# Patient Record
Sex: Female | Born: 1965
Health system: Southern US, Community
[De-identification: ages and names within clinical notes are randomized; demographics above are authoritative.]

## PROBLEM LIST (undated history)

## (undated) DIAGNOSIS — I10 Essential (primary) hypertension: Secondary | ICD-10-CM

## (undated) DIAGNOSIS — D649 Anemia, unspecified: Secondary | ICD-10-CM

## (undated) DIAGNOSIS — F319 Bipolar disorder, unspecified: Secondary | ICD-10-CM

## (undated) DIAGNOSIS — N186 End stage renal disease: Secondary | ICD-10-CM

## (undated) DIAGNOSIS — R269 Unspecified abnormalities of gait and mobility: Secondary | ICD-10-CM

## (undated) DIAGNOSIS — K219 Gastro-esophageal reflux disease without esophagitis: Secondary | ICD-10-CM

## (undated) DIAGNOSIS — F329 Major depressive disorder, single episode, unspecified: Secondary | ICD-10-CM

## (undated) DIAGNOSIS — E039 Hypothyroidism, unspecified: Secondary | ICD-10-CM

## (undated) DIAGNOSIS — F32A Depression, unspecified: Secondary | ICD-10-CM

## (undated) DIAGNOSIS — F209 Schizophrenia, unspecified: Secondary | ICD-10-CM

## (undated) DIAGNOSIS — E785 Hyperlipidemia, unspecified: Secondary | ICD-10-CM

## (undated) DIAGNOSIS — Z992 Dependence on renal dialysis: Secondary | ICD-10-CM

## (undated) DIAGNOSIS — R569 Unspecified convulsions: Secondary | ICD-10-CM

## (undated) DIAGNOSIS — N289 Disorder of kidney and ureter, unspecified: Secondary | ICD-10-CM

## (undated) DIAGNOSIS — F71 Moderate intellectual disabilities: Secondary | ICD-10-CM

## (undated) HISTORY — PX: BREAST SURGERY: SHX581

## (undated) HISTORY — PX: BREAST REDUCTION SURGERY: SHX8

## (undated) HISTORY — PX: FOOT SURGERY: SHX648

## (undated) HISTORY — DX: Gastro-esophageal reflux disease without esophagitis: K21.9

## (undated) HISTORY — DX: Unspecified abnormalities of gait and mobility: R26.9

## (undated) HISTORY — DX: End stage renal disease: N18.6

## (undated) HISTORY — PX: REDUCTION MAMMAPLASTY: SUR839

---

## 1998-02-23 ENCOUNTER — Ambulatory Visit: Admission: RE | Admit: 1998-02-23 | Discharge: 1998-02-23 | Payer: Self-pay | Admitting: Internal Medicine

## 1998-11-02 ENCOUNTER — Ambulatory Visit (HOSPITAL_COMMUNITY): Admission: RE | Admit: 1998-11-02 | Discharge: 1998-11-02 | Payer: Self-pay | Admitting: Obstetrics

## 2001-07-15 ENCOUNTER — Other Ambulatory Visit: Admission: RE | Admit: 2001-07-15 | Discharge: 2001-07-15 | Payer: Self-pay | Admitting: *Deleted

## 2001-07-16 ENCOUNTER — Ambulatory Visit (HOSPITAL_COMMUNITY): Admission: RE | Admit: 2001-07-16 | Discharge: 2001-07-16 | Payer: Self-pay | Admitting: Obstetrics and Gynecology

## 2002-04-19 ENCOUNTER — Emergency Department (HOSPITAL_COMMUNITY): Admission: EM | Admit: 2002-04-19 | Discharge: 2002-04-19 | Payer: Self-pay | Admitting: Emergency Medicine

## 2004-07-07 ENCOUNTER — Encounter: Admission: RE | Admit: 2004-07-07 | Discharge: 2004-07-07 | Payer: Self-pay | Admitting: Family Medicine

## 2005-07-31 ENCOUNTER — Encounter: Admission: RE | Admit: 2005-07-31 | Discharge: 2005-07-31 | Payer: Self-pay | Admitting: Family Medicine

## 2005-08-09 ENCOUNTER — Encounter: Admission: RE | Admit: 2005-08-09 | Discharge: 2005-08-09 | Payer: Self-pay | Admitting: Family Medicine

## 2006-02-21 ENCOUNTER — Encounter: Payer: Self-pay | Admitting: Obstetrics

## 2006-08-14 ENCOUNTER — Encounter: Admission: RE | Admit: 2006-08-14 | Discharge: 2006-08-14 | Payer: Self-pay | Admitting: Family Medicine

## 2006-10-30 ENCOUNTER — Ambulatory Visit: Payer: Self-pay | Admitting: Internal Medicine

## 2006-12-27 ENCOUNTER — Ambulatory Visit: Payer: Self-pay | Admitting: Internal Medicine

## 2007-01-29 ENCOUNTER — Ambulatory Visit: Payer: Self-pay | Admitting: Internal Medicine

## 2007-01-31 ENCOUNTER — Ambulatory Visit: Payer: Self-pay | Admitting: Internal Medicine

## 2007-02-19 ENCOUNTER — Ambulatory Visit: Payer: Self-pay | Admitting: Internal Medicine

## 2007-03-11 ENCOUNTER — Ambulatory Visit (HOSPITAL_COMMUNITY): Admission: RE | Admit: 2007-03-11 | Discharge: 2007-03-11 | Payer: Self-pay | Admitting: Internal Medicine

## 2007-03-20 ENCOUNTER — Ambulatory Visit: Payer: Self-pay | Admitting: Internal Medicine

## 2007-06-05 ENCOUNTER — Ambulatory Visit: Payer: Self-pay | Admitting: Obstetrics & Gynecology

## 2007-06-11 ENCOUNTER — Ambulatory Visit: Payer: Self-pay | Admitting: Internal Medicine

## 2007-06-11 DIAGNOSIS — N189 Chronic kidney disease, unspecified: Secondary | ICD-10-CM

## 2007-06-11 DIAGNOSIS — F79 Unspecified intellectual disabilities: Secondary | ICD-10-CM

## 2007-06-11 DIAGNOSIS — F259 Schizoaffective disorder, unspecified: Secondary | ICD-10-CM | POA: Insufficient documentation

## 2007-06-11 DIAGNOSIS — E785 Hyperlipidemia, unspecified: Secondary | ICD-10-CM

## 2007-06-11 DIAGNOSIS — Z9189 Other specified personal risk factors, not elsewhere classified: Secondary | ICD-10-CM | POA: Insufficient documentation

## 2007-06-11 DIAGNOSIS — F319 Bipolar disorder, unspecified: Secondary | ICD-10-CM | POA: Insufficient documentation

## 2007-06-11 DIAGNOSIS — E669 Obesity, unspecified: Secondary | ICD-10-CM

## 2007-06-11 DIAGNOSIS — L708 Other acne: Secondary | ICD-10-CM | POA: Insufficient documentation

## 2007-06-11 DIAGNOSIS — N3941 Urge incontinence: Secondary | ICD-10-CM

## 2007-07-02 ENCOUNTER — Encounter: Payer: Self-pay | Admitting: Obstetrics & Gynecology

## 2007-07-02 ENCOUNTER — Encounter (INDEPENDENT_AMBULATORY_CARE_PROVIDER_SITE_OTHER): Payer: Self-pay | Admitting: *Deleted

## 2007-07-02 ENCOUNTER — Ambulatory Visit (HOSPITAL_COMMUNITY): Admission: RE | Admit: 2007-07-02 | Discharge: 2007-07-02 | Payer: Self-pay | Admitting: Obstetrics & Gynecology

## 2007-07-02 ENCOUNTER — Ambulatory Visit: Payer: Self-pay | Admitting: *Deleted

## 2007-07-02 ENCOUNTER — Ambulatory Visit: Payer: Self-pay | Admitting: Obstetrics & Gynecology

## 2007-07-08 ENCOUNTER — Encounter (INDEPENDENT_AMBULATORY_CARE_PROVIDER_SITE_OTHER): Payer: Self-pay | Admitting: Internal Medicine

## 2007-07-09 ENCOUNTER — Encounter (INDEPENDENT_AMBULATORY_CARE_PROVIDER_SITE_OTHER): Payer: Self-pay | Admitting: Internal Medicine

## 2007-07-10 ENCOUNTER — Encounter (INDEPENDENT_AMBULATORY_CARE_PROVIDER_SITE_OTHER): Payer: Self-pay | Admitting: *Deleted

## 2007-07-10 ENCOUNTER — Encounter (INDEPENDENT_AMBULATORY_CARE_PROVIDER_SITE_OTHER): Payer: Self-pay | Admitting: Internal Medicine

## 2007-07-11 ENCOUNTER — Telehealth (INDEPENDENT_AMBULATORY_CARE_PROVIDER_SITE_OTHER): Payer: Self-pay | Admitting: Internal Medicine

## 2007-08-06 ENCOUNTER — Encounter (INDEPENDENT_AMBULATORY_CARE_PROVIDER_SITE_OTHER): Payer: Self-pay | Admitting: Internal Medicine

## 2007-08-12 ENCOUNTER — Encounter (INDEPENDENT_AMBULATORY_CARE_PROVIDER_SITE_OTHER): Payer: Self-pay | Admitting: Internal Medicine

## 2007-08-19 ENCOUNTER — Encounter: Admission: RE | Admit: 2007-08-19 | Discharge: 2007-08-19 | Payer: Self-pay | Admitting: Internal Medicine

## 2007-08-29 ENCOUNTER — Telehealth (INDEPENDENT_AMBULATORY_CARE_PROVIDER_SITE_OTHER): Payer: Self-pay | Admitting: Internal Medicine

## 2007-09-03 ENCOUNTER — Ambulatory Visit: Payer: Self-pay | Admitting: Internal Medicine

## 2007-09-03 DIAGNOSIS — R569 Unspecified convulsions: Secondary | ICD-10-CM | POA: Insufficient documentation

## 2007-09-08 ENCOUNTER — Encounter (INDEPENDENT_AMBULATORY_CARE_PROVIDER_SITE_OTHER): Payer: Self-pay | Admitting: Internal Medicine

## 2007-09-09 ENCOUNTER — Encounter (INDEPENDENT_AMBULATORY_CARE_PROVIDER_SITE_OTHER): Payer: Self-pay | Admitting: Internal Medicine

## 2007-10-24 ENCOUNTER — Encounter (INDEPENDENT_AMBULATORY_CARE_PROVIDER_SITE_OTHER): Payer: Self-pay | Admitting: Internal Medicine

## 2007-11-04 ENCOUNTER — Telehealth (INDEPENDENT_AMBULATORY_CARE_PROVIDER_SITE_OTHER): Payer: Self-pay | Admitting: Internal Medicine

## 2007-12-02 ENCOUNTER — Encounter (INDEPENDENT_AMBULATORY_CARE_PROVIDER_SITE_OTHER): Payer: Self-pay | Admitting: Internal Medicine

## 2007-12-03 ENCOUNTER — Ambulatory Visit: Payer: Self-pay | Admitting: Internal Medicine

## 2007-12-03 DIAGNOSIS — D509 Iron deficiency anemia, unspecified: Secondary | ICD-10-CM

## 2007-12-09 LAB — CONVERTED CEMR LAB
Basophils Relative: 0 % (ref 0–1)
Eosinophils Absolute: 0 10*3/uL (ref 0.0–0.7)
Eosinophils Relative: 1 % (ref 0–5)
Hemoglobin: 11.8 g/dL — ABNORMAL LOW (ref 12.0–15.0)
MCHC: 30.6 g/dL (ref 30.0–36.0)
Neutro Abs: 3.7 10*3/uL (ref 1.7–7.7)

## 2007-12-25 ENCOUNTER — Telehealth (INDEPENDENT_AMBULATORY_CARE_PROVIDER_SITE_OTHER): Payer: Self-pay | Admitting: *Deleted

## 2007-12-31 ENCOUNTER — Encounter (INDEPENDENT_AMBULATORY_CARE_PROVIDER_SITE_OTHER): Payer: Self-pay | Admitting: Internal Medicine

## 2008-01-10 ENCOUNTER — Encounter (INDEPENDENT_AMBULATORY_CARE_PROVIDER_SITE_OTHER): Payer: Self-pay | Admitting: Internal Medicine

## 2008-01-17 ENCOUNTER — Ambulatory Visit: Payer: Self-pay | Admitting: Internal Medicine

## 2008-01-20 ENCOUNTER — Ambulatory Visit: Payer: Self-pay | Admitting: Internal Medicine

## 2008-02-06 ENCOUNTER — Encounter (INDEPENDENT_AMBULATORY_CARE_PROVIDER_SITE_OTHER): Payer: Self-pay | Admitting: Internal Medicine

## 2008-03-02 ENCOUNTER — Encounter (INDEPENDENT_AMBULATORY_CARE_PROVIDER_SITE_OTHER): Payer: Self-pay | Admitting: Internal Medicine

## 2008-04-02 ENCOUNTER — Encounter (INDEPENDENT_AMBULATORY_CARE_PROVIDER_SITE_OTHER): Payer: Self-pay | Admitting: Internal Medicine

## 2008-04-16 ENCOUNTER — Ambulatory Visit: Payer: Self-pay | Admitting: Internal Medicine

## 2008-04-16 LAB — CONVERTED CEMR LAB
Alkaline Phosphatase: 62 units/L (ref 39–117)
Basophils Absolute: 0 10*3/uL (ref 0.0–0.1)
Basophils Relative: 0 % (ref 0–1)
CO2: 23 meq/L (ref 19–32)
Calcium: 9.7 mg/dL (ref 8.4–10.5)
Chloride: 105 meq/L (ref 96–112)
Cholesterol: 159 mg/dL (ref 0–200)
Eosinophils Absolute: 0.1 10*3/uL (ref 0.0–0.7)
Eosinophils Relative: 1 % (ref 0–5)
HCT: 36.9 % (ref 36.0–46.0)
HDL: 36 mg/dL — ABNORMAL LOW (ref >39)
Hemoglobin: 11.7 g/dL — ABNORMAL LOW (ref 12.0–15.0)
Lymphs Abs: 2.6 10*3/uL (ref 0.7–4.0)
Monocytes Absolute: 0.4 10*3/uL (ref 0.1–1.0)
Neutro Abs: 3.5 10*3/uL (ref 1.7–7.7)
Platelets: 286 10*3/uL (ref 150–400)
Potassium: 4 meq/L (ref 3.5–5.3)
RBC: 3.91 M/uL (ref 3.87–5.11)
Total Bilirubin: 0.3 mg/dL (ref 0.3–1.2)
Total CHOL/HDL Ratio: 4.4
Triglycerides: 118 mg/dL (ref <150)
Valproic Acid Lvl: 51.5 ug/mL (ref 50.0–100.0)

## 2008-04-30 ENCOUNTER — Encounter (INDEPENDENT_AMBULATORY_CARE_PROVIDER_SITE_OTHER): Payer: Self-pay | Admitting: Internal Medicine

## 2008-05-07 ENCOUNTER — Encounter (INDEPENDENT_AMBULATORY_CARE_PROVIDER_SITE_OTHER): Payer: Self-pay | Admitting: Internal Medicine

## 2008-06-01 ENCOUNTER — Encounter (INDEPENDENT_AMBULATORY_CARE_PROVIDER_SITE_OTHER): Payer: Self-pay | Admitting: Internal Medicine

## 2008-07-07 ENCOUNTER — Encounter (INDEPENDENT_AMBULATORY_CARE_PROVIDER_SITE_OTHER): Payer: Self-pay | Admitting: Internal Medicine

## 2008-07-09 ENCOUNTER — Emergency Department (HOSPITAL_COMMUNITY): Admission: EM | Admit: 2008-07-09 | Discharge: 2008-07-09 | Payer: Self-pay | Admitting: Family Medicine

## 2008-08-19 ENCOUNTER — Encounter: Admission: RE | Admit: 2008-08-19 | Discharge: 2008-08-19 | Payer: Self-pay | Admitting: Internal Medicine

## 2008-09-07 ENCOUNTER — Encounter (INDEPENDENT_AMBULATORY_CARE_PROVIDER_SITE_OTHER): Payer: Self-pay | Admitting: Internal Medicine

## 2008-09-09 ENCOUNTER — Encounter (INDEPENDENT_AMBULATORY_CARE_PROVIDER_SITE_OTHER): Payer: Self-pay | Admitting: Internal Medicine

## 2008-10-07 ENCOUNTER — Encounter (INDEPENDENT_AMBULATORY_CARE_PROVIDER_SITE_OTHER): Payer: Self-pay | Admitting: Internal Medicine

## 2008-10-22 ENCOUNTER — Encounter (INDEPENDENT_AMBULATORY_CARE_PROVIDER_SITE_OTHER): Payer: Self-pay | Admitting: Internal Medicine

## 2008-10-27 ENCOUNTER — Telehealth (INDEPENDENT_AMBULATORY_CARE_PROVIDER_SITE_OTHER): Payer: Self-pay | Admitting: Internal Medicine

## 2008-11-04 ENCOUNTER — Telehealth (INDEPENDENT_AMBULATORY_CARE_PROVIDER_SITE_OTHER): Payer: Self-pay | Admitting: Internal Medicine

## 2008-11-09 ENCOUNTER — Encounter (INDEPENDENT_AMBULATORY_CARE_PROVIDER_SITE_OTHER): Payer: Self-pay | Admitting: Internal Medicine

## 2008-11-22 ENCOUNTER — Encounter (INDEPENDENT_AMBULATORY_CARE_PROVIDER_SITE_OTHER): Payer: Self-pay | Admitting: Internal Medicine

## 2008-12-09 ENCOUNTER — Encounter (INDEPENDENT_AMBULATORY_CARE_PROVIDER_SITE_OTHER): Payer: Self-pay | Admitting: Internal Medicine

## 2008-12-14 ENCOUNTER — Encounter (INDEPENDENT_AMBULATORY_CARE_PROVIDER_SITE_OTHER): Payer: Self-pay | Admitting: Internal Medicine

## 2009-01-04 ENCOUNTER — Encounter (INDEPENDENT_AMBULATORY_CARE_PROVIDER_SITE_OTHER): Payer: Self-pay | Admitting: Internal Medicine

## 2009-01-11 ENCOUNTER — Encounter (INDEPENDENT_AMBULATORY_CARE_PROVIDER_SITE_OTHER): Payer: Self-pay | Admitting: Internal Medicine

## 2009-01-21 ENCOUNTER — Ambulatory Visit: Payer: Self-pay | Admitting: Internal Medicine

## 2009-01-21 LAB — CONVERTED CEMR LAB
Bilirubin Urine: NEGATIVE
Blood in Urine, dipstick: NEGATIVE
pH: 6

## 2009-01-25 ENCOUNTER — Encounter (INDEPENDENT_AMBULATORY_CARE_PROVIDER_SITE_OTHER): Payer: Self-pay | Admitting: Internal Medicine

## 2009-02-03 ENCOUNTER — Telehealth (INDEPENDENT_AMBULATORY_CARE_PROVIDER_SITE_OTHER): Payer: Self-pay | Admitting: Internal Medicine

## 2009-02-05 ENCOUNTER — Ambulatory Visit: Payer: Self-pay | Admitting: Internal Medicine

## 2009-02-05 LAB — CONVERTED CEMR LAB
ALT: 9 units/L (ref 0–35)
Albumin: 3.8 g/dL (ref 3.5–5.2)
BUN: 26 mg/dL — ABNORMAL HIGH (ref 6–23)
Basophils Relative: 1 % (ref 0–1)
CO2: 23 meq/L (ref 19–32)
Cholesterol: 123 mg/dL (ref 0–200)
Creatinine, Ser: 1.86 mg/dL — ABNORMAL HIGH (ref 0.40–1.20)
HCT: 36.8 % (ref 36.0–46.0)
LDL Cholesterol: 70 mg/dL (ref 0–99)
Lymphocytes Relative: 36 % (ref 12–46)
Lymphs Abs: 2 10*3/uL (ref 0.7–4.0)
Monocytes Absolute: 0.5 10*3/uL (ref 0.1–1.0)
Monocytes Relative: 8 % (ref 3–12)
Neutro Abs: 3.1 10*3/uL (ref 1.7–7.7)
Neutrophils Relative %: 55 % (ref 43–77)
RDW: 13.7 % (ref 11.5–15.5)
Total Bilirubin: 0.3 mg/dL (ref 0.3–1.2)
Triglycerides: 71 mg/dL (ref <150)

## 2009-02-08 ENCOUNTER — Ambulatory Visit: Payer: Self-pay | Admitting: Internal Medicine

## 2009-02-14 ENCOUNTER — Encounter (INDEPENDENT_AMBULATORY_CARE_PROVIDER_SITE_OTHER): Payer: Self-pay | Admitting: Internal Medicine

## 2009-03-23 ENCOUNTER — Telehealth (INDEPENDENT_AMBULATORY_CARE_PROVIDER_SITE_OTHER): Payer: Self-pay | Admitting: Internal Medicine

## 2009-03-25 ENCOUNTER — Encounter (INDEPENDENT_AMBULATORY_CARE_PROVIDER_SITE_OTHER): Payer: Self-pay | Admitting: Internal Medicine

## 2009-03-29 ENCOUNTER — Encounter (INDEPENDENT_AMBULATORY_CARE_PROVIDER_SITE_OTHER): Payer: Self-pay | Admitting: Internal Medicine

## 2009-04-02 ENCOUNTER — Telehealth (INDEPENDENT_AMBULATORY_CARE_PROVIDER_SITE_OTHER): Payer: Self-pay | Admitting: Internal Medicine

## 2009-04-16 ENCOUNTER — Ambulatory Visit: Payer: Self-pay | Admitting: Internal Medicine

## 2009-04-16 ENCOUNTER — Telehealth (INDEPENDENT_AMBULATORY_CARE_PROVIDER_SITE_OTHER): Payer: Self-pay | Admitting: Internal Medicine

## 2009-04-16 DIAGNOSIS — R631 Polydipsia: Secondary | ICD-10-CM

## 2009-04-16 DIAGNOSIS — R350 Frequency of micturition: Secondary | ICD-10-CM

## 2009-04-16 DIAGNOSIS — N898 Other specified noninflammatory disorders of vagina: Secondary | ICD-10-CM | POA: Insufficient documentation

## 2009-04-16 DIAGNOSIS — B353 Tinea pedis: Secondary | ICD-10-CM | POA: Insufficient documentation

## 2009-04-16 DIAGNOSIS — G4700 Insomnia, unspecified: Secondary | ICD-10-CM

## 2009-04-16 LAB — CONVERTED CEMR LAB
BUN: 23 mg/dL (ref 6–23)
Bilirubin Urine: NEGATIVE
Chlamydia, Swab/Urine, PCR: NEGATIVE
Chloride: 103 meq/L (ref 96–112)
Glucose, Bld: 79 mg/dL (ref 70–99)
Ketones, urine, test strip: NEGATIVE
Nitrite: NEGATIVE
Potassium: 4.1 meq/L (ref 3.5–5.3)
Specific Gravity, Urine: <1.005
Urobilinogen, UA: 0.2
WBC Urine, dipstick: NEGATIVE
pH: 5.5

## 2009-04-22 ENCOUNTER — Encounter (INDEPENDENT_AMBULATORY_CARE_PROVIDER_SITE_OTHER): Payer: Self-pay | Admitting: Internal Medicine

## 2009-06-17 ENCOUNTER — Ambulatory Visit: Payer: Self-pay | Admitting: Internal Medicine

## 2009-06-17 DIAGNOSIS — R635 Abnormal weight gain: Secondary | ICD-10-CM | POA: Insufficient documentation

## 2009-06-17 DIAGNOSIS — IMO0002 Reserved for concepts with insufficient information to code with codable children: Secondary | ICD-10-CM

## 2009-08-02 ENCOUNTER — Telehealth (INDEPENDENT_AMBULATORY_CARE_PROVIDER_SITE_OTHER): Payer: Self-pay | Admitting: Internal Medicine

## 2009-08-09 ENCOUNTER — Telehealth (INDEPENDENT_AMBULATORY_CARE_PROVIDER_SITE_OTHER): Payer: Self-pay | Admitting: Internal Medicine

## 2009-08-20 ENCOUNTER — Ambulatory Visit (HOSPITAL_COMMUNITY): Admission: RE | Admit: 2009-08-20 | Discharge: 2009-08-20 | Payer: Self-pay | Admitting: Internal Medicine

## 2009-09-08 ENCOUNTER — Ambulatory Visit: Payer: Self-pay | Admitting: Internal Medicine

## 2009-10-25 ENCOUNTER — Encounter: Admission: RE | Admit: 2009-10-25 | Discharge: 2010-01-23 | Payer: Self-pay | Admitting: Internal Medicine

## 2009-10-25 ENCOUNTER — Encounter (INDEPENDENT_AMBULATORY_CARE_PROVIDER_SITE_OTHER): Payer: Self-pay | Admitting: Internal Medicine

## 2009-11-30 ENCOUNTER — Ambulatory Visit: Payer: Self-pay | Admitting: Internal Medicine

## 2010-02-20 DIAGNOSIS — S93409A Sprain of unspecified ligament of unspecified ankle, initial encounter: Secondary | ICD-10-CM | POA: Insufficient documentation

## 2010-02-21 ENCOUNTER — Ambulatory Visit: Payer: Self-pay | Admitting: Internal Medicine

## 2010-03-07 ENCOUNTER — Encounter: Admission: RE | Admit: 2010-03-07 | Discharge: 2010-03-07 | Payer: Self-pay | Admitting: Podiatry

## 2010-03-16 ENCOUNTER — Telehealth (INDEPENDENT_AMBULATORY_CARE_PROVIDER_SITE_OTHER): Payer: Self-pay | Admitting: Nurse Practitioner

## 2010-04-12 ENCOUNTER — Ambulatory Visit: Payer: Self-pay | Admitting: Internal Medicine

## 2010-04-12 LAB — CONVERTED CEMR LAB
Nitrite: NEGATIVE
Urobilinogen, UA: 0.2

## 2010-04-15 ENCOUNTER — Encounter (INDEPENDENT_AMBULATORY_CARE_PROVIDER_SITE_OTHER): Payer: Self-pay | Admitting: Internal Medicine

## 2010-04-20 LAB — CONVERTED CEMR LAB
Albumin: 3.8 g/dL (ref 3.5–5.2)
Basophils Absolute: 0 10*3/uL (ref 0.0–0.1)
Basophils Relative: 0 % (ref 0–1)
CO2: 23 meq/L (ref 19–32)
Calcium: 9.8 mg/dL (ref 8.4–10.5)
Chlamydia, Swab/Urine, PCR: NEGATIVE
Chloride: 103 meq/L (ref 96–112)
Cholesterol: 203 mg/dL — ABNORMAL HIGH (ref 0–200)
GC Probe Amp, Urine: NEGATIVE
HCT: 35.2 % — ABNORMAL LOW (ref 36.0–46.0)
HDL: 36 mg/dL — ABNORMAL LOW (ref >39)
LDL Cholesterol: 124 mg/dL — ABNORMAL HIGH (ref 0–99)
Lymphocytes Relative: 39 % (ref 12–46)
Lymphs Abs: 1.9 10*3/uL (ref 0.7–4.0)
Monocytes Absolute: 0.4 10*3/uL (ref 0.1–1.0)
Monocytes Relative: 7 % (ref 3–12)
Platelets: 333 10*3/uL (ref 150–400)
Potassium: 3.8 meq/L (ref 3.5–5.3)
RBC: 3.57 M/uL — ABNORMAL LOW (ref 3.87–5.11)
RDW: 14.6 % (ref 11.5–15.5)
Sodium: 140 meq/L (ref 135–145)
Total Bilirubin: 0.3 mg/dL (ref 0.3–1.2)
Total CHOL/HDL Ratio: 5.6
Triglycerides: 215 mg/dL — ABNORMAL HIGH (ref <150)
VLDL: 43 mg/dL — ABNORMAL HIGH (ref 0–40)

## 2010-04-21 LAB — CONVERTED CEMR LAB
Iron: 96 ug/dL (ref 42–145)
Saturation Ratios: 38 % (ref 20–55)
Vitamin B-12: 398 pg/mL (ref 211–911)

## 2010-05-16 ENCOUNTER — Ambulatory Visit: Payer: Self-pay | Admitting: Internal Medicine

## 2010-05-17 ENCOUNTER — Encounter (INDEPENDENT_AMBULATORY_CARE_PROVIDER_SITE_OTHER): Payer: Self-pay | Admitting: Internal Medicine

## 2010-05-21 ENCOUNTER — Telehealth (INDEPENDENT_AMBULATORY_CARE_PROVIDER_SITE_OTHER): Payer: Self-pay | Admitting: Internal Medicine

## 2010-05-21 ENCOUNTER — Encounter (INDEPENDENT_AMBULATORY_CARE_PROVIDER_SITE_OTHER): Payer: Self-pay | Admitting: Internal Medicine

## 2010-05-21 LAB — CONVERTED CEMR LAB
BUN: 32 mg/dL — ABNORMAL HIGH (ref 6–23)
CO2: 22 meq/L (ref 19–32)
Chloride: 103 meq/L (ref 96–112)
Creatinine 24 HR UR: 1138 mg/24hr (ref 700–1800)
Creatinine Clearance: 34 mL/min — ABNORMAL LOW (ref 75–115)
Creatinine, Ser: 2.3 mg/dL — ABNORMAL HIGH (ref 0.40–1.20)
Creatinine, Urine: 35.6 mg/dL
Potassium: 3.9 meq/L (ref 3.5–5.3)

## 2010-05-25 ENCOUNTER — Encounter (INDEPENDENT_AMBULATORY_CARE_PROVIDER_SITE_OTHER): Payer: Self-pay | Admitting: Internal Medicine

## 2010-05-31 ENCOUNTER — Ambulatory Visit: Payer: Self-pay | Admitting: Internal Medicine

## 2010-06-01 ENCOUNTER — Ambulatory Visit (HOSPITAL_COMMUNITY): Admission: RE | Admit: 2010-06-01 | Discharge: 2010-06-01 | Payer: Self-pay | Admitting: Internal Medicine

## 2010-06-09 LAB — CONVERTED CEMR LAB: IgG (Immunoglobin G), Serum: 2290 mg/dL — ABNORMAL HIGH (ref 694–1618)

## 2010-07-13 ENCOUNTER — Encounter (INDEPENDENT_AMBULATORY_CARE_PROVIDER_SITE_OTHER): Payer: Self-pay | Admitting: Internal Medicine

## 2010-07-14 ENCOUNTER — Ambulatory Visit: Payer: Self-pay | Admitting: Internal Medicine

## 2010-08-02 ENCOUNTER — Telehealth (INDEPENDENT_AMBULATORY_CARE_PROVIDER_SITE_OTHER): Payer: Self-pay | Admitting: Internal Medicine

## 2010-08-09 ENCOUNTER — Ambulatory Visit: Payer: Self-pay | Admitting: Internal Medicine

## 2010-08-22 ENCOUNTER — Ambulatory Visit (HOSPITAL_COMMUNITY): Admission: RE | Admit: 2010-08-22 | Discharge: 2010-08-22 | Payer: Self-pay | Admitting: Internal Medicine

## 2010-08-29 LAB — CONVERTED CEMR LAB
ALT: <8 units/L (ref 0–35)
BUN: 33 mg/dL — ABNORMAL HIGH (ref 6–23)
CO2: 23 meq/L (ref 19–32)
Chloride: 105 meq/L (ref 96–112)
Cholesterol: 214 mg/dL — ABNORMAL HIGH (ref 0–200)
Creatinine, Ser: 2.39 mg/dL — ABNORMAL HIGH (ref 0.40–1.20)
Eosinophils Absolute: 0.1 10*3/uL (ref 0.0–0.7)
Eosinophils Relative: 2 % (ref 0–5)
Glucose, Bld: 86 mg/dL (ref 70–99)
HCT: 35 % — ABNORMAL LOW (ref 36.0–46.0)
Indirect Bilirubin: 0.1 mg/dL (ref 0.0–0.9)
Lymphocytes Relative: 39 % (ref 12–46)
MCHC: 32 g/dL (ref 30.0–36.0)
Monocytes Relative: 7 % (ref 3–12)
Neutro Abs: 3.3 10*3/uL (ref 1.7–7.7)
Neutrophils Relative %: 52 % (ref 43–77)
Platelets: 309 10*3/uL (ref 150–400)
Potassium: 4.3 meq/L (ref 3.5–5.3)
RBC: 3.7 M/uL — ABNORMAL LOW (ref 3.87–5.11)
RDW: 13.5 % (ref 11.5–15.5)
Total CHOL/HDL Ratio: 5.6
Triglycerides: 123 mg/dL (ref <150)
Valproic Acid Lvl: 49.3 ug/mL — ABNORMAL LOW (ref 50.0–100.0)
WBC: 6.3 10*3/uL (ref 4.0–10.5)

## 2010-09-12 ENCOUNTER — Telehealth (INDEPENDENT_AMBULATORY_CARE_PROVIDER_SITE_OTHER): Payer: Self-pay | Admitting: Internal Medicine

## 2010-10-04 ENCOUNTER — Ambulatory Visit: Payer: Self-pay | Admitting: Internal Medicine

## 2010-10-04 DIAGNOSIS — J209 Acute bronchitis, unspecified: Secondary | ICD-10-CM | POA: Insufficient documentation

## 2010-10-11 ENCOUNTER — Ambulatory Visit: Payer: Self-pay | Admitting: Internal Medicine

## 2010-10-11 ENCOUNTER — Encounter (INDEPENDENT_AMBULATORY_CARE_PROVIDER_SITE_OTHER): Payer: Self-pay | Admitting: Internal Medicine

## 2010-10-11 DIAGNOSIS — I10 Essential (primary) hypertension: Secondary | ICD-10-CM

## 2010-10-11 DIAGNOSIS — M79609 Pain in unspecified limb: Secondary | ICD-10-CM | POA: Insufficient documentation

## 2010-10-11 LAB — CONVERTED CEMR LAB
Calcium: 9.8 mg/dL (ref 8.4–10.5)
Chloride: 102 meq/L (ref 96–112)
Glucose, Bld: 83 mg/dL (ref 70–99)
Sodium: 140 meq/L (ref 135–145)
Uric Acid, Serum: 13.6 mg/dL — ABNORMAL HIGH (ref 2.4–7.0)

## 2010-10-12 ENCOUNTER — Encounter (INDEPENDENT_AMBULATORY_CARE_PROVIDER_SITE_OTHER): Payer: Self-pay | Admitting: Nurse Practitioner

## 2010-10-12 ENCOUNTER — Telehealth (INDEPENDENT_AMBULATORY_CARE_PROVIDER_SITE_OTHER): Payer: Self-pay | Admitting: Internal Medicine

## 2010-10-26 ENCOUNTER — Telehealth (INDEPENDENT_AMBULATORY_CARE_PROVIDER_SITE_OTHER): Payer: Self-pay | Admitting: Internal Medicine

## 2010-11-04 ENCOUNTER — Ambulatory Visit
Admission: RE | Admit: 2010-11-04 | Discharge: 2010-11-04 | Payer: Self-pay | Source: Home / Self Care | Attending: Internal Medicine | Admitting: Internal Medicine

## 2010-11-12 ENCOUNTER — Encounter: Payer: Self-pay | Admitting: Family Medicine

## 2010-11-22 NOTE — Letter (Signed)
Summary: MED/SOLUTIONS//APPROVED  MED/SOLUTIONS//APPROVED   Imported By: Roland Earl 06/08/2010 09:35:06  _____________________________________________________________________  External Attachment:    Type:   Image     Comment:   External Document

## 2010-11-22 NOTE — Progress Notes (Signed)
Summary: Nephrology Referral   Phone Note Outgoing Call   Summary of Call: Jane [name]:  Nephrology referral--order and letter written--labs to be done need to be faxed ultimately as well.  I have flagged labs and Ultrasound in paper chart to fax along as well.  Please also fax the copy of her flowsheet I placed with paper chart on your desk. Initial call taken by: Mack Hook MD,  May 21, 2010 1:29 PM  Follow-up for Phone Call        I send the Referral today [facility] with all your information .Waiting for an appt  And also pt have an appt for her Ultrasound 06-01-10 @ 1:30pm  Follow-up by: Maren Reamer,  May 25, 2010 12:03 PM

## 2010-11-22 NOTE — Assessment & Plan Note (Signed)
Summary: CPP////KT   Vital Signs:  Patient profile:   45 year old female Weight:      220 pounds BMI:     37.90 Temp:     97.5 degrees F oral Pulse rate:   87 / minute Pulse rhythm:   regular Resp:     22 per minute BP sitting:   133 / 88  (left arm) Cuff size:   large  Vitals Entered By: Shellia Carwin CMA (April 12, 2010 9:06 AM) CC: CPP Is Patient Diabetic? No  Does patient need assistance? Ambulation Normal   CC:  CPP.  History of Present Illness: 45 yo female here for CPP.  Concerns:  none.  Sister accompanies and states they have cut her juice intake back and she has lost weight.    Current Medications (verified): 1)  Depakote Er 500 Mg  Tb24 (Divalproex Sodium) .... Take 2 Tablets At Bed Time 2)  Risperdal 3 Mg  Tabs (Risperidone) .... Take 1 Tablet By Mouth Two Times A Day 3)  Cleocin-T 1 % Gel (Clindamycin Phosphate) .... Apply To Face Twice Daily 4)  Systane 0.4-0.3 %  Soln (Polyethyl Glycol-Propyl Glycol) .Marland Kitchen.. 1 Drop Each Eye Every 3 Hours As Needed 5)  Ferrous Sulfate 325 (65 Fe) Mg  Tabs (Ferrous Sulfate) .... Take 1 Tablet By Mouth Two Times Daily With 8oz of Orange Juice 6)  Chlorhexidine Gluconate 0.12 %  Soln (Chlorhexidine Gluconate) .Marland Kitchen.. 1 Tsp Swish and Spit Three Times A Day 7)  Depo-Provera 150 Mg/ml Im Susp (Medroxyprogesterone Acetate) .... Im Every 3 Months At Office 8)  Fluticasone Propionate 50 Mcg/act Susp (Fluticasone Propionate) .... 2 Sprays Each Nostril Daily 9)  Gemfibrozil 600 Mg Tabs (Gemfibrozil) .Marland Kitchen.. 1 Tab By Mouth Two Times A Day 10)  Lamisil At 1 % Crea (Terbinafine Hcl) .... Apply Two Times A Day To Feet For 14 Days or Until Feet Healed. 11)  Vesicare 5 Mg Tabs (Solifenacin Succinate) .Marland Kitchen.. 1 Tab By Mouth Daily  Allergies (verified): No Known Drug Allergies  Past History:  Past Surgical History: Reviewed history from 01/21/2009 and no changes required. 1.  Reduction Mammoplasty.  Family History: Mother, died 43:  died of breast  cancer Father, 80:  Healthy Brother, died in 24s:  possibly from unknown type cancer. Sister, 68:  Healthy Brother, 70:  healthy No children.  Social History: Lived at [facility] group home since 1990. Moved in with sister and her family end of April 2010. Currently lives with sister and her husband and their 2 children--early 37s and one of sister's grandchildren Sister runs a daycare out of her home--7 children. Deborah's [employer]. Pt participates somewhat in daycare. Never Smoked Alcohol use-no Drug use-no  Review of Systems General:  Energy is okay. Eyes:  Does not read much.  States she sees fine.  . ENT:  Denies decreased hearing. CV:  Denies chest pain or discomfort and palpitations. Resp:  Denies shortness of breath. GI:  Denies abdominal pain, bloody stools, constipation, dark tarry stools, and diarrhea. GU:  Denies discharge, dysuria, and urinary frequency; No periods--on Depo provera. MS:  Denies joint pain, joint redness, and joint swelling; Recent right sprained ankle Last week had a swollen left arm upon awakening--was swollen for 2 days--did not recall any injury.. Derm:  Denies lesion(s) and rash. Neuro:  Denies numbness, tingling, and weakness. Psych:  Denies anxiety and depression.  Physical Exam  General:  Overweight female, NAD.   Head:  Normocephalic and atraumatic without obvious abnormalities.  No apparent alopecia or balding. Eyes:  No corneal or conjunctival inflammation noted. EOMI. Perrla. Funduscopic exam benign, without hemorrhages, exudates or papilledema. Vision grossly normal. Ears:  External ear exam shows no significant lesions or deformities.  Otoscopic examination reveals clear canals, tympanic membranes are intact bilaterally without bulging, retraction, inflammation or discharge. Hearing is grossly normal bilaterally. Nose:  External nasal examination shows no deformity or inflammation. Nasal mucosa are pink and moist without lesions  or exudates. Mouth:  Oral mucosa and oropharynx without lesions or exudates.  Teeth in good repair. Neck:  No deformities, masses, or tenderness noted. Breasts:  No mass, nodules, thickening, tenderness, bulging, retraction, inflamation, nipple discharge or skin changes noted.  Surgical scars from reduction mammoplasty Lungs:  Normal respiratory effort, chest expands symmetrically. Lungs are clear to auscultation, no crackles or wheezes. Heart:  Normal rate and regular rhythm. S1 and S2 normal without gallop, murmur, click, rub or other extra sounds. Abdomen:  Bowel sounds positive,abdomen soft and non-tender without masses, organomegaly or hernias noted. Rectal:  Refused by patient Genitalia:  Refused by patient Msk:  No deformity or scoliosis noted of thoracic or lumbar spine.   Pulses:  R and L carotid,radial,femoral,dorsalis pedis and posterior tibial pulses are full and equal bilaterally Extremities:  No clubbing, cyanosis, edema, or deformity noted with normal full range of motion of all joints.   Bit of residual swelling about right ankle.  Nontender Neurologic:  No cranial nerve deficits noted. Station and gait are normal. Plantar reflexes are down-going bilaterally. DTRs are symmetrical throughout. Sensory, motor and coordinative functions appear intact. Skin:  Intact without suspicious lesions or rashes Cervical Nodes:  No lymphadenopathy noted Axillary Nodes:  No palpable lymphadenopathy Inguinal Nodes:  No significant adenopathy Psych:  Cognition and judgment appear intact. Alert and cooperative with normal attention span and concentration. No apparent delusions, illusions, hallucinations   Impression & Recommendations:  Problem # 1:  ROUTINE GYNECOLOGICAL EXAMINATION (ICD-V72.31)  Mammogram due in October. Pt. adamantly refused Pap, pelvic and rectal  Orders: T-Chlamydia  Probe, urine [id number]) T-GC Probe, urine [id number]) T-Syphilis Test (RPR) [phone]) T-HIV  Antibody  (Reflex) [id number])  Problem # 2:  CONTRACEPTIVE MANAGEMENT (ICD-V25.09) continue with Depo provera  Problem # 3:  Preventive Health Care (ICD-V70.0) Hemoccult cards x3 to return in 2 weeks Encouraged yearly flu vaccine Td up to date INcrease dairy intake and exercise  Complete Medication List: 1)  Depakote Er 500 Mg Tb24 (Divalproex sodium) .... 3 tabs by mouth at [facility]--[facility]. 2)  Risperdal 3 Mg Tabs (Risperidone) .Marland Kitchen.. 1 tab by mouth at bedtime--[facility]  [place]. 3)  Cleocin-t 1 % Gel (Clindamycin phosphate) .... Apply to face twice daily 4)  Systane 0.4-0.3 % Soln (Polyethyl glycol-propyl glycol) .Marland Kitchen.. 1 drop each eye every 3 hours as needed 5)  Ferrous Sulfate 325 (65 Fe) Mg Tabs (Ferrous sulfate) .... Take 1 tablet by mouth two times daily with 8oz of orange juice 6)  Chlorhexidine Gluconate 0.12 % Soln (Chlorhexidine gluconate) .Marland Kitchen.. 1 tsp swish and spit three times a day 7)  Depo-provera 150 Mg/ml Im Susp (Medroxyprogesterone acetate) .... Im every 3 months at office 8)  Fluticasone Propionate 50 Mcg/act Susp (Fluticasone propionate) .... 2 sprays each nostril daily 9)  Gemfibrozil 600 Mg Tabs (Gemfibrozil) .Marland Kitchen.. 1 tab by mouth two times a day 10)  Vesicare 5 Mg Tabs (Solifenacin succinate) .Marland Kitchen.. 1 tab by mouth daily 11)  Depo-provera 150 Mg/ml Susp (Medroxyprogesterone acetate) .Marland KitchenMarland KitchenMarland Kitchen 150 mg im once  every 3 months  Other Orders: UA Dipstick w/o Micro (manual) [id number]) T-Comprehensive Metabolic Panel ([id number]) T-CBC w/Diff [id number]) T-Lipid Profile [id number]) T-Valproic Acid (Depakene) [id number])  Patient Instructions: 1)  Follow up with Dr. Amil Amen in 6 months --general follow up  Preventive Care Screening  Prior Values:    PPD:  negative (01/20/2008)    Pap Smear:  Normal. (07/09/2007)    Mammogram:  ASSESSMENT: Negative - BI-RADS 1^MM DIGITAL SCREENING (08/20/2009)    Hemoccult:  Pt. refuses to cooperate  (09/07/2007)    Last Tetanus Booster:  Historical (10/30/2006)    Last Flu Shot:  given (08/23/2008)     Guaiac Cards:  never--pt. always flushed stool before could test. Colonoscopy:  never Osteoprevention:  1 cup of milk or yogurt, cheese daily.  Little in way of physical activity.  Sister trying to get her to walk. Mammogram:  Due in October 2011. SBE:  Pt. states she does perform.  Prescriptions: DEPO-PROVERA 150 MG/ML IM SUSP (MEDROXYPROGESTERONE ACETATE) Im every 3 months at office  #1 x 3   Entered and Authorized by:   Mack Hook MD   Signed by:   Mack Hook MD on 04/12/2010   Method used:   Electronically to        [facility]. # [id number]* (retail)       [address]       [place], [place]  [address]       Ph: [phone] or [phone]       Fax: [phone]   RxID:   [id number] VESICARE 5 MG TABS (SOLIFENACIN SUCCINATE) 1 tab by mouth daily  #30 x 11   Entered and Authorized by:   Mack Hook MD   Signed by:   Mack Hook MD on 04/12/2010   Method used:   Electronically to        [facility]. # [id number]* (retail)       [place]       [place], [place]  [address]       Ph: [phone] or [phone]       Fax: [phone]   RxID:   [id number] GEMFIBROZIL 600 MG TABS (GEMFIBROZIL) 1 tab by mouth two times a day  #60 Tablet x 11   Entered and Authorized by:   Mack Hook MD   Signed by:   Mack Hook MD on 04/12/2010   Method used:   Electronically to        [facility]. # [id number]* (retail)       [place]       [place], [place]  [address]       Ph: [phone] or [phone]       Fax: [phone]   RxID:   [id number] FLUTICASONE PROPIONATE 50 MCG/ACT SUSP (FLUTICASONE PROPIONATE) 2 sprays each nostril daily  #1 x 11   Entered and Authorized by:   Mack Hook MD   Signed by:   Mack Hook MD on  04/12/2010   Method used:   Electronically to        [facility]. # [id number]* (retail)       [facility]       [place], [place]  [address]       Ph: [phone] or [phone]  Fax: [phone]   [id number] CHLORHEXIDINE GLUCONATE 0.12 %  SOLN (CHLORHEXIDINE GLUCONATE) 1 tsp swish and spit three times a day  #1 month x 11   Entered and Authorized by:   Mack Hook MD   Signed by:   Mack Hook MD on 04/12/2010   Method used:   Electronically to        [facility]. # [id number]* (retail)       [facility] [place]       [place], [place]  [address]       Ph: [phone] or [phone]       Fax: [phone]   RxID:   [id number] FERROUS SULFATE 325 (65 FE) MG  TABS (FERROUS SULFATE) take 1 tablet by mouth two times daily with 8oz of orange juice  #60 x 6   Entered and Authorized by:   Mack Hook MD   Signed by:   Mack Hook MD on 04/12/2010   Method used:   Electronically to        [facility]. # [id number]* (retail)       [facility] [place]       [place], [place]  [address]       Ph: [phone] or [phone]       Fax: [phone]   RxID:   [id number] SYSTANE 0.4-0.3 %  SOLN (POLYETHYL GLYCOL-PROPYL GLYCOL) 1 drop each eye every 3 hours as needed  #1 month x 11   Entered and Authorized by:   Mack Hook MD   Signed by:   Mack Hook MD on 04/12/2010   Method used:   Electronically to        [facility]. # [id number]* (retail)       [facility]       [place], [place]  [address]       Ph: [phone] or [phone]       Fax: [phone]   RxID:   [id number] CLEOCIN-T 1 % GEL (CLINDAMYCIN PHOSPHATE) apply to face twice daily  #1 month x 11   Entered and Authorized by:   Mack Hook MD   Signed by:   Mack Hook MD on 04/12/2010   Method used:   Electronically to        [facility]. # [id number]* (retail)       [facility]       [place], [place]  [address]       Ph: [phone] or [phone]       Fax: [phone]   RxID:   [id number]   Laboratory Results   Urine Tests    Routine Urinalysis   Glucose: negative   (Normal Range: Negative) Bilirubin: negative   (Normal Range: Negative) Ketone: negative   (Normal Range: Negative) Spec. Gravity: <1.005   (Normal Range: 1.003-1.035) Blood: trace-intact   (Normal Range: Negative) pH: 6.0   (Normal Range: 5.0-8.0) Protein: 100   (Normal Range: Negative) Urobilinogen: 0.2   (Normal Range: 0-1) Nitrite: negative   (Normal Range: Negative) Leukocyte Esterace: trace   (Normal Range: Negative)       Appended Document: CPP////KT  Laboratory Results  Date/Time Received: May 17, 2010 11:56 AM   Stool - Occult Blood Hemmoccult #1: negative Date: 05/16/2010 Hemoccult #2:  negative Date: 05/16/2010 Hemoccult #3: negative Date: 05/16/2010

## 2010-11-22 NOTE — Assessment & Plan Note (Signed)
Summary: right leg problem//gk   Vital Signs:  Patient profile:   45 year old female Height:      64 inches Weight:      216 pounds BMI:     37.21 Temp:     97.0 degrees F oral Pulse rate:   88 / minute Pulse rhythm:   regular Resp:     18 per minute BP sitting:   130 / 86  (left arm) Cuff size:   large  Vitals Entered By: [place] Shannon (July 14, 2010 8:56 AM) CC: pt is okay...Marland Kitchen pt is here for a little check up.....Marland Kitchen pt says there is nothing wrong with her leg so they cancel appt Is Patient Diabetic? No Pain Assessment Patient in pain? no       Does patient need assistance? Functional Status Self care Ambulation Normal   CC:  pt is okay...Marland Kitchen pt is here for a little check up.....Marland Kitchen pt says there is nothing wrong with her leg so they cancel appt.  Allergies: No Known Drug Allergies   Complete Medication List: 1)  Depakote Er 500 Mg Tb24 (Divalproex sodium) .... 3 tabs by mouth at bed[facility]--[facility]. 2)  Risperdal 3 Mg Tabs (Risperidone) .Marland Kitchen.. 1 tab by mouth at bedtime--[facility]  [place]. 3)  Cleocin-t 1 % Gel (Clindamycin phosphate) .... Apply to face twice daily 4)  Systane 0.4-0.3 % Soln (Polyethyl glycol-propyl glycol) .Marland Kitchen.. 1 drop each eye every 3 hours as needed 5)  Ferrous Sulfate 325 (65 Fe) Mg Tabs (Ferrous sulfate) .... Take 1 tablet by mouth two times daily with 8oz of orange juice 6)  Chlorhexidine Gluconate 0.12 % Soln (Chlorhexidine gluconate) .Marland Kitchen.. 1 tsp swish and spit three times a day 7)  Depo-provera 150 Mg/ml Im Susp (Medroxyprogesterone acetate) .... Im every 3 months at office 8)  Fluticasone Propionate 50 Mcg/act Susp (Fluticasone propionate) .... 2 sprays each nostril daily 9)  Gemfibrozil 600 Mg Tabs (Gemfibrozil) .Marland Kitchen.. 1 tab by mouth two times a day 10)  Vesicare 5 Mg Tabs (Solifenacin succinate) .Marland Kitchen.. 1 tab by mouth daily 11)  Depo-provera 150 Mg/ml Susp (Medroxyprogesterone acetate) .Marland KitchenMarland KitchenMarland Kitchen 150 mg im once every 3  months

## 2010-11-22 NOTE — Progress Notes (Signed)
Summary: Needs labs  Phone Note Outgoing Call   Summary of Call: [facility] last lipid panel done 01/2009 - gemfibrozil refilled for 1 month but pt has not had lipids checked in over 1 year she needs to make a fasting lab appt Initial call taken by: Aurora Mask FNP,  Mar 16, 2010 12:21 PM  Follow-up for Phone Call        Left message on answering machine to return call. Sherian Maroon RN  Mar 17, 2010 11:54 AM

## 2010-11-22 NOTE — Progress Notes (Signed)
Summary: HAS  A COUGH  Phone Note Other Incoming   [facility]: Jane [name] PT. MS BROC[facility] SISTER CALLED TO SAYS THAT Jane [name] HAS HAD A COUGH FOR A WEEK AND  SHE HAS TRIED OVER THE COUNTER MEDS AND NOTHING HAS WORKED. SHE USES [facility] ON [address]. Initial call taken by: Roberto Scales,  September 12, 2010 10:22 AM  Follow-up for Phone Call        Coughing for 2 wks. dry cough, worse @ night. Otherwise feels fine. Has tried Robitussin with no relief. Advised to drink plenty of water, use her fluticisone daily as prescribed. May try OTC allergy medication. Shower directing warm water over sinus areas. Elevate HOB. Call if develops fever, coughing up or nasal D/C greenish yellow or feels worse. Follow-up by: Bridgett Larsson RN,  September 12, 2010 5:37 PM

## 2010-11-22 NOTE — Progress Notes (Signed)
Summary: Rx order   Phone Note Call from Patient Call back at [id number]   Summary of Call: Pt has a  mamogram appointment at [facility] hospital her app is 9:00 am on october 31. please fax the order Initial call taken by: Trula Ore,  August 02, 2010 2:23 PM  Follow-up for Phone Call        order faxed Follow-up by: Rhunette Croft,  August 03, 2010 12:11 PM

## 2010-11-22 NOTE — Letter (Signed)
Summary: *Referral Letter  [facility]  [address] [place],  [place] [address]   Phone: [phone]  Fax: [phone]    05/21/2010  Thank you in advance for agreeing to see my patient:  Jane [name] [address] [place], [place]  [address]  Phone: [phone]  Reason for Referral: Worsening in last year of chronic renal insufficiency.  Elevated creatinine dates back to at least 2005, with Creatinine ranging from 1.7/1.9 range since.  Jane [name]  was living in a group home at the time and eventually established at [facility] beginning of 2008, where her elevated creatinine was noted.  Renal ultrasound in 2008 did not show obstruction, but consistent with intrinsic renal disease.  Unable to get 24 hour urine collection while living in group home and creatinine remained fairly stable.  At recent physical, creatinine noted to jump from one year ago to 2.5.  Now living with sister and seems to be thriving there.  Was able to collect urine for 24 hour studies with enclosed results--CreaCl in 34 range with some mild proteinuria.  UAs have been normal in past, though trace blood with last UA at physical.  ANA, sed rate, serum immunoelectrophoresis and repeat renal and collecting system ultrasound ordered and pending.  Has mild chronic anemia as well.  Have asked sister to allow pt. to drink water more(history of polydipsia) and avoid NSAIDS--no history of her receiving the latter.  Procedures Requested: Evaluation and recommendation for follow up  Current Medical Problems: 1)  ENCOUNTER FOR LONG-TERM USE OF OTHER MEDICATIONS (ICD-V58.69) 2)  ANKLE SPRAIN, RIGHT (ICD-845.00) 3)  ROUTINE GYNECOLOGICAL EXAMINATION (ICD-V72.31) 4)  CONTRACEPTIVE MANAGEMENT (ICD-V25.09) 5)  WEIGHT GAIN (ICD-783.1) 6)  AGITATION (ICD-307.9) 7)  POLYDIPSIA (ICD-783.5) 8)  TINEA PEDIS (ICD-110.4) 9)  DERMATOPHYTOSIS OF FOOT (ICD-110.4) 10)  INSOMNIA (ICD-780.52) 11)  FREQUENCY,  URINARY (ICD-788.41) 12)  VAGINAL DISCHARGE (ICD-623.5) 13)  DECREASED HEARING, LEFT (ICD-389.9) 14)  HEALTH MAINTENANCE EXAM (ICD-V70.0) 15)  RIGHT EYELID LESION (ICD-238.2) 16)  SCREENING EXAMINATION FOR PULMONARY TUBERCULOSIS (ICD-V74.1) 17)  ANEMIA, IRON DEFICIENCY (ICD-280.9) 18)  SEIZURE DISORDER (ICD-780.39) 19)  REDUCTION MAMMOPLASTY, HX OF (ICD-V15.9) 20)  OBESITY NOS (ICD-278.00) 21)  ACNE NEC (ICD-706.1) 22)  DYSLIPIDEMIA (ICD-272.4) 23)  URINARY INCONTINENCE, URGE (ICD-788.31) 24)  RENAL INSUFFICIENCY, CHRONIC (ICD-585.9) 25)  DISORDER, BIPOLAR NOS (ICD-296.80) 26)  DISORDER, SCHIZOAFFECTIVE, UNSPC (ICD-295.70) 27)  RETARDATION, MENTAL NOS (ICD-319)   Current Medications: 1)  DEPAKOTE ER 500 MG  TB24 (DIVALPROEX SODIUM) 3 tabs by mouth at bedtime--[facility] 2)  RISPERDAL 3 MG  TABS (RISPERIDONE) 1 tab by mouth at bedtime--[facility] [place]. 3)  CLEOCIN-T 1 % GEL (CLINDAMYCIN PHOSPHATE) apply to face twice daily 4)  SYSTANE 0.4-0.3 %  SOLN (POLYETHYL GLYCOL-PROPYL GLYCOL) 1 drop each eye every 3 hours as needed 5)  FERROUS SULFATE 325 (65 FE) MG  TABS (FERROUS SULFATE) take 1 tablet by mouth two times daily with 8oz of orange juice 6)  CHLORHEXIDINE GLUCONATE 0.12 %  SOLN (CHLORHEXIDINE GLUCONATE) 1 tsp swish and spit three times a day 7)  DEPO-PROVERA 150 MG/ML IM SUSP (MEDROXYPROGESTERONE ACETATE) Im every 3 months at office 8)  FLUTICASONE PROPIONATE 50 MCG/ACT SUSP (FLUTICASONE PROPIONATE) 2 sprays each nostril daily 9)  GEMFIBROZIL 600 MG TABS (GEMFIBROZIL) 1 tab by mouth two times a day 10)  VESICARE 5 MG TABS (SOLIFENACIN SUCCINATE) 1 tab by mouth daily 11)  DEPO-PROVERA 150 MG/ML SUSP (MEDROXYPROGESTERONE ACETATE) 150 mg IM once every 3 months  Past Medical History: 1)  RIGHT EYELID LESION (ICD-238.2) 2)  SCREENING EXAMINATION FOR PULMONARY TUBERCULOSIS (ICD-V74.1) 3)  ANEMIA, IRON DEFICIENCY (ICD-280.9) 4)  SEIZURE  DISORDER (ICD-780.39) 5)  REDUCTION MAMMOPLASTY, HX OF (ICD-V15.9) 6)  OBESITY NOS (ICD-278.00) 7)  ACNE NEC (ICD-706.1) 8)  DYSLIPIDEMIA (ICD-272.4) 9)  URINARY INCONTINENCE, URGE (ICD-788.31) 10)  RENAL INSUFFICIENCY, CHRONIC (ICD-585.9) 11)  DISORDER, BIPOLAR NOS (ICD-296.80) 12)  DISORDER, SCHIZOAFFECTIVE, UNSPC (ICD-295.70) 13)  RETARDATION, MENTAL NOS (ICD-319)   Prior History of Blood Transfusions:   Pertinent Labs:    Thank you again for agreeing to see our patient; please contact us if you have any further questions or need additional information.  Sincerely,  Mack Hook MD

## 2010-11-22 NOTE — Letter (Signed)
Summary: NUTRITION & DIABETES  NUTRITION & DIABETES   Imported By: Roland Earl 12/10/2009 12:09:16  _____________________________________________________________________  External Attachment:    Type:   Image     Comment:   External Document

## 2010-11-24 NOTE — Assessment & Plan Note (Signed)
Summary: 6 MO F/U AND CONTINUED COUGH///RJE   Vital Signs:  Patient profile:   45 year old female Menstrual status:  Depo Weight:      220.13 pounds Temp:     96.6 degrees F oral Pulse rate:   102 / minute BP sitting:   144 / 90  (left arm) Cuff size:   regular  Vitals Entered By: Aquilla Solian CMA (October 04, 2010 3:23 PM) CC: 6 month f/u from 04/12/10 for a general f/u. Concerned about a cough x2 month. Runny nose and some congestion associated w/cough.  Is Patient Diabetic? No Pain Assessment Patient in pain? no       Does patient need assistance? Ambulation Normal     Menstrual Status Depo Last PAP Result Normal.   CC:  6 month f/u from 04/12/10 for a general f/u. Concerned about a cough x2 month. Runny nose and some congestion associated w/cough. .  History of Present Illness: 1.  Cough for 2 months:  Sister states sounded like something draining down her throat.  Sounds strangled at night.  No fever.  Cough is nonproductive.  Thick  white mucouse from nose for past 2 weeks.  Throat does hurt.  Ears do not hurt.  Has complained of head hurting and sounds like may have some sinus congestion.  Has been sneezing.    Current Medications (verified): 1)  Depakote Er 500 Mg  Tb24 (Divalproex Sodium) .... 3 Tabs By Mouth At [employer]. 2)  Risperdal 3 Mg  Tabs (Risperidone) .Marland Kitchen.. 1 Tab By Mouth At [facility]. 3)  Cleocin-T 1 % Gel (Clindamycin Phosphate) .... Apply To Face Twice Daily 4)  Systane 0.4-0.3 %  Soln (Polyethyl Glycol-Propyl Glycol) .Marland Kitchen.. 1 Drop Each Eye Every 3 Hours As Needed 5)  Ferrous Sulfate 325 (65 Fe) Mg  Tabs (Ferrous Sulfate) .... Take 1 Tablet By Mouth Two Times Daily With 8oz of Orange Juice 6)  Chlorhexidine Gluconate 0.12 %  Soln (Chlorhexidine Gluconate) .Marland Kitchen.. 1 Tsp Swish and Spit Three Times A Day 7)  Depo-Provera 150 Mg/ml Im Susp (Medroxyprogesterone Acetate) .... Im Every 3 Months At Office 8)   Fluticasone Propionate 50 Mcg/act Susp (Fluticasone Propionate) .... 2 Sprays Each Nostril Daily 9)  Gemfibrozil 600 Mg Tabs (Gemfibrozil) .Marland Kitchen.. 1 Tab By Mouth Two Times A Day 10)  Vesicare 5 Mg Tabs (Solifenacin Succinate) .Marland Kitchen.. 1 Tab By Mouth Daily 11)  Depo-Provera 150 Mg/ml Susp (Medroxyprogesterone Acetate) .Marland KitchenMarland KitchenMarland Kitchen 150 Mg Im Once Every 3 Months  Allergies (verified): No Known Drug Allergies  Physical Exam  General:  NAD Eyes:  No corneal or conjunctival inflammation noted. EOMI. Perrla. Funduscopic exam benign, without hemorrhages, exudates or papilledema. Vision grossly normal. Ears:  External ear exam shows no significant lesions or deformities.  Otoscopic examination reveals clear canals, tympanic membranes are intact bilaterally without bulging, retraction, inflammation or discharge. Hearing is grossly normal bilaterally. Nose:  Clear dishcarge with swollen turbinates Mouth:  pharynx pink and moist.   Neck:  No deformities, masses, or tenderness noted. Lungs:  Scattered mild wheezes.  No crackles Abdomen:  soft and non-tender.     Impression & Recommendations:  Problem # 1:  BRONCHITIS, ACUTE WITH MILD BRONCHOSPASM (ICD-466.0) Push fluids Her updated medication list for this problem includes:    Azithromycin 250 Mg Tabs (Azithromycin) .Marland Kitchen... 2 tabs by mouth today, then 1 tab daily for 4 more days    Proventil Hfa 108 (90 Base) Mcg/act Aers (Albuterol sulfate) .Marland KitchenMarland KitchenMarland KitchenMarland Kitchen 2  puffs every 4 hours as needed for wheeze  Complete Medication List: 1)  Depakote Er 500 Mg Tb24 (Divalproex sodium) .... 3 tabs by mouth at bedtime--[facility]--[facility] carter. 2)  Risperdal 3 Mg Tabs (Risperidone) .Marland Kitchen.. 1 tab by mouth at bedtime--[facility]  [place]. 3)  Cleocin-t 1 % Gel (Clindamycin phosphate) .... Apply to face twice daily 4)  Systane 0.4-0.3 % Soln (Polyethyl glycol-propyl glycol) .Marland Kitchen.. 1 drop each eye every 3 hours as needed 5)  Ferrous Sulfate 325 (65 Fe) Mg Tabs (Ferrous  sulfate) .... Take 1 tablet by mouth two times daily with 8oz of orange juice 6)  Chlorhexidine Gluconate 0.12 % Soln (Chlorhexidine gluconate) .Marland Kitchen.. 1 tsp swish and spit three times a day 7)  Depo-provera 150 Mg/ml Im Susp (Medroxyprogesterone acetate) .... Im every 3 months at office 8)  Fluticasone Propionate 50 Mcg/act Susp (Fluticasone propionate) .... 2 sprays each nostril daily 9)  Gemfibrozil 600 Mg Tabs (Gemfibrozil) .Marland Kitchen.. 1 tab by mouth two times a day 10)  Vesicare 5 Mg Tabs (Solifenacin succinate) .Marland Kitchen.. 1 tab by mouth daily 11)  Depo-provera 150 Mg/ml Susp (Medroxyprogesterone acetate) .Marland KitchenMarland KitchenMarland Kitchen 150 mg im once every 3 months 12)  Azithromycin 250 Mg Tabs (Azithromycin) .... 2 tabs by mouth today, then 1 tab daily for 4 more days 13)  Cetirizine Hcl 10 Mg Tabs (Cetirizine hcl) .Marland Kitchen.. 1 tab by mouth daily for allergies 14)  Proventil Hfa 108 (90 Base) Mcg/act Aers (Albuterol sulfate) .... 2 puffs every 4 hours as needed for wheeze  Other Orders: Flu Vaccine 54yrs + [id number]) Admin 1st Vaccine [id number])  Patient Instructions: 1)  Keep appt. next week with Dr. Amil Amen Prescriptions: PROVENTIL HFA 108 (90 BASE) MCG/ACT AERS (ALBUTEROL SULFATE) 2 puffs every 4 hours as needed for wheeze  #1 x 0   Entered and Authorized by:   Mack Hook MD   Signed by:   Mack Hook MD on 10/04/2010   Method used:   Electronically to        [facility]. # [id number]* (retail)       [address]       [place], [place]  [address]       Ph: [phone] or [phone]       Fax: [phone]   RxID:   [id number] CETIRIZINE HCL 10 MG TABS (CETIRIZINE HCL) 1 tab by mouth daily for allergies  #30 x 6   Entered and Authorized by:   Mack Hook MD   Signed by:   Mack Hook MD on 10/04/2010   Method used:   Electronically to        [facility]. # [id number]* (retail)       [address]       [place], [place]  [address]       Ph:  [phone] or [phone]       Fax: [phone]   RxID:   [id number] AZITHROMYCIN 250 MG TABS (AZITHROMYCIN) 2 tabs by mouth today, then 1 tab daily for 4 more days  #6 x 0   Entered and Authorized by:   Mack Hook MD   Signed by:   Mack Hook MD on 10/04/2010   Method used:   Electronically to        [facility]. # [id number]* (retail)       [address]  [place], [place]  [address]       Ph: [phone] or [phone]       Fax: [phone]   RxID:   [id number]    Orders Added: 1)  Flu Vaccine 44yrs + [id number] 2)  Admin 1st Vaccine [id number] 3)  Est. Patient Level III [id number]   Immunizations Administered:  Influenza Vaccine # 1:    Vaccine Type: Fluvax 3+    Site: right deltoid    Mfr: GlaxoSmithKline    Dose: 0.5 ml    Route: IM    Given by: Aquilla Solian CMA    Exp. Date: 04/22/2011    Lot #: [id number]    VIS given: 05/17/10 version given October 04, 2010.  Flu Vaccine Consent Questions:    Do you have a history of severe allergic reactions to this vaccine? no    Any prior history of allergic reactions to egg and/or gelatin? no    Do you have a sensitivity to the preservative Thimersol? no    Do you have a past history of Guillan-Barre Syndrome? no    Do you currently have an acute febrile illness? no    Have you ever had a severe reaction to latex? no    Vaccine information given and explained to patient? yes    Are you currently pregnant? no   Immunizations Administered:  Influenza Vaccine # 1:    Vaccine Type: Fluvax 3+    Site: right deltoid    Mfr: GlaxoSmithKline    Dose: 0.5 ml    Route: IM    Given by: Aquilla Solian CMA    Exp. Date: 04/22/2011    Lot #: [id number]    VIS given: 05/17/10 version given October 04, 2010.

## 2010-11-24 NOTE — Progress Notes (Signed)
Summary: Change of clindamycin topical  Phone Note Outgoing Call   Summary of Call: Per pharmacy, pt. is refusing clindamycin gel -- wants solution.  Do you want to change Rx back? Initial call taken by: Sherian Maroon RN,  October 26, 2010 5:23 PM  Follow-up for Phone Call        Changed Notify pt's caregivers Follow-up by: Mack Hook MD,  October 27, 2010 9:47 AM  Additional Follow-up for Phone Call Additional follow up Details #1::        Sister notified.  Additional Follow-up by: Bridgett Larsson RN,  October 27, 2010 3:25 PM    New/Updated Medications: CLEOCIN-T 1 % SOLN (CLINDAMYCIN PHOSPHATE) apply to face two times a day Prescriptions: CLEOCIN-T 1 % SOLN (CLINDAMYCIN PHOSPHATE) apply to face two times a day  #1 month x 11   Entered and Authorized by:   Mack Hook MD   Signed by:   Mack Hook MD on 10/27/2010   Method used:   Electronically to        [facility]. # [id number]* (retail)       [address]       [place], [place]  [address]       Ph: [phone] or [phone]       Fax: [phone]   RxID:   [id number]

## 2010-11-24 NOTE — Assessment & Plan Note (Signed)
Summary: 6 month fu////kt   Vital Signs:  Patient profile:   45 year old female Menstrual status:  Depo  Weight:      218.56 pounds BMI:     37.65 Temp:     96.6 degrees F oral Pulse rate:   96 / minute Pulse rhythm:   regular Resp:     18 per minute BP sitting:   148 / 108  (left arm) Cuff size:   regular  Vitals Entered By: Aquilla Solian CMA (October 11, 2010 8:38 AM) CC: f/u from 10/04/10. Cough has not gone away.      Menstrual Status Depo  Last PAP Result Normal.   CC:  f/u from 10/04/10. Cough has not gone away. .  History of Present Illness: 1.  Acute bronchitis with bronchospasm:  Doing better, but sister states still coughing.  Mucous production better.  Using rescue inhaler only two times a day --not using when having coughing fits.  No fever.  Eating and drinking okay.  Still winded when walking up stairs.  2.  Sore right foot.  Started this morning.  No history of injury that sister is aware of.  No new shoes recently.  Sister states the soreness flairs in same area every couple of months.  Generally lasts for weeks.  Can be so painful that she is unwilling to get out of bed.  Father with gout.   3.  Elevated BP:  noted last visit and today.  No otc cold remedies.      Current Medications (verified): 1)  Depakote Er 500 Mg  Tb24 (Divalproex Sodium) .... 3 Tabs By Mouth At [employer]. 2)  Risperdal 3 Mg  Tabs (Risperidone) .Marland Kitchen.. 1 Tab By Mouth At [facility]. 3)  Cleocin-T 1 % Gel (Clindamycin Phosphate) .... Apply To Face Twice Daily 4)  Systane 0.4-0.3 %  Soln (Polyethyl Glycol-Propyl Glycol) .Marland Kitchen.. 1 Drop Each Eye Every 3 Hours As Needed 5)  Ferrous Sulfate 325 (65 Fe) Mg  Tabs (Ferrous Sulfate) .... Take 1 Tablet By Mouth Two Times Daily With 8oz of Orange Juice 6)  Chlorhexidine Gluconate 0.12 %  Soln (Chlorhexidine Gluconate) .Marland Kitchen.. 1 Tsp Swish and Spit Three Times A Day 7)  Depo-Provera 150 Mg/ml Im Susp  (Medroxyprogesterone Acetate) .... Im Every 3 Months At Office 8)  Fluticasone Propionate 50 Mcg/act Susp (Fluticasone Propionate) .... 2 Sprays Each Nostril Daily 9)  Gemfibrozil 600 Mg Tabs (Gemfibrozil) .Marland Kitchen.. 1 Tab By Mouth Two Times A Day 10)  Vesicare 5 Mg Tabs (Solifenacin Succinate) .Marland Kitchen.. 1 Tab By Mouth Daily 11)  Depo-Provera 150 Mg/ml Susp (Medroxyprogesterone Acetate) .Marland KitchenMarland KitchenMarland Kitchen 150 Mg Im Once Every 3 Months 12)  Azithromycin 250 Mg Tabs (Azithromycin) .... 2 Tabs By Mouth Today, Then 1 Tab Daily For 4 More Days 13)  Cetirizine Hcl 10 Mg Tabs (Cetirizine Hcl) .Marland Kitchen.. 1 Tab By Mouth Daily For Allergies 14)  Proventil Hfa 108 (90 Base) Mcg/act Aers (Albuterol Sulfate) .... 2 Puffs Every 4 Hours As Needed For Wheeze  Allergies (verified): No Known Drug Allergies  Physical Exam  General:  overweight, NAD Ears:  External ear exam shows no significant lesions or deformities.  Otoscopic examination reveals clear canals, tympanic membranes are intact bilaterally without bulging, retraction, inflammation or discharge. Hearing is grossly normal bilaterally. Nose:  Clear discharge with pale, boggy turbinates.   Mouth:  pharynx pink and moist.   Neck:  No deformities, masses, or tenderness noted. Lungs:  Normal respiratory effort,  chest expands symmetrically. Lungs are clear to auscultation, no crackles or wheezes. Heart:  Normal rate and regular rhythm. S1 and S2 normal without gallop, murmur, click, rub or other extra sounds. Extremities:  Right foot with possibly some mild swelling above medial arch.  Tender to touch in the 3 cm area.  Really just over soft tissue.  Pt. unwilling to move foot/ankle, but bears weight without obvious difficulty.   Impression & Recommendations:  Problem # 1:  BRONCHITIS, ACUTE WITH MILD BRONCHOSPASM (ICD-466.0) Much improved To use inhaler for coughing fits and  continue allergy meds. Her updated medication list for this problem includes:    Azithromycin 250 Mg Tabs  (Azithromycin) .Marland Kitchen... 2 tabs by mouth today, then 1 tab daily for 4 more days    Proventil Hfa 108 (90 Base) Mcg/act Aers (Albuterol sulfate) .Marland Kitchen... 2 puffs every 4 hours as needed for wheeze  Problem # 2:  HYPERTENSION (ICD-401.9) Start Metoprolol Discussed need to protect kidneys from repeat injury Her updated medication list for this problem includes:    Metoprolol Succinate 25 Mg Xr24h-tab (Metoprolol succinate) .Marland Kitchen... 1 tab by mouth daily  Problem # 3:  FOOT PAIN, RIGHT (ICD-729.5) ?possible early gout Orders: T-Uric Acid (Blood) [id number])  Problem # 4:  RENAL INSUFFICIENCY, CHRONIC (ICD-585.9) As in hypertension. Orders: T-Basic Metabolic Panel (99991111)  Complete Medication List: 1)  Depakote Er 500 Mg Tb24 (Divalproex sodium) .... 3 tabs by mouth at bedtime--[facility] car[facility]. 2)  Risperdal 3 Mg Tabs (Risperidone) .Marland Kitchen.. 1 tab by mouth at bedtime--[facility]  [place]. 3)  Cleocin-t 1 % Gel (Clindamycin phosphate) .... Apply to face twice daily 4)  Systane 0.4-0.3 % Soln (Polyethyl glycol-propyl glycol) .Marland Kitchen.. 1 drop each eye every 3 hours as needed 5)  Ferrous Sulfate 325 (65 Fe) Mg Tabs (Ferrous sulfate) .... Take 1 tablet by mouth two times daily with 8oz of orange juice 6)  Chlorhexidine Gluconate 0.12 % Soln (Chlorhexidine gluconate) .Marland Kitchen.. 1 tsp swish and spit three times a day 7)  Depo-provera 150 Mg/ml Im Susp (Medroxyprogesterone acetate) .... Im every 3 months at office 8)  Fluticasone Propionate 50 Mcg/act Susp (Fluticasone propionate) .... 2 sprays each nostril daily 9)  Gemfibrozil 600 Mg Tabs (Gemfibrozil) .Marland Kitchen.. 1 tab by mouth two times a day 10)  Vesicare 5 Mg Tabs (Solifenacin succinate) .Marland Kitchen.. 1 tab by mouth daily 11)  Depo-provera 150 Mg/ml Susp (Medroxyprogesterone acetate) .Marland KitchenMarland KitchenMarland Kitchen 150 mg im once every 3 months 12)  Azithromycin 250 Mg Tabs (Azithromycin) .... 2 tabs by mouth today, then 1 tab daily for 4 more days 13)  Cetirizine Hcl 10 Mg  Tabs (Cetirizine hcl) .Marland Kitchen.. 1 tab by mouth daily for allergies 14)  Proventil Hfa 108 (90 Base) Mcg/act Aers (Albuterol sulfate) .... 2 puffs every 4 hours as needed for wheeze 15)  Metoprolol Succinate 25 Mg Xr24h-tab (Metoprolol succinate) .Marland Kitchen.. 1 tab by mouth daily  Patient Instructions: 1)  Keep appt. Jan 9 for bp check 2)  Use inhaler if coughing a lot. 3)  Push water. 4)  Tylenol 1000 mg two times a day for foot pain until get labs back Prescriptions: METOPROLOL SUCCINATE 25 MG XR24H-TAB (METOPROLOL SUCCINATE) 1 tab by mouth daily  #30 x 11   Entered and Authorized by:   Mack Hook MD   Signed by:   Mack Hook MD on 10/11/2010   Method used:   Electronically to        [facility]. # [id number]* (retail)  [address]       [place], [place]  [address]       Ph: [phone] or [phone]       Fax: [phone]   RxID:   [id number]    Orders Added: 1)  T-Uric Acid (Blood) [[id number]] 2)  T-Basic Metabolic Panel 0000000 3)  Est. Patient Level III [id number]

## 2010-11-24 NOTE — Progress Notes (Signed)
Summary: New Rx for Colcrys and PA approval  Phone Note Call from Patient   Summary of Call: [facility] Jenel Lucks uric acid level is 13.6 the test was repeated and verfied Initial call taken by: [place] Shannon,  October 12, 2010 9:47 AM  Follow-up for Phone Call        Please call pt's sister--make sure she is drinking 6 glasses of water daily--may be a bit dry. Also, it does appear she may have gout.   Will call in colcrys for this. She may take it every 2 hours as long as having pain --if starts with abdominal discomfort or diarrhea, needs to stop giving every 2 hours and just go to two times a day dosing. Will see how she's doing when comes in for bp follow up in January Theresa--can you call for preauthorization. She is unable to take NSAIDS secondary to chronic renal failure and currently having a flair so cannot give Allopurinol.  Follow-up by: Mack Hook MD,  October 12, 2010 11:33 AM  Additional Follow-up for Phone Call Additional follow up Details #1::        Spoke with pt.'s sister and advised of pt.'s need to hydrate more and new Rx with instructions for use.  Will call for PA approval.  Sherian Maroon RN  October 12, 2010 12:13 PM  Spoke with Medicaid -- states that the pharmacy should have no trouble running this through -- that it comes up with no PA needed.  Sherian Maroon RN  October 12, 2010 1:00 PM  Thanks  Mack Hook MD  October 12, 2010 2:42 PM      New/Updated Medications: COLCRYS 0.6 MG TABS (COLCHICINE) 1 tab by mouth every 2 hours as long as with foot pain for first day, then 1 tab by mouth two times a day Prescriptions: PROVENTIL HFA 108 (90 BASE) MCG/ACT AERS (ALBUTEROL SULFATE) 2 puffs every 4 hours as needed for wheeze  #1 x 0   Entered and Authorized by:   Mack Hook MD   Signed by:   Mack Hook MD on 10/12/2010   Method used:   Electronically to        [facility]. # [id number]* (retail)       [address]       [place], [place]  [address]       Ph: [phone] or [phone]       Fax: [phone]   RxID:   [id number] COLCRYS 0.6 MG TABS (COLCHICINE) 1 tab by mouth every 2 hours as long as with foot pain for first day, then 1 tab by mouth two times a day  #60 x 1   Entered and Authorized by:   Mack Hook MD   Signed by:   Mack Hook MD on 10/12/2010   Method used:   Electronically to        [facility]. # [id number]* (retail)       [address]       [place], [place]  [address]       Ph: [phone] or [phone]       Fax: [phone]   RxID:   [id number]

## 2010-11-24 NOTE — Miscellaneous (Signed)
Summary: Med change  Clinical Lists Changes Note from pharmacy - unappropriate dose  -please refer to PI -max 0.6mg  two times a day for flare 0.3mg  if severe renal impairment Medications: Changed medication from COLCRYS 0.6 MG TABS (COLCHICINE) 1 tab by mouth every 2 hours as long as with foot pain for first day, then 1 tab by mouth two times a day to COLCRYS 0.6 MG TABS (COLCHICINE) One- half tablet by mouth two times a day for foot pain - Signed Rx of COLCRYS 0.6 MG TABS (COLCHICINE) One- half tablet by mouth two times a day for foot pain;  #30 x 0;  Signed;  Entered by: Aurora Mask FNP;  Authorized by: Aurora Mask FNP;  Method used: Electronically to [facility]. # [id number]*, [address] [place], [place], [place]  [address], Ph: [phone] or [phone], Fax: [phone]    Prescriptions: COLCRYS 0.6 MG TABS (COLCHICINE) One- half tablet by mouth two times a day for foot pain  #30 x 0   Entered and Authorized by:   Aurora Mask FNP   Signed by:   Aurora Mask FNP on 10/12/2010   Method used:   Electronically to        [facility]. # [id number]* (retail)       [address]       [place], [place]  [address]       Ph: [phone] or [phone]       Fax: [phone]   RxID:   [id number]

## 2011-01-03 NOTE — Letter (Signed)
Summary: [facility]  [facility]   Imported By: Roland Earl 12/26/2010 15:36:44  _____________________________________________________________________  External Attachment:    Type:   Image     Comment:   External Document

## 2011-03-07 NOTE — Op Note (Signed)
Jane [name], Jane [name] NO.:  [id number]   MEDICAL RECORD NO.:  [id number]          PATIENT TYPE:  AMB   LOCATION:                                FACILITY:  [facility]   PHYSICIAN:  Guss Bunde, M.D. DATE OF BIRTH:  1966-02-26   DATE OF PROCEDURE:  07/02/2007  DATE OF DISCHARGE:                               OPERATIVE REPORT   PREOPERATIVE DIAGNOSIS:  Mentally retarded female unable to tolerate a  pelvic exam or Pap smear.   POSTOPERATIVE DIAGNOSIS:  Mentally retarded female unable to tolerate a  pelvic exam or Pap smear.   PROCEDURE:  Pelvic exam with Pap smear under anesthesia.   SURGEON:  Dr. Silas Sacramento.   ASSISTANT:  Dr. Brooke Pace.   ANESTHESIA:  General.   SPECIMEN:  Pap smear with HPV typing.  Specimens sent to cytopathology.   ESTIMATED BLOOD LOSS:  Minimal.   COMPLICATIONS:  None.   PROCEDURE:  The patient was taken to the operating room where she was  placed under general anesthesia.  She was then placed in the dorsal  lithotomy position.  Her external genitalia appeared normal.  The metal  speculum was inserted into the vaginal vault.  Vaginal mucosa appears  normal.  Cervix appears normal.  Pap smear obtained with spatula and  brush and placed in specimen vial.  The metal speculum was removed.  A  bimanual exam was done with the uterus and the adnexa unable to fully  appreciate secondary to the patient's body habitus. The specimen will be  sent to cytopathology.  The patient was taken to the recovery room in  stable condition     ______________________________  Audry Pili, MD      Guss Bunde, M.D.  Electronically Signed    LNJ/MEDQ  D:  07/02/2007  T:  07/03/2007  Job:  [id number]

## 2011-03-07 NOTE — Group Therapy Note (Signed)
Jane [name], BARRIONUEVO NO.:  [id number]   MEDICAL RECORD NO.:  [id number]          PATIENT TYPE:  WOC   LOCATION:  [facility]                   FACILITY:  [facility]   PHYSICIAN:  Silas Sacramento, MD      DATE OF BIRTH:  [birth year]   DATE OF SERVICE:  06/05/2007                                  CLINIC NOTE   The patient is a 45 year old nulliparous patient who lives in a group  home who is here for a Pap smear.  The patient to their knowledge has  never been sexually active and the patient also denies sexual activity;  however, due to the mental retardation I do not know if she has ever  been sexually active.  She used to get her Pap smears done in a family  practice care center up in [place], [place], however he is out of  business.  The patient is very anxious today and will not tolerate any  exam at all.  She is also complaining that her care workers do complain  of an odor but the patient denies this.  The only part of her body I got  to look at carefully was her inner thighs and it looks like there is  evidence of yeast and we will treat her presumptively for yeast.  We  will also schedule a Pap smear and pelvic under anesthesia, hopefully  MAC.   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   MEDICATIONS:  Nasacort, Depo-Provera, Risperdal, Milk of Magnesia,  chlorhexidine, Depakote, Detrol, ferrous sulfate, and gemfibrozil.   PHYSICAL EXAMINATION:  Yeast on the labia and inner thighs.  No other  part of the pelvic could be done.  Abdomen is obese, nontender.  BREAST EXAM:  Evidence of reduction.  No masses.  Nontender.  No  lymphadenopathy.  The patient also needs a mammogram.   We will do Pap smear with __________  typing.  If these both are  negative she can go to 2-3 year screening.           ______________________________  Silas Sacramento, MD     KL/MEDQ  D:  06/05/2007  T:  06/06/2007  Job:  [id number]

## 2011-07-24 ENCOUNTER — Other Ambulatory Visit (HOSPITAL_COMMUNITY): Payer: Self-pay | Admitting: Family Medicine

## 2011-07-24 DIAGNOSIS — Z1231 Encounter for screening mammogram for malignant neoplasm of breast: Secondary | ICD-10-CM

## 2011-07-24 LAB — URINE CULTURE

## 2011-07-24 LAB — POCT URINALYSIS DIP (DEVICE)
Ketones, ur: NEGATIVE
Operator id: 239701
Protein, ur: NEGATIVE

## 2011-08-04 LAB — CBC
HCT: 33.3 — ABNORMAL LOW
Hemoglobin: 11.7 — ABNORMAL LOW
MCHC: 35.1
RDW: 13.5

## 2011-08-24 ENCOUNTER — Ambulatory Visit (HOSPITAL_COMMUNITY)
Admission: RE | Admit: 2011-08-24 | Discharge: 2011-08-24 | Disposition: A | Discharge status: Home / Self Care | Payer: Medicaid Other | Source: Ambulatory Visit | Attending: Family Medicine | Admitting: Family Medicine

## 2011-08-24 DIAGNOSIS — Z1231 Encounter for screening mammogram for malignant neoplasm of breast: Secondary | ICD-10-CM | POA: Insufficient documentation

## 2012-07-14 ENCOUNTER — Emergency Department (HOSPITAL_COMMUNITY): Payer: Medicaid Other

## 2012-07-14 ENCOUNTER — Encounter (HOSPITAL_COMMUNITY): Payer: Self-pay | Admitting: *Deleted

## 2012-07-14 ENCOUNTER — Inpatient Hospital Stay (HOSPITAL_COMMUNITY)
Admission: EM | Admit: 2012-07-14 | Discharge: 2012-07-17 | DRG: 442 | Disposition: A | Discharge status: Home / Self Care | Payer: Medicaid Other | Attending: Family Medicine | Admitting: Family Medicine

## 2012-07-14 DIAGNOSIS — IMO0002 Reserved for concepts with insufficient information to code with codable children: Secondary | ICD-10-CM

## 2012-07-14 DIAGNOSIS — G9341 Metabolic encephalopathy: Secondary | ICD-10-CM

## 2012-07-14 DIAGNOSIS — N898 Other specified noninflammatory disorders of vagina: Secondary | ICD-10-CM

## 2012-07-14 DIAGNOSIS — N179 Acute kidney failure, unspecified: Secondary | ICD-10-CM

## 2012-07-14 DIAGNOSIS — R569 Unspecified convulsions: Secondary | ICD-10-CM

## 2012-07-14 DIAGNOSIS — I129 Hypertensive chronic kidney disease with stage 1 through stage 4 chronic kidney disease, or unspecified chronic kidney disease: Secondary | ICD-10-CM | POA: Diagnosis present

## 2012-07-14 DIAGNOSIS — E669 Obesity, unspecified: Secondary | ICD-10-CM

## 2012-07-14 DIAGNOSIS — G4733 Obstructive sleep apnea (adult) (pediatric): Secondary | ICD-10-CM | POA: Diagnosis present

## 2012-07-14 DIAGNOSIS — R350 Frequency of micturition: Secondary | ICD-10-CM

## 2012-07-14 DIAGNOSIS — G4700 Insomnia, unspecified: Secondary | ICD-10-CM

## 2012-07-14 DIAGNOSIS — S93409A Sprain of unspecified ligament of unspecified ankle, initial encounter: Secondary | ICD-10-CM

## 2012-07-14 DIAGNOSIS — F319 Bipolar disorder, unspecified: Secondary | ICD-10-CM | POA: Diagnosis present

## 2012-07-14 DIAGNOSIS — F79 Unspecified intellectual disabilities: Secondary | ICD-10-CM | POA: Diagnosis present

## 2012-07-14 DIAGNOSIS — D509 Iron deficiency anemia, unspecified: Secondary | ICD-10-CM

## 2012-07-14 DIAGNOSIS — N3941 Urge incontinence: Secondary | ICD-10-CM

## 2012-07-14 DIAGNOSIS — Z9189 Other specified personal risk factors, not elsewhere classified: Secondary | ICD-10-CM

## 2012-07-14 DIAGNOSIS — K7290 Hepatic failure, unspecified without coma: Principal | ICD-10-CM | POA: Diagnosis present

## 2012-07-14 DIAGNOSIS — I10 Essential (primary) hypertension: Secondary | ICD-10-CM

## 2012-07-14 DIAGNOSIS — R739 Hyperglycemia, unspecified: Secondary | ICD-10-CM

## 2012-07-14 DIAGNOSIS — M79609 Pain in unspecified limb: Secondary | ICD-10-CM

## 2012-07-14 DIAGNOSIS — N189 Chronic kidney disease, unspecified: Secondary | ICD-10-CM | POA: Diagnosis present

## 2012-07-14 DIAGNOSIS — T4275XA Adverse effect of unspecified antiepileptic and sedative-hypnotic drugs, initial encounter: Secondary | ICD-10-CM | POA: Diagnosis present

## 2012-07-14 DIAGNOSIS — F259 Schizoaffective disorder, unspecified: Secondary | ICD-10-CM | POA: Diagnosis present

## 2012-07-14 DIAGNOSIS — Z79899 Other long term (current) drug therapy: Secondary | ICD-10-CM

## 2012-07-14 DIAGNOSIS — E785 Hyperlipidemia, unspecified: Secondary | ICD-10-CM

## 2012-07-14 DIAGNOSIS — J209 Acute bronchitis, unspecified: Secondary | ICD-10-CM

## 2012-07-14 DIAGNOSIS — D649 Anemia, unspecified: Secondary | ICD-10-CM

## 2012-07-14 DIAGNOSIS — N184 Chronic kidney disease, stage 4 (severe): Secondary | ICD-10-CM | POA: Diagnosis present

## 2012-07-14 DIAGNOSIS — R4182 Altered mental status, unspecified: Secondary | ICD-10-CM | POA: Diagnosis present

## 2012-07-14 DIAGNOSIS — E213 Hyperparathyroidism, unspecified: Secondary | ICD-10-CM | POA: Diagnosis present

## 2012-07-14 DIAGNOSIS — B353 Tinea pedis: Secondary | ICD-10-CM

## 2012-07-14 DIAGNOSIS — R635 Abnormal weight gain: Secondary | ICD-10-CM

## 2012-07-14 DIAGNOSIS — L708 Other acne: Secondary | ICD-10-CM

## 2012-07-14 DIAGNOSIS — R631 Polydipsia: Secondary | ICD-10-CM

## 2012-07-14 DIAGNOSIS — F71 Moderate intellectual disabilities: Secondary | ICD-10-CM | POA: Diagnosis present

## 2012-07-14 DIAGNOSIS — E860 Dehydration: Secondary | ICD-10-CM

## 2012-07-14 DIAGNOSIS — Z23 Encounter for immunization: Secondary | ICD-10-CM

## 2012-07-14 DIAGNOSIS — K7682 Hepatic encephalopathy: Principal | ICD-10-CM | POA: Diagnosis present

## 2012-07-14 HISTORY — DX: Hyperlipidemia, unspecified: E78.5

## 2012-07-14 HISTORY — DX: Anemia, unspecified: D64.9

## 2012-07-14 HISTORY — DX: Disorder of kidney and ureter, unspecified: N28.9

## 2012-07-14 HISTORY — DX: Essential (primary) hypertension: I10

## 2012-07-14 HISTORY — DX: Moderate intellectual disabilities: F71

## 2012-07-14 LAB — CBC WITH DIFFERENTIAL/PLATELET
Eosinophils Relative: 1 % (ref 0–5)
HCT: 30 % — ABNORMAL LOW (ref 36.0–46.0)
Hemoglobin: 10.1 g/dL — ABNORMAL LOW (ref 12.0–15.0)
Lymphocytes Relative: 13 % (ref 12–46)
Lymphs Abs: 1.2 10*3/uL (ref 0.7–4.0)
MCV: 99.7 fL (ref 78.0–100.0)
Monocytes Absolute: 1.2 10*3/uL — ABNORMAL HIGH (ref 0.1–1.0)
RBC: 3.01 MIL/uL — ABNORMAL LOW (ref 3.87–5.11)
WBC: 9.2 10*3/uL (ref 4.0–10.5)

## 2012-07-14 LAB — AMMONIA: Ammonia: 148 umol/L — ABNORMAL HIGH (ref 11–60)

## 2012-07-14 LAB — GLUCOSE, CAPILLARY

## 2012-07-14 LAB — COMPREHENSIVE METABOLIC PANEL
Albumin: 2.8 g/dL — ABNORMAL LOW (ref 3.5–5.2)
BUN: 83 mg/dL — ABNORMAL HIGH (ref 6–23)
CO2: 18 mEq/L — ABNORMAL LOW (ref 19–32)
Calcium: 10.2 mg/dL (ref 8.4–10.5)
Creatinine, Ser: 6.54 mg/dL — ABNORMAL HIGH (ref 0.50–1.10)
GFR calc non Af Amer: 7 mL/min — ABNORMAL LOW (ref >90)
Glucose, Bld: 124 mg/dL — ABNORMAL HIGH (ref 70–99)
Potassium: 4.4 mEq/L (ref 3.5–5.1)
Total Protein: 7.3 g/dL (ref 6.0–8.3)

## 2012-07-14 LAB — URINALYSIS, ROUTINE W REFLEX MICROSCOPIC
Bilirubin Urine: NEGATIVE
Glucose, UA: NEGATIVE mg/dL
Ketones, ur: NEGATIVE mg/dL
Specific Gravity, Urine: 1.01 (ref 1.005–1.030)
pH: 6 (ref 5.0–8.0)

## 2012-07-14 LAB — RAPID URINE DRUG SCREEN, HOSP PERFORMED
Opiates: NOT DETECTED
Tetrahydrocannabinol: NOT DETECTED

## 2012-07-14 LAB — VALPROIC ACID LEVEL: Valproic Acid Lvl: 57.8 ug/mL (ref 50.0–100.0)

## 2012-07-14 LAB — URINE MICROSCOPIC-ADD ON

## 2012-07-14 LAB — PROTIME-INR: INR: 1.21 (ref 0.00–1.49)

## 2012-07-14 MED ORDER — SODIUM CHLORIDE 0.9 % IV SOLN
INTRAVENOUS | Status: DC
  Administered 2012-07-17: via INTRAVENOUS

## 2012-07-14 MED ORDER — LACTULOSE 10 GM/15ML PO SOLN
30.0000 g | Freq: Once | ORAL | Status: AC
  Administered 2012-07-14: 30 g via ORAL
  Filled 2012-07-14: qty 45

## 2012-07-14 MED ORDER — VITAMIN D3 50 MCG (2000 UT) PO CAPS: 2000.0000 [IU] | ORAL_CAPSULE | Freq: Every day | ORAL | Status: DC

## 2012-07-14 MED ORDER — FERROUS SULFATE 325 (65 FE) MG PO TBEC: 325.0000 mg | DELAYED_RELEASE_TABLET | Freq: Every day | ORAL | Status: DC

## 2012-07-14 MED ORDER — GEMFIBROZIL 600 MG PO TABS
600.0000 mg | ORAL_TABLET | Freq: Two times a day (BID) | ORAL | Status: DC
  Filled 2012-07-14 (×3): qty 1

## 2012-07-14 MED ORDER — LORAZEPAM 2 MG/ML IJ SOLN
2.0000 mg | Freq: Once | INTRAMUSCULAR | Status: DC
  Filled 2012-07-14: qty 1

## 2012-07-14 MED ORDER — DARIFENACIN HYDROBROMIDE ER 7.5 MG PO TB24
7.5000 mg | ORAL_TABLET | Freq: Every day | ORAL | Status: DC
  Filled 2012-07-14: qty 1

## 2012-07-14 MED ORDER — DIVALPROEX SODIUM ER 500 MG PO TB24
1500.0000 mg | ORAL_TABLET | Freq: Every day | ORAL | Status: DC
  Administered 2012-07-14: 1500 mg via ORAL
  Filled 2012-07-14: qty 3

## 2012-07-14 MED ORDER — METOPROLOL SUCCINATE ER 100 MG PO TB24
100.0000 mg | ORAL_TABLET | Freq: Every day | ORAL | Status: DC
  Administered 2012-07-15 – 2012-07-17 (×3): 100 mg via ORAL
  Filled 2012-07-14 (×3): qty 1

## 2012-07-14 MED ORDER — LORATADINE 10 MG PO TABS
10.0000 mg | ORAL_TABLET | Freq: Every day | ORAL | Status: DC
  Administered 2012-07-15 – 2012-07-17 (×3): 10 mg via ORAL
  Filled 2012-07-14 (×3): qty 1

## 2012-07-14 MED ORDER — RISPERIDONE 3 MG PO TABS
3.0000 mg | ORAL_TABLET | Freq: Every day | ORAL | Status: DC
  Administered 2012-07-14: 3 mg via ORAL
  Filled 2012-07-14 (×2): qty 1

## 2012-07-14 MED ORDER — FERROUS SULFATE 325 (65 FE) MG PO TABS
325.0000 mg | ORAL_TABLET | Freq: Every day | ORAL | Status: DC
  Filled 2012-07-14 (×2): qty 1

## 2012-07-14 MED ORDER — SODIUM CHLORIDE 0.9 % IV BOLUS (SEPSIS)
500.0000 mL | Freq: Once | INTRAVENOUS | Status: AC
  Administered 2012-07-14: 500 mL via INTRAVENOUS

## 2012-07-14 MED ORDER — VITAMIN D3 25 MCG (1000 UNIT) PO TABS
2000.0000 [IU] | ORAL_TABLET | Freq: Every day | ORAL | Status: DC
  Filled 2012-07-14: qty 2

## 2012-07-14 MED ORDER — ACETAMINOPHEN 650 MG RE SUPP: 650.0000 mg | Freq: Four times a day (QID) | RECTAL | Status: DC | PRN

## 2012-07-14 MED ORDER — ACETAMINOPHEN 325 MG PO TABS: 650.0000 mg | ORAL_TABLET | Freq: Four times a day (QID) | ORAL | Status: DC | PRN

## 2012-07-14 MED ORDER — LEVOTHYROXINE SODIUM 50 MCG PO TABS
50.0000 ug | ORAL_TABLET | Freq: Every day | ORAL | Status: DC
  Administered 2012-07-15 – 2012-07-17 (×3): 50 ug via ORAL
  Filled 2012-07-14 (×4): qty 1

## 2012-07-14 NOTE — ED Notes (Signed)
Mother also reports the patient slept all day yesterday and again today
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
## 2012-07-14 NOTE — ED Notes (Signed)
Patient friend reports patient was walking up the steps,  She became light headed and nearly fell. Patient also reported to be shaky.  Patient has had noted slower gait,  She has slurred speech per mother today.  Last seen normal at bedtime 2300 last night.  Patient had noted emesis beside her bed last night per the mother.  Patient denies any pain.  Mother states she is just not acting like herself and she is not responding per her normal

## 2012-07-14 NOTE — H&P (Signed)
[facility] Admission History and Physical Service Pager: [phone]  Patient name: Jane [name] Medical record number: [id number] Date of birth: 1966/10/01 Age: 46 y.o. Gender: female  Primary Care Provider: Mack Hook, MD  Chief Complaint: slurred speech, sleepier than usual  Assessment and Plan: Jane [name] is a 46 y.o. year old female with chronic renal insufficiency presenting with altered mental status for 2 days. As of 2011 her baseline creatinine was 3 or less.  Today her creatinine was 6.5. It's unclear as to wether this is an acute change or a gradual decline over a year or more. Her decreased renal function can account partially for her hyperammonemia which is the most likely cause for her altered mental status, however her renal function alone does not seem to explain the extent of her hyperammonemia. Other DDx for hyperammonemia includes chronic hepatitis, infection, and ingestion.  1. Altered Mental Status/Hepatic encephalopathy: Symptoms for 1 day, ammonia 148, albumin 2.8.  1. Utox negative, serum salicylates and tylenol negative 2. UA cloudy with Mod leuk esterase 3. Check PT/INR, Hep C Ab, Heb B, HIV 4. Lactulose once, recheck ammonia in the AM 5. Obtain liver US in AM to eval for liver disease (CT with contrast might be better study but unable to do this due to renal function)  2. Renal insufficiency: Acute vs. Chronic- previously 2.76 in Dec 2011, 6.54 today.  1. Gentle hydration: NS at 50 ml/hr, renal US in AM.  Pt is making urine so am somewhat doubtful of a post-renal cuase 2. Renal was consulted by phone and will see in the AM.  Agree with their initial assessment that she has no indications for emergent dialysis 3. Patient seen by Dr. Lorrene Reid as an outpatient.  Would appreciate additional info on lab trends over past 2 years  3. Schizoaffective disorder: Unclear as to its contribution to altered mental status 1. Hold  risperdal 2. Monitor   4. Seizure disorder: No recent seizure activity 1. Depakote level therapeutic, will hold Depakote for now to see if it is playing into her AMS 2. Monitor  5. FEN/GI: Renal diet 6. Prophylaxis: SCDs 7. Disposition: Home pending improvement in mental status 8. Code Status: Full for now. Family unsure and will discuss   History of Present Illness: Jane [name] is a 46 y.o. year old female presenting with slurred speech and increased sleepiness. Her sister and brother in law give the history that that last night she had an episode of feeling faint while walking up some stairs. Also state that she has been walking much slower than usual for the last day. She normally walks, toilets, dresses, and eats on her own. They say that for the last 2 days she has been sleepier than usual and have also noted decreased PO intake and note 1 episode of vomiting last night. They note that last week she started colchicine for a gout flare but stopped taking it about three days ago, otherwise no new medications. Normally her speech is easily understood by everyone. They say that her renal function has been steadily declining for a while and hope not to have to go into dialysis. The deny cough, fever, chills, nausea, diarrhea, decreased urine, and complaints of pain.   Patient Active Problem List  Diagnosis  . Dermatophytosis of foot  . DYSLIPIDEMIA  . OBESITY NOS  . ANEMIA, IRON DEFICIENCY  . DISORDER, SCHIZOAFFECTIVE, UNSPC  . DISORDER, BIPOLAR NOS  . AGITATION  . RETARDATION, MENTAL NOS  .  RENAL INSUFFICIENCY, CHRONIC  . VAGINAL DISCHARGE  . ACNE NEC  . SEIZURE DISORDER  . INSOMNIA  . WEIGHT GAIN  . POLYDIPSIA  . URINARY INCONTINENCE, URGE  . FREQUENCY, URINARY  . ANKLE SPRAIN, RIGHT  . REDUCTION MAMMOPLASTY, HX OF  . HYPERTENSION  . BRONCHITIS, ACUTE WITH MILD BRONCHOSPASM  . FOOT PAIN, RIGHT   Past Medical History: Past Medical History  Diagnosis Date  . Renal  disease   . Moderately mentally retarded   . Hypertension   . Anemia   . Hyperlipemia    Past Surgical History: Past Surgical History  Procedure Date  . Breast reduction surgery   . Foot surgery    Social History: History  Substance Use Topics  . Smoking status: Never Smoker   . Smokeless tobacco: Not on file  . Alcohol Use: No   For any additional social history documentation, please refer to relevant sections of EMR.  Family History: No family history on file. Allergies: No Known Allergies No current facility-administered medications on file prior to encounter.   Current Outpatient Prescriptions on File Prior to Encounter  Medication Sig Dispense Refill  . cetirizine (ZYRTEC) 10 MG tablet Take 10 mg by mouth daily.      . colchicine 0.6 MG tablet Take 0.3 mg by mouth 2 (two) times daily as needed. For foot pain      . divalproex (DEPAKOTE ER) 500 MG 24 hr tablet Take 1,500 mg by mouth at bedtime.      . ferrous sulfate 325 (65 FE) MG EC tablet Take 325 mg by mouth daily with breakfast.      . gemfibrozil (LOPID) 600 MG tablet Take 600 mg by mouth 2 (two) times daily before a meal.      . levothyroxine (SYNTHROID, LEVOTHROID) 50 MCG tablet Take 50 mcg by mouth daily.      . metoprolol succinate (TOPROL-XL) 100 MG 24 hr tablet Take 100 mg by mouth daily. Take with or immediately following a meal.      . risperiDONE (RISPERDAL) 3 MG tablet Take 3 mg by mouth at bedtime.      . solifenacin (VESICARE) 5 MG tablet Take 10 mg by mouth daily.       Review Of Systems: Per HPI.  ROS was performed with family as patient is altered and unable to answer questions.  Physical Exam: BP 148/70  Pulse 79  Temp 98.5 F (36.9 C) (Oral)  Resp 16  SpO2 99% Exam: Gen: NAD, arousible but sleepy HEENT: NCAT oral mucosa moist and pink, no adenopathy CV: RRR, no murmur Resp: mild wheezes throughout, good air movement, non-labored, loud mouth breathing/snoring Abd: SNTND, BS present, no  guarding  Ext: 2+ pitting edema on BL LE Neuro: Speech slurred and words not distinguishable, very sleepy, withdrawn to noxious stimuli and does so in purposeful manner.  GCS 10   Labs and Imaging: CBC BMET   Lab 07/14/12 1540  WBC 9.2  HGB 10.1*  HCT 30.0*  PLT 238    Lab 07/14/12 1540  NA 137  K 4.4  CL 97  CO2 18*  BUN 83*  CREATININE 6.54*  GLUCOSE 124*  CALCIUM 10.2    Valproic acid level Q000111Q Salicylate 123456 Acetamenophen <15.0 Utox negative  UA cloudy, moderate leukocyte esterase  DG Chest 07/14/2012 IMPRESSION: Very low lung volumes with crowding of markings and/or pulmonary vascular congestion. However, no overt pulmonary edema or focal pneumonia identified.  CT Head 07/14/2012 IMPRESSION: Normal noncontrast CT  appearance of the brain.   Kenn File, MD 07/14/2012, 8:33 PM I have seen and evaluated the patient and agree with the above.  I have edited the above and it fully represents my exam and plan. [place] 07/14/2012,10:47 PM    FMTS Attending Admit Note Patient seen and examined, discussed with resident team and agree with assess/plan with following additions.  Briefly, a 46 yr old F who is brought to ED by family c/o generalized weakness, slurred speech and altered mental status over time period of days.  At time of my exam, patient is alone in room on floor.  She is arousable, able to answer "doctor" when I ask her where she is.  Denies pain.  Falls asleep snoring.  Workup reveals elevated serum Cr 6.54, ammonia 148 without elevated transaminases or markedly elevated INR and no known liver disease in record.  She takes valproic acid but has a low-therapeutic level of valproic acid today.  Her chem panel does not show hyperkalemia, but does have a CO2=18 and an anion gap of 22. Negative salicylate level and tox screen.   Assess/plan: Patient with altered mental status that is likely related to hyperammonemia, in absence of evidence of liver  disease by transaminases or INR.  She does have a low albumin on chem panel today.   1. Agree with treatment with lactulose to bring down ammonia level.   2. Workup for liver disease, including hepatic US to look for possible cirrhosis.  Expand search for viral hepatitis, and HIV.    Acute-on-chronic renal failure: May be contributing to her altered mental status. If evidence of liver disease per workup above, question whether hepatorenal.  1. Renal ultrasound  2. Would hold valproic acid for now in setting of her renal failure.  If shows signs of respiratory distress or vital sign instability, would have low threshold for transferring to higher level of care (SDU or ICU).  Dalbert Mayotte, MD

## 2012-07-14 NOTE — ED Provider Notes (Signed)
History     CSN: [id number]  Arrival date & time 07/14/12  64   First MD Initiated Contact with Patient 07/14/12 1707      Chief Complaint  Patient presents with  . Weakness  . Aphasia  . Altered Mental Status    (Consider location/radiation/quality/duration/timing/severity/associated sxs/prior treatment) Patient is a 46 y.o. female presenting with weakness and altered mental status. The history is provided by the patient and a parent. The history is limited by the condition of the patient.  Weakness The primary symptoms include altered mental status.  Additional symptoms include weakness.  Altered Mental Status   46 year old, female, with a history of mental retardation, renal insufficiency, schizoaffective disorder, and hypertension, was brought to the emergency department for evaluation of weakness, and altered mental status.  Her mother says that her symptoms have been progressive for the past one to 2 days.  She has not had vomiting, diarrhea, fever, or cough.  She has not changed any medications recently.  Level V caveat applies for altered mental status.  Past Medical History  Diagnosis Date  . Renal disease   . Moderately mentally retarded   . Hypertension   . Anemia   . Hyperlipemia     Past Surgical History  Procedure Date  . Breast reduction surgery   . Foot surgery     No family history on file.  History  Substance Use Topics  . Smoking status: Never Smoker   . Smokeless tobacco: Not on file  . Alcohol Use: No    OB History    Grav Para Term Preterm Abortions TAB SAB Ect Mult Living                  Review of Systems  Unable to perform ROS: Mental status change  Neurological: Positive for weakness.  Psychiatric/Behavioral: Positive for altered mental status.    Allergies  Review of patient's allergies indicates no known allergies.  Home Medications   Current Outpatient Rx  Name Route Sig Dispense Refill  . CETIRIZINE HCL 10 MG PO TABS  Oral Take 10 mg by mouth daily.    Marland Kitchen VITAMIN D3 2000 UNITS PO CAPS Oral Take 2,000 Units by mouth daily.    . COLCHICINE 0.6 MG PO TABS Oral Take 0.3 mg by mouth 2 (two) times daily as needed. For foot pain    . DIVALPROEX SODIUM ER 500 MG PO TB24 Oral Take 1,500 mg by mouth at bedtime.    Marland Kitchen FERROUS SULFATE 325 (65 FE) MG PO TBEC Oral Take 325 mg by mouth daily with breakfast.    . GEMFIBROZIL 600 MG PO TABS Oral Take 600 mg by mouth 2 (two) times daily before a meal.    . LEVOTHYROXINE SODIUM 50 MCG PO TABS Oral Take 50 mcg by mouth daily.    Marland Kitchen METOPROLOL SUCCINATE ER 100 MG PO TB24 Oral Take 100 mg by mouth daily. Take with or immediately following a meal.    . RISPERIDONE 3 MG PO TABS Oral Take 3 mg by mouth at bedtime.    . SOLIFENACIN SUCCINATE 5 MG PO TABS Oral Take 10 mg by mouth daily.      BP 123/74  Pulse 79  Temp 98.3 F (36.8 C) (Oral)  Resp 20  SpO2 99%  Physical Exam  Nursing note and vitals reviewed. Constitutional: She appears well-developed and well-nourished.       Alert, speaks with slurred speech but almost unintelligible Looks around  Sonorous respirations.  HENT:  Head: Normocephalic and atraumatic.  Eyes: Conjunctivae normal and EOM are normal.  Neck: Normal range of motion. Neck supple.  Cardiovascular: Normal rate, regular rhythm and intact distal pulses.   No murmur heard. Pulmonary/Chest: Effort normal and breath sounds normal. No respiratory distress.  Abdominal: Soft. Bowel sounds are normal. She exhibits no distension. There is no tenderness. There is no rebound and no guarding.  Musculoskeletal: She exhibits no edema and no tenderness.  Neurological: She is alert. No cranial nerve deficit.       Pt sat up and asked to go to bathroom.   When I tried to evaluate her plantar reflexes, she withdraws both legs  When RN staff tried to cath pt, she fights and closes her legs tightly so they were unable to cath her. Therefore, I believe her strength is 5/5.   Skin: Skin is warm and dry.  Psychiatric:       Decreased alertness and abnl speech according to mother    ED Course  Procedures (including critical care time)ams from  Unknown etio.  Moves all extremities though and is alert and does speak.  Therefore, doubt stroke.  I suspect metabolic etio or infx src.  Will check labs and cxr.  Labs Reviewed  COMPREHENSIVE METABOLIC PANEL - Abnormal; Notable for the following:    CO2 18 (*)     Glucose, Bld 124 (*)     BUN 83 (*)     Creatinine, Ser 6.54 (*)     Albumin 2.8 (*)     GFR calc non Af Amer 7 (*)     GFR calc Af Amer 8 (*)     All other components within normal limits  CBC WITH DIFFERENTIAL - Abnormal; Notable for the following:    RBC 3.01 (*)     Hemoglobin 10.1 (*)     HCT 30.0 (*)     Monocytes Relative 13 (*)     Monocytes Absolute 1.2 (*)     All other components within normal limits  GLUCOSE, CAPILLARY - Abnormal; Notable for the following:    Glucose-Capillary 120 (*)     All other components within normal limits  AMMONIA - Abnormal; Notable for the following:    Ammonia 148 (*)     All other components within normal limits  SALICYLATE LEVEL - Abnormal; Notable for the following:    Salicylate Lvl 123456 (*)     All other components within normal limits  ACETAMINOPHEN LEVEL  VALPROIC ACID LEVEL  URINALYSIS, ROUTINE W REFLEX MICROSCOPIC  URINE RAPID DRUG SCREEN (HOSP PERFORMED)  T4, FREE  T3, FREE   Ct Head Wo Contrast  07/14/2012  *RADIOLOGY REPORT*  Clinical Data: 46 year old female with increased weakness.  CT HEAD WITHOUT CONTRAST  Technique:  Contiguous axial images were obtained from the base of the skull through the vertex without contrast.  Comparison: None.  Findings: Visualized paranasal sinuses and mastoids are clear. Visualized orbit soft tissues are within normal limits.  Visualized scalp soft tissues are within normal limits.  No acute osseous abnormality identified.  Normal cerebral volume.  No  ventriculomegaly. No midline shift, mass effect, or evidence of mass lesion.  No acute intracranial hemorrhage identified.  No evidence of cortically based acute infarction identified.  No suspicious intracranial vascular hyperdensity. Gray-white matter differentiation is within normal limits throughout the brain.  IMPRESSION: Normal noncontrast CT appearance of the brain.   Original Report Authenticated By: Randall An, M.D.    Dg  Chest Port 1 View  07/14/2012  *RADIOLOGY REPORT*  Clinical Data: 46 year old female shortness of breath pneumonia.  PORTABLE CHEST - 1 VIEW  Comparison: None.  Findings: Semi upright AP portable view 1820 hours.  Low lung volumes with crowding of markings.  Allowing for this,  Cardiac size and mediastinal contours are within normal limits.  Visualized tracheal air column is within normal limits.  No definite effusion. No pneumothorax.  No definite pulmonary edema or consolidation.  IMPRESSION: Very low lung volumes with crowding of markings and/or pulmonary vascular congestion.  However, no overt pulmonary edema or focal pneumonia identified.   Original Report Authenticated By: Randall An, M.D.      No diagnosis found.  7:39 PM Spoke with FP resident.  They will come eval and admit pt  MDM  Hepatic encephalopathy        Barbara Cower, MD 07/14/12 1945

## 2012-07-14 NOTE — H&P (Signed)
FMTS Attending Admit Note Patient seen and examined by me, please see my note appended to resident note for more details. Dalbert Mayotte, MD

## 2012-07-15 ENCOUNTER — Observation Stay (HOSPITAL_COMMUNITY): Payer: Medicaid Other

## 2012-07-15 ENCOUNTER — Encounter (HOSPITAL_COMMUNITY): Payer: Self-pay | Admitting: *Deleted

## 2012-07-15 DIAGNOSIS — R4182 Altered mental status, unspecified: Secondary | ICD-10-CM | POA: Diagnosis present

## 2012-07-15 DIAGNOSIS — K7290 Hepatic failure, unspecified without coma: Principal | ICD-10-CM | POA: Diagnosis present

## 2012-07-15 LAB — CBC
Platelets: 225 10*3/uL (ref 150–400)
RBC: 2.87 MIL/uL — ABNORMAL LOW (ref 3.87–5.11)
RDW: 13.7 % (ref 11.5–15.5)
WBC: 9.2 10*3/uL (ref 4.0–10.5)

## 2012-07-15 LAB — BASIC METABOLIC PANEL
CO2: 19 mEq/L (ref 19–32)
Calcium: 9.9 mg/dL (ref 8.4–10.5)
Chloride: 102 mEq/L (ref 96–112)
Creatinine, Ser: 6.23 mg/dL — ABNORMAL HIGH (ref 0.50–1.10)
GFR calc Af Amer: 9 mL/min — ABNORMAL LOW (ref >90)
Sodium: 140 mEq/L (ref 135–145)

## 2012-07-15 LAB — HEPATITIS C ANTIBODY (REFLEX): HCV Ab: NEGATIVE

## 2012-07-15 LAB — URINE CULTURE: Colony Count: NO GROWTH

## 2012-07-15 LAB — HIV ANTIBODY (ROUTINE TESTING W REFLEX): HIV: NONREACTIVE

## 2012-07-15 LAB — AMMONIA: Ammonia: 54 umol/L (ref 11–60)

## 2012-07-15 LAB — HEPATITIS B SURFACE ANTIBODY,QUALITATIVE: Hep B S Ab: NEGATIVE

## 2012-07-15 LAB — T4, FREE: Free T4: 0.5 ng/dL — ABNORMAL LOW (ref 0.80–1.80)

## 2012-07-15 LAB — T3, FREE: T3, Free: 1 pg/mL — ABNORMAL LOW (ref 2.3–4.2)

## 2012-07-15 MED ORDER — INFLUENZA VIRUS VACC SPLIT PF IM SUSP
0.5000 mL | INTRAMUSCULAR | Status: AC
  Administered 2012-07-16: 0.5 mL via INTRAMUSCULAR
  Filled 2012-07-15: qty 0.5

## 2012-07-15 MED ORDER — PNEUMOCOCCAL VAC POLYVALENT 25 MCG/0.5ML IJ INJ
0.5000 mL | INJECTION | INTRAMUSCULAR | Status: AC
  Administered 2012-07-16: 0.5 mL via INTRAMUSCULAR
  Filled 2012-07-15: qty 0.5

## 2012-07-15 NOTE — Progress Notes (Signed)
Family Medicine Teaching Service Daily Progress Note Service Page: [phone]  Patient Assessment: 46 y/o female with AMS, renal insufficiency, and hyperammonemia.   Subjective:  No acute events overnight. Sister feels that she is speaking more clearly and more alert this AM although not at baseline. Slept well, good appetite this AM.   Objective: Temp:  [97.2 F (36.2 C)-98.5 F (36.9 C)] 97.6 F (36.4 C) (09/23 0600) Pulse Rate:  [79-95] 94  (09/23 0600) Resp:  [16-22] 20  (09/23 0600) BP: (122-148)/(70-113) 137/88 mmHg (09/23 0600) SpO2:  [98 %-100 %] 100 % (09/23 0600) Weight:  [217 lb 2.5 oz (98.5 kg)-218 lb 4.1 oz (99 kg)] 217 lb 2.5 oz (98.5 kg) (09/23 0600) Exam: Gen: NAD, sitting up in bed eating HEENT: NCAT oral mucosa moist and pink, no adenopathy  CV: RRR, no murmur  Resp: mild wheezes throughout, good air movement, non-labored, loud mouth breathing/snoring  Abd: SNTND, BS present, no guarding  Ext: 2+ pitting edema on BL LE, No pain in digits of BL feet,  Neuro: Improved MS this Am  Speech understandable about 50% of the time  Alert  Moving all four limbs spontaneously, walking well  Withdraws to babinski- uanble to assess, no asterixis    I have reviewed the patient's medications, labs, imaging, and diagnostic testing.  Notable results are summarized below.  CBC BMET   Lab 07/15/12 0601 07/14/12 1540  WBC 9.2 9.2  HGB 9.7* 10.1*  HCT 28.9* 30.0*  PLT 225 238    Lab 07/15/12 0601 07/14/12 1540  NA 140 137  K 4.0 4.4  CL 102 97  CO2 19 18*  BUN 82* 83*  CREATININE 6.23* 6.54*  GLUCOSE 78 124*  CALCIUM 9.9 10.2     Imaging/Diagnostic Tests: Valproic acid level Q000111Q  Salicylate 123456  Acetamenophen <15.0   Utox negative   UA cloudy, moderate leukocyte esterase  Ammonia 54  DG Chest 07/14/2012  IMPRESSION: Very low lung volumes with crowding of markings and/or pulmonary vascular congestion. However, no overt pulmonary edema or focal pneumonia  identified.  CT Head 07/14/2012  IMPRESSION: Normal noncontrast CT appearance of the brain.     Plan: Jane [name] is a 46 y.o. year old female with chronic renal insufficiency presenting with altered mental status for 2 days. She has hyperammoniemia that explains her AMS and decreased renal function that partially explains her hyperammonemia. Other DDx for hyperammonemia includes chronic hepatitis, infection, and ingestion.  1. Altered Mental Status/Hepatic encephalopathy: Symptoms for 1 day, ammonia 148, albumin 2.8.  1. Utox negative, serum salicylates and tylenol negative 2. UA cloudy with Mod leuk esterase, but no recent complaints of dysuria 3. PT/INR WNL,  4. Hep C Ab, Heb B, HIV- all pending 5. Lactulose 30 g last night, ammonia improved to 54 6. US Abdomen pending, Renal US pending  2. Renal insufficiency: Acute vs. Chronic- previously 2.76 in Dec 2011, 6.54 today.  1. Gentle hydration: NS at 50 ml/hr, renal US in AM.  2. Renal consulted last night by phone, appreciate their reccomendations.  3. See Dr. Lorrene Reid OP, will plan to contact and acquire lab value over the last 2 years.   3. Schizoaffective disorder: Unclear as to its contribution to altered mental status  1. Hold risperdal 2. Monitor   4. Seizure disorder: No recent seizure activity  1. Depakote level therapeutic, will hold Depakote for now to see if it is playing into her AMS 2. Possibly start keppra later this afternoon, vs restarting  depakote pending liver studie/  3. Monitor  5. FEN/GI: Renal diet 6. Prophylaxis: SCDs 7. Disposition: Home pending improvement in mental status 8. Code Status: Full for now. Family unsure and will discuss   Kenn File, MD 07/15/2012, 6:59 AM

## 2012-07-15 NOTE — Care Management Note (Signed)
    Page 1 of 1   07/18/2012     10:56:03 AM   CARE MANAGEMENT NOTE 07/18/2012  Patient:  Jane [name], Jane [name]   Account Number:  [id number]  Date Initiated:  07/15/2012  Documentation initiated by:  Lars Pinks  Subjective/Objective Assessment:   PT WAS ADMITTED WITH LIGHT HEADEDNESS AND SLOWER AMBULATION     Action/Plan:   PROGRESSION OF CARE AND DISCHARGE PLANNING   Anticipated DC Date:  07/17/2012   Anticipated DC Plan:  [place]  CM consult      Choice offered to / List presented to:             Status of service:  Completed, signed off Medicare Important Message given?   (If response is "NO", the following Medicare IM given date fields will be blank) Date Medicare IM given:   Date Additional Medicare IM given:    Discharge Disposition:  HOME/SELF CARE  Per UR Regulation:  Reviewed for med. necessity/level of care/duration of stay  If discussed at [place] of Stay Meetings, dates discussed:    Comments:  07/15/12 Lars Pinks, RN, BSN 1533 PT WAS ADMITTED FOR LIGHT HEADEDNESS, PTA PT WAS AT [facility].  WILL F/U ON DC NEEDS AND RECOMMENDATIONS.

## 2012-07-15 NOTE — Progress Notes (Signed)
I have seen and examined this patient. I have discussed with Dr Wendi Snipes.  I agree with their findings and plans as documented in their progress note.  Acute Issues 1. Lethargy - Possible causal contributions from Valproate-related hepatic encephalopathy, free-Valproate toxicity in setting of Renal failure, Acute renal failure, anticholinergic or antidopaminergic medications (solifenacin and risperidone).  2. Acute Renal Failure in CKD - Urine culture negative - No known NSAIDS/ACEI/ARB/Contrast exposure -  Medicorenal disease by [place].  No evidence of obstuction on [place] - Possible Volume depletion/Pre-renal from poor Per oral intake recently.  Keep hepatorenal syndrome in mind if patient's GFR does not return to baseline with fluid resuscitation.  - Plan: F/U Renal service consultation recommendations, gentle IVF hydration, stop Valproic acid, assess Seizure history and need for AED, consider trial of carnitine therapy. Agree with lactulose to two soft stools a day. F/U viral hepatitis serologies.    3. Sleep apnea, obstructive - Patient's CPAP inoperative for several years per pt's sister.. - Restart home CPAP at 10 cm H2O pressure - Consult Care Manager to assist with getting new CPAP at home for patient.

## 2012-07-15 NOTE — Consult Note (Signed)
[facility] CONSULT NOTE    Date: 07/15/2012                  Patient Name:  Jane [name]  MRN: [id number]  DOB: 1966-10-09  Age / Sex: 46 y.o., female         PCP: Jane Hook, MD                 Service Requesting Consult: Family Medicine Teaching Service                 Reason for Consult: Acute on Chronic Renal Injury            History of Present Illness: Patient is a 46 y.o. female with CKD stage IV, mental retardation, schizoaffective disorder, hypothyroidism, and gout who was admitted to [facility] on 07/14/2012 for evaluation of altered mental status. She was found to be encephalopathic with an ammonia level of  148, which was treated with lactulose. Additionally, she was found to an acute on chronic kidney injury evidence by a creatinine of 6.5, BUN of 83, and GFR of 9. The patient's baseline creatinine is 3.61 according to her records from [place] where she is a patient of Dr. Lorrene [name]. The patient cannot provide any history based on her baseline cognitive level, but her sister is her guardian and full time care giver. Her sister notes that the patient has not had any recent changes in her medication, except she took colchicine three times a day for a week for a gout flare. This was stopped last week. Otherwise, the patient's sister was not giving her any NSAIDs and also denies any recent weight gain or leg swelling of the patient. Her sister also states that [name] has a high level of PO fluid intake during the days and makes urine without problems.   Medications: Outpatient medications: Prescriptions prior to admission  Medication Sig Dispense Refill  . cetirizine (ZYRTEC) 10 MG tablet Take 10 mg by mouth daily.      . Cholecalciferol (VITAMIN D3) 2000 UNITS capsule Take 2,000 Units by mouth daily.      . colchicine 0.6 MG tablet Take 0.3 mg by mouth 2 (two) times daily as needed. For foot pain      . divalproex (DEPAKOTE ER) 500 MG  24 hr tablet Take 1,500 mg by mouth at bedtime.      . ferrous sulfate 325 (65 FE) MG EC tablet Take 325 mg by mouth daily with breakfast.      . gemfibrozil (LOPID) 600 MG tablet Take 600 mg by mouth 2 (two) times daily before a meal.      . levothyroxine (SYNTHROID, LEVOTHROID) 50 MCG tablet Take 50 mcg by mouth daily.      . metoprolol succinate (TOPROL-XL) 100 MG 24 hr tablet Take 100 mg by mouth daily. Take with or immediately following a meal.      . risperiDONE (RISPERDAL) 3 MG tablet Take 3 mg by mouth at bedtime.      . solifenacin (VESICARE) 5 MG tablet Take 10 mg by mouth daily.        Current medications: Current Facility-Administered Medications  Medication Dose Route Frequency Provider Last Rate Last Dose  . 0.9 %  sodium chloride infusion   Intravenous Continuous Timmothy Euler, MD      . influenza  inactive virus vaccine (FLUZONE/FLUARIX) injection 0.5 mL  0.5 mL Intramuscular Tomorrow-1000 Willeen Niece, MD      .  lactulose (CHRONULAC) 10 GM/15ML solution 30 g  30 g Oral Once Barbara Cower, MD   30 g at 07/14/12 2056  . levothyroxine (SYNTHROID, LEVOTHROID) tablet 50 mcg  50 mcg Oral QAC breakfast Timmothy Euler, MD      . loratadine (CLARITIN) tablet 10 mg  10 mg Oral Daily Timmothy Euler, MD      . LORazepam (ATIVAN) injection 2 mg  2 mg Intravenous Once Barbara Cower, MD      . metoprolol succinate (TOPROL-XL) 24 hr tablet 100 mg  100 mg Oral Daily Timmothy Euler, MD      . pneumococcal 23 valent vaccine (PNU-IMMUNE) injection 0.5 mL  0.5 mL Intramuscular Tomorrow-1000 Willeen Niece, MD      . sodium chloride 0.9 % bolus 500 mL  500 mL Intravenous Once Barbara Cower, MD   500 mL at 07/14/12 1911  . DISCONTD: acetaminophen (TYLENOL) suppository 650 mg  650 mg Rectal Q6H PRN Timmothy Euler, MD      . DISCONTD: acetaminophen (TYLENOL) tablet 650 mg  650 mg Oral Q6H PRN Timmothy Euler, MD      . DISCONTD: cholecalciferol (VITAMIN D) tablet 2,000  Units  2,000 Units Oral Daily Willeen Niece, MD      . DISCONTD: darifenacin (ENABLEX) 24 hr tablet 7.5 mg  7.5 mg Oral Daily Timmothy Euler, MD      . DISCONTD: divalproex (DEPAKOTE ER) 24 hr tablet 1,500 mg  1,500 mg Oral QHS Timmothy Euler, MD   1,500 mg at 07/14/12 2208  . DISCONTD: ferrous sulfate EC tablet 325 mg  325 mg Oral Q breakfast Timmothy Euler, MD      . DISCONTD: ferrous sulfate tablet 325 mg  325 mg Oral Q breakfast Willeen Niece, MD      . DISCONTD: gemfibrozil (LOPID) tablet 600 mg  600 mg Oral BID AC Timmothy Euler, MD      . DISCONTD: risperiDONE (RISPERDAL) tablet 3 mg  3 mg Oral QHS Timmothy Euler, MD   3 mg at 07/14/12 2208  . DISCONTD: Vitamin D3 2,000 Units  2,000 Units Oral Daily Timmothy Euler, MD          Allergies: No Known Allergies    Past Medical History: Past Medical History  Diagnosis Date  . Renal disease   . Moderately mentally retarded   . Hypertension   . Anemia   . Hyperlipemia      Past Surgical History: Past Surgical History  Procedure Date  . Breast reduction surgery   . Foot surgery      Family History: No family history on file.   Social History: History   Social History  . Marital Status: Single    Spouse Name: N/A    Number of Children: N/A  . Years of Education: N/A   Occupational History  . Not on file.   Social History Main Topics  . Smoking status: Never Smoker   . Smokeless tobacco: Not on file  . Alcohol Use: No  . Drug Use: No  . Sexually Active:    Other Topics Concern  . Not on file   Social History Narrative  . No narrative on file     Review of Systems: As per HPI  Vital Signs: Blood pressure 137/88, pulse 94, temperature 97.6 F (36.4 C), temperature source Oral, resp. rate 20, height 5\' 4"  (1.626 m), weight 217 lb 2.5 oz (98.5 kg), SpO2  100.00%.  Weight trends: Filed Weights   07/14/12 2200 07/15/12 0111 07/15/12 0600  Weight: 218 lb 4.1 oz (99 kg) 218 lb 4.1 oz (99  kg) 217 lb 2.5 oz (98.5 kg)    Physical Exam: General: Vital signs reviewed and noted. Well-developed, well-nourished, not interactive during the exam, flat affect   Head: Normocephalic, atraumatic., moon facies   Eyes: PERRL, EOMI, mild exophthlamos, No signs of anemia or jaundince.  Nose: Mucous membranes moist, not inflammed, nonerythematous.  Throat: Oropharynx nonerythematous, no exudate appreciated.   Neck: No deformities, masses, or tenderness noted.Supple, No carotid Bruits, no JVD.  Lungs:  Normal respiratory effort. Very noisy upper airway breathing at baseline. Clear to auscultation BL without crackles or wheezes.  Heart: RRR. S1 and S2 normal without gallop, murmur, or rubs.  Abdomen:  BS normoactive. Soft, Nondistended, non-tender.  No masses or organomegaly.  Extremities: No pretibial edema.  Neurologic: Alert, but difficult to assess orientation; not compliant with neuro exam   Skin: No visible rashes, scars.    Lab results: Basic Metabolic Panel:  Lab [id number] 0601 07/14/12 1540  NA 140 137  K 4.0 4.4  CL 102 97  CO2 19 18*  GLUCOSE 78 124*  BUN 82* 83*  CREATININE 6.23* 6.54*  CALCIUM 9.9 10.2  MG -- --  PHOS -- --    Liver Function Tests:  Lab 07/14/12 1540  AST 11  ALT <5  ALKPHOS 69  BILITOT 0.4  PROT 7.3  ALBUMIN 2.8*   No results found for this basename: LIPASE:3,AMYLASE:3 in the last 168 hours  Lab 07/15/12 0822 07/14/12 1737  AMMONIA 54 148*    CBC:  Lab 07/15/12 0601 07/14/12 1540  WBC 9.2 9.2  NEUTROABS -- 6.8  HGB 9.7* 10.1*  HCT 28.9* 30.0*  MCV 100.7* 99.7  PLT 225 238    Cardiac Enzymes: No results found for this basename: CKTOTAL:5,CKMB:5,CKMBINDEX:5,TROPONINI:5 in the last 168 hours  BNP: No components found with this basename: POCBNP:3  CBG:  Lab 07/14/12 1543  GLUCAP 120*    Microbiology:   Coagulation Studies:  Basename 07/14/12 2133  LABPROT 15.1  INR 1.21    Urinalysis:  Basename 07/14/12 2005    COLORURINE YELLOW  LABSPEC 1.010  PHURINE 6.0  GLUCOSEU NEGATIVE  HGBUR NEGATIVE  BILIRUBINUR NEGATIVE  KETONESUR NEGATIVE  PROTEINUR NEGATIVE  UROBILINOGEN 1.0  NITRITE NEGATIVE  LEUKOCYTESUR MODERATE*      Imaging: Ct Head Wo Contrast  07/14/2012  *RADIOLOGY REPORT*  Clinical Data: 46 year old female with increased weakness.  CT HEAD WITHOUT CONTRAST  Technique:  Contiguous axial images were obtained from the base of the skull through the vertex without contrast.  Comparison: None.  Findings: Visualized paranasal sinuses and mastoids are clear. Visualized orbit soft tissues are within normal limits.  Visualized scalp soft tissues are within normal limits.  No acute osseous abnormality identified.  Normal cerebral volume.  No ventriculomegaly. No midline shift, mass effect, or evidence of mass lesion.  No acute intracranial hemorrhage identified.  No evidence of cortically based acute infarction identified.  No suspicious intracranial vascular hyperdensity. Gray-white matter differentiation is within normal limits throughout the brain.  IMPRESSION: Normal noncontrast CT appearance of the brain.   Original Report Authenticated By: Randall An, M.D.    US Abdomen Complete  07/15/2012  *RADIOLOGY REPORT*  Clinical Data:  Elevated ammonia, evaluate for liver disease.  COMPLETE ABDOMINAL ULTRASOUND  Comparison:  06/01/2010 renal ultrasound  Findings:  Gallbladder:  Gallstones.  No gallbladder  wall thickening or pericholecystic fluid.  Negative sonographic Murphy's sign.  Common bile duct:  Measures 7 mm proximally, which is mildly prominent for age.  This is nonspecific.  The distal duct is not visualized adequately due to artifact/limited acoustic window.  Liver:  Heterogeneous/increased echogenicity.  No focal abnormality identified.  IVC:  Appears normal.  Pancreas:  No focal abnormality seen.  Spleen:  Measures 6.3 cm oblique.  No focal abnormality.  Right Kidney:  Diffusely increased in  echogenicity.  1.3 cm interpolar/lower pole hypoechoic/nearly anechoic lesion with increased through transmission and no appreciable complexity otherwise, favored to reflect a mildly complex (hemorrhagic/proteinaceous) cyst. No hydronephrosis.  Measures 11.3 cm in maximal diameter.  Left Kidney:  Density.  No hydronephrosis or focal abnormality. Measures 10.3 cm in maximal diameter.  Abdominal aorta:  No aneurysm identified. The distal aorta is obscured by bowel gas artifact.  IMPRESSION: Cholelithiasis without sonographic evidence for cholecystitis.  Mild CBD prominence proximally at 7 mm.  The distal duct is obscured.  Correlate with LFTs and ERCP or MRCP if clinically concerned for an obstructing biliary process.  Heterogeneous liver echogenicity can be seen in the setting of steatosis or hepatitis.  No focal abnormality identified.  No overt cirrhotic change.  Increased renal parenchymal echogenicity bilaterally suggest underlying medical renal disease.  No hydronephrosis.  1.3 cm hypoechoic/nearly anechoic lesion within the right kidney is favored to be a mildly complex cyst.   Original Report Authenticated By: Suanne Marker, M.D.    Dg Chest Port 1 View  07/14/2012  *RADIOLOGY REPORT*  Clinical Data: 46 year old female shortness of breath pneumonia.  PORTABLE CHEST - 1 VIEW  Comparison: None.  Findings: Semi upright AP portable view 1820 hours.  Low lung volumes with crowding of markings.  Allowing for this,  Cardiac size and mediastinal contours are within normal limits.  Visualized tracheal air column is within normal limits.  No definite effusion. No pneumothorax.  No definite pulmonary edema or consolidation.  IMPRESSION: Very low lung volumes with crowding of markings and/or pulmonary vascular congestion.  However, no overt pulmonary edema or focal pneumonia identified.   Original Report Authenticated By: Randall An, M.D.       Assessment & Plan: Pt is a 46 y.o. yo female with CKD stage  IV, mental retardation, schizoaffective disorder, hypothyroidism, and gout who presented with encephalopathy and acute kidney injury.   Encephalopathy: This is being attributed to hyperammonemia and treated with lactulose by the primary team. It could also be secondary to antipsychotics or pain meds prescribed by her PCP Doristine Section Bonsu.   Acute Kidney Injury: Has CKD stage IV with baseline Cr. 3.6, BUN 40 from May 2012. Upon presentation her GFR was < 10, Creat 6.5 and BUN 84. She is also showing signs of uremia. The origin of the injury is possibly secondary to colchicine use. She does seem volume depleted, so it's less likely pre-renal, and the ultrasound has ruled out obstruction. This will hopefully be a reversible injury, as the patient is not a candidate for dialysis secondary to her baseline cognitive functioning. This has been discussed at previous outpatient visits with the patient's sister and father.  - Hold colchicine and avoid diuretics  - Agree with gentle hydration per primary team  - strict I/O, but no Foley right now so as not to agitate the patient - agree with renal diet - daily labs, given CKD, we will check PTH and magnesium  Anemia : Baseline Hgb 11.8., currently 9.7.  This is likely secondary to CKD.  - consider addition of aranesp - consider checking Fe panel  Bone Health: Calcium 9.9. Phos not measured - Hold vit D  - check renal panel in the AM  UTI: Moderate leukocytes on UA. Presence of infection could cause or exacerbate AKI.  - follow up urine culture  DVT PPX: SCD's per primary team  We appreciate this interesting consultation.   Jane Chancy Maricela Bo, MD  I have seen and examined this patient and agree with the plan of care seen and eval.  Renal function stable.  Less meds allowing ^ MS.  GFR change not large and may not recover. .  Jane [name] 07/16/2012, 10:34 AM

## 2012-07-16 LAB — MAGNESIUM: Magnesium: 2.8 mg/dL — ABNORMAL HIGH (ref 1.5–2.5)

## 2012-07-16 LAB — BASIC METABOLIC PANEL
BUN: 82 mg/dL — ABNORMAL HIGH (ref 6–23)
Calcium: 10.4 mg/dL (ref 8.4–10.5)
GFR calc Af Amer: 9 mL/min — ABNORMAL LOW (ref >90)
GFR calc non Af Amer: 7 mL/min — ABNORMAL LOW (ref >90)
Potassium: 4 mEq/L (ref 3.5–5.1)

## 2012-07-16 LAB — CBC
HCT: 30.4 % — ABNORMAL LOW (ref 36.0–46.0)
MCHC: 32.9 g/dL (ref 30.0–36.0)
RDW: 13.8 % (ref 11.5–15.5)

## 2012-07-16 LAB — RETICULOCYTES
RBC.: 3.27 MIL/uL — ABNORMAL LOW (ref 3.87–5.11)
Retic Count, Absolute: 49.1 10*3/uL (ref 19.0–186.0)
Retic Ct Pct: 1.5 % (ref 0.4–3.1)

## 2012-07-16 LAB — PARATHYROID HORMONE, INTACT (NO CA): PTH: 200.8 pg/mL — ABNORMAL HIGH (ref 14.0–72.0)

## 2012-07-16 MED ORDER — RISPERIDONE 3 MG PO TABS
3.0000 mg | ORAL_TABLET | Freq: Every day | ORAL | Status: DC
  Administered 2012-07-16: 3 mg via ORAL
  Filled 2012-07-16 (×2): qty 1

## 2012-07-16 NOTE — Progress Notes (Signed)
[facility] Progress Note    Subjective:   Patient tired, Her sister is presents and believes that Jane [name] is feeling better. She awoke several times at night to urinate.    Objective:   BP 132/68  Pulse 92  Temp 97.8 F (36.6 C) (Oral)  Resp 18  Ht 5\' 4"  (1.626 m)  Wt 214 lb 8.1 oz (97.3 kg)  BMI 36.82 kg/m2  SpO2 99%  Intake/Output Summary (Last 24 hours) at 07/16/12 1103 Last data filed at 07/16/12 1046  Gross per 24 hour  Intake    640 ml  Output      0 ml  Net    640 ml   Weight change: -3 lb 12 oz (-1.7 kg)  Physical Exam: General: VSS. Well-developed, well-nourished, not interactive during the exam, flat affect  Head: Normocephalic, atraumatic., moon facies  Eyes: PERRL, EOMI, mild exophthlamos, No signs of anemia or jaundince.  Mouth: Oropharynx nonerythematous, no exudate appreciated.  Neck: No deformities, masses, or tenderness noted.Supple, No carotid Bruits, no JVD.  Lungs: Normal respiratory effort. Very noisy upper airway breathing at baseline. Clear to auscultation BL without crackles or wheezes.  Heart: RRR. S1 and S2 normal without gallop, murmur, or rubs.  Abdomen: BS normoactive. Soft, Nondistended, non-tender. No masses or organomegaly.  Extremities: No pretibial edema.  Neurologic: Alert, but difficult to assess orientation; not compliant with neuro exam  Skin: No visible rashes, scars.    Imaging: Ct Head Wo Contrast  07/14/2012  *RADIOLOGY REPORT*  Clinical Data: 46 year old female with increased weakness.  CT HEAD WITHOUT CONTRAST  Technique:  Contiguous axial images were obtained from the base of the skull through the vertex without contrast.  Comparison: None.  Findings: Visualized paranasal sinuses and mastoids are clear. Visualized orbit soft tissues are within normal limits.  Visualized scalp soft tissues are within normal limits.  No acute osseous abnormality identified.  Normal cerebral volume.  No ventriculomegaly. No  midline shift, mass effect, or evidence of mass lesion.  No acute intracranial hemorrhage identified.  No evidence of cortically based acute infarction identified.  No suspicious intracranial vascular hyperdensity. Gray-white matter differentiation is within normal limits throughout the brain.  IMPRESSION: Normal noncontrast CT appearance of the brain.   Original Report Authenticated By: Randall An, M.D.    US Abdomen Complete  07/15/2012  *RADIOLOGY REPORT*  Clinical Data:  Elevated ammonia, evaluate for liver disease.  COMPLETE ABDOMINAL ULTRASOUND  Comparison:  06/01/2010 renal ultrasound  Findings:  Gallbladder:  Gallstones.  No gallbladder wall thickening or pericholecystic fluid.  Negative sonographic Murphy's sign.  Common bile duct:  Measures 7 mm proximally, which is mildly prominent for age.  This is nonspecific.  The distal duct is not visualized adequately due to artifact/limited acoustic window.  Liver:  Heterogeneous/increased echogenicity.  No focal abnormality identified.  IVC:  Appears normal.  Pancreas:  No focal abnormality seen.  Spleen:  Measures 6.3 cm oblique.  No focal abnormality.  Right Kidney:  Diffusely increased in echogenicity.  1.3 cm interpolar/lower pole hypoechoic/nearly anechoic lesion with increased through transmission and no appreciable complexity otherwise, favored to reflect a mildly complex (hemorrhagic/proteinaceous) cyst. No hydronephrosis.  Measures 11.3 cm in maximal diameter.  Left Kidney:  Density.  No hydronephrosis or focal abnormality. Measures 10.3 cm in maximal diameter.  Abdominal aorta:  No aneurysm identified. The distal aorta is obscured by bowel gas artifact.  IMPRESSION: Cholelithiasis without sonographic evidence for cholecystitis.  Mild CBD prominence proximally at 7  mm.  The distal duct is obscured.  Correlate with LFTs and ERCP or MRCP if clinically concerned for an obstructing biliary process.  Heterogeneous liver echogenicity can be seen in the  setting of steatosis or hepatitis.  No focal abnormality identified.  No overt cirrhotic change.  Increased renal parenchymal echogenicity bilaterally suggest underlying medical renal disease.  No hydronephrosis.  1.3 cm hypoechoic/nearly anechoic lesion within the right kidney is favored to be a mildly complex cyst.   Original Report Authenticated By: Suanne Marker, M.D.    Dg Chest Port 1 View  07/14/2012  *RADIOLOGY REPORT*  Clinical Data: 46 year old female shortness of breath pneumonia.  PORTABLE CHEST - 1 VIEW  Comparison: None.  Findings: Semi upright AP portable view 1820 hours.  Low lung volumes with crowding of markings.  Allowing for this,  Cardiac size and mediastinal contours are within normal limits.  Visualized tracheal air column is within normal limits.  No definite effusion. No pneumothorax.  No definite pulmonary edema or consolidation.  IMPRESSION: Very low lung volumes with crowding of markings and/or pulmonary vascular congestion.  However, no overt pulmonary edema or focal pneumonia identified.   Original Report Authenticated By: Randall An, M.D.     Labs: BMET  Lab 07/16/12 0548 07/15/12 0601 07/14/12 1540  NA 143 140 137  K 4.0 4.0 4.4  CL 104 102 97  CO2 19 19 18*  GLUCOSE 87 78 124*  BUN 82* 82* 83*  CREATININE 6.20* 6.23* 6.54*  ALB -- -- --  CALCIUM 10.4 9.9 10.2  PHOS -- -- --   CBC  Lab 07/16/12 0548 07/15/12 0601 07/14/12 1540  WBC 7.4 9.2 9.2  NEUTROABS -- -- 6.8  HGB 10.0* 9.7* 10.1*  HCT 30.4* 28.9* 30.0*  MCV 99.7 100.7* 99.7  PLT 251 225 238    Medications:      . influenza  inactive virus vaccine  0.5 mL Intramuscular Tomorrow-1000  . levothyroxine  50 mcg Oral QAC breakfast  . loratadine  10 mg Oral Daily  . LORazepam  2 mg Intravenous Once  . metoprolol succinate  100 mg Oral Daily  . pneumococcal 23 valent vaccine  0.5 mL Intramuscular Tomorrow-1000     Assessment/ Plan:   Pt is a 46 y.o. yo female with CKD stage IV,  mental retardation, schizoaffective disorder, hypothyroidism, and gout who presented with encephalopathy and acute kidney injury.   Encephalopathy: This is being attributed to hyperammonemia and treated with lactulose by the primary team. It could also be secondary to antipsychotics or pain meds prescribed by her PCP Jane [name].   Acute Kidney Injury: Has CKD stage IV with baseline Cr. 3.6, BUN 40 from May 2012. Upon presentation her GFR was < 10, Creat 6.5 and BUN 84. She is also showing signs of uremia. The origin of the injury is possibly secondary to colchicine use. She does seem volume depleted, so it's less likely pre-renal, and the ultrasound has ruled out obstruction. This will hopefully be a reversible injury, as the patient is not a candidate for dialysis secondary to her baseline cognitive functioning. This has been discussed at previous outpatient visits with the patient's sister and father.   - Creatinine still high and BUN high as well - No changes today as this may be patient's new baseline, currently she has no electrolyte disturbances or evidence of overload - Hold colchicine and avoid diuretics  - Agree with gentle hydration per primary team  - strict I/O, but  no Foley right now so as not to agitate the patient  - agree with renal diet  - daily labs, given CKD, we will check PTH and magnesium   Anemia : Baseline Hgb 11.8., currently 9.7. This is likely secondary to CKD.  - consider addition of aranesp  - consider checking Fe panel   Bone Health: Calcium 9.9. Phos not measured  - Hold vit D  - check renal panel in the AM   DVT PPX: SCD's per primary team   We appreciate this interesting consultation.   Marena Chancy Maricela Bo, MD    07/16/2012, 11:03 AM   I have seen and examined this patient and agree with the plan of care seen and eval.  Not sure renal function will recover as may be part of expected progression.  Needs to discuss EOL issues.  MS better with less  meds. .  Aitana Burry L 07/16/2012, 12:23 PM

## 2012-07-16 NOTE — Progress Notes (Signed)
RT consulted with patient and family to put on CPAP. Patient is very incompliant with the machine and screams when you put it on her. There is history of altered mental state, OSA.  Her Sats remain above 96-97% while sleeping. Patient wore for thirty minutes last night and refused the last two time RT has tried to put it on. RT will talk with nurse and consult with supervisor. RT will continue to monitor

## 2012-07-16 NOTE — Progress Notes (Signed)
Family Medicine Teaching Service Daily Progress Note Service Page: [phone]  Patient Assessment: 46 y/o female with AMS, renal insufficiency, and hyperammonemia.   Subjective:  No acute events overnight. Sister feels that she is speaking more clearly and more alert this AM and nearly at her baseline. She states she is sure that her sister does not have a seizure disorder. No behavior changes, still walking a little slower than usual.   Objective: Temp:  [97.8 F (36.6 C)-98.5 F (36.9 C)] 97.8 F (36.6 C) (09/24 0600) Pulse Rate:  [79-93] 92  (09/24 0600) Resp:  [16-18] 18  (09/24 0600) BP: (122-142)/(68-89) 132/68 mmHg (09/24 0600) SpO2:  [98 %-99 %] 99 % (09/24 0600) FiO2 (%):  [21 %] 21 % (09/23 2227) Weight:  [214 lb 8.1 oz (97.3 kg)] 214 lb 8.1 oz (97.3 kg) (09/24 0600) Exam: Gen: NAD, lying in bed awake, faalling asleep quickly and easily, arouses with some encouragment HEENT: NCAT oral mucosa moist and pink, no adenopathy  CV: RRR, no murmur, 2+ DP and radial pulses Resp: CTAB, good air movement, non-labored, loud mouth breathing/snoring  Abd: SNTND, BS present, no guarding  Ext: 2+ pitting edema on BL LE, No pain in digits of BL feet,  Neuro: Speech understandable about 50% of the time  Alert when awake.   Moving all four limbs spontaneously     I have reviewed the patient's medications, labs, imaging, and diagnostic testing.  Notable results are summarized below.  CBC BMET   Lab 07/16/12 0548 07/15/12 0601 07/14/12 1540  WBC 7.4 9.2 9.2  HGB 10.0* 9.7* 10.1*  HCT 30.4* 28.9* 30.0*  PLT 251 225 238    Lab 07/15/12 0601 07/14/12 1540  NA 140 137  K 4.0 4.4  CL 102 97  CO2 19 18*  BUN 82* 83*  CREATININE 6.23* 6.54*  GLUCOSE 78 124*  CALCIUM 9.9 10.2     Imaging/Diagnostic Tests: Valproic acid level Q000111Q  Salicylate 123456  Acetamenophen <15.0   Utox negative   UA cloudy, moderate leukocyte esterase  Ammonia 54  DG Chest 07/14/2012  IMPRESSION: Very  low lung volumes with crowding of markings and/or pulmonary vascular congestion. However, no overt pulmonary edema or focal pneumonia identified.  CT Head 07/14/2012  IMPRESSION: Normal noncontrast CT appearance of the brain.  US abdomen complete 07/15/2012 IMPRESSION: Cholelithiasis without sonographic evidence for cholecystitis.  Mild CBD prominence proximally at 7 mm. The distal duct is obscured. Correlate with LFTs and ERCP or MRCP if clinically concerned for an obstructing biliary process.  Heterogeneous liver echogenicity can be seen in the setting of steatosis or hepatitis. No focal abnormality identified. No overt cirrhotic change.  Increased renal parenchymal echogenicity bilaterally suggest underlying medical renal disease. No hydronephrosis.  1.3 cm hypoechoic/nearly anechoic lesion within the right kidney is favored to be a mildly complex cyst.   Plan: Jane [name] is a 46 y.o. year old female with chronic renal insufficiency presenting with altered mental status for 2 days. She has hyperammoniemia that explains her AMS and decreased renal function that partially explains her hyperammonemia. Other DDx for hyperammonemia includes chronic hepatitis, infection, and ingestion.  1. Altered Mental Status/Hepatic encephalopathy: after work up detailed below its likely that depakote is the cause of her current hyperammonemia leading to the AMS. Will continue to try and find out what type of seizures so we can adequately prophylax them.  1. Utox negative, serum salicylates and tylenol negative 2. ammonia 148 improved to 50s  after one  dose of lactuulose , albumin 2.8.  3. UA cloudy with Mod leuk esterase, culture negative 4. PT/INR WNL,  5. Hep C Ab, Heb B, HIV- all negative 6. Lactulose 30 g last night, ammonia improved to 54 7. US Abdomen/ renal US no acute findings.  8. Consider neuro consult  2. Renal insufficiency: Acute vs. Chronic- baseline Cre is 3.6 from may 2012, 6.5  on admission and 6.20 today.  1. Renal on board- appreciate their reccomendations.  2. Contiinue Gentle hydration: NS at 50 ml/hr per their reccs, caboid NSADs, colchocone, and diuretics 3. Sees Dr. Lorrene Reid OP, creatinines for the last year being faxed  3. Schizoaffective disorder: Unclear as to its contribution to altered mental status  1. Hold risperdal 2. Monitor   4. Seizure disorder: No recent seizure activity, family unaware of disoreder and depakote started prior to 2009.   1. Depakote level therapeutic, will hold Depakote as its most likely cause of hyperammonemia 2. Would like more info on her disorder as its unknown to her family and cant find any notes with description, will continue looking for them- records requested from [facility].  3. Monitor, plan ativan 2-4 mg for seizures lasting longer than 5 min  5. FEN/GI: Renal diet 6. Prophylaxis: SCDs 7. Disposition: Home pending improvement in mental status 8. Code Status: Full for now. Family unsure and will discuss   Kenn File, MD 07/16/2012, 7:23 AM

## 2012-07-16 NOTE — Progress Notes (Signed)
Utilization review complete 

## 2012-07-16 NOTE — Progress Notes (Signed)
I have seen and examined this patient. I have discussed with Dr Wendi Snipes.  I agree with their findings and plans as documented in their progress note. Pt's lethargy improving with stopping Valproate therapy. Will continue off Valproate therapy.  It is not clear that patient has a know seizure disorder.  Await Renal service final recommendations.

## 2012-07-17 LAB — BASIC METABOLIC PANEL
Chloride: 104 mEq/L (ref 96–112)
GFR calc Af Amer: 10 mL/min — ABNORMAL LOW (ref >90)
Potassium: 3.5 mEq/L (ref 3.5–5.1)
Sodium: 140 mEq/L (ref 135–145)

## 2012-07-17 LAB — CBC
Platelets: 277 10*3/uL (ref 150–400)
RDW: 13.7 % (ref 11.5–15.5)
WBC: 6.5 10*3/uL (ref 4.0–10.5)

## 2012-07-17 LAB — IRON AND TIBC: Iron: 96 ug/dL (ref 42–135)

## 2012-07-17 LAB — FERRITIN: Ferritin: 800 ng/mL — ABNORMAL HIGH (ref 10–291)

## 2012-07-17 MED ORDER — RISPERIDONE 1 MG PO TABS
1.0000 mg | ORAL_TABLET | Freq: Two times a day (BID) | ORAL | Status: DC | PRN
  Filled 2012-07-17: qty 1

## 2012-07-17 MED ORDER — RISPERIDONE 1 MG PO TABS
1.0000 mg | ORAL_TABLET | Freq: Every day | ORAL | Status: DC
  Filled 2012-07-17: qty 1

## 2012-07-17 MED ORDER — RISPERIDONE 1 MG PO TABS: 1.0000 mg | ORAL_TABLET | Freq: Every day | ORAL | Status: DC

## 2012-07-17 MED ORDER — CALCITRIOL 0.25 MCG PO CAPS
0.2500 ug | ORAL_CAPSULE | Freq: Every day | ORAL | Status: DC
  Administered 2012-07-17: 0.25 ug via ORAL
  Filled 2012-07-17: qty 1

## 2012-07-17 MED ORDER — RISPERIDONE 1 MG PO TABS: 1.0000 mg | ORAL_TABLET | Freq: Two times a day (BID) | ORAL | Status: DC | PRN

## 2012-07-17 MED ORDER — CALCITRIOL 0.25 MCG PO CAPS: 0.2500 ug | ORAL_CAPSULE | Freq: Every day | ORAL | Status: DC

## 2012-07-17 MED ORDER — FLUCONAZOLE 150 MG PO TABS
150.0000 mg | ORAL_TABLET | Freq: Once | ORAL | Status: AC
  Administered 2012-07-17: 150 mg via ORAL
  Filled 2012-07-17 (×2): qty 1

## 2012-07-17 NOTE — Progress Notes (Signed)
Family Medicine Teaching Service Daily Progress Note Service Page: [phone]  Patient Assessment: 46 y/o female with AMS, renal insufficiency, and hyperammonemia.   Subjective:  No acute events overnight. Sister feels she is speaking more clearly, walking better, and back to baseline. No new complaints.  Objective: Temp:  [97.8 F (36.6 C)-98.5 F (36.9 C)] 97.8 F (36.6 C) (09/25 0600) Pulse Rate:  [72-81] 80  (09/25 0600) Resp:  [18] 18  (09/25 0600) BP: (123-134)/(62-102) 134/62 mmHg (09/25 0600) SpO2:  [95 %-97 %] 96 % (09/25 0600) Weight:  [213 lb 10 oz (96.9 kg)] 213 lb 10 oz (96.9 kg) (09/25 0600) Exam: Gen: NAD, sleeping and reactive to examination of her feet.  HEENT: NCAT  CV: RRR, no murmur, 2+ DP and radial pulses Resp: CTAB, good air movement, non-labored, loud mouth breathing/snoring  Abd: BS present Ext: difficulty examining feet this AM per her withdrawal to touching them.  Neuro: Asleep, moves in response to noxious stimuli ie her foot exam.     I have reviewed the patient's medications, labs, imaging, and diagnostic testing.  Notable results are summarized below.  CBC BMET   Lab 07/17/12 0535 07/16/12 0548 07/15/12 0601  WBC 6.5 7.4 9.2  HGB 10.2* 10.0* 9.7*  HCT 30.8* 30.4* 28.9*  PLT 277 251 225    Lab 07/17/12 0535 07/16/12 0548 07/15/12 0601  NA 140 143 140  K 3.5 4.0 4.0  CL 104 104 102  CO2 19 19 19   BUN 74* 82* 82*  CREATININE 5.49* 6.20* 6.23*  GLUCOSE 120* 87 78  CALCIUM 10.2 10.4 9.9     Imaging/Diagnostic Tests: Valproic acid level Q000111Q  Salicylate 123456  Acetamenophen <15.0   Utox negative   UA cloudy, moderate leukocyte esterase  Ammonia 54  DG Chest 07/14/2012  IMPRESSION: Very low lung volumes with crowding of markings and/or pulmonary vascular congestion. However, no overt pulmonary edema or focal pneumonia identified.  CT Head 07/14/2012  IMPRESSION: Normal noncontrast CT appearance of the brain.  US abdomen complete  07/15/2012 IMPRESSION: Cholelithiasis without sonographic evidence for cholecystitis.  Mild CBD prominence proximally at 7 mm. The distal duct is obscured. Correlate with LFTs and ERCP or MRCP if clinically concerned for an obstructing biliary process.  Heterogeneous liver echogenicity can be seen in the setting of steatosis or hepatitis. No focal abnormality identified. No overt cirrhotic change.  Increased renal parenchymal echogenicity bilaterally suggest underlying medical renal disease. No hydronephrosis.  1.3 cm hypoechoic/nearly anechoic lesion within the right kidney is favored to be a mildly complex cyst.   Plan: Jane [name] is a 46 y.o. year old female with chronic renal insufficiency presenting with altered mental status for 2 days. She has hyperammoniemia that explains her AMS and decreased renal function that partially explains her hyperammonemia. Other DDx for hyperammonemia includes chronic hepatitis, infection, and ingestion.  1. Altered Mental Status/Hepatic encephalopathy: after work up detailed below its likely that depakote is the cause of her current hyperammonemia leading to the AMS. Will continue to try and find out what type of seizures so we can adequately prophylax them.  1. Utox negative, serum salicylates and tylenol negative 2. ammonia 148 improved to 50s  after one dose of lactuulose , albumin 2.8.  3. UA cloudy with Mod leuk esterase, culture negative 4. PT/INR WNL,  5. Hep C Ab, Heb B, HIV- all negative 6. Lactulose 30 g last night, ammonia improved to 54 7. US Abdomen/ renal US no acute findings.   2.  Renal insufficiency: Acute vs. Chronic- baseline Cre is 3.6 from may 2012, 6.5 on admission and 5.49 today.  1. Renal on board - appreciate their reccomendations.  2. Contiinue Gentle hydration: NS at 50 ml/hr per their reccs, avoid NSAIDs, colchicine, and diuretics 3. Sees Dr. Lorrene Reid OP, creatinines baseline 3.6 as above, will call for follow up  OP 4. Plan DC today if renal ok with it.   3. Schizoaffective disorder: Unclear as to its contribution to altered mental status  1. Started risperdal back 1 PO qhs and 1 PO BID PRN 2. Monitor   4. Seizure disorder: No recent seizure activity, family unaware of disorder and depakote started prior to 2009.   1. Depakote level therapeutic, will hold Depakote as its most likely cause of hyperammonemia 2. Per records from [facility] her psychiatrist prescribes her depakote so We will believe it's used for mood stabilization 3. Monitor, plan ativan 2-4 mg for seizures lasting longer than 5 min 4. Discussed possibility of seizure with her sister, red flags and plan to go to ED if they occur.   5. FEN/GI: Renal diet 6. Prophylaxis: SCDs 7. Disposition: Home pending improvement in mental status 8. Code Status: Full for now. Family unsure and will discuss   Kenn File, MD 07/17/2012, 8:53 AM

## 2012-07-17 NOTE — Discharge Summary (Signed)
[facility] Discharge Summary  Patient name: Jane [name] Medical record number: [id number] Date of birth: [birth year] Age: 46 y.o. Gender: female Date of Admission: 07/14/2012  Date of Discharge: 07/17/2012 Admitting Physician: Willeen Niece, MD  Primary Care Provider: Mack Hook, MD  Indication for Hospitalization: Altered mental status, hepatic encephalopathy, Acute kidney injury on Chronic kidney disease Discharge Diagnoses:  1. Valproate-related Hepatic encephalopathy 2. Acute on chronic kidney disease 3. Schizoaffective disorder  Consultations: Nephrology  Significant Labs and Imaging:  Valproic acid level Q000111Q  Salicylate 123456  Acetamenophen <15.0  Utox negative  UA cloudy, moderate leukocyte esterase  Ammonia 148, 54   CMP on admission:   07/14/2012 15:40  Sodium 137  Potassium 4.4  Chloride 97  CO2 18 (L)  BUN 83 (H)  Creatinine 6.54 (H)  Calcium 10.2  GFR calc non Af Amer 7 (L)  GFR calc Af Amer 8 (L)  Glucose 124 (H)  Alkaline Phosphatase 69  Albumin 2.8 (L)  AST 11  ALT <5  Total Protein 7.3  Total Bilirubin 0.4    DG Chest 07/14/2012  IMPRESSION: Very low lung volumes with crowding of markings and/or pulmonary vascular congestion. However, no overt pulmonary edema or focal pneumonia identified.  CT Head 07/14/2012  IMPRESSION: Normal noncontrast CT appearance of the brain.  US abdomen complete 07/15/2012  IMPRESSION: Cholelithiasis without sonographic evidence for cholecystitis.  Mild CBD prominence proximally at 7 mm. The distal duct is obscured. Correlate with LFTs and ERCP or MRCP if clinically concerned for an obstructing biliary process.  Heterogeneous liver echogenicity can be seen in the setting of steatosis or hepatitis. No focal abnormality identified. No overt cirrhotic change.  Increased renal parenchymal echogenicity bilaterally suggest underlying medical renal disease. No  hydronephrosis.  1.3 cm hypoechoic/nearly anechoic lesion within the right kidney is favored to be a mildly complex cyst.   Procedures: None  Brief Hospital Course:  Jane [name] is a 46 y.o. year old female with chronic renal insufficiency that presented to our ED with altered mental status for 2 days. She was found to have hyperammoniemia that explained her AMS and decreased renal function that partially explained her hyperammonemia. Other DDx for hyperammonemia includes chronic hepatitis, infection, and ingestion, and chronic valproic acid use.   1. Altered mental status: In the ED she was evaluated with a  Number of lab tests and imaging tests to understand the etiology of her mental status change. Ingestion was considered and ruled out with a negative urine tox screen, negative salicylate, and acetomenophen levels, and a valproic acid level was found to be in the low-therapeutic range. A CT head and chest xray showed no acute processes. A CMP was obtained largely normal values except for her renal function and albumin. Her ammonia level was found to be 148 which helped explain her altered state.   We looked at her liver with an [place] that showed no acute processes as detailed above. Also Her PT/INR were WNL . After our work up we felt that the valproic acid she was taking was most likely contributing, at least partially, to her hyperammonemia. Marland Kitchen She was treated with 30 g of lactulose and her ammonia level improved to 54 the following morning. Additionally we held most of her meds in the acute setting.  Her mental status improved with ammonia and remianed at baseline throughout the visit.   2. Renal insufficiency: Her baseline creatinine is approx 3.5 in recent months, it was 6.23 on admission.  She was felt to be slightly dry and gently hydrated with 50 ml/hr of IV NS. Nphrology was consulted who suggested to avoid colchicine, NSAIDs, and diuretics.  By admission her creatinine improved very  slightly to 5.49 and arrangements were made for her to followup with her nephrologist outpatient.   3. Schizoaffective disorder: her risperdal and depakote were held throughout admission without event. The day before dsicharge her risperdal was added back at 1 mg qnightly and 1 mg BID PRN for mood.   4. Seizure disorder: It was mentioned in her records that she had a seizure disorder. The family was unaware of this and description of seizure activity could not be found, although it was sought from many different sources. We thought it possible that the depakote could either be used for this or mood stabilization so she was examined off of it  For 2 days for seizure activity. She did not have any seizure activity during admission and the family was advised to seek help from EMS or the ED if she had any.   Discharge Medications:    Medication List     As of 07/17/2012 12:35 PM    STOP taking these medications         colchicine 0.6 MG tablet      divalproex 500 MG 24 hr tablet   Commonly known as: DEPAKOTE ER      TAKE these medications         calcitRIOL 0.25 MCG capsule   Commonly known as: ROCALTROL   Take 1 capsule (0.25 mcg total) by mouth daily.      cetirizine 10 MG tablet   Commonly known as: ZYRTEC   Take 10 mg by mouth daily.      ferrous sulfate 325 (65 FE) MG EC tablet   Take 325 mg by mouth daily with breakfast.      gemfibrozil 600 MG tablet   Commonly known as: LOPID   Take 600 mg by mouth 2 (two) times daily before a meal.      levothyroxine 50 MCG tablet   Commonly known as: SYNTHROID, LEVOTHROID   Take 50 mcg by mouth daily.      metoprolol succinate 100 MG 24 hr tablet   Commonly known as: TOPROL-XL   Take 100 mg by mouth daily. Take with or immediately following a meal.      risperiDONE 1 MG tablet   Commonly known as: RISPERDAL   Take 1 tablet (1 mg total) by mouth at bedtime.      risperiDONE 1 MG tablet   Commonly known as: RISPERDAL   Take 1  tablet (1 mg total) by mouth 2 (two) times daily as needed (agitation).      solifenacin 5 MG tablet   Commonly known as: VESICARE   Take 10 mg by mouth daily.      Vitamin D3 2000 UNITS capsule   Take 2,000 Units by mouth daily.       Issues for Follow Up: Mood seeing as how we decreased her risperdal and discontinued depakote. Consider re-checking an ammonia level to see if it is still WNL. Consider checking kidney function to see if it is returning to baseline.   Outstanding Results: None  Discharge Instructions: Please refer to Patient Instructions section of EMR for full details.  Patient was counseled important signs and symptoms that should prompt return to medical care, changes in medications, dietary instructions, activity restrictions, and follow up appointments.  Significant instructions noted  below:      Follow-up Information    Follow up with GOLDSBOROUGH,KELLIE A, MD. (The scheduled will call you to make appt. )    Contact information:   [address] [place] [address] [phone]       Follow up with [name],GEORGE, MD. On 07/23/2012. (scheduled at 1130)    Contact information:   [address], [id number] [place] [address] [phone]          Discharge Condition: Margret Chance, MD 07/17/2012, 12:35 PM  I discussed Jane [name]'s case with Dr Wendi Snipes.  I agree with his findings and plans as documented above.

## 2012-07-17 NOTE — Progress Notes (Signed)
I discussed with  Dr Bradshaw.  I agree with their plans documented in their progress note for today.  

## 2012-07-17 NOTE — Progress Notes (Signed)
[facility] Progress Note    Subjective:   Patient stable overnight   Objective:   BP 134/62  Pulse 80  Temp 97.8 F (36.6 C) (Oral)  Resp 18  Ht 5\' 4"  (1.626 m)  Wt 213 lb 10 oz (96.9 kg)  BMI 36.67 kg/m2  SpO2 96%  Intake/Output Summary (Last 24 hours) at 07/17/12 1115 Last data filed at 07/17/12 0819  Gross per 24 hour  Intake    970 ml  Output   1500 ml  Net   -530 ml   Weight change: -14.1 oz (-0.4 kg)  Physical Exam: General: VSS. Well-developed, well-nourished, not interactive during the exam, flat affect  Head: Normocephalic, atraumatic., moon facies  Eyes: PERRL, EOMI, mild exophthlamos, No signs of anemia or jaundince.  Mouth: Oropharynx nonerythematous, no exudate appreciated.  Neck: No deformities, masses, or tenderness noted.Supple, No carotid Bruits, no JVD.  Lungs: Normal respiratory effort. Very noisy upper airway breathing at baseline. Clear to auscultation BL without crackles or wheezes.  Heart: RRR. S1 and S2 normal without gallop, murmur, or rubs.  Abdomen: BS normoactive. Soft, Nondistended, non-tender. No masses or organomegaly.  Extremities: No pretibial edema.  Neurologic: Alert, but difficult to assess orientation; not compliant with neuro exam  Skin: No visible rashes, scars.    Imaging: No results found.  Labs: BMET  Lab 07/17/12 0535 07/16/12 0548 07/15/12 0601 07/14/12 1540  NA 140 143 140 137  K 3.5 4.0 4.0 4.4  CL 104 104 102 97  CO2 19 19 19  18*  GLUCOSE 120* 87 78 124*  BUN 74* 82* 82* 83*  CREATININE 5.49* 6.20* 6.23* 6.54*  ALB -- -- -- --  CALCIUM 10.2 10.4 9.9 10.2  PHOS -- -- -- --   CBC  Lab 07/17/12 0535 07/16/12 0548 07/15/12 0601 07/14/12 1540  WBC 6.5 7.4 9.2 9.2  NEUTROABS -- -- -- 6.8  HGB 10.2* 10.0* 9.7* 10.1*  HCT 30.8* 30.4* 28.9* 30.0*  MCV 100.7* 99.7 100.7* 99.7  PLT 277 251 225 238    Medications:       . influenza  inactive virus vaccine  0.5 mL Intramuscular Tomorrow-1000    . levothyroxine  50 mcg Oral QAC breakfast  . loratadine  10 mg Oral Daily  . LORazepam  2 mg Intravenous Once  . metoprolol succinate  100 mg Oral Daily  . pneumococcal 23 valent vaccine  0.5 mL Intramuscular Tomorrow-1000  . risperiDONE  1 mg Oral QHS  . DISCONTD: risperiDONE  3 mg Oral QHS     Assessment/ Plan:   Pt is a 46 y.o. yo female with CKD stage IV, mental retardation, schizoaffective disorder, hypothyroidism, and gout who presented with encephalopathy and acute kidney injury.   Encephalopathy: 2/2 hyperammonemia, resolved   Acute Kidney Injury: Has CKD stage IV with baseline Cr. 3.6, BUN 40 from May 2012. Upon presentation her GFR was < 10, Creat 6.5 and BUN 84. She is also showing signs of uremia. The origin of the injury is possibly secondary to colchicine use. She does seem volume depleted, so it's less likely pre-renal, and the ultrasound has ruled out obstruction. This will hopefully be a reversible injury, as the patient is not a candidate for dialysis secondary to her baseline cognitive functioning. This has been discussed at previous outpatient visits with the patient's sister and father.   - GFR essentially the same with mild improvement in Creat - d/c IVF as little contribution at this point - Hold colchicine  and avoid diuretics, continue renal diet  - No active management from renal perspective that needs hospitalization, appropriate for outpatient f/u when stable from primary team standpoint  Anemia : Baseline Hgb 11.8., currently 9.7. This is likely secondary to CKD. Fe 89, normal Fe sat so no supplementation needed.   Bone Health: Calcium 9.9. PTH 200 - start calcitriol 0.25 mcg daily for secondary hyper PTH, cont as outpatient   We appreciate this interesting consultation.    Marena Chancy Maricela Bo, MD 07/17/2012, 11:15 AM   I have seen and examined this patient and agree with the plan of care  Seen and eval.  Little to offer at this time, Renal function  close to baseline.  Will S/O at this time .  Shahidah Nesbitt L 07/17/2012, 11:46 AM

## 2012-07-19 ENCOUNTER — Other Ambulatory Visit (HOSPITAL_COMMUNITY): Payer: Self-pay | Admitting: Internal Medicine

## 2012-07-19 DIAGNOSIS — Z1231 Encounter for screening mammogram for malignant neoplasm of breast: Secondary | ICD-10-CM

## 2012-08-27 ENCOUNTER — Ambulatory Visit (HOSPITAL_COMMUNITY): Payer: Medicaid Other | Attending: Internal Medicine

## 2012-10-08 ENCOUNTER — Ambulatory Visit (HOSPITAL_COMMUNITY)
Admission: RE | Admit: 2012-10-08 | Discharge: 2012-10-08 | Disposition: A | Discharge status: Home / Self Care | Payer: Medicaid Other | Source: Ambulatory Visit | Attending: Internal Medicine | Admitting: Internal Medicine

## 2012-10-08 DIAGNOSIS — Z1231 Encounter for screening mammogram for malignant neoplasm of breast: Secondary | ICD-10-CM | POA: Insufficient documentation

## 2013-09-08 ENCOUNTER — Other Ambulatory Visit (HOSPITAL_COMMUNITY): Payer: Self-pay | Admitting: Internal Medicine

## 2013-09-08 DIAGNOSIS — Z1231 Encounter for screening mammogram for malignant neoplasm of breast: Secondary | ICD-10-CM

## 2013-10-09 ENCOUNTER — Other Ambulatory Visit (HOSPITAL_COMMUNITY): Payer: Self-pay | Admitting: Internal Medicine

## 2013-10-09 ENCOUNTER — Ambulatory Visit (HOSPITAL_COMMUNITY)
Admission: RE | Admit: 2013-10-09 | Discharge: 2013-10-09 | Disposition: A | Discharge status: Home / Self Care | Payer: Medicaid Other | Source: Ambulatory Visit | Attending: Internal Medicine | Admitting: Internal Medicine

## 2013-10-09 DIAGNOSIS — Z1231 Encounter for screening mammogram for malignant neoplasm of breast: Secondary | ICD-10-CM | POA: Insufficient documentation

## 2013-11-20 ENCOUNTER — Encounter: Payer: Self-pay | Admitting: Obstetrics & Gynecology

## 2013-11-20 ENCOUNTER — Ambulatory Visit (INDEPENDENT_AMBULATORY_CARE_PROVIDER_SITE_OTHER): Payer: Medicaid Other | Admitting: Obstetrics & Gynecology

## 2013-11-20 VITALS — BP 141/92 | HR 74 | Temp 96.5°F | Ht 63.0 in | Wt 169.0 lb

## 2013-11-20 DIAGNOSIS — Z01419 Encounter for gynecological examination (general) (routine) without abnormal findings: Secondary | ICD-10-CM

## 2013-11-20 DIAGNOSIS — Z0000 Encounter for general adult medical examination without abnormal findings: Secondary | ICD-10-CM

## 2013-11-20 DIAGNOSIS — F411 Generalized anxiety disorder: Secondary | ICD-10-CM

## 2013-11-20 DIAGNOSIS — F419 Anxiety disorder, unspecified: Secondary | ICD-10-CM

## 2013-11-20 MED ORDER — DIAZEPAM 10 MG PO TABS
10.0000 mg | ORAL_TABLET | Freq: Four times a day (QID) | ORAL | Status: DC | PRN
Start: 2013-11-20 — End: 2013-11-27

## 2013-11-20 MED ORDER — DIAZEPAM 10 MG PO TABS: 10.0000 mg | ORAL_TABLET | Freq: Four times a day (QID) | ORAL | Status: DC | PRN

## 2013-11-20 NOTE — Progress Notes (Signed)
Patient ID: Jane [name], female   DOB: [birth year], 48 y.o.   MRN: [id number] Subjective:     Jane [name] is a 48 y.o. female here for a routine exam.  Current complaints: Pt has renal disease and her physician requested a PAP in an attempt to prepare the pt for a possible renal transplant.  Pt is mentally disabled and her sister is her historian.  The sister reports that it has been greater than 10years since the pts last PAP and she has not tolerated ofc exams in the past.  She takes Valium for dental procedures   Gynecologic History No LMP recorded. Patient has had an injection. Contraception: abstinence Last Pap: >10years prev. Results were: normal Last mammogram: 09/2013 . Results were: normal  Obstetric History OB History  Gravida Para Term Preterm AB SAB TAB Ectopic Multiple Living  0 0 0 0 0 0 0 0 0 0        Past Medical History  Diagnosis Date  . Renal disease   . Moderately mentally retarded   . Hypertension   . Anemia   . Hyperlipemia    Past Surgical History  Procedure Laterality Date  . Breast reduction surgery    . Foot surgery     Current Outpatient Prescriptions on File Prior to Visit  Medication Sig Dispense Refill  . ferrous sulfate 325 (65 FE) MG EC tablet Take 325 mg by mouth daily with breakfast.      . gemfibrozil (LOPID) 600 MG tablet Take 600 mg by mouth 2 (two) times daily before a meal.      . levothyroxine (SYNTHROID, LEVOTHROID) 50 MCG tablet Take 50 mcg by mouth daily.      . risperiDONE (RISPERDAL) 1 MG tablet Take 1 tablet (1 mg total) by mouth 2 (two) times daily as needed (agitation).  60 tablet  0  . solifenacin (VESICARE) 5 MG tablet Take 10 mg by mouth daily.       No current facility-administered medications on file prior to visit.  No Known Allergies      The following portions of the patient's history were reviewed and updated as appropriate: allergies, current medications, past family history, past medical history, past  social history, past surgical history and problem list.  Review of Systems Pertinent items are noted in HPI.    Objective:    BP 141/92  Pulse 74  Temp(Src) 96.5 F (35.8 C) (Oral)  Ht 5\' 3"  (1.6 m)  Wt 169 lb (76.658 kg)  BMI 29.94 kg/m2  General Appearance:    Alert, cooperative, no distress, appears stated age                 Pt did not tolerate GYN exam at all!!  Will attempt repeat after Valium                                                Assessment:    Healthy female exam.  Unable to complete exam    Plan:  F/u in 4 days Valium 10mg  po x 1 8 hours prior to procedure/PAP   Follow up in: 1 year.

## 2013-11-20 NOTE — Patient Instructions (Signed)

## 2013-11-24 ENCOUNTER — Ambulatory Visit (INDEPENDENT_AMBULATORY_CARE_PROVIDER_SITE_OTHER): Payer: Medicaid Other | Admitting: Obstetrics & Gynecology

## 2013-11-24 VITALS — BP 122/87 | HR 83 | Temp 96.7°F | Ht 64.0 in | Wt 172.2 lb

## 2013-11-24 DIAGNOSIS — Z01419 Encounter for gynecological examination (general) (routine) without abnormal findings: Secondary | ICD-10-CM

## 2013-11-24 NOTE — Patient Instructions (Signed)
Return to clinic for any scheduled appointments or for any gynecologic concerns as needed.   

## 2013-11-24 NOTE — Progress Notes (Signed)
   CLINIC ENCOUNTER NOTE  History:  48 y.o. mentally disabled [id number] here today for pelvic exam and pap smear, unable to tolerate pelvic exam during last visit with Dr. Ihor Dow on 11/20/13. She was given a prescription of Valium to take prior to this exam.    The following portions of the patient's history were reviewed and updated as appropriate: allergies, current medications, past family history, past medical history, past social history, past surgical history and problem list.  Review of Systems:  Pertinent items are noted in HPI.  Objective:  Physical Exam BP 122/87  Pulse 83  Temp(Src) 96.7 F (35.9 C) (Oral)  Ht 5\' 4"  (1.626 m)  Wt 172 lb 3.2 oz (78.109 kg)  BMI 29.54 kg/m2 Gen: Anxious Pelvic: Patient very anxious and agitated.  Allowed for external exam, normal appearing external genitalia noted with very narrow introitus.  Small Pederson (4 cm x 0.7 cm) introduced into vagina; normal appearing vaginal mucosa seen.  Tried to advance towards cervix, patient suddenly screamed, got up, extruded the speculum, jumped off examining table and started to get dressed.  Assessment & Plan:  Failed attempt of pelvic exam Patient needs pelvic and pap done under anesthesia, order sent to surgical scheduler Sister will be called with date and time Dr. Ihor Dow informed Routine preventative health maintenance measures emphasized.   Jane Schneiders, MD, [place] Attending [place], [place]

## 2013-11-27 ENCOUNTER — Encounter (HOSPITAL_COMMUNITY): Payer: Self-pay

## 2013-12-03 ENCOUNTER — Encounter (HOSPITAL_COMMUNITY): Payer: Self-pay

## 2013-12-03 NOTE — Patient Instructions (Addendum)
   Your procedure is scheduled on:  Monday, Feb 16  Enter through the Main Entrance of [facility] at:  9 AM Pick up the phone at the desk and dial [phone] and inform us of your arrival.  Please call this number if you have any problems the morning of surgery: [phone]  Remember: Do not eat food or drink after midnight: Sunday Take these medicines the morning of surgery with a SIP OF WATER:  Blood Pressure medication  Do not wear jewelry, make-up, or FINGER nail polish No metal in your hair or on your body. Do not wear lotions, powders, perfumes.  You may wear deodorant. .  Do not bring valuables to the hospital. Contacts, dentures or bridgework may not be worn into surgery.  Patients discharged on the day of surgery will not be allowed to drive home.  Home with Sister Hilda Blades cell [phone].

## 2013-12-04 ENCOUNTER — Encounter (HOSPITAL_COMMUNITY)
Admission: RE | Admit: 2013-12-04 | Discharge: 2013-12-04 | Disposition: A | Discharge status: Home / Self Care | Payer: Medicaid Other | Source: Ambulatory Visit | Attending: Obstetrics & Gynecology | Admitting: Obstetrics & Gynecology

## 2013-12-04 ENCOUNTER — Encounter (HOSPITAL_COMMUNITY): Payer: Self-pay

## 2013-12-04 DIAGNOSIS — Z01812 Encounter for preprocedural laboratory examination: Secondary | ICD-10-CM | POA: Insufficient documentation

## 2013-12-04 HISTORY — DX: Hypothyroidism, unspecified: E03.9

## 2013-12-04 HISTORY — DX: Bipolar disorder, unspecified: F31.9

## 2013-12-04 HISTORY — DX: Depression, unspecified: F32.A

## 2013-12-04 HISTORY — DX: Unspecified convulsions: R56.9

## 2013-12-04 HISTORY — DX: Major depressive disorder, single episode, unspecified: F32.9

## 2013-12-04 LAB — BASIC METABOLIC PANEL
BUN: 61 mg/dL — AB (ref 6–23)
CALCIUM: 10.7 mg/dL — AB (ref 8.4–10.5)
CHLORIDE: 99 meq/L (ref 96–112)
CO2: 23 mEq/L (ref 19–32)
CREATININE: 3.97 mg/dL — AB (ref 0.50–1.10)
GFR calc non Af Amer: 13 mL/min — ABNORMAL LOW (ref >90)
GFR, EST AFRICAN AMERICAN: 15 mL/min — AB (ref >90)
Glucose, Bld: 92 mg/dL (ref 70–99)
Potassium: 3.8 mEq/L (ref 3.7–5.3)
Sodium: 139 mEq/L (ref 137–147)

## 2013-12-04 LAB — CBC
HEMATOCRIT: 31.2 % — AB (ref 36.0–46.0)
Hemoglobin: 10.3 g/dL — ABNORMAL LOW (ref 12.0–15.0)
MCH: 30.3 pg (ref 26.0–34.0)
MCHC: 33 g/dL (ref 30.0–36.0)
MCV: 91.8 fL (ref 78.0–100.0)
PLATELETS: 405 10*3/uL — AB (ref 150–400)
RBC: 3.4 MIL/uL — ABNORMAL LOW (ref 3.87–5.11)
RDW: 13.7 % (ref 11.5–15.5)
WBC: 6.3 10*3/uL (ref 4.0–10.5)

## 2013-12-04 NOTE — Pre-Procedure Instructions (Signed)
Patient has mental retardation and her legal guardian is her father.  He is not present during today's PAT visit.  Her sister Hilda Blades is here with her.   Patient lives with her sister Hilda Blades and is her main care support person.  Reinforced what Admitting - Pam has told patient and her sister Hilda Blades that their father would have to sign consent form prior to Monday, 12/08/13 9 AM in order for patient to have this procedure.  Hilda Blades stated that their father would be her on day of surgery to sign paperwork/consent form.

## 2013-12-08 ENCOUNTER — Encounter (HOSPITAL_COMMUNITY): Payer: Self-pay | Admitting: Anesthesiology

## 2013-12-08 ENCOUNTER — Ambulatory Visit (HOSPITAL_COMMUNITY): Payer: Medicaid Other | Admitting: Anesthesiology

## 2013-12-08 ENCOUNTER — Encounter (HOSPITAL_COMMUNITY): Payer: Medicaid Other | Admitting: Anesthesiology

## 2013-12-08 ENCOUNTER — Ambulatory Visit (HOSPITAL_COMMUNITY)
Admission: RE | Admit: 2013-12-08 | Discharge: 2013-12-08 | Disposition: A | Discharge status: Home / Self Care | Payer: Medicaid Other | Source: Ambulatory Visit | Attending: Obstetrics & Gynecology | Admitting: Obstetrics & Gynecology

## 2013-12-08 ENCOUNTER — Encounter (HOSPITAL_COMMUNITY)
Admission: RE | Disposition: A | Discharge status: Home / Self Care | Payer: Self-pay | Source: Ambulatory Visit | Attending: Obstetrics & Gynecology

## 2013-12-08 DIAGNOSIS — I10 Essential (primary) hypertension: Secondary | ICD-10-CM | POA: Insufficient documentation

## 2013-12-08 DIAGNOSIS — N289 Disorder of kidney and ureter, unspecified: Secondary | ICD-10-CM | POA: Insufficient documentation

## 2013-12-08 DIAGNOSIS — F71 Moderate intellectual disabilities: Secondary | ICD-10-CM | POA: Insufficient documentation

## 2013-12-08 DIAGNOSIS — D649 Anemia, unspecified: Secondary | ICD-10-CM | POA: Insufficient documentation

## 2013-12-08 DIAGNOSIS — Z124 Encounter for screening for malignant neoplasm of cervix: Secondary | ICD-10-CM

## 2013-12-08 DIAGNOSIS — E785 Hyperlipidemia, unspecified: Secondary | ICD-10-CM | POA: Insufficient documentation

## 2013-12-08 DIAGNOSIS — F319 Bipolar disorder, unspecified: Secondary | ICD-10-CM | POA: Insufficient documentation

## 2013-12-08 DIAGNOSIS — Z01419 Encounter for gynecological examination (general) (routine) without abnormal findings: Secondary | ICD-10-CM

## 2013-12-08 DIAGNOSIS — E039 Hypothyroidism, unspecified: Secondary | ICD-10-CM | POA: Insufficient documentation

## 2013-12-08 DIAGNOSIS — F068 Other specified mental disorders due to known physiological condition: Secondary | ICD-10-CM

## 2013-12-08 DIAGNOSIS — M109 Gout, unspecified: Secondary | ICD-10-CM | POA: Insufficient documentation

## 2013-12-08 HISTORY — PX: EXAMINATION UNDER ANESTHESIA: SHX1540

## 2013-12-08 SURGERY — EXAM UNDER ANESTHESIA
Anesthesia: Monitor Anesthesia Care | Site: Vagina

## 2013-12-08 MED ORDER — PROPOFOL INFUSION 10 MG/ML OPTIME
INTRAVENOUS | Status: DC | PRN
  Administered 2013-12-08: 100 ug/kg/min via INTRAVENOUS

## 2013-12-08 MED ORDER — DEXAMETHASONE SODIUM PHOSPHATE 10 MG/ML IJ SOLN
INTRAMUSCULAR | Status: AC
  Filled 2013-12-08: qty 1

## 2013-12-08 MED ORDER — PROPOFOL 10 MG/ML IV BOLUS
INTRAVENOUS | Status: DC | PRN
  Administered 2013-12-08: 50 mg via INTRAVENOUS
  Administered 2013-12-08: 20 mg via INTRAVENOUS

## 2013-12-08 MED ORDER — FENTANYL CITRATE 0.05 MG/ML IJ SOLN
INTRAMUSCULAR | Status: AC
  Filled 2013-12-08: qty 2

## 2013-12-08 MED ORDER — PROPOFOL 10 MG/ML IV EMUL
INTRAVENOUS | Status: AC
Start: 2013-12-08 — End: 2013-12-08
  Filled 2013-12-08: qty 20

## 2013-12-08 MED ORDER — MIDAZOLAM HCL 2 MG/2ML IJ SOLN
INTRAMUSCULAR | Status: AC
  Filled 2013-12-08: qty 2

## 2013-12-08 MED ORDER — LACTATED RINGERS IV SOLN
INTRAVENOUS | Status: DC
  Administered 2013-12-08: 10:00:00 via INTRAVENOUS

## 2013-12-08 MED ORDER — LACTATED RINGERS IV SOLN: INTRAVENOUS | Status: DC

## 2013-12-08 MED ORDER — LIDOCAINE HCL (CARDIAC) 20 MG/ML IV SOLN
INTRAVENOUS | Status: AC
  Filled 2013-12-08: qty 5

## 2013-12-08 MED ORDER — FENTANYL CITRATE 0.05 MG/ML IJ SOLN: 25.0000 ug | INTRAMUSCULAR | Status: DC | PRN

## 2013-12-08 MED ORDER — ONDANSETRON HCL 4 MG/2ML IJ SOLN
INTRAMUSCULAR | Status: AC
  Filled 2013-12-08: qty 2

## 2013-12-08 MED ORDER — PROPOFOL 10 MG/ML IV EMUL
INTRAVENOUS | Status: AC
  Filled 2013-12-08: qty 40

## 2013-12-08 MED ORDER — PROPOFOL 10 MG/ML IV EMUL
INTRAVENOUS | Status: AC
  Filled 2013-12-08: qty 20

## 2013-12-08 SURGICAL SUPPLY — 12 items
CLOTH BEACON ORANGE TIMEOUT ST (SAFETY) ×2 IMPLANT
GAUZE SPONGE 4X4 16PLY XRAY LF (GAUZE/BANDAGES/DRESSINGS) ×2 IMPLANT
GLOVE BIO SURGEON STRL SZ7 (GLOVE) ×2 IMPLANT
GLOVE BIOGEL PI IND STRL 6.5 (GLOVE) ×1 IMPLANT
GLOVE BIOGEL PI IND STRL 7.0 (GLOVE) ×2 IMPLANT
GLOVE BIOGEL PI INDICATOR 6.5 (GLOVE) ×1
GLOVE BIOGEL PI INDICATOR 7.0 (GLOVE) ×2
GLOVE ECLIPSE 6.0 STRL STRAW (GLOVE) ×2 IMPLANT
GLOVE SURG SS PI 7.0 STRL IVOR (GLOVE) ×2 IMPLANT
GOWN STRL REUS W/ TWL XL LVL3 (GOWN DISPOSABLE) ×1 IMPLANT
GOWN STRL REUS W/TWL LRG LVL3 (GOWN DISPOSABLE) ×2 IMPLANT
GOWN STRL REUS W/TWL XL LVL3 (GOWN DISPOSABLE) ×1

## 2013-12-08 NOTE — OR Nursing (Signed)
Jane [name] pt's caregiver verified OR consent for procedure.

## 2013-12-08 NOTE — Discharge Instructions (Signed)

## 2013-12-08 NOTE — Op Note (Addendum)
12/08/2013  11:35 AM  PATIENT:  Jane [name]  48 y.o. female  PRE-OPERATIVE DIAGNOSIS:  Exam under anesthesia  POST-OPERATIVE DIAGNOSIS:  Exam Under anesthesia PROCEDURE:  Procedure(s) with comments: EXAM UNDER ANESTHESIA (N/A) - Pelvic exam and pap smear, unable to tolerate during last office visit  SURGEON:  Surgeon(s) and Role:    * Lavonia Drafts, MD - Primary  ANESTHESIA:   IV sedation  EBL:  Total I/O In: 500 [I.V.:500] Out: -   BLOOD ADMINISTERED:none  DRAINS: none   LOCAL MEDICATIONS USED:  NONE  SPECIMEN:  Source of Specimen:  endocervix  DISPOSITION OF SPECIMEN:  PATHOLOGY  COUNTS:  YES  TOURNIQUET:  * No tourniquets in log *  DICTATION: .Note written in EPIC  PLAN OF CARE: Discharge to home after PACU  PATIENT DISPOSITION:  PACU - hemodynamically stable.   Delay start of Pharmacological VTE agent (>24hrs) due to surgical blood loss or risk of bleeding: not applicable  Pt taken to exam room where general sedation was performed.    Breast: scars from prev reduction mammoplasty was noted; no masses noted GU: EGBUS: no lesions Vagina: no blood in vault Cervix: no lesion; no mucopurulent d/c Uterus: small, mobile Adnexa: no masses  Pt awakened and taken to recovery in stable condition.  There were no immediate complications.  Pt to be discharged to home  Supreme L. Harraway-Smith, M.D., Cherlynn June

## 2013-12-08 NOTE — Anesthesia Postprocedure Evaluation (Signed)
  Anesthesia Post-op Note  Patient: Jane [name]  Procedure(s) Performed: Procedure(s) with comments: EXAM UNDER ANESTHESIA (N/A) - Pelvic exam and pap smear, unable to tolerate during last office visit  Patient Location: PACU  Anesthesia Type:MAC  Level of Consciousness: awake and alert   Airway and Oxygen Therapy: Patient Spontanous Breathing  Post-op Pain: none  Post-op Assessment: Post-op Vital signs reviewed, Patient's Cardiovascular Status Stable, Respiratory Function Stable, Patent Airway, No signs of Nausea or vomiting and Pain level controlled  Post-op Vital Signs: Reviewed and stable  Complications: No apparent anesthesia complications

## 2013-12-08 NOTE — H&P (Signed)
  Patient ID: Jane [name], female DOB: 1966-04-26, 48 y.o. MRN: [id number]  Subjective:   Jane [name] is a 48 y.o. female here for a routine exam. She was unable to tolerate an exam awake after 2 attempts in the office. 0ne attempt with an anxiolytic. Current complaints: Pt has renal disease and Jane physician requested a PAP in an attempt to prepare the pt for a possible renal transplant. Pt is mentally disabled and Jane [name] is Jane historian. The [name] reports that it has been greater than 10years since the pts last PAP and she has not tolerated ofc exams in the past. She takes Valium for dental procedures  Gynecologic History  No LMP recorded. Patient has had an injection.  Contraception: abstinence  Last Pap: >10years prev. Results were: normal  Last mammogram: 09/2013 . Results were: normal  Obstetric History  OB History   Gravida  Para  Term  Preterm  AB  SAB  TAB  Ectopic  Multiple  Living   0  0  0  0  0  0  0  0  0  0        Past Medical History   Diagnosis  Date   .  Renal disease    .  Moderately mentally retarded    .  Hypertension    .  Anemia    .  Hyperlipemia     Past Surgical History   Procedure  Laterality  Date   .  Breast reduction surgery     .  Foot surgery      Current Outpatient Prescriptions on File Prior to Visit   Medication  Sig  Dispense  Refill   .  ferrous sulfate 325 (65 FE) MG EC tablet  Take 325 mg by mouth daily with breakfast.     .  gemfibrozil (LOPID) 600 MG tablet  Take 600 mg by mouth 2 (two) times daily before a meal.     .  levothyroxine (SYNTHROID, LEVOTHROID) 50 MCG tablet  Take 50 mcg by mouth daily.     .  risperiDONE (RISPERDAL) 1 MG tablet  Take 1 tablet (1 mg total) by mouth 2 (two) times daily as needed (agitation).  60 tablet  0   .  solifenacin (VESICARE) 5 MG tablet  Take 10 mg by mouth daily.      No current facility-administered medications on file prior to visit.   No Known Allergies  The following portions of  the patient's history were reviewed and updated as appropriate: allergies, current medications, past family history, past medical history, past social history, past surgical history and problem list.  Review of Systems  Pertinent items are noted in HPI.  Objective:   BP 132/71  Pulse 81  Temp(Src) 97.9 F (36.6 C) (Oral)  Resp 18  SpO2 100% Exam to be completed under anesthesia Assessment:   Healthy female exam.  Unable to complete exam awake.  For EUA Plan:   F/u PAP

## 2013-12-08 NOTE — Brief Op Note (Addendum)
12/08/2013  11:35 AM  PATIENT:  Jane [name]  48 y.o. female  PRE-OPERATIVE DIAGNOSIS:  Exam under anesthesia  POST-OPERATIVE DIAGNOSIS:  Exam under anesthesia  PROCEDURE:  Procedure(s) with comments: EXAM UNDER ANESTHESIA (N/A) - Pelvic exam and pap smear, unable to tolerate during last office visit  SURGEON:  Surgeon(s) and Role:    * Lavonia Drafts, MD - Primary  ANESTHESIA:   IV sedation  EBL:  Total I/O In: 500 [I.V.:500] Out: -   BLOOD ADMINISTERED:none  DRAINS: none   LOCAL MEDICATIONS USED:  NONE  SPECIMEN:  Source of Specimen:  endocervix  DISPOSITION OF SPECIMEN:  PATHOLOGY  COUNTS:  YES  TOURNIQUET:  * No tourniquets in log *  DICTATION: .Note written in EPIC  PLAN OF CARE: Discharge to home after PACU  PATIENT DISPOSITION:  PACU - hemodynamically stable.   Delay start of Pharmacological VTE agent (>24hrs) due to surgical blood loss or risk of bleeding: not applicable

## 2013-12-08 NOTE — OR Nursing (Signed)
Jane [name] pt's caregiver was present at bedside for entire pre-op assessment.

## 2013-12-08 NOTE — Transfer of Care (Signed)
Immediate Anesthesia Transfer of Care Note  Patient: Jane [name]  Procedure(s) Performed: Procedure(s) with comments: EXAM UNDER ANESTHESIA (N/A) - Pelvic exam and pap smear, unable to tolerate during last office visit  Patient Location: PACU  Anesthesia Type:MAC  Level of Consciousness: awake, alert  and oriented  Airway & Oxygen Therapy: Patient Spontanous Breathing  Post-op Assessment: Report given to PACU RN and Post -op Vital signs reviewed and stable  Post vital signs: Reviewed and stable  Complications: No apparent anesthesia complications

## 2013-12-08 NOTE — Anesthesia Preprocedure Evaluation (Addendum)
Anesthesia Evaluation  Patient identified by MRN, date of birth, ID band Patient awake    Reviewed: Allergy & Precautions, H&P , NPO status , Patient's Chart, lab work & pertinent test results  Airway Mallampati: IV TM Distance: >3 FB Neck ROM: Full    Dental no notable dental hx. (+) Teeth Intact   Pulmonary neg pulmonary ROS,  breath sounds clear to auscultation  Pulmonary exam normal       Cardiovascular hypertension, negative cardio ROS  Rhythm:Regular Rate:Normal     Neuro/Psych Seizures -, Well Controlled,  PSYCHIATRIC DISORDERS Depression Bipolar Disorder Mental Retardationnegative neurological ROS     GI/Hepatic negative GI ROS, Neg liver ROS,   Endo/Other  Hypothyroidism Hyperlipidemia Gout  Renal/GU Renal InsufficiencyRenal disease  negative genitourinary   Musculoskeletal negative musculoskeletal ROS (+)   Abdominal   Peds  Hematology  (+) anemia ,   Anesthesia Other Findings   Reproductive/Obstetrics negative OB ROS                          Anesthesia Physical Anesthesia Plan  ASA: III  Anesthesia Plan: MAC   Post-op Pain Management:    Induction: Intravenous  Airway Management Planned: Simple Face Mask and Natural Airway  Additional Equipment:   Intra-op Plan:   Post-operative Plan:   Informed Consent: I have reviewed the patients History and Physical, chart, labs and discussed the procedure including the risks, benefits and alternatives for the proposed anesthesia with the patient or authorized representative who has indicated his/her understanding and acceptance.     Plan Discussed with: CRNA, Anesthesiologist and Surgeon  Anesthesia Plan Comments: (Will use propofol for sedation. Discussed with Dr. Ihor Dow, procedure will take approximately 5 mins.)       Anesthesia Quick Evaluation

## 2013-12-09 ENCOUNTER — Encounter (HOSPITAL_COMMUNITY): Payer: Self-pay | Admitting: Obstetrics & Gynecology

## 2013-12-24 ENCOUNTER — Encounter: Payer: Medicaid Other | Admitting: Obstetrics & Gynecology

## 2014-01-02 ENCOUNTER — Telehealth: Payer: Self-pay | Admitting: *Deleted

## 2014-01-02 NOTE — Telephone Encounter (Signed)
Call received from pt's sister Benjaman Lobe) requesting results of Pap from last month.  Call returned and Hilda Blades was informed that the Pap was negative. Anique will need her next appt in Feb 2016. She asked if the results could be sent to the [facility].  I stated that a release of information can be completed at the [facility] of the hospital. Hilda Blades voiced understanding.

## 2014-05-26 ENCOUNTER — Encounter: Payer: Self-pay | Admitting: Internal Medicine

## 2014-09-09 ENCOUNTER — Other Ambulatory Visit (HOSPITAL_COMMUNITY): Payer: Self-pay | Admitting: Internal Medicine

## 2014-09-09 DIAGNOSIS — Z1231 Encounter for screening mammogram for malignant neoplasm of breast: Secondary | ICD-10-CM

## 2014-10-13 ENCOUNTER — Ambulatory Visit (HOSPITAL_COMMUNITY): Payer: Medicaid Other

## 2014-10-20 ENCOUNTER — Ambulatory Visit (HOSPITAL_COMMUNITY)
Admission: RE | Admit: 2014-10-20 | Discharge: 2014-10-20 | Disposition: A | Discharge status: Home / Self Care | Payer: Medicaid Other | Source: Ambulatory Visit | Attending: Internal Medicine | Admitting: Internal Medicine

## 2014-10-20 DIAGNOSIS — Z1231 Encounter for screening mammogram for malignant neoplasm of breast: Secondary | ICD-10-CM | POA: Diagnosis not present

## 2014-12-22 DIAGNOSIS — N2581 Secondary hyperparathyroidism of renal origin: Secondary | ICD-10-CM | POA: Diagnosis not present

## 2014-12-22 DIAGNOSIS — D631 Anemia in chronic kidney disease: Secondary | ICD-10-CM | POA: Diagnosis not present

## 2014-12-22 DIAGNOSIS — N186 End stage renal disease: Secondary | ICD-10-CM | POA: Diagnosis not present

## 2014-12-23 DIAGNOSIS — D631 Anemia in chronic kidney disease: Secondary | ICD-10-CM | POA: Diagnosis not present

## 2014-12-23 DIAGNOSIS — N2581 Secondary hyperparathyroidism of renal origin: Secondary | ICD-10-CM | POA: Diagnosis not present

## 2014-12-23 DIAGNOSIS — N186 End stage renal disease: Secondary | ICD-10-CM | POA: Diagnosis not present

## 2014-12-24 DIAGNOSIS — D631 Anemia in chronic kidney disease: Secondary | ICD-10-CM | POA: Diagnosis not present

## 2014-12-24 DIAGNOSIS — N2581 Secondary hyperparathyroidism of renal origin: Secondary | ICD-10-CM | POA: Diagnosis not present

## 2014-12-24 DIAGNOSIS — N186 End stage renal disease: Secondary | ICD-10-CM | POA: Diagnosis not present

## 2014-12-25 DIAGNOSIS — D631 Anemia in chronic kidney disease: Secondary | ICD-10-CM | POA: Diagnosis not present

## 2014-12-25 DIAGNOSIS — N2581 Secondary hyperparathyroidism of renal origin: Secondary | ICD-10-CM | POA: Diagnosis not present

## 2014-12-25 DIAGNOSIS — N186 End stage renal disease: Secondary | ICD-10-CM | POA: Diagnosis not present

## 2014-12-26 DIAGNOSIS — N186 End stage renal disease: Secondary | ICD-10-CM | POA: Diagnosis not present

## 2014-12-26 DIAGNOSIS — D631 Anemia in chronic kidney disease: Secondary | ICD-10-CM | POA: Diagnosis not present

## 2014-12-26 DIAGNOSIS — N2581 Secondary hyperparathyroidism of renal origin: Secondary | ICD-10-CM | POA: Diagnosis not present

## 2014-12-27 DIAGNOSIS — N2581 Secondary hyperparathyroidism of renal origin: Secondary | ICD-10-CM | POA: Diagnosis not present

## 2014-12-27 DIAGNOSIS — N186 End stage renal disease: Secondary | ICD-10-CM | POA: Diagnosis not present

## 2014-12-27 DIAGNOSIS — D631 Anemia in chronic kidney disease: Secondary | ICD-10-CM | POA: Diagnosis not present

## 2014-12-28 DIAGNOSIS — N186 End stage renal disease: Secondary | ICD-10-CM | POA: Diagnosis not present

## 2014-12-28 DIAGNOSIS — D631 Anemia in chronic kidney disease: Secondary | ICD-10-CM | POA: Diagnosis not present

## 2014-12-28 DIAGNOSIS — N2581 Secondary hyperparathyroidism of renal origin: Secondary | ICD-10-CM | POA: Diagnosis not present

## 2014-12-29 DIAGNOSIS — N186 End stage renal disease: Secondary | ICD-10-CM | POA: Diagnosis not present

## 2014-12-29 DIAGNOSIS — D631 Anemia in chronic kidney disease: Secondary | ICD-10-CM | POA: Diagnosis not present

## 2014-12-29 DIAGNOSIS — N2581 Secondary hyperparathyroidism of renal origin: Secondary | ICD-10-CM | POA: Diagnosis not present

## 2014-12-30 DIAGNOSIS — N186 End stage renal disease: Secondary | ICD-10-CM | POA: Diagnosis not present

## 2014-12-30 DIAGNOSIS — D631 Anemia in chronic kidney disease: Secondary | ICD-10-CM | POA: Diagnosis not present

## 2014-12-30 DIAGNOSIS — N2581 Secondary hyperparathyroidism of renal origin: Secondary | ICD-10-CM | POA: Diagnosis not present

## 2014-12-30 DIAGNOSIS — Z309 Encounter for contraceptive management, unspecified: Secondary | ICD-10-CM | POA: Diagnosis not present

## 2014-12-31 DIAGNOSIS — N2581 Secondary hyperparathyroidism of renal origin: Secondary | ICD-10-CM | POA: Diagnosis not present

## 2014-12-31 DIAGNOSIS — N186 End stage renal disease: Secondary | ICD-10-CM | POA: Diagnosis not present

## 2014-12-31 DIAGNOSIS — D631 Anemia in chronic kidney disease: Secondary | ICD-10-CM | POA: Diagnosis not present

## 2015-01-01 DIAGNOSIS — N2581 Secondary hyperparathyroidism of renal origin: Secondary | ICD-10-CM | POA: Diagnosis not present

## 2015-01-01 DIAGNOSIS — D631 Anemia in chronic kidney disease: Secondary | ICD-10-CM | POA: Diagnosis not present

## 2015-01-01 DIAGNOSIS — N186 End stage renal disease: Secondary | ICD-10-CM | POA: Diagnosis not present

## 2015-01-02 DIAGNOSIS — N2581 Secondary hyperparathyroidism of renal origin: Secondary | ICD-10-CM | POA: Diagnosis not present

## 2015-01-02 DIAGNOSIS — N186 End stage renal disease: Secondary | ICD-10-CM | POA: Diagnosis not present

## 2015-01-02 DIAGNOSIS — D631 Anemia in chronic kidney disease: Secondary | ICD-10-CM | POA: Diagnosis not present

## 2015-01-03 DIAGNOSIS — N2581 Secondary hyperparathyroidism of renal origin: Secondary | ICD-10-CM | POA: Diagnosis not present

## 2015-01-03 DIAGNOSIS — N186 End stage renal disease: Secondary | ICD-10-CM | POA: Diagnosis not present

## 2015-01-03 DIAGNOSIS — D631 Anemia in chronic kidney disease: Secondary | ICD-10-CM | POA: Diagnosis not present

## 2015-01-04 DIAGNOSIS — N2581 Secondary hyperparathyroidism of renal origin: Secondary | ICD-10-CM | POA: Diagnosis not present

## 2015-01-04 DIAGNOSIS — D631 Anemia in chronic kidney disease: Secondary | ICD-10-CM | POA: Diagnosis not present

## 2015-01-04 DIAGNOSIS — N186 End stage renal disease: Secondary | ICD-10-CM | POA: Diagnosis not present

## 2015-01-05 ENCOUNTER — Ambulatory Visit: Payer: Medicaid Other | Admitting: Rehabilitative and Restorative Service Providers"

## 2015-01-05 DIAGNOSIS — N2581 Secondary hyperparathyroidism of renal origin: Secondary | ICD-10-CM | POA: Diagnosis not present

## 2015-01-05 DIAGNOSIS — D631 Anemia in chronic kidney disease: Secondary | ICD-10-CM | POA: Diagnosis not present

## 2015-01-05 DIAGNOSIS — N186 End stage renal disease: Secondary | ICD-10-CM | POA: Diagnosis not present

## 2015-01-06 DIAGNOSIS — N186 End stage renal disease: Secondary | ICD-10-CM | POA: Diagnosis not present

## 2015-01-06 DIAGNOSIS — N2581 Secondary hyperparathyroidism of renal origin: Secondary | ICD-10-CM | POA: Diagnosis not present

## 2015-01-06 DIAGNOSIS — D631 Anemia in chronic kidney disease: Secondary | ICD-10-CM | POA: Diagnosis not present

## 2015-01-07 DIAGNOSIS — N186 End stage renal disease: Secondary | ICD-10-CM | POA: Diagnosis not present

## 2015-01-07 DIAGNOSIS — N2581 Secondary hyperparathyroidism of renal origin: Secondary | ICD-10-CM | POA: Diagnosis not present

## 2015-01-07 DIAGNOSIS — D631 Anemia in chronic kidney disease: Secondary | ICD-10-CM | POA: Diagnosis not present

## 2015-01-08 ENCOUNTER — Ambulatory Visit: Payer: Medicaid Other | Attending: Internal Medicine

## 2015-01-08 DIAGNOSIS — F259 Schizoaffective disorder, unspecified: Secondary | ICD-10-CM | POA: Insufficient documentation

## 2015-01-08 DIAGNOSIS — R269 Unspecified abnormalities of gait and mobility: Secondary | ICD-10-CM | POA: Diagnosis present

## 2015-01-08 DIAGNOSIS — N2581 Secondary hyperparathyroidism of renal origin: Secondary | ICD-10-CM | POA: Diagnosis not present

## 2015-01-08 DIAGNOSIS — F79 Unspecified intellectual disabilities: Secondary | ICD-10-CM | POA: Insufficient documentation

## 2015-01-08 DIAGNOSIS — R569 Unspecified convulsions: Secondary | ICD-10-CM | POA: Insufficient documentation

## 2015-01-08 DIAGNOSIS — N186 End stage renal disease: Secondary | ICD-10-CM | POA: Diagnosis not present

## 2015-01-08 DIAGNOSIS — D631 Anemia in chronic kidney disease: Secondary | ICD-10-CM | POA: Diagnosis not present

## 2015-01-08 NOTE — Therapy (Signed)
[facility] [address] [place], [place], [address] Phone: [phone]   Fax:  [phone]  Physical Therapy Evaluation  Patient Details  Name: Jane [name] MRN: [id number] Date of Birth: [birth year] Referring Provider:  [name], MD  Encounter Date: 01/08/2015      PT End of Session - 01/08/15 1442    Visit Number 1   Number of Visits 1   Date for PT Re-Evaluation 01/08/15   Authorization Type Medicaid   PT Start Time 0848   PT Stop Time 0919   PT Time Calculation (min) 31 min   Activity Tolerance Patient tolerated treatment well   Behavior During Therapy Restless      Past Medical History  Diagnosis Date  . Renal disease   . Hypertension   . Anemia   . Hyperlipemia   . Moderately mentally retarded   . Bipolar disorder   . Depression   . Hypothyroidism   . Seizures     No recent seizures - no meds    Past Surgical History  Procedure Laterality Date  . Breast reduction surgery    . Foot surgery      right  . Examination under anesthesia N/A 12/08/2013    Procedure: EXAM UNDER ANESTHESIA;  Surgeon: Lavonia Drafts, MD;  Location: [facility];  Service: Gynecology;  Laterality: N/A;  Pelvic exam and pap smear, unable to tolerate during last office visit    There were no vitals filed for this visit.  Visit Diagnosis:  Abnormality of gait - Plan: PT plan of care cert/re-cert      Subjective Assessment - 01/08/15 0928    Symptoms Pt and pt's sister presenting to PT for eval only, in order to obtain letter of medical necessity for handicap accessible shower and elevated toilet with grab bars.   Pertinent History Mental retardation, schizoaffective disorder, seizures.   Patient Stated Goals To obtain handicap accessible shower and elevated toilet with grab bars.   Currently in Pain? No/denies      Letter of Medical Necessity  Patient Name: Jane [name] Diagnosis: Mental  retardation and seizures Referring Physician: Benito [name] Case Manager/Care Coordinator: Yvonne Kendall; [phone] Date of Birth: 16-Jul-1966 Onset: 1977 Date of Evaluation: 01/08/15  To Whom It May Concern: Jane [name] is a 49 y/o female with mental retardation, schizoaffective disorder, and history of seizures.  Patient also on dialysis for chronic renal insufficiency; and patient has abdominal port. Patient is able to ambulate independently but sister reports she occasionally experiences loss of balance (LOB). Patient lives in [place] home with her sister, Jane [name]. Patient's sister, Jane [name], is present during session and provided history due to patient's cognitive impairments. Jane [name] denied that patient has fallen in the last six months but that patient has limited her activity due to impaired balance. Jane [name] reported that patient requires assistance during bathing and occasionally during toilet transfers in order to maintain balance.  The following revisions are needed with justifications as indicated: Handicap accessible shower: Patient's sister reports patient is able to stand statically for 5-10 minutes without LOB, but is unable perform dynamic standing balance activities without assist in order to maintain balance. Patient requires assistance when standing in the shower to perform hygiene.  Patient currently has a tub shower, which she requires assistance to get into the tub without falling. The tub does not have grab bars, therefore, patient requires hand held assist to maintain standing balance while performing bathing hygiene. Elevated toilet with grab bars: Currently  toilet seat is low, and patient experience LOB when performing stand to sit due to poor eccentric control.  Patient also has difficulty performing sit to stand to due decrease muscle strength, patient requires assistance occasionally to perform sit<>stand transfers due to weakness and impaired balance. Patient  would benefit from an elevated toilet with grab bars installed next to toilet in order to assist patient with sit<>stand transfer on/off toilet without assist, in order to improve patient safety during transfers.   **As I am unable to visit or observe patient in her home environment, all information contained in this letter is based on family members' explanation of home setting.   Please contact me at [phone] with any questions and/or concerns you may have regarding this information.  Thank you for your assistance with this matter.  Sincerely,   Geoffry Paradise, PT, DPT                            PT Short Term Goals - 01/08/15 1444    PT SHORT TERM GOAL #1   Title None-eval only for letter of medical necessity.                  Plan - 01/08/15 1442    Clinical Impression Statement Pt is a 49y/o female presenting to OPPT neuro for a Letter of Medical Necessity to receive a handicap accessible shower, eleveated toilet and grab bars by toilet. Pt's sister, Jane [name] provided history due to pt's cognitive impairments. Please see Letter of Medical Necessity for detail.   PT Frequency One time visit   Consulted and Agree with Plan of Care Patient;Family member/caregiver   Family Member Consulted pt's sister: Jane [name]         Problem List Patient Active Problem List   Diagnosis Date Noted  . Altered mental status 07/15/2012  . Hepatic encephalopathy 07/15/2012  . HYPERTENSION 10/11/2010  . FOOT PAIN, RIGHT 10/11/2010  . BRONCHITIS, ACUTE WITH MILD BRONCHOSPASM 10/04/2010  . ANKLE SPRAIN, RIGHT 02/20/2010  . AGITATION 06/17/2009  . WEIGHT GAIN 06/17/2009  . Dermatophytosis of foot 04/16/2009  . VAGINAL DISCHARGE 04/16/2009  . INSOMNIA 04/16/2009  . POLYDIPSIA 04/16/2009  . FREQUENCY, URINARY 04/16/2009  . ANEMIA, IRON DEFICIENCY 12/03/2007  . SEIZURE DISORDER 09/03/2007  . DYSLIPIDEMIA 06/11/2007  . OBESITY NOS 06/11/2007  . DISORDER,  SCHIZOAFFECTIVE, UNSPC 06/11/2007  . DISORDER, BIPOLAR NOS 06/11/2007  . RETARDATION, MENTAL NOS 06/11/2007  . RENAL INSUFFICIENCY, CHRONIC 06/11/2007  . ACNE NEC 06/11/2007  . URINARY INCONTINENCE, URGE 06/11/2007  . REDUCTION MAMMOPLASTY, HX OF 06/11/2007    Joaovictor Krone L 01/08/2015, 2:45 PM  [facility] [address] [place], [place], [address] Phone: [phone]   Fax:  [phone]    Geoffry Paradise, PT,DPT 01/08/2015 2:45 PM Phone: [phone] Fax: [phone]

## 2015-01-09 DIAGNOSIS — N2581 Secondary hyperparathyroidism of renal origin: Secondary | ICD-10-CM | POA: Diagnosis not present

## 2015-01-09 DIAGNOSIS — N186 End stage renal disease: Secondary | ICD-10-CM | POA: Diagnosis not present

## 2015-01-09 DIAGNOSIS — D631 Anemia in chronic kidney disease: Secondary | ICD-10-CM | POA: Diagnosis not present

## 2015-01-10 DIAGNOSIS — N186 End stage renal disease: Secondary | ICD-10-CM | POA: Diagnosis not present

## 2015-01-10 DIAGNOSIS — N2581 Secondary hyperparathyroidism of renal origin: Secondary | ICD-10-CM | POA: Diagnosis not present

## 2015-01-10 DIAGNOSIS — D631 Anemia in chronic kidney disease: Secondary | ICD-10-CM | POA: Diagnosis not present

## 2015-01-11 DIAGNOSIS — D631 Anemia in chronic kidney disease: Secondary | ICD-10-CM | POA: Diagnosis not present

## 2015-01-11 DIAGNOSIS — N2581 Secondary hyperparathyroidism of renal origin: Secondary | ICD-10-CM | POA: Diagnosis not present

## 2015-01-11 DIAGNOSIS — N186 End stage renal disease: Secondary | ICD-10-CM | POA: Diagnosis not present

## 2015-01-12 DIAGNOSIS — N2581 Secondary hyperparathyroidism of renal origin: Secondary | ICD-10-CM | POA: Diagnosis not present

## 2015-01-12 DIAGNOSIS — D631 Anemia in chronic kidney disease: Secondary | ICD-10-CM | POA: Diagnosis not present

## 2015-01-12 DIAGNOSIS — N186 End stage renal disease: Secondary | ICD-10-CM | POA: Diagnosis not present

## 2015-01-13 DIAGNOSIS — N186 End stage renal disease: Secondary | ICD-10-CM | POA: Diagnosis not present

## 2015-01-13 DIAGNOSIS — N2581 Secondary hyperparathyroidism of renal origin: Secondary | ICD-10-CM | POA: Diagnosis not present

## 2015-01-13 DIAGNOSIS — D631 Anemia in chronic kidney disease: Secondary | ICD-10-CM | POA: Diagnosis not present

## 2015-01-14 DIAGNOSIS — D631 Anemia in chronic kidney disease: Secondary | ICD-10-CM | POA: Diagnosis not present

## 2015-01-14 DIAGNOSIS — N2581 Secondary hyperparathyroidism of renal origin: Secondary | ICD-10-CM | POA: Diagnosis not present

## 2015-01-14 DIAGNOSIS — N186 End stage renal disease: Secondary | ICD-10-CM | POA: Diagnosis not present

## 2015-01-15 DIAGNOSIS — N2581 Secondary hyperparathyroidism of renal origin: Secondary | ICD-10-CM | POA: Diagnosis not present

## 2015-01-15 DIAGNOSIS — N186 End stage renal disease: Secondary | ICD-10-CM | POA: Diagnosis not present

## 2015-01-15 DIAGNOSIS — D631 Anemia in chronic kidney disease: Secondary | ICD-10-CM | POA: Diagnosis not present

## 2015-01-16 DIAGNOSIS — D631 Anemia in chronic kidney disease: Secondary | ICD-10-CM | POA: Diagnosis not present

## 2015-01-16 DIAGNOSIS — N2581 Secondary hyperparathyroidism of renal origin: Secondary | ICD-10-CM | POA: Diagnosis not present

## 2015-01-16 DIAGNOSIS — N186 End stage renal disease: Secondary | ICD-10-CM | POA: Diagnosis not present

## 2015-01-17 DIAGNOSIS — N186 End stage renal disease: Secondary | ICD-10-CM | POA: Diagnosis not present

## 2015-01-17 DIAGNOSIS — D631 Anemia in chronic kidney disease: Secondary | ICD-10-CM | POA: Diagnosis not present

## 2015-01-17 DIAGNOSIS — N2581 Secondary hyperparathyroidism of renal origin: Secondary | ICD-10-CM | POA: Diagnosis not present

## 2015-01-18 DIAGNOSIS — N186 End stage renal disease: Secondary | ICD-10-CM | POA: Diagnosis not present

## 2015-01-18 DIAGNOSIS — D631 Anemia in chronic kidney disease: Secondary | ICD-10-CM | POA: Diagnosis not present

## 2015-01-18 DIAGNOSIS — N2581 Secondary hyperparathyroidism of renal origin: Secondary | ICD-10-CM | POA: Diagnosis not present

## 2015-01-19 DIAGNOSIS — D631 Anemia in chronic kidney disease: Secondary | ICD-10-CM | POA: Diagnosis not present

## 2015-01-19 DIAGNOSIS — N186 End stage renal disease: Secondary | ICD-10-CM | POA: Diagnosis not present

## 2015-01-19 DIAGNOSIS — N2581 Secondary hyperparathyroidism of renal origin: Secondary | ICD-10-CM | POA: Diagnosis not present

## 2015-01-20 DIAGNOSIS — N2581 Secondary hyperparathyroidism of renal origin: Secondary | ICD-10-CM | POA: Diagnosis not present

## 2015-01-20 DIAGNOSIS — D631 Anemia in chronic kidney disease: Secondary | ICD-10-CM | POA: Diagnosis not present

## 2015-01-20 DIAGNOSIS — N186 End stage renal disease: Secondary | ICD-10-CM | POA: Diagnosis not present

## 2015-01-21 DIAGNOSIS — N2581 Secondary hyperparathyroidism of renal origin: Secondary | ICD-10-CM | POA: Diagnosis not present

## 2015-01-21 DIAGNOSIS — N186 End stage renal disease: Secondary | ICD-10-CM | POA: Diagnosis not present

## 2015-01-21 DIAGNOSIS — Z992 Dependence on renal dialysis: Secondary | ICD-10-CM | POA: Diagnosis not present

## 2015-01-21 DIAGNOSIS — D631 Anemia in chronic kidney disease: Secondary | ICD-10-CM | POA: Diagnosis not present

## 2015-01-22 DIAGNOSIS — N186 End stage renal disease: Secondary | ICD-10-CM | POA: Diagnosis not present

## 2015-01-22 DIAGNOSIS — D631 Anemia in chronic kidney disease: Secondary | ICD-10-CM | POA: Diagnosis not present

## 2015-01-22 DIAGNOSIS — N2581 Secondary hyperparathyroidism of renal origin: Secondary | ICD-10-CM | POA: Diagnosis not present

## 2015-01-23 DIAGNOSIS — N2581 Secondary hyperparathyroidism of renal origin: Secondary | ICD-10-CM | POA: Diagnosis not present

## 2015-01-23 DIAGNOSIS — N186 End stage renal disease: Secondary | ICD-10-CM | POA: Diagnosis not present

## 2015-01-23 DIAGNOSIS — D631 Anemia in chronic kidney disease: Secondary | ICD-10-CM | POA: Diagnosis not present

## 2015-01-24 DIAGNOSIS — D631 Anemia in chronic kidney disease: Secondary | ICD-10-CM | POA: Diagnosis not present

## 2015-01-24 DIAGNOSIS — N186 End stage renal disease: Secondary | ICD-10-CM | POA: Diagnosis not present

## 2015-01-24 DIAGNOSIS — N2581 Secondary hyperparathyroidism of renal origin: Secondary | ICD-10-CM | POA: Diagnosis not present

## 2015-01-25 DIAGNOSIS — N2581 Secondary hyperparathyroidism of renal origin: Secondary | ICD-10-CM | POA: Diagnosis not present

## 2015-01-25 DIAGNOSIS — N186 End stage renal disease: Secondary | ICD-10-CM | POA: Diagnosis not present

## 2015-01-25 DIAGNOSIS — D631 Anemia in chronic kidney disease: Secondary | ICD-10-CM | POA: Diagnosis not present

## 2015-01-26 DIAGNOSIS — N186 End stage renal disease: Secondary | ICD-10-CM | POA: Diagnosis not present

## 2015-01-26 DIAGNOSIS — N2581 Secondary hyperparathyroidism of renal origin: Secondary | ICD-10-CM | POA: Diagnosis not present

## 2015-01-26 DIAGNOSIS — D631 Anemia in chronic kidney disease: Secondary | ICD-10-CM | POA: Diagnosis not present

## 2015-01-27 DIAGNOSIS — D631 Anemia in chronic kidney disease: Secondary | ICD-10-CM | POA: Diagnosis not present

## 2015-01-27 DIAGNOSIS — N2581 Secondary hyperparathyroidism of renal origin: Secondary | ICD-10-CM | POA: Diagnosis not present

## 2015-01-27 DIAGNOSIS — N186 End stage renal disease: Secondary | ICD-10-CM | POA: Diagnosis not present

## 2015-01-28 DIAGNOSIS — N2581 Secondary hyperparathyroidism of renal origin: Secondary | ICD-10-CM | POA: Diagnosis not present

## 2015-01-28 DIAGNOSIS — D631 Anemia in chronic kidney disease: Secondary | ICD-10-CM | POA: Diagnosis not present

## 2015-01-28 DIAGNOSIS — N186 End stage renal disease: Secondary | ICD-10-CM | POA: Diagnosis not present

## 2015-01-29 DIAGNOSIS — N2581 Secondary hyperparathyroidism of renal origin: Secondary | ICD-10-CM | POA: Diagnosis not present

## 2015-01-29 DIAGNOSIS — D631 Anemia in chronic kidney disease: Secondary | ICD-10-CM | POA: Diagnosis not present

## 2015-01-29 DIAGNOSIS — N186 End stage renal disease: Secondary | ICD-10-CM | POA: Diagnosis not present

## 2015-01-30 DIAGNOSIS — N2581 Secondary hyperparathyroidism of renal origin: Secondary | ICD-10-CM | POA: Diagnosis not present

## 2015-01-30 DIAGNOSIS — N186 End stage renal disease: Secondary | ICD-10-CM | POA: Diagnosis not present

## 2015-01-30 DIAGNOSIS — D631 Anemia in chronic kidney disease: Secondary | ICD-10-CM | POA: Diagnosis not present

## 2015-01-31 DIAGNOSIS — N186 End stage renal disease: Secondary | ICD-10-CM | POA: Diagnosis not present

## 2015-01-31 DIAGNOSIS — D631 Anemia in chronic kidney disease: Secondary | ICD-10-CM | POA: Diagnosis not present

## 2015-01-31 DIAGNOSIS — N2581 Secondary hyperparathyroidism of renal origin: Secondary | ICD-10-CM | POA: Diagnosis not present

## 2015-02-01 DIAGNOSIS — D631 Anemia in chronic kidney disease: Secondary | ICD-10-CM | POA: Diagnosis not present

## 2015-02-01 DIAGNOSIS — N2581 Secondary hyperparathyroidism of renal origin: Secondary | ICD-10-CM | POA: Diagnosis not present

## 2015-02-01 DIAGNOSIS — N186 End stage renal disease: Secondary | ICD-10-CM | POA: Diagnosis not present

## 2015-02-02 DIAGNOSIS — N186 End stage renal disease: Secondary | ICD-10-CM | POA: Diagnosis not present

## 2015-02-02 DIAGNOSIS — N2581 Secondary hyperparathyroidism of renal origin: Secondary | ICD-10-CM | POA: Diagnosis not present

## 2015-02-02 DIAGNOSIS — D631 Anemia in chronic kidney disease: Secondary | ICD-10-CM | POA: Diagnosis not present

## 2015-02-03 DIAGNOSIS — N2581 Secondary hyperparathyroidism of renal origin: Secondary | ICD-10-CM | POA: Diagnosis not present

## 2015-02-03 DIAGNOSIS — N186 End stage renal disease: Secondary | ICD-10-CM | POA: Diagnosis not present

## 2015-02-03 DIAGNOSIS — D631 Anemia in chronic kidney disease: Secondary | ICD-10-CM | POA: Diagnosis not present

## 2015-02-04 DIAGNOSIS — N186 End stage renal disease: Secondary | ICD-10-CM | POA: Diagnosis not present

## 2015-02-04 DIAGNOSIS — D631 Anemia in chronic kidney disease: Secondary | ICD-10-CM | POA: Diagnosis not present

## 2015-02-04 DIAGNOSIS — N2581 Secondary hyperparathyroidism of renal origin: Secondary | ICD-10-CM | POA: Diagnosis not present

## 2015-02-05 DIAGNOSIS — N186 End stage renal disease: Secondary | ICD-10-CM | POA: Diagnosis not present

## 2015-02-05 DIAGNOSIS — D631 Anemia in chronic kidney disease: Secondary | ICD-10-CM | POA: Diagnosis not present

## 2015-02-05 DIAGNOSIS — N2581 Secondary hyperparathyroidism of renal origin: Secondary | ICD-10-CM | POA: Diagnosis not present

## 2015-02-06 DIAGNOSIS — N186 End stage renal disease: Secondary | ICD-10-CM | POA: Diagnosis not present

## 2015-02-06 DIAGNOSIS — D631 Anemia in chronic kidney disease: Secondary | ICD-10-CM | POA: Diagnosis not present

## 2015-02-06 DIAGNOSIS — N2581 Secondary hyperparathyroidism of renal origin: Secondary | ICD-10-CM | POA: Diagnosis not present

## 2015-02-07 DIAGNOSIS — D631 Anemia in chronic kidney disease: Secondary | ICD-10-CM | POA: Diagnosis not present

## 2015-02-07 DIAGNOSIS — N2581 Secondary hyperparathyroidism of renal origin: Secondary | ICD-10-CM | POA: Diagnosis not present

## 2015-02-07 DIAGNOSIS — N186 End stage renal disease: Secondary | ICD-10-CM | POA: Diagnosis not present

## 2015-02-08 DIAGNOSIS — I10 Essential (primary) hypertension: Secondary | ICD-10-CM | POA: Diagnosis not present

## 2015-02-08 DIAGNOSIS — Z0000 Encounter for general adult medical examination without abnormal findings: Secondary | ICD-10-CM | POA: Diagnosis not present

## 2015-02-08 DIAGNOSIS — N2581 Secondary hyperparathyroidism of renal origin: Secondary | ICD-10-CM | POA: Diagnosis not present

## 2015-02-08 DIAGNOSIS — N186 End stage renal disease: Secondary | ICD-10-CM | POA: Diagnosis not present

## 2015-02-08 DIAGNOSIS — B373 Candidiasis of vulva and vagina: Secondary | ICD-10-CM | POA: Diagnosis not present

## 2015-02-08 DIAGNOSIS — I119 Hypertensive heart disease without heart failure: Secondary | ICD-10-CM | POA: Diagnosis not present

## 2015-02-08 DIAGNOSIS — N3281 Overactive bladder: Secondary | ICD-10-CM | POA: Diagnosis not present

## 2015-02-08 DIAGNOSIS — E785 Hyperlipidemia, unspecified: Secondary | ICD-10-CM | POA: Diagnosis not present

## 2015-02-08 DIAGNOSIS — F319 Bipolar disorder, unspecified: Secondary | ICD-10-CM | POA: Diagnosis not present

## 2015-02-08 DIAGNOSIS — N390 Urinary tract infection, site not specified: Secondary | ICD-10-CM | POA: Diagnosis not present

## 2015-02-08 DIAGNOSIS — D631 Anemia in chronic kidney disease: Secondary | ICD-10-CM | POA: Diagnosis not present

## 2015-02-09 DIAGNOSIS — N186 End stage renal disease: Secondary | ICD-10-CM | POA: Diagnosis not present

## 2015-02-09 DIAGNOSIS — N2581 Secondary hyperparathyroidism of renal origin: Secondary | ICD-10-CM | POA: Diagnosis not present

## 2015-02-09 DIAGNOSIS — D631 Anemia in chronic kidney disease: Secondary | ICD-10-CM | POA: Diagnosis not present

## 2015-02-10 DIAGNOSIS — N186 End stage renal disease: Secondary | ICD-10-CM | POA: Diagnosis not present

## 2015-02-10 DIAGNOSIS — N2581 Secondary hyperparathyroidism of renal origin: Secondary | ICD-10-CM | POA: Diagnosis not present

## 2015-02-10 DIAGNOSIS — D631 Anemia in chronic kidney disease: Secondary | ICD-10-CM | POA: Diagnosis not present

## 2015-02-11 DIAGNOSIS — N186 End stage renal disease: Secondary | ICD-10-CM | POA: Diagnosis not present

## 2015-02-11 DIAGNOSIS — N2581 Secondary hyperparathyroidism of renal origin: Secondary | ICD-10-CM | POA: Diagnosis not present

## 2015-02-11 DIAGNOSIS — D631 Anemia in chronic kidney disease: Secondary | ICD-10-CM | POA: Diagnosis not present

## 2015-02-12 DIAGNOSIS — D631 Anemia in chronic kidney disease: Secondary | ICD-10-CM | POA: Diagnosis not present

## 2015-02-12 DIAGNOSIS — N2581 Secondary hyperparathyroidism of renal origin: Secondary | ICD-10-CM | POA: Diagnosis not present

## 2015-02-12 DIAGNOSIS — N186 End stage renal disease: Secondary | ICD-10-CM | POA: Diagnosis not present

## 2015-02-13 DIAGNOSIS — D631 Anemia in chronic kidney disease: Secondary | ICD-10-CM | POA: Diagnosis not present

## 2015-02-13 DIAGNOSIS — N2581 Secondary hyperparathyroidism of renal origin: Secondary | ICD-10-CM | POA: Diagnosis not present

## 2015-02-13 DIAGNOSIS — N186 End stage renal disease: Secondary | ICD-10-CM | POA: Diagnosis not present

## 2015-02-14 DIAGNOSIS — N186 End stage renal disease: Secondary | ICD-10-CM | POA: Diagnosis not present

## 2015-02-14 DIAGNOSIS — D631 Anemia in chronic kidney disease: Secondary | ICD-10-CM | POA: Diagnosis not present

## 2015-02-14 DIAGNOSIS — N2581 Secondary hyperparathyroidism of renal origin: Secondary | ICD-10-CM | POA: Diagnosis not present

## 2015-02-15 DIAGNOSIS — N186 End stage renal disease: Secondary | ICD-10-CM | POA: Diagnosis not present

## 2015-02-15 DIAGNOSIS — N2581 Secondary hyperparathyroidism of renal origin: Secondary | ICD-10-CM | POA: Diagnosis not present

## 2015-02-15 DIAGNOSIS — D631 Anemia in chronic kidney disease: Secondary | ICD-10-CM | POA: Diagnosis not present

## 2015-02-16 DIAGNOSIS — N2581 Secondary hyperparathyroidism of renal origin: Secondary | ICD-10-CM | POA: Diagnosis not present

## 2015-02-16 DIAGNOSIS — D631 Anemia in chronic kidney disease: Secondary | ICD-10-CM | POA: Diagnosis not present

## 2015-02-16 DIAGNOSIS — N186 End stage renal disease: Secondary | ICD-10-CM | POA: Diagnosis not present

## 2015-02-17 DIAGNOSIS — N2581 Secondary hyperparathyroidism of renal origin: Secondary | ICD-10-CM | POA: Diagnosis not present

## 2015-02-17 DIAGNOSIS — N186 End stage renal disease: Secondary | ICD-10-CM | POA: Diagnosis not present

## 2015-02-17 DIAGNOSIS — D631 Anemia in chronic kidney disease: Secondary | ICD-10-CM | POA: Diagnosis not present

## 2015-02-18 DIAGNOSIS — N186 End stage renal disease: Secondary | ICD-10-CM | POA: Diagnosis not present

## 2015-02-18 DIAGNOSIS — D631 Anemia in chronic kidney disease: Secondary | ICD-10-CM | POA: Diagnosis not present

## 2015-02-18 DIAGNOSIS — N2581 Secondary hyperparathyroidism of renal origin: Secondary | ICD-10-CM | POA: Diagnosis not present

## 2015-02-19 DIAGNOSIS — N186 End stage renal disease: Secondary | ICD-10-CM | POA: Diagnosis not present

## 2015-02-19 DIAGNOSIS — N2581 Secondary hyperparathyroidism of renal origin: Secondary | ICD-10-CM | POA: Diagnosis not present

## 2015-02-19 DIAGNOSIS — D631 Anemia in chronic kidney disease: Secondary | ICD-10-CM | POA: Diagnosis not present

## 2015-02-20 DIAGNOSIS — N186 End stage renal disease: Secondary | ICD-10-CM | POA: Diagnosis not present

## 2015-02-20 DIAGNOSIS — Z992 Dependence on renal dialysis: Secondary | ICD-10-CM | POA: Diagnosis not present

## 2015-02-20 DIAGNOSIS — D631 Anemia in chronic kidney disease: Secondary | ICD-10-CM | POA: Diagnosis not present

## 2015-02-20 DIAGNOSIS — N2581 Secondary hyperparathyroidism of renal origin: Secondary | ICD-10-CM | POA: Diagnosis not present

## 2015-02-21 DIAGNOSIS — D631 Anemia in chronic kidney disease: Secondary | ICD-10-CM | POA: Diagnosis not present

## 2015-02-21 DIAGNOSIS — N2581 Secondary hyperparathyroidism of renal origin: Secondary | ICD-10-CM | POA: Diagnosis not present

## 2015-02-21 DIAGNOSIS — N186 End stage renal disease: Secondary | ICD-10-CM | POA: Diagnosis not present

## 2015-02-22 DIAGNOSIS — N2581 Secondary hyperparathyroidism of renal origin: Secondary | ICD-10-CM | POA: Diagnosis not present

## 2015-02-22 DIAGNOSIS — N186 End stage renal disease: Secondary | ICD-10-CM | POA: Diagnosis not present

## 2015-02-22 DIAGNOSIS — D631 Anemia in chronic kidney disease: Secondary | ICD-10-CM | POA: Diagnosis not present

## 2015-02-23 DIAGNOSIS — N186 End stage renal disease: Secondary | ICD-10-CM | POA: Diagnosis not present

## 2015-02-23 DIAGNOSIS — N2581 Secondary hyperparathyroidism of renal origin: Secondary | ICD-10-CM | POA: Diagnosis not present

## 2015-02-23 DIAGNOSIS — D631 Anemia in chronic kidney disease: Secondary | ICD-10-CM | POA: Diagnosis not present

## 2015-02-24 DIAGNOSIS — N186 End stage renal disease: Secondary | ICD-10-CM | POA: Diagnosis not present

## 2015-02-24 DIAGNOSIS — N2581 Secondary hyperparathyroidism of renal origin: Secondary | ICD-10-CM | POA: Diagnosis not present

## 2015-02-24 DIAGNOSIS — D631 Anemia in chronic kidney disease: Secondary | ICD-10-CM | POA: Diagnosis not present

## 2015-02-25 DIAGNOSIS — N186 End stage renal disease: Secondary | ICD-10-CM | POA: Diagnosis not present

## 2015-02-25 DIAGNOSIS — D631 Anemia in chronic kidney disease: Secondary | ICD-10-CM | POA: Diagnosis not present

## 2015-02-25 DIAGNOSIS — N2581 Secondary hyperparathyroidism of renal origin: Secondary | ICD-10-CM | POA: Diagnosis not present

## 2015-02-26 DIAGNOSIS — N186 End stage renal disease: Secondary | ICD-10-CM | POA: Diagnosis not present

## 2015-02-26 DIAGNOSIS — D631 Anemia in chronic kidney disease: Secondary | ICD-10-CM | POA: Diagnosis not present

## 2015-02-26 DIAGNOSIS — N2581 Secondary hyperparathyroidism of renal origin: Secondary | ICD-10-CM | POA: Diagnosis not present

## 2015-02-27 DIAGNOSIS — D631 Anemia in chronic kidney disease: Secondary | ICD-10-CM | POA: Diagnosis not present

## 2015-02-27 DIAGNOSIS — N2581 Secondary hyperparathyroidism of renal origin: Secondary | ICD-10-CM | POA: Diagnosis not present

## 2015-02-27 DIAGNOSIS — N186 End stage renal disease: Secondary | ICD-10-CM | POA: Diagnosis not present

## 2015-02-28 DIAGNOSIS — N186 End stage renal disease: Secondary | ICD-10-CM | POA: Diagnosis not present

## 2015-02-28 DIAGNOSIS — N2581 Secondary hyperparathyroidism of renal origin: Secondary | ICD-10-CM | POA: Diagnosis not present

## 2015-02-28 DIAGNOSIS — D631 Anemia in chronic kidney disease: Secondary | ICD-10-CM | POA: Diagnosis not present

## 2015-03-01 DIAGNOSIS — D179 Benign lipomatous neoplasm, unspecified: Secondary | ICD-10-CM | POA: Diagnosis not present

## 2015-03-01 DIAGNOSIS — E039 Hypothyroidism, unspecified: Secondary | ICD-10-CM | POA: Diagnosis not present

## 2015-03-01 DIAGNOSIS — E785 Hyperlipidemia, unspecified: Secondary | ICD-10-CM | POA: Diagnosis not present

## 2015-03-01 DIAGNOSIS — I10 Essential (primary) hypertension: Secondary | ICD-10-CM | POA: Diagnosis not present

## 2015-03-01 DIAGNOSIS — N3281 Overactive bladder: Secondary | ICD-10-CM | POA: Diagnosis not present

## 2015-03-01 DIAGNOSIS — I119 Hypertensive heart disease without heart failure: Secondary | ICD-10-CM | POA: Diagnosis not present

## 2015-03-01 DIAGNOSIS — D631 Anemia in chronic kidney disease: Secondary | ICD-10-CM | POA: Diagnosis not present

## 2015-03-01 DIAGNOSIS — N2581 Secondary hyperparathyroidism of renal origin: Secondary | ICD-10-CM | POA: Diagnosis not present

## 2015-03-01 DIAGNOSIS — F319 Bipolar disorder, unspecified: Secondary | ICD-10-CM | POA: Diagnosis not present

## 2015-03-01 DIAGNOSIS — N186 End stage renal disease: Secondary | ICD-10-CM | POA: Diagnosis not present

## 2015-03-02 DIAGNOSIS — D631 Anemia in chronic kidney disease: Secondary | ICD-10-CM | POA: Diagnosis not present

## 2015-03-02 DIAGNOSIS — N2581 Secondary hyperparathyroidism of renal origin: Secondary | ICD-10-CM | POA: Diagnosis not present

## 2015-03-02 DIAGNOSIS — N186 End stage renal disease: Secondary | ICD-10-CM | POA: Diagnosis not present

## 2015-03-03 DIAGNOSIS — N2581 Secondary hyperparathyroidism of renal origin: Secondary | ICD-10-CM | POA: Diagnosis not present

## 2015-03-03 DIAGNOSIS — D631 Anemia in chronic kidney disease: Secondary | ICD-10-CM | POA: Diagnosis not present

## 2015-03-03 DIAGNOSIS — N186 End stage renal disease: Secondary | ICD-10-CM | POA: Diagnosis not present

## 2015-03-04 DIAGNOSIS — N186 End stage renal disease: Secondary | ICD-10-CM | POA: Diagnosis not present

## 2015-03-04 DIAGNOSIS — D631 Anemia in chronic kidney disease: Secondary | ICD-10-CM | POA: Diagnosis not present

## 2015-03-04 DIAGNOSIS — N2581 Secondary hyperparathyroidism of renal origin: Secondary | ICD-10-CM | POA: Diagnosis not present

## 2015-03-05 DIAGNOSIS — N186 End stage renal disease: Secondary | ICD-10-CM | POA: Diagnosis not present

## 2015-03-05 DIAGNOSIS — D631 Anemia in chronic kidney disease: Secondary | ICD-10-CM | POA: Diagnosis not present

## 2015-03-05 DIAGNOSIS — N2581 Secondary hyperparathyroidism of renal origin: Secondary | ICD-10-CM | POA: Diagnosis not present

## 2015-03-06 DIAGNOSIS — N2581 Secondary hyperparathyroidism of renal origin: Secondary | ICD-10-CM | POA: Diagnosis not present

## 2015-03-06 DIAGNOSIS — N186 End stage renal disease: Secondary | ICD-10-CM | POA: Diagnosis not present

## 2015-03-06 DIAGNOSIS — D631 Anemia in chronic kidney disease: Secondary | ICD-10-CM | POA: Diagnosis not present

## 2015-03-07 DIAGNOSIS — D631 Anemia in chronic kidney disease: Secondary | ICD-10-CM | POA: Diagnosis not present

## 2015-03-07 DIAGNOSIS — N186 End stage renal disease: Secondary | ICD-10-CM | POA: Diagnosis not present

## 2015-03-07 DIAGNOSIS — N2581 Secondary hyperparathyroidism of renal origin: Secondary | ICD-10-CM | POA: Diagnosis not present

## 2015-03-08 DIAGNOSIS — D631 Anemia in chronic kidney disease: Secondary | ICD-10-CM | POA: Diagnosis not present

## 2015-03-08 DIAGNOSIS — N186 End stage renal disease: Secondary | ICD-10-CM | POA: Diagnosis not present

## 2015-03-08 DIAGNOSIS — N2581 Secondary hyperparathyroidism of renal origin: Secondary | ICD-10-CM | POA: Diagnosis not present

## 2015-03-09 DIAGNOSIS — N186 End stage renal disease: Secondary | ICD-10-CM | POA: Diagnosis not present

## 2015-03-09 DIAGNOSIS — D631 Anemia in chronic kidney disease: Secondary | ICD-10-CM | POA: Diagnosis not present

## 2015-03-09 DIAGNOSIS — N2581 Secondary hyperparathyroidism of renal origin: Secondary | ICD-10-CM | POA: Diagnosis not present

## 2015-03-10 DIAGNOSIS — D631 Anemia in chronic kidney disease: Secondary | ICD-10-CM | POA: Diagnosis not present

## 2015-03-10 DIAGNOSIS — N186 End stage renal disease: Secondary | ICD-10-CM | POA: Diagnosis not present

## 2015-03-10 DIAGNOSIS — N2581 Secondary hyperparathyroidism of renal origin: Secondary | ICD-10-CM | POA: Diagnosis not present

## 2015-03-11 DIAGNOSIS — D631 Anemia in chronic kidney disease: Secondary | ICD-10-CM | POA: Diagnosis not present

## 2015-03-11 DIAGNOSIS — N186 End stage renal disease: Secondary | ICD-10-CM | POA: Diagnosis not present

## 2015-03-11 DIAGNOSIS — N2581 Secondary hyperparathyroidism of renal origin: Secondary | ICD-10-CM | POA: Diagnosis not present

## 2015-03-12 DIAGNOSIS — N2581 Secondary hyperparathyroidism of renal origin: Secondary | ICD-10-CM | POA: Diagnosis not present

## 2015-03-12 DIAGNOSIS — N186 End stage renal disease: Secondary | ICD-10-CM | POA: Diagnosis not present

## 2015-03-12 DIAGNOSIS — D631 Anemia in chronic kidney disease: Secondary | ICD-10-CM | POA: Diagnosis not present

## 2015-03-13 DIAGNOSIS — N2581 Secondary hyperparathyroidism of renal origin: Secondary | ICD-10-CM | POA: Diagnosis not present

## 2015-03-13 DIAGNOSIS — N186 End stage renal disease: Secondary | ICD-10-CM | POA: Diagnosis not present

## 2015-03-13 DIAGNOSIS — D631 Anemia in chronic kidney disease: Secondary | ICD-10-CM | POA: Diagnosis not present

## 2015-03-14 DIAGNOSIS — N186 End stage renal disease: Secondary | ICD-10-CM | POA: Diagnosis not present

## 2015-03-14 DIAGNOSIS — N2581 Secondary hyperparathyroidism of renal origin: Secondary | ICD-10-CM | POA: Diagnosis not present

## 2015-03-14 DIAGNOSIS — D631 Anemia in chronic kidney disease: Secondary | ICD-10-CM | POA: Diagnosis not present

## 2015-03-15 DIAGNOSIS — N2581 Secondary hyperparathyroidism of renal origin: Secondary | ICD-10-CM | POA: Diagnosis not present

## 2015-03-15 DIAGNOSIS — D631 Anemia in chronic kidney disease: Secondary | ICD-10-CM | POA: Diagnosis not present

## 2015-03-15 DIAGNOSIS — N186 End stage renal disease: Secondary | ICD-10-CM | POA: Diagnosis not present

## 2015-03-16 DIAGNOSIS — D631 Anemia in chronic kidney disease: Secondary | ICD-10-CM | POA: Diagnosis not present

## 2015-03-16 DIAGNOSIS — N186 End stage renal disease: Secondary | ICD-10-CM | POA: Diagnosis not present

## 2015-03-16 DIAGNOSIS — N2581 Secondary hyperparathyroidism of renal origin: Secondary | ICD-10-CM | POA: Diagnosis not present

## 2015-03-17 DIAGNOSIS — D631 Anemia in chronic kidney disease: Secondary | ICD-10-CM | POA: Diagnosis not present

## 2015-03-17 DIAGNOSIS — N2581 Secondary hyperparathyroidism of renal origin: Secondary | ICD-10-CM | POA: Diagnosis not present

## 2015-03-17 DIAGNOSIS — N186 End stage renal disease: Secondary | ICD-10-CM | POA: Diagnosis not present

## 2015-03-18 DIAGNOSIS — N186 End stage renal disease: Secondary | ICD-10-CM | POA: Diagnosis not present

## 2015-03-18 DIAGNOSIS — D631 Anemia in chronic kidney disease: Secondary | ICD-10-CM | POA: Diagnosis not present

## 2015-03-18 DIAGNOSIS — N2581 Secondary hyperparathyroidism of renal origin: Secondary | ICD-10-CM | POA: Diagnosis not present

## 2015-03-19 DIAGNOSIS — N186 End stage renal disease: Secondary | ICD-10-CM | POA: Diagnosis not present

## 2015-03-19 DIAGNOSIS — N2581 Secondary hyperparathyroidism of renal origin: Secondary | ICD-10-CM | POA: Diagnosis not present

## 2015-03-19 DIAGNOSIS — D631 Anemia in chronic kidney disease: Secondary | ICD-10-CM | POA: Diagnosis not present

## 2015-03-20 DIAGNOSIS — N186 End stage renal disease: Secondary | ICD-10-CM | POA: Diagnosis not present

## 2015-03-20 DIAGNOSIS — D631 Anemia in chronic kidney disease: Secondary | ICD-10-CM | POA: Diagnosis not present

## 2015-03-20 DIAGNOSIS — N2581 Secondary hyperparathyroidism of renal origin: Secondary | ICD-10-CM | POA: Diagnosis not present

## 2015-03-21 DIAGNOSIS — N2581 Secondary hyperparathyroidism of renal origin: Secondary | ICD-10-CM | POA: Diagnosis not present

## 2015-03-21 DIAGNOSIS — D631 Anemia in chronic kidney disease: Secondary | ICD-10-CM | POA: Diagnosis not present

## 2015-03-21 DIAGNOSIS — N186 End stage renal disease: Secondary | ICD-10-CM | POA: Diagnosis not present

## 2015-03-22 DIAGNOSIS — N186 End stage renal disease: Secondary | ICD-10-CM | POA: Diagnosis not present

## 2015-03-22 DIAGNOSIS — N2581 Secondary hyperparathyroidism of renal origin: Secondary | ICD-10-CM | POA: Diagnosis not present

## 2015-03-22 DIAGNOSIS — D631 Anemia in chronic kidney disease: Secondary | ICD-10-CM | POA: Diagnosis not present

## 2015-03-23 DIAGNOSIS — D631 Anemia in chronic kidney disease: Secondary | ICD-10-CM | POA: Diagnosis not present

## 2015-03-23 DIAGNOSIS — N186 End stage renal disease: Secondary | ICD-10-CM | POA: Diagnosis not present

## 2015-03-23 DIAGNOSIS — N2581 Secondary hyperparathyroidism of renal origin: Secondary | ICD-10-CM | POA: Diagnosis not present

## 2015-03-23 DIAGNOSIS — Z992 Dependence on renal dialysis: Secondary | ICD-10-CM | POA: Diagnosis not present

## 2015-03-24 DIAGNOSIS — D509 Iron deficiency anemia, unspecified: Secondary | ICD-10-CM | POA: Diagnosis not present

## 2015-03-24 DIAGNOSIS — D631 Anemia in chronic kidney disease: Secondary | ICD-10-CM | POA: Diagnosis not present

## 2015-03-24 DIAGNOSIS — Z23 Encounter for immunization: Secondary | ICD-10-CM | POA: Diagnosis not present

## 2015-03-24 DIAGNOSIS — N2581 Secondary hyperparathyroidism of renal origin: Secondary | ICD-10-CM | POA: Diagnosis not present

## 2015-03-24 DIAGNOSIS — N186 End stage renal disease: Secondary | ICD-10-CM | POA: Diagnosis not present

## 2015-03-25 DIAGNOSIS — D509 Iron deficiency anemia, unspecified: Secondary | ICD-10-CM | POA: Diagnosis not present

## 2015-03-25 DIAGNOSIS — N2581 Secondary hyperparathyroidism of renal origin: Secondary | ICD-10-CM | POA: Diagnosis not present

## 2015-03-25 DIAGNOSIS — N186 End stage renal disease: Secondary | ICD-10-CM | POA: Diagnosis not present

## 2015-03-25 DIAGNOSIS — D631 Anemia in chronic kidney disease: Secondary | ICD-10-CM | POA: Diagnosis not present

## 2015-03-25 DIAGNOSIS — Z23 Encounter for immunization: Secondary | ICD-10-CM | POA: Diagnosis not present

## 2015-03-26 DIAGNOSIS — N186 End stage renal disease: Secondary | ICD-10-CM | POA: Diagnosis not present

## 2015-03-26 DIAGNOSIS — Z23 Encounter for immunization: Secondary | ICD-10-CM | POA: Diagnosis not present

## 2015-03-26 DIAGNOSIS — D631 Anemia in chronic kidney disease: Secondary | ICD-10-CM | POA: Diagnosis not present

## 2015-03-26 DIAGNOSIS — N2581 Secondary hyperparathyroidism of renal origin: Secondary | ICD-10-CM | POA: Diagnosis not present

## 2015-03-26 DIAGNOSIS — D509 Iron deficiency anemia, unspecified: Secondary | ICD-10-CM | POA: Diagnosis not present

## 2015-03-27 DIAGNOSIS — D631 Anemia in chronic kidney disease: Secondary | ICD-10-CM | POA: Diagnosis not present

## 2015-03-27 DIAGNOSIS — N186 End stage renal disease: Secondary | ICD-10-CM | POA: Diagnosis not present

## 2015-03-27 DIAGNOSIS — D509 Iron deficiency anemia, unspecified: Secondary | ICD-10-CM | POA: Diagnosis not present

## 2015-03-27 DIAGNOSIS — N2581 Secondary hyperparathyroidism of renal origin: Secondary | ICD-10-CM | POA: Diagnosis not present

## 2015-03-27 DIAGNOSIS — Z23 Encounter for immunization: Secondary | ICD-10-CM | POA: Diagnosis not present

## 2015-03-28 DIAGNOSIS — N2581 Secondary hyperparathyroidism of renal origin: Secondary | ICD-10-CM | POA: Diagnosis not present

## 2015-03-28 DIAGNOSIS — N186 End stage renal disease: Secondary | ICD-10-CM | POA: Diagnosis not present

## 2015-03-28 DIAGNOSIS — D509 Iron deficiency anemia, unspecified: Secondary | ICD-10-CM | POA: Diagnosis not present

## 2015-03-28 DIAGNOSIS — Z23 Encounter for immunization: Secondary | ICD-10-CM | POA: Diagnosis not present

## 2015-03-28 DIAGNOSIS — D631 Anemia in chronic kidney disease: Secondary | ICD-10-CM | POA: Diagnosis not present

## 2015-03-29 ENCOUNTER — Ambulatory Visit: Payer: Self-pay | Admitting: Surgery

## 2015-03-29 DIAGNOSIS — N2581 Secondary hyperparathyroidism of renal origin: Secondary | ICD-10-CM | POA: Diagnosis not present

## 2015-03-29 DIAGNOSIS — D631 Anemia in chronic kidney disease: Secondary | ICD-10-CM | POA: Diagnosis not present

## 2015-03-29 DIAGNOSIS — D171 Benign lipomatous neoplasm of skin and subcutaneous tissue of trunk: Secondary | ICD-10-CM | POA: Diagnosis not present

## 2015-03-29 DIAGNOSIS — N186 End stage renal disease: Secondary | ICD-10-CM | POA: Diagnosis not present

## 2015-03-29 DIAGNOSIS — D509 Iron deficiency anemia, unspecified: Secondary | ICD-10-CM | POA: Diagnosis not present

## 2015-03-29 DIAGNOSIS — Z23 Encounter for immunization: Secondary | ICD-10-CM | POA: Diagnosis not present

## 2015-03-29 NOTE — H&P (Signed)
Jane [name] 03/29/2015 10:08 AM Location: [facility] Patient #: [id number] DOB: 04-07-1966 Single / Language: Jane [name] / Race: Black or African American Female History of Present Illness Marcello Moores A. Kesley Gaffey MD; 03/29/2015 10:44 AM) Patient words: lipoma on upper back    pT HAS A LARGE LIPOMA UPPER RIGHT BACK AND IS SENT TODAY AT THE REQUEST OF DR Emilee Hero BONSU. IT IS GETTING LARGER. NO DRAINAGE OR PAIN. NO CHANGE IN COLOR. IT HAS BEEN PRESENT FOR MANY MONTHS BUT PATIENT NOT THE BEST HISTORIAN.  The patient is a 49 year old female   Other Problems Elbert Ewings, CMA; 03/29/2015 10:08 AM) Back Pain Chronic Renal Failure Syndrome High blood pressure Sleep Apnea Thyroid Disease  Past Surgical History Elbert Ewings, CMA; 03/29/2015 10:08 AM) Foot Surgery Bilateral. Mammoplasty; Reduction Bilateral.  Diagnostic Studies History Elbert Ewings, CMA; 03/29/2015 10:08 AM) Colonoscopy never Mammogram within last year Pap Smear 1-5 years ago  Allergies Elbert Ewings, CMA; 03/29/2015 10:09 AM) No Known Drug Allergies 03/29/2015  Medication History Elbert Ewings, CMA; 03/29/2015 10:12 AM) Levothyroxine Sodium (50MCG Tablet, Oral) Active. RisperiDONE (3MG  Tablet, Oral) Active. Sensipar (30MG  Tablet, Oral) Active. Allopurinol (300MG  Tablet, Oral) Active. Ferrous Fumarate (325 (106 Fe)MG Tablet, Oral) Active. Gemfibrozil (600MG  Tablet, Oral) Active. VESIcare (5MG  Tablet, Oral) Active. MedroxyPROGESTERone Acetate (150MG /ML Suspension, Intramuscular) Active. Medications Reconciled  Pregnancy / Birth History Elbert Ewings, CMA; 03/29/2015 10:08 AM) Age at menarche 62 years. Contraceptive History Depo-provera. Gravida 0 Irregular periods Para 0     Review of Systems Elbert Ewings CMA; 03/29/2015 10:08 AM) General Not Present- Appetite Loss, Chills, Fatigue, Fever, Night Sweats, Weight Gain and Weight Loss. Skin Not Present- Change in Wart/Mole, Dryness, Hives,  Jaundice, New Lesions, Non-Healing Wounds, Rash and Ulcer. HEENT Present- Seasonal Allergies. Not Present- Earache, Hearing Loss, Hoarseness, Nose Bleed, Oral Ulcers, Ringing in the Ears, Sinus Pain, Sore Throat, Visual Disturbances, Wears glasses/contact lenses and Yellow Eyes. Respiratory Not Present- Bloody sputum, Chronic Cough, Difficulty Breathing, Snoring and Wheezing. Breast Not Present- Breast Mass, Breast Pain, Nipple Discharge and Skin Changes. Cardiovascular Not Present- Chest Pain, Difficulty Breathing Lying Down, Leg Cramps, Palpitations, Rapid Heart Rate, Shortness of Breath and Swelling of Extremities. Gastrointestinal Not Present- Abdominal Pain, Bloating, Bloody Stool, Change in Bowel Habits, Chronic diarrhea, Constipation, Difficulty Swallowing, Excessive gas, Gets full quickly at meals, Hemorrhoids, Indigestion, Nausea, Rectal Pain and Vomiting. Female Genitourinary Not Present- Frequency, Nocturia, Painful Urination, Pelvic Pain and Urgency. Musculoskeletal Present- Back Pain. Not Present- Joint Pain, Joint Stiffness, Muscle Pain, Muscle Weakness and Swelling of Extremities. Neurological Not Present- Decreased Memory, Fainting, Headaches, Numbness, Seizures, Tingling, Tremor, Trouble walking and Weakness. Psychiatric Present- Bipolar. Not Present- Anxiety, Change in Sleep Pattern, Depression, Fearful and Frequent crying. Endocrine Not Present- Cold Intolerance, Excessive Hunger, Hair Changes, Heat Intolerance, Hot flashes and New Diabetes. Hematology Not Present- Easy Bruising, Excessive bleeding, Gland problems, HIV and Persistent Infections.  Vitals Elbert Ewings CMA; 03/29/2015 10:12 AM) 03/29/2015 10:12 AM Weight: 161 lb Height: 65in Body Surface Area: 1.83 m Body Mass Index: 26.79 kg/m Temp.: 97.42F(Oral)  Pulse: 89 (Regular)  Resp.: 17 (Unlabored)  BP: 128/70 (Sitting, Left Arm, Standard)     Physical Exam (Galit Urich A. Kolette Vey MD; 03/29/2015 10:47  AM)  General Mental Status-Alert. General Appearance-Consistent with stated age. Hydration-Well hydrated. Voice-Normal.  Integumentary Note: 8 CM X 6 CM MOBILE FATTY MASS NOT FIXED TO UNDERLYING STRUCTURES.    Head and Neck Head-normocephalic, atraumatic with no lesions or palpable masses. Trachea-midline. Thyroid Gland Characteristics - normal size and  consistency.  Chest and Lung Exam Chest and lung exam reveals -quiet, even and easy respiratory effort with no use of accessory muscles and on auscultation, normal breath sounds, no adventitious sounds and normal vocal resonance. Inspection Chest Wall - Normal. Back - normal.  Cardiovascular Cardiovascular examination reveals -normal heart sounds, regular rate and rhythm with no murmurs and normal pedal pulses bilaterally.  Neurologic Neurologic evaluation reveals -alert and oriented x 3 with no impairment of recent or remote memory. Mental Status-Normal.  Musculoskeletal Normal Exam - Left-Upper Extremity Strength Normal and Lower Extremity Strength Normal. Normal Exam - Right-Upper Extremity Strength Normal and Lower Extremity Strength Normal.    Assessment & Plan (Ezariah Nace A. Faolan Springfield MD; 03/29/2015 10:42 AM)  LIPOMA OF BACK (214.8  D17.1) Impression: 8 CM X 6 CM UPPER RIGHT BACK PT DESIRES EXCISION RISK BENEFITS DISCUSSED WITH PATIENT AND FAMILY RISK OF BLEEDING INFECTION RECURRENCE NERVE OR BLODD VESSEL INJURY WOUND COMPLICATIONS SHE AGREES TO PROCEED  Current Plans Pt Education - Benign Tumors: discussed with patient and provided information. Pt Education - [facility] Wound Care Instructions ([place]): discussed with patient and provided informa

## 2015-03-30 DIAGNOSIS — Z23 Encounter for immunization: Secondary | ICD-10-CM | POA: Diagnosis not present

## 2015-03-30 DIAGNOSIS — D509 Iron deficiency anemia, unspecified: Secondary | ICD-10-CM | POA: Diagnosis not present

## 2015-03-30 DIAGNOSIS — D631 Anemia in chronic kidney disease: Secondary | ICD-10-CM | POA: Diagnosis not present

## 2015-03-30 DIAGNOSIS — N2581 Secondary hyperparathyroidism of renal origin: Secondary | ICD-10-CM | POA: Diagnosis not present

## 2015-03-30 DIAGNOSIS — N186 End stage renal disease: Secondary | ICD-10-CM | POA: Diagnosis not present

## 2015-03-31 DIAGNOSIS — Z23 Encounter for immunization: Secondary | ICD-10-CM | POA: Diagnosis not present

## 2015-03-31 DIAGNOSIS — N186 End stage renal disease: Secondary | ICD-10-CM | POA: Diagnosis not present

## 2015-03-31 DIAGNOSIS — D509 Iron deficiency anemia, unspecified: Secondary | ICD-10-CM | POA: Diagnosis not present

## 2015-03-31 DIAGNOSIS — D631 Anemia in chronic kidney disease: Secondary | ICD-10-CM | POA: Diagnosis not present

## 2015-03-31 DIAGNOSIS — N2581 Secondary hyperparathyroidism of renal origin: Secondary | ICD-10-CM | POA: Diagnosis not present

## 2015-04-01 DIAGNOSIS — N2581 Secondary hyperparathyroidism of renal origin: Secondary | ICD-10-CM | POA: Diagnosis not present

## 2015-04-01 DIAGNOSIS — D631 Anemia in chronic kidney disease: Secondary | ICD-10-CM | POA: Diagnosis not present

## 2015-04-01 DIAGNOSIS — Z23 Encounter for immunization: Secondary | ICD-10-CM | POA: Diagnosis not present

## 2015-04-01 DIAGNOSIS — N186 End stage renal disease: Secondary | ICD-10-CM | POA: Diagnosis not present

## 2015-04-01 DIAGNOSIS — D509 Iron deficiency anemia, unspecified: Secondary | ICD-10-CM | POA: Diagnosis not present

## 2015-04-02 DIAGNOSIS — N2581 Secondary hyperparathyroidism of renal origin: Secondary | ICD-10-CM | POA: Diagnosis not present

## 2015-04-02 DIAGNOSIS — Z23 Encounter for immunization: Secondary | ICD-10-CM | POA: Diagnosis not present

## 2015-04-02 DIAGNOSIS — D509 Iron deficiency anemia, unspecified: Secondary | ICD-10-CM | POA: Diagnosis not present

## 2015-04-02 DIAGNOSIS — D631 Anemia in chronic kidney disease: Secondary | ICD-10-CM | POA: Diagnosis not present

## 2015-04-02 DIAGNOSIS — N186 End stage renal disease: Secondary | ICD-10-CM | POA: Diagnosis not present

## 2015-04-03 DIAGNOSIS — N2581 Secondary hyperparathyroidism of renal origin: Secondary | ICD-10-CM | POA: Diagnosis not present

## 2015-04-03 DIAGNOSIS — D509 Iron deficiency anemia, unspecified: Secondary | ICD-10-CM | POA: Diagnosis not present

## 2015-04-03 DIAGNOSIS — D631 Anemia in chronic kidney disease: Secondary | ICD-10-CM | POA: Diagnosis not present

## 2015-04-03 DIAGNOSIS — Z23 Encounter for immunization: Secondary | ICD-10-CM | POA: Diagnosis not present

## 2015-04-03 DIAGNOSIS — N186 End stage renal disease: Secondary | ICD-10-CM | POA: Diagnosis not present

## 2015-04-04 DIAGNOSIS — D509 Iron deficiency anemia, unspecified: Secondary | ICD-10-CM | POA: Diagnosis not present

## 2015-04-04 DIAGNOSIS — Z23 Encounter for immunization: Secondary | ICD-10-CM | POA: Diagnosis not present

## 2015-04-04 DIAGNOSIS — D631 Anemia in chronic kidney disease: Secondary | ICD-10-CM | POA: Diagnosis not present

## 2015-04-04 DIAGNOSIS — N186 End stage renal disease: Secondary | ICD-10-CM | POA: Diagnosis not present

## 2015-04-04 DIAGNOSIS — N2581 Secondary hyperparathyroidism of renal origin: Secondary | ICD-10-CM | POA: Diagnosis not present

## 2015-04-05 DIAGNOSIS — M109 Gout, unspecified: Secondary | ICD-10-CM | POA: Diagnosis not present

## 2015-04-05 DIAGNOSIS — Z23 Encounter for immunization: Secondary | ICD-10-CM | POA: Diagnosis not present

## 2015-04-05 DIAGNOSIS — D631 Anemia in chronic kidney disease: Secondary | ICD-10-CM | POA: Diagnosis not present

## 2015-04-05 DIAGNOSIS — E039 Hypothyroidism, unspecified: Secondary | ICD-10-CM | POA: Diagnosis not present

## 2015-04-05 DIAGNOSIS — D509 Iron deficiency anemia, unspecified: Secondary | ICD-10-CM | POA: Diagnosis not present

## 2015-04-05 DIAGNOSIS — I119 Hypertensive heart disease without heart failure: Secondary | ICD-10-CM | POA: Diagnosis not present

## 2015-04-05 DIAGNOSIS — E785 Hyperlipidemia, unspecified: Secondary | ICD-10-CM | POA: Diagnosis not present

## 2015-04-05 DIAGNOSIS — D539 Nutritional anemia, unspecified: Secondary | ICD-10-CM | POA: Diagnosis not present

## 2015-04-05 DIAGNOSIS — N3281 Overactive bladder: Secondary | ICD-10-CM | POA: Diagnosis not present

## 2015-04-05 DIAGNOSIS — R319 Hematuria, unspecified: Secondary | ICD-10-CM | POA: Diagnosis not present

## 2015-04-05 DIAGNOSIS — N2581 Secondary hyperparathyroidism of renal origin: Secondary | ICD-10-CM | POA: Diagnosis not present

## 2015-04-05 DIAGNOSIS — N186 End stage renal disease: Secondary | ICD-10-CM | POA: Diagnosis not present

## 2015-04-05 DIAGNOSIS — Z309 Encounter for contraceptive management, unspecified: Secondary | ICD-10-CM | POA: Diagnosis not present

## 2015-04-05 DIAGNOSIS — I10 Essential (primary) hypertension: Secondary | ICD-10-CM | POA: Diagnosis not present

## 2015-04-05 DIAGNOSIS — F319 Bipolar disorder, unspecified: Secondary | ICD-10-CM | POA: Diagnosis not present

## 2015-04-06 DIAGNOSIS — N186 End stage renal disease: Secondary | ICD-10-CM | POA: Diagnosis not present

## 2015-04-06 DIAGNOSIS — Z23 Encounter for immunization: Secondary | ICD-10-CM | POA: Diagnosis not present

## 2015-04-06 DIAGNOSIS — D509 Iron deficiency anemia, unspecified: Secondary | ICD-10-CM | POA: Diagnosis not present

## 2015-04-06 DIAGNOSIS — N2581 Secondary hyperparathyroidism of renal origin: Secondary | ICD-10-CM | POA: Diagnosis not present

## 2015-04-06 DIAGNOSIS — D631 Anemia in chronic kidney disease: Secondary | ICD-10-CM | POA: Diagnosis not present

## 2015-04-07 DIAGNOSIS — D509 Iron deficiency anemia, unspecified: Secondary | ICD-10-CM | POA: Diagnosis not present

## 2015-04-07 DIAGNOSIS — N186 End stage renal disease: Secondary | ICD-10-CM | POA: Diagnosis not present

## 2015-04-07 DIAGNOSIS — Z23 Encounter for immunization: Secondary | ICD-10-CM | POA: Diagnosis not present

## 2015-04-07 DIAGNOSIS — D631 Anemia in chronic kidney disease: Secondary | ICD-10-CM | POA: Diagnosis not present

## 2015-04-07 DIAGNOSIS — N2581 Secondary hyperparathyroidism of renal origin: Secondary | ICD-10-CM | POA: Diagnosis not present

## 2015-04-08 DIAGNOSIS — D509 Iron deficiency anemia, unspecified: Secondary | ICD-10-CM | POA: Diagnosis not present

## 2015-04-08 DIAGNOSIS — N2581 Secondary hyperparathyroidism of renal origin: Secondary | ICD-10-CM | POA: Diagnosis not present

## 2015-04-08 DIAGNOSIS — Z23 Encounter for immunization: Secondary | ICD-10-CM | POA: Diagnosis not present

## 2015-04-08 DIAGNOSIS — D631 Anemia in chronic kidney disease: Secondary | ICD-10-CM | POA: Diagnosis not present

## 2015-04-08 DIAGNOSIS — N186 End stage renal disease: Secondary | ICD-10-CM | POA: Diagnosis not present

## 2015-04-09 DIAGNOSIS — Z23 Encounter for immunization: Secondary | ICD-10-CM | POA: Diagnosis not present

## 2015-04-09 DIAGNOSIS — N2581 Secondary hyperparathyroidism of renal origin: Secondary | ICD-10-CM | POA: Diagnosis not present

## 2015-04-09 DIAGNOSIS — D631 Anemia in chronic kidney disease: Secondary | ICD-10-CM | POA: Diagnosis not present

## 2015-04-09 DIAGNOSIS — N186 End stage renal disease: Secondary | ICD-10-CM | POA: Diagnosis not present

## 2015-04-09 DIAGNOSIS — D509 Iron deficiency anemia, unspecified: Secondary | ICD-10-CM | POA: Diagnosis not present

## 2015-04-10 DIAGNOSIS — D509 Iron deficiency anemia, unspecified: Secondary | ICD-10-CM | POA: Diagnosis not present

## 2015-04-10 DIAGNOSIS — N186 End stage renal disease: Secondary | ICD-10-CM | POA: Diagnosis not present

## 2015-04-10 DIAGNOSIS — N2581 Secondary hyperparathyroidism of renal origin: Secondary | ICD-10-CM | POA: Diagnosis not present

## 2015-04-10 DIAGNOSIS — D631 Anemia in chronic kidney disease: Secondary | ICD-10-CM | POA: Diagnosis not present

## 2015-04-10 DIAGNOSIS — Z23 Encounter for immunization: Secondary | ICD-10-CM | POA: Diagnosis not present

## 2015-04-11 DIAGNOSIS — Z23 Encounter for immunization: Secondary | ICD-10-CM | POA: Diagnosis not present

## 2015-04-11 DIAGNOSIS — D631 Anemia in chronic kidney disease: Secondary | ICD-10-CM | POA: Diagnosis not present

## 2015-04-11 DIAGNOSIS — D509 Iron deficiency anemia, unspecified: Secondary | ICD-10-CM | POA: Diagnosis not present

## 2015-04-11 DIAGNOSIS — N186 End stage renal disease: Secondary | ICD-10-CM | POA: Diagnosis not present

## 2015-04-11 DIAGNOSIS — N2581 Secondary hyperparathyroidism of renal origin: Secondary | ICD-10-CM | POA: Diagnosis not present

## 2015-04-12 DIAGNOSIS — D509 Iron deficiency anemia, unspecified: Secondary | ICD-10-CM | POA: Diagnosis not present

## 2015-04-12 DIAGNOSIS — Z23 Encounter for immunization: Secondary | ICD-10-CM | POA: Diagnosis not present

## 2015-04-12 DIAGNOSIS — D631 Anemia in chronic kidney disease: Secondary | ICD-10-CM | POA: Diagnosis not present

## 2015-04-12 DIAGNOSIS — N2581 Secondary hyperparathyroidism of renal origin: Secondary | ICD-10-CM | POA: Diagnosis not present

## 2015-04-12 DIAGNOSIS — N186 End stage renal disease: Secondary | ICD-10-CM | POA: Diagnosis not present

## 2015-04-13 DIAGNOSIS — N2581 Secondary hyperparathyroidism of renal origin: Secondary | ICD-10-CM | POA: Diagnosis not present

## 2015-04-13 DIAGNOSIS — N186 End stage renal disease: Secondary | ICD-10-CM | POA: Diagnosis not present

## 2015-04-13 DIAGNOSIS — Z23 Encounter for immunization: Secondary | ICD-10-CM | POA: Diagnosis not present

## 2015-04-13 DIAGNOSIS — D509 Iron deficiency anemia, unspecified: Secondary | ICD-10-CM | POA: Diagnosis not present

## 2015-04-13 DIAGNOSIS — D631 Anemia in chronic kidney disease: Secondary | ICD-10-CM | POA: Diagnosis not present

## 2015-04-14 DIAGNOSIS — Z23 Encounter for immunization: Secondary | ICD-10-CM | POA: Diagnosis not present

## 2015-04-14 DIAGNOSIS — N186 End stage renal disease: Secondary | ICD-10-CM | POA: Diagnosis not present

## 2015-04-14 DIAGNOSIS — D509 Iron deficiency anemia, unspecified: Secondary | ICD-10-CM | POA: Diagnosis not present

## 2015-04-14 DIAGNOSIS — D631 Anemia in chronic kidney disease: Secondary | ICD-10-CM | POA: Diagnosis not present

## 2015-04-14 DIAGNOSIS — N2581 Secondary hyperparathyroidism of renal origin: Secondary | ICD-10-CM | POA: Diagnosis not present

## 2015-04-15 DIAGNOSIS — Z23 Encounter for immunization: Secondary | ICD-10-CM | POA: Diagnosis not present

## 2015-04-15 DIAGNOSIS — N2581 Secondary hyperparathyroidism of renal origin: Secondary | ICD-10-CM | POA: Diagnosis not present

## 2015-04-15 DIAGNOSIS — D631 Anemia in chronic kidney disease: Secondary | ICD-10-CM | POA: Diagnosis not present

## 2015-04-15 DIAGNOSIS — N186 End stage renal disease: Secondary | ICD-10-CM | POA: Diagnosis not present

## 2015-04-15 DIAGNOSIS — D509 Iron deficiency anemia, unspecified: Secondary | ICD-10-CM | POA: Diagnosis not present

## 2015-04-16 DIAGNOSIS — Z23 Encounter for immunization: Secondary | ICD-10-CM | POA: Diagnosis not present

## 2015-04-16 DIAGNOSIS — D631 Anemia in chronic kidney disease: Secondary | ICD-10-CM | POA: Diagnosis not present

## 2015-04-16 DIAGNOSIS — D509 Iron deficiency anemia, unspecified: Secondary | ICD-10-CM | POA: Diagnosis not present

## 2015-04-16 DIAGNOSIS — N186 End stage renal disease: Secondary | ICD-10-CM | POA: Diagnosis not present

## 2015-04-16 DIAGNOSIS — N2581 Secondary hyperparathyroidism of renal origin: Secondary | ICD-10-CM | POA: Diagnosis not present

## 2015-04-17 DIAGNOSIS — D631 Anemia in chronic kidney disease: Secondary | ICD-10-CM | POA: Diagnosis not present

## 2015-04-17 DIAGNOSIS — D509 Iron deficiency anemia, unspecified: Secondary | ICD-10-CM | POA: Diagnosis not present

## 2015-04-17 DIAGNOSIS — Z23 Encounter for immunization: Secondary | ICD-10-CM | POA: Diagnosis not present

## 2015-04-17 DIAGNOSIS — N2581 Secondary hyperparathyroidism of renal origin: Secondary | ICD-10-CM | POA: Diagnosis not present

## 2015-04-17 DIAGNOSIS — N186 End stage renal disease: Secondary | ICD-10-CM | POA: Diagnosis not present

## 2015-04-18 DIAGNOSIS — N2581 Secondary hyperparathyroidism of renal origin: Secondary | ICD-10-CM | POA: Diagnosis not present

## 2015-04-18 DIAGNOSIS — Z23 Encounter for immunization: Secondary | ICD-10-CM | POA: Diagnosis not present

## 2015-04-18 DIAGNOSIS — D631 Anemia in chronic kidney disease: Secondary | ICD-10-CM | POA: Diagnosis not present

## 2015-04-18 DIAGNOSIS — D509 Iron deficiency anemia, unspecified: Secondary | ICD-10-CM | POA: Diagnosis not present

## 2015-04-18 DIAGNOSIS — N186 End stage renal disease: Secondary | ICD-10-CM | POA: Diagnosis not present

## 2015-04-19 DIAGNOSIS — N186 End stage renal disease: Secondary | ICD-10-CM | POA: Diagnosis not present

## 2015-04-19 DIAGNOSIS — Z23 Encounter for immunization: Secondary | ICD-10-CM | POA: Diagnosis not present

## 2015-04-19 DIAGNOSIS — N2581 Secondary hyperparathyroidism of renal origin: Secondary | ICD-10-CM | POA: Diagnosis not present

## 2015-04-19 DIAGNOSIS — D631 Anemia in chronic kidney disease: Secondary | ICD-10-CM | POA: Diagnosis not present

## 2015-04-19 DIAGNOSIS — D509 Iron deficiency anemia, unspecified: Secondary | ICD-10-CM | POA: Diagnosis not present

## 2015-04-20 DIAGNOSIS — N2581 Secondary hyperparathyroidism of renal origin: Secondary | ICD-10-CM | POA: Diagnosis not present

## 2015-04-20 DIAGNOSIS — D631 Anemia in chronic kidney disease: Secondary | ICD-10-CM | POA: Diagnosis not present

## 2015-04-20 DIAGNOSIS — N186 End stage renal disease: Secondary | ICD-10-CM | POA: Diagnosis not present

## 2015-04-20 DIAGNOSIS — D509 Iron deficiency anemia, unspecified: Secondary | ICD-10-CM | POA: Diagnosis not present

## 2015-04-20 DIAGNOSIS — Z23 Encounter for immunization: Secondary | ICD-10-CM | POA: Diagnosis not present

## 2015-04-21 DIAGNOSIS — D631 Anemia in chronic kidney disease: Secondary | ICD-10-CM | POA: Diagnosis not present

## 2015-04-21 DIAGNOSIS — D509 Iron deficiency anemia, unspecified: Secondary | ICD-10-CM | POA: Diagnosis not present

## 2015-04-21 DIAGNOSIS — Z23 Encounter for immunization: Secondary | ICD-10-CM | POA: Diagnosis not present

## 2015-04-21 DIAGNOSIS — N186 End stage renal disease: Secondary | ICD-10-CM | POA: Diagnosis not present

## 2015-04-21 DIAGNOSIS — N2581 Secondary hyperparathyroidism of renal origin: Secondary | ICD-10-CM | POA: Diagnosis not present

## 2015-04-22 DIAGNOSIS — D631 Anemia in chronic kidney disease: Secondary | ICD-10-CM | POA: Diagnosis not present

## 2015-04-22 DIAGNOSIS — N2581 Secondary hyperparathyroidism of renal origin: Secondary | ICD-10-CM | POA: Diagnosis not present

## 2015-04-22 DIAGNOSIS — Z992 Dependence on renal dialysis: Secondary | ICD-10-CM | POA: Diagnosis not present

## 2015-04-22 DIAGNOSIS — D509 Iron deficiency anemia, unspecified: Secondary | ICD-10-CM | POA: Diagnosis not present

## 2015-04-22 DIAGNOSIS — Z23 Encounter for immunization: Secondary | ICD-10-CM | POA: Diagnosis not present

## 2015-04-22 DIAGNOSIS — N186 End stage renal disease: Secondary | ICD-10-CM | POA: Diagnosis not present

## 2015-04-23 DIAGNOSIS — Z23 Encounter for immunization: Secondary | ICD-10-CM | POA: Diagnosis not present

## 2015-04-23 DIAGNOSIS — N186 End stage renal disease: Secondary | ICD-10-CM | POA: Diagnosis not present

## 2015-04-23 DIAGNOSIS — D631 Anemia in chronic kidney disease: Secondary | ICD-10-CM | POA: Diagnosis not present

## 2015-04-23 DIAGNOSIS — N2581 Secondary hyperparathyroidism of renal origin: Secondary | ICD-10-CM | POA: Diagnosis not present

## 2015-04-24 DIAGNOSIS — Z23 Encounter for immunization: Secondary | ICD-10-CM | POA: Diagnosis not present

## 2015-04-24 DIAGNOSIS — N186 End stage renal disease: Secondary | ICD-10-CM | POA: Diagnosis not present

## 2015-04-24 DIAGNOSIS — N2581 Secondary hyperparathyroidism of renal origin: Secondary | ICD-10-CM | POA: Diagnosis not present

## 2015-04-24 DIAGNOSIS — D631 Anemia in chronic kidney disease: Secondary | ICD-10-CM | POA: Diagnosis not present

## 2015-04-25 DIAGNOSIS — N186 End stage renal disease: Secondary | ICD-10-CM | POA: Diagnosis not present

## 2015-04-25 DIAGNOSIS — D631 Anemia in chronic kidney disease: Secondary | ICD-10-CM | POA: Diagnosis not present

## 2015-04-25 DIAGNOSIS — N2581 Secondary hyperparathyroidism of renal origin: Secondary | ICD-10-CM | POA: Diagnosis not present

## 2015-04-25 DIAGNOSIS — Z23 Encounter for immunization: Secondary | ICD-10-CM | POA: Diagnosis not present

## 2015-04-26 DIAGNOSIS — Z23 Encounter for immunization: Secondary | ICD-10-CM | POA: Diagnosis not present

## 2015-04-26 DIAGNOSIS — N2581 Secondary hyperparathyroidism of renal origin: Secondary | ICD-10-CM | POA: Diagnosis not present

## 2015-04-26 DIAGNOSIS — N186 End stage renal disease: Secondary | ICD-10-CM | POA: Diagnosis not present

## 2015-04-26 DIAGNOSIS — D631 Anemia in chronic kidney disease: Secondary | ICD-10-CM | POA: Diagnosis not present

## 2015-04-27 DIAGNOSIS — N2581 Secondary hyperparathyroidism of renal origin: Secondary | ICD-10-CM | POA: Diagnosis not present

## 2015-04-27 DIAGNOSIS — N186 End stage renal disease: Secondary | ICD-10-CM | POA: Diagnosis not present

## 2015-04-27 DIAGNOSIS — Z23 Encounter for immunization: Secondary | ICD-10-CM | POA: Diagnosis not present

## 2015-04-27 DIAGNOSIS — D631 Anemia in chronic kidney disease: Secondary | ICD-10-CM | POA: Diagnosis not present

## 2015-04-28 DIAGNOSIS — N2581 Secondary hyperparathyroidism of renal origin: Secondary | ICD-10-CM | POA: Diagnosis not present

## 2015-04-28 DIAGNOSIS — D631 Anemia in chronic kidney disease: Secondary | ICD-10-CM | POA: Diagnosis not present

## 2015-04-28 DIAGNOSIS — Z23 Encounter for immunization: Secondary | ICD-10-CM | POA: Diagnosis not present

## 2015-04-28 DIAGNOSIS — N186 End stage renal disease: Secondary | ICD-10-CM | POA: Diagnosis not present

## 2015-04-29 DIAGNOSIS — D631 Anemia in chronic kidney disease: Secondary | ICD-10-CM | POA: Diagnosis not present

## 2015-04-29 DIAGNOSIS — N2581 Secondary hyperparathyroidism of renal origin: Secondary | ICD-10-CM | POA: Diagnosis not present

## 2015-04-29 DIAGNOSIS — Z23 Encounter for immunization: Secondary | ICD-10-CM | POA: Diagnosis not present

## 2015-04-29 DIAGNOSIS — N186 End stage renal disease: Secondary | ICD-10-CM | POA: Diagnosis not present

## 2015-04-30 DIAGNOSIS — D631 Anemia in chronic kidney disease: Secondary | ICD-10-CM | POA: Diagnosis not present

## 2015-04-30 DIAGNOSIS — N2581 Secondary hyperparathyroidism of renal origin: Secondary | ICD-10-CM | POA: Diagnosis not present

## 2015-04-30 DIAGNOSIS — Z23 Encounter for immunization: Secondary | ICD-10-CM | POA: Diagnosis not present

## 2015-04-30 DIAGNOSIS — N186 End stage renal disease: Secondary | ICD-10-CM | POA: Diagnosis not present

## 2015-05-01 DIAGNOSIS — Z23 Encounter for immunization: Secondary | ICD-10-CM | POA: Diagnosis not present

## 2015-05-01 DIAGNOSIS — N186 End stage renal disease: Secondary | ICD-10-CM | POA: Diagnosis not present

## 2015-05-01 DIAGNOSIS — D631 Anemia in chronic kidney disease: Secondary | ICD-10-CM | POA: Diagnosis not present

## 2015-05-01 DIAGNOSIS — N2581 Secondary hyperparathyroidism of renal origin: Secondary | ICD-10-CM | POA: Diagnosis not present

## 2015-05-02 DIAGNOSIS — N186 End stage renal disease: Secondary | ICD-10-CM | POA: Diagnosis not present

## 2015-05-02 DIAGNOSIS — N2581 Secondary hyperparathyroidism of renal origin: Secondary | ICD-10-CM | POA: Diagnosis not present

## 2015-05-02 DIAGNOSIS — Z23 Encounter for immunization: Secondary | ICD-10-CM | POA: Diagnosis not present

## 2015-05-02 DIAGNOSIS — D631 Anemia in chronic kidney disease: Secondary | ICD-10-CM | POA: Diagnosis not present

## 2015-05-03 DIAGNOSIS — N186 End stage renal disease: Secondary | ICD-10-CM | POA: Diagnosis not present

## 2015-05-03 DIAGNOSIS — Z23 Encounter for immunization: Secondary | ICD-10-CM | POA: Diagnosis not present

## 2015-05-03 DIAGNOSIS — N2581 Secondary hyperparathyroidism of renal origin: Secondary | ICD-10-CM | POA: Diagnosis not present

## 2015-05-03 DIAGNOSIS — D631 Anemia in chronic kidney disease: Secondary | ICD-10-CM | POA: Diagnosis not present

## 2015-05-04 DIAGNOSIS — N2581 Secondary hyperparathyroidism of renal origin: Secondary | ICD-10-CM | POA: Diagnosis not present

## 2015-05-04 DIAGNOSIS — D631 Anemia in chronic kidney disease: Secondary | ICD-10-CM | POA: Diagnosis not present

## 2015-05-04 DIAGNOSIS — N186 End stage renal disease: Secondary | ICD-10-CM | POA: Diagnosis not present

## 2015-05-04 DIAGNOSIS — Z23 Encounter for immunization: Secondary | ICD-10-CM | POA: Diagnosis not present

## 2015-05-05 DIAGNOSIS — N186 End stage renal disease: Secondary | ICD-10-CM | POA: Diagnosis not present

## 2015-05-05 DIAGNOSIS — N2581 Secondary hyperparathyroidism of renal origin: Secondary | ICD-10-CM | POA: Diagnosis not present

## 2015-05-05 DIAGNOSIS — Z23 Encounter for immunization: Secondary | ICD-10-CM | POA: Diagnosis not present

## 2015-05-05 DIAGNOSIS — D631 Anemia in chronic kidney disease: Secondary | ICD-10-CM | POA: Diagnosis not present

## 2015-05-06 DIAGNOSIS — N186 End stage renal disease: Secondary | ICD-10-CM | POA: Diagnosis not present

## 2015-05-06 DIAGNOSIS — N2581 Secondary hyperparathyroidism of renal origin: Secondary | ICD-10-CM | POA: Diagnosis not present

## 2015-05-06 DIAGNOSIS — D631 Anemia in chronic kidney disease: Secondary | ICD-10-CM | POA: Diagnosis not present

## 2015-05-06 DIAGNOSIS — Z23 Encounter for immunization: Secondary | ICD-10-CM | POA: Diagnosis not present

## 2015-05-07 ENCOUNTER — Encounter (HOSPITAL_BASED_OUTPATIENT_CLINIC_OR_DEPARTMENT_OTHER): Payer: Self-pay | Admitting: *Deleted

## 2015-05-07 DIAGNOSIS — D631 Anemia in chronic kidney disease: Secondary | ICD-10-CM | POA: Diagnosis not present

## 2015-05-07 DIAGNOSIS — N186 End stage renal disease: Secondary | ICD-10-CM | POA: Diagnosis not present

## 2015-05-07 DIAGNOSIS — Z23 Encounter for immunization: Secondary | ICD-10-CM | POA: Diagnosis not present

## 2015-05-07 DIAGNOSIS — N2581 Secondary hyperparathyroidism of renal origin: Secondary | ICD-10-CM | POA: Diagnosis not present

## 2015-05-08 DIAGNOSIS — N2581 Secondary hyperparathyroidism of renal origin: Secondary | ICD-10-CM | POA: Diagnosis not present

## 2015-05-08 DIAGNOSIS — Z23 Encounter for immunization: Secondary | ICD-10-CM | POA: Diagnosis not present

## 2015-05-08 DIAGNOSIS — N186 End stage renal disease: Secondary | ICD-10-CM | POA: Diagnosis not present

## 2015-05-08 DIAGNOSIS — D631 Anemia in chronic kidney disease: Secondary | ICD-10-CM | POA: Diagnosis not present

## 2015-05-09 DIAGNOSIS — N2581 Secondary hyperparathyroidism of renal origin: Secondary | ICD-10-CM | POA: Diagnosis not present

## 2015-05-09 DIAGNOSIS — Z23 Encounter for immunization: Secondary | ICD-10-CM | POA: Diagnosis not present

## 2015-05-09 DIAGNOSIS — D631 Anemia in chronic kidney disease: Secondary | ICD-10-CM | POA: Diagnosis not present

## 2015-05-09 DIAGNOSIS — N186 End stage renal disease: Secondary | ICD-10-CM | POA: Diagnosis not present

## 2015-05-10 ENCOUNTER — Encounter (HOSPITAL_BASED_OUTPATIENT_CLINIC_OR_DEPARTMENT_OTHER): Payer: Self-pay | Admitting: *Deleted

## 2015-05-10 DIAGNOSIS — Z23 Encounter for immunization: Secondary | ICD-10-CM | POA: Diagnosis not present

## 2015-05-10 DIAGNOSIS — N2581 Secondary hyperparathyroidism of renal origin: Secondary | ICD-10-CM | POA: Diagnosis not present

## 2015-05-10 DIAGNOSIS — N186 End stage renal disease: Secondary | ICD-10-CM | POA: Diagnosis not present

## 2015-05-10 DIAGNOSIS — D631 Anemia in chronic kidney disease: Secondary | ICD-10-CM | POA: Diagnosis not present

## 2015-05-10 NOTE — Progress Notes (Signed)
Chart reviewed with Dr Orene Desanctis. Patient is mentally disabled but sister states she is cooperative with medical procedures and recieves home peritoneal dialysis for ESRD. She will come in for labs and EKG and be further assessed at that time.

## 2015-05-11 ENCOUNTER — Encounter (HOSPITAL_BASED_OUTPATIENT_CLINIC_OR_DEPARTMENT_OTHER): Admission: RE | Admit: 2015-05-11 | Payer: Medicare Other | Source: Ambulatory Visit

## 2015-05-11 ENCOUNTER — Encounter (HOSPITAL_BASED_OUTPATIENT_CLINIC_OR_DEPARTMENT_OTHER)
Admission: RE | Admit: 2015-05-11 | Discharge: 2015-05-11 | Disposition: A | Discharge status: Home / Self Care | Payer: Medicare Other | Source: Ambulatory Visit | Attending: Surgery | Admitting: Surgery

## 2015-05-11 DIAGNOSIS — D631 Anemia in chronic kidney disease: Secondary | ICD-10-CM | POA: Diagnosis not present

## 2015-05-11 DIAGNOSIS — N186 End stage renal disease: Secondary | ICD-10-CM | POA: Diagnosis not present

## 2015-05-11 DIAGNOSIS — I10 Essential (primary) hypertension: Secondary | ICD-10-CM | POA: Insufficient documentation

## 2015-05-11 DIAGNOSIS — E780 Pure hypercholesterolemia: Secondary | ICD-10-CM | POA: Insufficient documentation

## 2015-05-11 DIAGNOSIS — Z23 Encounter for immunization: Secondary | ICD-10-CM | POA: Diagnosis not present

## 2015-05-11 DIAGNOSIS — Z01810 Encounter for preprocedural cardiovascular examination: Secondary | ICD-10-CM | POA: Insufficient documentation

## 2015-05-11 DIAGNOSIS — Z01812 Encounter for preprocedural laboratory examination: Secondary | ICD-10-CM | POA: Diagnosis not present

## 2015-05-11 DIAGNOSIS — N2581 Secondary hyperparathyroidism of renal origin: Secondary | ICD-10-CM | POA: Diagnosis not present

## 2015-05-11 LAB — COMPREHENSIVE METABOLIC PANEL
ALK PHOS: 146 U/L — AB (ref 38–126)
ALT: 11 U/L — ABNORMAL LOW (ref 14–54)
ANION GAP: 10 (ref 5–15)
AST: 15 U/L (ref 15–41)
Albumin: 3.1 g/dL — ABNORMAL LOW (ref 3.5–5.0)
BUN: 38 mg/dL — ABNORMAL HIGH (ref 6–20)
CHLORIDE: 97 mmol/L — AB (ref 101–111)
CO2: 28 mmol/L (ref 22–32)
Calcium: 8.4 mg/dL — ABNORMAL LOW (ref 8.9–10.3)
Creatinine, Ser: 8.48 mg/dL — ABNORMAL HIGH (ref 0.44–1.00)
GFR calc Af Amer: 6 mL/min — ABNORMAL LOW (ref >60)
GFR, EST NON AFRICAN AMERICAN: 5 mL/min — AB (ref >60)
Glucose, Bld: 91 mg/dL (ref 65–99)
POTASSIUM: 4 mmol/L (ref 3.5–5.1)
SODIUM: 135 mmol/L (ref 135–145)
Total Bilirubin: 0.6 mg/dL (ref 0.3–1.2)
Total Protein: 6.6 g/dL (ref 6.5–8.1)

## 2015-05-11 NOTE — Progress Notes (Signed)
Lab results called to Dr Brantley Stage, states he will send patient back to see renal doctor before doing surgery. He will cancel surgery for now.

## 2015-05-12 ENCOUNTER — Ambulatory Visit (HOSPITAL_BASED_OUTPATIENT_CLINIC_OR_DEPARTMENT_OTHER): Admission: RE | Admit: 2015-05-12 | Payer: Medicare Other | Source: Ambulatory Visit | Admitting: Surgery

## 2015-05-12 DIAGNOSIS — D631 Anemia in chronic kidney disease: Secondary | ICD-10-CM | POA: Diagnosis not present

## 2015-05-12 DIAGNOSIS — N186 End stage renal disease: Secondary | ICD-10-CM | POA: Diagnosis not present

## 2015-05-12 DIAGNOSIS — N2581 Secondary hyperparathyroidism of renal origin: Secondary | ICD-10-CM | POA: Diagnosis not present

## 2015-05-12 DIAGNOSIS — Z23 Encounter for immunization: Secondary | ICD-10-CM | POA: Diagnosis not present

## 2015-05-12 HISTORY — DX: End stage renal disease: N18.6

## 2015-05-12 HISTORY — DX: End stage renal disease: Z99.2

## 2015-05-12 HISTORY — DX: Schizophrenia, unspecified: F20.9

## 2015-05-12 SURGERY — EXCISION LIPOMA
Anesthesia: General

## 2015-05-13 DIAGNOSIS — Z23 Encounter for immunization: Secondary | ICD-10-CM | POA: Diagnosis not present

## 2015-05-13 DIAGNOSIS — D631 Anemia in chronic kidney disease: Secondary | ICD-10-CM | POA: Diagnosis not present

## 2015-05-13 DIAGNOSIS — N186 End stage renal disease: Secondary | ICD-10-CM | POA: Diagnosis not present

## 2015-05-13 DIAGNOSIS — N2581 Secondary hyperparathyroidism of renal origin: Secondary | ICD-10-CM | POA: Diagnosis not present

## 2015-05-14 DIAGNOSIS — Z23 Encounter for immunization: Secondary | ICD-10-CM | POA: Diagnosis not present

## 2015-05-14 DIAGNOSIS — N186 End stage renal disease: Secondary | ICD-10-CM | POA: Diagnosis not present

## 2015-05-14 DIAGNOSIS — N2581 Secondary hyperparathyroidism of renal origin: Secondary | ICD-10-CM | POA: Diagnosis not present

## 2015-05-14 DIAGNOSIS — D631 Anemia in chronic kidney disease: Secondary | ICD-10-CM | POA: Diagnosis not present

## 2015-05-15 DIAGNOSIS — Z23 Encounter for immunization: Secondary | ICD-10-CM | POA: Diagnosis not present

## 2015-05-15 DIAGNOSIS — N2581 Secondary hyperparathyroidism of renal origin: Secondary | ICD-10-CM | POA: Diagnosis not present

## 2015-05-15 DIAGNOSIS — N186 End stage renal disease: Secondary | ICD-10-CM | POA: Diagnosis not present

## 2015-05-15 DIAGNOSIS — D631 Anemia in chronic kidney disease: Secondary | ICD-10-CM | POA: Diagnosis not present

## 2015-05-16 DIAGNOSIS — Z23 Encounter for immunization: Secondary | ICD-10-CM | POA: Diagnosis not present

## 2015-05-16 DIAGNOSIS — D631 Anemia in chronic kidney disease: Secondary | ICD-10-CM | POA: Diagnosis not present

## 2015-05-16 DIAGNOSIS — N2581 Secondary hyperparathyroidism of renal origin: Secondary | ICD-10-CM | POA: Diagnosis not present

## 2015-05-16 DIAGNOSIS — N186 End stage renal disease: Secondary | ICD-10-CM | POA: Diagnosis not present

## 2015-05-17 DIAGNOSIS — N2581 Secondary hyperparathyroidism of renal origin: Secondary | ICD-10-CM | POA: Diagnosis not present

## 2015-05-17 DIAGNOSIS — Z23 Encounter for immunization: Secondary | ICD-10-CM | POA: Diagnosis not present

## 2015-05-17 DIAGNOSIS — D631 Anemia in chronic kidney disease: Secondary | ICD-10-CM | POA: Diagnosis not present

## 2015-05-17 DIAGNOSIS — N186 End stage renal disease: Secondary | ICD-10-CM | POA: Diagnosis not present

## 2015-05-18 DIAGNOSIS — N186 End stage renal disease: Secondary | ICD-10-CM | POA: Diagnosis not present

## 2015-05-18 DIAGNOSIS — Z23 Encounter for immunization: Secondary | ICD-10-CM | POA: Diagnosis not present

## 2015-05-18 DIAGNOSIS — N2581 Secondary hyperparathyroidism of renal origin: Secondary | ICD-10-CM | POA: Diagnosis not present

## 2015-05-18 DIAGNOSIS — D631 Anemia in chronic kidney disease: Secondary | ICD-10-CM | POA: Diagnosis not present

## 2015-05-19 DIAGNOSIS — D631 Anemia in chronic kidney disease: Secondary | ICD-10-CM | POA: Diagnosis not present

## 2015-05-19 DIAGNOSIS — N2581 Secondary hyperparathyroidism of renal origin: Secondary | ICD-10-CM | POA: Diagnosis not present

## 2015-05-19 DIAGNOSIS — N186 End stage renal disease: Secondary | ICD-10-CM | POA: Diagnosis not present

## 2015-05-19 DIAGNOSIS — Z23 Encounter for immunization: Secondary | ICD-10-CM | POA: Diagnosis not present

## 2015-05-20 DIAGNOSIS — N186 End stage renal disease: Secondary | ICD-10-CM | POA: Diagnosis not present

## 2015-05-20 DIAGNOSIS — Z23 Encounter for immunization: Secondary | ICD-10-CM | POA: Diagnosis not present

## 2015-05-20 DIAGNOSIS — N2581 Secondary hyperparathyroidism of renal origin: Secondary | ICD-10-CM | POA: Diagnosis not present

## 2015-05-20 DIAGNOSIS — D631 Anemia in chronic kidney disease: Secondary | ICD-10-CM | POA: Diagnosis not present

## 2015-05-21 DIAGNOSIS — N186 End stage renal disease: Secondary | ICD-10-CM | POA: Diagnosis not present

## 2015-05-21 DIAGNOSIS — D631 Anemia in chronic kidney disease: Secondary | ICD-10-CM | POA: Diagnosis not present

## 2015-05-21 DIAGNOSIS — N2581 Secondary hyperparathyroidism of renal origin: Secondary | ICD-10-CM | POA: Diagnosis not present

## 2015-05-21 DIAGNOSIS — Z23 Encounter for immunization: Secondary | ICD-10-CM | POA: Diagnosis not present

## 2015-05-22 DIAGNOSIS — D631 Anemia in chronic kidney disease: Secondary | ICD-10-CM | POA: Diagnosis not present

## 2015-05-22 DIAGNOSIS — Z23 Encounter for immunization: Secondary | ICD-10-CM | POA: Diagnosis not present

## 2015-05-22 DIAGNOSIS — N186 End stage renal disease: Secondary | ICD-10-CM | POA: Diagnosis not present

## 2015-05-22 DIAGNOSIS — N2581 Secondary hyperparathyroidism of renal origin: Secondary | ICD-10-CM | POA: Diagnosis not present

## 2015-05-23 DIAGNOSIS — N2581 Secondary hyperparathyroidism of renal origin: Secondary | ICD-10-CM | POA: Diagnosis not present

## 2015-05-23 DIAGNOSIS — N186 End stage renal disease: Secondary | ICD-10-CM | POA: Diagnosis not present

## 2015-05-23 DIAGNOSIS — Z992 Dependence on renal dialysis: Secondary | ICD-10-CM | POA: Diagnosis not present

## 2015-05-23 DIAGNOSIS — Z23 Encounter for immunization: Secondary | ICD-10-CM | POA: Diagnosis not present

## 2015-05-23 DIAGNOSIS — D631 Anemia in chronic kidney disease: Secondary | ICD-10-CM | POA: Diagnosis not present

## 2015-05-24 DIAGNOSIS — N186 End stage renal disease: Secondary | ICD-10-CM | POA: Diagnosis not present

## 2015-05-24 DIAGNOSIS — N2581 Secondary hyperparathyroidism of renal origin: Secondary | ICD-10-CM | POA: Diagnosis not present

## 2015-05-24 DIAGNOSIS — D631 Anemia in chronic kidney disease: Secondary | ICD-10-CM | POA: Diagnosis not present

## 2015-05-25 DIAGNOSIS — N186 End stage renal disease: Secondary | ICD-10-CM | POA: Diagnosis not present

## 2015-05-25 DIAGNOSIS — D631 Anemia in chronic kidney disease: Secondary | ICD-10-CM | POA: Diagnosis not present

## 2015-05-25 DIAGNOSIS — N2581 Secondary hyperparathyroidism of renal origin: Secondary | ICD-10-CM | POA: Diagnosis not present

## 2015-05-26 DIAGNOSIS — D631 Anemia in chronic kidney disease: Secondary | ICD-10-CM | POA: Diagnosis not present

## 2015-05-26 DIAGNOSIS — N2581 Secondary hyperparathyroidism of renal origin: Secondary | ICD-10-CM | POA: Diagnosis not present

## 2015-05-26 DIAGNOSIS — N186 End stage renal disease: Secondary | ICD-10-CM | POA: Diagnosis not present

## 2015-05-27 DIAGNOSIS — D631 Anemia in chronic kidney disease: Secondary | ICD-10-CM | POA: Diagnosis not present

## 2015-05-27 DIAGNOSIS — N2581 Secondary hyperparathyroidism of renal origin: Secondary | ICD-10-CM | POA: Diagnosis not present

## 2015-05-27 DIAGNOSIS — N186 End stage renal disease: Secondary | ICD-10-CM | POA: Diagnosis not present

## 2015-05-28 DIAGNOSIS — N2581 Secondary hyperparathyroidism of renal origin: Secondary | ICD-10-CM | POA: Diagnosis not present

## 2015-05-28 DIAGNOSIS — D631 Anemia in chronic kidney disease: Secondary | ICD-10-CM | POA: Diagnosis not present

## 2015-05-28 DIAGNOSIS — N186 End stage renal disease: Secondary | ICD-10-CM | POA: Diagnosis not present

## 2015-05-29 DIAGNOSIS — N2581 Secondary hyperparathyroidism of renal origin: Secondary | ICD-10-CM | POA: Diagnosis not present

## 2015-05-29 DIAGNOSIS — N186 End stage renal disease: Secondary | ICD-10-CM | POA: Diagnosis not present

## 2015-05-29 DIAGNOSIS — D631 Anemia in chronic kidney disease: Secondary | ICD-10-CM | POA: Diagnosis not present

## 2015-05-30 DIAGNOSIS — N186 End stage renal disease: Secondary | ICD-10-CM | POA: Diagnosis not present

## 2015-05-30 DIAGNOSIS — D631 Anemia in chronic kidney disease: Secondary | ICD-10-CM | POA: Diagnosis not present

## 2015-05-30 DIAGNOSIS — N2581 Secondary hyperparathyroidism of renal origin: Secondary | ICD-10-CM | POA: Diagnosis not present

## 2015-05-31 DIAGNOSIS — N2581 Secondary hyperparathyroidism of renal origin: Secondary | ICD-10-CM | POA: Diagnosis not present

## 2015-05-31 DIAGNOSIS — N186 End stage renal disease: Secondary | ICD-10-CM | POA: Diagnosis not present

## 2015-05-31 DIAGNOSIS — D631 Anemia in chronic kidney disease: Secondary | ICD-10-CM | POA: Diagnosis not present

## 2015-06-01 DIAGNOSIS — D631 Anemia in chronic kidney disease: Secondary | ICD-10-CM | POA: Diagnosis not present

## 2015-06-01 DIAGNOSIS — N2581 Secondary hyperparathyroidism of renal origin: Secondary | ICD-10-CM | POA: Diagnosis not present

## 2015-06-01 DIAGNOSIS — N186 End stage renal disease: Secondary | ICD-10-CM | POA: Diagnosis not present

## 2015-06-02 DIAGNOSIS — N186 End stage renal disease: Secondary | ICD-10-CM | POA: Diagnosis not present

## 2015-06-02 DIAGNOSIS — D631 Anemia in chronic kidney disease: Secondary | ICD-10-CM | POA: Diagnosis not present

## 2015-06-02 DIAGNOSIS — N2581 Secondary hyperparathyroidism of renal origin: Secondary | ICD-10-CM | POA: Diagnosis not present

## 2015-06-03 DIAGNOSIS — N2581 Secondary hyperparathyroidism of renal origin: Secondary | ICD-10-CM | POA: Diagnosis not present

## 2015-06-03 DIAGNOSIS — D631 Anemia in chronic kidney disease: Secondary | ICD-10-CM | POA: Diagnosis not present

## 2015-06-03 DIAGNOSIS — N186 End stage renal disease: Secondary | ICD-10-CM | POA: Diagnosis not present

## 2015-06-04 DIAGNOSIS — N2581 Secondary hyperparathyroidism of renal origin: Secondary | ICD-10-CM | POA: Diagnosis not present

## 2015-06-04 DIAGNOSIS — D631 Anemia in chronic kidney disease: Secondary | ICD-10-CM | POA: Diagnosis not present

## 2015-06-04 DIAGNOSIS — N186 End stage renal disease: Secondary | ICD-10-CM | POA: Diagnosis not present

## 2015-06-05 DIAGNOSIS — N2581 Secondary hyperparathyroidism of renal origin: Secondary | ICD-10-CM | POA: Diagnosis not present

## 2015-06-05 DIAGNOSIS — N186 End stage renal disease: Secondary | ICD-10-CM | POA: Diagnosis not present

## 2015-06-05 DIAGNOSIS — D631 Anemia in chronic kidney disease: Secondary | ICD-10-CM | POA: Diagnosis not present

## 2015-06-06 DIAGNOSIS — D631 Anemia in chronic kidney disease: Secondary | ICD-10-CM | POA: Diagnosis not present

## 2015-06-06 DIAGNOSIS — N2581 Secondary hyperparathyroidism of renal origin: Secondary | ICD-10-CM | POA: Diagnosis not present

## 2015-06-06 DIAGNOSIS — N186 End stage renal disease: Secondary | ICD-10-CM | POA: Diagnosis not present

## 2015-06-07 DIAGNOSIS — N2581 Secondary hyperparathyroidism of renal origin: Secondary | ICD-10-CM | POA: Diagnosis not present

## 2015-06-07 DIAGNOSIS — D631 Anemia in chronic kidney disease: Secondary | ICD-10-CM | POA: Diagnosis not present

## 2015-06-07 DIAGNOSIS — N186 End stage renal disease: Secondary | ICD-10-CM | POA: Diagnosis not present

## 2015-06-08 DIAGNOSIS — D631 Anemia in chronic kidney disease: Secondary | ICD-10-CM | POA: Diagnosis not present

## 2015-06-08 DIAGNOSIS — N186 End stage renal disease: Secondary | ICD-10-CM | POA: Diagnosis not present

## 2015-06-08 DIAGNOSIS — N2581 Secondary hyperparathyroidism of renal origin: Secondary | ICD-10-CM | POA: Diagnosis not present

## 2015-06-09 DIAGNOSIS — N2581 Secondary hyperparathyroidism of renal origin: Secondary | ICD-10-CM | POA: Diagnosis not present

## 2015-06-09 DIAGNOSIS — D631 Anemia in chronic kidney disease: Secondary | ICD-10-CM | POA: Diagnosis not present

## 2015-06-09 DIAGNOSIS — N186 End stage renal disease: Secondary | ICD-10-CM | POA: Diagnosis not present

## 2015-06-10 DIAGNOSIS — F2081 Schizophreniform disorder: Secondary | ICD-10-CM | POA: Diagnosis not present

## 2015-06-10 DIAGNOSIS — N2581 Secondary hyperparathyroidism of renal origin: Secondary | ICD-10-CM | POA: Diagnosis not present

## 2015-06-10 DIAGNOSIS — D631 Anemia in chronic kidney disease: Secondary | ICD-10-CM | POA: Diagnosis not present

## 2015-06-10 DIAGNOSIS — N186 End stage renal disease: Secondary | ICD-10-CM | POA: Diagnosis not present

## 2015-06-11 DIAGNOSIS — N186 End stage renal disease: Secondary | ICD-10-CM | POA: Diagnosis not present

## 2015-06-11 DIAGNOSIS — D631 Anemia in chronic kidney disease: Secondary | ICD-10-CM | POA: Diagnosis not present

## 2015-06-11 DIAGNOSIS — N2581 Secondary hyperparathyroidism of renal origin: Secondary | ICD-10-CM | POA: Diagnosis not present

## 2015-06-12 DIAGNOSIS — D631 Anemia in chronic kidney disease: Secondary | ICD-10-CM | POA: Diagnosis not present

## 2015-06-12 DIAGNOSIS — N186 End stage renal disease: Secondary | ICD-10-CM | POA: Diagnosis not present

## 2015-06-12 DIAGNOSIS — N2581 Secondary hyperparathyroidism of renal origin: Secondary | ICD-10-CM | POA: Diagnosis not present

## 2015-06-13 DIAGNOSIS — N186 End stage renal disease: Secondary | ICD-10-CM | POA: Diagnosis not present

## 2015-06-13 DIAGNOSIS — D631 Anemia in chronic kidney disease: Secondary | ICD-10-CM | POA: Diagnosis not present

## 2015-06-13 DIAGNOSIS — N2581 Secondary hyperparathyroidism of renal origin: Secondary | ICD-10-CM | POA: Diagnosis not present

## 2015-06-14 DIAGNOSIS — N2581 Secondary hyperparathyroidism of renal origin: Secondary | ICD-10-CM | POA: Diagnosis not present

## 2015-06-14 DIAGNOSIS — N186 End stage renal disease: Secondary | ICD-10-CM | POA: Diagnosis not present

## 2015-06-14 DIAGNOSIS — D631 Anemia in chronic kidney disease: Secondary | ICD-10-CM | POA: Diagnosis not present

## 2015-06-15 DIAGNOSIS — N186 End stage renal disease: Secondary | ICD-10-CM | POA: Diagnosis not present

## 2015-06-15 DIAGNOSIS — D631 Anemia in chronic kidney disease: Secondary | ICD-10-CM | POA: Diagnosis not present

## 2015-06-15 DIAGNOSIS — N2581 Secondary hyperparathyroidism of renal origin: Secondary | ICD-10-CM | POA: Diagnosis not present

## 2015-06-16 DIAGNOSIS — D631 Anemia in chronic kidney disease: Secondary | ICD-10-CM | POA: Diagnosis not present

## 2015-06-16 DIAGNOSIS — N186 End stage renal disease: Secondary | ICD-10-CM | POA: Diagnosis not present

## 2015-06-16 DIAGNOSIS — N2581 Secondary hyperparathyroidism of renal origin: Secondary | ICD-10-CM | POA: Diagnosis not present

## 2015-06-17 DIAGNOSIS — D631 Anemia in chronic kidney disease: Secondary | ICD-10-CM | POA: Diagnosis not present

## 2015-06-17 DIAGNOSIS — N2581 Secondary hyperparathyroidism of renal origin: Secondary | ICD-10-CM | POA: Diagnosis not present

## 2015-06-17 DIAGNOSIS — N186 End stage renal disease: Secondary | ICD-10-CM | POA: Diagnosis not present

## 2015-06-18 DIAGNOSIS — N186 End stage renal disease: Secondary | ICD-10-CM | POA: Diagnosis not present

## 2015-06-18 DIAGNOSIS — D631 Anemia in chronic kidney disease: Secondary | ICD-10-CM | POA: Diagnosis not present

## 2015-06-18 DIAGNOSIS — N2581 Secondary hyperparathyroidism of renal origin: Secondary | ICD-10-CM | POA: Diagnosis not present

## 2015-06-19 DIAGNOSIS — N2581 Secondary hyperparathyroidism of renal origin: Secondary | ICD-10-CM | POA: Diagnosis not present

## 2015-06-19 DIAGNOSIS — D631 Anemia in chronic kidney disease: Secondary | ICD-10-CM | POA: Diagnosis not present

## 2015-06-19 DIAGNOSIS — N186 End stage renal disease: Secondary | ICD-10-CM | POA: Diagnosis not present

## 2015-06-20 DIAGNOSIS — N186 End stage renal disease: Secondary | ICD-10-CM | POA: Diagnosis not present

## 2015-06-20 DIAGNOSIS — D631 Anemia in chronic kidney disease: Secondary | ICD-10-CM | POA: Diagnosis not present

## 2015-06-20 DIAGNOSIS — N2581 Secondary hyperparathyroidism of renal origin: Secondary | ICD-10-CM | POA: Diagnosis not present

## 2015-06-21 DIAGNOSIS — D631 Anemia in chronic kidney disease: Secondary | ICD-10-CM | POA: Diagnosis not present

## 2015-06-21 DIAGNOSIS — N2581 Secondary hyperparathyroidism of renal origin: Secondary | ICD-10-CM | POA: Diagnosis not present

## 2015-06-21 DIAGNOSIS — N186 End stage renal disease: Secondary | ICD-10-CM | POA: Diagnosis not present

## 2015-06-22 DIAGNOSIS — D631 Anemia in chronic kidney disease: Secondary | ICD-10-CM | POA: Diagnosis not present

## 2015-06-22 DIAGNOSIS — N186 End stage renal disease: Secondary | ICD-10-CM | POA: Diagnosis not present

## 2015-06-22 DIAGNOSIS — N2581 Secondary hyperparathyroidism of renal origin: Secondary | ICD-10-CM | POA: Diagnosis not present

## 2015-06-23 DIAGNOSIS — N2581 Secondary hyperparathyroidism of renal origin: Secondary | ICD-10-CM | POA: Diagnosis not present

## 2015-06-23 DIAGNOSIS — D631 Anemia in chronic kidney disease: Secondary | ICD-10-CM | POA: Diagnosis not present

## 2015-06-23 DIAGNOSIS — N186 End stage renal disease: Secondary | ICD-10-CM | POA: Diagnosis not present

## 2015-06-23 DIAGNOSIS — Z992 Dependence on renal dialysis: Secondary | ICD-10-CM | POA: Diagnosis not present

## 2015-06-24 DIAGNOSIS — D631 Anemia in chronic kidney disease: Secondary | ICD-10-CM | POA: Diagnosis not present

## 2015-06-24 DIAGNOSIS — N186 End stage renal disease: Secondary | ICD-10-CM | POA: Diagnosis not present

## 2015-06-24 DIAGNOSIS — N2581 Secondary hyperparathyroidism of renal origin: Secondary | ICD-10-CM | POA: Diagnosis not present

## 2015-07-06 DIAGNOSIS — E785 Hyperlipidemia, unspecified: Secondary | ICD-10-CM | POA: Diagnosis not present

## 2015-07-06 DIAGNOSIS — F319 Bipolar disorder, unspecified: Secondary | ICD-10-CM | POA: Diagnosis not present

## 2015-07-06 DIAGNOSIS — N186 End stage renal disease: Secondary | ICD-10-CM | POA: Diagnosis not present

## 2015-07-06 DIAGNOSIS — I10 Essential (primary) hypertension: Secondary | ICD-10-CM | POA: Diagnosis not present

## 2015-07-06 DIAGNOSIS — M109 Gout, unspecified: Secondary | ICD-10-CM | POA: Diagnosis not present

## 2015-07-06 DIAGNOSIS — E039 Hypothyroidism, unspecified: Secondary | ICD-10-CM | POA: Diagnosis not present

## 2015-07-06 DIAGNOSIS — N3281 Overactive bladder: Secondary | ICD-10-CM | POA: Diagnosis not present

## 2015-07-06 DIAGNOSIS — I119 Hypertensive heart disease without heart failure: Secondary | ICD-10-CM | POA: Diagnosis not present

## 2015-07-21 DIAGNOSIS — R300 Dysuria: Secondary | ICD-10-CM | POA: Diagnosis not present

## 2015-07-24 DIAGNOSIS — N186 End stage renal disease: Secondary | ICD-10-CM | POA: Diagnosis not present

## 2015-07-24 DIAGNOSIS — D631 Anemia in chronic kidney disease: Secondary | ICD-10-CM | POA: Diagnosis not present

## 2015-07-24 DIAGNOSIS — N2581 Secondary hyperparathyroidism of renal origin: Secondary | ICD-10-CM | POA: Diagnosis not present

## 2015-07-25 DIAGNOSIS — N186 End stage renal disease: Secondary | ICD-10-CM | POA: Diagnosis not present

## 2015-07-25 DIAGNOSIS — N2581 Secondary hyperparathyroidism of renal origin: Secondary | ICD-10-CM | POA: Diagnosis not present

## 2015-07-25 DIAGNOSIS — D631 Anemia in chronic kidney disease: Secondary | ICD-10-CM | POA: Diagnosis not present

## 2015-07-26 DIAGNOSIS — N186 End stage renal disease: Secondary | ICD-10-CM | POA: Diagnosis not present

## 2015-07-26 DIAGNOSIS — N2581 Secondary hyperparathyroidism of renal origin: Secondary | ICD-10-CM | POA: Diagnosis not present

## 2015-07-26 DIAGNOSIS — D631 Anemia in chronic kidney disease: Secondary | ICD-10-CM | POA: Diagnosis not present

## 2015-07-27 DIAGNOSIS — N186 End stage renal disease: Secondary | ICD-10-CM | POA: Diagnosis not present

## 2015-07-27 DIAGNOSIS — N2581 Secondary hyperparathyroidism of renal origin: Secondary | ICD-10-CM | POA: Diagnosis not present

## 2015-07-27 DIAGNOSIS — D631 Anemia in chronic kidney disease: Secondary | ICD-10-CM | POA: Diagnosis not present

## 2015-07-28 DIAGNOSIS — D631 Anemia in chronic kidney disease: Secondary | ICD-10-CM | POA: Diagnosis not present

## 2015-07-28 DIAGNOSIS — N2581 Secondary hyperparathyroidism of renal origin: Secondary | ICD-10-CM | POA: Diagnosis not present

## 2015-07-28 DIAGNOSIS — N186 End stage renal disease: Secondary | ICD-10-CM | POA: Diagnosis not present

## 2015-07-29 DIAGNOSIS — D631 Anemia in chronic kidney disease: Secondary | ICD-10-CM | POA: Diagnosis not present

## 2015-07-29 DIAGNOSIS — N2581 Secondary hyperparathyroidism of renal origin: Secondary | ICD-10-CM | POA: Diagnosis not present

## 2015-07-29 DIAGNOSIS — N186 End stage renal disease: Secondary | ICD-10-CM | POA: Diagnosis not present

## 2015-07-30 DIAGNOSIS — N2581 Secondary hyperparathyroidism of renal origin: Secondary | ICD-10-CM | POA: Diagnosis not present

## 2015-07-30 DIAGNOSIS — N186 End stage renal disease: Secondary | ICD-10-CM | POA: Diagnosis not present

## 2015-07-30 DIAGNOSIS — D631 Anemia in chronic kidney disease: Secondary | ICD-10-CM | POA: Diagnosis not present

## 2015-07-31 DIAGNOSIS — D631 Anemia in chronic kidney disease: Secondary | ICD-10-CM | POA: Diagnosis not present

## 2015-07-31 DIAGNOSIS — N186 End stage renal disease: Secondary | ICD-10-CM | POA: Diagnosis not present

## 2015-07-31 DIAGNOSIS — N2581 Secondary hyperparathyroidism of renal origin: Secondary | ICD-10-CM | POA: Diagnosis not present

## 2015-08-01 DIAGNOSIS — D631 Anemia in chronic kidney disease: Secondary | ICD-10-CM | POA: Diagnosis not present

## 2015-08-01 DIAGNOSIS — N186 End stage renal disease: Secondary | ICD-10-CM | POA: Diagnosis not present

## 2015-08-01 DIAGNOSIS — N2581 Secondary hyperparathyroidism of renal origin: Secondary | ICD-10-CM | POA: Diagnosis not present

## 2015-08-02 DIAGNOSIS — N186 End stage renal disease: Secondary | ICD-10-CM | POA: Diagnosis not present

## 2015-08-02 DIAGNOSIS — N2581 Secondary hyperparathyroidism of renal origin: Secondary | ICD-10-CM | POA: Diagnosis not present

## 2015-08-02 DIAGNOSIS — D631 Anemia in chronic kidney disease: Secondary | ICD-10-CM | POA: Diagnosis not present

## 2015-08-03 DIAGNOSIS — D631 Anemia in chronic kidney disease: Secondary | ICD-10-CM | POA: Diagnosis not present

## 2015-08-03 DIAGNOSIS — N186 End stage renal disease: Secondary | ICD-10-CM | POA: Diagnosis not present

## 2015-08-03 DIAGNOSIS — N2581 Secondary hyperparathyroidism of renal origin: Secondary | ICD-10-CM | POA: Diagnosis not present

## 2015-08-04 DIAGNOSIS — N186 End stage renal disease: Secondary | ICD-10-CM | POA: Diagnosis not present

## 2015-08-04 DIAGNOSIS — N2581 Secondary hyperparathyroidism of renal origin: Secondary | ICD-10-CM | POA: Diagnosis not present

## 2015-08-04 DIAGNOSIS — D631 Anemia in chronic kidney disease: Secondary | ICD-10-CM | POA: Diagnosis not present

## 2015-08-05 DIAGNOSIS — N186 End stage renal disease: Secondary | ICD-10-CM | POA: Diagnosis not present

## 2015-08-05 DIAGNOSIS — N2581 Secondary hyperparathyroidism of renal origin: Secondary | ICD-10-CM | POA: Diagnosis not present

## 2015-08-05 DIAGNOSIS — D631 Anemia in chronic kidney disease: Secondary | ICD-10-CM | POA: Diagnosis not present

## 2015-08-06 DIAGNOSIS — N2581 Secondary hyperparathyroidism of renal origin: Secondary | ICD-10-CM | POA: Diagnosis not present

## 2015-08-06 DIAGNOSIS — D631 Anemia in chronic kidney disease: Secondary | ICD-10-CM | POA: Diagnosis not present

## 2015-08-06 DIAGNOSIS — N186 End stage renal disease: Secondary | ICD-10-CM | POA: Diagnosis not present

## 2015-08-07 DIAGNOSIS — N2581 Secondary hyperparathyroidism of renal origin: Secondary | ICD-10-CM | POA: Diagnosis not present

## 2015-08-07 DIAGNOSIS — N186 End stage renal disease: Secondary | ICD-10-CM | POA: Diagnosis not present

## 2015-08-07 DIAGNOSIS — D631 Anemia in chronic kidney disease: Secondary | ICD-10-CM | POA: Diagnosis not present

## 2015-08-08 DIAGNOSIS — N186 End stage renal disease: Secondary | ICD-10-CM | POA: Diagnosis not present

## 2015-08-08 DIAGNOSIS — N2581 Secondary hyperparathyroidism of renal origin: Secondary | ICD-10-CM | POA: Diagnosis not present

## 2015-08-08 DIAGNOSIS — D631 Anemia in chronic kidney disease: Secondary | ICD-10-CM | POA: Diagnosis not present

## 2015-08-09 DIAGNOSIS — D631 Anemia in chronic kidney disease: Secondary | ICD-10-CM | POA: Diagnosis not present

## 2015-08-09 DIAGNOSIS — N2581 Secondary hyperparathyroidism of renal origin: Secondary | ICD-10-CM | POA: Diagnosis not present

## 2015-08-09 DIAGNOSIS — N186 End stage renal disease: Secondary | ICD-10-CM | POA: Diagnosis not present

## 2015-08-10 DIAGNOSIS — N2581 Secondary hyperparathyroidism of renal origin: Secondary | ICD-10-CM | POA: Diagnosis not present

## 2015-08-10 DIAGNOSIS — D631 Anemia in chronic kidney disease: Secondary | ICD-10-CM | POA: Diagnosis not present

## 2015-08-10 DIAGNOSIS — N186 End stage renal disease: Secondary | ICD-10-CM | POA: Diagnosis not present

## 2015-08-11 DIAGNOSIS — N2581 Secondary hyperparathyroidism of renal origin: Secondary | ICD-10-CM | POA: Diagnosis not present

## 2015-08-11 DIAGNOSIS — N186 End stage renal disease: Secondary | ICD-10-CM | POA: Diagnosis not present

## 2015-08-11 DIAGNOSIS — D631 Anemia in chronic kidney disease: Secondary | ICD-10-CM | POA: Diagnosis not present

## 2015-08-12 DIAGNOSIS — D631 Anemia in chronic kidney disease: Secondary | ICD-10-CM | POA: Diagnosis not present

## 2015-08-12 DIAGNOSIS — N2581 Secondary hyperparathyroidism of renal origin: Secondary | ICD-10-CM | POA: Diagnosis not present

## 2015-08-12 DIAGNOSIS — N186 End stage renal disease: Secondary | ICD-10-CM | POA: Diagnosis not present

## 2015-08-13 DIAGNOSIS — N186 End stage renal disease: Secondary | ICD-10-CM | POA: Diagnosis not present

## 2015-08-13 DIAGNOSIS — D631 Anemia in chronic kidney disease: Secondary | ICD-10-CM | POA: Diagnosis not present

## 2015-08-13 DIAGNOSIS — N2581 Secondary hyperparathyroidism of renal origin: Secondary | ICD-10-CM | POA: Diagnosis not present

## 2015-08-14 DIAGNOSIS — N2581 Secondary hyperparathyroidism of renal origin: Secondary | ICD-10-CM | POA: Diagnosis not present

## 2015-08-14 DIAGNOSIS — N186 End stage renal disease: Secondary | ICD-10-CM | POA: Diagnosis not present

## 2015-08-14 DIAGNOSIS — D631 Anemia in chronic kidney disease: Secondary | ICD-10-CM | POA: Diagnosis not present

## 2015-08-15 DIAGNOSIS — N2581 Secondary hyperparathyroidism of renal origin: Secondary | ICD-10-CM | POA: Diagnosis not present

## 2015-08-15 DIAGNOSIS — N186 End stage renal disease: Secondary | ICD-10-CM | POA: Diagnosis not present

## 2015-08-15 DIAGNOSIS — D631 Anemia in chronic kidney disease: Secondary | ICD-10-CM | POA: Diagnosis not present

## 2015-08-16 DIAGNOSIS — D631 Anemia in chronic kidney disease: Secondary | ICD-10-CM | POA: Diagnosis not present

## 2015-08-16 DIAGNOSIS — N2581 Secondary hyperparathyroidism of renal origin: Secondary | ICD-10-CM | POA: Diagnosis not present

## 2015-08-16 DIAGNOSIS — N186 End stage renal disease: Secondary | ICD-10-CM | POA: Diagnosis not present

## 2015-08-17 DIAGNOSIS — N186 End stage renal disease: Secondary | ICD-10-CM | POA: Diagnosis not present

## 2015-08-17 DIAGNOSIS — D631 Anemia in chronic kidney disease: Secondary | ICD-10-CM | POA: Diagnosis not present

## 2015-08-17 DIAGNOSIS — N2581 Secondary hyperparathyroidism of renal origin: Secondary | ICD-10-CM | POA: Diagnosis not present

## 2015-08-18 DIAGNOSIS — D631 Anemia in chronic kidney disease: Secondary | ICD-10-CM | POA: Diagnosis not present

## 2015-08-18 DIAGNOSIS — N2581 Secondary hyperparathyroidism of renal origin: Secondary | ICD-10-CM | POA: Diagnosis not present

## 2015-08-18 DIAGNOSIS — N186 End stage renal disease: Secondary | ICD-10-CM | POA: Diagnosis not present

## 2015-08-19 DIAGNOSIS — N2581 Secondary hyperparathyroidism of renal origin: Secondary | ICD-10-CM | POA: Diagnosis not present

## 2015-08-19 DIAGNOSIS — D631 Anemia in chronic kidney disease: Secondary | ICD-10-CM | POA: Diagnosis not present

## 2015-08-19 DIAGNOSIS — N186 End stage renal disease: Secondary | ICD-10-CM | POA: Diagnosis not present

## 2015-08-20 DIAGNOSIS — N186 End stage renal disease: Secondary | ICD-10-CM | POA: Diagnosis not present

## 2015-08-20 DIAGNOSIS — D631 Anemia in chronic kidney disease: Secondary | ICD-10-CM | POA: Diagnosis not present

## 2015-08-20 DIAGNOSIS — N2581 Secondary hyperparathyroidism of renal origin: Secondary | ICD-10-CM | POA: Diagnosis not present

## 2015-08-21 DIAGNOSIS — D631 Anemia in chronic kidney disease: Secondary | ICD-10-CM | POA: Diagnosis not present

## 2015-08-21 DIAGNOSIS — N2581 Secondary hyperparathyroidism of renal origin: Secondary | ICD-10-CM | POA: Diagnosis not present

## 2015-08-21 DIAGNOSIS — N186 End stage renal disease: Secondary | ICD-10-CM | POA: Diagnosis not present

## 2015-08-22 DIAGNOSIS — D631 Anemia in chronic kidney disease: Secondary | ICD-10-CM | POA: Diagnosis not present

## 2015-08-22 DIAGNOSIS — N2581 Secondary hyperparathyroidism of renal origin: Secondary | ICD-10-CM | POA: Diagnosis not present

## 2015-08-22 DIAGNOSIS — N186 End stage renal disease: Secondary | ICD-10-CM | POA: Diagnosis not present

## 2015-08-23 DIAGNOSIS — Z992 Dependence on renal dialysis: Secondary | ICD-10-CM | POA: Diagnosis not present

## 2015-08-23 DIAGNOSIS — N186 End stage renal disease: Secondary | ICD-10-CM | POA: Diagnosis not present

## 2015-08-23 DIAGNOSIS — N2581 Secondary hyperparathyroidism of renal origin: Secondary | ICD-10-CM | POA: Diagnosis not present

## 2015-08-23 DIAGNOSIS — D631 Anemia in chronic kidney disease: Secondary | ICD-10-CM | POA: Diagnosis not present

## 2015-08-24 DIAGNOSIS — N186 End stage renal disease: Secondary | ICD-10-CM | POA: Diagnosis not present

## 2015-08-24 DIAGNOSIS — N2581 Secondary hyperparathyroidism of renal origin: Secondary | ICD-10-CM | POA: Diagnosis not present

## 2015-08-24 DIAGNOSIS — D631 Anemia in chronic kidney disease: Secondary | ICD-10-CM | POA: Diagnosis not present

## 2015-08-30 DIAGNOSIS — M109 Gout, unspecified: Secondary | ICD-10-CM | POA: Diagnosis not present

## 2015-09-14 ENCOUNTER — Emergency Department (HOSPITAL_COMMUNITY): Payer: Medicare Other

## 2015-09-14 ENCOUNTER — Encounter (HOSPITAL_COMMUNITY): Payer: Self-pay | Admitting: Emergency Medicine

## 2015-09-14 ENCOUNTER — Inpatient Hospital Stay (HOSPITAL_COMMUNITY)
Admission: EM | Admit: 2015-09-14 | Discharge: 2015-09-17 | DRG: 100 | Disposition: A | Discharge status: Home / Self Care | Payer: Medicare Other | Attending: Internal Medicine | Admitting: Internal Medicine

## 2015-09-14 DIAGNOSIS — N186 End stage renal disease: Secondary | ICD-10-CM | POA: Diagnosis not present

## 2015-09-14 DIAGNOSIS — Z23 Encounter for immunization: Secondary | ICD-10-CM | POA: Diagnosis not present

## 2015-09-14 DIAGNOSIS — M109 Gout, unspecified: Secondary | ICD-10-CM | POA: Diagnosis present

## 2015-09-14 DIAGNOSIS — Z9114 Patient's other noncompliance with medication regimen: Secondary | ICD-10-CM | POA: Diagnosis not present

## 2015-09-14 DIAGNOSIS — R4182 Altered mental status, unspecified: Secondary | ICD-10-CM | POA: Diagnosis not present

## 2015-09-14 DIAGNOSIS — E039 Hypothyroidism, unspecified: Secondary | ICD-10-CM | POA: Diagnosis present

## 2015-09-14 DIAGNOSIS — R300 Dysuria: Secondary | ICD-10-CM | POA: Diagnosis not present

## 2015-09-14 DIAGNOSIS — F259 Schizoaffective disorder, unspecified: Secondary | ICD-10-CM | POA: Diagnosis present

## 2015-09-14 DIAGNOSIS — I120 Hypertensive chronic kidney disease with stage 5 chronic kidney disease or end stage renal disease: Secondary | ICD-10-CM | POA: Diagnosis present

## 2015-09-14 DIAGNOSIS — D631 Anemia in chronic kidney disease: Secondary | ICD-10-CM | POA: Diagnosis present

## 2015-09-14 DIAGNOSIS — G40901 Epilepsy, unspecified, not intractable, with status epilepticus: Secondary | ICD-10-CM | POA: Diagnosis present

## 2015-09-14 DIAGNOSIS — Z992 Dependence on renal dialysis: Secondary | ICD-10-CM | POA: Diagnosis not present

## 2015-09-14 DIAGNOSIS — R4781 Slurred speech: Secondary | ICD-10-CM | POA: Diagnosis not present

## 2015-09-14 DIAGNOSIS — N2581 Secondary hyperparathyroidism of renal origin: Secondary | ICD-10-CM | POA: Diagnosis not present

## 2015-09-14 DIAGNOSIS — R05 Cough: Secondary | ICD-10-CM | POA: Diagnosis not present

## 2015-09-14 DIAGNOSIS — F79 Unspecified intellectual disabilities: Secondary | ICD-10-CM | POA: Diagnosis present

## 2015-09-14 DIAGNOSIS — E785 Hyperlipidemia, unspecified: Secondary | ICD-10-CM | POA: Diagnosis present

## 2015-09-14 DIAGNOSIS — F329 Major depressive disorder, single episode, unspecified: Secondary | ICD-10-CM | POA: Diagnosis present

## 2015-09-14 DIAGNOSIS — R569 Unspecified convulsions: Secondary | ICD-10-CM | POA: Diagnosis not present

## 2015-09-14 DIAGNOSIS — G40909 Epilepsy, unspecified, not intractable, without status epilepticus: Secondary | ICD-10-CM | POA: Diagnosis not present

## 2015-09-14 DIAGNOSIS — R112 Nausea with vomiting, unspecified: Secondary | ICD-10-CM | POA: Diagnosis not present

## 2015-09-14 DIAGNOSIS — G809 Cerebral palsy, unspecified: Secondary | ICD-10-CM | POA: Diagnosis not present

## 2015-09-14 LAB — URINALYSIS, ROUTINE W REFLEX MICROSCOPIC
BILIRUBIN URINE: NEGATIVE
Glucose, UA: NEGATIVE mg/dL
Ketones, ur: NEGATIVE mg/dL
Leukocytes, UA: NEGATIVE
Nitrite: NEGATIVE
Protein, ur: 30 mg/dL — AB
SPECIFIC GRAVITY, URINE: 1.01 (ref 1.005–1.030)
pH: 7.5 (ref 5.0–8.0)

## 2015-09-14 LAB — I-STAT CHEM 8, ED
BUN: 48 mg/dL — ABNORMAL HIGH (ref 6–20)
Calcium, Ion: 1.07 mmol/L — ABNORMAL LOW (ref 1.12–1.23)
Chloride: 93 mmol/L — ABNORMAL LOW (ref 101–111)
Creatinine, Ser: 9.3 mg/dL — ABNORMAL HIGH (ref 0.44–1.00)
GLUCOSE: 119 mg/dL — AB (ref 65–99)
HCT: 36 % (ref 36.0–46.0)
HEMOGLOBIN: 12.2 g/dL (ref 12.0–15.0)
POTASSIUM: 3.1 mmol/L — AB (ref 3.5–5.1)
Sodium: 135 mmol/L (ref 135–145)
TCO2: 23 mmol/L (ref 0–100)

## 2015-09-14 LAB — PROTIME-INR
INR: 1.22 (ref 0.00–1.49)
Prothrombin Time: 15.6 seconds — ABNORMAL HIGH (ref 11.6–15.2)

## 2015-09-14 LAB — URINE MICROSCOPIC-ADD ON

## 2015-09-14 LAB — COMPREHENSIVE METABOLIC PANEL
ALT: 9 U/L — AB (ref 14–54)
AST: 24 U/L (ref 15–41)
Albumin: 3.6 g/dL (ref 3.5–5.0)
Alkaline Phosphatase: 172 U/L — ABNORMAL HIGH (ref 38–126)
Anion gap: 25 — ABNORMAL HIGH (ref 5–15)
BUN: 54 mg/dL — ABNORMAL HIGH (ref 6–20)
CHLORIDE: 91 mmol/L — AB (ref 101–111)
CO2: 22 mmol/L (ref 22–32)
CREATININE: 10.08 mg/dL — AB (ref 0.44–1.00)
Calcium: 9.7 mg/dL (ref 8.9–10.3)
GFR, EST AFRICAN AMERICAN: 5 mL/min — AB (ref >60)
GFR, EST NON AFRICAN AMERICAN: 4 mL/min — AB (ref >60)
Glucose, Bld: 123 mg/dL — ABNORMAL HIGH (ref 65–99)
Potassium: 3.2 mmol/L — ABNORMAL LOW (ref 3.5–5.1)
Sodium: 138 mmol/L (ref 135–145)
TOTAL PROTEIN: 7.3 g/dL (ref 6.5–8.1)
Total Bilirubin: 0.4 mg/dL (ref 0.3–1.2)

## 2015-09-14 LAB — DIFFERENTIAL
Basophils Absolute: 0 10*3/uL (ref 0.0–0.1)
Basophils Relative: 0 %
EOS ABS: 0 10*3/uL (ref 0.0–0.7)
Eosinophils Relative: 0 %
Lymphocytes Relative: 17 %
Lymphs Abs: 2.1 10*3/uL (ref 0.7–4.0)
MONO ABS: 0.6 10*3/uL (ref 0.1–1.0)
MONOS PCT: 5 %
NEUTROS ABS: 9.6 10*3/uL — AB (ref 1.7–7.7)
Neutrophils Relative %: 78 %

## 2015-09-14 LAB — CBC
HEMATOCRIT: 32.7 % — AB (ref 36.0–46.0)
Hemoglobin: 10.4 g/dL — ABNORMAL LOW (ref 12.0–15.0)
MCH: 31.1 pg (ref 26.0–34.0)
MCHC: 31.8 g/dL (ref 30.0–36.0)
MCV: 97.9 fL (ref 78.0–100.0)
Platelets: 421 10*3/uL — ABNORMAL HIGH (ref 150–400)
RBC: 3.34 MIL/uL — ABNORMAL LOW (ref 3.87–5.11)
RDW: 15 % (ref 11.5–15.5)
WBC: 12.4 10*3/uL — ABNORMAL HIGH (ref 4.0–10.5)

## 2015-09-14 LAB — CBG MONITORING, ED: Glucose-Capillary: 118 mg/dL — ABNORMAL HIGH (ref 65–99)

## 2015-09-14 LAB — TSH: TSH: 1.138 u[IU]/mL (ref 0.350–4.500)

## 2015-09-14 LAB — I-STAT TROPONIN, ED: TROPONIN I, POC: 0.01 ng/mL (ref 0.00–0.08)

## 2015-09-14 LAB — AMMONIA: Ammonia: 65 umol/L — ABNORMAL HIGH (ref 9–35)

## 2015-09-14 LAB — APTT: APTT: 27 s (ref 24–37)

## 2015-09-14 MED ORDER — LACOSAMIDE 50 MG PO TABS
50.0000 mg | ORAL_TABLET | Freq: Two times a day (BID) | ORAL | Status: DC
Start: 2015-09-15 — End: 2015-09-17
  Administered 2015-09-15 – 2015-09-17 (×5): 50 mg via ORAL
  Filled 2015-09-14 (×5): qty 1

## 2015-09-14 MED ORDER — LEVOTHYROXINE SODIUM 100 MCG IV SOLR
25.0000 ug | Freq: Every day | INTRAVENOUS | Status: DC
  Administered 2015-09-14: 25 ug via INTRAVENOUS
  Filled 2015-09-14 (×2): qty 5

## 2015-09-14 MED ORDER — HALOPERIDOL LACTATE 5 MG/ML IJ SOLN: 2.0000 mg | Freq: Four times a day (QID) | INTRAMUSCULAR | Status: DC | PRN

## 2015-09-14 MED ORDER — SODIUM CHLORIDE 0.9 % IV SOLN
500.0000 mg | Freq: Two times a day (BID) | INTRAVENOUS | Status: DC
  Administered 2015-09-15: 500 mg via INTRAVENOUS
  Filled 2015-09-14 (×3): qty 5

## 2015-09-14 MED ORDER — SODIUM CHLORIDE 0.9 % IV SOLN
100.0000 mg | Freq: Once | INTRAVENOUS | Status: AC
  Administered 2015-09-14: 100 mg via INTRAVENOUS
  Filled 2015-09-14: qty 10

## 2015-09-14 MED ORDER — LORAZEPAM 2 MG/ML IJ SOLN
1.0000 mg | Freq: Once | INTRAMUSCULAR | Status: AC
  Administered 2015-09-15: 1 mg via INTRAVENOUS
  Filled 2015-09-14: qty 1

## 2015-09-14 MED ORDER — ONDANSETRON HCL 4 MG/2ML IJ SOLN
4.0000 mg | Freq: Once | INTRAMUSCULAR | Status: AC
  Administered 2015-09-14: 4 mg via INTRAVENOUS
  Filled 2015-09-14: qty 2

## 2015-09-14 MED ORDER — LACOSAMIDE 50 MG PO TABS: 100.0000 mg | ORAL_TABLET | Freq: Two times a day (BID) | ORAL | Status: DC

## 2015-09-14 MED ORDER — LORAZEPAM 2 MG/ML IJ SOLN: 2.0000 mg | INTRAMUSCULAR | Status: DC | PRN

## 2015-09-14 MED ORDER — HEPARIN SODIUM (PORCINE) 5000 UNIT/ML IJ SOLN
5000.0000 [IU] | Freq: Three times a day (TID) | INTRAMUSCULAR | Status: DC
  Administered 2015-09-14 – 2015-09-17 (×6): 5000 [IU] via SUBCUTANEOUS
  Filled 2015-09-14 (×3): qty 1

## 2015-09-14 MED ORDER — LORAZEPAM 2 MG/ML IJ SOLN
1.0000 mg | Freq: Once | INTRAMUSCULAR | Status: AC
  Administered 2015-09-14: 1 mg via INTRAVENOUS
  Filled 2015-09-14: qty 1

## 2015-09-14 NOTE — ED Notes (Signed)
RN attempted report to Lou Miner MD notified of patients placement- states he does not want pt to go to renal floor but instead a neuro floor; patient placement notified

## 2015-09-14 NOTE — ED Notes (Signed)
Called [facility]M for report; informed that neither admitting RN nor charge RN were available for report.

## 2015-09-14 NOTE — ED Provider Notes (Signed)
CSN: [id number]     Arrival date & time 09/14/15  1346 History   First MD Initiated Contact with Patient 09/14/15 1502     Chief Complaint  Patient presents with  . Altered Mental Status  . Seizures     (Consider location/radiation/quality/duration/timing/severity/associated sxs/prior Treatment) HPI  Patient is a 49 year old female with past medical history significant for ESRD on peritoneal dialysis, hypothyroidism, schizophrenia, HTN, who presents to the emergency department with altered mental status and seizures. Patient is accompanied by her brother-in-law who gives the history. States she has had 2 days of "not quite acting herself", states she will stare off into space and not follow commands. Acting more tired and lethargic than baseline. At 1:30 PM witnessed seizure like activity, reports head deviation to the right with left arm and leg with rhythmic shaking. Lasted approximately 2-3 minutes. Did not fully return to baseline. When EMS arrived, another witnessed seizure-like activity similar to the first. Did not receive any medications. No postictal period. No tongue biting, urinary incontinence, bowel incontinence. Reports a 2 day history of vomiting with nausea. Denies fevers, chills, headache, change in vision, chest pain, shortness of breath, abdominal pain. States she has complained of dysuria over the past couple of days, was given over-the-counter UTI medications but no antibiotics.  Denies recent falls or head trauma. Missed one round of peritoneal dialysis, does dialysis 3 times a day.   Past Medical History  Diagnosis Date  . Renal disease   . Hypertension   . Anemia   . Hyperlipemia   . Moderately mentally retarded   . Bipolar disorder ([place])   . Depression   . Hypothyroidism   . Seizures ([place])     No recent seizures - no meds  . Schizophrenia ([place])   . ESRD on peritoneal dialysis [facility])    Past Surgical History  Procedure Laterality Date  . Breast reduction  surgery    . Foot surgery      right  . Examination under anesthesia N/A 12/08/2013    Procedure: EXAM UNDER ANESTHESIA;  Surgeon: Lavonia Drafts, MD;  Location: [facility];  Service: Gynecology;  Laterality: N/A;  Pelvic exam and pap smear, unable to tolerate during last office visit  . Breast surgery      breast reduction   History reviewed. No pertinent family history. Social History  Substance Use Topics  . Smoking status: Never Smoker   . Smokeless tobacco: Never Used  . Alcohol Use: No   OB History    Gravida Para Term Preterm AB TAB SAB Ectopic Multiple Living   0 0 0 0 0 0 0 0 0 0      Review of Systems  Unable to perform ROS: Mental status change  Constitutional: Negative for fever.  Respiratory: Positive for cough. Negative for chest tightness and shortness of breath.   Gastrointestinal: Positive for nausea and vomiting. Negative for abdominal pain.  Genitourinary: Positive for dysuria. Negative for hematuria and flank pain.  Skin: Negative for wound.  Neurological: Positive for seizures. Negative for facial asymmetry and weakness.  Psychiatric/Behavioral: Positive for confusion and decreased concentration. Negative for hallucinations.      Allergies  Review of patient's allergies indicates no known allergies.  Home Medications   Prior to Admission medications   Medication Sig Start Date End Date Taking? Authorizing Provider  allopurinol (ZYLOPRIM) 300 MG tablet Take 300 mg by mouth daily.   Yes Historical Provider, MD  cinacalcet (SENSIPAR) 30 MG tablet Take 30 mg by mouth  daily.   Yes Historical Provider, MD  cyanocobalamin 500 MCG tablet Take 500 mcg by mouth daily.   Yes Historical Provider, MD  levothyroxine (SYNTHROID, LEVOTHROID) 50 MCG tablet Take 50 mcg by mouth daily.   Yes Historical Provider, MD  potassium chloride (KLOR-CON) 20 MEQ packet Take 20 mEq by mouth daily.    Yes Historical Provider, MD  risperiDONE (RISPERDAL) 0.5 MG tablet Take 0.5 mg  by mouth 2 (two) times daily as needed. As needed for agitation   Yes Historical Provider, MD  risperiDONE (RISPERDAL) 3 MG tablet Take 3 mg by mouth at bedtime.   Yes Historical Provider, MD  sevelamer carbonate (RENVELA) 800 MG tablet Take 800 mg by mouth 3 (three) times daily with meals.   Yes Historical Provider, MD  vitamin B-12 (CYANOCOBALAMIN) 500 MCG tablet Take 500 mcg by mouth daily.   Yes Historical Provider, MD  ferrous sulfate 325 (65 FE) MG EC tablet Take 325 mg by mouth 2 (two) times daily.     Historical Provider, MD  medroxyPROGESTERone (DEPO-PROVERA) 150 MG/ML injection Inject 150 mg into the muscle every 3 (three) months.     Historical Provider, MD   BP 110/66 mmHg  Pulse 89  Temp(Src) 97.8 F (36.6 C) (Oral)  Resp 16  SpO2 93%  LMP  Physical Exam  Constitutional: She appears well-developed and well-nourished. No distress.  HENT:  Head: Atraumatic.  Right Ear: External ear normal.  Left Ear: External ear normal.  Mouth/Throat: Oropharynx is clear and moist.  Eyes: Conjunctivae and EOM are normal. Pupils are equal, round, and reactive to light.  Neck: Normal range of motion. Neck supple. No JVD present. No tracheal deviation present.  Cardiovascular: Normal rate, regular rhythm, normal heart sounds and intact distal pulses.   Pulmonary/Chest: Effort normal and breath sounds normal. No respiratory distress. She has no wheezes.  Abdominal: Soft. Bowel sounds are normal. She exhibits no distension. There is no tenderness. There is no rebound and no guarding.  Musculoskeletal: Normal range of motion.  Neurological: She is alert. She has normal strength and normal reflexes. She is disoriented. No cranial nerve deficit or sensory deficit. She displays no Babinski's sign on the right side. She displays no Babinski's sign on the left side.  Alert and responds to some questions appropriately. Not oriented to place or time. Intermittently follows commands. Difficult to understand  her speech (family states this is normal for her occasionally), moving all extremities equally. Normal tone. No dysmetria.  Skin: Skin is warm. No pallor.  Psychiatric: She has a normal mood and affect.    ED Course  Procedures (including critical care time) Labs Review Labs Reviewed  PROTIME-INR - Abnormal; Notable for the following:    Prothrombin Time 15.6 (*)    All other components within normal limits  CBC - Abnormal; Notable for the following:    WBC 12.4 (*)    RBC 3.34 (*)    Hemoglobin 10.4 (*)    HCT 32.7 (*)    Platelets 421 (*)    All other components within normal limits  DIFFERENTIAL - Abnormal; Notable for the following:    Neutro Abs 9.6 (*)    All other components within normal limits  COMPREHENSIVE METABOLIC PANEL - Abnormal; Notable for the following:    Potassium 3.2 (*)    Chloride 91 (*)    Glucose, Bld 123 (*)    BUN 54 (*)    Creatinine, Ser 10.08 (*)    ALT 9 (*)  Alkaline Phosphatase 172 (*)    GFR calc non Af Amer 4 (*)    GFR calc Af Amer 5 (*)    Anion gap 25 (*)    All other components within normal limits  AMMONIA - Abnormal; Notable for the following:    Ammonia 65 (*)    All other components within normal limits  URINALYSIS, ROUTINE W REFLEX MICROSCOPIC (NOT AT [facility]) - Abnormal; Notable for the following:    Hgb urine dipstick TRACE (*)    Protein, ur 30 (*)    All other components within normal limits  URINE MICROSCOPIC-ADD ON - Abnormal; Notable for the following:    Squamous Epithelial / LPF 0-5 (*)    Bacteria, UA FEW (*)    All other components within normal limits  I-STAT CHEM 8, ED - Abnormal; Notable for the following:    Potassium 3.1 (*)    Chloride 93 (*)    BUN 48 (*)    Creatinine, Ser 9.30 (*)    Glucose, Bld 119 (*)    Calcium, Ion 1.07 (*)    All other components within normal limits  CBG MONITORING, ED - Abnormal; Notable for the following:    Glucose-Capillary 118 (*)    All other components within normal  limits  APTT  TSH  HEMOGLOBIN A1C  LIPID PANEL  I-STAT TROPOININ, ED    Imaging Review Ct Head Wo Contrast  09/14/2015  CLINICAL DATA:  Slurred speech EXAM: CT HEAD WITHOUT CONTRAST TECHNIQUE: Contiguous axial images were obtained from the base of the skull through the vertex without intravenous contrast. COMPARISON:  July 14, 2012 FINDINGS: The ventricles are normal in size and configuration. There is no intracranial mass hemorrhage, extra-axial fluid collection, or midline shift. No focal gray-white compartment lesions are identified. No acute infarct evident. The bony calvarium appears intact. The mastoid air cells are clear. There is leftward deviation of the nasal septum. IMPRESSION: No intracranial mass or hemorrhage. Gray-white compartments appear normal. No acute infarct evident. Electronically Signed   By: Lowella Grip III M.D.   On: 09/14/2015 16:01   Dg Chest Portable 1 View  09/14/2015  CLINICAL DATA:  Patient altered mental status.  Seizure. EXAM: PORTABLE CHEST 1 VIEW COMPARISON:  Chest radiograph 07/14/2012. FINDINGS: Stable cardiac and mediastinal contours. No consolidative pulmonary opacities. No pleural effusion or pneumothorax. IMPRESSION: No acute cardiopulmonary process. Electronically Signed   By: Lovey Newcomer M.D.   On: 09/14/2015 18:08   I have personally reviewed and evaluated these images and lab results as part of my medical decision-making.   EKG Interpretation None      MDM   Final diagnoses:  Seizure [facility])   Patient is a 49 year old female with a past medical history significant for CKD (peritoneal dialysis), schizophrenia, HTN, history of seizure disorder (not currently on antiepileptic medication), who presents to the emergency department for altered mental status and seizures. 2 witnessed seizures prior to arrival, one seizure witnessed by EMS, reported grand mal. Brother-in-law reports partial seizure. On arrival, not actively seizing, not back  to baseline. Afebrile, hemodynamically stable. AAOx1 (person), responds to some questions appropriately. Exam as above, notable for lungs CTAP, RRR, benign abdominal exam, no obvious neuro deficits. Glucose 120  Remote history of seizure disorder, not currently on any antiepileptic medication, last seizure in her teens according to family. Last hospitalized in 2013 for hepatic encephalopathy, valproic acid discontinued at that time (mood stabilizer).   Differential diagnosis includes, seizure disorder (valproic acid for  mood stabilizer, d/c 2 years ago) possible decreased threshold d/t infectious process, hepatic encephalopathy, metabolic encephalopathy, intracranial abnormality (hemorrhage, infarction, malignancy).   WBC 12.4. No significant electrolyte abnormalities. UA showed no signs of infection. CXR showed no signs of pneumonia. CT head showed no acute findings. Ammonia 65. TSH 1.13.   Given patient's remote history of seizure disorder, neurology was consult stated. Saw the patient in the emergency department, recommended admission with EEG and MRI in the morning.  Patient admitted to medicine. Stable for the floor. Transferred to floor in stable condition.      Nathaniel Man, MD 09/15/15 [id number]  Leonard Schwartz, MD 09/15/15 [phone]

## 2015-09-14 NOTE — ED Notes (Signed)
While in the room with patient, pt had witnessed 1 minute grand mal seizure. MD Audie Pinto notified as well as Resident Mumma -ordered to give 1 mg ativan injection. Pt post ictal at this time.

## 2015-09-14 NOTE — ED Notes (Signed)
Checked patient cbg it was 118 notified RN of blood sugar 

## 2015-09-14 NOTE — H&P (Addendum)
[facility]                                                                                    Patient Demographics  Jane [name], is a 49 y.o. female  CSN: [id number]  MRN: [id number]  DOB - 06/23/1966  Admit Date - 09/14/2015  Outpatient Primary MD for the patient is [name],GEORGE, MD   With History of -  Past Medical History  Diagnosis Date  . Renal disease   . Hypertension   . Anemia   . Hyperlipemia   . Moderately mentally retarded   . Bipolar disorder ([place])   . Depression   . Hypothyroidism   . Seizures ([place])     No recent seizures - no meds  . Schizophrenia ([place])   . ESRD on peritoneal dialysis [facility])       Past Surgical History  Procedure Laterality Date  . Breast reduction surgery    . Foot surgery      right  . Examination under anesthesia N/A 12/08/2013    Procedure: EXAM UNDER ANESTHESIA;  Surgeon: Lavonia Drafts, MD;  Location: [facility];  Service: Gynecology;  Laterality: N/A;  Pelvic exam and pap smear, unable to tolerate during last office visit  . Breast surgery      breast reduction    in for   Chief Complaint  Patient presents with  . Altered Mental Status  . Seizures     HPI  Jane [name]  is a 49 y.o. female, with past medical history significant for end-stage renal disease on PD, cerebral palsy, schizophrenia and hypothyroidism who was brought by EMS because of witnessed seizure-like activity 2 at home. Patient lives with her family who reported to the emergency room physician that she had a history of seizures and since a teenager and was on Depakote which was DC'd due to to liver problems. Patient has been seizure free for the last few years until this happened. When EMS arrived home the patient had another seizure with no incontinence or tongue biting but there was left-sided rhythmic movements. According to the ER note the patient denied any fever chills headache or chest pains, but when I arrived the  patient was basically unresponsive staring onto the ceiling and I could not elicit any response. The family was not in the room.    Review of Systems    Unable to obtain  Social History Social History  Substance Use Topics  . Smoking status: Never Smoker   . Smokeless tobacco: Never Used  . Alcohol Use: No     Family History Unable to obtain  Prior to Admission medications   Medication Sig Start Date End Date Taking? Authorizing Provider  allopurinol (ZYLOPRIM) 300 MG tablet Take 300 mg by mouth daily.   Yes Historical Provider, MD  cinacalcet (SENSIPAR) 30 MG tablet Take 30 mg by mouth daily.   Yes Historical Provider, MD  cyanocobalamin 500 MCG tablet Take 500 mcg by mouth daily.   Yes Historical Provider, MD  levothyroxine (SYNTHROID, LEVOTHROID) 50 MCG tablet Take 50 mcg by mouth daily.   Yes Historical Provider, MD  potassium chloride (KLOR-CON) 20 MEQ packet Take  20 mEq by mouth daily.    Yes Historical Provider, MD  risperiDONE (RISPERDAL) 0.5 MG tablet Take 0.5 mg by mouth 2 (two) times daily as needed. As needed for agitation   Yes Historical Provider, MD  risperiDONE (RISPERDAL) 3 MG tablet Take 3 mg by mouth at bedtime.   Yes Historical Provider, MD  sevelamer carbonate (RENVELA) 800 MG tablet Take 800 mg by mouth 3 (three) times daily with meals.   Yes Historical Provider, MD  vitamin B-12 (CYANOCOBALAMIN) 500 MCG tablet Take 500 mcg by mouth daily.   Yes Historical Provider, MD  ferrous sulfate 325 (65 FE) MG EC tablet Take 325 mg by mouth 2 (two) times daily.     Historical Provider, MD  medroxyPROGESTERone (DEPO-PROVERA) 150 MG/ML injection Inject 150 mg into the muscle every 3 (three) months.     Historical Provider, MD    No Known Allergies  Physical Exam  Vitals  Blood pressure 144/94, pulse 103, temperature 98.2 F (36.8 C), temperature source Oral, resp. rate 24, SpO2 97 %.   1. General well-nourished, female   2. Obtunded, staring at the  ceiling.  3. Unresponsive, except to painful stimuli.  4. Exophthalmia, Conjunctivae clear, PERRLA. Moist Oral Mucosa , no facial deviation  5.  No JVD, No cervical lymphadenopathy appriciated, No Carotid Bruits.  6. Symmetrical Chest wall movement, poor inspiratory efforts.  7. RRR, No Gallops, Rubs or Murmurs, No Parasternal Heave.  8. Positive Bowel Sounds, Abdomen Soft, Non tender, No organomegaly appriciated,No rebound -guarding or rigidity.  9.  No Cyanosis, Normal Skin Turgor, No Skin Rash or Bruise.  10. Good muscle tone,  joints appear normal , no effusions, Normal ROM.    Data Review  CBC  Recent Labs Lab 09/14/15 1539 09/14/15 1546  WBC 12.4*  --   HGB 10.4* 12.2  HCT 32.7* 36.0  PLT 421*  --   MCV 97.9  --   MCH 31.1  --   MCHC 31.8  --   RDW 15.0  --   LYMPHSABS 2.1  --   MONOABS 0.6  --   EOSABS 0.0  --   BASOSABS 0.0  --    ------------------------------------------------------------------------------------------------------------------  Chemistries   Recent Labs Lab 09/14/15 1539 09/14/15 1546  NA 138 135  K 3.2* 3.1*  CL 91* 93*  CO2 22  --   GLUCOSE 123* 119*  BUN 54* 48*  CREATININE 10.08* 9.30*  CALCIUM 9.7  --   AST 24  --   ALT 9*  --   ALKPHOS 172*  --   BILITOT 0.4  --    ------------------------------------------------------------------------------------------------------------------ CrCl cannot be calculated (Unknown ideal weight.). ------------------------------------------------------------------------------------------------------------------  Recent Labs  09/14/15 1553  TSH 1.138     Coagulation profile  Recent Labs Lab 09/14/15 1539  INR 1.22   ------------------------------------------------------------------------------------------------------------------- No results for input(s): DDIMER in the last 72  hours. -------------------------------------------------------------------------------------------------------------------  Cardiac Enzymes No results for input(s): CKMB, TROPONINI, MYOGLOBIN in the last 168 hours.  Invalid input(s): CK ------------------------------------------------------------------------------------------------------------------ Invalid input(s): POCBNP   ---------------------------------------------------------------------------------------------------------------  Urinalysis    Component Value Date/Time   COLORURINE YELLOW 09/14/2015 1714   APPEARANCEUR CLEAR 09/14/2015 1714   LABSPEC 1.010 09/14/2015 1714   PHURINE 7.5 09/14/2015 1714   GLUCOSEU NEGATIVE 09/14/2015 1714   HGBUR TRACE* 09/14/2015 1714   HGBUR trace-intact 04/12/2010 0904   BILIRUBINUR NEGATIVE 09/14/2015 [place] 09/14/2015 1714   PROTEINUR 30* 09/14/2015 1714   UROBILINOGEN 1.0 07/14/2012 2005   NITRITE  NEGATIVE 09/14/2015 1714   LEUKOCYTESUR NEGATIVE 09/14/2015 1714    ----------------------------------------------------------------------------------------------------------------   Imaging results:   Ct Head Wo Contrast  09/14/2015  CLINICAL DATA:  Slurred speech EXAM: CT HEAD WITHOUT CONTRAST TECHNIQUE: Contiguous axial images were obtained from the base of the skull through the vertex without intravenous contrast. COMPARISON:  July 14, 2012 FINDINGS: The ventricles are normal in size and configuration. There is no intracranial mass hemorrhage, extra-axial fluid collection, or midline shift. No focal gray-white compartment lesions are identified. No acute infarct evident. The bony calvarium appears intact. The mastoid air cells are clear. There is leftward deviation of the nasal septum. IMPRESSION: No intracranial mass or hemorrhage. Gray-white compartments appear normal. No acute infarct evident. Electronically Signed   By: Lowella Grip III M.D.   On:  09/14/2015 16:01   Dg Chest Portable 1 View  09/14/2015  CLINICAL DATA:  Patient altered mental status.  Seizure. EXAM: PORTABLE CHEST 1 VIEW COMPARISON:  Chest radiograph 07/14/2012. FINDINGS: Stable cardiac and mediastinal contours. No consolidative pulmonary opacities. No pleural effusion or pneumothorax. IMPRESSION: No acute cardiopulmonary process. Electronically Signed   By: Lovey Newcomer M.D.   On: 09/14/2015 18:08      Assessment & Plan  1. Seizure activity , with ?status epilepticus , patient has history of seizures as teenager. She has been seizure-free since her teens     IV Keppra     Neurology called     EEG     MRI of the brain     Neurochecks  2. End-stage renal disease on peritoneal dialysis     Consult nephrology  3. History of cerebral palsy, schizophrenia and bipolar disorder     Holding all by mouth medications now     IM Haldol when necessary    DVT Prophylaxis Heparin  AM Labs Ordered, also please review Full Orders  Code Status full  Disposition Plan: Unknown  Time spent in minutes : 42 minutes  Condition critical   Addendum : Patient seen later at around 7:10 PM , she was more alert and awake, still confused . Not sure if patient is having subclinical seizures. Reviewed neurology note later on . Nephrology will follow in a.m.   @SIGNATURE @

## 2015-09-14 NOTE — ED Notes (Signed)
Family member at bedside at this time - per family member - EMS was called today with concern of a possible stroke. Pt is normally able to be understood without difficulty and speaks in full, complete sentences. Pt speech is slurred at current time, very difficult to understand. LSW yesterday.

## 2015-09-14 NOTE — Consult Note (Addendum)
Neurology Consultation Reason for Consult: Seizures Referring Physician: Hijazi, A  CC: Seizures  History is obtained from: Patient  HPI: Jane [name] is a 49 y.o. female presents with 3 seizures today. Per her sister, she noticed her trying to the right looking to the right and having twitching activity. She then subsequently had 2 additional seizures, with the "worst being here in the emergency room."  She didn't have prolonged return to baseline, though now she seems to be improving.  She has a history of seizure per chart, but has not had one in quite some time. 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   She has a significantly higher creatinine than her last and has an elevated BUN as well.   Family also notes that she has been having nausea and vomiting, suspected gastroenteritis or the past 2 days.  ROS: Unable to obtain due to altered mental status.   Past Medical History  Diagnosis Date  . Renal disease   . Hypertension   . Anemia   . Hyperlipemia   . Moderately mentally retarded   . Bipolar disorder ([place])   . Depression   . Hypothyroidism   . Seizures ([place])     No recent seizures - no meds  . Schizophrenia ([place])   . ESRD on peritoneal dialysis [facility])      History reviewed. No pertinent family history.   Social History:  reports that she has never smoked. She has never used smokeless tobacco. She reports that she does not drink alcohol or use illicit drugs.   Exam: Current vital signs: BP 144/94 mmHg  Pulse 103  Temp(Src) 98.2 F (36.8 C) (Oral)  Resp 24  SpO2 97%  LMP  Vital signs in last 24 hours: Temp:  [98.2 F (36.8 C)] 98.2 F (36.8 C) (11/22 1410) Pulse Rate:  [87-103] 103 (11/22 1830) Resp:  [16-24] 24 (11/22 1830) BP: (119-144)/(66-94) 144/94 mmHg (11/22 1830) SpO2:  [91 %-100 %] 97 % (11/22 1830)   Physical Exam   Constitutional: Appears well-developed and well-nourished.  Psych: Affect appropriate to situation Eyes: No scleral injection HENT: No OP obstrucion Head: Normocephalic.  Cardiovascular: Normal rate and regular rhythm.  Respiratory: Effort normal and breath sounds normal to anterior ascultation GI: Soft.  No distension. There is no tenderness.  Skin: WDI  Neuro: Mental Status: Patient is awake, alert, able to tell me her name. She is able to comply with commands, but does not agree to perform all activities I asked of her. She has moderate mental retardation. She repeatedly asks her father for "chips and salsa" No signs of aphasia or neglect Cranial Nerves: II: Blinks to threat bilaterally Pupils are equal, round, and reactive to light.   III,IV, VI: EOMI without ptosis or diploplia.  V: Facial sensation is symmetric to temperature VII: Facial movement is symmetric.  VIII: hearing is intact to voice X: Uvula elevates symmetrically XI: Shoulder shrug is symmetric. XII: tongue is midline without atrophy or fasciculations.  Motor: She does not comply with formal strength testing, but appears to move all extremities equally Sensory: She endorses sensation bilaterally Deep Tendon Reflexes: 2+ and symmetric in the biceps and patellae.  Cerebellar: Patient does not comply with testing   I have reviewed labs in epic and the results pertinent to this consultation are: Creatinine 10 BUN 54  I have reviewed the images obtained: CT head-  no acute findings  Impression: 49 year old female with a remote history of seizures who presents with new partial seizures. Given how remote her seizure history is, further workup with MRI/EEG may be reasonable, but I suspect that she has an underlying seizure predisposition and her seizure threshold was lowered due to gastroenteritis. Given that she was on depakote for many years for mood, this could explain why her seizure history is so remote.    Recommendations: 1) MRI brain if patient can cooperate. , though if unable to obtain due to patient cooperation would not progress to concious sedation/general anesthesia.  2) EEG 3) lacosamide 50mg  BID x 1 week, then 100mg  BID.    Roland Rack, MD [facility] [phone]  If 7pm- 7am, please page neurology on call as listed in [place].

## 2015-09-14 NOTE — ED Notes (Addendum)
Pt to ER via [facility] from home where she lives with family - pt is a dialysis patient who requires dialysis 3 times a day (peritoneal dialysis). Last dialysis was today. Pt family called out today with complaint that pt had abnormal speech. Also reported that patient had not been speaking for 2 days. Pt has hx of schizophrenia. On EMS arrival, fire reported that pt had a witnessed seizure that lasted 2-3 minutes. On arrival to ER pt able to follow commands and speech is slurred. Not sure what patient speech is at baseline. EMS reports family to be poor historians but they did say she had never had a seizure. CBG 121. Pt is alert to self.

## 2015-09-14 NOTE — ED Notes (Signed)
Attempted to call report to [phone]; unsuccessful.

## 2015-09-15 ENCOUNTER — Inpatient Hospital Stay (HOSPITAL_COMMUNITY)
Admit: 2015-09-15 | Discharge: 2015-09-15 | Disposition: A | Discharge status: Home / Self Care | Payer: Medicare Other | Attending: Internal Medicine | Admitting: Internal Medicine

## 2015-09-15 ENCOUNTER — Inpatient Hospital Stay (HOSPITAL_COMMUNITY): Payer: Medicare Other

## 2015-09-15 DIAGNOSIS — Z992 Dependence on renal dialysis: Secondary | ICD-10-CM | POA: Insufficient documentation

## 2015-09-15 DIAGNOSIS — R569 Unspecified convulsions: Secondary | ICD-10-CM | POA: Insufficient documentation

## 2015-09-15 DIAGNOSIS — N186 End stage renal disease: Secondary | ICD-10-CM | POA: Insufficient documentation

## 2015-09-15 LAB — LIPID PANEL
Cholesterol: 155 mg/dL (ref 0–200)
HDL: 33 mg/dL — ABNORMAL LOW (ref >40)
LDL Cholesterol: 102 mg/dL — ABNORMAL HIGH (ref 0–99)
Total CHOL/HDL Ratio: 4.7 RATIO
Triglycerides: 98 mg/dL (ref <150)
VLDL: 20 mg/dL (ref 0–40)

## 2015-09-15 MED ORDER — ALLOPURINOL 300 MG PO TABS
300.0000 mg | ORAL_TABLET | Freq: Every day | ORAL | Status: DC
  Administered 2015-09-15 – 2015-09-17 (×3): 300 mg via ORAL
  Filled 2015-09-15 (×2): qty 1
  Filled 2015-09-15: qty 3

## 2015-09-15 MED ORDER — POTASSIUM CHLORIDE ER 10 MEQ PO TBCR
20.0000 meq | EXTENDED_RELEASE_TABLET | Freq: Every day | ORAL | Status: DC
  Administered 2015-09-15 – 2015-09-17 (×3): 20 meq via ORAL
  Filled 2015-09-15 (×4): qty 2

## 2015-09-15 MED ORDER — RISPERIDONE 3 MG PO TABS
3.0000 mg | ORAL_TABLET | Freq: Every day | ORAL | Status: DC
  Administered 2015-09-15 – 2015-09-16 (×2): 3 mg via ORAL
  Filled 2015-09-15 (×4): qty 1

## 2015-09-15 MED ORDER — CINACALCET HCL 30 MG PO TABS
30.0000 mg | ORAL_TABLET | Freq: Every day | ORAL | Status: DC
  Administered 2015-09-15 – 2015-09-17 (×3): 30 mg via ORAL
  Filled 2015-09-15 (×4): qty 1

## 2015-09-15 MED ORDER — LEVETIRACETAM 500 MG PO TABS
500.0000 mg | ORAL_TABLET | Freq: Two times a day (BID) | ORAL | Status: DC
  Administered 2015-09-15 – 2015-09-17 (×5): 500 mg via ORAL
  Filled 2015-09-15 (×5): qty 1

## 2015-09-15 MED ORDER — SEVELAMER CARBONATE 800 MG PO TABS
800.0000 mg | ORAL_TABLET | Freq: Three times a day (TID) | ORAL | Status: DC
  Administered 2015-09-15 – 2015-09-16 (×4): 800 mg via ORAL
  Filled 2015-09-15 (×5): qty 1

## 2015-09-15 MED ORDER — LEVOTHYROXINE SODIUM 50 MCG PO TABS
50.0000 ug | ORAL_TABLET | Freq: Every day | ORAL | Status: DC
  Administered 2015-09-15 – 2015-09-17 (×3): 50 ug via ORAL
  Filled 2015-09-15 (×3): qty 1

## 2015-09-15 MED ORDER — RISPERIDONE 0.5 MG PO TABS
0.5000 mg | ORAL_TABLET | Freq: Two times a day (BID) | ORAL | Status: DC | PRN
  Administered 2015-09-17: 0.5 mg via ORAL
  Filled 2015-09-15 (×3): qty 1

## 2015-09-15 MED ORDER — FERROUS SULFATE 325 (65 FE) MG PO TABS
325.0000 mg | ORAL_TABLET | Freq: Two times a day (BID) | ORAL | Status: DC
  Administered 2015-09-15 – 2015-09-17 (×4): 325 mg via ORAL
  Filled 2015-09-15 (×5): qty 1

## 2015-09-15 MED ORDER — HEPARIN 1000 UNIT/ML FOR PERITONEAL DIALYSIS
1500.0000 [IU] | INTRAMUSCULAR | Status: DC | PRN
  Filled 2015-09-15: qty 1.5

## 2015-09-15 MED ORDER — ZOLPIDEM TARTRATE 5 MG PO TABS
5.0000 mg | ORAL_TABLET | Freq: Once | ORAL | Status: AC
  Administered 2015-09-15: 5 mg via ORAL
  Filled 2015-09-15: qty 1

## 2015-09-15 MED ORDER — RENA-VITE PO TABS
1.0000 | ORAL_TABLET | Freq: Every day | ORAL | Status: DC
  Administered 2015-09-15 – 2015-09-16 (×2): 1 via ORAL
  Filled 2015-09-15 (×2): qty 1

## 2015-09-15 MED ORDER — MEDROXYPROGESTERONE ACETATE 150 MG/ML IM SUSP: 150.0000 mg | INTRAMUSCULAR | Status: DC

## 2015-09-15 MED ORDER — DELFLEX-LC/1.5% DEXTROSE 346 MOSM/L IP SOLN
Freq: Three times a day (TID) | INTRAPERITONEAL | Status: DC
  Administered 2015-09-15 – 2015-09-16 (×4): via INTRAPERITONEAL

## 2015-09-15 NOTE — Progress Notes (Signed)
[facility] PROGRESS NOTE    Progress Note   SHERI KEPLAR [id number] DOB: 10-23-1966 DOA: 09/14/2015 PCP: [name], MD   Brief Narrative:   Jane [name] is an 49 y.o. female with past medical history of end-stage renal disease on peritoneal dialysis, cerebral palsy, schizophrenia and hypothyroidism is brought into the ED is unwitnessed seizure at home. As per family members which were in the room they relate that she was on Depakote which was DC'd due to deliver problems.  Assessment/Plan:   Active Problems:   Seizure ([place])  Start on IV Keppra neurology was consulted, CT of the head was done that showed no acute intracranial abnormalities, MRI of the brain done 09/15/2015 shows no acute infarcts, EEG was done which is pending at this time. She has been off her Depakote since 2013 due to her kidney failure. On admission she was loaded up with Keppra she hasn't had any seizures since. Allow diet,  change her Keppra to oral and cont vimpat.  End-stage renal disease on peritoneal dialysis: Renal was consulted, she dialyzes at [facility]. Resume her home medications.  History of cerebral palsy/schizophrenia and bipolar disorder: Resume all her home medications.    DVT Prophylaxis - Lovenox ordered.  Family Communication: sister Disposition Plan: Home in am Code Status:     Code Status Orders        Start     Ordered   09/14/15 2105  Full code   Continuous     09/14/15 2104        IV Access:    Peripheral IV   Procedures and diagnostic studies:   Ct Head Wo Contrast  09/14/2015  CLINICAL DATA:  Slurred speech EXAM: CT HEAD WITHOUT CONTRAST TECHNIQUE: Contiguous axial images were obtained from the base of the skull through the vertex without intravenous contrast. COMPARISON:  July 14, 2012 FINDINGS: The ventricles are normal in size and configuration. There is no intracranial mass hemorrhage, extra-axial fluid  collection, or midline shift. No focal gray-white compartment lesions are identified. No acute infarct evident. The bony calvarium appears intact. The mastoid air cells are clear. There is leftward deviation of the nasal septum. IMPRESSION: No intracranial mass or hemorrhage. Gray-white compartments appear normal. No acute infarct evident. Electronically Signed   By: Lowella Grip III M.D.   On: 09/14/2015 16:01   [name]  09/15/2015  CLINICAL DATA:  Slurred speech and seizures. EXAM: MRI HEAD WITHOUT CONTRAST TECHNIQUE: Multiplanar, multiecho pulse sequences of the brain and surrounding structures were obtained without intravenous contrast. COMPARISON:  None. FINDINGS: Best obtainable exam is partial due to patient crawling out of scanner. Calvarium and upper cervical spine: Hypo intense marrow, likely from patient's end-stage renal disease status. No focal marrow lesion is seen. Orbits: Negative. Sinuses and Mastoids: Symmetric prominence of nasopharyngeal soft tissues without mastoid effusion or skullbase erosive change. Clear paranasal sinuses. Brain: No acute abnormality such as acute infarct, hemorrhage, hydrocephalus, or mass lesion. No evidence of large vessel occlusion. Mild generalized cerebral volume loss. Mild FLAIR hyperintensity in the cerebral white matter, likely early small vessel ischemia given risk factors. IMPRESSION: Incomplete study but diagnostic in excluding infarct. No focal cortical finding to explain seizure. Electronically Signed   By: Monte Fantasia M.D.   On: 09/15/2015 07:54   Dg Chest Portable 1 View  09/14/2015  CLINICAL DATA:  Patient altered mental status.  Seizure. EXAM: PORTABLE CHEST 1 VIEW COMPARISON:  Chest radiograph 07/14/2012. FINDINGS: Stable cardiac and  mediastinal contours. No consolidative pulmonary opacities. No pleural effusion or pneumothorax. IMPRESSION: No acute cardiopulmonary process. Electronically Signed   By: Lovey Newcomer M.D.   On:  09/14/2015 18:08     Medical Consultants:    None.  Anti-Infectives:   Anti-infectives    None      Subjective:    [place] she she is hungry she has no new complaints.  Objective:    Filed Vitals:   09/15/15 0300 09/15/15 0459 09/15/15 0644 09/15/15 1004  BP:  107/64 107/64 109/80  Pulse:  96 101 93  Temp: 98 F (36.7 C) 98.1 F (36.7 C) 98.2 F (36.8 C) 98.9 F (37.2 C)  TempSrc:  Oral Oral Oral  Resp:  18 20 20   Height:      Weight:      SpO2:  95% 96% 98%    Intake/Output Summary (Last 24 hours) at 09/15/15 1008 Last data filed at 09/14/15 1714  Gross per 24 hour  Intake      0 ml  Output    400 ml  Net   -400 ml   Filed Weights   09/15/15 0200  Weight: 69.8 kg (153 lb 14.1 oz)    Exam: Gen:  NAD Cardiovascular:  RRR, No M/R/G Chest and lungs:   CTAB Abdomen:  Abdomen soft, NT/ND, + BS Extremities:  No C/E/C  Data Reviewed:    Labs: Basic Metabolic Panel:  Recent Labs Lab 09/14/15 1539 09/14/15 1546  NA 138 135  K 3.2* 3.1*  CL 91* 93*  CO2 22  --   GLUCOSE 123* 119*  BUN 54* 48*  CREATININE 10.08* 9.30*  CALCIUM 9.7  --    GFR Estimated Creatinine Clearance: 6.9 mL/min (by C-G formula based on Cr of 9.3). Liver Function Tests:  Recent Labs Lab 09/14/15 1539  AST 24  ALT 9*  ALKPHOS 172*  BILITOT 0.4  PROT 7.3  ALBUMIN 3.6   No results for input(s): LIPASE, AMYLASE in the last 168 hours.  Recent Labs Lab 09/14/15 1539  AMMONIA 65*   Coagulation profile  Recent Labs Lab 09/14/15 1539  INR 1.22    CBC:  Recent Labs Lab 09/14/15 1539 09/14/15 1546  WBC 12.4*  --   NEUTROABS 9.6*  --   HGB 10.4* 12.2  HCT 32.7* 36.0  MCV 97.9  --   PLT 421*  --    Cardiac Enzymes: No results for input(s): CKTOTAL, CKMB, CKMBINDEX, TROPONINI in the last 168 hours. BNP (last 3 results) No results for input(s): PROBNP in the last 8760 hours. CBG:  Recent Labs Lab 09/14/15 1537  GLUCAP 118*    D-Dimer: No results for input(s): DDIMER in the last 72 hours. Hgb A1c: No results for input(s): HGBA1C in the last 72 hours. Lipid Profile:  Recent Labs  09/15/15 0520  CHOL 155  HDL 33*  LDLCALC 102*  TRIG 98  CHOLHDL 4.7   Thyroid function studies:  Recent Labs  09/14/15 1553  TSH 1.138   Anemia work up: No results for input(s): VITAMINB12, FOLATE, FERRITIN, TIBC, IRON, RETICCTPCT in the last 72 hours. Sepsis Labs:  Recent Labs Lab 09/14/15 1539  WBC 12.4*   Microbiology No results found for this or any previous visit (from the past 240 hour(s)).   Medications:   . heparin  5,000 Units Subcutaneous 3 times per day  . [START ON 09/22/2015] lacosamide  100 mg Oral BID  . lacosamide  50 mg Oral BID  .  levETIRAcetam  500 mg Intravenous Q12H  . levothyroxine  25 mcg Intravenous Daily   Continuous Infusions:   Time spent: 25 min   LOS: 1 day   Charlynne Cousins  [facility] Pager [phone]  *Please refer to [email or web address], password [email or web address] to get updated schedule on who will round on this patient, as hospitalists switch teams weekly. If 7PM-7AM, please contact night-coverage at [email or web address], password [email or web address] for any overnight needs.  09/15/2015, 10:08 AM

## 2015-09-15 NOTE — Progress Notes (Signed)
Routine EEG completed, results pending. 

## 2015-09-15 NOTE — Care Management Note (Signed)
Case Management Note  Patient Details  Name: Jane [name] MRN: [id number] Date of Birth: May 30, 1966  Subjective/Objective:    Patient admitted with seizures. Patient has not had a seizure in several years and has not been on antiepileptic meds. Patient is from home with family and is ESRD on peritoneal dialysis. Patient also with diagnosis of MR/ Schizophrenia.                Action/Plan: Awaiting further testing. CM will continue to follow for discharge needs.   Expected Discharge Date:                  Expected Discharge Plan:     In-House Referral:     Discharge planning Services     Post Acute Care Choice:    Choice offered to:     DME Arranged:    DME Agency:     HH Arranged:    HH Agency:     Status of Service:  In process, will continue to follow  Medicare Important Message Given:    Date Medicare IM Given:    Medicare IM give by:    Date Additional Medicare IM Given:    Additional Medicare Important Message give by:     If discussed at [place] of Stay Meetings, dates discussed:    Additional Comments:  Pollie Friar, RN 09/15/2015, 11:07 AM

## 2015-09-15 NOTE — Progress Notes (Signed)
Subjective: patient currently awake and very hungry asking for food. Sister next to her states she is at baseline.   Objective: Current vital signs: BP 107/64 mmHg  Pulse 101  Temp(Src) 98.2 F (36.8 C) (Oral)  Resp 20  Ht 5\' 3"  (1.6 m)  Wt 69.8 kg (153 lb 14.1 oz)  BMI 27.27 kg/m2  SpO2 96%  LMP  Vital signs in last 24 hours: Temp:  [97.7 F (36.5 C)-98.2 F (36.8 C)] 98.2 F (36.8 C) (11/23 0644) Pulse Rate:  [84-103] 101 (11/23 0644) Resp:  [14-24] 20 (11/23 0644) BP: (93-144)/(54-94) 107/64 mmHg (11/23 0644) SpO2:  [91 %-100 %] 96 % (11/23 0644) Weight:  [69.8 kg (153 lb 14.1 oz)] 69.8 kg (153 lb 14.1 oz) (11/23 0200)  Intake/Output from previous day: 11/22 0701 - 11/23 0700 In: -  Out: 400 [Urine:400] Intake/Output this shift:   Nutritional status: Diet NPO time specified  Neurologic Exam: General: NAD Mental Status: Alert, oriented to hospital and sister.   Speech fluent without evidence of aphasia.  Able to follow simple commands without difficulty --moderate MR at baseline.  Cranial Nerves: II: Visual fields grossly normal, pupils equal, round, reactive to light and accommodation III,IV, VI: ptosis not present, extra-ocular motions intact bilaterally V,VII: smile symmetric, facial light touch sensation normal bilaterally VIII: hearing normal bilaterally IX,X: uvula rises symmetrically XI: bilateral shoulder shrug XII: midline tongue extension without atrophy or fasciculations  Motor: Moving all extremities antigravity.  Sensory: Pinprick and light touch intact throughout, bilaterally Deep Tendon Reflexes:  Right: Upper Extremity   Left: Upper extremity   biceps (C-5 to C-6) 2/4   biceps (C-5 to C-6) 2/4 tricep (C7) 2/4    triceps (C7) 2/4 Brachioradialis (C6) 2/4  Brachioradialis (C6) 2/4  Lower Extremity Lower Extremity  quadriceps (L-2 to L-4) 2/4   quadriceps (L-2 to L-4) 2/4   Plantars: Right: downgoing   Left: downgoing    Lab  Results: Basic Metabolic Panel:  Recent Labs Lab 09/14/15 1539 09/14/15 1546  NA 138 135  K 3.2* 3.1*  CL 91* 93*  CO2 22  --   GLUCOSE 123* 119*  BUN 54* 48*  CREATININE 10.08* 9.30*  CALCIUM 9.7  --     Liver Function Tests:  Recent Labs Lab 09/14/15 1539  AST 24  ALT 9*  ALKPHOS 172*  BILITOT 0.4  PROT 7.3  ALBUMIN 3.6   No results for input(s): LIPASE, AMYLASE in the last 168 hours.  Recent Labs Lab 09/14/15 1539  AMMONIA 65*    CBC:  Recent Labs Lab 09/14/15 1539 09/14/15 1546  WBC 12.4*  --   NEUTROABS 9.6*  --   HGB 10.4* 12.2  HCT 32.7* 36.0  MCV 97.9  --   PLT 421*  --     Cardiac Enzymes: No results for input(s): CKTOTAL, CKMB, CKMBINDEX, TROPONINI in the last 168 hours.  Lipid Panel:  Recent Labs Lab 09/15/15 0520  CHOL 155  TRIG 98  HDL 33*  CHOLHDL 4.7  VLDL 20  LDLCALC 102*    CBG:  Recent Labs Lab 09/14/15 1537  GLUCAP 118*    Microbiology: Results for orders placed or performed during the hospital encounter of 07/14/12  Urine culture     Status: None   Collection Time: 07/14/12  8:05 PM  Result Value Ref Range Status   Specimen Description URINE, CATHETERIZED  Final   Special Requests ADDED [id number] 07/15/12  Final   Culture  Setup Time 07/14/2012 02:27  Final   Colony Count NO GROWTH  Final   Culture NO GROWTH  Final   Report Status 07/15/2012 FINAL  Final    Coagulation Studies:  Recent Labs  09/14/15 1539  LABPROT 15.6*  INR 1.22    Imaging: Ct Head Wo Contrast  09/14/2015  CLINICAL DATA:  Slurred speech EXAM: CT HEAD WITHOUT CONTRAST TECHNIQUE: Contiguous axial images were obtained from the base of the skull through the vertex without intravenous contrast. COMPARISON:  July 14, 2012 FINDINGS: The ventricles are normal in size and configuration. There is no intracranial mass hemorrhage, extra-axial fluid collection, or midline shift. No focal gray-white compartment lesions are identified. No acute  infarct evident. The bony calvarium appears intact. The mastoid air cells are clear. There is leftward deviation of the nasal septum. IMPRESSION: No intracranial mass or hemorrhage. Gray-white compartments appear normal. No acute infarct evident. Electronically Signed   By: Lowella Grip III M.D.   On: 09/14/2015 16:01   [name]  09/15/2015  CLINICAL DATA:  Slurred speech and seizures. EXAM: MRI HEAD WITHOUT CONTRAST TECHNIQUE: Multiplanar, multiecho pulse sequences of the brain and surrounding structures were obtained without intravenous contrast. COMPARISON:  None. FINDINGS: Best obtainable exam is partial due to patient crawling out of scanner. Calvarium and upper cervical spine: Hypo intense marrow, likely from patient's end-stage renal disease status. No focal marrow lesion is seen. Orbits: Negative. Sinuses and Mastoids: Symmetric prominence of nasopharyngeal soft tissues without mastoid effusion or skullbase erosive change. Clear paranasal sinuses. Brain: No acute abnormality such as acute infarct, hemorrhage, hydrocephalus, or mass lesion. No evidence of large vessel occlusion. Mild generalized cerebral volume loss. Mild FLAIR hyperintensity in the cerebral white matter, likely early small vessel ischemia given risk factors. IMPRESSION: Incomplete study but diagnostic in excluding infarct. No focal cortical finding to explain seizure. Electronically Signed   By: Monte Fantasia M.D.   On: 09/15/2015 07:54   Dg Chest Portable 1 View  09/14/2015  CLINICAL DATA:  Patient altered mental status.  Seizure. EXAM: PORTABLE CHEST 1 VIEW COMPARISON:  Chest radiograph 07/14/2012. FINDINGS: Stable cardiac and mediastinal contours. No consolidative pulmonary opacities. No pleural effusion or pneumothorax. IMPRESSION: No acute cardiopulmonary process. Electronically Signed   By: Lovey Newcomer M.D.   On: 09/14/2015 18:08    Medications:  Scheduled: . heparin  5,000 Units Subcutaneous 3 times per  day  . [START ON 09/22/2015] lacosamide  100 mg Oral BID  . lacosamide  50 mg Oral BID  . levETIRAcetam  500 mg Intravenous Q12H  . levothyroxine  25 mcg Intravenous Daily    Assessment/Plan: 49 year old female with a remote history of seizures who presents with new partial seizures. No further seizures over night after Vimpat started. MRI shows no acute stroke. EEG obtained but not read.   Recommend: 1) Continue Vimpat 50 mg BID for one week and then 100 mg BID 2) Follow up out patient neurology   Etta Quill PA-C [facility] [phone]   I personally participated in this patient's evaluation and management, including formulating above clinical impression and management recommendations.  Rush Farmer M.D. [facility] [phone]   09/15/2015, 9:59 AM

## 2015-09-15 NOTE — Consult Note (Signed)
Reason for Consult:ESRD, PD Referring Physician: Dr. Maurene Capes Jane [name] is an 49 y.o. female.  HPI: 49 yr female with hx shcizo, cerebral palsy, MR, bipolar, sz disorder, HTN, gout, and ESRD on PD through the [facility].  Admitted last pm with repeated sz.  Apparently off meds for 2 yr.  She cannot give hx, and only brother in law is present.  Called and got recent PD hx from [place] [facility] in [place]. Review of systems not obtained due to patient factors.  Dialyzes at home on. 3 ex/d 1550 ccPrimary Nephrologist Py[place]le. EDW unknown.   Past Medical History  Diagnosis Date  . Renal disease   . Hypertension   . Anemia   . Hyperlipemia   . Moderately mentally retarded   . Bipolar disorder ([place])   . Depression   . Hypothyroidism   . Seizures ([place])     No recent seizures - no meds  . Schizophrenia ([place])   . ESRD on peritoneal dialysis [facility])     Past Surgical History  Procedure Laterality Date  . Breast reduction surgery    . Foot surgery      right  . Examination under anesthesia N/A 12/08/2013    Procedure: EXAM UNDER ANESTHESIA;  Surgeon: Lavonia Drafts, MD;  Location: [facility];  Service: Gynecology;  Laterality: N/A;  Pelvic exam and pap smear, unable to tolerate during last office visit  . Breast surgery      breast reduction    History reviewed. No pertinent family history.  Social History:  reports that she has never smoked. She has never used smokeless tobacco. She reports that she does not drink alcohol or use illicit drugs.  Allergies: No Known Allergies  Medications:  I have reviewed the patient's current medications. Prior to Admission:  Prescriptions prior to admission  Medication Sig Dispense Refill Last Dose  . allopurinol (ZYLOPRIM) 300 MG tablet Take 300 mg by mouth daily.   09/13/2015 at Unknown time  . cinacalcet (SENSIPAR) 30 MG tablet Take 30 mg by mouth daily.   09/13/2015 at Unknown time  . cyanocobalamin 500 MCG tablet Take 500 mcg by mouth  daily.   09/13/2015 at Unknown time  . levothyroxine (SYNTHROID, LEVOTHROID) 50 MCG tablet Take 50 mcg by mouth daily.   09/13/2015 at Unknown time  . potassium chloride (KLOR-CON) 20 MEQ packet Take 20 mEq by mouth daily.    09/13/2015 at Unknown time  . risperiDONE (RISPERDAL) 0.5 MG tablet Take 0.5 mg by mouth 2 (two) times daily as needed. As needed for agitation   09/13/2015 at Unknown time  . risperiDONE (RISPERDAL) 3 MG tablet Take 3 mg by mouth at bedtime.   09/13/2015 at Unknown time  . sevelamer carbonate (RENVELA) 800 MG tablet Take 800 mg by mouth 3 (three) times daily with meals.   09/13/2015 at Unknown time  . vitamin B-12 (CYANOCOBALAMIN) 500 MCG tablet Take 500 mcg by mouth daily.   09/13/2015 at Unknown time  . ferrous sulfate 325 (65 FE) MG EC tablet Take 325 mg by mouth 2 (two) times daily.    Not Taking  . medroxyPROGESTERone (DEPO-PROVERA) 150 MG/ML injection Inject 150 mg into the muscle every 3 (three) months.    unknown at unknown    , Aranesp 137mg SQ qowk.   Results for orders placed or performed during the hospital encounter of 09/14/15 (from the past 48 hour(s))  CBG monitoring, ED     Status: Abnormal   Collection Time: 09/14/15  3:37 PM  Result Value Ref Range   Glucose-Capillary 118 (H) 65 - 99 mg/dL  Protime-INR     Status: Abnormal   Collection Time: 09/14/15  3:39 PM  Result Value Ref Range   Prothrombin Time 15.6 (H) 11.6 - 15.2 seconds   INR 1.22 0.00 - 1.49  CBC     Status: Abnormal   Collection Time: 09/14/15  3:39 PM  Result Value Ref Range   WBC 12.4 (H) 4.0 - 10.5 K/uL   RBC 3.34 (L) 3.87 - 5.11 MIL/uL   Hemoglobin 10.4 (L) 12.0 - 15.0 g/dL   HCT 32.7 (L) 36.0 - 46.0 %   MCV 97.9 78.0 - 100.0 fL   MCH 31.1 26.0 - 34.0 pg   MCHC 31.8 30.0 - 36.0 g/dL   RDW 15.0 11.5 - 15.5 %   Platelets 421 (H) 150 - 400 K/uL  Differential     Status: Abnormal   Collection Time: 09/14/15  3:39 PM  Result Value Ref Range   Neutrophils Relative % 78 %    Neutro Abs 9.6 (H) 1.7 - 7.7 K/uL   Lymphocytes Relative 17 %   Lymphs Abs 2.1 0.7 - 4.0 K/uL   Monocytes Relative 5 %   Monocytes Absolute 0.6 0.1 - 1.0 K/uL   Eosinophils Relative 0 %   Eosinophils Absolute 0.0 0.0 - 0.7 K/uL   Basophils Relative 0 %   Basophils Absolute 0.0 0.0 - 0.1 K/uL  Comprehensive metabolic panel     Status: Abnormal   Collection Time: 09/14/15  3:39 PM  Result Value Ref Range   Sodium 138 135 - 145 mmol/L   Potassium 3.2 (L) 3.5 - 5.1 mmol/L   Chloride 91 (L) 101 - 111 mmol/L   CO2 22 22 - 32 mmol/L   Glucose, Bld 123 (H) 65 - 99 mg/dL   BUN 54 (H) 6 - 20 mg/dL   Creatinine, Ser 10.08 (H) 0.44 - 1.00 mg/dL   Calcium 9.7 8.9 - 10.3 mg/dL   Total Protein 7.3 6.5 - 8.1 g/dL   Albumin 3.6 3.5 - 5.0 g/dL   AST 24 15 - 41 U/L   ALT 9 (L) 14 - 54 U/L   Alkaline Phosphatase 172 (H) 38 - 126 U/L   Total Bilirubin 0.4 0.3 - 1.2 mg/dL   GFR calc non Af Amer 4 (L) >60 mL/min   GFR calc Af Amer 5 (L) >60 mL/min    Comment: (NOTE) The eGFR has been calculated using the CKD EPI equation. This calculation has not been validated in all clinical situations. eGFR's persistently <60 mL/min signify possible Chronic Kidney Disease.    Anion gap 25 (H) 5 - 15  APTT     Status: None   Collection Time: 09/14/15  3:39 PM  Result Value Ref Range   aPTT 27 24 - 37 seconds  Ammonia     Status: Abnormal   Collection Time: 09/14/15  3:39 PM  Result Value Ref Range   Ammonia 65 (H) 9 - 35 umol/L  I-stat troponin, ED (not at [facility], [facility])     Status: None   Collection Time: 09/14/15  3:44 PM  Result Value Ref Range   Troponin i, poc 0.01 0.00 - 0.08 ng/mL   Comment 3            Comment: Due to the release kinetics of cTnI, a negative result within the first hours of the onset of symptoms does not rule out myocardial infarction  with certainty. If myocardial infarction is still suspected, repeat the test at appropriate intervals.   I-Stat Chem 8, ED  (not at [facility], [facility])      Status: Abnormal   Collection Time: 09/14/15  3:46 PM  Result Value Ref Range   Sodium 135 135 - 145 mmol/L   Potassium 3.1 (L) 3.5 - 5.1 mmol/L   Chloride 93 (L) 101 - 111 mmol/L   BUN 48 (H) 6 - 20 mg/dL   Creatinine, Ser 9.30 (H) 0.44 - 1.00 mg/dL   Glucose, Bld 119 (H) 65 - 99 mg/dL   Calcium, Ion 1.07 (L) 1.12 - 1.23 mmol/L   TCO2 23 0 - 100 mmol/L   Hemoglobin 12.2 12.0 - 15.0 g/dL   HCT 36.0 36.0 - 46.0 %  TSH     Status: None   Collection Time: 09/14/15  3:53 PM  Result Value Ref Range   TSH 1.138 0.350 - 4.500 uIU/mL  Urinalysis, Routine w reflex microscopic (not at [facility])     Status: Abnormal   Collection Time: 09/14/15  5:14 PM  Result Value Ref Range   Color, Urine YELLOW YELLOW   APPearance CLEAR CLEAR   Specific Gravity, Urine 1.010 1.005 - 1.030   pH 7.5 5.0 - 8.0   Glucose, UA NEGATIVE NEGATIVE mg/dL   Hgb urine dipstick TRACE (A) NEGATIVE   Bilirubin Urine NEGATIVE NEGATIVE   Ketones, ur NEGATIVE NEGATIVE mg/dL   Protein, ur 30 (A) NEGATIVE mg/dL   Nitrite NEGATIVE NEGATIVE   Leukocytes, UA NEGATIVE NEGATIVE  Urine microscopic-add on     Status: Abnormal   Collection Time: 09/14/15  5:14 PM  Result Value Ref Range   Squamous Epithelial / LPF 0-5 (A) NONE SEEN    Comment: Please note change in reference range.   WBC, UA 0-5 0 - 5 WBC/hpf    Comment: Please note change in reference range.   RBC / HPF 0-5 0 - 5 RBC/hpf    Comment: Please note change in reference range.   Bacteria, UA FEW (A) NONE SEEN    Comment: Please note change in reference range.  Lipid panel     Status: Abnormal   Collection Time: 09/15/15  5:20 AM  Result Value Ref Range   Cholesterol 155 0 - 200 mg/dL   Triglycerides 98 <150 mg/dL   HDL 33 (L) >40 mg/dL   Total CHOL/HDL Ratio 4.7 RATIO   VLDL 20 0 - 40 mg/dL   LDL Cholesterol 102 (H) 0 - 99 mg/dL    Comment:        Total Cholesterol/HDL:CHD Risk Coronary Heart Disease Risk Table                     Men   Women  1/2 Average  Risk   3.4   3.3  Average Risk       5.0   4.4  2 X Average Risk   9.6   7.1  3 X Average Risk  23.4   11.0        Use the calculated Patient Ratio above and the CHD Risk Table to determine the patient's CHD Risk.        ATP III CLASSIFICATION (LDL):  <100     mg/dL   Optimal  100-129  mg/dL   Near or Above                    Optimal  130-159  mg/dL  Borderline  160-189  mg/dL   High  >190     mg/dL   Very High     Ct Head Wo Contrast  09/14/2015  CLINICAL DATA:  Slurred speech EXAM: CT HEAD WITHOUT CONTRAST TECHNIQUE: Contiguous axial images were obtained from the base of the skull through the vertex without intravenous contrast. COMPARISON:  July 14, 2012 FINDINGS: The ventricles are normal in size and configuration. There is no intracranial mass hemorrhage, extra-axial fluid collection, or midline shift. No focal gray-white compartment lesions are identified. No acute infarct evident. The bony calvarium appears intact. The mastoid air cells are clear. There is leftward deviation of the nasal septum. IMPRESSION: No intracranial mass or hemorrhage. Gray-white compartments appear normal. No acute infarct evident. Electronically Signed   By: Lowella Grip III M.D.   On: 09/14/2015 16:01   [name]  09/15/2015  CLINICAL DATA:  Slurred speech and seizures. EXAM: MRI HEAD WITHOUT CONTRAST TECHNIQUE: Multiplanar, multiecho pulse sequences of the brain and surrounding structures were obtained without intravenous contrast. COMPARISON:  None. FINDINGS: Best obtainable exam is partial due to patient crawling out of scanner. Calvarium and upper cervical spine: Hypo intense marrow, likely from patient's end-stage renal disease status. No focal marrow lesion is seen. Orbits: Negative. Sinuses and Mastoids: Symmetric prominence of nasopharyngeal soft tissues without mastoid effusion or skullbase erosive change. Clear paranasal sinuses. Brain: No acute abnormality such as acute  infarct, hemorrhage, hydrocephalus, or mass lesion. No evidence of large vessel occlusion. Mild generalized cerebral volume loss. Mild FLAIR hyperintensity in the cerebral white matter, likely early small vessel ischemia given risk factors. IMPRESSION: Incomplete study but diagnostic in excluding infarct. No focal cortical finding to explain seizure. Electronically Signed   By: Monte Fantasia M.D.   On: 09/15/2015 07:54   Dg Chest Portable 1 View  09/14/2015  CLINICAL DATA:  Patient altered mental status.  Seizure. EXAM: PORTABLE CHEST 1 VIEW COMPARISON:  Chest radiograph 07/14/2012. FINDINGS: Stable cardiac and mediastinal contours. No consolidative pulmonary opacities. No pleural effusion or pneumothorax. IMPRESSION: No acute cardiopulmonary process. Electronically Signed   By: Lovey Newcomer M.D.   On: 09/14/2015 18:08    ROS Blood pressure 109/80, pulse 93, temperature 98.9 F (37.2 C), temperature source Oral, resp. rate 20, height _0  (1.6 m), weight 69.8 kg (153 lb 14.1 oz), SpO2 98 %. Physical Exam Physical Examination: General appearance - sleeping wakes up but will not interact Mental status - as above Eyes - pupils equal and reactive, extraocular eye movements intact Mouth - mucous membranes moist, pharynx normal without lesions Neck - adenopathy noted PCL Lymphatics - posterior cervical nodes Heart - S1 and S2 normal, systolic murmur ER8/4 at apex Abdomen - soft, pos bs, PD cath L mid abdm Extremities - no edema, drawnup will not cooperate  Assessment/Plan: 1 Sz disorder per Neuro 2 ESRD: will use regimen as obtained from HT at [facility], vol ok. Chronic low K 3 Hypertension: not an issue 4. Anemia of ESRD:  Hold off tx 5. Metabolic Bone Disease:  Cont sensipar 6 Cp 7 Schizo/bipolar P PD, meds,follow  Wrenly Lauritsen L 09/15/2015, 1:14 PM

## 2015-09-15 NOTE — Progress Notes (Signed)
Patient's sister requests to perform continuous ambulatory peritoneal dialysis (CAPD) to patient while she's in the hospital. Dr. Augustin Coupe notified,okay for family member to perform CAPD with staff presence.Valaria Good, Wonda Cheng, RN

## 2015-09-15 NOTE — Procedures (Signed)
ELECTROENCEPHALOGRAM REPORT  Date of Study: 09/15/2015  Patient's Name: Jane [name] MRN: [id number] Date of Birth: 04-Sep-1966  Referring Provider: Dr. Merton Border  Clinical History: This is a 49 year old woman with a remote history of seizures who presents with new partial seizures.   Medications: lacosamide (VIMPAT) tablet 100 mg levETIRAcetam (KEPPRA) 500 mg in sodium chloride 0.9 % 100 mL IVPB haloperidol lactate (HALDOL) injection 2 mg levothyroxine (SYNTHROID, LEVOTHROID) injection 25 mcg  Technical Summary: A multichannel digital EEG recording measured by the international 10-20 system with electrodes applied with paste and impedances below 5000 ohms performed in our laboratory with EKG monitoring in an awake and asleep patient.  Hyperventilation and photic stimulation were not performed.  The digital EEG was referentially recorded, reformatted, and digitally filtered in a variety of bipolar and referential montages for optimal display.    Description: The patient is awake and asleep during the recording.  During maximal wakefulness, there is a symmetric, medium voltage 8-9 Hz posterior dominant rhythm that attenuates with eye opening.  There is frequent focal 4-5 Hz theta and 2-3 Hz delta slowing over the left temporal region. During drowsiness and sleep, there is an increase in theta and delta slowing of the background, more pronounced over the left temporal region.  Vertex waves and symmetric sleep spindles were seen.  Hyperventilation and photic stimulation were not performed.  There were no epileptiform discharges or electrographic seizures seen.    EKG lead was unremarkable.  Impression: This awake and asleep EEG is abnormal due to focal slowing over the left temporal region.  Clinical Correlation of the above findings indicates focal cerebral dysfunction over the left temporal region suggestive of underlying structural or physiologic abnormality. The absence of  epileptiform discharges does not exclude a clinical diagnosis of epilepsy. Clinical correlation is advised.   Ellouise Newer, M.D.

## 2015-09-16 LAB — COMPREHENSIVE METABOLIC PANEL
ALK PHOS: 152 U/L — AB (ref 38–126)
ALT: 9 U/L — AB (ref 14–54)
AST: 14 U/L — AB (ref 15–41)
Albumin: 2.8 g/dL — ABNORMAL LOW (ref 3.5–5.0)
Anion gap: 13 (ref 5–15)
BILIRUBIN TOTAL: 0.4 mg/dL (ref 0.3–1.2)
BUN: 58 mg/dL — AB (ref 6–20)
CO2: 30 mmol/L (ref 22–32)
CREATININE: 10.05 mg/dL — AB (ref 0.44–1.00)
Calcium: 8.2 mg/dL — ABNORMAL LOW (ref 8.9–10.3)
Chloride: 89 mmol/L — ABNORMAL LOW (ref 101–111)
GFR calc Af Amer: 5 mL/min — ABNORMAL LOW (ref >60)
GFR, EST NON AFRICAN AMERICAN: 4 mL/min — AB (ref >60)
Glucose, Bld: 110 mg/dL — ABNORMAL HIGH (ref 65–99)
Potassium: 3.6 mmol/L (ref 3.5–5.1)
Sodium: 132 mmol/L — ABNORMAL LOW (ref 135–145)
TOTAL PROTEIN: 6.1 g/dL — AB (ref 6.5–8.1)

## 2015-09-16 LAB — CBC
HEMATOCRIT: 27.6 % — AB (ref 36.0–46.0)
HEMOGLOBIN: 9.2 g/dL — AB (ref 12.0–15.0)
MCH: 31.8 pg (ref 26.0–34.0)
MCHC: 33.3 g/dL (ref 30.0–36.0)
MCV: 95.5 fL (ref 78.0–100.0)
Platelets: 339 10*3/uL (ref 150–400)
RBC: 2.89 MIL/uL — AB (ref 3.87–5.11)
RDW: 14.7 % (ref 11.5–15.5)
WBC: 7.9 10*3/uL (ref 4.0–10.5)

## 2015-09-16 LAB — HEMOGLOBIN A1C
HEMOGLOBIN A1C: 5.2 % (ref 4.8–5.6)
MEAN PLASMA GLUCOSE: 103 mg/dL

## 2015-09-16 LAB — GLUCOSE, CAPILLARY: GLUCOSE-CAPILLARY: 100 mg/dL — AB (ref 65–99)

## 2015-09-16 LAB — PHOSPHORUS: PHOSPHORUS: 4.2 mg/dL (ref 2.5–4.6)

## 2015-09-16 MED ORDER — ALPRAZOLAM 0.25 MG PO TABS
0.2500 mg | ORAL_TABLET | Freq: Once | ORAL | Status: AC
  Administered 2015-09-16: 0.25 mg via ORAL
  Filled 2015-09-16: qty 1

## 2015-09-16 MED ORDER — INFLUENZA VAC SPLIT QUAD 0.5 ML IM SUSY
0.5000 mL | PREFILLED_SYRINGE | INTRAMUSCULAR | Status: AC
  Administered 2015-09-17: 0.5 mL via INTRAMUSCULAR

## 2015-09-16 NOTE — Progress Notes (Signed)
Patient has been restless and was pulling at telemetry and PD catheter.  Has had no IV access since previous shift.  Family member in room does not want any form of restraint on patient.  When RN attempted to place soft mittens, family refused despite being educated on reasoning behind this action.  PD catheter secured to abdomen.  Notified MD on-call of situation.  Will continue to monitor.

## 2015-09-16 NOTE — Progress Notes (Signed)
[facility] PROGRESS NOTE    Progress Note   Jane [name] [id number] DOB: 09-17-1966 DOA: 09/14/2015 PCP: [name], MD   Brief Narrative:   Jane [name] is an 49 y.o. female with past medical history of end-stage renal disease on peritoneal dialysis, cerebral palsy, schizophrenia and hypothyroidism is brought into the ED is unwitnessed seizure at home. As per family members which were in the room they relate that she was on Depakote which was DC'd due to deliver problems.  Assessment/Plan:   Active Problems:   Seizure ([place])   End-stage renal disease on peritoneal dialysis [facility]) Neurology consulted, on keppra an vimpat. EEG results pending.  End-stage renal disease on peritoneal dialysis: Renal was consulted, cont PD per renal. Resume her home medications.  History of cerebral palsy/schizophrenia and bipolar disorder: Resume all her home medications.    DVT Prophylaxis - Lovenox ordered.  Family Communication: sister Disposition Plan: Home in am Code Status:     Code Status Orders        Start     Ordered   09/14/15 2105  Full code   Continuous     09/14/15 2104        IV Access:    Peripheral IV   Procedures and diagnostic studies:   Ct Head Wo Contrast  09/14/2015  CLINICAL DATA:  Slurred speech EXAM: CT HEAD WITHOUT CONTRAST TECHNIQUE: Contiguous axial images were obtained from the base of the skull through the vertex without intravenous contrast. COMPARISON:  July 14, 2012 FINDINGS: The ventricles are normal in size and configuration. There is no intracranial mass hemorrhage, extra-axial fluid collection, or midline shift. No focal gray-white compartment lesions are identified. No acute infarct evident. The bony calvarium appears intact. The mastoid air cells are clear. There is leftward deviation of the nasal septum. IMPRESSION: No intracranial mass or hemorrhage. Gray-white compartments appear normal. No acute infarct  evident. Electronically Signed   By: Lowella Grip III M.D.   On: 09/14/2015 16:01   [name]  09/15/2015  CLINICAL DATA:  Slurred speech and seizures. EXAM: MRI HEAD WITHOUT CONTRAST TECHNIQUE: Multiplanar, multiecho pulse sequences of the brain and surrounding structures were obtained without intravenous contrast. COMPARISON:  None. FINDINGS: Best obtainable exam is partial due to patient crawling out of scanner. Calvarium and upper cervical spine: Hypo intense marrow, likely from patient's end-stage renal disease status. No focal marrow lesion is seen. Orbits: Negative. Sinuses and Mastoids: Symmetric prominence of nasopharyngeal soft tissues without mastoid effusion or skullbase erosive change. Clear paranasal sinuses. Brain: No acute abnormality such as acute infarct, hemorrhage, hydrocephalus, or mass lesion. No evidence of large vessel occlusion. Mild generalized cerebral volume loss. Mild FLAIR hyperintensity in the cerebral white matter, likely early small vessel ischemia given risk factors. IMPRESSION: Incomplete study but diagnostic in excluding infarct. No focal cortical finding to explain seizure. Electronically Signed   By: Monte Fantasia M.D.   On: 09/15/2015 07:54   Dg Chest Portable 1 View  09/14/2015  CLINICAL DATA:  Patient altered mental status.  Seizure. EXAM: PORTABLE CHEST 1 VIEW COMPARISON:  Chest radiograph 07/14/2012. FINDINGS: Stable cardiac and mediastinal contours. No consolidative pulmonary opacities. No pleural effusion or pneumothorax. IMPRESSION: No acute cardiopulmonary process. Electronically Signed   By: Lovey Newcomer M.D.   On: 09/14/2015 18:08     Medical Consultants:    None.  Anti-Infectives:   Anti-infectives    None      Subjective:    Jane [name]  has no new complaints.  Objective:    Filed Vitals:   09/15/15 1740 09/15/15 2044 09/16/15 0408 09/16/15 0900  BP: 127/80 95/61 102/58 109/74  Pulse: 85 108 95 101  Temp:  97.6 F (36.4 C) 98.8 F (37.1 C) 97.8 F (36.6 C) 98.6 F (37 C)  TempSrc: Oral Oral Oral Oral  Resp: 18 17 18 18   Height:      Weight:  69.7 kg (153 lb 10.6 oz)    SpO2:  97% 96% 97%    Intake/Output Summary (Last 24 hours) at 09/16/15 1322 Last data filed at 09/16/15 0900  Gross per 24 hour  Intake    840 ml  Output   2900 ml  Net  -2060 ml   Filed Weights   09/15/15 0200 09/15/15 2044  Weight: 69.8 kg (153 lb 14.1 oz) 69.7 kg (153 lb 10.6 oz)    Exam: Gen:  NAD Cardiovascular:  RRR, No M/R/G Chest and lungs:   CTAB Abdomen:  Abdomen soft, NT/ND, + BS Extremities:  No C/E/C  Data Reviewed:    Labs: Basic Metabolic Panel:  Recent Labs Lab 09/14/15 1539 09/14/15 1546 09/16/15 0441  NA 138 135 132*  K 3.2* 3.1* 3.6  CL 91* 93* 89*  CO2 22  --  30  GLUCOSE 123* 119* 110*  BUN 54* 48* 58*  CREATININE 10.08* 9.30* 10.05*  CALCIUM 9.7  --  8.2*  PHOS  --   --  4.2   GFR Estimated Creatinine Clearance: 6.4 mL/min (by C-G formula based on Cr of 10.05). Liver Function Tests:  Recent Labs Lab 09/14/15 1539 09/16/15 0441  AST 24 14*  ALT 9* 9*  ALKPHOS 172* 152*  BILITOT 0.4 0.4  PROT 7.3 6.1*  ALBUMIN 3.6 2.8*   No results for input(s): LIPASE, AMYLASE in the last 168 hours.  Recent Labs Lab 09/14/15 1539  AMMONIA 65*   Coagulation profile  Recent Labs Lab 09/14/15 1539  INR 1.22    CBC:  Recent Labs Lab 09/14/15 1539 09/14/15 1546 09/16/15 0441  WBC 12.4*  --  7.9  NEUTROABS 9.6*  --   --   HGB 10.4* 12.2 9.2*  HCT 32.7* 36.0 27.6*  MCV 97.9  --  95.5  PLT 421*  --  339   Cardiac Enzymes: No results for input(s): CKTOTAL, CKMB, CKMBINDEX, TROPONINI in the last 168 hours. BNP (last 3 results) No results for input(s): PROBNP in the last 8760 hours. CBG:  Recent Labs Lab 09/14/15 1537 09/16/15 0820  GLUCAP 118* 100*   D-Dimer: No results for input(s): DDIMER in the last 72 hours. Hgb A1c:  Recent Labs   09/15/15 0520  HGBA1C 5.2   Lipid Profile:  Recent Labs  09/15/15 0520  CHOL 155  HDL 33*  LDLCALC 102*  TRIG 98  CHOLHDL 4.7   Thyroid function studies:  Recent Labs  09/14/15 1553  TSH 1.138   Anemia work up: No results for input(s): VITAMINB12, FOLATE, FERRITIN, TIBC, IRON, RETICCTPCT in the last 72 hours. Sepsis Labs:  Recent Labs Lab 09/14/15 1539 09/16/15 0441  WBC 12.4* 7.9   Microbiology No results found for this or any previous visit (from the past 240 hour(s)).   Medications:   . allopurinol  300 mg Oral Daily  . cinacalcet  30 mg Oral Q breakfast  . dialysis solution 1.5% low-MG/low-CA   Intraperitoneal TID  . ferrous sulfate  325 mg Oral BID WC  . heparin  5,000 Units Subcutaneous  3 times per day  . [START ON 09/17/2015] Influenza vac split quadrivalent PF  0.5 mL Intramuscular Tomorrow-1000  . [START ON 09/22/2015] lacosamide  100 mg Oral BID  . lacosamide  50 mg Oral BID  . levETIRAcetam  500 mg Oral BID  . levothyroxine  50 mcg Oral QAC breakfast  . multivitamin  1 tablet Oral QHS  . potassium chloride  20 mEq Oral Daily  . risperiDONE  3 mg Oral QHS  . sevelamer carbonate  800 mg Oral TID WC   Continuous Infusions:   Time spent: 15 min   LOS: 2 days   Charlynne Cousins  [facility] Pager [phone]  *Please refer to [email or web address], password [email or web address] to get updated schedule on who will round on this patient, as hospitalists switch teams weekly. If 7PM-7AM, please contact night-coverage at [email or web address], password [email or web address] for any overnight needs.  09/16/2015, 1:22 PM

## 2015-09-16 NOTE — Progress Notes (Signed)
Subjective: Interval History: has no complaint  .  Objective: Vital signs in last 24 hours: Temp:  [97.6 F (36.4 C)-99.1 F (37.3 C)] 98.6 F (37 C) (11/24 0900) Pulse Rate:  [85-108] 101 (11/24 0900) Resp:  [17-18] 18 (11/24 0900) BP: (95-127)/(58-80) 109/74 mmHg (11/24 0900) SpO2:  [96 %-99 %] 97 % (11/24 0900) Weight:  [69.7 kg (153 lb 10.6 oz)] 69.7 kg (153 lb 10.6 oz) (11/23 2044) Weight change: -0.1 kg (-3.5 oz)  Intake/Output from previous day: 11/23 0701 - 11/24 0700 In: 840 [P.O.:840] Out: 2900  Intake/Output this shift:    General appearance: awake, not coop, slurred speech,  Resp: diminished breath sounds bilaterally Cardio: S1, S2 normal and systolic murmur: holosystolic 2/6, blowing at apex GI: pos bs, pos FW, soft, PD cath Extremities: extremities normal, atraumatic, no cyanosis or edema  Lab Results:  Recent Labs  09/14/15 1539 09/14/15 1546 09/16/15 0441  WBC 12.4*  --  7.9  HGB 10.4* 12.2 9.2*  HCT 32.7* 36.0 27.6*  PLT 421*  --  339   BMET:  Recent Labs  09/14/15 1539 09/14/15 1546 09/16/15 0441  NA 138 135 132*  K 3.2* 3.1* 3.6  CL 91* 93* 89*  CO2 22  --  30  GLUCOSE 123* 119* 110*  BUN 54* 48* 58*  CREATININE 10.08* 9.30* 10.05*  CALCIUM 9.7  --  8.2*   No results for input(s): PTH in the last 72 hours. Iron Studies: No results for input(s): IRON, TIBC, TRANSFERRIN, FERRITIN in the last 72 hours.  Studies/Results: Ct Head Wo Contrast  09/14/2015  CLINICAL DATA:  Slurred speech EXAM: CT HEAD WITHOUT CONTRAST TECHNIQUE: Contiguous axial images were obtained from the base of the skull through the vertex without intravenous contrast. COMPARISON:  July 14, 2012 FINDINGS: The ventricles are normal in size and configuration. There is no intracranial mass hemorrhage, extra-axial fluid collection, or midline shift. No focal gray-white compartment lesions are identified. No acute infarct evident. The bony calvarium appears intact. The  mastoid air cells are clear. There is leftward deviation of the nasal septum. IMPRESSION: No intracranial mass or hemorrhage. Gray-white compartments appear normal. No acute infarct evident. Electronically Signed   By: Lowella Grip III M.D.   On: 09/14/2015 16:01   [name]  09/15/2015  CLINICAL DATA:  Slurred speech and seizures. EXAM: MRI HEAD WITHOUT CONTRAST TECHNIQUE: Multiplanar, multiecho pulse sequences of the brain and surrounding structures were obtained without intravenous contrast. COMPARISON:  None. FINDINGS: Best obtainable exam is partial due to patient crawling out of scanner. Calvarium and upper cervical spine: Hypo intense marrow, likely from patient's end-stage renal disease status. No focal marrow lesion is seen. Orbits: Negative. Sinuses and Mastoids: Symmetric prominence of nasopharyngeal soft tissues without mastoid effusion or skullbase erosive change. Clear paranasal sinuses. Brain: No acute abnormality such as acute infarct, hemorrhage, hydrocephalus, or mass lesion. No evidence of large vessel occlusion. Mild generalized cerebral volume loss. Mild FLAIR hyperintensity in the cerebral white matter, likely early small vessel ischemia given risk factors. IMPRESSION: Incomplete study but diagnostic in excluding infarct. No focal cortical finding to explain seizure. Electronically Signed   By: Monte Fantasia M.D.   On: 09/15/2015 07:54   Dg Chest Portable 1 View  09/14/2015  CLINICAL DATA:  Patient altered mental status.  Seizure. EXAM: PORTABLE CHEST 1 VIEW COMPARISON:  Chest radiograph 07/14/2012. FINDINGS: Stable cardiac and mediastinal contours. No consolidative pulmonary opacities. No pleural effusion or pneumothorax. IMPRESSION: No acute cardiopulmonary  process. Electronically Signed   By: Lovey Newcomer M.D.   On: 09/14/2015 18:08    I have reviewed the patient's current medications.  Assessment/Plan: 1 ESRD PD going well , fluid clear, ES ok.  Vol ok. 2  Anemia more than usual follow 3 CP 4 MR 5 Schizo affective 6 HPTH 7 Sz per Neuro P PD 1.5, follow Hb    LOS: 2 days   Quaran Kedzierski L 09/16/2015,11:45 AM

## 2015-09-17 DIAGNOSIS — G40901 Epilepsy, unspecified, not intractable, with status epilepticus: Secondary | ICD-10-CM | POA: Diagnosis not present

## 2015-09-17 MED ORDER — LEVOFLOXACIN 500 MG PO TABS: 500.0000 mg | ORAL_TABLET | ORAL | Status: DC

## 2015-09-17 MED ORDER — LEVETIRACETAM 500 MG PO TABS: 500.0000 mg | ORAL_TABLET | Freq: Two times a day (BID) | ORAL | Status: AC

## 2015-09-17 MED ORDER — LACOSAMIDE 50 MG PO TABS: 50.0000 mg | ORAL_TABLET | Freq: Two times a day (BID) | ORAL | Status: DC

## 2015-09-17 MED ORDER — LACOSAMIDE 100 MG PO TABS: ORAL_TABLET | ORAL | Status: DC

## 2015-09-17 MED ORDER — DIVALPROEX SODIUM 500 MG PO DR TAB: 500.0000 mg | DELAYED_RELEASE_TABLET | Freq: Two times a day (BID) | ORAL | Status: DC

## 2015-09-17 MED ORDER — LEVOFLOXACIN 500 MG PO TABS
500.0000 mg | ORAL_TABLET | ORAL | Status: DC
  Administered 2015-09-17: 500 mg via ORAL
  Filled 2015-09-17: qty 1

## 2015-09-17 NOTE — Progress Notes (Signed)
Patient Discharge: Disposition:  Patient discharged to home with sister.  Education: Reviewed prescriptions, medications, and discharge instructions with the sister, understood and acknowledged. IV: N/A Telemetry: Discontinued before discharge, CCMD notified. Transportation: Patient transported in w/c out of the unit with family and staff member. Belongings: Patient took all her belongings with her.

## 2015-09-17 NOTE — Progress Notes (Signed)
Prescription Vimpat changed to Depakote, family notified.

## 2015-09-17 NOTE — Discharge Summary (Addendum)
Physician Discharge Summary  Jane [name] [id number] DOB: [birth year] DOA: 09/14/2015  PCP: [name], MD  Admit date: 09/14/2015 Discharge date: 09/17/2015  Time spent:35 minutes  Recommendations for Outpatient Follow-up:  1. Follow-up with neurology in 4 weeks.    Discharge Diagnoses:  Active Problems:   Seizure ([place])   End-stage renal disease on peritoneal dialysis [facility])   Discharge Condition: stable  Diet recommendation: heart healthy  Filed Weights   09/15/15 0200 09/15/15 2044 09/16/15 2029  Weight: 69.8 kg (153 lb 14.1 oz) 69.7 kg (153 lb 10.6 oz) 71 kg (156 lb 8.4 oz)    History of present illness:  49 year old with past medical history of end-stage renal disease on peritoneal dialysis cerebral palsy that comes in for 2 episodes of witnessed seizures that started at home on the day of admission.  Hospital Course:  Seizure disorder: Likely due to noncompliance with her medication. She was restarted on Vimpat and Keppra had no further seizure episodes in-house. Neurology works consulted and agreed with treatment. She could not afford the vimpat so it was change to depakote 500 mg BID.  End-stage renal disease on peritoneal dialysis: Continue dialysis per renal.  History of cerebral palsy/schizophrenia and bipolar disorder: Resume all medication  Procedures:  MRI brain  Consultations:  neurology  Discharge Exam: Filed Vitals:   09/17/15 0631 09/17/15 0839  BP: 108/74 113/74  Pulse: 93 115  Temp: 98.6 F (37 C) 98.4 F (36.9 C)  Resp: 16 16    General: A&O x3 Cardiovascular: RRR Respiratory: good air movement CTA B/L  Discharge Instructions   Discharge Instructions    Diet - low sodium heart healthy    Complete by:  As directed      Increase activity slowly    Complete by:  As directed           Discharge Medication List as of 09/17/2015  1:06 PM    START taking these medications   Details  levETIRAcetam (KEPPRA) 500  MG tablet Take 1 tablet (500 mg total) by mouth 2 (two) times daily., Starting 09/17/2015, Until Discontinued, Print    levofloxacin (LEVAQUIN) 500 MG tablet Take 1 tablet (500 mg total) by mouth every other day., Starting 09/17/2015, Until Discontinued, Print    !! lacosamide (VIMPAT) 50 MG TABS tablet Take 1 tablet (50 mg total) by mouth 2 (two) times daily., Starting 09/17/2015, Until Discontinued, Print    !! lacosamide 100 MG TABS Take 1 tab twice a day, Print     !! - Potential duplicate medications found. Please discuss with provider.    CONTINUE these medications which have NOT CHANGED   Details  allopurinol (ZYLOPRIM) 300 MG tablet Take 300 mg by mouth daily., Until Discontinued, Historical Med    cinacalcet (SENSIPAR) 30 MG tablet Take 30 mg by mouth daily., Until Discontinued, Historical Med    !! cyanocobalamin 500 MCG tablet Take 500 mcg by mouth daily., Until Discontinued, Historical Med    potassium chloride (KLOR-CON) 20 MEQ packet Take 20 mEq by mouth daily. , Until Discontinued, Historical Med    !! risperiDONE (RISPERDAL) 0.5 MG tablet Take 0.5 mg by mouth 2 (two) times daily as needed. As needed for agitation, Until Discontinued, Historical Med    !! risperiDONE (RISPERDAL) 3 MG tablet Take 3 mg by mouth at bedtime., Until Discontinued, Historical Med    sevelamer carbonate (RENVELA) 800 MG tablet Take 800 mg by mouth 3 (three) times daily with meals., Until Discontinued, Historical Med    !!  vitamin B-12 (CYANOCOBALAMIN) 500 MCG tablet Take 500 mcg by mouth daily., Until Discontinued, Historical Med    ferrous sulfate 325 (65 FE) MG EC tablet Take 325 mg by mouth 2 (two) times daily. , Until Discontinued, Historical Med    medroxyPROGESTERone (DEPO-PROVERA) 150 MG/ML injection Inject 150 mg into the muscle every 3 (three) months. , Until Discontinued, Historical Med     !! - Potential duplicate medications found. Please discuss with provider.    STOP taking  these medications     levothyroxine (SYNTHROID, LEVOTHROID) 50 MCG tablet        No Known Allergies    The results of significant diagnostics from this hospitalization (including imaging, microbiology, ancillary and laboratory) are listed below for reference.    Significant Diagnostic Studies: Ct Head Wo Contrast  09/14/2015  CLINICAL DATA:  Slurred speech EXAM: CT HEAD WITHOUT CONTRAST TECHNIQUE: Contiguous axial images were obtained from the base of the skull through the vertex without intravenous contrast. COMPARISON:  July 14, 2012 FINDINGS: The ventricles are normal in size and configuration. There is no intracranial mass hemorrhage, extra-axial fluid collection, or midline shift. No focal gray-white compartment lesions are identified. No acute infarct evident. The bony calvarium appears intact. The mastoid air cells are clear. There is leftward deviation of the nasal septum. IMPRESSION: No intracranial mass or hemorrhage. Gray-white compartments appear normal. No acute infarct evident. Electronically Signed   By: Lowella Grip III M.D.   On: 09/14/2015 16:01   [name]  09/15/2015  CLINICAL DATA:  Slurred speech and seizures. EXAM: MRI HEAD WITHOUT CONTRAST TECHNIQUE: Multiplanar, multiecho pulse sequences of the brain and surrounding structures were obtained without intravenous contrast. COMPARISON:  None. FINDINGS: Best obtainable exam is partial due to patient crawling out of scanner. Calvarium and upper cervical spine: Hypo intense marrow, likely from patient's end-stage renal disease status. No focal marrow lesion is seen. Orbits: Negative. Sinuses and Mastoids: Symmetric prominence of nasopharyngeal soft tissues without mastoid effusion or skullbase erosive change. Clear paranasal sinuses. Brain: No acute abnormality such as acute infarct, hemorrhage, hydrocephalus, or mass lesion. No evidence of large vessel occlusion. Mild generalized cerebral volume loss. Mild  FLAIR hyperintensity in the cerebral white matter, likely early small vessel ischemia given risk factors. IMPRESSION: Incomplete study but diagnostic in excluding infarct. No focal cortical finding to explain seizure. Electronically Signed   By: Monte Fantasia M.D.   On: 09/15/2015 07:54   Dg Chest Portable 1 View  09/14/2015  CLINICAL DATA:  Patient altered mental status.  Seizure. EXAM: PORTABLE CHEST 1 VIEW COMPARISON:  Chest radiograph 07/14/2012. FINDINGS: Stable cardiac and mediastinal contours. No consolidative pulmonary opacities. No pleural effusion or pneumothorax. IMPRESSION: No acute cardiopulmonary process. Electronically Signed   By: Lovey Newcomer M.D.   On: 09/14/2015 18:08    Microbiology: No results found for this or any previous visit (from the past 240 hour(s)).   Labs: Basic Metabolic Panel:  Recent Labs Lab 09/14/15 1539 09/14/15 1546 09/16/15 0441  NA 138 135 132*  K 3.2* 3.1* 3.6  CL 91* 93* 89*  CO2 22  --  30  GLUCOSE 123* 119* 110*  BUN 54* 48* 58*  CREATININE 10.08* 9.30* 10.05*  CALCIUM 9.7  --  8.2*  PHOS  --   --  4.2   Liver Function Tests:  Recent Labs Lab 09/14/15 1539 09/16/15 0441  AST 24 14*  ALT 9* 9*  ALKPHOS 172* 152*  BILITOT 0.4 0.4  PROT  7.3 6.1*  ALBUMIN 3.6 2.8*   No results for input(s): LIPASE, AMYLASE in the last 168 hours.  Recent Labs Lab 09/14/15 1539  AMMONIA 65*   CBC:  Recent Labs Lab 09/14/15 1539 09/14/15 1546 09/16/15 0441  WBC 12.4*  --  7.9  NEUTROABS 9.6*  --   --   HGB 10.4* 12.2 9.2*  HCT 32.7* 36.0 27.6*  MCV 97.9  --  95.5  PLT 421*  --  339   Cardiac Enzymes: No results for input(s): CKTOTAL, CKMB, CKMBINDEX, TROPONINI in the last 168 hours. BNP: BNP (last 3 results) No results for input(s): BNP in the last 8760 hours.  ProBNP (last 3 results) No results for input(s): PROBNP in the last 8760 hours.  CBG:  Recent Labs Lab 09/14/15 1537 09/16/15 0820  GLUCAP 118* 100*        Signed:  Charlynne Cousins  [facility] 09/17/2015, 4:09 PM

## 2015-09-17 NOTE — Plan of Care (Signed)
Problem: Safety: Goal: Verbalization of understanding the information provided will improve Outcome: Adequate for Discharge Mentally retarded

## 2015-09-17 NOTE — Progress Notes (Signed)
Subjective: Interval History: crying , prob getting fluid overnight?? Why. Only 910 out this am.  Objective: Vital signs in last 24 hours: Temp:  [97.7 F (36.5 C)-98.7 F (37.1 C)] 98.4 F (36.9 C) (11/25 0839) Pulse Rate:  [93-115] 115 (11/25 0839) Resp:  [16-18] 16 (11/25 0839) BP: (108-123)/(62-74) 113/74 mmHg (11/25 0839) SpO2:  [94 %-98 %] 94 % (11/25 0839) Weight:  [71 kg (156 lb 8.4 oz)] 71 kg (156 lb 8.4 oz) (11/24 2029) Weight change: 1.3 kg (2 lb 13.9 oz)  Intake/Output from previous day: 11/24 0701 - 11/25 0700 In: 720 [P.O.:720] Out: 525 [Urine:525] Intake/Output this shift: Total I/O In: 120 [P.O.:120] Out: -   General appearance: uncooperative and crying emotional Resp: clear to auscultation bilaterally Cardio: S1, S2 normal and systolic murmur: holosystolic 2/6, blowing at apex GI: pos bs, liver down 4 cm, PD cath L mid abdm Extremities: extremities normal, atraumatic, no cyanosis or edema  Lab Results:  Recent Labs  09/14/15 1539 09/14/15 1546 09/16/15 0441  WBC 12.4*  --  7.9  HGB 10.4* 12.2 9.2*  HCT 32.7* 36.0 27.6*  PLT 421*  --  339   BMET:  Recent Labs  09/14/15 1539 09/14/15 1546 09/16/15 0441  NA 138 135 132*  K 3.2* 3.1* 3.6  CL 91* 93* 89*  CO2 22  --  30  GLUCOSE 123* 119* 110*  BUN 54* 48* 58*  CREATININE 10.08* 9.30* 10.05*  CALCIUM 9.7  --  8.2*   No results for input(s): PTH in the last 72 hours. Iron Studies: No results for input(s): IRON, TIBC, TRANSFERRIN, FERRITIN in the last 72 hours.  Studies/Results: No results found.  I have reviewed the patient's current medications.  Assessment/Plan: 1 ESRD absorbed fluid with long dwell. Resume q 8 h, vol ok. bp ok, mechanics ok 2 Anemia 3 HPth 4 Schizoaffective disorder 5 MR 6 CP P tid exchanges, 1/5%    LOS: 3 days   Jalicia Roszak L 09/17/2015,11:57 AM

## 2015-09-17 NOTE — Progress Notes (Signed)
Patient's family member called regarding the medication "Vimpat" as it should be approved by the insurance and pharmacy was unable to refill it.  Called the inpatient pharmacy to see if anything can do, pharmacist said that she will try to page the doctor and call me back.

## 2015-09-17 NOTE — Progress Notes (Addendum)
Unit currently out of stock of 1.5% low Mg, low Ca fluid for peritoneal dialysis.  Materials management called and stated that hospital has run out of 1.5% fluid.  Dr. Joelyn Oms notified of situation.  Verbal orders received to hold this morning's exchange (0200) until needed PD fluid becomes available later in the morning. Will notify patient and patient's family member.

## 2015-09-22 DIAGNOSIS — N186 End stage renal disease: Secondary | ICD-10-CM | POA: Diagnosis not present

## 2015-09-22 DIAGNOSIS — Z992 Dependence on renal dialysis: Secondary | ICD-10-CM | POA: Diagnosis not present

## 2015-09-23 DIAGNOSIS — N186 End stage renal disease: Secondary | ICD-10-CM | POA: Diagnosis not present

## 2015-09-23 DIAGNOSIS — D631 Anemia in chronic kidney disease: Secondary | ICD-10-CM | POA: Diagnosis not present

## 2015-09-23 DIAGNOSIS — N2581 Secondary hyperparathyroidism of renal origin: Secondary | ICD-10-CM | POA: Diagnosis not present

## 2015-09-24 DIAGNOSIS — N186 End stage renal disease: Secondary | ICD-10-CM | POA: Diagnosis not present

## 2015-09-24 DIAGNOSIS — D631 Anemia in chronic kidney disease: Secondary | ICD-10-CM | POA: Diagnosis not present

## 2015-09-24 DIAGNOSIS — N2581 Secondary hyperparathyroidism of renal origin: Secondary | ICD-10-CM | POA: Diagnosis not present

## 2015-09-25 DIAGNOSIS — D631 Anemia in chronic kidney disease: Secondary | ICD-10-CM | POA: Diagnosis not present

## 2015-09-25 DIAGNOSIS — N186 End stage renal disease: Secondary | ICD-10-CM | POA: Diagnosis not present

## 2015-09-25 DIAGNOSIS — N2581 Secondary hyperparathyroidism of renal origin: Secondary | ICD-10-CM | POA: Diagnosis not present

## 2015-09-26 DIAGNOSIS — N2581 Secondary hyperparathyroidism of renal origin: Secondary | ICD-10-CM | POA: Diagnosis not present

## 2015-09-26 DIAGNOSIS — D631 Anemia in chronic kidney disease: Secondary | ICD-10-CM | POA: Diagnosis not present

## 2015-09-26 DIAGNOSIS — N186 End stage renal disease: Secondary | ICD-10-CM | POA: Diagnosis not present

## 2015-09-27 DIAGNOSIS — D631 Anemia in chronic kidney disease: Secondary | ICD-10-CM | POA: Diagnosis not present

## 2015-09-27 DIAGNOSIS — N2581 Secondary hyperparathyroidism of renal origin: Secondary | ICD-10-CM | POA: Diagnosis not present

## 2015-09-27 DIAGNOSIS — N186 End stage renal disease: Secondary | ICD-10-CM | POA: Diagnosis not present

## 2015-09-28 DIAGNOSIS — N2581 Secondary hyperparathyroidism of renal origin: Secondary | ICD-10-CM | POA: Diagnosis not present

## 2015-09-28 DIAGNOSIS — D631 Anemia in chronic kidney disease: Secondary | ICD-10-CM | POA: Diagnosis not present

## 2015-09-28 DIAGNOSIS — N186 End stage renal disease: Secondary | ICD-10-CM | POA: Diagnosis not present

## 2015-09-29 DIAGNOSIS — N186 End stage renal disease: Secondary | ICD-10-CM | POA: Diagnosis not present

## 2015-09-29 DIAGNOSIS — D631 Anemia in chronic kidney disease: Secondary | ICD-10-CM | POA: Diagnosis not present

## 2015-09-29 DIAGNOSIS — N2581 Secondary hyperparathyroidism of renal origin: Secondary | ICD-10-CM | POA: Diagnosis not present

## 2015-09-30 DIAGNOSIS — N186 End stage renal disease: Secondary | ICD-10-CM | POA: Diagnosis not present

## 2015-09-30 DIAGNOSIS — N2581 Secondary hyperparathyroidism of renal origin: Secondary | ICD-10-CM | POA: Diagnosis not present

## 2015-09-30 DIAGNOSIS — D631 Anemia in chronic kidney disease: Secondary | ICD-10-CM | POA: Diagnosis not present

## 2015-10-01 DIAGNOSIS — N2581 Secondary hyperparathyroidism of renal origin: Secondary | ICD-10-CM | POA: Diagnosis not present

## 2015-10-01 DIAGNOSIS — D631 Anemia in chronic kidney disease: Secondary | ICD-10-CM | POA: Diagnosis not present

## 2015-10-01 DIAGNOSIS — N186 End stage renal disease: Secondary | ICD-10-CM | POA: Diagnosis not present

## 2015-10-02 DIAGNOSIS — N186 End stage renal disease: Secondary | ICD-10-CM | POA: Diagnosis not present

## 2015-10-02 DIAGNOSIS — N2581 Secondary hyperparathyroidism of renal origin: Secondary | ICD-10-CM | POA: Diagnosis not present

## 2015-10-02 DIAGNOSIS — D631 Anemia in chronic kidney disease: Secondary | ICD-10-CM | POA: Diagnosis not present

## 2015-10-03 DIAGNOSIS — D631 Anemia in chronic kidney disease: Secondary | ICD-10-CM | POA: Diagnosis not present

## 2015-10-03 DIAGNOSIS — N186 End stage renal disease: Secondary | ICD-10-CM | POA: Diagnosis not present

## 2015-10-03 DIAGNOSIS — N2581 Secondary hyperparathyroidism of renal origin: Secondary | ICD-10-CM | POA: Diagnosis not present

## 2015-10-04 DIAGNOSIS — N2581 Secondary hyperparathyroidism of renal origin: Secondary | ICD-10-CM | POA: Diagnosis not present

## 2015-10-04 DIAGNOSIS — D631 Anemia in chronic kidney disease: Secondary | ICD-10-CM | POA: Diagnosis not present

## 2015-10-04 DIAGNOSIS — Z01818 Encounter for other preprocedural examination: Secondary | ICD-10-CM | POA: Diagnosis not present

## 2015-10-04 DIAGNOSIS — N186 End stage renal disease: Secondary | ICD-10-CM | POA: Diagnosis not present

## 2015-10-04 DIAGNOSIS — R000 Tachycardia, unspecified: Secondary | ICD-10-CM | POA: Diagnosis not present

## 2015-10-04 DIAGNOSIS — R9431 Abnormal electrocardiogram [ECG] [EKG]: Secondary | ICD-10-CM | POA: Diagnosis not present

## 2015-10-05 DIAGNOSIS — D631 Anemia in chronic kidney disease: Secondary | ICD-10-CM | POA: Diagnosis not present

## 2015-10-05 DIAGNOSIS — N186 End stage renal disease: Secondary | ICD-10-CM | POA: Diagnosis not present

## 2015-10-05 DIAGNOSIS — I10 Essential (primary) hypertension: Secondary | ICD-10-CM | POA: Diagnosis not present

## 2015-10-05 DIAGNOSIS — E785 Hyperlipidemia, unspecified: Secondary | ICD-10-CM | POA: Diagnosis not present

## 2015-10-05 DIAGNOSIS — R569 Unspecified convulsions: Secondary | ICD-10-CM | POA: Diagnosis not present

## 2015-10-05 DIAGNOSIS — I119 Hypertensive heart disease without heart failure: Secondary | ICD-10-CM | POA: Diagnosis not present

## 2015-10-05 DIAGNOSIS — N2581 Secondary hyperparathyroidism of renal origin: Secondary | ICD-10-CM | POA: Diagnosis not present

## 2015-10-05 DIAGNOSIS — N3281 Overactive bladder: Secondary | ICD-10-CM | POA: Diagnosis not present

## 2015-10-05 DIAGNOSIS — M109 Gout, unspecified: Secondary | ICD-10-CM | POA: Diagnosis not present

## 2015-10-05 DIAGNOSIS — E039 Hypothyroidism, unspecified: Secondary | ICD-10-CM | POA: Diagnosis not present

## 2015-10-06 DIAGNOSIS — D631 Anemia in chronic kidney disease: Secondary | ICD-10-CM | POA: Diagnosis not present

## 2015-10-06 DIAGNOSIS — N2581 Secondary hyperparathyroidism of renal origin: Secondary | ICD-10-CM | POA: Diagnosis not present

## 2015-10-06 DIAGNOSIS — N186 End stage renal disease: Secondary | ICD-10-CM | POA: Diagnosis not present

## 2015-10-07 DIAGNOSIS — D631 Anemia in chronic kidney disease: Secondary | ICD-10-CM | POA: Diagnosis not present

## 2015-10-07 DIAGNOSIS — N186 End stage renal disease: Secondary | ICD-10-CM | POA: Diagnosis not present

## 2015-10-07 DIAGNOSIS — N2581 Secondary hyperparathyroidism of renal origin: Secondary | ICD-10-CM | POA: Diagnosis not present

## 2015-10-07 DIAGNOSIS — F2081 Schizophreniform disorder: Secondary | ICD-10-CM | POA: Diagnosis not present

## 2015-10-08 DIAGNOSIS — N2581 Secondary hyperparathyroidism of renal origin: Secondary | ICD-10-CM | POA: Diagnosis not present

## 2015-10-08 DIAGNOSIS — D631 Anemia in chronic kidney disease: Secondary | ICD-10-CM | POA: Diagnosis not present

## 2015-10-08 DIAGNOSIS — N186 End stage renal disease: Secondary | ICD-10-CM | POA: Diagnosis not present

## 2015-10-09 DIAGNOSIS — N186 End stage renal disease: Secondary | ICD-10-CM | POA: Diagnosis not present

## 2015-10-09 DIAGNOSIS — N2581 Secondary hyperparathyroidism of renal origin: Secondary | ICD-10-CM | POA: Diagnosis not present

## 2015-10-09 DIAGNOSIS — D631 Anemia in chronic kidney disease: Secondary | ICD-10-CM | POA: Diagnosis not present

## 2015-10-10 DIAGNOSIS — N186 End stage renal disease: Secondary | ICD-10-CM | POA: Diagnosis not present

## 2015-10-10 DIAGNOSIS — D631 Anemia in chronic kidney disease: Secondary | ICD-10-CM | POA: Diagnosis not present

## 2015-10-10 DIAGNOSIS — N2581 Secondary hyperparathyroidism of renal origin: Secondary | ICD-10-CM | POA: Diagnosis not present

## 2015-10-11 DIAGNOSIS — N186 End stage renal disease: Secondary | ICD-10-CM | POA: Diagnosis not present

## 2015-10-11 DIAGNOSIS — N2581 Secondary hyperparathyroidism of renal origin: Secondary | ICD-10-CM | POA: Diagnosis not present

## 2015-10-11 DIAGNOSIS — D631 Anemia in chronic kidney disease: Secondary | ICD-10-CM | POA: Diagnosis not present

## 2015-10-12 DIAGNOSIS — D631 Anemia in chronic kidney disease: Secondary | ICD-10-CM | POA: Diagnosis not present

## 2015-10-12 DIAGNOSIS — N186 End stage renal disease: Secondary | ICD-10-CM | POA: Diagnosis not present

## 2015-10-12 DIAGNOSIS — N2581 Secondary hyperparathyroidism of renal origin: Secondary | ICD-10-CM | POA: Diagnosis not present

## 2015-10-13 DIAGNOSIS — D631 Anemia in chronic kidney disease: Secondary | ICD-10-CM | POA: Diagnosis not present

## 2015-10-13 DIAGNOSIS — N2581 Secondary hyperparathyroidism of renal origin: Secondary | ICD-10-CM | POA: Diagnosis not present

## 2015-10-13 DIAGNOSIS — N186 End stage renal disease: Secondary | ICD-10-CM | POA: Diagnosis not present

## 2015-10-14 ENCOUNTER — Other Ambulatory Visit: Payer: Self-pay

## 2015-10-14 DIAGNOSIS — D631 Anemia in chronic kidney disease: Secondary | ICD-10-CM | POA: Diagnosis not present

## 2015-10-14 DIAGNOSIS — Z1231 Encounter for screening mammogram for malignant neoplasm of breast: Secondary | ICD-10-CM

## 2015-10-14 DIAGNOSIS — N2581 Secondary hyperparathyroidism of renal origin: Secondary | ICD-10-CM | POA: Diagnosis not present

## 2015-10-14 DIAGNOSIS — N186 End stage renal disease: Secondary | ICD-10-CM | POA: Diagnosis not present

## 2015-10-15 DIAGNOSIS — N186 End stage renal disease: Secondary | ICD-10-CM | POA: Diagnosis not present

## 2015-10-15 DIAGNOSIS — D631 Anemia in chronic kidney disease: Secondary | ICD-10-CM | POA: Diagnosis not present

## 2015-10-15 DIAGNOSIS — N2581 Secondary hyperparathyroidism of renal origin: Secondary | ICD-10-CM | POA: Diagnosis not present

## 2015-10-16 DIAGNOSIS — N186 End stage renal disease: Secondary | ICD-10-CM | POA: Diagnosis not present

## 2015-10-16 DIAGNOSIS — D631 Anemia in chronic kidney disease: Secondary | ICD-10-CM | POA: Diagnosis not present

## 2015-10-16 DIAGNOSIS — N2581 Secondary hyperparathyroidism of renal origin: Secondary | ICD-10-CM | POA: Diagnosis not present

## 2015-10-17 DIAGNOSIS — N186 End stage renal disease: Secondary | ICD-10-CM | POA: Diagnosis not present

## 2015-10-17 DIAGNOSIS — N2581 Secondary hyperparathyroidism of renal origin: Secondary | ICD-10-CM | POA: Diagnosis not present

## 2015-10-17 DIAGNOSIS — D631 Anemia in chronic kidney disease: Secondary | ICD-10-CM | POA: Diagnosis not present

## 2015-10-18 DIAGNOSIS — D631 Anemia in chronic kidney disease: Secondary | ICD-10-CM | POA: Diagnosis not present

## 2015-10-18 DIAGNOSIS — N186 End stage renal disease: Secondary | ICD-10-CM | POA: Diagnosis not present

## 2015-10-18 DIAGNOSIS — N2581 Secondary hyperparathyroidism of renal origin: Secondary | ICD-10-CM | POA: Diagnosis not present

## 2015-10-19 DIAGNOSIS — N186 End stage renal disease: Secondary | ICD-10-CM | POA: Diagnosis not present

## 2015-10-19 DIAGNOSIS — N2581 Secondary hyperparathyroidism of renal origin: Secondary | ICD-10-CM | POA: Diagnosis not present

## 2015-10-19 DIAGNOSIS — D631 Anemia in chronic kidney disease: Secondary | ICD-10-CM | POA: Diagnosis not present

## 2015-10-20 DIAGNOSIS — D631 Anemia in chronic kidney disease: Secondary | ICD-10-CM | POA: Diagnosis not present

## 2015-10-20 DIAGNOSIS — N2581 Secondary hyperparathyroidism of renal origin: Secondary | ICD-10-CM | POA: Diagnosis not present

## 2015-10-20 DIAGNOSIS — N186 End stage renal disease: Secondary | ICD-10-CM | POA: Diagnosis not present

## 2015-10-21 DIAGNOSIS — N186 End stage renal disease: Secondary | ICD-10-CM | POA: Diagnosis not present

## 2015-10-21 DIAGNOSIS — D631 Anemia in chronic kidney disease: Secondary | ICD-10-CM | POA: Diagnosis not present

## 2015-10-21 DIAGNOSIS — N2581 Secondary hyperparathyroidism of renal origin: Secondary | ICD-10-CM | POA: Diagnosis not present

## 2015-10-22 DIAGNOSIS — D631 Anemia in chronic kidney disease: Secondary | ICD-10-CM | POA: Diagnosis not present

## 2015-10-22 DIAGNOSIS — N186 End stage renal disease: Secondary | ICD-10-CM | POA: Diagnosis not present

## 2015-10-22 DIAGNOSIS — N2581 Secondary hyperparathyroidism of renal origin: Secondary | ICD-10-CM | POA: Diagnosis not present

## 2015-10-23 DIAGNOSIS — N2581 Secondary hyperparathyroidism of renal origin: Secondary | ICD-10-CM | POA: Diagnosis not present

## 2015-10-23 DIAGNOSIS — N186 End stage renal disease: Secondary | ICD-10-CM | POA: Diagnosis not present

## 2015-10-23 DIAGNOSIS — D631 Anemia in chronic kidney disease: Secondary | ICD-10-CM | POA: Diagnosis not present

## 2015-10-23 DIAGNOSIS — Z992 Dependence on renal dialysis: Secondary | ICD-10-CM | POA: Diagnosis not present

## 2015-10-24 DIAGNOSIS — N186 End stage renal disease: Secondary | ICD-10-CM | POA: Diagnosis not present

## 2015-10-24 DIAGNOSIS — N2581 Secondary hyperparathyroidism of renal origin: Secondary | ICD-10-CM | POA: Diagnosis not present

## 2015-10-24 DIAGNOSIS — D631 Anemia in chronic kidney disease: Secondary | ICD-10-CM | POA: Diagnosis not present

## 2015-10-25 DIAGNOSIS — N2581 Secondary hyperparathyroidism of renal origin: Secondary | ICD-10-CM | POA: Diagnosis not present

## 2015-10-25 DIAGNOSIS — N186 End stage renal disease: Secondary | ICD-10-CM | POA: Diagnosis not present

## 2015-10-25 DIAGNOSIS — D631 Anemia in chronic kidney disease: Secondary | ICD-10-CM | POA: Diagnosis not present

## 2015-10-26 DIAGNOSIS — N2581 Secondary hyperparathyroidism of renal origin: Secondary | ICD-10-CM | POA: Diagnosis not present

## 2015-10-26 DIAGNOSIS — N186 End stage renal disease: Secondary | ICD-10-CM | POA: Diagnosis not present

## 2015-10-26 DIAGNOSIS — D631 Anemia in chronic kidney disease: Secondary | ICD-10-CM | POA: Diagnosis not present

## 2015-10-27 DIAGNOSIS — N186 End stage renal disease: Secondary | ICD-10-CM | POA: Diagnosis not present

## 2015-10-27 DIAGNOSIS — N2581 Secondary hyperparathyroidism of renal origin: Secondary | ICD-10-CM | POA: Diagnosis not present

## 2015-10-27 DIAGNOSIS — D631 Anemia in chronic kidney disease: Secondary | ICD-10-CM | POA: Diagnosis not present

## 2015-10-28 DIAGNOSIS — D631 Anemia in chronic kidney disease: Secondary | ICD-10-CM | POA: Diagnosis not present

## 2015-10-28 DIAGNOSIS — N2581 Secondary hyperparathyroidism of renal origin: Secondary | ICD-10-CM | POA: Diagnosis not present

## 2015-10-28 DIAGNOSIS — N186 End stage renal disease: Secondary | ICD-10-CM | POA: Diagnosis not present

## 2015-10-29 DIAGNOSIS — D631 Anemia in chronic kidney disease: Secondary | ICD-10-CM | POA: Diagnosis not present

## 2015-10-29 DIAGNOSIS — N2581 Secondary hyperparathyroidism of renal origin: Secondary | ICD-10-CM | POA: Diagnosis not present

## 2015-10-29 DIAGNOSIS — N186 End stage renal disease: Secondary | ICD-10-CM | POA: Diagnosis not present

## 2015-10-30 DIAGNOSIS — N186 End stage renal disease: Secondary | ICD-10-CM | POA: Diagnosis not present

## 2015-10-30 DIAGNOSIS — D631 Anemia in chronic kidney disease: Secondary | ICD-10-CM | POA: Diagnosis not present

## 2015-10-30 DIAGNOSIS — N2581 Secondary hyperparathyroidism of renal origin: Secondary | ICD-10-CM | POA: Diagnosis not present

## 2015-10-31 DIAGNOSIS — N186 End stage renal disease: Secondary | ICD-10-CM | POA: Diagnosis not present

## 2015-10-31 DIAGNOSIS — N2581 Secondary hyperparathyroidism of renal origin: Secondary | ICD-10-CM | POA: Diagnosis not present

## 2015-10-31 DIAGNOSIS — D631 Anemia in chronic kidney disease: Secondary | ICD-10-CM | POA: Diagnosis not present

## 2015-11-01 DIAGNOSIS — D631 Anemia in chronic kidney disease: Secondary | ICD-10-CM | POA: Diagnosis not present

## 2015-11-01 DIAGNOSIS — N186 End stage renal disease: Secondary | ICD-10-CM | POA: Diagnosis not present

## 2015-11-01 DIAGNOSIS — N2581 Secondary hyperparathyroidism of renal origin: Secondary | ICD-10-CM | POA: Diagnosis not present

## 2015-11-02 DIAGNOSIS — N186 End stage renal disease: Secondary | ICD-10-CM | POA: Diagnosis not present

## 2015-11-02 DIAGNOSIS — N2581 Secondary hyperparathyroidism of renal origin: Secondary | ICD-10-CM | POA: Diagnosis not present

## 2015-11-02 DIAGNOSIS — D631 Anemia in chronic kidney disease: Secondary | ICD-10-CM | POA: Diagnosis not present

## 2015-11-03 DIAGNOSIS — D631 Anemia in chronic kidney disease: Secondary | ICD-10-CM | POA: Diagnosis not present

## 2015-11-03 DIAGNOSIS — N2581 Secondary hyperparathyroidism of renal origin: Secondary | ICD-10-CM | POA: Diagnosis not present

## 2015-11-03 DIAGNOSIS — N186 End stage renal disease: Secondary | ICD-10-CM | POA: Diagnosis not present

## 2015-11-04 DIAGNOSIS — N186 End stage renal disease: Secondary | ICD-10-CM | POA: Diagnosis not present

## 2015-11-04 DIAGNOSIS — D631 Anemia in chronic kidney disease: Secondary | ICD-10-CM | POA: Diagnosis not present

## 2015-11-04 DIAGNOSIS — N2581 Secondary hyperparathyroidism of renal origin: Secondary | ICD-10-CM | POA: Diagnosis not present

## 2015-11-05 DIAGNOSIS — D631 Anemia in chronic kidney disease: Secondary | ICD-10-CM | POA: Diagnosis not present

## 2015-11-05 DIAGNOSIS — N186 End stage renal disease: Secondary | ICD-10-CM | POA: Diagnosis not present

## 2015-11-05 DIAGNOSIS — N2581 Secondary hyperparathyroidism of renal origin: Secondary | ICD-10-CM | POA: Diagnosis not present

## 2015-11-06 DIAGNOSIS — D631 Anemia in chronic kidney disease: Secondary | ICD-10-CM | POA: Diagnosis not present

## 2015-11-06 DIAGNOSIS — N186 End stage renal disease: Secondary | ICD-10-CM | POA: Diagnosis not present

## 2015-11-06 DIAGNOSIS — N2581 Secondary hyperparathyroidism of renal origin: Secondary | ICD-10-CM | POA: Diagnosis not present

## 2015-11-07 DIAGNOSIS — N2581 Secondary hyperparathyroidism of renal origin: Secondary | ICD-10-CM | POA: Diagnosis not present

## 2015-11-07 DIAGNOSIS — N186 End stage renal disease: Secondary | ICD-10-CM | POA: Diagnosis not present

## 2015-11-07 DIAGNOSIS — D631 Anemia in chronic kidney disease: Secondary | ICD-10-CM | POA: Diagnosis not present

## 2015-11-08 DIAGNOSIS — D631 Anemia in chronic kidney disease: Secondary | ICD-10-CM | POA: Diagnosis not present

## 2015-11-08 DIAGNOSIS — N186 End stage renal disease: Secondary | ICD-10-CM | POA: Diagnosis not present

## 2015-11-08 DIAGNOSIS — N2581 Secondary hyperparathyroidism of renal origin: Secondary | ICD-10-CM | POA: Diagnosis not present

## 2015-11-09 DIAGNOSIS — D631 Anemia in chronic kidney disease: Secondary | ICD-10-CM | POA: Diagnosis not present

## 2015-11-09 DIAGNOSIS — N2581 Secondary hyperparathyroidism of renal origin: Secondary | ICD-10-CM | POA: Diagnosis not present

## 2015-11-09 DIAGNOSIS — N186 End stage renal disease: Secondary | ICD-10-CM | POA: Diagnosis not present

## 2015-11-10 DIAGNOSIS — D631 Anemia in chronic kidney disease: Secondary | ICD-10-CM | POA: Diagnosis not present

## 2015-11-10 DIAGNOSIS — N2581 Secondary hyperparathyroidism of renal origin: Secondary | ICD-10-CM | POA: Diagnosis not present

## 2015-11-10 DIAGNOSIS — N186 End stage renal disease: Secondary | ICD-10-CM | POA: Diagnosis not present

## 2015-11-11 DIAGNOSIS — N186 End stage renal disease: Secondary | ICD-10-CM | POA: Diagnosis not present

## 2015-11-11 DIAGNOSIS — N2581 Secondary hyperparathyroidism of renal origin: Secondary | ICD-10-CM | POA: Diagnosis not present

## 2015-11-11 DIAGNOSIS — D631 Anemia in chronic kidney disease: Secondary | ICD-10-CM | POA: Diagnosis not present

## 2015-11-12 DIAGNOSIS — N186 End stage renal disease: Secondary | ICD-10-CM | POA: Diagnosis not present

## 2015-11-12 DIAGNOSIS — D631 Anemia in chronic kidney disease: Secondary | ICD-10-CM | POA: Diagnosis not present

## 2015-11-12 DIAGNOSIS — N2581 Secondary hyperparathyroidism of renal origin: Secondary | ICD-10-CM | POA: Diagnosis not present

## 2015-11-13 DIAGNOSIS — N2581 Secondary hyperparathyroidism of renal origin: Secondary | ICD-10-CM | POA: Diagnosis not present

## 2015-11-13 DIAGNOSIS — D631 Anemia in chronic kidney disease: Secondary | ICD-10-CM | POA: Diagnosis not present

## 2015-11-13 DIAGNOSIS — N186 End stage renal disease: Secondary | ICD-10-CM | POA: Diagnosis not present

## 2015-11-14 DIAGNOSIS — N2581 Secondary hyperparathyroidism of renal origin: Secondary | ICD-10-CM | POA: Diagnosis not present

## 2015-11-14 DIAGNOSIS — N186 End stage renal disease: Secondary | ICD-10-CM | POA: Diagnosis not present

## 2015-11-14 DIAGNOSIS — D631 Anemia in chronic kidney disease: Secondary | ICD-10-CM | POA: Diagnosis not present

## 2015-11-15 DIAGNOSIS — D631 Anemia in chronic kidney disease: Secondary | ICD-10-CM | POA: Diagnosis not present

## 2015-11-15 DIAGNOSIS — N186 End stage renal disease: Secondary | ICD-10-CM | POA: Diagnosis not present

## 2015-11-15 DIAGNOSIS — N2581 Secondary hyperparathyroidism of renal origin: Secondary | ICD-10-CM | POA: Diagnosis not present

## 2015-11-16 DIAGNOSIS — N2581 Secondary hyperparathyroidism of renal origin: Secondary | ICD-10-CM | POA: Diagnosis not present

## 2015-11-16 DIAGNOSIS — D631 Anemia in chronic kidney disease: Secondary | ICD-10-CM | POA: Diagnosis not present

## 2015-11-16 DIAGNOSIS — N186 End stage renal disease: Secondary | ICD-10-CM | POA: Diagnosis not present

## 2015-11-17 DIAGNOSIS — N186 End stage renal disease: Secondary | ICD-10-CM | POA: Diagnosis not present

## 2015-11-17 DIAGNOSIS — D631 Anemia in chronic kidney disease: Secondary | ICD-10-CM | POA: Diagnosis not present

## 2015-11-17 DIAGNOSIS — N2581 Secondary hyperparathyroidism of renal origin: Secondary | ICD-10-CM | POA: Diagnosis not present

## 2015-11-18 DIAGNOSIS — N2581 Secondary hyperparathyroidism of renal origin: Secondary | ICD-10-CM | POA: Diagnosis not present

## 2015-11-18 DIAGNOSIS — D631 Anemia in chronic kidney disease: Secondary | ICD-10-CM | POA: Diagnosis not present

## 2015-11-18 DIAGNOSIS — N186 End stage renal disease: Secondary | ICD-10-CM | POA: Diagnosis not present

## 2015-11-19 DIAGNOSIS — N2581 Secondary hyperparathyroidism of renal origin: Secondary | ICD-10-CM | POA: Diagnosis not present

## 2015-11-19 DIAGNOSIS — N186 End stage renal disease: Secondary | ICD-10-CM | POA: Diagnosis not present

## 2015-11-19 DIAGNOSIS — D631 Anemia in chronic kidney disease: Secondary | ICD-10-CM | POA: Diagnosis not present

## 2015-11-20 DIAGNOSIS — D631 Anemia in chronic kidney disease: Secondary | ICD-10-CM | POA: Diagnosis not present

## 2015-11-20 DIAGNOSIS — N2581 Secondary hyperparathyroidism of renal origin: Secondary | ICD-10-CM | POA: Diagnosis not present

## 2015-11-20 DIAGNOSIS — N186 End stage renal disease: Secondary | ICD-10-CM | POA: Diagnosis not present

## 2015-11-21 DIAGNOSIS — N2581 Secondary hyperparathyroidism of renal origin: Secondary | ICD-10-CM | POA: Diagnosis not present

## 2015-11-21 DIAGNOSIS — D631 Anemia in chronic kidney disease: Secondary | ICD-10-CM | POA: Diagnosis not present

## 2015-11-21 DIAGNOSIS — N186 End stage renal disease: Secondary | ICD-10-CM | POA: Diagnosis not present

## 2015-11-22 ENCOUNTER — Ambulatory Visit
Admission: RE | Admit: 2015-11-22 | Discharge: 2015-11-22 | Disposition: A | Discharge status: Home / Self Care | Payer: Medicare Other | Source: Ambulatory Visit

## 2015-11-22 DIAGNOSIS — N186 End stage renal disease: Secondary | ICD-10-CM | POA: Diagnosis not present

## 2015-11-22 DIAGNOSIS — Z1231 Encounter for screening mammogram for malignant neoplasm of breast: Secondary | ICD-10-CM

## 2015-11-22 DIAGNOSIS — N2581 Secondary hyperparathyroidism of renal origin: Secondary | ICD-10-CM | POA: Diagnosis not present

## 2015-11-22 DIAGNOSIS — D631 Anemia in chronic kidney disease: Secondary | ICD-10-CM | POA: Diagnosis not present

## 2015-11-23 DIAGNOSIS — N2581 Secondary hyperparathyroidism of renal origin: Secondary | ICD-10-CM | POA: Diagnosis not present

## 2015-11-23 DIAGNOSIS — N186 End stage renal disease: Secondary | ICD-10-CM | POA: Diagnosis not present

## 2015-11-23 DIAGNOSIS — Z992 Dependence on renal dialysis: Secondary | ICD-10-CM | POA: Diagnosis not present

## 2015-11-23 DIAGNOSIS — D631 Anemia in chronic kidney disease: Secondary | ICD-10-CM | POA: Diagnosis not present

## 2015-11-24 DIAGNOSIS — N2581 Secondary hyperparathyroidism of renal origin: Secondary | ICD-10-CM | POA: Diagnosis not present

## 2015-11-24 DIAGNOSIS — D631 Anemia in chronic kidney disease: Secondary | ICD-10-CM | POA: Diagnosis not present

## 2015-11-24 DIAGNOSIS — N186 End stage renal disease: Secondary | ICD-10-CM | POA: Diagnosis not present

## 2015-12-14 DIAGNOSIS — Z1159 Encounter for screening for other viral diseases: Secondary | ICD-10-CM | POA: Diagnosis not present

## 2015-12-21 DIAGNOSIS — N186 End stage renal disease: Secondary | ICD-10-CM | POA: Diagnosis not present

## 2015-12-21 DIAGNOSIS — Z992 Dependence on renal dialysis: Secondary | ICD-10-CM | POA: Diagnosis not present

## 2015-12-22 DIAGNOSIS — N186 End stage renal disease: Secondary | ICD-10-CM | POA: Diagnosis not present

## 2015-12-22 DIAGNOSIS — N2581 Secondary hyperparathyroidism of renal origin: Secondary | ICD-10-CM | POA: Diagnosis not present

## 2015-12-22 DIAGNOSIS — D631 Anemia in chronic kidney disease: Secondary | ICD-10-CM | POA: Diagnosis not present

## 2015-12-28 DIAGNOSIS — F2081 Schizophreniform disorder: Secondary | ICD-10-CM | POA: Diagnosis not present

## 2016-01-03 DIAGNOSIS — N186 End stage renal disease: Secondary | ICD-10-CM | POA: Diagnosis not present

## 2016-01-03 DIAGNOSIS — I10 Essential (primary) hypertension: Secondary | ICD-10-CM | POA: Diagnosis not present

## 2016-01-03 DIAGNOSIS — E039 Hypothyroidism, unspecified: Secondary | ICD-10-CM | POA: Diagnosis not present

## 2016-01-03 DIAGNOSIS — I119 Hypertensive heart disease without heart failure: Secondary | ICD-10-CM | POA: Diagnosis not present

## 2016-01-03 DIAGNOSIS — R569 Unspecified convulsions: Secondary | ICD-10-CM | POA: Diagnosis not present

## 2016-01-03 DIAGNOSIS — N3281 Overactive bladder: Secondary | ICD-10-CM | POA: Diagnosis not present

## 2016-01-03 DIAGNOSIS — M109 Gout, unspecified: Secondary | ICD-10-CM | POA: Diagnosis not present

## 2016-01-03 DIAGNOSIS — E785 Hyperlipidemia, unspecified: Secondary | ICD-10-CM | POA: Diagnosis not present

## 2016-01-21 DIAGNOSIS — N186 End stage renal disease: Secondary | ICD-10-CM | POA: Diagnosis not present

## 2016-01-21 DIAGNOSIS — Z992 Dependence on renal dialysis: Secondary | ICD-10-CM | POA: Diagnosis not present

## 2016-01-22 DIAGNOSIS — D631 Anemia in chronic kidney disease: Secondary | ICD-10-CM | POA: Diagnosis not present

## 2016-01-22 DIAGNOSIS — N186 End stage renal disease: Secondary | ICD-10-CM | POA: Diagnosis not present

## 2016-01-22 DIAGNOSIS — N2581 Secondary hyperparathyroidism of renal origin: Secondary | ICD-10-CM | POA: Diagnosis not present

## 2016-01-23 DIAGNOSIS — D631 Anemia in chronic kidney disease: Secondary | ICD-10-CM | POA: Diagnosis not present

## 2016-01-23 DIAGNOSIS — N186 End stage renal disease: Secondary | ICD-10-CM | POA: Diagnosis not present

## 2016-01-23 DIAGNOSIS — N2581 Secondary hyperparathyroidism of renal origin: Secondary | ICD-10-CM | POA: Diagnosis not present

## 2016-01-24 DIAGNOSIS — N186 End stage renal disease: Secondary | ICD-10-CM | POA: Diagnosis not present

## 2016-01-24 DIAGNOSIS — D631 Anemia in chronic kidney disease: Secondary | ICD-10-CM | POA: Diagnosis not present

## 2016-01-24 DIAGNOSIS — N2581 Secondary hyperparathyroidism of renal origin: Secondary | ICD-10-CM | POA: Diagnosis not present

## 2016-01-25 DIAGNOSIS — N186 End stage renal disease: Secondary | ICD-10-CM | POA: Diagnosis not present

## 2016-01-25 DIAGNOSIS — D631 Anemia in chronic kidney disease: Secondary | ICD-10-CM | POA: Diagnosis not present

## 2016-01-25 DIAGNOSIS — N2581 Secondary hyperparathyroidism of renal origin: Secondary | ICD-10-CM | POA: Diagnosis not present

## 2016-01-26 DIAGNOSIS — D631 Anemia in chronic kidney disease: Secondary | ICD-10-CM | POA: Diagnosis not present

## 2016-01-26 DIAGNOSIS — N186 End stage renal disease: Secondary | ICD-10-CM | POA: Diagnosis not present

## 2016-01-26 DIAGNOSIS — N2581 Secondary hyperparathyroidism of renal origin: Secondary | ICD-10-CM | POA: Diagnosis not present

## 2016-01-27 DIAGNOSIS — N186 End stage renal disease: Secondary | ICD-10-CM | POA: Diagnosis not present

## 2016-01-27 DIAGNOSIS — D631 Anemia in chronic kidney disease: Secondary | ICD-10-CM | POA: Diagnosis not present

## 2016-01-27 DIAGNOSIS — N2581 Secondary hyperparathyroidism of renal origin: Secondary | ICD-10-CM | POA: Diagnosis not present

## 2016-01-28 DIAGNOSIS — N2581 Secondary hyperparathyroidism of renal origin: Secondary | ICD-10-CM | POA: Diagnosis not present

## 2016-01-28 DIAGNOSIS — N186 End stage renal disease: Secondary | ICD-10-CM | POA: Diagnosis not present

## 2016-01-28 DIAGNOSIS — D631 Anemia in chronic kidney disease: Secondary | ICD-10-CM | POA: Diagnosis not present

## 2016-01-29 DIAGNOSIS — N186 End stage renal disease: Secondary | ICD-10-CM | POA: Diagnosis not present

## 2016-01-29 DIAGNOSIS — D631 Anemia in chronic kidney disease: Secondary | ICD-10-CM | POA: Diagnosis not present

## 2016-01-29 DIAGNOSIS — N2581 Secondary hyperparathyroidism of renal origin: Secondary | ICD-10-CM | POA: Diagnosis not present

## 2016-01-30 DIAGNOSIS — N2581 Secondary hyperparathyroidism of renal origin: Secondary | ICD-10-CM | POA: Diagnosis not present

## 2016-01-30 DIAGNOSIS — D631 Anemia in chronic kidney disease: Secondary | ICD-10-CM | POA: Diagnosis not present

## 2016-01-30 DIAGNOSIS — N186 End stage renal disease: Secondary | ICD-10-CM | POA: Diagnosis not present

## 2016-01-31 DIAGNOSIS — D631 Anemia in chronic kidney disease: Secondary | ICD-10-CM | POA: Diagnosis not present

## 2016-01-31 DIAGNOSIS — N2581 Secondary hyperparathyroidism of renal origin: Secondary | ICD-10-CM | POA: Diagnosis not present

## 2016-01-31 DIAGNOSIS — N186 End stage renal disease: Secondary | ICD-10-CM | POA: Diagnosis not present

## 2016-02-01 DIAGNOSIS — D631 Anemia in chronic kidney disease: Secondary | ICD-10-CM | POA: Diagnosis not present

## 2016-02-01 DIAGNOSIS — N2581 Secondary hyperparathyroidism of renal origin: Secondary | ICD-10-CM | POA: Diagnosis not present

## 2016-02-01 DIAGNOSIS — N186 End stage renal disease: Secondary | ICD-10-CM | POA: Diagnosis not present

## 2016-02-02 DIAGNOSIS — D631 Anemia in chronic kidney disease: Secondary | ICD-10-CM | POA: Diagnosis not present

## 2016-02-02 DIAGNOSIS — N2581 Secondary hyperparathyroidism of renal origin: Secondary | ICD-10-CM | POA: Diagnosis not present

## 2016-02-02 DIAGNOSIS — N186 End stage renal disease: Secondary | ICD-10-CM | POA: Diagnosis not present

## 2016-02-03 DIAGNOSIS — N2581 Secondary hyperparathyroidism of renal origin: Secondary | ICD-10-CM | POA: Diagnosis not present

## 2016-02-03 DIAGNOSIS — D631 Anemia in chronic kidney disease: Secondary | ICD-10-CM | POA: Diagnosis not present

## 2016-02-03 DIAGNOSIS — N186 End stage renal disease: Secondary | ICD-10-CM | POA: Diagnosis not present

## 2016-02-04 DIAGNOSIS — N186 End stage renal disease: Secondary | ICD-10-CM | POA: Diagnosis not present

## 2016-02-04 DIAGNOSIS — N2581 Secondary hyperparathyroidism of renal origin: Secondary | ICD-10-CM | POA: Diagnosis not present

## 2016-02-04 DIAGNOSIS — D631 Anemia in chronic kidney disease: Secondary | ICD-10-CM | POA: Diagnosis not present

## 2016-02-05 DIAGNOSIS — N186 End stage renal disease: Secondary | ICD-10-CM | POA: Diagnosis not present

## 2016-02-05 DIAGNOSIS — N2581 Secondary hyperparathyroidism of renal origin: Secondary | ICD-10-CM | POA: Diagnosis not present

## 2016-02-05 DIAGNOSIS — D631 Anemia in chronic kidney disease: Secondary | ICD-10-CM | POA: Diagnosis not present

## 2016-02-06 DIAGNOSIS — N186 End stage renal disease: Secondary | ICD-10-CM | POA: Diagnosis not present

## 2016-02-06 DIAGNOSIS — N2581 Secondary hyperparathyroidism of renal origin: Secondary | ICD-10-CM | POA: Diagnosis not present

## 2016-02-06 DIAGNOSIS — D631 Anemia in chronic kidney disease: Secondary | ICD-10-CM | POA: Diagnosis not present

## 2016-02-07 DIAGNOSIS — N186 End stage renal disease: Secondary | ICD-10-CM | POA: Diagnosis not present

## 2016-02-07 DIAGNOSIS — N2581 Secondary hyperparathyroidism of renal origin: Secondary | ICD-10-CM | POA: Diagnosis not present

## 2016-02-07 DIAGNOSIS — D631 Anemia in chronic kidney disease: Secondary | ICD-10-CM | POA: Diagnosis not present

## 2016-02-08 DIAGNOSIS — N2581 Secondary hyperparathyroidism of renal origin: Secondary | ICD-10-CM | POA: Diagnosis not present

## 2016-02-08 DIAGNOSIS — N186 End stage renal disease: Secondary | ICD-10-CM | POA: Diagnosis not present

## 2016-02-08 DIAGNOSIS — D631 Anemia in chronic kidney disease: Secondary | ICD-10-CM | POA: Diagnosis not present

## 2016-02-09 DIAGNOSIS — N186 End stage renal disease: Secondary | ICD-10-CM | POA: Diagnosis not present

## 2016-02-09 DIAGNOSIS — N2581 Secondary hyperparathyroidism of renal origin: Secondary | ICD-10-CM | POA: Diagnosis not present

## 2016-02-09 DIAGNOSIS — D631 Anemia in chronic kidney disease: Secondary | ICD-10-CM | POA: Diagnosis not present

## 2016-02-10 DIAGNOSIS — N186 End stage renal disease: Secondary | ICD-10-CM | POA: Diagnosis not present

## 2016-02-10 DIAGNOSIS — D631 Anemia in chronic kidney disease: Secondary | ICD-10-CM | POA: Diagnosis not present

## 2016-02-10 DIAGNOSIS — N2581 Secondary hyperparathyroidism of renal origin: Secondary | ICD-10-CM | POA: Diagnosis not present

## 2016-02-11 DIAGNOSIS — N2581 Secondary hyperparathyroidism of renal origin: Secondary | ICD-10-CM | POA: Diagnosis not present

## 2016-02-11 DIAGNOSIS — N186 End stage renal disease: Secondary | ICD-10-CM | POA: Diagnosis not present

## 2016-02-11 DIAGNOSIS — D631 Anemia in chronic kidney disease: Secondary | ICD-10-CM | POA: Diagnosis not present

## 2016-02-12 DIAGNOSIS — N2581 Secondary hyperparathyroidism of renal origin: Secondary | ICD-10-CM | POA: Diagnosis not present

## 2016-02-12 DIAGNOSIS — D631 Anemia in chronic kidney disease: Secondary | ICD-10-CM | POA: Diagnosis not present

## 2016-02-12 DIAGNOSIS — N186 End stage renal disease: Secondary | ICD-10-CM | POA: Diagnosis not present

## 2016-02-13 DIAGNOSIS — N2581 Secondary hyperparathyroidism of renal origin: Secondary | ICD-10-CM | POA: Diagnosis not present

## 2016-02-13 DIAGNOSIS — D631 Anemia in chronic kidney disease: Secondary | ICD-10-CM | POA: Diagnosis not present

## 2016-02-13 DIAGNOSIS — N186 End stage renal disease: Secondary | ICD-10-CM | POA: Diagnosis not present

## 2016-02-14 DIAGNOSIS — D631 Anemia in chronic kidney disease: Secondary | ICD-10-CM | POA: Diagnosis not present

## 2016-02-14 DIAGNOSIS — N186 End stage renal disease: Secondary | ICD-10-CM | POA: Diagnosis not present

## 2016-02-14 DIAGNOSIS — N2581 Secondary hyperparathyroidism of renal origin: Secondary | ICD-10-CM | POA: Diagnosis not present

## 2016-02-15 DIAGNOSIS — N186 End stage renal disease: Secondary | ICD-10-CM | POA: Diagnosis not present

## 2016-02-15 DIAGNOSIS — N2581 Secondary hyperparathyroidism of renal origin: Secondary | ICD-10-CM | POA: Diagnosis not present

## 2016-02-15 DIAGNOSIS — D631 Anemia in chronic kidney disease: Secondary | ICD-10-CM | POA: Diagnosis not present

## 2016-02-16 DIAGNOSIS — N2581 Secondary hyperparathyroidism of renal origin: Secondary | ICD-10-CM | POA: Diagnosis not present

## 2016-02-16 DIAGNOSIS — D631 Anemia in chronic kidney disease: Secondary | ICD-10-CM | POA: Diagnosis not present

## 2016-02-16 DIAGNOSIS — N186 End stage renal disease: Secondary | ICD-10-CM | POA: Diagnosis not present

## 2016-02-17 DIAGNOSIS — D631 Anemia in chronic kidney disease: Secondary | ICD-10-CM | POA: Diagnosis not present

## 2016-02-17 DIAGNOSIS — N2581 Secondary hyperparathyroidism of renal origin: Secondary | ICD-10-CM | POA: Diagnosis not present

## 2016-02-17 DIAGNOSIS — N186 End stage renal disease: Secondary | ICD-10-CM | POA: Diagnosis not present

## 2016-02-18 DIAGNOSIS — N2581 Secondary hyperparathyroidism of renal origin: Secondary | ICD-10-CM | POA: Diagnosis not present

## 2016-02-18 DIAGNOSIS — N186 End stage renal disease: Secondary | ICD-10-CM | POA: Diagnosis not present

## 2016-02-18 DIAGNOSIS — D631 Anemia in chronic kidney disease: Secondary | ICD-10-CM | POA: Diagnosis not present

## 2016-02-19 DIAGNOSIS — N2581 Secondary hyperparathyroidism of renal origin: Secondary | ICD-10-CM | POA: Diagnosis not present

## 2016-02-19 DIAGNOSIS — N186 End stage renal disease: Secondary | ICD-10-CM | POA: Diagnosis not present

## 2016-02-19 DIAGNOSIS — D631 Anemia in chronic kidney disease: Secondary | ICD-10-CM | POA: Diagnosis not present

## 2016-02-20 DIAGNOSIS — N2581 Secondary hyperparathyroidism of renal origin: Secondary | ICD-10-CM | POA: Diagnosis not present

## 2016-02-20 DIAGNOSIS — Z992 Dependence on renal dialysis: Secondary | ICD-10-CM | POA: Diagnosis not present

## 2016-02-20 DIAGNOSIS — N186 End stage renal disease: Secondary | ICD-10-CM | POA: Diagnosis not present

## 2016-02-20 DIAGNOSIS — D631 Anemia in chronic kidney disease: Secondary | ICD-10-CM | POA: Diagnosis not present

## 2016-02-21 DIAGNOSIS — D631 Anemia in chronic kidney disease: Secondary | ICD-10-CM | POA: Diagnosis not present

## 2016-02-21 DIAGNOSIS — N2581 Secondary hyperparathyroidism of renal origin: Secondary | ICD-10-CM | POA: Diagnosis not present

## 2016-02-21 DIAGNOSIS — N186 End stage renal disease: Secondary | ICD-10-CM | POA: Diagnosis not present

## 2016-03-22 DIAGNOSIS — N186 End stage renal disease: Secondary | ICD-10-CM | POA: Diagnosis not present

## 2016-03-22 DIAGNOSIS — Z992 Dependence on renal dialysis: Secondary | ICD-10-CM | POA: Diagnosis not present

## 2016-03-23 DIAGNOSIS — D631 Anemia in chronic kidney disease: Secondary | ICD-10-CM | POA: Diagnosis not present

## 2016-03-23 DIAGNOSIS — N2581 Secondary hyperparathyroidism of renal origin: Secondary | ICD-10-CM | POA: Diagnosis not present

## 2016-03-23 DIAGNOSIS — N186 End stage renal disease: Secondary | ICD-10-CM | POA: Diagnosis not present

## 2016-04-04 DIAGNOSIS — E039 Hypothyroidism, unspecified: Secondary | ICD-10-CM | POA: Diagnosis not present

## 2016-04-04 DIAGNOSIS — I119 Hypertensive heart disease without heart failure: Secondary | ICD-10-CM | POA: Diagnosis not present

## 2016-04-04 DIAGNOSIS — R569 Unspecified convulsions: Secondary | ICD-10-CM | POA: Diagnosis not present

## 2016-04-04 DIAGNOSIS — N3281 Overactive bladder: Secondary | ICD-10-CM | POA: Diagnosis not present

## 2016-04-04 DIAGNOSIS — M109 Gout, unspecified: Secondary | ICD-10-CM | POA: Diagnosis not present

## 2016-04-04 DIAGNOSIS — E785 Hyperlipidemia, unspecified: Secondary | ICD-10-CM | POA: Diagnosis not present

## 2016-04-04 DIAGNOSIS — I10 Essential (primary) hypertension: Secondary | ICD-10-CM | POA: Diagnosis not present

## 2016-04-04 DIAGNOSIS — N186 End stage renal disease: Secondary | ICD-10-CM | POA: Diagnosis not present

## 2016-04-10 DIAGNOSIS — H00015 Hordeolum externum left lower eyelid: Secondary | ICD-10-CM | POA: Diagnosis not present

## 2016-04-10 DIAGNOSIS — R569 Unspecified convulsions: Secondary | ICD-10-CM | POA: Diagnosis not present

## 2016-04-10 DIAGNOSIS — E039 Hypothyroidism, unspecified: Secondary | ICD-10-CM | POA: Diagnosis not present

## 2016-04-10 DIAGNOSIS — N390 Urinary tract infection, site not specified: Secondary | ICD-10-CM | POA: Diagnosis not present

## 2016-04-10 DIAGNOSIS — I10 Essential (primary) hypertension: Secondary | ICD-10-CM | POA: Diagnosis not present

## 2016-04-10 DIAGNOSIS — E785 Hyperlipidemia, unspecified: Secondary | ICD-10-CM | POA: Diagnosis not present

## 2016-04-10 DIAGNOSIS — R319 Hematuria, unspecified: Secondary | ICD-10-CM | POA: Diagnosis not present

## 2016-04-10 DIAGNOSIS — N186 End stage renal disease: Secondary | ICD-10-CM | POA: Diagnosis not present

## 2016-04-21 ENCOUNTER — Emergency Department (HOSPITAL_COMMUNITY): Payer: Medicare Other

## 2016-04-21 ENCOUNTER — Observation Stay (HOSPITAL_COMMUNITY)
Admission: EM | Admit: 2016-04-21 | Discharge: 2016-04-22 | Disposition: A | Discharge status: Home / Self Care | Payer: Medicare Other | Attending: Internal Medicine | Admitting: Internal Medicine

## 2016-04-21 ENCOUNTER — Encounter (HOSPITAL_COMMUNITY): Payer: Self-pay | Admitting: Emergency Medicine

## 2016-04-21 DIAGNOSIS — F259 Schizoaffective disorder, unspecified: Secondary | ICD-10-CM | POA: Diagnosis not present

## 2016-04-21 DIAGNOSIS — N186 End stage renal disease: Secondary | ICD-10-CM | POA: Diagnosis not present

## 2016-04-21 DIAGNOSIS — D649 Anemia, unspecified: Secondary | ICD-10-CM | POA: Diagnosis not present

## 2016-04-21 DIAGNOSIS — E785 Hyperlipidemia, unspecified: Secondary | ICD-10-CM | POA: Insufficient documentation

## 2016-04-21 DIAGNOSIS — E039 Hypothyroidism, unspecified: Secondary | ICD-10-CM | POA: Insufficient documentation

## 2016-04-21 DIAGNOSIS — R569 Unspecified convulsions: Secondary | ICD-10-CM

## 2016-04-21 DIAGNOSIS — G40409 Other generalized epilepsy and epileptic syndromes, not intractable, without status epilepticus: Secondary | ICD-10-CM | POA: Diagnosis not present

## 2016-04-21 DIAGNOSIS — R4182 Altered mental status, unspecified: Secondary | ICD-10-CM

## 2016-04-21 DIAGNOSIS — Z992 Dependence on renal dialysis: Secondary | ICD-10-CM | POA: Diagnosis not present

## 2016-04-21 DIAGNOSIS — F319 Bipolar disorder, unspecified: Secondary | ICD-10-CM | POA: Diagnosis not present

## 2016-04-21 DIAGNOSIS — I120 Hypertensive chronic kidney disease with stage 5 chronic kidney disease or end stage renal disease: Secondary | ICD-10-CM | POA: Diagnosis not present

## 2016-04-21 DIAGNOSIS — G40909 Epilepsy, unspecified, not intractable, without status epilepticus: Secondary | ICD-10-CM | POA: Diagnosis present

## 2016-04-21 DIAGNOSIS — G9340 Encephalopathy, unspecified: Secondary | ICD-10-CM | POA: Diagnosis present

## 2016-04-21 DIAGNOSIS — I10 Essential (primary) hypertension: Secondary | ICD-10-CM | POA: Diagnosis present

## 2016-04-21 LAB — URINALYSIS, ROUTINE W REFLEX MICROSCOPIC
Bilirubin Urine: NEGATIVE
GLUCOSE, UA: NEGATIVE mg/dL
Hgb urine dipstick: NEGATIVE
KETONES UR: NEGATIVE mg/dL
LEUKOCYTES UA: NEGATIVE
NITRITE: NEGATIVE
PH: 7 (ref 5.0–8.0)
Protein, ur: 100 mg/dL — AB
SPECIFIC GRAVITY, URINE: 1.012 (ref 1.005–1.030)

## 2016-04-21 LAB — COMPREHENSIVE METABOLIC PANEL
ALBUMIN: 3.4 g/dL — AB (ref 3.5–5.0)
ALK PHOS: 132 U/L — AB (ref 38–126)
ALT: 8 U/L — ABNORMAL LOW (ref 14–54)
ANION GAP: 19 — AB (ref 5–15)
AST: 14 U/L — ABNORMAL LOW (ref 15–41)
BUN: 85 mg/dL — ABNORMAL HIGH (ref 6–20)
CHLORIDE: 84 mmol/L — AB (ref 101–111)
CO2: 27 mmol/L (ref 22–32)
Calcium: 10.2 mg/dL (ref 8.9–10.3)
Creatinine, Ser: 11.26 mg/dL — ABNORMAL HIGH (ref 0.44–1.00)
GFR calc non Af Amer: 3 mL/min — ABNORMAL LOW (ref >60)
GFR, EST AFRICAN AMERICAN: 4 mL/min — AB (ref >60)
GLUCOSE: 104 mg/dL — AB (ref 65–99)
POTASSIUM: 4.2 mmol/L (ref 3.5–5.1)
SODIUM: 130 mmol/L — AB (ref 135–145)
Total Bilirubin: 0.5 mg/dL (ref 0.3–1.2)
Total Protein: 7.7 g/dL (ref 6.5–8.1)

## 2016-04-21 LAB — CBC WITH DIFFERENTIAL/PLATELET
BASOS PCT: 0 %
Basophils Absolute: 0 10*3/uL (ref 0.0–0.1)
EOS ABS: 0 10*3/uL (ref 0.0–0.7)
EOS PCT: 0 %
HCT: 35.1 % — ABNORMAL LOW (ref 36.0–46.0)
HEMOGLOBIN: 11.4 g/dL — AB (ref 12.0–15.0)
LYMPHS ABS: 2.1 10*3/uL (ref 0.7–4.0)
Lymphocytes Relative: 20 %
MCH: 31.7 pg (ref 26.0–34.0)
MCHC: 32.5 g/dL (ref 30.0–36.0)
MCV: 97.5 fL (ref 78.0–100.0)
MONOS PCT: 6 %
Monocytes Absolute: 0.7 10*3/uL (ref 0.1–1.0)
NEUTROS PCT: 74 %
Neutro Abs: 7.7 10*3/uL (ref 1.7–7.7)
PLATELETS: 375 10*3/uL (ref 150–400)
RBC: 3.6 MIL/uL — ABNORMAL LOW (ref 3.87–5.11)
RDW: 15.6 % — AB (ref 11.5–15.5)
WBC: 10.6 10*3/uL — ABNORMAL HIGH (ref 4.0–10.5)

## 2016-04-21 LAB — VALPROIC ACID LEVEL: Valproic Acid Lvl: 54 ug/mL (ref 50.0–100.0)

## 2016-04-21 LAB — LACTIC ACID, PLASMA: LACTIC ACID, VENOUS: 2.3 mmol/L — AB (ref 0.5–1.9)

## 2016-04-21 LAB — URINE MICROSCOPIC-ADD ON

## 2016-04-21 LAB — AMMONIA: Ammonia: 42 umol/L — ABNORMAL HIGH (ref 9–35)

## 2016-04-21 MED ORDER — SODIUM CHLORIDE 0.9 % IV BOLUS (SEPSIS)
500.0000 mL | Freq: Once | INTRAVENOUS | Status: AC
  Administered 2016-04-21: 500 mL via INTRAVENOUS

## 2016-04-21 MED ORDER — SODIUM CHLORIDE 0.9 % IV BOLUS (SEPSIS)
30.0000 mL/kg | Freq: Once | INTRAVENOUS | Status: AC
  Administered 2016-04-21: 2130 mL via INTRAVENOUS

## 2016-04-21 NOTE — ED Notes (Signed)
Phlebotomy at bedside at this time.

## 2016-04-21 NOTE — ED Provider Notes (Signed)
CSN: [id number]     Arrival date & time 04/21/16  2009 History   First MD Initiated Contact with Patient 04/21/16 2014     Chief Complaint  Patient presents with  . Seizures     (Consider location/radiation/quality/duration/timing/severity/associated sxs/prior Treatment) HPI Comments: Patient with a history of seizures, schizophrenia, HLD, HTN, ESRD on peritoneal dialysis, lives at home with sister who provides her care, with complaint of seizures x 2 tonight. This first seizure occurred around 5:00 pm and was described as grand mal. She returned to her baseline between seizures and the second one occurred at 7:00 pm, described as staring and "lip twitching". Per family the patient has not been as active or verbal today. Normally she is ambulatory, conversive, active. No fever. The patient has not complained of any pain. There has been no congestion, cough, vomiting or diarrhea. Her sister reports she was treated for a UTI this week. She took the abx but states there were only a very limited number of treatment days. Her last seizure was November of 2016 and she is normally well controlled on Depakote and Keppra. Her sister provides her medications to her and reports she has been compliant. No recent changes to medications or dosing.    Patient is a 50 y.o. female presenting with seizures.  Seizures   Past Medical History  Diagnosis Date  . Renal disease   . Hypertension   . Anemia   . Hyperlipemia   . Moderately mentally retarded   . Bipolar disorder ([place])   . Depression   . Hypothyroidism   . Seizures ([place])     No recent seizures - no meds  . Schizophrenia ([place])   . ESRD on peritoneal dialysis [facility])    Past Surgical History  Procedure Laterality Date  . Breast reduction surgery    . Foot surgery      right  . Examination under anesthesia N/A 12/08/2013    Procedure: EXAM UNDER ANESTHESIA;  Surgeon: Lavonia Drafts, MD;  Location: [facility];  Service: Gynecology;   Laterality: N/A;  Pelvic exam and pap smear, unable to tolerate during last office visit  . Breast surgery      breast reduction   No family history on file. Social History  Substance Use Topics  . Smoking status: Never Smoker   . Smokeless tobacco: Never Used  . Alcohol Use: No   OB History    Gravida Para Term Preterm AB TAB SAB Ectopic Multiple Living   0 0 0 0 0 0 0 0 0 0      Review of Systems  Unable to perform ROS: Mental status change      Allergies  Review of patient's allergies indicates no known allergies.  Home Medications   Prior to Admission medications   Medication Sig Start Date End Date Taking? Authorizing Provider  allopurinol (ZYLOPRIM) 300 MG tablet Take 300 mg by mouth daily.    Historical Provider, MD  cinacalcet (SENSIPAR) 30 MG tablet Take 30 mg by mouth daily.    Historical Provider, MD  cyanocobalamin 500 MCG tablet Take 500 mcg by mouth daily.    Historical Provider, MD  divalproex (DEPAKOTE) 500 MG DR tablet Take 1 tablet (500 mg total) by mouth 2 (two) times daily. 09/17/15   Charlynne Cousins, MD  ferrous sulfate 325 (65 FE) MG EC tablet Take 325 mg by mouth 2 (two) times daily.     Historical Provider, MD  levETIRAcetam (KEPPRA) 500 MG tablet Take 1 tablet (  500 mg total) by mouth 2 (two) times daily. 09/17/15   Charlynne Cousins, MD  levofloxacin (LEVAQUIN) 500 MG tablet Take 1 tablet (500 mg total) by mouth every other day. 09/17/15   Charlynne Cousins, MD  medroxyPROGESTERone (DEPO-PROVERA) 150 MG/ML injection Inject 150 mg into the muscle every 3 (three) months.     Historical Provider, MD  potassium chloride (KLOR-CON) 20 MEQ packet Take 20 mEq by mouth daily.     Historical Provider, MD  risperiDONE (RISPERDAL) 0.5 MG tablet Take 0.5 mg by mouth 2 (two) times daily as needed. As needed for agitation    Historical Provider, MD  risperiDONE (RISPERDAL) 3 MG tablet Take 3 mg by mouth at bedtime.    Historical Provider, MD  sevelamer  carbonate (RENVELA) 800 MG tablet Take 800 mg by mouth 3 (three) times daily with meals.    Historical Provider, MD  vitamin B-12 (CYANOCOBALAMIN) 500 MCG tablet Take 500 mcg by mouth daily.    Historical Provider, MD   There were no vitals taken for this visit. Physical Exam  Constitutional: She appears well-developed and well-nourished. No distress.  Awake, tracking movement.  HENT:  Head: Atraumatic.  Eyes: Conjunctivae are normal. No scleral icterus.  Neck: Normal range of motion.  Cardiovascular: Tachycardia present.   No murmur heard. Pulmonary/Chest: Effort normal. She has no wheezes. She has no rales.  Abdominal: Soft. She exhibits no mass. There is no tenderness.  Musculoskeletal: She exhibits no edema.  Neurological: She is alert.  The patient does not follow command. She tracks movement with eyes but does not track to command. She attempts to verbalize "hello" but physical attempt poor. Reflexes are equal. She is drooling. No visualized facial asymmetry.   Skin: Skin is warm and dry.    ED Course  Procedures (including critical care time) Labs Review Labs Reviewed - No data to display Results for orders placed or performed during the hospital encounter of 04/21/16  CBC with Differential  Result Value Ref Range   WBC 10.6 (H) 4.0 - 10.5 K/uL   RBC 3.60 (L) 3.87 - 5.11 MIL/uL   Hemoglobin 11.4 (L) 12.0 - 15.0 g/dL   HCT 35.1 (L) 36.0 - 46.0 %   MCV 97.5 78.0 - 100.0 fL   MCH 31.7 26.0 - 34.0 pg   MCHC 32.5 30.0 - 36.0 g/dL   RDW 15.6 (H) 11.5 - 15.5 %   Platelets 375 150 - 400 K/uL   Neutrophils Relative % 74 %   Neutro Abs 7.7 1.7 - 7.7 K/uL   Lymphocytes Relative 20 %   Lymphs Abs 2.1 0.7 - 4.0 K/uL   Monocytes Relative 6 %   Monocytes Absolute 0.7 0.1 - 1.0 K/uL   Eosinophils Relative 0 %   Eosinophils Absolute 0.0 0.0 - 0.7 K/uL   Basophils Relative 0 %   Basophils Absolute 0.0 0.0 - 0.1 K/uL  Comprehensive metabolic panel  Result Value Ref Range   Sodium  130 (L) 135 - 145 mmol/L   Potassium 4.2 3.5 - 5.1 mmol/L   Chloride 84 (L) 101 - 111 mmol/L   CO2 27 22 - 32 mmol/L   Glucose, Bld 104 (H) 65 - 99 mg/dL   BUN 85 (H) 6 - 20 mg/dL   Creatinine, Ser 11.26 (H) 0.44 - 1.00 mg/dL   Calcium 10.2 8.9 - 10.3 mg/dL   Total Protein 7.7 6.5 - 8.1 g/dL   Albumin 3.4 (L) 3.5 - 5.0 g/dL   AST  14 (L) 15 - 41 U/L   ALT 8 (L) 14 - 54 U/L   Alkaline Phosphatase 132 (H) 38 - 126 U/L   Total Bilirubin 0.5 0.3 - 1.2 mg/dL   GFR calc non Af Amer 3 (L) >60 mL/min   GFR calc Af Amer 4 (L) >60 mL/min   Anion gap 19 (H) 5 - 15  Urinalysis, Routine w reflex microscopic  Result Value Ref Range   Color, Urine YELLOW YELLOW   APPearance CLEAR CLEAR   Specific Gravity, Urine 1.012 1.005 - 1.030   pH 7.0 5.0 - 8.0   Glucose, UA NEGATIVE NEGATIVE mg/dL   Hgb urine dipstick NEGATIVE NEGATIVE   Bilirubin Urine NEGATIVE NEGATIVE   Ketones, ur NEGATIVE NEGATIVE mg/dL   Protein, ur 100 (A) NEGATIVE mg/dL   Nitrite NEGATIVE NEGATIVE   Leukocytes, UA NEGATIVE NEGATIVE  Valproic acid level  Result Value Ref Range   Valproic Acid Lvl 54 50.0 - 100.0 ug/mL  Lactic acid, plasma  Result Value Ref Range   Lactic Acid, Venous 2.3 (HH) 0.5 - 1.9 mmol/L  Ammonia  Result Value Ref Range   Ammonia 42 (H) 9 - 35 umol/L  Urine microscopic-add on  Result Value Ref Range   Squamous Epithelial / LPF 0-5 (A) NONE SEEN   WBC, UA 0-5 0 - 5 WBC/hpf   RBC / HPF 0-5 0 - 5 RBC/hpf   Bacteria, UA RARE (A) NONE SEEN   Dg Chest 1 View  04/21/2016  CLINICAL DATA:  Altered mental status EXAM: CHEST 1 VIEW COMPARISON:  09/14/2015 FINDINGS: Hypoventilation and decreased lung volume. Mild bibasilar atelectasis. Negative for heart failure. Negative for pneumonia or effusion IMPRESSION: Hypoventilation with mild bibasilar atelectasis. Electronically Signed   By: Franchot Gallo M.D.   On: 04/21/2016 22:53   Ct Head Wo Contrast  04/21/2016  CLINICAL DATA:  50 year old female with altered  mental status EXAM: CT HEAD WITHOUT CONTRAST TECHNIQUE: Contiguous axial images were obtained from the base of the skull through the vertex without intravenous contrast. COMPARISON:  Head CT dated 09/14/2015 and MRI dated 09/15/2015 FINDINGS: The ventricles and the sulci are appropriate in size for the patient's age. There is no intracranial hemorrhage. No midline shift or mass effect identified. The gray-white matter differentiation is preserved. The visualized paranasal sinuses and mastoid air cells are well aerated. The calvarium is intact. IMPRESSION: No acute intracranial pathology. Electronically Signed   By: Anner Crete M.D.   On: 04/21/2016 23:43    Imaging Review No results found. I have personally reviewed and evaluated these images and lab results as part of my medical decision-making.   EKG Interpretation None      MDM   Final diagnoses:  None    1. Seizure activity 2. Seizure disorder  The patient's chart is reviewed. She had an admission last year - November 2016 - for seizures with evidence of a gastroenteritis just prior to seizure activity, thought contributory. She had a UTI this week which may also contributing to current condition.   UA, labs pending. IVF's started. Will monitor over time for improvement in mental alertness.   10:00 - no change in mentation  12:00 - CT head and CXR negative for acute finding. She is minimally more responsive but far from baseline mentation. Plan to admit for further management.   Discussed with Dr. Hal Hope who accepts for admission. Discussed the patient with Dr. Nicole Kindred (neurology) who will provide a neurologic consult.  Charlann Lange, PA-C 04/22/16  [id number]  Julianne Rice, MD 04/24/16 [phone]

## 2016-04-21 NOTE — ED Notes (Signed)
Patient arrived to ED via [facility] from home. Lives with sister and brother-in-law. Patient has hx of seizures, MR, Renal failure, Bipolar, Schizophrenia. Patient receives home dialysis 3 times daily. 3rd session usually at 2200. Patient has been nonverbal with EMS. Staring. Family reports seizure at 1700 lasting 2 minute, and seizure at 1900 lasting 1 minute. No neuro deficits noted. Patient was able to ambulate with assistance to ambulance. 20 gauge in L hand. VSS. BP 126/90, Pulse 106, 98% on room air. CBG 06.

## 2016-04-22 ENCOUNTER — Encounter (HOSPITAL_COMMUNITY): Payer: Self-pay | Admitting: Internal Medicine

## 2016-04-22 DIAGNOSIS — G40909 Epilepsy, unspecified, not intractable, without status epilepticus: Secondary | ICD-10-CM | POA: Diagnosis not present

## 2016-04-22 DIAGNOSIS — N186 End stage renal disease: Secondary | ICD-10-CM | POA: Diagnosis not present

## 2016-04-22 DIAGNOSIS — G9340 Encephalopathy, unspecified: Secondary | ICD-10-CM | POA: Diagnosis not present

## 2016-04-22 DIAGNOSIS — R569 Unspecified convulsions: Secondary | ICD-10-CM

## 2016-04-22 DIAGNOSIS — G40409 Other generalized epilepsy and epileptic syndromes, not intractable, without status epilepticus: Secondary | ICD-10-CM | POA: Diagnosis not present

## 2016-04-22 DIAGNOSIS — D631 Anemia in chronic kidney disease: Secondary | ICD-10-CM | POA: Diagnosis not present

## 2016-04-22 DIAGNOSIS — R4182 Altered mental status, unspecified: Secondary | ICD-10-CM | POA: Diagnosis not present

## 2016-04-22 DIAGNOSIS — N2581 Secondary hyperparathyroidism of renal origin: Secondary | ICD-10-CM | POA: Diagnosis not present

## 2016-04-22 LAB — LACTIC ACID, PLASMA: Lactic Acid, Venous: 1.7 mmol/L (ref 0.5–1.9)

## 2016-04-22 LAB — MRSA PCR SCREENING: MRSA BY PCR: NEGATIVE

## 2016-04-22 MED ORDER — HYDRALAZINE HCL 20 MG/ML IJ SOLN: 5.0000 mg | INTRAMUSCULAR | Status: DC | PRN

## 2016-04-22 MED ORDER — FERROUS SULFATE 325 (65 FE) MG PO TABS
325.0000 mg | ORAL_TABLET | Freq: Two times a day (BID) | ORAL | Status: DC
  Administered 2016-04-22: 325 mg via ORAL
  Filled 2016-04-22: qty 1

## 2016-04-22 MED ORDER — SEVELAMER CARBONATE 800 MG PO TABS
800.0000 mg | ORAL_TABLET | Freq: Three times a day (TID) | ORAL | Status: DC
  Administered 2016-04-22 (×2): 800 mg via ORAL
  Filled 2016-04-22 (×2): qty 1

## 2016-04-22 MED ORDER — ACETAMINOPHEN 325 MG PO TABS: 650.0000 mg | ORAL_TABLET | Freq: Four times a day (QID) | ORAL | Status: DC | PRN

## 2016-04-22 MED ORDER — ONDANSETRON HCL 4 MG PO TABS: 4.0000 mg | ORAL_TABLET | Freq: Four times a day (QID) | ORAL | Status: DC | PRN

## 2016-04-22 MED ORDER — POLYETHYLENE GLYCOL 3350 17 G PO PACK
17.0000 g | PACK | Freq: Every day | ORAL | Status: DC
  Administered 2016-04-22: 17 g via ORAL
  Filled 2016-04-22: qty 1

## 2016-04-22 MED ORDER — ALLOPURINOL 300 MG PO TABS
300.0000 mg | ORAL_TABLET | Freq: Every day | ORAL | Status: DC
  Administered 2016-04-22: 300 mg via ORAL
  Filled 2016-04-22: qty 1

## 2016-04-22 MED ORDER — ACETAMINOPHEN 650 MG RE SUPP: 650.0000 mg | Freq: Four times a day (QID) | RECTAL | Status: DC | PRN

## 2016-04-22 MED ORDER — VITAMIN B-12 500 MCG PO TABS: 500.0000 ug | ORAL_TABLET | Freq: Every day | ORAL | Status: DC

## 2016-04-22 MED ORDER — DIVALPROEX SODIUM 500 MG PO DR TAB
750.0000 mg | DELAYED_RELEASE_TABLET | Freq: Two times a day (BID) | ORAL | Status: DC
  Filled 2016-04-22: qty 1

## 2016-04-22 MED ORDER — ONDANSETRON HCL 4 MG/2ML IJ SOLN: 4.0000 mg | Freq: Four times a day (QID) | INTRAMUSCULAR | Status: DC | PRN

## 2016-04-22 MED ORDER — SODIUM CHLORIDE 0.9% FLUSH
3.0000 mL | Freq: Two times a day (BID) | INTRAVENOUS | Status: DC
  Administered 2016-04-22: 3 mL via INTRAVENOUS

## 2016-04-22 MED ORDER — BISACODYL 10 MG RE SUPP
10.0000 mg | Freq: Once | RECTAL | Status: AC
  Administered 2016-04-22: 10 mg via RECTAL
  Filled 2016-04-22: qty 1

## 2016-04-22 MED ORDER — CINACALCET HCL 30 MG PO TABS
30.0000 mg | ORAL_TABLET | Freq: Three times a day (TID) | ORAL | Status: DC
  Administered 2016-04-22 (×2): 30 mg via ORAL
  Filled 2016-04-22 (×2): qty 1

## 2016-04-22 MED ORDER — RISPERIDONE 0.5 MG PO TABS: 3.0000 mg | ORAL_TABLET | Freq: Every day | ORAL | Status: DC

## 2016-04-22 MED ORDER — VALPROATE SODIUM 500 MG/5ML IV SOLN
750.0000 mg | Freq: Two times a day (BID) | INTRAVENOUS | Status: DC
  Administered 2016-04-22: 750 mg via INTRAVENOUS
  Filled 2016-04-22: qty 7.5

## 2016-04-22 MED ORDER — SODIUM CHLORIDE 0.9 % IV SOLN
INTRAVENOUS | Status: AC
  Administered 2016-04-22: 02:00:00 via INTRAVENOUS

## 2016-04-22 MED ORDER — DIVALPROEX SODIUM 250 MG PO DR TAB: 750.0000 mg | DELAYED_RELEASE_TABLET | Freq: Two times a day (BID) | ORAL | Status: DC

## 2016-04-22 MED ORDER — LEVETIRACETAM 500 MG PO TABS
500.0000 mg | ORAL_TABLET | Freq: Two times a day (BID) | ORAL | Status: DC
  Administered 2016-04-22: 500 mg via ORAL
  Filled 2016-04-22: qty 1

## 2016-04-22 MED ORDER — SODIUM CHLORIDE 0.9 % IV SOLN
500.0000 mg | Freq: Two times a day (BID) | INTRAVENOUS | Status: DC
  Administered 2016-04-22: 500 mg via INTRAVENOUS
  Filled 2016-04-22: qty 5

## 2016-04-22 MED ORDER — RISPERIDONE 0.5 MG PO TABS: 0.5000 mg | ORAL_TABLET | Freq: Two times a day (BID) | ORAL | Status: DC | PRN

## 2016-04-22 MED ORDER — HEPARIN SODIUM (PORCINE) 5000 UNIT/ML IJ SOLN
5000.0000 [IU] | Freq: Three times a day (TID) | INTRAMUSCULAR | Status: DC
  Administered 2016-04-22: 5000 [IU] via SUBCUTANEOUS
  Filled 2016-04-22: qty 1

## 2016-04-22 MED ORDER — POTASSIUM CHLORIDE 20 MEQ PO PACK
20.0000 meq | PACK | Freq: Every day | ORAL | Status: DC
  Administered 2016-04-22: 20 meq via ORAL
  Filled 2016-04-22: qty 1

## 2016-04-22 MED ORDER — VITAMIN B-12 1000 MCG PO TABS
500.0000 ug | ORAL_TABLET | Freq: Every day | ORAL | Status: DC
  Administered 2016-04-22: 500 ug via ORAL
  Filled 2016-04-22: qty 1

## 2016-04-22 NOTE — Consult Note (Signed)
Admission H&P    Chief Complaint: Recurrent seizures.  HPI: Jane [name] is an 50 y.o. female with a history of hypertension, hyperlipidemia, bipolar disorder, mild mental retardation, hypothyroidism and end-stage renal disease on dialysis, brought to the emergency room following a witnessed seizure activity. Patient had a generalized seizure as well as a subsequent partial seizure, both witnessed by her sister who is also her caretaker. Her last seizure was in November 2016. She's currently taking Depakote 500 mg twice a day and Keppra 500 mg twice a day. Depakote level was 54. Keppra level is pending. Patient was noted to be confused and somewhat agitated in the emergency room, thought to be at least in part postictal. CT scan of her head showed no acute intracranial abnormality. Patient was afebrile. Laboratory studies were unremarkable except for mild hyponatremia media (130).  Past Medical History  Diagnosis Date  . Renal disease   . Hypertension   . Anemia   . Hyperlipemia   . Moderately mentally retarded   . Bipolar disorder ([place])   . Depression   . Hypothyroidism   . Seizures ([place])     No recent seizures - no meds  . Schizophrenia ([place])   . ESRD on peritoneal dialysis [facility])     Past Surgical History  Procedure Laterality Date  . Breast reduction surgery    . Foot surgery      right  . Examination under anesthesia N/A 12/08/2013    Procedure: EXAM UNDER ANESTHESIA;  Surgeon: Lavonia Drafts, MD;  Location: [facility];  Service: Gynecology;  Laterality: N/A;  Pelvic exam and pap smear, unable to tolerate during last office visit  . Breast surgery      breast reduction    No family history on file. Social History:  reports that she has never smoked. She has never used smokeless tobacco. She reports that she does not drink alcohol or use illicit drugs.  Allergies: No Known Allergies  Medications Prior to Admission  Medication Sig Dispense Refill  . allopurinol  (ZYLOPRIM) 300 MG tablet Take 300 mg by mouth daily. Reported on 04/21/2016    . cinacalcet (SENSIPAR) 30 MG tablet Take 30 mg by mouth 3 (three) times daily.     . cyanocobalamin 500 MCG tablet Take 500 mcg by mouth daily.    . divalproex (DEPAKOTE) 500 MG DR tablet Take 1 tablet (500 mg total) by mouth 2 (two) times daily. 60 tablet 0  . ferrous sulfate 325 (65 FE) MG EC tablet Take 325 mg by mouth 2 (two) times daily.     Marland Kitchen levETIRAcetam (KEPPRA) 500 MG tablet Take 1 tablet (500 mg total) by mouth 2 (two) times daily. 60 tablet 3  . levofloxacin (LEVAQUIN) 500 MG tablet Take 1 tablet (500 mg total) by mouth every other day. (Patient not taking: Reported on 04/21/2016) 5 tablet 0  . medroxyPROGESTERone (DEPO-PROVERA) 150 MG/ML injection Inject 150 mg into the muscle every 3 (three) months.     . potassium chloride (KLOR-CON) 20 MEQ packet Take 20 mEq by mouth daily.     . risperiDONE (RISPERDAL) 0.5 MG tablet Take 0.5 mg by mouth 2 (two) times daily as needed. As needed for agitation    . risperiDONE (RISPERDAL) 3 MG tablet Take 3 mg by mouth at bedtime.    . sevelamer carbonate (RENVELA) 800 MG tablet Take 800 mg by mouth 3 (three) times daily with meals.    . vitamin B-12 (CYANOCOBALAMIN) 500 MCG tablet Take 500 mcg  by mouth daily.      ROS: History obtained from sibling  General ROS: negative for - chills, fatigue, fever, night sweats, weight gain or weight loss Psychological ROS: negative for - behavioral disorder, hallucinations, memory difficulties, mood swings or suicidal ideation Ophthalmic ROS: negative for - blurry vision, double vision, eye pain or loss of vision ENT ROS: negative for - epistaxis, nasal discharge, oral lesions, sore throat, tinnitus or vertigo Allergy and Immunology ROS: negative for - hives or itchy/watery eyes Hematological and Lymphatic ROS: negative for - bleeding problems, bruising or swollen lymph nodes Endocrine ROS: negative for - galactorrhea, hair pattern  changes, polydipsia/polyuria or temperature intolerance Respiratory ROS: negative for - cough, hemoptysis, shortness of breath or wheezing Cardiovascular ROS: negative for - chest pain, dyspnea on exertion, edema or irregular heartbeat Gastrointestinal ROS: negative for - abdominal pain, diarrhea, hematemesis, nausea/vomiting or stool incontinence Genito-Urinary ROS: Treated for UTI earlier in the week Musculoskeletal ROS: negative for - joint swelling or muscular weakness Neurological ROS: as noted in HPI Dermatological ROS: negative for rash and skin lesion changes  Physical Examination: Blood pressure 161/95, pulse 94, temperature 97.8 F (36.6 C), temperature source Oral, resp. rate 25, height 5' 4" (1.626 m), weight 76.8 kg (169 lb 5 oz), SpO2 91 %.  HEENT-  Normocephalic, no lesions, without obvious abnormality.  Normal external eye and conjunctiva.  Normal TM's bilaterally.  Normal auditory canals and external ears. Normal external nose, mucus membranes and septum.  Normal pharynx. Neck supple with no masses, nodes, nodules or enlargement. Cardiovascular - regular rate and rhythm, S1, S2 normal, no murmur, click, rub or gallop Lungs - chest clear, no wheezing, rales, normal symmetric air entry Abdomen - soft, non-tender; bowel sounds normal; no masses,  no organomegaly Extremities - no joint deformities, effusion, or inflammation  Neurologic Examination: Mental Status: Alert, disoriented to place and time, no acute distress.  Speech moderately slurred without evidence of aphasia. Able to follow commands without difficulty. Cranial Nerves: II-Visual fields were normal. III/IV/VI-Pupils were equal and reacted normally to light. Extraocular movements were full and conjugate.    V/VII-no facial numbness and no facial weakness. VIII-normal. X-moderate dysarthria. XI: trapezius strength/neck flexion strength normal bilaterally XII-midline tongue extension with normal strength. Motor:  No drift of any extremities; normal muscle tone Sensory: Normal throughout. Deep Tendon Reflexes: 1+ and symmetric. Plantars: Flexor bilaterally  Results for orders placed or performed during the hospital encounter of 04/21/16 (from the past 48 hour(s))  Urinalysis, Routine w reflex microscopic     Status: Abnormal   Collection Time: 04/21/16  9:42 PM  Result Value Ref Range   Color, Urine YELLOW YELLOW   APPearance CLEAR CLEAR   Specific Gravity, Urine 1.012 1.005 - 1.030   pH 7.0 5.0 - 8.0   Glucose, UA NEGATIVE NEGATIVE mg/dL   Hgb urine dipstick NEGATIVE NEGATIVE   Bilirubin Urine NEGATIVE NEGATIVE   Ketones, ur NEGATIVE NEGATIVE mg/dL   Protein, ur 100 (A) NEGATIVE mg/dL   Nitrite NEGATIVE NEGATIVE   Leukocytes, UA NEGATIVE NEGATIVE  Urine microscopic-add on     Status: Abnormal   Collection Time: 04/21/16  9:42 PM  Result Value Ref Range   Squamous Epithelial / LPF 0-5 (A) NONE SEEN   WBC, UA 0-5 0 - 5 WBC/hpf   RBC / HPF 0-5 0 - 5 RBC/hpf   Bacteria, UA RARE (A) NONE SEEN  CBC with Differential     Status: Abnormal   Collection Time: 04/21/16  9:47  PM  Result Value Ref Range   WBC 10.6 (H) 4.0 - 10.5 K/uL   RBC 3.60 (L) 3.87 - 5.11 MIL/uL   Hemoglobin 11.4 (L) 12.0 - 15.0 g/dL   HCT 35.1 (L) 36.0 - 46.0 %   MCV 97.5 78.0 - 100.0 fL   MCH 31.7 26.0 - 34.0 pg   MCHC 32.5 30.0 - 36.0 g/dL   RDW 15.6 (H) 11.5 - 15.5 %   Platelets 375 150 - 400 K/uL   Neutrophils Relative % 74 %   Neutro Abs 7.7 1.7 - 7.7 K/uL   Lymphocytes Relative 20 %   Lymphs Abs 2.1 0.7 - 4.0 K/uL   Monocytes Relative 6 %   Monocytes Absolute 0.7 0.1 - 1.0 K/uL   Eosinophils Relative 0 %   Eosinophils Absolute 0.0 0.0 - 0.7 K/uL   Basophils Relative 0 %   Basophils Absolute 0.0 0.0 - 0.1 K/uL  Comprehensive metabolic panel     Status: Abnormal   Collection Time: 04/21/16  9:47 PM  Result Value Ref Range   Sodium 130 (L) 135 - 145 mmol/L   Potassium 4.2 3.5 - 5.1 mmol/L   Chloride 84 (L)  101 - 111 mmol/L   CO2 27 22 - 32 mmol/L   Glucose, Bld 104 (H) 65 - 99 mg/dL   BUN 85 (H) 6 - 20 mg/dL   Creatinine, Ser 11.26 (H) 0.44 - 1.00 mg/dL   Calcium 10.2 8.9 - 10.3 mg/dL   Total Protein 7.7 6.5 - 8.1 g/dL   Albumin 3.4 (L) 3.5 - 5.0 g/dL   AST 14 (L) 15 - 41 U/L   ALT 8 (L) 14 - 54 U/L   Alkaline Phosphatase 132 (H) 38 - 126 U/L   Total Bilirubin 0.5 0.3 - 1.2 mg/dL   GFR calc non Af Amer 3 (L) >60 mL/min   GFR calc Af Amer 4 (L) >60 mL/min    Comment: (NOTE) The eGFR has been calculated using the CKD EPI equation. This calculation has not been validated in all clinical situations. eGFR's persistently <60 mL/min signify possible Chronic Kidney Disease.    Anion gap 19 (H) 5 - 15  Valproic acid level     Status: None   Collection Time: 04/21/16  9:47 PM  Result Value Ref Range   Valproic Acid Lvl 54 50.0 - 100.0 ug/mL  Lactic acid, plasma     Status: Abnormal   Collection Time: 04/21/16  9:49 PM  Result Value Ref Range   Lactic Acid, Venous 2.3 (HH) 0.5 - 1.9 mmol/L    Comment: CRITICAL RESULT CALLED TO, READ BACK BY AND VERIFIED WITH: MUNETT,K RN [id number] 6.30.17 [id number],G   Ammonia     Status: Abnormal   Collection Time: 04/21/16  9:50 PM  Result Value Ref Range   Ammonia 42 (H) 9 - 35 umol/L  Lactic acid, plasma     Status: None   Collection Time: 04/22/16 12:05 AM  Result Value Ref Range   Lactic Acid, Venous 1.7 0.5 - 1.9 mmol/L   Dg Chest 1 View  04/21/2016  CLINICAL DATA:  Altered mental status EXAM: CHEST 1 VIEW COMPARISON:  09/14/2015 FINDINGS: Hypoventilation and decreased lung volume. Mild bibasilar atelectasis. Negative for heart failure. Negative for pneumonia or effusion IMPRESSION: Hypoventilation with mild bibasilar atelectasis. Electronically Signed   By: Franchot Gallo M.D.   On: 04/21/2016 22:53   Ct Head Wo Contrast  04/21/2016  CLINICAL DATA:  50 year old female with altered mental  status EXAM: CT HEAD WITHOUT CONTRAST TECHNIQUE: Contiguous  axial images were obtained from the base of the skull through the vertex without intravenous contrast. COMPARISON:  Head CT dated 09/14/2015 and MRI dated 09/15/2015 FINDINGS: The ventricles and the sulci are appropriate in size for the patient's age. There is no intracranial hemorrhage. No midline shift or mass effect identified. The gray-white matter differentiation is preserved. The visualized paranasal sinuses and mastoid air cells are well aerated. The calvarium is intact. IMPRESSION: No acute intracranial pathology. Electronically Signed   By: Anner Crete M.D.   On: 04/21/2016 23:43    Assessment/Plan 50 year old lady with multiple medical problems including seizure disorder, presenting with recurrent seizure activity with generalized seizure activity as well as a partial seizure. Altered mental status is likely secondary to multiple factors, including postictal state, metabolic abnormalities and mild mental retardation.  Recommendations: 1. Continue Keppra 500 mg twice a day 2. Increase Depakote to 750 mg twice a day 3. No further neurodiagnostic studies are indicated at this point as patient's mental status is improving, and an patient has no new neurologic deficit.  We will continue to follow this patient with you.  C.R. Nicole Kindred, MD [facility] [phone]  04/22/2016, 2:52 AM

## 2016-04-22 NOTE — Discharge Summary (Signed)
Physician Discharge Summary  Jane [name] [id number] DOB: [birth year] DOA: 04/21/2016  PCP: [name], MD  Admit date: 04/21/2016 Discharge date: 04/22/2016  Admitted From: home Disposition:  home  Recommendations for Outpatient Follow-up:  1. Follow up with PCP in 1-2 weeks 2. Follow up with Neurology in 1 week 3. Depakote increased to 750 mg BID per neurology   Home Health: no Equipment/Devices: no  Discharge Condition: stable CODE STATUS: Full Diet recommendation: renal  Brief/Interim Summary/HPI: 50 y.o. female with with known history of seizure on Depakote and Keppra, ESRD on peritoneal dialysis who was recently placed on antibiotics for UTI after patient had abdominal discomfort and nausea vomiting last week which has subsided and resolved was brought to the ER the patient had 2 episodes of seizures. As per patient's sister last evening around 4 PM patient had first episode and 2 hours later patient had another episode. First episode was most likely generalized lasted for less than 30 seconds and second episode was mostly twitching of her lower jaw. In the ER patient looks confused encephalopathic and does not follow commands. Depakote levels are therapeutic. CT head is unremarkable. On-call neurologist has been consulted and patient has been admitted since patient is still postictal.   Discharge Diagnoses:  Principal Problem:   Seizure disorder [facility]) Active Problems:   Schizoaffective disorder ([place])   Essential hypertension   Seizure ([place])   End-stage renal disease on peritoneal dialysis ([place])   Acute encephalopathy  Recurrent seizures with known history of seizures - neurology was consulted and have evaluated patient, admitted for observation to the hospital as she was still post ictal. Neurologist has recommended to increase Depakote dose from 500 mg twice a day to 750 twice a day. Patient returned to baseline per family members and asking to go home,  tolerating po medications and regular diet. I discussed with Dr. Nicole Kindred from neurology, and since patient is back to baseline she is clear for discharge Hypertension - as per patient's sister patient has not been taking antihypertensives recently because patient's blood pressure improved after dialysis. ESRD on peritoneal dialysis - resume outpatient protocol Chronic anemia probably from ESRD. Continue iron and B12 supplements. Schizoaffective disorder - continue present medications. On Risperdal.  Discharge Instructions    Medication List    STOP taking these medications        levofloxacin 500 MG tablet  Commonly known as:  LEVAQUIN      TAKE these medications        allopurinol 300 MG tablet  Commonly known as:  ZYLOPRIM  Take 300 mg by mouth daily. Reported on 04/21/2016     cinacalcet 30 MG tablet  Commonly known as:  SENSIPAR  Take 30 mg by mouth 3 (three) times daily.     divalproex 250 MG DR tablet  Commonly known as:  DEPAKOTE  Take 3 tablets (750 mg total) by mouth every 12 (twelve) hours.     ferrous sulfate 325 (65 FE) MG EC tablet  Take 325 mg by mouth 2 (two) times daily.     levETIRAcetam 500 MG tablet  Commonly known as:  KEPPRA  Take 1 tablet (500 mg total) by mouth 2 (two) times daily.     medroxyPROGESTERone 150 MG/ML injection  Commonly known as:  DEPO-PROVERA  Inject 150 mg into the muscle every 3 (three) months.     potassium chloride 20 MEQ packet  Commonly known as:  KLOR-CON  Take 20 mEq by mouth daily.  risperiDONE 3 MG tablet  Commonly known as:  RISPERDAL  Take 3 mg by mouth at bedtime.     RISPERDAL 0.5 MG tablet  Generic drug:  risperiDONE  Take 0.5 mg by mouth 2 (two) times daily as needed. As needed for agitation     sevelamer carbonate 800 MG tablet  Commonly known as:  RENVELA  Take 800 mg by mouth 3 (three) times daily with meals.     cyanocobalamin 500 MCG tablet  Take 500 mcg by mouth daily.     vitamin B-12 500 MCG  tablet  Commonly known as:  CYANOCOBALAMIN  Take 500 mcg by mouth daily.           Follow-up Information    Follow up with [name],GEORGE, MD. Schedule an appointment as soon as possible for a visit in 2 weeks.   Specialty:  Internal Medicine   Contact information:   [id number] [place] [address] [phone]      No Known Allergies  Consultations:    Procedures/Studies: Dg Chest 1 View  04/21/2016  CLINICAL DATA:  Altered mental status EXAM: CHEST 1 VIEW COMPARISON:  09/14/2015 FINDINGS: Hypoventilation and decreased lung volume. Mild bibasilar atelectasis. Negative for heart failure. Negative for pneumonia or effusion IMPRESSION: Hypoventilation with mild bibasilar atelectasis. Electronically Signed   By: Franchot Gallo M.D.   On: 04/21/2016 22:53   Ct Head Wo Contrast  04/21/2016  CLINICAL DATA:  50 year old female with altered mental status EXAM: CT HEAD WITHOUT CONTRAST TECHNIQUE: Contiguous axial images were obtained from the base of the skull through the vertex without intravenous contrast. COMPARISON:  Head CT dated 09/14/2015 and MRI dated 09/15/2015 FINDINGS: The ventricles and the sulci are appropriate in size for the patient's age. There is no intracranial hemorrhage. No midline shift or mass effect identified. The gray-white matter differentiation is preserved. The visualized paranasal sinuses and mastoid air cells are well aerated. The calvarium is intact. IMPRESSION: No acute intracranial pathology. Electronically Signed   By: Anner Crete M.D.   On: 04/21/2016 23:43      Subjective:   Discharge Exam: Filed Vitals:   04/22/16 0751 04/22/16 1105  BP: 129/65 114/76  Pulse: 88 100  Temp: 97.8 F (36.6 C) 97.6 F (36.4 C)  Resp: 20 14   Filed Vitals:   04/22/16 0140 04/22/16 0318 04/22/16 0751 04/22/16 1105  BP: 161/95 141/77 129/65 114/76  Pulse: 94 88 88 100  Temp: 97.8 F (36.6 C) 97.4 F (36.3 C) 97.8 F (36.6 C) 97.6 F  (36.4 C)  TempSrc: Oral Oral Oral Oral  Resp: 25 19 20 14   Height: 5\' 4"  (1.626 m)     Weight: 76.8 kg (169 lb 5 oz)     SpO2: 91% 95% 95% 94%    General: Pt is alert, awake, not in acute distress Cardiovascular: RRR, S1/S2 +, no rubs, no gallops Respiratory: CTA bilaterally, no wheezing, no rhonchi    The results of significant diagnostics from this hospitalization (including imaging, microbiology, ancillary and laboratory) are listed below for reference.     Microbiology: Recent Results (from the past 240 hour(s))  MRSA PCR Screening     Status: None   Collection Time: 04/22/16  2:28 AM  Result Value Ref Range Status   MRSA by PCR NEGATIVE NEGATIVE Final    Comment:        The GeneXpert MRSA Assay (FDA approved for NASAL specimens only), is one component of a comprehensive MRSA  colonization surveillance program. It is not intended to diagnose MRSA infection nor to guide or monitor treatment for MRSA infections.      Labs: BNP (last 3 results) No results for input(s): BNP in the last 8760 hours. Basic Metabolic Panel:  Recent Labs Lab 04/21/16 2147  NA 130*  K 4.2  CL 84*  CO2 27  GLUCOSE 104*  BUN 85*  CREATININE 11.26*  CALCIUM 10.2   Liver Function Tests:  Recent Labs Lab 04/21/16 2147  AST 14*  ALT 8*  ALKPHOS 132*  BILITOT 0.5  PROT 7.7  ALBUMIN 3.4*   No results for input(s): LIPASE, AMYLASE in the last 168 hours.  Recent Labs Lab 04/21/16 2150  AMMONIA 42*   CBC:  Recent Labs Lab 04/21/16 2147  WBC 10.6*  NEUTROABS 7.7  HGB 11.4*  HCT 35.1*  MCV 97.5  PLT 375   Cardiac Enzymes: No results for input(s): CKTOTAL, CKMB, CKMBINDEX, TROPONINI in the last 168 hours. BNP: Invalid input(s): POCBNP CBG: No results for input(s): GLUCAP in the last 168 hours. D-Dimer No results for input(s): DDIMER in the last 72 hours. Hgb A1c No results for input(s): HGBA1C in the last 72 hours. Lipid Profile No results for input(s): CHOL,  HDL, LDLCALC, TRIG, CHOLHDL, LDLDIRECT in the last 72 hours. Thyroid function studies No results for input(s): TSH, T4TOTAL, T3FREE, THYROIDAB in the last 72 hours.  Invalid input(s): FREET3 Anemia work up No results for input(s): VITAMINB12, FOLATE, FERRITIN, TIBC, IRON, RETICCTPCT in the last 72 hours. Urinalysis    Component Value Date/Time   COLORURINE YELLOW 04/21/2016 2142   APPEARANCEUR CLEAR 04/21/2016 2142   LABSPEC 1.012 04/21/2016 2142   PHURINE 7.0 04/21/2016 2142   GLUCOSEU NEGATIVE 04/21/2016 2142   HGBUR NEGATIVE 04/21/2016 2142   HGBUR trace-intact 04/12/2010 0904   BILIRUBINUR NEGATIVE 04/21/2016 2142   [place] NEGATIVE 04/21/2016 2142   PROTEINUR 100* 04/21/2016 2142   UROBILINOGEN 1.0 07/14/2012 2005   NITRITE NEGATIVE 04/21/2016 2142   LEUKOCYTESUR NEGATIVE 04/21/2016 2142   Sepsis Labs Invalid input(s): PROCALCITONIN,  WBC,  LACTICIDVEN Microbiology Recent Results (from the past 240 hour(s))  MRSA PCR Screening     Status: None   Collection Time: 04/22/16  2:28 AM  Result Value Ref Range Status   MRSA by PCR NEGATIVE NEGATIVE Final    Comment:        The GeneXpert MRSA Assay (FDA approved for NASAL specimens only), is one component of a comprehensive MRSA colonization surveillance program. It is not intended to diagnose MRSA infection nor to guide or monitor treatment for MRSA infections.      Time coordinating discharge: Over 30 minutes  SIGNED:  Marzetta Board, MD  [facility] 04/22/2016, 2:39 PM Pager [phone]  If 7PM-7AM, please contact night-coverage [email or web address] Password [email or web address]

## 2016-04-22 NOTE — Progress Notes (Signed)
Patient seen and examined this morning, just admitted by Dr. Raliegh Ip overnight with 2 episodes of seizures, seen by neurology. On my evaluation sister is at bedside and reports that patient is at baseline and asking about home discharge. Will allow po medications, diet and re-evaluate in pm. Discussed with Dr. Nicole Kindred from neurology, if she remains stable and is at baseline OK with discharge. Discussed with Dr. Jonnie Finner from nephrology regarding PD status, appreciate input  Costin M. Cruzita Lederer, MD [facility] [phone]

## 2016-04-22 NOTE — Progress Notes (Addendum)
Patient arrived to 3S03 via stretcher.  Transferred to bed with assist of nursing staff.  ELink and CCMD notified of patient's arrival.  Patient oriented to surroundings and call bell placed within reach, however, there is no evidence of understanding from teaching.  Patient's words are incoherent. No belongings are present with the patient.  Her sister is in the waiting room.  Patient appears calm and relaxed. Will continue to monitor patient.

## 2016-04-22 NOTE — H&P (Signed)
History and Physical    Jane [name] [id number] DOB: Apr 21, 1966 DOA: 04/21/2016  PCP: [name], MD  Patient coming from: Home.  History obtained from patient's sister. Patient is encephalopathic.  Chief Complaint: Seizure.  HPI: Jane [name] is a 50 y.o. female with with known history of seizure on Depakote and Keppra, ESRD on peritoneal dialysis who was recently placed on antibiotics for UTI after patient had abdominal discomfort and nausea vomiting last week which has subsided and resolved was brought to the ER the patient had 2 episodes of seizures. As per patient's sister last evening around 4 PM patient had first episode and 2 hours later patient had another episode. First episode was most likely generalized lasted for less than 30 seconds and second episode was mostly twitching of her lower jaw. In the ER patient looks confused encephalopathic and does not follow commands. Depakote levels are therapeutic. CT head is unremarkable. On-call neurologist has been consulted and patient has been admitted since patient is still postictal.   ED Course: CT head was unremarkable.  Review of Systems: As per HPI, rest all negative.   Past Medical History  Diagnosis Date  . Renal disease   . Hypertension   . Anemia   . Hyperlipemia   . Moderately mentally retarded   . Bipolar disorder ([place])   . Depression   . Hypothyroidism   . Seizures ([place])     No recent seizures - no meds  . Schizophrenia ([place])   . ESRD on peritoneal dialysis [facility])     Past Surgical History  Procedure Laterality Date  . Breast reduction surgery    . Foot surgery      right  . Examination under anesthesia N/A 12/08/2013    Procedure: EXAM UNDER ANESTHESIA;  Surgeon: Lavonia Drafts, MD;  Location: [facility];  Service: Gynecology;  Laterality: N/A;  Pelvic exam and pap smear, unable to tolerate during last office visit  . Breast surgery      breast reduction     reports that she has  never smoked. She has never used smokeless tobacco. She reports that she does not drink alcohol or use illicit drugs.  No Known Allergies  Family History  Problem Relation Age of Onset  . Hypertension Other     Prior to Admission medications   Medication Sig Start Date End Date Taking? Authorizing Provider  allopurinol (ZYLOPRIM) 300 MG tablet Take 300 mg by mouth daily. Reported on 04/21/2016    Historical Provider, MD  cinacalcet (SENSIPAR) 30 MG tablet Take 30 mg by mouth 3 (three) times daily.    Yes Historical Provider, MD  cyanocobalamin 500 MCG tablet Take 500 mcg by mouth daily.   Yes Historical Provider, MD  divalproex (DEPAKOTE) 500 MG DR tablet Take 1 tablet (500 mg total) by mouth 2 (two) times daily. 09/17/15  Yes Charlynne Cousins, MD  ferrous sulfate 325 (65 FE) MG EC tablet Take 325 mg by mouth 2 (two) times daily.    Yes Historical Provider, MD  levETIRAcetam (KEPPRA) 500 MG tablet Take 1 tablet (500 mg total) by mouth 2 (two) times daily. 09/17/15  Yes Charlynne Cousins, MD  levofloxacin (LEVAQUIN) 500 MG tablet Take 1 tablet (500 mg total) by mouth every other day. Patient not taking: Reported on 04/21/2016 09/17/15   Charlynne Cousins, MD  medroxyPROGESTERone (DEPO-PROVERA) 150 MG/ML injection Inject 150 mg into the muscle every 3 (three) months.    Yes Historical Provider, MD  potassium  chloride (KLOR-CON) 20 MEQ packet Take 20 mEq by mouth daily.    Yes Historical Provider, MD  risperiDONE (RISPERDAL) 0.5 MG tablet Take 0.5 mg by mouth 2 (two) times daily as needed. As needed for agitation   Yes Historical Provider, MD  risperiDONE (RISPERDAL) 3 MG tablet Take 3 mg by mouth at bedtime.   Yes Historical Provider, MD  sevelamer carbonate (RENVELA) 800 MG tablet Take 800 mg by mouth 3 (three) times daily with meals.   Yes Historical Provider, MD  vitamin B-12 (CYANOCOBALAMIN) 500 MCG tablet Take 500 mcg by mouth daily.   Yes Historical Provider, MD    Physical  Exam: Filed Vitals:   04/22/16 0045 04/22/16 0115 04/22/16 0140 04/22/16 0318  BP: 139/83 135/82 161/95 141/77  Pulse: 93  94 88  Temp:   97.8 F (36.6 C) 97.4 F (36.3 C)  TempSrc:   Oral Oral  Resp: 25 25 25 19   Height:   5\' 4"  (1.626 m)   Weight:   169 lb 5 oz (76.8 kg)   SpO2: 94%  91% 95%      Constitutional: Not in distress. Filed Vitals:   04/22/16 0045 04/22/16 0115 04/22/16 0140 04/22/16 0318  BP: 139/83 135/82 161/95 141/77  Pulse: 93  94 88  Temp:   97.8 F (36.6 C) 97.4 F (36.3 C)  TempSrc:   Oral Oral  Resp: 25 25 25 19   Height:   5\' 4"  (1.626 m)   Weight:   169 lb 5 oz (76.8 kg)   SpO2: 94%  91% 95%   Eyes: [place] positive. ENMT: No discharge from the ears eyes nose and mouth. Neck: No neck rigidity. No mass felt. No JVD appreciated. Respiratory: No rhonchi or crepitations. Cardiovascular: S1 and S2 heard. Abdomen: Peritoneal dialysis catheter seen no surrounding inflammation seen. Soft nontender. Musculoskeletal: No edema. Skin: No obvious rash. Neurologic: Patient is encephalopathic and does not answer questions. Psychiatric: Patient is confused.   Labs on Admission: I have personally reviewed following labs and imaging studies  CBC:  Recent Labs Lab 04/21/16 2147  WBC 10.6*  NEUTROABS 7.7  HGB 11.4*  HCT 35.1*  MCV 97.5  PLT 123456   Basic Metabolic Panel:  Recent Labs Lab 04/21/16 2147  NA 130*  K 4.2  CL 84*  CO2 27  GLUCOSE 104*  BUN 85*  CREATININE 11.26*  CALCIUM 10.2   GFR: Estimated Creatinine Clearance: 6.1 mL/min (by C-G formula based on Cr of 11.26). Liver Function Tests:  Recent Labs Lab 04/21/16 2147  AST 14*  ALT 8*  ALKPHOS 132*  BILITOT 0.5  PROT 7.7  ALBUMIN 3.4*   No results for input(s): LIPASE, AMYLASE in the last 168 hours.  Recent Labs Lab 04/21/16 2150  AMMONIA 42*   Coagulation Profile: No results for input(s): INR, PROTIME in the last 168 hours. Cardiac Enzymes: No results for  input(s): CKTOTAL, CKMB, CKMBINDEX, TROPONINI in the last 168 hours. BNP (last 3 results) No results for input(s): PROBNP in the last 8760 hours. HbA1C: No results for input(s): HGBA1C in the last 72 hours. CBG: No results for input(s): GLUCAP in the last 168 hours. Lipid Profile: No results for input(s): CHOL, HDL, LDLCALC, TRIG, CHOLHDL, LDLDIRECT in the last 72 hours. Thyroid Function Tests: No results for input(s): TSH, T4TOTAL, FREET4, T3FREE, THYROIDAB in the last 72 hours. Anemia Panel: No results for input(s): VITAMINB12, FOLATE, FERRITIN, TIBC, IRON, RETICCTPCT in the last 72 hours. Urine analysis:    Component  Value Date/Time   COLORURINE YELLOW 04/21/2016 2142   APPEARANCEUR CLEAR 04/21/2016 2142   LABSPEC 1.012 04/21/2016 2142   PHURINE 7.0 04/21/2016 2142   GLUCOSEU NEGATIVE 04/21/2016 2142   HGBUR NEGATIVE 04/21/2016 2142   HGBUR trace-intact 04/12/2010 0904   BILIRUBINUR NEGATIVE 04/21/2016 2142   [place] NEGATIVE 04/21/2016 2142   PROTEINUR 100* 04/21/2016 2142   UROBILINOGEN 1.0 07/14/2012 2005   NITRITE NEGATIVE 04/21/2016 2142   LEUKOCYTESUR NEGATIVE 04/21/2016 2142   Sepsis Labs: @LABRCNTIP (procalcitonin:4,lacticidven:4) )No results found for this or any previous visit (from the past 240 hour(s)).   Radiological Exams on Admission: Dg Chest 1 View  04/21/2016  CLINICAL DATA:  Altered mental status EXAM: CHEST 1 VIEW COMPARISON:  09/14/2015 FINDINGS: Hypoventilation and decreased lung volume. Mild bibasilar atelectasis. Negative for heart failure. Negative for pneumonia or effusion IMPRESSION: Hypoventilation with mild bibasilar atelectasis. Electronically Signed   By: Franchot Gallo M.D.   On: 04/21/2016 22:53   Ct Head Wo Contrast  04/21/2016  CLINICAL DATA:  50 year old female with altered mental status EXAM: CT HEAD WITHOUT CONTRAST TECHNIQUE: Contiguous axial images were obtained from the base of the skull through the vertex without intravenous  contrast. COMPARISON:  Head CT dated 09/14/2015 and MRI dated 09/15/2015 FINDINGS: The ventricles and the sulci are appropriate in size for the patient's age. There is no intracranial hemorrhage. No midline shift or mass effect identified. The gray-white matter differentiation is preserved. The visualized paranasal sinuses and mastoid air cells are well aerated. The calvarium is intact. IMPRESSION: No acute intracranial pathology. Electronically Signed   By: Anner Crete M.D.   On: 04/21/2016 23:43    Assessment/Plan Principal Problem:   Seizure disorder [facility]) Active Problems:   Schizoaffective disorder ([facility])   Essential hypertension   Seizure ([place])   End-stage renal disease on peritoneal dialysis ([place])   Acute encephalopathy    1. Recurrent seizures with known history of seizures - appreciate neurology consult. Neurologist has recommended to increase Depakote dose from 500 mg twice a day to 750 twice a day. I'll place patient on IV dosing until patient can take reliably orally of both Keppra and Depakote. 2. Hypertension - as per patient's sister patient has not been taking antihypertensives recently because patient's blood pressure improved after dialysis. For now patient's blood pressure is elevated for which I have placed patient on when necessary IV hydralazine. 3. ESRD on peritoneal dialysis - please notify nephrology in a.m. Follow metabolic panel. 4. Chronic anemia probably from ESRD. Continue iron and B12 supplements. 5. Schizoaffective disorder - continue present medications. On Risperdal.   DVT prophylaxis: Heparin. Code Status: Full code.  Family Communication: Patient's sister.  Disposition Plan: Home.  Consults called: Neurology.  Admission status: Observation. Step down.    Rise Patience MD [facility] Pager [phone].  If 7PM-7AM, please contact night-coverage [email or web address] Password [email or web address]  04/22/2016, 3:53 AM

## 2016-04-22 NOTE — Discharge Instructions (Signed)
Follow with Jane [name],GEORGE, MD in 5-7 days  Please get a complete blood count and chemistry panel checked by your Primary MD at your next visit, and again as instructed by your Primary MD. Please get your medications reviewed and adjusted by your Primary MD.  Please request your Primary MD to go over all Hospital Tests and Procedure/Radiological results at the follow up, please get all Hospital records sent to your Prim MD by signing hospital release before you go home.  If you had Pneumonia of Lung problems at the Hospital: Please get a 2 view Chest X ray done in 6-8 weeks after hospital discharge or sooner if instructed by your Primary MD.  If you have Congestive Heart Failure: Please call your Cardiologist or Primary MD anytime you have any of the following symptoms:  1) 3 pound weight gain in 24 hours or 5 pounds in 1 week  2) shortness of breath, with or without a dry hacking cough  3) swelling in the hands, feet or stomach  4) if you have to sleep on extra pillows at night in order to breathe  Follow cardiac low salt diet and 1.5 lit/day fluid restriction.  If you have diabetes Accuchecks 4 times/day, Once in AM empty stomach and then before each meal. Log in all results and show them to your primary doctor at your next visit. If any glucose reading is under 80 or above 300 call your primary MD immediately.  If you have Seizure/Convulsions/Epilepsy: Please do not drive, operate heavy machinery, participate in activities at heights or participate in high speed sports until you have seen by Primary MD or a Neurologist and advised to do so again.  If you had Gastrointestinal Bleeding: Please ask your Primary MD to check a complete blood count within one week of discharge or at your next visit. Your endoscopic/colonoscopic biopsies that are pending at the time of discharge, will also need to followed by your Primary MD.  Get Medicines reviewed and adjusted. Please take all your  medications with you for your next visit with your Primary MD  Please request your Primary MD to go over all hospital tests and procedure/radiological results at the follow up, please ask your Primary MD to get all Hospital records sent to his/her office.  If you experience worsening of your admission symptoms, develop shortness of breath, life threatening emergency, suicidal or homicidal thoughts you must seek medical attention immediately by calling 911 or calling your MD immediately  if symptoms less severe.  You must read complete instructions/literature along with all the possible adverse reactions/side effects for all the Medicines you take and that have been prescribed to you. Take any new Medicines after you have completely understood and accpet all the possible adverse reactions/side effects.   Do not drive or operate heavy machinery when taking Pain medications.   Do not take more than prescribed Pain, Sleep and Anxiety Medications  Special Instructions: If you have smoked or chewed Tobacco  in the last 2 yrs please stop smoking, stop any regular Alcohol  and or any Recreational drug use.  Wear Seat belts while driving.  Please note You were cared for by a hospitalist during your hospital stay. If you have any questions about your discharge medications or the care you received while you were in the hospital after you are discharged, you can call the unit and asked to speak with the hospitalist on call if the hospitalist that took care of you is not available. Once you  are discharged, your primary care physician will handle any further medical issues. Please note that NO REFILLS for any discharge medications will be authorized once you are discharged, as it is imperative that you return to your primary care physician (or establish a relationship with a primary care physician if you do not have one) for your aftercare needs so that they can reassess your need for medications and monitor your  lab values.  You can reach the hospitalist office at phone [phone] or fax [phone]   If you do not have a primary care physician, you can call [phone] for a physician referral.  Activity: As tolerated with Full fall precautions use walker/cane & assistance as needed  Diet: renal  Disposition Home   Epilepsy People with epilepsy have times when they shake and jerk uncontrollably (seizures). This happens when there is a sudden change in brain function. Epilepsy may have many possible causes. Anything that disturbs the normal pattern of brain cell activity can lead to seizures. HOME CARE   Follow your doctor's instructions about driving and safety during normal activities.  Get enough sleep.  Only take medicine as told by your doctor.  Avoid things that you know can cause you to have seizures (triggers).  Write down when your seizures happen and what you remember about each seizure. Write down anything you think may have caused the seizure to happen.  Tell the people you live and work with that you have seizures. Make sure they know how to help you. They should:  Cushion your head and body.  Turn you on your side.  Not restrain you.  Not place anything inside your mouth.  Call for local emergency medical help if there is any question about what has happened.  Keep all follow-up visits with your doctor. This is very important. GET HELP IF:  You get an infection or start to feel sick. You may have more seizures when you are sick.  You are having seizures more often.  Your seizure pattern is changing. GET HELP RIGHT AWAY IF:   A seizure does not stop after a few seconds or minutes.  A seizure causes you to have trouble breathing.  A seizure gives you a very bad headache.  A seizure makes you unable to speak or use a part of your body.   This information is not intended to replace advice given to you by your health care provider. Make sure you discuss any  questions you have with your health care provider.   Document Released: 08/06/2009 Document Revised: 07/30/2013 Document Reviewed: 05/21/2013 Elsevier Interactive Patient Education [employer].

## 2016-04-22 NOTE — Progress Notes (Signed)
Discharge instructions discussed and explained to patient and family. Prescription and follow up visit given to family. Pt going home with family via w/c with belongings.

## 2016-04-23 DIAGNOSIS — N186 End stage renal disease: Secondary | ICD-10-CM | POA: Diagnosis not present

## 2016-04-23 DIAGNOSIS — D631 Anemia in chronic kidney disease: Secondary | ICD-10-CM | POA: Diagnosis not present

## 2016-04-23 DIAGNOSIS — N2581 Secondary hyperparathyroidism of renal origin: Secondary | ICD-10-CM | POA: Diagnosis not present

## 2016-04-23 LAB — URINE CULTURE: Culture: <10000 — AB

## 2016-04-24 DIAGNOSIS — N186 End stage renal disease: Secondary | ICD-10-CM | POA: Diagnosis not present

## 2016-04-24 DIAGNOSIS — N2581 Secondary hyperparathyroidism of renal origin: Secondary | ICD-10-CM | POA: Diagnosis not present

## 2016-04-24 DIAGNOSIS — D631 Anemia in chronic kidney disease: Secondary | ICD-10-CM | POA: Diagnosis not present

## 2016-04-25 DIAGNOSIS — N186 End stage renal disease: Secondary | ICD-10-CM | POA: Diagnosis not present

## 2016-04-25 DIAGNOSIS — N2581 Secondary hyperparathyroidism of renal origin: Secondary | ICD-10-CM | POA: Diagnosis not present

## 2016-04-25 DIAGNOSIS — D631 Anemia in chronic kidney disease: Secondary | ICD-10-CM | POA: Diagnosis not present

## 2016-04-26 DIAGNOSIS — N2581 Secondary hyperparathyroidism of renal origin: Secondary | ICD-10-CM | POA: Diagnosis not present

## 2016-04-26 DIAGNOSIS — N186 End stage renal disease: Secondary | ICD-10-CM | POA: Diagnosis not present

## 2016-04-26 DIAGNOSIS — D631 Anemia in chronic kidney disease: Secondary | ICD-10-CM | POA: Diagnosis not present

## 2016-04-26 LAB — CULTURE, BLOOD (ROUTINE X 2)
Culture: NO GROWTH
Culture: NO GROWTH

## 2016-04-27 DIAGNOSIS — N186 End stage renal disease: Secondary | ICD-10-CM | POA: Diagnosis not present

## 2016-04-27 DIAGNOSIS — N2581 Secondary hyperparathyroidism of renal origin: Secondary | ICD-10-CM | POA: Diagnosis not present

## 2016-04-27 DIAGNOSIS — D631 Anemia in chronic kidney disease: Secondary | ICD-10-CM | POA: Diagnosis not present

## 2016-04-28 DIAGNOSIS — N2581 Secondary hyperparathyroidism of renal origin: Secondary | ICD-10-CM | POA: Diagnosis not present

## 2016-04-28 DIAGNOSIS — D631 Anemia in chronic kidney disease: Secondary | ICD-10-CM | POA: Diagnosis not present

## 2016-04-28 DIAGNOSIS — N186 End stage renal disease: Secondary | ICD-10-CM | POA: Diagnosis not present

## 2016-04-29 DIAGNOSIS — D631 Anemia in chronic kidney disease: Secondary | ICD-10-CM | POA: Diagnosis not present

## 2016-04-29 DIAGNOSIS — N2581 Secondary hyperparathyroidism of renal origin: Secondary | ICD-10-CM | POA: Diagnosis not present

## 2016-04-29 DIAGNOSIS — N186 End stage renal disease: Secondary | ICD-10-CM | POA: Diagnosis not present

## 2016-04-30 DIAGNOSIS — N186 End stage renal disease: Secondary | ICD-10-CM | POA: Diagnosis not present

## 2016-04-30 DIAGNOSIS — D631 Anemia in chronic kidney disease: Secondary | ICD-10-CM | POA: Diagnosis not present

## 2016-04-30 DIAGNOSIS — N2581 Secondary hyperparathyroidism of renal origin: Secondary | ICD-10-CM | POA: Diagnosis not present

## 2016-05-01 DIAGNOSIS — N186 End stage renal disease: Secondary | ICD-10-CM | POA: Diagnosis not present

## 2016-05-01 DIAGNOSIS — N2581 Secondary hyperparathyroidism of renal origin: Secondary | ICD-10-CM | POA: Diagnosis not present

## 2016-05-01 DIAGNOSIS — D631 Anemia in chronic kidney disease: Secondary | ICD-10-CM | POA: Diagnosis not present

## 2016-05-02 DIAGNOSIS — N186 End stage renal disease: Secondary | ICD-10-CM | POA: Diagnosis not present

## 2016-05-02 DIAGNOSIS — D631 Anemia in chronic kidney disease: Secondary | ICD-10-CM | POA: Diagnosis not present

## 2016-05-02 DIAGNOSIS — N2581 Secondary hyperparathyroidism of renal origin: Secondary | ICD-10-CM | POA: Diagnosis not present

## 2016-05-03 DIAGNOSIS — N186 End stage renal disease: Secondary | ICD-10-CM | POA: Diagnosis not present

## 2016-05-03 DIAGNOSIS — N2581 Secondary hyperparathyroidism of renal origin: Secondary | ICD-10-CM | POA: Diagnosis not present

## 2016-05-03 DIAGNOSIS — D631 Anemia in chronic kidney disease: Secondary | ICD-10-CM | POA: Diagnosis not present

## 2016-05-04 DIAGNOSIS — D631 Anemia in chronic kidney disease: Secondary | ICD-10-CM | POA: Diagnosis not present

## 2016-05-04 DIAGNOSIS — N186 End stage renal disease: Secondary | ICD-10-CM | POA: Diagnosis not present

## 2016-05-04 DIAGNOSIS — N2581 Secondary hyperparathyroidism of renal origin: Secondary | ICD-10-CM | POA: Diagnosis not present

## 2016-05-05 DIAGNOSIS — Z79899 Other long term (current) drug therapy: Secondary | ICD-10-CM | POA: Diagnosis not present

## 2016-05-05 DIAGNOSIS — N186 End stage renal disease: Secondary | ICD-10-CM | POA: Diagnosis not present

## 2016-05-05 DIAGNOSIS — D631 Anemia in chronic kidney disease: Secondary | ICD-10-CM | POA: Diagnosis not present

## 2016-05-05 DIAGNOSIS — N2581 Secondary hyperparathyroidism of renal origin: Secondary | ICD-10-CM | POA: Diagnosis not present

## 2016-05-06 DIAGNOSIS — N186 End stage renal disease: Secondary | ICD-10-CM | POA: Diagnosis not present

## 2016-05-06 DIAGNOSIS — N2581 Secondary hyperparathyroidism of renal origin: Secondary | ICD-10-CM | POA: Diagnosis not present

## 2016-05-06 DIAGNOSIS — D631 Anemia in chronic kidney disease: Secondary | ICD-10-CM | POA: Diagnosis not present

## 2016-05-07 DIAGNOSIS — D631 Anemia in chronic kidney disease: Secondary | ICD-10-CM | POA: Diagnosis not present

## 2016-05-07 DIAGNOSIS — N2581 Secondary hyperparathyroidism of renal origin: Secondary | ICD-10-CM | POA: Diagnosis not present

## 2016-05-07 DIAGNOSIS — N186 End stage renal disease: Secondary | ICD-10-CM | POA: Diagnosis not present

## 2016-05-08 DIAGNOSIS — D631 Anemia in chronic kidney disease: Secondary | ICD-10-CM | POA: Diagnosis not present

## 2016-05-08 DIAGNOSIS — N2581 Secondary hyperparathyroidism of renal origin: Secondary | ICD-10-CM | POA: Diagnosis not present

## 2016-05-08 DIAGNOSIS — N186 End stage renal disease: Secondary | ICD-10-CM | POA: Diagnosis not present

## 2016-05-09 DIAGNOSIS — N186 End stage renal disease: Secondary | ICD-10-CM | POA: Diagnosis not present

## 2016-05-09 DIAGNOSIS — N2581 Secondary hyperparathyroidism of renal origin: Secondary | ICD-10-CM | POA: Diagnosis not present

## 2016-05-09 DIAGNOSIS — D631 Anemia in chronic kidney disease: Secondary | ICD-10-CM | POA: Diagnosis not present

## 2016-05-10 DIAGNOSIS — D631 Anemia in chronic kidney disease: Secondary | ICD-10-CM | POA: Diagnosis not present

## 2016-05-10 DIAGNOSIS — N186 End stage renal disease: Secondary | ICD-10-CM | POA: Diagnosis not present

## 2016-05-10 DIAGNOSIS — N2581 Secondary hyperparathyroidism of renal origin: Secondary | ICD-10-CM | POA: Diagnosis not present

## 2016-05-11 DIAGNOSIS — N186 End stage renal disease: Secondary | ICD-10-CM | POA: Diagnosis not present

## 2016-05-11 DIAGNOSIS — N2581 Secondary hyperparathyroidism of renal origin: Secondary | ICD-10-CM | POA: Diagnosis not present

## 2016-05-11 DIAGNOSIS — D631 Anemia in chronic kidney disease: Secondary | ICD-10-CM | POA: Diagnosis not present

## 2016-05-12 DIAGNOSIS — N2581 Secondary hyperparathyroidism of renal origin: Secondary | ICD-10-CM | POA: Diagnosis not present

## 2016-05-12 DIAGNOSIS — D631 Anemia in chronic kidney disease: Secondary | ICD-10-CM | POA: Diagnosis not present

## 2016-05-12 DIAGNOSIS — N186 End stage renal disease: Secondary | ICD-10-CM | POA: Diagnosis not present

## 2016-05-13 DIAGNOSIS — D631 Anemia in chronic kidney disease: Secondary | ICD-10-CM | POA: Diagnosis not present

## 2016-05-13 DIAGNOSIS — N2581 Secondary hyperparathyroidism of renal origin: Secondary | ICD-10-CM | POA: Diagnosis not present

## 2016-05-13 DIAGNOSIS — N186 End stage renal disease: Secondary | ICD-10-CM | POA: Diagnosis not present

## 2016-05-14 DIAGNOSIS — N2581 Secondary hyperparathyroidism of renal origin: Secondary | ICD-10-CM | POA: Diagnosis not present

## 2016-05-14 DIAGNOSIS — D631 Anemia in chronic kidney disease: Secondary | ICD-10-CM | POA: Diagnosis not present

## 2016-05-14 DIAGNOSIS — N186 End stage renal disease: Secondary | ICD-10-CM | POA: Diagnosis not present

## 2016-05-15 DIAGNOSIS — N2581 Secondary hyperparathyroidism of renal origin: Secondary | ICD-10-CM | POA: Diagnosis not present

## 2016-05-15 DIAGNOSIS — D631 Anemia in chronic kidney disease: Secondary | ICD-10-CM | POA: Diagnosis not present

## 2016-05-15 DIAGNOSIS — N186 End stage renal disease: Secondary | ICD-10-CM | POA: Diagnosis not present

## 2016-05-16 DIAGNOSIS — N2581 Secondary hyperparathyroidism of renal origin: Secondary | ICD-10-CM | POA: Diagnosis not present

## 2016-05-16 DIAGNOSIS — D631 Anemia in chronic kidney disease: Secondary | ICD-10-CM | POA: Diagnosis not present

## 2016-05-16 DIAGNOSIS — N186 End stage renal disease: Secondary | ICD-10-CM | POA: Diagnosis not present

## 2016-05-17 DIAGNOSIS — D631 Anemia in chronic kidney disease: Secondary | ICD-10-CM | POA: Diagnosis not present

## 2016-05-17 DIAGNOSIS — N186 End stage renal disease: Secondary | ICD-10-CM | POA: Diagnosis not present

## 2016-05-17 DIAGNOSIS — N2581 Secondary hyperparathyroidism of renal origin: Secondary | ICD-10-CM | POA: Diagnosis not present

## 2016-05-18 DIAGNOSIS — N2581 Secondary hyperparathyroidism of renal origin: Secondary | ICD-10-CM | POA: Diagnosis not present

## 2016-05-18 DIAGNOSIS — D631 Anemia in chronic kidney disease: Secondary | ICD-10-CM | POA: Diagnosis not present

## 2016-05-18 DIAGNOSIS — N186 End stage renal disease: Secondary | ICD-10-CM | POA: Diagnosis not present

## 2016-05-19 DIAGNOSIS — N186 End stage renal disease: Secondary | ICD-10-CM | POA: Diagnosis not present

## 2016-05-19 DIAGNOSIS — N2581 Secondary hyperparathyroidism of renal origin: Secondary | ICD-10-CM | POA: Diagnosis not present

## 2016-05-19 DIAGNOSIS — D631 Anemia in chronic kidney disease: Secondary | ICD-10-CM | POA: Diagnosis not present

## 2016-05-20 DIAGNOSIS — N2581 Secondary hyperparathyroidism of renal origin: Secondary | ICD-10-CM | POA: Diagnosis not present

## 2016-05-20 DIAGNOSIS — N186 End stage renal disease: Secondary | ICD-10-CM | POA: Diagnosis not present

## 2016-05-20 DIAGNOSIS — D631 Anemia in chronic kidney disease: Secondary | ICD-10-CM | POA: Diagnosis not present

## 2016-05-21 DIAGNOSIS — D631 Anemia in chronic kidney disease: Secondary | ICD-10-CM | POA: Diagnosis not present

## 2016-05-21 DIAGNOSIS — N2581 Secondary hyperparathyroidism of renal origin: Secondary | ICD-10-CM | POA: Diagnosis not present

## 2016-05-21 DIAGNOSIS — N186 End stage renal disease: Secondary | ICD-10-CM | POA: Diagnosis not present

## 2016-05-22 DIAGNOSIS — N2581 Secondary hyperparathyroidism of renal origin: Secondary | ICD-10-CM | POA: Diagnosis not present

## 2016-05-22 DIAGNOSIS — Z992 Dependence on renal dialysis: Secondary | ICD-10-CM | POA: Diagnosis not present

## 2016-05-22 DIAGNOSIS — D631 Anemia in chronic kidney disease: Secondary | ICD-10-CM | POA: Diagnosis not present

## 2016-05-22 DIAGNOSIS — N186 End stage renal disease: Secondary | ICD-10-CM | POA: Diagnosis not present

## 2016-05-23 DIAGNOSIS — N2581 Secondary hyperparathyroidism of renal origin: Secondary | ICD-10-CM | POA: Diagnosis not present

## 2016-05-23 DIAGNOSIS — D631 Anemia in chronic kidney disease: Secondary | ICD-10-CM | POA: Diagnosis not present

## 2016-05-23 DIAGNOSIS — N186 End stage renal disease: Secondary | ICD-10-CM | POA: Diagnosis not present

## 2016-05-25 DIAGNOSIS — E0590 Thyrotoxicosis, unspecified without thyrotoxic crisis or storm: Secondary | ICD-10-CM | POA: Diagnosis not present

## 2016-06-20 ENCOUNTER — Emergency Department (HOSPITAL_COMMUNITY)
Admission: EM | Admit: 2016-06-20 | Discharge: 2016-06-21 | Disposition: A | Discharge status: Home / Self Care | Payer: Medicare Other | Attending: Emergency Medicine | Admitting: Emergency Medicine

## 2016-06-20 ENCOUNTER — Encounter (HOSPITAL_COMMUNITY): Payer: Self-pay

## 2016-06-20 DIAGNOSIS — I951 Orthostatic hypotension: Secondary | ICD-10-CM | POA: Diagnosis not present

## 2016-06-20 DIAGNOSIS — Z992 Dependence on renal dialysis: Secondary | ICD-10-CM | POA: Diagnosis not present

## 2016-06-20 DIAGNOSIS — K529 Noninfective gastroenteritis and colitis, unspecified: Secondary | ICD-10-CM | POA: Insufficient documentation

## 2016-06-20 DIAGNOSIS — E039 Hypothyroidism, unspecified: Secondary | ICD-10-CM | POA: Insufficient documentation

## 2016-06-20 DIAGNOSIS — N186 End stage renal disease: Secondary | ICD-10-CM | POA: Diagnosis not present

## 2016-06-20 DIAGNOSIS — I120 Hypertensive chronic kidney disease with stage 5 chronic kidney disease or end stage renal disease: Secondary | ICD-10-CM | POA: Insufficient documentation

## 2016-06-20 DIAGNOSIS — R1084 Generalized abdominal pain: Secondary | ICD-10-CM

## 2016-06-20 DIAGNOSIS — Z79899 Other long term (current) drug therapy: Secondary | ICD-10-CM | POA: Diagnosis not present

## 2016-06-20 DIAGNOSIS — E785 Hyperlipidemia, unspecified: Secondary | ICD-10-CM | POA: Diagnosis not present

## 2016-06-20 DIAGNOSIS — I119 Hypertensive heart disease without heart failure: Secondary | ICD-10-CM | POA: Diagnosis not present

## 2016-06-20 DIAGNOSIS — I10 Essential (primary) hypertension: Secondary | ICD-10-CM | POA: Diagnosis not present

## 2016-06-20 DIAGNOSIS — R569 Unspecified convulsions: Secondary | ICD-10-CM | POA: Diagnosis not present

## 2016-06-20 DIAGNOSIS — R1013 Epigastric pain: Secondary | ICD-10-CM | POA: Diagnosis present

## 2016-06-20 DIAGNOSIS — E860 Dehydration: Secondary | ICD-10-CM | POA: Diagnosis not present

## 2016-06-20 LAB — COMPREHENSIVE METABOLIC PANEL
ALT: 14 U/L (ref 14–54)
AST: 20 U/L (ref 15–41)
Albumin: 3.2 g/dL — ABNORMAL LOW (ref 3.5–5.0)
Alkaline Phosphatase: 108 U/L (ref 38–126)
Anion gap: 15 (ref 5–15)
BUN: 66 mg/dL — ABNORMAL HIGH (ref 6–20)
CO2: 27 mmol/L (ref 22–32)
Calcium: 8.9 mg/dL (ref 8.9–10.3)
Chloride: 91 mmol/L — ABNORMAL LOW (ref 101–111)
Creatinine, Ser: 11.99 mg/dL — ABNORMAL HIGH (ref 0.44–1.00)
GFR calc Af Amer: 4 mL/min — ABNORMAL LOW (ref >60)
GFR calc non Af Amer: 3 mL/min — ABNORMAL LOW (ref >60)
Glucose, Bld: 98 mg/dL (ref 65–99)
Potassium: 5.5 mmol/L — ABNORMAL HIGH (ref 3.5–5.1)
Sodium: 133 mmol/L — ABNORMAL LOW (ref 135–145)
Total Bilirubin: 0.6 mg/dL (ref 0.3–1.2)
Total Protein: 7.4 g/dL (ref 6.5–8.1)

## 2016-06-20 LAB — I-STAT CG4 LACTIC ACID, ED
LACTIC ACID, VENOUS: 2.6 mmol/L — AB (ref 0.5–1.9)
Lactic Acid, Venous: 1.66 mmol/L (ref 0.5–1.9)
Lactic Acid, Venous: 1.65 mmol/L (ref 0.5–1.9)

## 2016-06-20 LAB — URINALYSIS, ROUTINE W REFLEX MICROSCOPIC
Bilirubin Urine: NEGATIVE
Glucose, UA: NEGATIVE mg/dL
Hgb urine dipstick: NEGATIVE
Ketones, ur: NEGATIVE mg/dL
Leukocytes, UA: NEGATIVE
Nitrite: NEGATIVE
Protein, ur: NEGATIVE mg/dL
Specific Gravity, Urine: 1.011 (ref 1.005–1.030)
pH: 8 (ref 5.0–8.0)

## 2016-06-20 LAB — CBC
HCT: 33.6 % — ABNORMAL LOW (ref 36.0–46.0)
Hemoglobin: 10.7 g/dL — ABNORMAL LOW (ref 12.0–15.0)
MCH: 33.1 pg (ref 26.0–34.0)
MCHC: 31.8 g/dL (ref 30.0–36.0)
MCV: 104 fL — ABNORMAL HIGH (ref 78.0–100.0)
Platelets: 420 10*3/uL — ABNORMAL HIGH (ref 150–400)
RBC: 3.23 MIL/uL — ABNORMAL LOW (ref 3.87–5.11)
RDW: 14.7 % (ref 11.5–15.5)
WBC: 8.8 10*3/uL (ref 4.0–10.5)

## 2016-06-20 LAB — LIPASE, BLOOD: Lipase: 24 U/L (ref 11–51)

## 2016-06-20 MED ORDER — SODIUM CHLORIDE 0.9 % IV BOLUS (SEPSIS)
1000.0000 mL | Freq: Once | INTRAVENOUS | Status: AC
  Administered 2016-06-20: 1000 mL via INTRAVENOUS

## 2016-06-20 MED ORDER — SODIUM CHLORIDE 0.9 % IV BOLUS (SEPSIS): 500.0000 mL | Freq: Once | INTRAVENOUS | Status: DC

## 2016-06-20 MED ORDER — ONDANSETRON HCL 4 MG/2ML IJ SOLN
4.0000 mg | Freq: Once | INTRAMUSCULAR | Status: AC
  Administered 2016-06-20: 4 mg via INTRAVENOUS
  Filled 2016-06-20: qty 2

## 2016-06-20 MED ORDER — MORPHINE SULFATE (PF) 4 MG/ML IV SOLN
4.0000 mg | Freq: Once | INTRAVENOUS | Status: AC
  Administered 2016-06-20: 4 mg via INTRAVENOUS
  Filled 2016-06-20: qty 1

## 2016-06-20 NOTE — ED Provider Notes (Signed)
[facility] DEPT Provider Note   CSN: [id number] Arrival date & time: 06/20/16  1334     History   Chief Complaint Chief Complaint  Patient presents with  . Abdominal Pain  . Diarrhea    HPI Jane [name] is a 50 y.o. female.  HPI  Patient is a 50 year old female with history of end-stage renal disease on peritoneal dialysis, seizure disorder, mental retardation, bipolar and schizophrenia, hypertension, hyperlipidemia, patient is accompanied by a family member, she was sent to the ER by her primary care doctor for evaluation of low blood pressure secondary to nausea, vomiting and diarrhea for the past 3 days. She complains of abdominal pain, points to her central abdomen and epigastric area, has had intermittent diarrhea for 3 days.  No melena or hematochezia.  She had one episode of vomiting in the ER, emesis non-bloody, non-bilious.  Her sister helps take care of her and reports multiple near syncopal episodes with falling at home over the past 2-3 days because of low blood pressure.  She has not injured herself, her sister denies head injury, denies LOC.  No fever, sweats, chills.  Se has not complained of CP, SOB, peripheral edema.  She does PD at home 3x a day.  BP's were soft in the PCP's office, Dr. Vista Lawman, so she was sent to the ER for evaluation.  No sick contacts.   Past Medical History:  Diagnosis Date  . Anemia   . Bipolar disorder ([place])   . Depression   . ESRD on peritoneal dialysis ([place])   . Hyperlipemia   . Hypertension   . Hypothyroidism   . Moderately mentally retarded   . Renal disease   . Schizophrenia ([place])   . Seizures ([place])    No recent seizures - no meds    Patient Active Problem List   Diagnosis Date Noted  . Seizure disorder ([place]) 04/22/2016  . Acute encephalopathy 04/22/2016  . End-stage renal disease on peritoneal dialysis ([place])   . Partial seizure ([place])   . Seizure ([place]) 09/14/2015  . Altered mental status 07/15/2012  . Hepatic  encephalopathy ([place]) 07/15/2012  . Essential hypertension 10/11/2010  . FOOT PAIN, RIGHT 10/11/2010  . BRONCHITIS, ACUTE WITH MILD BRONCHOSPASM 10/04/2010  . ANKLE SPRAIN, RIGHT 02/20/2010  . AGITATION 06/17/2009  . WEIGHT GAIN 06/17/2009  . Dermatophytosis of foot 04/16/2009  . VAGINAL DISCHARGE 04/16/2009  . INSOMNIA 04/16/2009  . POLYDIPSIA 04/16/2009  . FREQUENCY, URINARY 04/16/2009  . ANEMIA, IRON DEFICIENCY 12/03/2007  . SEIZURE DISORDER 09/03/2007  . DYSLIPIDEMIA 06/11/2007  . OBESITY NOS 06/11/2007  . Schizoaffective disorder ([place]) 06/11/2007  . DISORDER, BIPOLAR NOS 06/11/2007  . RETARDATION, MENTAL NOS 06/11/2007  . RENAL INSUFFICIENCY, CHRONIC 06/11/2007  . ACNE NEC 06/11/2007  . URINARY INCONTINENCE, URGE 06/11/2007  . REDUCTION MAMMOPLASTY, HX OF 06/11/2007    Past Surgical History:  Procedure Laterality Date  . BREAST REDUCTION SURGERY    . BREAST SURGERY     breast reduction  . EXAMINATION UNDER ANESTHESIA N/A 12/08/2013   Procedure: EXAM UNDER ANESTHESIA;  Surgeon: Lavonia Drafts, MD;  Location: [facility];  Service: Gynecology;  Laterality: N/A;  Pelvic exam and pap smear, unable to tolerate during last office visit  . FOOT SURGERY     right    OB History    Gravida Para Term Preterm AB Living   0 0 0 0 0 0   SAB TAB Ectopic Multiple Live Births   0 0 0 0  Home Medications    Prior to Admission medications   Medication Sig Start Date End Date Taking? Authorizing Provider  allopurinol (ZYLOPRIM) 300 MG tablet Take 300 mg by mouth daily. Reported on 04/21/2016   Yes Historical Provider, MD  cinacalcet (SENSIPAR) 30 MG tablet Take 30 mg by mouth 3 (three) times daily.    Yes Historical Provider, MD  cyanocobalamin 500 MCG tablet Take 500 mcg by mouth daily.   Yes Historical Provider, MD  divalproex (DEPAKOTE) 250 MG DR tablet Take 3 tablets (750 mg total) by mouth every 12 (twelve) hours. 04/22/16  Yes Costin Karlyne Greenspan, MD  ferrous sulfate 325  (65 FE) MG EC tablet Take 325 mg by mouth 2 (two) times daily.    Yes Historical Provider, MD  levETIRAcetam (KEPPRA) 500 MG tablet Take 1 tablet (500 mg total) by mouth 2 (two) times daily. 09/17/15  Yes Charlynne Cousins, MD  medroxyPROGESTERone (DEPO-PROVERA) 150 MG/ML injection Inject 150 mg into the muscle every 3 (three) months.    Yes Historical Provider, MD  potassium chloride (KLOR-CON) 20 MEQ packet Take 20 mEq by mouth daily.    Yes Historical Provider, MD  risperiDONE (RISPERDAL) 0.5 MG tablet Take 0.5 mg by mouth 2 (two) times daily as needed. As needed for agitation   Yes Historical Provider, MD  risperiDONE (RISPERDAL) 3 MG tablet Take 3 mg by mouth at bedtime.   Yes Historical Provider, MD  sevelamer carbonate (RENVELA) 800 MG tablet Take 800 mg by mouth 3 (three) times daily with meals.   Yes Historical Provider, MD  vitamin B-12 (CYANOCOBALAMIN) 500 MCG tablet Take 500 mcg by mouth daily.   Yes Historical Provider, MD  dicyclomine (BENTYL) 20 MG tablet Take 1 tablet (20 mg total) by mouth 2 (two) times daily. 06/21/16   Delsa Grana, PA-C  ondansetron (ZOFRAN ODT) 4 MG disintegrating tablet Take 1 tablet (4 mg total) by mouth every 8 (eight) hours as needed for nausea or vomiting. 06/21/16   Delsa Grana, PA-C    Family History Family History  Problem Relation Age of Onset  . Hypertension Other     Social History Social History  Substance Use Topics  . Smoking status: Never Smoker  . Smokeless tobacco: Never Used  . Alcohol use No     Allergies   Review of patient's allergies indicates no known allergies.   Review of Systems Review of Systems  All other systems reviewed and are negative.    Physical Exam Updated Vital Signs BP 118/74 (BP Location: Right Arm)   Pulse 101   Temp 97.9 F (36.6 C)   Resp 22   SpO2 98%   Physical Exam  Constitutional: She appears well-developed and well-nourished. No distress.  Chronically ill-appearing female, appears older  than stated age, nontoxic in appearance  HENT:  Head: Normocephalic and atraumatic.  Right Ear: External ear normal.  Left Ear: External ear normal.  Nose: Nose normal.  Mouth/Throat: No oropharyngeal exudate.  Mucous membranes dry  Eyes: Conjunctivae and EOM are normal. Pupils are equal, round, and reactive to light. Right eye exhibits no discharge. Left eye exhibits no discharge. No scleral icterus.  Neck: Normal range of motion. Neck supple. No JVD present.  Cardiovascular: Normal rate, regular rhythm, normal heart sounds and intact distal pulses.  Exam reveals no gallop and no friction rub.   No murmur heard. Pulmonary/Chest: Effort normal and breath sounds normal. No respiratory distress. She has no wheezes. She has no rales. She exhibits no  tenderness.  Abdominal: Soft. Bowel sounds are normal. She exhibits no distension and no mass. There is tenderness. There is no rebound and no guarding.  Generalized tenderness to palpation without guarding, no rebound tenderness, no CVA tenderness  Musculoskeletal: Normal range of motion. She exhibits no deformity.  Lymphadenopathy:    She has no cervical adenopathy.  Neurological: She is alert. She displays no tremor. She exhibits normal muscle tone. Coordination normal.  Skin: Skin is warm and dry. No rash noted. She is not diaphoretic. No erythema. No pallor.  Psychiatric: She has a normal mood and affect. Judgment normal. Her speech is delayed. She is slowed. She exhibits abnormal recent memory and abnormal remote memory.  Nursing note and vitals reviewed.    ED Treatments / Results  Labs (all labs ordered are listed, but only abnormal results are displayed) Labs Reviewed  COMPREHENSIVE METABOLIC PANEL - Abnormal; Notable for the following:       Result Value   Sodium 133 (*)    Potassium 5.5 (*)    Chloride 91 (*)    BUN 66 (*)    Creatinine, Ser 11.99 (*)    Albumin 3.2 (*)    GFR calc non Af Amer 3 (*)    GFR calc Af Amer 4 (*)     All other components within normal limits  CBC - Abnormal; Notable for the following:    RBC 3.23 (*)    Hemoglobin 10.7 (*)    HCT 33.6 (*)    MCV 104.0 (*)    Platelets 420 (*)    All other components within normal limits  URINALYSIS, ROUTINE W REFLEX MICROSCOPIC (NOT AT [facility]) - Abnormal; Notable for the following:    APPearance CLOUDY (*)    All other components within normal limits  I-STAT CG4 LACTIC ACID, ED - Abnormal; Notable for the following:    Lactic Acid, Venous 2.60 (*)    All other components within normal limits  LIPASE, BLOOD  I-STAT CG4 LACTIC ACID, ED  I-STAT CG4 LACTIC ACID, ED    EKG  EKG Interpretation  Date/Time:  Tuesday June 20 2016 19:26:15 EDT Ventricular Rate:  101 PR Interval:    QRS Duration: 85 QT Interval:  365 QTC Calculation: 474 R Axis:   22 Text Interpretation:  Sinus tachycardia Low voltage, extremity leads Confirmed by Wilson Singer  MD, STEPHEN ([id number]) on 06/20/2016 8:51:36 PM Also confirmed by Wilson Singer  MD, STEPHEN ([phone]), editor [place], Leda Gauze [phone])  on 06/21/2016 8:17:42 AM       Radiology No results found.  Procedures Procedures (including critical care time)  Medications Ordered in ED Medications  sodium chloride 0.9 % bolus 1,000 mL (0 mLs Intravenous Stopped 06/20/16 2321)  ondansetron (ZOFRAN) injection 4 mg (4 mg Intravenous Given 06/20/16 1921)  morphine 4 MG/ML injection 4 mg (4 mg Intravenous Given 06/20/16 1921)  sodium chloride 0.9 % bolus 500 mL (0 mLs Intravenous Stopped 06/21/16 0130)  dicyclomine (BENTYL) capsule 10 mg (10 mg Oral Given 06/21/16 0055)  ondansetron (ZOFRAN-ODT) disintegrating tablet 4 mg (4 mg Oral Given 06/21/16 0055)     Initial Impression / Assessment and Plan / ED Course  I have reviewed the triage vital signs and the nursing notes.  Pertinent labs & imaging results that were available during my care of the patient were reviewed by me and considered in my medical decision making (see chart for  details).  Clinical Course  50 year old female end-stage renal disease on peritoneal dialysis,  brought to the ER at the direction of PCP for low blood pressure secondary to vomiting and diarrhea for the past 3-4 days.  Patient with history of mental retardation, history is somewhat limited, and most of provided by pt's sister and caregiver.  The pt points to her central abdomen when asked where she feels pain. She has had nausea vomiting and diarrhea over the past 3 days, one episode of emesis in the ER, nonbloody, nonbilious.  She is reportedly fallen at home and felt lightheaded. The patient states that she did fall and bump her head, the family member the bedside who lives with her stated that she did not fall and bump her head.  No reported fevers, chills, sweats.  On exam patient is afebrile, chronically ill appearing, but does not appear acutely ill or toxic, mildly tachycardic with normal blood pressure.  She is orthostatic.  Abdomen is soft and pt states its tender, but does not have any involuntary guarding or rebound tenderness.  Doubt surgical abdomen.  Given appearance of pt and abdominal exam, also doubt SBP.  Blood work is reasuring, no white count. Pt was able to tolerate PO's, able to stand and ambulate w/o presyncopal sx.  Feel she is safe to discharge home with close PCP follow.  Suspect pt has gastroenteritis, likely viral.  She was given fluids and does not appear overloaded, no SOB, no CP.  She will go home and dialyze which will address electrolyte abnormalities.  Pt discharged home with sister, who is comfortable with plan, given zofran and bentyl.  Final Clinical Impressions(s) / ED Diagnoses   Final diagnoses:  Gastroenteritis  Generalized abdominal pain  Orthostatic hypotension    New Prescriptions Discharge Medication List as of 06/21/2016  1:08 AM    START taking these medications   Details  dicyclomine (BENTYL) 20 MG tablet Take 1 tablet (20 mg total) by mouth 2 (two)  times daily., Starting Wed 06/21/2016, Print    ondansetron (ZOFRAN ODT) 4 MG disintegrating tablet Take 1 tablet (4 mg total) by mouth every 8 (eight) hours as needed for nausea or vomiting., Starting Wed 06/21/2016, Print         Delsa Grana, PA-C 06/22/16 1317    Virgel Manifold, MD 07/01/16 [phone]

## 2016-06-20 NOTE — ED Notes (Signed)
Attempted to obtain urine specimen; Pt unable to provide one at this time 

## 2016-06-21 DIAGNOSIS — K529 Noninfective gastroenteritis and colitis, unspecified: Secondary | ICD-10-CM | POA: Diagnosis not present

## 2016-06-21 MED ORDER — DICYCLOMINE HCL 10 MG PO CAPS
10.0000 mg | ORAL_CAPSULE | Freq: Once | ORAL | Status: AC
  Administered 2016-06-21: 10 mg via ORAL
  Filled 2016-06-21: qty 1

## 2016-06-21 MED ORDER — ONDANSETRON 4 MG PO TBDP
4.0000 mg | ORAL_TABLET | Freq: Once | ORAL | Status: AC
  Administered 2016-06-21: 4 mg via ORAL
  Filled 2016-06-21: qty 1

## 2016-06-21 MED ORDER — DICYCLOMINE HCL 20 MG PO TABS: 20.0000 mg | ORAL_TABLET | Freq: Two times a day (BID) | ORAL | 0 refills | Status: DC

## 2016-06-21 MED ORDER — SODIUM CHLORIDE 0.9 % IV BOLUS (SEPSIS)
500.0000 mL | Freq: Once | INTRAVENOUS | Status: AC
  Administered 2016-06-21: 500 mL via INTRAVENOUS

## 2016-06-21 MED ORDER — ONDANSETRON 4 MG PO TBDP: 4.0000 mg | ORAL_TABLET | Freq: Three times a day (TID) | ORAL | 0 refills | Status: DC | PRN

## 2016-06-21 NOTE — ED Notes (Signed)
Pt and sister provided with d/c instructions at this time. Pt's sister verbalizes understanding of d/c instructions as well as follow up procedure.  Pt's sister provided with RX for bentyl and zofran.  Pt's sister verbalizes understanding of RX directions. Pt in no apparent distress at this time. Pt assisted out of the ED via [place] at this time.

## 2016-06-21 NOTE — ED Notes (Signed)
Pt provided with apple juice and a bagged lunch at this time.  Will reassess pt's tolerance of PO intake.

## 2016-06-21 NOTE — ED Notes (Signed)
Pt provided with 4 oz of apple juice at this time.

## 2016-06-21 NOTE — ED Notes (Signed)
Pt and sister report that pt ate all of the bagged lunch and drank all of the juice without vomiting. Pt's sister states that pt appeared to "want" to vomit but did not.

## 2016-06-22 DIAGNOSIS — N186 End stage renal disease: Secondary | ICD-10-CM | POA: Diagnosis not present

## 2016-06-22 DIAGNOSIS — Z992 Dependence on renal dialysis: Secondary | ICD-10-CM | POA: Diagnosis not present

## 2016-06-23 DIAGNOSIS — N2581 Secondary hyperparathyroidism of renal origin: Secondary | ICD-10-CM | POA: Diagnosis not present

## 2016-06-23 DIAGNOSIS — D631 Anemia in chronic kidney disease: Secondary | ICD-10-CM | POA: Diagnosis not present

## 2016-06-23 DIAGNOSIS — N186 End stage renal disease: Secondary | ICD-10-CM | POA: Diagnosis not present

## 2016-06-24 DIAGNOSIS — N2581 Secondary hyperparathyroidism of renal origin: Secondary | ICD-10-CM | POA: Diagnosis not present

## 2016-06-24 DIAGNOSIS — N186 End stage renal disease: Secondary | ICD-10-CM | POA: Diagnosis not present

## 2016-06-24 DIAGNOSIS — D631 Anemia in chronic kidney disease: Secondary | ICD-10-CM | POA: Diagnosis not present

## 2016-06-25 DIAGNOSIS — N2581 Secondary hyperparathyroidism of renal origin: Secondary | ICD-10-CM | POA: Diagnosis not present

## 2016-06-25 DIAGNOSIS — N186 End stage renal disease: Secondary | ICD-10-CM | POA: Diagnosis not present

## 2016-06-25 DIAGNOSIS — D631 Anemia in chronic kidney disease: Secondary | ICD-10-CM | POA: Diagnosis not present

## 2016-06-26 DIAGNOSIS — D631 Anemia in chronic kidney disease: Secondary | ICD-10-CM | POA: Diagnosis not present

## 2016-06-26 DIAGNOSIS — N186 End stage renal disease: Secondary | ICD-10-CM | POA: Diagnosis not present

## 2016-06-26 DIAGNOSIS — N2581 Secondary hyperparathyroidism of renal origin: Secondary | ICD-10-CM | POA: Diagnosis not present

## 2016-06-27 DIAGNOSIS — N2581 Secondary hyperparathyroidism of renal origin: Secondary | ICD-10-CM | POA: Diagnosis not present

## 2016-06-27 DIAGNOSIS — N186 End stage renal disease: Secondary | ICD-10-CM | POA: Diagnosis not present

## 2016-06-27 DIAGNOSIS — D631 Anemia in chronic kidney disease: Secondary | ICD-10-CM | POA: Diagnosis not present

## 2016-06-28 DIAGNOSIS — D631 Anemia in chronic kidney disease: Secondary | ICD-10-CM | POA: Diagnosis not present

## 2016-06-28 DIAGNOSIS — N186 End stage renal disease: Secondary | ICD-10-CM | POA: Diagnosis not present

## 2016-06-28 DIAGNOSIS — N2581 Secondary hyperparathyroidism of renal origin: Secondary | ICD-10-CM | POA: Diagnosis not present

## 2016-06-29 DIAGNOSIS — D631 Anemia in chronic kidney disease: Secondary | ICD-10-CM | POA: Diagnosis not present

## 2016-06-29 DIAGNOSIS — N186 End stage renal disease: Secondary | ICD-10-CM | POA: Diagnosis not present

## 2016-06-29 DIAGNOSIS — N2581 Secondary hyperparathyroidism of renal origin: Secondary | ICD-10-CM | POA: Diagnosis not present

## 2016-06-30 DIAGNOSIS — N186 End stage renal disease: Secondary | ICD-10-CM | POA: Diagnosis not present

## 2016-06-30 DIAGNOSIS — N2581 Secondary hyperparathyroidism of renal origin: Secondary | ICD-10-CM | POA: Diagnosis not present

## 2016-06-30 DIAGNOSIS — D631 Anemia in chronic kidney disease: Secondary | ICD-10-CM | POA: Diagnosis not present

## 2016-07-01 DIAGNOSIS — N2581 Secondary hyperparathyroidism of renal origin: Secondary | ICD-10-CM | POA: Diagnosis not present

## 2016-07-01 DIAGNOSIS — N186 End stage renal disease: Secondary | ICD-10-CM | POA: Diagnosis not present

## 2016-07-01 DIAGNOSIS — D631 Anemia in chronic kidney disease: Secondary | ICD-10-CM | POA: Diagnosis not present

## 2016-07-02 DIAGNOSIS — N186 End stage renal disease: Secondary | ICD-10-CM | POA: Diagnosis not present

## 2016-07-02 DIAGNOSIS — N2581 Secondary hyperparathyroidism of renal origin: Secondary | ICD-10-CM | POA: Diagnosis not present

## 2016-07-02 DIAGNOSIS — D631 Anemia in chronic kidney disease: Secondary | ICD-10-CM | POA: Diagnosis not present

## 2016-07-03 DIAGNOSIS — N2581 Secondary hyperparathyroidism of renal origin: Secondary | ICD-10-CM | POA: Diagnosis not present

## 2016-07-03 DIAGNOSIS — N186 End stage renal disease: Secondary | ICD-10-CM | POA: Diagnosis not present

## 2016-07-03 DIAGNOSIS — D631 Anemia in chronic kidney disease: Secondary | ICD-10-CM | POA: Diagnosis not present

## 2016-07-04 DIAGNOSIS — N186 End stage renal disease: Secondary | ICD-10-CM | POA: Diagnosis not present

## 2016-07-04 DIAGNOSIS — D631 Anemia in chronic kidney disease: Secondary | ICD-10-CM | POA: Diagnosis not present

## 2016-07-04 DIAGNOSIS — N2581 Secondary hyperparathyroidism of renal origin: Secondary | ICD-10-CM | POA: Diagnosis not present

## 2016-07-05 DIAGNOSIS — N186 End stage renal disease: Secondary | ICD-10-CM | POA: Diagnosis not present

## 2016-07-05 DIAGNOSIS — N2581 Secondary hyperparathyroidism of renal origin: Secondary | ICD-10-CM | POA: Diagnosis not present

## 2016-07-05 DIAGNOSIS — E785 Hyperlipidemia, unspecified: Secondary | ICD-10-CM | POA: Diagnosis not present

## 2016-07-05 DIAGNOSIS — I119 Hypertensive heart disease without heart failure: Secondary | ICD-10-CM | POA: Diagnosis not present

## 2016-07-05 DIAGNOSIS — D631 Anemia in chronic kidney disease: Secondary | ICD-10-CM | POA: Diagnosis not present

## 2016-07-05 DIAGNOSIS — E039 Hypothyroidism, unspecified: Secondary | ICD-10-CM | POA: Diagnosis not present

## 2016-07-05 DIAGNOSIS — D539 Nutritional anemia, unspecified: Secondary | ICD-10-CM | POA: Diagnosis not present

## 2016-07-05 DIAGNOSIS — R569 Unspecified convulsions: Secondary | ICD-10-CM | POA: Diagnosis not present

## 2016-07-05 DIAGNOSIS — I10 Essential (primary) hypertension: Secondary | ICD-10-CM | POA: Diagnosis not present

## 2016-07-06 DIAGNOSIS — N2581 Secondary hyperparathyroidism of renal origin: Secondary | ICD-10-CM | POA: Diagnosis not present

## 2016-07-06 DIAGNOSIS — D631 Anemia in chronic kidney disease: Secondary | ICD-10-CM | POA: Diagnosis not present

## 2016-07-06 DIAGNOSIS — N186 End stage renal disease: Secondary | ICD-10-CM | POA: Diagnosis not present

## 2016-07-07 DIAGNOSIS — N186 End stage renal disease: Secondary | ICD-10-CM | POA: Diagnosis not present

## 2016-07-07 DIAGNOSIS — D631 Anemia in chronic kidney disease: Secondary | ICD-10-CM | POA: Diagnosis not present

## 2016-07-07 DIAGNOSIS — N2581 Secondary hyperparathyroidism of renal origin: Secondary | ICD-10-CM | POA: Diagnosis not present

## 2016-07-08 DIAGNOSIS — N2581 Secondary hyperparathyroidism of renal origin: Secondary | ICD-10-CM | POA: Diagnosis not present

## 2016-07-08 DIAGNOSIS — D631 Anemia in chronic kidney disease: Secondary | ICD-10-CM | POA: Diagnosis not present

## 2016-07-08 DIAGNOSIS — N186 End stage renal disease: Secondary | ICD-10-CM | POA: Diagnosis not present

## 2016-07-09 DIAGNOSIS — D631 Anemia in chronic kidney disease: Secondary | ICD-10-CM | POA: Diagnosis not present

## 2016-07-09 DIAGNOSIS — N186 End stage renal disease: Secondary | ICD-10-CM | POA: Diagnosis not present

## 2016-07-09 DIAGNOSIS — N2581 Secondary hyperparathyroidism of renal origin: Secondary | ICD-10-CM | POA: Diagnosis not present

## 2016-07-10 DIAGNOSIS — N186 End stage renal disease: Secondary | ICD-10-CM | POA: Diagnosis not present

## 2016-07-10 DIAGNOSIS — D631 Anemia in chronic kidney disease: Secondary | ICD-10-CM | POA: Diagnosis not present

## 2016-07-10 DIAGNOSIS — N2581 Secondary hyperparathyroidism of renal origin: Secondary | ICD-10-CM | POA: Diagnosis not present

## 2016-07-11 DIAGNOSIS — D631 Anemia in chronic kidney disease: Secondary | ICD-10-CM | POA: Diagnosis not present

## 2016-07-11 DIAGNOSIS — N2581 Secondary hyperparathyroidism of renal origin: Secondary | ICD-10-CM | POA: Diagnosis not present

## 2016-07-11 DIAGNOSIS — N186 End stage renal disease: Secondary | ICD-10-CM | POA: Diagnosis not present

## 2016-07-12 DIAGNOSIS — D631 Anemia in chronic kidney disease: Secondary | ICD-10-CM | POA: Diagnosis not present

## 2016-07-12 DIAGNOSIS — N186 End stage renal disease: Secondary | ICD-10-CM | POA: Diagnosis not present

## 2016-07-12 DIAGNOSIS — N2581 Secondary hyperparathyroidism of renal origin: Secondary | ICD-10-CM | POA: Diagnosis not present

## 2016-07-13 DIAGNOSIS — N2581 Secondary hyperparathyroidism of renal origin: Secondary | ICD-10-CM | POA: Diagnosis not present

## 2016-07-13 DIAGNOSIS — D631 Anemia in chronic kidney disease: Secondary | ICD-10-CM | POA: Diagnosis not present

## 2016-07-13 DIAGNOSIS — N186 End stage renal disease: Secondary | ICD-10-CM | POA: Diagnosis not present

## 2016-07-14 DIAGNOSIS — N2581 Secondary hyperparathyroidism of renal origin: Secondary | ICD-10-CM | POA: Diagnosis not present

## 2016-07-14 DIAGNOSIS — N186 End stage renal disease: Secondary | ICD-10-CM | POA: Diagnosis not present

## 2016-07-14 DIAGNOSIS — D631 Anemia in chronic kidney disease: Secondary | ICD-10-CM | POA: Diagnosis not present

## 2016-07-15 DIAGNOSIS — N186 End stage renal disease: Secondary | ICD-10-CM | POA: Diagnosis not present

## 2016-07-15 DIAGNOSIS — D631 Anemia in chronic kidney disease: Secondary | ICD-10-CM | POA: Diagnosis not present

## 2016-07-15 DIAGNOSIS — N2581 Secondary hyperparathyroidism of renal origin: Secondary | ICD-10-CM | POA: Diagnosis not present

## 2016-07-16 DIAGNOSIS — D631 Anemia in chronic kidney disease: Secondary | ICD-10-CM | POA: Diagnosis not present

## 2016-07-16 DIAGNOSIS — N2581 Secondary hyperparathyroidism of renal origin: Secondary | ICD-10-CM | POA: Diagnosis not present

## 2016-07-16 DIAGNOSIS — N186 End stage renal disease: Secondary | ICD-10-CM | POA: Diagnosis not present

## 2016-07-17 DIAGNOSIS — D631 Anemia in chronic kidney disease: Secondary | ICD-10-CM | POA: Diagnosis not present

## 2016-07-17 DIAGNOSIS — N2581 Secondary hyperparathyroidism of renal origin: Secondary | ICD-10-CM | POA: Diagnosis not present

## 2016-07-17 DIAGNOSIS — N186 End stage renal disease: Secondary | ICD-10-CM | POA: Diagnosis not present

## 2016-07-18 DIAGNOSIS — N186 End stage renal disease: Secondary | ICD-10-CM | POA: Diagnosis not present

## 2016-07-18 DIAGNOSIS — D631 Anemia in chronic kidney disease: Secondary | ICD-10-CM | POA: Diagnosis not present

## 2016-07-18 DIAGNOSIS — N2581 Secondary hyperparathyroidism of renal origin: Secondary | ICD-10-CM | POA: Diagnosis not present

## 2016-07-19 DIAGNOSIS — D631 Anemia in chronic kidney disease: Secondary | ICD-10-CM | POA: Diagnosis not present

## 2016-07-19 DIAGNOSIS — N2581 Secondary hyperparathyroidism of renal origin: Secondary | ICD-10-CM | POA: Diagnosis not present

## 2016-07-19 DIAGNOSIS — N186 End stage renal disease: Secondary | ICD-10-CM | POA: Diagnosis not present

## 2016-07-20 DIAGNOSIS — N186 End stage renal disease: Secondary | ICD-10-CM | POA: Diagnosis not present

## 2016-07-20 DIAGNOSIS — N2581 Secondary hyperparathyroidism of renal origin: Secondary | ICD-10-CM | POA: Diagnosis not present

## 2016-07-20 DIAGNOSIS — D631 Anemia in chronic kidney disease: Secondary | ICD-10-CM | POA: Diagnosis not present

## 2016-07-21 DIAGNOSIS — D631 Anemia in chronic kidney disease: Secondary | ICD-10-CM | POA: Diagnosis not present

## 2016-07-21 DIAGNOSIS — N186 End stage renal disease: Secondary | ICD-10-CM | POA: Diagnosis not present

## 2016-07-21 DIAGNOSIS — N2581 Secondary hyperparathyroidism of renal origin: Secondary | ICD-10-CM | POA: Diagnosis not present

## 2016-07-22 DIAGNOSIS — Z992 Dependence on renal dialysis: Secondary | ICD-10-CM | POA: Diagnosis not present

## 2016-07-22 DIAGNOSIS — N186 End stage renal disease: Secondary | ICD-10-CM | POA: Diagnosis not present

## 2016-07-22 DIAGNOSIS — D631 Anemia in chronic kidney disease: Secondary | ICD-10-CM | POA: Diagnosis not present

## 2016-07-22 DIAGNOSIS — N2581 Secondary hyperparathyroidism of renal origin: Secondary | ICD-10-CM | POA: Diagnosis not present

## 2016-07-23 DIAGNOSIS — Z23 Encounter for immunization: Secondary | ICD-10-CM | POA: Diagnosis not present

## 2016-07-23 DIAGNOSIS — N186 End stage renal disease: Secondary | ICD-10-CM | POA: Diagnosis not present

## 2016-07-23 DIAGNOSIS — N2581 Secondary hyperparathyroidism of renal origin: Secondary | ICD-10-CM | POA: Diagnosis not present

## 2016-07-23 DIAGNOSIS — D631 Anemia in chronic kidney disease: Secondary | ICD-10-CM | POA: Diagnosis not present

## 2016-07-24 DIAGNOSIS — D631 Anemia in chronic kidney disease: Secondary | ICD-10-CM | POA: Diagnosis not present

## 2016-07-24 DIAGNOSIS — N2581 Secondary hyperparathyroidism of renal origin: Secondary | ICD-10-CM | POA: Diagnosis not present

## 2016-07-24 DIAGNOSIS — Z23 Encounter for immunization: Secondary | ICD-10-CM | POA: Diagnosis not present

## 2016-07-24 DIAGNOSIS — N186 End stage renal disease: Secondary | ICD-10-CM | POA: Diagnosis not present

## 2016-07-25 DIAGNOSIS — N186 End stage renal disease: Secondary | ICD-10-CM | POA: Diagnosis not present

## 2016-07-25 DIAGNOSIS — D631 Anemia in chronic kidney disease: Secondary | ICD-10-CM | POA: Diagnosis not present

## 2016-07-25 DIAGNOSIS — N2581 Secondary hyperparathyroidism of renal origin: Secondary | ICD-10-CM | POA: Diagnosis not present

## 2016-07-25 DIAGNOSIS — Z23 Encounter for immunization: Secondary | ICD-10-CM | POA: Diagnosis not present

## 2016-07-26 DIAGNOSIS — Z23 Encounter for immunization: Secondary | ICD-10-CM | POA: Diagnosis not present

## 2016-07-26 DIAGNOSIS — D631 Anemia in chronic kidney disease: Secondary | ICD-10-CM | POA: Diagnosis not present

## 2016-07-26 DIAGNOSIS — N186 End stage renal disease: Secondary | ICD-10-CM | POA: Diagnosis not present

## 2016-07-26 DIAGNOSIS — N2581 Secondary hyperparathyroidism of renal origin: Secondary | ICD-10-CM | POA: Diagnosis not present

## 2016-07-27 DIAGNOSIS — N186 End stage renal disease: Secondary | ICD-10-CM | POA: Diagnosis not present

## 2016-07-27 DIAGNOSIS — N2581 Secondary hyperparathyroidism of renal origin: Secondary | ICD-10-CM | POA: Diagnosis not present

## 2016-07-27 DIAGNOSIS — Z23 Encounter for immunization: Secondary | ICD-10-CM | POA: Diagnosis not present

## 2016-07-27 DIAGNOSIS — D631 Anemia in chronic kidney disease: Secondary | ICD-10-CM | POA: Diagnosis not present

## 2016-07-28 DIAGNOSIS — N186 End stage renal disease: Secondary | ICD-10-CM | POA: Diagnosis not present

## 2016-07-28 DIAGNOSIS — N2581 Secondary hyperparathyroidism of renal origin: Secondary | ICD-10-CM | POA: Diagnosis not present

## 2016-07-28 DIAGNOSIS — Z23 Encounter for immunization: Secondary | ICD-10-CM | POA: Diagnosis not present

## 2016-07-28 DIAGNOSIS — D631 Anemia in chronic kidney disease: Secondary | ICD-10-CM | POA: Diagnosis not present

## 2016-07-29 DIAGNOSIS — N186 End stage renal disease: Secondary | ICD-10-CM | POA: Diagnosis not present

## 2016-07-29 DIAGNOSIS — D631 Anemia in chronic kidney disease: Secondary | ICD-10-CM | POA: Diagnosis not present

## 2016-07-29 DIAGNOSIS — N2581 Secondary hyperparathyroidism of renal origin: Secondary | ICD-10-CM | POA: Diagnosis not present

## 2016-07-29 DIAGNOSIS — Z23 Encounter for immunization: Secondary | ICD-10-CM | POA: Diagnosis not present

## 2016-07-30 DIAGNOSIS — N2581 Secondary hyperparathyroidism of renal origin: Secondary | ICD-10-CM | POA: Diagnosis not present

## 2016-07-30 DIAGNOSIS — N186 End stage renal disease: Secondary | ICD-10-CM | POA: Diagnosis not present

## 2016-07-30 DIAGNOSIS — Z23 Encounter for immunization: Secondary | ICD-10-CM | POA: Diagnosis not present

## 2016-07-30 DIAGNOSIS — D631 Anemia in chronic kidney disease: Secondary | ICD-10-CM | POA: Diagnosis not present

## 2016-07-31 DIAGNOSIS — N186 End stage renal disease: Secondary | ICD-10-CM | POA: Diagnosis not present

## 2016-07-31 DIAGNOSIS — Z23 Encounter for immunization: Secondary | ICD-10-CM | POA: Diagnosis not present

## 2016-07-31 DIAGNOSIS — N2581 Secondary hyperparathyroidism of renal origin: Secondary | ICD-10-CM | POA: Diagnosis not present

## 2016-07-31 DIAGNOSIS — D631 Anemia in chronic kidney disease: Secondary | ICD-10-CM | POA: Diagnosis not present

## 2016-08-01 DIAGNOSIS — N186 End stage renal disease: Secondary | ICD-10-CM | POA: Diagnosis not present

## 2016-08-01 DIAGNOSIS — Z23 Encounter for immunization: Secondary | ICD-10-CM | POA: Diagnosis not present

## 2016-08-01 DIAGNOSIS — D631 Anemia in chronic kidney disease: Secondary | ICD-10-CM | POA: Diagnosis not present

## 2016-08-01 DIAGNOSIS — N2581 Secondary hyperparathyroidism of renal origin: Secondary | ICD-10-CM | POA: Diagnosis not present

## 2016-08-02 DIAGNOSIS — Z23 Encounter for immunization: Secondary | ICD-10-CM | POA: Diagnosis not present

## 2016-08-02 DIAGNOSIS — D631 Anemia in chronic kidney disease: Secondary | ICD-10-CM | POA: Diagnosis not present

## 2016-08-02 DIAGNOSIS — N2581 Secondary hyperparathyroidism of renal origin: Secondary | ICD-10-CM | POA: Diagnosis not present

## 2016-08-02 DIAGNOSIS — N186 End stage renal disease: Secondary | ICD-10-CM | POA: Diagnosis not present

## 2016-08-03 DIAGNOSIS — D631 Anemia in chronic kidney disease: Secondary | ICD-10-CM | POA: Diagnosis not present

## 2016-08-03 DIAGNOSIS — N2581 Secondary hyperparathyroidism of renal origin: Secondary | ICD-10-CM | POA: Diagnosis not present

## 2016-08-03 DIAGNOSIS — N186 End stage renal disease: Secondary | ICD-10-CM | POA: Diagnosis not present

## 2016-08-03 DIAGNOSIS — Z23 Encounter for immunization: Secondary | ICD-10-CM | POA: Diagnosis not present

## 2016-08-04 DIAGNOSIS — N2581 Secondary hyperparathyroidism of renal origin: Secondary | ICD-10-CM | POA: Diagnosis not present

## 2016-08-04 DIAGNOSIS — D631 Anemia in chronic kidney disease: Secondary | ICD-10-CM | POA: Diagnosis not present

## 2016-08-04 DIAGNOSIS — N186 End stage renal disease: Secondary | ICD-10-CM | POA: Diagnosis not present

## 2016-08-04 DIAGNOSIS — Z23 Encounter for immunization: Secondary | ICD-10-CM | POA: Diagnosis not present

## 2016-08-05 DIAGNOSIS — D631 Anemia in chronic kidney disease: Secondary | ICD-10-CM | POA: Diagnosis not present

## 2016-08-05 DIAGNOSIS — N2581 Secondary hyperparathyroidism of renal origin: Secondary | ICD-10-CM | POA: Diagnosis not present

## 2016-08-05 DIAGNOSIS — N186 End stage renal disease: Secondary | ICD-10-CM | POA: Diagnosis not present

## 2016-08-05 DIAGNOSIS — Z23 Encounter for immunization: Secondary | ICD-10-CM | POA: Diagnosis not present

## 2016-08-06 DIAGNOSIS — D631 Anemia in chronic kidney disease: Secondary | ICD-10-CM | POA: Diagnosis not present

## 2016-08-06 DIAGNOSIS — N186 End stage renal disease: Secondary | ICD-10-CM | POA: Diagnosis not present

## 2016-08-06 DIAGNOSIS — Z23 Encounter for immunization: Secondary | ICD-10-CM | POA: Diagnosis not present

## 2016-08-06 DIAGNOSIS — N2581 Secondary hyperparathyroidism of renal origin: Secondary | ICD-10-CM | POA: Diagnosis not present

## 2016-08-07 DIAGNOSIS — N186 End stage renal disease: Secondary | ICD-10-CM | POA: Diagnosis not present

## 2016-08-07 DIAGNOSIS — D631 Anemia in chronic kidney disease: Secondary | ICD-10-CM | POA: Diagnosis not present

## 2016-08-07 DIAGNOSIS — N2581 Secondary hyperparathyroidism of renal origin: Secondary | ICD-10-CM | POA: Diagnosis not present

## 2016-08-07 DIAGNOSIS — Z23 Encounter for immunization: Secondary | ICD-10-CM | POA: Diagnosis not present

## 2016-08-08 DIAGNOSIS — D631 Anemia in chronic kidney disease: Secondary | ICD-10-CM | POA: Diagnosis not present

## 2016-08-08 DIAGNOSIS — Z23 Encounter for immunization: Secondary | ICD-10-CM | POA: Diagnosis not present

## 2016-08-08 DIAGNOSIS — N186 End stage renal disease: Secondary | ICD-10-CM | POA: Diagnosis not present

## 2016-08-08 DIAGNOSIS — N2581 Secondary hyperparathyroidism of renal origin: Secondary | ICD-10-CM | POA: Diagnosis not present

## 2016-08-09 DIAGNOSIS — Z23 Encounter for immunization: Secondary | ICD-10-CM | POA: Diagnosis not present

## 2016-08-09 DIAGNOSIS — N2581 Secondary hyperparathyroidism of renal origin: Secondary | ICD-10-CM | POA: Diagnosis not present

## 2016-08-09 DIAGNOSIS — D631 Anemia in chronic kidney disease: Secondary | ICD-10-CM | POA: Diagnosis not present

## 2016-08-09 DIAGNOSIS — N186 End stage renal disease: Secondary | ICD-10-CM | POA: Diagnosis not present

## 2016-08-10 DIAGNOSIS — D631 Anemia in chronic kidney disease: Secondary | ICD-10-CM | POA: Diagnosis not present

## 2016-08-10 DIAGNOSIS — Z23 Encounter for immunization: Secondary | ICD-10-CM | POA: Diagnosis not present

## 2016-08-10 DIAGNOSIS — N186 End stage renal disease: Secondary | ICD-10-CM | POA: Diagnosis not present

## 2016-08-10 DIAGNOSIS — N2581 Secondary hyperparathyroidism of renal origin: Secondary | ICD-10-CM | POA: Diagnosis not present

## 2016-08-11 DIAGNOSIS — D631 Anemia in chronic kidney disease: Secondary | ICD-10-CM | POA: Diagnosis not present

## 2016-08-11 DIAGNOSIS — Z23 Encounter for immunization: Secondary | ICD-10-CM | POA: Diagnosis not present

## 2016-08-11 DIAGNOSIS — N2581 Secondary hyperparathyroidism of renal origin: Secondary | ICD-10-CM | POA: Diagnosis not present

## 2016-08-11 DIAGNOSIS — N186 End stage renal disease: Secondary | ICD-10-CM | POA: Diagnosis not present

## 2016-08-12 DIAGNOSIS — D631 Anemia in chronic kidney disease: Secondary | ICD-10-CM | POA: Diagnosis not present

## 2016-08-12 DIAGNOSIS — N186 End stage renal disease: Secondary | ICD-10-CM | POA: Diagnosis not present

## 2016-08-12 DIAGNOSIS — N2581 Secondary hyperparathyroidism of renal origin: Secondary | ICD-10-CM | POA: Diagnosis not present

## 2016-08-12 DIAGNOSIS — Z23 Encounter for immunization: Secondary | ICD-10-CM | POA: Diagnosis not present

## 2016-08-13 DIAGNOSIS — D631 Anemia in chronic kidney disease: Secondary | ICD-10-CM | POA: Diagnosis not present

## 2016-08-13 DIAGNOSIS — N2581 Secondary hyperparathyroidism of renal origin: Secondary | ICD-10-CM | POA: Diagnosis not present

## 2016-08-13 DIAGNOSIS — N186 End stage renal disease: Secondary | ICD-10-CM | POA: Diagnosis not present

## 2016-08-13 DIAGNOSIS — Z23 Encounter for immunization: Secondary | ICD-10-CM | POA: Diagnosis not present

## 2016-08-14 DIAGNOSIS — D631 Anemia in chronic kidney disease: Secondary | ICD-10-CM | POA: Diagnosis not present

## 2016-08-14 DIAGNOSIS — N186 End stage renal disease: Secondary | ICD-10-CM | POA: Diagnosis not present

## 2016-08-14 DIAGNOSIS — N2581 Secondary hyperparathyroidism of renal origin: Secondary | ICD-10-CM | POA: Diagnosis not present

## 2016-08-14 DIAGNOSIS — Z23 Encounter for immunization: Secondary | ICD-10-CM | POA: Diagnosis not present

## 2016-08-15 DIAGNOSIS — D631 Anemia in chronic kidney disease: Secondary | ICD-10-CM | POA: Diagnosis not present

## 2016-08-15 DIAGNOSIS — N2581 Secondary hyperparathyroidism of renal origin: Secondary | ICD-10-CM | POA: Diagnosis not present

## 2016-08-15 DIAGNOSIS — N186 End stage renal disease: Secondary | ICD-10-CM | POA: Diagnosis not present

## 2016-08-15 DIAGNOSIS — Z23 Encounter for immunization: Secondary | ICD-10-CM | POA: Diagnosis not present

## 2016-08-16 DIAGNOSIS — N186 End stage renal disease: Secondary | ICD-10-CM | POA: Diagnosis not present

## 2016-08-16 DIAGNOSIS — D631 Anemia in chronic kidney disease: Secondary | ICD-10-CM | POA: Diagnosis not present

## 2016-08-16 DIAGNOSIS — Z23 Encounter for immunization: Secondary | ICD-10-CM | POA: Diagnosis not present

## 2016-08-16 DIAGNOSIS — N2581 Secondary hyperparathyroidism of renal origin: Secondary | ICD-10-CM | POA: Diagnosis not present

## 2016-08-17 DIAGNOSIS — N186 End stage renal disease: Secondary | ICD-10-CM | POA: Diagnosis not present

## 2016-08-17 DIAGNOSIS — N2581 Secondary hyperparathyroidism of renal origin: Secondary | ICD-10-CM | POA: Diagnosis not present

## 2016-08-17 DIAGNOSIS — Z23 Encounter for immunization: Secondary | ICD-10-CM | POA: Diagnosis not present

## 2016-08-17 DIAGNOSIS — D631 Anemia in chronic kidney disease: Secondary | ICD-10-CM | POA: Diagnosis not present

## 2016-08-18 DIAGNOSIS — N186 End stage renal disease: Secondary | ICD-10-CM | POA: Diagnosis not present

## 2016-08-18 DIAGNOSIS — E785 Hyperlipidemia, unspecified: Secondary | ICD-10-CM | POA: Diagnosis not present

## 2016-08-18 DIAGNOSIS — N2581 Secondary hyperparathyroidism of renal origin: Secondary | ICD-10-CM | POA: Diagnosis not present

## 2016-08-18 DIAGNOSIS — E039 Hypothyroidism, unspecified: Secondary | ICD-10-CM | POA: Diagnosis not present

## 2016-08-18 DIAGNOSIS — I10 Essential (primary) hypertension: Secondary | ICD-10-CM | POA: Diagnosis not present

## 2016-08-18 DIAGNOSIS — I119 Hypertensive heart disease without heart failure: Secondary | ICD-10-CM | POA: Diagnosis not present

## 2016-08-18 DIAGNOSIS — D539 Nutritional anemia, unspecified: Secondary | ICD-10-CM | POA: Diagnosis not present

## 2016-08-18 DIAGNOSIS — Z23 Encounter for immunization: Secondary | ICD-10-CM | POA: Diagnosis not present

## 2016-08-18 DIAGNOSIS — D631 Anemia in chronic kidney disease: Secondary | ICD-10-CM | POA: Diagnosis not present

## 2016-08-18 DIAGNOSIS — R569 Unspecified convulsions: Secondary | ICD-10-CM | POA: Diagnosis not present

## 2016-08-19 DIAGNOSIS — D631 Anemia in chronic kidney disease: Secondary | ICD-10-CM | POA: Diagnosis not present

## 2016-08-19 DIAGNOSIS — N186 End stage renal disease: Secondary | ICD-10-CM | POA: Diagnosis not present

## 2016-08-19 DIAGNOSIS — N2581 Secondary hyperparathyroidism of renal origin: Secondary | ICD-10-CM | POA: Diagnosis not present

## 2016-08-19 DIAGNOSIS — Z23 Encounter for immunization: Secondary | ICD-10-CM | POA: Diagnosis not present

## 2016-08-20 DIAGNOSIS — N186 End stage renal disease: Secondary | ICD-10-CM | POA: Diagnosis not present

## 2016-08-20 DIAGNOSIS — Z23 Encounter for immunization: Secondary | ICD-10-CM | POA: Diagnosis not present

## 2016-08-20 DIAGNOSIS — N2581 Secondary hyperparathyroidism of renal origin: Secondary | ICD-10-CM | POA: Diagnosis not present

## 2016-08-20 DIAGNOSIS — D631 Anemia in chronic kidney disease: Secondary | ICD-10-CM | POA: Diagnosis not present

## 2016-08-21 DIAGNOSIS — N2581 Secondary hyperparathyroidism of renal origin: Secondary | ICD-10-CM | POA: Diagnosis not present

## 2016-08-21 DIAGNOSIS — Z23 Encounter for immunization: Secondary | ICD-10-CM | POA: Diagnosis not present

## 2016-08-21 DIAGNOSIS — D631 Anemia in chronic kidney disease: Secondary | ICD-10-CM | POA: Diagnosis not present

## 2016-08-21 DIAGNOSIS — N186 End stage renal disease: Secondary | ICD-10-CM | POA: Diagnosis not present

## 2016-08-22 DIAGNOSIS — Z992 Dependence on renal dialysis: Secondary | ICD-10-CM | POA: Diagnosis not present

## 2016-08-22 DIAGNOSIS — Z23 Encounter for immunization: Secondary | ICD-10-CM | POA: Diagnosis not present

## 2016-08-22 DIAGNOSIS — N2581 Secondary hyperparathyroidism of renal origin: Secondary | ICD-10-CM | POA: Diagnosis not present

## 2016-08-22 DIAGNOSIS — N186 End stage renal disease: Secondary | ICD-10-CM | POA: Diagnosis not present

## 2016-08-22 DIAGNOSIS — D631 Anemia in chronic kidney disease: Secondary | ICD-10-CM | POA: Diagnosis not present

## 2016-08-23 DIAGNOSIS — D631 Anemia in chronic kidney disease: Secondary | ICD-10-CM | POA: Diagnosis not present

## 2016-08-23 DIAGNOSIS — N2581 Secondary hyperparathyroidism of renal origin: Secondary | ICD-10-CM | POA: Diagnosis not present

## 2016-08-23 DIAGNOSIS — N186 End stage renal disease: Secondary | ICD-10-CM | POA: Diagnosis not present

## 2016-08-24 DIAGNOSIS — E538 Deficiency of other specified B group vitamins: Secondary | ICD-10-CM | POA: Diagnosis not present

## 2016-09-21 DIAGNOSIS — Z992 Dependence on renal dialysis: Secondary | ICD-10-CM | POA: Diagnosis not present

## 2016-09-21 DIAGNOSIS — N186 End stage renal disease: Secondary | ICD-10-CM | POA: Diagnosis not present

## 2016-09-22 DIAGNOSIS — D631 Anemia in chronic kidney disease: Secondary | ICD-10-CM | POA: Diagnosis not present

## 2016-09-22 DIAGNOSIS — N186 End stage renal disease: Secondary | ICD-10-CM | POA: Diagnosis not present

## 2016-09-22 DIAGNOSIS — N2581 Secondary hyperparathyroidism of renal origin: Secondary | ICD-10-CM | POA: Diagnosis not present

## 2016-09-23 DIAGNOSIS — N186 End stage renal disease: Secondary | ICD-10-CM | POA: Diagnosis not present

## 2016-09-23 DIAGNOSIS — N2581 Secondary hyperparathyroidism of renal origin: Secondary | ICD-10-CM | POA: Diagnosis not present

## 2016-09-23 DIAGNOSIS — D631 Anemia in chronic kidney disease: Secondary | ICD-10-CM | POA: Diagnosis not present

## 2016-09-24 DIAGNOSIS — N2581 Secondary hyperparathyroidism of renal origin: Secondary | ICD-10-CM | POA: Diagnosis not present

## 2016-09-24 DIAGNOSIS — N186 End stage renal disease: Secondary | ICD-10-CM | POA: Diagnosis not present

## 2016-09-24 DIAGNOSIS — D631 Anemia in chronic kidney disease: Secondary | ICD-10-CM | POA: Diagnosis not present

## 2016-09-25 DIAGNOSIS — N186 End stage renal disease: Secondary | ICD-10-CM | POA: Diagnosis not present

## 2016-09-25 DIAGNOSIS — D631 Anemia in chronic kidney disease: Secondary | ICD-10-CM | POA: Diagnosis not present

## 2016-09-25 DIAGNOSIS — N2581 Secondary hyperparathyroidism of renal origin: Secondary | ICD-10-CM | POA: Diagnosis not present

## 2016-09-26 DIAGNOSIS — N2581 Secondary hyperparathyroidism of renal origin: Secondary | ICD-10-CM | POA: Diagnosis not present

## 2016-09-26 DIAGNOSIS — N186 End stage renal disease: Secondary | ICD-10-CM | POA: Diagnosis not present

## 2016-09-26 DIAGNOSIS — D631 Anemia in chronic kidney disease: Secondary | ICD-10-CM | POA: Diagnosis not present

## 2016-09-27 DIAGNOSIS — N186 End stage renal disease: Secondary | ICD-10-CM | POA: Diagnosis not present

## 2016-09-27 DIAGNOSIS — D631 Anemia in chronic kidney disease: Secondary | ICD-10-CM | POA: Diagnosis not present

## 2016-09-27 DIAGNOSIS — N2581 Secondary hyperparathyroidism of renal origin: Secondary | ICD-10-CM | POA: Diagnosis not present

## 2016-09-28 DIAGNOSIS — D631 Anemia in chronic kidney disease: Secondary | ICD-10-CM | POA: Diagnosis not present

## 2016-09-28 DIAGNOSIS — N2581 Secondary hyperparathyroidism of renal origin: Secondary | ICD-10-CM | POA: Diagnosis not present

## 2016-09-28 DIAGNOSIS — N186 End stage renal disease: Secondary | ICD-10-CM | POA: Diagnosis not present

## 2016-09-29 DIAGNOSIS — N2581 Secondary hyperparathyroidism of renal origin: Secondary | ICD-10-CM | POA: Diagnosis not present

## 2016-09-29 DIAGNOSIS — D631 Anemia in chronic kidney disease: Secondary | ICD-10-CM | POA: Diagnosis not present

## 2016-09-29 DIAGNOSIS — N186 End stage renal disease: Secondary | ICD-10-CM | POA: Diagnosis not present

## 2016-09-30 DIAGNOSIS — N2581 Secondary hyperparathyroidism of renal origin: Secondary | ICD-10-CM | POA: Diagnosis not present

## 2016-09-30 DIAGNOSIS — N186 End stage renal disease: Secondary | ICD-10-CM | POA: Diagnosis not present

## 2016-09-30 DIAGNOSIS — D631 Anemia in chronic kidney disease: Secondary | ICD-10-CM | POA: Diagnosis not present

## 2016-10-01 DIAGNOSIS — D631 Anemia in chronic kidney disease: Secondary | ICD-10-CM | POA: Diagnosis not present

## 2016-10-01 DIAGNOSIS — N186 End stage renal disease: Secondary | ICD-10-CM | POA: Diagnosis not present

## 2016-10-01 DIAGNOSIS — N2581 Secondary hyperparathyroidism of renal origin: Secondary | ICD-10-CM | POA: Diagnosis not present

## 2016-10-02 DIAGNOSIS — N2581 Secondary hyperparathyroidism of renal origin: Secondary | ICD-10-CM | POA: Diagnosis not present

## 2016-10-02 DIAGNOSIS — D631 Anemia in chronic kidney disease: Secondary | ICD-10-CM | POA: Diagnosis not present

## 2016-10-02 DIAGNOSIS — N186 End stage renal disease: Secondary | ICD-10-CM | POA: Diagnosis not present

## 2016-10-03 DIAGNOSIS — D631 Anemia in chronic kidney disease: Secondary | ICD-10-CM | POA: Diagnosis not present

## 2016-10-03 DIAGNOSIS — N2581 Secondary hyperparathyroidism of renal origin: Secondary | ICD-10-CM | POA: Diagnosis not present

## 2016-10-03 DIAGNOSIS — N186 End stage renal disease: Secondary | ICD-10-CM | POA: Diagnosis not present

## 2016-10-04 DIAGNOSIS — N2581 Secondary hyperparathyroidism of renal origin: Secondary | ICD-10-CM | POA: Diagnosis not present

## 2016-10-04 DIAGNOSIS — N186 End stage renal disease: Secondary | ICD-10-CM | POA: Diagnosis not present

## 2016-10-04 DIAGNOSIS — D631 Anemia in chronic kidney disease: Secondary | ICD-10-CM | POA: Diagnosis not present

## 2016-10-05 DIAGNOSIS — N2581 Secondary hyperparathyroidism of renal origin: Secondary | ICD-10-CM | POA: Diagnosis not present

## 2016-10-05 DIAGNOSIS — D631 Anemia in chronic kidney disease: Secondary | ICD-10-CM | POA: Diagnosis not present

## 2016-10-05 DIAGNOSIS — N186 End stage renal disease: Secondary | ICD-10-CM | POA: Diagnosis not present

## 2016-10-06 DIAGNOSIS — D631 Anemia in chronic kidney disease: Secondary | ICD-10-CM | POA: Diagnosis not present

## 2016-10-06 DIAGNOSIS — N186 End stage renal disease: Secondary | ICD-10-CM | POA: Diagnosis not present

## 2016-10-06 DIAGNOSIS — N2581 Secondary hyperparathyroidism of renal origin: Secondary | ICD-10-CM | POA: Diagnosis not present

## 2016-10-07 DIAGNOSIS — N186 End stage renal disease: Secondary | ICD-10-CM | POA: Diagnosis not present

## 2016-10-07 DIAGNOSIS — N2581 Secondary hyperparathyroidism of renal origin: Secondary | ICD-10-CM | POA: Diagnosis not present

## 2016-10-07 DIAGNOSIS — D631 Anemia in chronic kidney disease: Secondary | ICD-10-CM | POA: Diagnosis not present

## 2016-10-08 DIAGNOSIS — D631 Anemia in chronic kidney disease: Secondary | ICD-10-CM | POA: Diagnosis not present

## 2016-10-08 DIAGNOSIS — N186 End stage renal disease: Secondary | ICD-10-CM | POA: Diagnosis not present

## 2016-10-08 DIAGNOSIS — N2581 Secondary hyperparathyroidism of renal origin: Secondary | ICD-10-CM | POA: Diagnosis not present

## 2016-10-09 DIAGNOSIS — D631 Anemia in chronic kidney disease: Secondary | ICD-10-CM | POA: Diagnosis not present

## 2016-10-09 DIAGNOSIS — N186 End stage renal disease: Secondary | ICD-10-CM | POA: Diagnosis not present

## 2016-10-09 DIAGNOSIS — N2581 Secondary hyperparathyroidism of renal origin: Secondary | ICD-10-CM | POA: Diagnosis not present

## 2016-10-09 DIAGNOSIS — F29 Unspecified psychosis not due to a substance or known physiological condition: Secondary | ICD-10-CM | POA: Diagnosis not present

## 2016-10-10 DIAGNOSIS — N186 End stage renal disease: Secondary | ICD-10-CM | POA: Diagnosis not present

## 2016-10-10 DIAGNOSIS — R569 Unspecified convulsions: Secondary | ICD-10-CM | POA: Diagnosis not present

## 2016-10-10 DIAGNOSIS — N2581 Secondary hyperparathyroidism of renal origin: Secondary | ICD-10-CM | POA: Diagnosis not present

## 2016-10-10 DIAGNOSIS — D631 Anemia in chronic kidney disease: Secondary | ICD-10-CM | POA: Diagnosis not present

## 2016-10-11 DIAGNOSIS — N2581 Secondary hyperparathyroidism of renal origin: Secondary | ICD-10-CM | POA: Diagnosis not present

## 2016-10-11 DIAGNOSIS — D631 Anemia in chronic kidney disease: Secondary | ICD-10-CM | POA: Diagnosis not present

## 2016-10-11 DIAGNOSIS — N186 End stage renal disease: Secondary | ICD-10-CM | POA: Diagnosis not present

## 2016-10-12 DIAGNOSIS — N186 End stage renal disease: Secondary | ICD-10-CM | POA: Diagnosis not present

## 2016-10-12 DIAGNOSIS — N2581 Secondary hyperparathyroidism of renal origin: Secondary | ICD-10-CM | POA: Diagnosis not present

## 2016-10-12 DIAGNOSIS — D631 Anemia in chronic kidney disease: Secondary | ICD-10-CM | POA: Diagnosis not present

## 2016-10-13 DIAGNOSIS — D631 Anemia in chronic kidney disease: Secondary | ICD-10-CM | POA: Diagnosis not present

## 2016-10-13 DIAGNOSIS — N2581 Secondary hyperparathyroidism of renal origin: Secondary | ICD-10-CM | POA: Diagnosis not present

## 2016-10-13 DIAGNOSIS — N186 End stage renal disease: Secondary | ICD-10-CM | POA: Diagnosis not present

## 2016-10-14 DIAGNOSIS — D631 Anemia in chronic kidney disease: Secondary | ICD-10-CM | POA: Diagnosis not present

## 2016-10-14 DIAGNOSIS — N2581 Secondary hyperparathyroidism of renal origin: Secondary | ICD-10-CM | POA: Diagnosis not present

## 2016-10-14 DIAGNOSIS — N186 End stage renal disease: Secondary | ICD-10-CM | POA: Diagnosis not present

## 2016-10-15 DIAGNOSIS — N2581 Secondary hyperparathyroidism of renal origin: Secondary | ICD-10-CM | POA: Diagnosis not present

## 2016-10-15 DIAGNOSIS — N186 End stage renal disease: Secondary | ICD-10-CM | POA: Diagnosis not present

## 2016-10-15 DIAGNOSIS — D631 Anemia in chronic kidney disease: Secondary | ICD-10-CM | POA: Diagnosis not present

## 2016-10-16 DIAGNOSIS — N186 End stage renal disease: Secondary | ICD-10-CM | POA: Diagnosis not present

## 2016-10-16 DIAGNOSIS — D631 Anemia in chronic kidney disease: Secondary | ICD-10-CM | POA: Diagnosis not present

## 2016-10-16 DIAGNOSIS — N2581 Secondary hyperparathyroidism of renal origin: Secondary | ICD-10-CM | POA: Diagnosis not present

## 2016-10-17 DIAGNOSIS — D631 Anemia in chronic kidney disease: Secondary | ICD-10-CM | POA: Diagnosis not present

## 2016-10-17 DIAGNOSIS — N186 End stage renal disease: Secondary | ICD-10-CM | POA: Diagnosis not present

## 2016-10-17 DIAGNOSIS — N2581 Secondary hyperparathyroidism of renal origin: Secondary | ICD-10-CM | POA: Diagnosis not present

## 2016-10-18 DIAGNOSIS — N2581 Secondary hyperparathyroidism of renal origin: Secondary | ICD-10-CM | POA: Diagnosis not present

## 2016-10-18 DIAGNOSIS — N186 End stage renal disease: Secondary | ICD-10-CM | POA: Diagnosis not present

## 2016-10-18 DIAGNOSIS — D631 Anemia in chronic kidney disease: Secondary | ICD-10-CM | POA: Diagnosis not present

## 2016-10-19 ENCOUNTER — Other Ambulatory Visit: Payer: Self-pay | Admitting: Internal Medicine

## 2016-10-19 DIAGNOSIS — N2581 Secondary hyperparathyroidism of renal origin: Secondary | ICD-10-CM | POA: Diagnosis not present

## 2016-10-19 DIAGNOSIS — D631 Anemia in chronic kidney disease: Secondary | ICD-10-CM | POA: Diagnosis not present

## 2016-10-19 DIAGNOSIS — N186 End stage renal disease: Secondary | ICD-10-CM | POA: Diagnosis not present

## 2016-10-19 DIAGNOSIS — Z1231 Encounter for screening mammogram for malignant neoplasm of breast: Secondary | ICD-10-CM

## 2016-10-19 DIAGNOSIS — F29 Unspecified psychosis not due to a substance or known physiological condition: Secondary | ICD-10-CM | POA: Diagnosis not present

## 2016-10-20 DIAGNOSIS — D631 Anemia in chronic kidney disease: Secondary | ICD-10-CM | POA: Diagnosis not present

## 2016-10-20 DIAGNOSIS — N186 End stage renal disease: Secondary | ICD-10-CM | POA: Diagnosis not present

## 2016-10-20 DIAGNOSIS — N2581 Secondary hyperparathyroidism of renal origin: Secondary | ICD-10-CM | POA: Diagnosis not present

## 2016-10-21 DIAGNOSIS — N2581 Secondary hyperparathyroidism of renal origin: Secondary | ICD-10-CM | POA: Diagnosis not present

## 2016-10-21 DIAGNOSIS — N186 End stage renal disease: Secondary | ICD-10-CM | POA: Diagnosis not present

## 2016-10-21 DIAGNOSIS — D631 Anemia in chronic kidney disease: Secondary | ICD-10-CM | POA: Diagnosis not present

## 2016-10-22 DIAGNOSIS — N186 End stage renal disease: Secondary | ICD-10-CM | POA: Diagnosis not present

## 2016-10-22 DIAGNOSIS — N2581 Secondary hyperparathyroidism of renal origin: Secondary | ICD-10-CM | POA: Diagnosis not present

## 2016-10-22 DIAGNOSIS — Z992 Dependence on renal dialysis: Secondary | ICD-10-CM | POA: Diagnosis not present

## 2016-10-22 DIAGNOSIS — D631 Anemia in chronic kidney disease: Secondary | ICD-10-CM | POA: Diagnosis not present

## 2016-10-23 DIAGNOSIS — N186 End stage renal disease: Secondary | ICD-10-CM | POA: Diagnosis not present

## 2016-10-23 DIAGNOSIS — D631 Anemia in chronic kidney disease: Secondary | ICD-10-CM | POA: Diagnosis not present

## 2016-10-23 DIAGNOSIS — N2581 Secondary hyperparathyroidism of renal origin: Secondary | ICD-10-CM | POA: Diagnosis not present

## 2016-10-24 DIAGNOSIS — N186 End stage renal disease: Secondary | ICD-10-CM | POA: Diagnosis not present

## 2016-10-24 DIAGNOSIS — N2581 Secondary hyperparathyroidism of renal origin: Secondary | ICD-10-CM | POA: Diagnosis not present

## 2016-10-24 DIAGNOSIS — D631 Anemia in chronic kidney disease: Secondary | ICD-10-CM | POA: Diagnosis not present

## 2016-10-25 DIAGNOSIS — D631 Anemia in chronic kidney disease: Secondary | ICD-10-CM | POA: Diagnosis not present

## 2016-10-25 DIAGNOSIS — N2581 Secondary hyperparathyroidism of renal origin: Secondary | ICD-10-CM | POA: Diagnosis not present

## 2016-10-25 DIAGNOSIS — N186 End stage renal disease: Secondary | ICD-10-CM | POA: Diagnosis not present

## 2016-10-26 DIAGNOSIS — N186 End stage renal disease: Secondary | ICD-10-CM | POA: Diagnosis not present

## 2016-10-26 DIAGNOSIS — D631 Anemia in chronic kidney disease: Secondary | ICD-10-CM | POA: Diagnosis not present

## 2016-10-26 DIAGNOSIS — N2581 Secondary hyperparathyroidism of renal origin: Secondary | ICD-10-CM | POA: Diagnosis not present

## 2016-10-27 DIAGNOSIS — N2581 Secondary hyperparathyroidism of renal origin: Secondary | ICD-10-CM | POA: Diagnosis not present

## 2016-10-27 DIAGNOSIS — N186 End stage renal disease: Secondary | ICD-10-CM | POA: Diagnosis not present

## 2016-10-27 DIAGNOSIS — D631 Anemia in chronic kidney disease: Secondary | ICD-10-CM | POA: Diagnosis not present

## 2016-10-28 DIAGNOSIS — N2581 Secondary hyperparathyroidism of renal origin: Secondary | ICD-10-CM | POA: Diagnosis not present

## 2016-10-28 DIAGNOSIS — D631 Anemia in chronic kidney disease: Secondary | ICD-10-CM | POA: Diagnosis not present

## 2016-10-28 DIAGNOSIS — N186 End stage renal disease: Secondary | ICD-10-CM | POA: Diagnosis not present

## 2016-10-29 DIAGNOSIS — N2581 Secondary hyperparathyroidism of renal origin: Secondary | ICD-10-CM | POA: Diagnosis not present

## 2016-10-29 DIAGNOSIS — D631 Anemia in chronic kidney disease: Secondary | ICD-10-CM | POA: Diagnosis not present

## 2016-10-29 DIAGNOSIS — N186 End stage renal disease: Secondary | ICD-10-CM | POA: Diagnosis not present

## 2016-10-30 DIAGNOSIS — N186 End stage renal disease: Secondary | ICD-10-CM | POA: Diagnosis not present

## 2016-10-30 DIAGNOSIS — N2581 Secondary hyperparathyroidism of renal origin: Secondary | ICD-10-CM | POA: Diagnosis not present

## 2016-10-30 DIAGNOSIS — D631 Anemia in chronic kidney disease: Secondary | ICD-10-CM | POA: Diagnosis not present

## 2016-10-31 DIAGNOSIS — N186 End stage renal disease: Secondary | ICD-10-CM | POA: Diagnosis not present

## 2016-10-31 DIAGNOSIS — D631 Anemia in chronic kidney disease: Secondary | ICD-10-CM | POA: Diagnosis not present

## 2016-10-31 DIAGNOSIS — N2581 Secondary hyperparathyroidism of renal origin: Secondary | ICD-10-CM | POA: Diagnosis not present

## 2016-11-01 DIAGNOSIS — N186 End stage renal disease: Secondary | ICD-10-CM | POA: Diagnosis not present

## 2016-11-01 DIAGNOSIS — N2581 Secondary hyperparathyroidism of renal origin: Secondary | ICD-10-CM | POA: Diagnosis not present

## 2016-11-01 DIAGNOSIS — D631 Anemia in chronic kidney disease: Secondary | ICD-10-CM | POA: Diagnosis not present

## 2016-11-02 DIAGNOSIS — N186 End stage renal disease: Secondary | ICD-10-CM | POA: Diagnosis not present

## 2016-11-02 DIAGNOSIS — D631 Anemia in chronic kidney disease: Secondary | ICD-10-CM | POA: Diagnosis not present

## 2016-11-02 DIAGNOSIS — N2581 Secondary hyperparathyroidism of renal origin: Secondary | ICD-10-CM | POA: Diagnosis not present

## 2016-11-03 DIAGNOSIS — N186 End stage renal disease: Secondary | ICD-10-CM | POA: Diagnosis not present

## 2016-11-03 DIAGNOSIS — N2581 Secondary hyperparathyroidism of renal origin: Secondary | ICD-10-CM | POA: Diagnosis not present

## 2016-11-03 DIAGNOSIS — D631 Anemia in chronic kidney disease: Secondary | ICD-10-CM | POA: Diagnosis not present

## 2016-11-04 DIAGNOSIS — N186 End stage renal disease: Secondary | ICD-10-CM | POA: Diagnosis not present

## 2016-11-04 DIAGNOSIS — N2581 Secondary hyperparathyroidism of renal origin: Secondary | ICD-10-CM | POA: Diagnosis not present

## 2016-11-04 DIAGNOSIS — D631 Anemia in chronic kidney disease: Secondary | ICD-10-CM | POA: Diagnosis not present

## 2016-11-05 DIAGNOSIS — D631 Anemia in chronic kidney disease: Secondary | ICD-10-CM | POA: Diagnosis not present

## 2016-11-05 DIAGNOSIS — N2581 Secondary hyperparathyroidism of renal origin: Secondary | ICD-10-CM | POA: Diagnosis not present

## 2016-11-05 DIAGNOSIS — N186 End stage renal disease: Secondary | ICD-10-CM | POA: Diagnosis not present

## 2016-11-06 DIAGNOSIS — D539 Nutritional anemia, unspecified: Secondary | ICD-10-CM | POA: Diagnosis not present

## 2016-11-06 DIAGNOSIS — Z0000 Encounter for general adult medical examination without abnormal findings: Secondary | ICD-10-CM | POA: Diagnosis not present

## 2016-11-06 DIAGNOSIS — I119 Hypertensive heart disease without heart failure: Secondary | ICD-10-CM | POA: Diagnosis not present

## 2016-11-06 DIAGNOSIS — N2581 Secondary hyperparathyroidism of renal origin: Secondary | ICD-10-CM | POA: Diagnosis not present

## 2016-11-06 DIAGNOSIS — E039 Hypothyroidism, unspecified: Secondary | ICD-10-CM | POA: Diagnosis not present

## 2016-11-06 DIAGNOSIS — I10 Essential (primary) hypertension: Secondary | ICD-10-CM | POA: Diagnosis not present

## 2016-11-06 DIAGNOSIS — D631 Anemia in chronic kidney disease: Secondary | ICD-10-CM | POA: Diagnosis not present

## 2016-11-06 DIAGNOSIS — E785 Hyperlipidemia, unspecified: Secondary | ICD-10-CM | POA: Diagnosis not present

## 2016-11-06 DIAGNOSIS — N186 End stage renal disease: Secondary | ICD-10-CM | POA: Diagnosis not present

## 2016-11-06 DIAGNOSIS — R569 Unspecified convulsions: Secondary | ICD-10-CM | POA: Diagnosis not present

## 2016-11-06 DIAGNOSIS — Z30019 Encounter for initial prescription of contraceptives, unspecified: Secondary | ICD-10-CM | POA: Diagnosis not present

## 2016-11-07 DIAGNOSIS — D631 Anemia in chronic kidney disease: Secondary | ICD-10-CM | POA: Diagnosis not present

## 2016-11-07 DIAGNOSIS — N186 End stage renal disease: Secondary | ICD-10-CM | POA: Diagnosis not present

## 2016-11-07 DIAGNOSIS — N2581 Secondary hyperparathyroidism of renal origin: Secondary | ICD-10-CM | POA: Diagnosis not present

## 2016-11-08 DIAGNOSIS — D631 Anemia in chronic kidney disease: Secondary | ICD-10-CM | POA: Diagnosis not present

## 2016-11-08 DIAGNOSIS — N186 End stage renal disease: Secondary | ICD-10-CM | POA: Diagnosis not present

## 2016-11-08 DIAGNOSIS — N2581 Secondary hyperparathyroidism of renal origin: Secondary | ICD-10-CM | POA: Diagnosis not present

## 2016-11-09 DIAGNOSIS — D631 Anemia in chronic kidney disease: Secondary | ICD-10-CM | POA: Diagnosis not present

## 2016-11-09 DIAGNOSIS — N2581 Secondary hyperparathyroidism of renal origin: Secondary | ICD-10-CM | POA: Diagnosis not present

## 2016-11-09 DIAGNOSIS — N186 End stage renal disease: Secondary | ICD-10-CM | POA: Diagnosis not present

## 2016-11-10 DIAGNOSIS — N2581 Secondary hyperparathyroidism of renal origin: Secondary | ICD-10-CM | POA: Diagnosis not present

## 2016-11-10 DIAGNOSIS — N186 End stage renal disease: Secondary | ICD-10-CM | POA: Diagnosis not present

## 2016-11-10 DIAGNOSIS — D631 Anemia in chronic kidney disease: Secondary | ICD-10-CM | POA: Diagnosis not present

## 2016-11-11 DIAGNOSIS — N2581 Secondary hyperparathyroidism of renal origin: Secondary | ICD-10-CM | POA: Diagnosis not present

## 2016-11-11 DIAGNOSIS — D631 Anemia in chronic kidney disease: Secondary | ICD-10-CM | POA: Diagnosis not present

## 2016-11-11 DIAGNOSIS — N186 End stage renal disease: Secondary | ICD-10-CM | POA: Diagnosis not present

## 2016-11-12 DIAGNOSIS — N2581 Secondary hyperparathyroidism of renal origin: Secondary | ICD-10-CM | POA: Diagnosis not present

## 2016-11-12 DIAGNOSIS — N186 End stage renal disease: Secondary | ICD-10-CM | POA: Diagnosis not present

## 2016-11-12 DIAGNOSIS — D631 Anemia in chronic kidney disease: Secondary | ICD-10-CM | POA: Diagnosis not present

## 2016-11-13 DIAGNOSIS — N2581 Secondary hyperparathyroidism of renal origin: Secondary | ICD-10-CM | POA: Diagnosis not present

## 2016-11-13 DIAGNOSIS — D631 Anemia in chronic kidney disease: Secondary | ICD-10-CM | POA: Diagnosis not present

## 2016-11-13 DIAGNOSIS — N186 End stage renal disease: Secondary | ICD-10-CM | POA: Diagnosis not present

## 2016-11-14 DIAGNOSIS — D631 Anemia in chronic kidney disease: Secondary | ICD-10-CM | POA: Diagnosis not present

## 2016-11-14 DIAGNOSIS — N2581 Secondary hyperparathyroidism of renal origin: Secondary | ICD-10-CM | POA: Diagnosis not present

## 2016-11-14 DIAGNOSIS — N186 End stage renal disease: Secondary | ICD-10-CM | POA: Diagnosis not present

## 2016-11-15 DIAGNOSIS — N2581 Secondary hyperparathyroidism of renal origin: Secondary | ICD-10-CM | POA: Diagnosis not present

## 2016-11-15 DIAGNOSIS — N186 End stage renal disease: Secondary | ICD-10-CM | POA: Diagnosis not present

## 2016-11-15 DIAGNOSIS — D631 Anemia in chronic kidney disease: Secondary | ICD-10-CM | POA: Diagnosis not present

## 2016-11-16 DIAGNOSIS — N2581 Secondary hyperparathyroidism of renal origin: Secondary | ICD-10-CM | POA: Diagnosis not present

## 2016-11-16 DIAGNOSIS — D631 Anemia in chronic kidney disease: Secondary | ICD-10-CM | POA: Diagnosis not present

## 2016-11-16 DIAGNOSIS — N186 End stage renal disease: Secondary | ICD-10-CM | POA: Diagnosis not present

## 2016-11-17 DIAGNOSIS — N186 End stage renal disease: Secondary | ICD-10-CM | POA: Diagnosis not present

## 2016-11-17 DIAGNOSIS — D631 Anemia in chronic kidney disease: Secondary | ICD-10-CM | POA: Diagnosis not present

## 2016-11-17 DIAGNOSIS — N2581 Secondary hyperparathyroidism of renal origin: Secondary | ICD-10-CM | POA: Diagnosis not present

## 2016-11-18 DIAGNOSIS — N186 End stage renal disease: Secondary | ICD-10-CM | POA: Diagnosis not present

## 2016-11-18 DIAGNOSIS — N2581 Secondary hyperparathyroidism of renal origin: Secondary | ICD-10-CM | POA: Diagnosis not present

## 2016-11-18 DIAGNOSIS — D631 Anemia in chronic kidney disease: Secondary | ICD-10-CM | POA: Diagnosis not present

## 2016-11-19 DIAGNOSIS — N2581 Secondary hyperparathyroidism of renal origin: Secondary | ICD-10-CM | POA: Diagnosis not present

## 2016-11-19 DIAGNOSIS — D631 Anemia in chronic kidney disease: Secondary | ICD-10-CM | POA: Diagnosis not present

## 2016-11-19 DIAGNOSIS — N186 End stage renal disease: Secondary | ICD-10-CM | POA: Diagnosis not present

## 2016-11-20 DIAGNOSIS — N186 End stage renal disease: Secondary | ICD-10-CM | POA: Diagnosis not present

## 2016-11-20 DIAGNOSIS — N2581 Secondary hyperparathyroidism of renal origin: Secondary | ICD-10-CM | POA: Diagnosis not present

## 2016-11-20 DIAGNOSIS — D631 Anemia in chronic kidney disease: Secondary | ICD-10-CM | POA: Diagnosis not present

## 2016-11-21 DIAGNOSIS — N2581 Secondary hyperparathyroidism of renal origin: Secondary | ICD-10-CM | POA: Diagnosis not present

## 2016-11-21 DIAGNOSIS — D631 Anemia in chronic kidney disease: Secondary | ICD-10-CM | POA: Diagnosis not present

## 2016-11-21 DIAGNOSIS — N186 End stage renal disease: Secondary | ICD-10-CM | POA: Diagnosis not present

## 2016-11-22 DIAGNOSIS — N2581 Secondary hyperparathyroidism of renal origin: Secondary | ICD-10-CM | POA: Diagnosis not present

## 2016-11-22 DIAGNOSIS — N186 End stage renal disease: Secondary | ICD-10-CM | POA: Diagnosis not present

## 2016-11-22 DIAGNOSIS — Z992 Dependence on renal dialysis: Secondary | ICD-10-CM | POA: Diagnosis not present

## 2016-11-22 DIAGNOSIS — D631 Anemia in chronic kidney disease: Secondary | ICD-10-CM | POA: Diagnosis not present

## 2016-11-23 DIAGNOSIS — M109 Gout, unspecified: Secondary | ICD-10-CM | POA: Diagnosis not present

## 2016-11-23 DIAGNOSIS — N2581 Secondary hyperparathyroidism of renal origin: Secondary | ICD-10-CM | POA: Diagnosis not present

## 2016-11-23 DIAGNOSIS — N186 End stage renal disease: Secondary | ICD-10-CM | POA: Diagnosis not present

## 2016-11-23 DIAGNOSIS — R569 Unspecified convulsions: Secondary | ICD-10-CM | POA: Diagnosis not present

## 2016-11-23 DIAGNOSIS — D631 Anemia in chronic kidney disease: Secondary | ICD-10-CM | POA: Diagnosis not present

## 2016-11-24 DIAGNOSIS — N186 End stage renal disease: Secondary | ICD-10-CM | POA: Diagnosis not present

## 2016-11-24 DIAGNOSIS — N2581 Secondary hyperparathyroidism of renal origin: Secondary | ICD-10-CM | POA: Diagnosis not present

## 2016-11-24 DIAGNOSIS — D631 Anemia in chronic kidney disease: Secondary | ICD-10-CM | POA: Diagnosis not present

## 2016-11-25 DIAGNOSIS — N2581 Secondary hyperparathyroidism of renal origin: Secondary | ICD-10-CM | POA: Diagnosis not present

## 2016-11-25 DIAGNOSIS — D631 Anemia in chronic kidney disease: Secondary | ICD-10-CM | POA: Diagnosis not present

## 2016-11-25 DIAGNOSIS — N186 End stage renal disease: Secondary | ICD-10-CM | POA: Diagnosis not present

## 2016-11-26 DIAGNOSIS — N2581 Secondary hyperparathyroidism of renal origin: Secondary | ICD-10-CM | POA: Diagnosis not present

## 2016-11-26 DIAGNOSIS — D631 Anemia in chronic kidney disease: Secondary | ICD-10-CM | POA: Diagnosis not present

## 2016-11-26 DIAGNOSIS — N186 End stage renal disease: Secondary | ICD-10-CM | POA: Diagnosis not present

## 2016-11-27 ENCOUNTER — Ambulatory Visit
Admission: RE | Admit: 2016-11-27 | Discharge: 2016-11-27 | Disposition: A | Discharge status: Home / Self Care | Payer: Medicare Other | Source: Ambulatory Visit | Attending: Internal Medicine | Admitting: Internal Medicine

## 2016-11-27 DIAGNOSIS — Z1231 Encounter for screening mammogram for malignant neoplasm of breast: Secondary | ICD-10-CM | POA: Diagnosis not present

## 2016-11-27 DIAGNOSIS — N2581 Secondary hyperparathyroidism of renal origin: Secondary | ICD-10-CM | POA: Diagnosis not present

## 2016-11-27 DIAGNOSIS — D631 Anemia in chronic kidney disease: Secondary | ICD-10-CM | POA: Diagnosis not present

## 2016-11-27 DIAGNOSIS — N186 End stage renal disease: Secondary | ICD-10-CM | POA: Diagnosis not present

## 2016-11-28 DIAGNOSIS — N2581 Secondary hyperparathyroidism of renal origin: Secondary | ICD-10-CM | POA: Diagnosis not present

## 2016-11-28 DIAGNOSIS — D631 Anemia in chronic kidney disease: Secondary | ICD-10-CM | POA: Diagnosis not present

## 2016-11-28 DIAGNOSIS — N186 End stage renal disease: Secondary | ICD-10-CM | POA: Diagnosis not present

## 2016-11-29 DIAGNOSIS — N186 End stage renal disease: Secondary | ICD-10-CM | POA: Diagnosis not present

## 2016-11-29 DIAGNOSIS — D631 Anemia in chronic kidney disease: Secondary | ICD-10-CM | POA: Diagnosis not present

## 2016-11-29 DIAGNOSIS — N2581 Secondary hyperparathyroidism of renal origin: Secondary | ICD-10-CM | POA: Diagnosis not present

## 2016-11-30 DIAGNOSIS — D631 Anemia in chronic kidney disease: Secondary | ICD-10-CM | POA: Diagnosis not present

## 2016-11-30 DIAGNOSIS — N186 End stage renal disease: Secondary | ICD-10-CM | POA: Diagnosis not present

## 2016-11-30 DIAGNOSIS — N2581 Secondary hyperparathyroidism of renal origin: Secondary | ICD-10-CM | POA: Diagnosis not present

## 2016-12-01 DIAGNOSIS — N2581 Secondary hyperparathyroidism of renal origin: Secondary | ICD-10-CM | POA: Diagnosis not present

## 2016-12-01 DIAGNOSIS — N186 End stage renal disease: Secondary | ICD-10-CM | POA: Diagnosis not present

## 2016-12-01 DIAGNOSIS — D631 Anemia in chronic kidney disease: Secondary | ICD-10-CM | POA: Diagnosis not present

## 2016-12-02 DIAGNOSIS — N2581 Secondary hyperparathyroidism of renal origin: Secondary | ICD-10-CM | POA: Diagnosis not present

## 2016-12-02 DIAGNOSIS — N186 End stage renal disease: Secondary | ICD-10-CM | POA: Diagnosis not present

## 2016-12-02 DIAGNOSIS — D631 Anemia in chronic kidney disease: Secondary | ICD-10-CM | POA: Diagnosis not present

## 2016-12-03 DIAGNOSIS — D631 Anemia in chronic kidney disease: Secondary | ICD-10-CM | POA: Diagnosis not present

## 2016-12-03 DIAGNOSIS — N2581 Secondary hyperparathyroidism of renal origin: Secondary | ICD-10-CM | POA: Diagnosis not present

## 2016-12-03 DIAGNOSIS — N186 End stage renal disease: Secondary | ICD-10-CM | POA: Diagnosis not present

## 2016-12-04 DIAGNOSIS — D631 Anemia in chronic kidney disease: Secondary | ICD-10-CM | POA: Diagnosis not present

## 2016-12-04 DIAGNOSIS — N186 End stage renal disease: Secondary | ICD-10-CM | POA: Diagnosis not present

## 2016-12-04 DIAGNOSIS — N2581 Secondary hyperparathyroidism of renal origin: Secondary | ICD-10-CM | POA: Diagnosis not present

## 2016-12-05 DIAGNOSIS — N186 End stage renal disease: Secondary | ICD-10-CM | POA: Diagnosis not present

## 2016-12-05 DIAGNOSIS — N2581 Secondary hyperparathyroidism of renal origin: Secondary | ICD-10-CM | POA: Diagnosis not present

## 2016-12-05 DIAGNOSIS — D631 Anemia in chronic kidney disease: Secondary | ICD-10-CM | POA: Diagnosis not present

## 2016-12-06 DIAGNOSIS — N2581 Secondary hyperparathyroidism of renal origin: Secondary | ICD-10-CM | POA: Diagnosis not present

## 2016-12-06 DIAGNOSIS — D631 Anemia in chronic kidney disease: Secondary | ICD-10-CM | POA: Diagnosis not present

## 2016-12-06 DIAGNOSIS — N186 End stage renal disease: Secondary | ICD-10-CM | POA: Diagnosis not present

## 2016-12-07 DIAGNOSIS — N186 End stage renal disease: Secondary | ICD-10-CM | POA: Diagnosis not present

## 2016-12-07 DIAGNOSIS — D631 Anemia in chronic kidney disease: Secondary | ICD-10-CM | POA: Diagnosis not present

## 2016-12-07 DIAGNOSIS — N2581 Secondary hyperparathyroidism of renal origin: Secondary | ICD-10-CM | POA: Diagnosis not present

## 2016-12-08 DIAGNOSIS — N2581 Secondary hyperparathyroidism of renal origin: Secondary | ICD-10-CM | POA: Diagnosis not present

## 2016-12-08 DIAGNOSIS — D631 Anemia in chronic kidney disease: Secondary | ICD-10-CM | POA: Diagnosis not present

## 2016-12-08 DIAGNOSIS — N186 End stage renal disease: Secondary | ICD-10-CM | POA: Diagnosis not present

## 2016-12-09 DIAGNOSIS — D631 Anemia in chronic kidney disease: Secondary | ICD-10-CM | POA: Diagnosis not present

## 2016-12-09 DIAGNOSIS — N2581 Secondary hyperparathyroidism of renal origin: Secondary | ICD-10-CM | POA: Diagnosis not present

## 2016-12-09 DIAGNOSIS — N186 End stage renal disease: Secondary | ICD-10-CM | POA: Diagnosis not present

## 2016-12-10 DIAGNOSIS — N186 End stage renal disease: Secondary | ICD-10-CM | POA: Diagnosis not present

## 2016-12-10 DIAGNOSIS — N2581 Secondary hyperparathyroidism of renal origin: Secondary | ICD-10-CM | POA: Diagnosis not present

## 2016-12-10 DIAGNOSIS — D631 Anemia in chronic kidney disease: Secondary | ICD-10-CM | POA: Diagnosis not present

## 2016-12-11 DIAGNOSIS — D631 Anemia in chronic kidney disease: Secondary | ICD-10-CM | POA: Diagnosis not present

## 2016-12-11 DIAGNOSIS — N2581 Secondary hyperparathyroidism of renal origin: Secondary | ICD-10-CM | POA: Diagnosis not present

## 2016-12-11 DIAGNOSIS — N186 End stage renal disease: Secondary | ICD-10-CM | POA: Diagnosis not present

## 2016-12-12 DIAGNOSIS — N186 End stage renal disease: Secondary | ICD-10-CM | POA: Diagnosis not present

## 2016-12-12 DIAGNOSIS — N2581 Secondary hyperparathyroidism of renal origin: Secondary | ICD-10-CM | POA: Diagnosis not present

## 2016-12-12 DIAGNOSIS — D631 Anemia in chronic kidney disease: Secondary | ICD-10-CM | POA: Diagnosis not present

## 2016-12-13 DIAGNOSIS — D631 Anemia in chronic kidney disease: Secondary | ICD-10-CM | POA: Diagnosis not present

## 2016-12-13 DIAGNOSIS — N186 End stage renal disease: Secondary | ICD-10-CM | POA: Diagnosis not present

## 2016-12-13 DIAGNOSIS — F29 Unspecified psychosis not due to a substance or known physiological condition: Secondary | ICD-10-CM | POA: Diagnosis not present

## 2016-12-13 DIAGNOSIS — N2581 Secondary hyperparathyroidism of renal origin: Secondary | ICD-10-CM | POA: Diagnosis not present

## 2016-12-14 DIAGNOSIS — D631 Anemia in chronic kidney disease: Secondary | ICD-10-CM | POA: Diagnosis not present

## 2016-12-14 DIAGNOSIS — N186 End stage renal disease: Secondary | ICD-10-CM | POA: Diagnosis not present

## 2016-12-14 DIAGNOSIS — N2581 Secondary hyperparathyroidism of renal origin: Secondary | ICD-10-CM | POA: Diagnosis not present

## 2016-12-15 DIAGNOSIS — N2581 Secondary hyperparathyroidism of renal origin: Secondary | ICD-10-CM | POA: Diagnosis not present

## 2016-12-15 DIAGNOSIS — N186 End stage renal disease: Secondary | ICD-10-CM | POA: Diagnosis not present

## 2016-12-15 DIAGNOSIS — D631 Anemia in chronic kidney disease: Secondary | ICD-10-CM | POA: Diagnosis not present

## 2016-12-16 DIAGNOSIS — N186 End stage renal disease: Secondary | ICD-10-CM | POA: Diagnosis not present

## 2016-12-16 DIAGNOSIS — D631 Anemia in chronic kidney disease: Secondary | ICD-10-CM | POA: Diagnosis not present

## 2016-12-16 DIAGNOSIS — N2581 Secondary hyperparathyroidism of renal origin: Secondary | ICD-10-CM | POA: Diagnosis not present

## 2016-12-17 DIAGNOSIS — D631 Anemia in chronic kidney disease: Secondary | ICD-10-CM | POA: Diagnosis not present

## 2016-12-17 DIAGNOSIS — N2581 Secondary hyperparathyroidism of renal origin: Secondary | ICD-10-CM | POA: Diagnosis not present

## 2016-12-17 DIAGNOSIS — N186 End stage renal disease: Secondary | ICD-10-CM | POA: Diagnosis not present

## 2016-12-18 DIAGNOSIS — N186 End stage renal disease: Secondary | ICD-10-CM | POA: Diagnosis not present

## 2016-12-18 DIAGNOSIS — N2581 Secondary hyperparathyroidism of renal origin: Secondary | ICD-10-CM | POA: Diagnosis not present

## 2016-12-18 DIAGNOSIS — D631 Anemia in chronic kidney disease: Secondary | ICD-10-CM | POA: Diagnosis not present

## 2016-12-19 DIAGNOSIS — N2581 Secondary hyperparathyroidism of renal origin: Secondary | ICD-10-CM | POA: Diagnosis not present

## 2016-12-19 DIAGNOSIS — N186 End stage renal disease: Secondary | ICD-10-CM | POA: Diagnosis not present

## 2016-12-19 DIAGNOSIS — D631 Anemia in chronic kidney disease: Secondary | ICD-10-CM | POA: Diagnosis not present

## 2016-12-20 DIAGNOSIS — N186 End stage renal disease: Secondary | ICD-10-CM | POA: Diagnosis not present

## 2016-12-20 DIAGNOSIS — D631 Anemia in chronic kidney disease: Secondary | ICD-10-CM | POA: Diagnosis not present

## 2016-12-20 DIAGNOSIS — Z992 Dependence on renal dialysis: Secondary | ICD-10-CM | POA: Diagnosis not present

## 2016-12-20 DIAGNOSIS — N2581 Secondary hyperparathyroidism of renal origin: Secondary | ICD-10-CM | POA: Diagnosis not present

## 2016-12-21 DIAGNOSIS — D631 Anemia in chronic kidney disease: Secondary | ICD-10-CM | POA: Diagnosis not present

## 2016-12-21 DIAGNOSIS — N2581 Secondary hyperparathyroidism of renal origin: Secondary | ICD-10-CM | POA: Diagnosis not present

## 2016-12-21 DIAGNOSIS — N186 End stage renal disease: Secondary | ICD-10-CM | POA: Diagnosis not present

## 2016-12-21 DIAGNOSIS — E213 Hyperparathyroidism, unspecified: Secondary | ICD-10-CM | POA: Diagnosis not present

## 2016-12-22 DIAGNOSIS — E213 Hyperparathyroidism, unspecified: Secondary | ICD-10-CM | POA: Diagnosis not present

## 2016-12-22 DIAGNOSIS — N2581 Secondary hyperparathyroidism of renal origin: Secondary | ICD-10-CM | POA: Diagnosis not present

## 2016-12-22 DIAGNOSIS — N186 End stage renal disease: Secondary | ICD-10-CM | POA: Diagnosis not present

## 2016-12-22 DIAGNOSIS — D631 Anemia in chronic kidney disease: Secondary | ICD-10-CM | POA: Diagnosis not present

## 2016-12-23 DIAGNOSIS — N2581 Secondary hyperparathyroidism of renal origin: Secondary | ICD-10-CM | POA: Diagnosis not present

## 2016-12-23 DIAGNOSIS — E213 Hyperparathyroidism, unspecified: Secondary | ICD-10-CM | POA: Diagnosis not present

## 2016-12-23 DIAGNOSIS — D631 Anemia in chronic kidney disease: Secondary | ICD-10-CM | POA: Diagnosis not present

## 2016-12-23 DIAGNOSIS — N186 End stage renal disease: Secondary | ICD-10-CM | POA: Diagnosis not present

## 2016-12-24 DIAGNOSIS — D631 Anemia in chronic kidney disease: Secondary | ICD-10-CM | POA: Diagnosis not present

## 2016-12-24 DIAGNOSIS — N186 End stage renal disease: Secondary | ICD-10-CM | POA: Diagnosis not present

## 2016-12-24 DIAGNOSIS — N2581 Secondary hyperparathyroidism of renal origin: Secondary | ICD-10-CM | POA: Diagnosis not present

## 2016-12-24 DIAGNOSIS — E213 Hyperparathyroidism, unspecified: Secondary | ICD-10-CM | POA: Diagnosis not present

## 2016-12-25 DIAGNOSIS — N2581 Secondary hyperparathyroidism of renal origin: Secondary | ICD-10-CM | POA: Diagnosis not present

## 2016-12-25 DIAGNOSIS — D631 Anemia in chronic kidney disease: Secondary | ICD-10-CM | POA: Diagnosis not present

## 2016-12-25 DIAGNOSIS — E213 Hyperparathyroidism, unspecified: Secondary | ICD-10-CM | POA: Diagnosis not present

## 2016-12-25 DIAGNOSIS — N186 End stage renal disease: Secondary | ICD-10-CM | POA: Diagnosis not present

## 2016-12-26 DIAGNOSIS — N186 End stage renal disease: Secondary | ICD-10-CM | POA: Diagnosis not present

## 2016-12-26 DIAGNOSIS — N2581 Secondary hyperparathyroidism of renal origin: Secondary | ICD-10-CM | POA: Diagnosis not present

## 2016-12-26 DIAGNOSIS — E213 Hyperparathyroidism, unspecified: Secondary | ICD-10-CM | POA: Diagnosis not present

## 2016-12-26 DIAGNOSIS — D631 Anemia in chronic kidney disease: Secondary | ICD-10-CM | POA: Diagnosis not present

## 2016-12-27 DIAGNOSIS — N186 End stage renal disease: Secondary | ICD-10-CM | POA: Diagnosis not present

## 2016-12-27 DIAGNOSIS — N2581 Secondary hyperparathyroidism of renal origin: Secondary | ICD-10-CM | POA: Diagnosis not present

## 2016-12-27 DIAGNOSIS — D631 Anemia in chronic kidney disease: Secondary | ICD-10-CM | POA: Diagnosis not present

## 2016-12-27 DIAGNOSIS — E213 Hyperparathyroidism, unspecified: Secondary | ICD-10-CM | POA: Diagnosis not present

## 2016-12-28 DIAGNOSIS — E213 Hyperparathyroidism, unspecified: Secondary | ICD-10-CM | POA: Diagnosis not present

## 2016-12-28 DIAGNOSIS — N2581 Secondary hyperparathyroidism of renal origin: Secondary | ICD-10-CM | POA: Diagnosis not present

## 2016-12-28 DIAGNOSIS — N186 End stage renal disease: Secondary | ICD-10-CM | POA: Diagnosis not present

## 2016-12-28 DIAGNOSIS — D631 Anemia in chronic kidney disease: Secondary | ICD-10-CM | POA: Diagnosis not present

## 2016-12-29 DIAGNOSIS — N186 End stage renal disease: Secondary | ICD-10-CM | POA: Diagnosis not present

## 2016-12-29 DIAGNOSIS — E213 Hyperparathyroidism, unspecified: Secondary | ICD-10-CM | POA: Diagnosis not present

## 2016-12-29 DIAGNOSIS — D631 Anemia in chronic kidney disease: Secondary | ICD-10-CM | POA: Diagnosis not present

## 2016-12-29 DIAGNOSIS — N2581 Secondary hyperparathyroidism of renal origin: Secondary | ICD-10-CM | POA: Diagnosis not present

## 2016-12-30 DIAGNOSIS — N2581 Secondary hyperparathyroidism of renal origin: Secondary | ICD-10-CM | POA: Diagnosis not present

## 2016-12-30 DIAGNOSIS — D631 Anemia in chronic kidney disease: Secondary | ICD-10-CM | POA: Diagnosis not present

## 2016-12-30 DIAGNOSIS — E213 Hyperparathyroidism, unspecified: Secondary | ICD-10-CM | POA: Diagnosis not present

## 2016-12-30 DIAGNOSIS — N186 End stage renal disease: Secondary | ICD-10-CM | POA: Diagnosis not present

## 2016-12-31 DIAGNOSIS — E213 Hyperparathyroidism, unspecified: Secondary | ICD-10-CM | POA: Diagnosis not present

## 2016-12-31 DIAGNOSIS — D631 Anemia in chronic kidney disease: Secondary | ICD-10-CM | POA: Diagnosis not present

## 2016-12-31 DIAGNOSIS — N186 End stage renal disease: Secondary | ICD-10-CM | POA: Diagnosis not present

## 2016-12-31 DIAGNOSIS — N2581 Secondary hyperparathyroidism of renal origin: Secondary | ICD-10-CM | POA: Diagnosis not present

## 2017-01-01 DIAGNOSIS — D631 Anemia in chronic kidney disease: Secondary | ICD-10-CM | POA: Diagnosis not present

## 2017-01-01 DIAGNOSIS — N186 End stage renal disease: Secondary | ICD-10-CM | POA: Diagnosis not present

## 2017-01-01 DIAGNOSIS — N2581 Secondary hyperparathyroidism of renal origin: Secondary | ICD-10-CM | POA: Diagnosis not present

## 2017-01-01 DIAGNOSIS — E213 Hyperparathyroidism, unspecified: Secondary | ICD-10-CM | POA: Diagnosis not present

## 2017-01-02 DIAGNOSIS — E213 Hyperparathyroidism, unspecified: Secondary | ICD-10-CM | POA: Diagnosis not present

## 2017-01-02 DIAGNOSIS — N2581 Secondary hyperparathyroidism of renal origin: Secondary | ICD-10-CM | POA: Diagnosis not present

## 2017-01-02 DIAGNOSIS — D631 Anemia in chronic kidney disease: Secondary | ICD-10-CM | POA: Diagnosis not present

## 2017-01-02 DIAGNOSIS — N186 End stage renal disease: Secondary | ICD-10-CM | POA: Diagnosis not present

## 2017-01-03 DIAGNOSIS — N186 End stage renal disease: Secondary | ICD-10-CM | POA: Diagnosis not present

## 2017-01-03 DIAGNOSIS — E213 Hyperparathyroidism, unspecified: Secondary | ICD-10-CM | POA: Diagnosis not present

## 2017-01-03 DIAGNOSIS — D631 Anemia in chronic kidney disease: Secondary | ICD-10-CM | POA: Diagnosis not present

## 2017-01-03 DIAGNOSIS — N2581 Secondary hyperparathyroidism of renal origin: Secondary | ICD-10-CM | POA: Diagnosis not present

## 2017-01-04 DIAGNOSIS — N2581 Secondary hyperparathyroidism of renal origin: Secondary | ICD-10-CM | POA: Diagnosis not present

## 2017-01-04 DIAGNOSIS — N186 End stage renal disease: Secondary | ICD-10-CM | POA: Diagnosis not present

## 2017-01-04 DIAGNOSIS — D631 Anemia in chronic kidney disease: Secondary | ICD-10-CM | POA: Diagnosis not present

## 2017-01-04 DIAGNOSIS — E213 Hyperparathyroidism, unspecified: Secondary | ICD-10-CM | POA: Diagnosis not present

## 2017-01-05 DIAGNOSIS — N2581 Secondary hyperparathyroidism of renal origin: Secondary | ICD-10-CM | POA: Diagnosis not present

## 2017-01-05 DIAGNOSIS — E213 Hyperparathyroidism, unspecified: Secondary | ICD-10-CM | POA: Diagnosis not present

## 2017-01-05 DIAGNOSIS — D631 Anemia in chronic kidney disease: Secondary | ICD-10-CM | POA: Diagnosis not present

## 2017-01-05 DIAGNOSIS — N186 End stage renal disease: Secondary | ICD-10-CM | POA: Diagnosis not present

## 2017-01-06 DIAGNOSIS — D631 Anemia in chronic kidney disease: Secondary | ICD-10-CM | POA: Diagnosis not present

## 2017-01-06 DIAGNOSIS — N186 End stage renal disease: Secondary | ICD-10-CM | POA: Diagnosis not present

## 2017-01-06 DIAGNOSIS — N2581 Secondary hyperparathyroidism of renal origin: Secondary | ICD-10-CM | POA: Diagnosis not present

## 2017-01-06 DIAGNOSIS — E213 Hyperparathyroidism, unspecified: Secondary | ICD-10-CM | POA: Diagnosis not present

## 2017-01-07 DIAGNOSIS — N2581 Secondary hyperparathyroidism of renal origin: Secondary | ICD-10-CM | POA: Diagnosis not present

## 2017-01-07 DIAGNOSIS — D631 Anemia in chronic kidney disease: Secondary | ICD-10-CM | POA: Diagnosis not present

## 2017-01-07 DIAGNOSIS — N186 End stage renal disease: Secondary | ICD-10-CM | POA: Diagnosis not present

## 2017-01-07 DIAGNOSIS — E213 Hyperparathyroidism, unspecified: Secondary | ICD-10-CM | POA: Diagnosis not present

## 2017-01-08 DIAGNOSIS — N2581 Secondary hyperparathyroidism of renal origin: Secondary | ICD-10-CM | POA: Diagnosis not present

## 2017-01-08 DIAGNOSIS — N186 End stage renal disease: Secondary | ICD-10-CM | POA: Diagnosis not present

## 2017-01-08 DIAGNOSIS — D631 Anemia in chronic kidney disease: Secondary | ICD-10-CM | POA: Diagnosis not present

## 2017-01-08 DIAGNOSIS — E213 Hyperparathyroidism, unspecified: Secondary | ICD-10-CM | POA: Diagnosis not present

## 2017-01-09 DIAGNOSIS — N2581 Secondary hyperparathyroidism of renal origin: Secondary | ICD-10-CM | POA: Diagnosis not present

## 2017-01-09 DIAGNOSIS — E213 Hyperparathyroidism, unspecified: Secondary | ICD-10-CM | POA: Diagnosis not present

## 2017-01-09 DIAGNOSIS — N186 End stage renal disease: Secondary | ICD-10-CM | POA: Diagnosis not present

## 2017-01-09 DIAGNOSIS — D631 Anemia in chronic kidney disease: Secondary | ICD-10-CM | POA: Diagnosis not present

## 2017-01-10 DIAGNOSIS — E213 Hyperparathyroidism, unspecified: Secondary | ICD-10-CM | POA: Diagnosis not present

## 2017-01-10 DIAGNOSIS — N2581 Secondary hyperparathyroidism of renal origin: Secondary | ICD-10-CM | POA: Diagnosis not present

## 2017-01-10 DIAGNOSIS — N186 End stage renal disease: Secondary | ICD-10-CM | POA: Diagnosis not present

## 2017-01-10 DIAGNOSIS — D631 Anemia in chronic kidney disease: Secondary | ICD-10-CM | POA: Diagnosis not present

## 2017-01-11 DIAGNOSIS — N2581 Secondary hyperparathyroidism of renal origin: Secondary | ICD-10-CM | POA: Diagnosis not present

## 2017-01-11 DIAGNOSIS — E213 Hyperparathyroidism, unspecified: Secondary | ICD-10-CM | POA: Diagnosis not present

## 2017-01-11 DIAGNOSIS — D631 Anemia in chronic kidney disease: Secondary | ICD-10-CM | POA: Diagnosis not present

## 2017-01-11 DIAGNOSIS — N186 End stage renal disease: Secondary | ICD-10-CM | POA: Diagnosis not present

## 2017-01-12 DIAGNOSIS — N186 End stage renal disease: Secondary | ICD-10-CM | POA: Diagnosis not present

## 2017-01-12 DIAGNOSIS — E213 Hyperparathyroidism, unspecified: Secondary | ICD-10-CM | POA: Diagnosis not present

## 2017-01-12 DIAGNOSIS — F29 Unspecified psychosis not due to a substance or known physiological condition: Secondary | ICD-10-CM | POA: Diagnosis not present

## 2017-01-12 DIAGNOSIS — D631 Anemia in chronic kidney disease: Secondary | ICD-10-CM | POA: Diagnosis not present

## 2017-01-12 DIAGNOSIS — N2581 Secondary hyperparathyroidism of renal origin: Secondary | ICD-10-CM | POA: Diagnosis not present

## 2017-01-13 DIAGNOSIS — E213 Hyperparathyroidism, unspecified: Secondary | ICD-10-CM | POA: Diagnosis not present

## 2017-01-13 DIAGNOSIS — N2581 Secondary hyperparathyroidism of renal origin: Secondary | ICD-10-CM | POA: Diagnosis not present

## 2017-01-13 DIAGNOSIS — D631 Anemia in chronic kidney disease: Secondary | ICD-10-CM | POA: Diagnosis not present

## 2017-01-13 DIAGNOSIS — N186 End stage renal disease: Secondary | ICD-10-CM | POA: Diagnosis not present

## 2017-01-14 DIAGNOSIS — N2581 Secondary hyperparathyroidism of renal origin: Secondary | ICD-10-CM | POA: Diagnosis not present

## 2017-01-14 DIAGNOSIS — N186 End stage renal disease: Secondary | ICD-10-CM | POA: Diagnosis not present

## 2017-01-14 DIAGNOSIS — D631 Anemia in chronic kidney disease: Secondary | ICD-10-CM | POA: Diagnosis not present

## 2017-01-14 DIAGNOSIS — E213 Hyperparathyroidism, unspecified: Secondary | ICD-10-CM | POA: Diagnosis not present

## 2017-01-15 DIAGNOSIS — N186 End stage renal disease: Secondary | ICD-10-CM | POA: Diagnosis not present

## 2017-01-15 DIAGNOSIS — N2581 Secondary hyperparathyroidism of renal origin: Secondary | ICD-10-CM | POA: Diagnosis not present

## 2017-01-15 DIAGNOSIS — D631 Anemia in chronic kidney disease: Secondary | ICD-10-CM | POA: Diagnosis not present

## 2017-01-15 DIAGNOSIS — E213 Hyperparathyroidism, unspecified: Secondary | ICD-10-CM | POA: Diagnosis not present

## 2017-01-16 DIAGNOSIS — N2581 Secondary hyperparathyroidism of renal origin: Secondary | ICD-10-CM | POA: Diagnosis not present

## 2017-01-16 DIAGNOSIS — N186 End stage renal disease: Secondary | ICD-10-CM | POA: Diagnosis not present

## 2017-01-16 DIAGNOSIS — D631 Anemia in chronic kidney disease: Secondary | ICD-10-CM | POA: Diagnosis not present

## 2017-01-16 DIAGNOSIS — E213 Hyperparathyroidism, unspecified: Secondary | ICD-10-CM | POA: Diagnosis not present

## 2017-01-17 DIAGNOSIS — N2581 Secondary hyperparathyroidism of renal origin: Secondary | ICD-10-CM | POA: Diagnosis not present

## 2017-01-17 DIAGNOSIS — N186 End stage renal disease: Secondary | ICD-10-CM | POA: Diagnosis not present

## 2017-01-17 DIAGNOSIS — D631 Anemia in chronic kidney disease: Secondary | ICD-10-CM | POA: Diagnosis not present

## 2017-01-17 DIAGNOSIS — E213 Hyperparathyroidism, unspecified: Secondary | ICD-10-CM | POA: Diagnosis not present

## 2017-01-18 DIAGNOSIS — N186 End stage renal disease: Secondary | ICD-10-CM | POA: Diagnosis not present

## 2017-01-18 DIAGNOSIS — E213 Hyperparathyroidism, unspecified: Secondary | ICD-10-CM | POA: Diagnosis not present

## 2017-01-18 DIAGNOSIS — D631 Anemia in chronic kidney disease: Secondary | ICD-10-CM | POA: Diagnosis not present

## 2017-01-18 DIAGNOSIS — N2581 Secondary hyperparathyroidism of renal origin: Secondary | ICD-10-CM | POA: Diagnosis not present

## 2017-01-19 DIAGNOSIS — D631 Anemia in chronic kidney disease: Secondary | ICD-10-CM | POA: Diagnosis not present

## 2017-01-19 DIAGNOSIS — N186 End stage renal disease: Secondary | ICD-10-CM | POA: Diagnosis not present

## 2017-01-19 DIAGNOSIS — N2581 Secondary hyperparathyroidism of renal origin: Secondary | ICD-10-CM | POA: Diagnosis not present

## 2017-01-19 DIAGNOSIS — E213 Hyperparathyroidism, unspecified: Secondary | ICD-10-CM | POA: Diagnosis not present

## 2017-01-20 DIAGNOSIS — D631 Anemia in chronic kidney disease: Secondary | ICD-10-CM | POA: Diagnosis not present

## 2017-01-20 DIAGNOSIS — Z992 Dependence on renal dialysis: Secondary | ICD-10-CM | POA: Diagnosis not present

## 2017-01-20 DIAGNOSIS — E213 Hyperparathyroidism, unspecified: Secondary | ICD-10-CM | POA: Diagnosis not present

## 2017-01-20 DIAGNOSIS — N2581 Secondary hyperparathyroidism of renal origin: Secondary | ICD-10-CM | POA: Diagnosis not present

## 2017-01-20 DIAGNOSIS — N186 End stage renal disease: Secondary | ICD-10-CM | POA: Diagnosis not present

## 2017-01-21 DIAGNOSIS — N2581 Secondary hyperparathyroidism of renal origin: Secondary | ICD-10-CM | POA: Diagnosis not present

## 2017-01-21 DIAGNOSIS — D631 Anemia in chronic kidney disease: Secondary | ICD-10-CM | POA: Diagnosis not present

## 2017-01-21 DIAGNOSIS — N186 End stage renal disease: Secondary | ICD-10-CM | POA: Diagnosis not present

## 2017-01-22 DIAGNOSIS — N2581 Secondary hyperparathyroidism of renal origin: Secondary | ICD-10-CM | POA: Diagnosis not present

## 2017-01-22 DIAGNOSIS — N186 End stage renal disease: Secondary | ICD-10-CM | POA: Diagnosis not present

## 2017-01-22 DIAGNOSIS — D631 Anemia in chronic kidney disease: Secondary | ICD-10-CM | POA: Diagnosis not present

## 2017-01-23 DIAGNOSIS — Z114 Encounter for screening for human immunodeficiency virus [HIV]: Secondary | ICD-10-CM | POA: Diagnosis not present

## 2017-01-23 DIAGNOSIS — N2581 Secondary hyperparathyroidism of renal origin: Secondary | ICD-10-CM | POA: Diagnosis not present

## 2017-01-23 DIAGNOSIS — N186 End stage renal disease: Secondary | ICD-10-CM | POA: Diagnosis not present

## 2017-01-23 DIAGNOSIS — D631 Anemia in chronic kidney disease: Secondary | ICD-10-CM | POA: Diagnosis not present

## 2017-01-23 DIAGNOSIS — Z1159 Encounter for screening for other viral diseases: Secondary | ICD-10-CM | POA: Diagnosis not present

## 2017-01-24 DIAGNOSIS — D631 Anemia in chronic kidney disease: Secondary | ICD-10-CM | POA: Diagnosis not present

## 2017-01-24 DIAGNOSIS — N186 End stage renal disease: Secondary | ICD-10-CM | POA: Diagnosis not present

## 2017-01-24 DIAGNOSIS — N2581 Secondary hyperparathyroidism of renal origin: Secondary | ICD-10-CM | POA: Diagnosis not present

## 2017-01-25 DIAGNOSIS — N186 End stage renal disease: Secondary | ICD-10-CM | POA: Diagnosis not present

## 2017-01-25 DIAGNOSIS — D631 Anemia in chronic kidney disease: Secondary | ICD-10-CM | POA: Diagnosis not present

## 2017-01-25 DIAGNOSIS — N2581 Secondary hyperparathyroidism of renal origin: Secondary | ICD-10-CM | POA: Diagnosis not present

## 2017-01-26 DIAGNOSIS — N186 End stage renal disease: Secondary | ICD-10-CM | POA: Diagnosis not present

## 2017-01-26 DIAGNOSIS — D631 Anemia in chronic kidney disease: Secondary | ICD-10-CM | POA: Diagnosis not present

## 2017-01-26 DIAGNOSIS — N2581 Secondary hyperparathyroidism of renal origin: Secondary | ICD-10-CM | POA: Diagnosis not present

## 2017-01-27 DIAGNOSIS — D631 Anemia in chronic kidney disease: Secondary | ICD-10-CM | POA: Diagnosis not present

## 2017-01-27 DIAGNOSIS — N186 End stage renal disease: Secondary | ICD-10-CM | POA: Diagnosis not present

## 2017-01-27 DIAGNOSIS — N2581 Secondary hyperparathyroidism of renal origin: Secondary | ICD-10-CM | POA: Diagnosis not present

## 2017-01-28 DIAGNOSIS — N2581 Secondary hyperparathyroidism of renal origin: Secondary | ICD-10-CM | POA: Diagnosis not present

## 2017-01-28 DIAGNOSIS — D631 Anemia in chronic kidney disease: Secondary | ICD-10-CM | POA: Diagnosis not present

## 2017-01-28 DIAGNOSIS — N186 End stage renal disease: Secondary | ICD-10-CM | POA: Diagnosis not present

## 2017-01-29 DIAGNOSIS — N186 End stage renal disease: Secondary | ICD-10-CM | POA: Diagnosis not present

## 2017-01-29 DIAGNOSIS — N2581 Secondary hyperparathyroidism of renal origin: Secondary | ICD-10-CM | POA: Diagnosis not present

## 2017-01-29 DIAGNOSIS — D631 Anemia in chronic kidney disease: Secondary | ICD-10-CM | POA: Diagnosis not present

## 2017-01-30 DIAGNOSIS — N186 End stage renal disease: Secondary | ICD-10-CM | POA: Diagnosis not present

## 2017-01-30 DIAGNOSIS — D631 Anemia in chronic kidney disease: Secondary | ICD-10-CM | POA: Diagnosis not present

## 2017-01-30 DIAGNOSIS — N2581 Secondary hyperparathyroidism of renal origin: Secondary | ICD-10-CM | POA: Diagnosis not present

## 2017-01-31 DIAGNOSIS — N2581 Secondary hyperparathyroidism of renal origin: Secondary | ICD-10-CM | POA: Diagnosis not present

## 2017-01-31 DIAGNOSIS — D631 Anemia in chronic kidney disease: Secondary | ICD-10-CM | POA: Diagnosis not present

## 2017-01-31 DIAGNOSIS — N186 End stage renal disease: Secondary | ICD-10-CM | POA: Diagnosis not present

## 2017-02-01 DIAGNOSIS — N2581 Secondary hyperparathyroidism of renal origin: Secondary | ICD-10-CM | POA: Diagnosis not present

## 2017-02-01 DIAGNOSIS — D631 Anemia in chronic kidney disease: Secondary | ICD-10-CM | POA: Diagnosis not present

## 2017-02-01 DIAGNOSIS — N186 End stage renal disease: Secondary | ICD-10-CM | POA: Diagnosis not present

## 2017-02-02 DIAGNOSIS — N186 End stage renal disease: Secondary | ICD-10-CM | POA: Diagnosis not present

## 2017-02-02 DIAGNOSIS — I10 Essential (primary) hypertension: Secondary | ICD-10-CM | POA: Diagnosis not present

## 2017-02-02 DIAGNOSIS — N2581 Secondary hyperparathyroidism of renal origin: Secondary | ICD-10-CM | POA: Diagnosis not present

## 2017-02-02 DIAGNOSIS — D539 Nutritional anemia, unspecified: Secondary | ICD-10-CM | POA: Diagnosis not present

## 2017-02-02 DIAGNOSIS — E785 Hyperlipidemia, unspecified: Secondary | ICD-10-CM | POA: Diagnosis not present

## 2017-02-02 DIAGNOSIS — I119 Hypertensive heart disease without heart failure: Secondary | ICD-10-CM | POA: Diagnosis not present

## 2017-02-02 DIAGNOSIS — E039 Hypothyroidism, unspecified: Secondary | ICD-10-CM | POA: Diagnosis not present

## 2017-02-02 DIAGNOSIS — D631 Anemia in chronic kidney disease: Secondary | ICD-10-CM | POA: Diagnosis not present

## 2017-02-02 DIAGNOSIS — Z30019 Encounter for initial prescription of contraceptives, unspecified: Secondary | ICD-10-CM | POA: Diagnosis not present

## 2017-02-02 DIAGNOSIS — R569 Unspecified convulsions: Secondary | ICD-10-CM | POA: Diagnosis not present

## 2017-02-03 DIAGNOSIS — N2581 Secondary hyperparathyroidism of renal origin: Secondary | ICD-10-CM | POA: Diagnosis not present

## 2017-02-03 DIAGNOSIS — N186 End stage renal disease: Secondary | ICD-10-CM | POA: Diagnosis not present

## 2017-02-03 DIAGNOSIS — D631 Anemia in chronic kidney disease: Secondary | ICD-10-CM | POA: Diagnosis not present

## 2017-02-04 DIAGNOSIS — N2581 Secondary hyperparathyroidism of renal origin: Secondary | ICD-10-CM | POA: Diagnosis not present

## 2017-02-04 DIAGNOSIS — N186 End stage renal disease: Secondary | ICD-10-CM | POA: Diagnosis not present

## 2017-02-04 DIAGNOSIS — D631 Anemia in chronic kidney disease: Secondary | ICD-10-CM | POA: Diagnosis not present

## 2017-02-05 DIAGNOSIS — D631 Anemia in chronic kidney disease: Secondary | ICD-10-CM | POA: Diagnosis not present

## 2017-02-05 DIAGNOSIS — N186 End stage renal disease: Secondary | ICD-10-CM | POA: Diagnosis not present

## 2017-02-05 DIAGNOSIS — N2581 Secondary hyperparathyroidism of renal origin: Secondary | ICD-10-CM | POA: Diagnosis not present

## 2017-02-06 DIAGNOSIS — N2581 Secondary hyperparathyroidism of renal origin: Secondary | ICD-10-CM | POA: Diagnosis not present

## 2017-02-06 DIAGNOSIS — D631 Anemia in chronic kidney disease: Secondary | ICD-10-CM | POA: Diagnosis not present

## 2017-02-06 DIAGNOSIS — N186 End stage renal disease: Secondary | ICD-10-CM | POA: Diagnosis not present

## 2017-02-07 DIAGNOSIS — D631 Anemia in chronic kidney disease: Secondary | ICD-10-CM | POA: Diagnosis not present

## 2017-02-07 DIAGNOSIS — N2581 Secondary hyperparathyroidism of renal origin: Secondary | ICD-10-CM | POA: Diagnosis not present

## 2017-02-07 DIAGNOSIS — N186 End stage renal disease: Secondary | ICD-10-CM | POA: Diagnosis not present

## 2017-02-08 DIAGNOSIS — D631 Anemia in chronic kidney disease: Secondary | ICD-10-CM | POA: Diagnosis not present

## 2017-02-08 DIAGNOSIS — N186 End stage renal disease: Secondary | ICD-10-CM | POA: Diagnosis not present

## 2017-02-08 DIAGNOSIS — N2581 Secondary hyperparathyroidism of renal origin: Secondary | ICD-10-CM | POA: Diagnosis not present

## 2017-02-09 DIAGNOSIS — F29 Unspecified psychosis not due to a substance or known physiological condition: Secondary | ICD-10-CM | POA: Diagnosis not present

## 2017-02-09 DIAGNOSIS — Z79899 Other long term (current) drug therapy: Secondary | ICD-10-CM | POA: Diagnosis not present

## 2017-02-09 DIAGNOSIS — D631 Anemia in chronic kidney disease: Secondary | ICD-10-CM | POA: Diagnosis not present

## 2017-02-09 DIAGNOSIS — N2581 Secondary hyperparathyroidism of renal origin: Secondary | ICD-10-CM | POA: Diagnosis not present

## 2017-02-09 DIAGNOSIS — N186 End stage renal disease: Secondary | ICD-10-CM | POA: Diagnosis not present

## 2017-02-10 DIAGNOSIS — D631 Anemia in chronic kidney disease: Secondary | ICD-10-CM | POA: Diagnosis not present

## 2017-02-10 DIAGNOSIS — N2581 Secondary hyperparathyroidism of renal origin: Secondary | ICD-10-CM | POA: Diagnosis not present

## 2017-02-10 DIAGNOSIS — N186 End stage renal disease: Secondary | ICD-10-CM | POA: Diagnosis not present

## 2017-02-11 DIAGNOSIS — D631 Anemia in chronic kidney disease: Secondary | ICD-10-CM | POA: Diagnosis not present

## 2017-02-11 DIAGNOSIS — N2581 Secondary hyperparathyroidism of renal origin: Secondary | ICD-10-CM | POA: Diagnosis not present

## 2017-02-11 DIAGNOSIS — N186 End stage renal disease: Secondary | ICD-10-CM | POA: Diagnosis not present

## 2017-02-12 DIAGNOSIS — N186 End stage renal disease: Secondary | ICD-10-CM | POA: Diagnosis not present

## 2017-02-12 DIAGNOSIS — N2581 Secondary hyperparathyroidism of renal origin: Secondary | ICD-10-CM | POA: Diagnosis not present

## 2017-02-12 DIAGNOSIS — D631 Anemia in chronic kidney disease: Secondary | ICD-10-CM | POA: Diagnosis not present

## 2017-02-13 DIAGNOSIS — N2581 Secondary hyperparathyroidism of renal origin: Secondary | ICD-10-CM | POA: Diagnosis not present

## 2017-02-13 DIAGNOSIS — D631 Anemia in chronic kidney disease: Secondary | ICD-10-CM | POA: Diagnosis not present

## 2017-02-13 DIAGNOSIS — N186 End stage renal disease: Secondary | ICD-10-CM | POA: Diagnosis not present

## 2017-02-14 DIAGNOSIS — D631 Anemia in chronic kidney disease: Secondary | ICD-10-CM | POA: Diagnosis not present

## 2017-02-14 DIAGNOSIS — N186 End stage renal disease: Secondary | ICD-10-CM | POA: Diagnosis not present

## 2017-02-14 DIAGNOSIS — N2581 Secondary hyperparathyroidism of renal origin: Secondary | ICD-10-CM | POA: Diagnosis not present

## 2017-02-15 DIAGNOSIS — N186 End stage renal disease: Secondary | ICD-10-CM | POA: Diagnosis not present

## 2017-02-15 DIAGNOSIS — D631 Anemia in chronic kidney disease: Secondary | ICD-10-CM | POA: Diagnosis not present

## 2017-02-15 DIAGNOSIS — N2581 Secondary hyperparathyroidism of renal origin: Secondary | ICD-10-CM | POA: Diagnosis not present

## 2017-02-16 DIAGNOSIS — D631 Anemia in chronic kidney disease: Secondary | ICD-10-CM | POA: Diagnosis not present

## 2017-02-16 DIAGNOSIS — N186 End stage renal disease: Secondary | ICD-10-CM | POA: Diagnosis not present

## 2017-02-16 DIAGNOSIS — N2581 Secondary hyperparathyroidism of renal origin: Secondary | ICD-10-CM | POA: Diagnosis not present

## 2017-02-17 DIAGNOSIS — N2581 Secondary hyperparathyroidism of renal origin: Secondary | ICD-10-CM | POA: Diagnosis not present

## 2017-02-17 DIAGNOSIS — D631 Anemia in chronic kidney disease: Secondary | ICD-10-CM | POA: Diagnosis not present

## 2017-02-17 DIAGNOSIS — N186 End stage renal disease: Secondary | ICD-10-CM | POA: Diagnosis not present

## 2017-02-18 DIAGNOSIS — D631 Anemia in chronic kidney disease: Secondary | ICD-10-CM | POA: Diagnosis not present

## 2017-02-18 DIAGNOSIS — N2581 Secondary hyperparathyroidism of renal origin: Secondary | ICD-10-CM | POA: Diagnosis not present

## 2017-02-18 DIAGNOSIS — N186 End stage renal disease: Secondary | ICD-10-CM | POA: Diagnosis not present

## 2017-02-19 DIAGNOSIS — N2581 Secondary hyperparathyroidism of renal origin: Secondary | ICD-10-CM | POA: Diagnosis not present

## 2017-02-19 DIAGNOSIS — Z992 Dependence on renal dialysis: Secondary | ICD-10-CM | POA: Diagnosis not present

## 2017-02-19 DIAGNOSIS — D631 Anemia in chronic kidney disease: Secondary | ICD-10-CM | POA: Diagnosis not present

## 2017-02-19 DIAGNOSIS — N186 End stage renal disease: Secondary | ICD-10-CM | POA: Diagnosis not present

## 2017-02-20 DIAGNOSIS — N186 End stage renal disease: Secondary | ICD-10-CM | POA: Diagnosis not present

## 2017-02-20 DIAGNOSIS — D631 Anemia in chronic kidney disease: Secondary | ICD-10-CM | POA: Diagnosis not present

## 2017-02-20 DIAGNOSIS — N2581 Secondary hyperparathyroidism of renal origin: Secondary | ICD-10-CM | POA: Diagnosis not present

## 2017-02-21 DIAGNOSIS — N186 End stage renal disease: Secondary | ICD-10-CM | POA: Diagnosis not present

## 2017-02-21 DIAGNOSIS — N2581 Secondary hyperparathyroidism of renal origin: Secondary | ICD-10-CM | POA: Diagnosis not present

## 2017-02-21 DIAGNOSIS — D631 Anemia in chronic kidney disease: Secondary | ICD-10-CM | POA: Diagnosis not present

## 2017-02-22 DIAGNOSIS — N2581 Secondary hyperparathyroidism of renal origin: Secondary | ICD-10-CM | POA: Diagnosis not present

## 2017-02-22 DIAGNOSIS — D631 Anemia in chronic kidney disease: Secondary | ICD-10-CM | POA: Diagnosis not present

## 2017-02-22 DIAGNOSIS — N186 End stage renal disease: Secondary | ICD-10-CM | POA: Diagnosis not present

## 2017-02-23 DIAGNOSIS — D631 Anemia in chronic kidney disease: Secondary | ICD-10-CM | POA: Diagnosis not present

## 2017-02-23 DIAGNOSIS — N2581 Secondary hyperparathyroidism of renal origin: Secondary | ICD-10-CM | POA: Diagnosis not present

## 2017-02-23 DIAGNOSIS — N186 End stage renal disease: Secondary | ICD-10-CM | POA: Diagnosis not present

## 2017-02-24 DIAGNOSIS — N186 End stage renal disease: Secondary | ICD-10-CM | POA: Diagnosis not present

## 2017-02-24 DIAGNOSIS — D631 Anemia in chronic kidney disease: Secondary | ICD-10-CM | POA: Diagnosis not present

## 2017-02-24 DIAGNOSIS — N2581 Secondary hyperparathyroidism of renal origin: Secondary | ICD-10-CM | POA: Diagnosis not present

## 2017-02-25 DIAGNOSIS — D631 Anemia in chronic kidney disease: Secondary | ICD-10-CM | POA: Diagnosis not present

## 2017-02-25 DIAGNOSIS — N186 End stage renal disease: Secondary | ICD-10-CM | POA: Diagnosis not present

## 2017-02-25 DIAGNOSIS — N2581 Secondary hyperparathyroidism of renal origin: Secondary | ICD-10-CM | POA: Diagnosis not present

## 2017-02-26 DIAGNOSIS — D631 Anemia in chronic kidney disease: Secondary | ICD-10-CM | POA: Diagnosis not present

## 2017-02-26 DIAGNOSIS — N2581 Secondary hyperparathyroidism of renal origin: Secondary | ICD-10-CM | POA: Diagnosis not present

## 2017-02-26 DIAGNOSIS — N186 End stage renal disease: Secondary | ICD-10-CM | POA: Diagnosis not present

## 2017-02-27 DIAGNOSIS — N2581 Secondary hyperparathyroidism of renal origin: Secondary | ICD-10-CM | POA: Diagnosis not present

## 2017-02-27 DIAGNOSIS — D631 Anemia in chronic kidney disease: Secondary | ICD-10-CM | POA: Diagnosis not present

## 2017-02-27 DIAGNOSIS — N186 End stage renal disease: Secondary | ICD-10-CM | POA: Diagnosis not present

## 2017-02-28 DIAGNOSIS — N2581 Secondary hyperparathyroidism of renal origin: Secondary | ICD-10-CM | POA: Diagnosis not present

## 2017-02-28 DIAGNOSIS — N186 End stage renal disease: Secondary | ICD-10-CM | POA: Diagnosis not present

## 2017-02-28 DIAGNOSIS — D631 Anemia in chronic kidney disease: Secondary | ICD-10-CM | POA: Diagnosis not present

## 2017-03-01 DIAGNOSIS — D631 Anemia in chronic kidney disease: Secondary | ICD-10-CM | POA: Diagnosis not present

## 2017-03-01 DIAGNOSIS — N2581 Secondary hyperparathyroidism of renal origin: Secondary | ICD-10-CM | POA: Diagnosis not present

## 2017-03-01 DIAGNOSIS — N186 End stage renal disease: Secondary | ICD-10-CM | POA: Diagnosis not present

## 2017-03-02 DIAGNOSIS — N2581 Secondary hyperparathyroidism of renal origin: Secondary | ICD-10-CM | POA: Diagnosis not present

## 2017-03-02 DIAGNOSIS — N186 End stage renal disease: Secondary | ICD-10-CM | POA: Diagnosis not present

## 2017-03-02 DIAGNOSIS — D631 Anemia in chronic kidney disease: Secondary | ICD-10-CM | POA: Diagnosis not present

## 2017-03-03 DIAGNOSIS — N186 End stage renal disease: Secondary | ICD-10-CM | POA: Diagnosis not present

## 2017-03-03 DIAGNOSIS — N2581 Secondary hyperparathyroidism of renal origin: Secondary | ICD-10-CM | POA: Diagnosis not present

## 2017-03-03 DIAGNOSIS — D631 Anemia in chronic kidney disease: Secondary | ICD-10-CM | POA: Diagnosis not present

## 2017-03-04 DIAGNOSIS — N186 End stage renal disease: Secondary | ICD-10-CM | POA: Diagnosis not present

## 2017-03-04 DIAGNOSIS — N2581 Secondary hyperparathyroidism of renal origin: Secondary | ICD-10-CM | POA: Diagnosis not present

## 2017-03-04 DIAGNOSIS — D631 Anemia in chronic kidney disease: Secondary | ICD-10-CM | POA: Diagnosis not present

## 2017-03-05 DIAGNOSIS — N186 End stage renal disease: Secondary | ICD-10-CM | POA: Diagnosis not present

## 2017-03-05 DIAGNOSIS — D631 Anemia in chronic kidney disease: Secondary | ICD-10-CM | POA: Diagnosis not present

## 2017-03-05 DIAGNOSIS — N2581 Secondary hyperparathyroidism of renal origin: Secondary | ICD-10-CM | POA: Diagnosis not present

## 2017-03-06 DIAGNOSIS — N186 End stage renal disease: Secondary | ICD-10-CM | POA: Diagnosis not present

## 2017-03-06 DIAGNOSIS — D631 Anemia in chronic kidney disease: Secondary | ICD-10-CM | POA: Diagnosis not present

## 2017-03-06 DIAGNOSIS — N2581 Secondary hyperparathyroidism of renal origin: Secondary | ICD-10-CM | POA: Diagnosis not present

## 2017-03-07 DIAGNOSIS — N2581 Secondary hyperparathyroidism of renal origin: Secondary | ICD-10-CM | POA: Diagnosis not present

## 2017-03-07 DIAGNOSIS — D631 Anemia in chronic kidney disease: Secondary | ICD-10-CM | POA: Diagnosis not present

## 2017-03-07 DIAGNOSIS — N186 End stage renal disease: Secondary | ICD-10-CM | POA: Diagnosis not present

## 2017-03-08 DIAGNOSIS — D631 Anemia in chronic kidney disease: Secondary | ICD-10-CM | POA: Diagnosis not present

## 2017-03-08 DIAGNOSIS — N2581 Secondary hyperparathyroidism of renal origin: Secondary | ICD-10-CM | POA: Diagnosis not present

## 2017-03-08 DIAGNOSIS — N186 End stage renal disease: Secondary | ICD-10-CM | POA: Diagnosis not present

## 2017-03-08 DIAGNOSIS — F29 Unspecified psychosis not due to a substance or known physiological condition: Secondary | ICD-10-CM | POA: Diagnosis not present

## 2017-03-09 DIAGNOSIS — N186 End stage renal disease: Secondary | ICD-10-CM | POA: Diagnosis not present

## 2017-03-09 DIAGNOSIS — N2581 Secondary hyperparathyroidism of renal origin: Secondary | ICD-10-CM | POA: Diagnosis not present

## 2017-03-09 DIAGNOSIS — D631 Anemia in chronic kidney disease: Secondary | ICD-10-CM | POA: Diagnosis not present

## 2017-03-10 DIAGNOSIS — N2581 Secondary hyperparathyroidism of renal origin: Secondary | ICD-10-CM | POA: Diagnosis not present

## 2017-03-10 DIAGNOSIS — D631 Anemia in chronic kidney disease: Secondary | ICD-10-CM | POA: Diagnosis not present

## 2017-03-10 DIAGNOSIS — N186 End stage renal disease: Secondary | ICD-10-CM | POA: Diagnosis not present

## 2017-03-11 DIAGNOSIS — N2581 Secondary hyperparathyroidism of renal origin: Secondary | ICD-10-CM | POA: Diagnosis not present

## 2017-03-11 DIAGNOSIS — N186 End stage renal disease: Secondary | ICD-10-CM | POA: Diagnosis not present

## 2017-03-11 DIAGNOSIS — D631 Anemia in chronic kidney disease: Secondary | ICD-10-CM | POA: Diagnosis not present

## 2017-03-12 DIAGNOSIS — D631 Anemia in chronic kidney disease: Secondary | ICD-10-CM | POA: Diagnosis not present

## 2017-03-12 DIAGNOSIS — N186 End stage renal disease: Secondary | ICD-10-CM | POA: Diagnosis not present

## 2017-03-12 DIAGNOSIS — N2581 Secondary hyperparathyroidism of renal origin: Secondary | ICD-10-CM | POA: Diagnosis not present

## 2017-03-13 DIAGNOSIS — F79 Unspecified intellectual disabilities: Secondary | ICD-10-CM | POA: Diagnosis not present

## 2017-03-13 DIAGNOSIS — R6250 Unspecified lack of expected normal physiological development in childhood: Secondary | ICD-10-CM | POA: Diagnosis not present

## 2017-03-13 DIAGNOSIS — E039 Hypothyroidism, unspecified: Secondary | ICD-10-CM | POA: Diagnosis not present

## 2017-03-13 DIAGNOSIS — G40909 Epilepsy, unspecified, not intractable, without status epilepticus: Secondary | ICD-10-CM | POA: Diagnosis not present

## 2017-03-13 DIAGNOSIS — Z992 Dependence on renal dialysis: Secondary | ICD-10-CM | POA: Diagnosis not present

## 2017-03-13 DIAGNOSIS — D631 Anemia in chronic kidney disease: Secondary | ICD-10-CM | POA: Diagnosis not present

## 2017-03-13 DIAGNOSIS — I120 Hypertensive chronic kidney disease with stage 5 chronic kidney disease or end stage renal disease: Secondary | ICD-10-CM | POA: Diagnosis not present

## 2017-03-13 DIAGNOSIS — N2889 Other specified disorders of kidney and ureter: Secondary | ICD-10-CM | POA: Diagnosis not present

## 2017-03-13 DIAGNOSIS — G4733 Obstructive sleep apnea (adult) (pediatric): Secondary | ICD-10-CM | POA: Diagnosis not present

## 2017-03-13 DIAGNOSIS — E782 Mixed hyperlipidemia: Secondary | ICD-10-CM | POA: Diagnosis not present

## 2017-03-13 DIAGNOSIS — E790 Hyperuricemia without signs of inflammatory arthritis and tophaceous disease: Secondary | ICD-10-CM | POA: Diagnosis not present

## 2017-03-13 DIAGNOSIS — N2581 Secondary hyperparathyroidism of renal origin: Secondary | ICD-10-CM | POA: Diagnosis not present

## 2017-03-13 DIAGNOSIS — Z01818 Encounter for other preprocedural examination: Secondary | ICD-10-CM | POA: Diagnosis not present

## 2017-03-13 DIAGNOSIS — N186 End stage renal disease: Secondary | ICD-10-CM | POA: Diagnosis not present

## 2017-03-14 DIAGNOSIS — N2581 Secondary hyperparathyroidism of renal origin: Secondary | ICD-10-CM | POA: Diagnosis not present

## 2017-03-14 DIAGNOSIS — D631 Anemia in chronic kidney disease: Secondary | ICD-10-CM | POA: Diagnosis not present

## 2017-03-14 DIAGNOSIS — N186 End stage renal disease: Secondary | ICD-10-CM | POA: Diagnosis not present

## 2017-03-15 DIAGNOSIS — D631 Anemia in chronic kidney disease: Secondary | ICD-10-CM | POA: Diagnosis not present

## 2017-03-15 DIAGNOSIS — N2581 Secondary hyperparathyroidism of renal origin: Secondary | ICD-10-CM | POA: Diagnosis not present

## 2017-03-15 DIAGNOSIS — N186 End stage renal disease: Secondary | ICD-10-CM | POA: Diagnosis not present

## 2017-03-16 DIAGNOSIS — D631 Anemia in chronic kidney disease: Secondary | ICD-10-CM | POA: Diagnosis not present

## 2017-03-16 DIAGNOSIS — N2581 Secondary hyperparathyroidism of renal origin: Secondary | ICD-10-CM | POA: Diagnosis not present

## 2017-03-16 DIAGNOSIS — N186 End stage renal disease: Secondary | ICD-10-CM | POA: Diagnosis not present

## 2017-03-17 DIAGNOSIS — D631 Anemia in chronic kidney disease: Secondary | ICD-10-CM | POA: Diagnosis not present

## 2017-03-17 DIAGNOSIS — N186 End stage renal disease: Secondary | ICD-10-CM | POA: Diagnosis not present

## 2017-03-17 DIAGNOSIS — N2581 Secondary hyperparathyroidism of renal origin: Secondary | ICD-10-CM | POA: Diagnosis not present

## 2017-03-18 DIAGNOSIS — N186 End stage renal disease: Secondary | ICD-10-CM | POA: Diagnosis not present

## 2017-03-18 DIAGNOSIS — N2581 Secondary hyperparathyroidism of renal origin: Secondary | ICD-10-CM | POA: Diagnosis not present

## 2017-03-18 DIAGNOSIS — D631 Anemia in chronic kidney disease: Secondary | ICD-10-CM | POA: Diagnosis not present

## 2017-03-19 DIAGNOSIS — D631 Anemia in chronic kidney disease: Secondary | ICD-10-CM | POA: Diagnosis not present

## 2017-03-19 DIAGNOSIS — N186 End stage renal disease: Secondary | ICD-10-CM | POA: Diagnosis not present

## 2017-03-19 DIAGNOSIS — N2581 Secondary hyperparathyroidism of renal origin: Secondary | ICD-10-CM | POA: Diagnosis not present

## 2017-03-20 DIAGNOSIS — N2581 Secondary hyperparathyroidism of renal origin: Secondary | ICD-10-CM | POA: Diagnosis not present

## 2017-03-20 DIAGNOSIS — N186 End stage renal disease: Secondary | ICD-10-CM | POA: Diagnosis not present

## 2017-03-20 DIAGNOSIS — D631 Anemia in chronic kidney disease: Secondary | ICD-10-CM | POA: Diagnosis not present

## 2017-03-21 DIAGNOSIS — N186 End stage renal disease: Secondary | ICD-10-CM | POA: Diagnosis not present

## 2017-03-21 DIAGNOSIS — N2581 Secondary hyperparathyroidism of renal origin: Secondary | ICD-10-CM | POA: Diagnosis not present

## 2017-03-21 DIAGNOSIS — D631 Anemia in chronic kidney disease: Secondary | ICD-10-CM | POA: Diagnosis not present

## 2017-03-22 DIAGNOSIS — N2581 Secondary hyperparathyroidism of renal origin: Secondary | ICD-10-CM | POA: Diagnosis not present

## 2017-03-22 DIAGNOSIS — D631 Anemia in chronic kidney disease: Secondary | ICD-10-CM | POA: Diagnosis not present

## 2017-03-22 DIAGNOSIS — N186 End stage renal disease: Secondary | ICD-10-CM | POA: Diagnosis not present

## 2017-03-22 DIAGNOSIS — Z992 Dependence on renal dialysis: Secondary | ICD-10-CM | POA: Diagnosis not present

## 2017-03-23 DIAGNOSIS — N2581 Secondary hyperparathyroidism of renal origin: Secondary | ICD-10-CM | POA: Diagnosis not present

## 2017-03-23 DIAGNOSIS — D631 Anemia in chronic kidney disease: Secondary | ICD-10-CM | POA: Diagnosis not present

## 2017-03-23 DIAGNOSIS — N186 End stage renal disease: Secondary | ICD-10-CM | POA: Diagnosis not present

## 2017-03-24 DIAGNOSIS — N186 End stage renal disease: Secondary | ICD-10-CM | POA: Diagnosis not present

## 2017-03-24 DIAGNOSIS — N2581 Secondary hyperparathyroidism of renal origin: Secondary | ICD-10-CM | POA: Diagnosis not present

## 2017-03-24 DIAGNOSIS — D631 Anemia in chronic kidney disease: Secondary | ICD-10-CM | POA: Diagnosis not present

## 2017-03-25 DIAGNOSIS — N2581 Secondary hyperparathyroidism of renal origin: Secondary | ICD-10-CM | POA: Diagnosis not present

## 2017-03-25 DIAGNOSIS — D631 Anemia in chronic kidney disease: Secondary | ICD-10-CM | POA: Diagnosis not present

## 2017-03-25 DIAGNOSIS — N186 End stage renal disease: Secondary | ICD-10-CM | POA: Diagnosis not present

## 2017-03-26 DIAGNOSIS — D631 Anemia in chronic kidney disease: Secondary | ICD-10-CM | POA: Diagnosis not present

## 2017-03-26 DIAGNOSIS — N2581 Secondary hyperparathyroidism of renal origin: Secondary | ICD-10-CM | POA: Diagnosis not present

## 2017-03-26 DIAGNOSIS — Z4932 Encounter for adequacy testing for peritoneal dialysis: Secondary | ICD-10-CM | POA: Diagnosis not present

## 2017-03-26 DIAGNOSIS — N186 End stage renal disease: Secondary | ICD-10-CM | POA: Diagnosis not present

## 2017-03-27 DIAGNOSIS — D631 Anemia in chronic kidney disease: Secondary | ICD-10-CM | POA: Diagnosis not present

## 2017-03-27 DIAGNOSIS — N186 End stage renal disease: Secondary | ICD-10-CM | POA: Diagnosis not present

## 2017-03-27 DIAGNOSIS — N2581 Secondary hyperparathyroidism of renal origin: Secondary | ICD-10-CM | POA: Diagnosis not present

## 2017-03-28 DIAGNOSIS — D631 Anemia in chronic kidney disease: Secondary | ICD-10-CM | POA: Diagnosis not present

## 2017-03-28 DIAGNOSIS — N2581 Secondary hyperparathyroidism of renal origin: Secondary | ICD-10-CM | POA: Diagnosis not present

## 2017-03-28 DIAGNOSIS — Z4932 Encounter for adequacy testing for peritoneal dialysis: Secondary | ICD-10-CM | POA: Diagnosis not present

## 2017-03-28 DIAGNOSIS — N186 End stage renal disease: Secondary | ICD-10-CM | POA: Diagnosis not present

## 2017-03-29 DIAGNOSIS — N2581 Secondary hyperparathyroidism of renal origin: Secondary | ICD-10-CM | POA: Diagnosis not present

## 2017-03-29 DIAGNOSIS — N186 End stage renal disease: Secondary | ICD-10-CM | POA: Diagnosis not present

## 2017-03-29 DIAGNOSIS — D631 Anemia in chronic kidney disease: Secondary | ICD-10-CM | POA: Diagnosis not present

## 2017-03-30 DIAGNOSIS — N2581 Secondary hyperparathyroidism of renal origin: Secondary | ICD-10-CM | POA: Diagnosis not present

## 2017-03-30 DIAGNOSIS — N186 End stage renal disease: Secondary | ICD-10-CM | POA: Diagnosis not present

## 2017-03-30 DIAGNOSIS — D631 Anemia in chronic kidney disease: Secondary | ICD-10-CM | POA: Diagnosis not present

## 2017-03-31 DIAGNOSIS — N2581 Secondary hyperparathyroidism of renal origin: Secondary | ICD-10-CM | POA: Diagnosis not present

## 2017-03-31 DIAGNOSIS — D631 Anemia in chronic kidney disease: Secondary | ICD-10-CM | POA: Diagnosis not present

## 2017-03-31 DIAGNOSIS — N186 End stage renal disease: Secondary | ICD-10-CM | POA: Diagnosis not present

## 2017-04-01 DIAGNOSIS — N2581 Secondary hyperparathyroidism of renal origin: Secondary | ICD-10-CM | POA: Diagnosis not present

## 2017-04-01 DIAGNOSIS — D631 Anemia in chronic kidney disease: Secondary | ICD-10-CM | POA: Diagnosis not present

## 2017-04-01 DIAGNOSIS — N186 End stage renal disease: Secondary | ICD-10-CM | POA: Diagnosis not present

## 2017-04-02 DIAGNOSIS — N186 End stage renal disease: Secondary | ICD-10-CM | POA: Diagnosis not present

## 2017-04-02 DIAGNOSIS — N2581 Secondary hyperparathyroidism of renal origin: Secondary | ICD-10-CM | POA: Diagnosis not present

## 2017-04-02 DIAGNOSIS — D631 Anemia in chronic kidney disease: Secondary | ICD-10-CM | POA: Diagnosis not present

## 2017-04-03 DIAGNOSIS — N186 End stage renal disease: Secondary | ICD-10-CM | POA: Diagnosis not present

## 2017-04-03 DIAGNOSIS — D631 Anemia in chronic kidney disease: Secondary | ICD-10-CM | POA: Diagnosis not present

## 2017-04-03 DIAGNOSIS — N2581 Secondary hyperparathyroidism of renal origin: Secondary | ICD-10-CM | POA: Diagnosis not present

## 2017-04-04 DIAGNOSIS — N186 End stage renal disease: Secondary | ICD-10-CM | POA: Diagnosis not present

## 2017-04-04 DIAGNOSIS — N2581 Secondary hyperparathyroidism of renal origin: Secondary | ICD-10-CM | POA: Diagnosis not present

## 2017-04-04 DIAGNOSIS — D631 Anemia in chronic kidney disease: Secondary | ICD-10-CM | POA: Diagnosis not present

## 2017-04-05 DIAGNOSIS — N2581 Secondary hyperparathyroidism of renal origin: Secondary | ICD-10-CM | POA: Diagnosis not present

## 2017-04-05 DIAGNOSIS — N186 End stage renal disease: Secondary | ICD-10-CM | POA: Diagnosis not present

## 2017-04-05 DIAGNOSIS — D631 Anemia in chronic kidney disease: Secondary | ICD-10-CM | POA: Diagnosis not present

## 2017-04-06 DIAGNOSIS — N2581 Secondary hyperparathyroidism of renal origin: Secondary | ICD-10-CM | POA: Diagnosis not present

## 2017-04-06 DIAGNOSIS — D631 Anemia in chronic kidney disease: Secondary | ICD-10-CM | POA: Diagnosis not present

## 2017-04-06 DIAGNOSIS — N186 End stage renal disease: Secondary | ICD-10-CM | POA: Diagnosis not present

## 2017-04-07 DIAGNOSIS — N2581 Secondary hyperparathyroidism of renal origin: Secondary | ICD-10-CM | POA: Diagnosis not present

## 2017-04-07 DIAGNOSIS — N186 End stage renal disease: Secondary | ICD-10-CM | POA: Diagnosis not present

## 2017-04-07 DIAGNOSIS — D631 Anemia in chronic kidney disease: Secondary | ICD-10-CM | POA: Diagnosis not present

## 2017-04-08 DIAGNOSIS — N2581 Secondary hyperparathyroidism of renal origin: Secondary | ICD-10-CM | POA: Diagnosis not present

## 2017-04-08 DIAGNOSIS — D631 Anemia in chronic kidney disease: Secondary | ICD-10-CM | POA: Diagnosis not present

## 2017-04-08 DIAGNOSIS — N186 End stage renal disease: Secondary | ICD-10-CM | POA: Diagnosis not present

## 2017-04-09 DIAGNOSIS — N186 End stage renal disease: Secondary | ICD-10-CM | POA: Diagnosis not present

## 2017-04-09 DIAGNOSIS — D631 Anemia in chronic kidney disease: Secondary | ICD-10-CM | POA: Diagnosis not present

## 2017-04-09 DIAGNOSIS — N2581 Secondary hyperparathyroidism of renal origin: Secondary | ICD-10-CM | POA: Diagnosis not present

## 2017-04-10 DIAGNOSIS — N2581 Secondary hyperparathyroidism of renal origin: Secondary | ICD-10-CM | POA: Diagnosis not present

## 2017-04-10 DIAGNOSIS — D631 Anemia in chronic kidney disease: Secondary | ICD-10-CM | POA: Diagnosis not present

## 2017-04-10 DIAGNOSIS — F29 Unspecified psychosis not due to a substance or known physiological condition: Secondary | ICD-10-CM | POA: Diagnosis not present

## 2017-04-10 DIAGNOSIS — N186 End stage renal disease: Secondary | ICD-10-CM | POA: Diagnosis not present

## 2017-04-11 DIAGNOSIS — N186 End stage renal disease: Secondary | ICD-10-CM | POA: Diagnosis not present

## 2017-04-11 DIAGNOSIS — N2581 Secondary hyperparathyroidism of renal origin: Secondary | ICD-10-CM | POA: Diagnosis not present

## 2017-04-11 DIAGNOSIS — D631 Anemia in chronic kidney disease: Secondary | ICD-10-CM | POA: Diagnosis not present

## 2017-04-12 DIAGNOSIS — D631 Anemia in chronic kidney disease: Secondary | ICD-10-CM | POA: Diagnosis not present

## 2017-04-12 DIAGNOSIS — N186 End stage renal disease: Secondary | ICD-10-CM | POA: Diagnosis not present

## 2017-04-12 DIAGNOSIS — N2581 Secondary hyperparathyroidism of renal origin: Secondary | ICD-10-CM | POA: Diagnosis not present

## 2017-04-13 DIAGNOSIS — D631 Anemia in chronic kidney disease: Secondary | ICD-10-CM | POA: Diagnosis not present

## 2017-04-13 DIAGNOSIS — N186 End stage renal disease: Secondary | ICD-10-CM | POA: Diagnosis not present

## 2017-04-13 DIAGNOSIS — N2581 Secondary hyperparathyroidism of renal origin: Secondary | ICD-10-CM | POA: Diagnosis not present

## 2017-04-14 DIAGNOSIS — D631 Anemia in chronic kidney disease: Secondary | ICD-10-CM | POA: Diagnosis not present

## 2017-04-14 DIAGNOSIS — N186 End stage renal disease: Secondary | ICD-10-CM | POA: Diagnosis not present

## 2017-04-14 DIAGNOSIS — N2581 Secondary hyperparathyroidism of renal origin: Secondary | ICD-10-CM | POA: Diagnosis not present

## 2017-04-15 DIAGNOSIS — N2581 Secondary hyperparathyroidism of renal origin: Secondary | ICD-10-CM | POA: Diagnosis not present

## 2017-04-15 DIAGNOSIS — D631 Anemia in chronic kidney disease: Secondary | ICD-10-CM | POA: Diagnosis not present

## 2017-04-15 DIAGNOSIS — N186 End stage renal disease: Secondary | ICD-10-CM | POA: Diagnosis not present

## 2017-04-16 DIAGNOSIS — N186 End stage renal disease: Secondary | ICD-10-CM | POA: Diagnosis not present

## 2017-04-16 DIAGNOSIS — D631 Anemia in chronic kidney disease: Secondary | ICD-10-CM | POA: Diagnosis not present

## 2017-04-16 DIAGNOSIS — N2581 Secondary hyperparathyroidism of renal origin: Secondary | ICD-10-CM | POA: Diagnosis not present

## 2017-04-17 DIAGNOSIS — N2581 Secondary hyperparathyroidism of renal origin: Secondary | ICD-10-CM | POA: Diagnosis not present

## 2017-04-17 DIAGNOSIS — N186 End stage renal disease: Secondary | ICD-10-CM | POA: Diagnosis not present

## 2017-04-17 DIAGNOSIS — D631 Anemia in chronic kidney disease: Secondary | ICD-10-CM | POA: Diagnosis not present

## 2017-04-18 DIAGNOSIS — D631 Anemia in chronic kidney disease: Secondary | ICD-10-CM | POA: Diagnosis not present

## 2017-04-18 DIAGNOSIS — L600 Ingrowing nail: Secondary | ICD-10-CM | POA: Diagnosis not present

## 2017-04-18 DIAGNOSIS — N2581 Secondary hyperparathyroidism of renal origin: Secondary | ICD-10-CM | POA: Diagnosis not present

## 2017-04-18 DIAGNOSIS — N186 End stage renal disease: Secondary | ICD-10-CM | POA: Diagnosis not present

## 2017-04-18 DIAGNOSIS — M7989 Other specified soft tissue disorders: Secondary | ICD-10-CM | POA: Diagnosis not present

## 2017-04-19 DIAGNOSIS — N2581 Secondary hyperparathyroidism of renal origin: Secondary | ICD-10-CM | POA: Diagnosis not present

## 2017-04-19 DIAGNOSIS — D631 Anemia in chronic kidney disease: Secondary | ICD-10-CM | POA: Diagnosis not present

## 2017-04-19 DIAGNOSIS — N186 End stage renal disease: Secondary | ICD-10-CM | POA: Diagnosis not present

## 2017-04-20 DIAGNOSIS — D631 Anemia in chronic kidney disease: Secondary | ICD-10-CM | POA: Diagnosis not present

## 2017-04-20 DIAGNOSIS — N186 End stage renal disease: Secondary | ICD-10-CM | POA: Diagnosis not present

## 2017-04-20 DIAGNOSIS — N2581 Secondary hyperparathyroidism of renal origin: Secondary | ICD-10-CM | POA: Diagnosis not present

## 2017-04-21 DIAGNOSIS — Z992 Dependence on renal dialysis: Secondary | ICD-10-CM | POA: Diagnosis not present

## 2017-04-21 DIAGNOSIS — N2581 Secondary hyperparathyroidism of renal origin: Secondary | ICD-10-CM | POA: Diagnosis not present

## 2017-04-21 DIAGNOSIS — D631 Anemia in chronic kidney disease: Secondary | ICD-10-CM | POA: Diagnosis not present

## 2017-04-21 DIAGNOSIS — N186 End stage renal disease: Secondary | ICD-10-CM | POA: Diagnosis not present

## 2017-04-22 DIAGNOSIS — N2581 Secondary hyperparathyroidism of renal origin: Secondary | ICD-10-CM | POA: Diagnosis not present

## 2017-04-22 DIAGNOSIS — N186 End stage renal disease: Secondary | ICD-10-CM | POA: Diagnosis not present

## 2017-04-22 DIAGNOSIS — D631 Anemia in chronic kidney disease: Secondary | ICD-10-CM | POA: Diagnosis not present

## 2017-04-23 DIAGNOSIS — N2581 Secondary hyperparathyroidism of renal origin: Secondary | ICD-10-CM | POA: Diagnosis not present

## 2017-04-23 DIAGNOSIS — N186 End stage renal disease: Secondary | ICD-10-CM | POA: Diagnosis not present

## 2017-04-23 DIAGNOSIS — D631 Anemia in chronic kidney disease: Secondary | ICD-10-CM | POA: Diagnosis not present

## 2017-04-24 DIAGNOSIS — D631 Anemia in chronic kidney disease: Secondary | ICD-10-CM | POA: Diagnosis not present

## 2017-04-24 DIAGNOSIS — T85691A Other mechanical complication of intraperitoneal dialysis catheter, initial encounter: Secondary | ICD-10-CM | POA: Diagnosis not present

## 2017-04-24 DIAGNOSIS — Z992 Dependence on renal dialysis: Secondary | ICD-10-CM | POA: Diagnosis not present

## 2017-04-24 DIAGNOSIS — N186 End stage renal disease: Secondary | ICD-10-CM | POA: Diagnosis not present

## 2017-04-24 DIAGNOSIS — N2581 Secondary hyperparathyroidism of renal origin: Secondary | ICD-10-CM | POA: Diagnosis not present

## 2017-04-24 DIAGNOSIS — T85611A Breakdown (mechanical) of intraperitoneal dialysis catheter, initial encounter: Secondary | ICD-10-CM | POA: Diagnosis not present

## 2017-04-25 DIAGNOSIS — N2581 Secondary hyperparathyroidism of renal origin: Secondary | ICD-10-CM | POA: Diagnosis not present

## 2017-04-25 DIAGNOSIS — N186 End stage renal disease: Secondary | ICD-10-CM | POA: Diagnosis not present

## 2017-04-25 DIAGNOSIS — D631 Anemia in chronic kidney disease: Secondary | ICD-10-CM | POA: Diagnosis not present

## 2017-04-26 DIAGNOSIS — N186 End stage renal disease: Secondary | ICD-10-CM | POA: Diagnosis not present

## 2017-04-26 DIAGNOSIS — D631 Anemia in chronic kidney disease: Secondary | ICD-10-CM | POA: Diagnosis not present

## 2017-04-26 DIAGNOSIS — N2581 Secondary hyperparathyroidism of renal origin: Secondary | ICD-10-CM | POA: Diagnosis not present

## 2017-04-27 DIAGNOSIS — N186 End stage renal disease: Secondary | ICD-10-CM | POA: Diagnosis not present

## 2017-04-27 DIAGNOSIS — N2581 Secondary hyperparathyroidism of renal origin: Secondary | ICD-10-CM | POA: Diagnosis not present

## 2017-04-27 DIAGNOSIS — D631 Anemia in chronic kidney disease: Secondary | ICD-10-CM | POA: Diagnosis not present

## 2017-04-28 DIAGNOSIS — N2581 Secondary hyperparathyroidism of renal origin: Secondary | ICD-10-CM | POA: Diagnosis not present

## 2017-04-28 DIAGNOSIS — N186 End stage renal disease: Secondary | ICD-10-CM | POA: Diagnosis not present

## 2017-04-28 DIAGNOSIS — D631 Anemia in chronic kidney disease: Secondary | ICD-10-CM | POA: Diagnosis not present

## 2017-04-29 DIAGNOSIS — N186 End stage renal disease: Secondary | ICD-10-CM | POA: Diagnosis not present

## 2017-04-29 DIAGNOSIS — D631 Anemia in chronic kidney disease: Secondary | ICD-10-CM | POA: Diagnosis not present

## 2017-04-29 DIAGNOSIS — N2581 Secondary hyperparathyroidism of renal origin: Secondary | ICD-10-CM | POA: Diagnosis not present

## 2017-04-30 DIAGNOSIS — D631 Anemia in chronic kidney disease: Secondary | ICD-10-CM | POA: Diagnosis not present

## 2017-04-30 DIAGNOSIS — N2581 Secondary hyperparathyroidism of renal origin: Secondary | ICD-10-CM | POA: Diagnosis not present

## 2017-04-30 DIAGNOSIS — N186 End stage renal disease: Secondary | ICD-10-CM | POA: Diagnosis not present

## 2017-05-01 DIAGNOSIS — N2581 Secondary hyperparathyroidism of renal origin: Secondary | ICD-10-CM | POA: Diagnosis not present

## 2017-05-01 DIAGNOSIS — D631 Anemia in chronic kidney disease: Secondary | ICD-10-CM | POA: Diagnosis not present

## 2017-05-01 DIAGNOSIS — N186 End stage renal disease: Secondary | ICD-10-CM | POA: Diagnosis not present

## 2017-05-02 DIAGNOSIS — N186 End stage renal disease: Secondary | ICD-10-CM | POA: Diagnosis not present

## 2017-05-02 DIAGNOSIS — N2581 Secondary hyperparathyroidism of renal origin: Secondary | ICD-10-CM | POA: Diagnosis not present

## 2017-05-02 DIAGNOSIS — D631 Anemia in chronic kidney disease: Secondary | ICD-10-CM | POA: Diagnosis not present

## 2017-05-03 DIAGNOSIS — N186 End stage renal disease: Secondary | ICD-10-CM | POA: Diagnosis not present

## 2017-05-03 DIAGNOSIS — D631 Anemia in chronic kidney disease: Secondary | ICD-10-CM | POA: Diagnosis not present

## 2017-05-03 DIAGNOSIS — N2581 Secondary hyperparathyroidism of renal origin: Secondary | ICD-10-CM | POA: Diagnosis not present

## 2017-05-04 DIAGNOSIS — E039 Hypothyroidism, unspecified: Secondary | ICD-10-CM | POA: Diagnosis not present

## 2017-05-04 DIAGNOSIS — I119 Hypertensive heart disease without heart failure: Secondary | ICD-10-CM | POA: Diagnosis not present

## 2017-05-04 DIAGNOSIS — R569 Unspecified convulsions: Secondary | ICD-10-CM | POA: Diagnosis not present

## 2017-05-04 DIAGNOSIS — Z113 Encounter for screening for infections with a predominantly sexual mode of transmission: Secondary | ICD-10-CM | POA: Diagnosis not present

## 2017-05-04 DIAGNOSIS — N186 End stage renal disease: Secondary | ICD-10-CM | POA: Diagnosis not present

## 2017-05-04 DIAGNOSIS — E785 Hyperlipidemia, unspecified: Secondary | ICD-10-CM | POA: Diagnosis not present

## 2017-05-04 DIAGNOSIS — Z30011 Encounter for initial prescription of contraceptive pills: Secondary | ICD-10-CM | POA: Diagnosis not present

## 2017-05-04 DIAGNOSIS — D539 Nutritional anemia, unspecified: Secondary | ICD-10-CM | POA: Diagnosis not present

## 2017-05-04 DIAGNOSIS — D631 Anemia in chronic kidney disease: Secondary | ICD-10-CM | POA: Diagnosis not present

## 2017-05-04 DIAGNOSIS — Z30019 Encounter for initial prescription of contraceptives, unspecified: Secondary | ICD-10-CM | POA: Diagnosis not present

## 2017-05-04 DIAGNOSIS — N2581 Secondary hyperparathyroidism of renal origin: Secondary | ICD-10-CM | POA: Diagnosis not present

## 2017-05-04 DIAGNOSIS — I10 Essential (primary) hypertension: Secondary | ICD-10-CM | POA: Diagnosis not present

## 2017-05-05 DIAGNOSIS — D631 Anemia in chronic kidney disease: Secondary | ICD-10-CM | POA: Diagnosis not present

## 2017-05-05 DIAGNOSIS — N2581 Secondary hyperparathyroidism of renal origin: Secondary | ICD-10-CM | POA: Diagnosis not present

## 2017-05-05 DIAGNOSIS — N186 End stage renal disease: Secondary | ICD-10-CM | POA: Diagnosis not present

## 2017-05-06 DIAGNOSIS — N186 End stage renal disease: Secondary | ICD-10-CM | POA: Diagnosis not present

## 2017-05-06 DIAGNOSIS — N2581 Secondary hyperparathyroidism of renal origin: Secondary | ICD-10-CM | POA: Diagnosis not present

## 2017-05-06 DIAGNOSIS — D631 Anemia in chronic kidney disease: Secondary | ICD-10-CM | POA: Diagnosis not present

## 2017-05-07 DIAGNOSIS — N186 End stage renal disease: Secondary | ICD-10-CM | POA: Diagnosis not present

## 2017-05-07 DIAGNOSIS — D631 Anemia in chronic kidney disease: Secondary | ICD-10-CM | POA: Diagnosis not present

## 2017-05-07 DIAGNOSIS — N2581 Secondary hyperparathyroidism of renal origin: Secondary | ICD-10-CM | POA: Diagnosis not present

## 2017-05-08 DIAGNOSIS — D631 Anemia in chronic kidney disease: Secondary | ICD-10-CM | POA: Diagnosis not present

## 2017-05-08 DIAGNOSIS — N2581 Secondary hyperparathyroidism of renal origin: Secondary | ICD-10-CM | POA: Diagnosis not present

## 2017-05-08 DIAGNOSIS — N186 End stage renal disease: Secondary | ICD-10-CM | POA: Diagnosis not present

## 2017-05-09 DIAGNOSIS — B351 Tinea unguium: Secondary | ICD-10-CM | POA: Diagnosis not present

## 2017-05-09 DIAGNOSIS — M79609 Pain in unspecified limb: Secondary | ICD-10-CM | POA: Diagnosis not present

## 2017-05-09 DIAGNOSIS — D631 Anemia in chronic kidney disease: Secondary | ICD-10-CM | POA: Diagnosis not present

## 2017-05-09 DIAGNOSIS — N186 End stage renal disease: Secondary | ICD-10-CM | POA: Diagnosis not present

## 2017-05-09 DIAGNOSIS — N2581 Secondary hyperparathyroidism of renal origin: Secondary | ICD-10-CM | POA: Diagnosis not present

## 2017-05-10 DIAGNOSIS — D631 Anemia in chronic kidney disease: Secondary | ICD-10-CM | POA: Diagnosis not present

## 2017-05-10 DIAGNOSIS — N2581 Secondary hyperparathyroidism of renal origin: Secondary | ICD-10-CM | POA: Diagnosis not present

## 2017-05-10 DIAGNOSIS — N186 End stage renal disease: Secondary | ICD-10-CM | POA: Diagnosis not present

## 2017-05-11 DIAGNOSIS — D631 Anemia in chronic kidney disease: Secondary | ICD-10-CM | POA: Diagnosis not present

## 2017-05-11 DIAGNOSIS — N186 End stage renal disease: Secondary | ICD-10-CM | POA: Diagnosis not present

## 2017-05-11 DIAGNOSIS — N2581 Secondary hyperparathyroidism of renal origin: Secondary | ICD-10-CM | POA: Diagnosis not present

## 2017-05-12 DIAGNOSIS — N186 End stage renal disease: Secondary | ICD-10-CM | POA: Diagnosis not present

## 2017-05-12 DIAGNOSIS — D631 Anemia in chronic kidney disease: Secondary | ICD-10-CM | POA: Diagnosis not present

## 2017-05-12 DIAGNOSIS — N2581 Secondary hyperparathyroidism of renal origin: Secondary | ICD-10-CM | POA: Diagnosis not present

## 2017-05-13 DIAGNOSIS — N2581 Secondary hyperparathyroidism of renal origin: Secondary | ICD-10-CM | POA: Diagnosis not present

## 2017-05-13 DIAGNOSIS — D631 Anemia in chronic kidney disease: Secondary | ICD-10-CM | POA: Diagnosis not present

## 2017-05-13 DIAGNOSIS — N186 End stage renal disease: Secondary | ICD-10-CM | POA: Diagnosis not present

## 2017-05-14 DIAGNOSIS — D631 Anemia in chronic kidney disease: Secondary | ICD-10-CM | POA: Diagnosis not present

## 2017-05-14 DIAGNOSIS — N186 End stage renal disease: Secondary | ICD-10-CM | POA: Diagnosis not present

## 2017-05-14 DIAGNOSIS — N2581 Secondary hyperparathyroidism of renal origin: Secondary | ICD-10-CM | POA: Diagnosis not present

## 2017-05-15 DIAGNOSIS — N2581 Secondary hyperparathyroidism of renal origin: Secondary | ICD-10-CM | POA: Diagnosis not present

## 2017-05-15 DIAGNOSIS — N186 End stage renal disease: Secondary | ICD-10-CM | POA: Diagnosis not present

## 2017-05-15 DIAGNOSIS — D631 Anemia in chronic kidney disease: Secondary | ICD-10-CM | POA: Diagnosis not present

## 2017-05-16 DIAGNOSIS — N2581 Secondary hyperparathyroidism of renal origin: Secondary | ICD-10-CM | POA: Diagnosis not present

## 2017-05-16 DIAGNOSIS — N186 End stage renal disease: Secondary | ICD-10-CM | POA: Diagnosis not present

## 2017-05-16 DIAGNOSIS — D631 Anemia in chronic kidney disease: Secondary | ICD-10-CM | POA: Diagnosis not present

## 2017-05-17 DIAGNOSIS — N2581 Secondary hyperparathyroidism of renal origin: Secondary | ICD-10-CM | POA: Diagnosis not present

## 2017-05-17 DIAGNOSIS — N186 End stage renal disease: Secondary | ICD-10-CM | POA: Diagnosis not present

## 2017-05-17 DIAGNOSIS — D631 Anemia in chronic kidney disease: Secondary | ICD-10-CM | POA: Diagnosis not present

## 2017-05-18 DIAGNOSIS — N186 End stage renal disease: Secondary | ICD-10-CM | POA: Diagnosis not present

## 2017-05-18 DIAGNOSIS — N2581 Secondary hyperparathyroidism of renal origin: Secondary | ICD-10-CM | POA: Diagnosis not present

## 2017-05-18 DIAGNOSIS — D631 Anemia in chronic kidney disease: Secondary | ICD-10-CM | POA: Diagnosis not present

## 2017-05-19 DIAGNOSIS — D631 Anemia in chronic kidney disease: Secondary | ICD-10-CM | POA: Diagnosis not present

## 2017-05-19 DIAGNOSIS — N186 End stage renal disease: Secondary | ICD-10-CM | POA: Diagnosis not present

## 2017-05-19 DIAGNOSIS — N2581 Secondary hyperparathyroidism of renal origin: Secondary | ICD-10-CM | POA: Diagnosis not present

## 2017-05-20 DIAGNOSIS — N186 End stage renal disease: Secondary | ICD-10-CM | POA: Diagnosis not present

## 2017-05-20 DIAGNOSIS — N2581 Secondary hyperparathyroidism of renal origin: Secondary | ICD-10-CM | POA: Diagnosis not present

## 2017-05-20 DIAGNOSIS — D631 Anemia in chronic kidney disease: Secondary | ICD-10-CM | POA: Diagnosis not present

## 2017-05-21 DIAGNOSIS — D631 Anemia in chronic kidney disease: Secondary | ICD-10-CM | POA: Diagnosis not present

## 2017-05-21 DIAGNOSIS — N186 End stage renal disease: Secondary | ICD-10-CM | POA: Diagnosis not present

## 2017-05-21 DIAGNOSIS — N2581 Secondary hyperparathyroidism of renal origin: Secondary | ICD-10-CM | POA: Diagnosis not present

## 2017-05-22 DIAGNOSIS — Z992 Dependence on renal dialysis: Secondary | ICD-10-CM | POA: Diagnosis not present

## 2017-05-22 DIAGNOSIS — D631 Anemia in chronic kidney disease: Secondary | ICD-10-CM | POA: Diagnosis not present

## 2017-05-22 DIAGNOSIS — N186 End stage renal disease: Secondary | ICD-10-CM | POA: Diagnosis not present

## 2017-05-22 DIAGNOSIS — N2581 Secondary hyperparathyroidism of renal origin: Secondary | ICD-10-CM | POA: Diagnosis not present

## 2017-05-23 DIAGNOSIS — N186 End stage renal disease: Secondary | ICD-10-CM | POA: Diagnosis not present

## 2017-05-23 DIAGNOSIS — N2581 Secondary hyperparathyroidism of renal origin: Secondary | ICD-10-CM | POA: Diagnosis not present

## 2017-05-23 DIAGNOSIS — D631 Anemia in chronic kidney disease: Secondary | ICD-10-CM | POA: Diagnosis not present

## 2017-05-24 DIAGNOSIS — N186 End stage renal disease: Secondary | ICD-10-CM | POA: Diagnosis not present

## 2017-05-24 DIAGNOSIS — N2581 Secondary hyperparathyroidism of renal origin: Secondary | ICD-10-CM | POA: Diagnosis not present

## 2017-05-24 DIAGNOSIS — D631 Anemia in chronic kidney disease: Secondary | ICD-10-CM | POA: Diagnosis not present

## 2017-05-25 DIAGNOSIS — D631 Anemia in chronic kidney disease: Secondary | ICD-10-CM | POA: Diagnosis not present

## 2017-05-25 DIAGNOSIS — N186 End stage renal disease: Secondary | ICD-10-CM | POA: Diagnosis not present

## 2017-05-25 DIAGNOSIS — N2581 Secondary hyperparathyroidism of renal origin: Secondary | ICD-10-CM | POA: Diagnosis not present

## 2017-05-26 DIAGNOSIS — D631 Anemia in chronic kidney disease: Secondary | ICD-10-CM | POA: Diagnosis not present

## 2017-05-26 DIAGNOSIS — N2581 Secondary hyperparathyroidism of renal origin: Secondary | ICD-10-CM | POA: Diagnosis not present

## 2017-05-26 DIAGNOSIS — N186 End stage renal disease: Secondary | ICD-10-CM | POA: Diagnosis not present

## 2017-05-27 DIAGNOSIS — D631 Anemia in chronic kidney disease: Secondary | ICD-10-CM | POA: Diagnosis not present

## 2017-05-27 DIAGNOSIS — N186 End stage renal disease: Secondary | ICD-10-CM | POA: Diagnosis not present

## 2017-05-27 DIAGNOSIS — N2581 Secondary hyperparathyroidism of renal origin: Secondary | ICD-10-CM | POA: Diagnosis not present

## 2017-05-28 DIAGNOSIS — D631 Anemia in chronic kidney disease: Secondary | ICD-10-CM | POA: Diagnosis not present

## 2017-05-28 DIAGNOSIS — N2581 Secondary hyperparathyroidism of renal origin: Secondary | ICD-10-CM | POA: Diagnosis not present

## 2017-05-28 DIAGNOSIS — N186 End stage renal disease: Secondary | ICD-10-CM | POA: Diagnosis not present

## 2017-05-29 DIAGNOSIS — D631 Anemia in chronic kidney disease: Secondary | ICD-10-CM | POA: Diagnosis not present

## 2017-05-29 DIAGNOSIS — N2581 Secondary hyperparathyroidism of renal origin: Secondary | ICD-10-CM | POA: Diagnosis not present

## 2017-05-29 DIAGNOSIS — N186 End stage renal disease: Secondary | ICD-10-CM | POA: Diagnosis not present

## 2017-05-30 DIAGNOSIS — N186 End stage renal disease: Secondary | ICD-10-CM | POA: Diagnosis not present

## 2017-05-30 DIAGNOSIS — N2581 Secondary hyperparathyroidism of renal origin: Secondary | ICD-10-CM | POA: Diagnosis not present

## 2017-05-30 DIAGNOSIS — D631 Anemia in chronic kidney disease: Secondary | ICD-10-CM | POA: Diagnosis not present

## 2017-05-31 DIAGNOSIS — N2581 Secondary hyperparathyroidism of renal origin: Secondary | ICD-10-CM | POA: Diagnosis not present

## 2017-05-31 DIAGNOSIS — N186 End stage renal disease: Secondary | ICD-10-CM | POA: Diagnosis not present

## 2017-05-31 DIAGNOSIS — D631 Anemia in chronic kidney disease: Secondary | ICD-10-CM | POA: Diagnosis not present

## 2017-06-01 DIAGNOSIS — D631 Anemia in chronic kidney disease: Secondary | ICD-10-CM | POA: Diagnosis not present

## 2017-06-01 DIAGNOSIS — N186 End stage renal disease: Secondary | ICD-10-CM | POA: Diagnosis not present

## 2017-06-01 DIAGNOSIS — N2581 Secondary hyperparathyroidism of renal origin: Secondary | ICD-10-CM | POA: Diagnosis not present

## 2017-06-02 DIAGNOSIS — D631 Anemia in chronic kidney disease: Secondary | ICD-10-CM | POA: Diagnosis not present

## 2017-06-02 DIAGNOSIS — N186 End stage renal disease: Secondary | ICD-10-CM | POA: Diagnosis not present

## 2017-06-02 DIAGNOSIS — N2581 Secondary hyperparathyroidism of renal origin: Secondary | ICD-10-CM | POA: Diagnosis not present

## 2017-06-03 DIAGNOSIS — N186 End stage renal disease: Secondary | ICD-10-CM | POA: Diagnosis not present

## 2017-06-03 DIAGNOSIS — D631 Anemia in chronic kidney disease: Secondary | ICD-10-CM | POA: Diagnosis not present

## 2017-06-03 DIAGNOSIS — N2581 Secondary hyperparathyroidism of renal origin: Secondary | ICD-10-CM | POA: Diagnosis not present

## 2017-06-04 DIAGNOSIS — N186 End stage renal disease: Secondary | ICD-10-CM | POA: Diagnosis not present

## 2017-06-04 DIAGNOSIS — N2581 Secondary hyperparathyroidism of renal origin: Secondary | ICD-10-CM | POA: Diagnosis not present

## 2017-06-04 DIAGNOSIS — D631 Anemia in chronic kidney disease: Secondary | ICD-10-CM | POA: Diagnosis not present

## 2017-06-05 DIAGNOSIS — D631 Anemia in chronic kidney disease: Secondary | ICD-10-CM | POA: Diagnosis not present

## 2017-06-05 DIAGNOSIS — N2581 Secondary hyperparathyroidism of renal origin: Secondary | ICD-10-CM | POA: Diagnosis not present

## 2017-06-05 DIAGNOSIS — N186 End stage renal disease: Secondary | ICD-10-CM | POA: Diagnosis not present

## 2017-06-06 DIAGNOSIS — N186 End stage renal disease: Secondary | ICD-10-CM | POA: Diagnosis not present

## 2017-06-06 DIAGNOSIS — N2581 Secondary hyperparathyroidism of renal origin: Secondary | ICD-10-CM | POA: Diagnosis not present

## 2017-06-06 DIAGNOSIS — D631 Anemia in chronic kidney disease: Secondary | ICD-10-CM | POA: Diagnosis not present

## 2017-06-06 DIAGNOSIS — F29 Unspecified psychosis not due to a substance or known physiological condition: Secondary | ICD-10-CM | POA: Diagnosis not present

## 2017-06-07 DIAGNOSIS — D631 Anemia in chronic kidney disease: Secondary | ICD-10-CM | POA: Diagnosis not present

## 2017-06-07 DIAGNOSIS — N2581 Secondary hyperparathyroidism of renal origin: Secondary | ICD-10-CM | POA: Diagnosis not present

## 2017-06-07 DIAGNOSIS — N186 End stage renal disease: Secondary | ICD-10-CM | POA: Diagnosis not present

## 2017-06-08 DIAGNOSIS — N2581 Secondary hyperparathyroidism of renal origin: Secondary | ICD-10-CM | POA: Diagnosis not present

## 2017-06-08 DIAGNOSIS — N186 End stage renal disease: Secondary | ICD-10-CM | POA: Diagnosis not present

## 2017-06-08 DIAGNOSIS — D631 Anemia in chronic kidney disease: Secondary | ICD-10-CM | POA: Diagnosis not present

## 2017-06-09 DIAGNOSIS — N186 End stage renal disease: Secondary | ICD-10-CM | POA: Diagnosis not present

## 2017-06-09 DIAGNOSIS — N2581 Secondary hyperparathyroidism of renal origin: Secondary | ICD-10-CM | POA: Diagnosis not present

## 2017-06-09 DIAGNOSIS — D631 Anemia in chronic kidney disease: Secondary | ICD-10-CM | POA: Diagnosis not present

## 2017-06-10 DIAGNOSIS — D631 Anemia in chronic kidney disease: Secondary | ICD-10-CM | POA: Diagnosis not present

## 2017-06-10 DIAGNOSIS — N186 End stage renal disease: Secondary | ICD-10-CM | POA: Diagnosis not present

## 2017-06-10 DIAGNOSIS — N2581 Secondary hyperparathyroidism of renal origin: Secondary | ICD-10-CM | POA: Diagnosis not present

## 2017-06-11 DIAGNOSIS — D631 Anemia in chronic kidney disease: Secondary | ICD-10-CM | POA: Diagnosis not present

## 2017-06-11 DIAGNOSIS — N186 End stage renal disease: Secondary | ICD-10-CM | POA: Diagnosis not present

## 2017-06-11 DIAGNOSIS — N2581 Secondary hyperparathyroidism of renal origin: Secondary | ICD-10-CM | POA: Diagnosis not present

## 2017-06-12 DIAGNOSIS — N186 End stage renal disease: Secondary | ICD-10-CM | POA: Diagnosis not present

## 2017-06-12 DIAGNOSIS — N2581 Secondary hyperparathyroidism of renal origin: Secondary | ICD-10-CM | POA: Diagnosis not present

## 2017-06-12 DIAGNOSIS — D631 Anemia in chronic kidney disease: Secondary | ICD-10-CM | POA: Diagnosis not present

## 2017-06-13 DIAGNOSIS — N2581 Secondary hyperparathyroidism of renal origin: Secondary | ICD-10-CM | POA: Diagnosis not present

## 2017-06-13 DIAGNOSIS — N186 End stage renal disease: Secondary | ICD-10-CM | POA: Diagnosis not present

## 2017-06-13 DIAGNOSIS — D631 Anemia in chronic kidney disease: Secondary | ICD-10-CM | POA: Diagnosis not present

## 2017-06-14 DIAGNOSIS — D631 Anemia in chronic kidney disease: Secondary | ICD-10-CM | POA: Diagnosis not present

## 2017-06-14 DIAGNOSIS — N186 End stage renal disease: Secondary | ICD-10-CM | POA: Diagnosis not present

## 2017-06-14 DIAGNOSIS — N2581 Secondary hyperparathyroidism of renal origin: Secondary | ICD-10-CM | POA: Diagnosis not present

## 2017-06-15 DIAGNOSIS — D631 Anemia in chronic kidney disease: Secondary | ICD-10-CM | POA: Diagnosis not present

## 2017-06-15 DIAGNOSIS — N2581 Secondary hyperparathyroidism of renal origin: Secondary | ICD-10-CM | POA: Diagnosis not present

## 2017-06-15 DIAGNOSIS — N186 End stage renal disease: Secondary | ICD-10-CM | POA: Diagnosis not present

## 2017-06-16 DIAGNOSIS — D631 Anemia in chronic kidney disease: Secondary | ICD-10-CM | POA: Diagnosis not present

## 2017-06-16 DIAGNOSIS — N186 End stage renal disease: Secondary | ICD-10-CM | POA: Diagnosis not present

## 2017-06-16 DIAGNOSIS — N2581 Secondary hyperparathyroidism of renal origin: Secondary | ICD-10-CM | POA: Diagnosis not present

## 2017-06-17 DIAGNOSIS — N186 End stage renal disease: Secondary | ICD-10-CM | POA: Diagnosis not present

## 2017-06-17 DIAGNOSIS — N2581 Secondary hyperparathyroidism of renal origin: Secondary | ICD-10-CM | POA: Diagnosis not present

## 2017-06-17 DIAGNOSIS — D631 Anemia in chronic kidney disease: Secondary | ICD-10-CM | POA: Diagnosis not present

## 2017-06-18 DIAGNOSIS — N186 End stage renal disease: Secondary | ICD-10-CM | POA: Diagnosis not present

## 2017-06-18 DIAGNOSIS — D631 Anemia in chronic kidney disease: Secondary | ICD-10-CM | POA: Diagnosis not present

## 2017-06-18 DIAGNOSIS — N2581 Secondary hyperparathyroidism of renal origin: Secondary | ICD-10-CM | POA: Diagnosis not present

## 2017-06-19 DIAGNOSIS — N2581 Secondary hyperparathyroidism of renal origin: Secondary | ICD-10-CM | POA: Diagnosis not present

## 2017-06-19 DIAGNOSIS — N186 End stage renal disease: Secondary | ICD-10-CM | POA: Diagnosis not present

## 2017-06-19 DIAGNOSIS — D631 Anemia in chronic kidney disease: Secondary | ICD-10-CM | POA: Diagnosis not present

## 2017-06-20 DIAGNOSIS — D631 Anemia in chronic kidney disease: Secondary | ICD-10-CM | POA: Diagnosis not present

## 2017-06-20 DIAGNOSIS — N186 End stage renal disease: Secondary | ICD-10-CM | POA: Diagnosis not present

## 2017-06-20 DIAGNOSIS — N2581 Secondary hyperparathyroidism of renal origin: Secondary | ICD-10-CM | POA: Diagnosis not present

## 2017-06-21 DIAGNOSIS — D631 Anemia in chronic kidney disease: Secondary | ICD-10-CM | POA: Diagnosis not present

## 2017-06-21 DIAGNOSIS — N2581 Secondary hyperparathyroidism of renal origin: Secondary | ICD-10-CM | POA: Diagnosis not present

## 2017-06-21 DIAGNOSIS — N186 End stage renal disease: Secondary | ICD-10-CM | POA: Diagnosis not present

## 2017-06-22 DIAGNOSIS — N186 End stage renal disease: Secondary | ICD-10-CM | POA: Diagnosis not present

## 2017-06-22 DIAGNOSIS — D631 Anemia in chronic kidney disease: Secondary | ICD-10-CM | POA: Diagnosis not present

## 2017-06-22 DIAGNOSIS — N2581 Secondary hyperparathyroidism of renal origin: Secondary | ICD-10-CM | POA: Diagnosis not present

## 2017-06-22 DIAGNOSIS — Z992 Dependence on renal dialysis: Secondary | ICD-10-CM | POA: Diagnosis not present

## 2017-06-23 DIAGNOSIS — Z23 Encounter for immunization: Secondary | ICD-10-CM | POA: Diagnosis not present

## 2017-06-23 DIAGNOSIS — N186 End stage renal disease: Secondary | ICD-10-CM | POA: Diagnosis not present

## 2017-06-23 DIAGNOSIS — D631 Anemia in chronic kidney disease: Secondary | ICD-10-CM | POA: Diagnosis not present

## 2017-06-23 DIAGNOSIS — N2581 Secondary hyperparathyroidism of renal origin: Secondary | ICD-10-CM | POA: Diagnosis not present

## 2017-06-24 DIAGNOSIS — N186 End stage renal disease: Secondary | ICD-10-CM | POA: Diagnosis not present

## 2017-06-24 DIAGNOSIS — D631 Anemia in chronic kidney disease: Secondary | ICD-10-CM | POA: Diagnosis not present

## 2017-06-24 DIAGNOSIS — Z23 Encounter for immunization: Secondary | ICD-10-CM | POA: Diagnosis not present

## 2017-06-24 DIAGNOSIS — N2581 Secondary hyperparathyroidism of renal origin: Secondary | ICD-10-CM | POA: Diagnosis not present

## 2017-06-25 DIAGNOSIS — Z23 Encounter for immunization: Secondary | ICD-10-CM | POA: Diagnosis not present

## 2017-06-25 DIAGNOSIS — N186 End stage renal disease: Secondary | ICD-10-CM | POA: Diagnosis not present

## 2017-06-25 DIAGNOSIS — D631 Anemia in chronic kidney disease: Secondary | ICD-10-CM | POA: Diagnosis not present

## 2017-06-25 DIAGNOSIS — N2581 Secondary hyperparathyroidism of renal origin: Secondary | ICD-10-CM | POA: Diagnosis not present

## 2017-06-26 DIAGNOSIS — Z4932 Encounter for adequacy testing for peritoneal dialysis: Secondary | ICD-10-CM | POA: Diagnosis not present

## 2017-06-26 DIAGNOSIS — N186 End stage renal disease: Secondary | ICD-10-CM | POA: Diagnosis not present

## 2017-06-26 DIAGNOSIS — N2581 Secondary hyperparathyroidism of renal origin: Secondary | ICD-10-CM | POA: Diagnosis not present

## 2017-06-26 DIAGNOSIS — D631 Anemia in chronic kidney disease: Secondary | ICD-10-CM | POA: Diagnosis not present

## 2017-06-26 DIAGNOSIS — Z23 Encounter for immunization: Secondary | ICD-10-CM | POA: Diagnosis not present

## 2017-06-27 DIAGNOSIS — N2581 Secondary hyperparathyroidism of renal origin: Secondary | ICD-10-CM | POA: Diagnosis not present

## 2017-06-27 DIAGNOSIS — Z23 Encounter for immunization: Secondary | ICD-10-CM | POA: Diagnosis not present

## 2017-06-27 DIAGNOSIS — D631 Anemia in chronic kidney disease: Secondary | ICD-10-CM | POA: Diagnosis not present

## 2017-06-27 DIAGNOSIS — N186 End stage renal disease: Secondary | ICD-10-CM | POA: Diagnosis not present

## 2017-06-28 DIAGNOSIS — N2581 Secondary hyperparathyroidism of renal origin: Secondary | ICD-10-CM | POA: Diagnosis not present

## 2017-06-28 DIAGNOSIS — D631 Anemia in chronic kidney disease: Secondary | ICD-10-CM | POA: Diagnosis not present

## 2017-06-28 DIAGNOSIS — Z23 Encounter for immunization: Secondary | ICD-10-CM | POA: Diagnosis not present

## 2017-06-28 DIAGNOSIS — N186 End stage renal disease: Secondary | ICD-10-CM | POA: Diagnosis not present

## 2017-06-29 DIAGNOSIS — D631 Anemia in chronic kidney disease: Secondary | ICD-10-CM | POA: Diagnosis not present

## 2017-06-29 DIAGNOSIS — N186 End stage renal disease: Secondary | ICD-10-CM | POA: Diagnosis not present

## 2017-06-29 DIAGNOSIS — N2581 Secondary hyperparathyroidism of renal origin: Secondary | ICD-10-CM | POA: Diagnosis not present

## 2017-06-29 DIAGNOSIS — Z23 Encounter for immunization: Secondary | ICD-10-CM | POA: Diagnosis not present

## 2017-06-30 DIAGNOSIS — Z23 Encounter for immunization: Secondary | ICD-10-CM | POA: Diagnosis not present

## 2017-06-30 DIAGNOSIS — D631 Anemia in chronic kidney disease: Secondary | ICD-10-CM | POA: Diagnosis not present

## 2017-06-30 DIAGNOSIS — N2581 Secondary hyperparathyroidism of renal origin: Secondary | ICD-10-CM | POA: Diagnosis not present

## 2017-06-30 DIAGNOSIS — N186 End stage renal disease: Secondary | ICD-10-CM | POA: Diagnosis not present

## 2017-07-01 DIAGNOSIS — Z23 Encounter for immunization: Secondary | ICD-10-CM | POA: Diagnosis not present

## 2017-07-01 DIAGNOSIS — N186 End stage renal disease: Secondary | ICD-10-CM | POA: Diagnosis not present

## 2017-07-01 DIAGNOSIS — N2581 Secondary hyperparathyroidism of renal origin: Secondary | ICD-10-CM | POA: Diagnosis not present

## 2017-07-01 DIAGNOSIS — D631 Anemia in chronic kidney disease: Secondary | ICD-10-CM | POA: Diagnosis not present

## 2017-07-02 DIAGNOSIS — N186 End stage renal disease: Secondary | ICD-10-CM | POA: Diagnosis not present

## 2017-07-02 DIAGNOSIS — N2581 Secondary hyperparathyroidism of renal origin: Secondary | ICD-10-CM | POA: Diagnosis not present

## 2017-07-02 DIAGNOSIS — D631 Anemia in chronic kidney disease: Secondary | ICD-10-CM | POA: Diagnosis not present

## 2017-07-02 DIAGNOSIS — Z23 Encounter for immunization: Secondary | ICD-10-CM | POA: Diagnosis not present

## 2017-07-03 DIAGNOSIS — N2581 Secondary hyperparathyroidism of renal origin: Secondary | ICD-10-CM | POA: Diagnosis not present

## 2017-07-03 DIAGNOSIS — N186 End stage renal disease: Secondary | ICD-10-CM | POA: Diagnosis not present

## 2017-07-03 DIAGNOSIS — Z23 Encounter for immunization: Secondary | ICD-10-CM | POA: Diagnosis not present

## 2017-07-03 DIAGNOSIS — D631 Anemia in chronic kidney disease: Secondary | ICD-10-CM | POA: Diagnosis not present

## 2017-07-04 DIAGNOSIS — Z23 Encounter for immunization: Secondary | ICD-10-CM | POA: Diagnosis not present

## 2017-07-04 DIAGNOSIS — N2581 Secondary hyperparathyroidism of renal origin: Secondary | ICD-10-CM | POA: Diagnosis not present

## 2017-07-04 DIAGNOSIS — N186 End stage renal disease: Secondary | ICD-10-CM | POA: Diagnosis not present

## 2017-07-04 DIAGNOSIS — D631 Anemia in chronic kidney disease: Secondary | ICD-10-CM | POA: Diagnosis not present

## 2017-07-05 DIAGNOSIS — D631 Anemia in chronic kidney disease: Secondary | ICD-10-CM | POA: Diagnosis not present

## 2017-07-05 DIAGNOSIS — Z23 Encounter for immunization: Secondary | ICD-10-CM | POA: Diagnosis not present

## 2017-07-05 DIAGNOSIS — N186 End stage renal disease: Secondary | ICD-10-CM | POA: Diagnosis not present

## 2017-07-05 DIAGNOSIS — N2581 Secondary hyperparathyroidism of renal origin: Secondary | ICD-10-CM | POA: Diagnosis not present

## 2017-07-06 DIAGNOSIS — Z23 Encounter for immunization: Secondary | ICD-10-CM | POA: Diagnosis not present

## 2017-07-06 DIAGNOSIS — N186 End stage renal disease: Secondary | ICD-10-CM | POA: Diagnosis not present

## 2017-07-06 DIAGNOSIS — N2581 Secondary hyperparathyroidism of renal origin: Secondary | ICD-10-CM | POA: Diagnosis not present

## 2017-07-06 DIAGNOSIS — D631 Anemia in chronic kidney disease: Secondary | ICD-10-CM | POA: Diagnosis not present

## 2017-07-07 DIAGNOSIS — N186 End stage renal disease: Secondary | ICD-10-CM | POA: Diagnosis not present

## 2017-07-07 DIAGNOSIS — Z23 Encounter for immunization: Secondary | ICD-10-CM | POA: Diagnosis not present

## 2017-07-07 DIAGNOSIS — N2581 Secondary hyperparathyroidism of renal origin: Secondary | ICD-10-CM | POA: Diagnosis not present

## 2017-07-07 DIAGNOSIS — D631 Anemia in chronic kidney disease: Secondary | ICD-10-CM | POA: Diagnosis not present

## 2017-07-08 DIAGNOSIS — N186 End stage renal disease: Secondary | ICD-10-CM | POA: Diagnosis not present

## 2017-07-08 DIAGNOSIS — D631 Anemia in chronic kidney disease: Secondary | ICD-10-CM | POA: Diagnosis not present

## 2017-07-08 DIAGNOSIS — Z23 Encounter for immunization: Secondary | ICD-10-CM | POA: Diagnosis not present

## 2017-07-08 DIAGNOSIS — N2581 Secondary hyperparathyroidism of renal origin: Secondary | ICD-10-CM | POA: Diagnosis not present

## 2017-07-09 DIAGNOSIS — N2581 Secondary hyperparathyroidism of renal origin: Secondary | ICD-10-CM | POA: Diagnosis not present

## 2017-07-09 DIAGNOSIS — N186 End stage renal disease: Secondary | ICD-10-CM | POA: Diagnosis not present

## 2017-07-09 DIAGNOSIS — D631 Anemia in chronic kidney disease: Secondary | ICD-10-CM | POA: Diagnosis not present

## 2017-07-09 DIAGNOSIS — Z23 Encounter for immunization: Secondary | ICD-10-CM | POA: Diagnosis not present

## 2017-07-10 DIAGNOSIS — Z23 Encounter for immunization: Secondary | ICD-10-CM | POA: Diagnosis not present

## 2017-07-10 DIAGNOSIS — N186 End stage renal disease: Secondary | ICD-10-CM | POA: Diagnosis not present

## 2017-07-10 DIAGNOSIS — D631 Anemia in chronic kidney disease: Secondary | ICD-10-CM | POA: Diagnosis not present

## 2017-07-10 DIAGNOSIS — R569 Unspecified convulsions: Secondary | ICD-10-CM | POA: Diagnosis not present

## 2017-07-10 DIAGNOSIS — N2581 Secondary hyperparathyroidism of renal origin: Secondary | ICD-10-CM | POA: Diagnosis not present

## 2017-07-11 DIAGNOSIS — N2581 Secondary hyperparathyroidism of renal origin: Secondary | ICD-10-CM | POA: Diagnosis not present

## 2017-07-11 DIAGNOSIS — D631 Anemia in chronic kidney disease: Secondary | ICD-10-CM | POA: Diagnosis not present

## 2017-07-11 DIAGNOSIS — Z23 Encounter for immunization: Secondary | ICD-10-CM | POA: Diagnosis not present

## 2017-07-11 DIAGNOSIS — N186 End stage renal disease: Secondary | ICD-10-CM | POA: Diagnosis not present

## 2017-07-12 ENCOUNTER — Encounter (HOSPITAL_COMMUNITY): Payer: Self-pay | Admitting: Emergency Medicine

## 2017-07-12 ENCOUNTER — Emergency Department (HOSPITAL_COMMUNITY): Payer: Medicare Other

## 2017-07-12 ENCOUNTER — Emergency Department (HOSPITAL_COMMUNITY)
Admission: EM | Admit: 2017-07-12 | Discharge: 2017-07-12 | Disposition: A | Discharge status: Home / Self Care | Payer: Medicare Other | Attending: Emergency Medicine | Admitting: Emergency Medicine

## 2017-07-12 DIAGNOSIS — F209 Schizophrenia, unspecified: Secondary | ICD-10-CM | POA: Diagnosis not present

## 2017-07-12 DIAGNOSIS — E039 Hypothyroidism, unspecified: Secondary | ICD-10-CM | POA: Diagnosis not present

## 2017-07-12 DIAGNOSIS — F71 Moderate intellectual disabilities: Secondary | ICD-10-CM | POA: Insufficient documentation

## 2017-07-12 DIAGNOSIS — I951 Orthostatic hypotension: Secondary | ICD-10-CM | POA: Insufficient documentation

## 2017-07-12 DIAGNOSIS — S0990XA Unspecified injury of head, initial encounter: Secondary | ICD-10-CM | POA: Insufficient documentation

## 2017-07-12 DIAGNOSIS — D631 Anemia in chronic kidney disease: Secondary | ICD-10-CM | POA: Diagnosis not present

## 2017-07-12 DIAGNOSIS — E785 Hyperlipidemia, unspecified: Secondary | ICD-10-CM | POA: Insufficient documentation

## 2017-07-12 DIAGNOSIS — W19XXXA Unspecified fall, initial encounter: Secondary | ICD-10-CM

## 2017-07-12 DIAGNOSIS — Y939 Activity, unspecified: Secondary | ICD-10-CM | POA: Diagnosis not present

## 2017-07-12 DIAGNOSIS — Y929 Unspecified place or not applicable: Secondary | ICD-10-CM | POA: Insufficient documentation

## 2017-07-12 DIAGNOSIS — D649 Anemia, unspecified: Secondary | ICD-10-CM | POA: Diagnosis not present

## 2017-07-12 DIAGNOSIS — W0110XA Fall on same level from slipping, tripping and stumbling with subsequent striking against unspecified object, initial encounter: Secondary | ICD-10-CM | POA: Diagnosis not present

## 2017-07-12 DIAGNOSIS — N2581 Secondary hyperparathyroidism of renal origin: Secondary | ICD-10-CM | POA: Diagnosis not present

## 2017-07-12 DIAGNOSIS — Z79899 Other long term (current) drug therapy: Secondary | ICD-10-CM | POA: Insufficient documentation

## 2017-07-12 DIAGNOSIS — I120 Hypertensive chronic kidney disease with stage 5 chronic kidney disease or end stage renal disease: Secondary | ICD-10-CM | POA: Diagnosis not present

## 2017-07-12 DIAGNOSIS — Z23 Encounter for immunization: Secondary | ICD-10-CM | POA: Diagnosis not present

## 2017-07-12 DIAGNOSIS — N186 End stage renal disease: Secondary | ICD-10-CM | POA: Insufficient documentation

## 2017-07-12 DIAGNOSIS — Y998 Other external cause status: Secondary | ICD-10-CM | POA: Insufficient documentation

## 2017-07-12 LAB — CBC
HCT: 24.8 % — ABNORMAL LOW (ref 36.0–46.0)
Hemoglobin: 8.1 g/dL — ABNORMAL LOW (ref 12.0–15.0)
MCH: 33.5 pg (ref 26.0–34.0)
MCHC: 32.7 g/dL (ref 30.0–36.0)
MCV: 102.5 fL — AB (ref 78.0–100.0)
PLATELETS: 316 10*3/uL (ref 150–400)
RBC: 2.42 MIL/uL — ABNORMAL LOW (ref 3.87–5.11)
RDW: 14.8 % (ref 11.5–15.5)
WBC: 7.8 10*3/uL (ref 4.0–10.5)

## 2017-07-12 LAB — BASIC METABOLIC PANEL
Anion gap: 15 (ref 5–15)
BUN: 37 mg/dL — AB (ref 6–20)
CHLORIDE: 97 mmol/L — AB (ref 101–111)
CO2: 24 mmol/L (ref 22–32)
Calcium: 9.9 mg/dL (ref 8.9–10.3)
Creatinine, Ser: 11.29 mg/dL — ABNORMAL HIGH (ref 0.44–1.00)
GFR calc Af Amer: 4 mL/min — ABNORMAL LOW (ref >60)
GFR calc non Af Amer: 3 mL/min — ABNORMAL LOW (ref >60)
Glucose, Bld: 112 mg/dL — ABNORMAL HIGH (ref 65–99)
Potassium: 4.4 mmol/L (ref 3.5–5.1)
SODIUM: 136 mmol/L (ref 135–145)

## 2017-07-12 LAB — POC OCCULT BLOOD, ED: Fecal Occult Bld: NEGATIVE

## 2017-07-12 LAB — CBG MONITORING, ED: Glucose-Capillary: 112 mg/dL — ABNORMAL HIGH (ref 65–99)

## 2017-07-12 MED ORDER — SODIUM CHLORIDE 0.9 % IV BOLUS (SEPSIS)
1000.0000 mL | Freq: Once | INTRAVENOUS | Status: AC
  Administered 2017-07-12: 1000 mL via INTRAVENOUS

## 2017-07-12 NOTE — ED Triage Notes (Signed)
Per family pt fell backwards down 4-5 steps. Pt has hx of orthostatic hypotension. Denies LOC, denies thinners. Pt has hematoma with bleeding to back of head. Pt is oriented at baseline.   Pt BP soft in triage.

## 2017-07-12 NOTE — ED Notes (Signed)
Pt maintains neurological baseline. No neurological deficits new or changed at this time.

## 2017-07-12 NOTE — Discharge Instructions (Signed)
You need to follow-up with your nephrologist and have your hemoglobin rechecked. Return to the emergency department for any new or worsening symptoms.  Please ensure that ensure that you are drinking enough water.

## 2017-07-12 NOTE — ED Provider Notes (Signed)
[facility] DEPT Provider Note   CSN: [id number] Arrival date & time: 07/12/17  0003     History   Chief Complaint Chief Complaint  Patient presents with  . Fall  . Head Injury    HPI Jane [name] is a 51 y.o. female.  Patient with history of ESRD on peritoneal dialysis, mental retardation, schizophrenia, hypertension, hyperlipidemia, and bipolar presents to the emergency department with chief complaint of fall. She states that she became lightheaded and fell backwards striking her head earlier this evening.  She is accompanied by caregivers.  She denies any chest pain or shortness breath. She denies any nausea, or vomiting. She denies any new or worsening abdominal pain. She denies any other associated symptoms.   The history is provided by the patient. No language interpreter was used.    Past Medical History:  Diagnosis Date  . Anemia   . Bipolar disorder ([place])   . Depression   . ESRD on peritoneal dialysis ([place])   . Hyperlipemia   . Hypertension   . Hypothyroidism   . Moderately mentally retarded   . Renal disease   . Schizophrenia ([place])   . Seizures ([place])    No recent seizures - no meds    Patient Active Problem List   Diagnosis Date Noted  . Seizure disorder ([place]) 04/22/2016  . Acute encephalopathy 04/22/2016  . End-stage renal disease on peritoneal dialysis ([place])   . Partial seizure ([place])   . Seizure ([place]) 09/14/2015  . Altered mental status 07/15/2012  . Hepatic encephalopathy ([place]) 07/15/2012  . Essential hypertension 10/11/2010  . FOOT PAIN, RIGHT 10/11/2010  . BRONCHITIS, ACUTE WITH MILD BRONCHOSPASM 10/04/2010  . ANKLE SPRAIN, RIGHT 02/20/2010  . AGITATION 06/17/2009  . WEIGHT GAIN 06/17/2009  . Dermatophytosis of foot 04/16/2009  . VAGINAL DISCHARGE 04/16/2009  . INSOMNIA 04/16/2009  . POLYDIPSIA 04/16/2009  . FREQUENCY, URINARY 04/16/2009  . ANEMIA, IRON DEFICIENCY 12/03/2007  . SEIZURE DISORDER 09/03/2007  . DYSLIPIDEMIA 06/11/2007   . OBESITY NOS 06/11/2007  . Schizoaffective disorder ([place]) 06/11/2007  . DISORDER, BIPOLAR NOS 06/11/2007  . RETARDATION, MENTAL NOS 06/11/2007  . RENAL INSUFFICIENCY, CHRONIC 06/11/2007  . ACNE NEC 06/11/2007  . URINARY INCONTINENCE, URGE 06/11/2007  . REDUCTION MAMMOPLASTY, HX OF 06/11/2007    Past Surgical History:  Procedure Laterality Date  . BREAST REDUCTION SURGERY    . BREAST SURGERY     breast reduction  . EXAMINATION UNDER ANESTHESIA N/A 12/08/2013   Procedure: EXAM UNDER ANESTHESIA;  Surgeon: Lavonia Drafts, MD;  Location: [facility];  Service: Gynecology;  Laterality: N/A;  Pelvic exam and pap smear, unable to tolerate during last office visit  . FOOT SURGERY     right    OB History    Gravida Para Term Preterm AB Living   0 0 0 0 0 0   SAB TAB Ectopic Multiple Live Births   0 0 0 0         Home Medications    Prior to Admission medications   Medication Sig Start Date End Date Taking? Authorizing Provider  B Complex-C-Folic Acid (DIALYVITE 852) 0.8 MG TABS Take 1 tablet by mouth daily.   Yes [provider]  benztropine (COGENTIN) 1 MG tablet Take 1 mg by mouth at bedtime.   Yes [provider]  calcitRIOL (ROCALTROL) 0.25 MCG capsule Take 0.25 mcg by mouth daily.   Yes [provider]  cinacalcet (SENSIPAR) 30 MG tablet Take 30-60 mg by mouth See admin  instructions. Take 1 tablet every morning and take 2 tablets every evening   Yes [provider]  divalproex (DEPAKOTE) 500 MG DR tablet Take 500 mg by mouth 2 (two) times daily.   Yes [provider]  iron polysaccharides (NIFEREX) 150 MG capsule Take 150 mg by mouth every other day.   Yes [provider]  levETIRAcetam (KEPPRA) 500 MG tablet Take 1 tablet (500 mg total) by mouth 2 (two) times daily. 09/17/15  Yes Charlynne Cousins, MD  medroxyPROGESTERone (DEPO-PROVERA) 150 MG/ML injection Inject 150 mg into the muscle every 3 (three) months.    Yes  [provider]  potassium chloride SA (K-DUR,KLOR-CON) 20 MEQ tablet Take 20 mEq by mouth daily.   Yes [provider]  risperiDONE (RISPERDAL) 2 MG tablet Take 2 mg by mouth at bedtime.   Yes [provider]  sevelamer carbonate (RENVELA) 800 MG tablet Take 800 mg by mouth 3 (three) times daily with meals.   Yes [provider]  vitamin B-12 (CYANOCOBALAMIN) 500 MCG tablet Take 500 mcg by mouth daily.   Yes [provider]  dicyclomine (BENTYL) 20 MG tablet Take 1 tablet (20 mg total) by mouth 2 (two) times daily. Patient not taking: Reported on 07/12/2017 06/21/16   Delsa Grana, PA-C  divalproex (DEPAKOTE) 250 MG DR tablet Take 3 tablets (750 mg total) by mouth every 12 (twelve) hours. Patient not taking: Reported on 07/12/2017 04/22/16   Caren Griffins, MD  ondansetron (ZOFRAN ODT) 4 MG disintegrating tablet Take 1 tablet (4 mg total) by mouth every 8 (eight) hours as needed for nausea or vomiting. Patient not taking: Reported on 07/12/2017 06/21/16   Delsa Grana, PA-C    Family History Family History  Problem Relation Age of Onset  . Hypertension Other     Social History Social History  Substance Use Topics  . Smoking status: Never Smoker  . Smokeless tobacco: Never Used  . Alcohol use No     Allergies   Patient has no known allergies.   Review of Systems Review of Systems  All other systems reviewed and are negative.    Physical Exam Updated Vital Signs BP (!) 121/94   Pulse 95   Temp 98.4 F (36.9 C) (Oral)   Resp (!) 21   Ht 5\' 3"  (1.6 m)   Wt 72.6 kg (160 lb)   SpO2 100%   BMI 28.34 kg/m   Physical Exam  Constitutional: She appears well-developed and well-nourished.  HENT:  Head: Normocephalic and atraumatic.  Minor scalp injury, no laceration requiring repair No battle sign, no raccoon's eyes  Eyes: Pupils are equal, round, and reactive to light. Conjunctivae and EOM are normal.  Neck: Normal range of  motion. Neck supple.  Cardiovascular: Normal rate and regular rhythm.  Exam reveals no gallop and no friction rub.   No murmur heard. Pulmonary/Chest: Effort normal and breath sounds normal. No respiratory distress. She has no wheezes. She has no rales. She exhibits no tenderness.  Abdominal: Soft. Bowel sounds are normal. She exhibits no distension and no mass. There is no tenderness. There is no rebound and no guarding.  No focal abdominal tenderness, no RLQ tenderness or pain at McBurney's point, no RUQ tenderness or Murphy's sign, no left-sided abdominal tenderness, no fluid wave, or signs of peritonitis   Musculoskeletal: Normal range of motion. She exhibits no edema or tenderness.  Neurological: She is alert.  Baseline mental status per family members  Skin: Skin is  warm and dry.  Psychiatric: She has a normal mood and affect.  Nursing note and vitals reviewed.    ED Treatments / Results  Labs (all labs ordered are listed, but only abnormal results are displayed) Labs Reviewed  BASIC METABOLIC PANEL - Abnormal; Notable for the following:       Result Value   Chloride 97 (*)    Glucose, Bld 112 (*)    BUN 37 (*)    Creatinine, Ser 11.29 (*)    GFR calc non Af Amer 3 (*)    GFR calc Af Amer 4 (*)    All other components within normal limits  CBC - Abnormal; Notable for the following:    RBC 2.42 (*)    Hemoglobin 8.1 (*)    HCT 24.8 (*)    MCV 102.5 (*)    All other components within normal limits  CBG MONITORING, ED - Abnormal; Notable for the following:    Glucose-Capillary 112 (*)    All other components within normal limits  POC OCCULT BLOOD, ED    EKG  EKG Interpretation  Date/Time:  Thursday July 12 2017 00:26:54 EDT Ventricular Rate:  105 PR Interval:  128 QRS Duration: 78 QT Interval:  302 QTC Calculation: 399 R Axis:   -15 Text Interpretation:  Sinus tachycardia Cannot rule out Anterior infarct , age undetermined Abnormal ECG No significant change  since last tracing Confirmed by Pryor Curia [id number]) on 07/12/2017 2:36:04 AM       Radiology Ct Head Wo Contrast  Result Date: 07/12/2017 CLINICAL DATA:  Patient fell backwards down 5 steps. Head trauma. Hematoma with bleeding to the back of the head. History of ortho static hypotension, hypertension, and seizures. EXAM: CT HEAD WITHOUT CONTRAST TECHNIQUE: Contiguous axial images were obtained from the base of the skull through the vertex without intravenous contrast. COMPARISON:  None. FINDINGS: Brain: No evidence of acute infarction, hemorrhage, hydrocephalus, extra-axial collection or mass lesion/mass effect. Vascular: No hyperdense vessel or unexpected calcification. Skull: The calvarium appears intact. Sinuses/Orbits: Paranasal sinuses and mastoid air cells are clear. Other: None. IMPRESSION: No acute intracranial abnormalities. Electronically Signed   By: Lucienne Capers M.D.   On: 07/12/2017 01:22    Procedures Procedures (including critical care time)  Medications Ordered in ED Medications  sodium chloride 0.9 % bolus 1,000 mL (0 mLs Intravenous Stopped 07/12/17 0609)     Initial Impression / Assessment and Plan / ED Course  I have reviewed the triage vital signs and the nursing notes.  Pertinent labs & imaging results that were available during my care of the patient were reviewed by me and considered in my medical decision making (see chart for details).     Patient with fall and head injury. CT scan is negative. She has some minor scalp injury, but nothing severe enough to require closure with staples or suture. No active bleeding. Patient is at baseline mental status. The patient's family member states that she has not been drinking enough water. Her blood pressure was initially in the upper 60A systolic on arrival. She was given a liter of fluid, and this resolved. She is not orthostatic anymore. Her hemoglobin is 8.1 which is a little lower than normal for her, but she  denies any bleeding. Rectal exam is negative for blood, Hemoccult negative.patient has no focal abdominal tenderness.  Patient seen by and discussed with Dr. Christy Gentles, who agrees with plan for discharge to home.  Patient will follow-up with her nephrologist and have  her hemoglobin rechecked to ensure that it is not down trending.  Final Clinical Impressions(s) / ED Diagnoses   Final diagnoses:  Injury of head, initial encounter  Fall, initial encounter  Orthostatic hypotension  Anemia, unspecified type    New Prescriptions New Prescriptions   No medications on file     Montine Circle, Hershal Coria 07/12/17 [id number]    Ripley Fraise, MD 07/15/17 [phone]

## 2017-07-13 DIAGNOSIS — D631 Anemia in chronic kidney disease: Secondary | ICD-10-CM | POA: Diagnosis not present

## 2017-07-13 DIAGNOSIS — I10 Essential (primary) hypertension: Secondary | ICD-10-CM | POA: Diagnosis not present

## 2017-07-13 DIAGNOSIS — E785 Hyperlipidemia, unspecified: Secondary | ICD-10-CM | POA: Diagnosis not present

## 2017-07-13 DIAGNOSIS — N186 End stage renal disease: Secondary | ICD-10-CM | POA: Diagnosis not present

## 2017-07-13 DIAGNOSIS — R062 Wheezing: Secondary | ICD-10-CM | POA: Diagnosis not present

## 2017-07-13 DIAGNOSIS — I119 Hypertensive heart disease without heart failure: Secondary | ICD-10-CM | POA: Diagnosis not present

## 2017-07-13 DIAGNOSIS — D539 Nutritional anemia, unspecified: Secondary | ICD-10-CM | POA: Diagnosis not present

## 2017-07-13 DIAGNOSIS — Z9189 Other specified personal risk factors, not elsewhere classified: Secondary | ICD-10-CM | POA: Diagnosis not present

## 2017-07-13 DIAGNOSIS — Z23 Encounter for immunization: Secondary | ICD-10-CM | POA: Diagnosis not present

## 2017-07-13 DIAGNOSIS — Z30019 Encounter for initial prescription of contraceptives, unspecified: Secondary | ICD-10-CM | POA: Diagnosis not present

## 2017-07-13 DIAGNOSIS — N2581 Secondary hyperparathyroidism of renal origin: Secondary | ICD-10-CM | POA: Diagnosis not present

## 2017-07-13 DIAGNOSIS — R569 Unspecified convulsions: Secondary | ICD-10-CM | POA: Diagnosis not present

## 2017-07-13 DIAGNOSIS — E039 Hypothyroidism, unspecified: Secondary | ICD-10-CM | POA: Diagnosis not present

## 2017-07-14 DIAGNOSIS — Z23 Encounter for immunization: Secondary | ICD-10-CM | POA: Diagnosis not present

## 2017-07-14 DIAGNOSIS — N2581 Secondary hyperparathyroidism of renal origin: Secondary | ICD-10-CM | POA: Diagnosis not present

## 2017-07-14 DIAGNOSIS — N186 End stage renal disease: Secondary | ICD-10-CM | POA: Diagnosis not present

## 2017-07-14 DIAGNOSIS — D631 Anemia in chronic kidney disease: Secondary | ICD-10-CM | POA: Diagnosis not present

## 2017-07-15 DIAGNOSIS — N186 End stage renal disease: Secondary | ICD-10-CM | POA: Diagnosis not present

## 2017-07-15 DIAGNOSIS — N2581 Secondary hyperparathyroidism of renal origin: Secondary | ICD-10-CM | POA: Diagnosis not present

## 2017-07-15 DIAGNOSIS — D631 Anemia in chronic kidney disease: Secondary | ICD-10-CM | POA: Diagnosis not present

## 2017-07-15 DIAGNOSIS — Z23 Encounter for immunization: Secondary | ICD-10-CM | POA: Diagnosis not present

## 2017-07-16 DIAGNOSIS — D631 Anemia in chronic kidney disease: Secondary | ICD-10-CM | POA: Diagnosis not present

## 2017-07-16 DIAGNOSIS — N2581 Secondary hyperparathyroidism of renal origin: Secondary | ICD-10-CM | POA: Diagnosis not present

## 2017-07-16 DIAGNOSIS — N186 End stage renal disease: Secondary | ICD-10-CM | POA: Diagnosis not present

## 2017-07-16 DIAGNOSIS — Z23 Encounter for immunization: Secondary | ICD-10-CM | POA: Diagnosis not present

## 2017-07-17 DIAGNOSIS — N2581 Secondary hyperparathyroidism of renal origin: Secondary | ICD-10-CM | POA: Diagnosis not present

## 2017-07-17 DIAGNOSIS — N186 End stage renal disease: Secondary | ICD-10-CM | POA: Diagnosis not present

## 2017-07-17 DIAGNOSIS — D631 Anemia in chronic kidney disease: Secondary | ICD-10-CM | POA: Diagnosis not present

## 2017-07-17 DIAGNOSIS — Z23 Encounter for immunization: Secondary | ICD-10-CM | POA: Diagnosis not present

## 2017-07-18 DIAGNOSIS — N186 End stage renal disease: Secondary | ICD-10-CM | POA: Diagnosis not present

## 2017-07-18 DIAGNOSIS — Z23 Encounter for immunization: Secondary | ICD-10-CM | POA: Diagnosis not present

## 2017-07-18 DIAGNOSIS — N2581 Secondary hyperparathyroidism of renal origin: Secondary | ICD-10-CM | POA: Diagnosis not present

## 2017-07-18 DIAGNOSIS — D631 Anemia in chronic kidney disease: Secondary | ICD-10-CM | POA: Diagnosis not present

## 2017-07-19 DIAGNOSIS — D631 Anemia in chronic kidney disease: Secondary | ICD-10-CM | POA: Diagnosis not present

## 2017-07-19 DIAGNOSIS — N2581 Secondary hyperparathyroidism of renal origin: Secondary | ICD-10-CM | POA: Diagnosis not present

## 2017-07-19 DIAGNOSIS — Z23 Encounter for immunization: Secondary | ICD-10-CM | POA: Diagnosis not present

## 2017-07-19 DIAGNOSIS — N186 End stage renal disease: Secondary | ICD-10-CM | POA: Diagnosis not present

## 2017-07-20 DIAGNOSIS — D631 Anemia in chronic kidney disease: Secondary | ICD-10-CM | POA: Diagnosis not present

## 2017-07-20 DIAGNOSIS — Z23 Encounter for immunization: Secondary | ICD-10-CM | POA: Diagnosis not present

## 2017-07-20 DIAGNOSIS — E785 Hyperlipidemia, unspecified: Secondary | ICD-10-CM | POA: Diagnosis not present

## 2017-07-20 DIAGNOSIS — I953 Hypotension of hemodialysis: Secondary | ICD-10-CM | POA: Diagnosis not present

## 2017-07-20 DIAGNOSIS — E039 Hypothyroidism, unspecified: Secondary | ICD-10-CM | POA: Diagnosis not present

## 2017-07-20 DIAGNOSIS — R569 Unspecified convulsions: Secondary | ICD-10-CM | POA: Diagnosis not present

## 2017-07-20 DIAGNOSIS — D539 Nutritional anemia, unspecified: Secondary | ICD-10-CM | POA: Diagnosis not present

## 2017-07-20 DIAGNOSIS — Z9189 Other specified personal risk factors, not elsewhere classified: Secondary | ICD-10-CM | POA: Diagnosis not present

## 2017-07-20 DIAGNOSIS — N186 End stage renal disease: Secondary | ICD-10-CM | POA: Diagnosis not present

## 2017-07-20 DIAGNOSIS — N2581 Secondary hyperparathyroidism of renal origin: Secondary | ICD-10-CM | POA: Diagnosis not present

## 2017-07-21 DIAGNOSIS — D631 Anemia in chronic kidney disease: Secondary | ICD-10-CM | POA: Diagnosis not present

## 2017-07-21 DIAGNOSIS — Z23 Encounter for immunization: Secondary | ICD-10-CM | POA: Diagnosis not present

## 2017-07-21 DIAGNOSIS — N186 End stage renal disease: Secondary | ICD-10-CM | POA: Diagnosis not present

## 2017-07-21 DIAGNOSIS — N2581 Secondary hyperparathyroidism of renal origin: Secondary | ICD-10-CM | POA: Diagnosis not present

## 2017-07-22 DIAGNOSIS — D631 Anemia in chronic kidney disease: Secondary | ICD-10-CM | POA: Diagnosis not present

## 2017-07-22 DIAGNOSIS — N2581 Secondary hyperparathyroidism of renal origin: Secondary | ICD-10-CM | POA: Diagnosis not present

## 2017-07-22 DIAGNOSIS — Z992 Dependence on renal dialysis: Secondary | ICD-10-CM | POA: Diagnosis not present

## 2017-07-22 DIAGNOSIS — N186 End stage renal disease: Secondary | ICD-10-CM | POA: Diagnosis not present

## 2017-07-22 DIAGNOSIS — Z23 Encounter for immunization: Secondary | ICD-10-CM | POA: Diagnosis not present

## 2017-07-23 DIAGNOSIS — N2581 Secondary hyperparathyroidism of renal origin: Secondary | ICD-10-CM | POA: Diagnosis not present

## 2017-07-23 DIAGNOSIS — N186 End stage renal disease: Secondary | ICD-10-CM | POA: Diagnosis not present

## 2017-07-23 DIAGNOSIS — Z23 Encounter for immunization: Secondary | ICD-10-CM | POA: Diagnosis not present

## 2017-07-23 DIAGNOSIS — D631 Anemia in chronic kidney disease: Secondary | ICD-10-CM | POA: Diagnosis not present

## 2017-07-24 DIAGNOSIS — D631 Anemia in chronic kidney disease: Secondary | ICD-10-CM | POA: Diagnosis not present

## 2017-07-24 DIAGNOSIS — Z23 Encounter for immunization: Secondary | ICD-10-CM | POA: Diagnosis not present

## 2017-07-24 DIAGNOSIS — N186 End stage renal disease: Secondary | ICD-10-CM | POA: Diagnosis not present

## 2017-07-24 DIAGNOSIS — N2581 Secondary hyperparathyroidism of renal origin: Secondary | ICD-10-CM | POA: Diagnosis not present

## 2017-07-25 ENCOUNTER — Encounter: Payer: Self-pay | Admitting: Pulmonary Disease

## 2017-07-25 ENCOUNTER — Ambulatory Visit (INDEPENDENT_AMBULATORY_CARE_PROVIDER_SITE_OTHER): Payer: Medicare Other | Admitting: Pulmonary Disease

## 2017-07-25 DIAGNOSIS — N2581 Secondary hyperparathyroidism of renal origin: Secondary | ICD-10-CM | POA: Diagnosis not present

## 2017-07-25 DIAGNOSIS — G4733 Obstructive sleep apnea (adult) (pediatric): Secondary | ICD-10-CM | POA: Insufficient documentation

## 2017-07-25 DIAGNOSIS — Z23 Encounter for immunization: Secondary | ICD-10-CM | POA: Diagnosis not present

## 2017-07-25 DIAGNOSIS — N186 End stage renal disease: Secondary | ICD-10-CM | POA: Diagnosis not present

## 2017-07-25 DIAGNOSIS — D631 Anemia in chronic kidney disease: Secondary | ICD-10-CM | POA: Diagnosis not present

## 2017-07-25 NOTE — Assessment & Plan Note (Signed)
Schedule home sleep study. Based on this we will get you started on a CPAP machine with a full face mask. She will also need a CPAP titration study given medications that she is on to make sure that she does not have central apneas.  Weight loss encouraged, compliance with goal of at least 4-6 hrs every night is the expectation. Advised against medications with sedative side effects Cautioned against driving when sleepy - understanding that sleepiness will vary on a day to day basis

## 2017-07-25 NOTE — Patient Instructions (Signed)
Schedule home sleep study. Based on this we will get you started on a CPAP machine

## 2017-07-25 NOTE — Progress Notes (Signed)
Subjective:    Patient ID: Jane [name], female    DOB: 1966/03/13, 51 y.o.   MRN: [id number]  HPI  51 year old woman with end-stage renal disease presents for evaluation of sleep-disordered breathing. She has what seems to be schizophrenia since childhood and has been mentally disabled, used to live in a group home but now lives with her sister for the last 7 years. She is accompanied by her brother-in-law Jane [name] today who takes care of her MD visits. She undergoes peritoneal dialysis about 4 times daily including one exchange at night. She underwent in [facility] in 5/99 when her weight was 2:30 pounds. This showed RDI of 62 /hour, she was started on CPAP with full face mask with good improvement in her daytime somnolence and fatigue.  However since she moved in with her sister 7 years ago her machine was broken and she has not been using this at all. Caregiver reports increased sleepiness. Epworth sleepiness score is 14. He also reports increased shortness of breath and she intermittently uses albuterol MDI with some benefit however he feels that this is more related to her being sedentary most of the time.  Bedtime is between 9 and 10 PM, sleep latency is about 30 minutes, she sleeps on her side with 2 pillows, reports one nocturnal awakening and is out of bed at 7 AM feeling tired with dryness of mouth and occasional headache.  She has lost significant weight since starting dialysis down to her current weight of 170 pounds- about 60 pound weight loss since her original study. Medications reviewed and this includes anticonvulsant such as Keppra and valproate and risperidone She undergoes treatment at [facility]  Chest x-ray 03/2016 was reviewed which shows bibasal atelectasis    Past Medical History:  Diagnosis Date  . Anemia   . Bipolar disorder ([place])   . Depression   . ESRD on peritoneal dialysis ([place])   . Hyperlipemia   . Hypertension   . Hypothyroidism   . Moderately  mentally retarded   . Renal disease   . Schizophrenia ([place])   . Seizures ([place])    No recent seizures - no meds     Past Surgical History:  Procedure Laterality Date  . BREAST REDUCTION SURGERY    . BREAST SURGERY     breast reduction  . EXAMINATION UNDER ANESTHESIA N/A 12/08/2013   Procedure: EXAM UNDER ANESTHESIA;  Surgeon: Lavonia Drafts, MD;  Location: [facility];  Service: Gynecology;  Laterality: N/A;  Pelvic exam and pap smear, unable to tolerate during last office visit  . FOOT SURGERY     right    No Known Allergies  Social History   Social History  . Marital status: Single    Spouse name: N/A  . Number of children: N/A  . Years of education: N/A   Occupational History  . Not on file.   Social History Main Topics  . Smoking status: Never Smoker  . Smokeless tobacco: Never Used  . Alcohol use No  . Drug use: No  . Sexual activity: No   Other Topics Concern  . Not on file   Social History Narrative  . No narrative on file      Family History  Problem Relation Age of Onset  . Hypertension Other      Review of Systems   Positive for increased somnolence, mild snoring  Constitutional: negative for anorexia, fevers and sweats  Eyes: negative for irritation, redness and visual disturbance  Ears, nose,  mouth, throat, and face: negative for earaches, epistaxis, nasal congestion and sore throat  Respiratory: negative for cough, , sputum and wheezing  Cardiovascular: negative for chest pain,  lower extremity edema, orthopnea, palpitations and syncope  Gastrointestinal: negative for abdominal pain, constipation, diarrhea, melena, nausea and vomiting  Genitourinary:negative for dysuria, frequency and hematuria  Hematologic/lymphatic: negative for bleeding, easy bruising and lymphadenopathy  Musculoskeletal:negative for arthralgias, muscle weakness and stiff joints  Neurological: negative for coordination problems, gait problems, headaches and weakness    Endocrine: negative for diabetic symptoms including polydipsia, polyuria and weight loss     Objective:   Physical Exam  Gen. Pleasant, well-nourished, in no distress, flat affect ENT - nasal congestion, no post nasal drip Neck: No JVD, no thyromegaly, no carotid bruits Lungs: no use of accessory muscles, no dullness to percussion, clear without rales or rhonchi  Cardiovascular: Rhythm regular, heart sounds  normal, no murmurs or gallops, no peripheral edema Abdomen: soft and non-tender, no hepatosplenomegaly, BS normal. Musculoskeletal: No deformities, no cyanosis or clubbing, halting gait Neuro:  alert, non focal ,no cog wheel rigidity       Assessment & Plan:

## 2017-07-26 DIAGNOSIS — Z23 Encounter for immunization: Secondary | ICD-10-CM | POA: Diagnosis not present

## 2017-07-26 DIAGNOSIS — N186 End stage renal disease: Secondary | ICD-10-CM | POA: Diagnosis not present

## 2017-07-26 DIAGNOSIS — N2581 Secondary hyperparathyroidism of renal origin: Secondary | ICD-10-CM | POA: Diagnosis not present

## 2017-07-26 DIAGNOSIS — D631 Anemia in chronic kidney disease: Secondary | ICD-10-CM | POA: Diagnosis not present

## 2017-07-27 DIAGNOSIS — Z23 Encounter for immunization: Secondary | ICD-10-CM | POA: Diagnosis not present

## 2017-07-27 DIAGNOSIS — D631 Anemia in chronic kidney disease: Secondary | ICD-10-CM | POA: Diagnosis not present

## 2017-07-27 DIAGNOSIS — N2581 Secondary hyperparathyroidism of renal origin: Secondary | ICD-10-CM | POA: Diagnosis not present

## 2017-07-27 DIAGNOSIS — N186 End stage renal disease: Secondary | ICD-10-CM | POA: Diagnosis not present

## 2017-07-28 DIAGNOSIS — N2581 Secondary hyperparathyroidism of renal origin: Secondary | ICD-10-CM | POA: Diagnosis not present

## 2017-07-28 DIAGNOSIS — Z23 Encounter for immunization: Secondary | ICD-10-CM | POA: Diagnosis not present

## 2017-07-28 DIAGNOSIS — N186 End stage renal disease: Secondary | ICD-10-CM | POA: Diagnosis not present

## 2017-07-28 DIAGNOSIS — D631 Anemia in chronic kidney disease: Secondary | ICD-10-CM | POA: Diagnosis not present

## 2017-07-29 DIAGNOSIS — N2581 Secondary hyperparathyroidism of renal origin: Secondary | ICD-10-CM | POA: Diagnosis not present

## 2017-07-29 DIAGNOSIS — N186 End stage renal disease: Secondary | ICD-10-CM | POA: Diagnosis not present

## 2017-07-29 DIAGNOSIS — D631 Anemia in chronic kidney disease: Secondary | ICD-10-CM | POA: Diagnosis not present

## 2017-07-29 DIAGNOSIS — Z23 Encounter for immunization: Secondary | ICD-10-CM | POA: Diagnosis not present

## 2017-07-30 DIAGNOSIS — D631 Anemia in chronic kidney disease: Secondary | ICD-10-CM | POA: Diagnosis not present

## 2017-07-30 DIAGNOSIS — N2581 Secondary hyperparathyroidism of renal origin: Secondary | ICD-10-CM | POA: Diagnosis not present

## 2017-07-30 DIAGNOSIS — Z23 Encounter for immunization: Secondary | ICD-10-CM | POA: Diagnosis not present

## 2017-07-30 DIAGNOSIS — N186 End stage renal disease: Secondary | ICD-10-CM | POA: Diagnosis not present

## 2017-07-31 DIAGNOSIS — Z23 Encounter for immunization: Secondary | ICD-10-CM | POA: Diagnosis not present

## 2017-07-31 DIAGNOSIS — N2581 Secondary hyperparathyroidism of renal origin: Secondary | ICD-10-CM | POA: Diagnosis not present

## 2017-07-31 DIAGNOSIS — D631 Anemia in chronic kidney disease: Secondary | ICD-10-CM | POA: Diagnosis not present

## 2017-07-31 DIAGNOSIS — N186 End stage renal disease: Secondary | ICD-10-CM | POA: Diagnosis not present

## 2017-08-01 DIAGNOSIS — N186 End stage renal disease: Secondary | ICD-10-CM | POA: Diagnosis not present

## 2017-08-01 DIAGNOSIS — N2581 Secondary hyperparathyroidism of renal origin: Secondary | ICD-10-CM | POA: Diagnosis not present

## 2017-08-01 DIAGNOSIS — Z23 Encounter for immunization: Secondary | ICD-10-CM | POA: Diagnosis not present

## 2017-08-01 DIAGNOSIS — D631 Anemia in chronic kidney disease: Secondary | ICD-10-CM | POA: Diagnosis not present

## 2017-08-02 DIAGNOSIS — Z23 Encounter for immunization: Secondary | ICD-10-CM | POA: Diagnosis not present

## 2017-08-02 DIAGNOSIS — N2581 Secondary hyperparathyroidism of renal origin: Secondary | ICD-10-CM | POA: Diagnosis not present

## 2017-08-02 DIAGNOSIS — N186 End stage renal disease: Secondary | ICD-10-CM | POA: Diagnosis not present

## 2017-08-02 DIAGNOSIS — D631 Anemia in chronic kidney disease: Secondary | ICD-10-CM | POA: Diagnosis not present

## 2017-08-03 DIAGNOSIS — N2581 Secondary hyperparathyroidism of renal origin: Secondary | ICD-10-CM | POA: Diagnosis not present

## 2017-08-03 DIAGNOSIS — Z23 Encounter for immunization: Secondary | ICD-10-CM | POA: Diagnosis not present

## 2017-08-03 DIAGNOSIS — D631 Anemia in chronic kidney disease: Secondary | ICD-10-CM | POA: Diagnosis not present

## 2017-08-03 DIAGNOSIS — N186 End stage renal disease: Secondary | ICD-10-CM | POA: Diagnosis not present

## 2017-08-04 DIAGNOSIS — Z23 Encounter for immunization: Secondary | ICD-10-CM | POA: Diagnosis not present

## 2017-08-04 DIAGNOSIS — N2581 Secondary hyperparathyroidism of renal origin: Secondary | ICD-10-CM | POA: Diagnosis not present

## 2017-08-04 DIAGNOSIS — N186 End stage renal disease: Secondary | ICD-10-CM | POA: Diagnosis not present

## 2017-08-04 DIAGNOSIS — D631 Anemia in chronic kidney disease: Secondary | ICD-10-CM | POA: Diagnosis not present

## 2017-08-05 DIAGNOSIS — Z23 Encounter for immunization: Secondary | ICD-10-CM | POA: Diagnosis not present

## 2017-08-05 DIAGNOSIS — N2581 Secondary hyperparathyroidism of renal origin: Secondary | ICD-10-CM | POA: Diagnosis not present

## 2017-08-05 DIAGNOSIS — D631 Anemia in chronic kidney disease: Secondary | ICD-10-CM | POA: Diagnosis not present

## 2017-08-05 DIAGNOSIS — N186 End stage renal disease: Secondary | ICD-10-CM | POA: Diagnosis not present

## 2017-08-06 DIAGNOSIS — N186 End stage renal disease: Secondary | ICD-10-CM | POA: Diagnosis not present

## 2017-08-06 DIAGNOSIS — Z23 Encounter for immunization: Secondary | ICD-10-CM | POA: Diagnosis not present

## 2017-08-06 DIAGNOSIS — D631 Anemia in chronic kidney disease: Secondary | ICD-10-CM | POA: Diagnosis not present

## 2017-08-06 DIAGNOSIS — N2581 Secondary hyperparathyroidism of renal origin: Secondary | ICD-10-CM | POA: Diagnosis not present

## 2017-08-07 DIAGNOSIS — R6250 Unspecified lack of expected normal physiological development in childhood: Secondary | ICD-10-CM | POA: Diagnosis not present

## 2017-08-07 DIAGNOSIS — N186 End stage renal disease: Secondary | ICD-10-CM | POA: Diagnosis not present

## 2017-08-07 DIAGNOSIS — N2581 Secondary hyperparathyroidism of renal origin: Secondary | ICD-10-CM | POA: Diagnosis not present

## 2017-08-07 DIAGNOSIS — E782 Mixed hyperlipidemia: Secondary | ICD-10-CM | POA: Diagnosis not present

## 2017-08-07 DIAGNOSIS — Z992 Dependence on renal dialysis: Secondary | ICD-10-CM | POA: Diagnosis not present

## 2017-08-07 DIAGNOSIS — Z23 Encounter for immunization: Secondary | ICD-10-CM | POA: Diagnosis not present

## 2017-08-07 DIAGNOSIS — I120 Hypertensive chronic kidney disease with stage 5 chronic kidney disease or end stage renal disease: Secondary | ICD-10-CM | POA: Diagnosis not present

## 2017-08-07 DIAGNOSIS — E039 Hypothyroidism, unspecified: Secondary | ICD-10-CM | POA: Diagnosis not present

## 2017-08-07 DIAGNOSIS — G40909 Epilepsy, unspecified, not intractable, without status epilepticus: Secondary | ICD-10-CM | POA: Diagnosis not present

## 2017-08-07 DIAGNOSIS — D631 Anemia in chronic kidney disease: Secondary | ICD-10-CM | POA: Diagnosis not present

## 2017-08-07 DIAGNOSIS — G4733 Obstructive sleep apnea (adult) (pediatric): Secondary | ICD-10-CM | POA: Diagnosis not present

## 2017-08-07 DIAGNOSIS — F79 Unspecified intellectual disabilities: Secondary | ICD-10-CM | POA: Diagnosis not present

## 2017-08-07 DIAGNOSIS — E790 Hyperuricemia without signs of inflammatory arthritis and tophaceous disease: Secondary | ICD-10-CM | POA: Diagnosis not present

## 2017-08-08 DIAGNOSIS — Z23 Encounter for immunization: Secondary | ICD-10-CM | POA: Diagnosis not present

## 2017-08-08 DIAGNOSIS — N2581 Secondary hyperparathyroidism of renal origin: Secondary | ICD-10-CM | POA: Diagnosis not present

## 2017-08-08 DIAGNOSIS — N186 End stage renal disease: Secondary | ICD-10-CM | POA: Diagnosis not present

## 2017-08-08 DIAGNOSIS — D631 Anemia in chronic kidney disease: Secondary | ICD-10-CM | POA: Diagnosis not present

## 2017-08-09 DIAGNOSIS — Z23 Encounter for immunization: Secondary | ICD-10-CM | POA: Diagnosis not present

## 2017-08-09 DIAGNOSIS — N186 End stage renal disease: Secondary | ICD-10-CM | POA: Diagnosis not present

## 2017-08-09 DIAGNOSIS — D631 Anemia in chronic kidney disease: Secondary | ICD-10-CM | POA: Diagnosis not present

## 2017-08-09 DIAGNOSIS — N2581 Secondary hyperparathyroidism of renal origin: Secondary | ICD-10-CM | POA: Diagnosis not present

## 2017-08-10 DIAGNOSIS — Z23 Encounter for immunization: Secondary | ICD-10-CM | POA: Diagnosis not present

## 2017-08-10 DIAGNOSIS — N186 End stage renal disease: Secondary | ICD-10-CM | POA: Diagnosis not present

## 2017-08-10 DIAGNOSIS — D631 Anemia in chronic kidney disease: Secondary | ICD-10-CM | POA: Diagnosis not present

## 2017-08-10 DIAGNOSIS — N2581 Secondary hyperparathyroidism of renal origin: Secondary | ICD-10-CM | POA: Diagnosis not present

## 2017-08-11 DIAGNOSIS — D631 Anemia in chronic kidney disease: Secondary | ICD-10-CM | POA: Diagnosis not present

## 2017-08-11 DIAGNOSIS — N2581 Secondary hyperparathyroidism of renal origin: Secondary | ICD-10-CM | POA: Diagnosis not present

## 2017-08-11 DIAGNOSIS — Z23 Encounter for immunization: Secondary | ICD-10-CM | POA: Diagnosis not present

## 2017-08-11 DIAGNOSIS — N186 End stage renal disease: Secondary | ICD-10-CM | POA: Diagnosis not present

## 2017-08-12 DIAGNOSIS — D631 Anemia in chronic kidney disease: Secondary | ICD-10-CM | POA: Diagnosis not present

## 2017-08-12 DIAGNOSIS — N2581 Secondary hyperparathyroidism of renal origin: Secondary | ICD-10-CM | POA: Diagnosis not present

## 2017-08-12 DIAGNOSIS — N186 End stage renal disease: Secondary | ICD-10-CM | POA: Diagnosis not present

## 2017-08-12 DIAGNOSIS — Z23 Encounter for immunization: Secondary | ICD-10-CM | POA: Diagnosis not present

## 2017-08-13 DIAGNOSIS — N186 End stage renal disease: Secondary | ICD-10-CM | POA: Diagnosis not present

## 2017-08-13 DIAGNOSIS — N2581 Secondary hyperparathyroidism of renal origin: Secondary | ICD-10-CM | POA: Diagnosis not present

## 2017-08-13 DIAGNOSIS — D631 Anemia in chronic kidney disease: Secondary | ICD-10-CM | POA: Diagnosis not present

## 2017-08-13 DIAGNOSIS — Z23 Encounter for immunization: Secondary | ICD-10-CM | POA: Diagnosis not present

## 2017-08-14 DIAGNOSIS — N2581 Secondary hyperparathyroidism of renal origin: Secondary | ICD-10-CM | POA: Diagnosis not present

## 2017-08-14 DIAGNOSIS — D631 Anemia in chronic kidney disease: Secondary | ICD-10-CM | POA: Diagnosis not present

## 2017-08-14 DIAGNOSIS — Z23 Encounter for immunization: Secondary | ICD-10-CM | POA: Diagnosis not present

## 2017-08-14 DIAGNOSIS — N186 End stage renal disease: Secondary | ICD-10-CM | POA: Diagnosis not present

## 2017-08-15 DIAGNOSIS — Z23 Encounter for immunization: Secondary | ICD-10-CM | POA: Diagnosis not present

## 2017-08-15 DIAGNOSIS — D631 Anemia in chronic kidney disease: Secondary | ICD-10-CM | POA: Diagnosis not present

## 2017-08-15 DIAGNOSIS — N2581 Secondary hyperparathyroidism of renal origin: Secondary | ICD-10-CM | POA: Diagnosis not present

## 2017-08-15 DIAGNOSIS — N186 End stage renal disease: Secondary | ICD-10-CM | POA: Diagnosis not present

## 2017-08-16 DIAGNOSIS — D631 Anemia in chronic kidney disease: Secondary | ICD-10-CM | POA: Diagnosis not present

## 2017-08-16 DIAGNOSIS — N186 End stage renal disease: Secondary | ICD-10-CM | POA: Diagnosis not present

## 2017-08-16 DIAGNOSIS — N2581 Secondary hyperparathyroidism of renal origin: Secondary | ICD-10-CM | POA: Diagnosis not present

## 2017-08-16 DIAGNOSIS — Z23 Encounter for immunization: Secondary | ICD-10-CM | POA: Diagnosis not present

## 2017-08-17 DIAGNOSIS — D631 Anemia in chronic kidney disease: Secondary | ICD-10-CM | POA: Diagnosis not present

## 2017-08-17 DIAGNOSIS — N2581 Secondary hyperparathyroidism of renal origin: Secondary | ICD-10-CM | POA: Diagnosis not present

## 2017-08-17 DIAGNOSIS — Z23 Encounter for immunization: Secondary | ICD-10-CM | POA: Diagnosis not present

## 2017-08-17 DIAGNOSIS — N186 End stage renal disease: Secondary | ICD-10-CM | POA: Diagnosis not present

## 2017-08-18 DIAGNOSIS — N186 End stage renal disease: Secondary | ICD-10-CM | POA: Diagnosis not present

## 2017-08-18 DIAGNOSIS — D631 Anemia in chronic kidney disease: Secondary | ICD-10-CM | POA: Diagnosis not present

## 2017-08-18 DIAGNOSIS — Z23 Encounter for immunization: Secondary | ICD-10-CM | POA: Diagnosis not present

## 2017-08-18 DIAGNOSIS — N2581 Secondary hyperparathyroidism of renal origin: Secondary | ICD-10-CM | POA: Diagnosis not present

## 2017-08-19 DIAGNOSIS — D631 Anemia in chronic kidney disease: Secondary | ICD-10-CM | POA: Diagnosis not present

## 2017-08-19 DIAGNOSIS — N186 End stage renal disease: Secondary | ICD-10-CM | POA: Diagnosis not present

## 2017-08-19 DIAGNOSIS — Z23 Encounter for immunization: Secondary | ICD-10-CM | POA: Diagnosis not present

## 2017-08-19 DIAGNOSIS — N2581 Secondary hyperparathyroidism of renal origin: Secondary | ICD-10-CM | POA: Diagnosis not present

## 2017-08-20 DIAGNOSIS — D631 Anemia in chronic kidney disease: Secondary | ICD-10-CM | POA: Diagnosis not present

## 2017-08-20 DIAGNOSIS — Z23 Encounter for immunization: Secondary | ICD-10-CM | POA: Diagnosis not present

## 2017-08-20 DIAGNOSIS — N186 End stage renal disease: Secondary | ICD-10-CM | POA: Diagnosis not present

## 2017-08-20 DIAGNOSIS — N2581 Secondary hyperparathyroidism of renal origin: Secondary | ICD-10-CM | POA: Diagnosis not present

## 2017-08-21 DIAGNOSIS — D631 Anemia in chronic kidney disease: Secondary | ICD-10-CM | POA: Diagnosis not present

## 2017-08-21 DIAGNOSIS — Z23 Encounter for immunization: Secondary | ICD-10-CM | POA: Diagnosis not present

## 2017-08-21 DIAGNOSIS — N186 End stage renal disease: Secondary | ICD-10-CM | POA: Diagnosis not present

## 2017-08-21 DIAGNOSIS — N2581 Secondary hyperparathyroidism of renal origin: Secondary | ICD-10-CM | POA: Diagnosis not present

## 2017-08-22 DIAGNOSIS — Z23 Encounter for immunization: Secondary | ICD-10-CM | POA: Diagnosis not present

## 2017-08-22 DIAGNOSIS — D631 Anemia in chronic kidney disease: Secondary | ICD-10-CM | POA: Diagnosis not present

## 2017-08-22 DIAGNOSIS — Z992 Dependence on renal dialysis: Secondary | ICD-10-CM | POA: Diagnosis not present

## 2017-08-22 DIAGNOSIS — N2581 Secondary hyperparathyroidism of renal origin: Secondary | ICD-10-CM | POA: Diagnosis not present

## 2017-08-22 DIAGNOSIS — N186 End stage renal disease: Secondary | ICD-10-CM | POA: Diagnosis not present

## 2017-08-23 DIAGNOSIS — N2581 Secondary hyperparathyroidism of renal origin: Secondary | ICD-10-CM | POA: Diagnosis not present

## 2017-08-23 DIAGNOSIS — K439 Ventral hernia without obstruction or gangrene: Secondary | ICD-10-CM | POA: Diagnosis not present

## 2017-08-23 DIAGNOSIS — N186 End stage renal disease: Secondary | ICD-10-CM | POA: Diagnosis not present

## 2017-08-23 DIAGNOSIS — D631 Anemia in chronic kidney disease: Secondary | ICD-10-CM | POA: Diagnosis not present

## 2017-08-24 DIAGNOSIS — N186 End stage renal disease: Secondary | ICD-10-CM | POA: Diagnosis not present

## 2017-08-24 DIAGNOSIS — D631 Anemia in chronic kidney disease: Secondary | ICD-10-CM | POA: Diagnosis not present

## 2017-08-24 DIAGNOSIS — N2581 Secondary hyperparathyroidism of renal origin: Secondary | ICD-10-CM | POA: Diagnosis not present

## 2017-08-25 DIAGNOSIS — N186 End stage renal disease: Secondary | ICD-10-CM | POA: Diagnosis not present

## 2017-08-25 DIAGNOSIS — N2581 Secondary hyperparathyroidism of renal origin: Secondary | ICD-10-CM | POA: Diagnosis not present

## 2017-08-25 DIAGNOSIS — D631 Anemia in chronic kidney disease: Secondary | ICD-10-CM | POA: Diagnosis not present

## 2017-08-26 DIAGNOSIS — N2581 Secondary hyperparathyroidism of renal origin: Secondary | ICD-10-CM | POA: Diagnosis not present

## 2017-08-26 DIAGNOSIS — N186 End stage renal disease: Secondary | ICD-10-CM | POA: Diagnosis not present

## 2017-08-26 DIAGNOSIS — D631 Anemia in chronic kidney disease: Secondary | ICD-10-CM | POA: Diagnosis not present

## 2017-08-27 DIAGNOSIS — N2581 Secondary hyperparathyroidism of renal origin: Secondary | ICD-10-CM | POA: Diagnosis not present

## 2017-08-27 DIAGNOSIS — N186 End stage renal disease: Secondary | ICD-10-CM | POA: Diagnosis not present

## 2017-08-27 DIAGNOSIS — D631 Anemia in chronic kidney disease: Secondary | ICD-10-CM | POA: Diagnosis not present

## 2017-08-28 DIAGNOSIS — N2581 Secondary hyperparathyroidism of renal origin: Secondary | ICD-10-CM | POA: Diagnosis not present

## 2017-08-28 DIAGNOSIS — D631 Anemia in chronic kidney disease: Secondary | ICD-10-CM | POA: Diagnosis not present

## 2017-08-28 DIAGNOSIS — N186 End stage renal disease: Secondary | ICD-10-CM | POA: Diagnosis not present

## 2017-08-29 DIAGNOSIS — N186 End stage renal disease: Secondary | ICD-10-CM | POA: Diagnosis not present

## 2017-08-29 DIAGNOSIS — N2581 Secondary hyperparathyroidism of renal origin: Secondary | ICD-10-CM | POA: Diagnosis not present

## 2017-08-29 DIAGNOSIS — D631 Anemia in chronic kidney disease: Secondary | ICD-10-CM | POA: Diagnosis not present

## 2017-08-30 DIAGNOSIS — Z1211 Encounter for screening for malignant neoplasm of colon: Secondary | ICD-10-CM | POA: Diagnosis not present

## 2017-08-30 DIAGNOSIS — G40909 Epilepsy, unspecified, not intractable, without status epilepticus: Secondary | ICD-10-CM | POA: Diagnosis not present

## 2017-08-30 DIAGNOSIS — D124 Benign neoplasm of descending colon: Secondary | ICD-10-CM | POA: Diagnosis not present

## 2017-08-30 DIAGNOSIS — N186 End stage renal disease: Secondary | ICD-10-CM | POA: Diagnosis not present

## 2017-08-30 DIAGNOSIS — I120 Hypertensive chronic kidney disease with stage 5 chronic kidney disease or end stage renal disease: Secondary | ICD-10-CM | POA: Diagnosis not present

## 2017-08-30 DIAGNOSIS — F79 Unspecified intellectual disabilities: Secondary | ICD-10-CM | POA: Diagnosis not present

## 2017-08-30 DIAGNOSIS — D126 Benign neoplasm of colon, unspecified: Secondary | ICD-10-CM | POA: Diagnosis not present

## 2017-08-30 DIAGNOSIS — G4733 Obstructive sleep apnea (adult) (pediatric): Secondary | ICD-10-CM | POA: Diagnosis not present

## 2017-08-30 DIAGNOSIS — N2581 Secondary hyperparathyroidism of renal origin: Secondary | ICD-10-CM | POA: Diagnosis not present

## 2017-08-30 DIAGNOSIS — D631 Anemia in chronic kidney disease: Secondary | ICD-10-CM | POA: Diagnosis not present

## 2017-08-30 DIAGNOSIS — F419 Anxiety disorder, unspecified: Secondary | ICD-10-CM | POA: Diagnosis not present

## 2017-08-30 DIAGNOSIS — Z79899 Other long term (current) drug therapy: Secondary | ICD-10-CM | POA: Diagnosis not present

## 2017-08-30 DIAGNOSIS — E785 Hyperlipidemia, unspecified: Secondary | ICD-10-CM | POA: Diagnosis not present

## 2017-08-30 DIAGNOSIS — E039 Hypothyroidism, unspecified: Secondary | ICD-10-CM | POA: Diagnosis not present

## 2017-08-31 DIAGNOSIS — N186 End stage renal disease: Secondary | ICD-10-CM | POA: Diagnosis not present

## 2017-08-31 DIAGNOSIS — N2581 Secondary hyperparathyroidism of renal origin: Secondary | ICD-10-CM | POA: Diagnosis not present

## 2017-08-31 DIAGNOSIS — D631 Anemia in chronic kidney disease: Secondary | ICD-10-CM | POA: Diagnosis not present

## 2017-09-01 DIAGNOSIS — D631 Anemia in chronic kidney disease: Secondary | ICD-10-CM | POA: Diagnosis not present

## 2017-09-01 DIAGNOSIS — N2581 Secondary hyperparathyroidism of renal origin: Secondary | ICD-10-CM | POA: Diagnosis not present

## 2017-09-01 DIAGNOSIS — N186 End stage renal disease: Secondary | ICD-10-CM | POA: Diagnosis not present

## 2017-09-02 DIAGNOSIS — N186 End stage renal disease: Secondary | ICD-10-CM | POA: Diagnosis not present

## 2017-09-02 DIAGNOSIS — N2581 Secondary hyperparathyroidism of renal origin: Secondary | ICD-10-CM | POA: Diagnosis not present

## 2017-09-02 DIAGNOSIS — D631 Anemia in chronic kidney disease: Secondary | ICD-10-CM | POA: Diagnosis not present

## 2017-09-03 DIAGNOSIS — D631 Anemia in chronic kidney disease: Secondary | ICD-10-CM | POA: Diagnosis not present

## 2017-09-03 DIAGNOSIS — N2581 Secondary hyperparathyroidism of renal origin: Secondary | ICD-10-CM | POA: Diagnosis not present

## 2017-09-03 DIAGNOSIS — N186 End stage renal disease: Secondary | ICD-10-CM | POA: Diagnosis not present

## 2017-09-04 DIAGNOSIS — D631 Anemia in chronic kidney disease: Secondary | ICD-10-CM | POA: Diagnosis not present

## 2017-09-04 DIAGNOSIS — N186 End stage renal disease: Secondary | ICD-10-CM | POA: Diagnosis not present

## 2017-09-04 DIAGNOSIS — N2581 Secondary hyperparathyroidism of renal origin: Secondary | ICD-10-CM | POA: Diagnosis not present

## 2017-09-05 DIAGNOSIS — G4733 Obstructive sleep apnea (adult) (pediatric): Secondary | ICD-10-CM | POA: Diagnosis not present

## 2017-09-05 DIAGNOSIS — N2581 Secondary hyperparathyroidism of renal origin: Secondary | ICD-10-CM | POA: Diagnosis not present

## 2017-09-05 DIAGNOSIS — N186 End stage renal disease: Secondary | ICD-10-CM | POA: Diagnosis not present

## 2017-09-05 DIAGNOSIS — D631 Anemia in chronic kidney disease: Secondary | ICD-10-CM | POA: Diagnosis not present

## 2017-09-06 DIAGNOSIS — N2581 Secondary hyperparathyroidism of renal origin: Secondary | ICD-10-CM | POA: Diagnosis not present

## 2017-09-06 DIAGNOSIS — N186 End stage renal disease: Secondary | ICD-10-CM | POA: Diagnosis not present

## 2017-09-06 DIAGNOSIS — D631 Anemia in chronic kidney disease: Secondary | ICD-10-CM | POA: Diagnosis not present

## 2017-09-06 DIAGNOSIS — G4733 Obstructive sleep apnea (adult) (pediatric): Secondary | ICD-10-CM | POA: Diagnosis not present

## 2017-09-07 ENCOUNTER — Telehealth: Payer: Self-pay | Admitting: Pulmonary Disease

## 2017-09-07 DIAGNOSIS — D631 Anemia in chronic kidney disease: Secondary | ICD-10-CM | POA: Diagnosis not present

## 2017-09-07 DIAGNOSIS — N186 End stage renal disease: Secondary | ICD-10-CM | POA: Diagnosis not present

## 2017-09-07 DIAGNOSIS — N2581 Secondary hyperparathyroidism of renal origin: Secondary | ICD-10-CM | POA: Diagnosis not present

## 2017-09-07 NOTE — Telephone Encounter (Signed)
Per RA, HST showed moderate OSA with 20 events per hour. Recommends a CPAP titration study as the next step.

## 2017-09-08 DIAGNOSIS — N186 End stage renal disease: Secondary | ICD-10-CM | POA: Diagnosis not present

## 2017-09-08 DIAGNOSIS — D631 Anemia in chronic kidney disease: Secondary | ICD-10-CM | POA: Diagnosis not present

## 2017-09-08 DIAGNOSIS — N2581 Secondary hyperparathyroidism of renal origin: Secondary | ICD-10-CM | POA: Diagnosis not present

## 2017-09-09 DIAGNOSIS — N2581 Secondary hyperparathyroidism of renal origin: Secondary | ICD-10-CM | POA: Diagnosis not present

## 2017-09-09 DIAGNOSIS — D631 Anemia in chronic kidney disease: Secondary | ICD-10-CM | POA: Diagnosis not present

## 2017-09-09 DIAGNOSIS — N186 End stage renal disease: Secondary | ICD-10-CM | POA: Diagnosis not present

## 2017-09-10 DIAGNOSIS — N186 End stage renal disease: Secondary | ICD-10-CM | POA: Diagnosis not present

## 2017-09-10 DIAGNOSIS — N2581 Secondary hyperparathyroidism of renal origin: Secondary | ICD-10-CM | POA: Diagnosis not present

## 2017-09-10 DIAGNOSIS — D631 Anemia in chronic kidney disease: Secondary | ICD-10-CM | POA: Diagnosis not present

## 2017-09-11 ENCOUNTER — Other Ambulatory Visit: Payer: Self-pay | Admitting: *Deleted

## 2017-09-11 DIAGNOSIS — G4733 Obstructive sleep apnea (adult) (pediatric): Secondary | ICD-10-CM

## 2017-09-11 DIAGNOSIS — D631 Anemia in chronic kidney disease: Secondary | ICD-10-CM | POA: Diagnosis not present

## 2017-09-11 DIAGNOSIS — N2581 Secondary hyperparathyroidism of renal origin: Secondary | ICD-10-CM | POA: Diagnosis not present

## 2017-09-11 DIAGNOSIS — N186 End stage renal disease: Secondary | ICD-10-CM | POA: Diagnosis not present

## 2017-09-12 DIAGNOSIS — N2581 Secondary hyperparathyroidism of renal origin: Secondary | ICD-10-CM | POA: Diagnosis not present

## 2017-09-12 DIAGNOSIS — N186 End stage renal disease: Secondary | ICD-10-CM | POA: Diagnosis not present

## 2017-09-12 DIAGNOSIS — D631 Anemia in chronic kidney disease: Secondary | ICD-10-CM | POA: Diagnosis not present

## 2017-09-13 DIAGNOSIS — N186 End stage renal disease: Secondary | ICD-10-CM | POA: Diagnosis not present

## 2017-09-13 DIAGNOSIS — N2581 Secondary hyperparathyroidism of renal origin: Secondary | ICD-10-CM | POA: Diagnosis not present

## 2017-09-13 DIAGNOSIS — D631 Anemia in chronic kidney disease: Secondary | ICD-10-CM | POA: Diagnosis not present

## 2017-09-14 DIAGNOSIS — N186 End stage renal disease: Secondary | ICD-10-CM | POA: Diagnosis not present

## 2017-09-14 DIAGNOSIS — N2581 Secondary hyperparathyroidism of renal origin: Secondary | ICD-10-CM | POA: Diagnosis not present

## 2017-09-14 DIAGNOSIS — D631 Anemia in chronic kidney disease: Secondary | ICD-10-CM | POA: Diagnosis not present

## 2017-09-15 DIAGNOSIS — N2581 Secondary hyperparathyroidism of renal origin: Secondary | ICD-10-CM | POA: Diagnosis not present

## 2017-09-15 DIAGNOSIS — N186 End stage renal disease: Secondary | ICD-10-CM | POA: Diagnosis not present

## 2017-09-15 DIAGNOSIS — D631 Anemia in chronic kidney disease: Secondary | ICD-10-CM | POA: Diagnosis not present

## 2017-09-16 DIAGNOSIS — D631 Anemia in chronic kidney disease: Secondary | ICD-10-CM | POA: Diagnosis not present

## 2017-09-16 DIAGNOSIS — N186 End stage renal disease: Secondary | ICD-10-CM | POA: Diagnosis not present

## 2017-09-16 DIAGNOSIS — N2581 Secondary hyperparathyroidism of renal origin: Secondary | ICD-10-CM | POA: Diagnosis not present

## 2017-09-17 DIAGNOSIS — N2581 Secondary hyperparathyroidism of renal origin: Secondary | ICD-10-CM | POA: Diagnosis not present

## 2017-09-17 DIAGNOSIS — D631 Anemia in chronic kidney disease: Secondary | ICD-10-CM | POA: Diagnosis not present

## 2017-09-17 DIAGNOSIS — N186 End stage renal disease: Secondary | ICD-10-CM | POA: Diagnosis not present

## 2017-09-18 DIAGNOSIS — N186 End stage renal disease: Secondary | ICD-10-CM | POA: Diagnosis not present

## 2017-09-18 DIAGNOSIS — D631 Anemia in chronic kidney disease: Secondary | ICD-10-CM | POA: Diagnosis not present

## 2017-09-18 DIAGNOSIS — N2581 Secondary hyperparathyroidism of renal origin: Secondary | ICD-10-CM | POA: Diagnosis not present

## 2017-09-19 DIAGNOSIS — N2581 Secondary hyperparathyroidism of renal origin: Secondary | ICD-10-CM | POA: Diagnosis not present

## 2017-09-19 DIAGNOSIS — D631 Anemia in chronic kidney disease: Secondary | ICD-10-CM | POA: Diagnosis not present

## 2017-09-19 DIAGNOSIS — N186 End stage renal disease: Secondary | ICD-10-CM | POA: Diagnosis not present

## 2017-09-20 DIAGNOSIS — N186 End stage renal disease: Secondary | ICD-10-CM | POA: Diagnosis not present

## 2017-09-20 DIAGNOSIS — D631 Anemia in chronic kidney disease: Secondary | ICD-10-CM | POA: Diagnosis not present

## 2017-09-20 DIAGNOSIS — N2581 Secondary hyperparathyroidism of renal origin: Secondary | ICD-10-CM | POA: Diagnosis not present

## 2017-09-21 DIAGNOSIS — N186 End stage renal disease: Secondary | ICD-10-CM | POA: Diagnosis not present

## 2017-09-21 DIAGNOSIS — N2581 Secondary hyperparathyroidism of renal origin: Secondary | ICD-10-CM | POA: Diagnosis not present

## 2017-09-21 DIAGNOSIS — D631 Anemia in chronic kidney disease: Secondary | ICD-10-CM | POA: Diagnosis not present

## 2017-09-21 DIAGNOSIS — Z992 Dependence on renal dialysis: Secondary | ICD-10-CM | POA: Diagnosis not present

## 2017-09-22 DIAGNOSIS — D631 Anemia in chronic kidney disease: Secondary | ICD-10-CM | POA: Diagnosis not present

## 2017-09-22 DIAGNOSIS — N2581 Secondary hyperparathyroidism of renal origin: Secondary | ICD-10-CM | POA: Diagnosis not present

## 2017-09-22 DIAGNOSIS — N186 End stage renal disease: Secondary | ICD-10-CM | POA: Diagnosis not present

## 2017-09-23 DIAGNOSIS — D631 Anemia in chronic kidney disease: Secondary | ICD-10-CM | POA: Diagnosis not present

## 2017-09-23 DIAGNOSIS — N2581 Secondary hyperparathyroidism of renal origin: Secondary | ICD-10-CM | POA: Diagnosis not present

## 2017-09-23 DIAGNOSIS — N186 End stage renal disease: Secondary | ICD-10-CM | POA: Diagnosis not present

## 2017-09-24 DIAGNOSIS — D631 Anemia in chronic kidney disease: Secondary | ICD-10-CM | POA: Diagnosis not present

## 2017-09-24 DIAGNOSIS — N2581 Secondary hyperparathyroidism of renal origin: Secondary | ICD-10-CM | POA: Diagnosis not present

## 2017-09-24 DIAGNOSIS — N186 End stage renal disease: Secondary | ICD-10-CM | POA: Diagnosis not present

## 2017-09-25 DIAGNOSIS — D631 Anemia in chronic kidney disease: Secondary | ICD-10-CM | POA: Diagnosis not present

## 2017-09-25 DIAGNOSIS — N186 End stage renal disease: Secondary | ICD-10-CM | POA: Diagnosis not present

## 2017-09-25 DIAGNOSIS — N2581 Secondary hyperparathyroidism of renal origin: Secondary | ICD-10-CM | POA: Diagnosis not present

## 2017-09-26 DIAGNOSIS — D631 Anemia in chronic kidney disease: Secondary | ICD-10-CM | POA: Diagnosis not present

## 2017-09-26 DIAGNOSIS — N2581 Secondary hyperparathyroidism of renal origin: Secondary | ICD-10-CM | POA: Diagnosis not present

## 2017-09-26 DIAGNOSIS — N186 End stage renal disease: Secondary | ICD-10-CM | POA: Diagnosis not present

## 2017-09-27 DIAGNOSIS — N186 End stage renal disease: Secondary | ICD-10-CM | POA: Diagnosis not present

## 2017-09-27 DIAGNOSIS — D631 Anemia in chronic kidney disease: Secondary | ICD-10-CM | POA: Diagnosis not present

## 2017-09-27 DIAGNOSIS — N2581 Secondary hyperparathyroidism of renal origin: Secondary | ICD-10-CM | POA: Diagnosis not present

## 2017-09-28 DIAGNOSIS — N2581 Secondary hyperparathyroidism of renal origin: Secondary | ICD-10-CM | POA: Diagnosis not present

## 2017-09-28 DIAGNOSIS — N186 End stage renal disease: Secondary | ICD-10-CM | POA: Diagnosis not present

## 2017-09-28 DIAGNOSIS — Z4932 Encounter for adequacy testing for peritoneal dialysis: Secondary | ICD-10-CM | POA: Diagnosis not present

## 2017-09-28 DIAGNOSIS — D631 Anemia in chronic kidney disease: Secondary | ICD-10-CM | POA: Diagnosis not present

## 2017-09-29 DIAGNOSIS — N186 End stage renal disease: Secondary | ICD-10-CM | POA: Diagnosis not present

## 2017-09-29 DIAGNOSIS — N2581 Secondary hyperparathyroidism of renal origin: Secondary | ICD-10-CM | POA: Diagnosis not present

## 2017-09-29 DIAGNOSIS — D631 Anemia in chronic kidney disease: Secondary | ICD-10-CM | POA: Diagnosis not present

## 2017-09-30 DIAGNOSIS — N2581 Secondary hyperparathyroidism of renal origin: Secondary | ICD-10-CM | POA: Diagnosis not present

## 2017-09-30 DIAGNOSIS — N186 End stage renal disease: Secondary | ICD-10-CM | POA: Diagnosis not present

## 2017-09-30 DIAGNOSIS — D631 Anemia in chronic kidney disease: Secondary | ICD-10-CM | POA: Diagnosis not present

## 2017-10-01 DIAGNOSIS — N186 End stage renal disease: Secondary | ICD-10-CM | POA: Diagnosis not present

## 2017-10-01 DIAGNOSIS — N2581 Secondary hyperparathyroidism of renal origin: Secondary | ICD-10-CM | POA: Diagnosis not present

## 2017-10-01 DIAGNOSIS — D631 Anemia in chronic kidney disease: Secondary | ICD-10-CM | POA: Diagnosis not present

## 2017-10-02 DIAGNOSIS — N2581 Secondary hyperparathyroidism of renal origin: Secondary | ICD-10-CM | POA: Diagnosis not present

## 2017-10-02 DIAGNOSIS — D631 Anemia in chronic kidney disease: Secondary | ICD-10-CM | POA: Diagnosis not present

## 2017-10-02 DIAGNOSIS — N186 End stage renal disease: Secondary | ICD-10-CM | POA: Diagnosis not present

## 2017-10-03 DIAGNOSIS — N2581 Secondary hyperparathyroidism of renal origin: Secondary | ICD-10-CM | POA: Diagnosis not present

## 2017-10-03 DIAGNOSIS — N186 End stage renal disease: Secondary | ICD-10-CM | POA: Diagnosis not present

## 2017-10-03 DIAGNOSIS — D631 Anemia in chronic kidney disease: Secondary | ICD-10-CM | POA: Diagnosis not present

## 2017-10-04 DIAGNOSIS — D631 Anemia in chronic kidney disease: Secondary | ICD-10-CM | POA: Diagnosis not present

## 2017-10-04 DIAGNOSIS — N186 End stage renal disease: Secondary | ICD-10-CM | POA: Diagnosis not present

## 2017-10-04 DIAGNOSIS — N2581 Secondary hyperparathyroidism of renal origin: Secondary | ICD-10-CM | POA: Diagnosis not present

## 2017-10-05 DIAGNOSIS — N186 End stage renal disease: Secondary | ICD-10-CM | POA: Diagnosis not present

## 2017-10-05 DIAGNOSIS — D631 Anemia in chronic kidney disease: Secondary | ICD-10-CM | POA: Diagnosis not present

## 2017-10-05 DIAGNOSIS — N2581 Secondary hyperparathyroidism of renal origin: Secondary | ICD-10-CM | POA: Diagnosis not present

## 2017-10-06 DIAGNOSIS — N186 End stage renal disease: Secondary | ICD-10-CM | POA: Diagnosis not present

## 2017-10-06 DIAGNOSIS — D631 Anemia in chronic kidney disease: Secondary | ICD-10-CM | POA: Diagnosis not present

## 2017-10-06 DIAGNOSIS — N2581 Secondary hyperparathyroidism of renal origin: Secondary | ICD-10-CM | POA: Diagnosis not present

## 2017-10-07 ENCOUNTER — Encounter (HOSPITAL_COMMUNITY): Payer: Self-pay | Admitting: *Deleted

## 2017-10-07 ENCOUNTER — Emergency Department (HOSPITAL_COMMUNITY): Payer: Medicare Other

## 2017-10-07 ENCOUNTER — Inpatient Hospital Stay (HOSPITAL_COMMUNITY)
Admission: EM | Admit: 2017-10-07 | Discharge: 2017-10-09 | DRG: 100 | Disposition: A | Discharge status: Home / Self Care | Payer: Medicare Other | Attending: Internal Medicine | Admitting: Internal Medicine

## 2017-10-07 DIAGNOSIS — F319 Bipolar disorder, unspecified: Secondary | ICD-10-CM | POA: Diagnosis present

## 2017-10-07 DIAGNOSIS — G40909 Epilepsy, unspecified, not intractable, without status epilepticus: Secondary | ICD-10-CM | POA: Diagnosis not present

## 2017-10-07 DIAGNOSIS — R197 Diarrhea, unspecified: Secondary | ICD-10-CM | POA: Diagnosis not present

## 2017-10-07 DIAGNOSIS — E039 Hypothyroidism, unspecified: Secondary | ICD-10-CM | POA: Diagnosis not present

## 2017-10-07 DIAGNOSIS — R471 Dysarthria and anarthria: Secondary | ICD-10-CM

## 2017-10-07 DIAGNOSIS — Z8249 Family history of ischemic heart disease and other diseases of the circulatory system: Secondary | ICD-10-CM

## 2017-10-07 DIAGNOSIS — N186 End stage renal disease: Secondary | ICD-10-CM

## 2017-10-07 DIAGNOSIS — D509 Iron deficiency anemia, unspecified: Secondary | ICD-10-CM | POA: Diagnosis present

## 2017-10-07 DIAGNOSIS — N2581 Secondary hyperparathyroidism of renal origin: Secondary | ICD-10-CM | POA: Diagnosis present

## 2017-10-07 DIAGNOSIS — F79 Unspecified intellectual disabilities: Secondary | ICD-10-CM

## 2017-10-07 DIAGNOSIS — R112 Nausea with vomiting, unspecified: Secondary | ICD-10-CM

## 2017-10-07 DIAGNOSIS — R4781 Slurred speech: Secondary | ICD-10-CM | POA: Diagnosis present

## 2017-10-07 DIAGNOSIS — I120 Hypertensive chronic kidney disease with stage 5 chronic kidney disease or end stage renal disease: Secondary | ICD-10-CM | POA: Diagnosis not present

## 2017-10-07 DIAGNOSIS — R4182 Altered mental status, unspecified: Secondary | ICD-10-CM | POA: Diagnosis not present

## 2017-10-07 DIAGNOSIS — R4701 Aphasia: Secondary | ICD-10-CM | POA: Diagnosis not present

## 2017-10-07 DIAGNOSIS — Z992 Dependence on renal dialysis: Secondary | ICD-10-CM

## 2017-10-07 DIAGNOSIS — D631 Anemia in chronic kidney disease: Secondary | ICD-10-CM | POA: Diagnosis not present

## 2017-10-07 DIAGNOSIS — R569 Unspecified convulsions: Secondary | ICD-10-CM

## 2017-10-07 DIAGNOSIS — K8020 Calculus of gallbladder without cholecystitis without obstruction: Secondary | ICD-10-CM | POA: Diagnosis not present

## 2017-10-07 DIAGNOSIS — E785 Hyperlipidemia, unspecified: Secondary | ICD-10-CM | POA: Diagnosis present

## 2017-10-07 DIAGNOSIS — R1084 Generalized abdominal pain: Secondary | ICD-10-CM | POA: Diagnosis not present

## 2017-10-07 DIAGNOSIS — F259 Schizoaffective disorder, unspecified: Secondary | ICD-10-CM | POA: Diagnosis present

## 2017-10-07 DIAGNOSIS — G92 Toxic encephalopathy: Secondary | ICD-10-CM | POA: Diagnosis not present

## 2017-10-07 DIAGNOSIS — E8889 Other specified metabolic disorders: Secondary | ICD-10-CM | POA: Diagnosis present

## 2017-10-07 DIAGNOSIS — F209 Schizophrenia, unspecified: Secondary | ICD-10-CM | POA: Diagnosis present

## 2017-10-07 LAB — HCG, QUANTITATIVE, PREGNANCY: hCG, Beta Chain, Quant, S: 8 m[IU]/mL — ABNORMAL HIGH (ref <5)

## 2017-10-07 LAB — COMPREHENSIVE METABOLIC PANEL
ALK PHOS: 110 U/L (ref 38–126)
ALT: 12 U/L — AB (ref 14–54)
ANION GAP: 14 (ref 5–15)
AST: 19 U/L (ref 15–41)
Albumin: 3 g/dL — ABNORMAL LOW (ref 3.5–5.0)
BUN: 29 mg/dL — ABNORMAL HIGH (ref 6–20)
CALCIUM: 9.4 mg/dL (ref 8.9–10.3)
CHLORIDE: 93 mmol/L — AB (ref 101–111)
CO2: 26 mmol/L (ref 22–32)
CREATININE: 11.54 mg/dL — AB (ref 0.44–1.00)
GFR, EST AFRICAN AMERICAN: 4 mL/min — AB (ref >60)
GFR, EST NON AFRICAN AMERICAN: 3 mL/min — AB (ref >60)
Glucose, Bld: 112 mg/dL — ABNORMAL HIGH (ref 65–99)
Potassium: 3.8 mmol/L (ref 3.5–5.1)
SODIUM: 133 mmol/L — AB (ref 135–145)
Total Bilirubin: 0.5 mg/dL (ref 0.3–1.2)
Total Protein: 7.2 g/dL (ref 6.5–8.1)

## 2017-10-07 LAB — CBC
HEMATOCRIT: 28.4 % — AB (ref 36.0–46.0)
Hemoglobin: 8.9 g/dL — ABNORMAL LOW (ref 12.0–15.0)
MCH: 32.8 pg (ref 26.0–34.0)
MCHC: 31.3 g/dL (ref 30.0–36.0)
MCV: 104.8 fL — AB (ref 78.0–100.0)
PLATELETS: 303 10*3/uL (ref 150–400)
RBC: 2.71 MIL/uL — ABNORMAL LOW (ref 3.87–5.11)
RDW: 16.1 % — AB (ref 11.5–15.5)
WBC: 7.4 10*3/uL (ref 4.0–10.5)

## 2017-10-07 LAB — I-STAT CHEM 8, ED
BUN: 26 mg/dL — ABNORMAL HIGH (ref 6–20)
CHLORIDE: 92 mmol/L — AB (ref 101–111)
Calcium, Ion: 1.19 mmol/L (ref 1.15–1.40)
Creatinine, Ser: 10.9 mg/dL — ABNORMAL HIGH (ref 0.44–1.00)
GLUCOSE: 113 mg/dL — AB (ref 65–99)
HCT: 29 % — ABNORMAL LOW (ref 36.0–46.0)
Hemoglobin: 9.9 g/dL — ABNORMAL LOW (ref 12.0–15.0)
Potassium: 3.9 mmol/L (ref 3.5–5.1)
SODIUM: 136 mmol/L (ref 135–145)
TCO2: 31 mmol/L (ref 22–32)

## 2017-10-07 LAB — I-STAT BETA HCG BLOOD, ED (MC, WL, AP ONLY): HCG, QUANTITATIVE: 6.4 m[IU]/mL — AB (ref <5)

## 2017-10-07 LAB — DIFFERENTIAL
BASOS PCT: 1 %
Basophils Absolute: 0 10*3/uL (ref 0.0–0.1)
EOS PCT: 1 %
Eosinophils Absolute: 0.1 10*3/uL (ref 0.0–0.7)
Lymphocytes Relative: 27 %
Lymphs Abs: 2 10*3/uL (ref 0.7–4.0)
MONO ABS: 0.4 10*3/uL (ref 0.1–1.0)
MONOS PCT: 5 %
NEUTROS ABS: 4.9 10*3/uL (ref 1.7–7.7)
Neutrophils Relative %: 66 %

## 2017-10-07 LAB — VALPROIC ACID LEVEL: VALPROIC ACID LVL: 59 ug/mL (ref 50.0–100.0)

## 2017-10-07 LAB — PROTIME-INR
INR: 1.1
PROTHROMBIN TIME: 14.2 s (ref 11.4–15.2)

## 2017-10-07 LAB — I-STAT TROPONIN, ED: Troponin i, poc: 0.01 ng/mL (ref 0.00–0.08)

## 2017-10-07 LAB — APTT: aPTT: 36 seconds (ref 24–36)

## 2017-10-07 LAB — CBG MONITORING, ED: GLUCOSE-CAPILLARY: 100 mg/dL — AB (ref 65–99)

## 2017-10-07 MED ORDER — BENZTROPINE MESYLATE 0.5 MG PO TABS
1.0000 mg | ORAL_TABLET | Freq: Every day | ORAL | Status: DC
  Administered 2017-10-08 (×2): 1 mg via ORAL
  Filled 2017-10-07 (×2): qty 2

## 2017-10-07 MED ORDER — RISPERIDONE 2 MG PO TABS
2.0000 mg | ORAL_TABLET | Freq: Every day | ORAL | Status: DC
  Administered 2017-10-07 – 2017-10-08 (×2): 2 mg via ORAL
  Filled 2017-10-07 (×2): qty 1

## 2017-10-07 MED ORDER — ACETAMINOPHEN 325 MG PO TABS: 650.0000 mg | ORAL_TABLET | ORAL | Status: DC | PRN

## 2017-10-07 MED ORDER — LEVETIRACETAM 500 MG PO TABS
500.0000 mg | ORAL_TABLET | Freq: Two times a day (BID) | ORAL | Status: DC
  Administered 2017-10-07 – 2017-10-09 (×4): 500 mg via ORAL
  Filled 2017-10-07 (×4): qty 1

## 2017-10-07 MED ORDER — RENA-VITE PO TABS
1.0000 | ORAL_TABLET | Freq: Every day | ORAL | Status: DC
  Administered 2017-10-08 – 2017-10-09 (×2): 1 via ORAL
  Filled 2017-10-07 (×2): qty 1

## 2017-10-07 MED ORDER — HEPARIN SODIUM (PORCINE) 5000 UNIT/ML IJ SOLN
5000.0000 [IU] | Freq: Three times a day (TID) | INTRAMUSCULAR | Status: DC
  Administered 2017-10-08 – 2017-10-09 (×4): 5000 [IU] via SUBCUTANEOUS
  Filled 2017-10-07 (×4): qty 1

## 2017-10-07 MED ORDER — POTASSIUM CHLORIDE CRYS ER 20 MEQ PO TBCR
20.0000 meq | EXTENDED_RELEASE_TABLET | Freq: Every day | ORAL | Status: DC
  Administered 2017-10-08 – 2017-10-09 (×2): 20 meq via ORAL
  Filled 2017-10-07 (×2): qty 1

## 2017-10-07 MED ORDER — LEVOTHYROXINE SODIUM 50 MCG PO TABS
50.0000 ug | ORAL_TABLET | Freq: Every day | ORAL | Status: DC
  Administered 2017-10-08 – 2017-10-09 (×2): 50 ug via ORAL
  Filled 2017-10-07 (×2): qty 1

## 2017-10-07 MED ORDER — ACETAMINOPHEN 160 MG/5ML PO SOLN: 650.0000 mg | ORAL | Status: DC | PRN

## 2017-10-07 MED ORDER — SEVELAMER CARBONATE 800 MG PO TABS
800.0000 mg | ORAL_TABLET | Freq: Three times a day (TID) | ORAL | Status: DC
  Administered 2017-10-08 – 2017-10-09 (×5): 800 mg via ORAL
  Filled 2017-10-07 (×5): qty 1

## 2017-10-07 MED ORDER — CINACALCET HCL 30 MG PO TABS: 30.0000 mg | ORAL_TABLET | ORAL | Status: DC

## 2017-10-07 MED ORDER — ACETAMINOPHEN 650 MG RE SUPP: 650.0000 mg | RECTAL | Status: DC | PRN

## 2017-10-07 MED ORDER — SODIUM CHLORIDE 0.9 % IV SOLN
1250.0000 mg | Freq: Once | INTRAVENOUS | Status: AC
  Administered 2017-10-07: 1250 mg via INTRAVENOUS
  Filled 2017-10-07: qty 25

## 2017-10-07 MED ORDER — SEVELAMER CARBONATE 800 MG PO TABS: 800.0000 mg | ORAL_TABLET | ORAL | Status: DC | PRN

## 2017-10-07 MED ORDER — CYANOCOBALAMIN 500 MCG PO TABS
500.0000 ug | ORAL_TABLET | Freq: Every day | ORAL | Status: DC
  Administered 2017-10-08 – 2017-10-09 (×2): 500 ug via ORAL
  Filled 2017-10-07 (×2): qty 1

## 2017-10-07 MED ORDER — CINACALCET HCL 30 MG PO TABS
30.0000 mg | ORAL_TABLET | Freq: Every day | ORAL | Status: DC
  Administered 2017-10-09: 30 mg via ORAL
  Filled 2017-10-07 (×3): qty 1

## 2017-10-07 MED ORDER — DIVALPROEX SODIUM 500 MG PO DR TAB
500.0000 mg | DELAYED_RELEASE_TABLET | Freq: Two times a day (BID) | ORAL | Status: DC
  Administered 2017-10-07 – 2017-10-09 (×4): 500 mg via ORAL
  Filled 2017-10-07 (×4): qty 1

## 2017-10-07 MED ORDER — MEDROXYPROGESTERONE ACETATE 150 MG/ML IM SUSP: 150.0000 mg | INTRAMUSCULAR | Status: DC

## 2017-10-07 MED ORDER — ALBUTEROL SULFATE (2.5 MG/3ML) 0.083% IN NEBU: 3.0000 mL | INHALATION_SOLUTION | Freq: Four times a day (QID) | RESPIRATORY_TRACT | Status: DC | PRN

## 2017-10-07 MED ORDER — POLYSACCHARIDE IRON COMPLEX 150 MG PO CAPS
150.0000 mg | ORAL_CAPSULE | ORAL | Status: DC
  Administered 2017-10-08: 150 mg via ORAL
  Filled 2017-10-07: qty 1

## 2017-10-07 MED ORDER — CINACALCET HCL 30 MG PO TABS
60.0000 mg | ORAL_TABLET | Freq: Every day | ORAL | Status: DC
  Administered 2017-10-08: 60 mg via ORAL
  Filled 2017-10-07 (×2): qty 2

## 2017-10-07 NOTE — Consult Note (Addendum)
Neurology Consultation  Reason for Consult: speech probelm Referring Physician: Dr Regenia Skeeter  CC: garbled speech  History is obtained from: family at bedside  HPI: Jane [name] is a 51 y.o. female with past medical history of hypertension, schizophrenia, hyperlipidemia, bipolar disorder, mental retardation, hypothyroidism, end-stage renal disease on peritoneal dialysis brought to the emergency room after she woke up this morning with the family described as slurred speech.  According to them, she usually is able to carry out normal conversations but when she woke up this morning, her speech sounded slurred.  She also seem to be having difficulty finding the right words. She has not had similar symptoms in the past.  Her last seizure was over a year ago.  She is compliant with her medications-Depakote 500 twice daily and Keppra 500 twice daily. That said, she has been complaining of abdominal pain and has been having intermittent episodes of vomiting over the past few days. No respiratory illness.  No chest pain shortness of breath reported. Denies any preceding fevers or chills.  LKW: 2100 on 10/06/17 tpa given?: no, OSW Premorbid modified Rankin scale (mRS): 2  ROS: A 14 point ROS was performed and obtained from sister at bedside and is negative except as noted in the HPI.  Abdominal pain + Nausea+ Vomiting+  Past Medical History:  Diagnosis Date  . Anemia   . Bipolar disorder ([place])   . Depression   . ESRD on peritoneal dialysis ([place])   . Hyperlipemia   . Hypertension   . Hypothyroidism   . Moderately mentally retarded   . Renal disease   . Schizophrenia ([place])   . Seizures ([place])    No recent seizures - no meds    Family History  Problem Relation Age of Onset  . Hypertension Other     Social History:   reports that  has never smoked. she has never used smokeless tobacco. She reports that she does not drink alcohol or use drugs.  Medications No current  facility-administered medications for this encounter.   Current Outpatient Medications:  .  B Complex-C-Folic Acid (DIALYVITE 226) 0.8 MG TABS, Take 1 tablet by mouth daily., Disp: , Rfl:  .  benztropine (COGENTIN) 1 MG tablet, Take 1 mg by mouth at bedtime., Disp: , Rfl:  .  calcitRIOL (ROCALTROL) 0.25 MCG capsule, Take 0.25 mcg by mouth daily., Disp: , Rfl:  .  cinacalcet (SENSIPAR) 30 MG tablet, Take 30-60 mg by mouth See admin instructions. Take 1 tablet every morning and take 2 tablets every evening, Disp: , Rfl:  .  dicyclomine (BENTYL) 20 MG tablet, Take 1 tablet (20 mg total) by mouth 2 (two) times daily., Disp: 20 tablet, Rfl: 0 .  divalproex (DEPAKOTE) 250 MG DR tablet, Take 3 tablets (750 mg total) by mouth every 12 (twelve) hours., Disp: 180 tablet, Rfl: 1 .  divalproex (DEPAKOTE) 500 MG DR tablet, Take 500 mg by mouth 2 (two) times daily., Disp: , Rfl:  .  iron polysaccharides (NIFEREX) 150 MG capsule, Take 150 mg by mouth every other day., Disp: , Rfl:  .  levETIRAcetam (KEPPRA) 500 MG tablet, Take 1 tablet (500 mg total) by mouth 2 (two) times daily., Disp: 60 tablet, Rfl: 3 .  medroxyPROGESTERone (DEPO-PROVERA) 150 MG/ML injection, Inject 150 mg into the muscle every 3 (three) months. , Disp: , Rfl:  .  ondansetron (ZOFRAN ODT) 4 MG disintegrating tablet, Take 1 tablet (4 mg total) by mouth every 8 (eight) hours as needed for  nausea or vomiting., Disp: 20 tablet, Rfl: 0 .  potassium chloride SA (K-DUR,KLOR-CON) 20 MEQ tablet, Take 20 mEq by mouth daily., Disp: , Rfl:  .  risperiDONE (RISPERDAL) 2 MG tablet, Take 2 mg by mouth at bedtime., Disp: , Rfl:  .  sevelamer carbonate (RENVELA) 800 MG tablet, Take 800 mg by mouth 3 (three) times daily with meals., Disp: , Rfl:  .  vitamin B-12 (CYANOCOBALAMIN) 500 MCG tablet, Take 500 mcg by mouth daily., Disp: , Rfl:   Exam: Current vital signs: BP 119/79   Pulse 97   Temp 97.7 F (36.5 C) (Oral)   Resp 16   SpO2 94%  Vital signs in  last 24 hours: Temp:  [97.7 F (36.5 C)] 97.7 F (36.5 C) (12/16 1809) Pulse Rate:  [97-104] 97 (12/16 1830) Resp:  [16] 16 (12/16 1830) BP: (113-119)/(77-79) 119/79 (12/16 1830) SpO2:  [94 %-97 %] 94 % (12/16 1830)  GENERAL: Awake, alert in NAD HEENT: - Normocephalic and atraumatic, dry mm, no LN++, no Thyromegally LUNGS - Clear to auscultation bilaterally with no wheezes CV - S1S2 RRR, no m/r/g, equal pulses bilaterally. ABDOMEN - Soft, nontender, nondistended with normoactive BS Ext: warm, well perfused, intact peripheral pulses, no edema  NEURO:  Mental Status: Awake, alert, oriented to self, place (doctor's office-could not name the hospital), but not time.  Poor attention concentration. Language: speech is dysarthric.  Able to name simple objects such as pen but unable to name flashlight-said light bulb.  Also had perseveration when other objects were showed and kept repeating pen. Cranial Nerves: PERRL EOMI, visual fields full, no facial asymmetry,facial sensation intact, hearing intact, tongue/uvula/soft palate midline, normalsternocleidomastoid and trapezius muscle strength. No evidence of tongue atrophy or fibrillations Motor: 5/5 both upper extremities with normal tone normal range of motion Tone: is normal and bulk is normal Sensation- Intact to light touch bilaterally Coordination: FTN intact bilaterally Gait- deferred  NIHSS 1a Level of Conscious.: 0 1b LOC Questions: 1 1c LOC Commands: 0 2 Best Gaze: 0 3 Visual: 0 4 Facial Palsy: 0 5a Motor Arm - left: 0 5b Motor Arm - Right: 0 6a Motor Leg - Left: 0 6b Motor Leg - Right: 0 7 Limb Ataxia: 0 8 Sensory: 0 9 Best Language: 1 10 Dysarthria: 1 11 Extinct. and Inatten.: 0 TOTAL: 2  Labs I have reviewed labs in epic and the results pertinent to this consultation are:  CBC    Component Value Date/Time   WBC 7.4 10/07/2017 1815   RBC 2.71 (L) 10/07/2017 1815   HGB 9.9 (L) 10/07/2017 1829   HCT 29.0 (L)  10/07/2017 1829   PLT 303 10/07/2017 1815   MCV 104.8 (H) 10/07/2017 1815   MCH 32.8 10/07/2017 1815   MCHC 31.3 10/07/2017 1815   RDW 16.1 (H) 10/07/2017 1815   LYMPHSABS 2.0 10/07/2017 1815   MONOABS 0.4 10/07/2017 1815   EOSABS 0.1 10/07/2017 1815   BASOSABS 0.0 10/07/2017 1815    CMP     Component Value Date/Time   NA 136 10/07/2017 1829   K 3.9 10/07/2017 1829   CL 92 (L) 10/07/2017 1829   CO2 26 10/07/2017 1815   GLUCOSE 113 (H) 10/07/2017 1829   BUN 26 (H) 10/07/2017 1829   CREATININE 10.90 (H) 10/07/2017 1829   CALCIUM 9.4 10/07/2017 1815   PROT 7.2 10/07/2017 1815   ALBUMIN 3.0 (L) 10/07/2017 1815   AST 19 10/07/2017 1815   ALT 12 (L) 10/07/2017 1815   ALKPHOS  110 10/07/2017 1815   BILITOT 0.5 10/07/2017 1815   GFRNONAA 3 (L) 10/07/2017 1815   GFRAA 4 (L) 10/07/2017 1815    Imaging I have reviewed the images obtained: CT-scan of the brain -no acute changes  Assessment:  51 year old woman with a past medical history of hypertension, schizophrenia, bipolar disorder, hyperlipidemia, mental retardation, hypothyroidism and end-stage renal disease on PD, brought into the emergency room after she woke up this morning with speech problems. She is definitely dysarthric and has some aphasia as well. Review of her old EEGs reveal a left cerebral focus of seizures. There was no witnessed seizure prior to this presentation but she has been vomiting and throwing up her medications per family. Noncontrast CT of the head is not suggestive of any evolving stroke.  Although this does not rule out a stroke, given her history of seizures and recent GI sickness and inability to keep her medications down, I would suspect breakthrough seizures in the setting of subtherapeutic antiepileptics as the first differential.  Toxic metabolic encephalopathy from her deranged renal function and the presence of mental retardation as well as underlying schizophrenia/bipolar disorder could also be  contributing to her current presentation Stroke should be in the differential as well.  Impression: Evaluate for breakthrough seizure Evaluate for toxic metabolic encephalopathy Evaluate for stroke/TIA Evaluate for underlying infection- GI or urinary tract  Recommendations: Admit to the hospitalist Continue home doses of Keppra 500 twice daily and Depakote 500 twice daily.  Can use IV if she fails swallow evaluation. I recommend one-time dose of fosphenytoin, for the potential underlying seizure. She has been complaining of abdominal pain in the ER is obtaining CT scan of the abdomen.  Follow-up on CT scan and management per primary. I would recommend obtaining a urinalysis and a urinary tox screen as well. Check Depakote level Routine EEG in the morning MRI brain when able Seizure precautions Frequent neuro checks EEG in AM  -- Amie Portland, MD [facility] [phone] If 7pm to 7am, please call on call as listed on [facility]ION.

## 2017-10-07 NOTE — ED Notes (Signed)
Patient denies pain and is resting comfortably.  

## 2017-10-07 NOTE — ED Notes (Signed)
Pt calmed down at this time. Sister states that she does this sometimes.

## 2017-10-07 NOTE — ED Notes (Signed)
HD RN notified of need for peritoneal fluid sample, wait will be at least 2 hours before he can collect sample. MD aware sample will be collected when pt is on inpatient floor.  AC awaare

## 2017-10-07 NOTE — ED Notes (Addendum)
Delay in lab draw pt crying at this time and moving around in bed.

## 2017-10-07 NOTE — ED Provider Notes (Signed)
[facility] EMERGENCY DEPARTMENT Provider Note   CSN: [id number] Arrival date & time: 10/07/17  1757     History   Chief Complaint Chief Complaint  Patient presents with  . Altered Mental Status  . Aphasia    HPI Jane [name] is a 51 y.o. female.  HPI 51 year old African-American female past medical history significant for anemia, depression, bipolar disorder, ESRD on peritoneal dialysis, hypertension, hyperlipidemia, mental retardation, schizophrenia, seizures presents to the emergency department today with sister at bedside with complaints of difficulty speaking.  Per the sister patient was fine when she went to bed last night.  Woke up at approximately 8 AM this morning with some slurred speech.  The sister specifically states that patient is stuttering with her words and having difficulties finding her words.  She does have some slurred speech at baseline on prior history.  Patient does have history of seizure and currently takes Keppra and Dilantin.  States that she is taking these correctly.  Sister states that patient is usually able to communicate normally.  The speech difficulty is new.  Denies any associated facial droop, difficulties ambulate in, weakness.  Patient is able to walk at baseline and communicate at baseline normally.  Patient also reports 2 days of nausea and vomiting with associated diarrhea.  Patient does currently receive hemodialysis through her peritoneal port daily.  Reports abdominal pain today that is generalized.  Sister at bedside states that patient does complain of a chronic abdominal pain at times.  However the nausea, vomiting, diarrhea is new.  Has not taken any for the symptoms.  Nothing makes better or worse.  Denies any drug use.  Denies alcohol use.  Denies any other recent illnesses.  Pt denies any fever, chill, ha, vision changes, lightheadedness, dizziness, congestion, neck pain, cp, sob, cough, urinary symptoms, melena,  hematochezia, lower extremity paresthesias.  Past Medical History:  Diagnosis Date  . Anemia   . Bipolar disorder ([place])   . Depression   . ESRD on peritoneal dialysis ([place])   . Hyperlipemia   . Hypertension   . Hypothyroidism   . Moderately mentally retarded   . Renal disease   . Schizophrenia ([place])   . Seizures ([place])    No recent seizures - no meds    Patient Active Problem List   Diagnosis Date Noted  . OSA (obstructive sleep apnea) 07/25/2017  . Seizure disorder ([place]) 04/22/2016  . End-stage renal disease on peritoneal dialysis ([place])   . Partial seizure ([place])   . Seizure ([place]) 09/14/2015  . Essential hypertension 10/11/2010  . FOOT PAIN, RIGHT 10/11/2010  . BRONCHITIS, ACUTE WITH MILD BRONCHOSPASM 10/04/2010  . ANKLE SPRAIN, RIGHT 02/20/2010  . WEIGHT GAIN 06/17/2009  . Dermatophytosis of foot 04/16/2009  . VAGINAL DISCHARGE 04/16/2009  . INSOMNIA 04/16/2009  . POLYDIPSIA 04/16/2009  . FREQUENCY, URINARY 04/16/2009  . ANEMIA, IRON DEFICIENCY 12/03/2007  . SEIZURE DISORDER 09/03/2007  . DYSLIPIDEMIA 06/11/2007  . OBESITY NOS 06/11/2007  . Schizoaffective disorder ([place]) 06/11/2007  . DISORDER, BIPOLAR NOS 06/11/2007  . RETARDATION, MENTAL NOS 06/11/2007  . RENAL INSUFFICIENCY, CHRONIC 06/11/2007  . ACNE NEC 06/11/2007  . URINARY INCONTINENCE, URGE 06/11/2007  . REDUCTION MAMMOPLASTY, HX OF 06/11/2007    Past Surgical History:  Procedure Laterality Date  . BREAST REDUCTION SURGERY    . BREAST SURGERY     breast reduction  . EXAMINATION UNDER ANESTHESIA N/A 12/08/2013   Procedure: EXAM UNDER ANESTHESIA;  Surgeon: Lavonia Drafts, MD;  Location:  [facility];  Service: Gynecology;  Laterality: N/A;  Pelvic exam and pap smear, unable to tolerate during last office visit  . FOOT SURGERY     right    OB History    Gravida Para Term Preterm AB Living   0 0 0 0 0 0   SAB TAB Ectopic Multiple Live Births   0 0 0 0         Home Medications    Prior to  Admission medications   Medication Sig Start Date End Date Taking? Authorizing Provider  B Complex-C-Folic Acid (DIALYVITE 761) 0.8 MG TABS Take 1 tablet by mouth daily.    [provider]  benztropine (COGENTIN) 1 MG tablet Take 1 mg by mouth at bedtime.    [provider]  calcitRIOL (ROCALTROL) 0.25 MCG capsule Take 0.25 mcg by mouth daily.    [provider]  cinacalcet (SENSIPAR) 30 MG tablet Take 30-60 mg by mouth See admin instructions. Take 1 tablet every morning and take 2 tablets every evening    [provider]  dicyclomine (BENTYL) 20 MG tablet Take 1 tablet (20 mg total) by mouth 2 (two) times daily. 06/21/16   Delsa Grana, PA-C  divalproex (DEPAKOTE) 250 MG DR tablet Take 3 tablets (750 mg total) by mouth every 12 (twelve) hours. 04/22/16   Caren Griffins, MD  divalproex (DEPAKOTE) 500 MG DR tablet Take 500 mg by mouth 2 (two) times daily.    [provider]  iron polysaccharides (NIFEREX) 150 MG capsule Take 150 mg by mouth every other day.    [provider]  levETIRAcetam (KEPPRA) 500 MG tablet Take 1 tablet (500 mg total) by mouth 2 (two) times daily. 09/17/15   Charlynne Cousins, MD  medroxyPROGESTERone (DEPO-PROVERA) 150 MG/ML injection Inject 150 mg into the muscle every 3 (three) months.     [provider]  ondansetron (ZOFRAN ODT) 4 MG disintegrating tablet Take 1 tablet (4 mg total) by mouth every 8 (eight) hours as needed for nausea or vomiting. 06/21/16   Delsa Grana, PA-C  potassium chloride SA (K-DUR,KLOR-CON) 20 MEQ tablet Take 20 mEq by mouth daily.    [provider]  risperiDONE (RISPERDAL) 2 MG tablet Take 2 mg by mouth at bedtime.    [provider]  sevelamer carbonate (RENVELA) 800 MG tablet Take 800 mg by mouth 3 (three) times daily with meals.    [provider]  vitamin B-12 (CYANOCOBALAMIN) 500 MCG tablet Take 500 mcg by mouth daily.    [provider]     Family History Family History  Problem Relation Age of Onset  . Hypertension Other     Social History Social History   Tobacco Use  . Smoking status: Never Smoker  . Smokeless tobacco: Never Used  Substance Use Topics  . Alcohol use: No  . Drug use: No     Allergies   Patient has no known allergies.   Review of Systems Review of Systems  Constitutional: Negative for chills and fever.  HENT: Negative for congestion.   Eyes: Negative for visual disturbance.  Respiratory: Negative for cough and shortness of breath.   Cardiovascular: Negative for chest pain.  Gastrointestinal: Positive for abdominal pain, diarrhea, nausea and vomiting. Negative for abdominal distention, blood in stool and constipation.  Genitourinary: Negative for dysuria, flank pain, frequency, hematuria, urgency, vaginal bleeding and vaginal discharge.  Musculoskeletal: Negative for arthralgias and myalgias.  Skin: Negative for rash.  Neurological: Positive for  speech difficulty. Negative for dizziness, syncope, facial asymmetry, weakness, light-headedness, numbness and headaches.  Psychiatric/Behavioral: Negative for sleep disturbance. The patient is not nervous/anxious.      Physical Exam Updated Vital Signs BP 115/85   Pulse 84   Temp 97.7 F (36.5 C) (Oral)   Resp 17   SpO2 100%   Physical Exam  Constitutional: She appears well-developed and well-nourished.  Non-toxic appearance. No distress.  HENT:  Head: Normocephalic and atraumatic.  Nose: Nose normal.  Mouth/Throat: Oropharynx is clear and moist.  Eyes: Conjunctivae are normal. Pupils are equal, round, and reactive to light. Right eye exhibits no discharge. Left eye exhibits no discharge.  Neck: Normal range of motion. Neck supple.  Cardiovascular: Normal rate, regular rhythm, normal heart sounds and intact distal pulses. Exam reveals no gallop and no friction rub.  No murmur heard. Pulmonary/Chest: Effort normal and breath sounds  normal. No stridor. No respiratory distress. She has no wheezes. She has no rales. She exhibits no tenderness.  Abdominal: Soft. Bowel sounds are normal. She exhibits no distension. There is no tenderness. There is no rigidity, no rebound, no guarding, no CVA tenderness, no tenderness at McBurney's point and negative Murphy's sign.  Musculoskeletal: Normal range of motion. She exhibits no tenderness.  Lymphadenopathy:    She has no cervical adenopathy.  Neurological: She is alert.  Patient oriented to person and place but not time which is baseline per family.  Patient does have some slurred speech with difficulties with word finding.  However answers questions appropriately.  Cranial nerve II-VII grossly intact.  The patient does have some horizontal nystagmus with EOMs. Sensation intact. Strength 5/5 in all extremities. Reflexes 2+ and symmetric at biceps, triceps, knees, and ankles. Rapid alternating movement and fine finger movements intact.  Romberg and gait not assessed due to safety.   Skin: Skin is warm and dry. Capillary refill takes less than 2 seconds.  Psychiatric: Her behavior is normal. Judgment and thought content normal.  Nursing note and vitals reviewed.    ED Treatments / Results  Labs (all labs ordered are listed, but only abnormal results are displayed) Labs Reviewed  CBC - Abnormal; Notable for the following components:      Result Value   RBC 2.71 (*)    Hemoglobin 8.9 (*)    HCT 28.4 (*)    MCV 104.8 (*)    RDW 16.1 (*)    All other components within normal limits  COMPREHENSIVE METABOLIC PANEL - Abnormal; Notable for the following components:   Sodium 133 (*)    Chloride 93 (*)    Glucose, Bld 112 (*)    BUN 29 (*)    Creatinine, Ser 11.54 (*)    Albumin 3.0 (*)    ALT 12 (*)    GFR calc non Af Amer 3 (*)    GFR calc Af Amer 4 (*)    All other components within normal limits  HCG, QUANTITATIVE, PREGNANCY - Abnormal; Notable for the following components:    hCG, Beta Chain, Quant, S 8 (*)    All other components within normal limits  I-STAT CHEM 8, ED - Abnormal; Notable for the following components:   Chloride 92 (*)    BUN 26 (*)    Creatinine, Ser 10.90 (*)    Glucose, Bld 113 (*)    Hemoglobin 9.9 (*)    HCT 29.0 (*)    All other components within normal limits  I-STAT BETA HCG BLOOD, ED (MC, WL, AP  ONLY) - Abnormal; Notable for the following components:   I-stat hCG, quantitative 6.4 (*)    All other components within normal limits  PROTIME-INR  APTT  DIFFERENTIAL  VALPROIC ACID LEVEL  I-STAT TROPONIN, ED  CBG MONITORING, ED    EKG  EKG Interpretation  Date/Time:  Sunday October 07 2017 18:04:21 EST Ventricular Rate:  108 PR Interval:  140 QRS Duration: 82 QT Interval:  364 QTC Calculation: 487 R Axis:   -2 Text Interpretation:  Sinus tachycardia Cannot rule out Anterior infarct , age undetermined Abnormal ECG no significant change since Sept 2018 Confirmed by Sherwood Gambler [phone]) on 10/07/2017 6:13:24 PM       Radiology Ct Abdomen Pelvis Wo Contrast  Result Date: 10/07/2017 CLINICAL DATA:  Nausea and vomiting x2 days with diarrhea. Dialysis patient. EXAM: CT ABDOMEN AND PELVIS WITHOUT CONTRAST TECHNIQUE: Multidetector CT imaging of the abdomen and pelvis was performed following the standard protocol without IV contrast. COMPARISON:  07/15/2012 abdomen ultrasound. FINDINGS: Lower chest: Top-normal size heart minimal coronary arteriosclerosis. No pericardial effusion. Respiratory motion limits assessment of the lung bases. No pneumonic consolidation, effusion or pneumothorax. Hepatobiliary: Uncomplicated cholelithiasis with 11 mm gallstone near the neck of the gallbladder by without gallbladder wall thickening or distention. The unenhanced liver is unremarkable. Pancreas: Unremarkable. No pancreatic ductal dilatation or surrounding inflammatory changes. Spleen: Normal in size without focal abnormality. Adrenals/Urinary  Tract: Normal bilateral adrenal glands. No nephrolithiasis. Hypodense lesions of both kidneys statistically consistent with cysts are noted. There are slightly exophytic hyper and isodense lesions in the upper and lower pole of the right kidney, nonspecific but may represent proteinaceous or hemorrhagic complex cysts, the upper pole lesion measuring 8 mm and the interpolar lesion measuring approximately 13 mm. A third exophytic isodense lesion in the lower pole the right kidney measuring 10 mm is also noted. Nonemergent dedicated renal CT or MRI without and with contrast for better characterization is recommended and to exclude a solid abnormality. 5 mm lower pole renal calculus with a few punctate interpolar cortical calcifications within the right kidney are identified. No hydroureteronephrosis. The urinary bladder is nondistended which may account for the mild circumferential mural thickening. Stomach/Bowel: Stomach is within normal limits. Appendix appears normal. No evidence of bowel wall thickening, distention, or inflammatory changes. Vascular/Lymphatic: Mild aortoiliac and branch vessel atherosclerosis. No lymphadenopathy. Reproductive: Uterus and bilateral adnexa are unremarkable.2 Other: Peritoneal dialysate fluid in the abdomen and pelvis with coiling of a peritoneal dialysis catheter seen in the right hemipelvis. Musculoskeletal: Osseous sclerotic appearance of the bones consistent with renal osteodystrophy. Degenerative disc disease with discogenic endplate change at W2-X9. IMPRESSION: 1. Uncomplicated cholelithiasis. 2. Renal lesions as above some are simple in appearance and others isodense to hyperdense relative to adjacent renal cortex. These may represent complex cysts possibly proteinaceous or hemorrhagic there should be further correlated with nonemergent CT or MRI, the largest is 13 mm off the interpolar aspect of the right kidney. 3. Nonobstructing bilateral renal calculi. The largest is 5 mm  in the lower pole the left kidney. 4. Moderate volume of free dialysate fluid within the abdomen and pelvis with right lower quadrant/ right hemi pelvic peritoneal dialysis catheter in place. 5. Renal osteodystrophy with degenerative disc disease L5-S1. 6. Cardiomegaly with minimal coronary arteriosclerosis. Electronically Signed   By: Ashley Royalty M.D.   On: 10/07/2017 20:53   Ct Head Wo Contrast  Result Date: 10/07/2017 CLINICAL DATA:  Slurred speech EXAM: CT HEAD WITHOUT CONTRAST TECHNIQUE: Contiguous axial images were  obtained from the base of the skull through the vertex without intravenous contrast. COMPARISON:  July 12, 2017 FINDINGS: Brain: There is mild diffuse atrophy. There is no intracranial mass, hemorrhage, extra-axial fluid collection, or midline shift. There is slight small vessel disease in the centra semiovale bilaterally. Elsewhere gray-white compartments appear normal. No evident acute infarct. Vascular: No hyperdense vessel. There are foci of atherosclerotic calcification in each carotid siphon, more on the left than on the right. Skull: The bony calvarium appears intact. Sinuses/Orbits: There is mild mucosal thickening in the medial right maxillary antrum. There is other visualized paranasal sinuses are clear. There is leftward deviation of the anterior nasal septum. Orbits appear symmetric bilaterally. Other: Mastoid air cells are clear. There is debris in each external auditory canal. IMPRESSION: Mild diffuse atrophy with mild periventricular small vessel disease. No evident acute infarct. No mass or hemorrhage. There are foci of arterial vascular calcification. There is slight right maxillary sinus disease. There is a deviated nasal septum. There is probable cerumen in each external auditory canal. Electronically Signed   By: Lowella Grip III M.D.   On: 10/07/2017 20:30    Procedures Procedures (including critical care time)  Medications Ordered in ED Medications    fosPHENYtoin (CEREBYX) 1,250 mg PE in sodium chloride 0.9 % 50 mL IVPB (not administered)     Initial Impression / Assessment and Plan / ED Course  I have reviewed the triage vital signs and the nursing notes.  Pertinent labs & imaging results that were available during my care of the patient were reviewed by me and considered in my medical decision making (see chart for details).     Patient presents to the emergency department today with family at bedside for evaluation of slurred speech.  Family notes that the patient's speech at baseline is normal.  Patient of note also complains of some generalized abdominal pain with nausea, vomiting, diarrhea.  Patient is an end-stage renal patient that is currently receiving peritoneal dialysis.  Patient has a history of seizures and is currently taking Keppra and Dilantin.  On exam patient is overall well-appearing and nontoxic.  Vital signs are reassuring.  Patient is afebrile, no tachycardia, no hypotension noted.  She has no focal abdominal tenderness to palpation.  No signs of peritonitis.  Lungs clear to auscultation bilaterally.  Neuro exam reveals some slurred speech with difficulty with word finding.  She also has some horizontal nystagmus with EOMs.  Patient has no focal weakness bilaterally.  Follows commands appropriately.  Is oriented to person and place which is baseline for patient.  Stroke workup was initiated.  Along with labs and abdominal CT scan.  Patient has no leukocytosis.  Hemoglobin appears at baseline.  Patient's creatinine is 11 which is baseline for patient.  Potassium is normal.  Liver enzymes are normal.  Troponin is negative.  EKG shows sinus tachycardia without any ischemic changes.  CT scan shows no acute findings.  Does note renal cyst that needs to be followed up in outpatient setting not emergently.  Patient has no infectious signs on CT scan.  CT of head returned that shows no acute abnormalities.  She was discussed  with neurology Dr. Rory Percy.  He ordered fosphenytoin load.  Recommend that she continue her Keppra but did not recommend a Keppra load.  Concern for either CVA versus breakthrough seizure given infectious symptoms.  Has recommended MRI of brain and EEG.  Depending on result of MRI of brain may need to do stat EEG  this evening.  She has no signs of peritonitis.  Repeat abdominal exam is benign.  She denies any pain at this time.  Speech remains slurred.  Will consult hospital medicine for admission.  Spoke with Dr. Alcario Drought with hospital medicine.  He has requested that I order fluid cultures of the peritoneal dialysis fluid.  Have consulted the dialysis nurse who will draw the fluid off on the floor.  Cell counts and cultures were ordered.  Patient remains hemodynamically stable at this time.    Patient discussed with my attending who is agreed with the above plan.    Final Clinical Impressions(s) / ED Diagnoses   Final diagnoses:  Dysarthria  Generalized abdominal pain  Nausea vomiting and diarrhea    ED Discharge Orders    None       Aaron Edelman 10/07/17 2150    Sherwood Gambler, MD 10/08/17 [phone]

## 2017-10-07 NOTE — ED Notes (Addendum)
Pt yelling in room, thrashing around. Sister states she is just overwhelmed at the moment. VSS. IV wrapped to ensure not to lose it.

## 2017-10-07 NOTE — ED Triage Notes (Addendum)
Per family, pt was at fine when she went to bed last night. Woke up this am with slurred speech. Grips seem equal at triage and is answering questions appropriately, following commands, speech is slurred.  Had n/v two days ago and diarrhea today. Pt reports severe abd pain today.

## 2017-10-07 NOTE — ED Notes (Signed)
Add on label sent to main lab for hcgqn blood test.

## 2017-10-07 NOTE — ED Notes (Signed)
Patient transported to CT 

## 2017-10-07 NOTE — H&P (Signed)
History and Physical    Pharrah MARIEM SKOLNICK RCV:[id number] DOB: [birth year] DOA: 10/07/2017  PCP: [name], MD  Patient coming from: Home  I have personally briefly reviewed patient's old medical records in [place]  Chief Complaint: Slurred speech  HPI: Jane [name] is a 51 y.o. female with medical history significant of hypertension, schizophrenia, hyperlipidemia, bipolar disorder, mental retardation, hypothyroidism, end-stage renal disease on peritoneal dialysis brought to the emergency room after she woke up this morning with the family described as slurred speech.  According to them, she usually is able to carry out normal conversations but when she woke up this morning, her speech sounded slurred.  She also seem to be having difficulty finding the right words. She has not had similar symptoms in the past.  Her last seizure was over a year ago.  She is compliant with her medications-Depakote 500 twice daily and Keppra 500 twice daily.  Also complaints of abd pain, intermittent episodes of vomiting over past few days.  Apparently she intermittently has abd pain from time to time.  No fevers no chills.  PD fluid is clear.   ED Course: No SIRS.  CT head neg.  Neurology suspects underlying breakthrough seizure as cause.  Got 1x dose of fosphenytoin.   Review of Systems: As per HPI otherwise 10 point review of systems negative.   Past Medical History:  Diagnosis Date  . Anemia   . Bipolar disorder ([place])   . Depression   . ESRD on peritoneal dialysis ([place])   . Hyperlipemia   . Hypertension   . Hypothyroidism   . Moderately mentally retarded   . Renal disease   . Schizophrenia ([place])   . Seizures ([place])    No recent seizures - no meds    Past Surgical History:  Procedure Laterality Date  . BREAST REDUCTION SURGERY    . BREAST SURGERY     breast reduction  . EXAMINATION UNDER ANESTHESIA N/A 12/08/2013   Procedure: EXAM UNDER ANESTHESIA;  Surgeon: Lavonia Drafts, MD;  Location: [facility];  Service: Gynecology;  Laterality: N/A;  Pelvic exam and pap smear, unable to tolerate during last office visit  . FOOT SURGERY     right     reports that  has never smoked. she has never used smokeless tobacco. She reports that she does not drink alcohol or use drugs.  No Known Allergies  Family History  Problem Relation Age of Onset  . Hypertension Other      Prior to Admission medications   Medication Sig Start Date End Date Taking? Authorizing Provider  B Complex-C-Folic Acid (DIALYVITE 102) 0.8 MG TABS Take 1 tablet by mouth daily.    [provider]  benztropine (COGENTIN) 1 MG tablet Take 1 mg by mouth at bedtime.    [provider]  calcitRIOL (ROCALTROL) 0.25 MCG capsule Take 0.25 mcg by mouth daily.    [provider]  cinacalcet (SENSIPAR) 30 MG tablet Take 30-60 mg by mouth See admin instructions. Take 1 tablet every morning and take 2 tablets every evening    [provider]  dicyclomine (BENTYL) 20 MG tablet Take 1 tablet (20 mg total) by mouth 2 (two) times daily. 06/21/16   Delsa Grana, PA-C  divalproex (DEPAKOTE) 250 MG DR tablet Take 3 tablets (750 mg total) by mouth every 12 (twelve) hours. 04/22/16   Caren Griffins, MD  divalproex (DEPAKOTE) 500 MG DR tablet Take 500 mg by mouth 2 (two) times  daily.    [provider]  iron polysaccharides (NIFEREX) 150 MG capsule Take 150 mg by mouth every other day.    [provider]  levETIRAcetam (KEPPRA) 500 MG tablet Take 1 tablet (500 mg total) by mouth 2 (two) times daily. 09/17/15   Charlynne Cousins, MD  medroxyPROGESTERone (DEPO-PROVERA) 150 MG/ML injection Inject 150 mg into the muscle every 3 (three) months.     [provider]  ondansetron (ZOFRAN ODT) 4 MG disintegrating tablet Take 1 tablet (4 mg total) by mouth every 8 (eight) hours as needed for nausea or vomiting. 06/21/16   Delsa Grana, PA-C  potassium chloride  SA (K-DUR,KLOR-CON) 20 MEQ tablet Take 20 mEq by mouth daily.    [provider]  risperiDONE (RISPERDAL) 2 MG tablet Take 2 mg by mouth at bedtime.    [provider]  sevelamer carbonate (RENVELA) 800 MG tablet Take 800 mg by mouth 3 (three) times daily with meals.    [provider]  vitamin B-12 (CYANOCOBALAMIN) 500 MCG tablet Take 500 mcg by mouth daily.    [provider]    Physical Exam: Vitals:   10/07/17 2100 10/07/17 2115 10/07/17 2130 10/07/17 2145  BP:      Pulse: 91 89 96 (!) 102  Resp: (!) 21 16 20  (!) 29  Temp:      TempSrc:      SpO2: 94% 100% 100% 99%    Constitutional: NAD, calm, comfortable Eyes: PERRL, lids and conjunctivae normal ENMT: Mucous membranes are moist. Posterior pharynx clear of any exudate or lesions.Normal dentition.  Neck: normal, supple, no masses, no thyromegaly Respiratory: clear to auscultation bilaterally, no wheezing, no crackles. Normal respiratory effort. No accessory muscle use.  Cardiovascular: Regular rate and rhythm, no murmurs / rubs / gallops. No extremity edema. 2+ pedal pulses. No carotid bruits.  Abdomen: no tenderness, no masses palpated. No hepatosplenomegaly. Bowel sounds positive.  Musculoskeletal: no clubbing / cyanosis. No joint deformity upper and lower extremities. Good ROM, no contractures. Normal muscle tone.  Skin: no rashes, lesions, ulcers. No induration Neurologic: Oriented to self and place, not time.  Speech is dysarthric.  5/5 BUE motor strength, sensation intact. Psychiatric: Normal judgment and insight. Alert and oriented x 3. Normal mood.    Labs on Admission: I have personally reviewed following labs and imaging studies  CBC: Recent Labs  Lab 10/07/17 1815 10/07/17 1829  WBC 7.4  --   NEUTROABS 4.9  --   HGB 8.9* 9.9*  HCT 28.4* 29.0*  MCV 104.8*  --   PLT 303  --    Basic Metabolic Panel: Recent Labs  Lab 10/07/17 1815 10/07/17 1829  NA 133* 136  K 3.8 3.9    CL 93* 92*  CO2 26  --   GLUCOSE 112* 113*  BUN 29* 26*  CREATININE 11.54* 10.90*  CALCIUM 9.4  --    GFR: CrCl cannot be calculated (Unknown ideal weight.). Liver Function Tests: Recent Labs  Lab 10/07/17 1815  AST 19  ALT 12*  ALKPHOS 110  BILITOT 0.5  PROT 7.2  ALBUMIN 3.0*   No results for input(s): LIPASE, AMYLASE in the last 168 hours. No results for input(s): AMMONIA in the last 168 hours. Coagulation Profile: Recent Labs  Lab 10/07/17 1815  INR 1.10   Cardiac Enzymes: No results for input(s): CKTOTAL, CKMB, CKMBINDEX, TROPONINI in the last 168 hours. BNP (last 3 results) No results for input(s): PROBNP in the last 8760 hours. HbA1C:  No results for input(s): HGBA1C in the last 72 hours. CBG: Recent Labs  Lab 10/07/17 2130  GLUCAP 100*   Lipid Profile: No results for input(s): CHOL, HDL, LDLCALC, TRIG, CHOLHDL, LDLDIRECT in the last 72 hours. Thyroid Function Tests: No results for input(s): TSH, T4TOTAL, FREET4, T3FREE, THYROIDAB in the last 72 hours. Anemia Panel: No results for input(s): VITAMINB12, FOLATE, FERRITIN, TIBC, IRON, RETICCTPCT in the last 72 hours. Urine analysis:    Component Value Date/Time   COLORURINE YELLOW 06/20/2016 2109   APPEARANCEUR CLOUDY (A) 06/20/2016 2109   LABSPEC 1.011 06/20/2016 2109   PHURINE 8.0 06/20/2016 2109   GLUCOSEU NEGATIVE 06/20/2016 2109   HGBUR NEGATIVE 06/20/2016 2109   HGBUR trace-intact 04/12/2010 0904   BILIRUBINUR NEGATIVE 06/20/2016 2109   KETONESUR NEGATIVE 06/20/2016 2109   PROTEINUR NEGATIVE 06/20/2016 2109   UROBILINOGEN 1.0 07/14/2012 2005   NITRITE NEGATIVE 06/20/2016 2109   LEUKOCYTESUR NEGATIVE 06/20/2016 2109    Radiological Exams on Admission: Ct Abdomen Pelvis Wo Contrast  Result Date: 10/07/2017 CLINICAL DATA:  Nausea and vomiting x2 days with diarrhea. Dialysis patient. EXAM: CT ABDOMEN AND PELVIS WITHOUT CONTRAST TECHNIQUE: Multidetector CT imaging of the abdomen and pelvis was  performed following the standard protocol without IV contrast. COMPARISON:  07/15/2012 abdomen ultrasound. FINDINGS: Lower chest: Top-normal size heart minimal coronary arteriosclerosis. No pericardial effusion. Respiratory motion limits assessment of the lung bases. No pneumonic consolidation, effusion or pneumothorax. Hepatobiliary: Uncomplicated cholelithiasis with 11 mm gallstone near the neck of the gallbladder by without gallbladder wall thickening or distention. The unenhanced liver is unremarkable. Pancreas: Unremarkable. No pancreatic ductal dilatation or surrounding inflammatory changes. Spleen: Normal in size without focal abnormality. Adrenals/Urinary Tract: Normal bilateral adrenal glands. No nephrolithiasis. Hypodense lesions of both kidneys statistically consistent with cysts are noted. There are slightly exophytic hyper and isodense lesions in the upper and lower pole of the right kidney, nonspecific but may represent proteinaceous or hemorrhagic complex cysts, the upper pole lesion measuring 8 mm and the interpolar lesion measuring approximately 13 mm. A third exophytic isodense lesion in the lower pole the right kidney measuring 10 mm is also noted. Nonemergent dedicated renal CT or MRI without and with contrast for better characterization is recommended and to exclude a solid abnormality. 5 mm lower pole renal calculus with a few punctate interpolar cortical calcifications within the right kidney are identified. No hydroureteronephrosis. The urinary bladder is nondistended which may account for the mild circumferential mural thickening. Stomach/Bowel: Stomach is within normal limits. Appendix appears normal. No evidence of bowel wall thickening, distention, or inflammatory changes. Vascular/Lymphatic: Mild aortoiliac and branch vessel atherosclerosis. No lymphadenopathy. Reproductive: Uterus and bilateral adnexa are unremarkable.2 Other: Peritoneal dialysate fluid in the abdomen and pelvis with  coiling of a peritoneal dialysis catheter seen in the right hemipelvis. Musculoskeletal: Osseous sclerotic appearance of the bones consistent with renal osteodystrophy. Degenerative disc disease with discogenic endplate change at V6-H6. IMPRESSION: 1. Uncomplicated cholelithiasis. 2. Renal lesions as above some are simple in appearance and others isodense to hyperdense relative to adjacent renal cortex. These may represent complex cysts possibly proteinaceous or hemorrhagic there should be further correlated with nonemergent CT or MRI, the largest is 13 mm off the interpolar aspect of the right kidney. 3. Nonobstructing bilateral renal calculi. The largest is 5 mm in the lower pole the left kidney. 4. Moderate volume of free dialysate fluid within the abdomen and pelvis with right lower quadrant/ right hemi pelvic peritoneal dialysis catheter in place. 5. Renal osteodystrophy with  degenerative disc disease L5-S1. 6. Cardiomegaly with minimal coronary arteriosclerosis. Electronically Signed   By: Ashley Royalty M.D.   On: 10/07/2017 20:53   Ct Head Wo Contrast  Result Date: 10/07/2017 CLINICAL DATA:  Slurred speech EXAM: CT HEAD WITHOUT CONTRAST TECHNIQUE: Contiguous axial images were obtained from the base of the skull through the vertex without intravenous contrast. COMPARISON:  July 12, 2017 FINDINGS: Brain: There is mild diffuse atrophy. There is no intracranial mass, hemorrhage, extra-axial fluid collection, or midline shift. There is slight small vessel disease in the centra semiovale bilaterally. Elsewhere gray-white compartments appear normal. No evident acute infarct. Vascular: No hyperdense vessel. There are foci of atherosclerotic calcification in each carotid siphon, more on the left than on the right. Skull: The bony calvarium appears intact. Sinuses/Orbits: There is mild mucosal thickening in the medial right maxillary antrum. There is other visualized paranasal sinuses are clear. There is  leftward deviation of the anterior nasal septum. Orbits appear symmetric bilaterally. Other: Mastoid air cells are clear. There is debris in each external auditory canal. IMPRESSION: Mild diffuse atrophy with mild periventricular small vessel disease. No evident acute infarct. No mass or hemorrhage. There are foci of arterial vascular calcification. There is slight right maxillary sinus disease. There is a deviated nasal septum. There is probable cerumen in each external auditory canal. Electronically Signed   By: Lowella Grip III M.D.   On: 10/07/2017 20:30    EKG: Independently reviewed.  Assessment/Plan Principal Problem:   Slurred speech Active Problems:   Schizoaffective disorder (HCC)   RETARDATION, MENTAL NOS   End-stage renal disease on peritoneal dialysis (HCC)   Seizure disorder ([place])    1. Slurred speech - 1. Neurology suspects underlying breakthrough seizure 1. DDx includes stroke, TME, underlying infection 2. MRI brain 3. EEG in AM 4. UA and UDS 5. Will have dialysis draw PD fluid and send to lab for cell count etc to r/o peritonitis.  Not empirically treating this at the moment though since patient has 0 SIRS at the moment and ABD pain complaints do seem to be somewhat chronic intermittently. 2. Seizure disorder - 1. Continue Keppra 500 bid and Depakote 500 BID 2. Depakote Level 3. Single dose of fosphenytoin 4. Seizure precautions 3. ESRD on PD - 1. Left message with nephrology for PD while in hospital 4. Schizoaffective disorder - 1. Continue home meds when med rec completed  DVT prophylaxis: Heparin [place] Code Status: Full Family Communication: Family at bedside Disposition Plan: Home after admit Consults called: Neuro, left message with nephrology Admission status: Place in obs   Valree Feild, [place] [facility] Pager [phone]  If 7AM-7PM, please contact day team taking care of patient [email or web address] Password [email or web address]  10/07/2017, 9:58 PM

## 2017-10-07 NOTE — ED Notes (Signed)
Return from CT

## 2017-10-08 ENCOUNTER — Other Ambulatory Visit: Payer: Self-pay

## 2017-10-08 ENCOUNTER — Ambulatory Visit (HOSPITAL_COMMUNITY): Payer: Medicare Other

## 2017-10-08 ENCOUNTER — Observation Stay (HOSPITAL_COMMUNITY): Payer: Medicare Other

## 2017-10-08 DIAGNOSIS — Z992 Dependence on renal dialysis: Secondary | ICD-10-CM

## 2017-10-08 DIAGNOSIS — R197 Diarrhea, unspecified: Secondary | ICD-10-CM

## 2017-10-08 DIAGNOSIS — R1084 Generalized abdominal pain: Secondary | ICD-10-CM

## 2017-10-08 DIAGNOSIS — N186 End stage renal disease: Secondary | ICD-10-CM | POA: Diagnosis not present

## 2017-10-08 DIAGNOSIS — I120 Hypertensive chronic kidney disease with stage 5 chronic kidney disease or end stage renal disease: Secondary | ICD-10-CM | POA: Diagnosis present

## 2017-10-08 DIAGNOSIS — F209 Schizophrenia, unspecified: Secondary | ICD-10-CM | POA: Diagnosis present

## 2017-10-08 DIAGNOSIS — E785 Hyperlipidemia, unspecified: Secondary | ICD-10-CM | POA: Diagnosis present

## 2017-10-08 DIAGNOSIS — E039 Hypothyroidism, unspecified: Secondary | ICD-10-CM | POA: Diagnosis present

## 2017-10-08 DIAGNOSIS — R4701 Aphasia: Secondary | ICD-10-CM | POA: Diagnosis present

## 2017-10-08 DIAGNOSIS — F319 Bipolar disorder, unspecified: Secondary | ICD-10-CM | POA: Diagnosis present

## 2017-10-08 DIAGNOSIS — Z8659 Personal history of other mental and behavioral disorders: Secondary | ICD-10-CM | POA: Diagnosis not present

## 2017-10-08 DIAGNOSIS — Z8249 Family history of ischemic heart disease and other diseases of the circulatory system: Secondary | ICD-10-CM | POA: Diagnosis not present

## 2017-10-08 DIAGNOSIS — R112 Nausea with vomiting, unspecified: Secondary | ICD-10-CM | POA: Diagnosis not present

## 2017-10-08 DIAGNOSIS — F259 Schizoaffective disorder, unspecified: Secondary | ICD-10-CM | POA: Diagnosis not present

## 2017-10-08 DIAGNOSIS — R4781 Slurred speech: Secondary | ICD-10-CM | POA: Diagnosis not present

## 2017-10-08 DIAGNOSIS — G40909 Epilepsy, unspecified, not intractable, without status epilepticus: Secondary | ICD-10-CM | POA: Diagnosis not present

## 2017-10-08 DIAGNOSIS — F79 Unspecified intellectual disabilities: Secondary | ICD-10-CM | POA: Diagnosis not present

## 2017-10-08 DIAGNOSIS — D509 Iron deficiency anemia, unspecified: Secondary | ICD-10-CM | POA: Diagnosis present

## 2017-10-08 DIAGNOSIS — D631 Anemia in chronic kidney disease: Secondary | ICD-10-CM | POA: Diagnosis not present

## 2017-10-08 DIAGNOSIS — R4182 Altered mental status, unspecified: Secondary | ICD-10-CM | POA: Diagnosis not present

## 2017-10-08 DIAGNOSIS — N2581 Secondary hyperparathyroidism of renal origin: Secondary | ICD-10-CM | POA: Diagnosis present

## 2017-10-08 DIAGNOSIS — E8889 Other specified metabolic disorders: Secondary | ICD-10-CM | POA: Diagnosis present

## 2017-10-08 LAB — LIPID PANEL
Cholesterol: 149 mg/dL (ref 0–200)
HDL: 36 mg/dL — ABNORMAL LOW
LDL Cholesterol: 73 mg/dL (ref 0–99)
Total CHOL/HDL Ratio: 4.1 ratio
Triglycerides: 201 mg/dL — ABNORMAL HIGH
VLDL: 40 mg/dL (ref 0–40)

## 2017-10-08 LAB — URINALYSIS, ROUTINE W REFLEX MICROSCOPIC
BILIRUBIN URINE: NEGATIVE
GLUCOSE, UA: NEGATIVE mg/dL
HGB URINE DIPSTICK: NEGATIVE
KETONES UR: NEGATIVE mg/dL
NITRITE: NEGATIVE
PROTEIN: NEGATIVE mg/dL
Specific Gravity, Urine: 1.005 (ref 1.005–1.030)
pH: 8 (ref 5.0–8.0)

## 2017-10-08 LAB — GRAM STAIN

## 2017-10-08 LAB — VALPROIC ACID LEVEL: VALPROIC ACID LVL: 44 ug/mL — AB (ref 50.0–100.0)

## 2017-10-08 LAB — HIV ANTIBODY (ROUTINE TESTING W REFLEX): HIV Screen 4th Generation wRfx: NONREACTIVE

## 2017-10-08 MED ORDER — LORAZEPAM 2 MG/ML IJ SOLN
0.5000 mg | Freq: Once | INTRAMUSCULAR | Status: AC
  Administered 2017-10-08: 0.5 mg via INTRAVENOUS
  Filled 2017-10-08: qty 1

## 2017-10-08 MED ORDER — DARBEPOETIN ALFA 40 MCG/0.4ML IJ SOSY
40.0000 ug | PREFILLED_SYRINGE | Freq: Once | INTRAMUSCULAR | Status: DC
  Filled 2017-10-08: qty 0.4

## 2017-10-08 MED ORDER — DARBEPOETIN ALFA 40 MCG/0.4ML IJ SOSY
40.0000 ug | PREFILLED_SYRINGE | INTRAMUSCULAR | Status: AC
  Administered 2017-10-08: 40 ug via SUBCUTANEOUS
  Filled 2017-10-08: qty 0.4

## 2017-10-08 MED ORDER — DELFLEX-LC/1.5% DEXTROSE 344 MOSM/L IP SOLN
Freq: Four times a day (QID) | INTRAPERITONEAL | Status: DC
  Administered 2017-10-09: 3000 mL via INTRAPERITONEAL

## 2017-10-08 MED ORDER — HEPARIN 1000 UNIT/ML FOR PERITONEAL DIALYSIS: 500.0000 [IU] | INTRAMUSCULAR | Status: DC | PRN

## 2017-10-08 MED ORDER — HEPARIN 1000 UNIT/ML FOR PERITONEAL DIALYSIS
INTRAPERITONEAL | Status: DC | PRN
  Filled 2017-10-08: qty 3000

## 2017-10-08 MED ORDER — SODIUM CHLORIDE 0.9 % IV BOLUS (SEPSIS)
250.0000 mL | Freq: Once | INTRAVENOUS | Status: AC
  Administered 2017-10-08: 250 mL via INTRAVENOUS

## 2017-10-08 NOTE — Plan of Care (Signed)
  Progressing Education: Knowledge of General Education information will improve 10/08/2017 1019 - Progressing by Harlin Heys, RN Health Behavior/Discharge Planning: Ability to manage health-related needs will improve 10/08/2017 1019 - Progressing by Harlin Heys, RN Clinical Measurements: Ability to maintain clinical measurements within normal limits will improve 10/08/2017 1019 - Progressing by Harlin Heys, RN Will remain free from infection 10/08/2017 1019 - Progressing by Harlin Heys, RN Diagnostic test results will improve 10/08/2017 1019 - Progressing by Harlin Heys, RN Respiratory complications will improve 10/08/2017 1019 - Progressing by Harlin Heys, RN Cardiovascular complication will be avoided 10/08/2017 1019 - Progressing by Harlin Heys, RN Activity: Risk for activity intolerance will decrease 10/08/2017 1019 - Progressing by Harlin Heys, RN Nutrition: Adequate nutrition will be maintained 10/08/2017 1019 - Progressing by Harlin Heys, RN Coping: Level of anxiety will decrease 10/08/2017 1019 - Progressing by Harlin Heys, RN Elimination: Will not experience complications related to bowel motility 10/08/2017 1019 - Progressing by Harlin Heys, RN Will not experience complications related to urinary retention 10/08/2017 1019 - Progressing by Harlin Heys, RN Pain Managment: General experience of comfort will improve 10/08/2017 1019 - Progressing by Harlin Heys, RN Safety: Ability to remain free from injury will improve 10/08/2017 1019 - Progressing by Harlin Heys, RN Skin Integrity: Risk for impaired skin integrity will decrease 10/08/2017 1019 - Progressing by Harlin Heys, RN Education: Knowledge of disease or condition will improve 10/08/2017 1019 - Progressing by Harlin Heys, RN Knowledge of secondary prevention will improve 10/08/2017 1019 - Progressing by Harlin Heys, RN Knowledge of patient  specific risk factors addressed and post discharge goals established will improve 10/08/2017 1019 - Progressing by Harlin Heys, RN Coping: Will verbalize positive feelings about self 10/08/2017 1019 - Progressing by Harlin Heys, RN Will identify appropriate support needs 10/08/2017 1019 - Progressing by Harlin Heys, [place] Behavior/Discharge Planning: Ability to manage health-related needs will improve 10/08/2017 1019 - Progressing by Harlin Heys, RN Self-Care: Ability to participate in self-care as condition permits will improve 10/08/2017 1019 - Progressing by Harlin Heys, RN Ability to communicate needs accurately will improve 10/08/2017 1019 - Progressing by Harlin Heys, RN Nutrition: Risk of aspiration will decrease 10/08/2017 1019 - Progressing by Harlin Heys, RN Ischemic Stroke/TIA Tissue Perfusion: Complications of ischemic stroke/TIA will be minimized 10/08/2017 1019 - Progressing by Harlin Heys, RN Education: Expressions of having a comfortable level of knowledge regarding the disease process will increase 10/08/2017 1019 - Progressing by Harlin Heys, RN Coping: Ability to adjust to condition or change in health will improve 10/08/2017 1019 - Progressing by Harlin Heys, RN Ability to identify appropriate support needs will improve 10/08/2017 1019 - Progressing by Harlin Heys, [place] Behavior/Discharge Planning: Compliance with prescribed medication regimen will improve 10/08/2017 1019 - Progressing by Harlin Heys, RN Medication: Risk for medication side effects will decrease 10/08/2017 1019 - Progressing by Harlin Heys, RN Clinical Measurements: Complications related to the disease process, condition or treatment will be avoided or minimized 10/08/2017 1019 - Progressing by Harlin Heys, RN Diagnostic test results will improve 10/08/2017 1019 - Progressing by Harlin Heys, RN Safety: Verbalization of  understanding the information provided will improve 10/08/2017 1019 - Progressing by Harlin Heys, RN Self-Concept: Level of anxiety will decrease 10/08/2017 1019 - Progressing by Harlin Heys, RN Ability to verbalize feelings about condition will improve 10/08/2017 1019 - Progressing by Harlin Heys, RN

## 2017-10-08 NOTE — Evaluation (Signed)
Speech Language Pathology Evaluation Patient Details Name: Jane [name] MRN: [id number] DOB: [birth year] Today's Date: 10/08/2017 Time: 7510-2585 SLP Time Calculation (min) (ACUTE ONLY): 9 min  Problem List:  Patient Active Problem List   Diagnosis Date Noted  . Slurred speech 10/07/2017  . OSA (obstructive sleep apnea) 07/25/2017  . Seizure disorder ([place]) 04/22/2016  . End-stage renal disease on peritoneal dialysis ([place])   . Partial seizure ([place])   . Seizure ([place]) 09/14/2015  . Essential hypertension 10/11/2010  . FOOT PAIN, RIGHT 10/11/2010  . BRONCHITIS, ACUTE WITH MILD BRONCHOSPASM 10/04/2010  . ANKLE SPRAIN, RIGHT 02/20/2010  . WEIGHT GAIN 06/17/2009  . Dermatophytosis of foot 04/16/2009  . VAGINAL DISCHARGE 04/16/2009  . INSOMNIA 04/16/2009  . POLYDIPSIA 04/16/2009  . FREQUENCY, URINARY 04/16/2009  . ANEMIA, IRON DEFICIENCY 12/03/2007  . DYSLIPIDEMIA 06/11/2007  . OBESITY NOS 06/11/2007  . Schizoaffective disorder ([place]) 06/11/2007  . DISORDER, BIPOLAR NOS 06/11/2007  . RETARDATION, MENTAL NOS 06/11/2007  . RENAL INSUFFICIENCY, CHRONIC 06/11/2007  . ACNE NEC 06/11/2007  . URINARY INCONTINENCE, URGE 06/11/2007  . REDUCTION MAMMOPLASTY, HX OF 06/11/2007   Past Medical History:  Past Medical History:  Diagnosis Date  . Anemia   . Bipolar disorder ([place])   . Depression   . ESRD on peritoneal dialysis ([place])   . Hyperlipemia   . Hypertension   . Hypothyroidism   . Moderately mentally retarded   . Renal disease   . Schizophrenia ([place])   . Seizures ([place])    No recent seizures - no meds   Past Surgical History:  Past Surgical History:  Procedure Laterality Date  . BREAST REDUCTION SURGERY    . BREAST SURGERY     breast reduction  . EXAMINATION UNDER ANESTHESIA N/A 12/08/2013   Procedure: EXAM UNDER ANESTHESIA;  Surgeon: Lavonia Drafts, MD;  Location: [facility];  Service: Gynecology;  Laterality: N/A;  Pelvic exam and pap smear, unable to tolerate during  last office visit  . FOOT SURGERY     right   HPI:  51 y.o.femalewith medical history significant ofhypertension, schizophrenia, hyperlipidemia, bipolar disorder, mental retardation, hypothyroidism, end-stage renal disease on peritoneal dialysis brought to the emergency room after she woke up this morning with the family described as slurred speech. CT showing Mild diffuse atrophy with mild periventricular small vessel disease. No evident acute infarct. No mass or hemorrhage. MRI no acute process.   Assessment / Plan / Recommendation Clinical Impression  Pt's sister stated, during an earlier attempt, that pt has not been speaking this much this admission. SLP returned, sister reported that she has begun talking this afternoon. Assessment limited as HD RN working with pt. At baseline pt is not oriented to date. She was oriented to self and place. Decreased attention to speaker. Sister feels that pt is returning to her baseline. No further ST needed. Educated sister re: outpatient ST if needed once home.         SLP Assessment  SLP Recommendation/Assessment: Patient does not need any further Speech Lanaguage Pathology Services    Follow Up Recommendations  None    Frequency and Duration           SLP Evaluation Cognition  Overall Cognitive Status: History of cognitive impairments - at baseline Arousal/Alertness: Lethargic Orientation Level: Oriented to person(not oriented to time baseline) Attention: Sustained Sustained Attention: Impaired Sustained Attention Impairment: Verbal basic Memory: (not formally assessed) Awareness: Impaired Awareness Impairment: Intellectual impairment;Emergent impairment;Anticipatory impairment Problem Solving: Impaired Problem Solving Impairment: Verbal basic Safety/Judgment:  Impaired       Comprehension  Auditory Comprehension Overall Auditory Comprehension: Appears within functional limits for tasks assessed Visual  Recognition/Discrimination Discrimination: Not tested Reading Comprehension Reading Status: Not tested    Expression Expression Primary Mode of Expression: Verbal Verbal Expression Overall Verbal Expression: Appears within functional limits for tasks assessed Initiation: No impairment Level of Generative/Spontaneous Verbalization: Phrase Repetition: (NT) Naming: Not tested Pragmatics: Impairment Impairments: Eye contact Interfering Components: Attention Written Expression Dominant Hand: Right Written Expression: Not tested   Oral / Motor  Oral Motor/Sensory Function Overall Oral Motor/Sensory Function: Within functional limits Motor Speech Overall Motor Speech: Appears within functional limits for tasks assessed Respiration: Within functional limits Phonation: Normal Resonance: Within functional limits Articulation: Within functional limitis Intelligibility: Intelligible Motor Planning: Witnin functional limits   GO          Functional Assessment Tool Used: skilled clinical judgement         Houston Siren 10/08/2017, 3:35 PM  Orbie Pyo Artasia Thang M.Ed [employer] [phone]

## 2017-10-08 NOTE — Progress Notes (Signed)
Unable to do EEG at this time due to pt undergoing sterile dialysis procedure. Will try again as schedule permits.

## 2017-10-08 NOTE — Evaluation (Signed)
Occupational Therapy Evaluation Patient Details Name: Jane [name] MRN: [id number] DOB: 08/29/1966 Today's Date: 10/08/2017    History of Present Illness 51 y.o.femalewith medical history significant ofhypertension, schizophrenia, hyperlipidemia, bipolar disorder, mental retardation, hypothyroidism, end-stage renal disease on peritoneal dialysis brought to the emergency room after she woke up this morning with the family described as slurred speech. CT showing Mild diffuse atrophy with mild periventricular small vessel disease. No evident acute infarct. No mass or hemorrhage. Awaiting MRI.   Clinical Impression   PTA, pt was living with her sister and was independent with her BADLs. Pt going to day program on week days. Pt performing ADLs at [facility] level for safety. Pt would benefit from further acute OT to facilitate safe dc and address safe functional transfer. Recommend dc home once medically stable per physician with 24 hour supervision.    Follow Up Recommendations  No OT follow up;Supervision/Assistance - 24 hour    Equipment Recommendations  3 in 1 bedside commode    Recommendations for Other Services PT consult     Precautions / Restrictions Precautions Precautions: Fall Precaution Comments: Intellectual disability Restrictions Weight Bearing Restrictions: No      Mobility Bed Mobility Overal bed mobility: Needs Assistance Bed Mobility: Supine to Sit;Sit to Supine     Supine to sit: Supervision Sit to supine: Supervision   General bed mobility comments: Pt performing bed mobility with supervision for safety.   Transfers Overall transfer level: Needs assistance   Transfers: Sit to/from Stand Sit to Stand: Min guard         General transfer comment: Min Guard for safety    Balance Overall balance assessment: Needs assistance Sitting-balance support: No upper extremity supported;Feet supported Sitting balance-Leahy Scale: Fair     Standing  balance support: No upper extremity supported;During functional activity Standing balance-Leahy Scale: Fair                             ADL either performed or assessed with clinical judgement   ADL Overall ADL's : Needs assistance/impaired Eating/Feeding: Set up;Sitting;Supervision/ safety   Grooming: Min guard;Oral care;Wash/dry face;Standing Grooming Details (indicate cue type and reason): Min Guard for safety while performing grooming at sink. Pt requiring increased time.  Upper Body Bathing: Cueing for sequencing;Min guard;Standing   Lower Body Bathing: Min guard;Sit to/from stand   Upper Body Dressing : Min guard;Standing;Cueing for sequencing Upper Body Dressing Details (indicate cue type and reason): Pt doffing while standing with Min cues to untie gown.  Lower Body Dressing: Min guard;Sit to/from stand Lower Body Dressing Details (indicate cue type and reason): Able to pull up socks while in bed               General ADL Comments: Pt performing ADLs with Min Guard A for safety in standing. Pt presenting near baseline funciton with slight decrease in balance.      Vision         Perception     Praxis      Pertinent Vitals/Pain Pain Assessment: Faces Faces Pain Scale: No hurt Pain Intervention(s): Monitored during session     Hand Dominance Right   Extremity/Trunk Assessment Upper Extremity Assessment Upper Extremity Assessment: Overall WFL for tasks assessed   Lower Extremity Assessment Lower Extremity Assessment: Defer to PT evaluation   Cervical / Trunk Assessment Cervical / Trunk Assessment: Normal   Communication Communication Communication: Expressive difficulties;Other (comment)(Intellectual disability)   Cognition Arousal/Alertness: Awake/alert Behavior  During Therapy: Flat affect Overall Cognitive Status: History of cognitive impairments - at baseline                                 General Comments: Pt with  history of  intellectual disability, bipolar, and schezophrenia. Pt following simple commands and benefits from increased time and simple cues. Upon initiating mobility in hall way, pt became upset and started to cry. Sister confirms this is baseline behavior when pt does not want to perform a certain activity.    General Comments  Sister present throughout session    Exercises     Shoulder Instructions      Home Living Family/patient expects to be discharged to:: Private residence Living Arrangements: Parent;Other relatives(Sister) Available Help at Discharge: Family;Available 24 hours/day Type of Home: House Home Access: Stairs to enter [employer] of Steps: 4 Entrance Stairs-Rails: Right;Left Home Layout: Two level;Able to live on main level with bedroom/bathroom Alternate Level Stairs-Number of Steps: 16   Bathroom Shower/Tub: Tub/shower unit   Bathroom Toilet: Standard(Low)     Home Equipment: None          Prior Functioning/Environment Level of Independence: Independent        Comments: Independent with BADLs. Sister does IADLs including cooking and driving. Goes to a day program Monday-Friday. Rides [employer] bus to day program        OT Problem List: Decreased activity tolerance;Impaired balance (sitting and/or standing);Decreased knowledge of use of DME or AE;Decreased safety awareness      OT Treatment/Interventions: Self-care/ADL training;Therapeutic exercise;Manual therapy;DME and/or AE instruction;Therapeutic activities;Patient/family education    OT Goals(Current goals can be found in the care plan section) Acute Rehab OT Goals Patient Stated Goal: Unstated OT Goal Formulation: With patient/family Time For Goal Achievement: 10/22/17 Potential to Achieve Goals: Good ADL Goals Pt Will Perform Grooming: with modified independence;standing Pt Will Transfer to Toilet: with modified independence;ambulating;bedside commode Pt Will Perform Tub/Shower  Transfer: Tub transfer;ambulating;with modified independence  OT Frequency: Min 2X/week   Barriers to D/C:            Co-evaluation              AM-PAC PT "6 Clicks" Daily Activity     Outcome Measure Help from another person eating meals?: None Help from another person taking care of personal grooming?: A Little Help from another person toileting, which includes using toliet, bedpan, or urinal?: A Little Help from another person bathing (including washing, rinsing, drying)?: A Little Help from another person to put on and taking off regular upper body clothing?: None Help from another person to put on and taking off regular lower body clothing?: A Little 6 Click Score: 20   End of Session Equipment Utilized During Treatment: Gait belt Nurse Communication: Mobility status  Activity Tolerance: Patient tolerated treatment well Patient left: in bed;with call bell/phone within reach;with bed alarm set;with family/visitor present  OT Visit Diagnosis: Unsteadiness on feet (R26.81);Other abnormalities of gait and mobility (R26.89);Muscle weakness (generalized) (M62.81);Other symptoms and signs involving cognitive function                Time: 1610-9604 OT Time Calculation (min): 13 min Charges:  OT General Charges $OT Visit: 1 Visit OT Evaluation $OT Eval Moderate Complexity: 1 Mod G-Codes: OT G-codes **NOT FOR INPATIENT CLASS** Functional Assessment Tool Used: Clinical judgement Functional Limitation: Self care Self Care Current Status (V4098): At least 20 percent but  less than 40 percent impaired, limited or restricted Self Care Goal Status (C1638): At least 1 percent but less than 20 percent impaired, limited or restricted   [place], OTR/L Acute Rehab Pager: [phone] Office: [place] 10/08/2017, 9:23 AM

## 2017-10-08 NOTE — Consult Note (Addendum)
[facility] Renal Consultation Note    Indication for Consultation:  Management of ESRD/hemodialysis; anemia, hypertension/volume and secondary hyperparathyroidism PCP: [name], MD   HPI: Jane [name] is a 51 y.o. female with ESRD on CAPD with HTN, schizophrenia, seizure disorder, bipolar disorder on peritoneal dialysis - followed in [place].  She resides with her sister who does her PD for her. She woke up yesterday am per family with slurred speech and problems with word findings. She also complained of abdominal pain with intermittent episodes of vomiting over the past few days.  Her sister reports that she does have episodes of abdominal pain from time to time but no history of perotinitis.  She has no fever, chills and PD fluid is reportedly clear. She usually goes to adult day care during the week.  Work up in the ED showed unremarkable chemistries and CBC given disease process, Trop 0.01  Abdominal CT showed variable multiple cysts, noobstructing renal calculi and uncomplicated cholelithiasis. Head CT showed no acute findings. Neuro was consulted. She is admitted to OBS status.  Past Medical History:  Diagnosis Date  . Anemia   . Bipolar disorder ([place])   . Depression   . ESRD on peritoneal dialysis ([place])   . Hyperlipemia   . Hypertension   . Hypothyroidism   . Moderately mentally retarded   . Renal disease   . Schizophrenia ([place])   . Seizures ([place])    No recent seizures - no meds   Past Surgical History:  Procedure Laterality Date  . BREAST REDUCTION SURGERY    . BREAST SURGERY     breast reduction  . EXAMINATION UNDER ANESTHESIA N/A 12/08/2013   Procedure: EXAM UNDER ANESTHESIA;  Surgeon: Lavonia Drafts, MD;  Location: [facility];  Service: Gynecology;  Laterality: N/A;  Pelvic exam and pap smear, unable to tolerate during last office visit  . FOOT SURGERY     right   Family History  Problem Relation Age of Onset  . Hypertension  Other    Social History:  reports that  has never smoked. she has never used smokeless tobacco. She reports that she does not drink alcohol or use drugs. No Known Allergies Prior to Admission medications   Medication Sig Start Date End Date Taking? Authorizing Provider  albuterol (PROVENTIL HFA;VENTOLIN HFA) 108 (90 Base) MCG/ACT inhaler Inhale 2 puffs into the lungs every 6 (six) hours as needed for wheezing or shortness of breath.   Yes [provider]  B Complex-C-Folic Acid (DIALYVITE 841) 0.8 MG TABS Take 0.8 mg by mouth daily.    Yes [provider]  benztropine (COGENTIN) 1 MG tablet Take 1 mg by mouth at bedtime.   Yes [provider]  cinacalcet (SENSIPAR) 30 MG tablet Take 30-60 mg by mouth See admin instructions. Take 1 tablet (30 mg) by mouth with breakfast and 2 tablets (60 mg) with supper   Yes [provider]  divalproex (DEPAKOTE) 500 MG DR tablet Take 500 mg by mouth 2 (two) times daily.   Yes [provider]  iron polysaccharides (NIFEREX) 150 MG capsule Take 150 mg by mouth every other day.   Yes [provider]  levETIRAcetam (KEPPRA) 500 MG tablet Take 1 tablet (500 mg total) by mouth 2 (two) times daily. 09/17/15  Yes Charlynne Cousins, MD  levothyroxine (SYNTHROID, LEVOTHROID) 50 MCG tablet Take 50 mcg by mouth daily with breakfast. 10/05/17  Yes [provider]  medroxyPROGESTERone (DEPO-PROVERA) 150 MG/ML injection Inject 150  mg into the muscle every 3 (three) months.    Yes [provider]  potassium chloride SA (K-DUR,KLOR-CON) 20 MEQ tablet Take 20 mEq by mouth daily with breakfast.    Yes [provider]  risperiDONE (RISPERDAL) 2 MG tablet Take 2 mg by mouth at bedtime.   Yes [provider]  sevelamer carbonate (RENVELA) 800 MG tablet Take 800 mg by mouth See admin instructions. Take 1 tablet (800 mg) by mouth with meals and snacks   Yes [provider]  vitamin B-12  (CYANOCOBALAMIN) 500 MCG tablet Take 500 mcg by mouth daily.   Yes [provider]   Current Facility-Administered Medications  Medication Dose Route Frequency Provider Last Rate Last Dose  . acetaminophen (TYLENOL) tablet 650 mg  650 mg Oral Q4H PRN Etta Quill, DO       Or  . acetaminophen (TYLENOL) solution 650 mg  650 mg Per Tube Q4H PRN Etta Quill, DO       Or  . acetaminophen (TYLENOL) suppository 650 mg  650 mg Rectal Q4H PRN Etta Quill, DO      . albuterol (PROVENTIL) (2.5 MG/3ML) 0.083% nebulizer solution 3 mL  3 mL Inhalation Q6H PRN Etta Quill, DO      . benztropine (COGENTIN) tablet 1 mg  1 mg Oral QHS Jennette Kettle M, DO   1 mg at 10/08/17 0126  . cinacalcet (SENSIPAR) tablet 30 mg  30 mg Oral Q breakfast Jennette Kettle M, DO       And  . cinacalcet (SENSIPAR) tablet 60 mg  60 mg Oral Q supper Jennette Kettle M, DO      . cyanocobalamin tablet 500 mcg  500 mcg Oral Daily Jennette Kettle M, DO   500 mcg at 10/08/17 9622  . dialysis solution 1.5% low-MG/low-CA dianeal solution   Intraperitoneal Q6H Alric Seton, PA-C      . divalproex (DEPAKOTE) DR tablet 500 mg  500 mg Oral Q12H Jennette Kettle M, DO   500 mg at 10/08/17 2979  . heparin 1,500 Units in dialysis solution 1.5% low-MG/low-CA 3,000 mL dialysis solution   Peritoneal Dialysis PRN Alric Seton, PA-C      . heparin injection 5,000 Units  5,000 Units Subcutaneous Q8H Etta Quill, DO   5,000 Units at 10/08/17 0645  . iron polysaccharides (NIFEREX) capsule 150 mg  150 mg Oral Liz Beach M, DO   150 mg at 10/08/17 0858  . levETIRAcetam (KEPPRA) tablet 500 mg  500 mg Oral BID Jennette Kettle M, DO   500 mg at 10/08/17 8921  . levothyroxine (SYNTHROID, LEVOTHROID) tablet 50 mcg  50 mcg Oral QAC breakfast Etta Quill, DO   50 mcg at 10/08/17 0750  . multivitamin (RENA-VIT) tablet 1 tablet  1 tablet Oral Daily Etta Quill, DO   1 tablet at 10/08/17 [phone]  . potassium  chloride SA (K-DUR,KLOR-CON) CR tablet 20 mEq  20 mEq Oral Q breakfast Etta Quill, DO   20 mEq at 10/08/17 0859  . risperiDONE (RISPERDAL) tablet 2 mg  2 mg Oral QHS Jennette Kettle M, DO   2 mg at 10/07/17 2346  . sevelamer carbonate (RENVELA) tablet 800 mg  800 mg Oral TID WC Jennette Kettle M, DO   800 mg at 10/08/17 0858  . sevelamer carbonate (RENVELA) tablet 800 mg  800 mg Oral PRN Etta Quill, DO       Labs: Basic Metabolic Panel: Recent Labs  Lab 10/07/17 1815 10/07/17 1829  NA 133* 136  K 3.8 3.9  CL 93* 92*  CO2 26  --   GLUCOSE 112* 113*  BUN 29* 26*  CREATININE 11.54* 10.90*  CALCIUM 9.4  --    Liver Function Tests: Recent Labs  Lab 10/07/17 1815  AST 19  ALT 12*  ALKPHOS 110  BILITOT 0.5  PROT 7.2  ALBUMIN 3.0*   No results for input(s): LIPASE, AMYLASE in the last 168 hours. No results for input(s): AMMONIA in the last 168 hours. CBC: Recent Labs  Lab 10/07/17 1815 10/07/17 1829  WBC 7.4  --   NEUTROABS 4.9  --   HGB 8.9* 9.9*  HCT 28.4* 29.0*  MCV 104.8*  --   PLT 303  --    CBG: Recent Labs  Lab 10/07/17 2130  GLUCAP 100*   Studies/Results: Ct Abdomen Pelvis Wo Contrast  Result Date: 10/07/2017 CLINICAL DATA:  Nausea and vomiting x2 days with diarrhea. Dialysis patient. EXAM: CT ABDOMEN AND PELVIS WITHOUT CONTRAST TECHNIQUE: Multidetector CT imaging of the abdomen and pelvis was performed following the standard protocol without IV contrast. COMPARISON:  07/15/2012 abdomen ultrasound. FINDINGS: Lower chest: Top-normal size heart minimal coronary arteriosclerosis. No pericardial effusion. Respiratory motion limits assessment of the lung bases. No pneumonic consolidation, effusion or pneumothorax. Hepatobiliary: Uncomplicated cholelithiasis with 11 mm gallstone near the neck of the gallbladder by without gallbladder wall thickening or distention. The unenhanced liver is unremarkable. Pancreas: Unremarkable. No pancreatic ductal dilatation  or surrounding inflammatory changes. Spleen: Normal in size without focal abnormality. Adrenals/Urinary Tract: Normal bilateral adrenal glands. No nephrolithiasis. Hypodense lesions of both kidneys statistically consistent with cysts are noted. There are slightly exophytic hyper and isodense lesions in the upper and lower pole of the right kidney, nonspecific but may represent proteinaceous or hemorrhagic complex cysts, the upper pole lesion measuring 8 mm and the interpolar lesion measuring approximately 13 mm. A third exophytic isodense lesion in the lower pole the right kidney measuring 10 mm is also noted. Nonemergent dedicated renal CT or MRI without and with contrast for better characterization is recommended and to exclude a solid abnormality. 5 mm lower pole renal calculus with a few punctate interpolar cortical calcifications within the right kidney are identified. No hydroureteronephrosis. The urinary bladder is nondistended which may account for the mild circumferential mural thickening. Stomach/Bowel: Stomach is within normal limits. Appendix appears normal. No evidence of bowel wall thickening, distention, or inflammatory changes. Vascular/Lymphatic: Mild aortoiliac and branch vessel atherosclerosis. No lymphadenopathy. Reproductive: Uterus and bilateral adnexa are unremarkable.2 Other: Peritoneal dialysate fluid in the abdomen and pelvis with coiling of a peritoneal dialysis catheter seen in the right hemipelvis. Musculoskeletal: Osseous sclerotic appearance of the bones consistent with renal osteodystrophy. Degenerative disc disease with discogenic endplate change at L4-T6. IMPRESSION: 1. Uncomplicated cholelithiasis. 2. Renal lesions as above some are simple in appearance and others isodense to hyperdense relative to adjacent renal cortex. These may represent complex cysts possibly proteinaceous or hemorrhagic there should be further correlated with nonemergent CT or MRI, the largest is 13 mm off the  interpolar aspect of the right kidney. 3. Nonobstructing bilateral renal calculi. The largest is 5 mm in the lower pole the left kidney. 4. Moderate volume of free dialysate fluid within the abdomen and pelvis with right lower quadrant/ right hemi pelvic peritoneal dialysis catheter in place. 5. Renal osteodystrophy with degenerative disc disease L5-S1. 6. Cardiomegaly with minimal coronary arteriosclerosis. Electronically Signed   By: Ashley Royalty  M.D.   On: 10/07/2017 20:53   Ct Head Wo Contrast  Result Date: 10/07/2017 CLINICAL DATA:  Slurred speech EXAM: CT HEAD WITHOUT CONTRAST TECHNIQUE: Contiguous axial images were obtained from the base of the skull through the vertex without intravenous contrast. COMPARISON:  July 12, 2017 FINDINGS: Brain: There is mild diffuse atrophy. There is no intracranial mass, hemorrhage, extra-axial fluid collection, or midline shift. There is slight small vessel disease in the centra semiovale bilaterally. Elsewhere gray-white compartments appear normal. No evident acute infarct. Vascular: No hyperdense vessel. There are foci of atherosclerotic calcification in each carotid siphon, more on the left than on the right. Skull: The bony calvarium appears intact. Sinuses/Orbits: There is mild mucosal thickening in the medial right maxillary antrum. There is other visualized paranasal sinuses are clear. There is leftward deviation of the anterior nasal septum. Orbits appear symmetric bilaterally. Other: Mastoid air cells are clear. There is debris in each external auditory canal. IMPRESSION: Mild diffuse atrophy with mild periventricular small vessel disease. No evident acute infarct. No mass or hemorrhage. There are foci of arterial vascular calcification. There is slight right maxillary sinus disease. There is a deviated nasal septum. There is probable cerumen in each external auditory canal. Electronically Signed   By: Lowella Grip III M.D.   On: 10/07/2017 20:30     ROS: As per HPI otherwise negative.  Physical Exam: Vitals:   10/08/17 0443 10/08/17 0510 10/08/17 0614 10/08/17 0640  BP:  (!) 87/53 90/64 102/78  Pulse:  87 87   Resp:  18 18   Temp: 99.4 F (37.4 C) 99.2 F (37.3 C) 98.9 F (37.2 C)   TempSrc: Oral Axillary Axillary   SpO2:  94% 98%   Weight:      Height:         General: AAF working with PT somewhat agitated and wanting to go back to bed (sister says she is that way when she doesn't want to do something Head: NCAT sclera not icteric MMM Neck: Supple.  Lungs:  Grossly clear Breathing is unlabored. Heart: RRR Abdomen: soft NT + BS Lower extremities: no sig edema Neuro: Moves all extremities spontaneously. Dialysis Access: PD cath  Dialysis Orders:  CAPD 4 2 L exchanges per day - all 1.5%  Assessment/Plan: 1. Slurred speech -work up per primary 2. Seizure disorder - continue per meds 3. ESRD -  CAPD 2 L - 4 exchanges per day - continue home orders K 3.8 BUn 29 Cr 11; repeat labs in am- try to keep routine PD to minimize upset in routine as much as possible. 4. Hypertension/volume  - BP low continue all 1.5% PD 5. Anemia  - hgb 8.9 - on niferex - needs ESA- will give low dose Aranesp 40 - outpatient regimen unknown. 6. Metabolic bone disease -  Ca 9.4 - on sensipar/renvela 7. Nutrition - alb 3 - no need to restrict K - use heart healthy diet -on 20 KCL per day - 8. Schizoaffective disorder - home meds  9. Abdominal pain - r/o perontinitis - lesions in kidney need f/u at some point - nonemergent.  Myriam Jacobson, PA-C [place] [phone] 10/08/2017, 10:47 AM   I have personally seen and examined this patient and agree with the assessment/plan as outlined above by [facility] PA. Patient admitted foe evaluation of slurred speech and some reported abdominal pain with negative work-up so far. Plan to continue CAPD per her usual out-patient schedule and sent PD fluid for cell  count to r/o  peritonitis.  Trevonte Ashkar K.,MD 10/08/2017 1:31 PM

## 2017-10-08 NOTE — Evaluation (Signed)
Physical Therapy Evaluation Patient Details Name: Jane [name] MRN: [id number] DOB: [birth year] Today's Date: 10/08/2017   History of Present Illness  51 y.o.femalewith medical history significant ofhypertension, schizophrenia, hyperlipidemia, bipolar disorder, mental retardation, hypothyroidism, end-stage renal disease on peritoneal dialysis brought to the emergency room after she woke up this morning with the family described as slurred speech. CT showing Mild diffuse atrophy with mild periventricular small vessel disease. No evident acute infarct. No mass or hemorrhage. Awaiting MRI.  Clinical Impression  Pt functioning near baseline from mobility standpoint. Pt agitated and required max encouragement from sister to participate in therapy however was unable to have pt ambulate in hallway due to becoming agitated and crying. Pt's sister reports "she acts out when she don't get her own way" and chose to let her go back to bed this date. Pt did get up and work with OT at sink with min guard assist. Acute PT to monitor pt while in hospital but do not anticipate PT needs upon d/c.    Follow Up Recommendations No PT follow up;Supervision/Assistance - 24 hour    Equipment Recommendations  None recommended by PT    Recommendations for Other Services       Precautions / Restrictions Precautions Precautions: Fall Precaution Comments: Intellectual disability Restrictions Weight Bearing Restrictions: No      Mobility  Bed Mobility Overal bed mobility: Needs Assistance Bed Mobility: Supine to Sit;Sit to Supine     Supine to sit: Supervision Sit to supine: Supervision   General bed mobility comments: pt requiring max encouragement to participate but able to complete without physical assist  Transfers Overall transfer level: Needs assistance Equipment used: None Transfers: Sit to/from Stand Sit to Stand: Min guard         General transfer comment: min guard for safety due  to mild impulsivity  Ambulation/Gait Ambulation/Gait assistance: Min guard Ambulation Distance (Feet): 5 Feet(x2 (to/from sink)) Assistive device: None Gait Pattern/deviations: WFL(Within Functional Limits) Gait velocity: mildly decreased   General Gait Details: pt adamantly refused to amb in hallway stating "i want to go back to bed" crying and screaming. pt's sister states "she'll show out if she's not getting her way" Pt's sister opted to allow her to go back to bed this date  Stairs            Wheelchair Mobility    Modified Rankin (Stroke Patients Only) Modified Rankin (Stroke Patients Only) Pre-Morbid Rankin Score: Slight disability Modified Rankin: Slight disability     Balance Overall balance assessment: Needs assistance Sitting-balance support: No upper extremity supported;Feet supported Sitting balance-Leahy Scale: Fair     Standing balance support: No upper extremity supported;During functional activity Standing balance-Leahy Scale: Fair Standing balance comment: pt able to stand at sink and perform ADLs with OT without difficulty                             Pertinent Vitals/Pain Pain Assessment: Faces Faces Pain Scale: No hurt Pain Intervention(s): Monitored during session    Home Living Family/patient expects to be discharged to:: Private residence Living Arrangements: Parent;Other relatives(Sister) Available Help at Discharge: Family;Available 24 hours/day Type of Home: House Home Access: Stairs to enter Entrance Stairs-Rails: Psychiatric nurse of Steps: 4 Home Layout: Two level;Able to live on main level with bedroom/bathroom Home Equipment: None      Prior Function Level of Independence: Independent         Comments: Independent with BADLs.  Sister does IADLs including cooking and driving. Goes to a day program Monday-Friday. Rides [employer] bus to day program     Hand Dominance   Dominant Hand: Right     Extremity/Trunk Assessment   Upper Extremity Assessment Upper Extremity Assessment: Defer to OT evaluation    Lower Extremity Assessment Lower Extremity Assessment: Overall WFL for tasks assessed    Cervical / Trunk Assessment Cervical / Trunk Assessment: Normal  Communication   Communication: Expressive difficulties;Other (comment)(Intellectual disability)  Cognition Arousal/Alertness: Awake/alert Behavior During Therapy: Flat affect Overall Cognitive Status: History of cognitive impairments - at baseline                                 General Comments: Pt with history of  intellectual disability, bipolar, and schezophrenia. Pt following simple commands and benefits from increased time and simple cues. Upon initiating mobility in hall way, pt became upset and started to cry. Sister confirms this is baseline behavior when pt does not want to perform a certain activity.       General Comments General comments (skin integrity, edema, etc.): Sister present throughout session    Exercises     Assessment/Plan    PT Assessment Patient needs continued PT services  PT Problem List Decreased activity tolerance;Decreased balance;Decreased mobility;Decreased safety awareness;Decreased cognition;Decreased knowledge of use of DME       PT Treatment Interventions DME instruction;Gait training;Stair training;Functional mobility training;Therapeutic activities;Therapeutic exercise;Balance training    PT Goals (Current goals can be found in the Care Plan section)  Acute Rehab PT Goals Patient Stated Goal: Unstated PT Goal Formulation: With patient/family Time For Goal Achievement: 10/22/17 Potential to Achieve Goals: Good Additional Goals Additional Goal #1: Pt to score >19 on DGI to indicate minimal falls risk.    Frequency Min 2X/week   Barriers to discharge        Co-evaluation PT/OT/SLP Co-Evaluation/Treatment: Yes Reason for Co-Treatment: Necessary to address  cognition/behavior during functional activity PT goals addressed during session: Mobility/safety with mobility         AM-PAC PT "6 Clicks" Daily Activity  Outcome Measure Difficulty turning over in bed (including adjusting bedclothes, sheets and blankets)?: None Difficulty moving from lying on back to sitting on the side of the bed? : None Difficulty sitting down on and standing up from a chair with arms (e.g., wheelchair, bedside commode, etc,.)?: A Little Help needed moving to and from a bed to chair (including a wheelchair)?: A Little Help needed walking in hospital room?: A Little Help needed climbing 3-5 steps with a railing? : A Little 6 Click Score: 20    End of Session Equipment Utilized During Treatment: Gait belt Activity Tolerance: Patient tolerated treatment well;Treatment limited secondary to agitation Patient left: in bed;with call bell/phone within reach;with bed alarm set;with family/visitor present Nurse Communication: Mobility status PT Visit Diagnosis: Unsteadiness on feet (R26.81)    Time: 0109-3235 PT Time Calculation (min) (ACUTE ONLY): 17 min   Charges:   PT Evaluation $PT Eval Moderate Complexity: 1 Mod     PT G Codes:   PT G-Codes **NOT FOR INPATIENT CLASS** Functional Assessment Tool Used: Clinical judgement Functional Limitation: Mobility: Walking and moving around Mobility: Walking and Moving Around Current Status (T7322): At least 1 percent but less than 20 percent impaired, limited or restricted Mobility: Walking and Moving Around Goal Status [phone]): At least 1 percent but less than 20 percent impaired, limited or restricted  Kittie Plater, PT, DPT Pager #: [phone] Office #: [phone]   Berline Lopes 10/08/2017, 9:59 AM

## 2017-10-08 NOTE — Progress Notes (Signed)
PROGRESS NOTE    Jane [name]  ZYS:[id number] DOB: May 27, 1966 DOA: 10/07/2017 PCP: [name], MD   Outpatient Specialists:    Brief Narrative:  Jane [name] is a 51 y.o. female with medical history significant of hypertension, schizophrenia, hyperlipidemia, bipolar disorder, mental retardation, hypothyroidism, end-stage renal disease on peritoneal dialysis brought to the emergency room after she woke up this morning with the family described as slurred speech. According to them, she usually is able to carry out normal conversations but when she woke up this morning, her speech sounded slurred. She also seem to be having difficulty finding the right words. She has not had similar symptoms in the past. Her last seizure was over a year ago. She is compliant with her medications-Depakote 500 twice daily and Keppra 500 twice daily.      Assessment & Plan:   Principal Problem:   Slurred speech Active Problems:   Schizoaffective disorder (HCC)   RETARDATION, MENTAL NOS   End-stage renal disease on peritoneal dialysis (HCC)   Seizure disorder (HCC)   Slurred speech - -suspect underlying breakthrough seizure - MRI brain: negative for CVA - EEG in AM - need PD fluid and send to lab for cell count etc to r/o peritonitis.  Not empirically treating this at the moment though since patient has 0 SIRS at the moment and ABD pain complaints do seem to be somewhat chronic intermittently-- per sister--- never had before   Seizure disorder - Continue Keppra 500 bid and Depakote 500 BID Depakote Level Single dose of fosphenytoin Seizure precautions  ESRD on PD - -appreciate Dr. Serita Grit consult  Schizoaffective disorder - Continue home meds      DVT prophylaxis:  SQ Heparin  Code Status: Full Code   Family Communication: Sister at bedside  Disposition Plan:     Consultants:   Renal  nephro   Subjective: Not speaking Sister is care giver and  reports slurred speech  Objective: Vitals:   10/08/17 0510 10/08/17 0614 10/08/17 0640 10/08/17 1335  BP: (!) 87/53 90/64 102/78 110/64  Pulse: 87 87  97  Resp: 18 18  18   Temp: 99.2 F (37.3 C) 98.9 F (37.2 C)  98.7 F (37.1 C)  TempSrc: Axillary Axillary  Oral  SpO2: 94% 98%  97%  Weight:    77 kg (169 lb 12.1 oz)  Height:        Intake/Output Summary (Last 24 hours) at 10/08/2017 1643 Last data filed at 10/08/2017 0840 Gross per 24 hour  Intake 290 ml  Output -  Net 290 ml   Filed Weights   10/07/17 2239 10/08/17 1335  Weight: 79.8 kg (176 lb) 77 kg (169 lb 12.1 oz)    Examination:  General exam: resting in bed, NAD Respiratory system: Clear to auscultation. Respiratory effort normal. Cardiovascular system: S1 & S2 heard, RRR. No JVD, murmurs, rubs, gallops or clicks. No pedal edema. Gastrointestinal system: Abdomen is nondistended, soft and nontender. No organomegaly or masses felt. Normal bowel sounds heard. Central nervous system: resting, uncooperative Extremities: unable to asses     Data Reviewed: I have personally reviewed following labs and imaging studies  CBC: Recent Labs  Lab 10/07/17 1815 10/07/17 1829  WBC 7.4  --   NEUTROABS 4.9  --   HGB 8.9* 9.9*  HCT 28.4* 29.0*  MCV 104.8*  --   PLT 303  --    Basic Metabolic Panel: Recent Labs  Lab 10/07/17 1815 10/07/17 1829  NA 133* 136  K 3.8 3.9  CL 93* 92*  CO2 26  --   GLUCOSE 112* 113*  BUN 29* 26*  CREATININE 11.54* 10.90*  CALCIUM 9.4  --    GFR: Estimated Creatinine Clearance: 6.3 mL/min (A) (by C-G formula based on SCr of 10.9 mg/dL (H)). Liver Function Tests: Recent Labs  Lab 10/07/17 1815  AST 19  ALT 12*  ALKPHOS 110  BILITOT 0.5  PROT 7.2  ALBUMIN 3.0*   No results for input(s): LIPASE, AMYLASE in the last 168 hours. No results for input(s): AMMONIA in the last 168 hours. Coagulation Profile: Recent Labs  Lab 10/07/17 1815  INR 1.10   Cardiac Enzymes: No  results for input(s): CKTOTAL, CKMB, CKMBINDEX, TROPONINI in the last 168 hours. BNP (last 3 results) No results for input(s): PROBNP in the last 8760 hours. HbA1C: No results for input(s): HGBA1C in the last 72 hours. CBG: Recent Labs  Lab 10/07/17 2130  GLUCAP 100*   Lipid Profile: Recent Labs    10/08/17 0029  CHOL 149  HDL 36*  LDLCALC 73  TRIG 201*  CHOLHDL 4.1   Thyroid Function Tests: No results for input(s): TSH, T4TOTAL, FREET4, T3FREE, THYROIDAB in the last 72 hours. Anemia Panel: No results for input(s): VITAMINB12, FOLATE, FERRITIN, TIBC, IRON, RETICCTPCT in the last 72 hours. Urine analysis:    Component Value Date/Time   COLORURINE YELLOW 10/07/2017 2158   APPEARANCEUR HAZY (A) 10/07/2017 2158   LABSPEC 1.005 10/07/2017 2158   PHURINE 8.0 10/07/2017 2158   GLUCOSEU NEGATIVE 10/07/2017 2158   HGBUR NEGATIVE 10/07/2017 2158   HGBUR trace-intact 04/12/2010 0904   BILIRUBINUR NEGATIVE 10/07/2017 2158   KETONESUR NEGATIVE 10/07/2017 2158   PROTEINUR NEGATIVE 10/07/2017 2158   UROBILINOGEN 1.0 07/14/2012 2005   NITRITE NEGATIVE 10/07/2017 2158   LEUKOCYTESUR TRACE (A) 10/07/2017 2158     )No results found for this or any previous visit (from the past 240 hour(s)).    Anti-infectives (From admission, onward)   None       Radiology Studies: Ct Abdomen Pelvis Wo Contrast  Result Date: 10/07/2017 CLINICAL DATA:  Nausea and vomiting x2 days with diarrhea. Dialysis patient. EXAM: CT ABDOMEN AND PELVIS WITHOUT CONTRAST TECHNIQUE: Multidetector CT imaging of the abdomen and pelvis was performed following the standard protocol without IV contrast. COMPARISON:  07/15/2012 abdomen ultrasound. FINDINGS: Lower chest: Top-normal size heart minimal coronary arteriosclerosis. No pericardial effusion. Respiratory motion limits assessment of the lung bases. No pneumonic consolidation, effusion or pneumothorax. Hepatobiliary: Uncomplicated cholelithiasis with 11 mm  gallstone near the neck of the gallbladder by without gallbladder wall thickening or distention. The unenhanced liver is unremarkable. Pancreas: Unremarkable. No pancreatic ductal dilatation or surrounding inflammatory changes. Spleen: Normal in size without focal abnormality. Adrenals/Urinary Tract: Normal bilateral adrenal glands. No nephrolithiasis. Hypodense lesions of both kidneys statistically consistent with cysts are noted. There are slightly exophytic hyper and isodense lesions in the upper and lower pole of the right kidney, nonspecific but may represent proteinaceous or hemorrhagic complex cysts, the upper pole lesion measuring 8 mm and the interpolar lesion measuring approximately 13 mm. A third exophytic isodense lesion in the lower pole the right kidney measuring 10 mm is also noted. Nonemergent dedicated renal CT or MRI without and with contrast for better characterization is recommended and to exclude a solid abnormality. 5 mm lower pole renal calculus with a few punctate interpolar cortical calcifications within the right kidney are identified. No hydroureteronephrosis. The urinary bladder is nondistended which may account for  the mild circumferential mural thickening. Stomach/Bowel: Stomach is within normal limits. Appendix appears normal. No evidence of bowel wall thickening, distention, or inflammatory changes. Vascular/Lymphatic: Mild aortoiliac and branch vessel atherosclerosis. No lymphadenopathy. Reproductive: Uterus and bilateral adnexa are unremarkable.2 Other: Peritoneal dialysate fluid in the abdomen and pelvis with coiling of a peritoneal dialysis catheter seen in the right hemipelvis. Musculoskeletal: Osseous sclerotic appearance of the bones consistent with renal osteodystrophy. Degenerative disc disease with discogenic endplate change at Y5-K3. IMPRESSION: 1. Uncomplicated cholelithiasis. 2. Renal lesions as above some are simple in appearance and others isodense to hyperdense  relative to adjacent renal cortex. These may represent complex cysts possibly proteinaceous or hemorrhagic there should be further correlated with nonemergent CT or MRI, the largest is 13 mm off the interpolar aspect of the right kidney. 3. Nonobstructing bilateral renal calculi. The largest is 5 mm in the lower pole the left kidney. 4. Moderate volume of free dialysate fluid within the abdomen and pelvis with right lower quadrant/ right hemi pelvic peritoneal dialysis catheter in place. 5. Renal osteodystrophy with degenerative disc disease L5-S1. 6. Cardiomegaly with minimal coronary arteriosclerosis. Electronically Signed   By: Ashley Royalty M.D.   On: 10/07/2017 20:53   Ct Head Wo Contrast  Result Date: 10/07/2017 CLINICAL DATA:  Slurred speech EXAM: CT HEAD WITHOUT CONTRAST TECHNIQUE: Contiguous axial images were obtained from the base of the skull through the vertex without intravenous contrast. COMPARISON:  July 12, 2017 FINDINGS: Brain: There is mild diffuse atrophy. There is no intracranial mass, hemorrhage, extra-axial fluid collection, or midline shift. There is slight small vessel disease in the centra semiovale bilaterally. Elsewhere gray-white compartments appear normal. No evident acute infarct. Vascular: No hyperdense vessel. There are foci of atherosclerotic calcification in each carotid siphon, more on the left than on the right. Skull: The bony calvarium appears intact. Sinuses/Orbits: There is mild mucosal thickening in the medial right maxillary antrum. There is other visualized paranasal sinuses are clear. There is leftward deviation of the anterior nasal septum. Orbits appear symmetric bilaterally. Other: Mastoid air cells are clear. There is debris in each external auditory canal. IMPRESSION: Mild diffuse atrophy with mild periventricular small vessel disease. No evident acute infarct. No mass or hemorrhage. There are foci of arterial vascular calcification. There is slight right  maxillary sinus disease. There is a deviated nasal septum. There is probable cerumen in each external auditory canal. Electronically Signed   By: Lowella Grip III M.D.   On: 10/07/2017 20:30   [name]  Result Date: 10/08/2017 CLINICAL DATA:  51 y.o. female with medical history significant of hypertension, schizophrenia, hyperlipidemia, bipolar disorder, mental retardation, hypothyroidism, end-stage renal disease on peritoneal dialysis brought to the emergency room after she woke up this morning with the family described as slurred speech. EXAM: MRI HEAD WITHOUT CONTRAST TECHNIQUE: Multiplanar, multiecho pulse sequences of the brain and surrounding structures were obtained without intravenous contrast. COMPARISON:  CT head 10/07/2017. FINDINGS: The patient was unable to remain motionless for the exam. Small or subtle lesions could be overlooked. In addition, some series could not be completed. Brain: No evidence for acute infarction, hemorrhage, mass lesion, hydrocephalus, or extra-axial fluid. Advanced atrophy. Minor white matter disease. Vascular: Normal flow voids. Skull and upper cervical spine: Normal marrow signal. Expanded empty sella bold Sinuses/Orbits: Negative. Other: None. IMPRESSION: Atrophy. Minor white matter disease. No acute intracranial findings. No cause is seen for the reported symptoms. Electronically Signed   By: Roderic Ovens.D.  On: 10/08/2017 11:23        Scheduled Meds: . benztropine  1 mg Oral QHS  . cinacalcet  30 mg Oral Q breakfast   And  . cinacalcet  60 mg Oral Q supper  . cyanocobalamin  500 mcg Oral Daily  . divalproex  500 mg Oral Q12H  . heparin  5,000 Units Subcutaneous Q8H  . iron polysaccharides  150 mg Oral QODAY  . levETIRAcetam  500 mg Oral BID  . levothyroxine  50 mcg Oral QAC breakfast  . multivitamin  1 tablet Oral Daily  . potassium chloride SA  20 mEq Oral Q breakfast  . risperiDONE  2 mg Oral QHS  . sevelamer carbonate  800  mg Oral TID WC   Continuous Infusions: . dialysis solution 1.5% low-MG/low-CA       LOS: 0 days    Time spent: 35 min    Geradine Girt, DO [facility] Pager [phone]  If 7PM-7AM, please contact night-coverage [email or web address] Password [email or web address] 10/08/2017, 4:43 PM

## 2017-10-09 ENCOUNTER — Inpatient Hospital Stay (HOSPITAL_COMMUNITY): Payer: Medicare Other

## 2017-10-09 DIAGNOSIS — F79 Unspecified intellectual disabilities: Secondary | ICD-10-CM

## 2017-10-09 DIAGNOSIS — D631 Anemia in chronic kidney disease: Secondary | ICD-10-CM | POA: Diagnosis not present

## 2017-10-09 DIAGNOSIS — F259 Schizoaffective disorder, unspecified: Secondary | ICD-10-CM

## 2017-10-09 DIAGNOSIS — G40909 Epilepsy, unspecified, not intractable, without status epilepticus: Principal | ICD-10-CM

## 2017-10-09 DIAGNOSIS — N186 End stage renal disease: Secondary | ICD-10-CM | POA: Diagnosis not present

## 2017-10-09 DIAGNOSIS — N2581 Secondary hyperparathyroidism of renal origin: Secondary | ICD-10-CM | POA: Diagnosis not present

## 2017-10-09 LAB — RENAL FUNCTION PANEL
Albumin: 2.3 g/dL — ABNORMAL LOW (ref 3.5–5.0)
Anion gap: 13 (ref 5–15)
BUN: 35 mg/dL — ABNORMAL HIGH (ref 6–20)
CO2: 23 mmol/L (ref 22–32)
Calcium: 8.6 mg/dL — ABNORMAL LOW (ref 8.9–10.3)
Chloride: 95 mmol/L — ABNORMAL LOW (ref 101–111)
Creatinine, Ser: 12.67 mg/dL — ABNORMAL HIGH (ref 0.44–1.00)
GFR calc Af Amer: 3 mL/min — ABNORMAL LOW (ref >60)
GFR calc non Af Amer: 3 mL/min — ABNORMAL LOW (ref >60)
Glucose, Bld: 70 mg/dL (ref 65–99)
Phosphorus: 5.4 mg/dL — ABNORMAL HIGH (ref 2.5–4.6)
Potassium: 4.9 mmol/L (ref 3.5–5.1)
Sodium: 131 mmol/L — ABNORMAL LOW (ref 135–145)

## 2017-10-09 LAB — CBC
HCT: 23.3 % — ABNORMAL LOW (ref 36.0–46.0)
Hemoglobin: 7.6 g/dL — ABNORMAL LOW (ref 12.0–15.0)
MCH: 33 pg (ref 26.0–34.0)
MCHC: 32.6 g/dL (ref 30.0–36.0)
MCV: 101.3 fL — ABNORMAL HIGH (ref 78.0–100.0)
Platelets: 252 10*3/uL (ref 150–400)
RBC: 2.3 MIL/uL — ABNORMAL LOW (ref 3.87–5.11)
RDW: 15.4 % (ref 11.5–15.5)
WBC: 6 10*3/uL (ref 4.0–10.5)

## 2017-10-09 LAB — HEMOGLOBIN A1C
HEMOGLOBIN A1C: 4.7 % — AB (ref 4.8–5.6)
Mean Plasma Glucose: 88 mg/dL

## 2017-10-09 NOTE — Discharge Summary (Signed)
Physician Discharge Summary  Jane [name] ZOX:[id number] DOB: 01/20/1966 DOA: 10/07/2017  PCP: Jane Mccreedy, MD  Admit date: 10/07/2017 Discharge date: 10/09/2017   Recommendations for Outpatient Follow-Up:   Continue PD outpatient neuro follow up  Discharge Diagnosis:   Principal Problem:   Slurred speech Active Problems:   Schizoaffective disorder ([place])   RETARDATION, MENTAL NOS   End-stage renal disease on peritoneal dialysis [facility])   Seizure disorder [facility])   Discharge disposition:  Home.  Discharge Condition: Improved.  Diet recommendation: renal diet  Wound care: None.   History of Present Illness:   Jane [name] is a 51 y.o. female with medical history significant of hypertension, schizophrenia, hyperlipidemia, bipolar disorder, mental retardation, hypothyroidism, end-stage renal disease on peritoneal dialysis brought to the emergency room after she woke up this morning with the family described as slurred speech. According to them, she usually is able to carry out normal conversations but when she woke up this morning, her speech sounded slurred. She also seem to be having difficulty finding the right words. She has not had similar symptoms in the past. Her last seizure was over a year ago. She is compliant with her medications-Depakote 500 twice daily and Keppra 500 twice daily.  Also complaints of abd pain, intermittent episodes of vomiting over past few days.  Apparently she intermittently has abd pain from time to time.  No fevers no chills.  PD fluid is clear.     Hospital Course by Problem:   Slurred speech - -suspect underlying breakthrough seizure - MRI brain: negative for CVA - EEG without seizures  Seizure disorder - Continue Keppra 500 bid and Depakote 500 BID Single dose of fosphenytoin Seizure precautions Outpatient follow up  ESRD on PD - -appreciate Dr. Serita Grit consult  Schizoaffective disorder - Continue  home meds      Medical Consultants:    Neuro  renal   Discharge Exam:   Vitals:   10/09/17 0630 10/09/17 0936  BP: 109/69 109/67  Pulse: 98 93  Resp: 18 19  Temp: 97.6 F (36.4 C) 98.6 F (37 C)  SpO2: 98% 94%   Vitals:   10/09/17 0100 10/09/17 0430 10/09/17 0630 10/09/17 0936  BP: 111/78 (!) 113/58 109/69 109/67  Pulse: 95 86 98 93  Resp: 18 20 18 19   Temp: 98.6 F (37 C) 97.8 F (36.6 C) 97.6 F (36.4 C) 98.6 F (37 C)  TempSrc: Oral Axillary Oral Oral  SpO2: 98% 98% 98% 94%  Weight: 76.9 kg (169 lb 8.5 oz)  75.5 kg (166 lb 7.2 oz)   Height:        Gen:  NAD- per sister at bedside, patient is back to baseline   The results of significant diagnostics from this hospitalization (including imaging, microbiology, ancillary and laboratory) are listed below for reference.     Procedures and Diagnostic Studies:   Ct Abdomen Pelvis Wo Contrast  Result Date: 10/07/2017 CLINICAL DATA:  Nausea and vomiting x2 days with diarrhea. Dialysis patient. EXAM: CT ABDOMEN AND PELVIS WITHOUT CONTRAST TECHNIQUE: Multidetector CT imaging of the abdomen and pelvis was performed following the standard protocol without IV contrast. COMPARISON:  07/15/2012 abdomen ultrasound. FINDINGS: Lower chest: Top-normal size heart minimal coronary arteriosclerosis. No pericardial effusion. Respiratory motion limits assessment of the lung bases. No pneumonic consolidation, effusion or pneumothorax. Hepatobiliary: Uncomplicated cholelithiasis with 11 mm gallstone near the neck of the gallbladder by without gallbladder wall thickening or distention. The unenhanced liver is unremarkable. Pancreas: Unremarkable. No  pancreatic ductal dilatation or surrounding inflammatory changes. Spleen: Normal in size without focal abnormality. Adrenals/Urinary Tract: Normal bilateral adrenal glands. No nephrolithiasis. Hypodense lesions of both kidneys statistically consistent with cysts are noted. There are slightly  exophytic hyper and isodense lesions in the upper and lower pole of the right kidney, nonspecific but may represent proteinaceous or hemorrhagic complex cysts, the upper pole lesion measuring 8 mm and the interpolar lesion measuring approximately 13 mm. A third exophytic isodense lesion in the lower pole the right kidney measuring 10 mm is also noted. Nonemergent dedicated renal CT or MRI without and with contrast for better characterization is recommended and to exclude a solid abnormality. 5 mm lower pole renal calculus with a few punctate interpolar cortical calcifications within the right kidney are identified. No hydroureteronephrosis. The urinary bladder is nondistended which may account for the mild circumferential mural thickening. Stomach/Bowel: Stomach is within normal limits. Appendix appears normal. No evidence of bowel wall thickening, distention, or inflammatory changes. Vascular/Lymphatic: Mild aortoiliac and branch vessel atherosclerosis. No lymphadenopathy. Reproductive: Uterus and bilateral adnexa are unremarkable.2 Other: Peritoneal dialysate fluid in the abdomen and pelvis with coiling of a peritoneal dialysis catheter seen in the right hemipelvis. Musculoskeletal: Osseous sclerotic appearance of the bones consistent with renal osteodystrophy. Degenerative disc disease with discogenic endplate change at Z6-X0. IMPRESSION: 1. Uncomplicated cholelithiasis. 2. Renal lesions as above some are simple in appearance and others isodense to hyperdense relative to adjacent renal cortex. These may represent complex cysts possibly proteinaceous or hemorrhagic there should be further correlated with nonemergent CT or MRI, the largest is 13 mm off the interpolar aspect of the right kidney. 3. Nonobstructing bilateral renal calculi. The largest is 5 mm in the lower pole the left kidney. 4. Moderate volume of free dialysate fluid within the abdomen and pelvis with right lower quadrant/ right hemi pelvic  peritoneal dialysis catheter in place. 5. Renal osteodystrophy with degenerative disc disease L5-S1. 6. Cardiomegaly with minimal coronary arteriosclerosis. Electronically Signed   By: Ashley Royalty M.D.   On: 10/07/2017 20:53   Ct Head Wo Contrast  Result Date: 10/07/2017 CLINICAL DATA:  Slurred speech EXAM: CT HEAD WITHOUT CONTRAST TECHNIQUE: Contiguous axial images were obtained from the base of the skull through the vertex without intravenous contrast. COMPARISON:  July 12, 2017 FINDINGS: Brain: There is mild diffuse atrophy. There is no intracranial mass, hemorrhage, extra-axial fluid collection, or midline shift. There is slight small vessel disease in the centra semiovale bilaterally. Elsewhere gray-white compartments appear normal. No evident acute infarct. Vascular: No hyperdense vessel. There are foci of atherosclerotic calcification in each carotid siphon, more on the left than on the right. Skull: The bony calvarium appears intact. Sinuses/Orbits: There is mild mucosal thickening in the medial right maxillary antrum. There is other visualized paranasal sinuses are clear. There is leftward deviation of the anterior nasal septum. Orbits appear symmetric bilaterally. Other: Mastoid air cells are clear. There is debris in each external auditory canal. IMPRESSION: Mild diffuse atrophy with mild periventricular small vessel disease. No evident acute infarct. No mass or hemorrhage. There are foci of arterial vascular calcification. There is slight right maxillary sinus disease. There is a deviated nasal septum. There is probable cerumen in each external auditory canal. Electronically Signed   By: Lowella Grip III M.D.   On: 10/07/2017 20:30   [name]  Result Date: 10/08/2017 CLINICAL DATA:  51 y.o. female with medical history significant of hypertension, schizophrenia, hyperlipidemia, bipolar disorder, mental retardation, hypothyroidism,  end-stage renal disease on peritoneal  dialysis brought to the emergency room after she woke up this morning with the family described as slurred speech. EXAM: MRI HEAD WITHOUT CONTRAST TECHNIQUE: Multiplanar, multiecho pulse sequences of the brain and surrounding structures were obtained without intravenous contrast. COMPARISON:  CT head 10/07/2017. FINDINGS: The patient was unable to remain motionless for the exam. Small or subtle lesions could be overlooked. In addition, some series could not be completed. Brain: No evidence for acute infarction, hemorrhage, mass lesion, hydrocephalus, or extra-axial fluid. Advanced atrophy. Minor white matter disease. Vascular: Normal flow voids. Skull and upper cervical spine: Normal marrow signal. Expanded empty sella bold Sinuses/Orbits: Negative. Other: None. IMPRESSION: Atrophy. Minor white matter disease. No acute intracranial findings. No cause is seen for the reported symptoms. Electronically Signed   By: Staci Righter M.D.   On: 10/08/2017 11:23     Labs:   Basic Metabolic Panel: Recent Labs  Lab 10/07/17 1815 10/07/17 1829 10/09/17 0621  NA 133* 136 131*  K 3.8 3.9 4.9  CL 93* 92* 95*  CO2 26  --  23  GLUCOSE 112* 113* 70  BUN 29* 26* 35*  CREATININE 11.54* 10.90* 12.67*  CALCIUM 9.4  --  8.6*  PHOS  --   --  5.4*   GFR Estimated Creatinine Clearance: 5.4 mL/min (A) (by C-G formula based on SCr of 12.67 mg/dL (H)). Liver Function Tests: Recent Labs  Lab 10/07/17 1815 10/09/17 0621  AST 19  --   ALT 12*  --   ALKPHOS 110  --   BILITOT 0.5  --   PROT 7.2  --   ALBUMIN 3.0* 2.3*   No results for input(s): LIPASE, AMYLASE in the last 168 hours. No results for input(s): AMMONIA in the last 168 hours. Coagulation profile Recent Labs  Lab 10/07/17 1815  INR 1.10    CBC: Recent Labs  Lab 10/07/17 1815 10/07/17 1829 10/09/17 0621  WBC 7.4  --  6.0  NEUTROABS 4.9  --   --   HGB 8.9* 9.9* 7.6*  HCT 28.4* 29.0* 23.3*  MCV 104.8*  --  101.3*  PLT 303  --  252    Cardiac Enzymes: No results for input(s): CKTOTAL, CKMB, CKMBINDEX, TROPONINI in the last 168 hours. BNP: Invalid input(s): POCBNP CBG: Recent Labs  Lab 10/07/17 2130  GLUCAP 100*   D-Dimer No results for input(s): DDIMER in the last 72 hours. Hgb A1c Recent Labs    10/08/17 0029  HGBA1C 4.7*   Lipid Profile Recent Labs    10/08/17 0029  CHOL 149  HDL 36*  LDLCALC 73  TRIG 201*  CHOLHDL 4.1   Thyroid function studies No results for input(s): TSH, T4TOTAL, T3FREE, THYROIDAB in the last 72 hours.  Invalid input(s): FREET3 Anemia work up No results for input(s): VITAMINB12, FOLATE, FERRITIN, TIBC, IRON, RETICCTPCT in the last 72 hours. Microbiology Recent Results (from the past 240 hour(s))  Gram stain     Status: None   Collection Time: 10/08/17  4:00 PM  Result Value Ref Range Status   Specimen Description PERITONEAL DIALYSATE  Final   Special Requests BOTTLES DRAWN AEROBIC AND ANAEROBIC  Final   Gram Stain   Final    CYTOSPIN SMEAR WBC PRESENT, PREDOMINANTLY MONONUCLEAR NO ORGANISMS SEEN    Report Status 10/08/2017 FINAL  Final     Discharge Instructions:   Discharge Instructions    Discharge instructions   Complete by:  As directed    Renal  diet Continue PD   Increase activity slowly   Complete by:  As directed      Allergies as of 10/09/2017   No Known Allergies     Medication List    TAKE these medications   albuterol 108 (90 Base) MCG/ACT inhaler Commonly known as:  PROVENTIL HFA;VENTOLIN HFA Inhale 2 puffs into the lungs every 6 (six) hours as needed for wheezing or shortness of breath.   benztropine 1 MG tablet Commonly known as:  COGENTIN Take 1 mg by mouth at bedtime.   cinacalcet 30 MG tablet Commonly known as:  SENSIPAR Take 30-60 mg by mouth See admin instructions. Take 1 tablet (30 mg) by mouth with breakfast and 2 tablets (60 mg) with supper   DIALYVITE 800 0.8 MG Tabs Take 0.8 mg by mouth daily.   divalproex 500 MG  DR tablet Commonly known as:  DEPAKOTE Take 500 mg by mouth 2 (two) times daily.   iron polysaccharides 150 MG capsule Commonly known as:  NIFEREX Take 150 mg by mouth every other day.   levETIRAcetam 500 MG tablet Commonly known as:  KEPPRA Take 1 tablet (500 mg total) by mouth 2 (two) times daily.   levothyroxine 50 MCG tablet Commonly known as:  SYNTHROID, LEVOTHROID Take 50 mcg by mouth daily with breakfast.   medroxyPROGESTERone 150 MG/ML injection Commonly known as:  DEPO-PROVERA Inject 150 mg into the muscle every 3 (three) months.   potassium chloride SA 20 MEQ tablet Commonly known as:  K-DUR,KLOR-CON Take 20 mEq by mouth daily with breakfast.   risperiDONE 2 MG tablet Commonly known as:  RISPERDAL Take 2 mg by mouth at bedtime.   sevelamer carbonate 800 MG tablet Commonly known as:  RENVELA Take 800 mg by mouth See admin instructions. Take 1 tablet (800 mg) by mouth with meals and snacks   vitamin B-12 500 MCG tablet Commonly known as:  CYANOCOBALAMIN Take 500 mcg by mouth daily.      Follow-up Information    [name], [name], MD Follow up in 1 week(s).   Specialty:  Internal Medicine Contact information: [address] [place] [address] [phone]        neurology 2-3 weeks Follow up.            Time coordinating discharge: 35 min  Signed:  Geradine Girt   [facility] 10/09/2017, 1:31 PM

## 2017-10-09 NOTE — Care Management Note (Signed)
Case Management Note  Patient Details  Name: Jane [name] MRN: [id number] Date of Birth: 1966-05-24  Subjective/Objective:     Pt in with slurred speech. She is from home with her family.                Action/Plan: No f/u per PT/OT. Sister at bedside and to provide transportation home.  Pt has PCP and insurance. No further needs per CM.  Expected Discharge Date:  10/09/17               Expected Discharge Plan:  Home/Self Care  In-House Referral:     Discharge planning Services     Post Acute Care Choice:    Choice offered to:     DME Arranged:    DME Agency:     HH Arranged:    HH Agency:     Status of Service:  Completed, signed off  If discussed at [employer] of Stay Meetings, dates discussed:    Additional Comments:  Pollie Friar, RN 10/09/2017, 2:41 PM

## 2017-10-09 NOTE — Progress Notes (Signed)
EEG completed; results pending.    

## 2017-10-09 NOTE — Progress Notes (Signed)
Subjective: Currently resting in bed and expresses that she would like to be left alone as she is tired.  After some encouragement patient follows commands. Patient is not showing any seizure-like activity at this time.  Awaiting final EEG results.  Exam: Vitals:   10/09/17 0630 10/09/17 0936  BP: 109/69 109/67  Pulse: 98 93  Resp: 18 19  Temp: 97.6 F (36.4 C) 98.6 F (37 C)  SpO2: 98% 94%   HEENT-  Normocephalic, no lesions, without obvious abnormality.  Normal external eye and conjunctiva.  Normal TM's bilaterally.  Normal auditory canals and external ears. Normal external nose, mucus membranes and septum.  Normal pharynx. Cardiovascular- S1, S2 normal, pulses palpable throughout   Lungs- chest clear, no wheezing, rales, normal symmetric air entry Abdomen- normal findings: bowel sounds normal Extremities- no edema  Neuro:  CN: Pupils are equal and round. They are symmetrically reactive from 3-->2 mm. EOMI without nystagmus. Facial sensation is intact to light touch. Face is symmetric at rest with normal strength and mobility. Hearing is intact to conversation. Palate elevates symmetrically and uvula is midline. Voice is normal in tone, pitch and quality. Bilateral SCM and trapezii are 5/5. Tongue is midline with normal bulk and mobility.  Motor: Normal bulk, tone, and strength. 5/5 throughout. No drift.  Sensation: Intact to light touch.  DTRs: 2+, symmetric  Toes downgoing bilaterally. No pathologic reflexes.  Coordination: Finger-to-nose and heel-to-shin are without dysmetria   Medications:  Scheduled: . benztropine  1 mg Oral QHS  . cinacalcet  30 mg Oral Q breakfast   And  . cinacalcet  60 mg Oral Q supper  . cyanocobalamin  500 mcg Oral Daily  . divalproex  500 mg Oral Q12H  . heparin  5,000 Units Subcutaneous Q8H  . iron polysaccharides  150 mg Oral QODAY  . levETIRAcetam  500 mg Oral BID  . levothyroxine  50 mcg Oral QAC breakfast  . multivitamin  1 tablet Oral Daily   . potassium chloride SA  20 mEq Oral Q breakfast  . risperiDONE  2 mg Oral QHS  . sevelamer carbonate  800 mg Oral TID WC    Pertinent Labs/Diagnostics: EEG results pending  Ct Abdomen Pelvis Wo Contrast  Result Date: 10/07/2017 CLINICAL DATA:  Nausea and vomiting x2 days with diarrhea. Dialysis patient. EXAM: CT ABDOMEN AND PELVIS WITHOUT CONTRAST TECHNIQUE: Multidetector CT imaging of the abdomen and pelvis was performed following the standard protocol without IV contrast. COMPARISON:  07/15/2012 abdomen ultrasound. FINDINGS: Lower chest: Top-normal size heart minimal coronary arteriosclerosis. No pericardial effusion. Respiratory motion limits assessment of the lung bases. No pneumonic consolidation, effusion or pneumothorax. Hepatobiliary: Uncomplicated cholelithiasis with 11 mm gallstone near the neck of the gallbladder by without gallbladder wall thickening or distention. The unenhanced liver is unremarkable. Pancreas: Unremarkable. No pancreatic ductal dilatation or surrounding inflammatory changes. Spleen: Normal in size without focal abnormality. Adrenals/Urinary Tract: Normal bilateral adrenal glands. No nephrolithiasis. Hypodense lesions of both kidneys statistically consistent with cysts are noted. There are slightly exophytic hyper and isodense lesions in the upper and lower pole of the right kidney, nonspecific but may represent proteinaceous or hemorrhagic complex cysts, the upper pole lesion measuring 8 mm and the interpolar lesion measuring approximately 13 mm. A third exophytic isodense lesion in the lower pole the right kidney measuring 10 mm is also noted. Nonemergent dedicated renal CT or MRI without and with contrast for better characterization is recommended and to exclude a solid abnormality. 5 mm lower pole  renal calculus with a few punctate interpolar cortical calcifications within the right kidney are identified. No hydroureteronephrosis. The urinary bladder is nondistended  which may account for the mild circumferential mural thickening. Stomach/Bowel: Stomach is within normal limits. Appendix appears normal. No evidence of bowel wall thickening, distention, or inflammatory changes. Vascular/Lymphatic: Mild aortoiliac and branch vessel atherosclerosis. No lymphadenopathy. Reproductive: Uterus and bilateral adnexa are unremarkable.2 Other: Peritoneal dialysate fluid in the abdomen and pelvis with coiling of a peritoneal dialysis catheter seen in the right hemipelvis. Musculoskeletal: Osseous sclerotic appearance of the bones consistent with renal osteodystrophy. Degenerative disc disease with discogenic endplate change at E0-P2. IMPRESSION: 1. Uncomplicated cholelithiasis. 2. Renal lesions as above some are simple in appearance and others isodense to hyperdense relative to adjacent renal cortex. These may represent complex cysts possibly proteinaceous or hemorrhagic there should be further correlated with nonemergent CT or MRI, the largest is 13 mm off the interpolar aspect of the right kidney. 3. Nonobstructing bilateral renal calculi. The largest is 5 mm in the lower pole the left kidney. 4. Moderate volume of free dialysate fluid within the abdomen and pelvis with right lower quadrant/ right hemi pelvic peritoneal dialysis catheter in place. 5. Renal osteodystrophy with degenerative disc disease L5-S1. 6. Cardiomegaly with minimal coronary arteriosclerosis. Electronically Signed   By: Ashley Royalty M.D.   On: 10/07/2017 20:53   Ct Head Wo Contrast  Result Date: 10/07/2017 CLINICAL DATA:  Slurred speech EXAM: CT HEAD WITHOUT CONTRAST TECHNIQUE: Contiguous axial images were obtained from the base of the skull through the vertex without intravenous contrast. COMPARISON:  July 12, 2017 FINDINGS: Brain: There is mild diffuse atrophy. There is no intracranial mass, hemorrhage, extra-axial fluid collection, or midline shift. There is slight small vessel disease in the centra  semiovale bilaterally. Elsewhere gray-white compartments appear normal. No evident acute infarct. Vascular: No hyperdense vessel. There are foci of atherosclerotic calcification in each carotid siphon, more on the left than on the right. Skull: The bony calvarium appears intact. Sinuses/Orbits: There is mild mucosal thickening in the medial right maxillary antrum. There is other visualized paranasal sinuses are clear. There is leftward deviation of the anterior nasal septum. Orbits appear symmetric bilaterally. Other: Mastoid air cells are clear. There is debris in each external auditory canal. IMPRESSION: Mild diffuse atrophy with mild periventricular small vessel disease. No evident acute infarct. No mass or hemorrhage. There are foci of arterial vascular calcification. There is slight right maxillary sinus disease. There is a deviated nasal septum. There is probable cerumen in each external auditory canal. Electronically Signed   By: Lowella Grip III M.D.   On: 10/07/2017 20:30   [name]  Result Date: 10/08/2017 CLINICAL DATA:  51 y.o. female with medical history significant of hypertension, schizophrenia, hyperlipidemia, bipolar disorder, mental retardation, hypothyroidism, end-stage renal disease on peritoneal dialysis brought to the emergency room after she woke up this morning with the family described as slurred speech. EXAM: MRI HEAD WITHOUT CONTRAST TECHNIQUE: Multiplanar, multiecho pulse sequences of the brain and surrounding structures were obtained without intravenous contrast. COMPARISON:  CT head 10/07/2017. FINDINGS: The patient was unable to remain motionless for the exam. Small or subtle lesions could be overlooked. In addition, some series could not be completed. Brain: No evidence for acute infarction, hemorrhage, mass lesion, hydrocephalus, or extra-axial fluid. Advanced atrophy. Minor white matter disease. Vascular: Normal flow voids. Skull and upper cervical spine:  Normal marrow signal. Expanded empty sella bold Sinuses/Orbits: Negative. Other: None. IMPRESSION: Atrophy.  Minor white matter disease. No acute intracranial findings. No cause is seen for the reported symptoms. Electronically Signed   By: Staci Righter M.D.   On: 10/08/2017 11:23    Etta Quill PA-C [facility] [phone]  Impression:  Evaluation for breakthrough seizure  Evaluation for toxic metabolic encephalopathy  Recommendations: 1) EEG final reading is pending.  If EEG shows no abnormalities no further workup neurological standpoint 2) Continue home dose of Keppra 500 mg twice a day and Depakote 500 mg twice daily 3) Will need to follow-up as outpatient with her primary neurologist within 2-3 weeks 4) Per [facility] statutes, patients with seizures are not allowed to drive until  they have been seizure-free for six months. Use caution when using heavy equipment or power tools. Avoid working on ladders or at heights. Take showers instead of baths. Ensure the water temperature is not too high on the home water heater. Do not go swimming alone. When caring for infants or small children, sit down when holding, feeding, or changing them to minimize risk of injury to the child in the event you have a seizure.   Discussed with Dr. Lucianne Lei and she is aware of plan per neurology.  Electronically signed: Dr. Kerney Elbe 10/09/2017, 10:27 AM

## 2017-10-09 NOTE — Procedures (Signed)
HPI:  51 y/o with MS change and hx of seizure  TECHNICAL SUMMARY:  A multichannel referential and bipolar montage EEG using the standard international 10-20 system was performed on the patient described as awake and drowsy.  The dominant background activity consists of 8.5 hertz activity seen most prominantly over the posterior head region.  The backgound activity is reactive to eye opening and closing procedures.  Low voltage fast (beta) activity is distributed symmetrically and maximally over the anterior head regions.  ACTIVATION:  Stepwise photic stimulation at 4-20 flashes per second was performed and did not elicit any abnormal waveforms.  Hyperventilation was performed.  EPILEPTIFORM ACTIVITY:  There were no spikes, sharp waves or paroxysmal activity.  SLEEP: Brief physiologic drowsiness is noted, but no stage II sleep.   IMPRESSION:  This is a normal EEG for the patients stated age.  There were no focal, hemispheric or lateralizing features.  No epileptiform activity was recorded.  A normal EEG does not exclude the diagnosis of a seizure disorder and if seizure remains high on the list of differential diagnosis, an ambulatory EEG may be of value.  Clinical correlation is required.

## 2017-10-09 NOTE — Progress Notes (Signed)
Patient ID: Jane [name], female   DOB: 1966-08-15, 51 y.o.   MRN: [id number]  [facility] Progress Note   Assessment/ Plan:   1. Slurred speech: based on the workup so far, suspected to remain from breakthrough seizure with atypical presentation. Continue management of her underlying seizure disorder with ongoing antiepileptic medications. 2. ESRD: continue peritoneal dialysis at current prescription, disappointing to see that PD fluid cell count not sent however culture/Gram stain sent both of which are negative so far. No clear evidence of peritonitis yet. 3. Anemia: significant hemoglobin drop noted overnight without any overt blood loss-question the validity of these results. Recommend repeat CBC and will give ESA today. 4. CKD-MBD: continue renal diet with Renvela for phosphorus binding and Sensipar for PTH suppression. 5. Seizure disorder/underlying schizoaffective disorder: continue antiepileptic medications as well as usual management for her schizoaffective disorder. 6. Hypertension: blood pressure well controlled at this point, she appears to be euvolemic on physical exam.  Subjective:   Reports to be feeling fair and remarks that her speech is still not back to baseline.   Objective:   BP 109/69 (BP Location: Right Arm)   Pulse 98   Temp 97.6 F (36.4 C) (Oral)   Resp 18   Ht 5\' 5"  (1.651 m)   Wt 75.5 kg (166 lb 7.2 oz)   SpO2 98%   BMI 27.70 kg/m   Physical Exam: Gen: comfortably sleeping in bed, easy to awaken and engage in conversation CVS: pulse regular rhythm, normal rate, normal S1 and S2 Resp: decreased breath sounds over bases-poor inspiratory effort Abd: soft, obese, mildly tender over epigastric area Ext: no lower extremity edema  Labs: BMET Recent Labs  Lab 10/07/17 1815 10/07/17 1829 10/09/17 0621  NA 133* 136 131*  K 3.8 3.9 4.9  CL 93* 92* 95*  CO2 26  --  23  GLUCOSE 112* 113* 70  BUN 29* 26* 35*  CREATININE 11.54* 10.90*  12.67*  CALCIUM 9.4  --  8.6*  PHOS  --   --  5.4*   CBC Recent Labs  Lab 10/07/17 1815 10/07/17 1829 10/09/17 0621  WBC 7.4  --  6.0  NEUTROABS 4.9  --   --   HGB 8.9* 9.9* 7.6*  HCT 28.4* 29.0* 23.3*  MCV 104.8*  --  101.3*  PLT 303  --  252   Medications:    . benztropine  1 mg Oral QHS  . cinacalcet  30 mg Oral Q breakfast   And  . cinacalcet  60 mg Oral Q supper  . cyanocobalamin  500 mcg Oral Daily  . divalproex  500 mg Oral Q12H  . heparin  5,000 Units Subcutaneous Q8H  . iron polysaccharides  150 mg Oral QODAY  . levETIRAcetam  500 mg Oral BID  . levothyroxine  50 mcg Oral QAC breakfast  . multivitamin  1 tablet Oral Daily  . potassium chloride SA  20 mEq Oral Q breakfast  . risperiDONE  2 mg Oral QHS  . sevelamer carbonate  800 mg Oral TID WC   Elmarie Shiley, MD 10/09/2017, 9:01 AM

## 2017-10-09 NOTE — Discharge Instructions (Signed)
Will need to follow-up as outpatient with her primary neurologist within 2-3 weeks  Per [facility] statutes, patients with seizures are not allowed to drive until  they have been seizure-free for six months. Use caution when using heavy equipment or power tools. Avoid working on ladders or at heights. Take showers instead of baths. Ensure the water temperature is not too high on the home water heater. Do not go swimming alone. When caring for infants or small children, sit down when holding, feeding, or changing them to minimize risk of injury to the child in the event you have a seizure.

## 2017-10-10 DIAGNOSIS — D631 Anemia in chronic kidney disease: Secondary | ICD-10-CM | POA: Diagnosis not present

## 2017-10-10 DIAGNOSIS — N186 End stage renal disease: Secondary | ICD-10-CM | POA: Diagnosis not present

## 2017-10-10 DIAGNOSIS — F29 Unspecified psychosis not due to a substance or known physiological condition: Secondary | ICD-10-CM | POA: Diagnosis not present

## 2017-10-10 DIAGNOSIS — N2581 Secondary hyperparathyroidism of renal origin: Secondary | ICD-10-CM | POA: Diagnosis not present

## 2017-10-10 LAB — HEPATITIS B SURFACE ANTIBODY,QUALITATIVE: Hep B S Ab: NONREACTIVE

## 2017-10-10 LAB — HEPATITIS B CORE ANTIBODY, IGM: Hep B C IgM: NEGATIVE

## 2017-10-10 LAB — HEPATITIS B SURFACE ANTIGEN: Hepatitis B Surface Ag: NEGATIVE

## 2017-10-11 DIAGNOSIS — N186 End stage renal disease: Secondary | ICD-10-CM | POA: Diagnosis not present

## 2017-10-11 DIAGNOSIS — E039 Hypothyroidism, unspecified: Secondary | ICD-10-CM | POA: Diagnosis not present

## 2017-10-11 DIAGNOSIS — Z30011 Encounter for initial prescription of contraceptive pills: Secondary | ICD-10-CM | POA: Diagnosis not present

## 2017-10-11 DIAGNOSIS — Z3042 Encounter for surveillance of injectable contraceptive: Secondary | ICD-10-CM | POA: Diagnosis not present

## 2017-10-11 DIAGNOSIS — Z9189 Other specified personal risk factors, not elsewhere classified: Secondary | ICD-10-CM | POA: Diagnosis not present

## 2017-10-11 DIAGNOSIS — E785 Hyperlipidemia, unspecified: Secondary | ICD-10-CM | POA: Diagnosis not present

## 2017-10-11 DIAGNOSIS — R569 Unspecified convulsions: Secondary | ICD-10-CM | POA: Diagnosis not present

## 2017-10-11 DIAGNOSIS — D631 Anemia in chronic kidney disease: Secondary | ICD-10-CM | POA: Diagnosis not present

## 2017-10-11 DIAGNOSIS — N2581 Secondary hyperparathyroidism of renal origin: Secondary | ICD-10-CM | POA: Diagnosis not present

## 2017-10-11 DIAGNOSIS — D539 Nutritional anemia, unspecified: Secondary | ICD-10-CM | POA: Diagnosis not present

## 2017-10-12 DIAGNOSIS — N2581 Secondary hyperparathyroidism of renal origin: Secondary | ICD-10-CM | POA: Diagnosis not present

## 2017-10-12 DIAGNOSIS — D631 Anemia in chronic kidney disease: Secondary | ICD-10-CM | POA: Diagnosis not present

## 2017-10-12 DIAGNOSIS — N186 End stage renal disease: Secondary | ICD-10-CM | POA: Diagnosis not present

## 2017-10-13 DIAGNOSIS — N186 End stage renal disease: Secondary | ICD-10-CM | POA: Diagnosis not present

## 2017-10-13 DIAGNOSIS — D631 Anemia in chronic kidney disease: Secondary | ICD-10-CM | POA: Diagnosis not present

## 2017-10-13 DIAGNOSIS — N2581 Secondary hyperparathyroidism of renal origin: Secondary | ICD-10-CM | POA: Diagnosis not present

## 2017-10-14 DIAGNOSIS — N186 End stage renal disease: Secondary | ICD-10-CM | POA: Diagnosis not present

## 2017-10-14 DIAGNOSIS — N2581 Secondary hyperparathyroidism of renal origin: Secondary | ICD-10-CM | POA: Diagnosis not present

## 2017-10-14 DIAGNOSIS — D631 Anemia in chronic kidney disease: Secondary | ICD-10-CM | POA: Diagnosis not present

## 2017-10-15 DIAGNOSIS — N186 End stage renal disease: Secondary | ICD-10-CM | POA: Diagnosis not present

## 2017-10-15 DIAGNOSIS — D631 Anemia in chronic kidney disease: Secondary | ICD-10-CM | POA: Diagnosis not present

## 2017-10-15 DIAGNOSIS — N2581 Secondary hyperparathyroidism of renal origin: Secondary | ICD-10-CM | POA: Diagnosis not present

## 2017-10-15 LAB — CULTURE, BODY FLUID W GRAM STAIN -BOTTLE

## 2017-10-15 LAB — CULTURE, BODY FLUID-BOTTLE

## 2017-10-16 DIAGNOSIS — D631 Anemia in chronic kidney disease: Secondary | ICD-10-CM | POA: Diagnosis not present

## 2017-10-16 DIAGNOSIS — N2581 Secondary hyperparathyroidism of renal origin: Secondary | ICD-10-CM | POA: Diagnosis not present

## 2017-10-16 DIAGNOSIS — N186 End stage renal disease: Secondary | ICD-10-CM | POA: Diagnosis not present

## 2017-10-17 DIAGNOSIS — D631 Anemia in chronic kidney disease: Secondary | ICD-10-CM | POA: Diagnosis not present

## 2017-10-17 DIAGNOSIS — N186 End stage renal disease: Secondary | ICD-10-CM | POA: Diagnosis not present

## 2017-10-17 DIAGNOSIS — N2581 Secondary hyperparathyroidism of renal origin: Secondary | ICD-10-CM | POA: Diagnosis not present

## 2017-10-18 DIAGNOSIS — N186 End stage renal disease: Secondary | ICD-10-CM | POA: Diagnosis not present

## 2017-10-18 DIAGNOSIS — N2581 Secondary hyperparathyroidism of renal origin: Secondary | ICD-10-CM | POA: Diagnosis not present

## 2017-10-18 DIAGNOSIS — D631 Anemia in chronic kidney disease: Secondary | ICD-10-CM | POA: Diagnosis not present

## 2017-10-19 DIAGNOSIS — N2581 Secondary hyperparathyroidism of renal origin: Secondary | ICD-10-CM | POA: Diagnosis not present

## 2017-10-19 DIAGNOSIS — N186 End stage renal disease: Secondary | ICD-10-CM | POA: Diagnosis not present

## 2017-10-19 DIAGNOSIS — D631 Anemia in chronic kidney disease: Secondary | ICD-10-CM | POA: Diagnosis not present

## 2017-10-20 DIAGNOSIS — N2581 Secondary hyperparathyroidism of renal origin: Secondary | ICD-10-CM | POA: Diagnosis not present

## 2017-10-20 DIAGNOSIS — N186 End stage renal disease: Secondary | ICD-10-CM | POA: Diagnosis not present

## 2017-10-20 DIAGNOSIS — D631 Anemia in chronic kidney disease: Secondary | ICD-10-CM | POA: Diagnosis not present

## 2017-10-21 DIAGNOSIS — N2581 Secondary hyperparathyroidism of renal origin: Secondary | ICD-10-CM | POA: Diagnosis not present

## 2017-10-21 DIAGNOSIS — D631 Anemia in chronic kidney disease: Secondary | ICD-10-CM | POA: Diagnosis not present

## 2017-10-21 DIAGNOSIS — N186 End stage renal disease: Secondary | ICD-10-CM | POA: Diagnosis not present

## 2017-10-22 DIAGNOSIS — D631 Anemia in chronic kidney disease: Secondary | ICD-10-CM | POA: Diagnosis not present

## 2017-10-22 DIAGNOSIS — N2581 Secondary hyperparathyroidism of renal origin: Secondary | ICD-10-CM | POA: Diagnosis not present

## 2017-10-22 DIAGNOSIS — N186 End stage renal disease: Secondary | ICD-10-CM | POA: Diagnosis not present

## 2017-10-22 DIAGNOSIS — Z992 Dependence on renal dialysis: Secondary | ICD-10-CM | POA: Diagnosis not present

## 2017-10-23 DIAGNOSIS — N186 End stage renal disease: Secondary | ICD-10-CM | POA: Diagnosis not present

## 2017-10-23 DIAGNOSIS — D631 Anemia in chronic kidney disease: Secondary | ICD-10-CM | POA: Diagnosis not present

## 2017-10-23 DIAGNOSIS — N2581 Secondary hyperparathyroidism of renal origin: Secondary | ICD-10-CM | POA: Diagnosis not present

## 2017-10-24 DIAGNOSIS — N2581 Secondary hyperparathyroidism of renal origin: Secondary | ICD-10-CM | POA: Diagnosis not present

## 2017-10-24 DIAGNOSIS — Z1159 Encounter for screening for other viral diseases: Secondary | ICD-10-CM | POA: Diagnosis not present

## 2017-10-24 DIAGNOSIS — D631 Anemia in chronic kidney disease: Secondary | ICD-10-CM | POA: Diagnosis not present

## 2017-10-24 DIAGNOSIS — N186 End stage renal disease: Secondary | ICD-10-CM | POA: Diagnosis not present

## 2017-10-25 DIAGNOSIS — D631 Anemia in chronic kidney disease: Secondary | ICD-10-CM | POA: Diagnosis not present

## 2017-10-25 DIAGNOSIS — N2581 Secondary hyperparathyroidism of renal origin: Secondary | ICD-10-CM | POA: Diagnosis not present

## 2017-10-25 DIAGNOSIS — N186 End stage renal disease: Secondary | ICD-10-CM | POA: Diagnosis not present

## 2017-10-26 DIAGNOSIS — N2581 Secondary hyperparathyroidism of renal origin: Secondary | ICD-10-CM | POA: Diagnosis not present

## 2017-10-26 DIAGNOSIS — D631 Anemia in chronic kidney disease: Secondary | ICD-10-CM | POA: Diagnosis not present

## 2017-10-26 DIAGNOSIS — N186 End stage renal disease: Secondary | ICD-10-CM | POA: Diagnosis not present

## 2017-10-27 DIAGNOSIS — N186 End stage renal disease: Secondary | ICD-10-CM | POA: Diagnosis not present

## 2017-10-27 DIAGNOSIS — D631 Anemia in chronic kidney disease: Secondary | ICD-10-CM | POA: Diagnosis not present

## 2017-10-27 DIAGNOSIS — N2581 Secondary hyperparathyroidism of renal origin: Secondary | ICD-10-CM | POA: Diagnosis not present

## 2017-10-28 DIAGNOSIS — D631 Anemia in chronic kidney disease: Secondary | ICD-10-CM | POA: Diagnosis not present

## 2017-10-28 DIAGNOSIS — N186 End stage renal disease: Secondary | ICD-10-CM | POA: Diagnosis not present

## 2017-10-28 DIAGNOSIS — N2581 Secondary hyperparathyroidism of renal origin: Secondary | ICD-10-CM | POA: Diagnosis not present

## 2017-10-29 DIAGNOSIS — N2581 Secondary hyperparathyroidism of renal origin: Secondary | ICD-10-CM | POA: Diagnosis not present

## 2017-10-29 DIAGNOSIS — D631 Anemia in chronic kidney disease: Secondary | ICD-10-CM | POA: Diagnosis not present

## 2017-10-29 DIAGNOSIS — N186 End stage renal disease: Secondary | ICD-10-CM | POA: Diagnosis not present

## 2017-10-29 DIAGNOSIS — Z4932 Encounter for adequacy testing for peritoneal dialysis: Secondary | ICD-10-CM | POA: Diagnosis not present

## 2017-10-30 DIAGNOSIS — N2581 Secondary hyperparathyroidism of renal origin: Secondary | ICD-10-CM | POA: Diagnosis not present

## 2017-10-30 DIAGNOSIS — N186 End stage renal disease: Secondary | ICD-10-CM | POA: Diagnosis not present

## 2017-10-30 DIAGNOSIS — D631 Anemia in chronic kidney disease: Secondary | ICD-10-CM | POA: Diagnosis not present

## 2017-10-31 ENCOUNTER — Other Ambulatory Visit: Payer: Self-pay | Admitting: Internal Medicine

## 2017-10-31 DIAGNOSIS — D631 Anemia in chronic kidney disease: Secondary | ICD-10-CM | POA: Diagnosis not present

## 2017-10-31 DIAGNOSIS — N186 End stage renal disease: Secondary | ICD-10-CM | POA: Diagnosis not present

## 2017-10-31 DIAGNOSIS — Z1231 Encounter for screening mammogram for malignant neoplasm of breast: Secondary | ICD-10-CM

## 2017-10-31 DIAGNOSIS — N2581 Secondary hyperparathyroidism of renal origin: Secondary | ICD-10-CM | POA: Diagnosis not present

## 2017-11-01 DIAGNOSIS — D631 Anemia in chronic kidney disease: Secondary | ICD-10-CM | POA: Diagnosis not present

## 2017-11-01 DIAGNOSIS — N2581 Secondary hyperparathyroidism of renal origin: Secondary | ICD-10-CM | POA: Diagnosis not present

## 2017-11-01 DIAGNOSIS — N186 End stage renal disease: Secondary | ICD-10-CM | POA: Diagnosis not present

## 2017-11-02 DIAGNOSIS — N186 End stage renal disease: Secondary | ICD-10-CM | POA: Diagnosis not present

## 2017-11-02 DIAGNOSIS — N2581 Secondary hyperparathyroidism of renal origin: Secondary | ICD-10-CM | POA: Diagnosis not present

## 2017-11-02 DIAGNOSIS — D631 Anemia in chronic kidney disease: Secondary | ICD-10-CM | POA: Diagnosis not present

## 2017-11-03 DIAGNOSIS — D631 Anemia in chronic kidney disease: Secondary | ICD-10-CM | POA: Diagnosis not present

## 2017-11-03 DIAGNOSIS — N2581 Secondary hyperparathyroidism of renal origin: Secondary | ICD-10-CM | POA: Diagnosis not present

## 2017-11-03 DIAGNOSIS — N186 End stage renal disease: Secondary | ICD-10-CM | POA: Diagnosis not present

## 2017-11-04 DIAGNOSIS — N186 End stage renal disease: Secondary | ICD-10-CM | POA: Diagnosis not present

## 2017-11-04 DIAGNOSIS — N2581 Secondary hyperparathyroidism of renal origin: Secondary | ICD-10-CM | POA: Diagnosis not present

## 2017-11-04 DIAGNOSIS — D631 Anemia in chronic kidney disease: Secondary | ICD-10-CM | POA: Diagnosis not present

## 2017-11-05 DIAGNOSIS — D631 Anemia in chronic kidney disease: Secondary | ICD-10-CM | POA: Diagnosis not present

## 2017-11-05 DIAGNOSIS — N2581 Secondary hyperparathyroidism of renal origin: Secondary | ICD-10-CM | POA: Diagnosis not present

## 2017-11-05 DIAGNOSIS — N186 End stage renal disease: Secondary | ICD-10-CM | POA: Diagnosis not present

## 2017-11-06 DIAGNOSIS — N2581 Secondary hyperparathyroidism of renal origin: Secondary | ICD-10-CM | POA: Diagnosis not present

## 2017-11-06 DIAGNOSIS — D631 Anemia in chronic kidney disease: Secondary | ICD-10-CM | POA: Diagnosis not present

## 2017-11-06 DIAGNOSIS — N186 End stage renal disease: Secondary | ICD-10-CM | POA: Diagnosis not present

## 2017-11-07 DIAGNOSIS — D539 Nutritional anemia, unspecified: Secondary | ICD-10-CM | POA: Diagnosis not present

## 2017-11-07 DIAGNOSIS — E039 Hypothyroidism, unspecified: Secondary | ICD-10-CM | POA: Diagnosis not present

## 2017-11-07 DIAGNOSIS — E785 Hyperlipidemia, unspecified: Secondary | ICD-10-CM | POA: Diagnosis not present

## 2017-11-07 DIAGNOSIS — Z0000 Encounter for general adult medical examination without abnormal findings: Secondary | ICD-10-CM | POA: Diagnosis not present

## 2017-11-07 DIAGNOSIS — N2581 Secondary hyperparathyroidism of renal origin: Secondary | ICD-10-CM | POA: Diagnosis not present

## 2017-11-07 DIAGNOSIS — N186 End stage renal disease: Secondary | ICD-10-CM | POA: Diagnosis not present

## 2017-11-07 DIAGNOSIS — Z9189 Other specified personal risk factors, not elsewhere classified: Secondary | ICD-10-CM | POA: Diagnosis not present

## 2017-11-07 DIAGNOSIS — I953 Hypotension of hemodialysis: Secondary | ICD-10-CM | POA: Diagnosis not present

## 2017-11-07 DIAGNOSIS — R569 Unspecified convulsions: Secondary | ICD-10-CM | POA: Diagnosis not present

## 2017-11-07 DIAGNOSIS — D631 Anemia in chronic kidney disease: Secondary | ICD-10-CM | POA: Diagnosis not present

## 2017-11-08 DIAGNOSIS — N186 End stage renal disease: Secondary | ICD-10-CM | POA: Diagnosis not present

## 2017-11-08 DIAGNOSIS — D631 Anemia in chronic kidney disease: Secondary | ICD-10-CM | POA: Diagnosis not present

## 2017-11-08 DIAGNOSIS — N2581 Secondary hyperparathyroidism of renal origin: Secondary | ICD-10-CM | POA: Diagnosis not present

## 2017-11-09 DIAGNOSIS — N186 End stage renal disease: Secondary | ICD-10-CM | POA: Diagnosis not present

## 2017-11-09 DIAGNOSIS — D631 Anemia in chronic kidney disease: Secondary | ICD-10-CM | POA: Diagnosis not present

## 2017-11-09 DIAGNOSIS — N2581 Secondary hyperparathyroidism of renal origin: Secondary | ICD-10-CM | POA: Diagnosis not present

## 2017-11-10 DIAGNOSIS — N2581 Secondary hyperparathyroidism of renal origin: Secondary | ICD-10-CM | POA: Diagnosis not present

## 2017-11-10 DIAGNOSIS — D631 Anemia in chronic kidney disease: Secondary | ICD-10-CM | POA: Diagnosis not present

## 2017-11-10 DIAGNOSIS — N186 End stage renal disease: Secondary | ICD-10-CM | POA: Diagnosis not present

## 2017-11-11 DIAGNOSIS — N186 End stage renal disease: Secondary | ICD-10-CM | POA: Diagnosis not present

## 2017-11-11 DIAGNOSIS — D631 Anemia in chronic kidney disease: Secondary | ICD-10-CM | POA: Diagnosis not present

## 2017-11-11 DIAGNOSIS — N2581 Secondary hyperparathyroidism of renal origin: Secondary | ICD-10-CM | POA: Diagnosis not present

## 2017-11-12 DIAGNOSIS — N2581 Secondary hyperparathyroidism of renal origin: Secondary | ICD-10-CM | POA: Diagnosis not present

## 2017-11-12 DIAGNOSIS — D631 Anemia in chronic kidney disease: Secondary | ICD-10-CM | POA: Diagnosis not present

## 2017-11-12 DIAGNOSIS — N186 End stage renal disease: Secondary | ICD-10-CM | POA: Diagnosis not present

## 2017-11-13 DIAGNOSIS — N2581 Secondary hyperparathyroidism of renal origin: Secondary | ICD-10-CM | POA: Diagnosis not present

## 2017-11-13 DIAGNOSIS — D631 Anemia in chronic kidney disease: Secondary | ICD-10-CM | POA: Diagnosis not present

## 2017-11-13 DIAGNOSIS — N186 End stage renal disease: Secondary | ICD-10-CM | POA: Diagnosis not present

## 2017-11-14 DIAGNOSIS — N186 End stage renal disease: Secondary | ICD-10-CM | POA: Diagnosis not present

## 2017-11-14 DIAGNOSIS — N2581 Secondary hyperparathyroidism of renal origin: Secondary | ICD-10-CM | POA: Diagnosis not present

## 2017-11-14 DIAGNOSIS — D631 Anemia in chronic kidney disease: Secondary | ICD-10-CM | POA: Diagnosis not present

## 2017-11-15 DIAGNOSIS — N2581 Secondary hyperparathyroidism of renal origin: Secondary | ICD-10-CM | POA: Diagnosis not present

## 2017-11-15 DIAGNOSIS — N186 End stage renal disease: Secondary | ICD-10-CM | POA: Diagnosis not present

## 2017-11-15 DIAGNOSIS — D631 Anemia in chronic kidney disease: Secondary | ICD-10-CM | POA: Diagnosis not present

## 2017-11-16 DIAGNOSIS — N186 End stage renal disease: Secondary | ICD-10-CM | POA: Diagnosis not present

## 2017-11-16 DIAGNOSIS — N2581 Secondary hyperparathyroidism of renal origin: Secondary | ICD-10-CM | POA: Diagnosis not present

## 2017-11-16 DIAGNOSIS — D631 Anemia in chronic kidney disease: Secondary | ICD-10-CM | POA: Diagnosis not present

## 2017-11-17 DIAGNOSIS — N2581 Secondary hyperparathyroidism of renal origin: Secondary | ICD-10-CM | POA: Diagnosis not present

## 2017-11-17 DIAGNOSIS — D631 Anemia in chronic kidney disease: Secondary | ICD-10-CM | POA: Diagnosis not present

## 2017-11-17 DIAGNOSIS — N186 End stage renal disease: Secondary | ICD-10-CM | POA: Diagnosis not present

## 2017-11-18 DIAGNOSIS — N2581 Secondary hyperparathyroidism of renal origin: Secondary | ICD-10-CM | POA: Diagnosis not present

## 2017-11-18 DIAGNOSIS — D631 Anemia in chronic kidney disease: Secondary | ICD-10-CM | POA: Diagnosis not present

## 2017-11-18 DIAGNOSIS — N186 End stage renal disease: Secondary | ICD-10-CM | POA: Diagnosis not present

## 2017-11-19 DIAGNOSIS — N186 End stage renal disease: Secondary | ICD-10-CM | POA: Diagnosis not present

## 2017-11-19 DIAGNOSIS — D631 Anemia in chronic kidney disease: Secondary | ICD-10-CM | POA: Diagnosis not present

## 2017-11-19 DIAGNOSIS — N2581 Secondary hyperparathyroidism of renal origin: Secondary | ICD-10-CM | POA: Diagnosis not present

## 2017-11-20 DIAGNOSIS — N186 End stage renal disease: Secondary | ICD-10-CM | POA: Diagnosis not present

## 2017-11-20 DIAGNOSIS — D631 Anemia in chronic kidney disease: Secondary | ICD-10-CM | POA: Diagnosis not present

## 2017-11-20 DIAGNOSIS — N2581 Secondary hyperparathyroidism of renal origin: Secondary | ICD-10-CM | POA: Diagnosis not present

## 2017-11-21 DIAGNOSIS — N186 End stage renal disease: Secondary | ICD-10-CM | POA: Diagnosis not present

## 2017-11-21 DIAGNOSIS — D631 Anemia in chronic kidney disease: Secondary | ICD-10-CM | POA: Diagnosis not present

## 2017-11-21 DIAGNOSIS — N2581 Secondary hyperparathyroidism of renal origin: Secondary | ICD-10-CM | POA: Diagnosis not present

## 2017-11-22 DIAGNOSIS — N2581 Secondary hyperparathyroidism of renal origin: Secondary | ICD-10-CM | POA: Diagnosis not present

## 2017-11-22 DIAGNOSIS — Z992 Dependence on renal dialysis: Secondary | ICD-10-CM | POA: Diagnosis not present

## 2017-11-22 DIAGNOSIS — N186 End stage renal disease: Secondary | ICD-10-CM | POA: Diagnosis not present

## 2017-11-22 DIAGNOSIS — D631 Anemia in chronic kidney disease: Secondary | ICD-10-CM | POA: Diagnosis not present

## 2017-11-23 DIAGNOSIS — N2581 Secondary hyperparathyroidism of renal origin: Secondary | ICD-10-CM | POA: Diagnosis not present

## 2017-11-23 DIAGNOSIS — D631 Anemia in chronic kidney disease: Secondary | ICD-10-CM | POA: Diagnosis not present

## 2017-11-23 DIAGNOSIS — N186 End stage renal disease: Secondary | ICD-10-CM | POA: Diagnosis not present

## 2017-11-24 DIAGNOSIS — N2581 Secondary hyperparathyroidism of renal origin: Secondary | ICD-10-CM | POA: Diagnosis not present

## 2017-11-24 DIAGNOSIS — D631 Anemia in chronic kidney disease: Secondary | ICD-10-CM | POA: Diagnosis not present

## 2017-11-24 DIAGNOSIS — N186 End stage renal disease: Secondary | ICD-10-CM | POA: Diagnosis not present

## 2017-11-25 DIAGNOSIS — N2581 Secondary hyperparathyroidism of renal origin: Secondary | ICD-10-CM | POA: Diagnosis not present

## 2017-11-25 DIAGNOSIS — N186 End stage renal disease: Secondary | ICD-10-CM | POA: Diagnosis not present

## 2017-11-25 DIAGNOSIS — D631 Anemia in chronic kidney disease: Secondary | ICD-10-CM | POA: Diagnosis not present

## 2017-11-26 DIAGNOSIS — N2581 Secondary hyperparathyroidism of renal origin: Secondary | ICD-10-CM | POA: Diagnosis not present

## 2017-11-26 DIAGNOSIS — D631 Anemia in chronic kidney disease: Secondary | ICD-10-CM | POA: Diagnosis not present

## 2017-11-26 DIAGNOSIS — N186 End stage renal disease: Secondary | ICD-10-CM | POA: Diagnosis not present

## 2017-11-27 DIAGNOSIS — N186 End stage renal disease: Secondary | ICD-10-CM | POA: Diagnosis not present

## 2017-11-27 DIAGNOSIS — D631 Anemia in chronic kidney disease: Secondary | ICD-10-CM | POA: Diagnosis not present

## 2017-11-27 DIAGNOSIS — N2581 Secondary hyperparathyroidism of renal origin: Secondary | ICD-10-CM | POA: Diagnosis not present

## 2017-11-28 DIAGNOSIS — D631 Anemia in chronic kidney disease: Secondary | ICD-10-CM | POA: Diagnosis not present

## 2017-11-28 DIAGNOSIS — N186 End stage renal disease: Secondary | ICD-10-CM | POA: Diagnosis not present

## 2017-11-28 DIAGNOSIS — N2581 Secondary hyperparathyroidism of renal origin: Secondary | ICD-10-CM | POA: Diagnosis not present

## 2017-11-29 DIAGNOSIS — N2581 Secondary hyperparathyroidism of renal origin: Secondary | ICD-10-CM | POA: Diagnosis not present

## 2017-11-29 DIAGNOSIS — D631 Anemia in chronic kidney disease: Secondary | ICD-10-CM | POA: Diagnosis not present

## 2017-11-29 DIAGNOSIS — N186 End stage renal disease: Secondary | ICD-10-CM | POA: Diagnosis not present

## 2017-11-30 DIAGNOSIS — D631 Anemia in chronic kidney disease: Secondary | ICD-10-CM | POA: Diagnosis not present

## 2017-11-30 DIAGNOSIS — N186 End stage renal disease: Secondary | ICD-10-CM | POA: Diagnosis not present

## 2017-11-30 DIAGNOSIS — N2581 Secondary hyperparathyroidism of renal origin: Secondary | ICD-10-CM | POA: Diagnosis not present

## 2017-12-01 DIAGNOSIS — D631 Anemia in chronic kidney disease: Secondary | ICD-10-CM | POA: Diagnosis not present

## 2017-12-01 DIAGNOSIS — N2581 Secondary hyperparathyroidism of renal origin: Secondary | ICD-10-CM | POA: Diagnosis not present

## 2017-12-01 DIAGNOSIS — N186 End stage renal disease: Secondary | ICD-10-CM | POA: Diagnosis not present

## 2017-12-02 DIAGNOSIS — N2581 Secondary hyperparathyroidism of renal origin: Secondary | ICD-10-CM | POA: Diagnosis not present

## 2017-12-02 DIAGNOSIS — D631 Anemia in chronic kidney disease: Secondary | ICD-10-CM | POA: Diagnosis not present

## 2017-12-02 DIAGNOSIS — N186 End stage renal disease: Secondary | ICD-10-CM | POA: Diagnosis not present

## 2017-12-03 ENCOUNTER — Ambulatory Visit
Admission: RE | Admit: 2017-12-03 | Discharge: 2017-12-03 | Disposition: A | Discharge status: Home / Self Care | Payer: Medicare Other | Source: Ambulatory Visit | Attending: Internal Medicine | Admitting: Internal Medicine

## 2017-12-03 DIAGNOSIS — Z1231 Encounter for screening mammogram for malignant neoplasm of breast: Secondary | ICD-10-CM

## 2017-12-03 DIAGNOSIS — N186 End stage renal disease: Secondary | ICD-10-CM | POA: Diagnosis not present

## 2017-12-03 DIAGNOSIS — D631 Anemia in chronic kidney disease: Secondary | ICD-10-CM | POA: Diagnosis not present

## 2017-12-03 DIAGNOSIS — N2581 Secondary hyperparathyroidism of renal origin: Secondary | ICD-10-CM | POA: Diagnosis not present

## 2017-12-04 DIAGNOSIS — D631 Anemia in chronic kidney disease: Secondary | ICD-10-CM | POA: Diagnosis not present

## 2017-12-04 DIAGNOSIS — F29 Unspecified psychosis not due to a substance or known physiological condition: Secondary | ICD-10-CM | POA: Diagnosis not present

## 2017-12-04 DIAGNOSIS — N186 End stage renal disease: Secondary | ICD-10-CM | POA: Diagnosis not present

## 2017-12-04 DIAGNOSIS — N2581 Secondary hyperparathyroidism of renal origin: Secondary | ICD-10-CM | POA: Diagnosis not present

## 2017-12-05 DIAGNOSIS — D631 Anemia in chronic kidney disease: Secondary | ICD-10-CM | POA: Diagnosis not present

## 2017-12-05 DIAGNOSIS — N186 End stage renal disease: Secondary | ICD-10-CM | POA: Diagnosis not present

## 2017-12-05 DIAGNOSIS — N2581 Secondary hyperparathyroidism of renal origin: Secondary | ICD-10-CM | POA: Diagnosis not present

## 2017-12-06 DIAGNOSIS — N186 End stage renal disease: Secondary | ICD-10-CM | POA: Diagnosis not present

## 2017-12-06 DIAGNOSIS — N2581 Secondary hyperparathyroidism of renal origin: Secondary | ICD-10-CM | POA: Diagnosis not present

## 2017-12-06 DIAGNOSIS — D631 Anemia in chronic kidney disease: Secondary | ICD-10-CM | POA: Diagnosis not present

## 2017-12-07 DIAGNOSIS — N186 End stage renal disease: Secondary | ICD-10-CM | POA: Diagnosis not present

## 2017-12-07 DIAGNOSIS — D631 Anemia in chronic kidney disease: Secondary | ICD-10-CM | POA: Diagnosis not present

## 2017-12-07 DIAGNOSIS — N2581 Secondary hyperparathyroidism of renal origin: Secondary | ICD-10-CM | POA: Diagnosis not present

## 2017-12-08 DIAGNOSIS — D631 Anemia in chronic kidney disease: Secondary | ICD-10-CM | POA: Diagnosis not present

## 2017-12-08 DIAGNOSIS — N186 End stage renal disease: Secondary | ICD-10-CM | POA: Diagnosis not present

## 2017-12-08 DIAGNOSIS — N2581 Secondary hyperparathyroidism of renal origin: Secondary | ICD-10-CM | POA: Diagnosis not present

## 2017-12-09 DIAGNOSIS — D631 Anemia in chronic kidney disease: Secondary | ICD-10-CM | POA: Diagnosis not present

## 2017-12-09 DIAGNOSIS — N2581 Secondary hyperparathyroidism of renal origin: Secondary | ICD-10-CM | POA: Diagnosis not present

## 2017-12-09 DIAGNOSIS — N186 End stage renal disease: Secondary | ICD-10-CM | POA: Diagnosis not present

## 2017-12-10 DIAGNOSIS — N2581 Secondary hyperparathyroidism of renal origin: Secondary | ICD-10-CM | POA: Diagnosis not present

## 2017-12-10 DIAGNOSIS — D631 Anemia in chronic kidney disease: Secondary | ICD-10-CM | POA: Diagnosis not present

## 2017-12-10 DIAGNOSIS — N186 End stage renal disease: Secondary | ICD-10-CM | POA: Diagnosis not present

## 2017-12-11 DIAGNOSIS — D631 Anemia in chronic kidney disease: Secondary | ICD-10-CM | POA: Diagnosis not present

## 2017-12-11 DIAGNOSIS — N186 End stage renal disease: Secondary | ICD-10-CM | POA: Diagnosis not present

## 2017-12-11 DIAGNOSIS — N2581 Secondary hyperparathyroidism of renal origin: Secondary | ICD-10-CM | POA: Diagnosis not present

## 2017-12-12 DIAGNOSIS — N2581 Secondary hyperparathyroidism of renal origin: Secondary | ICD-10-CM | POA: Diagnosis not present

## 2017-12-12 DIAGNOSIS — N186 End stage renal disease: Secondary | ICD-10-CM | POA: Diagnosis not present

## 2017-12-12 DIAGNOSIS — D631 Anemia in chronic kidney disease: Secondary | ICD-10-CM | POA: Diagnosis not present

## 2017-12-13 DIAGNOSIS — D631 Anemia in chronic kidney disease: Secondary | ICD-10-CM | POA: Diagnosis not present

## 2017-12-13 DIAGNOSIS — N2581 Secondary hyperparathyroidism of renal origin: Secondary | ICD-10-CM | POA: Diagnosis not present

## 2017-12-13 DIAGNOSIS — N186 End stage renal disease: Secondary | ICD-10-CM | POA: Diagnosis not present

## 2017-12-14 DIAGNOSIS — N2581 Secondary hyperparathyroidism of renal origin: Secondary | ICD-10-CM | POA: Diagnosis not present

## 2017-12-14 DIAGNOSIS — N186 End stage renal disease: Secondary | ICD-10-CM | POA: Diagnosis not present

## 2017-12-14 DIAGNOSIS — D631 Anemia in chronic kidney disease: Secondary | ICD-10-CM | POA: Diagnosis not present

## 2017-12-15 DIAGNOSIS — N186 End stage renal disease: Secondary | ICD-10-CM | POA: Diagnosis not present

## 2017-12-15 DIAGNOSIS — N2581 Secondary hyperparathyroidism of renal origin: Secondary | ICD-10-CM | POA: Diagnosis not present

## 2017-12-15 DIAGNOSIS — D631 Anemia in chronic kidney disease: Secondary | ICD-10-CM | POA: Diagnosis not present

## 2017-12-16 DIAGNOSIS — N186 End stage renal disease: Secondary | ICD-10-CM | POA: Diagnosis not present

## 2017-12-16 DIAGNOSIS — D631 Anemia in chronic kidney disease: Secondary | ICD-10-CM | POA: Diagnosis not present

## 2017-12-16 DIAGNOSIS — N2581 Secondary hyperparathyroidism of renal origin: Secondary | ICD-10-CM | POA: Diagnosis not present

## 2017-12-17 DIAGNOSIS — N2581 Secondary hyperparathyroidism of renal origin: Secondary | ICD-10-CM | POA: Diagnosis not present

## 2017-12-17 DIAGNOSIS — N186 End stage renal disease: Secondary | ICD-10-CM | POA: Diagnosis not present

## 2017-12-17 DIAGNOSIS — D631 Anemia in chronic kidney disease: Secondary | ICD-10-CM | POA: Diagnosis not present

## 2017-12-18 DIAGNOSIS — D631 Anemia in chronic kidney disease: Secondary | ICD-10-CM | POA: Diagnosis not present

## 2017-12-18 DIAGNOSIS — N2581 Secondary hyperparathyroidism of renal origin: Secondary | ICD-10-CM | POA: Diagnosis not present

## 2017-12-18 DIAGNOSIS — N186 End stage renal disease: Secondary | ICD-10-CM | POA: Diagnosis not present

## 2017-12-19 DIAGNOSIS — D631 Anemia in chronic kidney disease: Secondary | ICD-10-CM | POA: Diagnosis not present

## 2017-12-19 DIAGNOSIS — N186 End stage renal disease: Secondary | ICD-10-CM | POA: Diagnosis not present

## 2017-12-19 DIAGNOSIS — N2581 Secondary hyperparathyroidism of renal origin: Secondary | ICD-10-CM | POA: Diagnosis not present

## 2017-12-20 DIAGNOSIS — N2581 Secondary hyperparathyroidism of renal origin: Secondary | ICD-10-CM | POA: Diagnosis not present

## 2017-12-20 DIAGNOSIS — N186 End stage renal disease: Secondary | ICD-10-CM | POA: Diagnosis not present

## 2017-12-20 DIAGNOSIS — Z992 Dependence on renal dialysis: Secondary | ICD-10-CM | POA: Diagnosis not present

## 2017-12-20 DIAGNOSIS — D631 Anemia in chronic kidney disease: Secondary | ICD-10-CM | POA: Diagnosis not present

## 2017-12-21 DIAGNOSIS — N186 End stage renal disease: Secondary | ICD-10-CM | POA: Diagnosis not present

## 2017-12-21 DIAGNOSIS — N2581 Secondary hyperparathyroidism of renal origin: Secondary | ICD-10-CM | POA: Diagnosis not present

## 2017-12-21 DIAGNOSIS — D631 Anemia in chronic kidney disease: Secondary | ICD-10-CM | POA: Diagnosis not present

## 2017-12-22 DIAGNOSIS — D631 Anemia in chronic kidney disease: Secondary | ICD-10-CM | POA: Diagnosis not present

## 2017-12-22 DIAGNOSIS — N2581 Secondary hyperparathyroidism of renal origin: Secondary | ICD-10-CM | POA: Diagnosis not present

## 2017-12-22 DIAGNOSIS — N186 End stage renal disease: Secondary | ICD-10-CM | POA: Diagnosis not present

## 2017-12-23 DIAGNOSIS — D631 Anemia in chronic kidney disease: Secondary | ICD-10-CM | POA: Diagnosis not present

## 2017-12-23 DIAGNOSIS — N2581 Secondary hyperparathyroidism of renal origin: Secondary | ICD-10-CM | POA: Diagnosis not present

## 2017-12-23 DIAGNOSIS — N186 End stage renal disease: Secondary | ICD-10-CM | POA: Diagnosis not present

## 2017-12-24 DIAGNOSIS — D631 Anemia in chronic kidney disease: Secondary | ICD-10-CM | POA: Diagnosis not present

## 2017-12-24 DIAGNOSIS — N186 End stage renal disease: Secondary | ICD-10-CM | POA: Diagnosis not present

## 2017-12-24 DIAGNOSIS — N2581 Secondary hyperparathyroidism of renal origin: Secondary | ICD-10-CM | POA: Diagnosis not present

## 2017-12-25 DIAGNOSIS — N186 End stage renal disease: Secondary | ICD-10-CM | POA: Diagnosis not present

## 2017-12-25 DIAGNOSIS — D631 Anemia in chronic kidney disease: Secondary | ICD-10-CM | POA: Diagnosis not present

## 2017-12-25 DIAGNOSIS — N2581 Secondary hyperparathyroidism of renal origin: Secondary | ICD-10-CM | POA: Diagnosis not present

## 2017-12-26 DIAGNOSIS — D631 Anemia in chronic kidney disease: Secondary | ICD-10-CM | POA: Diagnosis not present

## 2017-12-26 DIAGNOSIS — N2581 Secondary hyperparathyroidism of renal origin: Secondary | ICD-10-CM | POA: Diagnosis not present

## 2017-12-26 DIAGNOSIS — N186 End stage renal disease: Secondary | ICD-10-CM | POA: Diagnosis not present

## 2017-12-27 ENCOUNTER — Telehealth: Payer: Self-pay | Admitting: Pulmonary Disease

## 2017-12-27 DIAGNOSIS — N2581 Secondary hyperparathyroidism of renal origin: Secondary | ICD-10-CM | POA: Diagnosis not present

## 2017-12-27 DIAGNOSIS — D631 Anemia in chronic kidney disease: Secondary | ICD-10-CM | POA: Diagnosis not present

## 2017-12-27 DIAGNOSIS — G4733 Obstructive sleep apnea (adult) (pediatric): Secondary | ICD-10-CM

## 2017-12-27 DIAGNOSIS — N186 End stage renal disease: Secondary | ICD-10-CM | POA: Diagnosis not present

## 2017-12-27 NOTE — Telephone Encounter (Signed)
Called and spoke with patients sister, patient had HST back in November of 2018. She was requesting the results of this. Per [place] notes it looks like the results were documented but the patient was never called and the titration was never ordered. Apologized to the patients sister and ordered the titration. She will be contacted regarding this.

## 2017-12-28 DIAGNOSIS — R109 Unspecified abdominal pain: Secondary | ICD-10-CM | POA: Diagnosis not present

## 2017-12-28 DIAGNOSIS — D631 Anemia in chronic kidney disease: Secondary | ICD-10-CM | POA: Diagnosis not present

## 2017-12-28 DIAGNOSIS — I120 Hypertensive chronic kidney disease with stage 5 chronic kidney disease or end stage renal disease: Secondary | ICD-10-CM | POA: Diagnosis not present

## 2017-12-28 DIAGNOSIS — Z043 Encounter for examination and observation following other accident: Secondary | ICD-10-CM | POA: Diagnosis not present

## 2017-12-28 DIAGNOSIS — Y999 Unspecified external cause status: Secondary | ICD-10-CM | POA: Diagnosis not present

## 2017-12-28 DIAGNOSIS — Z8659 Personal history of other mental and behavioral disorders: Secondary | ICD-10-CM | POA: Diagnosis not present

## 2017-12-28 DIAGNOSIS — T85611A Breakdown (mechanical) of intraperitoneal dialysis catheter, initial encounter: Secondary | ICD-10-CM | POA: Diagnosis not present

## 2017-12-28 DIAGNOSIS — X58XXXA Exposure to other specified factors, initial encounter: Secondary | ICD-10-CM | POA: Diagnosis not present

## 2017-12-28 DIAGNOSIS — N2581 Secondary hyperparathyroidism of renal origin: Secondary | ICD-10-CM | POA: Diagnosis not present

## 2017-12-28 DIAGNOSIS — M109 Gout, unspecified: Secondary | ICD-10-CM | POA: Diagnosis not present

## 2017-12-28 DIAGNOSIS — E785 Hyperlipidemia, unspecified: Secondary | ICD-10-CM | POA: Diagnosis not present

## 2017-12-28 DIAGNOSIS — N186 End stage renal disease: Secondary | ICD-10-CM | POA: Diagnosis not present

## 2017-12-28 DIAGNOSIS — E871 Hypo-osmolality and hyponatremia: Secondary | ICD-10-CM | POA: Diagnosis not present

## 2017-12-28 DIAGNOSIS — Z992 Dependence on renal dialysis: Secondary | ICD-10-CM | POA: Diagnosis not present

## 2017-12-29 DIAGNOSIS — Z8659 Personal history of other mental and behavioral disorders: Secondary | ICD-10-CM | POA: Diagnosis not present

## 2017-12-29 DIAGNOSIS — N186 End stage renal disease: Secondary | ICD-10-CM | POA: Diagnosis not present

## 2017-12-29 DIAGNOSIS — I120 Hypertensive chronic kidney disease with stage 5 chronic kidney disease or end stage renal disease: Secondary | ICD-10-CM | POA: Diagnosis not present

## 2017-12-29 DIAGNOSIS — N2581 Secondary hyperparathyroidism of renal origin: Secondary | ICD-10-CM | POA: Diagnosis not present

## 2017-12-29 DIAGNOSIS — T85611A Breakdown (mechanical) of intraperitoneal dialysis catheter, initial encounter: Secondary | ICD-10-CM | POA: Diagnosis not present

## 2017-12-29 DIAGNOSIS — E785 Hyperlipidemia, unspecified: Secondary | ICD-10-CM | POA: Diagnosis not present

## 2017-12-29 DIAGNOSIS — Z992 Dependence on renal dialysis: Secondary | ICD-10-CM | POA: Diagnosis not present

## 2017-12-29 DIAGNOSIS — D631 Anemia in chronic kidney disease: Secondary | ICD-10-CM | POA: Diagnosis not present

## 2017-12-29 DIAGNOSIS — M109 Gout, unspecified: Secondary | ICD-10-CM | POA: Diagnosis not present

## 2017-12-29 DIAGNOSIS — Z043 Encounter for examination and observation following other accident: Secondary | ICD-10-CM | POA: Diagnosis not present

## 2017-12-29 DIAGNOSIS — E871 Hypo-osmolality and hyponatremia: Secondary | ICD-10-CM | POA: Diagnosis not present

## 2017-12-30 DIAGNOSIS — D631 Anemia in chronic kidney disease: Secondary | ICD-10-CM | POA: Diagnosis not present

## 2017-12-30 DIAGNOSIS — N186 End stage renal disease: Secondary | ICD-10-CM | POA: Diagnosis not present

## 2017-12-30 DIAGNOSIS — N2581 Secondary hyperparathyroidism of renal origin: Secondary | ICD-10-CM | POA: Diagnosis not present

## 2017-12-31 DIAGNOSIS — N2581 Secondary hyperparathyroidism of renal origin: Secondary | ICD-10-CM | POA: Diagnosis not present

## 2017-12-31 DIAGNOSIS — D631 Anemia in chronic kidney disease: Secondary | ICD-10-CM | POA: Diagnosis not present

## 2017-12-31 DIAGNOSIS — N186 End stage renal disease: Secondary | ICD-10-CM | POA: Diagnosis not present

## 2018-01-01 DIAGNOSIS — D631 Anemia in chronic kidney disease: Secondary | ICD-10-CM | POA: Diagnosis not present

## 2018-01-01 DIAGNOSIS — N186 End stage renal disease: Secondary | ICD-10-CM | POA: Diagnosis not present

## 2018-01-01 DIAGNOSIS — N2581 Secondary hyperparathyroidism of renal origin: Secondary | ICD-10-CM | POA: Diagnosis not present

## 2018-01-02 DIAGNOSIS — N2581 Secondary hyperparathyroidism of renal origin: Secondary | ICD-10-CM | POA: Diagnosis not present

## 2018-01-02 DIAGNOSIS — D631 Anemia in chronic kidney disease: Secondary | ICD-10-CM | POA: Diagnosis not present

## 2018-01-02 DIAGNOSIS — N186 End stage renal disease: Secondary | ICD-10-CM | POA: Diagnosis not present

## 2018-01-03 DIAGNOSIS — D631 Anemia in chronic kidney disease: Secondary | ICD-10-CM | POA: Diagnosis not present

## 2018-01-03 DIAGNOSIS — N2581 Secondary hyperparathyroidism of renal origin: Secondary | ICD-10-CM | POA: Diagnosis not present

## 2018-01-03 DIAGNOSIS — N186 End stage renal disease: Secondary | ICD-10-CM | POA: Diagnosis not present

## 2018-01-04 DIAGNOSIS — N186 End stage renal disease: Secondary | ICD-10-CM | POA: Diagnosis not present

## 2018-01-04 DIAGNOSIS — N2581 Secondary hyperparathyroidism of renal origin: Secondary | ICD-10-CM | POA: Diagnosis not present

## 2018-01-04 DIAGNOSIS — D631 Anemia in chronic kidney disease: Secondary | ICD-10-CM | POA: Diagnosis not present

## 2018-01-05 DIAGNOSIS — N186 End stage renal disease: Secondary | ICD-10-CM | POA: Diagnosis not present

## 2018-01-05 DIAGNOSIS — N2581 Secondary hyperparathyroidism of renal origin: Secondary | ICD-10-CM | POA: Diagnosis not present

## 2018-01-05 DIAGNOSIS — D631 Anemia in chronic kidney disease: Secondary | ICD-10-CM | POA: Diagnosis not present

## 2018-01-06 DIAGNOSIS — N186 End stage renal disease: Secondary | ICD-10-CM | POA: Diagnosis not present

## 2018-01-06 DIAGNOSIS — D631 Anemia in chronic kidney disease: Secondary | ICD-10-CM | POA: Diagnosis not present

## 2018-01-06 DIAGNOSIS — N2581 Secondary hyperparathyroidism of renal origin: Secondary | ICD-10-CM | POA: Diagnosis not present

## 2018-01-07 DIAGNOSIS — D631 Anemia in chronic kidney disease: Secondary | ICD-10-CM | POA: Diagnosis not present

## 2018-01-07 DIAGNOSIS — N186 End stage renal disease: Secondary | ICD-10-CM | POA: Diagnosis not present

## 2018-01-07 DIAGNOSIS — N2581 Secondary hyperparathyroidism of renal origin: Secondary | ICD-10-CM | POA: Diagnosis not present

## 2018-01-08 DIAGNOSIS — D631 Anemia in chronic kidney disease: Secondary | ICD-10-CM | POA: Diagnosis not present

## 2018-01-08 DIAGNOSIS — N2581 Secondary hyperparathyroidism of renal origin: Secondary | ICD-10-CM | POA: Diagnosis not present

## 2018-01-08 DIAGNOSIS — N186 End stage renal disease: Secondary | ICD-10-CM | POA: Diagnosis not present

## 2018-01-09 DIAGNOSIS — N2581 Secondary hyperparathyroidism of renal origin: Secondary | ICD-10-CM | POA: Diagnosis not present

## 2018-01-09 DIAGNOSIS — N186 End stage renal disease: Secondary | ICD-10-CM | POA: Diagnosis not present

## 2018-01-09 DIAGNOSIS — D631 Anemia in chronic kidney disease: Secondary | ICD-10-CM | POA: Diagnosis not present

## 2018-01-10 DIAGNOSIS — D631 Anemia in chronic kidney disease: Secondary | ICD-10-CM | POA: Diagnosis not present

## 2018-01-10 DIAGNOSIS — N2581 Secondary hyperparathyroidism of renal origin: Secondary | ICD-10-CM | POA: Diagnosis not present

## 2018-01-10 DIAGNOSIS — N186 End stage renal disease: Secondary | ICD-10-CM | POA: Diagnosis not present

## 2018-01-10 DIAGNOSIS — Z309 Encounter for contraceptive management, unspecified: Secondary | ICD-10-CM | POA: Diagnosis not present

## 2018-01-11 DIAGNOSIS — D631 Anemia in chronic kidney disease: Secondary | ICD-10-CM | POA: Diagnosis not present

## 2018-01-11 DIAGNOSIS — N186 End stage renal disease: Secondary | ICD-10-CM | POA: Diagnosis not present

## 2018-01-11 DIAGNOSIS — N2581 Secondary hyperparathyroidism of renal origin: Secondary | ICD-10-CM | POA: Diagnosis not present

## 2018-01-12 DIAGNOSIS — N186 End stage renal disease: Secondary | ICD-10-CM | POA: Diagnosis not present

## 2018-01-12 DIAGNOSIS — D631 Anemia in chronic kidney disease: Secondary | ICD-10-CM | POA: Diagnosis not present

## 2018-01-12 DIAGNOSIS — N2581 Secondary hyperparathyroidism of renal origin: Secondary | ICD-10-CM | POA: Diagnosis not present

## 2018-01-13 ENCOUNTER — Ambulatory Visit (HOSPITAL_BASED_OUTPATIENT_CLINIC_OR_DEPARTMENT_OTHER): Payer: Medicare Other | Attending: Pulmonary Disease | Admitting: Pulmonary Disease

## 2018-01-13 VITALS — Ht 66.0 in | Wt 165.0 lb

## 2018-01-13 DIAGNOSIS — G4733 Obstructive sleep apnea (adult) (pediatric): Secondary | ICD-10-CM | POA: Diagnosis not present

## 2018-01-13 DIAGNOSIS — N2581 Secondary hyperparathyroidism of renal origin: Secondary | ICD-10-CM | POA: Diagnosis not present

## 2018-01-13 DIAGNOSIS — Z9989 Dependence on other enabling machines and devices: Secondary | ICD-10-CM

## 2018-01-13 DIAGNOSIS — N186 End stage renal disease: Secondary | ICD-10-CM | POA: Diagnosis not present

## 2018-01-13 DIAGNOSIS — D631 Anemia in chronic kidney disease: Secondary | ICD-10-CM | POA: Diagnosis not present

## 2018-01-14 DIAGNOSIS — N186 End stage renal disease: Secondary | ICD-10-CM | POA: Diagnosis not present

## 2018-01-14 DIAGNOSIS — D631 Anemia in chronic kidney disease: Secondary | ICD-10-CM | POA: Diagnosis not present

## 2018-01-14 DIAGNOSIS — N2581 Secondary hyperparathyroidism of renal origin: Secondary | ICD-10-CM | POA: Diagnosis not present

## 2018-01-15 DIAGNOSIS — D631 Anemia in chronic kidney disease: Secondary | ICD-10-CM | POA: Diagnosis not present

## 2018-01-15 DIAGNOSIS — N186 End stage renal disease: Secondary | ICD-10-CM | POA: Diagnosis not present

## 2018-01-15 DIAGNOSIS — N2581 Secondary hyperparathyroidism of renal origin: Secondary | ICD-10-CM | POA: Diagnosis not present

## 2018-01-16 DIAGNOSIS — N186 End stage renal disease: Secondary | ICD-10-CM | POA: Diagnosis not present

## 2018-01-16 DIAGNOSIS — N2581 Secondary hyperparathyroidism of renal origin: Secondary | ICD-10-CM | POA: Diagnosis not present

## 2018-01-16 DIAGNOSIS — D631 Anemia in chronic kidney disease: Secondary | ICD-10-CM | POA: Diagnosis not present

## 2018-01-17 DIAGNOSIS — N2581 Secondary hyperparathyroidism of renal origin: Secondary | ICD-10-CM | POA: Diagnosis not present

## 2018-01-17 DIAGNOSIS — N186 End stage renal disease: Secondary | ICD-10-CM | POA: Diagnosis not present

## 2018-01-17 DIAGNOSIS — D631 Anemia in chronic kidney disease: Secondary | ICD-10-CM | POA: Diagnosis not present

## 2018-01-18 DIAGNOSIS — N2581 Secondary hyperparathyroidism of renal origin: Secondary | ICD-10-CM | POA: Diagnosis not present

## 2018-01-18 DIAGNOSIS — D631 Anemia in chronic kidney disease: Secondary | ICD-10-CM | POA: Diagnosis not present

## 2018-01-18 DIAGNOSIS — N186 End stage renal disease: Secondary | ICD-10-CM | POA: Diagnosis not present

## 2018-01-19 DIAGNOSIS — D631 Anemia in chronic kidney disease: Secondary | ICD-10-CM | POA: Diagnosis not present

## 2018-01-19 DIAGNOSIS — N2581 Secondary hyperparathyroidism of renal origin: Secondary | ICD-10-CM | POA: Diagnosis not present

## 2018-01-19 DIAGNOSIS — N186 End stage renal disease: Secondary | ICD-10-CM | POA: Diagnosis not present

## 2018-01-20 DIAGNOSIS — D631 Anemia in chronic kidney disease: Secondary | ICD-10-CM | POA: Diagnosis not present

## 2018-01-20 DIAGNOSIS — N186 End stage renal disease: Secondary | ICD-10-CM | POA: Diagnosis not present

## 2018-01-20 DIAGNOSIS — Z992 Dependence on renal dialysis: Secondary | ICD-10-CM | POA: Diagnosis not present

## 2018-01-20 DIAGNOSIS — N2581 Secondary hyperparathyroidism of renal origin: Secondary | ICD-10-CM | POA: Diagnosis not present

## 2018-01-21 ENCOUNTER — Telehealth: Payer: Self-pay | Admitting: Pulmonary Disease

## 2018-01-21 DIAGNOSIS — G4733 Obstructive sleep apnea (adult) (pediatric): Secondary | ICD-10-CM | POA: Diagnosis not present

## 2018-01-21 DIAGNOSIS — D631 Anemia in chronic kidney disease: Secondary | ICD-10-CM | POA: Diagnosis not present

## 2018-01-21 DIAGNOSIS — Z9989 Dependence on other enabling machines and devices: Secondary | ICD-10-CM | POA: Diagnosis not present

## 2018-01-21 DIAGNOSIS — Z4932 Encounter for adequacy testing for peritoneal dialysis: Secondary | ICD-10-CM | POA: Diagnosis not present

## 2018-01-21 DIAGNOSIS — G4730 Sleep apnea, unspecified: Secondary | ICD-10-CM | POA: Diagnosis not present

## 2018-01-21 DIAGNOSIS — N186 End stage renal disease: Secondary | ICD-10-CM | POA: Diagnosis not present

## 2018-01-21 DIAGNOSIS — N2581 Secondary hyperparathyroidism of renal origin: Secondary | ICD-10-CM | POA: Diagnosis not present

## 2018-01-21 NOTE — Telephone Encounter (Signed)
Rx for  autoCPAP 8-15 cm with a Small size Fisher&Paykel Full Face Mask Simplus mask and heated humidification  OV in 6wks with NP/ me

## 2018-01-21 NOTE — Procedures (Signed)
Patient Name: Jane [name], Jane [name] Date: 01/13/2018 Gender: Female D.O.B: Jul 13, 1966 Age (years): 33 Referring Provider: Kara Mead MD, ABSM Height (inches): 66 Interpreting Physician: Kara Mead MD, ABSM Weight (lbs): 165 RPSGT: Jonna Coup BMI: 27 MRN: [id number] Neck Size: 14.00 <br> <br> CLINICAL INFORMATION The patient is referred for a BiPAP titration to treat sleep apnea.  Date of  HST: 08/2017, moderate AHI 20/h  SLEEP STUDY TECHNIQUE As per the AASM Manual for the Scoring of Sleep and Associated Events v2.3 (April 2016) with a hypopnea requiring 4% desaturations.  The channels recorded and monitored were frontal, central and occipital EEG, electrooculogram (EOG), submentalis EMG (chin), nasal and oral airflow, thoracic and abdominal wall motion, anterior tibialis EMG, snore microphone, electrocardiogram, and pulse oximetry. Bilevel positive airway pressure (BPAP) was initiated at the beginning of the study and titrated to treat sleep-disordered breathing.  MEDICATIONS Medications self-administered by patient taken the night of the study : N/A  RESPIRATORY PARAMETERS Optimal IPAP Pressure (cm): 20 AHI at Optimal Pressure (/hr) 0.0 Optimal EPAP Pressure (cm): 16   Overall Minimal O2 (%): 90.0 Minimal O2 at Optimal Pressure (%): 94.0 SLEEP ARCHITECTURE Start Time: 9:47:21 PM Stop Time: 4:32:25 AM Total Time (min): 405.1 Total Sleep Time (min): 367.5 Sleep Latency (min): 21.5 Sleep Efficiency (%): 90.7% REM Latency (min): 85.0 WASO (min): 16.1 Stage N1 (%): 1.4% Stage N2 (%): 63.8% Stage N3 (%): 1.4% Stage R (%): 33.47 Supine (%): 18.50 Arousal Index (/hr): 3.6     CARDIAC DATA The 2 lead EKG demonstrated sinus rhythm. The mean heart rate was 21.8 beats per minute. Other EKG findings include: None.   LEG MOVEMENT DATA The total Periodic Limb Movements of Sleep (PLMS) were 0. The PLMS index was 0.0. A PLMS index of <15 is considered normal in  adults.  IMPRESSIONS - An optimal PAP pressure was selected for this patient ( 20 /16 cm of water). Events seemed controlled on CPAP 13 cm & centrals emerged at higher levels & required bilevel - Mild Central Sleep Apnea was noted during this titration (CAI = 5.4/h). - Significant oxygen desaturations were not observed during this titration (min O2 = 90.0%). - The patient snored with soft snoring volume. - No cardiac abnormalities were observed during this study. - Clinically significant periodic limb movements were not noted during this study. Arousals associated with PLMs were rare.   DIAGNOSIS - Obstructive Sleep Apnea (327.23 [G47.33 ICD-10])   RECOMMENDATIONS - Trial of autoCPAP 8-15 cm with a Small size Fisher&Paykel Full Face Mask Simplus mask and heated humidification. - if this does not work due to centrals, then may need BiPAP therapy on 20/16 cm H2O - Avoid alcohol, sedatives and other CNS depressants that may worsen sleep apnea and disrupt normal sleep architecture. - Sleep hygiene should be reviewed to assess factors that may improve sleep quality. - Weight management and regular exercise should be initiated or continued. - Return to Sleep Center for re-evaluation after 4 weeks of therapy   Kara Mead MD Board Certified in [place]

## 2018-01-22 DIAGNOSIS — D631 Anemia in chronic kidney disease: Secondary | ICD-10-CM | POA: Diagnosis not present

## 2018-01-22 DIAGNOSIS — N2581 Secondary hyperparathyroidism of renal origin: Secondary | ICD-10-CM | POA: Diagnosis not present

## 2018-01-22 DIAGNOSIS — N186 End stage renal disease: Secondary | ICD-10-CM | POA: Diagnosis not present

## 2018-01-22 NOTE — Telephone Encounter (Signed)
Spoke with patient's sister [name]. She is aware of results. She stated that the patient needs a CPAP machine and will be ok with [place] placing the order today. Order has been placed.   Follow up appt has been scheduled for 03/12/18 at 915 with RA. [name] is aware. Nothing else needed at time of call.

## 2018-01-23 DIAGNOSIS — N186 End stage renal disease: Secondary | ICD-10-CM | POA: Diagnosis not present

## 2018-01-23 DIAGNOSIS — N2581 Secondary hyperparathyroidism of renal origin: Secondary | ICD-10-CM | POA: Diagnosis not present

## 2018-01-23 DIAGNOSIS — D631 Anemia in chronic kidney disease: Secondary | ICD-10-CM | POA: Diagnosis not present

## 2018-01-24 DIAGNOSIS — N2581 Secondary hyperparathyroidism of renal origin: Secondary | ICD-10-CM | POA: Diagnosis not present

## 2018-01-24 DIAGNOSIS — D631 Anemia in chronic kidney disease: Secondary | ICD-10-CM | POA: Diagnosis not present

## 2018-01-24 DIAGNOSIS — N186 End stage renal disease: Secondary | ICD-10-CM | POA: Diagnosis not present

## 2018-01-25 DIAGNOSIS — N2581 Secondary hyperparathyroidism of renal origin: Secondary | ICD-10-CM | POA: Diagnosis not present

## 2018-01-25 DIAGNOSIS — D631 Anemia in chronic kidney disease: Secondary | ICD-10-CM | POA: Diagnosis not present

## 2018-01-25 DIAGNOSIS — N186 End stage renal disease: Secondary | ICD-10-CM | POA: Diagnosis not present

## 2018-01-26 DIAGNOSIS — D631 Anemia in chronic kidney disease: Secondary | ICD-10-CM | POA: Diagnosis not present

## 2018-01-26 DIAGNOSIS — N186 End stage renal disease: Secondary | ICD-10-CM | POA: Diagnosis not present

## 2018-01-26 DIAGNOSIS — N2581 Secondary hyperparathyroidism of renal origin: Secondary | ICD-10-CM | POA: Diagnosis not present

## 2018-01-27 DIAGNOSIS — N186 End stage renal disease: Secondary | ICD-10-CM | POA: Diagnosis not present

## 2018-01-27 DIAGNOSIS — D631 Anemia in chronic kidney disease: Secondary | ICD-10-CM | POA: Diagnosis not present

## 2018-01-27 DIAGNOSIS — N2581 Secondary hyperparathyroidism of renal origin: Secondary | ICD-10-CM | POA: Diagnosis not present

## 2018-01-28 DIAGNOSIS — N2581 Secondary hyperparathyroidism of renal origin: Secondary | ICD-10-CM | POA: Diagnosis not present

## 2018-01-28 DIAGNOSIS — N186 End stage renal disease: Secondary | ICD-10-CM | POA: Diagnosis not present

## 2018-01-28 DIAGNOSIS — D631 Anemia in chronic kidney disease: Secondary | ICD-10-CM | POA: Diagnosis not present

## 2018-01-29 DIAGNOSIS — N186 End stage renal disease: Secondary | ICD-10-CM | POA: Diagnosis not present

## 2018-01-29 DIAGNOSIS — D631 Anemia in chronic kidney disease: Secondary | ICD-10-CM | POA: Diagnosis not present

## 2018-01-29 DIAGNOSIS — N2581 Secondary hyperparathyroidism of renal origin: Secondary | ICD-10-CM | POA: Diagnosis not present

## 2018-01-30 DIAGNOSIS — D631 Anemia in chronic kidney disease: Secondary | ICD-10-CM | POA: Diagnosis not present

## 2018-01-30 DIAGNOSIS — N2581 Secondary hyperparathyroidism of renal origin: Secondary | ICD-10-CM | POA: Diagnosis not present

## 2018-01-30 DIAGNOSIS — N186 End stage renal disease: Secondary | ICD-10-CM | POA: Diagnosis not present

## 2018-01-31 DIAGNOSIS — N186 End stage renal disease: Secondary | ICD-10-CM | POA: Diagnosis not present

## 2018-01-31 DIAGNOSIS — D631 Anemia in chronic kidney disease: Secondary | ICD-10-CM | POA: Diagnosis not present

## 2018-01-31 DIAGNOSIS — N2581 Secondary hyperparathyroidism of renal origin: Secondary | ICD-10-CM | POA: Diagnosis not present

## 2018-02-01 DIAGNOSIS — D631 Anemia in chronic kidney disease: Secondary | ICD-10-CM | POA: Diagnosis not present

## 2018-02-01 DIAGNOSIS — N186 End stage renal disease: Secondary | ICD-10-CM | POA: Diagnosis not present

## 2018-02-01 DIAGNOSIS — N2581 Secondary hyperparathyroidism of renal origin: Secondary | ICD-10-CM | POA: Diagnosis not present

## 2018-02-02 DIAGNOSIS — N2581 Secondary hyperparathyroidism of renal origin: Secondary | ICD-10-CM | POA: Diagnosis not present

## 2018-02-02 DIAGNOSIS — N186 End stage renal disease: Secondary | ICD-10-CM | POA: Diagnosis not present

## 2018-02-02 DIAGNOSIS — D631 Anemia in chronic kidney disease: Secondary | ICD-10-CM | POA: Diagnosis not present

## 2018-02-03 DIAGNOSIS — N186 End stage renal disease: Secondary | ICD-10-CM | POA: Diagnosis not present

## 2018-02-03 DIAGNOSIS — D631 Anemia in chronic kidney disease: Secondary | ICD-10-CM | POA: Diagnosis not present

## 2018-02-03 DIAGNOSIS — N2581 Secondary hyperparathyroidism of renal origin: Secondary | ICD-10-CM | POA: Diagnosis not present

## 2018-02-04 DIAGNOSIS — N186 End stage renal disease: Secondary | ICD-10-CM | POA: Diagnosis not present

## 2018-02-04 DIAGNOSIS — D631 Anemia in chronic kidney disease: Secondary | ICD-10-CM | POA: Diagnosis not present

## 2018-02-04 DIAGNOSIS — N2581 Secondary hyperparathyroidism of renal origin: Secondary | ICD-10-CM | POA: Diagnosis not present

## 2018-02-05 DIAGNOSIS — N2581 Secondary hyperparathyroidism of renal origin: Secondary | ICD-10-CM | POA: Diagnosis not present

## 2018-02-05 DIAGNOSIS — D631 Anemia in chronic kidney disease: Secondary | ICD-10-CM | POA: Diagnosis not present

## 2018-02-05 DIAGNOSIS — N186 End stage renal disease: Secondary | ICD-10-CM | POA: Diagnosis not present

## 2018-02-06 DIAGNOSIS — N186 End stage renal disease: Secondary | ICD-10-CM | POA: Diagnosis not present

## 2018-02-06 DIAGNOSIS — D631 Anemia in chronic kidney disease: Secondary | ICD-10-CM | POA: Diagnosis not present

## 2018-02-06 DIAGNOSIS — N2581 Secondary hyperparathyroidism of renal origin: Secondary | ICD-10-CM | POA: Diagnosis not present

## 2018-02-07 DIAGNOSIS — N186 End stage renal disease: Secondary | ICD-10-CM | POA: Diagnosis not present

## 2018-02-07 DIAGNOSIS — N2581 Secondary hyperparathyroidism of renal origin: Secondary | ICD-10-CM | POA: Diagnosis not present

## 2018-02-07 DIAGNOSIS — D631 Anemia in chronic kidney disease: Secondary | ICD-10-CM | POA: Diagnosis not present

## 2018-02-08 DIAGNOSIS — N186 End stage renal disease: Secondary | ICD-10-CM | POA: Diagnosis not present

## 2018-02-08 DIAGNOSIS — D631 Anemia in chronic kidney disease: Secondary | ICD-10-CM | POA: Diagnosis not present

## 2018-02-08 DIAGNOSIS — N2581 Secondary hyperparathyroidism of renal origin: Secondary | ICD-10-CM | POA: Diagnosis not present

## 2018-02-09 DIAGNOSIS — D631 Anemia in chronic kidney disease: Secondary | ICD-10-CM | POA: Diagnosis not present

## 2018-02-09 DIAGNOSIS — N186 End stage renal disease: Secondary | ICD-10-CM | POA: Diagnosis not present

## 2018-02-09 DIAGNOSIS — N2581 Secondary hyperparathyroidism of renal origin: Secondary | ICD-10-CM | POA: Diagnosis not present

## 2018-02-10 DIAGNOSIS — N2581 Secondary hyperparathyroidism of renal origin: Secondary | ICD-10-CM | POA: Diagnosis not present

## 2018-02-10 DIAGNOSIS — N186 End stage renal disease: Secondary | ICD-10-CM | POA: Diagnosis not present

## 2018-02-10 DIAGNOSIS — D631 Anemia in chronic kidney disease: Secondary | ICD-10-CM | POA: Diagnosis not present

## 2018-02-11 DIAGNOSIS — N2581 Secondary hyperparathyroidism of renal origin: Secondary | ICD-10-CM | POA: Diagnosis not present

## 2018-02-11 DIAGNOSIS — N186 End stage renal disease: Secondary | ICD-10-CM | POA: Diagnosis not present

## 2018-02-11 DIAGNOSIS — D631 Anemia in chronic kidney disease: Secondary | ICD-10-CM | POA: Diagnosis not present

## 2018-02-12 DIAGNOSIS — N186 End stage renal disease: Secondary | ICD-10-CM | POA: Diagnosis not present

## 2018-02-12 DIAGNOSIS — D631 Anemia in chronic kidney disease: Secondary | ICD-10-CM | POA: Diagnosis not present

## 2018-02-12 DIAGNOSIS — N2581 Secondary hyperparathyroidism of renal origin: Secondary | ICD-10-CM | POA: Diagnosis not present

## 2018-02-13 DIAGNOSIS — N186 End stage renal disease: Secondary | ICD-10-CM | POA: Diagnosis not present

## 2018-02-13 DIAGNOSIS — N2581 Secondary hyperparathyroidism of renal origin: Secondary | ICD-10-CM | POA: Diagnosis not present

## 2018-02-13 DIAGNOSIS — D631 Anemia in chronic kidney disease: Secondary | ICD-10-CM | POA: Diagnosis not present

## 2018-02-14 DIAGNOSIS — N2581 Secondary hyperparathyroidism of renal origin: Secondary | ICD-10-CM | POA: Diagnosis not present

## 2018-02-14 DIAGNOSIS — D631 Anemia in chronic kidney disease: Secondary | ICD-10-CM | POA: Diagnosis not present

## 2018-02-14 DIAGNOSIS — N186 End stage renal disease: Secondary | ICD-10-CM | POA: Diagnosis not present

## 2018-02-15 DIAGNOSIS — N2581 Secondary hyperparathyroidism of renal origin: Secondary | ICD-10-CM | POA: Diagnosis not present

## 2018-02-15 DIAGNOSIS — D631 Anemia in chronic kidney disease: Secondary | ICD-10-CM | POA: Diagnosis not present

## 2018-02-15 DIAGNOSIS — N186 End stage renal disease: Secondary | ICD-10-CM | POA: Diagnosis not present

## 2018-02-16 DIAGNOSIS — N2581 Secondary hyperparathyroidism of renal origin: Secondary | ICD-10-CM | POA: Diagnosis not present

## 2018-02-16 DIAGNOSIS — D631 Anemia in chronic kidney disease: Secondary | ICD-10-CM | POA: Diagnosis not present

## 2018-02-16 DIAGNOSIS — N186 End stage renal disease: Secondary | ICD-10-CM | POA: Diagnosis not present

## 2018-02-17 DIAGNOSIS — D631 Anemia in chronic kidney disease: Secondary | ICD-10-CM | POA: Diagnosis not present

## 2018-02-17 DIAGNOSIS — N186 End stage renal disease: Secondary | ICD-10-CM | POA: Diagnosis not present

## 2018-02-17 DIAGNOSIS — N2581 Secondary hyperparathyroidism of renal origin: Secondary | ICD-10-CM | POA: Diagnosis not present

## 2018-02-18 DIAGNOSIS — N2581 Secondary hyperparathyroidism of renal origin: Secondary | ICD-10-CM | POA: Diagnosis not present

## 2018-02-18 DIAGNOSIS — D631 Anemia in chronic kidney disease: Secondary | ICD-10-CM | POA: Diagnosis not present

## 2018-02-18 DIAGNOSIS — N186 End stage renal disease: Secondary | ICD-10-CM | POA: Diagnosis not present

## 2018-02-19 DIAGNOSIS — D631 Anemia in chronic kidney disease: Secondary | ICD-10-CM | POA: Diagnosis not present

## 2018-02-19 DIAGNOSIS — N2581 Secondary hyperparathyroidism of renal origin: Secondary | ICD-10-CM | POA: Diagnosis not present

## 2018-02-19 DIAGNOSIS — N186 End stage renal disease: Secondary | ICD-10-CM | POA: Diagnosis not present

## 2018-02-19 DIAGNOSIS — Z992 Dependence on renal dialysis: Secondary | ICD-10-CM | POA: Diagnosis not present

## 2018-02-20 DIAGNOSIS — N186 End stage renal disease: Secondary | ICD-10-CM | POA: Diagnosis not present

## 2018-02-20 DIAGNOSIS — F29 Unspecified psychosis not due to a substance or known physiological condition: Secondary | ICD-10-CM | POA: Diagnosis not present

## 2018-02-20 DIAGNOSIS — N2581 Secondary hyperparathyroidism of renal origin: Secondary | ICD-10-CM | POA: Diagnosis not present

## 2018-02-20 DIAGNOSIS — D631 Anemia in chronic kidney disease: Secondary | ICD-10-CM | POA: Diagnosis not present

## 2018-02-21 DIAGNOSIS — D631 Anemia in chronic kidney disease: Secondary | ICD-10-CM | POA: Diagnosis not present

## 2018-02-21 DIAGNOSIS — N186 End stage renal disease: Secondary | ICD-10-CM | POA: Diagnosis not present

## 2018-02-21 DIAGNOSIS — N2581 Secondary hyperparathyroidism of renal origin: Secondary | ICD-10-CM | POA: Diagnosis not present

## 2018-02-22 DIAGNOSIS — N2581 Secondary hyperparathyroidism of renal origin: Secondary | ICD-10-CM | POA: Diagnosis not present

## 2018-02-22 DIAGNOSIS — D631 Anemia in chronic kidney disease: Secondary | ICD-10-CM | POA: Diagnosis not present

## 2018-02-22 DIAGNOSIS — N186 End stage renal disease: Secondary | ICD-10-CM | POA: Diagnosis not present

## 2018-02-23 DIAGNOSIS — D631 Anemia in chronic kidney disease: Secondary | ICD-10-CM | POA: Diagnosis not present

## 2018-02-23 DIAGNOSIS — N2581 Secondary hyperparathyroidism of renal origin: Secondary | ICD-10-CM | POA: Diagnosis not present

## 2018-02-23 DIAGNOSIS — N186 End stage renal disease: Secondary | ICD-10-CM | POA: Diagnosis not present

## 2018-02-24 DIAGNOSIS — N2581 Secondary hyperparathyroidism of renal origin: Secondary | ICD-10-CM | POA: Diagnosis not present

## 2018-02-24 DIAGNOSIS — D631 Anemia in chronic kidney disease: Secondary | ICD-10-CM | POA: Diagnosis not present

## 2018-02-24 DIAGNOSIS — N186 End stage renal disease: Secondary | ICD-10-CM | POA: Diagnosis not present

## 2018-02-25 DIAGNOSIS — N2581 Secondary hyperparathyroidism of renal origin: Secondary | ICD-10-CM | POA: Diagnosis not present

## 2018-02-25 DIAGNOSIS — N186 End stage renal disease: Secondary | ICD-10-CM | POA: Diagnosis not present

## 2018-02-25 DIAGNOSIS — D631 Anemia in chronic kidney disease: Secondary | ICD-10-CM | POA: Diagnosis not present

## 2018-02-26 DIAGNOSIS — Z114 Encounter for screening for human immunodeficiency virus [HIV]: Secondary | ICD-10-CM | POA: Diagnosis not present

## 2018-02-26 DIAGNOSIS — N186 End stage renal disease: Secondary | ICD-10-CM | POA: Diagnosis not present

## 2018-02-26 DIAGNOSIS — Z1159 Encounter for screening for other viral diseases: Secondary | ICD-10-CM | POA: Diagnosis not present

## 2018-02-26 DIAGNOSIS — D631 Anemia in chronic kidney disease: Secondary | ICD-10-CM | POA: Diagnosis not present

## 2018-02-26 DIAGNOSIS — N2581 Secondary hyperparathyroidism of renal origin: Secondary | ICD-10-CM | POA: Diagnosis not present

## 2018-02-27 DIAGNOSIS — N186 End stage renal disease: Secondary | ICD-10-CM | POA: Diagnosis not present

## 2018-02-27 DIAGNOSIS — N2581 Secondary hyperparathyroidism of renal origin: Secondary | ICD-10-CM | POA: Diagnosis not present

## 2018-02-27 DIAGNOSIS — D631 Anemia in chronic kidney disease: Secondary | ICD-10-CM | POA: Diagnosis not present

## 2018-02-28 DIAGNOSIS — D631 Anemia in chronic kidney disease: Secondary | ICD-10-CM | POA: Diagnosis not present

## 2018-02-28 DIAGNOSIS — N2581 Secondary hyperparathyroidism of renal origin: Secondary | ICD-10-CM | POA: Diagnosis not present

## 2018-02-28 DIAGNOSIS — N186 End stage renal disease: Secondary | ICD-10-CM | POA: Diagnosis not present

## 2018-03-01 DIAGNOSIS — D631 Anemia in chronic kidney disease: Secondary | ICD-10-CM | POA: Diagnosis not present

## 2018-03-01 DIAGNOSIS — N186 End stage renal disease: Secondary | ICD-10-CM | POA: Diagnosis not present

## 2018-03-01 DIAGNOSIS — N2581 Secondary hyperparathyroidism of renal origin: Secondary | ICD-10-CM | POA: Diagnosis not present

## 2018-03-02 DIAGNOSIS — N186 End stage renal disease: Secondary | ICD-10-CM | POA: Diagnosis not present

## 2018-03-02 DIAGNOSIS — N2581 Secondary hyperparathyroidism of renal origin: Secondary | ICD-10-CM | POA: Diagnosis not present

## 2018-03-02 DIAGNOSIS — D631 Anemia in chronic kidney disease: Secondary | ICD-10-CM | POA: Diagnosis not present

## 2018-03-03 DIAGNOSIS — N2581 Secondary hyperparathyroidism of renal origin: Secondary | ICD-10-CM | POA: Diagnosis not present

## 2018-03-03 DIAGNOSIS — D631 Anemia in chronic kidney disease: Secondary | ICD-10-CM | POA: Diagnosis not present

## 2018-03-03 DIAGNOSIS — N186 End stage renal disease: Secondary | ICD-10-CM | POA: Diagnosis not present

## 2018-03-04 DIAGNOSIS — N2581 Secondary hyperparathyroidism of renal origin: Secondary | ICD-10-CM | POA: Diagnosis not present

## 2018-03-04 DIAGNOSIS — N186 End stage renal disease: Secondary | ICD-10-CM | POA: Diagnosis not present

## 2018-03-04 DIAGNOSIS — D631 Anemia in chronic kidney disease: Secondary | ICD-10-CM | POA: Diagnosis not present

## 2018-03-05 DIAGNOSIS — D631 Anemia in chronic kidney disease: Secondary | ICD-10-CM | POA: Diagnosis not present

## 2018-03-05 DIAGNOSIS — N186 End stage renal disease: Secondary | ICD-10-CM | POA: Diagnosis not present

## 2018-03-05 DIAGNOSIS — N2581 Secondary hyperparathyroidism of renal origin: Secondary | ICD-10-CM | POA: Diagnosis not present

## 2018-03-06 DIAGNOSIS — N186 End stage renal disease: Secondary | ICD-10-CM | POA: Diagnosis not present

## 2018-03-06 DIAGNOSIS — N2581 Secondary hyperparathyroidism of renal origin: Secondary | ICD-10-CM | POA: Diagnosis not present

## 2018-03-06 DIAGNOSIS — D631 Anemia in chronic kidney disease: Secondary | ICD-10-CM | POA: Diagnosis not present

## 2018-03-07 DIAGNOSIS — D631 Anemia in chronic kidney disease: Secondary | ICD-10-CM | POA: Diagnosis not present

## 2018-03-07 DIAGNOSIS — N2581 Secondary hyperparathyroidism of renal origin: Secondary | ICD-10-CM | POA: Diagnosis not present

## 2018-03-07 DIAGNOSIS — N186 End stage renal disease: Secondary | ICD-10-CM | POA: Diagnosis not present

## 2018-03-08 DIAGNOSIS — D631 Anemia in chronic kidney disease: Secondary | ICD-10-CM | POA: Diagnosis not present

## 2018-03-08 DIAGNOSIS — N2581 Secondary hyperparathyroidism of renal origin: Secondary | ICD-10-CM | POA: Diagnosis not present

## 2018-03-08 DIAGNOSIS — N186 End stage renal disease: Secondary | ICD-10-CM | POA: Diagnosis not present

## 2018-03-09 DIAGNOSIS — N186 End stage renal disease: Secondary | ICD-10-CM | POA: Diagnosis not present

## 2018-03-09 DIAGNOSIS — N2581 Secondary hyperparathyroidism of renal origin: Secondary | ICD-10-CM | POA: Diagnosis not present

## 2018-03-09 DIAGNOSIS — D631 Anemia in chronic kidney disease: Secondary | ICD-10-CM | POA: Diagnosis not present

## 2018-03-10 DIAGNOSIS — D631 Anemia in chronic kidney disease: Secondary | ICD-10-CM | POA: Diagnosis not present

## 2018-03-10 DIAGNOSIS — N186 End stage renal disease: Secondary | ICD-10-CM | POA: Diagnosis not present

## 2018-03-10 DIAGNOSIS — N2581 Secondary hyperparathyroidism of renal origin: Secondary | ICD-10-CM | POA: Diagnosis not present

## 2018-03-11 DIAGNOSIS — N186 End stage renal disease: Secondary | ICD-10-CM | POA: Diagnosis not present

## 2018-03-11 DIAGNOSIS — N2581 Secondary hyperparathyroidism of renal origin: Secondary | ICD-10-CM | POA: Diagnosis not present

## 2018-03-11 DIAGNOSIS — D631 Anemia in chronic kidney disease: Secondary | ICD-10-CM | POA: Diagnosis not present

## 2018-03-12 ENCOUNTER — Ambulatory Visit: Payer: Medicare Other | Admitting: Pulmonary Disease

## 2018-03-12 DIAGNOSIS — N2581 Secondary hyperparathyroidism of renal origin: Secondary | ICD-10-CM | POA: Diagnosis not present

## 2018-03-12 DIAGNOSIS — D631 Anemia in chronic kidney disease: Secondary | ICD-10-CM | POA: Diagnosis not present

## 2018-03-12 DIAGNOSIS — N186 End stage renal disease: Secondary | ICD-10-CM | POA: Diagnosis not present

## 2018-03-13 DIAGNOSIS — D631 Anemia in chronic kidney disease: Secondary | ICD-10-CM | POA: Diagnosis not present

## 2018-03-13 DIAGNOSIS — N2581 Secondary hyperparathyroidism of renal origin: Secondary | ICD-10-CM | POA: Diagnosis not present

## 2018-03-13 DIAGNOSIS — N186 End stage renal disease: Secondary | ICD-10-CM | POA: Diagnosis not present

## 2018-03-14 DIAGNOSIS — D631 Anemia in chronic kidney disease: Secondary | ICD-10-CM | POA: Diagnosis not present

## 2018-03-14 DIAGNOSIS — N2581 Secondary hyperparathyroidism of renal origin: Secondary | ICD-10-CM | POA: Diagnosis not present

## 2018-03-14 DIAGNOSIS — N186 End stage renal disease: Secondary | ICD-10-CM | POA: Diagnosis not present

## 2018-03-15 DIAGNOSIS — D631 Anemia in chronic kidney disease: Secondary | ICD-10-CM | POA: Diagnosis not present

## 2018-03-15 DIAGNOSIS — N2581 Secondary hyperparathyroidism of renal origin: Secondary | ICD-10-CM | POA: Diagnosis not present

## 2018-03-15 DIAGNOSIS — N186 End stage renal disease: Secondary | ICD-10-CM | POA: Diagnosis not present

## 2018-03-16 DIAGNOSIS — N2581 Secondary hyperparathyroidism of renal origin: Secondary | ICD-10-CM | POA: Diagnosis not present

## 2018-03-16 DIAGNOSIS — N186 End stage renal disease: Secondary | ICD-10-CM | POA: Diagnosis not present

## 2018-03-16 DIAGNOSIS — D631 Anemia in chronic kidney disease: Secondary | ICD-10-CM | POA: Diagnosis not present

## 2018-03-17 DIAGNOSIS — D631 Anemia in chronic kidney disease: Secondary | ICD-10-CM | POA: Diagnosis not present

## 2018-03-17 DIAGNOSIS — N2581 Secondary hyperparathyroidism of renal origin: Secondary | ICD-10-CM | POA: Diagnosis not present

## 2018-03-17 DIAGNOSIS — N186 End stage renal disease: Secondary | ICD-10-CM | POA: Diagnosis not present

## 2018-03-18 DIAGNOSIS — N2581 Secondary hyperparathyroidism of renal origin: Secondary | ICD-10-CM | POA: Diagnosis not present

## 2018-03-18 DIAGNOSIS — D631 Anemia in chronic kidney disease: Secondary | ICD-10-CM | POA: Diagnosis not present

## 2018-03-18 DIAGNOSIS — N186 End stage renal disease: Secondary | ICD-10-CM | POA: Diagnosis not present

## 2018-03-19 DIAGNOSIS — N2581 Secondary hyperparathyroidism of renal origin: Secondary | ICD-10-CM | POA: Diagnosis not present

## 2018-03-19 DIAGNOSIS — N186 End stage renal disease: Secondary | ICD-10-CM | POA: Diagnosis not present

## 2018-03-19 DIAGNOSIS — D631 Anemia in chronic kidney disease: Secondary | ICD-10-CM | POA: Diagnosis not present

## 2018-03-20 DIAGNOSIS — D631 Anemia in chronic kidney disease: Secondary | ICD-10-CM | POA: Diagnosis not present

## 2018-03-20 DIAGNOSIS — N186 End stage renal disease: Secondary | ICD-10-CM | POA: Diagnosis not present

## 2018-03-20 DIAGNOSIS — N2581 Secondary hyperparathyroidism of renal origin: Secondary | ICD-10-CM | POA: Diagnosis not present

## 2018-03-21 DIAGNOSIS — N2581 Secondary hyperparathyroidism of renal origin: Secondary | ICD-10-CM | POA: Diagnosis not present

## 2018-03-21 DIAGNOSIS — N186 End stage renal disease: Secondary | ICD-10-CM | POA: Diagnosis not present

## 2018-03-21 DIAGNOSIS — D631 Anemia in chronic kidney disease: Secondary | ICD-10-CM | POA: Diagnosis not present

## 2018-03-22 DIAGNOSIS — N2581 Secondary hyperparathyroidism of renal origin: Secondary | ICD-10-CM | POA: Diagnosis not present

## 2018-03-22 DIAGNOSIS — N186 End stage renal disease: Secondary | ICD-10-CM | POA: Diagnosis not present

## 2018-03-22 DIAGNOSIS — Z992 Dependence on renal dialysis: Secondary | ICD-10-CM | POA: Diagnosis not present

## 2018-03-22 DIAGNOSIS — D631 Anemia in chronic kidney disease: Secondary | ICD-10-CM | POA: Diagnosis not present

## 2018-03-23 DIAGNOSIS — D631 Anemia in chronic kidney disease: Secondary | ICD-10-CM | POA: Diagnosis not present

## 2018-03-23 DIAGNOSIS — N2581 Secondary hyperparathyroidism of renal origin: Secondary | ICD-10-CM | POA: Diagnosis not present

## 2018-03-23 DIAGNOSIS — N186 End stage renal disease: Secondary | ICD-10-CM | POA: Diagnosis not present

## 2018-03-24 DIAGNOSIS — N186 End stage renal disease: Secondary | ICD-10-CM | POA: Diagnosis not present

## 2018-03-24 DIAGNOSIS — D631 Anemia in chronic kidney disease: Secondary | ICD-10-CM | POA: Diagnosis not present

## 2018-03-24 DIAGNOSIS — N2581 Secondary hyperparathyroidism of renal origin: Secondary | ICD-10-CM | POA: Diagnosis not present

## 2018-03-25 DIAGNOSIS — D631 Anemia in chronic kidney disease: Secondary | ICD-10-CM | POA: Diagnosis not present

## 2018-03-25 DIAGNOSIS — N186 End stage renal disease: Secondary | ICD-10-CM | POA: Diagnosis not present

## 2018-03-25 DIAGNOSIS — N2581 Secondary hyperparathyroidism of renal origin: Secondary | ICD-10-CM | POA: Diagnosis not present

## 2018-03-26 DIAGNOSIS — N2581 Secondary hyperparathyroidism of renal origin: Secondary | ICD-10-CM | POA: Diagnosis not present

## 2018-03-26 DIAGNOSIS — D631 Anemia in chronic kidney disease: Secondary | ICD-10-CM | POA: Diagnosis not present

## 2018-03-26 DIAGNOSIS — N186 End stage renal disease: Secondary | ICD-10-CM | POA: Diagnosis not present

## 2018-03-27 DIAGNOSIS — D631 Anemia in chronic kidney disease: Secondary | ICD-10-CM | POA: Diagnosis not present

## 2018-03-27 DIAGNOSIS — N2581 Secondary hyperparathyroidism of renal origin: Secondary | ICD-10-CM | POA: Diagnosis not present

## 2018-03-27 DIAGNOSIS — N186 End stage renal disease: Secondary | ICD-10-CM | POA: Diagnosis not present

## 2018-03-28 DIAGNOSIS — D631 Anemia in chronic kidney disease: Secondary | ICD-10-CM | POA: Diagnosis not present

## 2018-03-28 DIAGNOSIS — N2581 Secondary hyperparathyroidism of renal origin: Secondary | ICD-10-CM | POA: Diagnosis not present

## 2018-03-28 DIAGNOSIS — N186 End stage renal disease: Secondary | ICD-10-CM | POA: Diagnosis not present

## 2018-03-29 DIAGNOSIS — N2581 Secondary hyperparathyroidism of renal origin: Secondary | ICD-10-CM | POA: Diagnosis not present

## 2018-03-29 DIAGNOSIS — N186 End stage renal disease: Secondary | ICD-10-CM | POA: Diagnosis not present

## 2018-03-29 DIAGNOSIS — D631 Anemia in chronic kidney disease: Secondary | ICD-10-CM | POA: Diagnosis not present

## 2018-03-30 DIAGNOSIS — N2581 Secondary hyperparathyroidism of renal origin: Secondary | ICD-10-CM | POA: Diagnosis not present

## 2018-03-30 DIAGNOSIS — N186 End stage renal disease: Secondary | ICD-10-CM | POA: Diagnosis not present

## 2018-03-30 DIAGNOSIS — D631 Anemia in chronic kidney disease: Secondary | ICD-10-CM | POA: Diagnosis not present

## 2018-03-31 DIAGNOSIS — N186 End stage renal disease: Secondary | ICD-10-CM | POA: Diagnosis not present

## 2018-03-31 DIAGNOSIS — D631 Anemia in chronic kidney disease: Secondary | ICD-10-CM | POA: Diagnosis not present

## 2018-03-31 DIAGNOSIS — N2581 Secondary hyperparathyroidism of renal origin: Secondary | ICD-10-CM | POA: Diagnosis not present

## 2018-04-01 DIAGNOSIS — N186 End stage renal disease: Secondary | ICD-10-CM | POA: Diagnosis not present

## 2018-04-01 DIAGNOSIS — N2581 Secondary hyperparathyroidism of renal origin: Secondary | ICD-10-CM | POA: Diagnosis not present

## 2018-04-01 DIAGNOSIS — D631 Anemia in chronic kidney disease: Secondary | ICD-10-CM | POA: Diagnosis not present

## 2018-04-02 ENCOUNTER — Telehealth: Payer: Self-pay | Admitting: Pulmonary Disease

## 2018-04-02 DIAGNOSIS — D631 Anemia in chronic kidney disease: Secondary | ICD-10-CM | POA: Diagnosis not present

## 2018-04-02 DIAGNOSIS — N186 End stage renal disease: Secondary | ICD-10-CM | POA: Diagnosis not present

## 2018-04-02 DIAGNOSIS — N2581 Secondary hyperparathyroidism of renal origin: Secondary | ICD-10-CM | POA: Diagnosis not present

## 2018-04-02 NOTE — Telephone Encounter (Signed)
Spook with [facility]-states they have attempted to call patient 3-4 times without an answer or call back-03-19-18 note in their computer. They will get her set up with machine asap.    [phone] [facility]  Pt has caregiver- guardian -her sister; that takes care of her healthcare,etc. She is aware that contact [facility] today and get patient set up. Number was given to pts sister. Sister will contact our office for f/u appt with RA once set up. Nothing more needed at this time.

## 2018-04-03 DIAGNOSIS — N186 End stage renal disease: Secondary | ICD-10-CM | POA: Diagnosis not present

## 2018-04-03 DIAGNOSIS — N2581 Secondary hyperparathyroidism of renal origin: Secondary | ICD-10-CM | POA: Diagnosis not present

## 2018-04-03 DIAGNOSIS — D631 Anemia in chronic kidney disease: Secondary | ICD-10-CM | POA: Diagnosis not present

## 2018-04-04 DIAGNOSIS — D631 Anemia in chronic kidney disease: Secondary | ICD-10-CM | POA: Diagnosis not present

## 2018-04-04 DIAGNOSIS — N186 End stage renal disease: Secondary | ICD-10-CM | POA: Diagnosis not present

## 2018-04-04 DIAGNOSIS — N2581 Secondary hyperparathyroidism of renal origin: Secondary | ICD-10-CM | POA: Diagnosis not present

## 2018-04-05 DIAGNOSIS — N186 End stage renal disease: Secondary | ICD-10-CM | POA: Diagnosis not present

## 2018-04-05 DIAGNOSIS — N2581 Secondary hyperparathyroidism of renal origin: Secondary | ICD-10-CM | POA: Diagnosis not present

## 2018-04-05 DIAGNOSIS — D631 Anemia in chronic kidney disease: Secondary | ICD-10-CM | POA: Diagnosis not present

## 2018-04-06 DIAGNOSIS — D631 Anemia in chronic kidney disease: Secondary | ICD-10-CM | POA: Diagnosis not present

## 2018-04-06 DIAGNOSIS — N186 End stage renal disease: Secondary | ICD-10-CM | POA: Diagnosis not present

## 2018-04-06 DIAGNOSIS — N2581 Secondary hyperparathyroidism of renal origin: Secondary | ICD-10-CM | POA: Diagnosis not present

## 2018-04-07 DIAGNOSIS — N2581 Secondary hyperparathyroidism of renal origin: Secondary | ICD-10-CM | POA: Diagnosis not present

## 2018-04-07 DIAGNOSIS — D631 Anemia in chronic kidney disease: Secondary | ICD-10-CM | POA: Diagnosis not present

## 2018-04-07 DIAGNOSIS — N186 End stage renal disease: Secondary | ICD-10-CM | POA: Diagnosis not present

## 2018-04-08 DIAGNOSIS — D631 Anemia in chronic kidney disease: Secondary | ICD-10-CM | POA: Diagnosis not present

## 2018-04-08 DIAGNOSIS — N186 End stage renal disease: Secondary | ICD-10-CM | POA: Diagnosis not present

## 2018-04-08 DIAGNOSIS — N2581 Secondary hyperparathyroidism of renal origin: Secondary | ICD-10-CM | POA: Diagnosis not present

## 2018-04-09 DIAGNOSIS — D631 Anemia in chronic kidney disease: Secondary | ICD-10-CM | POA: Diagnosis not present

## 2018-04-09 DIAGNOSIS — N2581 Secondary hyperparathyroidism of renal origin: Secondary | ICD-10-CM | POA: Diagnosis not present

## 2018-04-09 DIAGNOSIS — N186 End stage renal disease: Secondary | ICD-10-CM | POA: Diagnosis not present

## 2018-04-10 DIAGNOSIS — D631 Anemia in chronic kidney disease: Secondary | ICD-10-CM | POA: Diagnosis not present

## 2018-04-10 DIAGNOSIS — N2581 Secondary hyperparathyroidism of renal origin: Secondary | ICD-10-CM | POA: Diagnosis not present

## 2018-04-10 DIAGNOSIS — N186 End stage renal disease: Secondary | ICD-10-CM | POA: Diagnosis not present

## 2018-04-11 DIAGNOSIS — N2581 Secondary hyperparathyroidism of renal origin: Secondary | ICD-10-CM | POA: Diagnosis not present

## 2018-04-11 DIAGNOSIS — D631 Anemia in chronic kidney disease: Secondary | ICD-10-CM | POA: Diagnosis not present

## 2018-04-11 DIAGNOSIS — N186 End stage renal disease: Secondary | ICD-10-CM | POA: Diagnosis not present

## 2018-04-12 DIAGNOSIS — N186 End stage renal disease: Secondary | ICD-10-CM | POA: Diagnosis not present

## 2018-04-12 DIAGNOSIS — N2581 Secondary hyperparathyroidism of renal origin: Secondary | ICD-10-CM | POA: Diagnosis not present

## 2018-04-12 DIAGNOSIS — D631 Anemia in chronic kidney disease: Secondary | ICD-10-CM | POA: Diagnosis not present

## 2018-04-13 DIAGNOSIS — N186 End stage renal disease: Secondary | ICD-10-CM | POA: Diagnosis not present

## 2018-04-13 DIAGNOSIS — D631 Anemia in chronic kidney disease: Secondary | ICD-10-CM | POA: Diagnosis not present

## 2018-04-13 DIAGNOSIS — N2581 Secondary hyperparathyroidism of renal origin: Secondary | ICD-10-CM | POA: Diagnosis not present

## 2018-04-14 DIAGNOSIS — N186 End stage renal disease: Secondary | ICD-10-CM | POA: Diagnosis not present

## 2018-04-14 DIAGNOSIS — N2581 Secondary hyperparathyroidism of renal origin: Secondary | ICD-10-CM | POA: Diagnosis not present

## 2018-04-14 DIAGNOSIS — D631 Anemia in chronic kidney disease: Secondary | ICD-10-CM | POA: Diagnosis not present

## 2018-04-15 DIAGNOSIS — E039 Hypothyroidism, unspecified: Secondary | ICD-10-CM | POA: Diagnosis not present

## 2018-04-15 DIAGNOSIS — Z9189 Other specified personal risk factors, not elsewhere classified: Secondary | ICD-10-CM | POA: Diagnosis not present

## 2018-04-15 DIAGNOSIS — N2581 Secondary hyperparathyroidism of renal origin: Secondary | ICD-10-CM | POA: Diagnosis not present

## 2018-04-15 DIAGNOSIS — E785 Hyperlipidemia, unspecified: Secondary | ICD-10-CM | POA: Diagnosis not present

## 2018-04-15 DIAGNOSIS — D539 Nutritional anemia, unspecified: Secondary | ICD-10-CM | POA: Diagnosis not present

## 2018-04-15 DIAGNOSIS — N926 Irregular menstruation, unspecified: Secondary | ICD-10-CM | POA: Diagnosis not present

## 2018-04-15 DIAGNOSIS — R569 Unspecified convulsions: Secondary | ICD-10-CM | POA: Diagnosis not present

## 2018-04-15 DIAGNOSIS — Z309 Encounter for contraceptive management, unspecified: Secondary | ICD-10-CM | POA: Diagnosis not present

## 2018-04-15 DIAGNOSIS — D631 Anemia in chronic kidney disease: Secondary | ICD-10-CM | POA: Diagnosis not present

## 2018-04-15 DIAGNOSIS — N186 End stage renal disease: Secondary | ICD-10-CM | POA: Diagnosis not present

## 2018-04-16 DIAGNOSIS — D631 Anemia in chronic kidney disease: Secondary | ICD-10-CM | POA: Diagnosis not present

## 2018-04-16 DIAGNOSIS — N2581 Secondary hyperparathyroidism of renal origin: Secondary | ICD-10-CM | POA: Diagnosis not present

## 2018-04-16 DIAGNOSIS — N186 End stage renal disease: Secondary | ICD-10-CM | POA: Diagnosis not present

## 2018-04-17 DIAGNOSIS — D631 Anemia in chronic kidney disease: Secondary | ICD-10-CM | POA: Diagnosis not present

## 2018-04-17 DIAGNOSIS — N186 End stage renal disease: Secondary | ICD-10-CM | POA: Diagnosis not present

## 2018-04-17 DIAGNOSIS — N2581 Secondary hyperparathyroidism of renal origin: Secondary | ICD-10-CM | POA: Diagnosis not present

## 2018-04-18 DIAGNOSIS — D631 Anemia in chronic kidney disease: Secondary | ICD-10-CM | POA: Diagnosis not present

## 2018-04-18 DIAGNOSIS — N2581 Secondary hyperparathyroidism of renal origin: Secondary | ICD-10-CM | POA: Diagnosis not present

## 2018-04-18 DIAGNOSIS — N186 End stage renal disease: Secondary | ICD-10-CM | POA: Diagnosis not present

## 2018-04-19 DIAGNOSIS — K5900 Constipation, unspecified: Secondary | ICD-10-CM | POA: Diagnosis not present

## 2018-04-19 DIAGNOSIS — N189 Chronic kidney disease, unspecified: Secondary | ICD-10-CM | POA: Diagnosis not present

## 2018-04-19 DIAGNOSIS — N186 End stage renal disease: Secondary | ICD-10-CM | POA: Diagnosis not present

## 2018-04-19 DIAGNOSIS — N2581 Secondary hyperparathyroidism of renal origin: Secondary | ICD-10-CM | POA: Diagnosis not present

## 2018-04-19 DIAGNOSIS — R195 Other fecal abnormalities: Secondary | ICD-10-CM | POA: Diagnosis not present

## 2018-04-19 DIAGNOSIS — R1013 Epigastric pain: Secondary | ICD-10-CM | POA: Diagnosis not present

## 2018-04-19 DIAGNOSIS — Z86010 Personal history of colonic polyps: Secondary | ICD-10-CM | POA: Diagnosis not present

## 2018-04-19 DIAGNOSIS — D631 Anemia in chronic kidney disease: Secondary | ICD-10-CM | POA: Diagnosis not present

## 2018-04-19 DIAGNOSIS — D509 Iron deficiency anemia, unspecified: Secondary | ICD-10-CM | POA: Diagnosis not present

## 2018-04-20 DIAGNOSIS — N2581 Secondary hyperparathyroidism of renal origin: Secondary | ICD-10-CM | POA: Diagnosis not present

## 2018-04-20 DIAGNOSIS — D631 Anemia in chronic kidney disease: Secondary | ICD-10-CM | POA: Diagnosis not present

## 2018-04-20 DIAGNOSIS — N186 End stage renal disease: Secondary | ICD-10-CM | POA: Diagnosis not present

## 2018-04-21 DIAGNOSIS — Z992 Dependence on renal dialysis: Secondary | ICD-10-CM | POA: Diagnosis not present

## 2018-04-21 DIAGNOSIS — D631 Anemia in chronic kidney disease: Secondary | ICD-10-CM | POA: Diagnosis not present

## 2018-04-21 DIAGNOSIS — N186 End stage renal disease: Secondary | ICD-10-CM | POA: Diagnosis not present

## 2018-04-21 DIAGNOSIS — N2581 Secondary hyperparathyroidism of renal origin: Secondary | ICD-10-CM | POA: Diagnosis not present

## 2018-04-22 DIAGNOSIS — D631 Anemia in chronic kidney disease: Secondary | ICD-10-CM | POA: Diagnosis not present

## 2018-04-22 DIAGNOSIS — N2581 Secondary hyperparathyroidism of renal origin: Secondary | ICD-10-CM | POA: Diagnosis not present

## 2018-04-22 DIAGNOSIS — N186 End stage renal disease: Secondary | ICD-10-CM | POA: Diagnosis not present

## 2018-04-23 DIAGNOSIS — N2581 Secondary hyperparathyroidism of renal origin: Secondary | ICD-10-CM | POA: Diagnosis not present

## 2018-04-23 DIAGNOSIS — N186 End stage renal disease: Secondary | ICD-10-CM | POA: Diagnosis not present

## 2018-04-23 DIAGNOSIS — D631 Anemia in chronic kidney disease: Secondary | ICD-10-CM | POA: Diagnosis not present

## 2018-04-24 DIAGNOSIS — N2581 Secondary hyperparathyroidism of renal origin: Secondary | ICD-10-CM | POA: Diagnosis not present

## 2018-04-24 DIAGNOSIS — N186 End stage renal disease: Secondary | ICD-10-CM | POA: Diagnosis not present

## 2018-04-24 DIAGNOSIS — D631 Anemia in chronic kidney disease: Secondary | ICD-10-CM | POA: Diagnosis not present

## 2018-04-25 DIAGNOSIS — N2581 Secondary hyperparathyroidism of renal origin: Secondary | ICD-10-CM | POA: Diagnosis not present

## 2018-04-25 DIAGNOSIS — D631 Anemia in chronic kidney disease: Secondary | ICD-10-CM | POA: Diagnosis not present

## 2018-04-25 DIAGNOSIS — N186 End stage renal disease: Secondary | ICD-10-CM | POA: Diagnosis not present

## 2018-04-26 DIAGNOSIS — N186 End stage renal disease: Secondary | ICD-10-CM | POA: Diagnosis not present

## 2018-04-26 DIAGNOSIS — D631 Anemia in chronic kidney disease: Secondary | ICD-10-CM | POA: Diagnosis not present

## 2018-04-26 DIAGNOSIS — N2581 Secondary hyperparathyroidism of renal origin: Secondary | ICD-10-CM | POA: Diagnosis not present

## 2018-04-27 DIAGNOSIS — N186 End stage renal disease: Secondary | ICD-10-CM | POA: Diagnosis not present

## 2018-04-27 DIAGNOSIS — D631 Anemia in chronic kidney disease: Secondary | ICD-10-CM | POA: Diagnosis not present

## 2018-04-27 DIAGNOSIS — N2581 Secondary hyperparathyroidism of renal origin: Secondary | ICD-10-CM | POA: Diagnosis not present

## 2018-04-28 DIAGNOSIS — N2581 Secondary hyperparathyroidism of renal origin: Secondary | ICD-10-CM | POA: Diagnosis not present

## 2018-04-28 DIAGNOSIS — N186 End stage renal disease: Secondary | ICD-10-CM | POA: Diagnosis not present

## 2018-04-28 DIAGNOSIS — D631 Anemia in chronic kidney disease: Secondary | ICD-10-CM | POA: Diagnosis not present

## 2018-04-29 DIAGNOSIS — N186 End stage renal disease: Secondary | ICD-10-CM | POA: Diagnosis not present

## 2018-04-29 DIAGNOSIS — N2581 Secondary hyperparathyroidism of renal origin: Secondary | ICD-10-CM | POA: Diagnosis not present

## 2018-04-29 DIAGNOSIS — D631 Anemia in chronic kidney disease: Secondary | ICD-10-CM | POA: Diagnosis not present

## 2018-04-30 DIAGNOSIS — D631 Anemia in chronic kidney disease: Secondary | ICD-10-CM | POA: Diagnosis not present

## 2018-04-30 DIAGNOSIS — N186 End stage renal disease: Secondary | ICD-10-CM | POA: Diagnosis not present

## 2018-04-30 DIAGNOSIS — N2581 Secondary hyperparathyroidism of renal origin: Secondary | ICD-10-CM | POA: Diagnosis not present

## 2018-05-01 DIAGNOSIS — N186 End stage renal disease: Secondary | ICD-10-CM | POA: Diagnosis not present

## 2018-05-01 DIAGNOSIS — D631 Anemia in chronic kidney disease: Secondary | ICD-10-CM | POA: Diagnosis not present

## 2018-05-01 DIAGNOSIS — N2581 Secondary hyperparathyroidism of renal origin: Secondary | ICD-10-CM | POA: Diagnosis not present

## 2018-05-02 DIAGNOSIS — N2581 Secondary hyperparathyroidism of renal origin: Secondary | ICD-10-CM | POA: Diagnosis not present

## 2018-05-02 DIAGNOSIS — D631 Anemia in chronic kidney disease: Secondary | ICD-10-CM | POA: Diagnosis not present

## 2018-05-02 DIAGNOSIS — N186 End stage renal disease: Secondary | ICD-10-CM | POA: Diagnosis not present

## 2018-05-03 DIAGNOSIS — N2581 Secondary hyperparathyroidism of renal origin: Secondary | ICD-10-CM | POA: Diagnosis not present

## 2018-05-03 DIAGNOSIS — D631 Anemia in chronic kidney disease: Secondary | ICD-10-CM | POA: Diagnosis not present

## 2018-05-03 DIAGNOSIS — N186 End stage renal disease: Secondary | ICD-10-CM | POA: Diagnosis not present

## 2018-05-04 DIAGNOSIS — N186 End stage renal disease: Secondary | ICD-10-CM | POA: Diagnosis not present

## 2018-05-04 DIAGNOSIS — D631 Anemia in chronic kidney disease: Secondary | ICD-10-CM | POA: Diagnosis not present

## 2018-05-04 DIAGNOSIS — N2581 Secondary hyperparathyroidism of renal origin: Secondary | ICD-10-CM | POA: Diagnosis not present

## 2018-05-05 DIAGNOSIS — N186 End stage renal disease: Secondary | ICD-10-CM | POA: Diagnosis not present

## 2018-05-05 DIAGNOSIS — N2581 Secondary hyperparathyroidism of renal origin: Secondary | ICD-10-CM | POA: Diagnosis not present

## 2018-05-05 DIAGNOSIS — D631 Anemia in chronic kidney disease: Secondary | ICD-10-CM | POA: Diagnosis not present

## 2018-05-06 DIAGNOSIS — Z4932 Encounter for adequacy testing for peritoneal dialysis: Secondary | ICD-10-CM | POA: Diagnosis not present

## 2018-05-06 DIAGNOSIS — D631 Anemia in chronic kidney disease: Secondary | ICD-10-CM | POA: Diagnosis not present

## 2018-05-06 DIAGNOSIS — N186 End stage renal disease: Secondary | ICD-10-CM | POA: Diagnosis not present

## 2018-05-06 DIAGNOSIS — N2581 Secondary hyperparathyroidism of renal origin: Secondary | ICD-10-CM | POA: Diagnosis not present

## 2018-05-07 DIAGNOSIS — N2581 Secondary hyperparathyroidism of renal origin: Secondary | ICD-10-CM | POA: Diagnosis not present

## 2018-05-07 DIAGNOSIS — D631 Anemia in chronic kidney disease: Secondary | ICD-10-CM | POA: Diagnosis not present

## 2018-05-07 DIAGNOSIS — N186 End stage renal disease: Secondary | ICD-10-CM | POA: Diagnosis not present

## 2018-05-08 DIAGNOSIS — N186 End stage renal disease: Secondary | ICD-10-CM | POA: Diagnosis not present

## 2018-05-08 DIAGNOSIS — N2581 Secondary hyperparathyroidism of renal origin: Secondary | ICD-10-CM | POA: Diagnosis not present

## 2018-05-08 DIAGNOSIS — D631 Anemia in chronic kidney disease: Secondary | ICD-10-CM | POA: Diagnosis not present

## 2018-05-09 DIAGNOSIS — N2581 Secondary hyperparathyroidism of renal origin: Secondary | ICD-10-CM | POA: Diagnosis not present

## 2018-05-09 DIAGNOSIS — N186 End stage renal disease: Secondary | ICD-10-CM | POA: Diagnosis not present

## 2018-05-09 DIAGNOSIS — D631 Anemia in chronic kidney disease: Secondary | ICD-10-CM | POA: Diagnosis not present

## 2018-05-10 DIAGNOSIS — N186 End stage renal disease: Secondary | ICD-10-CM | POA: Diagnosis not present

## 2018-05-10 DIAGNOSIS — D631 Anemia in chronic kidney disease: Secondary | ICD-10-CM | POA: Diagnosis not present

## 2018-05-10 DIAGNOSIS — N2581 Secondary hyperparathyroidism of renal origin: Secondary | ICD-10-CM | POA: Diagnosis not present

## 2018-05-11 DIAGNOSIS — N2581 Secondary hyperparathyroidism of renal origin: Secondary | ICD-10-CM | POA: Diagnosis not present

## 2018-05-11 DIAGNOSIS — N186 End stage renal disease: Secondary | ICD-10-CM | POA: Diagnosis not present

## 2018-05-11 DIAGNOSIS — D631 Anemia in chronic kidney disease: Secondary | ICD-10-CM | POA: Diagnosis not present

## 2018-05-12 DIAGNOSIS — N2581 Secondary hyperparathyroidism of renal origin: Secondary | ICD-10-CM | POA: Diagnosis not present

## 2018-05-12 DIAGNOSIS — D631 Anemia in chronic kidney disease: Secondary | ICD-10-CM | POA: Diagnosis not present

## 2018-05-12 DIAGNOSIS — N186 End stage renal disease: Secondary | ICD-10-CM | POA: Diagnosis not present

## 2018-05-13 DIAGNOSIS — D631 Anemia in chronic kidney disease: Secondary | ICD-10-CM | POA: Diagnosis not present

## 2018-05-13 DIAGNOSIS — N2581 Secondary hyperparathyroidism of renal origin: Secondary | ICD-10-CM | POA: Diagnosis not present

## 2018-05-13 DIAGNOSIS — Z4932 Encounter for adequacy testing for peritoneal dialysis: Secondary | ICD-10-CM | POA: Diagnosis not present

## 2018-05-13 DIAGNOSIS — N186 End stage renal disease: Secondary | ICD-10-CM | POA: Diagnosis not present

## 2018-05-14 DIAGNOSIS — F71 Moderate intellectual disabilities: Secondary | ICD-10-CM | POA: Diagnosis not present

## 2018-05-14 DIAGNOSIS — N2581 Secondary hyperparathyroidism of renal origin: Secondary | ICD-10-CM | POA: Diagnosis not present

## 2018-05-14 DIAGNOSIS — N186 End stage renal disease: Secondary | ICD-10-CM | POA: Diagnosis not present

## 2018-05-14 DIAGNOSIS — F29 Unspecified psychosis not due to a substance or known physiological condition: Secondary | ICD-10-CM | POA: Diagnosis not present

## 2018-05-14 DIAGNOSIS — D631 Anemia in chronic kidney disease: Secondary | ICD-10-CM | POA: Diagnosis not present

## 2018-05-15 DIAGNOSIS — N2581 Secondary hyperparathyroidism of renal origin: Secondary | ICD-10-CM | POA: Diagnosis not present

## 2018-05-15 DIAGNOSIS — D631 Anemia in chronic kidney disease: Secondary | ICD-10-CM | POA: Diagnosis not present

## 2018-05-15 DIAGNOSIS — N186 End stage renal disease: Secondary | ICD-10-CM | POA: Diagnosis not present

## 2018-05-16 DIAGNOSIS — N2581 Secondary hyperparathyroidism of renal origin: Secondary | ICD-10-CM | POA: Diagnosis not present

## 2018-05-16 DIAGNOSIS — N186 End stage renal disease: Secondary | ICD-10-CM | POA: Diagnosis not present

## 2018-05-16 DIAGNOSIS — D631 Anemia in chronic kidney disease: Secondary | ICD-10-CM | POA: Diagnosis not present

## 2018-05-17 DIAGNOSIS — N2581 Secondary hyperparathyroidism of renal origin: Secondary | ICD-10-CM | POA: Diagnosis not present

## 2018-05-17 DIAGNOSIS — N186 End stage renal disease: Secondary | ICD-10-CM | POA: Diagnosis not present

## 2018-05-17 DIAGNOSIS — D631 Anemia in chronic kidney disease: Secondary | ICD-10-CM | POA: Diagnosis not present

## 2018-05-18 DIAGNOSIS — D631 Anemia in chronic kidney disease: Secondary | ICD-10-CM | POA: Diagnosis not present

## 2018-05-18 DIAGNOSIS — N186 End stage renal disease: Secondary | ICD-10-CM | POA: Diagnosis not present

## 2018-05-18 DIAGNOSIS — N2581 Secondary hyperparathyroidism of renal origin: Secondary | ICD-10-CM | POA: Diagnosis not present

## 2018-05-19 DIAGNOSIS — N2581 Secondary hyperparathyroidism of renal origin: Secondary | ICD-10-CM | POA: Diagnosis not present

## 2018-05-19 DIAGNOSIS — N186 End stage renal disease: Secondary | ICD-10-CM | POA: Diagnosis not present

## 2018-05-19 DIAGNOSIS — D631 Anemia in chronic kidney disease: Secondary | ICD-10-CM | POA: Diagnosis not present

## 2018-05-20 DIAGNOSIS — N186 End stage renal disease: Secondary | ICD-10-CM | POA: Diagnosis not present

## 2018-05-20 DIAGNOSIS — D631 Anemia in chronic kidney disease: Secondary | ICD-10-CM | POA: Diagnosis not present

## 2018-05-20 DIAGNOSIS — N2581 Secondary hyperparathyroidism of renal origin: Secondary | ICD-10-CM | POA: Diagnosis not present

## 2018-05-21 DIAGNOSIS — N186 End stage renal disease: Secondary | ICD-10-CM | POA: Diagnosis not present

## 2018-05-21 DIAGNOSIS — D631 Anemia in chronic kidney disease: Secondary | ICD-10-CM | POA: Diagnosis not present

## 2018-05-21 DIAGNOSIS — N2581 Secondary hyperparathyroidism of renal origin: Secondary | ICD-10-CM | POA: Diagnosis not present

## 2018-05-22 DIAGNOSIS — I517 Cardiomegaly: Secondary | ICD-10-CM | POA: Diagnosis not present

## 2018-05-22 DIAGNOSIS — N281 Cyst of kidney, acquired: Secondary | ICD-10-CM | POA: Diagnosis not present

## 2018-05-22 DIAGNOSIS — D631 Anemia in chronic kidney disease: Secondary | ICD-10-CM | POA: Diagnosis not present

## 2018-05-22 DIAGNOSIS — Z01818 Encounter for other preprocedural examination: Secondary | ICD-10-CM | POA: Diagnosis not present

## 2018-05-22 DIAGNOSIS — N2581 Secondary hyperparathyroidism of renal origin: Secondary | ICD-10-CM | POA: Diagnosis not present

## 2018-05-22 DIAGNOSIS — I120 Hypertensive chronic kidney disease with stage 5 chronic kidney disease or end stage renal disease: Secondary | ICD-10-CM | POA: Diagnosis not present

## 2018-05-22 DIAGNOSIS — N186 End stage renal disease: Secondary | ICD-10-CM | POA: Diagnosis not present

## 2018-05-22 DIAGNOSIS — Z992 Dependence on renal dialysis: Secondary | ICD-10-CM | POA: Diagnosis not present

## 2018-05-22 DIAGNOSIS — I129 Hypertensive chronic kidney disease with stage 1 through stage 4 chronic kidney disease, or unspecified chronic kidney disease: Secondary | ICD-10-CM | POA: Diagnosis not present

## 2018-05-22 DIAGNOSIS — Z01810 Encounter for preprocedural cardiovascular examination: Secondary | ICD-10-CM | POA: Diagnosis not present

## 2018-05-23 DIAGNOSIS — N2581 Secondary hyperparathyroidism of renal origin: Secondary | ICD-10-CM | POA: Diagnosis not present

## 2018-05-23 DIAGNOSIS — D631 Anemia in chronic kidney disease: Secondary | ICD-10-CM | POA: Diagnosis not present

## 2018-05-23 DIAGNOSIS — N186 End stage renal disease: Secondary | ICD-10-CM | POA: Diagnosis not present

## 2018-05-23 DIAGNOSIS — I120 Hypertensive chronic kidney disease with stage 5 chronic kidney disease or end stage renal disease: Secondary | ICD-10-CM | POA: Diagnosis not present

## 2018-05-23 DIAGNOSIS — Z01818 Encounter for other preprocedural examination: Secondary | ICD-10-CM | POA: Diagnosis not present

## 2018-05-24 DIAGNOSIS — N186 End stage renal disease: Secondary | ICD-10-CM | POA: Diagnosis not present

## 2018-05-24 DIAGNOSIS — D631 Anemia in chronic kidney disease: Secondary | ICD-10-CM | POA: Diagnosis not present

## 2018-05-24 DIAGNOSIS — N2581 Secondary hyperparathyroidism of renal origin: Secondary | ICD-10-CM | POA: Diagnosis not present

## 2018-05-25 DIAGNOSIS — N2581 Secondary hyperparathyroidism of renal origin: Secondary | ICD-10-CM | POA: Diagnosis not present

## 2018-05-25 DIAGNOSIS — D631 Anemia in chronic kidney disease: Secondary | ICD-10-CM | POA: Diagnosis not present

## 2018-05-25 DIAGNOSIS — N186 End stage renal disease: Secondary | ICD-10-CM | POA: Diagnosis not present

## 2018-05-26 DIAGNOSIS — N2581 Secondary hyperparathyroidism of renal origin: Secondary | ICD-10-CM | POA: Diagnosis not present

## 2018-05-26 DIAGNOSIS — D631 Anemia in chronic kidney disease: Secondary | ICD-10-CM | POA: Diagnosis not present

## 2018-05-26 DIAGNOSIS — N186 End stage renal disease: Secondary | ICD-10-CM | POA: Diagnosis not present

## 2018-05-27 DIAGNOSIS — N2581 Secondary hyperparathyroidism of renal origin: Secondary | ICD-10-CM | POA: Diagnosis not present

## 2018-05-27 DIAGNOSIS — N186 End stage renal disease: Secondary | ICD-10-CM | POA: Diagnosis not present

## 2018-05-27 DIAGNOSIS — D631 Anemia in chronic kidney disease: Secondary | ICD-10-CM | POA: Diagnosis not present

## 2018-05-28 DIAGNOSIS — N2581 Secondary hyperparathyroidism of renal origin: Secondary | ICD-10-CM | POA: Diagnosis not present

## 2018-05-28 DIAGNOSIS — N186 End stage renal disease: Secondary | ICD-10-CM | POA: Diagnosis not present

## 2018-05-28 DIAGNOSIS — D631 Anemia in chronic kidney disease: Secondary | ICD-10-CM | POA: Diagnosis not present

## 2018-05-29 DIAGNOSIS — N2581 Secondary hyperparathyroidism of renal origin: Secondary | ICD-10-CM | POA: Diagnosis not present

## 2018-05-29 DIAGNOSIS — D631 Anemia in chronic kidney disease: Secondary | ICD-10-CM | POA: Diagnosis not present

## 2018-05-29 DIAGNOSIS — N186 End stage renal disease: Secondary | ICD-10-CM | POA: Diagnosis not present

## 2018-05-30 DIAGNOSIS — N2581 Secondary hyperparathyroidism of renal origin: Secondary | ICD-10-CM | POA: Diagnosis not present

## 2018-05-30 DIAGNOSIS — N186 End stage renal disease: Secondary | ICD-10-CM | POA: Diagnosis not present

## 2018-05-30 DIAGNOSIS — D631 Anemia in chronic kidney disease: Secondary | ICD-10-CM | POA: Diagnosis not present

## 2018-05-31 DIAGNOSIS — N2581 Secondary hyperparathyroidism of renal origin: Secondary | ICD-10-CM | POA: Diagnosis not present

## 2018-05-31 DIAGNOSIS — D631 Anemia in chronic kidney disease: Secondary | ICD-10-CM | POA: Diagnosis not present

## 2018-05-31 DIAGNOSIS — N186 End stage renal disease: Secondary | ICD-10-CM | POA: Diagnosis not present

## 2018-06-01 DIAGNOSIS — N2581 Secondary hyperparathyroidism of renal origin: Secondary | ICD-10-CM | POA: Diagnosis not present

## 2018-06-01 DIAGNOSIS — N186 End stage renal disease: Secondary | ICD-10-CM | POA: Diagnosis not present

## 2018-06-01 DIAGNOSIS — D631 Anemia in chronic kidney disease: Secondary | ICD-10-CM | POA: Diagnosis not present

## 2018-06-02 DIAGNOSIS — D631 Anemia in chronic kidney disease: Secondary | ICD-10-CM | POA: Diagnosis not present

## 2018-06-02 DIAGNOSIS — N2581 Secondary hyperparathyroidism of renal origin: Secondary | ICD-10-CM | POA: Diagnosis not present

## 2018-06-02 DIAGNOSIS — N186 End stage renal disease: Secondary | ICD-10-CM | POA: Diagnosis not present

## 2018-06-03 DIAGNOSIS — N186 End stage renal disease: Secondary | ICD-10-CM | POA: Diagnosis not present

## 2018-06-03 DIAGNOSIS — D631 Anemia in chronic kidney disease: Secondary | ICD-10-CM | POA: Diagnosis not present

## 2018-06-03 DIAGNOSIS — N2581 Secondary hyperparathyroidism of renal origin: Secondary | ICD-10-CM | POA: Diagnosis not present

## 2018-06-04 DIAGNOSIS — N186 End stage renal disease: Secondary | ICD-10-CM | POA: Diagnosis not present

## 2018-06-04 DIAGNOSIS — N2581 Secondary hyperparathyroidism of renal origin: Secondary | ICD-10-CM | POA: Diagnosis not present

## 2018-06-04 DIAGNOSIS — D631 Anemia in chronic kidney disease: Secondary | ICD-10-CM | POA: Diagnosis not present

## 2018-06-05 DIAGNOSIS — D631 Anemia in chronic kidney disease: Secondary | ICD-10-CM | POA: Diagnosis not present

## 2018-06-05 DIAGNOSIS — N186 End stage renal disease: Secondary | ICD-10-CM | POA: Diagnosis not present

## 2018-06-05 DIAGNOSIS — N2581 Secondary hyperparathyroidism of renal origin: Secondary | ICD-10-CM | POA: Diagnosis not present

## 2018-06-06 DIAGNOSIS — N186 End stage renal disease: Secondary | ICD-10-CM | POA: Diagnosis not present

## 2018-06-06 DIAGNOSIS — D631 Anemia in chronic kidney disease: Secondary | ICD-10-CM | POA: Diagnosis not present

## 2018-06-06 DIAGNOSIS — N2581 Secondary hyperparathyroidism of renal origin: Secondary | ICD-10-CM | POA: Diagnosis not present

## 2018-06-07 DIAGNOSIS — N186 End stage renal disease: Secondary | ICD-10-CM | POA: Diagnosis not present

## 2018-06-07 DIAGNOSIS — D631 Anemia in chronic kidney disease: Secondary | ICD-10-CM | POA: Diagnosis not present

## 2018-06-07 DIAGNOSIS — N2581 Secondary hyperparathyroidism of renal origin: Secondary | ICD-10-CM | POA: Diagnosis not present

## 2018-06-08 DIAGNOSIS — N2581 Secondary hyperparathyroidism of renal origin: Secondary | ICD-10-CM | POA: Diagnosis not present

## 2018-06-08 DIAGNOSIS — D631 Anemia in chronic kidney disease: Secondary | ICD-10-CM | POA: Diagnosis not present

## 2018-06-08 DIAGNOSIS — N186 End stage renal disease: Secondary | ICD-10-CM | POA: Diagnosis not present

## 2018-06-09 DIAGNOSIS — N2581 Secondary hyperparathyroidism of renal origin: Secondary | ICD-10-CM | POA: Diagnosis not present

## 2018-06-09 DIAGNOSIS — D631 Anemia in chronic kidney disease: Secondary | ICD-10-CM | POA: Diagnosis not present

## 2018-06-09 DIAGNOSIS — N186 End stage renal disease: Secondary | ICD-10-CM | POA: Diagnosis not present

## 2018-06-10 DIAGNOSIS — N186 End stage renal disease: Secondary | ICD-10-CM | POA: Diagnosis not present

## 2018-06-10 DIAGNOSIS — N2581 Secondary hyperparathyroidism of renal origin: Secondary | ICD-10-CM | POA: Diagnosis not present

## 2018-06-10 DIAGNOSIS — D631 Anemia in chronic kidney disease: Secondary | ICD-10-CM | POA: Diagnosis not present

## 2018-06-11 DIAGNOSIS — N2581 Secondary hyperparathyroidism of renal origin: Secondary | ICD-10-CM | POA: Diagnosis not present

## 2018-06-11 DIAGNOSIS — N186 End stage renal disease: Secondary | ICD-10-CM | POA: Diagnosis not present

## 2018-06-11 DIAGNOSIS — D631 Anemia in chronic kidney disease: Secondary | ICD-10-CM | POA: Diagnosis not present

## 2018-06-12 DIAGNOSIS — N186 End stage renal disease: Secondary | ICD-10-CM | POA: Diagnosis not present

## 2018-06-12 DIAGNOSIS — N2581 Secondary hyperparathyroidism of renal origin: Secondary | ICD-10-CM | POA: Diagnosis not present

## 2018-06-12 DIAGNOSIS — D631 Anemia in chronic kidney disease: Secondary | ICD-10-CM | POA: Diagnosis not present

## 2018-06-13 DIAGNOSIS — N186 End stage renal disease: Secondary | ICD-10-CM | POA: Diagnosis not present

## 2018-06-13 DIAGNOSIS — R569 Unspecified convulsions: Secondary | ICD-10-CM | POA: Diagnosis not present

## 2018-06-13 DIAGNOSIS — Z9189 Other specified personal risk factors, not elsewhere classified: Secondary | ICD-10-CM | POA: Diagnosis not present

## 2018-06-13 DIAGNOSIS — E785 Hyperlipidemia, unspecified: Secondary | ICD-10-CM | POA: Diagnosis not present

## 2018-06-13 DIAGNOSIS — D539 Nutritional anemia, unspecified: Secondary | ICD-10-CM | POA: Diagnosis not present

## 2018-06-13 DIAGNOSIS — E039 Hypothyroidism, unspecified: Secondary | ICD-10-CM | POA: Diagnosis not present

## 2018-06-13 DIAGNOSIS — R300 Dysuria: Secondary | ICD-10-CM | POA: Diagnosis not present

## 2018-06-13 DIAGNOSIS — D631 Anemia in chronic kidney disease: Secondary | ICD-10-CM | POA: Diagnosis not present

## 2018-06-13 DIAGNOSIS — N2581 Secondary hyperparathyroidism of renal origin: Secondary | ICD-10-CM | POA: Diagnosis not present

## 2018-06-14 DIAGNOSIS — N2581 Secondary hyperparathyroidism of renal origin: Secondary | ICD-10-CM | POA: Diagnosis not present

## 2018-06-14 DIAGNOSIS — D631 Anemia in chronic kidney disease: Secondary | ICD-10-CM | POA: Diagnosis not present

## 2018-06-14 DIAGNOSIS — N186 End stage renal disease: Secondary | ICD-10-CM | POA: Diagnosis not present

## 2018-06-15 DIAGNOSIS — D631 Anemia in chronic kidney disease: Secondary | ICD-10-CM | POA: Diagnosis not present

## 2018-06-15 DIAGNOSIS — N2581 Secondary hyperparathyroidism of renal origin: Secondary | ICD-10-CM | POA: Diagnosis not present

## 2018-06-15 DIAGNOSIS — N186 End stage renal disease: Secondary | ICD-10-CM | POA: Diagnosis not present

## 2018-06-16 DIAGNOSIS — N2581 Secondary hyperparathyroidism of renal origin: Secondary | ICD-10-CM | POA: Diagnosis not present

## 2018-06-16 DIAGNOSIS — N186 End stage renal disease: Secondary | ICD-10-CM | POA: Diagnosis not present

## 2018-06-16 DIAGNOSIS — D631 Anemia in chronic kidney disease: Secondary | ICD-10-CM | POA: Diagnosis not present

## 2018-06-17 DIAGNOSIS — N2581 Secondary hyperparathyroidism of renal origin: Secondary | ICD-10-CM | POA: Diagnosis not present

## 2018-06-17 DIAGNOSIS — N186 End stage renal disease: Secondary | ICD-10-CM | POA: Diagnosis not present

## 2018-06-17 DIAGNOSIS — D631 Anemia in chronic kidney disease: Secondary | ICD-10-CM | POA: Diagnosis not present

## 2018-06-18 DIAGNOSIS — N2581 Secondary hyperparathyroidism of renal origin: Secondary | ICD-10-CM | POA: Diagnosis not present

## 2018-06-18 DIAGNOSIS — D631 Anemia in chronic kidney disease: Secondary | ICD-10-CM | POA: Diagnosis not present

## 2018-06-18 DIAGNOSIS — N186 End stage renal disease: Secondary | ICD-10-CM | POA: Diagnosis not present

## 2018-06-19 DIAGNOSIS — N186 End stage renal disease: Secondary | ICD-10-CM | POA: Diagnosis not present

## 2018-06-19 DIAGNOSIS — D631 Anemia in chronic kidney disease: Secondary | ICD-10-CM | POA: Diagnosis not present

## 2018-06-19 DIAGNOSIS — N2581 Secondary hyperparathyroidism of renal origin: Secondary | ICD-10-CM | POA: Diagnosis not present

## 2018-06-20 DIAGNOSIS — D631 Anemia in chronic kidney disease: Secondary | ICD-10-CM | POA: Diagnosis not present

## 2018-06-20 DIAGNOSIS — N2581 Secondary hyperparathyroidism of renal origin: Secondary | ICD-10-CM | POA: Diagnosis not present

## 2018-06-20 DIAGNOSIS — N186 End stage renal disease: Secondary | ICD-10-CM | POA: Diagnosis not present

## 2018-06-21 DIAGNOSIS — D631 Anemia in chronic kidney disease: Secondary | ICD-10-CM | POA: Diagnosis not present

## 2018-06-21 DIAGNOSIS — N186 End stage renal disease: Secondary | ICD-10-CM | POA: Diagnosis not present

## 2018-06-21 DIAGNOSIS — N2581 Secondary hyperparathyroidism of renal origin: Secondary | ICD-10-CM | POA: Diagnosis not present

## 2018-06-22 DIAGNOSIS — N2581 Secondary hyperparathyroidism of renal origin: Secondary | ICD-10-CM | POA: Diagnosis not present

## 2018-06-22 DIAGNOSIS — N186 End stage renal disease: Secondary | ICD-10-CM | POA: Diagnosis not present

## 2018-06-22 DIAGNOSIS — D631 Anemia in chronic kidney disease: Secondary | ICD-10-CM | POA: Diagnosis not present

## 2018-06-22 DIAGNOSIS — Z992 Dependence on renal dialysis: Secondary | ICD-10-CM | POA: Diagnosis not present

## 2018-06-23 DIAGNOSIS — D631 Anemia in chronic kidney disease: Secondary | ICD-10-CM | POA: Diagnosis not present

## 2018-06-23 DIAGNOSIS — N186 End stage renal disease: Secondary | ICD-10-CM | POA: Diagnosis not present

## 2018-06-23 DIAGNOSIS — N2581 Secondary hyperparathyroidism of renal origin: Secondary | ICD-10-CM | POA: Diagnosis not present

## 2018-06-24 DIAGNOSIS — N186 End stage renal disease: Secondary | ICD-10-CM | POA: Diagnosis not present

## 2018-06-24 DIAGNOSIS — D631 Anemia in chronic kidney disease: Secondary | ICD-10-CM | POA: Diagnosis not present

## 2018-06-24 DIAGNOSIS — N2581 Secondary hyperparathyroidism of renal origin: Secondary | ICD-10-CM | POA: Diagnosis not present

## 2018-06-25 DIAGNOSIS — N186 End stage renal disease: Secondary | ICD-10-CM | POA: Diagnosis not present

## 2018-06-25 DIAGNOSIS — D631 Anemia in chronic kidney disease: Secondary | ICD-10-CM | POA: Diagnosis not present

## 2018-06-25 DIAGNOSIS — N2581 Secondary hyperparathyroidism of renal origin: Secondary | ICD-10-CM | POA: Diagnosis not present

## 2018-06-26 DIAGNOSIS — N2581 Secondary hyperparathyroidism of renal origin: Secondary | ICD-10-CM | POA: Diagnosis not present

## 2018-06-26 DIAGNOSIS — N186 End stage renal disease: Secondary | ICD-10-CM | POA: Diagnosis not present

## 2018-06-26 DIAGNOSIS — D631 Anemia in chronic kidney disease: Secondary | ICD-10-CM | POA: Diagnosis not present

## 2018-06-27 DIAGNOSIS — N186 End stage renal disease: Secondary | ICD-10-CM | POA: Diagnosis not present

## 2018-06-27 DIAGNOSIS — D631 Anemia in chronic kidney disease: Secondary | ICD-10-CM | POA: Diagnosis not present

## 2018-06-27 DIAGNOSIS — N2581 Secondary hyperparathyroidism of renal origin: Secondary | ICD-10-CM | POA: Diagnosis not present

## 2018-06-28 DIAGNOSIS — N2581 Secondary hyperparathyroidism of renal origin: Secondary | ICD-10-CM | POA: Diagnosis not present

## 2018-06-28 DIAGNOSIS — N186 End stage renal disease: Secondary | ICD-10-CM | POA: Diagnosis not present

## 2018-06-28 DIAGNOSIS — D631 Anemia in chronic kidney disease: Secondary | ICD-10-CM | POA: Diagnosis not present

## 2018-06-29 DIAGNOSIS — N186 End stage renal disease: Secondary | ICD-10-CM | POA: Diagnosis not present

## 2018-06-29 DIAGNOSIS — D631 Anemia in chronic kidney disease: Secondary | ICD-10-CM | POA: Diagnosis not present

## 2018-06-29 DIAGNOSIS — N2581 Secondary hyperparathyroidism of renal origin: Secondary | ICD-10-CM | POA: Diagnosis not present

## 2018-06-30 DIAGNOSIS — D631 Anemia in chronic kidney disease: Secondary | ICD-10-CM | POA: Diagnosis not present

## 2018-06-30 DIAGNOSIS — N2581 Secondary hyperparathyroidism of renal origin: Secondary | ICD-10-CM | POA: Diagnosis not present

## 2018-06-30 DIAGNOSIS — N186 End stage renal disease: Secondary | ICD-10-CM | POA: Diagnosis not present

## 2018-07-01 DIAGNOSIS — N186 End stage renal disease: Secondary | ICD-10-CM | POA: Diagnosis not present

## 2018-07-01 DIAGNOSIS — D631 Anemia in chronic kidney disease: Secondary | ICD-10-CM | POA: Diagnosis not present

## 2018-07-01 DIAGNOSIS — N2581 Secondary hyperparathyroidism of renal origin: Secondary | ICD-10-CM | POA: Diagnosis not present

## 2018-07-02 DIAGNOSIS — D631 Anemia in chronic kidney disease: Secondary | ICD-10-CM | POA: Diagnosis not present

## 2018-07-02 DIAGNOSIS — N2581 Secondary hyperparathyroidism of renal origin: Secondary | ICD-10-CM | POA: Diagnosis not present

## 2018-07-02 DIAGNOSIS — N186 End stage renal disease: Secondary | ICD-10-CM | POA: Diagnosis not present

## 2018-07-03 DIAGNOSIS — N2581 Secondary hyperparathyroidism of renal origin: Secondary | ICD-10-CM | POA: Diagnosis not present

## 2018-07-03 DIAGNOSIS — D631 Anemia in chronic kidney disease: Secondary | ICD-10-CM | POA: Diagnosis not present

## 2018-07-03 DIAGNOSIS — N186 End stage renal disease: Secondary | ICD-10-CM | POA: Diagnosis not present

## 2018-07-04 DIAGNOSIS — N2581 Secondary hyperparathyroidism of renal origin: Secondary | ICD-10-CM | POA: Diagnosis not present

## 2018-07-04 DIAGNOSIS — N186 End stage renal disease: Secondary | ICD-10-CM | POA: Diagnosis not present

## 2018-07-04 DIAGNOSIS — D631 Anemia in chronic kidney disease: Secondary | ICD-10-CM | POA: Diagnosis not present

## 2018-07-05 DIAGNOSIS — N2581 Secondary hyperparathyroidism of renal origin: Secondary | ICD-10-CM | POA: Diagnosis not present

## 2018-07-05 DIAGNOSIS — D631 Anemia in chronic kidney disease: Secondary | ICD-10-CM | POA: Diagnosis not present

## 2018-07-05 DIAGNOSIS — N186 End stage renal disease: Secondary | ICD-10-CM | POA: Diagnosis not present

## 2018-07-06 DIAGNOSIS — D631 Anemia in chronic kidney disease: Secondary | ICD-10-CM | POA: Diagnosis not present

## 2018-07-06 DIAGNOSIS — N186 End stage renal disease: Secondary | ICD-10-CM | POA: Diagnosis not present

## 2018-07-06 DIAGNOSIS — N2581 Secondary hyperparathyroidism of renal origin: Secondary | ICD-10-CM | POA: Diagnosis not present

## 2018-07-07 DIAGNOSIS — N186 End stage renal disease: Secondary | ICD-10-CM | POA: Diagnosis not present

## 2018-07-07 DIAGNOSIS — D631 Anemia in chronic kidney disease: Secondary | ICD-10-CM | POA: Diagnosis not present

## 2018-07-07 DIAGNOSIS — N2581 Secondary hyperparathyroidism of renal origin: Secondary | ICD-10-CM | POA: Diagnosis not present

## 2018-07-08 DIAGNOSIS — D631 Anemia in chronic kidney disease: Secondary | ICD-10-CM | POA: Diagnosis not present

## 2018-07-08 DIAGNOSIS — N186 End stage renal disease: Secondary | ICD-10-CM | POA: Diagnosis not present

## 2018-07-08 DIAGNOSIS — N2581 Secondary hyperparathyroidism of renal origin: Secondary | ICD-10-CM | POA: Diagnosis not present

## 2018-07-09 DIAGNOSIS — D631 Anemia in chronic kidney disease: Secondary | ICD-10-CM | POA: Diagnosis not present

## 2018-07-09 DIAGNOSIS — N186 End stage renal disease: Secondary | ICD-10-CM | POA: Diagnosis not present

## 2018-07-09 DIAGNOSIS — N2581 Secondary hyperparathyroidism of renal origin: Secondary | ICD-10-CM | POA: Diagnosis not present

## 2018-07-10 DIAGNOSIS — N2581 Secondary hyperparathyroidism of renal origin: Secondary | ICD-10-CM | POA: Diagnosis not present

## 2018-07-10 DIAGNOSIS — D631 Anemia in chronic kidney disease: Secondary | ICD-10-CM | POA: Diagnosis not present

## 2018-07-10 DIAGNOSIS — N186 End stage renal disease: Secondary | ICD-10-CM | POA: Diagnosis not present

## 2018-07-11 DIAGNOSIS — D631 Anemia in chronic kidney disease: Secondary | ICD-10-CM | POA: Diagnosis not present

## 2018-07-11 DIAGNOSIS — N186 End stage renal disease: Secondary | ICD-10-CM | POA: Diagnosis not present

## 2018-07-11 DIAGNOSIS — N2581 Secondary hyperparathyroidism of renal origin: Secondary | ICD-10-CM | POA: Diagnosis not present

## 2018-07-12 DIAGNOSIS — N186 End stage renal disease: Secondary | ICD-10-CM | POA: Diagnosis not present

## 2018-07-12 DIAGNOSIS — D631 Anemia in chronic kidney disease: Secondary | ICD-10-CM | POA: Diagnosis not present

## 2018-07-12 DIAGNOSIS — N2581 Secondary hyperparathyroidism of renal origin: Secondary | ICD-10-CM | POA: Diagnosis not present

## 2018-07-13 DIAGNOSIS — N2581 Secondary hyperparathyroidism of renal origin: Secondary | ICD-10-CM | POA: Diagnosis not present

## 2018-07-13 DIAGNOSIS — N186 End stage renal disease: Secondary | ICD-10-CM | POA: Diagnosis not present

## 2018-07-13 DIAGNOSIS — D631 Anemia in chronic kidney disease: Secondary | ICD-10-CM | POA: Diagnosis not present

## 2018-07-14 DIAGNOSIS — D631 Anemia in chronic kidney disease: Secondary | ICD-10-CM | POA: Diagnosis not present

## 2018-07-14 DIAGNOSIS — N186 End stage renal disease: Secondary | ICD-10-CM | POA: Diagnosis not present

## 2018-07-14 DIAGNOSIS — N2581 Secondary hyperparathyroidism of renal origin: Secondary | ICD-10-CM | POA: Diagnosis not present

## 2018-07-15 DIAGNOSIS — N2581 Secondary hyperparathyroidism of renal origin: Secondary | ICD-10-CM | POA: Diagnosis not present

## 2018-07-15 DIAGNOSIS — N186 End stage renal disease: Secondary | ICD-10-CM | POA: Diagnosis not present

## 2018-07-15 DIAGNOSIS — D631 Anemia in chronic kidney disease: Secondary | ICD-10-CM | POA: Diagnosis not present

## 2018-07-16 DIAGNOSIS — N186 End stage renal disease: Secondary | ICD-10-CM | POA: Diagnosis not present

## 2018-07-16 DIAGNOSIS — D631 Anemia in chronic kidney disease: Secondary | ICD-10-CM | POA: Diagnosis not present

## 2018-07-16 DIAGNOSIS — N2581 Secondary hyperparathyroidism of renal origin: Secondary | ICD-10-CM | POA: Diagnosis not present

## 2018-07-17 DIAGNOSIS — I120 Hypertensive chronic kidney disease with stage 5 chronic kidney disease or end stage renal disease: Secondary | ICD-10-CM | POA: Diagnosis not present

## 2018-07-17 DIAGNOSIS — K219 Gastro-esophageal reflux disease without esophagitis: Secondary | ICD-10-CM | POA: Diagnosis not present

## 2018-07-17 DIAGNOSIS — D631 Anemia in chronic kidney disease: Secondary | ICD-10-CM | POA: Diagnosis not present

## 2018-07-17 DIAGNOSIS — N2581 Secondary hyperparathyroidism of renal origin: Secondary | ICD-10-CM | POA: Diagnosis not present

## 2018-07-17 DIAGNOSIS — K319 Disease of stomach and duodenum, unspecified: Secondary | ICD-10-CM | POA: Diagnosis not present

## 2018-07-17 DIAGNOSIS — Z992 Dependence on renal dialysis: Secondary | ICD-10-CM | POA: Diagnosis not present

## 2018-07-17 DIAGNOSIS — K208 Other esophagitis: Secondary | ICD-10-CM | POA: Diagnosis not present

## 2018-07-17 DIAGNOSIS — G4733 Obstructive sleep apnea (adult) (pediatric): Secondary | ICD-10-CM | POA: Diagnosis not present

## 2018-07-17 DIAGNOSIS — R6250 Unspecified lack of expected normal physiological development in childhood: Secondary | ICD-10-CM | POA: Diagnosis not present

## 2018-07-17 DIAGNOSIS — N186 End stage renal disease: Secondary | ICD-10-CM | POA: Diagnosis not present

## 2018-07-17 DIAGNOSIS — F319 Bipolar disorder, unspecified: Secondary | ICD-10-CM | POA: Diagnosis not present

## 2018-07-17 DIAGNOSIS — K2980 Duodenitis without bleeding: Secondary | ICD-10-CM | POA: Diagnosis not present

## 2018-07-17 DIAGNOSIS — E039 Hypothyroidism, unspecified: Secondary | ICD-10-CM | POA: Diagnosis not present

## 2018-07-17 DIAGNOSIS — K228 Other specified diseases of esophagus: Secondary | ICD-10-CM | POA: Diagnosis not present

## 2018-07-17 DIAGNOSIS — R1013 Epigastric pain: Secondary | ICD-10-CM | POA: Diagnosis not present

## 2018-07-17 DIAGNOSIS — G40909 Epilepsy, unspecified, not intractable, without status epilepticus: Secondary | ICD-10-CM | POA: Diagnosis not present

## 2018-07-17 DIAGNOSIS — K3189 Other diseases of stomach and duodenum: Secondary | ICD-10-CM | POA: Diagnosis not present

## 2018-07-17 DIAGNOSIS — K2970 Gastritis, unspecified, without bleeding: Secondary | ICD-10-CM | POA: Diagnosis not present

## 2018-07-17 DIAGNOSIS — G3184 Mild cognitive impairment, so stated: Secondary | ICD-10-CM | POA: Diagnosis not present

## 2018-07-18 DIAGNOSIS — N186 End stage renal disease: Secondary | ICD-10-CM | POA: Diagnosis not present

## 2018-07-18 DIAGNOSIS — N2581 Secondary hyperparathyroidism of renal origin: Secondary | ICD-10-CM | POA: Diagnosis not present

## 2018-07-18 DIAGNOSIS — D631 Anemia in chronic kidney disease: Secondary | ICD-10-CM | POA: Diagnosis not present

## 2018-07-19 DIAGNOSIS — N186 End stage renal disease: Secondary | ICD-10-CM | POA: Diagnosis not present

## 2018-07-19 DIAGNOSIS — N2581 Secondary hyperparathyroidism of renal origin: Secondary | ICD-10-CM | POA: Diagnosis not present

## 2018-07-19 DIAGNOSIS — D631 Anemia in chronic kidney disease: Secondary | ICD-10-CM | POA: Diagnosis not present

## 2018-07-20 DIAGNOSIS — D631 Anemia in chronic kidney disease: Secondary | ICD-10-CM | POA: Diagnosis not present

## 2018-07-20 DIAGNOSIS — N2581 Secondary hyperparathyroidism of renal origin: Secondary | ICD-10-CM | POA: Diagnosis not present

## 2018-07-20 DIAGNOSIS — N186 End stage renal disease: Secondary | ICD-10-CM | POA: Diagnosis not present

## 2018-07-21 DIAGNOSIS — N2581 Secondary hyperparathyroidism of renal origin: Secondary | ICD-10-CM | POA: Diagnosis not present

## 2018-07-21 DIAGNOSIS — D631 Anemia in chronic kidney disease: Secondary | ICD-10-CM | POA: Diagnosis not present

## 2018-07-21 DIAGNOSIS — N186 End stage renal disease: Secondary | ICD-10-CM | POA: Diagnosis not present

## 2018-07-22 DIAGNOSIS — D631 Anemia in chronic kidney disease: Secondary | ICD-10-CM | POA: Diagnosis not present

## 2018-07-22 DIAGNOSIS — Z992 Dependence on renal dialysis: Secondary | ICD-10-CM | POA: Diagnosis not present

## 2018-07-22 DIAGNOSIS — N2581 Secondary hyperparathyroidism of renal origin: Secondary | ICD-10-CM | POA: Diagnosis not present

## 2018-07-22 DIAGNOSIS — N186 End stage renal disease: Secondary | ICD-10-CM | POA: Diagnosis not present

## 2018-07-23 DIAGNOSIS — N186 End stage renal disease: Secondary | ICD-10-CM | POA: Diagnosis not present

## 2018-07-23 DIAGNOSIS — Z23 Encounter for immunization: Secondary | ICD-10-CM | POA: Diagnosis not present

## 2018-07-23 DIAGNOSIS — D631 Anemia in chronic kidney disease: Secondary | ICD-10-CM | POA: Diagnosis not present

## 2018-07-23 DIAGNOSIS — N2581 Secondary hyperparathyroidism of renal origin: Secondary | ICD-10-CM | POA: Diagnosis not present

## 2018-07-24 DIAGNOSIS — N186 End stage renal disease: Secondary | ICD-10-CM | POA: Diagnosis not present

## 2018-07-24 DIAGNOSIS — N2581 Secondary hyperparathyroidism of renal origin: Secondary | ICD-10-CM | POA: Diagnosis not present

## 2018-07-24 DIAGNOSIS — Z23 Encounter for immunization: Secondary | ICD-10-CM | POA: Diagnosis not present

## 2018-07-24 DIAGNOSIS — D631 Anemia in chronic kidney disease: Secondary | ICD-10-CM | POA: Diagnosis not present

## 2018-07-25 DIAGNOSIS — N186 End stage renal disease: Secondary | ICD-10-CM | POA: Diagnosis not present

## 2018-07-25 DIAGNOSIS — Z23 Encounter for immunization: Secondary | ICD-10-CM | POA: Diagnosis not present

## 2018-07-25 DIAGNOSIS — D631 Anemia in chronic kidney disease: Secondary | ICD-10-CM | POA: Diagnosis not present

## 2018-07-25 DIAGNOSIS — N2581 Secondary hyperparathyroidism of renal origin: Secondary | ICD-10-CM | POA: Diagnosis not present

## 2018-07-26 DIAGNOSIS — D631 Anemia in chronic kidney disease: Secondary | ICD-10-CM | POA: Diagnosis not present

## 2018-07-26 DIAGNOSIS — Z23 Encounter for immunization: Secondary | ICD-10-CM | POA: Diagnosis not present

## 2018-07-26 DIAGNOSIS — N186 End stage renal disease: Secondary | ICD-10-CM | POA: Diagnosis not present

## 2018-07-26 DIAGNOSIS — N2581 Secondary hyperparathyroidism of renal origin: Secondary | ICD-10-CM | POA: Diagnosis not present

## 2018-07-27 DIAGNOSIS — N186 End stage renal disease: Secondary | ICD-10-CM | POA: Diagnosis not present

## 2018-07-27 DIAGNOSIS — D631 Anemia in chronic kidney disease: Secondary | ICD-10-CM | POA: Diagnosis not present

## 2018-07-27 DIAGNOSIS — Z23 Encounter for immunization: Secondary | ICD-10-CM | POA: Diagnosis not present

## 2018-07-27 DIAGNOSIS — N2581 Secondary hyperparathyroidism of renal origin: Secondary | ICD-10-CM | POA: Diagnosis not present

## 2018-07-28 DIAGNOSIS — D631 Anemia in chronic kidney disease: Secondary | ICD-10-CM | POA: Diagnosis not present

## 2018-07-28 DIAGNOSIS — N2581 Secondary hyperparathyroidism of renal origin: Secondary | ICD-10-CM | POA: Diagnosis not present

## 2018-07-28 DIAGNOSIS — Z23 Encounter for immunization: Secondary | ICD-10-CM | POA: Diagnosis not present

## 2018-07-28 DIAGNOSIS — N186 End stage renal disease: Secondary | ICD-10-CM | POA: Diagnosis not present

## 2018-07-29 DIAGNOSIS — D631 Anemia in chronic kidney disease: Secondary | ICD-10-CM | POA: Diagnosis not present

## 2018-07-29 DIAGNOSIS — Z23 Encounter for immunization: Secondary | ICD-10-CM | POA: Diagnosis not present

## 2018-07-29 DIAGNOSIS — N2581 Secondary hyperparathyroidism of renal origin: Secondary | ICD-10-CM | POA: Diagnosis not present

## 2018-07-29 DIAGNOSIS — N186 End stage renal disease: Secondary | ICD-10-CM | POA: Diagnosis not present

## 2018-07-30 ENCOUNTER — Ambulatory Visit: Payer: Medicare Other | Admitting: Nurse Practitioner

## 2018-07-30 DIAGNOSIS — N186 End stage renal disease: Secondary | ICD-10-CM | POA: Diagnosis not present

## 2018-07-30 DIAGNOSIS — N2581 Secondary hyperparathyroidism of renal origin: Secondary | ICD-10-CM | POA: Diagnosis not present

## 2018-07-30 DIAGNOSIS — Z23 Encounter for immunization: Secondary | ICD-10-CM | POA: Diagnosis not present

## 2018-07-30 DIAGNOSIS — D631 Anemia in chronic kidney disease: Secondary | ICD-10-CM | POA: Diagnosis not present

## 2018-07-31 DIAGNOSIS — Z23 Encounter for immunization: Secondary | ICD-10-CM | POA: Diagnosis not present

## 2018-07-31 DIAGNOSIS — D631 Anemia in chronic kidney disease: Secondary | ICD-10-CM | POA: Diagnosis not present

## 2018-07-31 DIAGNOSIS — N186 End stage renal disease: Secondary | ICD-10-CM | POA: Diagnosis not present

## 2018-07-31 DIAGNOSIS — N2581 Secondary hyperparathyroidism of renal origin: Secondary | ICD-10-CM | POA: Diagnosis not present

## 2018-08-01 DIAGNOSIS — D631 Anemia in chronic kidney disease: Secondary | ICD-10-CM | POA: Diagnosis not present

## 2018-08-01 DIAGNOSIS — N186 End stage renal disease: Secondary | ICD-10-CM | POA: Diagnosis not present

## 2018-08-01 DIAGNOSIS — Z23 Encounter for immunization: Secondary | ICD-10-CM | POA: Diagnosis not present

## 2018-08-01 DIAGNOSIS — N2581 Secondary hyperparathyroidism of renal origin: Secondary | ICD-10-CM | POA: Diagnosis not present

## 2018-08-02 DIAGNOSIS — D631 Anemia in chronic kidney disease: Secondary | ICD-10-CM | POA: Diagnosis not present

## 2018-08-02 DIAGNOSIS — Z23 Encounter for immunization: Secondary | ICD-10-CM | POA: Diagnosis not present

## 2018-08-02 DIAGNOSIS — N186 End stage renal disease: Secondary | ICD-10-CM | POA: Diagnosis not present

## 2018-08-02 DIAGNOSIS — N2581 Secondary hyperparathyroidism of renal origin: Secondary | ICD-10-CM | POA: Diagnosis not present

## 2018-08-03 DIAGNOSIS — N186 End stage renal disease: Secondary | ICD-10-CM | POA: Diagnosis not present

## 2018-08-03 DIAGNOSIS — D631 Anemia in chronic kidney disease: Secondary | ICD-10-CM | POA: Diagnosis not present

## 2018-08-03 DIAGNOSIS — Z23 Encounter for immunization: Secondary | ICD-10-CM | POA: Diagnosis not present

## 2018-08-03 DIAGNOSIS — N2581 Secondary hyperparathyroidism of renal origin: Secondary | ICD-10-CM | POA: Diagnosis not present

## 2018-08-04 DIAGNOSIS — N186 End stage renal disease: Secondary | ICD-10-CM | POA: Diagnosis not present

## 2018-08-04 DIAGNOSIS — Z23 Encounter for immunization: Secondary | ICD-10-CM | POA: Diagnosis not present

## 2018-08-04 DIAGNOSIS — D631 Anemia in chronic kidney disease: Secondary | ICD-10-CM | POA: Diagnosis not present

## 2018-08-04 DIAGNOSIS — N2581 Secondary hyperparathyroidism of renal origin: Secondary | ICD-10-CM | POA: Diagnosis not present

## 2018-08-05 DIAGNOSIS — Z4932 Encounter for adequacy testing for peritoneal dialysis: Secondary | ICD-10-CM | POA: Diagnosis not present

## 2018-08-05 DIAGNOSIS — N2581 Secondary hyperparathyroidism of renal origin: Secondary | ICD-10-CM | POA: Diagnosis not present

## 2018-08-05 DIAGNOSIS — D631 Anemia in chronic kidney disease: Secondary | ICD-10-CM | POA: Diagnosis not present

## 2018-08-05 DIAGNOSIS — Z23 Encounter for immunization: Secondary | ICD-10-CM | POA: Diagnosis not present

## 2018-08-05 DIAGNOSIS — N186 End stage renal disease: Secondary | ICD-10-CM | POA: Diagnosis not present

## 2018-08-06 DIAGNOSIS — Z23 Encounter for immunization: Secondary | ICD-10-CM | POA: Diagnosis not present

## 2018-08-06 DIAGNOSIS — D631 Anemia in chronic kidney disease: Secondary | ICD-10-CM | POA: Diagnosis not present

## 2018-08-06 DIAGNOSIS — N186 End stage renal disease: Secondary | ICD-10-CM | POA: Diagnosis not present

## 2018-08-06 DIAGNOSIS — N2581 Secondary hyperparathyroidism of renal origin: Secondary | ICD-10-CM | POA: Diagnosis not present

## 2018-08-07 DIAGNOSIS — N2581 Secondary hyperparathyroidism of renal origin: Secondary | ICD-10-CM | POA: Diagnosis not present

## 2018-08-07 DIAGNOSIS — Z23 Encounter for immunization: Secondary | ICD-10-CM | POA: Diagnosis not present

## 2018-08-07 DIAGNOSIS — N186 End stage renal disease: Secondary | ICD-10-CM | POA: Diagnosis not present

## 2018-08-07 DIAGNOSIS — D631 Anemia in chronic kidney disease: Secondary | ICD-10-CM | POA: Diagnosis not present

## 2018-08-08 DIAGNOSIS — R569 Unspecified convulsions: Secondary | ICD-10-CM | POA: Diagnosis not present

## 2018-08-08 DIAGNOSIS — E785 Hyperlipidemia, unspecified: Secondary | ICD-10-CM | POA: Diagnosis not present

## 2018-08-08 DIAGNOSIS — N186 End stage renal disease: Secondary | ICD-10-CM | POA: Diagnosis not present

## 2018-08-08 DIAGNOSIS — N2581 Secondary hyperparathyroidism of renal origin: Secondary | ICD-10-CM | POA: Diagnosis not present

## 2018-08-08 DIAGNOSIS — D539 Nutritional anemia, unspecified: Secondary | ICD-10-CM | POA: Diagnosis not present

## 2018-08-08 DIAGNOSIS — Z9189 Other specified personal risk factors, not elsewhere classified: Secondary | ICD-10-CM | POA: Diagnosis not present

## 2018-08-08 DIAGNOSIS — E039 Hypothyroidism, unspecified: Secondary | ICD-10-CM | POA: Diagnosis not present

## 2018-08-08 DIAGNOSIS — D631 Anemia in chronic kidney disease: Secondary | ICD-10-CM | POA: Diagnosis not present

## 2018-08-08 DIAGNOSIS — Z309 Encounter for contraceptive management, unspecified: Secondary | ICD-10-CM | POA: Diagnosis not present

## 2018-08-08 DIAGNOSIS — Z23 Encounter for immunization: Secondary | ICD-10-CM | POA: Diagnosis not present

## 2018-08-09 DIAGNOSIS — D631 Anemia in chronic kidney disease: Secondary | ICD-10-CM | POA: Diagnosis not present

## 2018-08-09 DIAGNOSIS — N186 End stage renal disease: Secondary | ICD-10-CM | POA: Diagnosis not present

## 2018-08-09 DIAGNOSIS — N2581 Secondary hyperparathyroidism of renal origin: Secondary | ICD-10-CM | POA: Diagnosis not present

## 2018-08-09 DIAGNOSIS — Z23 Encounter for immunization: Secondary | ICD-10-CM | POA: Diagnosis not present

## 2018-08-10 DIAGNOSIS — N186 End stage renal disease: Secondary | ICD-10-CM | POA: Diagnosis not present

## 2018-08-10 DIAGNOSIS — N2581 Secondary hyperparathyroidism of renal origin: Secondary | ICD-10-CM | POA: Diagnosis not present

## 2018-08-10 DIAGNOSIS — Z23 Encounter for immunization: Secondary | ICD-10-CM | POA: Diagnosis not present

## 2018-08-10 DIAGNOSIS — D631 Anemia in chronic kidney disease: Secondary | ICD-10-CM | POA: Diagnosis not present

## 2018-08-11 DIAGNOSIS — Z23 Encounter for immunization: Secondary | ICD-10-CM | POA: Diagnosis not present

## 2018-08-11 DIAGNOSIS — N2581 Secondary hyperparathyroidism of renal origin: Secondary | ICD-10-CM | POA: Diagnosis not present

## 2018-08-11 DIAGNOSIS — D631 Anemia in chronic kidney disease: Secondary | ICD-10-CM | POA: Diagnosis not present

## 2018-08-11 DIAGNOSIS — N186 End stage renal disease: Secondary | ICD-10-CM | POA: Diagnosis not present

## 2018-08-12 DIAGNOSIS — N2581 Secondary hyperparathyroidism of renal origin: Secondary | ICD-10-CM | POA: Diagnosis not present

## 2018-08-12 DIAGNOSIS — N186 End stage renal disease: Secondary | ICD-10-CM | POA: Diagnosis not present

## 2018-08-12 DIAGNOSIS — D631 Anemia in chronic kidney disease: Secondary | ICD-10-CM | POA: Diagnosis not present

## 2018-08-12 DIAGNOSIS — Z23 Encounter for immunization: Secondary | ICD-10-CM | POA: Diagnosis not present

## 2018-08-13 DIAGNOSIS — N186 End stage renal disease: Secondary | ICD-10-CM | POA: Diagnosis not present

## 2018-08-13 DIAGNOSIS — N2581 Secondary hyperparathyroidism of renal origin: Secondary | ICD-10-CM | POA: Diagnosis not present

## 2018-08-13 DIAGNOSIS — Z23 Encounter for immunization: Secondary | ICD-10-CM | POA: Diagnosis not present

## 2018-08-13 DIAGNOSIS — D631 Anemia in chronic kidney disease: Secondary | ICD-10-CM | POA: Diagnosis not present

## 2018-08-14 DIAGNOSIS — N2581 Secondary hyperparathyroidism of renal origin: Secondary | ICD-10-CM | POA: Diagnosis not present

## 2018-08-14 DIAGNOSIS — N186 End stage renal disease: Secondary | ICD-10-CM | POA: Diagnosis not present

## 2018-08-14 DIAGNOSIS — D631 Anemia in chronic kidney disease: Secondary | ICD-10-CM | POA: Diagnosis not present

## 2018-08-14 DIAGNOSIS — Z23 Encounter for immunization: Secondary | ICD-10-CM | POA: Diagnosis not present

## 2018-08-15 DIAGNOSIS — F250 Schizoaffective disorder, bipolar type: Secondary | ICD-10-CM | POA: Diagnosis not present

## 2018-08-15 DIAGNOSIS — F29 Unspecified psychosis not due to a substance or known physiological condition: Secondary | ICD-10-CM | POA: Diagnosis not present

## 2018-08-15 DIAGNOSIS — D631 Anemia in chronic kidney disease: Secondary | ICD-10-CM | POA: Diagnosis not present

## 2018-08-15 DIAGNOSIS — F71 Moderate intellectual disabilities: Secondary | ICD-10-CM | POA: Diagnosis not present

## 2018-08-15 DIAGNOSIS — F319 Bipolar disorder, unspecified: Secondary | ICD-10-CM | POA: Diagnosis not present

## 2018-08-15 DIAGNOSIS — N186 End stage renal disease: Secondary | ICD-10-CM | POA: Diagnosis not present

## 2018-08-15 DIAGNOSIS — Z23 Encounter for immunization: Secondary | ICD-10-CM | POA: Diagnosis not present

## 2018-08-15 DIAGNOSIS — N2581 Secondary hyperparathyroidism of renal origin: Secondary | ICD-10-CM | POA: Diagnosis not present

## 2018-08-16 DIAGNOSIS — D631 Anemia in chronic kidney disease: Secondary | ICD-10-CM | POA: Diagnosis not present

## 2018-08-16 DIAGNOSIS — N2581 Secondary hyperparathyroidism of renal origin: Secondary | ICD-10-CM | POA: Diagnosis not present

## 2018-08-16 DIAGNOSIS — Z23 Encounter for immunization: Secondary | ICD-10-CM | POA: Diagnosis not present

## 2018-08-16 DIAGNOSIS — N186 End stage renal disease: Secondary | ICD-10-CM | POA: Diagnosis not present

## 2018-08-17 DIAGNOSIS — N186 End stage renal disease: Secondary | ICD-10-CM | POA: Diagnosis not present

## 2018-08-17 DIAGNOSIS — N2581 Secondary hyperparathyroidism of renal origin: Secondary | ICD-10-CM | POA: Diagnosis not present

## 2018-08-17 DIAGNOSIS — Z23 Encounter for immunization: Secondary | ICD-10-CM | POA: Diagnosis not present

## 2018-08-17 DIAGNOSIS — D631 Anemia in chronic kidney disease: Secondary | ICD-10-CM | POA: Diagnosis not present

## 2018-08-18 DIAGNOSIS — Z23 Encounter for immunization: Secondary | ICD-10-CM | POA: Diagnosis not present

## 2018-08-18 DIAGNOSIS — D631 Anemia in chronic kidney disease: Secondary | ICD-10-CM | POA: Diagnosis not present

## 2018-08-18 DIAGNOSIS — N186 End stage renal disease: Secondary | ICD-10-CM | POA: Diagnosis not present

## 2018-08-18 DIAGNOSIS — N2581 Secondary hyperparathyroidism of renal origin: Secondary | ICD-10-CM | POA: Diagnosis not present

## 2018-08-19 DIAGNOSIS — D631 Anemia in chronic kidney disease: Secondary | ICD-10-CM | POA: Diagnosis not present

## 2018-08-19 DIAGNOSIS — N186 End stage renal disease: Secondary | ICD-10-CM | POA: Diagnosis not present

## 2018-08-19 DIAGNOSIS — Z23 Encounter for immunization: Secondary | ICD-10-CM | POA: Diagnosis not present

## 2018-08-19 DIAGNOSIS — N2581 Secondary hyperparathyroidism of renal origin: Secondary | ICD-10-CM | POA: Diagnosis not present

## 2018-08-20 DIAGNOSIS — N186 End stage renal disease: Secondary | ICD-10-CM | POA: Diagnosis not present

## 2018-08-20 DIAGNOSIS — Z23 Encounter for immunization: Secondary | ICD-10-CM | POA: Diagnosis not present

## 2018-08-20 DIAGNOSIS — D631 Anemia in chronic kidney disease: Secondary | ICD-10-CM | POA: Diagnosis not present

## 2018-08-20 DIAGNOSIS — N2581 Secondary hyperparathyroidism of renal origin: Secondary | ICD-10-CM | POA: Diagnosis not present

## 2018-08-21 DIAGNOSIS — Z23 Encounter for immunization: Secondary | ICD-10-CM | POA: Diagnosis not present

## 2018-08-21 DIAGNOSIS — D631 Anemia in chronic kidney disease: Secondary | ICD-10-CM | POA: Diagnosis not present

## 2018-08-21 DIAGNOSIS — N186 End stage renal disease: Secondary | ICD-10-CM | POA: Diagnosis not present

## 2018-08-21 DIAGNOSIS — N2581 Secondary hyperparathyroidism of renal origin: Secondary | ICD-10-CM | POA: Diagnosis not present

## 2018-08-22 DIAGNOSIS — Z23 Encounter for immunization: Secondary | ICD-10-CM | POA: Diagnosis not present

## 2018-08-22 DIAGNOSIS — Z992 Dependence on renal dialysis: Secondary | ICD-10-CM | POA: Diagnosis not present

## 2018-08-22 DIAGNOSIS — D631 Anemia in chronic kidney disease: Secondary | ICD-10-CM | POA: Diagnosis not present

## 2018-08-22 DIAGNOSIS — N2581 Secondary hyperparathyroidism of renal origin: Secondary | ICD-10-CM | POA: Diagnosis not present

## 2018-08-22 DIAGNOSIS — N186 End stage renal disease: Secondary | ICD-10-CM | POA: Diagnosis not present

## 2018-08-23 DIAGNOSIS — D631 Anemia in chronic kidney disease: Secondary | ICD-10-CM | POA: Diagnosis not present

## 2018-08-23 DIAGNOSIS — N2581 Secondary hyperparathyroidism of renal origin: Secondary | ICD-10-CM | POA: Diagnosis not present

## 2018-08-23 DIAGNOSIS — N186 End stage renal disease: Secondary | ICD-10-CM | POA: Diagnosis not present

## 2018-08-24 DIAGNOSIS — N2581 Secondary hyperparathyroidism of renal origin: Secondary | ICD-10-CM | POA: Diagnosis not present

## 2018-08-24 DIAGNOSIS — D631 Anemia in chronic kidney disease: Secondary | ICD-10-CM | POA: Diagnosis not present

## 2018-08-24 DIAGNOSIS — N186 End stage renal disease: Secondary | ICD-10-CM | POA: Diagnosis not present

## 2018-08-25 DIAGNOSIS — D631 Anemia in chronic kidney disease: Secondary | ICD-10-CM | POA: Diagnosis not present

## 2018-08-25 DIAGNOSIS — N186 End stage renal disease: Secondary | ICD-10-CM | POA: Diagnosis not present

## 2018-08-25 DIAGNOSIS — N2581 Secondary hyperparathyroidism of renal origin: Secondary | ICD-10-CM | POA: Diagnosis not present

## 2018-08-26 DIAGNOSIS — D631 Anemia in chronic kidney disease: Secondary | ICD-10-CM | POA: Diagnosis not present

## 2018-08-26 DIAGNOSIS — N2581 Secondary hyperparathyroidism of renal origin: Secondary | ICD-10-CM | POA: Diagnosis not present

## 2018-08-26 DIAGNOSIS — N186 End stage renal disease: Secondary | ICD-10-CM | POA: Diagnosis not present

## 2018-08-27 DIAGNOSIS — N2581 Secondary hyperparathyroidism of renal origin: Secondary | ICD-10-CM | POA: Diagnosis not present

## 2018-08-27 DIAGNOSIS — N186 End stage renal disease: Secondary | ICD-10-CM | POA: Diagnosis not present

## 2018-08-27 DIAGNOSIS — D631 Anemia in chronic kidney disease: Secondary | ICD-10-CM | POA: Diagnosis not present

## 2018-08-28 DIAGNOSIS — N2581 Secondary hyperparathyroidism of renal origin: Secondary | ICD-10-CM | POA: Diagnosis not present

## 2018-08-28 DIAGNOSIS — N186 End stage renal disease: Secondary | ICD-10-CM | POA: Diagnosis not present

## 2018-08-28 DIAGNOSIS — D631 Anemia in chronic kidney disease: Secondary | ICD-10-CM | POA: Diagnosis not present

## 2018-08-29 DIAGNOSIS — D631 Anemia in chronic kidney disease: Secondary | ICD-10-CM | POA: Diagnosis not present

## 2018-08-29 DIAGNOSIS — N186 End stage renal disease: Secondary | ICD-10-CM | POA: Diagnosis not present

## 2018-08-29 DIAGNOSIS — N2581 Secondary hyperparathyroidism of renal origin: Secondary | ICD-10-CM | POA: Diagnosis not present

## 2018-08-30 DIAGNOSIS — N2581 Secondary hyperparathyroidism of renal origin: Secondary | ICD-10-CM | POA: Diagnosis not present

## 2018-08-30 DIAGNOSIS — N186 End stage renal disease: Secondary | ICD-10-CM | POA: Diagnosis not present

## 2018-08-30 DIAGNOSIS — D631 Anemia in chronic kidney disease: Secondary | ICD-10-CM | POA: Diagnosis not present

## 2018-08-31 DIAGNOSIS — N2581 Secondary hyperparathyroidism of renal origin: Secondary | ICD-10-CM | POA: Diagnosis not present

## 2018-08-31 DIAGNOSIS — D631 Anemia in chronic kidney disease: Secondary | ICD-10-CM | POA: Diagnosis not present

## 2018-08-31 DIAGNOSIS — N186 End stage renal disease: Secondary | ICD-10-CM | POA: Diagnosis not present

## 2018-09-01 DIAGNOSIS — N2581 Secondary hyperparathyroidism of renal origin: Secondary | ICD-10-CM | POA: Diagnosis not present

## 2018-09-01 DIAGNOSIS — D631 Anemia in chronic kidney disease: Secondary | ICD-10-CM | POA: Diagnosis not present

## 2018-09-01 DIAGNOSIS — N186 End stage renal disease: Secondary | ICD-10-CM | POA: Diagnosis not present

## 2018-09-02 DIAGNOSIS — N2581 Secondary hyperparathyroidism of renal origin: Secondary | ICD-10-CM | POA: Diagnosis not present

## 2018-09-02 DIAGNOSIS — D631 Anemia in chronic kidney disease: Secondary | ICD-10-CM | POA: Diagnosis not present

## 2018-09-02 DIAGNOSIS — N186 End stage renal disease: Secondary | ICD-10-CM | POA: Diagnosis not present

## 2018-09-03 DIAGNOSIS — N186 End stage renal disease: Secondary | ICD-10-CM | POA: Diagnosis not present

## 2018-09-03 DIAGNOSIS — D631 Anemia in chronic kidney disease: Secondary | ICD-10-CM | POA: Diagnosis not present

## 2018-09-03 DIAGNOSIS — N2581 Secondary hyperparathyroidism of renal origin: Secondary | ICD-10-CM | POA: Diagnosis not present

## 2018-09-04 DIAGNOSIS — N186 End stage renal disease: Secondary | ICD-10-CM | POA: Diagnosis not present

## 2018-09-04 DIAGNOSIS — D631 Anemia in chronic kidney disease: Secondary | ICD-10-CM | POA: Diagnosis not present

## 2018-09-04 DIAGNOSIS — N2581 Secondary hyperparathyroidism of renal origin: Secondary | ICD-10-CM | POA: Diagnosis not present

## 2018-09-05 DIAGNOSIS — N2581 Secondary hyperparathyroidism of renal origin: Secondary | ICD-10-CM | POA: Diagnosis not present

## 2018-09-05 DIAGNOSIS — D631 Anemia in chronic kidney disease: Secondary | ICD-10-CM | POA: Diagnosis not present

## 2018-09-05 DIAGNOSIS — N186 End stage renal disease: Secondary | ICD-10-CM | POA: Diagnosis not present

## 2018-09-06 DIAGNOSIS — D631 Anemia in chronic kidney disease: Secondary | ICD-10-CM | POA: Diagnosis not present

## 2018-09-06 DIAGNOSIS — N2581 Secondary hyperparathyroidism of renal origin: Secondary | ICD-10-CM | POA: Diagnosis not present

## 2018-09-06 DIAGNOSIS — N186 End stage renal disease: Secondary | ICD-10-CM | POA: Diagnosis not present

## 2018-09-07 DIAGNOSIS — N186 End stage renal disease: Secondary | ICD-10-CM | POA: Diagnosis not present

## 2018-09-07 DIAGNOSIS — D631 Anemia in chronic kidney disease: Secondary | ICD-10-CM | POA: Diagnosis not present

## 2018-09-07 DIAGNOSIS — N2581 Secondary hyperparathyroidism of renal origin: Secondary | ICD-10-CM | POA: Diagnosis not present

## 2018-09-08 DIAGNOSIS — N2581 Secondary hyperparathyroidism of renal origin: Secondary | ICD-10-CM | POA: Diagnosis not present

## 2018-09-08 DIAGNOSIS — D631 Anemia in chronic kidney disease: Secondary | ICD-10-CM | POA: Diagnosis not present

## 2018-09-08 DIAGNOSIS — N186 End stage renal disease: Secondary | ICD-10-CM | POA: Diagnosis not present

## 2018-09-09 DIAGNOSIS — N186 End stage renal disease: Secondary | ICD-10-CM | POA: Diagnosis not present

## 2018-09-09 DIAGNOSIS — N2581 Secondary hyperparathyroidism of renal origin: Secondary | ICD-10-CM | POA: Diagnosis not present

## 2018-09-09 DIAGNOSIS — D631 Anemia in chronic kidney disease: Secondary | ICD-10-CM | POA: Diagnosis not present

## 2018-09-10 DIAGNOSIS — D631 Anemia in chronic kidney disease: Secondary | ICD-10-CM | POA: Diagnosis not present

## 2018-09-10 DIAGNOSIS — N2581 Secondary hyperparathyroidism of renal origin: Secondary | ICD-10-CM | POA: Diagnosis not present

## 2018-09-10 DIAGNOSIS — N186 End stage renal disease: Secondary | ICD-10-CM | POA: Diagnosis not present

## 2018-09-11 DIAGNOSIS — D631 Anemia in chronic kidney disease: Secondary | ICD-10-CM | POA: Diagnosis not present

## 2018-09-11 DIAGNOSIS — N186 End stage renal disease: Secondary | ICD-10-CM | POA: Diagnosis not present

## 2018-09-11 DIAGNOSIS — N2581 Secondary hyperparathyroidism of renal origin: Secondary | ICD-10-CM | POA: Diagnosis not present

## 2018-09-12 DIAGNOSIS — N186 End stage renal disease: Secondary | ICD-10-CM | POA: Diagnosis not present

## 2018-09-12 DIAGNOSIS — D631 Anemia in chronic kidney disease: Secondary | ICD-10-CM | POA: Diagnosis not present

## 2018-09-12 DIAGNOSIS — N2581 Secondary hyperparathyroidism of renal origin: Secondary | ICD-10-CM | POA: Diagnosis not present

## 2018-09-13 DIAGNOSIS — D631 Anemia in chronic kidney disease: Secondary | ICD-10-CM | POA: Diagnosis not present

## 2018-09-13 DIAGNOSIS — N186 End stage renal disease: Secondary | ICD-10-CM | POA: Diagnosis not present

## 2018-09-13 DIAGNOSIS — N2581 Secondary hyperparathyroidism of renal origin: Secondary | ICD-10-CM | POA: Diagnosis not present

## 2018-09-14 DIAGNOSIS — D631 Anemia in chronic kidney disease: Secondary | ICD-10-CM | POA: Diagnosis not present

## 2018-09-14 DIAGNOSIS — N2581 Secondary hyperparathyroidism of renal origin: Secondary | ICD-10-CM | POA: Diagnosis not present

## 2018-09-14 DIAGNOSIS — N186 End stage renal disease: Secondary | ICD-10-CM | POA: Diagnosis not present

## 2018-09-15 DIAGNOSIS — N186 End stage renal disease: Secondary | ICD-10-CM | POA: Diagnosis not present

## 2018-09-15 DIAGNOSIS — D631 Anemia in chronic kidney disease: Secondary | ICD-10-CM | POA: Diagnosis not present

## 2018-09-15 DIAGNOSIS — N2581 Secondary hyperparathyroidism of renal origin: Secondary | ICD-10-CM | POA: Diagnosis not present

## 2018-09-16 DIAGNOSIS — D631 Anemia in chronic kidney disease: Secondary | ICD-10-CM | POA: Diagnosis not present

## 2018-09-16 DIAGNOSIS — N2581 Secondary hyperparathyroidism of renal origin: Secondary | ICD-10-CM | POA: Diagnosis not present

## 2018-09-16 DIAGNOSIS — N186 End stage renal disease: Secondary | ICD-10-CM | POA: Diagnosis not present

## 2018-09-17 DIAGNOSIS — N186 End stage renal disease: Secondary | ICD-10-CM | POA: Diagnosis not present

## 2018-09-17 DIAGNOSIS — D631 Anemia in chronic kidney disease: Secondary | ICD-10-CM | POA: Diagnosis not present

## 2018-09-17 DIAGNOSIS — N2581 Secondary hyperparathyroidism of renal origin: Secondary | ICD-10-CM | POA: Diagnosis not present

## 2018-09-18 DIAGNOSIS — N2581 Secondary hyperparathyroidism of renal origin: Secondary | ICD-10-CM | POA: Diagnosis not present

## 2018-09-18 DIAGNOSIS — N186 End stage renal disease: Secondary | ICD-10-CM | POA: Diagnosis not present

## 2018-09-18 DIAGNOSIS — D631 Anemia in chronic kidney disease: Secondary | ICD-10-CM | POA: Diagnosis not present

## 2018-09-19 DIAGNOSIS — D631 Anemia in chronic kidney disease: Secondary | ICD-10-CM | POA: Diagnosis not present

## 2018-09-19 DIAGNOSIS — N2581 Secondary hyperparathyroidism of renal origin: Secondary | ICD-10-CM | POA: Diagnosis not present

## 2018-09-19 DIAGNOSIS — N186 End stage renal disease: Secondary | ICD-10-CM | POA: Diagnosis not present

## 2018-09-20 DIAGNOSIS — D631 Anemia in chronic kidney disease: Secondary | ICD-10-CM | POA: Diagnosis not present

## 2018-09-20 DIAGNOSIS — N2581 Secondary hyperparathyroidism of renal origin: Secondary | ICD-10-CM | POA: Diagnosis not present

## 2018-09-20 DIAGNOSIS — N186 End stage renal disease: Secondary | ICD-10-CM | POA: Diagnosis not present

## 2018-09-21 DIAGNOSIS — N2581 Secondary hyperparathyroidism of renal origin: Secondary | ICD-10-CM | POA: Diagnosis not present

## 2018-09-21 DIAGNOSIS — Z992 Dependence on renal dialysis: Secondary | ICD-10-CM | POA: Diagnosis not present

## 2018-09-21 DIAGNOSIS — N186 End stage renal disease: Secondary | ICD-10-CM | POA: Diagnosis not present

## 2018-09-21 DIAGNOSIS — D631 Anemia in chronic kidney disease: Secondary | ICD-10-CM | POA: Diagnosis not present

## 2018-09-22 DIAGNOSIS — N2581 Secondary hyperparathyroidism of renal origin: Secondary | ICD-10-CM | POA: Diagnosis not present

## 2018-09-22 DIAGNOSIS — N186 End stage renal disease: Secondary | ICD-10-CM | POA: Diagnosis not present

## 2018-09-22 DIAGNOSIS — Z992 Dependence on renal dialysis: Secondary | ICD-10-CM | POA: Diagnosis not present

## 2018-09-22 DIAGNOSIS — D631 Anemia in chronic kidney disease: Secondary | ICD-10-CM | POA: Diagnosis not present

## 2018-09-23 DIAGNOSIS — N2581 Secondary hyperparathyroidism of renal origin: Secondary | ICD-10-CM | POA: Diagnosis not present

## 2018-09-23 DIAGNOSIS — Z992 Dependence on renal dialysis: Secondary | ICD-10-CM | POA: Diagnosis not present

## 2018-09-23 DIAGNOSIS — N186 End stage renal disease: Secondary | ICD-10-CM | POA: Diagnosis not present

## 2018-09-23 DIAGNOSIS — D631 Anemia in chronic kidney disease: Secondary | ICD-10-CM | POA: Diagnosis not present

## 2018-09-24 DIAGNOSIS — N186 End stage renal disease: Secondary | ICD-10-CM | POA: Diagnosis not present

## 2018-09-24 DIAGNOSIS — Z992 Dependence on renal dialysis: Secondary | ICD-10-CM | POA: Diagnosis not present

## 2018-09-24 DIAGNOSIS — D631 Anemia in chronic kidney disease: Secondary | ICD-10-CM | POA: Diagnosis not present

## 2018-09-24 DIAGNOSIS — N2581 Secondary hyperparathyroidism of renal origin: Secondary | ICD-10-CM | POA: Diagnosis not present

## 2018-09-25 DIAGNOSIS — Z992 Dependence on renal dialysis: Secondary | ICD-10-CM | POA: Diagnosis not present

## 2018-09-25 DIAGNOSIS — N186 End stage renal disease: Secondary | ICD-10-CM | POA: Diagnosis not present

## 2018-09-25 DIAGNOSIS — N2581 Secondary hyperparathyroidism of renal origin: Secondary | ICD-10-CM | POA: Diagnosis not present

## 2018-09-25 DIAGNOSIS — D631 Anemia in chronic kidney disease: Secondary | ICD-10-CM | POA: Diagnosis not present

## 2018-09-26 DIAGNOSIS — N186 End stage renal disease: Secondary | ICD-10-CM | POA: Diagnosis not present

## 2018-09-26 DIAGNOSIS — N2581 Secondary hyperparathyroidism of renal origin: Secondary | ICD-10-CM | POA: Diagnosis not present

## 2018-09-26 DIAGNOSIS — D631 Anemia in chronic kidney disease: Secondary | ICD-10-CM | POA: Diagnosis not present

## 2018-09-26 DIAGNOSIS — Z992 Dependence on renal dialysis: Secondary | ICD-10-CM | POA: Diagnosis not present

## 2018-09-27 DIAGNOSIS — D631 Anemia in chronic kidney disease: Secondary | ICD-10-CM | POA: Diagnosis not present

## 2018-09-27 DIAGNOSIS — Z992 Dependence on renal dialysis: Secondary | ICD-10-CM | POA: Diagnosis not present

## 2018-09-27 DIAGNOSIS — N186 End stage renal disease: Secondary | ICD-10-CM | POA: Diagnosis not present

## 2018-09-27 DIAGNOSIS — N2581 Secondary hyperparathyroidism of renal origin: Secondary | ICD-10-CM | POA: Diagnosis not present

## 2018-09-28 DIAGNOSIS — N186 End stage renal disease: Secondary | ICD-10-CM | POA: Diagnosis not present

## 2018-09-28 DIAGNOSIS — N2581 Secondary hyperparathyroidism of renal origin: Secondary | ICD-10-CM | POA: Diagnosis not present

## 2018-09-28 DIAGNOSIS — Z992 Dependence on renal dialysis: Secondary | ICD-10-CM | POA: Diagnosis not present

## 2018-09-28 DIAGNOSIS — D631 Anemia in chronic kidney disease: Secondary | ICD-10-CM | POA: Diagnosis not present

## 2018-09-29 DIAGNOSIS — R000 Tachycardia, unspecified: Secondary | ICD-10-CM | POA: Diagnosis not present

## 2018-09-29 DIAGNOSIS — F319 Bipolar disorder, unspecified: Secondary | ICD-10-CM | POA: Diagnosis present

## 2018-09-29 DIAGNOSIS — E039 Hypothyroidism, unspecified: Secondary | ICD-10-CM | POA: Diagnosis present

## 2018-09-29 DIAGNOSIS — Z87440 Personal history of urinary (tract) infections: Secondary | ICD-10-CM | POA: Diagnosis not present

## 2018-09-29 DIAGNOSIS — R6250 Unspecified lack of expected normal physiological development in childhood: Secondary | ICD-10-CM | POA: Diagnosis present

## 2018-09-29 DIAGNOSIS — G4733 Obstructive sleep apnea (adult) (pediatric): Secondary | ICD-10-CM | POA: Diagnosis present

## 2018-09-29 DIAGNOSIS — Z79899 Other long term (current) drug therapy: Secondary | ICD-10-CM | POA: Diagnosis not present

## 2018-09-29 DIAGNOSIS — I951 Orthostatic hypotension: Secondary | ICD-10-CM | POA: Diagnosis present

## 2018-09-29 DIAGNOSIS — R112 Nausea with vomiting, unspecified: Secondary | ICD-10-CM | POA: Diagnosis not present

## 2018-09-29 DIAGNOSIS — N186 End stage renal disease: Secondary | ICD-10-CM | POA: Diagnosis not present

## 2018-09-29 DIAGNOSIS — Z992 Dependence on renal dialysis: Secondary | ICD-10-CM | POA: Diagnosis not present

## 2018-09-29 DIAGNOSIS — W19XXXA Unspecified fall, initial encounter: Secondary | ICD-10-CM | POA: Diagnosis not present

## 2018-09-29 DIAGNOSIS — I120 Hypertensive chronic kidney disease with stage 5 chronic kidney disease or end stage renal disease: Secondary | ICD-10-CM | POA: Diagnosis present

## 2018-09-29 DIAGNOSIS — Z9989 Dependence on other enabling machines and devices: Secondary | ICD-10-CM | POA: Diagnosis not present

## 2018-09-29 DIAGNOSIS — E872 Acidosis: Secondary | ICD-10-CM | POA: Diagnosis not present

## 2018-09-29 DIAGNOSIS — R569 Unspecified convulsions: Secondary | ICD-10-CM | POA: Diagnosis not present

## 2018-09-29 DIAGNOSIS — N2581 Secondary hyperparathyroidism of renal origin: Secondary | ICD-10-CM | POA: Diagnosis not present

## 2018-09-29 DIAGNOSIS — G8929 Other chronic pain: Secondary | ICD-10-CM | POA: Diagnosis present

## 2018-09-29 DIAGNOSIS — R1084 Generalized abdominal pain: Secondary | ICD-10-CM | POA: Diagnosis not present

## 2018-09-29 DIAGNOSIS — G40909 Epilepsy, unspecified, not intractable, without status epilepticus: Secondary | ICD-10-CM | POA: Diagnosis present

## 2018-09-29 DIAGNOSIS — R350 Frequency of micturition: Secondary | ICD-10-CM | POA: Diagnosis present

## 2018-09-29 DIAGNOSIS — M109 Gout, unspecified: Secondary | ICD-10-CM | POA: Diagnosis present

## 2018-09-29 DIAGNOSIS — Z9181 History of falling: Secondary | ICD-10-CM | POA: Diagnosis not present

## 2018-09-29 DIAGNOSIS — R55 Syncope and collapse: Secondary | ICD-10-CM | POA: Diagnosis not present

## 2018-09-29 DIAGNOSIS — R109 Unspecified abdominal pain: Secondary | ICD-10-CM | POA: Diagnosis not present

## 2018-09-29 DIAGNOSIS — E785 Hyperlipidemia, unspecified: Secondary | ICD-10-CM | POA: Diagnosis present

## 2018-09-29 DIAGNOSIS — E861 Hypovolemia: Secondary | ICD-10-CM | POA: Diagnosis present

## 2018-09-29 DIAGNOSIS — F79 Unspecified intellectual disabilities: Secondary | ICD-10-CM | POA: Diagnosis present

## 2018-09-29 DIAGNOSIS — D631 Anemia in chronic kidney disease: Secondary | ICD-10-CM | POA: Diagnosis not present

## 2018-09-29 DIAGNOSIS — R9431 Abnormal electrocardiogram [ECG] [EKG]: Secondary | ICD-10-CM | POA: Diagnosis not present

## 2018-10-02 ENCOUNTER — Telehealth: Payer: Self-pay | Admitting: Pulmonary Disease

## 2018-10-02 NOTE — Telephone Encounter (Signed)
Received a fax from [facility] stating that the patient needs a OV since she received a cpap machine back on 04/10/18. Per her chart, she had an appt on 10/8 with Tonya but did not show up.   Will contact patient to get her scheduled to see RA or a NP for a compliance visit.

## 2018-10-05 DIAGNOSIS — N2581 Secondary hyperparathyroidism of renal origin: Secondary | ICD-10-CM | POA: Diagnosis not present

## 2018-10-05 DIAGNOSIS — N186 End stage renal disease: Secondary | ICD-10-CM | POA: Diagnosis not present

## 2018-10-05 DIAGNOSIS — D631 Anemia in chronic kidney disease: Secondary | ICD-10-CM | POA: Diagnosis not present

## 2018-10-06 DIAGNOSIS — Z992 Dependence on renal dialysis: Secondary | ICD-10-CM | POA: Diagnosis not present

## 2018-10-06 DIAGNOSIS — D631 Anemia in chronic kidney disease: Secondary | ICD-10-CM | POA: Diagnosis not present

## 2018-10-06 DIAGNOSIS — N2581 Secondary hyperparathyroidism of renal origin: Secondary | ICD-10-CM | POA: Diagnosis not present

## 2018-10-06 DIAGNOSIS — N186 End stage renal disease: Secondary | ICD-10-CM | POA: Diagnosis not present

## 2018-10-07 DIAGNOSIS — Z992 Dependence on renal dialysis: Secondary | ICD-10-CM | POA: Diagnosis not present

## 2018-10-07 DIAGNOSIS — D631 Anemia in chronic kidney disease: Secondary | ICD-10-CM | POA: Diagnosis not present

## 2018-10-07 DIAGNOSIS — N2581 Secondary hyperparathyroidism of renal origin: Secondary | ICD-10-CM | POA: Diagnosis not present

## 2018-10-07 DIAGNOSIS — N186 End stage renal disease: Secondary | ICD-10-CM | POA: Diagnosis not present

## 2018-10-08 DIAGNOSIS — N2581 Secondary hyperparathyroidism of renal origin: Secondary | ICD-10-CM | POA: Diagnosis not present

## 2018-10-08 DIAGNOSIS — N186 End stage renal disease: Secondary | ICD-10-CM | POA: Diagnosis not present

## 2018-10-08 DIAGNOSIS — D631 Anemia in chronic kidney disease: Secondary | ICD-10-CM | POA: Diagnosis not present

## 2018-10-08 DIAGNOSIS — Z992 Dependence on renal dialysis: Secondary | ICD-10-CM | POA: Diagnosis not present

## 2018-10-09 DIAGNOSIS — N186 End stage renal disease: Secondary | ICD-10-CM | POA: Diagnosis not present

## 2018-10-09 DIAGNOSIS — Z992 Dependence on renal dialysis: Secondary | ICD-10-CM | POA: Diagnosis not present

## 2018-10-09 DIAGNOSIS — N2581 Secondary hyperparathyroidism of renal origin: Secondary | ICD-10-CM | POA: Diagnosis not present

## 2018-10-09 DIAGNOSIS — D631 Anemia in chronic kidney disease: Secondary | ICD-10-CM | POA: Diagnosis not present

## 2018-10-10 DIAGNOSIS — N186 End stage renal disease: Secondary | ICD-10-CM | POA: Diagnosis not present

## 2018-10-10 DIAGNOSIS — D631 Anemia in chronic kidney disease: Secondary | ICD-10-CM | POA: Diagnosis not present

## 2018-10-10 DIAGNOSIS — Z992 Dependence on renal dialysis: Secondary | ICD-10-CM | POA: Diagnosis not present

## 2018-10-10 DIAGNOSIS — N2581 Secondary hyperparathyroidism of renal origin: Secondary | ICD-10-CM | POA: Diagnosis not present

## 2018-10-11 DIAGNOSIS — D631 Anemia in chronic kidney disease: Secondary | ICD-10-CM | POA: Diagnosis not present

## 2018-10-11 DIAGNOSIS — N2581 Secondary hyperparathyroidism of renal origin: Secondary | ICD-10-CM | POA: Diagnosis not present

## 2018-10-11 DIAGNOSIS — Z992 Dependence on renal dialysis: Secondary | ICD-10-CM | POA: Diagnosis not present

## 2018-10-11 DIAGNOSIS — N186 End stage renal disease: Secondary | ICD-10-CM | POA: Diagnosis not present

## 2018-10-12 DIAGNOSIS — N186 End stage renal disease: Secondary | ICD-10-CM | POA: Diagnosis not present

## 2018-10-12 DIAGNOSIS — D631 Anemia in chronic kidney disease: Secondary | ICD-10-CM | POA: Diagnosis not present

## 2018-10-12 DIAGNOSIS — Z992 Dependence on renal dialysis: Secondary | ICD-10-CM | POA: Diagnosis not present

## 2018-10-12 DIAGNOSIS — N2581 Secondary hyperparathyroidism of renal origin: Secondary | ICD-10-CM | POA: Diagnosis not present

## 2018-10-13 DIAGNOSIS — D631 Anemia in chronic kidney disease: Secondary | ICD-10-CM | POA: Diagnosis not present

## 2018-10-13 DIAGNOSIS — N2581 Secondary hyperparathyroidism of renal origin: Secondary | ICD-10-CM | POA: Diagnosis not present

## 2018-10-13 DIAGNOSIS — N186 End stage renal disease: Secondary | ICD-10-CM | POA: Diagnosis not present

## 2018-10-13 DIAGNOSIS — Z992 Dependence on renal dialysis: Secondary | ICD-10-CM | POA: Diagnosis not present

## 2018-10-14 DIAGNOSIS — M545 Low back pain: Secondary | ICD-10-CM | POA: Diagnosis not present

## 2018-10-14 DIAGNOSIS — Z9189 Other specified personal risk factors, not elsewhere classified: Secondary | ICD-10-CM | POA: Diagnosis not present

## 2018-10-14 DIAGNOSIS — N186 End stage renal disease: Secondary | ICD-10-CM | POA: Diagnosis not present

## 2018-10-14 DIAGNOSIS — E785 Hyperlipidemia, unspecified: Secondary | ICD-10-CM | POA: Diagnosis not present

## 2018-10-14 DIAGNOSIS — E039 Hypothyroidism, unspecified: Secondary | ICD-10-CM | POA: Diagnosis not present

## 2018-10-14 DIAGNOSIS — D638 Anemia in other chronic diseases classified elsewhere: Secondary | ICD-10-CM | POA: Diagnosis not present

## 2018-10-14 DIAGNOSIS — D539 Nutritional anemia, unspecified: Secondary | ICD-10-CM | POA: Diagnosis not present

## 2018-10-14 DIAGNOSIS — D631 Anemia in chronic kidney disease: Secondary | ICD-10-CM | POA: Diagnosis not present

## 2018-10-14 DIAGNOSIS — Z309 Encounter for contraceptive management, unspecified: Secondary | ICD-10-CM | POA: Diagnosis not present

## 2018-10-14 DIAGNOSIS — R569 Unspecified convulsions: Secondary | ICD-10-CM | POA: Diagnosis not present

## 2018-10-14 DIAGNOSIS — N2581 Secondary hyperparathyroidism of renal origin: Secondary | ICD-10-CM | POA: Diagnosis not present

## 2018-10-14 DIAGNOSIS — Z30011 Encounter for initial prescription of contraceptive pills: Secondary | ICD-10-CM | POA: Diagnosis not present

## 2018-10-14 DIAGNOSIS — Z992 Dependence on renal dialysis: Secondary | ICD-10-CM | POA: Diagnosis not present

## 2018-10-15 DIAGNOSIS — N2581 Secondary hyperparathyroidism of renal origin: Secondary | ICD-10-CM | POA: Diagnosis not present

## 2018-10-15 DIAGNOSIS — D631 Anemia in chronic kidney disease: Secondary | ICD-10-CM | POA: Diagnosis not present

## 2018-10-15 DIAGNOSIS — N186 End stage renal disease: Secondary | ICD-10-CM | POA: Diagnosis not present

## 2018-10-15 DIAGNOSIS — Z992 Dependence on renal dialysis: Secondary | ICD-10-CM | POA: Diagnosis not present

## 2018-10-16 DIAGNOSIS — N186 End stage renal disease: Secondary | ICD-10-CM | POA: Diagnosis not present

## 2018-10-16 DIAGNOSIS — D631 Anemia in chronic kidney disease: Secondary | ICD-10-CM | POA: Diagnosis not present

## 2018-10-16 DIAGNOSIS — N2581 Secondary hyperparathyroidism of renal origin: Secondary | ICD-10-CM | POA: Diagnosis not present

## 2018-10-16 DIAGNOSIS — Z992 Dependence on renal dialysis: Secondary | ICD-10-CM | POA: Diagnosis not present

## 2018-10-17 DIAGNOSIS — D631 Anemia in chronic kidney disease: Secondary | ICD-10-CM | POA: Diagnosis not present

## 2018-10-17 DIAGNOSIS — Z992 Dependence on renal dialysis: Secondary | ICD-10-CM | POA: Diagnosis not present

## 2018-10-17 DIAGNOSIS — N186 End stage renal disease: Secondary | ICD-10-CM | POA: Diagnosis not present

## 2018-10-17 DIAGNOSIS — N2581 Secondary hyperparathyroidism of renal origin: Secondary | ICD-10-CM | POA: Diagnosis not present

## 2018-10-18 DIAGNOSIS — N186 End stage renal disease: Secondary | ICD-10-CM | POA: Diagnosis not present

## 2018-10-18 DIAGNOSIS — Z992 Dependence on renal dialysis: Secondary | ICD-10-CM | POA: Diagnosis not present

## 2018-10-18 DIAGNOSIS — D631 Anemia in chronic kidney disease: Secondary | ICD-10-CM | POA: Diagnosis not present

## 2018-10-18 DIAGNOSIS — N2581 Secondary hyperparathyroidism of renal origin: Secondary | ICD-10-CM | POA: Diagnosis not present

## 2018-10-19 DIAGNOSIS — Z992 Dependence on renal dialysis: Secondary | ICD-10-CM | POA: Diagnosis not present

## 2018-10-19 DIAGNOSIS — D631 Anemia in chronic kidney disease: Secondary | ICD-10-CM | POA: Diagnosis not present

## 2018-10-19 DIAGNOSIS — N2581 Secondary hyperparathyroidism of renal origin: Secondary | ICD-10-CM | POA: Diagnosis not present

## 2018-10-19 DIAGNOSIS — N186 End stage renal disease: Secondary | ICD-10-CM | POA: Diagnosis not present

## 2018-10-20 DIAGNOSIS — N2581 Secondary hyperparathyroidism of renal origin: Secondary | ICD-10-CM | POA: Diagnosis not present

## 2018-10-20 DIAGNOSIS — N186 End stage renal disease: Secondary | ICD-10-CM | POA: Diagnosis not present

## 2018-10-20 DIAGNOSIS — Z992 Dependence on renal dialysis: Secondary | ICD-10-CM | POA: Diagnosis not present

## 2018-10-20 DIAGNOSIS — D631 Anemia in chronic kidney disease: Secondary | ICD-10-CM | POA: Diagnosis not present

## 2018-10-21 DIAGNOSIS — N186 End stage renal disease: Secondary | ICD-10-CM | POA: Diagnosis not present

## 2018-10-21 DIAGNOSIS — N2581 Secondary hyperparathyroidism of renal origin: Secondary | ICD-10-CM | POA: Diagnosis not present

## 2018-10-21 DIAGNOSIS — D631 Anemia in chronic kidney disease: Secondary | ICD-10-CM | POA: Diagnosis not present

## 2018-10-21 DIAGNOSIS — Z992 Dependence on renal dialysis: Secondary | ICD-10-CM | POA: Diagnosis not present

## 2018-10-22 DIAGNOSIS — N2581 Secondary hyperparathyroidism of renal origin: Secondary | ICD-10-CM | POA: Diagnosis not present

## 2018-10-22 DIAGNOSIS — Z992 Dependence on renal dialysis: Secondary | ICD-10-CM | POA: Diagnosis not present

## 2018-10-22 DIAGNOSIS — D631 Anemia in chronic kidney disease: Secondary | ICD-10-CM | POA: Diagnosis not present

## 2018-10-22 DIAGNOSIS — N186 End stage renal disease: Secondary | ICD-10-CM | POA: Diagnosis not present

## 2018-10-23 DIAGNOSIS — N186 End stage renal disease: Secondary | ICD-10-CM | POA: Diagnosis not present

## 2018-10-23 DIAGNOSIS — N2581 Secondary hyperparathyroidism of renal origin: Secondary | ICD-10-CM | POA: Diagnosis not present

## 2018-10-23 DIAGNOSIS — D631 Anemia in chronic kidney disease: Secondary | ICD-10-CM | POA: Diagnosis not present

## 2018-10-24 DIAGNOSIS — N2581 Secondary hyperparathyroidism of renal origin: Secondary | ICD-10-CM | POA: Diagnosis not present

## 2018-10-24 DIAGNOSIS — D631 Anemia in chronic kidney disease: Secondary | ICD-10-CM | POA: Diagnosis not present

## 2018-10-24 DIAGNOSIS — N186 End stage renal disease: Secondary | ICD-10-CM | POA: Diagnosis not present

## 2018-10-25 DIAGNOSIS — D631 Anemia in chronic kidney disease: Secondary | ICD-10-CM | POA: Diagnosis not present

## 2018-10-25 DIAGNOSIS — N2581 Secondary hyperparathyroidism of renal origin: Secondary | ICD-10-CM | POA: Diagnosis not present

## 2018-10-25 DIAGNOSIS — N186 End stage renal disease: Secondary | ICD-10-CM | POA: Diagnosis not present

## 2018-10-26 DIAGNOSIS — N2581 Secondary hyperparathyroidism of renal origin: Secondary | ICD-10-CM | POA: Diagnosis not present

## 2018-10-26 DIAGNOSIS — D631 Anemia in chronic kidney disease: Secondary | ICD-10-CM | POA: Diagnosis not present

## 2018-10-26 DIAGNOSIS — N186 End stage renal disease: Secondary | ICD-10-CM | POA: Diagnosis not present

## 2018-10-27 DIAGNOSIS — N2581 Secondary hyperparathyroidism of renal origin: Secondary | ICD-10-CM | POA: Diagnosis not present

## 2018-10-27 DIAGNOSIS — D631 Anemia in chronic kidney disease: Secondary | ICD-10-CM | POA: Diagnosis not present

## 2018-10-27 DIAGNOSIS — N186 End stage renal disease: Secondary | ICD-10-CM | POA: Diagnosis not present

## 2018-10-28 DIAGNOSIS — Z4932 Encounter for adequacy testing for peritoneal dialysis: Secondary | ICD-10-CM | POA: Diagnosis not present

## 2018-10-28 DIAGNOSIS — N186 End stage renal disease: Secondary | ICD-10-CM | POA: Diagnosis not present

## 2018-10-28 DIAGNOSIS — D631 Anemia in chronic kidney disease: Secondary | ICD-10-CM | POA: Diagnosis not present

## 2018-10-28 DIAGNOSIS — N2581 Secondary hyperparathyroidism of renal origin: Secondary | ICD-10-CM | POA: Diagnosis not present

## 2018-10-29 DIAGNOSIS — N2581 Secondary hyperparathyroidism of renal origin: Secondary | ICD-10-CM | POA: Diagnosis not present

## 2018-10-29 DIAGNOSIS — N186 End stage renal disease: Secondary | ICD-10-CM | POA: Diagnosis not present

## 2018-10-29 DIAGNOSIS — D631 Anemia in chronic kidney disease: Secondary | ICD-10-CM | POA: Diagnosis not present

## 2018-10-30 DIAGNOSIS — N186 End stage renal disease: Secondary | ICD-10-CM | POA: Diagnosis not present

## 2018-10-30 DIAGNOSIS — N2581 Secondary hyperparathyroidism of renal origin: Secondary | ICD-10-CM | POA: Diagnosis not present

## 2018-10-30 DIAGNOSIS — D631 Anemia in chronic kidney disease: Secondary | ICD-10-CM | POA: Diagnosis not present

## 2018-10-31 DIAGNOSIS — F29 Unspecified psychosis not due to a substance or known physiological condition: Secondary | ICD-10-CM | POA: Diagnosis not present

## 2018-10-31 DIAGNOSIS — N186 End stage renal disease: Secondary | ICD-10-CM | POA: Diagnosis not present

## 2018-10-31 DIAGNOSIS — F71 Moderate intellectual disabilities: Secondary | ICD-10-CM | POA: Diagnosis not present

## 2018-10-31 DIAGNOSIS — N2581 Secondary hyperparathyroidism of renal origin: Secondary | ICD-10-CM | POA: Diagnosis not present

## 2018-10-31 DIAGNOSIS — D631 Anemia in chronic kidney disease: Secondary | ICD-10-CM | POA: Diagnosis not present

## 2018-11-01 DIAGNOSIS — N186 End stage renal disease: Secondary | ICD-10-CM | POA: Diagnosis not present

## 2018-11-01 DIAGNOSIS — N2581 Secondary hyperparathyroidism of renal origin: Secondary | ICD-10-CM | POA: Diagnosis not present

## 2018-11-01 DIAGNOSIS — D631 Anemia in chronic kidney disease: Secondary | ICD-10-CM | POA: Diagnosis not present

## 2018-11-02 DIAGNOSIS — N2581 Secondary hyperparathyroidism of renal origin: Secondary | ICD-10-CM | POA: Diagnosis not present

## 2018-11-02 DIAGNOSIS — N186 End stage renal disease: Secondary | ICD-10-CM | POA: Diagnosis not present

## 2018-11-02 DIAGNOSIS — D631 Anemia in chronic kidney disease: Secondary | ICD-10-CM | POA: Diagnosis not present

## 2018-11-03 DIAGNOSIS — N186 End stage renal disease: Secondary | ICD-10-CM | POA: Diagnosis not present

## 2018-11-03 DIAGNOSIS — N2581 Secondary hyperparathyroidism of renal origin: Secondary | ICD-10-CM | POA: Diagnosis not present

## 2018-11-03 DIAGNOSIS — D631 Anemia in chronic kidney disease: Secondary | ICD-10-CM | POA: Diagnosis not present

## 2018-11-04 ENCOUNTER — Telehealth: Payer: Self-pay | Admitting: Hematology

## 2018-11-04 DIAGNOSIS — N186 End stage renal disease: Secondary | ICD-10-CM | POA: Diagnosis not present

## 2018-11-04 DIAGNOSIS — D631 Anemia in chronic kidney disease: Secondary | ICD-10-CM | POA: Diagnosis not present

## 2018-11-04 DIAGNOSIS — N2581 Secondary hyperparathyroidism of renal origin: Secondary | ICD-10-CM | POA: Diagnosis not present

## 2018-11-04 NOTE — Telephone Encounter (Signed)
lmom for patient to return call to office re new patient appt. Mailed appt letter for 11/26/18 at 12 pm

## 2018-11-05 DIAGNOSIS — N186 End stage renal disease: Secondary | ICD-10-CM | POA: Diagnosis not present

## 2018-11-05 DIAGNOSIS — D631 Anemia in chronic kidney disease: Secondary | ICD-10-CM | POA: Diagnosis not present

## 2018-11-05 DIAGNOSIS — E782 Mixed hyperlipidemia: Secondary | ICD-10-CM | POA: Diagnosis not present

## 2018-11-05 DIAGNOSIS — E039 Hypothyroidism, unspecified: Secondary | ICD-10-CM | POA: Diagnosis not present

## 2018-11-05 DIAGNOSIS — N2581 Secondary hyperparathyroidism of renal origin: Secondary | ICD-10-CM | POA: Diagnosis not present

## 2018-11-05 DIAGNOSIS — R569 Unspecified convulsions: Secondary | ICD-10-CM | POA: Diagnosis not present

## 2018-11-05 DIAGNOSIS — R358 Other polyuria: Secondary | ICD-10-CM | POA: Diagnosis not present

## 2018-11-05 DIAGNOSIS — D539 Nutritional anemia, unspecified: Secondary | ICD-10-CM | POA: Diagnosis not present

## 2018-11-06 DIAGNOSIS — N186 End stage renal disease: Secondary | ICD-10-CM | POA: Diagnosis not present

## 2018-11-06 DIAGNOSIS — D631 Anemia in chronic kidney disease: Secondary | ICD-10-CM | POA: Diagnosis not present

## 2018-11-06 DIAGNOSIS — N2581 Secondary hyperparathyroidism of renal origin: Secondary | ICD-10-CM | POA: Diagnosis not present

## 2018-11-07 DIAGNOSIS — N2581 Secondary hyperparathyroidism of renal origin: Secondary | ICD-10-CM | POA: Diagnosis not present

## 2018-11-07 DIAGNOSIS — D631 Anemia in chronic kidney disease: Secondary | ICD-10-CM | POA: Diagnosis not present

## 2018-11-07 DIAGNOSIS — N186 End stage renal disease: Secondary | ICD-10-CM | POA: Diagnosis not present

## 2018-11-08 ENCOUNTER — Other Ambulatory Visit: Payer: Self-pay | Admitting: Internal Medicine

## 2018-11-08 DIAGNOSIS — Z1231 Encounter for screening mammogram for malignant neoplasm of breast: Secondary | ICD-10-CM

## 2018-11-08 DIAGNOSIS — N186 End stage renal disease: Secondary | ICD-10-CM | POA: Diagnosis not present

## 2018-11-08 DIAGNOSIS — N2581 Secondary hyperparathyroidism of renal origin: Secondary | ICD-10-CM | POA: Diagnosis not present

## 2018-11-08 DIAGNOSIS — D631 Anemia in chronic kidney disease: Secondary | ICD-10-CM | POA: Diagnosis not present

## 2018-11-09 DIAGNOSIS — N2581 Secondary hyperparathyroidism of renal origin: Secondary | ICD-10-CM | POA: Diagnosis not present

## 2018-11-09 DIAGNOSIS — N186 End stage renal disease: Secondary | ICD-10-CM | POA: Diagnosis not present

## 2018-11-09 DIAGNOSIS — D631 Anemia in chronic kidney disease: Secondary | ICD-10-CM | POA: Diagnosis not present

## 2018-11-10 DIAGNOSIS — D631 Anemia in chronic kidney disease: Secondary | ICD-10-CM | POA: Diagnosis not present

## 2018-11-10 DIAGNOSIS — N186 End stage renal disease: Secondary | ICD-10-CM | POA: Diagnosis not present

## 2018-11-10 DIAGNOSIS — N2581 Secondary hyperparathyroidism of renal origin: Secondary | ICD-10-CM | POA: Diagnosis not present

## 2018-11-11 DIAGNOSIS — N186 End stage renal disease: Secondary | ICD-10-CM | POA: Diagnosis not present

## 2018-11-11 DIAGNOSIS — D631 Anemia in chronic kidney disease: Secondary | ICD-10-CM | POA: Diagnosis not present

## 2018-11-11 DIAGNOSIS — N2581 Secondary hyperparathyroidism of renal origin: Secondary | ICD-10-CM | POA: Diagnosis not present

## 2018-11-12 DIAGNOSIS — D631 Anemia in chronic kidney disease: Secondary | ICD-10-CM | POA: Diagnosis not present

## 2018-11-12 DIAGNOSIS — N186 End stage renal disease: Secondary | ICD-10-CM | POA: Diagnosis not present

## 2018-11-12 DIAGNOSIS — N2581 Secondary hyperparathyroidism of renal origin: Secondary | ICD-10-CM | POA: Diagnosis not present

## 2018-11-13 DIAGNOSIS — N2581 Secondary hyperparathyroidism of renal origin: Secondary | ICD-10-CM | POA: Diagnosis not present

## 2018-11-13 DIAGNOSIS — D631 Anemia in chronic kidney disease: Secondary | ICD-10-CM | POA: Diagnosis not present

## 2018-11-13 DIAGNOSIS — N186 End stage renal disease: Secondary | ICD-10-CM | POA: Diagnosis not present

## 2018-11-14 DIAGNOSIS — N186 End stage renal disease: Secondary | ICD-10-CM | POA: Diagnosis not present

## 2018-11-14 DIAGNOSIS — N2581 Secondary hyperparathyroidism of renal origin: Secondary | ICD-10-CM | POA: Diagnosis not present

## 2018-11-14 DIAGNOSIS — D631 Anemia in chronic kidney disease: Secondary | ICD-10-CM | POA: Diagnosis not present

## 2018-11-15 DIAGNOSIS — N2581 Secondary hyperparathyroidism of renal origin: Secondary | ICD-10-CM | POA: Diagnosis not present

## 2018-11-15 DIAGNOSIS — D631 Anemia in chronic kidney disease: Secondary | ICD-10-CM | POA: Diagnosis not present

## 2018-11-15 DIAGNOSIS — N186 End stage renal disease: Secondary | ICD-10-CM | POA: Diagnosis not present

## 2018-11-16 DIAGNOSIS — D631 Anemia in chronic kidney disease: Secondary | ICD-10-CM | POA: Diagnosis not present

## 2018-11-16 DIAGNOSIS — N2581 Secondary hyperparathyroidism of renal origin: Secondary | ICD-10-CM | POA: Diagnosis not present

## 2018-11-16 DIAGNOSIS — N186 End stage renal disease: Secondary | ICD-10-CM | POA: Diagnosis not present

## 2018-11-17 DIAGNOSIS — D631 Anemia in chronic kidney disease: Secondary | ICD-10-CM | POA: Diagnosis not present

## 2018-11-17 DIAGNOSIS — N2581 Secondary hyperparathyroidism of renal origin: Secondary | ICD-10-CM | POA: Diagnosis not present

## 2018-11-17 DIAGNOSIS — N186 End stage renal disease: Secondary | ICD-10-CM | POA: Diagnosis not present

## 2018-11-18 DIAGNOSIS — D631 Anemia in chronic kidney disease: Secondary | ICD-10-CM | POA: Diagnosis not present

## 2018-11-18 DIAGNOSIS — N2581 Secondary hyperparathyroidism of renal origin: Secondary | ICD-10-CM | POA: Diagnosis not present

## 2018-11-18 DIAGNOSIS — N186 End stage renal disease: Secondary | ICD-10-CM | POA: Diagnosis not present

## 2018-11-19 DIAGNOSIS — D631 Anemia in chronic kidney disease: Secondary | ICD-10-CM | POA: Diagnosis not present

## 2018-11-19 DIAGNOSIS — N2581 Secondary hyperparathyroidism of renal origin: Secondary | ICD-10-CM | POA: Diagnosis not present

## 2018-11-19 DIAGNOSIS — N186 End stage renal disease: Secondary | ICD-10-CM | POA: Diagnosis not present

## 2018-11-20 DIAGNOSIS — N2581 Secondary hyperparathyroidism of renal origin: Secondary | ICD-10-CM | POA: Diagnosis not present

## 2018-11-20 DIAGNOSIS — N186 End stage renal disease: Secondary | ICD-10-CM | POA: Diagnosis not present

## 2018-11-20 DIAGNOSIS — D631 Anemia in chronic kidney disease: Secondary | ICD-10-CM | POA: Diagnosis not present

## 2018-11-21 DIAGNOSIS — N2581 Secondary hyperparathyroidism of renal origin: Secondary | ICD-10-CM | POA: Diagnosis not present

## 2018-11-21 DIAGNOSIS — D631 Anemia in chronic kidney disease: Secondary | ICD-10-CM | POA: Diagnosis not present

## 2018-11-21 DIAGNOSIS — N186 End stage renal disease: Secondary | ICD-10-CM | POA: Diagnosis not present

## 2018-11-22 DIAGNOSIS — N2581 Secondary hyperparathyroidism of renal origin: Secondary | ICD-10-CM | POA: Diagnosis not present

## 2018-11-22 DIAGNOSIS — N186 End stage renal disease: Secondary | ICD-10-CM | POA: Diagnosis not present

## 2018-11-22 DIAGNOSIS — D631 Anemia in chronic kidney disease: Secondary | ICD-10-CM | POA: Diagnosis not present

## 2018-11-22 DIAGNOSIS — Z992 Dependence on renal dialysis: Secondary | ICD-10-CM | POA: Diagnosis not present

## 2018-11-23 DIAGNOSIS — D631 Anemia in chronic kidney disease: Secondary | ICD-10-CM | POA: Diagnosis not present

## 2018-11-23 DIAGNOSIS — Z23 Encounter for immunization: Secondary | ICD-10-CM | POA: Diagnosis not present

## 2018-11-23 DIAGNOSIS — N186 End stage renal disease: Secondary | ICD-10-CM | POA: Diagnosis not present

## 2018-11-23 DIAGNOSIS — N2581 Secondary hyperparathyroidism of renal origin: Secondary | ICD-10-CM | POA: Diagnosis not present

## 2018-11-23 DIAGNOSIS — T8571XA Infection and inflammatory reaction due to peritoneal dialysis catheter, initial encounter: Secondary | ICD-10-CM | POA: Diagnosis not present

## 2018-11-24 DIAGNOSIS — T8571XA Infection and inflammatory reaction due to peritoneal dialysis catheter, initial encounter: Secondary | ICD-10-CM | POA: Diagnosis not present

## 2018-11-24 DIAGNOSIS — D631 Anemia in chronic kidney disease: Secondary | ICD-10-CM | POA: Diagnosis not present

## 2018-11-24 DIAGNOSIS — N186 End stage renal disease: Secondary | ICD-10-CM | POA: Diagnosis not present

## 2018-11-24 DIAGNOSIS — Z23 Encounter for immunization: Secondary | ICD-10-CM | POA: Diagnosis not present

## 2018-11-24 DIAGNOSIS — N2581 Secondary hyperparathyroidism of renal origin: Secondary | ICD-10-CM | POA: Diagnosis not present

## 2018-11-25 DIAGNOSIS — N2581 Secondary hyperparathyroidism of renal origin: Secondary | ICD-10-CM | POA: Diagnosis not present

## 2018-11-25 DIAGNOSIS — D631 Anemia in chronic kidney disease: Secondary | ICD-10-CM | POA: Diagnosis not present

## 2018-11-25 DIAGNOSIS — Z23 Encounter for immunization: Secondary | ICD-10-CM | POA: Diagnosis not present

## 2018-11-25 DIAGNOSIS — N186 End stage renal disease: Secondary | ICD-10-CM | POA: Diagnosis not present

## 2018-11-25 DIAGNOSIS — T8571XA Infection and inflammatory reaction due to peritoneal dialysis catheter, initial encounter: Secondary | ICD-10-CM | POA: Diagnosis not present

## 2018-11-26 ENCOUNTER — Ambulatory Visit: Payer: Medicare Other | Admitting: Hematology

## 2018-11-26 ENCOUNTER — Inpatient Hospital Stay: Payer: Medicare Other

## 2018-11-26 DIAGNOSIS — N2581 Secondary hyperparathyroidism of renal origin: Secondary | ICD-10-CM | POA: Diagnosis not present

## 2018-11-26 DIAGNOSIS — Z23 Encounter for immunization: Secondary | ICD-10-CM | POA: Diagnosis not present

## 2018-11-26 DIAGNOSIS — T8571XA Infection and inflammatory reaction due to peritoneal dialysis catheter, initial encounter: Secondary | ICD-10-CM | POA: Diagnosis not present

## 2018-11-26 DIAGNOSIS — D631 Anemia in chronic kidney disease: Secondary | ICD-10-CM | POA: Diagnosis not present

## 2018-11-26 DIAGNOSIS — N186 End stage renal disease: Secondary | ICD-10-CM | POA: Diagnosis not present

## 2018-11-27 DIAGNOSIS — Z23 Encounter for immunization: Secondary | ICD-10-CM | POA: Diagnosis not present

## 2018-11-27 DIAGNOSIS — D631 Anemia in chronic kidney disease: Secondary | ICD-10-CM | POA: Diagnosis not present

## 2018-11-27 DIAGNOSIS — T8571XA Infection and inflammatory reaction due to peritoneal dialysis catheter, initial encounter: Secondary | ICD-10-CM | POA: Diagnosis not present

## 2018-11-27 DIAGNOSIS — N2581 Secondary hyperparathyroidism of renal origin: Secondary | ICD-10-CM | POA: Diagnosis not present

## 2018-11-27 DIAGNOSIS — N186 End stage renal disease: Secondary | ICD-10-CM | POA: Diagnosis not present

## 2018-11-28 DIAGNOSIS — N2581 Secondary hyperparathyroidism of renal origin: Secondary | ICD-10-CM | POA: Diagnosis not present

## 2018-11-28 DIAGNOSIS — Z23 Encounter for immunization: Secondary | ICD-10-CM | POA: Diagnosis not present

## 2018-11-28 DIAGNOSIS — D631 Anemia in chronic kidney disease: Secondary | ICD-10-CM | POA: Diagnosis not present

## 2018-11-28 DIAGNOSIS — N186 End stage renal disease: Secondary | ICD-10-CM | POA: Diagnosis not present

## 2018-11-28 DIAGNOSIS — T8571XA Infection and inflammatory reaction due to peritoneal dialysis catheter, initial encounter: Secondary | ICD-10-CM | POA: Diagnosis not present

## 2018-11-29 DIAGNOSIS — D631 Anemia in chronic kidney disease: Secondary | ICD-10-CM | POA: Diagnosis not present

## 2018-11-29 DIAGNOSIS — N186 End stage renal disease: Secondary | ICD-10-CM | POA: Diagnosis not present

## 2018-11-29 DIAGNOSIS — Z23 Encounter for immunization: Secondary | ICD-10-CM | POA: Diagnosis not present

## 2018-11-29 DIAGNOSIS — N2581 Secondary hyperparathyroidism of renal origin: Secondary | ICD-10-CM | POA: Diagnosis not present

## 2018-11-29 DIAGNOSIS — T8571XA Infection and inflammatory reaction due to peritoneal dialysis catheter, initial encounter: Secondary | ICD-10-CM | POA: Diagnosis not present

## 2018-11-30 DIAGNOSIS — D631 Anemia in chronic kidney disease: Secondary | ICD-10-CM | POA: Diagnosis not present

## 2018-11-30 DIAGNOSIS — Z23 Encounter for immunization: Secondary | ICD-10-CM | POA: Diagnosis not present

## 2018-11-30 DIAGNOSIS — N186 End stage renal disease: Secondary | ICD-10-CM | POA: Diagnosis not present

## 2018-11-30 DIAGNOSIS — T8571XA Infection and inflammatory reaction due to peritoneal dialysis catheter, initial encounter: Secondary | ICD-10-CM | POA: Diagnosis not present

## 2018-11-30 DIAGNOSIS — N2581 Secondary hyperparathyroidism of renal origin: Secondary | ICD-10-CM | POA: Diagnosis not present

## 2018-12-01 DIAGNOSIS — N186 End stage renal disease: Secondary | ICD-10-CM | POA: Diagnosis not present

## 2018-12-01 DIAGNOSIS — Z23 Encounter for immunization: Secondary | ICD-10-CM | POA: Diagnosis not present

## 2018-12-01 DIAGNOSIS — T8571XA Infection and inflammatory reaction due to peritoneal dialysis catheter, initial encounter: Secondary | ICD-10-CM | POA: Diagnosis not present

## 2018-12-01 DIAGNOSIS — N2581 Secondary hyperparathyroidism of renal origin: Secondary | ICD-10-CM | POA: Diagnosis not present

## 2018-12-01 DIAGNOSIS — D631 Anemia in chronic kidney disease: Secondary | ICD-10-CM | POA: Diagnosis not present

## 2018-12-02 DIAGNOSIS — N2581 Secondary hyperparathyroidism of renal origin: Secondary | ICD-10-CM | POA: Diagnosis not present

## 2018-12-02 DIAGNOSIS — T8571XA Infection and inflammatory reaction due to peritoneal dialysis catheter, initial encounter: Secondary | ICD-10-CM | POA: Diagnosis not present

## 2018-12-02 DIAGNOSIS — Z23 Encounter for immunization: Secondary | ICD-10-CM | POA: Diagnosis not present

## 2018-12-02 DIAGNOSIS — D631 Anemia in chronic kidney disease: Secondary | ICD-10-CM | POA: Diagnosis not present

## 2018-12-02 DIAGNOSIS — N186 End stage renal disease: Secondary | ICD-10-CM | POA: Diagnosis not present

## 2018-12-03 DIAGNOSIS — D631 Anemia in chronic kidney disease: Secondary | ICD-10-CM | POA: Diagnosis not present

## 2018-12-03 DIAGNOSIS — N2581 Secondary hyperparathyroidism of renal origin: Secondary | ICD-10-CM | POA: Diagnosis not present

## 2018-12-03 DIAGNOSIS — Z0001 Encounter for general adult medical examination with abnormal findings: Secondary | ICD-10-CM | POA: Diagnosis not present

## 2018-12-03 DIAGNOSIS — D539 Nutritional anemia, unspecified: Secondary | ICD-10-CM | POA: Diagnosis not present

## 2018-12-03 DIAGNOSIS — T8571XA Infection and inflammatory reaction due to peritoneal dialysis catheter, initial encounter: Secondary | ICD-10-CM | POA: Diagnosis not present

## 2018-12-03 DIAGNOSIS — E782 Mixed hyperlipidemia: Secondary | ICD-10-CM | POA: Diagnosis not present

## 2018-12-03 DIAGNOSIS — Z23 Encounter for immunization: Secondary | ICD-10-CM | POA: Diagnosis not present

## 2018-12-03 DIAGNOSIS — E039 Hypothyroidism, unspecified: Secondary | ICD-10-CM | POA: Diagnosis not present

## 2018-12-03 DIAGNOSIS — N186 End stage renal disease: Secondary | ICD-10-CM | POA: Diagnosis not present

## 2018-12-03 DIAGNOSIS — R569 Unspecified convulsions: Secondary | ICD-10-CM | POA: Diagnosis not present

## 2018-12-04 DIAGNOSIS — T8571XA Infection and inflammatory reaction due to peritoneal dialysis catheter, initial encounter: Secondary | ICD-10-CM | POA: Diagnosis not present

## 2018-12-04 DIAGNOSIS — N2581 Secondary hyperparathyroidism of renal origin: Secondary | ICD-10-CM | POA: Diagnosis not present

## 2018-12-04 DIAGNOSIS — Z23 Encounter for immunization: Secondary | ICD-10-CM | POA: Diagnosis not present

## 2018-12-04 DIAGNOSIS — N186 End stage renal disease: Secondary | ICD-10-CM | POA: Diagnosis not present

## 2018-12-04 DIAGNOSIS — D631 Anemia in chronic kidney disease: Secondary | ICD-10-CM | POA: Diagnosis not present

## 2018-12-05 DIAGNOSIS — N2581 Secondary hyperparathyroidism of renal origin: Secondary | ICD-10-CM | POA: Diagnosis not present

## 2018-12-05 DIAGNOSIS — D631 Anemia in chronic kidney disease: Secondary | ICD-10-CM | POA: Diagnosis not present

## 2018-12-05 DIAGNOSIS — T8571XA Infection and inflammatory reaction due to peritoneal dialysis catheter, initial encounter: Secondary | ICD-10-CM | POA: Diagnosis not present

## 2018-12-05 DIAGNOSIS — Z23 Encounter for immunization: Secondary | ICD-10-CM | POA: Diagnosis not present

## 2018-12-05 DIAGNOSIS — N186 End stage renal disease: Secondary | ICD-10-CM | POA: Diagnosis not present

## 2018-12-06 ENCOUNTER — Other Ambulatory Visit: Payer: Self-pay | Admitting: Hematology

## 2018-12-06 DIAGNOSIS — D7589 Other specified diseases of blood and blood-forming organs: Secondary | ICD-10-CM

## 2018-12-06 DIAGNOSIS — Z23 Encounter for immunization: Secondary | ICD-10-CM | POA: Diagnosis not present

## 2018-12-06 DIAGNOSIS — N2581 Secondary hyperparathyroidism of renal origin: Secondary | ICD-10-CM | POA: Diagnosis not present

## 2018-12-06 DIAGNOSIS — D539 Nutritional anemia, unspecified: Secondary | ICD-10-CM

## 2018-12-06 DIAGNOSIS — D649 Anemia, unspecified: Secondary | ICD-10-CM | POA: Insufficient documentation

## 2018-12-06 DIAGNOSIS — N186 End stage renal disease: Secondary | ICD-10-CM | POA: Diagnosis not present

## 2018-12-06 DIAGNOSIS — D631 Anemia in chronic kidney disease: Secondary | ICD-10-CM | POA: Diagnosis not present

## 2018-12-06 DIAGNOSIS — T8571XA Infection and inflammatory reaction due to peritoneal dialysis catheter, initial encounter: Secondary | ICD-10-CM | POA: Diagnosis not present

## 2018-12-07 DIAGNOSIS — N186 End stage renal disease: Secondary | ICD-10-CM | POA: Diagnosis not present

## 2018-12-07 DIAGNOSIS — Z23 Encounter for immunization: Secondary | ICD-10-CM | POA: Diagnosis not present

## 2018-12-07 DIAGNOSIS — D631 Anemia in chronic kidney disease: Secondary | ICD-10-CM | POA: Diagnosis not present

## 2018-12-07 DIAGNOSIS — T8571XA Infection and inflammatory reaction due to peritoneal dialysis catheter, initial encounter: Secondary | ICD-10-CM | POA: Diagnosis not present

## 2018-12-07 DIAGNOSIS — N2581 Secondary hyperparathyroidism of renal origin: Secondary | ICD-10-CM | POA: Diagnosis not present

## 2018-12-08 DIAGNOSIS — N186 End stage renal disease: Secondary | ICD-10-CM | POA: Diagnosis not present

## 2018-12-08 DIAGNOSIS — Z23 Encounter for immunization: Secondary | ICD-10-CM | POA: Diagnosis not present

## 2018-12-08 DIAGNOSIS — N2581 Secondary hyperparathyroidism of renal origin: Secondary | ICD-10-CM | POA: Diagnosis not present

## 2018-12-08 DIAGNOSIS — T8571XA Infection and inflammatory reaction due to peritoneal dialysis catheter, initial encounter: Secondary | ICD-10-CM | POA: Diagnosis not present

## 2018-12-08 DIAGNOSIS — D631 Anemia in chronic kidney disease: Secondary | ICD-10-CM | POA: Diagnosis not present

## 2018-12-08 NOTE — Progress Notes (Signed)
[place] CONSULT NOTE  Patient Care Team: [name], MD as PCP - General (Internal Medicine)  HEME/ONC OVERVIEW: 1. Macrocytic anemia -Hgb mid-7 to 8's since 2018  PERTINENT NON-HEM/ONC PROBLEMS: 1. ESRD on peritoneal dialysis   -Followed by Dr. Jolinda Croak of nephrology at [place] between q2weeks and monthly  ASSESSMENT & PLAN:   Macrocytic anemia -I reviewed the patient's external records in detail, including PCP and nephrology clinic notes and lab studies -In summary, patient has had longstanding ESRD for which she has been on peritoneal dialysis. Her Hgb has been slowly downtrending since 2016, and has stabilized around mid-7 to 8's since 2018. Nutrition studies unremarkable except ferritin ~1100, consistent with anemia of chronic disease. Pt was most recently seen by Dr. Jolinda Croak of nephrology at the [facility] in early 11/2018, and labs were notable for Hgb 7.2.  Per the patient's brother-in-law, she has been receiving Aranesp injection at [facility] from every 2 weeks to monthly, and her Hgb appears to respond appropriately to the injection. Her last injection in early 11/2018. -Clinically, patient denies any symptoms of abnormal bleeding, such as hematochezia or melena -Hgb 7.5 today, stable comparing to labs from [facility] in early 11/2018 -I personally reviewed the patient's peripheral blood smear, which showed mildly macrocytic RBC's but the morphology appeared normal without any schistocytosis. Normal WBC and platelet morphology, no dysplastic changes.  -I have ordered repeat nutritional studies, including iron, B12, folate, and [place]; given ESRD, I have also ordered Epo level -In the setting of longstanding ESRD on peritoneal dialysis, the etiology of macrocytic anemia is most likely secondary to underlying renal dysfunction -MDS less likely, given the absence of leukopenia or thrombocytopenia -Patient is followed closely by Dr. Jolinda Croak of  nephrology at [facility] every 2 weeks, and has been receiving Aranesp injection from every 2 weeks to monthly; as her hemoglobin appears to respond appropriately to ESA, and she is seen on a regular basis by nephrology, I recommend the patient to continue Aranesp injection at [facility], and may need to increase Aranesp injection frequency, given the persistently low hemoglobin -I will plan to see the patient every 6 months to assess for presence of other cytopenia; if she develops progressive leukopenia or thrombocytopenia, or becomes RBC transfusion dependent despite Aranesp injection, then we may consider bone marrow biopsy to rule out any underlying bone marrow disorder -I also encouraged the patient to keep up-to-date with age appropriate cancer screening; per the patient's brother-in-law, she had colonoscopy in 08/2017 at [facility] that was normal, but the report was not available for review  ESRD -On peritoneal dialysis for at least 4-5 years -Patient is followed by Dr. Jolinda Croak at [facility], and has been receiving Aranesp injection there -To minimize the number of clinic visits, given the patient's baseline cognitive deficit, and to avoid accidental over-administration of Aranesp, I recommend the patient to continue injection at [facility], as she has appts every 2 weeks there -Continue PD per nephrology  Orders Placed This Encounter  Procedures  . CBC with Differential (Cancer Center Only)    Standing Status:   Future    Standing Expiration Date:   01/13/2020  . CMP ([place] only)    Standing Status:   Future    Standing Expiration Date:   01/13/2020  . Save Smear (SSMR)    Standing Status:   Future    Standing Expiration Date:   12/10/2019  . Erythropoietin    Standing  Status:   Future    Standing Expiration Date:   12/09/2019   All questions were answered. The patient knows to call the clinic with any problems, questions or concerns.  Return in 6 months for labs and  clinic follow-up.   [name], MD 12/09/2018 9:23 AM   CHIEF COMPLAINTS/PURPOSE OF CONSULTATION:  "I don't know why I am here"  HISTORY OF PRESENTING ILLNESS:  Jane [name] 53 y.o. female is here because of macrocytic anemia.  Patient has baseline cognitive impairment, and the history was obtained primarily from the patient's brother-in-law and via chart review.  Patient has been on peritoneal dialysis for ESRD for the past 4 to 5 years.  She is seen every 2 weeks by nephrology at [facility], and has been receiving Aranesp injections anywhere from every 2 weeks to monthly, depending on her Hgb level.  Per the patient's brother-in-law, she appears to respond to Aranesp injection and sometimes skips her Aranesp injection due to high hemoglobin.  Patient reports that she has mild periumbilical comfort, which her brother-in-law attributes to constipation, but she otherwise denies any other symptoms, such as fever, chill, chest pain, dyspnea, hematemesis, hemoptysis, nausea, vomiting, diarrhea, hematochezia or melena.   I have reviewed her chart and materials related to her cancer extensively and collaborated history with the patient. Summary of oncologic history is as follows:  No history exists.    MEDICAL HISTORY:  Past Medical History:  Diagnosis Date  . Anemia   . Bipolar disorder ([place])   . Depression   . ESRD on peritoneal dialysis ([place])   . Hyperlipemia   . Hypertension   . Hypothyroidism   . Moderately mentally retarded   . Renal disease   . Schizophrenia ([place])   . Seizures ([place])    No recent seizures - no meds    SURGICAL HISTORY: Past Surgical History:  Procedure Laterality Date  . BREAST REDUCTION SURGERY    . BREAST SURGERY     breast reduction  . EXAMINATION UNDER ANESTHESIA N/A 12/08/2013   Procedure: EXAM UNDER ANESTHESIA;  Surgeon: Lavonia Drafts, MD;  Location: [facility];  Service: Gynecology;  Laterality: N/A;  Pelvic exam and pap smear, unable to tolerate  during last office visit  . FOOT SURGERY     right    SOCIAL HISTORY: Social History   Socioeconomic History  . Marital status: Single    Spouse name: Not on file  . Number of children: Not on file  . Years of education: Not on file  . Highest education level: Not on file  Occupational History  . Not on file  Social Needs  . Financial resource strain: Not on file  . Food insecurity:    Worry: Not on file    Inability: Not on file  . Transportation needs:    Medical: Not on file    Non-medical: Not on file  Tobacco Use  . Smoking status: Never Smoker  . Smokeless tobacco: Never Used  Substance and Sexual Activity  . Alcohol use: No  . Drug use: No  . Sexual activity: Never    Birth control/protection: Injection  Lifestyle  . Physical activity:    Days per week: Not on file    Minutes per session: Not on file  . Stress: Not on file  Relationships  . Social connections:    Talks on phone: Not on file    Gets together: Not on file    Attends religious service: Not on file  Active member of club or organization: Not on file    Attends meetings of clubs or organizations: Not on file    Relationship status: Not on file  . Intimate partner violence:    Fear of current or ex partner: Not on file    Emotionally abused: Not on file    Physically abused: Not on file    Forced sexual activity: Not on file  Other Topics Concern  . Not on file  Social History Narrative  . Not on file    FAMILY HISTORY: Family History  Problem Relation Age of Onset  . Hypertension Other     ALLERGIES:  has No Known Allergies.  MEDICATIONS:  Current Outpatient Medications  Medication Sig Dispense Refill  . albuterol (PROVENTIL HFA;VENTOLIN HFA) 108 (90 Base) MCG/ACT inhaler Inhale 2 puffs into the lungs every 6 (six) hours as needed for wheezing or shortness of breath.    . B Complex-C-Folic Acid (DIALYVITE 161) 0.8 MG TABS Take 0.8 mg by mouth daily.     . benztropine (COGENTIN)  1 MG tablet Take 1 mg by mouth at bedtime.    . cinacalcet (SENSIPAR) 30 MG tablet Take 30-60 mg by mouth See admin instructions. Take 1 tablet (30 mg) by mouth with breakfast and 2 tablets (60 mg) with supper    . divalproex (DEPAKOTE) 500 MG DR tablet Take 500 mg by mouth 2 (two) times daily.    . iron polysaccharides (NIFEREX) 150 MG capsule Take 150 mg by mouth every other day.    . levETIRAcetam (KEPPRA) 500 MG tablet Take 1 tablet (500 mg total) by mouth 2 (two) times daily. 60 tablet 3  . levothyroxine (SYNTHROID, LEVOTHROID) 50 MCG tablet Take 50 mcg by mouth daily with breakfast.  0  . medroxyPROGESTERone (DEPO-PROVERA) 150 MG/ML injection Inject 150 mg into the muscle every 3 (three) months.     . potassium chloride SA (K-DUR,KLOR-CON) 20 MEQ tablet Take 20 mEq by mouth daily with breakfast.     . risperiDONE (RISPERDAL) 2 MG tablet Take 2 mg by mouth at bedtime.    . sevelamer carbonate (RENVELA) 800 MG tablet Take 800 mg by mouth See admin instructions. Take 1 tablet (800 mg) by mouth with meals and snacks    . vitamin B-12 (CYANOCOBALAMIN) 500 MCG tablet Take 500 mcg by mouth daily.     No current facility-administered medications for this visit.     REVIEW OF SYSTEMS:   Constitutional: ( - ) fevers, ( - )  chills , ( - ) night sweats Eyes: ( - ) blurriness of vision, ( - ) double vision, ( - ) watery eyes Ears, nose, mouth, throat, and face: ( - ) mucositis, ( - ) sore throat Respiratory: ( - ) cough, ( - ) dyspnea, ( - ) wheezes Cardiovascular: ( - ) palpitation, ( - ) chest discomfort, ( - ) lower extremity swelling Gastrointestinal:  ( - ) nausea, ( - ) heartburn, ( + ) change in bowel habits Skin: ( - ) abnormal skin rashes Lymphatics: ( - ) new lymphadenopathy, ( - ) easy bruising Neurological: ( - ) numbness, ( - ) tingling, ( - ) new weaknesses Behavioral/Psych: ( - ) mood change, ( - ) new changes  All other systems were reviewed with the patient and are  negative.  PHYSICAL EXAMINATION: ECOG PERFORMANCE STATUS: 1 - Symptomatic but completely ambulatory  Vitals:   12/09/18 0854  BP: (!) 132/93  Pulse: (!) 110  Resp: 18  Temp: 97.8 F (36.6 C)  SpO2: 100%   Filed Weights   12/09/18 0854  Weight: 159 lb 12 oz (72.5 kg)    GENERAL: alert, no distress and comfortable SKIN: skin color, texture, turgor are normal, no rashes or significant lesions EYES: conjunctiva are pink and non-injected, sclera clear OROPHARYNX: no exudate, no erythema; lips, buccal mucosa, and tongue normal  NECK: supple, non-tender LYMPH:  no palpable lymphadenopathy in the cervical LUNGS: clear to auscultation and percussion with normal breathing effort HEART: regular rate & rhythm, no murmurs, no lower extremity edema ABDOMEN: soft, non-tender, non-distended, normal bowel sounds, PD catheter intact and clean Musculoskeletal: no cyanosis of digits and no clubbing  PSYCH: alert, slowed mentation  LABORATORY DATA:  I have reviewed the data as listed Lab Results  Component Value Date   WBC 6.1 12/09/2018   HGB 7.5 (L) 12/09/2018   HCT 24.4 (L) 12/09/2018   MCV 103.4 (H) 12/09/2018   PLT 320 12/09/2018   Lab Results  Component Value Date   NA 131 (L) 10/09/2017   K 4.9 10/09/2017   CL 95 (L) 10/09/2017   CO2 23 10/09/2017   PATHOLOGY: I personally reviewed the patient's peripheral blood smear today.  The red blood cells were slightly increased in size, but the morphology appeared normal.  There was no schistocytosis.  The white blood cells were of normal morphology. There were no peripheral circulating blasts. The platelets were of normal size and I verified that there were no platelet clumping.

## 2018-12-09 ENCOUNTER — Inpatient Hospital Stay (HOSPITAL_BASED_OUTPATIENT_CLINIC_OR_DEPARTMENT_OTHER): Payer: Medicare Other | Admitting: Hematology

## 2018-12-09 ENCOUNTER — Other Ambulatory Visit: Payer: Self-pay

## 2018-12-09 ENCOUNTER — Telehealth: Payer: Self-pay | Admitting: *Deleted

## 2018-12-09 ENCOUNTER — Inpatient Hospital Stay: Payer: Medicare Other | Attending: Hematology

## 2018-12-09 ENCOUNTER — Encounter: Payer: Self-pay | Admitting: Hematology

## 2018-12-09 VITALS — BP 132/93 | HR 110 | Temp 97.8°F | Resp 18 | Wt 159.8 lb

## 2018-12-09 DIAGNOSIS — N186 End stage renal disease: Secondary | ICD-10-CM | POA: Insufficient documentation

## 2018-12-09 DIAGNOSIS — E039 Hypothyroidism, unspecified: Secondary | ICD-10-CM

## 2018-12-09 DIAGNOSIS — Z992 Dependence on renal dialysis: Secondary | ICD-10-CM

## 2018-12-09 DIAGNOSIS — I120 Hypertensive chronic kidney disease with stage 5 chronic kidney disease or end stage renal disease: Secondary | ICD-10-CM | POA: Insufficient documentation

## 2018-12-09 DIAGNOSIS — D631 Anemia in chronic kidney disease: Secondary | ICD-10-CM | POA: Diagnosis not present

## 2018-12-09 DIAGNOSIS — D539 Nutritional anemia, unspecified: Secondary | ICD-10-CM

## 2018-12-09 DIAGNOSIS — D649 Anemia, unspecified: Secondary | ICD-10-CM

## 2018-12-09 DIAGNOSIS — T8571XA Infection and inflammatory reaction due to peritoneal dialysis catheter, initial encounter: Secondary | ICD-10-CM | POA: Diagnosis not present

## 2018-12-09 DIAGNOSIS — Z23 Encounter for immunization: Secondary | ICD-10-CM | POA: Diagnosis not present

## 2018-12-09 DIAGNOSIS — N2581 Secondary hyperparathyroidism of renal origin: Secondary | ICD-10-CM | POA: Diagnosis not present

## 2018-12-09 LAB — CBC WITH DIFFERENTIAL (CANCER CENTER ONLY)
Abs Immature Granulocytes: 0.09 10*3/uL — ABNORMAL HIGH (ref 0.00–0.07)
Basophils Absolute: 0 10*3/uL (ref 0.0–0.1)
Basophils Relative: 0 %
Eosinophils Absolute: 0.1 10*3/uL (ref 0.0–0.5)
Eosinophils Relative: 2 %
HCT: 24.4 % — ABNORMAL LOW (ref 36.0–46.0)
Hemoglobin: 7.5 g/dL — ABNORMAL LOW (ref 12.0–15.0)
Immature Granulocytes: 2 %
LYMPHS ABS: 1.4 10*3/uL (ref 0.7–4.0)
Lymphocytes Relative: 22 %
MCH: 31.8 pg (ref 26.0–34.0)
MCHC: 30.7 g/dL (ref 30.0–36.0)
MCV: 103.4 fL — AB (ref 80.0–100.0)
MONOS PCT: 7 %
Monocytes Absolute: 0.4 10*3/uL (ref 0.1–1.0)
Neutro Abs: 4.1 10*3/uL (ref 1.7–7.7)
Neutrophils Relative %: 67 %
Platelet Count: 320 10*3/uL (ref 150–400)
RBC: 2.36 MIL/uL — ABNORMAL LOW (ref 3.87–5.11)
RDW: 16.4 % — ABNORMAL HIGH (ref 11.5–15.5)
WBC Count: 6.1 10*3/uL (ref 4.0–10.5)
nRBC: 0 % (ref 0.0–0.2)

## 2018-12-09 LAB — CMP (CANCER CENTER ONLY)
ALT: <5 U/L (ref 0–44)
AST: 9 U/L — ABNORMAL LOW (ref 15–41)
Albumin: 3.5 g/dL (ref 3.5–5.0)
Alkaline Phosphatase: 204 U/L — ABNORMAL HIGH (ref 38–126)
Anion gap: 15 (ref 5–15)
BUN: 37 mg/dL — ABNORMAL HIGH (ref 6–20)
CO2: 28 mmol/L (ref 22–32)
Calcium: 9.3 mg/dL (ref 8.9–10.3)
Chloride: 93 mmol/L — ABNORMAL LOW (ref 98–111)
Creatinine: 10.57 mg/dL (ref 0.44–1.00)
GFR, Est AFR Am: 4 mL/min — ABNORMAL LOW (ref >60)
GFR, Estimated: 4 mL/min — ABNORMAL LOW (ref >60)
Glucose, Bld: 77 mg/dL (ref 70–99)
Potassium: 4 mmol/L (ref 3.5–5.1)
Sodium: 136 mmol/L (ref 135–145)
Total Bilirubin: 0.3 mg/dL (ref 0.3–1.2)
Total Protein: 7.2 g/dL (ref 6.5–8.1)

## 2018-12-09 LAB — FOLATE: Folate: 66.4 ng/mL (ref >5.9)

## 2018-12-09 LAB — DIRECT ANTIGLOBULIN TEST (NOT AT ARMC)
DAT, IgG: POSITIVE
DAT, complement: NEGATIVE

## 2018-12-09 LAB — SAVE SMEAR(SSMR), FOR PROVIDER SLIDE REVIEW

## 2018-12-09 LAB — LACTATE DEHYDROGENASE: LDH: 96 U/L — ABNORMAL LOW (ref 98–192)

## 2018-12-09 LAB — VITAMIN B12: Vitamin B-12: 2405 pg/mL — ABNORMAL HIGH (ref 180–914)

## 2018-12-09 NOTE — Telephone Encounter (Signed)
Marsha from lab informed me of a critical lab value of Creatinine of 10.57. Results given to MD.

## 2018-12-10 DIAGNOSIS — N186 End stage renal disease: Secondary | ICD-10-CM | POA: Diagnosis not present

## 2018-12-10 DIAGNOSIS — Z23 Encounter for immunization: Secondary | ICD-10-CM | POA: Diagnosis not present

## 2018-12-10 DIAGNOSIS — D631 Anemia in chronic kidney disease: Secondary | ICD-10-CM | POA: Diagnosis not present

## 2018-12-10 DIAGNOSIS — T8571XA Infection and inflammatory reaction due to peritoneal dialysis catheter, initial encounter: Secondary | ICD-10-CM | POA: Diagnosis not present

## 2018-12-10 DIAGNOSIS — N2581 Secondary hyperparathyroidism of renal origin: Secondary | ICD-10-CM | POA: Diagnosis not present

## 2018-12-10 LAB — ERYTHROPOIETIN: ERYTHROPOIETIN: 110.6 m[IU]/mL — AB (ref 2.6–18.5)

## 2018-12-11 DIAGNOSIS — D631 Anemia in chronic kidney disease: Secondary | ICD-10-CM | POA: Diagnosis not present

## 2018-12-11 DIAGNOSIS — N186 End stage renal disease: Secondary | ICD-10-CM | POA: Diagnosis not present

## 2018-12-11 DIAGNOSIS — T8571XA Infection and inflammatory reaction due to peritoneal dialysis catheter, initial encounter: Secondary | ICD-10-CM | POA: Diagnosis not present

## 2018-12-11 DIAGNOSIS — Z23 Encounter for immunization: Secondary | ICD-10-CM | POA: Diagnosis not present

## 2018-12-11 DIAGNOSIS — N2581 Secondary hyperparathyroidism of renal origin: Secondary | ICD-10-CM | POA: Diagnosis not present

## 2018-12-11 LAB — IRON AND TIBC
IRON: 39 ug/dL — AB (ref 41–142)
Saturation Ratios: 23 % (ref 21–57)
TIBC: 173 ug/dL — ABNORMAL LOW (ref 236–444)
UIBC: 133 ug/dL (ref 120–384)

## 2018-12-11 LAB — FERRITIN: Ferritin: 699 ng/mL — ABNORMAL HIGH (ref 11–307)

## 2018-12-12 DIAGNOSIS — D631 Anemia in chronic kidney disease: Secondary | ICD-10-CM | POA: Diagnosis not present

## 2018-12-12 DIAGNOSIS — N2581 Secondary hyperparathyroidism of renal origin: Secondary | ICD-10-CM | POA: Diagnosis not present

## 2018-12-12 DIAGNOSIS — Z23 Encounter for immunization: Secondary | ICD-10-CM | POA: Diagnosis not present

## 2018-12-12 DIAGNOSIS — N186 End stage renal disease: Secondary | ICD-10-CM | POA: Diagnosis not present

## 2018-12-12 DIAGNOSIS — T8571XA Infection and inflammatory reaction due to peritoneal dialysis catheter, initial encounter: Secondary | ICD-10-CM | POA: Diagnosis not present

## 2018-12-13 DIAGNOSIS — Z23 Encounter for immunization: Secondary | ICD-10-CM | POA: Diagnosis not present

## 2018-12-13 DIAGNOSIS — T8571XA Infection and inflammatory reaction due to peritoneal dialysis catheter, initial encounter: Secondary | ICD-10-CM | POA: Diagnosis not present

## 2018-12-13 DIAGNOSIS — N186 End stage renal disease: Secondary | ICD-10-CM | POA: Diagnosis not present

## 2018-12-13 DIAGNOSIS — D631 Anemia in chronic kidney disease: Secondary | ICD-10-CM | POA: Diagnosis not present

## 2018-12-13 DIAGNOSIS — N2581 Secondary hyperparathyroidism of renal origin: Secondary | ICD-10-CM | POA: Diagnosis not present

## 2018-12-14 DIAGNOSIS — T8571XA Infection and inflammatory reaction due to peritoneal dialysis catheter, initial encounter: Secondary | ICD-10-CM | POA: Diagnosis not present

## 2018-12-14 DIAGNOSIS — Z23 Encounter for immunization: Secondary | ICD-10-CM | POA: Diagnosis not present

## 2018-12-14 DIAGNOSIS — N2581 Secondary hyperparathyroidism of renal origin: Secondary | ICD-10-CM | POA: Diagnosis not present

## 2018-12-14 DIAGNOSIS — N186 End stage renal disease: Secondary | ICD-10-CM | POA: Diagnosis not present

## 2018-12-14 DIAGNOSIS — D631 Anemia in chronic kidney disease: Secondary | ICD-10-CM | POA: Diagnosis not present

## 2018-12-15 DIAGNOSIS — N2581 Secondary hyperparathyroidism of renal origin: Secondary | ICD-10-CM | POA: Diagnosis not present

## 2018-12-15 DIAGNOSIS — Z23 Encounter for immunization: Secondary | ICD-10-CM | POA: Diagnosis not present

## 2018-12-15 DIAGNOSIS — D631 Anemia in chronic kidney disease: Secondary | ICD-10-CM | POA: Diagnosis not present

## 2018-12-15 DIAGNOSIS — N186 End stage renal disease: Secondary | ICD-10-CM | POA: Diagnosis not present

## 2018-12-15 DIAGNOSIS — T8571XA Infection and inflammatory reaction due to peritoneal dialysis catheter, initial encounter: Secondary | ICD-10-CM | POA: Diagnosis not present

## 2018-12-16 ENCOUNTER — Ambulatory Visit
Admission: RE | Admit: 2018-12-16 | Discharge: 2018-12-16 | Disposition: A | Discharge status: Home / Self Care | Payer: Medicare Other | Source: Ambulatory Visit | Attending: Internal Medicine | Admitting: Internal Medicine

## 2018-12-16 DIAGNOSIS — D631 Anemia in chronic kidney disease: Secondary | ICD-10-CM | POA: Diagnosis not present

## 2018-12-16 DIAGNOSIS — Z1231 Encounter for screening mammogram for malignant neoplasm of breast: Secondary | ICD-10-CM

## 2018-12-16 DIAGNOSIS — N186 End stage renal disease: Secondary | ICD-10-CM | POA: Diagnosis not present

## 2018-12-16 DIAGNOSIS — N2581 Secondary hyperparathyroidism of renal origin: Secondary | ICD-10-CM | POA: Diagnosis not present

## 2018-12-16 DIAGNOSIS — T8571XA Infection and inflammatory reaction due to peritoneal dialysis catheter, initial encounter: Secondary | ICD-10-CM | POA: Diagnosis not present

## 2018-12-16 DIAGNOSIS — Z23 Encounter for immunization: Secondary | ICD-10-CM | POA: Diagnosis not present

## 2018-12-17 DIAGNOSIS — N186 End stage renal disease: Secondary | ICD-10-CM | POA: Diagnosis not present

## 2018-12-17 DIAGNOSIS — N2581 Secondary hyperparathyroidism of renal origin: Secondary | ICD-10-CM | POA: Diagnosis not present

## 2018-12-17 DIAGNOSIS — D631 Anemia in chronic kidney disease: Secondary | ICD-10-CM | POA: Diagnosis not present

## 2018-12-17 DIAGNOSIS — T8571XA Infection and inflammatory reaction due to peritoneal dialysis catheter, initial encounter: Secondary | ICD-10-CM | POA: Diagnosis not present

## 2018-12-17 DIAGNOSIS — Z23 Encounter for immunization: Secondary | ICD-10-CM | POA: Diagnosis not present

## 2018-12-18 DIAGNOSIS — N186 End stage renal disease: Secondary | ICD-10-CM | POA: Diagnosis not present

## 2018-12-18 DIAGNOSIS — D631 Anemia in chronic kidney disease: Secondary | ICD-10-CM | POA: Diagnosis not present

## 2018-12-18 DIAGNOSIS — N2581 Secondary hyperparathyroidism of renal origin: Secondary | ICD-10-CM | POA: Diagnosis not present

## 2018-12-18 DIAGNOSIS — Z23 Encounter for immunization: Secondary | ICD-10-CM | POA: Diagnosis not present

## 2018-12-18 DIAGNOSIS — T8571XA Infection and inflammatory reaction due to peritoneal dialysis catheter, initial encounter: Secondary | ICD-10-CM | POA: Diagnosis not present

## 2018-12-19 DIAGNOSIS — N2581 Secondary hyperparathyroidism of renal origin: Secondary | ICD-10-CM | POA: Diagnosis not present

## 2018-12-19 DIAGNOSIS — T8571XA Infection and inflammatory reaction due to peritoneal dialysis catheter, initial encounter: Secondary | ICD-10-CM | POA: Diagnosis not present

## 2018-12-19 DIAGNOSIS — D631 Anemia in chronic kidney disease: Secondary | ICD-10-CM | POA: Diagnosis not present

## 2018-12-19 DIAGNOSIS — Z23 Encounter for immunization: Secondary | ICD-10-CM | POA: Diagnosis not present

## 2018-12-19 DIAGNOSIS — N186 End stage renal disease: Secondary | ICD-10-CM | POA: Diagnosis not present

## 2018-12-20 DIAGNOSIS — D631 Anemia in chronic kidney disease: Secondary | ICD-10-CM | POA: Diagnosis not present

## 2018-12-20 DIAGNOSIS — N2581 Secondary hyperparathyroidism of renal origin: Secondary | ICD-10-CM | POA: Diagnosis not present

## 2018-12-20 DIAGNOSIS — T8571XA Infection and inflammatory reaction due to peritoneal dialysis catheter, initial encounter: Secondary | ICD-10-CM | POA: Diagnosis not present

## 2018-12-20 DIAGNOSIS — N186 End stage renal disease: Secondary | ICD-10-CM | POA: Diagnosis not present

## 2018-12-20 DIAGNOSIS — Z23 Encounter for immunization: Secondary | ICD-10-CM | POA: Diagnosis not present

## 2018-12-21 DIAGNOSIS — N2581 Secondary hyperparathyroidism of renal origin: Secondary | ICD-10-CM | POA: Diagnosis not present

## 2018-12-21 DIAGNOSIS — Z992 Dependence on renal dialysis: Secondary | ICD-10-CM | POA: Diagnosis not present

## 2018-12-21 DIAGNOSIS — N186 End stage renal disease: Secondary | ICD-10-CM | POA: Diagnosis not present

## 2018-12-21 DIAGNOSIS — T8571XA Infection and inflammatory reaction due to peritoneal dialysis catheter, initial encounter: Secondary | ICD-10-CM | POA: Diagnosis not present

## 2018-12-21 DIAGNOSIS — D631 Anemia in chronic kidney disease: Secondary | ICD-10-CM | POA: Diagnosis not present

## 2018-12-21 DIAGNOSIS — Z23 Encounter for immunization: Secondary | ICD-10-CM | POA: Diagnosis not present

## 2018-12-22 DIAGNOSIS — N186 End stage renal disease: Secondary | ICD-10-CM | POA: Diagnosis not present

## 2018-12-22 DIAGNOSIS — T8571XA Infection and inflammatory reaction due to peritoneal dialysis catheter, initial encounter: Secondary | ICD-10-CM | POA: Diagnosis not present

## 2018-12-22 DIAGNOSIS — D631 Anemia in chronic kidney disease: Secondary | ICD-10-CM | POA: Diagnosis not present

## 2018-12-22 DIAGNOSIS — N2581 Secondary hyperparathyroidism of renal origin: Secondary | ICD-10-CM | POA: Diagnosis not present

## 2018-12-23 DIAGNOSIS — T8571XA Infection and inflammatory reaction due to peritoneal dialysis catheter, initial encounter: Secondary | ICD-10-CM | POA: Diagnosis not present

## 2018-12-23 DIAGNOSIS — D631 Anemia in chronic kidney disease: Secondary | ICD-10-CM | POA: Diagnosis not present

## 2018-12-23 DIAGNOSIS — N186 End stage renal disease: Secondary | ICD-10-CM | POA: Diagnosis not present

## 2018-12-23 DIAGNOSIS — N2581 Secondary hyperparathyroidism of renal origin: Secondary | ICD-10-CM | POA: Diagnosis not present

## 2018-12-24 DIAGNOSIS — T8571XA Infection and inflammatory reaction due to peritoneal dialysis catheter, initial encounter: Secondary | ICD-10-CM | POA: Diagnosis not present

## 2018-12-24 DIAGNOSIS — N186 End stage renal disease: Secondary | ICD-10-CM | POA: Diagnosis not present

## 2018-12-24 DIAGNOSIS — N2581 Secondary hyperparathyroidism of renal origin: Secondary | ICD-10-CM | POA: Diagnosis not present

## 2018-12-24 DIAGNOSIS — D631 Anemia in chronic kidney disease: Secondary | ICD-10-CM | POA: Diagnosis not present

## 2018-12-25 DIAGNOSIS — N2581 Secondary hyperparathyroidism of renal origin: Secondary | ICD-10-CM | POA: Diagnosis not present

## 2018-12-25 DIAGNOSIS — D631 Anemia in chronic kidney disease: Secondary | ICD-10-CM | POA: Diagnosis not present

## 2018-12-25 DIAGNOSIS — I959 Hypotension, unspecified: Secondary | ICD-10-CM | POA: Diagnosis not present

## 2018-12-25 DIAGNOSIS — N186 End stage renal disease: Secondary | ICD-10-CM | POA: Diagnosis not present

## 2018-12-25 DIAGNOSIS — T8571XA Infection and inflammatory reaction due to peritoneal dialysis catheter, initial encounter: Secondary | ICD-10-CM | POA: Diagnosis not present

## 2018-12-26 DIAGNOSIS — D631 Anemia in chronic kidney disease: Secondary | ICD-10-CM | POA: Diagnosis not present

## 2018-12-26 DIAGNOSIS — N186 End stage renal disease: Secondary | ICD-10-CM | POA: Diagnosis not present

## 2018-12-26 DIAGNOSIS — N2581 Secondary hyperparathyroidism of renal origin: Secondary | ICD-10-CM | POA: Diagnosis not present

## 2018-12-26 DIAGNOSIS — T8571XA Infection and inflammatory reaction due to peritoneal dialysis catheter, initial encounter: Secondary | ICD-10-CM | POA: Diagnosis not present

## 2018-12-27 DIAGNOSIS — T8571XA Infection and inflammatory reaction due to peritoneal dialysis catheter, initial encounter: Secondary | ICD-10-CM | POA: Diagnosis not present

## 2018-12-27 DIAGNOSIS — N186 End stage renal disease: Secondary | ICD-10-CM | POA: Diagnosis not present

## 2018-12-27 DIAGNOSIS — N2581 Secondary hyperparathyroidism of renal origin: Secondary | ICD-10-CM | POA: Diagnosis not present

## 2018-12-27 DIAGNOSIS — D631 Anemia in chronic kidney disease: Secondary | ICD-10-CM | POA: Diagnosis not present

## 2018-12-28 DIAGNOSIS — D631 Anemia in chronic kidney disease: Secondary | ICD-10-CM | POA: Diagnosis not present

## 2018-12-28 DIAGNOSIS — N2581 Secondary hyperparathyroidism of renal origin: Secondary | ICD-10-CM | POA: Diagnosis not present

## 2018-12-28 DIAGNOSIS — T8571XA Infection and inflammatory reaction due to peritoneal dialysis catheter, initial encounter: Secondary | ICD-10-CM | POA: Diagnosis not present

## 2018-12-28 DIAGNOSIS — N186 End stage renal disease: Secondary | ICD-10-CM | POA: Diagnosis not present

## 2018-12-29 DIAGNOSIS — N2581 Secondary hyperparathyroidism of renal origin: Secondary | ICD-10-CM | POA: Diagnosis not present

## 2018-12-29 DIAGNOSIS — N186 End stage renal disease: Secondary | ICD-10-CM | POA: Diagnosis not present

## 2018-12-29 DIAGNOSIS — T8571XA Infection and inflammatory reaction due to peritoneal dialysis catheter, initial encounter: Secondary | ICD-10-CM | POA: Diagnosis not present

## 2018-12-29 DIAGNOSIS — D631 Anemia in chronic kidney disease: Secondary | ICD-10-CM | POA: Diagnosis not present

## 2018-12-30 DIAGNOSIS — N2581 Secondary hyperparathyroidism of renal origin: Secondary | ICD-10-CM | POA: Diagnosis not present

## 2018-12-30 DIAGNOSIS — N186 End stage renal disease: Secondary | ICD-10-CM | POA: Diagnosis not present

## 2018-12-30 DIAGNOSIS — D631 Anemia in chronic kidney disease: Secondary | ICD-10-CM | POA: Diagnosis not present

## 2018-12-30 DIAGNOSIS — T8571XA Infection and inflammatory reaction due to peritoneal dialysis catheter, initial encounter: Secondary | ICD-10-CM | POA: Diagnosis not present

## 2018-12-31 DIAGNOSIS — T8571XA Infection and inflammatory reaction due to peritoneal dialysis catheter, initial encounter: Secondary | ICD-10-CM | POA: Diagnosis not present

## 2018-12-31 DIAGNOSIS — N186 End stage renal disease: Secondary | ICD-10-CM | POA: Diagnosis not present

## 2018-12-31 DIAGNOSIS — D631 Anemia in chronic kidney disease: Secondary | ICD-10-CM | POA: Diagnosis not present

## 2018-12-31 DIAGNOSIS — N2581 Secondary hyperparathyroidism of renal origin: Secondary | ICD-10-CM | POA: Diagnosis not present

## 2019-01-01 DIAGNOSIS — T8571XA Infection and inflammatory reaction due to peritoneal dialysis catheter, initial encounter: Secondary | ICD-10-CM | POA: Diagnosis not present

## 2019-01-01 DIAGNOSIS — D631 Anemia in chronic kidney disease: Secondary | ICD-10-CM | POA: Diagnosis not present

## 2019-01-01 DIAGNOSIS — N2581 Secondary hyperparathyroidism of renal origin: Secondary | ICD-10-CM | POA: Diagnosis not present

## 2019-01-01 DIAGNOSIS — N186 End stage renal disease: Secondary | ICD-10-CM | POA: Diagnosis not present

## 2019-01-02 DIAGNOSIS — T8571XA Infection and inflammatory reaction due to peritoneal dialysis catheter, initial encounter: Secondary | ICD-10-CM | POA: Diagnosis not present

## 2019-01-02 DIAGNOSIS — N186 End stage renal disease: Secondary | ICD-10-CM | POA: Diagnosis not present

## 2019-01-02 DIAGNOSIS — D631 Anemia in chronic kidney disease: Secondary | ICD-10-CM | POA: Diagnosis not present

## 2019-01-02 DIAGNOSIS — N2581 Secondary hyperparathyroidism of renal origin: Secondary | ICD-10-CM | POA: Diagnosis not present

## 2019-01-03 DIAGNOSIS — T8571XA Infection and inflammatory reaction due to peritoneal dialysis catheter, initial encounter: Secondary | ICD-10-CM | POA: Diagnosis not present

## 2019-01-03 DIAGNOSIS — N2581 Secondary hyperparathyroidism of renal origin: Secondary | ICD-10-CM | POA: Diagnosis not present

## 2019-01-03 DIAGNOSIS — N186 End stage renal disease: Secondary | ICD-10-CM | POA: Diagnosis not present

## 2019-01-03 DIAGNOSIS — D631 Anemia in chronic kidney disease: Secondary | ICD-10-CM | POA: Diagnosis not present

## 2019-01-04 DIAGNOSIS — D631 Anemia in chronic kidney disease: Secondary | ICD-10-CM | POA: Diagnosis not present

## 2019-01-04 DIAGNOSIS — N186 End stage renal disease: Secondary | ICD-10-CM | POA: Diagnosis not present

## 2019-01-04 DIAGNOSIS — T8571XA Infection and inflammatory reaction due to peritoneal dialysis catheter, initial encounter: Secondary | ICD-10-CM | POA: Diagnosis not present

## 2019-01-04 DIAGNOSIS — N2581 Secondary hyperparathyroidism of renal origin: Secondary | ICD-10-CM | POA: Diagnosis not present

## 2019-01-05 DIAGNOSIS — D631 Anemia in chronic kidney disease: Secondary | ICD-10-CM | POA: Diagnosis not present

## 2019-01-05 DIAGNOSIS — T8571XA Infection and inflammatory reaction due to peritoneal dialysis catheter, initial encounter: Secondary | ICD-10-CM | POA: Diagnosis not present

## 2019-01-05 DIAGNOSIS — N2581 Secondary hyperparathyroidism of renal origin: Secondary | ICD-10-CM | POA: Diagnosis not present

## 2019-01-05 DIAGNOSIS — N186 End stage renal disease: Secondary | ICD-10-CM | POA: Diagnosis not present

## 2019-01-06 DIAGNOSIS — N2581 Secondary hyperparathyroidism of renal origin: Secondary | ICD-10-CM | POA: Diagnosis not present

## 2019-01-06 DIAGNOSIS — D631 Anemia in chronic kidney disease: Secondary | ICD-10-CM | POA: Diagnosis not present

## 2019-01-06 DIAGNOSIS — N186 End stage renal disease: Secondary | ICD-10-CM | POA: Diagnosis not present

## 2019-01-06 DIAGNOSIS — T8571XA Infection and inflammatory reaction due to peritoneal dialysis catheter, initial encounter: Secondary | ICD-10-CM | POA: Diagnosis not present

## 2019-01-07 DIAGNOSIS — D631 Anemia in chronic kidney disease: Secondary | ICD-10-CM | POA: Diagnosis not present

## 2019-01-07 DIAGNOSIS — T8571XA Infection and inflammatory reaction due to peritoneal dialysis catheter, initial encounter: Secondary | ICD-10-CM | POA: Diagnosis not present

## 2019-01-07 DIAGNOSIS — N186 End stage renal disease: Secondary | ICD-10-CM | POA: Diagnosis not present

## 2019-01-07 DIAGNOSIS — N2581 Secondary hyperparathyroidism of renal origin: Secondary | ICD-10-CM | POA: Diagnosis not present

## 2019-01-08 DIAGNOSIS — D631 Anemia in chronic kidney disease: Secondary | ICD-10-CM | POA: Diagnosis not present

## 2019-01-08 DIAGNOSIS — T8571XA Infection and inflammatory reaction due to peritoneal dialysis catheter, initial encounter: Secondary | ICD-10-CM | POA: Diagnosis not present

## 2019-01-08 DIAGNOSIS — N186 End stage renal disease: Secondary | ICD-10-CM | POA: Diagnosis not present

## 2019-01-08 DIAGNOSIS — N2581 Secondary hyperparathyroidism of renal origin: Secondary | ICD-10-CM | POA: Diagnosis not present

## 2019-01-09 DIAGNOSIS — D631 Anemia in chronic kidney disease: Secondary | ICD-10-CM | POA: Diagnosis not present

## 2019-01-09 DIAGNOSIS — N186 End stage renal disease: Secondary | ICD-10-CM | POA: Diagnosis not present

## 2019-01-09 DIAGNOSIS — N2581 Secondary hyperparathyroidism of renal origin: Secondary | ICD-10-CM | POA: Diagnosis not present

## 2019-01-09 DIAGNOSIS — T8571XA Infection and inflammatory reaction due to peritoneal dialysis catheter, initial encounter: Secondary | ICD-10-CM | POA: Diagnosis not present

## 2019-01-10 DIAGNOSIS — N186 End stage renal disease: Secondary | ICD-10-CM | POA: Diagnosis not present

## 2019-01-10 DIAGNOSIS — N2581 Secondary hyperparathyroidism of renal origin: Secondary | ICD-10-CM | POA: Diagnosis not present

## 2019-01-10 DIAGNOSIS — D631 Anemia in chronic kidney disease: Secondary | ICD-10-CM | POA: Diagnosis not present

## 2019-01-10 DIAGNOSIS — T8571XA Infection and inflammatory reaction due to peritoneal dialysis catheter, initial encounter: Secondary | ICD-10-CM | POA: Diagnosis not present

## 2019-01-11 DIAGNOSIS — N2581 Secondary hyperparathyroidism of renal origin: Secondary | ICD-10-CM | POA: Diagnosis not present

## 2019-01-11 DIAGNOSIS — N186 End stage renal disease: Secondary | ICD-10-CM | POA: Diagnosis not present

## 2019-01-11 DIAGNOSIS — D631 Anemia in chronic kidney disease: Secondary | ICD-10-CM | POA: Diagnosis not present

## 2019-01-11 DIAGNOSIS — T8571XA Infection and inflammatory reaction due to peritoneal dialysis catheter, initial encounter: Secondary | ICD-10-CM | POA: Diagnosis not present

## 2019-01-12 DIAGNOSIS — D631 Anemia in chronic kidney disease: Secondary | ICD-10-CM | POA: Diagnosis not present

## 2019-01-12 DIAGNOSIS — N186 End stage renal disease: Secondary | ICD-10-CM | POA: Diagnosis not present

## 2019-01-12 DIAGNOSIS — N2581 Secondary hyperparathyroidism of renal origin: Secondary | ICD-10-CM | POA: Diagnosis not present

## 2019-01-12 DIAGNOSIS — T8571XA Infection and inflammatory reaction due to peritoneal dialysis catheter, initial encounter: Secondary | ICD-10-CM | POA: Diagnosis not present

## 2019-01-13 DIAGNOSIS — N186 End stage renal disease: Secondary | ICD-10-CM | POA: Diagnosis not present

## 2019-01-13 DIAGNOSIS — D631 Anemia in chronic kidney disease: Secondary | ICD-10-CM | POA: Diagnosis not present

## 2019-01-13 DIAGNOSIS — N2581 Secondary hyperparathyroidism of renal origin: Secondary | ICD-10-CM | POA: Diagnosis not present

## 2019-01-13 DIAGNOSIS — T8571XA Infection and inflammatory reaction due to peritoneal dialysis catheter, initial encounter: Secondary | ICD-10-CM | POA: Diagnosis not present

## 2019-01-14 DIAGNOSIS — T8571XA Infection and inflammatory reaction due to peritoneal dialysis catheter, initial encounter: Secondary | ICD-10-CM | POA: Diagnosis not present

## 2019-01-14 DIAGNOSIS — N186 End stage renal disease: Secondary | ICD-10-CM | POA: Diagnosis not present

## 2019-01-14 DIAGNOSIS — D631 Anemia in chronic kidney disease: Secondary | ICD-10-CM | POA: Diagnosis not present

## 2019-01-14 DIAGNOSIS — N2581 Secondary hyperparathyroidism of renal origin: Secondary | ICD-10-CM | POA: Diagnosis not present

## 2019-01-15 DIAGNOSIS — N2581 Secondary hyperparathyroidism of renal origin: Secondary | ICD-10-CM | POA: Diagnosis not present

## 2019-01-15 DIAGNOSIS — D539 Nutritional anemia, unspecified: Secondary | ICD-10-CM | POA: Diagnosis not present

## 2019-01-15 DIAGNOSIS — D631 Anemia in chronic kidney disease: Secondary | ICD-10-CM | POA: Diagnosis not present

## 2019-01-15 DIAGNOSIS — T8571XA Infection and inflammatory reaction due to peritoneal dialysis catheter, initial encounter: Secondary | ICD-10-CM | POA: Diagnosis not present

## 2019-01-15 DIAGNOSIS — N926 Irregular menstruation, unspecified: Secondary | ICD-10-CM | POA: Diagnosis not present

## 2019-01-15 DIAGNOSIS — R569 Unspecified convulsions: Secondary | ICD-10-CM | POA: Diagnosis not present

## 2019-01-15 DIAGNOSIS — E039 Hypothyroidism, unspecified: Secondary | ICD-10-CM | POA: Diagnosis not present

## 2019-01-15 DIAGNOSIS — Z3042 Encounter for surveillance of injectable contraceptive: Secondary | ICD-10-CM | POA: Diagnosis not present

## 2019-01-15 DIAGNOSIS — E782 Mixed hyperlipidemia: Secondary | ICD-10-CM | POA: Diagnosis not present

## 2019-01-15 DIAGNOSIS — N186 End stage renal disease: Secondary | ICD-10-CM | POA: Diagnosis not present

## 2019-01-16 DIAGNOSIS — N2581 Secondary hyperparathyroidism of renal origin: Secondary | ICD-10-CM | POA: Diagnosis not present

## 2019-01-16 DIAGNOSIS — T8571XA Infection and inflammatory reaction due to peritoneal dialysis catheter, initial encounter: Secondary | ICD-10-CM | POA: Diagnosis not present

## 2019-01-16 DIAGNOSIS — D631 Anemia in chronic kidney disease: Secondary | ICD-10-CM | POA: Diagnosis not present

## 2019-01-16 DIAGNOSIS — N186 End stage renal disease: Secondary | ICD-10-CM | POA: Diagnosis not present

## 2019-01-17 DIAGNOSIS — T8571XA Infection and inflammatory reaction due to peritoneal dialysis catheter, initial encounter: Secondary | ICD-10-CM | POA: Diagnosis not present

## 2019-01-17 DIAGNOSIS — N186 End stage renal disease: Secondary | ICD-10-CM | POA: Diagnosis not present

## 2019-01-17 DIAGNOSIS — D631 Anemia in chronic kidney disease: Secondary | ICD-10-CM | POA: Diagnosis not present

## 2019-01-17 DIAGNOSIS — N2581 Secondary hyperparathyroidism of renal origin: Secondary | ICD-10-CM | POA: Diagnosis not present

## 2019-01-18 DIAGNOSIS — N2581 Secondary hyperparathyroidism of renal origin: Secondary | ICD-10-CM | POA: Diagnosis not present

## 2019-01-18 DIAGNOSIS — N186 End stage renal disease: Secondary | ICD-10-CM | POA: Diagnosis not present

## 2019-01-18 DIAGNOSIS — T8571XA Infection and inflammatory reaction due to peritoneal dialysis catheter, initial encounter: Secondary | ICD-10-CM | POA: Diagnosis not present

## 2019-01-18 DIAGNOSIS — D631 Anemia in chronic kidney disease: Secondary | ICD-10-CM | POA: Diagnosis not present

## 2019-01-19 DIAGNOSIS — N186 End stage renal disease: Secondary | ICD-10-CM | POA: Diagnosis not present

## 2019-01-19 DIAGNOSIS — D631 Anemia in chronic kidney disease: Secondary | ICD-10-CM | POA: Diagnosis not present

## 2019-01-19 DIAGNOSIS — T8571XA Infection and inflammatory reaction due to peritoneal dialysis catheter, initial encounter: Secondary | ICD-10-CM | POA: Diagnosis not present

## 2019-01-19 DIAGNOSIS — N2581 Secondary hyperparathyroidism of renal origin: Secondary | ICD-10-CM | POA: Diagnosis not present

## 2019-01-20 DIAGNOSIS — D631 Anemia in chronic kidney disease: Secondary | ICD-10-CM | POA: Diagnosis not present

## 2019-01-20 DIAGNOSIS — N2581 Secondary hyperparathyroidism of renal origin: Secondary | ICD-10-CM | POA: Diagnosis not present

## 2019-01-20 DIAGNOSIS — N186 End stage renal disease: Secondary | ICD-10-CM | POA: Diagnosis not present

## 2019-01-20 DIAGNOSIS — T8571XA Infection and inflammatory reaction due to peritoneal dialysis catheter, initial encounter: Secondary | ICD-10-CM | POA: Diagnosis not present

## 2019-01-21 DIAGNOSIS — Z79899 Other long term (current) drug therapy: Secondary | ICD-10-CM | POA: Diagnosis not present

## 2019-01-21 DIAGNOSIS — Z01818 Encounter for other preprocedural examination: Secondary | ICD-10-CM | POA: Diagnosis not present

## 2019-01-21 DIAGNOSIS — T8571XA Infection and inflammatory reaction due to peritoneal dialysis catheter, initial encounter: Secondary | ICD-10-CM | POA: Diagnosis not present

## 2019-01-21 DIAGNOSIS — D631 Anemia in chronic kidney disease: Secondary | ICD-10-CM | POA: Diagnosis not present

## 2019-01-21 DIAGNOSIS — Z992 Dependence on renal dialysis: Secondary | ICD-10-CM | POA: Diagnosis not present

## 2019-01-21 DIAGNOSIS — Z7682 Awaiting organ transplant status: Secondary | ICD-10-CM | POA: Diagnosis not present

## 2019-01-21 DIAGNOSIS — Z01812 Encounter for preprocedural laboratory examination: Secondary | ICD-10-CM | POA: Diagnosis not present

## 2019-01-21 DIAGNOSIS — I120 Hypertensive chronic kidney disease with stage 5 chronic kidney disease or end stage renal disease: Secondary | ICD-10-CM | POA: Diagnosis not present

## 2019-01-21 DIAGNOSIS — N2581 Secondary hyperparathyroidism of renal origin: Secondary | ICD-10-CM | POA: Diagnosis not present

## 2019-01-21 DIAGNOSIS — N186 End stage renal disease: Secondary | ICD-10-CM | POA: Diagnosis not present

## 2019-01-22 DIAGNOSIS — D631 Anemia in chronic kidney disease: Secondary | ICD-10-CM | POA: Diagnosis not present

## 2019-01-22 DIAGNOSIS — N186 End stage renal disease: Secondary | ICD-10-CM | POA: Diagnosis not present

## 2019-01-22 DIAGNOSIS — Z418 Encounter for other procedures for purposes other than remedying health state: Secondary | ICD-10-CM | POA: Diagnosis not present

## 2019-01-22 DIAGNOSIS — N2581 Secondary hyperparathyroidism of renal origin: Secondary | ICD-10-CM | POA: Diagnosis not present

## 2019-01-23 DIAGNOSIS — N186 End stage renal disease: Secondary | ICD-10-CM | POA: Diagnosis not present

## 2019-01-23 DIAGNOSIS — Z418 Encounter for other procedures for purposes other than remedying health state: Secondary | ICD-10-CM | POA: Diagnosis not present

## 2019-01-23 DIAGNOSIS — D631 Anemia in chronic kidney disease: Secondary | ICD-10-CM | POA: Diagnosis not present

## 2019-01-23 DIAGNOSIS — Z4932 Encounter for adequacy testing for peritoneal dialysis: Secondary | ICD-10-CM | POA: Diagnosis not present

## 2019-01-23 DIAGNOSIS — N2581 Secondary hyperparathyroidism of renal origin: Secondary | ICD-10-CM | POA: Diagnosis not present

## 2019-01-24 DIAGNOSIS — Z7682 Awaiting organ transplant status: Secondary | ICD-10-CM | POA: Diagnosis not present

## 2019-01-24 DIAGNOSIS — D631 Anemia in chronic kidney disease: Secondary | ICD-10-CM | POA: Diagnosis not present

## 2019-01-24 DIAGNOSIS — Z79899 Other long term (current) drug therapy: Secondary | ICD-10-CM | POA: Diagnosis not present

## 2019-01-24 DIAGNOSIS — N186 End stage renal disease: Secondary | ICD-10-CM | POA: Diagnosis not present

## 2019-01-24 DIAGNOSIS — N2581 Secondary hyperparathyroidism of renal origin: Secondary | ICD-10-CM | POA: Diagnosis not present

## 2019-01-24 DIAGNOSIS — Z01812 Encounter for preprocedural laboratory examination: Secondary | ICD-10-CM | POA: Diagnosis not present

## 2019-01-24 DIAGNOSIS — Z418 Encounter for other procedures for purposes other than remedying health state: Secondary | ICD-10-CM | POA: Diagnosis not present

## 2019-01-24 DIAGNOSIS — Z01818 Encounter for other preprocedural examination: Secondary | ICD-10-CM | POA: Diagnosis not present

## 2019-01-25 DIAGNOSIS — D631 Anemia in chronic kidney disease: Secondary | ICD-10-CM | POA: Diagnosis not present

## 2019-01-25 DIAGNOSIS — N186 End stage renal disease: Secondary | ICD-10-CM | POA: Diagnosis not present

## 2019-01-25 DIAGNOSIS — Z418 Encounter for other procedures for purposes other than remedying health state: Secondary | ICD-10-CM | POA: Diagnosis not present

## 2019-01-25 DIAGNOSIS — N2581 Secondary hyperparathyroidism of renal origin: Secondary | ICD-10-CM | POA: Diagnosis not present

## 2019-01-26 DIAGNOSIS — N2581 Secondary hyperparathyroidism of renal origin: Secondary | ICD-10-CM | POA: Diagnosis not present

## 2019-01-26 DIAGNOSIS — N186 End stage renal disease: Secondary | ICD-10-CM | POA: Diagnosis not present

## 2019-01-26 DIAGNOSIS — D631 Anemia in chronic kidney disease: Secondary | ICD-10-CM | POA: Diagnosis not present

## 2019-01-26 DIAGNOSIS — Z418 Encounter for other procedures for purposes other than remedying health state: Secondary | ICD-10-CM | POA: Diagnosis not present

## 2019-01-27 DIAGNOSIS — Z418 Encounter for other procedures for purposes other than remedying health state: Secondary | ICD-10-CM | POA: Diagnosis not present

## 2019-01-27 DIAGNOSIS — D631 Anemia in chronic kidney disease: Secondary | ICD-10-CM | POA: Diagnosis not present

## 2019-01-27 DIAGNOSIS — N186 End stage renal disease: Secondary | ICD-10-CM | POA: Diagnosis not present

## 2019-01-27 DIAGNOSIS — N2581 Secondary hyperparathyroidism of renal origin: Secondary | ICD-10-CM | POA: Diagnosis not present

## 2019-01-28 DIAGNOSIS — Z418 Encounter for other procedures for purposes other than remedying health state: Secondary | ICD-10-CM | POA: Diagnosis not present

## 2019-01-28 DIAGNOSIS — N2581 Secondary hyperparathyroidism of renal origin: Secondary | ICD-10-CM | POA: Diagnosis not present

## 2019-01-28 DIAGNOSIS — D631 Anemia in chronic kidney disease: Secondary | ICD-10-CM | POA: Diagnosis not present

## 2019-01-28 DIAGNOSIS — N186 End stage renal disease: Secondary | ICD-10-CM | POA: Diagnosis not present

## 2019-01-29 DIAGNOSIS — F250 Schizoaffective disorder, bipolar type: Secondary | ICD-10-CM | POA: Diagnosis not present

## 2019-01-29 DIAGNOSIS — F71 Moderate intellectual disabilities: Secondary | ICD-10-CM | POA: Diagnosis not present

## 2019-01-29 DIAGNOSIS — N2581 Secondary hyperparathyroidism of renal origin: Secondary | ICD-10-CM | POA: Diagnosis not present

## 2019-01-29 DIAGNOSIS — N186 End stage renal disease: Secondary | ICD-10-CM | POA: Diagnosis not present

## 2019-01-29 DIAGNOSIS — D631 Anemia in chronic kidney disease: Secondary | ICD-10-CM | POA: Diagnosis not present

## 2019-01-29 DIAGNOSIS — F319 Bipolar disorder, unspecified: Secondary | ICD-10-CM | POA: Diagnosis not present

## 2019-01-29 DIAGNOSIS — Z418 Encounter for other procedures for purposes other than remedying health state: Secondary | ICD-10-CM | POA: Diagnosis not present

## 2019-01-29 DIAGNOSIS — F29 Unspecified psychosis not due to a substance or known physiological condition: Secondary | ICD-10-CM | POA: Diagnosis not present

## 2019-01-30 DIAGNOSIS — N186 End stage renal disease: Secondary | ICD-10-CM | POA: Diagnosis not present

## 2019-01-30 DIAGNOSIS — Z418 Encounter for other procedures for purposes other than remedying health state: Secondary | ICD-10-CM | POA: Diagnosis not present

## 2019-01-30 DIAGNOSIS — N2581 Secondary hyperparathyroidism of renal origin: Secondary | ICD-10-CM | POA: Diagnosis not present

## 2019-01-30 DIAGNOSIS — D631 Anemia in chronic kidney disease: Secondary | ICD-10-CM | POA: Diagnosis not present

## 2019-01-31 DIAGNOSIS — D631 Anemia in chronic kidney disease: Secondary | ICD-10-CM | POA: Diagnosis not present

## 2019-01-31 DIAGNOSIS — N186 End stage renal disease: Secondary | ICD-10-CM | POA: Diagnosis not present

## 2019-01-31 DIAGNOSIS — N2581 Secondary hyperparathyroidism of renal origin: Secondary | ICD-10-CM | POA: Diagnosis not present

## 2019-01-31 DIAGNOSIS — Z418 Encounter for other procedures for purposes other than remedying health state: Secondary | ICD-10-CM | POA: Diagnosis not present

## 2019-02-01 DIAGNOSIS — N186 End stage renal disease: Secondary | ICD-10-CM | POA: Diagnosis not present

## 2019-02-01 DIAGNOSIS — N2581 Secondary hyperparathyroidism of renal origin: Secondary | ICD-10-CM | POA: Diagnosis not present

## 2019-02-01 DIAGNOSIS — Z418 Encounter for other procedures for purposes other than remedying health state: Secondary | ICD-10-CM | POA: Diagnosis not present

## 2019-02-01 DIAGNOSIS — D631 Anemia in chronic kidney disease: Secondary | ICD-10-CM | POA: Diagnosis not present

## 2019-02-02 DIAGNOSIS — N186 End stage renal disease: Secondary | ICD-10-CM | POA: Diagnosis not present

## 2019-02-02 DIAGNOSIS — Z418 Encounter for other procedures for purposes other than remedying health state: Secondary | ICD-10-CM | POA: Diagnosis not present

## 2019-02-02 DIAGNOSIS — N2581 Secondary hyperparathyroidism of renal origin: Secondary | ICD-10-CM | POA: Diagnosis not present

## 2019-02-02 DIAGNOSIS — D631 Anemia in chronic kidney disease: Secondary | ICD-10-CM | POA: Diagnosis not present

## 2019-02-03 DIAGNOSIS — N186 End stage renal disease: Secondary | ICD-10-CM | POA: Diagnosis not present

## 2019-02-03 DIAGNOSIS — N2581 Secondary hyperparathyroidism of renal origin: Secondary | ICD-10-CM | POA: Diagnosis not present

## 2019-02-03 DIAGNOSIS — Z418 Encounter for other procedures for purposes other than remedying health state: Secondary | ICD-10-CM | POA: Diagnosis not present

## 2019-02-03 DIAGNOSIS — D631 Anemia in chronic kidney disease: Secondary | ICD-10-CM | POA: Diagnosis not present

## 2019-02-04 DIAGNOSIS — N186 End stage renal disease: Secondary | ICD-10-CM | POA: Diagnosis not present

## 2019-02-04 DIAGNOSIS — D631 Anemia in chronic kidney disease: Secondary | ICD-10-CM | POA: Diagnosis not present

## 2019-02-04 DIAGNOSIS — N2581 Secondary hyperparathyroidism of renal origin: Secondary | ICD-10-CM | POA: Diagnosis not present

## 2019-02-04 DIAGNOSIS — Z418 Encounter for other procedures for purposes other than remedying health state: Secondary | ICD-10-CM | POA: Diagnosis not present

## 2019-02-05 DIAGNOSIS — D631 Anemia in chronic kidney disease: Secondary | ICD-10-CM | POA: Diagnosis not present

## 2019-02-05 DIAGNOSIS — N186 End stage renal disease: Secondary | ICD-10-CM | POA: Diagnosis not present

## 2019-02-05 DIAGNOSIS — N2581 Secondary hyperparathyroidism of renal origin: Secondary | ICD-10-CM | POA: Diagnosis not present

## 2019-02-05 DIAGNOSIS — Z418 Encounter for other procedures for purposes other than remedying health state: Secondary | ICD-10-CM | POA: Diagnosis not present

## 2019-02-06 DIAGNOSIS — Z418 Encounter for other procedures for purposes other than remedying health state: Secondary | ICD-10-CM | POA: Diagnosis not present

## 2019-02-06 DIAGNOSIS — D631 Anemia in chronic kidney disease: Secondary | ICD-10-CM | POA: Diagnosis not present

## 2019-02-06 DIAGNOSIS — N186 End stage renal disease: Secondary | ICD-10-CM | POA: Diagnosis not present

## 2019-02-06 DIAGNOSIS — N2581 Secondary hyperparathyroidism of renal origin: Secondary | ICD-10-CM | POA: Diagnosis not present

## 2019-02-07 DIAGNOSIS — Z418 Encounter for other procedures for purposes other than remedying health state: Secondary | ICD-10-CM | POA: Diagnosis not present

## 2019-02-07 DIAGNOSIS — N186 End stage renal disease: Secondary | ICD-10-CM | POA: Diagnosis not present

## 2019-02-07 DIAGNOSIS — N2581 Secondary hyperparathyroidism of renal origin: Secondary | ICD-10-CM | POA: Diagnosis not present

## 2019-02-07 DIAGNOSIS — D631 Anemia in chronic kidney disease: Secondary | ICD-10-CM | POA: Diagnosis not present

## 2019-02-08 DIAGNOSIS — D631 Anemia in chronic kidney disease: Secondary | ICD-10-CM | POA: Diagnosis not present

## 2019-02-08 DIAGNOSIS — N2581 Secondary hyperparathyroidism of renal origin: Secondary | ICD-10-CM | POA: Diagnosis not present

## 2019-02-08 DIAGNOSIS — Z418 Encounter for other procedures for purposes other than remedying health state: Secondary | ICD-10-CM | POA: Diagnosis not present

## 2019-02-08 DIAGNOSIS — N186 End stage renal disease: Secondary | ICD-10-CM | POA: Diagnosis not present

## 2019-02-09 DIAGNOSIS — D631 Anemia in chronic kidney disease: Secondary | ICD-10-CM | POA: Diagnosis not present

## 2019-02-09 DIAGNOSIS — Z418 Encounter for other procedures for purposes other than remedying health state: Secondary | ICD-10-CM | POA: Diagnosis not present

## 2019-02-09 DIAGNOSIS — N2581 Secondary hyperparathyroidism of renal origin: Secondary | ICD-10-CM | POA: Diagnosis not present

## 2019-02-09 DIAGNOSIS — N186 End stage renal disease: Secondary | ICD-10-CM | POA: Diagnosis not present

## 2019-02-10 DIAGNOSIS — N186 End stage renal disease: Secondary | ICD-10-CM | POA: Diagnosis not present

## 2019-02-10 DIAGNOSIS — D631 Anemia in chronic kidney disease: Secondary | ICD-10-CM | POA: Diagnosis not present

## 2019-02-10 DIAGNOSIS — Z418 Encounter for other procedures for purposes other than remedying health state: Secondary | ICD-10-CM | POA: Diagnosis not present

## 2019-02-10 DIAGNOSIS — N2581 Secondary hyperparathyroidism of renal origin: Secondary | ICD-10-CM | POA: Diagnosis not present

## 2019-02-11 DIAGNOSIS — N2581 Secondary hyperparathyroidism of renal origin: Secondary | ICD-10-CM | POA: Diagnosis not present

## 2019-02-11 DIAGNOSIS — Z418 Encounter for other procedures for purposes other than remedying health state: Secondary | ICD-10-CM | POA: Diagnosis not present

## 2019-02-11 DIAGNOSIS — D631 Anemia in chronic kidney disease: Secondary | ICD-10-CM | POA: Diagnosis not present

## 2019-02-11 DIAGNOSIS — N186 End stage renal disease: Secondary | ICD-10-CM | POA: Diagnosis not present

## 2019-02-12 DIAGNOSIS — Z418 Encounter for other procedures for purposes other than remedying health state: Secondary | ICD-10-CM | POA: Diagnosis not present

## 2019-02-12 DIAGNOSIS — N2581 Secondary hyperparathyroidism of renal origin: Secondary | ICD-10-CM | POA: Diagnosis not present

## 2019-02-12 DIAGNOSIS — N186 End stage renal disease: Secondary | ICD-10-CM | POA: Diagnosis not present

## 2019-02-12 DIAGNOSIS — D631 Anemia in chronic kidney disease: Secondary | ICD-10-CM | POA: Diagnosis not present

## 2019-02-13 DIAGNOSIS — N186 End stage renal disease: Secondary | ICD-10-CM | POA: Diagnosis not present

## 2019-02-13 DIAGNOSIS — N2581 Secondary hyperparathyroidism of renal origin: Secondary | ICD-10-CM | POA: Diagnosis not present

## 2019-02-13 DIAGNOSIS — D631 Anemia in chronic kidney disease: Secondary | ICD-10-CM | POA: Diagnosis not present

## 2019-02-13 DIAGNOSIS — Z418 Encounter for other procedures for purposes other than remedying health state: Secondary | ICD-10-CM | POA: Diagnosis not present

## 2019-02-14 DIAGNOSIS — D631 Anemia in chronic kidney disease: Secondary | ICD-10-CM | POA: Diagnosis not present

## 2019-02-14 DIAGNOSIS — N2581 Secondary hyperparathyroidism of renal origin: Secondary | ICD-10-CM | POA: Diagnosis not present

## 2019-02-14 DIAGNOSIS — Z418 Encounter for other procedures for purposes other than remedying health state: Secondary | ICD-10-CM | POA: Diagnosis not present

## 2019-02-14 DIAGNOSIS — N186 End stage renal disease: Secondary | ICD-10-CM | POA: Diagnosis not present

## 2019-02-15 DIAGNOSIS — N2581 Secondary hyperparathyroidism of renal origin: Secondary | ICD-10-CM | POA: Diagnosis not present

## 2019-02-15 DIAGNOSIS — N186 End stage renal disease: Secondary | ICD-10-CM | POA: Diagnosis not present

## 2019-02-15 DIAGNOSIS — Z418 Encounter for other procedures for purposes other than remedying health state: Secondary | ICD-10-CM | POA: Diagnosis not present

## 2019-02-15 DIAGNOSIS — D631 Anemia in chronic kidney disease: Secondary | ICD-10-CM | POA: Diagnosis not present

## 2019-02-16 DIAGNOSIS — N186 End stage renal disease: Secondary | ICD-10-CM | POA: Diagnosis not present

## 2019-02-16 DIAGNOSIS — Z418 Encounter for other procedures for purposes other than remedying health state: Secondary | ICD-10-CM | POA: Diagnosis not present

## 2019-02-16 DIAGNOSIS — N2581 Secondary hyperparathyroidism of renal origin: Secondary | ICD-10-CM | POA: Diagnosis not present

## 2019-02-16 DIAGNOSIS — D631 Anemia in chronic kidney disease: Secondary | ICD-10-CM | POA: Diagnosis not present

## 2019-02-17 DIAGNOSIS — N2581 Secondary hyperparathyroidism of renal origin: Secondary | ICD-10-CM | POA: Diagnosis not present

## 2019-02-17 DIAGNOSIS — Z418 Encounter for other procedures for purposes other than remedying health state: Secondary | ICD-10-CM | POA: Diagnosis not present

## 2019-02-17 DIAGNOSIS — N186 End stage renal disease: Secondary | ICD-10-CM | POA: Diagnosis not present

## 2019-02-17 DIAGNOSIS — D631 Anemia in chronic kidney disease: Secondary | ICD-10-CM | POA: Diagnosis not present

## 2019-02-18 DIAGNOSIS — N2581 Secondary hyperparathyroidism of renal origin: Secondary | ICD-10-CM | POA: Diagnosis not present

## 2019-02-18 DIAGNOSIS — D631 Anemia in chronic kidney disease: Secondary | ICD-10-CM | POA: Diagnosis not present

## 2019-02-18 DIAGNOSIS — Z418 Encounter for other procedures for purposes other than remedying health state: Secondary | ICD-10-CM | POA: Diagnosis not present

## 2019-02-18 DIAGNOSIS — N186 End stage renal disease: Secondary | ICD-10-CM | POA: Diagnosis not present

## 2019-02-19 DIAGNOSIS — N2581 Secondary hyperparathyroidism of renal origin: Secondary | ICD-10-CM | POA: Diagnosis not present

## 2019-02-19 DIAGNOSIS — N186 End stage renal disease: Secondary | ICD-10-CM | POA: Diagnosis not present

## 2019-02-19 DIAGNOSIS — Z418 Encounter for other procedures for purposes other than remedying health state: Secondary | ICD-10-CM | POA: Diagnosis not present

## 2019-02-19 DIAGNOSIS — D631 Anemia in chronic kidney disease: Secondary | ICD-10-CM | POA: Diagnosis not present

## 2019-02-20 DIAGNOSIS — N2581 Secondary hyperparathyroidism of renal origin: Secondary | ICD-10-CM | POA: Diagnosis not present

## 2019-02-20 DIAGNOSIS — Z992 Dependence on renal dialysis: Secondary | ICD-10-CM | POA: Diagnosis not present

## 2019-02-20 DIAGNOSIS — D631 Anemia in chronic kidney disease: Secondary | ICD-10-CM | POA: Diagnosis not present

## 2019-02-20 DIAGNOSIS — Z418 Encounter for other procedures for purposes other than remedying health state: Secondary | ICD-10-CM | POA: Diagnosis not present

## 2019-02-20 DIAGNOSIS — N186 End stage renal disease: Secondary | ICD-10-CM | POA: Diagnosis not present

## 2019-02-21 DIAGNOSIS — D631 Anemia in chronic kidney disease: Secondary | ICD-10-CM | POA: Diagnosis not present

## 2019-02-21 DIAGNOSIS — N186 End stage renal disease: Secondary | ICD-10-CM | POA: Diagnosis not present

## 2019-02-21 DIAGNOSIS — N2581 Secondary hyperparathyroidism of renal origin: Secondary | ICD-10-CM | POA: Diagnosis not present

## 2019-02-22 DIAGNOSIS — N2581 Secondary hyperparathyroidism of renal origin: Secondary | ICD-10-CM | POA: Diagnosis not present

## 2019-02-22 DIAGNOSIS — N186 End stage renal disease: Secondary | ICD-10-CM | POA: Diagnosis not present

## 2019-02-22 DIAGNOSIS — D631 Anemia in chronic kidney disease: Secondary | ICD-10-CM | POA: Diagnosis not present

## 2019-02-23 DIAGNOSIS — D631 Anemia in chronic kidney disease: Secondary | ICD-10-CM | POA: Diagnosis not present

## 2019-02-23 DIAGNOSIS — N2581 Secondary hyperparathyroidism of renal origin: Secondary | ICD-10-CM | POA: Diagnosis not present

## 2019-02-23 DIAGNOSIS — N186 End stage renal disease: Secondary | ICD-10-CM | POA: Diagnosis not present

## 2019-02-24 DIAGNOSIS — D631 Anemia in chronic kidney disease: Secondary | ICD-10-CM | POA: Diagnosis not present

## 2019-02-24 DIAGNOSIS — N2581 Secondary hyperparathyroidism of renal origin: Secondary | ICD-10-CM | POA: Diagnosis not present

## 2019-02-24 DIAGNOSIS — N186 End stage renal disease: Secondary | ICD-10-CM | POA: Diagnosis not present

## 2019-02-25 DIAGNOSIS — N2581 Secondary hyperparathyroidism of renal origin: Secondary | ICD-10-CM | POA: Diagnosis not present

## 2019-02-25 DIAGNOSIS — D631 Anemia in chronic kidney disease: Secondary | ICD-10-CM | POA: Diagnosis not present

## 2019-02-25 DIAGNOSIS — N186 End stage renal disease: Secondary | ICD-10-CM | POA: Diagnosis not present

## 2019-02-26 DIAGNOSIS — N186 End stage renal disease: Secondary | ICD-10-CM | POA: Diagnosis not present

## 2019-02-26 DIAGNOSIS — N2581 Secondary hyperparathyroidism of renal origin: Secondary | ICD-10-CM | POA: Diagnosis not present

## 2019-02-26 DIAGNOSIS — D631 Anemia in chronic kidney disease: Secondary | ICD-10-CM | POA: Diagnosis not present

## 2019-02-27 DIAGNOSIS — N186 End stage renal disease: Secondary | ICD-10-CM | POA: Diagnosis not present

## 2019-02-27 DIAGNOSIS — N2581 Secondary hyperparathyroidism of renal origin: Secondary | ICD-10-CM | POA: Diagnosis not present

## 2019-02-27 DIAGNOSIS — D631 Anemia in chronic kidney disease: Secondary | ICD-10-CM | POA: Diagnosis not present

## 2019-02-28 DIAGNOSIS — N2581 Secondary hyperparathyroidism of renal origin: Secondary | ICD-10-CM | POA: Diagnosis not present

## 2019-02-28 DIAGNOSIS — N186 End stage renal disease: Secondary | ICD-10-CM | POA: Diagnosis not present

## 2019-02-28 DIAGNOSIS — Z1159 Encounter for screening for other viral diseases: Secondary | ICD-10-CM | POA: Diagnosis not present

## 2019-02-28 DIAGNOSIS — D631 Anemia in chronic kidney disease: Secondary | ICD-10-CM | POA: Diagnosis not present

## 2019-02-28 DIAGNOSIS — Z114 Encounter for screening for human immunodeficiency virus [HIV]: Secondary | ICD-10-CM | POA: Diagnosis not present

## 2019-03-01 DIAGNOSIS — N2581 Secondary hyperparathyroidism of renal origin: Secondary | ICD-10-CM | POA: Diagnosis not present

## 2019-03-01 DIAGNOSIS — D631 Anemia in chronic kidney disease: Secondary | ICD-10-CM | POA: Diagnosis not present

## 2019-03-01 DIAGNOSIS — N186 End stage renal disease: Secondary | ICD-10-CM | POA: Diagnosis not present

## 2019-03-02 DIAGNOSIS — D631 Anemia in chronic kidney disease: Secondary | ICD-10-CM | POA: Diagnosis not present

## 2019-03-02 DIAGNOSIS — N186 End stage renal disease: Secondary | ICD-10-CM | POA: Diagnosis not present

## 2019-03-02 DIAGNOSIS — N2581 Secondary hyperparathyroidism of renal origin: Secondary | ICD-10-CM | POA: Diagnosis not present

## 2019-03-03 DIAGNOSIS — D631 Anemia in chronic kidney disease: Secondary | ICD-10-CM | POA: Diagnosis not present

## 2019-03-03 DIAGNOSIS — N2581 Secondary hyperparathyroidism of renal origin: Secondary | ICD-10-CM | POA: Diagnosis not present

## 2019-03-03 DIAGNOSIS — N186 End stage renal disease: Secondary | ICD-10-CM | POA: Diagnosis not present

## 2019-03-04 DIAGNOSIS — D631 Anemia in chronic kidney disease: Secondary | ICD-10-CM | POA: Diagnosis not present

## 2019-03-04 DIAGNOSIS — N2581 Secondary hyperparathyroidism of renal origin: Secondary | ICD-10-CM | POA: Diagnosis not present

## 2019-03-04 DIAGNOSIS — N186 End stage renal disease: Secondary | ICD-10-CM | POA: Diagnosis not present

## 2019-03-05 DIAGNOSIS — N2581 Secondary hyperparathyroidism of renal origin: Secondary | ICD-10-CM | POA: Diagnosis not present

## 2019-03-05 DIAGNOSIS — N186 End stage renal disease: Secondary | ICD-10-CM | POA: Diagnosis not present

## 2019-03-05 DIAGNOSIS — D631 Anemia in chronic kidney disease: Secondary | ICD-10-CM | POA: Diagnosis not present

## 2019-03-06 DIAGNOSIS — N2581 Secondary hyperparathyroidism of renal origin: Secondary | ICD-10-CM | POA: Diagnosis not present

## 2019-03-06 DIAGNOSIS — N186 End stage renal disease: Secondary | ICD-10-CM | POA: Diagnosis not present

## 2019-03-06 DIAGNOSIS — D631 Anemia in chronic kidney disease: Secondary | ICD-10-CM | POA: Diagnosis not present

## 2019-03-07 DIAGNOSIS — N186 End stage renal disease: Secondary | ICD-10-CM | POA: Diagnosis not present

## 2019-03-07 DIAGNOSIS — N2581 Secondary hyperparathyroidism of renal origin: Secondary | ICD-10-CM | POA: Diagnosis not present

## 2019-03-07 DIAGNOSIS — D631 Anemia in chronic kidney disease: Secondary | ICD-10-CM | POA: Diagnosis not present

## 2019-03-08 DIAGNOSIS — N186 End stage renal disease: Secondary | ICD-10-CM | POA: Diagnosis not present

## 2019-03-08 DIAGNOSIS — N2581 Secondary hyperparathyroidism of renal origin: Secondary | ICD-10-CM | POA: Diagnosis not present

## 2019-03-08 DIAGNOSIS — D631 Anemia in chronic kidney disease: Secondary | ICD-10-CM | POA: Diagnosis not present

## 2019-03-09 DIAGNOSIS — D631 Anemia in chronic kidney disease: Secondary | ICD-10-CM | POA: Diagnosis not present

## 2019-03-09 DIAGNOSIS — N2581 Secondary hyperparathyroidism of renal origin: Secondary | ICD-10-CM | POA: Diagnosis not present

## 2019-03-09 DIAGNOSIS — N186 End stage renal disease: Secondary | ICD-10-CM | POA: Diagnosis not present

## 2019-03-10 DIAGNOSIS — D631 Anemia in chronic kidney disease: Secondary | ICD-10-CM | POA: Diagnosis not present

## 2019-03-10 DIAGNOSIS — N186 End stage renal disease: Secondary | ICD-10-CM | POA: Diagnosis not present

## 2019-03-10 DIAGNOSIS — N2581 Secondary hyperparathyroidism of renal origin: Secondary | ICD-10-CM | POA: Diagnosis not present

## 2019-03-11 DIAGNOSIS — N2581 Secondary hyperparathyroidism of renal origin: Secondary | ICD-10-CM | POA: Diagnosis not present

## 2019-03-11 DIAGNOSIS — D631 Anemia in chronic kidney disease: Secondary | ICD-10-CM | POA: Diagnosis not present

## 2019-03-11 DIAGNOSIS — N186 End stage renal disease: Secondary | ICD-10-CM | POA: Diagnosis not present

## 2019-03-12 DIAGNOSIS — D631 Anemia in chronic kidney disease: Secondary | ICD-10-CM | POA: Diagnosis not present

## 2019-03-12 DIAGNOSIS — N2581 Secondary hyperparathyroidism of renal origin: Secondary | ICD-10-CM | POA: Diagnosis not present

## 2019-03-12 DIAGNOSIS — N186 End stage renal disease: Secondary | ICD-10-CM | POA: Diagnosis not present

## 2019-03-13 DIAGNOSIS — N2581 Secondary hyperparathyroidism of renal origin: Secondary | ICD-10-CM | POA: Diagnosis not present

## 2019-03-13 DIAGNOSIS — D631 Anemia in chronic kidney disease: Secondary | ICD-10-CM | POA: Diagnosis not present

## 2019-03-13 DIAGNOSIS — N186 End stage renal disease: Secondary | ICD-10-CM | POA: Diagnosis not present

## 2019-03-14 DIAGNOSIS — N2581 Secondary hyperparathyroidism of renal origin: Secondary | ICD-10-CM | POA: Diagnosis not present

## 2019-03-14 DIAGNOSIS — D631 Anemia in chronic kidney disease: Secondary | ICD-10-CM | POA: Diagnosis not present

## 2019-03-14 DIAGNOSIS — N186 End stage renal disease: Secondary | ICD-10-CM | POA: Diagnosis not present

## 2019-03-15 DIAGNOSIS — N186 End stage renal disease: Secondary | ICD-10-CM | POA: Diagnosis not present

## 2019-03-15 DIAGNOSIS — N2581 Secondary hyperparathyroidism of renal origin: Secondary | ICD-10-CM | POA: Diagnosis not present

## 2019-03-15 DIAGNOSIS — D631 Anemia in chronic kidney disease: Secondary | ICD-10-CM | POA: Diagnosis not present

## 2019-03-16 DIAGNOSIS — D631 Anemia in chronic kidney disease: Secondary | ICD-10-CM | POA: Diagnosis not present

## 2019-03-16 DIAGNOSIS — N186 End stage renal disease: Secondary | ICD-10-CM | POA: Diagnosis not present

## 2019-03-16 DIAGNOSIS — N2581 Secondary hyperparathyroidism of renal origin: Secondary | ICD-10-CM | POA: Diagnosis not present

## 2019-03-17 DIAGNOSIS — N2581 Secondary hyperparathyroidism of renal origin: Secondary | ICD-10-CM | POA: Diagnosis not present

## 2019-03-17 DIAGNOSIS — D631 Anemia in chronic kidney disease: Secondary | ICD-10-CM | POA: Diagnosis not present

## 2019-03-17 DIAGNOSIS — N186 End stage renal disease: Secondary | ICD-10-CM | POA: Diagnosis not present

## 2019-03-18 DIAGNOSIS — N2581 Secondary hyperparathyroidism of renal origin: Secondary | ICD-10-CM | POA: Diagnosis not present

## 2019-03-18 DIAGNOSIS — D631 Anemia in chronic kidney disease: Secondary | ICD-10-CM | POA: Diagnosis not present

## 2019-03-18 DIAGNOSIS — N186 End stage renal disease: Secondary | ICD-10-CM | POA: Diagnosis not present

## 2019-03-19 DIAGNOSIS — D631 Anemia in chronic kidney disease: Secondary | ICD-10-CM | POA: Diagnosis not present

## 2019-03-19 DIAGNOSIS — N2581 Secondary hyperparathyroidism of renal origin: Secondary | ICD-10-CM | POA: Diagnosis not present

## 2019-03-19 DIAGNOSIS — N186 End stage renal disease: Secondary | ICD-10-CM | POA: Diagnosis not present

## 2019-03-20 DIAGNOSIS — N186 End stage renal disease: Secondary | ICD-10-CM | POA: Diagnosis not present

## 2019-03-20 DIAGNOSIS — N2581 Secondary hyperparathyroidism of renal origin: Secondary | ICD-10-CM | POA: Diagnosis not present

## 2019-03-20 DIAGNOSIS — D631 Anemia in chronic kidney disease: Secondary | ICD-10-CM | POA: Diagnosis not present

## 2019-03-21 DIAGNOSIS — N186 End stage renal disease: Secondary | ICD-10-CM | POA: Diagnosis not present

## 2019-03-21 DIAGNOSIS — N2581 Secondary hyperparathyroidism of renal origin: Secondary | ICD-10-CM | POA: Diagnosis not present

## 2019-03-21 DIAGNOSIS — D631 Anemia in chronic kidney disease: Secondary | ICD-10-CM | POA: Diagnosis not present

## 2019-03-22 DIAGNOSIS — D631 Anemia in chronic kidney disease: Secondary | ICD-10-CM | POA: Diagnosis not present

## 2019-03-22 DIAGNOSIS — N186 End stage renal disease: Secondary | ICD-10-CM | POA: Diagnosis not present

## 2019-03-22 DIAGNOSIS — N2581 Secondary hyperparathyroidism of renal origin: Secondary | ICD-10-CM | POA: Diagnosis not present

## 2019-03-23 DIAGNOSIS — D631 Anemia in chronic kidney disease: Secondary | ICD-10-CM | POA: Diagnosis not present

## 2019-03-23 DIAGNOSIS — N2581 Secondary hyperparathyroidism of renal origin: Secondary | ICD-10-CM | POA: Diagnosis not present

## 2019-03-23 DIAGNOSIS — N186 End stage renal disease: Secondary | ICD-10-CM | POA: Diagnosis not present

## 2019-03-23 DIAGNOSIS — Z992 Dependence on renal dialysis: Secondary | ICD-10-CM | POA: Diagnosis not present

## 2019-03-24 DIAGNOSIS — N186 End stage renal disease: Secondary | ICD-10-CM | POA: Diagnosis not present

## 2019-03-24 DIAGNOSIS — N2581 Secondary hyperparathyroidism of renal origin: Secondary | ICD-10-CM | POA: Diagnosis not present

## 2019-03-24 DIAGNOSIS — D631 Anemia in chronic kidney disease: Secondary | ICD-10-CM | POA: Diagnosis not present

## 2019-03-25 DIAGNOSIS — N186 End stage renal disease: Secondary | ICD-10-CM | POA: Diagnosis not present

## 2019-03-25 DIAGNOSIS — N2581 Secondary hyperparathyroidism of renal origin: Secondary | ICD-10-CM | POA: Diagnosis not present

## 2019-03-25 DIAGNOSIS — D631 Anemia in chronic kidney disease: Secondary | ICD-10-CM | POA: Diagnosis not present

## 2019-03-26 DIAGNOSIS — N186 End stage renal disease: Secondary | ICD-10-CM | POA: Diagnosis not present

## 2019-03-26 DIAGNOSIS — N2581 Secondary hyperparathyroidism of renal origin: Secondary | ICD-10-CM | POA: Diagnosis not present

## 2019-03-26 DIAGNOSIS — D631 Anemia in chronic kidney disease: Secondary | ICD-10-CM | POA: Diagnosis not present

## 2019-03-27 DIAGNOSIS — D631 Anemia in chronic kidney disease: Secondary | ICD-10-CM | POA: Diagnosis not present

## 2019-03-27 DIAGNOSIS — N186 End stage renal disease: Secondary | ICD-10-CM | POA: Diagnosis not present

## 2019-03-27 DIAGNOSIS — N2581 Secondary hyperparathyroidism of renal origin: Secondary | ICD-10-CM | POA: Diagnosis not present

## 2019-03-28 DIAGNOSIS — N2581 Secondary hyperparathyroidism of renal origin: Secondary | ICD-10-CM | POA: Diagnosis not present

## 2019-03-28 DIAGNOSIS — D631 Anemia in chronic kidney disease: Secondary | ICD-10-CM | POA: Diagnosis not present

## 2019-03-28 DIAGNOSIS — N186 End stage renal disease: Secondary | ICD-10-CM | POA: Diagnosis not present

## 2019-03-29 DIAGNOSIS — N2581 Secondary hyperparathyroidism of renal origin: Secondary | ICD-10-CM | POA: Diagnosis not present

## 2019-03-29 DIAGNOSIS — D631 Anemia in chronic kidney disease: Secondary | ICD-10-CM | POA: Diagnosis not present

## 2019-03-29 DIAGNOSIS — N186 End stage renal disease: Secondary | ICD-10-CM | POA: Diagnosis not present

## 2019-03-30 DIAGNOSIS — D631 Anemia in chronic kidney disease: Secondary | ICD-10-CM | POA: Diagnosis not present

## 2019-03-30 DIAGNOSIS — N186 End stage renal disease: Secondary | ICD-10-CM | POA: Diagnosis not present

## 2019-03-30 DIAGNOSIS — N2581 Secondary hyperparathyroidism of renal origin: Secondary | ICD-10-CM | POA: Diagnosis not present

## 2019-03-31 DIAGNOSIS — N186 End stage renal disease: Secondary | ICD-10-CM | POA: Diagnosis not present

## 2019-03-31 DIAGNOSIS — N2581 Secondary hyperparathyroidism of renal origin: Secondary | ICD-10-CM | POA: Diagnosis not present

## 2019-03-31 DIAGNOSIS — D631 Anemia in chronic kidney disease: Secondary | ICD-10-CM | POA: Diagnosis not present

## 2019-04-01 DIAGNOSIS — N2581 Secondary hyperparathyroidism of renal origin: Secondary | ICD-10-CM | POA: Diagnosis not present

## 2019-04-01 DIAGNOSIS — N186 End stage renal disease: Secondary | ICD-10-CM | POA: Diagnosis not present

## 2019-04-01 DIAGNOSIS — D631 Anemia in chronic kidney disease: Secondary | ICD-10-CM | POA: Diagnosis not present

## 2019-04-02 DIAGNOSIS — D631 Anemia in chronic kidney disease: Secondary | ICD-10-CM | POA: Diagnosis not present

## 2019-04-02 DIAGNOSIS — N186 End stage renal disease: Secondary | ICD-10-CM | POA: Diagnosis not present

## 2019-04-02 DIAGNOSIS — N2581 Secondary hyperparathyroidism of renal origin: Secondary | ICD-10-CM | POA: Diagnosis not present

## 2019-04-03 DIAGNOSIS — D539 Nutritional anemia, unspecified: Secondary | ICD-10-CM | POA: Diagnosis not present

## 2019-04-03 DIAGNOSIS — N186 End stage renal disease: Secondary | ICD-10-CM | POA: Diagnosis not present

## 2019-04-03 DIAGNOSIS — R569 Unspecified convulsions: Secondary | ICD-10-CM | POA: Diagnosis not present

## 2019-04-03 DIAGNOSIS — Z3042 Encounter for surveillance of injectable contraceptive: Secondary | ICD-10-CM | POA: Diagnosis not present

## 2019-04-03 DIAGNOSIS — N2581 Secondary hyperparathyroidism of renal origin: Secondary | ICD-10-CM | POA: Diagnosis not present

## 2019-04-03 DIAGNOSIS — D631 Anemia in chronic kidney disease: Secondary | ICD-10-CM | POA: Diagnosis not present

## 2019-04-03 DIAGNOSIS — E039 Hypothyroidism, unspecified: Secondary | ICD-10-CM | POA: Diagnosis not present

## 2019-04-03 DIAGNOSIS — N926 Irregular menstruation, unspecified: Secondary | ICD-10-CM | POA: Diagnosis not present

## 2019-04-03 DIAGNOSIS — E782 Mixed hyperlipidemia: Secondary | ICD-10-CM | POA: Diagnosis not present

## 2019-04-04 DIAGNOSIS — N2581 Secondary hyperparathyroidism of renal origin: Secondary | ICD-10-CM | POA: Diagnosis not present

## 2019-04-04 DIAGNOSIS — D631 Anemia in chronic kidney disease: Secondary | ICD-10-CM | POA: Diagnosis not present

## 2019-04-04 DIAGNOSIS — N186 End stage renal disease: Secondary | ICD-10-CM | POA: Diagnosis not present

## 2019-04-05 DIAGNOSIS — N186 End stage renal disease: Secondary | ICD-10-CM | POA: Diagnosis not present

## 2019-04-05 DIAGNOSIS — D631 Anemia in chronic kidney disease: Secondary | ICD-10-CM | POA: Diagnosis not present

## 2019-04-05 DIAGNOSIS — N2581 Secondary hyperparathyroidism of renal origin: Secondary | ICD-10-CM | POA: Diagnosis not present

## 2019-04-06 DIAGNOSIS — N186 End stage renal disease: Secondary | ICD-10-CM | POA: Diagnosis not present

## 2019-04-06 DIAGNOSIS — N2581 Secondary hyperparathyroidism of renal origin: Secondary | ICD-10-CM | POA: Diagnosis not present

## 2019-04-06 DIAGNOSIS — D631 Anemia in chronic kidney disease: Secondary | ICD-10-CM | POA: Diagnosis not present

## 2019-04-07 DIAGNOSIS — N2581 Secondary hyperparathyroidism of renal origin: Secondary | ICD-10-CM | POA: Diagnosis not present

## 2019-04-07 DIAGNOSIS — N186 End stage renal disease: Secondary | ICD-10-CM | POA: Diagnosis not present

## 2019-04-07 DIAGNOSIS — D631 Anemia in chronic kidney disease: Secondary | ICD-10-CM | POA: Diagnosis not present

## 2019-04-08 DIAGNOSIS — N186 End stage renal disease: Secondary | ICD-10-CM | POA: Diagnosis not present

## 2019-04-08 DIAGNOSIS — D631 Anemia in chronic kidney disease: Secondary | ICD-10-CM | POA: Diagnosis not present

## 2019-04-08 DIAGNOSIS — N2581 Secondary hyperparathyroidism of renal origin: Secondary | ICD-10-CM | POA: Diagnosis not present

## 2019-04-09 DIAGNOSIS — N2581 Secondary hyperparathyroidism of renal origin: Secondary | ICD-10-CM | POA: Diagnosis not present

## 2019-04-09 DIAGNOSIS — D631 Anemia in chronic kidney disease: Secondary | ICD-10-CM | POA: Diagnosis not present

## 2019-04-09 DIAGNOSIS — N186 End stage renal disease: Secondary | ICD-10-CM | POA: Diagnosis not present

## 2019-04-10 DIAGNOSIS — D631 Anemia in chronic kidney disease: Secondary | ICD-10-CM | POA: Diagnosis not present

## 2019-04-10 DIAGNOSIS — N186 End stage renal disease: Secondary | ICD-10-CM | POA: Diagnosis not present

## 2019-04-10 DIAGNOSIS — N2581 Secondary hyperparathyroidism of renal origin: Secondary | ICD-10-CM | POA: Diagnosis not present

## 2019-04-11 DIAGNOSIS — N2581 Secondary hyperparathyroidism of renal origin: Secondary | ICD-10-CM | POA: Diagnosis not present

## 2019-04-11 DIAGNOSIS — N186 End stage renal disease: Secondary | ICD-10-CM | POA: Diagnosis not present

## 2019-04-11 DIAGNOSIS — D631 Anemia in chronic kidney disease: Secondary | ICD-10-CM | POA: Diagnosis not present

## 2019-04-12 DIAGNOSIS — N186 End stage renal disease: Secondary | ICD-10-CM | POA: Diagnosis not present

## 2019-04-12 DIAGNOSIS — D631 Anemia in chronic kidney disease: Secondary | ICD-10-CM | POA: Diagnosis not present

## 2019-04-12 DIAGNOSIS — N2581 Secondary hyperparathyroidism of renal origin: Secondary | ICD-10-CM | POA: Diagnosis not present

## 2019-04-13 DIAGNOSIS — N2581 Secondary hyperparathyroidism of renal origin: Secondary | ICD-10-CM | POA: Diagnosis not present

## 2019-04-13 DIAGNOSIS — D631 Anemia in chronic kidney disease: Secondary | ICD-10-CM | POA: Diagnosis not present

## 2019-04-13 DIAGNOSIS — N186 End stage renal disease: Secondary | ICD-10-CM | POA: Diagnosis not present

## 2019-04-14 DIAGNOSIS — N186 End stage renal disease: Secondary | ICD-10-CM | POA: Diagnosis not present

## 2019-04-14 DIAGNOSIS — N2581 Secondary hyperparathyroidism of renal origin: Secondary | ICD-10-CM | POA: Diagnosis not present

## 2019-04-14 DIAGNOSIS — D631 Anemia in chronic kidney disease: Secondary | ICD-10-CM | POA: Diagnosis not present

## 2019-04-15 DIAGNOSIS — N2581 Secondary hyperparathyroidism of renal origin: Secondary | ICD-10-CM | POA: Diagnosis not present

## 2019-04-15 DIAGNOSIS — D631 Anemia in chronic kidney disease: Secondary | ICD-10-CM | POA: Diagnosis not present

## 2019-04-15 DIAGNOSIS — N186 End stage renal disease: Secondary | ICD-10-CM | POA: Diagnosis not present

## 2019-04-16 DIAGNOSIS — N2581 Secondary hyperparathyroidism of renal origin: Secondary | ICD-10-CM | POA: Diagnosis not present

## 2019-04-16 DIAGNOSIS — N186 End stage renal disease: Secondary | ICD-10-CM | POA: Diagnosis not present

## 2019-04-16 DIAGNOSIS — D631 Anemia in chronic kidney disease: Secondary | ICD-10-CM | POA: Diagnosis not present

## 2019-04-17 DIAGNOSIS — N2581 Secondary hyperparathyroidism of renal origin: Secondary | ICD-10-CM | POA: Diagnosis not present

## 2019-04-17 DIAGNOSIS — D631 Anemia in chronic kidney disease: Secondary | ICD-10-CM | POA: Diagnosis not present

## 2019-04-17 DIAGNOSIS — N186 End stage renal disease: Secondary | ICD-10-CM | POA: Diagnosis not present

## 2019-04-18 DIAGNOSIS — F319 Bipolar disorder, unspecified: Secondary | ICD-10-CM | POA: Diagnosis not present

## 2019-04-18 DIAGNOSIS — D631 Anemia in chronic kidney disease: Secondary | ICD-10-CM | POA: Diagnosis not present

## 2019-04-18 DIAGNOSIS — F250 Schizoaffective disorder, bipolar type: Secondary | ICD-10-CM | POA: Diagnosis not present

## 2019-04-18 DIAGNOSIS — F29 Unspecified psychosis not due to a substance or known physiological condition: Secondary | ICD-10-CM | POA: Diagnosis not present

## 2019-04-18 DIAGNOSIS — F71 Moderate intellectual disabilities: Secondary | ICD-10-CM | POA: Diagnosis not present

## 2019-04-18 DIAGNOSIS — N2581 Secondary hyperparathyroidism of renal origin: Secondary | ICD-10-CM | POA: Diagnosis not present

## 2019-04-18 DIAGNOSIS — N186 End stage renal disease: Secondary | ICD-10-CM | POA: Diagnosis not present

## 2019-04-19 DIAGNOSIS — D631 Anemia in chronic kidney disease: Secondary | ICD-10-CM | POA: Diagnosis not present

## 2019-04-19 DIAGNOSIS — N186 End stage renal disease: Secondary | ICD-10-CM | POA: Diagnosis not present

## 2019-04-19 DIAGNOSIS — N2581 Secondary hyperparathyroidism of renal origin: Secondary | ICD-10-CM | POA: Diagnosis not present

## 2019-04-20 DIAGNOSIS — D631 Anemia in chronic kidney disease: Secondary | ICD-10-CM | POA: Diagnosis not present

## 2019-04-20 DIAGNOSIS — N186 End stage renal disease: Secondary | ICD-10-CM | POA: Diagnosis not present

## 2019-04-20 DIAGNOSIS — N2581 Secondary hyperparathyroidism of renal origin: Secondary | ICD-10-CM | POA: Diagnosis not present

## 2019-04-21 DIAGNOSIS — N186 End stage renal disease: Secondary | ICD-10-CM | POA: Diagnosis not present

## 2019-04-21 DIAGNOSIS — N2581 Secondary hyperparathyroidism of renal origin: Secondary | ICD-10-CM | POA: Diagnosis not present

## 2019-04-21 DIAGNOSIS — D631 Anemia in chronic kidney disease: Secondary | ICD-10-CM | POA: Diagnosis not present

## 2019-04-22 DIAGNOSIS — N2581 Secondary hyperparathyroidism of renal origin: Secondary | ICD-10-CM | POA: Diagnosis not present

## 2019-04-22 DIAGNOSIS — N186 End stage renal disease: Secondary | ICD-10-CM | POA: Diagnosis not present

## 2019-04-22 DIAGNOSIS — Z992 Dependence on renal dialysis: Secondary | ICD-10-CM | POA: Diagnosis not present

## 2019-04-22 DIAGNOSIS — D631 Anemia in chronic kidney disease: Secondary | ICD-10-CM | POA: Diagnosis not present

## 2019-04-23 DIAGNOSIS — N2581 Secondary hyperparathyroidism of renal origin: Secondary | ICD-10-CM | POA: Diagnosis not present

## 2019-04-23 DIAGNOSIS — D631 Anemia in chronic kidney disease: Secondary | ICD-10-CM | POA: Diagnosis not present

## 2019-04-23 DIAGNOSIS — N186 End stage renal disease: Secondary | ICD-10-CM | POA: Diagnosis not present

## 2019-04-24 DIAGNOSIS — N2581 Secondary hyperparathyroidism of renal origin: Secondary | ICD-10-CM | POA: Diagnosis not present

## 2019-04-24 DIAGNOSIS — N186 End stage renal disease: Secondary | ICD-10-CM | POA: Diagnosis not present

## 2019-04-24 DIAGNOSIS — D631 Anemia in chronic kidney disease: Secondary | ICD-10-CM | POA: Diagnosis not present

## 2019-04-25 DIAGNOSIS — N186 End stage renal disease: Secondary | ICD-10-CM | POA: Diagnosis not present

## 2019-04-25 DIAGNOSIS — Z4932 Encounter for adequacy testing for peritoneal dialysis: Secondary | ICD-10-CM | POA: Diagnosis not present

## 2019-04-25 DIAGNOSIS — D631 Anemia in chronic kidney disease: Secondary | ICD-10-CM | POA: Diagnosis not present

## 2019-04-25 DIAGNOSIS — N2581 Secondary hyperparathyroidism of renal origin: Secondary | ICD-10-CM | POA: Diagnosis not present

## 2019-04-26 DIAGNOSIS — D631 Anemia in chronic kidney disease: Secondary | ICD-10-CM | POA: Diagnosis not present

## 2019-04-26 DIAGNOSIS — N186 End stage renal disease: Secondary | ICD-10-CM | POA: Diagnosis not present

## 2019-04-26 DIAGNOSIS — N2581 Secondary hyperparathyroidism of renal origin: Secondary | ICD-10-CM | POA: Diagnosis not present

## 2019-04-27 DIAGNOSIS — D631 Anemia in chronic kidney disease: Secondary | ICD-10-CM | POA: Diagnosis not present

## 2019-04-27 DIAGNOSIS — N186 End stage renal disease: Secondary | ICD-10-CM | POA: Diagnosis not present

## 2019-04-27 DIAGNOSIS — N2581 Secondary hyperparathyroidism of renal origin: Secondary | ICD-10-CM | POA: Diagnosis not present

## 2019-04-28 ENCOUNTER — Ambulatory Visit (INDEPENDENT_AMBULATORY_CARE_PROVIDER_SITE_OTHER): Payer: Medicare Other | Admitting: Nurse Practitioner

## 2019-04-28 ENCOUNTER — Encounter: Payer: Self-pay | Admitting: Nurse Practitioner

## 2019-04-28 ENCOUNTER — Other Ambulatory Visit: Payer: Self-pay

## 2019-04-28 DIAGNOSIS — N2581 Secondary hyperparathyroidism of renal origin: Secondary | ICD-10-CM | POA: Diagnosis not present

## 2019-04-28 DIAGNOSIS — N186 End stage renal disease: Secondary | ICD-10-CM | POA: Diagnosis not present

## 2019-04-28 DIAGNOSIS — D631 Anemia in chronic kidney disease: Secondary | ICD-10-CM | POA: Diagnosis not present

## 2019-04-28 DIAGNOSIS — G4733 Obstructive sleep apnea (adult) (pediatric): Secondary | ICD-10-CM

## 2019-04-28 NOTE — Progress Notes (Addendum)
Virtual Visit via Telephone Note  I connected with Jane [name] on 04/28/19 at  9:00 AM EDT by telephone and verified that I am speaking with the correct person using two identifiers.  Location: Patient: home Provider: office   I discussed the limitations, risks, security and privacy concerns of performing an evaluation and management service by telephone and the availability of in person appointments. I also discussed with the patient that there may be a patient responsible charge related to this service. The patient expressed understanding and agreed to proceed.   History of Present Illness: 53 year old female OSA and end-stage renal disease who is followed by Dr. Elsworth Soho. PMH: Schizophrenia/mentally disabled, continue on dialysis 4 times daily occluding 1 exchange at night. CPAP Auto Set 8-15  cmH20   Patient has a tele-visit today for a follow-up on OSA.  Patient's CPAP download shows that she has not been compliant with CPAP.  She has not been wearing the CPAP machine.  She states this is because her chinstrap broke.  She states if she can get this fixed that she will be compliant with CPAP.  She still lives with her sister.  She denies any issues with pressure settings. Denies f/c/s, n/v/d, hemoptysis, PND, leg swelling.     Observations/Objective:  CPAP titration 01/13/18 - An optimal PAP pressure was selected for this patient ( 20 /16 cm of water). Events seemed controlled on CPAP 13 cm & centrals emerged at higher levels & required bilevel  - Mild Central Sleep Apnea was noted during this titration (CAI =  5.4/h).  - Significant oxygen desaturations were not observed during this  titration (min O2 = 90.0%).  HST 09/05/17 - AHI 20, SpO2 low at 72%  Assessment and Plan: Patient has a tele-visit today for a follow-up on OSA.  Patient's CPAP download shows that she has not been compliant with CPAP.  She has not been wearing the CPAP machine.  She states this is because her  chinstrap broke.  She states if she can get this fixed that she will be compliant with CPAP.  Patient Instructions  Patient has not used CPAP due to issues with chin strap Will order chin strap  Continue CPAP at current settings Continue current medications Goal of 4 hours or more usage per night Maintain healthy weight Do not drive if drowsy   Follow Up Instructions: Follow up with Dr. Elsworth Soho in 3 months or sooner if needed    I discussed the assessment and treatment plan with the patient. The patient was provided an opportunity to ask questions and all were answered. The patient agreed with the plan and demonstrated an understanding of the instructions.   The patient was advised to call back or seek an in-person evaluation if the symptoms worsen or if the condition fails to improve as anticipated.  I provided 22 minutes of non-face-to-face time during this encounter.   Fenton Foy, NP

## 2019-04-28 NOTE — Patient Instructions (Addendum)
Patient has not used CPAP due to issues with chin strap Will order chin strap  Continue CPAP at current settings Continue current medications Goal of 4 hours or more usage per night Maintain healthy weight Do not drive if drowsy Follow up with Dr. Elsworth Soho in 3 months or sooner if needed

## 2019-04-28 NOTE — Assessment & Plan Note (Signed)
Patient has a tele-visit today for a follow-up on OSA.  Patient's CPAP download shows that she has not been compliant with CPAP.  She has not been wearing the CPAP machine.  She states this is because her chinstrap broke.  She states if she can get this fixed that she will be compliant with CPAP.  Patient Instructions  Patient continues to benefit from CPAP with good compliance and control documented Will order chin strap  Continue CPAP at current settings Continue current medications Goal of 4 hours or more usage per night Maintain healthy weight Do not drive if drowsy Follow up with Dr. Elsworth Soho in 3 months or sooner if needed

## 2019-04-29 DIAGNOSIS — N186 End stage renal disease: Secondary | ICD-10-CM | POA: Diagnosis not present

## 2019-04-29 DIAGNOSIS — D631 Anemia in chronic kidney disease: Secondary | ICD-10-CM | POA: Diagnosis not present

## 2019-04-29 DIAGNOSIS — N2581 Secondary hyperparathyroidism of renal origin: Secondary | ICD-10-CM | POA: Diagnosis not present

## 2019-04-30 DIAGNOSIS — N2581 Secondary hyperparathyroidism of renal origin: Secondary | ICD-10-CM | POA: Diagnosis not present

## 2019-04-30 DIAGNOSIS — D631 Anemia in chronic kidney disease: Secondary | ICD-10-CM | POA: Diagnosis not present

## 2019-04-30 DIAGNOSIS — N186 End stage renal disease: Secondary | ICD-10-CM | POA: Diagnosis not present

## 2019-05-01 DIAGNOSIS — N186 End stage renal disease: Secondary | ICD-10-CM | POA: Diagnosis not present

## 2019-05-01 DIAGNOSIS — N2581 Secondary hyperparathyroidism of renal origin: Secondary | ICD-10-CM | POA: Diagnosis not present

## 2019-05-01 DIAGNOSIS — D631 Anemia in chronic kidney disease: Secondary | ICD-10-CM | POA: Diagnosis not present

## 2019-05-02 DIAGNOSIS — N2581 Secondary hyperparathyroidism of renal origin: Secondary | ICD-10-CM | POA: Diagnosis not present

## 2019-05-02 DIAGNOSIS — N186 End stage renal disease: Secondary | ICD-10-CM | POA: Diagnosis not present

## 2019-05-02 DIAGNOSIS — D631 Anemia in chronic kidney disease: Secondary | ICD-10-CM | POA: Diagnosis not present

## 2019-05-03 DIAGNOSIS — N186 End stage renal disease: Secondary | ICD-10-CM | POA: Diagnosis not present

## 2019-05-03 DIAGNOSIS — N2581 Secondary hyperparathyroidism of renal origin: Secondary | ICD-10-CM | POA: Diagnosis not present

## 2019-05-03 DIAGNOSIS — D631 Anemia in chronic kidney disease: Secondary | ICD-10-CM | POA: Diagnosis not present

## 2019-05-04 DIAGNOSIS — N186 End stage renal disease: Secondary | ICD-10-CM | POA: Diagnosis not present

## 2019-05-04 DIAGNOSIS — N2581 Secondary hyperparathyroidism of renal origin: Secondary | ICD-10-CM | POA: Diagnosis not present

## 2019-05-04 DIAGNOSIS — D631 Anemia in chronic kidney disease: Secondary | ICD-10-CM | POA: Diagnosis not present

## 2019-05-05 DIAGNOSIS — N2581 Secondary hyperparathyroidism of renal origin: Secondary | ICD-10-CM | POA: Diagnosis not present

## 2019-05-05 DIAGNOSIS — D631 Anemia in chronic kidney disease: Secondary | ICD-10-CM | POA: Diagnosis not present

## 2019-05-05 DIAGNOSIS — N186 End stage renal disease: Secondary | ICD-10-CM | POA: Diagnosis not present

## 2019-05-06 DIAGNOSIS — N186 End stage renal disease: Secondary | ICD-10-CM | POA: Diagnosis not present

## 2019-05-06 DIAGNOSIS — N2581 Secondary hyperparathyroidism of renal origin: Secondary | ICD-10-CM | POA: Diagnosis not present

## 2019-05-06 DIAGNOSIS — D631 Anemia in chronic kidney disease: Secondary | ICD-10-CM | POA: Diagnosis not present

## 2019-05-07 DIAGNOSIS — N2581 Secondary hyperparathyroidism of renal origin: Secondary | ICD-10-CM | POA: Diagnosis not present

## 2019-05-07 DIAGNOSIS — N186 End stage renal disease: Secondary | ICD-10-CM | POA: Diagnosis not present

## 2019-05-07 DIAGNOSIS — D631 Anemia in chronic kidney disease: Secondary | ICD-10-CM | POA: Diagnosis not present

## 2019-05-08 DIAGNOSIS — N2581 Secondary hyperparathyroidism of renal origin: Secondary | ICD-10-CM | POA: Diagnosis not present

## 2019-05-08 DIAGNOSIS — D631 Anemia in chronic kidney disease: Secondary | ICD-10-CM | POA: Diagnosis not present

## 2019-05-08 DIAGNOSIS — N186 End stage renal disease: Secondary | ICD-10-CM | POA: Diagnosis not present

## 2019-05-09 DIAGNOSIS — N186 End stage renal disease: Secondary | ICD-10-CM | POA: Diagnosis not present

## 2019-05-09 DIAGNOSIS — N2581 Secondary hyperparathyroidism of renal origin: Secondary | ICD-10-CM | POA: Diagnosis not present

## 2019-05-09 DIAGNOSIS — D631 Anemia in chronic kidney disease: Secondary | ICD-10-CM | POA: Diagnosis not present

## 2019-05-10 DIAGNOSIS — N186 End stage renal disease: Secondary | ICD-10-CM | POA: Diagnosis not present

## 2019-05-10 DIAGNOSIS — D631 Anemia in chronic kidney disease: Secondary | ICD-10-CM | POA: Diagnosis not present

## 2019-05-10 DIAGNOSIS — N2581 Secondary hyperparathyroidism of renal origin: Secondary | ICD-10-CM | POA: Diagnosis not present

## 2019-05-11 DIAGNOSIS — D631 Anemia in chronic kidney disease: Secondary | ICD-10-CM | POA: Diagnosis not present

## 2019-05-11 DIAGNOSIS — N2581 Secondary hyperparathyroidism of renal origin: Secondary | ICD-10-CM | POA: Diagnosis not present

## 2019-05-11 DIAGNOSIS — N186 End stage renal disease: Secondary | ICD-10-CM | POA: Diagnosis not present

## 2019-05-12 DIAGNOSIS — N2581 Secondary hyperparathyroidism of renal origin: Secondary | ICD-10-CM | POA: Diagnosis not present

## 2019-05-12 DIAGNOSIS — N186 End stage renal disease: Secondary | ICD-10-CM | POA: Diagnosis not present

## 2019-05-12 DIAGNOSIS — D631 Anemia in chronic kidney disease: Secondary | ICD-10-CM | POA: Diagnosis not present

## 2019-05-13 DIAGNOSIS — N186 End stage renal disease: Secondary | ICD-10-CM | POA: Diagnosis not present

## 2019-05-13 DIAGNOSIS — N2581 Secondary hyperparathyroidism of renal origin: Secondary | ICD-10-CM | POA: Diagnosis not present

## 2019-05-13 DIAGNOSIS — D631 Anemia in chronic kidney disease: Secondary | ICD-10-CM | POA: Diagnosis not present

## 2019-05-14 DIAGNOSIS — N2581 Secondary hyperparathyroidism of renal origin: Secondary | ICD-10-CM | POA: Diagnosis not present

## 2019-05-14 DIAGNOSIS — D631 Anemia in chronic kidney disease: Secondary | ICD-10-CM | POA: Diagnosis not present

## 2019-05-14 DIAGNOSIS — N186 End stage renal disease: Secondary | ICD-10-CM | POA: Diagnosis not present

## 2019-05-15 DIAGNOSIS — N186 End stage renal disease: Secondary | ICD-10-CM | POA: Diagnosis not present

## 2019-05-15 DIAGNOSIS — N2581 Secondary hyperparathyroidism of renal origin: Secondary | ICD-10-CM | POA: Diagnosis not present

## 2019-05-15 DIAGNOSIS — D631 Anemia in chronic kidney disease: Secondary | ICD-10-CM | POA: Diagnosis not present

## 2019-05-16 DIAGNOSIS — N2581 Secondary hyperparathyroidism of renal origin: Secondary | ICD-10-CM | POA: Diagnosis not present

## 2019-05-16 DIAGNOSIS — D631 Anemia in chronic kidney disease: Secondary | ICD-10-CM | POA: Diagnosis not present

## 2019-05-16 DIAGNOSIS — N186 End stage renal disease: Secondary | ICD-10-CM | POA: Diagnosis not present

## 2019-05-17 DIAGNOSIS — D631 Anemia in chronic kidney disease: Secondary | ICD-10-CM | POA: Diagnosis not present

## 2019-05-17 DIAGNOSIS — N2581 Secondary hyperparathyroidism of renal origin: Secondary | ICD-10-CM | POA: Diagnosis not present

## 2019-05-17 DIAGNOSIS — N186 End stage renal disease: Secondary | ICD-10-CM | POA: Diagnosis not present

## 2019-05-18 DIAGNOSIS — D631 Anemia in chronic kidney disease: Secondary | ICD-10-CM | POA: Diagnosis not present

## 2019-05-18 DIAGNOSIS — N2581 Secondary hyperparathyroidism of renal origin: Secondary | ICD-10-CM | POA: Diagnosis not present

## 2019-05-18 DIAGNOSIS — N186 End stage renal disease: Secondary | ICD-10-CM | POA: Diagnosis not present

## 2019-05-19 DIAGNOSIS — N2581 Secondary hyperparathyroidism of renal origin: Secondary | ICD-10-CM | POA: Diagnosis not present

## 2019-05-19 DIAGNOSIS — D631 Anemia in chronic kidney disease: Secondary | ICD-10-CM | POA: Diagnosis not present

## 2019-05-19 DIAGNOSIS — N186 End stage renal disease: Secondary | ICD-10-CM | POA: Diagnosis not present

## 2019-05-20 DIAGNOSIS — N186 End stage renal disease: Secondary | ICD-10-CM | POA: Diagnosis not present

## 2019-05-20 DIAGNOSIS — D631 Anemia in chronic kidney disease: Secondary | ICD-10-CM | POA: Diagnosis not present

## 2019-05-20 DIAGNOSIS — N2581 Secondary hyperparathyroidism of renal origin: Secondary | ICD-10-CM | POA: Diagnosis not present

## 2019-05-21 DIAGNOSIS — D631 Anemia in chronic kidney disease: Secondary | ICD-10-CM | POA: Diagnosis not present

## 2019-05-21 DIAGNOSIS — N2581 Secondary hyperparathyroidism of renal origin: Secondary | ICD-10-CM | POA: Diagnosis not present

## 2019-05-21 DIAGNOSIS — N186 End stage renal disease: Secondary | ICD-10-CM | POA: Diagnosis not present

## 2019-05-22 DIAGNOSIS — D631 Anemia in chronic kidney disease: Secondary | ICD-10-CM | POA: Diagnosis not present

## 2019-05-22 DIAGNOSIS — N186 End stage renal disease: Secondary | ICD-10-CM | POA: Diagnosis not present

## 2019-05-22 DIAGNOSIS — N2581 Secondary hyperparathyroidism of renal origin: Secondary | ICD-10-CM | POA: Diagnosis not present

## 2019-05-23 DIAGNOSIS — Z992 Dependence on renal dialysis: Secondary | ICD-10-CM | POA: Diagnosis not present

## 2019-05-23 DIAGNOSIS — N186 End stage renal disease: Secondary | ICD-10-CM | POA: Diagnosis not present

## 2019-05-23 DIAGNOSIS — D631 Anemia in chronic kidney disease: Secondary | ICD-10-CM | POA: Diagnosis not present

## 2019-05-23 DIAGNOSIS — N2581 Secondary hyperparathyroidism of renal origin: Secondary | ICD-10-CM | POA: Diagnosis not present

## 2019-05-24 DIAGNOSIS — N2581 Secondary hyperparathyroidism of renal origin: Secondary | ICD-10-CM | POA: Diagnosis not present

## 2019-05-24 DIAGNOSIS — D631 Anemia in chronic kidney disease: Secondary | ICD-10-CM | POA: Diagnosis not present

## 2019-05-24 DIAGNOSIS — N186 End stage renal disease: Secondary | ICD-10-CM | POA: Diagnosis not present

## 2019-05-25 DIAGNOSIS — D631 Anemia in chronic kidney disease: Secondary | ICD-10-CM | POA: Diagnosis not present

## 2019-05-25 DIAGNOSIS — N186 End stage renal disease: Secondary | ICD-10-CM | POA: Diagnosis not present

## 2019-05-25 DIAGNOSIS — N2581 Secondary hyperparathyroidism of renal origin: Secondary | ICD-10-CM | POA: Diagnosis not present

## 2019-05-26 DIAGNOSIS — N2581 Secondary hyperparathyroidism of renal origin: Secondary | ICD-10-CM | POA: Diagnosis not present

## 2019-05-26 DIAGNOSIS — N186 End stage renal disease: Secondary | ICD-10-CM | POA: Diagnosis not present

## 2019-05-26 DIAGNOSIS — D631 Anemia in chronic kidney disease: Secondary | ICD-10-CM | POA: Diagnosis not present

## 2019-05-27 DIAGNOSIS — N186 End stage renal disease: Secondary | ICD-10-CM | POA: Diagnosis not present

## 2019-05-27 DIAGNOSIS — D631 Anemia in chronic kidney disease: Secondary | ICD-10-CM | POA: Diagnosis not present

## 2019-05-27 DIAGNOSIS — N2581 Secondary hyperparathyroidism of renal origin: Secondary | ICD-10-CM | POA: Diagnosis not present

## 2019-05-28 DIAGNOSIS — N186 End stage renal disease: Secondary | ICD-10-CM | POA: Diagnosis not present

## 2019-05-28 DIAGNOSIS — D631 Anemia in chronic kidney disease: Secondary | ICD-10-CM | POA: Diagnosis not present

## 2019-05-28 DIAGNOSIS — N2581 Secondary hyperparathyroidism of renal origin: Secondary | ICD-10-CM | POA: Diagnosis not present

## 2019-05-29 DIAGNOSIS — D631 Anemia in chronic kidney disease: Secondary | ICD-10-CM | POA: Diagnosis not present

## 2019-05-29 DIAGNOSIS — N186 End stage renal disease: Secondary | ICD-10-CM | POA: Diagnosis not present

## 2019-05-29 DIAGNOSIS — N2581 Secondary hyperparathyroidism of renal origin: Secondary | ICD-10-CM | POA: Diagnosis not present

## 2019-05-30 DIAGNOSIS — N186 End stage renal disease: Secondary | ICD-10-CM | POA: Diagnosis not present

## 2019-05-30 DIAGNOSIS — D631 Anemia in chronic kidney disease: Secondary | ICD-10-CM | POA: Diagnosis not present

## 2019-05-30 DIAGNOSIS — N2581 Secondary hyperparathyroidism of renal origin: Secondary | ICD-10-CM | POA: Diagnosis not present

## 2019-05-31 DIAGNOSIS — N186 End stage renal disease: Secondary | ICD-10-CM | POA: Diagnosis not present

## 2019-05-31 DIAGNOSIS — D631 Anemia in chronic kidney disease: Secondary | ICD-10-CM | POA: Diagnosis not present

## 2019-05-31 DIAGNOSIS — N2581 Secondary hyperparathyroidism of renal origin: Secondary | ICD-10-CM | POA: Diagnosis not present

## 2019-06-01 DIAGNOSIS — D631 Anemia in chronic kidney disease: Secondary | ICD-10-CM | POA: Diagnosis not present

## 2019-06-01 DIAGNOSIS — N186 End stage renal disease: Secondary | ICD-10-CM | POA: Diagnosis not present

## 2019-06-01 DIAGNOSIS — N2581 Secondary hyperparathyroidism of renal origin: Secondary | ICD-10-CM | POA: Diagnosis not present

## 2019-06-02 DIAGNOSIS — D631 Anemia in chronic kidney disease: Secondary | ICD-10-CM | POA: Diagnosis not present

## 2019-06-02 DIAGNOSIS — N186 End stage renal disease: Secondary | ICD-10-CM | POA: Diagnosis not present

## 2019-06-02 DIAGNOSIS — N2581 Secondary hyperparathyroidism of renal origin: Secondary | ICD-10-CM | POA: Diagnosis not present

## 2019-06-03 DIAGNOSIS — N2581 Secondary hyperparathyroidism of renal origin: Secondary | ICD-10-CM | POA: Diagnosis not present

## 2019-06-03 DIAGNOSIS — N186 End stage renal disease: Secondary | ICD-10-CM | POA: Diagnosis not present

## 2019-06-03 DIAGNOSIS — D631 Anemia in chronic kidney disease: Secondary | ICD-10-CM | POA: Diagnosis not present

## 2019-06-04 ENCOUNTER — Other Ambulatory Visit: Payer: Self-pay

## 2019-06-04 DIAGNOSIS — Z20822 Contact with and (suspected) exposure to covid-19: Secondary | ICD-10-CM

## 2019-06-04 DIAGNOSIS — D631 Anemia in chronic kidney disease: Secondary | ICD-10-CM | POA: Diagnosis not present

## 2019-06-04 DIAGNOSIS — N186 End stage renal disease: Secondary | ICD-10-CM | POA: Diagnosis not present

## 2019-06-04 DIAGNOSIS — N2581 Secondary hyperparathyroidism of renal origin: Secondary | ICD-10-CM | POA: Diagnosis not present

## 2019-06-05 DIAGNOSIS — N186 End stage renal disease: Secondary | ICD-10-CM | POA: Diagnosis not present

## 2019-06-05 DIAGNOSIS — D631 Anemia in chronic kidney disease: Secondary | ICD-10-CM | POA: Diagnosis not present

## 2019-06-05 DIAGNOSIS — N2581 Secondary hyperparathyroidism of renal origin: Secondary | ICD-10-CM | POA: Diagnosis not present

## 2019-06-05 LAB — NOVEL CORONAVIRUS, NAA: SARS-CoV-2, NAA: NOT DETECTED

## 2019-06-06 DIAGNOSIS — D631 Anemia in chronic kidney disease: Secondary | ICD-10-CM | POA: Diagnosis not present

## 2019-06-06 DIAGNOSIS — N186 End stage renal disease: Secondary | ICD-10-CM | POA: Diagnosis not present

## 2019-06-06 DIAGNOSIS — N2581 Secondary hyperparathyroidism of renal origin: Secondary | ICD-10-CM | POA: Diagnosis not present

## 2019-06-07 DIAGNOSIS — N186 End stage renal disease: Secondary | ICD-10-CM | POA: Diagnosis not present

## 2019-06-07 DIAGNOSIS — D631 Anemia in chronic kidney disease: Secondary | ICD-10-CM | POA: Diagnosis not present

## 2019-06-07 DIAGNOSIS — N2581 Secondary hyperparathyroidism of renal origin: Secondary | ICD-10-CM | POA: Diagnosis not present

## 2019-06-08 DIAGNOSIS — N2581 Secondary hyperparathyroidism of renal origin: Secondary | ICD-10-CM | POA: Diagnosis not present

## 2019-06-08 DIAGNOSIS — N186 End stage renal disease: Secondary | ICD-10-CM | POA: Diagnosis not present

## 2019-06-08 DIAGNOSIS — D631 Anemia in chronic kidney disease: Secondary | ICD-10-CM | POA: Diagnosis not present

## 2019-06-09 DIAGNOSIS — D631 Anemia in chronic kidney disease: Secondary | ICD-10-CM | POA: Diagnosis not present

## 2019-06-09 DIAGNOSIS — N2581 Secondary hyperparathyroidism of renal origin: Secondary | ICD-10-CM | POA: Diagnosis not present

## 2019-06-09 DIAGNOSIS — N186 End stage renal disease: Secondary | ICD-10-CM | POA: Diagnosis not present

## 2019-06-09 NOTE — Progress Notes (Deleted)
Jane [name] OFFICE PROGRESS NOTE  Patient Care Team: Jane Mccreedy, MD as PCP - General (Internal Medicine) Jane Bare, MD as Consulting Physician (Nephrology)  HEME/ONC OVERVIEW: 1. Macrocytic anemia -Secondary to ESRD; Hgb mid-7 to 8's since 2018 -Iron profile c/w ACD; B12, folate normal; previous SPEP negative by nephrology -On Aranesp, managed by nephrology   TREATMENT REGIMEN:  Aranesp (nephrology)  PERTINENT NON-HEM/ONC PROBLEMS: 1. ESRD on peritoneal dialysis since 09/2014 -Followed by Dr. Jolinda Croak of nephrology at [place] between q2weeks and monthly  2. Mental retardation   ASSESSMENT & PLAN:   Macrocytic anemia -Most likely secondary to ESRD  -Currently on Aranesp with dialysis -Hgb ___ today; WBC and plt count normal -Given the longstanding stable anemia without any concurrent leukopenia or thrombocytopenia, MDS is less likely -Review of the MM panel from 2013 was negative for any monoclonal protein  -As the patient is receiving Aranesp with nephrology, we will defer the management to nephorology at [facility]  -If she develops progressive leukopenia or thrombocytopenia, or becomes RBC transfusion dependent despite Aranesp injection, then we may consider bone marrow biopsy to rule out any underlying bone marrow disorder  ESRD -On peritoneal dialysis since 09/2014 -Patient is followed by Dr. Jolinda Croak at [facility], and has been receiving Aranesp injection -She is currently being evaluated for renal transplant  -Continue PD per nephrology  No orders of the defined types were placed in this encounter.   All questions were answered. The patient knows to call the clinic with any problems, questions or concerns. No barriers to learning was detected.  A total of more than {CHL ONC TIME VISIT - [id number]} were spent face-to-face with the patient during this encounter and over half of that time was spent on counseling  and coordination of care as outlined above.   [name], MD 06/09/2019 12:36 PM  CHIEF COMPLAINT: "I am here for *** "  INTERVAL HISTORY:   SUMMARY OF ONCOLOGIC HISTORY: Oncology History   No history exists.    REVIEW OF SYSTEMS:   Constitutional: ( - ) fevers, ( - )  chills , ( - ) night sweats Eyes: ( - ) blurriness of vision, ( - ) double vision, ( - ) watery eyes Ears, nose, mouth, throat, and face: ( - ) mucositis, ( - ) sore throat Respiratory: ( - ) cough, ( - ) dyspnea, ( - ) wheezes Cardiovascular: ( - ) palpitation, ( - ) chest discomfort, ( - ) lower extremity swelling Gastrointestinal:  ( - ) nausea, ( - ) heartburn, ( - ) change in bowel habits Skin: ( - ) abnormal skin rashes Lymphatics: ( - ) new lymphadenopathy, ( - ) easy bruising Neurological: ( - ) numbness, ( - ) tingling, ( - ) new weaknesses Behavioral/Psych: ( - ) mood change, ( - ) new changes  All other systems were reviewed with the patient and are negative.  I have reviewed the past medical history, past surgical history, social history and family history with the patient and they are unchanged from previous note.  ALLERGIES:  has No Known Allergies.  MEDICATIONS:  Current Outpatient Medications  Medication Sig Dispense Refill  . albuterol (PROVENTIL HFA;VENTOLIN HFA) 108 (90 Base) MCG/ACT inhaler Inhale 2 puffs into the lungs every 6 (six) hours as needed for wheezing or shortness of breath.    . B Complex-C-Folic Acid (DIALYVITE 010) 0.8 MG TABS Take 0.8 mg by mouth daily.     Marland Kitchen  benztropine (COGENTIN) 1 MG tablet Take 1 mg by mouth at bedtime.    . cinacalcet (SENSIPAR) 30 MG tablet Take 30-60 mg by mouth See admin instructions. Take 1 tablet (30 mg) by mouth with breakfast and 2 tablets (60 mg) with supper    . divalproex (DEPAKOTE) 500 MG DR tablet Take 500 mg by mouth 2 (two) times daily.    . iron polysaccharides (NIFEREX) 150 MG capsule Take 150 mg by mouth every other day.    . levETIRAcetam  (KEPPRA) 500 MG tablet Take 1 tablet (500 mg total) by mouth 2 (two) times daily. 60 tablet 3  . levothyroxine (SYNTHROID, LEVOTHROID) 50 MCG tablet Take 50 mcg by mouth daily with breakfast.  0  . medroxyPROGESTERone (DEPO-PROVERA) 150 MG/ML injection Inject 150 mg into the muscle every 3 (three) months.     . potassium chloride SA (K-DUR,KLOR-CON) 20 MEQ tablet Take 20 mEq by mouth daily with breakfast.     . risperiDONE (RISPERDAL) 2 MG tablet Take 2 mg by mouth at bedtime.    . sevelamer carbonate (RENVELA) 800 MG tablet Take 800 mg by mouth See admin instructions. Take 1 tablet (800 mg) by mouth with meals and snacks    . vitamin B-12 (CYANOCOBALAMIN) 500 MCG tablet Take 500 mcg by mouth daily.     No current facility-administered medications for this visit.     PHYSICAL EXAMINATION: ECOG PERFORMANCE STATUS: {CHL ONC ECOG [phone]}  There were no vitals filed for this visit. There is no height or weight on file to calculate BMI.  There were no vitals filed for this visit.  GENERAL: alert, no distress and comfortable SKIN: skin color, texture, turgor are normal, no rashes or significant lesions EYES: conjunctiva are pink and non-injected, sclera clear OROPHARYNX: no exudate, no erythema; lips, buccal mucosa, and tongue normal  NECK: supple, non-tender LYMPH:  no palpable lymphadenopathy in the cervical LUNGS: clear to auscultation with normal breathing effort HEART: regular rate & rhythm and no murmurs and no lower extremity edema ABDOMEN: soft, non-tender, non-distended, normal bowel sounds Musculoskeletal: no cyanosis of digits and no clubbing  PSYCH: alert & oriented x 3, fluent speech NEURO: no focal motor/sensory deficits  LABORATORY DATA:  I have reviewed the data as listed    Component Value Date/Time   NA 136 12/09/2018 0835   K 4.0 12/09/2018 0835   CL 93 (L) 12/09/2018 0835   CO2 28 12/09/2018 0835   GLUCOSE 77 12/09/2018 0835   BUN 37 (H) 12/09/2018 0835    CREATININE 10.57 (HH) 12/09/2018 0835   CALCIUM 9.3 12/09/2018 0835   PROT 7.2 12/09/2018 0835   ALBUMIN 3.5 12/09/2018 0835   AST 9 (L) 12/09/2018 0835   ALT <5 12/09/2018 0835   ALKPHOS 204 (H) 12/09/2018 0835   BILITOT 0.3 12/09/2018 0835   GFRNONAA 4 (L) 12/09/2018 0835   GFRAA 4 (L) 12/09/2018 0835    No results found for: SPEP, UPEP  Lab Results  Component Value Date   WBC 6.1 12/09/2018   NEUTROABS 4.1 12/09/2018   HGB 7.5 (L) 12/09/2018   HCT 24.4 (L) 12/09/2018   MCV 103.4 (H) 12/09/2018   PLT 320 12/09/2018      Chemistry      Component Value Date/Time   NA 136 12/09/2018 0835   K 4.0 12/09/2018 0835   CL 93 (L) 12/09/2018 0835   CO2 28 12/09/2018 0835   BUN 37 (H) 12/09/2018 0835   CREATININE 10.57 ([place]) 12/09/2018 0350  Component Value Date/Time   CALCIUM 9.3 12/09/2018 0835   ALKPHOS 204 (H) 12/09/2018 0835   AST 9 (L) 12/09/2018 0835   ALT <5 12/09/2018 0835   BILITOT 0.3 12/09/2018 0835       RADIOGRAPHIC STUDIES: I have personally reviewed the radiological images as listed below and agreed with the findings in the report. No results found.

## 2019-06-10 ENCOUNTER — Inpatient Hospital Stay: Payer: Medicare Other | Attending: Hematology

## 2019-06-10 ENCOUNTER — Ambulatory Visit: Payer: Medicare Other | Admitting: Hematology

## 2019-06-10 DIAGNOSIS — D631 Anemia in chronic kidney disease: Secondary | ICD-10-CM | POA: Diagnosis not present

## 2019-06-10 DIAGNOSIS — N186 End stage renal disease: Secondary | ICD-10-CM | POA: Diagnosis not present

## 2019-06-10 DIAGNOSIS — N2581 Secondary hyperparathyroidism of renal origin: Secondary | ICD-10-CM | POA: Diagnosis not present

## 2019-06-11 DIAGNOSIS — D631 Anemia in chronic kidney disease: Secondary | ICD-10-CM | POA: Diagnosis not present

## 2019-06-11 DIAGNOSIS — N186 End stage renal disease: Secondary | ICD-10-CM | POA: Diagnosis not present

## 2019-06-11 DIAGNOSIS — N2581 Secondary hyperparathyroidism of renal origin: Secondary | ICD-10-CM | POA: Diagnosis not present

## 2019-06-12 DIAGNOSIS — N2581 Secondary hyperparathyroidism of renal origin: Secondary | ICD-10-CM | POA: Diagnosis not present

## 2019-06-12 DIAGNOSIS — N186 End stage renal disease: Secondary | ICD-10-CM | POA: Diagnosis not present

## 2019-06-12 DIAGNOSIS — D631 Anemia in chronic kidney disease: Secondary | ICD-10-CM | POA: Diagnosis not present

## 2019-06-13 DIAGNOSIS — N2581 Secondary hyperparathyroidism of renal origin: Secondary | ICD-10-CM | POA: Diagnosis not present

## 2019-06-13 DIAGNOSIS — D631 Anemia in chronic kidney disease: Secondary | ICD-10-CM | POA: Diagnosis not present

## 2019-06-13 DIAGNOSIS — N186 End stage renal disease: Secondary | ICD-10-CM | POA: Diagnosis not present

## 2019-06-14 DIAGNOSIS — N186 End stage renal disease: Secondary | ICD-10-CM | POA: Diagnosis not present

## 2019-06-14 DIAGNOSIS — D631 Anemia in chronic kidney disease: Secondary | ICD-10-CM | POA: Diagnosis not present

## 2019-06-14 DIAGNOSIS — N2581 Secondary hyperparathyroidism of renal origin: Secondary | ICD-10-CM | POA: Diagnosis not present

## 2019-06-15 DIAGNOSIS — D631 Anemia in chronic kidney disease: Secondary | ICD-10-CM | POA: Diagnosis not present

## 2019-06-15 DIAGNOSIS — N2581 Secondary hyperparathyroidism of renal origin: Secondary | ICD-10-CM | POA: Diagnosis not present

## 2019-06-15 DIAGNOSIS — N186 End stage renal disease: Secondary | ICD-10-CM | POA: Diagnosis not present

## 2019-06-16 DIAGNOSIS — N2581 Secondary hyperparathyroidism of renal origin: Secondary | ICD-10-CM | POA: Diagnosis not present

## 2019-06-16 DIAGNOSIS — D631 Anemia in chronic kidney disease: Secondary | ICD-10-CM | POA: Diagnosis not present

## 2019-06-16 DIAGNOSIS — N186 End stage renal disease: Secondary | ICD-10-CM | POA: Diagnosis not present

## 2019-06-17 DIAGNOSIS — N2581 Secondary hyperparathyroidism of renal origin: Secondary | ICD-10-CM | POA: Diagnosis not present

## 2019-06-17 DIAGNOSIS — N186 End stage renal disease: Secondary | ICD-10-CM | POA: Diagnosis not present

## 2019-06-17 DIAGNOSIS — D631 Anemia in chronic kidney disease: Secondary | ICD-10-CM | POA: Diagnosis not present

## 2019-06-18 DIAGNOSIS — N186 End stage renal disease: Secondary | ICD-10-CM | POA: Diagnosis not present

## 2019-06-18 DIAGNOSIS — D631 Anemia in chronic kidney disease: Secondary | ICD-10-CM | POA: Diagnosis not present

## 2019-06-18 DIAGNOSIS — N2581 Secondary hyperparathyroidism of renal origin: Secondary | ICD-10-CM | POA: Diagnosis not present

## 2019-06-19 DIAGNOSIS — D631 Anemia in chronic kidney disease: Secondary | ICD-10-CM | POA: Diagnosis not present

## 2019-06-19 DIAGNOSIS — N2581 Secondary hyperparathyroidism of renal origin: Secondary | ICD-10-CM | POA: Diagnosis not present

## 2019-06-19 DIAGNOSIS — N186 End stage renal disease: Secondary | ICD-10-CM | POA: Diagnosis not present

## 2019-06-20 DIAGNOSIS — D631 Anemia in chronic kidney disease: Secondary | ICD-10-CM | POA: Diagnosis not present

## 2019-06-20 DIAGNOSIS — N186 End stage renal disease: Secondary | ICD-10-CM | POA: Diagnosis not present

## 2019-06-20 DIAGNOSIS — N2581 Secondary hyperparathyroidism of renal origin: Secondary | ICD-10-CM | POA: Diagnosis not present

## 2019-06-21 DIAGNOSIS — N186 End stage renal disease: Secondary | ICD-10-CM | POA: Diagnosis not present

## 2019-06-21 DIAGNOSIS — N2581 Secondary hyperparathyroidism of renal origin: Secondary | ICD-10-CM | POA: Diagnosis not present

## 2019-06-21 DIAGNOSIS — D631 Anemia in chronic kidney disease: Secondary | ICD-10-CM | POA: Diagnosis not present

## 2019-06-22 DIAGNOSIS — N186 End stage renal disease: Secondary | ICD-10-CM | POA: Diagnosis not present

## 2019-06-22 DIAGNOSIS — D631 Anemia in chronic kidney disease: Secondary | ICD-10-CM | POA: Diagnosis not present

## 2019-06-22 DIAGNOSIS — N2581 Secondary hyperparathyroidism of renal origin: Secondary | ICD-10-CM | POA: Diagnosis not present

## 2019-06-23 DIAGNOSIS — N2581 Secondary hyperparathyroidism of renal origin: Secondary | ICD-10-CM | POA: Diagnosis not present

## 2019-06-23 DIAGNOSIS — N186 End stage renal disease: Secondary | ICD-10-CM | POA: Diagnosis not present

## 2019-06-23 DIAGNOSIS — D631 Anemia in chronic kidney disease: Secondary | ICD-10-CM | POA: Diagnosis not present

## 2019-06-23 DIAGNOSIS — Z992 Dependence on renal dialysis: Secondary | ICD-10-CM | POA: Diagnosis not present

## 2019-06-24 DIAGNOSIS — D631 Anemia in chronic kidney disease: Secondary | ICD-10-CM | POA: Diagnosis not present

## 2019-06-24 DIAGNOSIS — N2581 Secondary hyperparathyroidism of renal origin: Secondary | ICD-10-CM | POA: Diagnosis not present

## 2019-06-24 DIAGNOSIS — N186 End stage renal disease: Secondary | ICD-10-CM | POA: Diagnosis not present

## 2019-06-25 DIAGNOSIS — F250 Schizoaffective disorder, bipolar type: Secondary | ICD-10-CM | POA: Diagnosis not present

## 2019-06-25 DIAGNOSIS — D631 Anemia in chronic kidney disease: Secondary | ICD-10-CM | POA: Diagnosis not present

## 2019-06-25 DIAGNOSIS — N2581 Secondary hyperparathyroidism of renal origin: Secondary | ICD-10-CM | POA: Diagnosis not present

## 2019-06-25 DIAGNOSIS — F29 Unspecified psychosis not due to a substance or known physiological condition: Secondary | ICD-10-CM | POA: Diagnosis not present

## 2019-06-25 DIAGNOSIS — F71 Moderate intellectual disabilities: Secondary | ICD-10-CM | POA: Diagnosis not present

## 2019-06-25 DIAGNOSIS — N186 End stage renal disease: Secondary | ICD-10-CM | POA: Diagnosis not present

## 2019-06-25 DIAGNOSIS — F319 Bipolar disorder, unspecified: Secondary | ICD-10-CM | POA: Diagnosis not present

## 2019-06-26 DIAGNOSIS — N186 End stage renal disease: Secondary | ICD-10-CM | POA: Diagnosis not present

## 2019-06-26 DIAGNOSIS — N2581 Secondary hyperparathyroidism of renal origin: Secondary | ICD-10-CM | POA: Diagnosis not present

## 2019-06-26 DIAGNOSIS — D631 Anemia in chronic kidney disease: Secondary | ICD-10-CM | POA: Diagnosis not present

## 2019-06-27 DIAGNOSIS — N186 End stage renal disease: Secondary | ICD-10-CM | POA: Diagnosis not present

## 2019-06-27 DIAGNOSIS — N2581 Secondary hyperparathyroidism of renal origin: Secondary | ICD-10-CM | POA: Diagnosis not present

## 2019-06-27 DIAGNOSIS — D631 Anemia in chronic kidney disease: Secondary | ICD-10-CM | POA: Diagnosis not present

## 2019-06-28 DIAGNOSIS — N186 End stage renal disease: Secondary | ICD-10-CM | POA: Diagnosis not present

## 2019-06-28 DIAGNOSIS — N2581 Secondary hyperparathyroidism of renal origin: Secondary | ICD-10-CM | POA: Diagnosis not present

## 2019-06-28 DIAGNOSIS — D631 Anemia in chronic kidney disease: Secondary | ICD-10-CM | POA: Diagnosis not present

## 2019-06-29 DIAGNOSIS — D631 Anemia in chronic kidney disease: Secondary | ICD-10-CM | POA: Diagnosis not present

## 2019-06-29 DIAGNOSIS — N2581 Secondary hyperparathyroidism of renal origin: Secondary | ICD-10-CM | POA: Diagnosis not present

## 2019-06-29 DIAGNOSIS — N186 End stage renal disease: Secondary | ICD-10-CM | POA: Diagnosis not present

## 2019-06-30 DIAGNOSIS — N2581 Secondary hyperparathyroidism of renal origin: Secondary | ICD-10-CM | POA: Diagnosis not present

## 2019-06-30 DIAGNOSIS — N186 End stage renal disease: Secondary | ICD-10-CM | POA: Diagnosis not present

## 2019-06-30 DIAGNOSIS — D631 Anemia in chronic kidney disease: Secondary | ICD-10-CM | POA: Diagnosis not present

## 2019-07-01 DIAGNOSIS — N2581 Secondary hyperparathyroidism of renal origin: Secondary | ICD-10-CM | POA: Diagnosis not present

## 2019-07-01 DIAGNOSIS — N186 End stage renal disease: Secondary | ICD-10-CM | POA: Diagnosis not present

## 2019-07-01 DIAGNOSIS — D631 Anemia in chronic kidney disease: Secondary | ICD-10-CM | POA: Diagnosis not present

## 2019-07-02 DIAGNOSIS — D631 Anemia in chronic kidney disease: Secondary | ICD-10-CM | POA: Diagnosis not present

## 2019-07-02 DIAGNOSIS — N2581 Secondary hyperparathyroidism of renal origin: Secondary | ICD-10-CM | POA: Diagnosis not present

## 2019-07-02 DIAGNOSIS — N186 End stage renal disease: Secondary | ICD-10-CM | POA: Diagnosis not present

## 2019-07-03 DIAGNOSIS — N186 End stage renal disease: Secondary | ICD-10-CM | POA: Diagnosis not present

## 2019-07-03 DIAGNOSIS — D631 Anemia in chronic kidney disease: Secondary | ICD-10-CM | POA: Diagnosis not present

## 2019-07-03 DIAGNOSIS — N2581 Secondary hyperparathyroidism of renal origin: Secondary | ICD-10-CM | POA: Diagnosis not present

## 2019-07-04 DIAGNOSIS — N2581 Secondary hyperparathyroidism of renal origin: Secondary | ICD-10-CM | POA: Diagnosis not present

## 2019-07-04 DIAGNOSIS — D631 Anemia in chronic kidney disease: Secondary | ICD-10-CM | POA: Diagnosis not present

## 2019-07-04 DIAGNOSIS — N186 End stage renal disease: Secondary | ICD-10-CM | POA: Diagnosis not present

## 2019-07-05 DIAGNOSIS — D631 Anemia in chronic kidney disease: Secondary | ICD-10-CM | POA: Diagnosis not present

## 2019-07-05 DIAGNOSIS — N2581 Secondary hyperparathyroidism of renal origin: Secondary | ICD-10-CM | POA: Diagnosis not present

## 2019-07-05 DIAGNOSIS — N186 End stage renal disease: Secondary | ICD-10-CM | POA: Diagnosis not present

## 2019-07-06 DIAGNOSIS — N2581 Secondary hyperparathyroidism of renal origin: Secondary | ICD-10-CM | POA: Diagnosis not present

## 2019-07-06 DIAGNOSIS — N186 End stage renal disease: Secondary | ICD-10-CM | POA: Diagnosis not present

## 2019-07-06 DIAGNOSIS — D631 Anemia in chronic kidney disease: Secondary | ICD-10-CM | POA: Diagnosis not present

## 2019-07-07 DIAGNOSIS — E039 Hypothyroidism, unspecified: Secondary | ICD-10-CM | POA: Diagnosis not present

## 2019-07-07 DIAGNOSIS — D631 Anemia in chronic kidney disease: Secondary | ICD-10-CM | POA: Diagnosis not present

## 2019-07-07 DIAGNOSIS — N2581 Secondary hyperparathyroidism of renal origin: Secondary | ICD-10-CM | POA: Diagnosis not present

## 2019-07-07 DIAGNOSIS — D539 Nutritional anemia, unspecified: Secondary | ICD-10-CM | POA: Diagnosis not present

## 2019-07-07 DIAGNOSIS — E782 Mixed hyperlipidemia: Secondary | ICD-10-CM | POA: Diagnosis not present

## 2019-07-07 DIAGNOSIS — R569 Unspecified convulsions: Secondary | ICD-10-CM | POA: Diagnosis not present

## 2019-07-07 DIAGNOSIS — Z3042 Encounter for surveillance of injectable contraceptive: Secondary | ICD-10-CM | POA: Diagnosis not present

## 2019-07-07 DIAGNOSIS — N186 End stage renal disease: Secondary | ICD-10-CM | POA: Diagnosis not present

## 2019-07-07 DIAGNOSIS — N926 Irregular menstruation, unspecified: Secondary | ICD-10-CM | POA: Diagnosis not present

## 2019-07-08 DIAGNOSIS — D631 Anemia in chronic kidney disease: Secondary | ICD-10-CM | POA: Diagnosis not present

## 2019-07-08 DIAGNOSIS — N2581 Secondary hyperparathyroidism of renal origin: Secondary | ICD-10-CM | POA: Diagnosis not present

## 2019-07-08 DIAGNOSIS — N186 End stage renal disease: Secondary | ICD-10-CM | POA: Diagnosis not present

## 2019-07-09 DIAGNOSIS — D631 Anemia in chronic kidney disease: Secondary | ICD-10-CM | POA: Diagnosis not present

## 2019-07-09 DIAGNOSIS — N2581 Secondary hyperparathyroidism of renal origin: Secondary | ICD-10-CM | POA: Diagnosis not present

## 2019-07-09 DIAGNOSIS — N186 End stage renal disease: Secondary | ICD-10-CM | POA: Diagnosis not present

## 2019-07-10 DIAGNOSIS — N2581 Secondary hyperparathyroidism of renal origin: Secondary | ICD-10-CM | POA: Diagnosis not present

## 2019-07-10 DIAGNOSIS — N186 End stage renal disease: Secondary | ICD-10-CM | POA: Diagnosis not present

## 2019-07-10 DIAGNOSIS — D631 Anemia in chronic kidney disease: Secondary | ICD-10-CM | POA: Diagnosis not present

## 2019-07-11 DIAGNOSIS — N186 End stage renal disease: Secondary | ICD-10-CM | POA: Diagnosis not present

## 2019-07-11 DIAGNOSIS — N2581 Secondary hyperparathyroidism of renal origin: Secondary | ICD-10-CM | POA: Diagnosis not present

## 2019-07-11 DIAGNOSIS — D631 Anemia in chronic kidney disease: Secondary | ICD-10-CM | POA: Diagnosis not present

## 2019-07-12 DIAGNOSIS — N186 End stage renal disease: Secondary | ICD-10-CM | POA: Diagnosis not present

## 2019-07-12 DIAGNOSIS — D631 Anemia in chronic kidney disease: Secondary | ICD-10-CM | POA: Diagnosis not present

## 2019-07-12 DIAGNOSIS — N2581 Secondary hyperparathyroidism of renal origin: Secondary | ICD-10-CM | POA: Diagnosis not present

## 2019-07-13 DIAGNOSIS — N2581 Secondary hyperparathyroidism of renal origin: Secondary | ICD-10-CM | POA: Diagnosis not present

## 2019-07-13 DIAGNOSIS — D631 Anemia in chronic kidney disease: Secondary | ICD-10-CM | POA: Diagnosis not present

## 2019-07-13 DIAGNOSIS — N186 End stage renal disease: Secondary | ICD-10-CM | POA: Diagnosis not present

## 2019-07-14 DIAGNOSIS — D631 Anemia in chronic kidney disease: Secondary | ICD-10-CM | POA: Diagnosis not present

## 2019-07-14 DIAGNOSIS — N2581 Secondary hyperparathyroidism of renal origin: Secondary | ICD-10-CM | POA: Diagnosis not present

## 2019-07-14 DIAGNOSIS — N186 End stage renal disease: Secondary | ICD-10-CM | POA: Diagnosis not present

## 2019-07-15 DIAGNOSIS — D631 Anemia in chronic kidney disease: Secondary | ICD-10-CM | POA: Diagnosis not present

## 2019-07-15 DIAGNOSIS — N186 End stage renal disease: Secondary | ICD-10-CM | POA: Diagnosis not present

## 2019-07-15 DIAGNOSIS — N2581 Secondary hyperparathyroidism of renal origin: Secondary | ICD-10-CM | POA: Diagnosis not present

## 2019-07-16 DIAGNOSIS — D631 Anemia in chronic kidney disease: Secondary | ICD-10-CM | POA: Diagnosis not present

## 2019-07-16 DIAGNOSIS — N2581 Secondary hyperparathyroidism of renal origin: Secondary | ICD-10-CM | POA: Diagnosis not present

## 2019-07-16 DIAGNOSIS — N186 End stage renal disease: Secondary | ICD-10-CM | POA: Diagnosis not present

## 2019-07-17 DIAGNOSIS — N2581 Secondary hyperparathyroidism of renal origin: Secondary | ICD-10-CM | POA: Diagnosis not present

## 2019-07-17 DIAGNOSIS — N289 Disorder of kidney and ureter, unspecified: Secondary | ICD-10-CM | POA: Diagnosis not present

## 2019-07-17 DIAGNOSIS — N186 End stage renal disease: Secondary | ICD-10-CM | POA: Diagnosis not present

## 2019-07-17 DIAGNOSIS — R944 Abnormal results of kidney function studies: Secondary | ICD-10-CM | POA: Diagnosis not present

## 2019-07-17 DIAGNOSIS — D631 Anemia in chronic kidney disease: Secondary | ICD-10-CM | POA: Diagnosis not present

## 2019-07-18 DIAGNOSIS — N186 End stage renal disease: Secondary | ICD-10-CM | POA: Diagnosis not present

## 2019-07-18 DIAGNOSIS — N2581 Secondary hyperparathyroidism of renal origin: Secondary | ICD-10-CM | POA: Diagnosis not present

## 2019-07-18 DIAGNOSIS — D631 Anemia in chronic kidney disease: Secondary | ICD-10-CM | POA: Diagnosis not present

## 2019-07-19 DIAGNOSIS — N186 End stage renal disease: Secondary | ICD-10-CM | POA: Diagnosis not present

## 2019-07-19 DIAGNOSIS — N2581 Secondary hyperparathyroidism of renal origin: Secondary | ICD-10-CM | POA: Diagnosis not present

## 2019-07-19 DIAGNOSIS — D631 Anemia in chronic kidney disease: Secondary | ICD-10-CM | POA: Diagnosis not present

## 2019-07-20 DIAGNOSIS — N2581 Secondary hyperparathyroidism of renal origin: Secondary | ICD-10-CM | POA: Diagnosis not present

## 2019-07-20 DIAGNOSIS — N186 End stage renal disease: Secondary | ICD-10-CM | POA: Diagnosis not present

## 2019-07-20 DIAGNOSIS — D631 Anemia in chronic kidney disease: Secondary | ICD-10-CM | POA: Diagnosis not present

## 2019-07-21 DIAGNOSIS — N2581 Secondary hyperparathyroidism of renal origin: Secondary | ICD-10-CM | POA: Diagnosis not present

## 2019-07-21 DIAGNOSIS — N186 End stage renal disease: Secondary | ICD-10-CM | POA: Diagnosis not present

## 2019-07-21 DIAGNOSIS — D631 Anemia in chronic kidney disease: Secondary | ICD-10-CM | POA: Diagnosis not present

## 2019-07-22 DIAGNOSIS — N2581 Secondary hyperparathyroidism of renal origin: Secondary | ICD-10-CM | POA: Diagnosis not present

## 2019-07-22 DIAGNOSIS — N186 End stage renal disease: Secondary | ICD-10-CM | POA: Diagnosis not present

## 2019-07-22 DIAGNOSIS — D631 Anemia in chronic kidney disease: Secondary | ICD-10-CM | POA: Diagnosis not present

## 2019-07-23 DIAGNOSIS — N186 End stage renal disease: Secondary | ICD-10-CM | POA: Diagnosis not present

## 2019-07-23 DIAGNOSIS — N2581 Secondary hyperparathyroidism of renal origin: Secondary | ICD-10-CM | POA: Diagnosis not present

## 2019-07-23 DIAGNOSIS — Z992 Dependence on renal dialysis: Secondary | ICD-10-CM | POA: Diagnosis not present

## 2019-07-23 DIAGNOSIS — R9431 Abnormal electrocardiogram [ECG] [EKG]: Secondary | ICD-10-CM | POA: Diagnosis not present

## 2019-07-23 DIAGNOSIS — D631 Anemia in chronic kidney disease: Secondary | ICD-10-CM | POA: Diagnosis not present

## 2019-07-24 DIAGNOSIS — D631 Anemia in chronic kidney disease: Secondary | ICD-10-CM | POA: Diagnosis not present

## 2019-07-24 DIAGNOSIS — N2581 Secondary hyperparathyroidism of renal origin: Secondary | ICD-10-CM | POA: Diagnosis not present

## 2019-07-24 DIAGNOSIS — N186 End stage renal disease: Secondary | ICD-10-CM | POA: Diagnosis not present

## 2019-07-25 DIAGNOSIS — N186 End stage renal disease: Secondary | ICD-10-CM | POA: Diagnosis not present

## 2019-07-25 DIAGNOSIS — Z4932 Encounter for adequacy testing for peritoneal dialysis: Secondary | ICD-10-CM | POA: Diagnosis not present

## 2019-07-25 DIAGNOSIS — N2581 Secondary hyperparathyroidism of renal origin: Secondary | ICD-10-CM | POA: Diagnosis not present

## 2019-07-25 DIAGNOSIS — D631 Anemia in chronic kidney disease: Secondary | ICD-10-CM | POA: Diagnosis not present

## 2019-07-26 DIAGNOSIS — D631 Anemia in chronic kidney disease: Secondary | ICD-10-CM | POA: Diagnosis not present

## 2019-07-26 DIAGNOSIS — N186 End stage renal disease: Secondary | ICD-10-CM | POA: Diagnosis not present

## 2019-07-26 DIAGNOSIS — N2581 Secondary hyperparathyroidism of renal origin: Secondary | ICD-10-CM | POA: Diagnosis not present

## 2019-07-27 DIAGNOSIS — D631 Anemia in chronic kidney disease: Secondary | ICD-10-CM | POA: Diagnosis not present

## 2019-07-27 DIAGNOSIS — N186 End stage renal disease: Secondary | ICD-10-CM | POA: Diagnosis not present

## 2019-07-27 DIAGNOSIS — N2581 Secondary hyperparathyroidism of renal origin: Secondary | ICD-10-CM | POA: Diagnosis not present

## 2019-07-28 DIAGNOSIS — N2581 Secondary hyperparathyroidism of renal origin: Secondary | ICD-10-CM | POA: Diagnosis not present

## 2019-07-28 DIAGNOSIS — D631 Anemia in chronic kidney disease: Secondary | ICD-10-CM | POA: Diagnosis not present

## 2019-07-28 DIAGNOSIS — N186 End stage renal disease: Secondary | ICD-10-CM | POA: Diagnosis not present

## 2019-07-29 DIAGNOSIS — D631 Anemia in chronic kidney disease: Secondary | ICD-10-CM | POA: Diagnosis not present

## 2019-07-29 DIAGNOSIS — Z01812 Encounter for preprocedural laboratory examination: Secondary | ICD-10-CM | POA: Diagnosis not present

## 2019-07-29 DIAGNOSIS — T85611A Breakdown (mechanical) of intraperitoneal dialysis catheter, initial encounter: Secondary | ICD-10-CM | POA: Diagnosis not present

## 2019-07-29 DIAGNOSIS — N186 End stage renal disease: Secondary | ICD-10-CM | POA: Diagnosis not present

## 2019-07-29 DIAGNOSIS — Z20828 Contact with and (suspected) exposure to other viral communicable diseases: Secondary | ICD-10-CM | POA: Diagnosis not present

## 2019-07-29 DIAGNOSIS — N2581 Secondary hyperparathyroidism of renal origin: Secondary | ICD-10-CM | POA: Diagnosis not present

## 2019-07-30 DIAGNOSIS — D631 Anemia in chronic kidney disease: Secondary | ICD-10-CM | POA: Diagnosis not present

## 2019-07-30 DIAGNOSIS — N186 End stage renal disease: Secondary | ICD-10-CM | POA: Diagnosis not present

## 2019-07-30 DIAGNOSIS — N2581 Secondary hyperparathyroidism of renal origin: Secondary | ICD-10-CM | POA: Diagnosis not present

## 2019-07-30 DIAGNOSIS — T8241XD Breakdown (mechanical) of vascular dialysis catheter, subsequent encounter: Secondary | ICD-10-CM | POA: Diagnosis not present

## 2019-07-30 DIAGNOSIS — Z992 Dependence on renal dialysis: Secondary | ICD-10-CM | POA: Diagnosis not present

## 2019-07-31 DIAGNOSIS — Z4902 Encounter for fitting and adjustment of peritoneal dialysis catheter: Secondary | ICD-10-CM | POA: Diagnosis not present

## 2019-07-31 DIAGNOSIS — D631 Anemia in chronic kidney disease: Secondary | ICD-10-CM | POA: Diagnosis not present

## 2019-07-31 DIAGNOSIS — T85611A Breakdown (mechanical) of intraperitoneal dialysis catheter, initial encounter: Secondary | ICD-10-CM | POA: Diagnosis not present

## 2019-07-31 DIAGNOSIS — T85621A Displacement of intraperitoneal dialysis catheter, initial encounter: Secondary | ICD-10-CM | POA: Diagnosis not present

## 2019-07-31 DIAGNOSIS — N2581 Secondary hyperparathyroidism of renal origin: Secondary | ICD-10-CM | POA: Diagnosis not present

## 2019-07-31 DIAGNOSIS — N186 End stage renal disease: Secondary | ICD-10-CM | POA: Diagnosis not present

## 2019-08-01 DIAGNOSIS — N186 End stage renal disease: Secondary | ICD-10-CM | POA: Diagnosis not present

## 2019-08-01 DIAGNOSIS — D631 Anemia in chronic kidney disease: Secondary | ICD-10-CM | POA: Diagnosis not present

## 2019-08-01 DIAGNOSIS — Z23 Encounter for immunization: Secondary | ICD-10-CM | POA: Diagnosis not present

## 2019-08-01 DIAGNOSIS — Z418 Encounter for other procedures for purposes other than remedying health state: Secondary | ICD-10-CM | POA: Diagnosis not present

## 2019-08-01 DIAGNOSIS — D509 Iron deficiency anemia, unspecified: Secondary | ICD-10-CM | POA: Diagnosis not present

## 2019-08-04 DIAGNOSIS — Z418 Encounter for other procedures for purposes other than remedying health state: Secondary | ICD-10-CM | POA: Diagnosis not present

## 2019-08-04 DIAGNOSIS — D509 Iron deficiency anemia, unspecified: Secondary | ICD-10-CM | POA: Diagnosis not present

## 2019-08-04 DIAGNOSIS — Z23 Encounter for immunization: Secondary | ICD-10-CM | POA: Diagnosis not present

## 2019-08-04 DIAGNOSIS — D631 Anemia in chronic kidney disease: Secondary | ICD-10-CM | POA: Diagnosis not present

## 2019-08-04 DIAGNOSIS — N186 End stage renal disease: Secondary | ICD-10-CM | POA: Diagnosis not present

## 2019-08-06 DIAGNOSIS — Z23 Encounter for immunization: Secondary | ICD-10-CM | POA: Diagnosis not present

## 2019-08-06 DIAGNOSIS — N186 End stage renal disease: Secondary | ICD-10-CM | POA: Diagnosis not present

## 2019-08-06 DIAGNOSIS — Z418 Encounter for other procedures for purposes other than remedying health state: Secondary | ICD-10-CM | POA: Diagnosis not present

## 2019-08-06 DIAGNOSIS — D631 Anemia in chronic kidney disease: Secondary | ICD-10-CM | POA: Diagnosis not present

## 2019-08-06 DIAGNOSIS — D509 Iron deficiency anemia, unspecified: Secondary | ICD-10-CM | POA: Diagnosis not present

## 2019-08-07 DIAGNOSIS — D631 Anemia in chronic kidney disease: Secondary | ICD-10-CM | POA: Diagnosis not present

## 2019-08-07 DIAGNOSIS — Z418 Encounter for other procedures for purposes other than remedying health state: Secondary | ICD-10-CM | POA: Diagnosis not present

## 2019-08-07 DIAGNOSIS — N186 End stage renal disease: Secondary | ICD-10-CM | POA: Diagnosis not present

## 2019-08-07 DIAGNOSIS — Z23 Encounter for immunization: Secondary | ICD-10-CM | POA: Diagnosis not present

## 2019-08-07 DIAGNOSIS — D509 Iron deficiency anemia, unspecified: Secondary | ICD-10-CM | POA: Diagnosis not present

## 2019-08-08 DIAGNOSIS — D509 Iron deficiency anemia, unspecified: Secondary | ICD-10-CM | POA: Diagnosis not present

## 2019-08-08 DIAGNOSIS — D631 Anemia in chronic kidney disease: Secondary | ICD-10-CM | POA: Diagnosis not present

## 2019-08-08 DIAGNOSIS — Z418 Encounter for other procedures for purposes other than remedying health state: Secondary | ICD-10-CM | POA: Diagnosis not present

## 2019-08-08 DIAGNOSIS — N186 End stage renal disease: Secondary | ICD-10-CM | POA: Diagnosis not present

## 2019-08-08 DIAGNOSIS — Z23 Encounter for immunization: Secondary | ICD-10-CM | POA: Diagnosis not present

## 2019-08-11 DIAGNOSIS — D631 Anemia in chronic kidney disease: Secondary | ICD-10-CM | POA: Diagnosis not present

## 2019-08-11 DIAGNOSIS — Z418 Encounter for other procedures for purposes other than remedying health state: Secondary | ICD-10-CM | POA: Diagnosis not present

## 2019-08-11 DIAGNOSIS — Z23 Encounter for immunization: Secondary | ICD-10-CM | POA: Diagnosis not present

## 2019-08-11 DIAGNOSIS — D509 Iron deficiency anemia, unspecified: Secondary | ICD-10-CM | POA: Diagnosis not present

## 2019-08-11 DIAGNOSIS — N186 End stage renal disease: Secondary | ICD-10-CM | POA: Diagnosis not present

## 2019-08-13 DIAGNOSIS — Z418 Encounter for other procedures for purposes other than remedying health state: Secondary | ICD-10-CM | POA: Diagnosis not present

## 2019-08-13 DIAGNOSIS — D509 Iron deficiency anemia, unspecified: Secondary | ICD-10-CM | POA: Diagnosis not present

## 2019-08-13 DIAGNOSIS — N186 End stage renal disease: Secondary | ICD-10-CM | POA: Diagnosis not present

## 2019-08-13 DIAGNOSIS — D631 Anemia in chronic kidney disease: Secondary | ICD-10-CM | POA: Diagnosis not present

## 2019-08-13 DIAGNOSIS — Z23 Encounter for immunization: Secondary | ICD-10-CM | POA: Diagnosis not present

## 2019-08-15 DIAGNOSIS — Z23 Encounter for immunization: Secondary | ICD-10-CM | POA: Diagnosis not present

## 2019-08-15 DIAGNOSIS — Z418 Encounter for other procedures for purposes other than remedying health state: Secondary | ICD-10-CM | POA: Diagnosis not present

## 2019-08-15 DIAGNOSIS — N186 End stage renal disease: Secondary | ICD-10-CM | POA: Diagnosis not present

## 2019-08-15 DIAGNOSIS — D631 Anemia in chronic kidney disease: Secondary | ICD-10-CM | POA: Diagnosis not present

## 2019-08-15 DIAGNOSIS — D509 Iron deficiency anemia, unspecified: Secondary | ICD-10-CM | POA: Diagnosis not present

## 2019-08-18 DIAGNOSIS — N186 End stage renal disease: Secondary | ICD-10-CM | POA: Diagnosis not present

## 2019-08-19 DIAGNOSIS — N186 End stage renal disease: Secondary | ICD-10-CM | POA: Diagnosis not present

## 2019-08-20 DIAGNOSIS — N186 End stage renal disease: Secondary | ICD-10-CM | POA: Diagnosis not present

## 2019-08-21 DIAGNOSIS — N186 End stage renal disease: Secondary | ICD-10-CM | POA: Diagnosis not present

## 2019-08-21 DIAGNOSIS — R079 Chest pain, unspecified: Secondary | ICD-10-CM | POA: Diagnosis not present

## 2019-08-22 DIAGNOSIS — N186 End stage renal disease: Secondary | ICD-10-CM | POA: Diagnosis not present

## 2019-08-23 DIAGNOSIS — Z992 Dependence on renal dialysis: Secondary | ICD-10-CM | POA: Diagnosis not present

## 2019-08-23 DIAGNOSIS — N186 End stage renal disease: Secondary | ICD-10-CM | POA: Diagnosis not present

## 2019-08-24 DIAGNOSIS — N186 End stage renal disease: Secondary | ICD-10-CM | POA: Diagnosis not present

## 2019-08-24 DIAGNOSIS — D631 Anemia in chronic kidney disease: Secondary | ICD-10-CM | POA: Diagnosis not present

## 2019-08-24 DIAGNOSIS — N2581 Secondary hyperparathyroidism of renal origin: Secondary | ICD-10-CM | POA: Diagnosis not present

## 2019-08-25 DIAGNOSIS — D631 Anemia in chronic kidney disease: Secondary | ICD-10-CM | POA: Diagnosis not present

## 2019-08-25 DIAGNOSIS — N186 End stage renal disease: Secondary | ICD-10-CM | POA: Diagnosis not present

## 2019-08-25 DIAGNOSIS — N2581 Secondary hyperparathyroidism of renal origin: Secondary | ICD-10-CM | POA: Diagnosis not present

## 2019-08-26 DIAGNOSIS — D631 Anemia in chronic kidney disease: Secondary | ICD-10-CM | POA: Diagnosis not present

## 2019-08-26 DIAGNOSIS — N2581 Secondary hyperparathyroidism of renal origin: Secondary | ICD-10-CM | POA: Diagnosis not present

## 2019-08-26 DIAGNOSIS — N186 End stage renal disease: Secondary | ICD-10-CM | POA: Diagnosis not present

## 2019-08-27 DIAGNOSIS — N2581 Secondary hyperparathyroidism of renal origin: Secondary | ICD-10-CM | POA: Diagnosis not present

## 2019-08-27 DIAGNOSIS — D631 Anemia in chronic kidney disease: Secondary | ICD-10-CM | POA: Diagnosis not present

## 2019-08-27 DIAGNOSIS — N186 End stage renal disease: Secondary | ICD-10-CM | POA: Diagnosis not present

## 2019-08-28 DIAGNOSIS — N2581 Secondary hyperparathyroidism of renal origin: Secondary | ICD-10-CM | POA: Diagnosis not present

## 2019-08-28 DIAGNOSIS — D631 Anemia in chronic kidney disease: Secondary | ICD-10-CM | POA: Diagnosis not present

## 2019-08-28 DIAGNOSIS — N186 End stage renal disease: Secondary | ICD-10-CM | POA: Diagnosis not present

## 2019-08-29 DIAGNOSIS — N2581 Secondary hyperparathyroidism of renal origin: Secondary | ICD-10-CM | POA: Diagnosis not present

## 2019-08-29 DIAGNOSIS — D631 Anemia in chronic kidney disease: Secondary | ICD-10-CM | POA: Diagnosis not present

## 2019-08-29 DIAGNOSIS — N186 End stage renal disease: Secondary | ICD-10-CM | POA: Diagnosis not present

## 2019-08-30 DIAGNOSIS — D631 Anemia in chronic kidney disease: Secondary | ICD-10-CM | POA: Diagnosis not present

## 2019-08-30 DIAGNOSIS — N186 End stage renal disease: Secondary | ICD-10-CM | POA: Diagnosis not present

## 2019-08-30 DIAGNOSIS — N2581 Secondary hyperparathyroidism of renal origin: Secondary | ICD-10-CM | POA: Diagnosis not present

## 2019-08-31 DIAGNOSIS — N2581 Secondary hyperparathyroidism of renal origin: Secondary | ICD-10-CM | POA: Diagnosis not present

## 2019-08-31 DIAGNOSIS — N186 End stage renal disease: Secondary | ICD-10-CM | POA: Diagnosis not present

## 2019-08-31 DIAGNOSIS — D631 Anemia in chronic kidney disease: Secondary | ICD-10-CM | POA: Diagnosis not present

## 2019-09-01 DIAGNOSIS — D631 Anemia in chronic kidney disease: Secondary | ICD-10-CM | POA: Diagnosis not present

## 2019-09-01 DIAGNOSIS — T8241XD Breakdown (mechanical) of vascular dialysis catheter, subsequent encounter: Secondary | ICD-10-CM | POA: Diagnosis not present

## 2019-09-01 DIAGNOSIS — N2581 Secondary hyperparathyroidism of renal origin: Secondary | ICD-10-CM | POA: Diagnosis not present

## 2019-09-01 DIAGNOSIS — N186 End stage renal disease: Secondary | ICD-10-CM | POA: Diagnosis not present

## 2019-09-02 DIAGNOSIS — D631 Anemia in chronic kidney disease: Secondary | ICD-10-CM | POA: Diagnosis not present

## 2019-09-02 DIAGNOSIS — N186 End stage renal disease: Secondary | ICD-10-CM | POA: Diagnosis not present

## 2019-09-02 DIAGNOSIS — N2581 Secondary hyperparathyroidism of renal origin: Secondary | ICD-10-CM | POA: Diagnosis not present

## 2019-09-03 DIAGNOSIS — N186 End stage renal disease: Secondary | ICD-10-CM | POA: Diagnosis not present

## 2019-09-03 DIAGNOSIS — N2581 Secondary hyperparathyroidism of renal origin: Secondary | ICD-10-CM | POA: Diagnosis not present

## 2019-09-03 DIAGNOSIS — D631 Anemia in chronic kidney disease: Secondary | ICD-10-CM | POA: Diagnosis not present

## 2019-09-04 DIAGNOSIS — N2581 Secondary hyperparathyroidism of renal origin: Secondary | ICD-10-CM | POA: Diagnosis not present

## 2019-09-04 DIAGNOSIS — D631 Anemia in chronic kidney disease: Secondary | ICD-10-CM | POA: Diagnosis not present

## 2019-09-04 DIAGNOSIS — N186 End stage renal disease: Secondary | ICD-10-CM | POA: Diagnosis not present

## 2019-09-05 DIAGNOSIS — N2581 Secondary hyperparathyroidism of renal origin: Secondary | ICD-10-CM | POA: Diagnosis not present

## 2019-09-05 DIAGNOSIS — D631 Anemia in chronic kidney disease: Secondary | ICD-10-CM | POA: Diagnosis not present

## 2019-09-05 DIAGNOSIS — N186 End stage renal disease: Secondary | ICD-10-CM | POA: Diagnosis not present

## 2019-09-06 DIAGNOSIS — N186 End stage renal disease: Secondary | ICD-10-CM | POA: Diagnosis not present

## 2019-09-06 DIAGNOSIS — N2581 Secondary hyperparathyroidism of renal origin: Secondary | ICD-10-CM | POA: Diagnosis not present

## 2019-09-06 DIAGNOSIS — D631 Anemia in chronic kidney disease: Secondary | ICD-10-CM | POA: Diagnosis not present

## 2019-09-07 DIAGNOSIS — N186 End stage renal disease: Secondary | ICD-10-CM | POA: Diagnosis not present

## 2019-09-07 DIAGNOSIS — D631 Anemia in chronic kidney disease: Secondary | ICD-10-CM | POA: Diagnosis not present

## 2019-09-07 DIAGNOSIS — N2581 Secondary hyperparathyroidism of renal origin: Secondary | ICD-10-CM | POA: Diagnosis not present

## 2019-09-08 DIAGNOSIS — D631 Anemia in chronic kidney disease: Secondary | ICD-10-CM | POA: Diagnosis not present

## 2019-09-08 DIAGNOSIS — N186 End stage renal disease: Secondary | ICD-10-CM | POA: Diagnosis not present

## 2019-09-08 DIAGNOSIS — N2581 Secondary hyperparathyroidism of renal origin: Secondary | ICD-10-CM | POA: Diagnosis not present

## 2019-09-09 DIAGNOSIS — N2581 Secondary hyperparathyroidism of renal origin: Secondary | ICD-10-CM | POA: Diagnosis not present

## 2019-09-09 DIAGNOSIS — D631 Anemia in chronic kidney disease: Secondary | ICD-10-CM | POA: Diagnosis not present

## 2019-09-09 DIAGNOSIS — N186 End stage renal disease: Secondary | ICD-10-CM | POA: Diagnosis not present

## 2019-09-10 DIAGNOSIS — N186 End stage renal disease: Secondary | ICD-10-CM | POA: Diagnosis not present

## 2019-09-10 DIAGNOSIS — D631 Anemia in chronic kidney disease: Secondary | ICD-10-CM | POA: Diagnosis not present

## 2019-09-10 DIAGNOSIS — N2581 Secondary hyperparathyroidism of renal origin: Secondary | ICD-10-CM | POA: Diagnosis not present

## 2019-09-11 DIAGNOSIS — F29 Unspecified psychosis not due to a substance or known physiological condition: Secondary | ICD-10-CM | POA: Diagnosis not present

## 2019-09-11 DIAGNOSIS — F71 Moderate intellectual disabilities: Secondary | ICD-10-CM | POA: Diagnosis not present

## 2019-09-11 DIAGNOSIS — F319 Bipolar disorder, unspecified: Secondary | ICD-10-CM | POA: Diagnosis not present

## 2019-09-11 DIAGNOSIS — F250 Schizoaffective disorder, bipolar type: Secondary | ICD-10-CM | POA: Diagnosis not present

## 2019-09-11 DIAGNOSIS — N186 End stage renal disease: Secondary | ICD-10-CM | POA: Diagnosis not present

## 2019-09-11 DIAGNOSIS — D631 Anemia in chronic kidney disease: Secondary | ICD-10-CM | POA: Diagnosis not present

## 2019-09-11 DIAGNOSIS — N2581 Secondary hyperparathyroidism of renal origin: Secondary | ICD-10-CM | POA: Diagnosis not present

## 2019-09-12 DIAGNOSIS — N186 End stage renal disease: Secondary | ICD-10-CM | POA: Diagnosis not present

## 2019-09-12 DIAGNOSIS — N2581 Secondary hyperparathyroidism of renal origin: Secondary | ICD-10-CM | POA: Diagnosis not present

## 2019-09-12 DIAGNOSIS — D631 Anemia in chronic kidney disease: Secondary | ICD-10-CM | POA: Diagnosis not present

## 2019-09-13 DIAGNOSIS — D631 Anemia in chronic kidney disease: Secondary | ICD-10-CM | POA: Diagnosis not present

## 2019-09-13 DIAGNOSIS — N186 End stage renal disease: Secondary | ICD-10-CM | POA: Diagnosis not present

## 2019-09-13 DIAGNOSIS — N2581 Secondary hyperparathyroidism of renal origin: Secondary | ICD-10-CM | POA: Diagnosis not present

## 2019-09-14 DIAGNOSIS — N2581 Secondary hyperparathyroidism of renal origin: Secondary | ICD-10-CM | POA: Diagnosis not present

## 2019-09-14 DIAGNOSIS — D631 Anemia in chronic kidney disease: Secondary | ICD-10-CM | POA: Diagnosis not present

## 2019-09-14 DIAGNOSIS — N186 End stage renal disease: Secondary | ICD-10-CM | POA: Diagnosis not present

## 2019-09-15 DIAGNOSIS — D631 Anemia in chronic kidney disease: Secondary | ICD-10-CM | POA: Diagnosis not present

## 2019-09-15 DIAGNOSIS — N186 End stage renal disease: Secondary | ICD-10-CM | POA: Diagnosis not present

## 2019-09-15 DIAGNOSIS — N2581 Secondary hyperparathyroidism of renal origin: Secondary | ICD-10-CM | POA: Diagnosis not present

## 2019-09-16 DIAGNOSIS — D631 Anemia in chronic kidney disease: Secondary | ICD-10-CM | POA: Diagnosis not present

## 2019-09-16 DIAGNOSIS — N2581 Secondary hyperparathyroidism of renal origin: Secondary | ICD-10-CM | POA: Diagnosis not present

## 2019-09-16 DIAGNOSIS — N186 End stage renal disease: Secondary | ICD-10-CM | POA: Diagnosis not present

## 2019-09-17 DIAGNOSIS — N186 End stage renal disease: Secondary | ICD-10-CM | POA: Diagnosis not present

## 2019-09-17 DIAGNOSIS — N2581 Secondary hyperparathyroidism of renal origin: Secondary | ICD-10-CM | POA: Diagnosis not present

## 2019-09-17 DIAGNOSIS — D631 Anemia in chronic kidney disease: Secondary | ICD-10-CM | POA: Diagnosis not present

## 2019-09-18 DIAGNOSIS — N2581 Secondary hyperparathyroidism of renal origin: Secondary | ICD-10-CM | POA: Diagnosis not present

## 2019-09-18 DIAGNOSIS — N186 End stage renal disease: Secondary | ICD-10-CM | POA: Diagnosis not present

## 2019-09-18 DIAGNOSIS — D631 Anemia in chronic kidney disease: Secondary | ICD-10-CM | POA: Diagnosis not present

## 2019-09-19 DIAGNOSIS — N186 End stage renal disease: Secondary | ICD-10-CM | POA: Diagnosis not present

## 2019-09-19 DIAGNOSIS — N2581 Secondary hyperparathyroidism of renal origin: Secondary | ICD-10-CM | POA: Diagnosis not present

## 2019-09-19 DIAGNOSIS — D631 Anemia in chronic kidney disease: Secondary | ICD-10-CM | POA: Diagnosis not present

## 2019-09-20 DIAGNOSIS — N186 End stage renal disease: Secondary | ICD-10-CM | POA: Diagnosis not present

## 2019-09-20 DIAGNOSIS — D631 Anemia in chronic kidney disease: Secondary | ICD-10-CM | POA: Diagnosis not present

## 2019-09-20 DIAGNOSIS — N2581 Secondary hyperparathyroidism of renal origin: Secondary | ICD-10-CM | POA: Diagnosis not present

## 2019-09-21 DIAGNOSIS — D631 Anemia in chronic kidney disease: Secondary | ICD-10-CM | POA: Diagnosis not present

## 2019-09-21 DIAGNOSIS — N2581 Secondary hyperparathyroidism of renal origin: Secondary | ICD-10-CM | POA: Diagnosis not present

## 2019-09-21 DIAGNOSIS — N186 End stage renal disease: Secondary | ICD-10-CM | POA: Diagnosis not present

## 2019-09-22 DIAGNOSIS — N186 End stage renal disease: Secondary | ICD-10-CM | POA: Diagnosis not present

## 2019-09-22 DIAGNOSIS — N2581 Secondary hyperparathyroidism of renal origin: Secondary | ICD-10-CM | POA: Diagnosis not present

## 2019-09-22 DIAGNOSIS — D631 Anemia in chronic kidney disease: Secondary | ICD-10-CM | POA: Diagnosis not present

## 2019-10-08 DIAGNOSIS — E782 Mixed hyperlipidemia: Secondary | ICD-10-CM | POA: Diagnosis not present

## 2019-10-24 DIAGNOSIS — N2581 Secondary hyperparathyroidism of renal origin: Secondary | ICD-10-CM | POA: Diagnosis not present

## 2019-10-24 DIAGNOSIS — D631 Anemia in chronic kidney disease: Secondary | ICD-10-CM | POA: Diagnosis not present

## 2019-10-24 DIAGNOSIS — N186 End stage renal disease: Secondary | ICD-10-CM | POA: Diagnosis not present

## 2019-10-25 DIAGNOSIS — D631 Anemia in chronic kidney disease: Secondary | ICD-10-CM | POA: Diagnosis not present

## 2019-10-25 DIAGNOSIS — N2581 Secondary hyperparathyroidism of renal origin: Secondary | ICD-10-CM | POA: Diagnosis not present

## 2019-10-25 DIAGNOSIS — N186 End stage renal disease: Secondary | ICD-10-CM | POA: Diagnosis not present

## 2019-10-26 DIAGNOSIS — N2581 Secondary hyperparathyroidism of renal origin: Secondary | ICD-10-CM | POA: Diagnosis not present

## 2019-10-26 DIAGNOSIS — N186 End stage renal disease: Secondary | ICD-10-CM | POA: Diagnosis not present

## 2019-10-26 DIAGNOSIS — D631 Anemia in chronic kidney disease: Secondary | ICD-10-CM | POA: Diagnosis not present

## 2019-10-27 DIAGNOSIS — N186 End stage renal disease: Secondary | ICD-10-CM | POA: Diagnosis not present

## 2019-10-27 DIAGNOSIS — D631 Anemia in chronic kidney disease: Secondary | ICD-10-CM | POA: Diagnosis not present

## 2019-10-27 DIAGNOSIS — N2581 Secondary hyperparathyroidism of renal origin: Secondary | ICD-10-CM | POA: Diagnosis not present

## 2019-10-28 DIAGNOSIS — D631 Anemia in chronic kidney disease: Secondary | ICD-10-CM | POA: Diagnosis not present

## 2019-10-28 DIAGNOSIS — N186 End stage renal disease: Secondary | ICD-10-CM | POA: Diagnosis not present

## 2019-10-28 DIAGNOSIS — N2581 Secondary hyperparathyroidism of renal origin: Secondary | ICD-10-CM | POA: Diagnosis not present

## 2019-10-29 DIAGNOSIS — N186 End stage renal disease: Secondary | ICD-10-CM | POA: Diagnosis not present

## 2019-10-29 DIAGNOSIS — D631 Anemia in chronic kidney disease: Secondary | ICD-10-CM | POA: Diagnosis not present

## 2019-10-29 DIAGNOSIS — N2581 Secondary hyperparathyroidism of renal origin: Secondary | ICD-10-CM | POA: Diagnosis not present

## 2019-10-29 DIAGNOSIS — Z4932 Encounter for adequacy testing for peritoneal dialysis: Secondary | ICD-10-CM | POA: Diagnosis not present

## 2019-10-30 DIAGNOSIS — N2581 Secondary hyperparathyroidism of renal origin: Secondary | ICD-10-CM | POA: Diagnosis not present

## 2019-10-30 DIAGNOSIS — D631 Anemia in chronic kidney disease: Secondary | ICD-10-CM | POA: Diagnosis not present

## 2019-10-30 DIAGNOSIS — N186 End stage renal disease: Secondary | ICD-10-CM | POA: Diagnosis not present

## 2019-10-31 DIAGNOSIS — D631 Anemia in chronic kidney disease: Secondary | ICD-10-CM | POA: Diagnosis not present

## 2019-10-31 DIAGNOSIS — N2581 Secondary hyperparathyroidism of renal origin: Secondary | ICD-10-CM | POA: Diagnosis not present

## 2019-10-31 DIAGNOSIS — N186 End stage renal disease: Secondary | ICD-10-CM | POA: Diagnosis not present

## 2019-11-01 DIAGNOSIS — N186 End stage renal disease: Secondary | ICD-10-CM | POA: Diagnosis not present

## 2019-11-01 DIAGNOSIS — N2581 Secondary hyperparathyroidism of renal origin: Secondary | ICD-10-CM | POA: Diagnosis not present

## 2019-11-01 DIAGNOSIS — D631 Anemia in chronic kidney disease: Secondary | ICD-10-CM | POA: Diagnosis not present

## 2019-11-02 DIAGNOSIS — N186 End stage renal disease: Secondary | ICD-10-CM | POA: Diagnosis not present

## 2019-11-02 DIAGNOSIS — N2581 Secondary hyperparathyroidism of renal origin: Secondary | ICD-10-CM | POA: Diagnosis not present

## 2019-11-02 DIAGNOSIS — D631 Anemia in chronic kidney disease: Secondary | ICD-10-CM | POA: Diagnosis not present

## 2019-11-03 DIAGNOSIS — N186 End stage renal disease: Secondary | ICD-10-CM | POA: Diagnosis not present

## 2019-11-03 DIAGNOSIS — D631 Anemia in chronic kidney disease: Secondary | ICD-10-CM | POA: Diagnosis not present

## 2019-11-03 DIAGNOSIS — N2581 Secondary hyperparathyroidism of renal origin: Secondary | ICD-10-CM | POA: Diagnosis not present

## 2019-11-04 DIAGNOSIS — N2581 Secondary hyperparathyroidism of renal origin: Secondary | ICD-10-CM | POA: Diagnosis not present

## 2019-11-04 DIAGNOSIS — N186 End stage renal disease: Secondary | ICD-10-CM | POA: Diagnosis not present

## 2019-11-04 DIAGNOSIS — D631 Anemia in chronic kidney disease: Secondary | ICD-10-CM | POA: Diagnosis not present

## 2019-11-05 DIAGNOSIS — N2581 Secondary hyperparathyroidism of renal origin: Secondary | ICD-10-CM | POA: Diagnosis not present

## 2019-11-05 DIAGNOSIS — D631 Anemia in chronic kidney disease: Secondary | ICD-10-CM | POA: Diagnosis not present

## 2019-11-05 DIAGNOSIS — N186 End stage renal disease: Secondary | ICD-10-CM | POA: Diagnosis not present

## 2019-11-06 DIAGNOSIS — D631 Anemia in chronic kidney disease: Secondary | ICD-10-CM | POA: Diagnosis not present

## 2019-11-06 DIAGNOSIS — N186 End stage renal disease: Secondary | ICD-10-CM | POA: Diagnosis not present

## 2019-11-06 DIAGNOSIS — N2581 Secondary hyperparathyroidism of renal origin: Secondary | ICD-10-CM | POA: Diagnosis not present

## 2019-11-07 DIAGNOSIS — N186 End stage renal disease: Secondary | ICD-10-CM | POA: Diagnosis not present

## 2019-11-07 DIAGNOSIS — N2581 Secondary hyperparathyroidism of renal origin: Secondary | ICD-10-CM | POA: Diagnosis not present

## 2019-11-07 DIAGNOSIS — D631 Anemia in chronic kidney disease: Secondary | ICD-10-CM | POA: Diagnosis not present

## 2019-11-08 DIAGNOSIS — N2581 Secondary hyperparathyroidism of renal origin: Secondary | ICD-10-CM | POA: Diagnosis not present

## 2019-11-08 DIAGNOSIS — D631 Anemia in chronic kidney disease: Secondary | ICD-10-CM | POA: Diagnosis not present

## 2019-11-08 DIAGNOSIS — N186 End stage renal disease: Secondary | ICD-10-CM | POA: Diagnosis not present

## 2019-11-09 DIAGNOSIS — D631 Anemia in chronic kidney disease: Secondary | ICD-10-CM | POA: Diagnosis not present

## 2019-11-09 DIAGNOSIS — N186 End stage renal disease: Secondary | ICD-10-CM | POA: Diagnosis not present

## 2019-11-09 DIAGNOSIS — N2581 Secondary hyperparathyroidism of renal origin: Secondary | ICD-10-CM | POA: Diagnosis not present

## 2019-11-10 DIAGNOSIS — N2581 Secondary hyperparathyroidism of renal origin: Secondary | ICD-10-CM | POA: Diagnosis not present

## 2019-11-10 DIAGNOSIS — N186 End stage renal disease: Secondary | ICD-10-CM | POA: Diagnosis not present

## 2019-11-10 DIAGNOSIS — D631 Anemia in chronic kidney disease: Secondary | ICD-10-CM | POA: Diagnosis not present

## 2019-11-11 DIAGNOSIS — N186 End stage renal disease: Secondary | ICD-10-CM | POA: Diagnosis not present

## 2019-11-11 DIAGNOSIS — D631 Anemia in chronic kidney disease: Secondary | ICD-10-CM | POA: Diagnosis not present

## 2019-11-11 DIAGNOSIS — N2581 Secondary hyperparathyroidism of renal origin: Secondary | ICD-10-CM | POA: Diagnosis not present

## 2019-11-12 DIAGNOSIS — N2581 Secondary hyperparathyroidism of renal origin: Secondary | ICD-10-CM | POA: Diagnosis not present

## 2019-11-12 DIAGNOSIS — D631 Anemia in chronic kidney disease: Secondary | ICD-10-CM | POA: Diagnosis not present

## 2019-11-12 DIAGNOSIS — N186 End stage renal disease: Secondary | ICD-10-CM | POA: Diagnosis not present

## 2019-11-13 DIAGNOSIS — D631 Anemia in chronic kidney disease: Secondary | ICD-10-CM | POA: Diagnosis not present

## 2019-11-13 DIAGNOSIS — N2581 Secondary hyperparathyroidism of renal origin: Secondary | ICD-10-CM | POA: Diagnosis not present

## 2019-11-13 DIAGNOSIS — N186 End stage renal disease: Secondary | ICD-10-CM | POA: Diagnosis not present

## 2019-11-14 DIAGNOSIS — N186 End stage renal disease: Secondary | ICD-10-CM | POA: Diagnosis not present

## 2019-11-14 DIAGNOSIS — D631 Anemia in chronic kidney disease: Secondary | ICD-10-CM | POA: Diagnosis not present

## 2019-11-14 DIAGNOSIS — N2581 Secondary hyperparathyroidism of renal origin: Secondary | ICD-10-CM | POA: Diagnosis not present

## 2019-11-15 DIAGNOSIS — N2581 Secondary hyperparathyroidism of renal origin: Secondary | ICD-10-CM | POA: Diagnosis not present

## 2019-11-15 DIAGNOSIS — N186 End stage renal disease: Secondary | ICD-10-CM | POA: Diagnosis not present

## 2019-11-15 DIAGNOSIS — D631 Anemia in chronic kidney disease: Secondary | ICD-10-CM | POA: Diagnosis not present

## 2019-11-16 DIAGNOSIS — N186 End stage renal disease: Secondary | ICD-10-CM | POA: Diagnosis not present

## 2019-11-16 DIAGNOSIS — D631 Anemia in chronic kidney disease: Secondary | ICD-10-CM | POA: Diagnosis not present

## 2019-11-16 DIAGNOSIS — N2581 Secondary hyperparathyroidism of renal origin: Secondary | ICD-10-CM | POA: Diagnosis not present

## 2019-11-17 DIAGNOSIS — N186 End stage renal disease: Secondary | ICD-10-CM | POA: Diagnosis not present

## 2019-11-17 DIAGNOSIS — D631 Anemia in chronic kidney disease: Secondary | ICD-10-CM | POA: Diagnosis not present

## 2019-11-17 DIAGNOSIS — N2581 Secondary hyperparathyroidism of renal origin: Secondary | ICD-10-CM | POA: Diagnosis not present

## 2019-11-18 DIAGNOSIS — D631 Anemia in chronic kidney disease: Secondary | ICD-10-CM | POA: Diagnosis not present

## 2019-11-18 DIAGNOSIS — N2581 Secondary hyperparathyroidism of renal origin: Secondary | ICD-10-CM | POA: Diagnosis not present

## 2019-11-18 DIAGNOSIS — N186 End stage renal disease: Secondary | ICD-10-CM | POA: Diagnosis not present

## 2019-11-19 DIAGNOSIS — D631 Anemia in chronic kidney disease: Secondary | ICD-10-CM | POA: Diagnosis not present

## 2019-11-19 DIAGNOSIS — N186 End stage renal disease: Secondary | ICD-10-CM | POA: Diagnosis not present

## 2019-11-19 DIAGNOSIS — N2581 Secondary hyperparathyroidism of renal origin: Secondary | ICD-10-CM | POA: Diagnosis not present

## 2019-11-20 DIAGNOSIS — N186 End stage renal disease: Secondary | ICD-10-CM | POA: Diagnosis not present

## 2019-11-20 DIAGNOSIS — N2581 Secondary hyperparathyroidism of renal origin: Secondary | ICD-10-CM | POA: Diagnosis not present

## 2019-11-20 DIAGNOSIS — D631 Anemia in chronic kidney disease: Secondary | ICD-10-CM | POA: Diagnosis not present

## 2019-11-21 DIAGNOSIS — D631 Anemia in chronic kidney disease: Secondary | ICD-10-CM | POA: Diagnosis not present

## 2019-11-21 DIAGNOSIS — N186 End stage renal disease: Secondary | ICD-10-CM | POA: Diagnosis not present

## 2019-11-21 DIAGNOSIS — N2581 Secondary hyperparathyroidism of renal origin: Secondary | ICD-10-CM | POA: Diagnosis not present

## 2019-11-22 DIAGNOSIS — N186 End stage renal disease: Secondary | ICD-10-CM | POA: Diagnosis not present

## 2019-11-22 DIAGNOSIS — D631 Anemia in chronic kidney disease: Secondary | ICD-10-CM | POA: Diagnosis not present

## 2019-11-22 DIAGNOSIS — N2581 Secondary hyperparathyroidism of renal origin: Secondary | ICD-10-CM | POA: Diagnosis not present

## 2019-11-23 DIAGNOSIS — N2581 Secondary hyperparathyroidism of renal origin: Secondary | ICD-10-CM | POA: Diagnosis not present

## 2019-11-23 DIAGNOSIS — Z992 Dependence on renal dialysis: Secondary | ICD-10-CM | POA: Diagnosis not present

## 2019-11-23 DIAGNOSIS — N186 End stage renal disease: Secondary | ICD-10-CM | POA: Diagnosis not present

## 2019-11-23 DIAGNOSIS — D631 Anemia in chronic kidney disease: Secondary | ICD-10-CM | POA: Diagnosis not present

## 2019-11-24 ENCOUNTER — Other Ambulatory Visit: Payer: Self-pay | Admitting: Internal Medicine

## 2019-11-24 DIAGNOSIS — N186 End stage renal disease: Secondary | ICD-10-CM | POA: Diagnosis not present

## 2019-11-24 DIAGNOSIS — N2581 Secondary hyperparathyroidism of renal origin: Secondary | ICD-10-CM | POA: Diagnosis not present

## 2019-11-24 DIAGNOSIS — D631 Anemia in chronic kidney disease: Secondary | ICD-10-CM | POA: Diagnosis not present

## 2019-11-24 DIAGNOSIS — Z1231 Encounter for screening mammogram for malignant neoplasm of breast: Secondary | ICD-10-CM

## 2019-11-25 DIAGNOSIS — N186 End stage renal disease: Secondary | ICD-10-CM | POA: Diagnosis not present

## 2019-11-25 DIAGNOSIS — D631 Anemia in chronic kidney disease: Secondary | ICD-10-CM | POA: Diagnosis not present

## 2019-11-25 DIAGNOSIS — N2581 Secondary hyperparathyroidism of renal origin: Secondary | ICD-10-CM | POA: Diagnosis not present

## 2019-11-26 DIAGNOSIS — N2581 Secondary hyperparathyroidism of renal origin: Secondary | ICD-10-CM | POA: Diagnosis not present

## 2019-11-26 DIAGNOSIS — N186 End stage renal disease: Secondary | ICD-10-CM | POA: Diagnosis not present

## 2019-11-26 DIAGNOSIS — D631 Anemia in chronic kidney disease: Secondary | ICD-10-CM | POA: Diagnosis not present

## 2019-11-27 DIAGNOSIS — D631 Anemia in chronic kidney disease: Secondary | ICD-10-CM | POA: Diagnosis not present

## 2019-11-27 DIAGNOSIS — N186 End stage renal disease: Secondary | ICD-10-CM | POA: Diagnosis not present

## 2019-11-27 DIAGNOSIS — N2581 Secondary hyperparathyroidism of renal origin: Secondary | ICD-10-CM | POA: Diagnosis not present

## 2019-11-28 DIAGNOSIS — D631 Anemia in chronic kidney disease: Secondary | ICD-10-CM | POA: Diagnosis not present

## 2019-11-28 DIAGNOSIS — N2581 Secondary hyperparathyroidism of renal origin: Secondary | ICD-10-CM | POA: Diagnosis not present

## 2019-11-28 DIAGNOSIS — N186 End stage renal disease: Secondary | ICD-10-CM | POA: Diagnosis not present

## 2019-11-29 DIAGNOSIS — D631 Anemia in chronic kidney disease: Secondary | ICD-10-CM | POA: Diagnosis not present

## 2019-11-29 DIAGNOSIS — N2581 Secondary hyperparathyroidism of renal origin: Secondary | ICD-10-CM | POA: Diagnosis not present

## 2019-11-29 DIAGNOSIS — N186 End stage renal disease: Secondary | ICD-10-CM | POA: Diagnosis not present

## 2019-11-30 DIAGNOSIS — N2581 Secondary hyperparathyroidism of renal origin: Secondary | ICD-10-CM | POA: Diagnosis not present

## 2019-11-30 DIAGNOSIS — N186 End stage renal disease: Secondary | ICD-10-CM | POA: Diagnosis not present

## 2019-11-30 DIAGNOSIS — D631 Anemia in chronic kidney disease: Secondary | ICD-10-CM | POA: Diagnosis not present

## 2019-12-01 DIAGNOSIS — N186 End stage renal disease: Secondary | ICD-10-CM | POA: Diagnosis not present

## 2019-12-01 DIAGNOSIS — N2581 Secondary hyperparathyroidism of renal origin: Secondary | ICD-10-CM | POA: Diagnosis not present

## 2019-12-01 DIAGNOSIS — D631 Anemia in chronic kidney disease: Secondary | ICD-10-CM | POA: Diagnosis not present

## 2019-12-02 DIAGNOSIS — D631 Anemia in chronic kidney disease: Secondary | ICD-10-CM | POA: Diagnosis not present

## 2019-12-02 DIAGNOSIS — N2581 Secondary hyperparathyroidism of renal origin: Secondary | ICD-10-CM | POA: Diagnosis not present

## 2019-12-02 DIAGNOSIS — N186 End stage renal disease: Secondary | ICD-10-CM | POA: Diagnosis not present

## 2019-12-03 DIAGNOSIS — N186 End stage renal disease: Secondary | ICD-10-CM | POA: Diagnosis not present

## 2019-12-03 DIAGNOSIS — N2581 Secondary hyperparathyroidism of renal origin: Secondary | ICD-10-CM | POA: Diagnosis not present

## 2019-12-03 DIAGNOSIS — D631 Anemia in chronic kidney disease: Secondary | ICD-10-CM | POA: Diagnosis not present

## 2019-12-04 DIAGNOSIS — F319 Bipolar disorder, unspecified: Secondary | ICD-10-CM | POA: Diagnosis not present

## 2019-12-04 DIAGNOSIS — N2581 Secondary hyperparathyroidism of renal origin: Secondary | ICD-10-CM | POA: Diagnosis not present

## 2019-12-04 DIAGNOSIS — N186 End stage renal disease: Secondary | ICD-10-CM | POA: Diagnosis not present

## 2019-12-04 DIAGNOSIS — F250 Schizoaffective disorder, bipolar type: Secondary | ICD-10-CM | POA: Diagnosis not present

## 2019-12-04 DIAGNOSIS — F29 Unspecified psychosis not due to a substance or known physiological condition: Secondary | ICD-10-CM | POA: Diagnosis not present

## 2019-12-04 DIAGNOSIS — D631 Anemia in chronic kidney disease: Secondary | ICD-10-CM | POA: Diagnosis not present

## 2019-12-04 DIAGNOSIS — F71 Moderate intellectual disabilities: Secondary | ICD-10-CM | POA: Diagnosis not present

## 2019-12-05 DIAGNOSIS — D631 Anemia in chronic kidney disease: Secondary | ICD-10-CM | POA: Diagnosis not present

## 2019-12-05 DIAGNOSIS — N2581 Secondary hyperparathyroidism of renal origin: Secondary | ICD-10-CM | POA: Diagnosis not present

## 2019-12-05 DIAGNOSIS — N186 End stage renal disease: Secondary | ICD-10-CM | POA: Diagnosis not present

## 2019-12-06 DIAGNOSIS — N186 End stage renal disease: Secondary | ICD-10-CM | POA: Diagnosis not present

## 2019-12-06 DIAGNOSIS — D631 Anemia in chronic kidney disease: Secondary | ICD-10-CM | POA: Diagnosis not present

## 2019-12-06 DIAGNOSIS — N2581 Secondary hyperparathyroidism of renal origin: Secondary | ICD-10-CM | POA: Diagnosis not present

## 2019-12-07 DIAGNOSIS — D631 Anemia in chronic kidney disease: Secondary | ICD-10-CM | POA: Diagnosis not present

## 2019-12-07 DIAGNOSIS — N186 End stage renal disease: Secondary | ICD-10-CM | POA: Diagnosis not present

## 2019-12-07 DIAGNOSIS — N2581 Secondary hyperparathyroidism of renal origin: Secondary | ICD-10-CM | POA: Diagnosis not present

## 2019-12-08 DIAGNOSIS — N186 End stage renal disease: Secondary | ICD-10-CM | POA: Diagnosis not present

## 2019-12-08 DIAGNOSIS — D631 Anemia in chronic kidney disease: Secondary | ICD-10-CM | POA: Diagnosis not present

## 2019-12-08 DIAGNOSIS — N2581 Secondary hyperparathyroidism of renal origin: Secondary | ICD-10-CM | POA: Diagnosis not present

## 2019-12-09 DIAGNOSIS — N186 End stage renal disease: Secondary | ICD-10-CM | POA: Diagnosis not present

## 2019-12-09 DIAGNOSIS — D631 Anemia in chronic kidney disease: Secondary | ICD-10-CM | POA: Diagnosis not present

## 2019-12-09 DIAGNOSIS — N2581 Secondary hyperparathyroidism of renal origin: Secondary | ICD-10-CM | POA: Diagnosis not present

## 2019-12-10 DIAGNOSIS — D631 Anemia in chronic kidney disease: Secondary | ICD-10-CM | POA: Diagnosis not present

## 2019-12-10 DIAGNOSIS — N2581 Secondary hyperparathyroidism of renal origin: Secondary | ICD-10-CM | POA: Diagnosis not present

## 2019-12-10 DIAGNOSIS — N186 End stage renal disease: Secondary | ICD-10-CM | POA: Diagnosis not present

## 2019-12-11 DIAGNOSIS — N186 End stage renal disease: Secondary | ICD-10-CM | POA: Diagnosis not present

## 2019-12-11 DIAGNOSIS — N2581 Secondary hyperparathyroidism of renal origin: Secondary | ICD-10-CM | POA: Diagnosis not present

## 2019-12-11 DIAGNOSIS — D631 Anemia in chronic kidney disease: Secondary | ICD-10-CM | POA: Diagnosis not present

## 2019-12-12 DIAGNOSIS — N2581 Secondary hyperparathyroidism of renal origin: Secondary | ICD-10-CM | POA: Diagnosis not present

## 2019-12-12 DIAGNOSIS — D631 Anemia in chronic kidney disease: Secondary | ICD-10-CM | POA: Diagnosis not present

## 2019-12-12 DIAGNOSIS — N186 End stage renal disease: Secondary | ICD-10-CM | POA: Diagnosis not present

## 2019-12-13 DIAGNOSIS — N186 End stage renal disease: Secondary | ICD-10-CM | POA: Diagnosis not present

## 2019-12-13 DIAGNOSIS — N2581 Secondary hyperparathyroidism of renal origin: Secondary | ICD-10-CM | POA: Diagnosis not present

## 2019-12-13 DIAGNOSIS — D631 Anemia in chronic kidney disease: Secondary | ICD-10-CM | POA: Diagnosis not present

## 2019-12-14 DIAGNOSIS — N2581 Secondary hyperparathyroidism of renal origin: Secondary | ICD-10-CM | POA: Diagnosis not present

## 2019-12-14 DIAGNOSIS — D631 Anemia in chronic kidney disease: Secondary | ICD-10-CM | POA: Diagnosis not present

## 2019-12-14 DIAGNOSIS — N186 End stage renal disease: Secondary | ICD-10-CM | POA: Diagnosis not present

## 2019-12-15 DIAGNOSIS — N2581 Secondary hyperparathyroidism of renal origin: Secondary | ICD-10-CM | POA: Diagnosis not present

## 2019-12-15 DIAGNOSIS — D631 Anemia in chronic kidney disease: Secondary | ICD-10-CM | POA: Diagnosis not present

## 2019-12-15 DIAGNOSIS — N186 End stage renal disease: Secondary | ICD-10-CM | POA: Diagnosis not present

## 2019-12-16 DIAGNOSIS — N2581 Secondary hyperparathyroidism of renal origin: Secondary | ICD-10-CM | POA: Diagnosis not present

## 2019-12-16 DIAGNOSIS — N186 End stage renal disease: Secondary | ICD-10-CM | POA: Diagnosis not present

## 2019-12-16 DIAGNOSIS — D631 Anemia in chronic kidney disease: Secondary | ICD-10-CM | POA: Diagnosis not present

## 2019-12-17 DIAGNOSIS — N2581 Secondary hyperparathyroidism of renal origin: Secondary | ICD-10-CM | POA: Diagnosis not present

## 2019-12-17 DIAGNOSIS — N186 End stage renal disease: Secondary | ICD-10-CM | POA: Diagnosis not present

## 2019-12-17 DIAGNOSIS — D631 Anemia in chronic kidney disease: Secondary | ICD-10-CM | POA: Diagnosis not present

## 2019-12-18 DIAGNOSIS — D631 Anemia in chronic kidney disease: Secondary | ICD-10-CM | POA: Diagnosis not present

## 2019-12-18 DIAGNOSIS — N186 End stage renal disease: Secondary | ICD-10-CM | POA: Diagnosis not present

## 2019-12-18 DIAGNOSIS — N2581 Secondary hyperparathyroidism of renal origin: Secondary | ICD-10-CM | POA: Diagnosis not present

## 2019-12-19 DIAGNOSIS — D631 Anemia in chronic kidney disease: Secondary | ICD-10-CM | POA: Diagnosis not present

## 2019-12-19 DIAGNOSIS — N186 End stage renal disease: Secondary | ICD-10-CM | POA: Diagnosis not present

## 2019-12-19 DIAGNOSIS — N2581 Secondary hyperparathyroidism of renal origin: Secondary | ICD-10-CM | POA: Diagnosis not present

## 2019-12-20 DIAGNOSIS — N186 End stage renal disease: Secondary | ICD-10-CM | POA: Diagnosis not present

## 2019-12-20 DIAGNOSIS — N2581 Secondary hyperparathyroidism of renal origin: Secondary | ICD-10-CM | POA: Diagnosis not present

## 2019-12-20 DIAGNOSIS — D631 Anemia in chronic kidney disease: Secondary | ICD-10-CM | POA: Diagnosis not present

## 2019-12-21 DIAGNOSIS — N2581 Secondary hyperparathyroidism of renal origin: Secondary | ICD-10-CM | POA: Diagnosis not present

## 2019-12-21 DIAGNOSIS — D631 Anemia in chronic kidney disease: Secondary | ICD-10-CM | POA: Diagnosis not present

## 2019-12-21 DIAGNOSIS — N186 End stage renal disease: Secondary | ICD-10-CM | POA: Diagnosis not present

## 2019-12-21 DIAGNOSIS — Z992 Dependence on renal dialysis: Secondary | ICD-10-CM | POA: Diagnosis not present

## 2019-12-22 ENCOUNTER — Ambulatory Visit
Admission: RE | Admit: 2019-12-22 | Discharge: 2019-12-22 | Disposition: A | Discharge status: Home / Self Care | Payer: Medicare Other | Source: Ambulatory Visit | Attending: Internal Medicine | Admitting: Internal Medicine

## 2019-12-22 ENCOUNTER — Other Ambulatory Visit: Payer: Self-pay

## 2019-12-22 DIAGNOSIS — N2581 Secondary hyperparathyroidism of renal origin: Secondary | ICD-10-CM | POA: Diagnosis not present

## 2019-12-22 DIAGNOSIS — Z1231 Encounter for screening mammogram for malignant neoplasm of breast: Secondary | ICD-10-CM | POA: Diagnosis not present

## 2019-12-22 DIAGNOSIS — D631 Anemia in chronic kidney disease: Secondary | ICD-10-CM | POA: Diagnosis not present

## 2019-12-22 DIAGNOSIS — N186 End stage renal disease: Secondary | ICD-10-CM | POA: Diagnosis not present

## 2019-12-23 DIAGNOSIS — N2581 Secondary hyperparathyroidism of renal origin: Secondary | ICD-10-CM | POA: Diagnosis not present

## 2019-12-23 DIAGNOSIS — D631 Anemia in chronic kidney disease: Secondary | ICD-10-CM | POA: Diagnosis not present

## 2019-12-23 DIAGNOSIS — N186 End stage renal disease: Secondary | ICD-10-CM | POA: Diagnosis not present

## 2019-12-24 DIAGNOSIS — D631 Anemia in chronic kidney disease: Secondary | ICD-10-CM | POA: Diagnosis not present

## 2019-12-24 DIAGNOSIS — N2581 Secondary hyperparathyroidism of renal origin: Secondary | ICD-10-CM | POA: Diagnosis not present

## 2019-12-24 DIAGNOSIS — N186 End stage renal disease: Secondary | ICD-10-CM | POA: Diagnosis not present

## 2019-12-25 DIAGNOSIS — D631 Anemia in chronic kidney disease: Secondary | ICD-10-CM | POA: Diagnosis not present

## 2019-12-25 DIAGNOSIS — N2581 Secondary hyperparathyroidism of renal origin: Secondary | ICD-10-CM | POA: Diagnosis not present

## 2019-12-25 DIAGNOSIS — N186 End stage renal disease: Secondary | ICD-10-CM | POA: Diagnosis not present

## 2019-12-26 DIAGNOSIS — N2581 Secondary hyperparathyroidism of renal origin: Secondary | ICD-10-CM | POA: Diagnosis not present

## 2019-12-26 DIAGNOSIS — D631 Anemia in chronic kidney disease: Secondary | ICD-10-CM | POA: Diagnosis not present

## 2019-12-26 DIAGNOSIS — N186 End stage renal disease: Secondary | ICD-10-CM | POA: Diagnosis not present

## 2019-12-27 DIAGNOSIS — D631 Anemia in chronic kidney disease: Secondary | ICD-10-CM | POA: Diagnosis not present

## 2019-12-27 DIAGNOSIS — N186 End stage renal disease: Secondary | ICD-10-CM | POA: Diagnosis not present

## 2019-12-27 DIAGNOSIS — N2581 Secondary hyperparathyroidism of renal origin: Secondary | ICD-10-CM | POA: Diagnosis not present

## 2019-12-28 DIAGNOSIS — D631 Anemia in chronic kidney disease: Secondary | ICD-10-CM | POA: Diagnosis not present

## 2019-12-28 DIAGNOSIS — N2581 Secondary hyperparathyroidism of renal origin: Secondary | ICD-10-CM | POA: Diagnosis not present

## 2019-12-28 DIAGNOSIS — N186 End stage renal disease: Secondary | ICD-10-CM | POA: Diagnosis not present

## 2019-12-29 DIAGNOSIS — N2581 Secondary hyperparathyroidism of renal origin: Secondary | ICD-10-CM | POA: Diagnosis not present

## 2019-12-29 DIAGNOSIS — D631 Anemia in chronic kidney disease: Secondary | ICD-10-CM | POA: Diagnosis not present

## 2019-12-29 DIAGNOSIS — N186 End stage renal disease: Secondary | ICD-10-CM | POA: Diagnosis not present

## 2019-12-30 DIAGNOSIS — D631 Anemia in chronic kidney disease: Secondary | ICD-10-CM | POA: Diagnosis not present

## 2019-12-30 DIAGNOSIS — N186 End stage renal disease: Secondary | ICD-10-CM | POA: Diagnosis not present

## 2019-12-30 DIAGNOSIS — N2581 Secondary hyperparathyroidism of renal origin: Secondary | ICD-10-CM | POA: Diagnosis not present

## 2019-12-31 DIAGNOSIS — N186 End stage renal disease: Secondary | ICD-10-CM | POA: Diagnosis not present

## 2019-12-31 DIAGNOSIS — N2581 Secondary hyperparathyroidism of renal origin: Secondary | ICD-10-CM | POA: Diagnosis not present

## 2019-12-31 DIAGNOSIS — D631 Anemia in chronic kidney disease: Secondary | ICD-10-CM | POA: Diagnosis not present

## 2020-01-01 DIAGNOSIS — N2581 Secondary hyperparathyroidism of renal origin: Secondary | ICD-10-CM | POA: Diagnosis not present

## 2020-01-01 DIAGNOSIS — D631 Anemia in chronic kidney disease: Secondary | ICD-10-CM | POA: Diagnosis not present

## 2020-01-01 DIAGNOSIS — N186 End stage renal disease: Secondary | ICD-10-CM | POA: Diagnosis not present

## 2020-01-02 DIAGNOSIS — D631 Anemia in chronic kidney disease: Secondary | ICD-10-CM | POA: Diagnosis not present

## 2020-01-02 DIAGNOSIS — N2581 Secondary hyperparathyroidism of renal origin: Secondary | ICD-10-CM | POA: Diagnosis not present

## 2020-01-02 DIAGNOSIS — N186 End stage renal disease: Secondary | ICD-10-CM | POA: Diagnosis not present

## 2020-01-03 DIAGNOSIS — N2581 Secondary hyperparathyroidism of renal origin: Secondary | ICD-10-CM | POA: Diagnosis not present

## 2020-01-03 DIAGNOSIS — D631 Anemia in chronic kidney disease: Secondary | ICD-10-CM | POA: Diagnosis not present

## 2020-01-03 DIAGNOSIS — N186 End stage renal disease: Secondary | ICD-10-CM | POA: Diagnosis not present

## 2020-01-04 DIAGNOSIS — N186 End stage renal disease: Secondary | ICD-10-CM | POA: Diagnosis not present

## 2020-01-04 DIAGNOSIS — N2581 Secondary hyperparathyroidism of renal origin: Secondary | ICD-10-CM | POA: Diagnosis not present

## 2020-01-04 DIAGNOSIS — D631 Anemia in chronic kidney disease: Secondary | ICD-10-CM | POA: Diagnosis not present

## 2020-01-05 DIAGNOSIS — N2581 Secondary hyperparathyroidism of renal origin: Secondary | ICD-10-CM | POA: Diagnosis not present

## 2020-01-05 DIAGNOSIS — D631 Anemia in chronic kidney disease: Secondary | ICD-10-CM | POA: Diagnosis not present

## 2020-01-05 DIAGNOSIS — N186 End stage renal disease: Secondary | ICD-10-CM | POA: Diagnosis not present

## 2020-01-06 DIAGNOSIS — D631 Anemia in chronic kidney disease: Secondary | ICD-10-CM | POA: Diagnosis not present

## 2020-01-06 DIAGNOSIS — N186 End stage renal disease: Secondary | ICD-10-CM | POA: Diagnosis not present

## 2020-01-06 DIAGNOSIS — N2581 Secondary hyperparathyroidism of renal origin: Secondary | ICD-10-CM | POA: Diagnosis not present

## 2020-01-07 DIAGNOSIS — N2581 Secondary hyperparathyroidism of renal origin: Secondary | ICD-10-CM | POA: Diagnosis not present

## 2020-01-07 DIAGNOSIS — Z3042 Encounter for surveillance of injectable contraceptive: Secondary | ICD-10-CM | POA: Diagnosis not present

## 2020-01-07 DIAGNOSIS — N186 End stage renal disease: Secondary | ICD-10-CM | POA: Diagnosis not present

## 2020-01-07 DIAGNOSIS — D539 Nutritional anemia, unspecified: Secondary | ICD-10-CM | POA: Diagnosis not present

## 2020-01-07 DIAGNOSIS — E039 Hypothyroidism, unspecified: Secondary | ICD-10-CM | POA: Diagnosis not present

## 2020-01-07 DIAGNOSIS — D631 Anemia in chronic kidney disease: Secondary | ICD-10-CM | POA: Diagnosis not present

## 2020-01-07 DIAGNOSIS — E782 Mixed hyperlipidemia: Secondary | ICD-10-CM | POA: Diagnosis not present

## 2020-01-07 DIAGNOSIS — R569 Unspecified convulsions: Secondary | ICD-10-CM | POA: Diagnosis not present

## 2020-01-07 DIAGNOSIS — N926 Irregular menstruation, unspecified: Secondary | ICD-10-CM | POA: Diagnosis not present

## 2020-01-08 DIAGNOSIS — N186 End stage renal disease: Secondary | ICD-10-CM | POA: Diagnosis not present

## 2020-01-08 DIAGNOSIS — N2581 Secondary hyperparathyroidism of renal origin: Secondary | ICD-10-CM | POA: Diagnosis not present

## 2020-01-08 DIAGNOSIS — D631 Anemia in chronic kidney disease: Secondary | ICD-10-CM | POA: Diagnosis not present

## 2020-01-09 DIAGNOSIS — N2581 Secondary hyperparathyroidism of renal origin: Secondary | ICD-10-CM | POA: Diagnosis not present

## 2020-01-09 DIAGNOSIS — D631 Anemia in chronic kidney disease: Secondary | ICD-10-CM | POA: Diagnosis not present

## 2020-01-09 DIAGNOSIS — N186 End stage renal disease: Secondary | ICD-10-CM | POA: Diagnosis not present

## 2020-01-10 DIAGNOSIS — N2581 Secondary hyperparathyroidism of renal origin: Secondary | ICD-10-CM | POA: Diagnosis not present

## 2020-01-10 DIAGNOSIS — D631 Anemia in chronic kidney disease: Secondary | ICD-10-CM | POA: Diagnosis not present

## 2020-01-10 DIAGNOSIS — N186 End stage renal disease: Secondary | ICD-10-CM | POA: Diagnosis not present

## 2020-01-11 DIAGNOSIS — D631 Anemia in chronic kidney disease: Secondary | ICD-10-CM | POA: Diagnosis not present

## 2020-01-11 DIAGNOSIS — N2581 Secondary hyperparathyroidism of renal origin: Secondary | ICD-10-CM | POA: Diagnosis not present

## 2020-01-11 DIAGNOSIS — N186 End stage renal disease: Secondary | ICD-10-CM | POA: Diagnosis not present

## 2020-01-12 DIAGNOSIS — N2581 Secondary hyperparathyroidism of renal origin: Secondary | ICD-10-CM | POA: Diagnosis not present

## 2020-01-12 DIAGNOSIS — D631 Anemia in chronic kidney disease: Secondary | ICD-10-CM | POA: Diagnosis not present

## 2020-01-12 DIAGNOSIS — N186 End stage renal disease: Secondary | ICD-10-CM | POA: Diagnosis not present

## 2020-01-13 DIAGNOSIS — D631 Anemia in chronic kidney disease: Secondary | ICD-10-CM | POA: Diagnosis not present

## 2020-01-13 DIAGNOSIS — N2581 Secondary hyperparathyroidism of renal origin: Secondary | ICD-10-CM | POA: Diagnosis not present

## 2020-01-13 DIAGNOSIS — N186 End stage renal disease: Secondary | ICD-10-CM | POA: Diagnosis not present

## 2020-01-14 DIAGNOSIS — N186 End stage renal disease: Secondary | ICD-10-CM | POA: Diagnosis not present

## 2020-01-14 DIAGNOSIS — D631 Anemia in chronic kidney disease: Secondary | ICD-10-CM | POA: Diagnosis not present

## 2020-01-14 DIAGNOSIS — N2581 Secondary hyperparathyroidism of renal origin: Secondary | ICD-10-CM | POA: Diagnosis not present

## 2020-01-15 DIAGNOSIS — N2581 Secondary hyperparathyroidism of renal origin: Secondary | ICD-10-CM | POA: Diagnosis not present

## 2020-01-15 DIAGNOSIS — R140 Abdominal distension (gaseous): Secondary | ICD-10-CM | POA: Diagnosis not present

## 2020-01-15 DIAGNOSIS — D631 Anemia in chronic kidney disease: Secondary | ICD-10-CM | POA: Diagnosis not present

## 2020-01-15 DIAGNOSIS — N186 End stage renal disease: Secondary | ICD-10-CM | POA: Diagnosis not present

## 2020-01-16 DIAGNOSIS — N186 End stage renal disease: Secondary | ICD-10-CM | POA: Diagnosis not present

## 2020-01-16 DIAGNOSIS — N2581 Secondary hyperparathyroidism of renal origin: Secondary | ICD-10-CM | POA: Diagnosis not present

## 2020-01-16 DIAGNOSIS — D631 Anemia in chronic kidney disease: Secondary | ICD-10-CM | POA: Diagnosis not present

## 2020-01-17 DIAGNOSIS — D631 Anemia in chronic kidney disease: Secondary | ICD-10-CM | POA: Diagnosis not present

## 2020-01-17 DIAGNOSIS — N186 End stage renal disease: Secondary | ICD-10-CM | POA: Diagnosis not present

## 2020-01-17 DIAGNOSIS — N2581 Secondary hyperparathyroidism of renal origin: Secondary | ICD-10-CM | POA: Diagnosis not present

## 2020-01-18 DIAGNOSIS — N2581 Secondary hyperparathyroidism of renal origin: Secondary | ICD-10-CM | POA: Diagnosis not present

## 2020-01-18 DIAGNOSIS — D631 Anemia in chronic kidney disease: Secondary | ICD-10-CM | POA: Diagnosis not present

## 2020-01-18 DIAGNOSIS — N186 End stage renal disease: Secondary | ICD-10-CM | POA: Diagnosis not present

## 2020-01-19 DIAGNOSIS — N2581 Secondary hyperparathyroidism of renal origin: Secondary | ICD-10-CM | POA: Diagnosis not present

## 2020-01-19 DIAGNOSIS — D631 Anemia in chronic kidney disease: Secondary | ICD-10-CM | POA: Diagnosis not present

## 2020-01-19 DIAGNOSIS — N186 End stage renal disease: Secondary | ICD-10-CM | POA: Diagnosis not present

## 2020-01-20 DIAGNOSIS — N2581 Secondary hyperparathyroidism of renal origin: Secondary | ICD-10-CM | POA: Diagnosis not present

## 2020-01-20 DIAGNOSIS — N186 End stage renal disease: Secondary | ICD-10-CM | POA: Diagnosis not present

## 2020-01-20 DIAGNOSIS — D631 Anemia in chronic kidney disease: Secondary | ICD-10-CM | POA: Diagnosis not present

## 2020-01-21 DIAGNOSIS — D631 Anemia in chronic kidney disease: Secondary | ICD-10-CM | POA: Diagnosis not present

## 2020-01-21 DIAGNOSIS — N186 End stage renal disease: Secondary | ICD-10-CM | POA: Diagnosis not present

## 2020-01-21 DIAGNOSIS — N2581 Secondary hyperparathyroidism of renal origin: Secondary | ICD-10-CM | POA: Diagnosis not present

## 2020-01-21 DIAGNOSIS — Z992 Dependence on renal dialysis: Secondary | ICD-10-CM | POA: Diagnosis not present

## 2020-01-22 DIAGNOSIS — N2581 Secondary hyperparathyroidism of renal origin: Secondary | ICD-10-CM | POA: Diagnosis not present

## 2020-01-22 DIAGNOSIS — R93429 Abnormal radiologic findings on diagnostic imaging of unspecified kidney: Secondary | ICD-10-CM | POA: Diagnosis not present

## 2020-01-22 DIAGNOSIS — R188 Other ascites: Secondary | ICD-10-CM | POA: Diagnosis not present

## 2020-01-22 DIAGNOSIS — N186 End stage renal disease: Secondary | ICD-10-CM | POA: Diagnosis not present

## 2020-01-22 DIAGNOSIS — D631 Anemia in chronic kidney disease: Secondary | ICD-10-CM | POA: Diagnosis not present

## 2020-01-23 DIAGNOSIS — N2581 Secondary hyperparathyroidism of renal origin: Secondary | ICD-10-CM | POA: Diagnosis not present

## 2020-01-23 DIAGNOSIS — D631 Anemia in chronic kidney disease: Secondary | ICD-10-CM | POA: Diagnosis not present

## 2020-01-23 DIAGNOSIS — N186 End stage renal disease: Secondary | ICD-10-CM | POA: Diagnosis not present

## 2020-01-24 DIAGNOSIS — N2581 Secondary hyperparathyroidism of renal origin: Secondary | ICD-10-CM | POA: Diagnosis not present

## 2020-01-24 DIAGNOSIS — D631 Anemia in chronic kidney disease: Secondary | ICD-10-CM | POA: Diagnosis not present

## 2020-01-24 DIAGNOSIS — N186 End stage renal disease: Secondary | ICD-10-CM | POA: Diagnosis not present

## 2020-01-25 DIAGNOSIS — D631 Anemia in chronic kidney disease: Secondary | ICD-10-CM | POA: Diagnosis not present

## 2020-01-25 DIAGNOSIS — N186 End stage renal disease: Secondary | ICD-10-CM | POA: Diagnosis not present

## 2020-01-25 DIAGNOSIS — N2581 Secondary hyperparathyroidism of renal origin: Secondary | ICD-10-CM | POA: Diagnosis not present

## 2020-01-26 DIAGNOSIS — N186 End stage renal disease: Secondary | ICD-10-CM | POA: Diagnosis not present

## 2020-01-26 DIAGNOSIS — D631 Anemia in chronic kidney disease: Secondary | ICD-10-CM | POA: Diagnosis not present

## 2020-01-26 DIAGNOSIS — N2581 Secondary hyperparathyroidism of renal origin: Secondary | ICD-10-CM | POA: Diagnosis not present

## 2020-01-27 DIAGNOSIS — N186 End stage renal disease: Secondary | ICD-10-CM | POA: Diagnosis not present

## 2020-01-27 DIAGNOSIS — N2581 Secondary hyperparathyroidism of renal origin: Secondary | ICD-10-CM | POA: Diagnosis not present

## 2020-01-27 DIAGNOSIS — D631 Anemia in chronic kidney disease: Secondary | ICD-10-CM | POA: Diagnosis not present

## 2020-01-28 DIAGNOSIS — N2581 Secondary hyperparathyroidism of renal origin: Secondary | ICD-10-CM | POA: Diagnosis not present

## 2020-01-28 DIAGNOSIS — D631 Anemia in chronic kidney disease: Secondary | ICD-10-CM | POA: Diagnosis not present

## 2020-01-28 DIAGNOSIS — N186 End stage renal disease: Secondary | ICD-10-CM | POA: Diagnosis not present

## 2020-01-29 DIAGNOSIS — D631 Anemia in chronic kidney disease: Secondary | ICD-10-CM | POA: Diagnosis not present

## 2020-01-29 DIAGNOSIS — N186 End stage renal disease: Secondary | ICD-10-CM | POA: Diagnosis not present

## 2020-01-29 DIAGNOSIS — N2581 Secondary hyperparathyroidism of renal origin: Secondary | ICD-10-CM | POA: Diagnosis not present

## 2020-01-30 DIAGNOSIS — D631 Anemia in chronic kidney disease: Secondary | ICD-10-CM | POA: Diagnosis not present

## 2020-01-30 DIAGNOSIS — N2581 Secondary hyperparathyroidism of renal origin: Secondary | ICD-10-CM | POA: Diagnosis not present

## 2020-01-30 DIAGNOSIS — Z4932 Encounter for adequacy testing for peritoneal dialysis: Secondary | ICD-10-CM | POA: Diagnosis not present

## 2020-01-30 DIAGNOSIS — N186 End stage renal disease: Secondary | ICD-10-CM | POA: Diagnosis not present

## 2020-01-31 DIAGNOSIS — D631 Anemia in chronic kidney disease: Secondary | ICD-10-CM | POA: Diagnosis not present

## 2020-01-31 DIAGNOSIS — N186 End stage renal disease: Secondary | ICD-10-CM | POA: Diagnosis not present

## 2020-01-31 DIAGNOSIS — N2581 Secondary hyperparathyroidism of renal origin: Secondary | ICD-10-CM | POA: Diagnosis not present

## 2020-02-01 DIAGNOSIS — N186 End stage renal disease: Secondary | ICD-10-CM | POA: Diagnosis not present

## 2020-02-01 DIAGNOSIS — N2581 Secondary hyperparathyroidism of renal origin: Secondary | ICD-10-CM | POA: Diagnosis not present

## 2020-02-01 DIAGNOSIS — D631 Anemia in chronic kidney disease: Secondary | ICD-10-CM | POA: Diagnosis not present

## 2020-02-02 DIAGNOSIS — N2581 Secondary hyperparathyroidism of renal origin: Secondary | ICD-10-CM | POA: Diagnosis not present

## 2020-02-02 DIAGNOSIS — D631 Anemia in chronic kidney disease: Secondary | ICD-10-CM | POA: Diagnosis not present

## 2020-02-02 DIAGNOSIS — N186 End stage renal disease: Secondary | ICD-10-CM | POA: Diagnosis not present

## 2020-02-03 DIAGNOSIS — D631 Anemia in chronic kidney disease: Secondary | ICD-10-CM | POA: Diagnosis not present

## 2020-02-03 DIAGNOSIS — N2581 Secondary hyperparathyroidism of renal origin: Secondary | ICD-10-CM | POA: Diagnosis not present

## 2020-02-03 DIAGNOSIS — N186 End stage renal disease: Secondary | ICD-10-CM | POA: Diagnosis not present

## 2020-02-04 DIAGNOSIS — N2581 Secondary hyperparathyroidism of renal origin: Secondary | ICD-10-CM | POA: Diagnosis not present

## 2020-02-04 DIAGNOSIS — N186 End stage renal disease: Secondary | ICD-10-CM | POA: Diagnosis not present

## 2020-02-04 DIAGNOSIS — D631 Anemia in chronic kidney disease: Secondary | ICD-10-CM | POA: Diagnosis not present

## 2020-02-05 DIAGNOSIS — N186 End stage renal disease: Secondary | ICD-10-CM | POA: Diagnosis not present

## 2020-02-05 DIAGNOSIS — D631 Anemia in chronic kidney disease: Secondary | ICD-10-CM | POA: Diagnosis not present

## 2020-02-05 DIAGNOSIS — N2581 Secondary hyperparathyroidism of renal origin: Secondary | ICD-10-CM | POA: Diagnosis not present

## 2020-02-06 DIAGNOSIS — D631 Anemia in chronic kidney disease: Secondary | ICD-10-CM | POA: Diagnosis not present

## 2020-02-06 DIAGNOSIS — N2581 Secondary hyperparathyroidism of renal origin: Secondary | ICD-10-CM | POA: Diagnosis not present

## 2020-02-06 DIAGNOSIS — N186 End stage renal disease: Secondary | ICD-10-CM | POA: Diagnosis not present

## 2020-02-07 DIAGNOSIS — N186 End stage renal disease: Secondary | ICD-10-CM | POA: Diagnosis not present

## 2020-02-07 DIAGNOSIS — D631 Anemia in chronic kidney disease: Secondary | ICD-10-CM | POA: Diagnosis not present

## 2020-02-07 DIAGNOSIS — N2581 Secondary hyperparathyroidism of renal origin: Secondary | ICD-10-CM | POA: Diagnosis not present

## 2020-02-08 DIAGNOSIS — D631 Anemia in chronic kidney disease: Secondary | ICD-10-CM | POA: Diagnosis not present

## 2020-02-08 DIAGNOSIS — N186 End stage renal disease: Secondary | ICD-10-CM | POA: Diagnosis not present

## 2020-02-08 DIAGNOSIS — N2581 Secondary hyperparathyroidism of renal origin: Secondary | ICD-10-CM | POA: Diagnosis not present

## 2020-02-09 DIAGNOSIS — N186 End stage renal disease: Secondary | ICD-10-CM | POA: Diagnosis not present

## 2020-02-09 DIAGNOSIS — N2581 Secondary hyperparathyroidism of renal origin: Secondary | ICD-10-CM | POA: Diagnosis not present

## 2020-02-09 DIAGNOSIS — D631 Anemia in chronic kidney disease: Secondary | ICD-10-CM | POA: Diagnosis not present

## 2020-02-10 DIAGNOSIS — D631 Anemia in chronic kidney disease: Secondary | ICD-10-CM | POA: Diagnosis not present

## 2020-02-10 DIAGNOSIS — N186 End stage renal disease: Secondary | ICD-10-CM | POA: Diagnosis not present

## 2020-02-10 DIAGNOSIS — N2581 Secondary hyperparathyroidism of renal origin: Secondary | ICD-10-CM | POA: Diagnosis not present

## 2020-02-11 DIAGNOSIS — N186 End stage renal disease: Secondary | ICD-10-CM | POA: Diagnosis not present

## 2020-02-11 DIAGNOSIS — N2581 Secondary hyperparathyroidism of renal origin: Secondary | ICD-10-CM | POA: Diagnosis not present

## 2020-02-11 DIAGNOSIS — D631 Anemia in chronic kidney disease: Secondary | ICD-10-CM | POA: Diagnosis not present

## 2020-02-12 DIAGNOSIS — N186 End stage renal disease: Secondary | ICD-10-CM | POA: Diagnosis not present

## 2020-02-12 DIAGNOSIS — D631 Anemia in chronic kidney disease: Secondary | ICD-10-CM | POA: Diagnosis not present

## 2020-02-12 DIAGNOSIS — N2581 Secondary hyperparathyroidism of renal origin: Secondary | ICD-10-CM | POA: Diagnosis not present

## 2020-02-13 DIAGNOSIS — N2581 Secondary hyperparathyroidism of renal origin: Secondary | ICD-10-CM | POA: Diagnosis not present

## 2020-02-13 DIAGNOSIS — N186 End stage renal disease: Secondary | ICD-10-CM | POA: Diagnosis not present

## 2020-02-13 DIAGNOSIS — D631 Anemia in chronic kidney disease: Secondary | ICD-10-CM | POA: Diagnosis not present

## 2020-02-14 DIAGNOSIS — N186 End stage renal disease: Secondary | ICD-10-CM | POA: Diagnosis not present

## 2020-02-14 DIAGNOSIS — N2581 Secondary hyperparathyroidism of renal origin: Secondary | ICD-10-CM | POA: Diagnosis not present

## 2020-02-14 DIAGNOSIS — D631 Anemia in chronic kidney disease: Secondary | ICD-10-CM | POA: Diagnosis not present

## 2020-02-15 DIAGNOSIS — D631 Anemia in chronic kidney disease: Secondary | ICD-10-CM | POA: Diagnosis not present

## 2020-02-15 DIAGNOSIS — N2581 Secondary hyperparathyroidism of renal origin: Secondary | ICD-10-CM | POA: Diagnosis not present

## 2020-02-15 DIAGNOSIS — N186 End stage renal disease: Secondary | ICD-10-CM | POA: Diagnosis not present

## 2020-02-16 DIAGNOSIS — D631 Anemia in chronic kidney disease: Secondary | ICD-10-CM | POA: Diagnosis not present

## 2020-02-16 DIAGNOSIS — N186 End stage renal disease: Secondary | ICD-10-CM | POA: Diagnosis not present

## 2020-02-16 DIAGNOSIS — N2581 Secondary hyperparathyroidism of renal origin: Secondary | ICD-10-CM | POA: Diagnosis not present

## 2020-02-17 DIAGNOSIS — D631 Anemia in chronic kidney disease: Secondary | ICD-10-CM | POA: Diagnosis not present

## 2020-02-17 DIAGNOSIS — N2581 Secondary hyperparathyroidism of renal origin: Secondary | ICD-10-CM | POA: Diagnosis not present

## 2020-02-17 DIAGNOSIS — N186 End stage renal disease: Secondary | ICD-10-CM | POA: Diagnosis not present

## 2020-02-18 DIAGNOSIS — N2581 Secondary hyperparathyroidism of renal origin: Secondary | ICD-10-CM | POA: Diagnosis not present

## 2020-02-18 DIAGNOSIS — N186 End stage renal disease: Secondary | ICD-10-CM | POA: Diagnosis not present

## 2020-02-18 DIAGNOSIS — D631 Anemia in chronic kidney disease: Secondary | ICD-10-CM | POA: Diagnosis not present

## 2020-02-19 DIAGNOSIS — D631 Anemia in chronic kidney disease: Secondary | ICD-10-CM | POA: Diagnosis not present

## 2020-02-19 DIAGNOSIS — N2581 Secondary hyperparathyroidism of renal origin: Secondary | ICD-10-CM | POA: Diagnosis not present

## 2020-02-19 DIAGNOSIS — N186 End stage renal disease: Secondary | ICD-10-CM | POA: Diagnosis not present

## 2020-02-20 DIAGNOSIS — N186 End stage renal disease: Secondary | ICD-10-CM | POA: Diagnosis not present

## 2020-02-20 DIAGNOSIS — Z992 Dependence on renal dialysis: Secondary | ICD-10-CM | POA: Diagnosis not present

## 2020-02-20 DIAGNOSIS — N2581 Secondary hyperparathyroidism of renal origin: Secondary | ICD-10-CM | POA: Diagnosis not present

## 2020-02-20 DIAGNOSIS — D631 Anemia in chronic kidney disease: Secondary | ICD-10-CM | POA: Diagnosis not present

## 2020-02-21 DIAGNOSIS — D631 Anemia in chronic kidney disease: Secondary | ICD-10-CM | POA: Diagnosis not present

## 2020-02-21 DIAGNOSIS — N186 End stage renal disease: Secondary | ICD-10-CM | POA: Diagnosis not present

## 2020-02-21 DIAGNOSIS — N2581 Secondary hyperparathyroidism of renal origin: Secondary | ICD-10-CM | POA: Diagnosis not present

## 2020-02-22 DIAGNOSIS — N186 End stage renal disease: Secondary | ICD-10-CM | POA: Diagnosis not present

## 2020-02-22 DIAGNOSIS — D631 Anemia in chronic kidney disease: Secondary | ICD-10-CM | POA: Diagnosis not present

## 2020-02-22 DIAGNOSIS — N2581 Secondary hyperparathyroidism of renal origin: Secondary | ICD-10-CM | POA: Diagnosis not present

## 2020-02-23 DIAGNOSIS — N2581 Secondary hyperparathyroidism of renal origin: Secondary | ICD-10-CM | POA: Diagnosis not present

## 2020-02-23 DIAGNOSIS — D631 Anemia in chronic kidney disease: Secondary | ICD-10-CM | POA: Diagnosis not present

## 2020-02-23 DIAGNOSIS — N186 End stage renal disease: Secondary | ICD-10-CM | POA: Diagnosis not present

## 2020-02-24 DIAGNOSIS — N2581 Secondary hyperparathyroidism of renal origin: Secondary | ICD-10-CM | POA: Diagnosis not present

## 2020-02-24 DIAGNOSIS — N186 End stage renal disease: Secondary | ICD-10-CM | POA: Diagnosis not present

## 2020-02-24 DIAGNOSIS — D631 Anemia in chronic kidney disease: Secondary | ICD-10-CM | POA: Diagnosis not present

## 2020-02-25 DIAGNOSIS — D631 Anemia in chronic kidney disease: Secondary | ICD-10-CM | POA: Diagnosis not present

## 2020-02-25 DIAGNOSIS — N2581 Secondary hyperparathyroidism of renal origin: Secondary | ICD-10-CM | POA: Diagnosis not present

## 2020-02-25 DIAGNOSIS — N186 End stage renal disease: Secondary | ICD-10-CM | POA: Diagnosis not present

## 2020-02-26 DIAGNOSIS — N186 End stage renal disease: Secondary | ICD-10-CM | POA: Diagnosis not present

## 2020-02-26 DIAGNOSIS — N2581 Secondary hyperparathyroidism of renal origin: Secondary | ICD-10-CM | POA: Diagnosis not present

## 2020-02-26 DIAGNOSIS — D631 Anemia in chronic kidney disease: Secondary | ICD-10-CM | POA: Diagnosis not present

## 2020-02-27 DIAGNOSIS — N186 End stage renal disease: Secondary | ICD-10-CM | POA: Diagnosis not present

## 2020-02-27 DIAGNOSIS — D631 Anemia in chronic kidney disease: Secondary | ICD-10-CM | POA: Diagnosis not present

## 2020-02-27 DIAGNOSIS — N2581 Secondary hyperparathyroidism of renal origin: Secondary | ICD-10-CM | POA: Diagnosis not present

## 2020-02-28 DIAGNOSIS — D631 Anemia in chronic kidney disease: Secondary | ICD-10-CM | POA: Diagnosis not present

## 2020-02-28 DIAGNOSIS — N186 End stage renal disease: Secondary | ICD-10-CM | POA: Diagnosis not present

## 2020-02-28 DIAGNOSIS — N2581 Secondary hyperparathyroidism of renal origin: Secondary | ICD-10-CM | POA: Diagnosis not present

## 2020-02-29 DIAGNOSIS — D631 Anemia in chronic kidney disease: Secondary | ICD-10-CM | POA: Diagnosis not present

## 2020-02-29 DIAGNOSIS — N186 End stage renal disease: Secondary | ICD-10-CM | POA: Diagnosis not present

## 2020-02-29 DIAGNOSIS — N2581 Secondary hyperparathyroidism of renal origin: Secondary | ICD-10-CM | POA: Diagnosis not present

## 2020-03-01 DIAGNOSIS — D631 Anemia in chronic kidney disease: Secondary | ICD-10-CM | POA: Diagnosis not present

## 2020-03-01 DIAGNOSIS — N2581 Secondary hyperparathyroidism of renal origin: Secondary | ICD-10-CM | POA: Diagnosis not present

## 2020-03-01 DIAGNOSIS — N186 End stage renal disease: Secondary | ICD-10-CM | POA: Diagnosis not present

## 2020-03-02 DIAGNOSIS — N186 End stage renal disease: Secondary | ICD-10-CM | POA: Diagnosis not present

## 2020-03-02 DIAGNOSIS — D631 Anemia in chronic kidney disease: Secondary | ICD-10-CM | POA: Diagnosis not present

## 2020-03-02 DIAGNOSIS — N2581 Secondary hyperparathyroidism of renal origin: Secondary | ICD-10-CM | POA: Diagnosis not present

## 2020-03-03 DIAGNOSIS — N186 End stage renal disease: Secondary | ICD-10-CM | POA: Diagnosis not present

## 2020-03-03 DIAGNOSIS — N2581 Secondary hyperparathyroidism of renal origin: Secondary | ICD-10-CM | POA: Diagnosis not present

## 2020-03-03 DIAGNOSIS — D631 Anemia in chronic kidney disease: Secondary | ICD-10-CM | POA: Diagnosis not present

## 2020-03-04 DIAGNOSIS — N2581 Secondary hyperparathyroidism of renal origin: Secondary | ICD-10-CM | POA: Diagnosis not present

## 2020-03-04 DIAGNOSIS — N186 End stage renal disease: Secondary | ICD-10-CM | POA: Diagnosis not present

## 2020-03-04 DIAGNOSIS — D631 Anemia in chronic kidney disease: Secondary | ICD-10-CM | POA: Diagnosis not present

## 2020-03-05 DIAGNOSIS — N2581 Secondary hyperparathyroidism of renal origin: Secondary | ICD-10-CM | POA: Diagnosis not present

## 2020-03-05 DIAGNOSIS — N186 End stage renal disease: Secondary | ICD-10-CM | POA: Diagnosis not present

## 2020-03-05 DIAGNOSIS — D631 Anemia in chronic kidney disease: Secondary | ICD-10-CM | POA: Diagnosis not present

## 2020-03-06 DIAGNOSIS — N186 End stage renal disease: Secondary | ICD-10-CM | POA: Diagnosis not present

## 2020-03-06 DIAGNOSIS — N2581 Secondary hyperparathyroidism of renal origin: Secondary | ICD-10-CM | POA: Diagnosis not present

## 2020-03-06 DIAGNOSIS — D631 Anemia in chronic kidney disease: Secondary | ICD-10-CM | POA: Diagnosis not present

## 2020-03-07 DIAGNOSIS — D631 Anemia in chronic kidney disease: Secondary | ICD-10-CM | POA: Diagnosis not present

## 2020-03-07 DIAGNOSIS — N186 End stage renal disease: Secondary | ICD-10-CM | POA: Diagnosis not present

## 2020-03-07 DIAGNOSIS — N2581 Secondary hyperparathyroidism of renal origin: Secondary | ICD-10-CM | POA: Diagnosis not present

## 2020-03-08 DIAGNOSIS — N186 End stage renal disease: Secondary | ICD-10-CM | POA: Diagnosis not present

## 2020-03-08 DIAGNOSIS — N2581 Secondary hyperparathyroidism of renal origin: Secondary | ICD-10-CM | POA: Diagnosis not present

## 2020-03-08 DIAGNOSIS — D631 Anemia in chronic kidney disease: Secondary | ICD-10-CM | POA: Diagnosis not present

## 2020-03-09 DIAGNOSIS — D631 Anemia in chronic kidney disease: Secondary | ICD-10-CM | POA: Diagnosis not present

## 2020-03-09 DIAGNOSIS — N186 End stage renal disease: Secondary | ICD-10-CM | POA: Diagnosis not present

## 2020-03-09 DIAGNOSIS — N2581 Secondary hyperparathyroidism of renal origin: Secondary | ICD-10-CM | POA: Diagnosis not present

## 2020-03-10 DIAGNOSIS — N186 End stage renal disease: Secondary | ICD-10-CM | POA: Diagnosis not present

## 2020-03-10 DIAGNOSIS — D631 Anemia in chronic kidney disease: Secondary | ICD-10-CM | POA: Diagnosis not present

## 2020-03-10 DIAGNOSIS — N2581 Secondary hyperparathyroidism of renal origin: Secondary | ICD-10-CM | POA: Diagnosis not present

## 2020-03-11 DIAGNOSIS — N2581 Secondary hyperparathyroidism of renal origin: Secondary | ICD-10-CM | POA: Diagnosis not present

## 2020-03-11 DIAGNOSIS — N186 End stage renal disease: Secondary | ICD-10-CM | POA: Diagnosis not present

## 2020-03-11 DIAGNOSIS — D631 Anemia in chronic kidney disease: Secondary | ICD-10-CM | POA: Diagnosis not present

## 2020-03-12 DIAGNOSIS — N2581 Secondary hyperparathyroidism of renal origin: Secondary | ICD-10-CM | POA: Diagnosis not present

## 2020-03-12 DIAGNOSIS — D631 Anemia in chronic kidney disease: Secondary | ICD-10-CM | POA: Diagnosis not present

## 2020-03-12 DIAGNOSIS — N186 End stage renal disease: Secondary | ICD-10-CM | POA: Diagnosis not present

## 2020-03-13 DIAGNOSIS — N2581 Secondary hyperparathyroidism of renal origin: Secondary | ICD-10-CM | POA: Diagnosis not present

## 2020-03-13 DIAGNOSIS — D631 Anemia in chronic kidney disease: Secondary | ICD-10-CM | POA: Diagnosis not present

## 2020-03-13 DIAGNOSIS — N186 End stage renal disease: Secondary | ICD-10-CM | POA: Diagnosis not present

## 2020-03-14 DIAGNOSIS — D631 Anemia in chronic kidney disease: Secondary | ICD-10-CM | POA: Diagnosis not present

## 2020-03-14 DIAGNOSIS — N186 End stage renal disease: Secondary | ICD-10-CM | POA: Diagnosis not present

## 2020-03-14 DIAGNOSIS — N2581 Secondary hyperparathyroidism of renal origin: Secondary | ICD-10-CM | POA: Diagnosis not present

## 2020-03-15 DIAGNOSIS — D631 Anemia in chronic kidney disease: Secondary | ICD-10-CM | POA: Diagnosis not present

## 2020-03-15 DIAGNOSIS — N2581 Secondary hyperparathyroidism of renal origin: Secondary | ICD-10-CM | POA: Diagnosis not present

## 2020-03-15 DIAGNOSIS — N186 End stage renal disease: Secondary | ICD-10-CM | POA: Diagnosis not present

## 2020-03-16 DIAGNOSIS — N186 End stage renal disease: Secondary | ICD-10-CM | POA: Diagnosis not present

## 2020-03-16 DIAGNOSIS — N2581 Secondary hyperparathyroidism of renal origin: Secondary | ICD-10-CM | POA: Diagnosis not present

## 2020-03-16 DIAGNOSIS — D631 Anemia in chronic kidney disease: Secondary | ICD-10-CM | POA: Diagnosis not present

## 2020-03-17 DIAGNOSIS — N2581 Secondary hyperparathyroidism of renal origin: Secondary | ICD-10-CM | POA: Diagnosis not present

## 2020-03-17 DIAGNOSIS — D631 Anemia in chronic kidney disease: Secondary | ICD-10-CM | POA: Diagnosis not present

## 2020-03-17 DIAGNOSIS — N186 End stage renal disease: Secondary | ICD-10-CM | POA: Diagnosis not present

## 2020-03-18 DIAGNOSIS — N2581 Secondary hyperparathyroidism of renal origin: Secondary | ICD-10-CM | POA: Diagnosis not present

## 2020-03-18 DIAGNOSIS — N186 End stage renal disease: Secondary | ICD-10-CM | POA: Diagnosis not present

## 2020-03-18 DIAGNOSIS — D631 Anemia in chronic kidney disease: Secondary | ICD-10-CM | POA: Diagnosis not present

## 2020-03-19 DIAGNOSIS — D631 Anemia in chronic kidney disease: Secondary | ICD-10-CM | POA: Diagnosis not present

## 2020-03-19 DIAGNOSIS — N186 End stage renal disease: Secondary | ICD-10-CM | POA: Diagnosis not present

## 2020-03-19 DIAGNOSIS — N2581 Secondary hyperparathyroidism of renal origin: Secondary | ICD-10-CM | POA: Diagnosis not present

## 2020-03-20 DIAGNOSIS — N2581 Secondary hyperparathyroidism of renal origin: Secondary | ICD-10-CM | POA: Diagnosis not present

## 2020-03-20 DIAGNOSIS — N186 End stage renal disease: Secondary | ICD-10-CM | POA: Diagnosis not present

## 2020-03-20 DIAGNOSIS — D631 Anemia in chronic kidney disease: Secondary | ICD-10-CM | POA: Diagnosis not present

## 2020-03-21 DIAGNOSIS — N2581 Secondary hyperparathyroidism of renal origin: Secondary | ICD-10-CM | POA: Diagnosis not present

## 2020-03-21 DIAGNOSIS — D631 Anemia in chronic kidney disease: Secondary | ICD-10-CM | POA: Diagnosis not present

## 2020-03-21 DIAGNOSIS — N186 End stage renal disease: Secondary | ICD-10-CM | POA: Diagnosis not present

## 2020-03-22 DIAGNOSIS — D631 Anemia in chronic kidney disease: Secondary | ICD-10-CM | POA: Diagnosis not present

## 2020-03-22 DIAGNOSIS — Z992 Dependence on renal dialysis: Secondary | ICD-10-CM | POA: Diagnosis not present

## 2020-03-22 DIAGNOSIS — N186 End stage renal disease: Secondary | ICD-10-CM | POA: Diagnosis not present

## 2020-03-22 DIAGNOSIS — N2581 Secondary hyperparathyroidism of renal origin: Secondary | ICD-10-CM | POA: Diagnosis not present

## 2020-04-07 DIAGNOSIS — E782 Mixed hyperlipidemia: Secondary | ICD-10-CM | POA: Diagnosis not present

## 2020-04-22 DIAGNOSIS — N2581 Secondary hyperparathyroidism of renal origin: Secondary | ICD-10-CM | POA: Diagnosis not present

## 2020-04-22 DIAGNOSIS — D631 Anemia in chronic kidney disease: Secondary | ICD-10-CM | POA: Diagnosis not present

## 2020-04-22 DIAGNOSIS — N186 End stage renal disease: Secondary | ICD-10-CM | POA: Diagnosis not present

## 2020-04-23 DIAGNOSIS — N2581 Secondary hyperparathyroidism of renal origin: Secondary | ICD-10-CM | POA: Diagnosis not present

## 2020-04-23 DIAGNOSIS — N186 End stage renal disease: Secondary | ICD-10-CM | POA: Diagnosis not present

## 2020-04-23 DIAGNOSIS — D631 Anemia in chronic kidney disease: Secondary | ICD-10-CM | POA: Diagnosis not present

## 2020-04-24 DIAGNOSIS — N186 End stage renal disease: Secondary | ICD-10-CM | POA: Diagnosis not present

## 2020-04-24 DIAGNOSIS — N2581 Secondary hyperparathyroidism of renal origin: Secondary | ICD-10-CM | POA: Diagnosis not present

## 2020-04-24 DIAGNOSIS — D631 Anemia in chronic kidney disease: Secondary | ICD-10-CM | POA: Diagnosis not present

## 2020-04-25 DIAGNOSIS — D631 Anemia in chronic kidney disease: Secondary | ICD-10-CM | POA: Diagnosis not present

## 2020-04-25 DIAGNOSIS — N2581 Secondary hyperparathyroidism of renal origin: Secondary | ICD-10-CM | POA: Diagnosis not present

## 2020-04-25 DIAGNOSIS — N186 End stage renal disease: Secondary | ICD-10-CM | POA: Diagnosis not present

## 2020-04-26 DIAGNOSIS — D631 Anemia in chronic kidney disease: Secondary | ICD-10-CM | POA: Diagnosis not present

## 2020-04-26 DIAGNOSIS — N2581 Secondary hyperparathyroidism of renal origin: Secondary | ICD-10-CM | POA: Diagnosis not present

## 2020-04-26 DIAGNOSIS — N186 End stage renal disease: Secondary | ICD-10-CM | POA: Diagnosis not present

## 2020-04-27 DIAGNOSIS — N2581 Secondary hyperparathyroidism of renal origin: Secondary | ICD-10-CM | POA: Diagnosis not present

## 2020-04-27 DIAGNOSIS — D631 Anemia in chronic kidney disease: Secondary | ICD-10-CM | POA: Diagnosis not present

## 2020-04-27 DIAGNOSIS — N186 End stage renal disease: Secondary | ICD-10-CM | POA: Diagnosis not present

## 2020-04-28 DIAGNOSIS — D631 Anemia in chronic kidney disease: Secondary | ICD-10-CM | POA: Diagnosis not present

## 2020-04-28 DIAGNOSIS — N186 End stage renal disease: Secondary | ICD-10-CM | POA: Diagnosis not present

## 2020-04-28 DIAGNOSIS — N2581 Secondary hyperparathyroidism of renal origin: Secondary | ICD-10-CM | POA: Diagnosis not present

## 2020-04-29 DIAGNOSIS — N2581 Secondary hyperparathyroidism of renal origin: Secondary | ICD-10-CM | POA: Diagnosis not present

## 2020-04-29 DIAGNOSIS — N186 End stage renal disease: Secondary | ICD-10-CM | POA: Diagnosis not present

## 2020-04-29 DIAGNOSIS — D631 Anemia in chronic kidney disease: Secondary | ICD-10-CM | POA: Diagnosis not present

## 2020-04-30 DIAGNOSIS — D631 Anemia in chronic kidney disease: Secondary | ICD-10-CM | POA: Diagnosis not present

## 2020-04-30 DIAGNOSIS — N186 End stage renal disease: Secondary | ICD-10-CM | POA: Diagnosis not present

## 2020-04-30 DIAGNOSIS — N2581 Secondary hyperparathyroidism of renal origin: Secondary | ICD-10-CM | POA: Diagnosis not present

## 2020-05-01 DIAGNOSIS — N186 End stage renal disease: Secondary | ICD-10-CM | POA: Diagnosis not present

## 2020-05-01 DIAGNOSIS — N2581 Secondary hyperparathyroidism of renal origin: Secondary | ICD-10-CM | POA: Diagnosis not present

## 2020-05-01 DIAGNOSIS — D631 Anemia in chronic kidney disease: Secondary | ICD-10-CM | POA: Diagnosis not present

## 2020-05-02 DIAGNOSIS — N2581 Secondary hyperparathyroidism of renal origin: Secondary | ICD-10-CM | POA: Diagnosis not present

## 2020-05-02 DIAGNOSIS — N186 End stage renal disease: Secondary | ICD-10-CM | POA: Diagnosis not present

## 2020-05-02 DIAGNOSIS — D631 Anemia in chronic kidney disease: Secondary | ICD-10-CM | POA: Diagnosis not present

## 2020-05-03 DIAGNOSIS — N186 End stage renal disease: Secondary | ICD-10-CM | POA: Diagnosis not present

## 2020-05-03 DIAGNOSIS — N2581 Secondary hyperparathyroidism of renal origin: Secondary | ICD-10-CM | POA: Diagnosis not present

## 2020-05-03 DIAGNOSIS — D631 Anemia in chronic kidney disease: Secondary | ICD-10-CM | POA: Diagnosis not present

## 2020-05-03 DIAGNOSIS — Z4932 Encounter for adequacy testing for peritoneal dialysis: Secondary | ICD-10-CM | POA: Diagnosis not present

## 2020-05-04 DIAGNOSIS — D631 Anemia in chronic kidney disease: Secondary | ICD-10-CM | POA: Diagnosis not present

## 2020-05-04 DIAGNOSIS — N186 End stage renal disease: Secondary | ICD-10-CM | POA: Diagnosis not present

## 2020-05-04 DIAGNOSIS — N2581 Secondary hyperparathyroidism of renal origin: Secondary | ICD-10-CM | POA: Diagnosis not present

## 2020-05-05 DIAGNOSIS — N2581 Secondary hyperparathyroidism of renal origin: Secondary | ICD-10-CM | POA: Diagnosis not present

## 2020-05-05 DIAGNOSIS — N186 End stage renal disease: Secondary | ICD-10-CM | POA: Diagnosis not present

## 2020-05-05 DIAGNOSIS — D631 Anemia in chronic kidney disease: Secondary | ICD-10-CM | POA: Diagnosis not present

## 2020-05-06 DIAGNOSIS — D631 Anemia in chronic kidney disease: Secondary | ICD-10-CM | POA: Diagnosis not present

## 2020-05-06 DIAGNOSIS — N2581 Secondary hyperparathyroidism of renal origin: Secondary | ICD-10-CM | POA: Diagnosis not present

## 2020-05-06 DIAGNOSIS — N186 End stage renal disease: Secondary | ICD-10-CM | POA: Diagnosis not present

## 2020-05-07 DIAGNOSIS — N2581 Secondary hyperparathyroidism of renal origin: Secondary | ICD-10-CM | POA: Diagnosis not present

## 2020-05-07 DIAGNOSIS — N186 End stage renal disease: Secondary | ICD-10-CM | POA: Diagnosis not present

## 2020-05-07 DIAGNOSIS — D631 Anemia in chronic kidney disease: Secondary | ICD-10-CM | POA: Diagnosis not present

## 2020-05-08 DIAGNOSIS — D631 Anemia in chronic kidney disease: Secondary | ICD-10-CM | POA: Diagnosis not present

## 2020-05-08 DIAGNOSIS — N186 End stage renal disease: Secondary | ICD-10-CM | POA: Diagnosis not present

## 2020-05-08 DIAGNOSIS — N2581 Secondary hyperparathyroidism of renal origin: Secondary | ICD-10-CM | POA: Diagnosis not present

## 2020-05-09 DIAGNOSIS — N2581 Secondary hyperparathyroidism of renal origin: Secondary | ICD-10-CM | POA: Diagnosis not present

## 2020-05-09 DIAGNOSIS — D631 Anemia in chronic kidney disease: Secondary | ICD-10-CM | POA: Diagnosis not present

## 2020-05-09 DIAGNOSIS — N186 End stage renal disease: Secondary | ICD-10-CM | POA: Diagnosis not present

## 2020-05-10 DIAGNOSIS — N186 End stage renal disease: Secondary | ICD-10-CM | POA: Diagnosis not present

## 2020-05-10 DIAGNOSIS — D631 Anemia in chronic kidney disease: Secondary | ICD-10-CM | POA: Diagnosis not present

## 2020-05-10 DIAGNOSIS — N2581 Secondary hyperparathyroidism of renal origin: Secondary | ICD-10-CM | POA: Diagnosis not present

## 2020-05-11 DIAGNOSIS — N2581 Secondary hyperparathyroidism of renal origin: Secondary | ICD-10-CM | POA: Diagnosis not present

## 2020-05-11 DIAGNOSIS — N186 End stage renal disease: Secondary | ICD-10-CM | POA: Diagnosis not present

## 2020-05-11 DIAGNOSIS — D631 Anemia in chronic kidney disease: Secondary | ICD-10-CM | POA: Diagnosis not present

## 2020-05-12 DIAGNOSIS — N2581 Secondary hyperparathyroidism of renal origin: Secondary | ICD-10-CM | POA: Diagnosis not present

## 2020-05-12 DIAGNOSIS — N186 End stage renal disease: Secondary | ICD-10-CM | POA: Diagnosis not present

## 2020-05-12 DIAGNOSIS — D631 Anemia in chronic kidney disease: Secondary | ICD-10-CM | POA: Diagnosis not present

## 2020-05-13 DIAGNOSIS — D631 Anemia in chronic kidney disease: Secondary | ICD-10-CM | POA: Diagnosis not present

## 2020-05-13 DIAGNOSIS — N186 End stage renal disease: Secondary | ICD-10-CM | POA: Diagnosis not present

## 2020-05-13 DIAGNOSIS — N2581 Secondary hyperparathyroidism of renal origin: Secondary | ICD-10-CM | POA: Diagnosis not present

## 2020-05-14 DIAGNOSIS — D631 Anemia in chronic kidney disease: Secondary | ICD-10-CM | POA: Diagnosis not present

## 2020-05-14 DIAGNOSIS — N2581 Secondary hyperparathyroidism of renal origin: Secondary | ICD-10-CM | POA: Diagnosis not present

## 2020-05-14 DIAGNOSIS — N186 End stage renal disease: Secondary | ICD-10-CM | POA: Diagnosis not present

## 2020-05-15 DIAGNOSIS — N186 End stage renal disease: Secondary | ICD-10-CM | POA: Diagnosis not present

## 2020-05-15 DIAGNOSIS — N2581 Secondary hyperparathyroidism of renal origin: Secondary | ICD-10-CM | POA: Diagnosis not present

## 2020-05-15 DIAGNOSIS — D631 Anemia in chronic kidney disease: Secondary | ICD-10-CM | POA: Diagnosis not present

## 2020-05-16 DIAGNOSIS — D631 Anemia in chronic kidney disease: Secondary | ICD-10-CM | POA: Diagnosis not present

## 2020-05-16 DIAGNOSIS — N2581 Secondary hyperparathyroidism of renal origin: Secondary | ICD-10-CM | POA: Diagnosis not present

## 2020-05-16 DIAGNOSIS — N186 End stage renal disease: Secondary | ICD-10-CM | POA: Diagnosis not present

## 2020-05-17 DIAGNOSIS — N2581 Secondary hyperparathyroidism of renal origin: Secondary | ICD-10-CM | POA: Diagnosis not present

## 2020-05-17 DIAGNOSIS — D631 Anemia in chronic kidney disease: Secondary | ICD-10-CM | POA: Diagnosis not present

## 2020-05-17 DIAGNOSIS — N186 End stage renal disease: Secondary | ICD-10-CM | POA: Diagnosis not present

## 2020-05-18 DIAGNOSIS — N186 End stage renal disease: Secondary | ICD-10-CM | POA: Diagnosis not present

## 2020-05-18 DIAGNOSIS — N2581 Secondary hyperparathyroidism of renal origin: Secondary | ICD-10-CM | POA: Diagnosis not present

## 2020-05-18 DIAGNOSIS — D631 Anemia in chronic kidney disease: Secondary | ICD-10-CM | POA: Diagnosis not present

## 2020-05-19 DIAGNOSIS — N186 End stage renal disease: Secondary | ICD-10-CM | POA: Diagnosis not present

## 2020-05-19 DIAGNOSIS — N2581 Secondary hyperparathyroidism of renal origin: Secondary | ICD-10-CM | POA: Diagnosis not present

## 2020-05-19 DIAGNOSIS — D631 Anemia in chronic kidney disease: Secondary | ICD-10-CM | POA: Diagnosis not present

## 2020-05-20 DIAGNOSIS — D631 Anemia in chronic kidney disease: Secondary | ICD-10-CM | POA: Diagnosis not present

## 2020-05-20 DIAGNOSIS — N186 End stage renal disease: Secondary | ICD-10-CM | POA: Diagnosis not present

## 2020-05-20 DIAGNOSIS — N2581 Secondary hyperparathyroidism of renal origin: Secondary | ICD-10-CM | POA: Diagnosis not present

## 2020-05-21 DIAGNOSIS — D631 Anemia in chronic kidney disease: Secondary | ICD-10-CM | POA: Diagnosis not present

## 2020-05-21 DIAGNOSIS — N2581 Secondary hyperparathyroidism of renal origin: Secondary | ICD-10-CM | POA: Diagnosis not present

## 2020-05-21 DIAGNOSIS — N186 End stage renal disease: Secondary | ICD-10-CM | POA: Diagnosis not present

## 2020-05-22 DIAGNOSIS — N186 End stage renal disease: Secondary | ICD-10-CM | POA: Diagnosis not present

## 2020-05-22 DIAGNOSIS — D631 Anemia in chronic kidney disease: Secondary | ICD-10-CM | POA: Diagnosis not present

## 2020-05-22 DIAGNOSIS — Z992 Dependence on renal dialysis: Secondary | ICD-10-CM | POA: Diagnosis not present

## 2020-05-22 DIAGNOSIS — N2581 Secondary hyperparathyroidism of renal origin: Secondary | ICD-10-CM | POA: Diagnosis not present

## 2020-05-23 DIAGNOSIS — N186 End stage renal disease: Secondary | ICD-10-CM | POA: Diagnosis not present

## 2020-05-23 DIAGNOSIS — N2581 Secondary hyperparathyroidism of renal origin: Secondary | ICD-10-CM | POA: Diagnosis not present

## 2020-05-23 DIAGNOSIS — D631 Anemia in chronic kidney disease: Secondary | ICD-10-CM | POA: Diagnosis not present

## 2020-05-24 DIAGNOSIS — N186 End stage renal disease: Secondary | ICD-10-CM | POA: Diagnosis not present

## 2020-05-24 DIAGNOSIS — D631 Anemia in chronic kidney disease: Secondary | ICD-10-CM | POA: Diagnosis not present

## 2020-05-24 DIAGNOSIS — N2581 Secondary hyperparathyroidism of renal origin: Secondary | ICD-10-CM | POA: Diagnosis not present

## 2020-05-25 DIAGNOSIS — N2581 Secondary hyperparathyroidism of renal origin: Secondary | ICD-10-CM | POA: Diagnosis not present

## 2020-05-25 DIAGNOSIS — N186 End stage renal disease: Secondary | ICD-10-CM | POA: Diagnosis not present

## 2020-05-25 DIAGNOSIS — D631 Anemia in chronic kidney disease: Secondary | ICD-10-CM | POA: Diagnosis not present

## 2020-05-26 DIAGNOSIS — N2581 Secondary hyperparathyroidism of renal origin: Secondary | ICD-10-CM | POA: Diagnosis not present

## 2020-05-26 DIAGNOSIS — D631 Anemia in chronic kidney disease: Secondary | ICD-10-CM | POA: Diagnosis not present

## 2020-05-26 DIAGNOSIS — N186 End stage renal disease: Secondary | ICD-10-CM | POA: Diagnosis not present

## 2020-05-27 DIAGNOSIS — D631 Anemia in chronic kidney disease: Secondary | ICD-10-CM | POA: Diagnosis not present

## 2020-05-27 DIAGNOSIS — N186 End stage renal disease: Secondary | ICD-10-CM | POA: Diagnosis not present

## 2020-05-27 DIAGNOSIS — N2581 Secondary hyperparathyroidism of renal origin: Secondary | ICD-10-CM | POA: Diagnosis not present

## 2020-05-28 DIAGNOSIS — D631 Anemia in chronic kidney disease: Secondary | ICD-10-CM | POA: Diagnosis not present

## 2020-05-28 DIAGNOSIS — N2581 Secondary hyperparathyroidism of renal origin: Secondary | ICD-10-CM | POA: Diagnosis not present

## 2020-05-28 DIAGNOSIS — N186 End stage renal disease: Secondary | ICD-10-CM | POA: Diagnosis not present

## 2020-05-29 DIAGNOSIS — D631 Anemia in chronic kidney disease: Secondary | ICD-10-CM | POA: Diagnosis not present

## 2020-05-29 DIAGNOSIS — N186 End stage renal disease: Secondary | ICD-10-CM | POA: Diagnosis not present

## 2020-05-29 DIAGNOSIS — N2581 Secondary hyperparathyroidism of renal origin: Secondary | ICD-10-CM | POA: Diagnosis not present

## 2020-05-30 DIAGNOSIS — N2581 Secondary hyperparathyroidism of renal origin: Secondary | ICD-10-CM | POA: Diagnosis not present

## 2020-05-30 DIAGNOSIS — N186 End stage renal disease: Secondary | ICD-10-CM | POA: Diagnosis not present

## 2020-05-30 DIAGNOSIS — D631 Anemia in chronic kidney disease: Secondary | ICD-10-CM | POA: Diagnosis not present

## 2020-05-31 DIAGNOSIS — N186 End stage renal disease: Secondary | ICD-10-CM | POA: Diagnosis not present

## 2020-05-31 DIAGNOSIS — N2581 Secondary hyperparathyroidism of renal origin: Secondary | ICD-10-CM | POA: Diagnosis not present

## 2020-05-31 DIAGNOSIS — D631 Anemia in chronic kidney disease: Secondary | ICD-10-CM | POA: Diagnosis not present

## 2020-06-01 DIAGNOSIS — N2581 Secondary hyperparathyroidism of renal origin: Secondary | ICD-10-CM | POA: Diagnosis not present

## 2020-06-01 DIAGNOSIS — D631 Anemia in chronic kidney disease: Secondary | ICD-10-CM | POA: Diagnosis not present

## 2020-06-01 DIAGNOSIS — N186 End stage renal disease: Secondary | ICD-10-CM | POA: Diagnosis not present

## 2020-06-02 DIAGNOSIS — D631 Anemia in chronic kidney disease: Secondary | ICD-10-CM | POA: Diagnosis not present

## 2020-06-02 DIAGNOSIS — N2581 Secondary hyperparathyroidism of renal origin: Secondary | ICD-10-CM | POA: Diagnosis not present

## 2020-06-02 DIAGNOSIS — N186 End stage renal disease: Secondary | ICD-10-CM | POA: Diagnosis not present

## 2020-06-03 DIAGNOSIS — N186 End stage renal disease: Secondary | ICD-10-CM | POA: Diagnosis not present

## 2020-06-03 DIAGNOSIS — N2581 Secondary hyperparathyroidism of renal origin: Secondary | ICD-10-CM | POA: Diagnosis not present

## 2020-06-03 DIAGNOSIS — D631 Anemia in chronic kidney disease: Secondary | ICD-10-CM | POA: Diagnosis not present

## 2020-06-04 DIAGNOSIS — D631 Anemia in chronic kidney disease: Secondary | ICD-10-CM | POA: Diagnosis not present

## 2020-06-04 DIAGNOSIS — N186 End stage renal disease: Secondary | ICD-10-CM | POA: Diagnosis not present

## 2020-06-04 DIAGNOSIS — N2581 Secondary hyperparathyroidism of renal origin: Secondary | ICD-10-CM | POA: Diagnosis not present

## 2020-06-05 DIAGNOSIS — N2581 Secondary hyperparathyroidism of renal origin: Secondary | ICD-10-CM | POA: Diagnosis not present

## 2020-06-05 DIAGNOSIS — D631 Anemia in chronic kidney disease: Secondary | ICD-10-CM | POA: Diagnosis not present

## 2020-06-05 DIAGNOSIS — N186 End stage renal disease: Secondary | ICD-10-CM | POA: Diagnosis not present

## 2020-06-06 DIAGNOSIS — N2581 Secondary hyperparathyroidism of renal origin: Secondary | ICD-10-CM | POA: Diagnosis not present

## 2020-06-06 DIAGNOSIS — D631 Anemia in chronic kidney disease: Secondary | ICD-10-CM | POA: Diagnosis not present

## 2020-06-06 DIAGNOSIS — N186 End stage renal disease: Secondary | ICD-10-CM | POA: Diagnosis not present

## 2020-06-07 DIAGNOSIS — N186 End stage renal disease: Secondary | ICD-10-CM | POA: Diagnosis not present

## 2020-06-07 DIAGNOSIS — N2581 Secondary hyperparathyroidism of renal origin: Secondary | ICD-10-CM | POA: Diagnosis not present

## 2020-06-07 DIAGNOSIS — D631 Anemia in chronic kidney disease: Secondary | ICD-10-CM | POA: Diagnosis not present

## 2020-06-08 DIAGNOSIS — D631 Anemia in chronic kidney disease: Secondary | ICD-10-CM | POA: Diagnosis not present

## 2020-06-08 DIAGNOSIS — N2581 Secondary hyperparathyroidism of renal origin: Secondary | ICD-10-CM | POA: Diagnosis not present

## 2020-06-08 DIAGNOSIS — N186 End stage renal disease: Secondary | ICD-10-CM | POA: Diagnosis not present

## 2020-06-09 DIAGNOSIS — N186 End stage renal disease: Secondary | ICD-10-CM | POA: Diagnosis not present

## 2020-06-09 DIAGNOSIS — D631 Anemia in chronic kidney disease: Secondary | ICD-10-CM | POA: Diagnosis not present

## 2020-06-09 DIAGNOSIS — N2581 Secondary hyperparathyroidism of renal origin: Secondary | ICD-10-CM | POA: Diagnosis not present

## 2020-06-10 DIAGNOSIS — F319 Bipolar disorder, unspecified: Secondary | ICD-10-CM | POA: Diagnosis not present

## 2020-06-10 DIAGNOSIS — N186 End stage renal disease: Secondary | ICD-10-CM | POA: Diagnosis not present

## 2020-06-10 DIAGNOSIS — N2581 Secondary hyperparathyroidism of renal origin: Secondary | ICD-10-CM | POA: Diagnosis not present

## 2020-06-10 DIAGNOSIS — F29 Unspecified psychosis not due to a substance or known physiological condition: Secondary | ICD-10-CM | POA: Diagnosis not present

## 2020-06-10 DIAGNOSIS — F250 Schizoaffective disorder, bipolar type: Secondary | ICD-10-CM | POA: Diagnosis not present

## 2020-06-10 DIAGNOSIS — F71 Moderate intellectual disabilities: Secondary | ICD-10-CM | POA: Diagnosis not present

## 2020-06-10 DIAGNOSIS — D631 Anemia in chronic kidney disease: Secondary | ICD-10-CM | POA: Diagnosis not present

## 2020-06-11 DIAGNOSIS — N186 End stage renal disease: Secondary | ICD-10-CM | POA: Diagnosis not present

## 2020-06-11 DIAGNOSIS — D631 Anemia in chronic kidney disease: Secondary | ICD-10-CM | POA: Diagnosis not present

## 2020-06-11 DIAGNOSIS — N2581 Secondary hyperparathyroidism of renal origin: Secondary | ICD-10-CM | POA: Diagnosis not present

## 2020-06-12 DIAGNOSIS — N186 End stage renal disease: Secondary | ICD-10-CM | POA: Diagnosis not present

## 2020-06-12 DIAGNOSIS — D631 Anemia in chronic kidney disease: Secondary | ICD-10-CM | POA: Diagnosis not present

## 2020-06-12 DIAGNOSIS — N2581 Secondary hyperparathyroidism of renal origin: Secondary | ICD-10-CM | POA: Diagnosis not present

## 2020-06-13 DIAGNOSIS — N186 End stage renal disease: Secondary | ICD-10-CM | POA: Diagnosis not present

## 2020-06-13 DIAGNOSIS — D631 Anemia in chronic kidney disease: Secondary | ICD-10-CM | POA: Diagnosis not present

## 2020-06-13 DIAGNOSIS — N2581 Secondary hyperparathyroidism of renal origin: Secondary | ICD-10-CM | POA: Diagnosis not present

## 2020-06-14 DIAGNOSIS — N186 End stage renal disease: Secondary | ICD-10-CM | POA: Diagnosis not present

## 2020-06-14 DIAGNOSIS — N2581 Secondary hyperparathyroidism of renal origin: Secondary | ICD-10-CM | POA: Diagnosis not present

## 2020-06-14 DIAGNOSIS — D631 Anemia in chronic kidney disease: Secondary | ICD-10-CM | POA: Diagnosis not present

## 2020-06-15 DIAGNOSIS — N186 End stage renal disease: Secondary | ICD-10-CM | POA: Diagnosis not present

## 2020-06-15 DIAGNOSIS — N2581 Secondary hyperparathyroidism of renal origin: Secondary | ICD-10-CM | POA: Diagnosis not present

## 2020-06-15 DIAGNOSIS — D631 Anemia in chronic kidney disease: Secondary | ICD-10-CM | POA: Diagnosis not present

## 2020-06-16 DIAGNOSIS — N186 End stage renal disease: Secondary | ICD-10-CM | POA: Diagnosis not present

## 2020-06-16 DIAGNOSIS — D631 Anemia in chronic kidney disease: Secondary | ICD-10-CM | POA: Diagnosis not present

## 2020-06-16 DIAGNOSIS — N2581 Secondary hyperparathyroidism of renal origin: Secondary | ICD-10-CM | POA: Diagnosis not present

## 2020-06-17 DIAGNOSIS — N2581 Secondary hyperparathyroidism of renal origin: Secondary | ICD-10-CM | POA: Diagnosis not present

## 2020-06-17 DIAGNOSIS — N186 End stage renal disease: Secondary | ICD-10-CM | POA: Diagnosis not present

## 2020-06-17 DIAGNOSIS — D631 Anemia in chronic kidney disease: Secondary | ICD-10-CM | POA: Diagnosis not present

## 2020-06-18 DIAGNOSIS — N2581 Secondary hyperparathyroidism of renal origin: Secondary | ICD-10-CM | POA: Diagnosis not present

## 2020-06-18 DIAGNOSIS — D631 Anemia in chronic kidney disease: Secondary | ICD-10-CM | POA: Diagnosis not present

## 2020-06-18 DIAGNOSIS — N186 End stage renal disease: Secondary | ICD-10-CM | POA: Diagnosis not present

## 2020-06-19 DIAGNOSIS — N2581 Secondary hyperparathyroidism of renal origin: Secondary | ICD-10-CM | POA: Diagnosis not present

## 2020-06-19 DIAGNOSIS — D631 Anemia in chronic kidney disease: Secondary | ICD-10-CM | POA: Diagnosis not present

## 2020-06-19 DIAGNOSIS — N186 End stage renal disease: Secondary | ICD-10-CM | POA: Diagnosis not present

## 2020-06-20 DIAGNOSIS — N2581 Secondary hyperparathyroidism of renal origin: Secondary | ICD-10-CM | POA: Diagnosis not present

## 2020-06-20 DIAGNOSIS — N186 End stage renal disease: Secondary | ICD-10-CM | POA: Diagnosis not present

## 2020-06-20 DIAGNOSIS — D631 Anemia in chronic kidney disease: Secondary | ICD-10-CM | POA: Diagnosis not present

## 2020-06-21 DIAGNOSIS — N2581 Secondary hyperparathyroidism of renal origin: Secondary | ICD-10-CM | POA: Diagnosis not present

## 2020-06-21 DIAGNOSIS — N186 End stage renal disease: Secondary | ICD-10-CM | POA: Diagnosis not present

## 2020-06-21 DIAGNOSIS — D631 Anemia in chronic kidney disease: Secondary | ICD-10-CM | POA: Diagnosis not present

## 2020-06-22 DIAGNOSIS — N186 End stage renal disease: Secondary | ICD-10-CM | POA: Diagnosis not present

## 2020-06-22 DIAGNOSIS — N2581 Secondary hyperparathyroidism of renal origin: Secondary | ICD-10-CM | POA: Diagnosis not present

## 2020-06-22 DIAGNOSIS — D631 Anemia in chronic kidney disease: Secondary | ICD-10-CM | POA: Diagnosis not present

## 2020-06-22 DIAGNOSIS — Z992 Dependence on renal dialysis: Secondary | ICD-10-CM | POA: Diagnosis not present

## 2020-06-23 DIAGNOSIS — D631 Anemia in chronic kidney disease: Secondary | ICD-10-CM | POA: Diagnosis not present

## 2020-06-23 DIAGNOSIS — N186 End stage renal disease: Secondary | ICD-10-CM | POA: Diagnosis not present

## 2020-06-23 DIAGNOSIS — N2581 Secondary hyperparathyroidism of renal origin: Secondary | ICD-10-CM | POA: Diagnosis not present

## 2020-06-24 DIAGNOSIS — N2581 Secondary hyperparathyroidism of renal origin: Secondary | ICD-10-CM | POA: Diagnosis not present

## 2020-06-24 DIAGNOSIS — N186 End stage renal disease: Secondary | ICD-10-CM | POA: Diagnosis not present

## 2020-06-24 DIAGNOSIS — D631 Anemia in chronic kidney disease: Secondary | ICD-10-CM | POA: Diagnosis not present

## 2020-06-25 DIAGNOSIS — N2581 Secondary hyperparathyroidism of renal origin: Secondary | ICD-10-CM | POA: Diagnosis not present

## 2020-06-25 DIAGNOSIS — N186 End stage renal disease: Secondary | ICD-10-CM | POA: Diagnosis not present

## 2020-06-25 DIAGNOSIS — D631 Anemia in chronic kidney disease: Secondary | ICD-10-CM | POA: Diagnosis not present

## 2020-06-26 DIAGNOSIS — N2581 Secondary hyperparathyroidism of renal origin: Secondary | ICD-10-CM | POA: Diagnosis not present

## 2020-06-26 DIAGNOSIS — N186 End stage renal disease: Secondary | ICD-10-CM | POA: Diagnosis not present

## 2020-06-26 DIAGNOSIS — D631 Anemia in chronic kidney disease: Secondary | ICD-10-CM | POA: Diagnosis not present

## 2020-06-27 DIAGNOSIS — N2581 Secondary hyperparathyroidism of renal origin: Secondary | ICD-10-CM | POA: Diagnosis not present

## 2020-06-27 DIAGNOSIS — D631 Anemia in chronic kidney disease: Secondary | ICD-10-CM | POA: Diagnosis not present

## 2020-06-27 DIAGNOSIS — N186 End stage renal disease: Secondary | ICD-10-CM | POA: Diagnosis not present

## 2020-06-28 DIAGNOSIS — N2581 Secondary hyperparathyroidism of renal origin: Secondary | ICD-10-CM | POA: Diagnosis not present

## 2020-06-28 DIAGNOSIS — D631 Anemia in chronic kidney disease: Secondary | ICD-10-CM | POA: Diagnosis not present

## 2020-06-28 DIAGNOSIS — N186 End stage renal disease: Secondary | ICD-10-CM | POA: Diagnosis not present

## 2020-06-29 DIAGNOSIS — N2581 Secondary hyperparathyroidism of renal origin: Secondary | ICD-10-CM | POA: Diagnosis not present

## 2020-06-29 DIAGNOSIS — N186 End stage renal disease: Secondary | ICD-10-CM | POA: Diagnosis not present

## 2020-06-29 DIAGNOSIS — D631 Anemia in chronic kidney disease: Secondary | ICD-10-CM | POA: Diagnosis not present

## 2020-06-30 DIAGNOSIS — N186 End stage renal disease: Secondary | ICD-10-CM | POA: Diagnosis not present

## 2020-06-30 DIAGNOSIS — N2581 Secondary hyperparathyroidism of renal origin: Secondary | ICD-10-CM | POA: Diagnosis not present

## 2020-06-30 DIAGNOSIS — D631 Anemia in chronic kidney disease: Secondary | ICD-10-CM | POA: Diagnosis not present

## 2020-07-01 DIAGNOSIS — N186 End stage renal disease: Secondary | ICD-10-CM | POA: Diagnosis not present

## 2020-07-01 DIAGNOSIS — D631 Anemia in chronic kidney disease: Secondary | ICD-10-CM | POA: Diagnosis not present

## 2020-07-01 DIAGNOSIS — N2581 Secondary hyperparathyroidism of renal origin: Secondary | ICD-10-CM | POA: Diagnosis not present

## 2020-07-02 DIAGNOSIS — N2581 Secondary hyperparathyroidism of renal origin: Secondary | ICD-10-CM | POA: Diagnosis not present

## 2020-07-02 DIAGNOSIS — D631 Anemia in chronic kidney disease: Secondary | ICD-10-CM | POA: Diagnosis not present

## 2020-07-02 DIAGNOSIS — N186 End stage renal disease: Secondary | ICD-10-CM | POA: Diagnosis not present

## 2020-07-03 DIAGNOSIS — N2581 Secondary hyperparathyroidism of renal origin: Secondary | ICD-10-CM | POA: Diagnosis not present

## 2020-07-03 DIAGNOSIS — D631 Anemia in chronic kidney disease: Secondary | ICD-10-CM | POA: Diagnosis not present

## 2020-07-03 DIAGNOSIS — N186 End stage renal disease: Secondary | ICD-10-CM | POA: Diagnosis not present

## 2020-07-04 DIAGNOSIS — N2581 Secondary hyperparathyroidism of renal origin: Secondary | ICD-10-CM | POA: Diagnosis not present

## 2020-07-04 DIAGNOSIS — N186 End stage renal disease: Secondary | ICD-10-CM | POA: Diagnosis not present

## 2020-07-04 DIAGNOSIS — D631 Anemia in chronic kidney disease: Secondary | ICD-10-CM | POA: Diagnosis not present

## 2020-07-05 DIAGNOSIS — N2581 Secondary hyperparathyroidism of renal origin: Secondary | ICD-10-CM | POA: Diagnosis not present

## 2020-07-05 DIAGNOSIS — N186 End stage renal disease: Secondary | ICD-10-CM | POA: Diagnosis not present

## 2020-07-05 DIAGNOSIS — D631 Anemia in chronic kidney disease: Secondary | ICD-10-CM | POA: Diagnosis not present

## 2020-07-06 DIAGNOSIS — D631 Anemia in chronic kidney disease: Secondary | ICD-10-CM | POA: Diagnosis not present

## 2020-07-06 DIAGNOSIS — N2581 Secondary hyperparathyroidism of renal origin: Secondary | ICD-10-CM | POA: Diagnosis not present

## 2020-07-06 DIAGNOSIS — N186 End stage renal disease: Secondary | ICD-10-CM | POA: Diagnosis not present

## 2020-07-07 DIAGNOSIS — N186 End stage renal disease: Secondary | ICD-10-CM | POA: Diagnosis not present

## 2020-07-07 DIAGNOSIS — N2581 Secondary hyperparathyroidism of renal origin: Secondary | ICD-10-CM | POA: Diagnosis not present

## 2020-07-07 DIAGNOSIS — D631 Anemia in chronic kidney disease: Secondary | ICD-10-CM | POA: Diagnosis not present

## 2020-07-08 DIAGNOSIS — N186 End stage renal disease: Secondary | ICD-10-CM | POA: Diagnosis not present

## 2020-07-08 DIAGNOSIS — N2581 Secondary hyperparathyroidism of renal origin: Secondary | ICD-10-CM | POA: Diagnosis not present

## 2020-07-08 DIAGNOSIS — D631 Anemia in chronic kidney disease: Secondary | ICD-10-CM | POA: Diagnosis not present

## 2020-07-09 DIAGNOSIS — N186 End stage renal disease: Secondary | ICD-10-CM | POA: Diagnosis not present

## 2020-07-09 DIAGNOSIS — N2581 Secondary hyperparathyroidism of renal origin: Secondary | ICD-10-CM | POA: Diagnosis not present

## 2020-07-09 DIAGNOSIS — D631 Anemia in chronic kidney disease: Secondary | ICD-10-CM | POA: Diagnosis not present

## 2020-07-10 DIAGNOSIS — N2581 Secondary hyperparathyroidism of renal origin: Secondary | ICD-10-CM | POA: Diagnosis not present

## 2020-07-10 DIAGNOSIS — N186 End stage renal disease: Secondary | ICD-10-CM | POA: Diagnosis not present

## 2020-07-10 DIAGNOSIS — D631 Anemia in chronic kidney disease: Secondary | ICD-10-CM | POA: Diagnosis not present

## 2020-07-11 DIAGNOSIS — D631 Anemia in chronic kidney disease: Secondary | ICD-10-CM | POA: Diagnosis not present

## 2020-07-11 DIAGNOSIS — N186 End stage renal disease: Secondary | ICD-10-CM | POA: Diagnosis not present

## 2020-07-11 DIAGNOSIS — N2581 Secondary hyperparathyroidism of renal origin: Secondary | ICD-10-CM | POA: Diagnosis not present

## 2020-07-12 DIAGNOSIS — K219 Gastro-esophageal reflux disease without esophagitis: Secondary | ICD-10-CM | POA: Diagnosis not present

## 2020-07-12 DIAGNOSIS — N926 Irregular menstruation, unspecified: Secondary | ICD-10-CM | POA: Diagnosis not present

## 2020-07-12 DIAGNOSIS — N2581 Secondary hyperparathyroidism of renal origin: Secondary | ICD-10-CM | POA: Diagnosis not present

## 2020-07-12 DIAGNOSIS — R110 Nausea: Secondary | ICD-10-CM | POA: Diagnosis not present

## 2020-07-12 DIAGNOSIS — Z131 Encounter for screening for diabetes mellitus: Secondary | ICD-10-CM | POA: Diagnosis not present

## 2020-07-12 DIAGNOSIS — E039 Hypothyroidism, unspecified: Secondary | ICD-10-CM | POA: Diagnosis not present

## 2020-07-12 DIAGNOSIS — R569 Unspecified convulsions: Secondary | ICD-10-CM | POA: Diagnosis not present

## 2020-07-12 DIAGNOSIS — E782 Mixed hyperlipidemia: Secondary | ICD-10-CM | POA: Diagnosis not present

## 2020-07-12 DIAGNOSIS — D631 Anemia in chronic kidney disease: Secondary | ICD-10-CM | POA: Diagnosis not present

## 2020-07-12 DIAGNOSIS — D539 Nutritional anemia, unspecified: Secondary | ICD-10-CM | POA: Diagnosis not present

## 2020-07-12 DIAGNOSIS — N186 End stage renal disease: Secondary | ICD-10-CM | POA: Diagnosis not present

## 2020-07-12 DIAGNOSIS — Z3042 Encounter for surveillance of injectable contraceptive: Secondary | ICD-10-CM | POA: Diagnosis not present

## 2020-07-13 DIAGNOSIS — N186 End stage renal disease: Secondary | ICD-10-CM | POA: Diagnosis not present

## 2020-07-13 DIAGNOSIS — N2581 Secondary hyperparathyroidism of renal origin: Secondary | ICD-10-CM | POA: Diagnosis not present

## 2020-07-13 DIAGNOSIS — D631 Anemia in chronic kidney disease: Secondary | ICD-10-CM | POA: Diagnosis not present

## 2020-07-14 DIAGNOSIS — D631 Anemia in chronic kidney disease: Secondary | ICD-10-CM | POA: Diagnosis not present

## 2020-07-14 DIAGNOSIS — N186 End stage renal disease: Secondary | ICD-10-CM | POA: Diagnosis not present

## 2020-07-14 DIAGNOSIS — N2581 Secondary hyperparathyroidism of renal origin: Secondary | ICD-10-CM | POA: Diagnosis not present

## 2020-07-15 DIAGNOSIS — D631 Anemia in chronic kidney disease: Secondary | ICD-10-CM | POA: Diagnosis not present

## 2020-07-15 DIAGNOSIS — N186 End stage renal disease: Secondary | ICD-10-CM | POA: Diagnosis not present

## 2020-07-15 DIAGNOSIS — N2581 Secondary hyperparathyroidism of renal origin: Secondary | ICD-10-CM | POA: Diagnosis not present

## 2020-07-16 DIAGNOSIS — N2581 Secondary hyperparathyroidism of renal origin: Secondary | ICD-10-CM | POA: Diagnosis not present

## 2020-07-16 DIAGNOSIS — D631 Anemia in chronic kidney disease: Secondary | ICD-10-CM | POA: Diagnosis not present

## 2020-07-16 DIAGNOSIS — N186 End stage renal disease: Secondary | ICD-10-CM | POA: Diagnosis not present

## 2020-07-17 DIAGNOSIS — N2581 Secondary hyperparathyroidism of renal origin: Secondary | ICD-10-CM | POA: Diagnosis not present

## 2020-07-17 DIAGNOSIS — N186 End stage renal disease: Secondary | ICD-10-CM | POA: Diagnosis not present

## 2020-07-17 DIAGNOSIS — D631 Anemia in chronic kidney disease: Secondary | ICD-10-CM | POA: Diagnosis not present

## 2020-07-18 DIAGNOSIS — N2581 Secondary hyperparathyroidism of renal origin: Secondary | ICD-10-CM | POA: Diagnosis not present

## 2020-07-18 DIAGNOSIS — N186 End stage renal disease: Secondary | ICD-10-CM | POA: Diagnosis not present

## 2020-07-18 DIAGNOSIS — D631 Anemia in chronic kidney disease: Secondary | ICD-10-CM | POA: Diagnosis not present

## 2020-07-19 DIAGNOSIS — D631 Anemia in chronic kidney disease: Secondary | ICD-10-CM | POA: Diagnosis not present

## 2020-07-19 DIAGNOSIS — N186 End stage renal disease: Secondary | ICD-10-CM | POA: Diagnosis not present

## 2020-07-19 DIAGNOSIS — N2581 Secondary hyperparathyroidism of renal origin: Secondary | ICD-10-CM | POA: Diagnosis not present

## 2020-07-20 DIAGNOSIS — N186 End stage renal disease: Secondary | ICD-10-CM | POA: Diagnosis not present

## 2020-07-20 DIAGNOSIS — N2581 Secondary hyperparathyroidism of renal origin: Secondary | ICD-10-CM | POA: Diagnosis not present

## 2020-07-20 DIAGNOSIS — D631 Anemia in chronic kidney disease: Secondary | ICD-10-CM | POA: Diagnosis not present

## 2020-07-21 DIAGNOSIS — N2581 Secondary hyperparathyroidism of renal origin: Secondary | ICD-10-CM | POA: Diagnosis not present

## 2020-07-21 DIAGNOSIS — D631 Anemia in chronic kidney disease: Secondary | ICD-10-CM | POA: Diagnosis not present

## 2020-07-21 DIAGNOSIS — N186 End stage renal disease: Secondary | ICD-10-CM | POA: Diagnosis not present

## 2020-07-22 DIAGNOSIS — Z992 Dependence on renal dialysis: Secondary | ICD-10-CM | POA: Diagnosis not present

## 2020-07-22 DIAGNOSIS — N2581 Secondary hyperparathyroidism of renal origin: Secondary | ICD-10-CM | POA: Diagnosis not present

## 2020-07-22 DIAGNOSIS — N186 End stage renal disease: Secondary | ICD-10-CM | POA: Diagnosis not present

## 2020-07-22 DIAGNOSIS — D631 Anemia in chronic kidney disease: Secondary | ICD-10-CM | POA: Diagnosis not present

## 2020-07-23 DIAGNOSIS — N186 End stage renal disease: Secondary | ICD-10-CM | POA: Diagnosis not present

## 2020-07-23 DIAGNOSIS — N2581 Secondary hyperparathyroidism of renal origin: Secondary | ICD-10-CM | POA: Diagnosis not present

## 2020-07-23 DIAGNOSIS — D631 Anemia in chronic kidney disease: Secondary | ICD-10-CM | POA: Diagnosis not present

## 2020-07-24 DIAGNOSIS — N186 End stage renal disease: Secondary | ICD-10-CM | POA: Diagnosis not present

## 2020-07-24 DIAGNOSIS — N2581 Secondary hyperparathyroidism of renal origin: Secondary | ICD-10-CM | POA: Diagnosis not present

## 2020-07-24 DIAGNOSIS — D631 Anemia in chronic kidney disease: Secondary | ICD-10-CM | POA: Diagnosis not present

## 2020-07-25 DIAGNOSIS — D631 Anemia in chronic kidney disease: Secondary | ICD-10-CM | POA: Diagnosis not present

## 2020-07-25 DIAGNOSIS — N2581 Secondary hyperparathyroidism of renal origin: Secondary | ICD-10-CM | POA: Diagnosis not present

## 2020-07-25 DIAGNOSIS — N186 End stage renal disease: Secondary | ICD-10-CM | POA: Diagnosis not present

## 2020-07-26 DIAGNOSIS — Z4932 Encounter for adequacy testing for peritoneal dialysis: Secondary | ICD-10-CM | POA: Diagnosis not present

## 2020-07-26 DIAGNOSIS — N186 End stage renal disease: Secondary | ICD-10-CM | POA: Diagnosis not present

## 2020-07-26 DIAGNOSIS — N2581 Secondary hyperparathyroidism of renal origin: Secondary | ICD-10-CM | POA: Diagnosis not present

## 2020-07-26 DIAGNOSIS — D631 Anemia in chronic kidney disease: Secondary | ICD-10-CM | POA: Diagnosis not present

## 2020-07-27 DIAGNOSIS — N2581 Secondary hyperparathyroidism of renal origin: Secondary | ICD-10-CM | POA: Diagnosis not present

## 2020-07-27 DIAGNOSIS — D631 Anemia in chronic kidney disease: Secondary | ICD-10-CM | POA: Diagnosis not present

## 2020-07-27 DIAGNOSIS — N186 End stage renal disease: Secondary | ICD-10-CM | POA: Diagnosis not present

## 2020-07-28 DIAGNOSIS — D631 Anemia in chronic kidney disease: Secondary | ICD-10-CM | POA: Diagnosis not present

## 2020-07-28 DIAGNOSIS — N2581 Secondary hyperparathyroidism of renal origin: Secondary | ICD-10-CM | POA: Diagnosis not present

## 2020-07-28 DIAGNOSIS — N186 End stage renal disease: Secondary | ICD-10-CM | POA: Diagnosis not present

## 2020-07-29 DIAGNOSIS — N186 End stage renal disease: Secondary | ICD-10-CM | POA: Diagnosis not present

## 2020-07-29 DIAGNOSIS — D631 Anemia in chronic kidney disease: Secondary | ICD-10-CM | POA: Diagnosis not present

## 2020-07-29 DIAGNOSIS — N2581 Secondary hyperparathyroidism of renal origin: Secondary | ICD-10-CM | POA: Diagnosis not present

## 2020-07-30 DIAGNOSIS — N186 End stage renal disease: Secondary | ICD-10-CM | POA: Diagnosis not present

## 2020-07-30 DIAGNOSIS — N2581 Secondary hyperparathyroidism of renal origin: Secondary | ICD-10-CM | POA: Diagnosis not present

## 2020-07-30 DIAGNOSIS — D631 Anemia in chronic kidney disease: Secondary | ICD-10-CM | POA: Diagnosis not present

## 2020-07-31 DIAGNOSIS — N186 End stage renal disease: Secondary | ICD-10-CM | POA: Diagnosis not present

## 2020-07-31 DIAGNOSIS — N2581 Secondary hyperparathyroidism of renal origin: Secondary | ICD-10-CM | POA: Diagnosis not present

## 2020-07-31 DIAGNOSIS — D631 Anemia in chronic kidney disease: Secondary | ICD-10-CM | POA: Diagnosis not present

## 2020-07-31 DIAGNOSIS — Z23 Encounter for immunization: Secondary | ICD-10-CM | POA: Diagnosis not present

## 2020-08-01 DIAGNOSIS — D631 Anemia in chronic kidney disease: Secondary | ICD-10-CM | POA: Diagnosis not present

## 2020-08-01 DIAGNOSIS — N2581 Secondary hyperparathyroidism of renal origin: Secondary | ICD-10-CM | POA: Diagnosis not present

## 2020-08-01 DIAGNOSIS — N186 End stage renal disease: Secondary | ICD-10-CM | POA: Diagnosis not present

## 2020-08-02 DIAGNOSIS — N186 End stage renal disease: Secondary | ICD-10-CM | POA: Diagnosis not present

## 2020-08-02 DIAGNOSIS — N2581 Secondary hyperparathyroidism of renal origin: Secondary | ICD-10-CM | POA: Diagnosis not present

## 2020-08-02 DIAGNOSIS — D631 Anemia in chronic kidney disease: Secondary | ICD-10-CM | POA: Diagnosis not present

## 2020-08-03 DIAGNOSIS — D631 Anemia in chronic kidney disease: Secondary | ICD-10-CM | POA: Diagnosis not present

## 2020-08-03 DIAGNOSIS — N2581 Secondary hyperparathyroidism of renal origin: Secondary | ICD-10-CM | POA: Diagnosis not present

## 2020-08-03 DIAGNOSIS — N186 End stage renal disease: Secondary | ICD-10-CM | POA: Diagnosis not present

## 2020-08-04 DIAGNOSIS — N186 End stage renal disease: Secondary | ICD-10-CM | POA: Diagnosis not present

## 2020-08-04 DIAGNOSIS — N2581 Secondary hyperparathyroidism of renal origin: Secondary | ICD-10-CM | POA: Diagnosis not present

## 2020-08-04 DIAGNOSIS — D631 Anemia in chronic kidney disease: Secondary | ICD-10-CM | POA: Diagnosis not present

## 2020-08-05 DIAGNOSIS — N186 End stage renal disease: Secondary | ICD-10-CM | POA: Diagnosis not present

## 2020-08-05 DIAGNOSIS — N2581 Secondary hyperparathyroidism of renal origin: Secondary | ICD-10-CM | POA: Diagnosis not present

## 2020-08-05 DIAGNOSIS — D631 Anemia in chronic kidney disease: Secondary | ICD-10-CM | POA: Diagnosis not present

## 2020-08-06 DIAGNOSIS — N186 End stage renal disease: Secondary | ICD-10-CM | POA: Diagnosis not present

## 2020-08-06 DIAGNOSIS — D631 Anemia in chronic kidney disease: Secondary | ICD-10-CM | POA: Diagnosis not present

## 2020-08-06 DIAGNOSIS — N2581 Secondary hyperparathyroidism of renal origin: Secondary | ICD-10-CM | POA: Diagnosis not present

## 2020-08-07 DIAGNOSIS — N2581 Secondary hyperparathyroidism of renal origin: Secondary | ICD-10-CM | POA: Diagnosis not present

## 2020-08-07 DIAGNOSIS — D631 Anemia in chronic kidney disease: Secondary | ICD-10-CM | POA: Diagnosis not present

## 2020-08-07 DIAGNOSIS — N186 End stage renal disease: Secondary | ICD-10-CM | POA: Diagnosis not present

## 2020-08-08 DIAGNOSIS — N186 End stage renal disease: Secondary | ICD-10-CM | POA: Diagnosis not present

## 2020-08-08 DIAGNOSIS — N2581 Secondary hyperparathyroidism of renal origin: Secondary | ICD-10-CM | POA: Diagnosis not present

## 2020-08-08 DIAGNOSIS — D631 Anemia in chronic kidney disease: Secondary | ICD-10-CM | POA: Diagnosis not present

## 2020-08-09 DIAGNOSIS — N186 End stage renal disease: Secondary | ICD-10-CM | POA: Diagnosis not present

## 2020-08-09 DIAGNOSIS — N2581 Secondary hyperparathyroidism of renal origin: Secondary | ICD-10-CM | POA: Diagnosis not present

## 2020-08-09 DIAGNOSIS — D631 Anemia in chronic kidney disease: Secondary | ICD-10-CM | POA: Diagnosis not present

## 2020-08-10 DIAGNOSIS — D631 Anemia in chronic kidney disease: Secondary | ICD-10-CM | POA: Diagnosis not present

## 2020-08-10 DIAGNOSIS — N186 End stage renal disease: Secondary | ICD-10-CM | POA: Diagnosis not present

## 2020-08-10 DIAGNOSIS — N2581 Secondary hyperparathyroidism of renal origin: Secondary | ICD-10-CM | POA: Diagnosis not present

## 2020-08-11 DIAGNOSIS — N186 End stage renal disease: Secondary | ICD-10-CM | POA: Diagnosis not present

## 2020-08-11 DIAGNOSIS — D631 Anemia in chronic kidney disease: Secondary | ICD-10-CM | POA: Diagnosis not present

## 2020-08-11 DIAGNOSIS — N2581 Secondary hyperparathyroidism of renal origin: Secondary | ICD-10-CM | POA: Diagnosis not present

## 2020-08-12 DIAGNOSIS — D631 Anemia in chronic kidney disease: Secondary | ICD-10-CM | POA: Diagnosis not present

## 2020-08-12 DIAGNOSIS — N186 End stage renal disease: Secondary | ICD-10-CM | POA: Diagnosis not present

## 2020-08-12 DIAGNOSIS — N2581 Secondary hyperparathyroidism of renal origin: Secondary | ICD-10-CM | POA: Diagnosis not present

## 2020-08-13 DIAGNOSIS — N2581 Secondary hyperparathyroidism of renal origin: Secondary | ICD-10-CM | POA: Diagnosis not present

## 2020-08-13 DIAGNOSIS — N186 End stage renal disease: Secondary | ICD-10-CM | POA: Diagnosis not present

## 2020-08-13 DIAGNOSIS — D631 Anemia in chronic kidney disease: Secondary | ICD-10-CM | POA: Diagnosis not present

## 2020-08-14 DIAGNOSIS — N186 End stage renal disease: Secondary | ICD-10-CM | POA: Diagnosis not present

## 2020-08-14 DIAGNOSIS — N2581 Secondary hyperparathyroidism of renal origin: Secondary | ICD-10-CM | POA: Diagnosis not present

## 2020-08-14 DIAGNOSIS — D631 Anemia in chronic kidney disease: Secondary | ICD-10-CM | POA: Diagnosis not present

## 2020-08-15 DIAGNOSIS — N186 End stage renal disease: Secondary | ICD-10-CM | POA: Diagnosis not present

## 2020-08-15 DIAGNOSIS — N2581 Secondary hyperparathyroidism of renal origin: Secondary | ICD-10-CM | POA: Diagnosis not present

## 2020-08-15 DIAGNOSIS — D631 Anemia in chronic kidney disease: Secondary | ICD-10-CM | POA: Diagnosis not present

## 2020-08-16 DIAGNOSIS — D631 Anemia in chronic kidney disease: Secondary | ICD-10-CM | POA: Diagnosis not present

## 2020-08-16 DIAGNOSIS — N2581 Secondary hyperparathyroidism of renal origin: Secondary | ICD-10-CM | POA: Diagnosis not present

## 2020-08-16 DIAGNOSIS — N186 End stage renal disease: Secondary | ICD-10-CM | POA: Diagnosis not present

## 2020-08-17 DIAGNOSIS — N186 End stage renal disease: Secondary | ICD-10-CM | POA: Diagnosis not present

## 2020-08-17 DIAGNOSIS — D631 Anemia in chronic kidney disease: Secondary | ICD-10-CM | POA: Diagnosis not present

## 2020-08-17 DIAGNOSIS — N2581 Secondary hyperparathyroidism of renal origin: Secondary | ICD-10-CM | POA: Diagnosis not present

## 2020-08-18 DIAGNOSIS — N2581 Secondary hyperparathyroidism of renal origin: Secondary | ICD-10-CM | POA: Diagnosis not present

## 2020-08-18 DIAGNOSIS — N186 End stage renal disease: Secondary | ICD-10-CM | POA: Diagnosis not present

## 2020-08-18 DIAGNOSIS — D631 Anemia in chronic kidney disease: Secondary | ICD-10-CM | POA: Diagnosis not present

## 2020-08-19 DIAGNOSIS — D631 Anemia in chronic kidney disease: Secondary | ICD-10-CM | POA: Diagnosis not present

## 2020-08-19 DIAGNOSIS — N186 End stage renal disease: Secondary | ICD-10-CM | POA: Diagnosis not present

## 2020-08-19 DIAGNOSIS — N2581 Secondary hyperparathyroidism of renal origin: Secondary | ICD-10-CM | POA: Diagnosis not present

## 2020-08-20 DIAGNOSIS — N2581 Secondary hyperparathyroidism of renal origin: Secondary | ICD-10-CM | POA: Diagnosis not present

## 2020-08-20 DIAGNOSIS — N186 End stage renal disease: Secondary | ICD-10-CM | POA: Diagnosis not present

## 2020-08-20 DIAGNOSIS — D631 Anemia in chronic kidney disease: Secondary | ICD-10-CM | POA: Diagnosis not present

## 2020-08-21 DIAGNOSIS — N2581 Secondary hyperparathyroidism of renal origin: Secondary | ICD-10-CM | POA: Diagnosis not present

## 2020-08-21 DIAGNOSIS — N186 End stage renal disease: Secondary | ICD-10-CM | POA: Diagnosis not present

## 2020-08-21 DIAGNOSIS — D631 Anemia in chronic kidney disease: Secondary | ICD-10-CM | POA: Diagnosis not present

## 2020-08-22 DIAGNOSIS — N186 End stage renal disease: Secondary | ICD-10-CM | POA: Diagnosis not present

## 2020-08-22 DIAGNOSIS — Z992 Dependence on renal dialysis: Secondary | ICD-10-CM | POA: Diagnosis not present

## 2020-08-22 DIAGNOSIS — N2581 Secondary hyperparathyroidism of renal origin: Secondary | ICD-10-CM | POA: Diagnosis not present

## 2020-08-22 DIAGNOSIS — D631 Anemia in chronic kidney disease: Secondary | ICD-10-CM | POA: Diagnosis not present

## 2020-08-23 DIAGNOSIS — R634 Abnormal weight loss: Secondary | ICD-10-CM | POA: Diagnosis not present

## 2020-08-23 DIAGNOSIS — N2581 Secondary hyperparathyroidism of renal origin: Secondary | ICD-10-CM | POA: Diagnosis not present

## 2020-08-23 DIAGNOSIS — Z23 Encounter for immunization: Secondary | ICD-10-CM | POA: Diagnosis not present

## 2020-08-23 DIAGNOSIS — D631 Anemia in chronic kidney disease: Secondary | ICD-10-CM | POA: Diagnosis not present

## 2020-08-23 DIAGNOSIS — N186 End stage renal disease: Secondary | ICD-10-CM | POA: Diagnosis not present

## 2020-08-24 DIAGNOSIS — D631 Anemia in chronic kidney disease: Secondary | ICD-10-CM | POA: Diagnosis not present

## 2020-08-24 DIAGNOSIS — Z23 Encounter for immunization: Secondary | ICD-10-CM | POA: Diagnosis not present

## 2020-08-24 DIAGNOSIS — N2581 Secondary hyperparathyroidism of renal origin: Secondary | ICD-10-CM | POA: Diagnosis not present

## 2020-08-24 DIAGNOSIS — N186 End stage renal disease: Secondary | ICD-10-CM | POA: Diagnosis not present

## 2020-08-25 DIAGNOSIS — N2581 Secondary hyperparathyroidism of renal origin: Secondary | ICD-10-CM | POA: Diagnosis not present

## 2020-08-25 DIAGNOSIS — F250 Schizoaffective disorder, bipolar type: Secondary | ICD-10-CM | POA: Diagnosis not present

## 2020-08-25 DIAGNOSIS — N186 End stage renal disease: Secondary | ICD-10-CM | POA: Diagnosis not present

## 2020-08-25 DIAGNOSIS — F29 Unspecified psychosis not due to a substance or known physiological condition: Secondary | ICD-10-CM | POA: Diagnosis not present

## 2020-08-25 DIAGNOSIS — D631 Anemia in chronic kidney disease: Secondary | ICD-10-CM | POA: Diagnosis not present

## 2020-08-25 DIAGNOSIS — Z23 Encounter for immunization: Secondary | ICD-10-CM | POA: Diagnosis not present

## 2020-08-25 DIAGNOSIS — F319 Bipolar disorder, unspecified: Secondary | ICD-10-CM | POA: Diagnosis not present

## 2020-08-25 DIAGNOSIS — F71 Moderate intellectual disabilities: Secondary | ICD-10-CM | POA: Diagnosis not present

## 2020-08-26 DIAGNOSIS — N186 End stage renal disease: Secondary | ICD-10-CM | POA: Diagnosis not present

## 2020-08-26 DIAGNOSIS — N2581 Secondary hyperparathyroidism of renal origin: Secondary | ICD-10-CM | POA: Diagnosis not present

## 2020-08-26 DIAGNOSIS — Z23 Encounter for immunization: Secondary | ICD-10-CM | POA: Diagnosis not present

## 2020-08-26 DIAGNOSIS — D631 Anemia in chronic kidney disease: Secondary | ICD-10-CM | POA: Diagnosis not present

## 2020-08-27 DIAGNOSIS — Z23 Encounter for immunization: Secondary | ICD-10-CM | POA: Diagnosis not present

## 2020-08-27 DIAGNOSIS — D631 Anemia in chronic kidney disease: Secondary | ICD-10-CM | POA: Diagnosis not present

## 2020-08-27 DIAGNOSIS — N186 End stage renal disease: Secondary | ICD-10-CM | POA: Diagnosis not present

## 2020-08-27 DIAGNOSIS — N2581 Secondary hyperparathyroidism of renal origin: Secondary | ICD-10-CM | POA: Diagnosis not present

## 2020-08-28 DIAGNOSIS — N186 End stage renal disease: Secondary | ICD-10-CM | POA: Diagnosis not present

## 2020-08-28 DIAGNOSIS — Z23 Encounter for immunization: Secondary | ICD-10-CM | POA: Diagnosis not present

## 2020-08-28 DIAGNOSIS — N2581 Secondary hyperparathyroidism of renal origin: Secondary | ICD-10-CM | POA: Diagnosis not present

## 2020-08-28 DIAGNOSIS — D631 Anemia in chronic kidney disease: Secondary | ICD-10-CM | POA: Diagnosis not present

## 2020-08-29 DIAGNOSIS — N2581 Secondary hyperparathyroidism of renal origin: Secondary | ICD-10-CM | POA: Diagnosis not present

## 2020-08-29 DIAGNOSIS — N186 End stage renal disease: Secondary | ICD-10-CM | POA: Diagnosis not present

## 2020-08-29 DIAGNOSIS — Z23 Encounter for immunization: Secondary | ICD-10-CM | POA: Diagnosis not present

## 2020-08-29 DIAGNOSIS — D631 Anemia in chronic kidney disease: Secondary | ICD-10-CM | POA: Diagnosis not present

## 2020-08-30 DIAGNOSIS — N2581 Secondary hyperparathyroidism of renal origin: Secondary | ICD-10-CM | POA: Diagnosis not present

## 2020-08-30 DIAGNOSIS — N186 End stage renal disease: Secondary | ICD-10-CM | POA: Diagnosis not present

## 2020-08-30 DIAGNOSIS — D631 Anemia in chronic kidney disease: Secondary | ICD-10-CM | POA: Diagnosis not present

## 2020-08-30 DIAGNOSIS — Z23 Encounter for immunization: Secondary | ICD-10-CM | POA: Diagnosis not present

## 2020-08-31 DIAGNOSIS — D631 Anemia in chronic kidney disease: Secondary | ICD-10-CM | POA: Diagnosis not present

## 2020-08-31 DIAGNOSIS — N186 End stage renal disease: Secondary | ICD-10-CM | POA: Diagnosis not present

## 2020-08-31 DIAGNOSIS — N2581 Secondary hyperparathyroidism of renal origin: Secondary | ICD-10-CM | POA: Diagnosis not present

## 2020-08-31 DIAGNOSIS — Z23 Encounter for immunization: Secondary | ICD-10-CM | POA: Diagnosis not present

## 2020-09-01 DIAGNOSIS — N2581 Secondary hyperparathyroidism of renal origin: Secondary | ICD-10-CM | POA: Diagnosis not present

## 2020-09-01 DIAGNOSIS — D631 Anemia in chronic kidney disease: Secondary | ICD-10-CM | POA: Diagnosis not present

## 2020-09-01 DIAGNOSIS — N186 End stage renal disease: Secondary | ICD-10-CM | POA: Diagnosis not present

## 2020-09-01 DIAGNOSIS — Z23 Encounter for immunization: Secondary | ICD-10-CM | POA: Diagnosis not present

## 2020-09-02 DIAGNOSIS — N186 End stage renal disease: Secondary | ICD-10-CM | POA: Diagnosis not present

## 2020-09-02 DIAGNOSIS — N2581 Secondary hyperparathyroidism of renal origin: Secondary | ICD-10-CM | POA: Diagnosis not present

## 2020-09-02 DIAGNOSIS — D631 Anemia in chronic kidney disease: Secondary | ICD-10-CM | POA: Diagnosis not present

## 2020-09-02 DIAGNOSIS — Z23 Encounter for immunization: Secondary | ICD-10-CM | POA: Diagnosis not present

## 2020-09-03 DIAGNOSIS — Z23 Encounter for immunization: Secondary | ICD-10-CM | POA: Diagnosis not present

## 2020-09-03 DIAGNOSIS — N2581 Secondary hyperparathyroidism of renal origin: Secondary | ICD-10-CM | POA: Diagnosis not present

## 2020-09-03 DIAGNOSIS — N186 End stage renal disease: Secondary | ICD-10-CM | POA: Diagnosis not present

## 2020-09-03 DIAGNOSIS — D631 Anemia in chronic kidney disease: Secondary | ICD-10-CM | POA: Diagnosis not present

## 2020-09-04 DIAGNOSIS — Z23 Encounter for immunization: Secondary | ICD-10-CM | POA: Diagnosis not present

## 2020-09-04 DIAGNOSIS — N2581 Secondary hyperparathyroidism of renal origin: Secondary | ICD-10-CM | POA: Diagnosis not present

## 2020-09-04 DIAGNOSIS — N186 End stage renal disease: Secondary | ICD-10-CM | POA: Diagnosis not present

## 2020-09-04 DIAGNOSIS — D631 Anemia in chronic kidney disease: Secondary | ICD-10-CM | POA: Diagnosis not present

## 2020-09-05 DIAGNOSIS — N2581 Secondary hyperparathyroidism of renal origin: Secondary | ICD-10-CM | POA: Diagnosis not present

## 2020-09-05 DIAGNOSIS — N186 End stage renal disease: Secondary | ICD-10-CM | POA: Diagnosis not present

## 2020-09-05 DIAGNOSIS — D631 Anemia in chronic kidney disease: Secondary | ICD-10-CM | POA: Diagnosis not present

## 2020-09-05 DIAGNOSIS — Z23 Encounter for immunization: Secondary | ICD-10-CM | POA: Diagnosis not present

## 2020-09-06 DIAGNOSIS — Z23 Encounter for immunization: Secondary | ICD-10-CM | POA: Diagnosis not present

## 2020-09-06 DIAGNOSIS — N2581 Secondary hyperparathyroidism of renal origin: Secondary | ICD-10-CM | POA: Diagnosis not present

## 2020-09-06 DIAGNOSIS — D631 Anemia in chronic kidney disease: Secondary | ICD-10-CM | POA: Diagnosis not present

## 2020-09-06 DIAGNOSIS — N186 End stage renal disease: Secondary | ICD-10-CM | POA: Diagnosis not present

## 2020-09-07 DIAGNOSIS — D631 Anemia in chronic kidney disease: Secondary | ICD-10-CM | POA: Diagnosis not present

## 2020-09-07 DIAGNOSIS — N186 End stage renal disease: Secondary | ICD-10-CM | POA: Diagnosis not present

## 2020-09-07 DIAGNOSIS — Z23 Encounter for immunization: Secondary | ICD-10-CM | POA: Diagnosis not present

## 2020-09-07 DIAGNOSIS — N2581 Secondary hyperparathyroidism of renal origin: Secondary | ICD-10-CM | POA: Diagnosis not present

## 2020-09-08 DIAGNOSIS — N2581 Secondary hyperparathyroidism of renal origin: Secondary | ICD-10-CM | POA: Diagnosis not present

## 2020-09-08 DIAGNOSIS — Z23 Encounter for immunization: Secondary | ICD-10-CM | POA: Diagnosis not present

## 2020-09-08 DIAGNOSIS — N186 End stage renal disease: Secondary | ICD-10-CM | POA: Diagnosis not present

## 2020-09-08 DIAGNOSIS — D631 Anemia in chronic kidney disease: Secondary | ICD-10-CM | POA: Diagnosis not present

## 2020-09-09 DIAGNOSIS — Z23 Encounter for immunization: Secondary | ICD-10-CM | POA: Diagnosis not present

## 2020-09-09 DIAGNOSIS — N186 End stage renal disease: Secondary | ICD-10-CM | POA: Diagnosis not present

## 2020-09-09 DIAGNOSIS — N2581 Secondary hyperparathyroidism of renal origin: Secondary | ICD-10-CM | POA: Diagnosis not present

## 2020-09-09 DIAGNOSIS — D631 Anemia in chronic kidney disease: Secondary | ICD-10-CM | POA: Diagnosis not present

## 2020-09-10 DIAGNOSIS — Z23 Encounter for immunization: Secondary | ICD-10-CM | POA: Diagnosis not present

## 2020-09-10 DIAGNOSIS — N2581 Secondary hyperparathyroidism of renal origin: Secondary | ICD-10-CM | POA: Diagnosis not present

## 2020-09-10 DIAGNOSIS — D631 Anemia in chronic kidney disease: Secondary | ICD-10-CM | POA: Diagnosis not present

## 2020-09-10 DIAGNOSIS — N186 End stage renal disease: Secondary | ICD-10-CM | POA: Diagnosis not present

## 2020-09-11 DIAGNOSIS — N186 End stage renal disease: Secondary | ICD-10-CM | POA: Diagnosis not present

## 2020-09-11 DIAGNOSIS — Z23 Encounter for immunization: Secondary | ICD-10-CM | POA: Diagnosis not present

## 2020-09-11 DIAGNOSIS — N2581 Secondary hyperparathyroidism of renal origin: Secondary | ICD-10-CM | POA: Diagnosis not present

## 2020-09-11 DIAGNOSIS — D631 Anemia in chronic kidney disease: Secondary | ICD-10-CM | POA: Diagnosis not present

## 2020-09-12 DIAGNOSIS — Z23 Encounter for immunization: Secondary | ICD-10-CM | POA: Diagnosis not present

## 2020-09-12 DIAGNOSIS — D631 Anemia in chronic kidney disease: Secondary | ICD-10-CM | POA: Diagnosis not present

## 2020-09-12 DIAGNOSIS — N186 End stage renal disease: Secondary | ICD-10-CM | POA: Diagnosis not present

## 2020-09-12 DIAGNOSIS — N2581 Secondary hyperparathyroidism of renal origin: Secondary | ICD-10-CM | POA: Diagnosis not present

## 2020-09-13 DIAGNOSIS — N2581 Secondary hyperparathyroidism of renal origin: Secondary | ICD-10-CM | POA: Diagnosis not present

## 2020-09-13 DIAGNOSIS — N186 End stage renal disease: Secondary | ICD-10-CM | POA: Diagnosis not present

## 2020-09-13 DIAGNOSIS — D631 Anemia in chronic kidney disease: Secondary | ICD-10-CM | POA: Diagnosis not present

## 2020-09-13 DIAGNOSIS — Z23 Encounter for immunization: Secondary | ICD-10-CM | POA: Diagnosis not present

## 2020-09-14 DIAGNOSIS — Z23 Encounter for immunization: Secondary | ICD-10-CM | POA: Diagnosis not present

## 2020-09-14 DIAGNOSIS — D631 Anemia in chronic kidney disease: Secondary | ICD-10-CM | POA: Diagnosis not present

## 2020-09-14 DIAGNOSIS — N186 End stage renal disease: Secondary | ICD-10-CM | POA: Diagnosis not present

## 2020-09-14 DIAGNOSIS — N2581 Secondary hyperparathyroidism of renal origin: Secondary | ICD-10-CM | POA: Diagnosis not present

## 2020-09-15 DIAGNOSIS — D631 Anemia in chronic kidney disease: Secondary | ICD-10-CM | POA: Diagnosis not present

## 2020-09-15 DIAGNOSIS — Z23 Encounter for immunization: Secondary | ICD-10-CM | POA: Diagnosis not present

## 2020-09-15 DIAGNOSIS — N2581 Secondary hyperparathyroidism of renal origin: Secondary | ICD-10-CM | POA: Diagnosis not present

## 2020-09-15 DIAGNOSIS — N186 End stage renal disease: Secondary | ICD-10-CM | POA: Diagnosis not present

## 2020-09-16 DIAGNOSIS — N186 End stage renal disease: Secondary | ICD-10-CM | POA: Diagnosis not present

## 2020-09-16 DIAGNOSIS — N2581 Secondary hyperparathyroidism of renal origin: Secondary | ICD-10-CM | POA: Diagnosis not present

## 2020-09-16 DIAGNOSIS — D631 Anemia in chronic kidney disease: Secondary | ICD-10-CM | POA: Diagnosis not present

## 2020-09-16 DIAGNOSIS — Z23 Encounter for immunization: Secondary | ICD-10-CM | POA: Diagnosis not present

## 2020-09-17 DIAGNOSIS — N186 End stage renal disease: Secondary | ICD-10-CM | POA: Diagnosis not present

## 2020-09-17 DIAGNOSIS — Z23 Encounter for immunization: Secondary | ICD-10-CM | POA: Diagnosis not present

## 2020-09-17 DIAGNOSIS — D631 Anemia in chronic kidney disease: Secondary | ICD-10-CM | POA: Diagnosis not present

## 2020-09-17 DIAGNOSIS — N2581 Secondary hyperparathyroidism of renal origin: Secondary | ICD-10-CM | POA: Diagnosis not present

## 2020-09-18 DIAGNOSIS — N186 End stage renal disease: Secondary | ICD-10-CM | POA: Diagnosis not present

## 2020-09-18 DIAGNOSIS — Z23 Encounter for immunization: Secondary | ICD-10-CM | POA: Diagnosis not present

## 2020-09-18 DIAGNOSIS — D631 Anemia in chronic kidney disease: Secondary | ICD-10-CM | POA: Diagnosis not present

## 2020-09-18 DIAGNOSIS — N2581 Secondary hyperparathyroidism of renal origin: Secondary | ICD-10-CM | POA: Diagnosis not present

## 2020-09-19 DIAGNOSIS — D631 Anemia in chronic kidney disease: Secondary | ICD-10-CM | POA: Diagnosis not present

## 2020-09-19 DIAGNOSIS — N2581 Secondary hyperparathyroidism of renal origin: Secondary | ICD-10-CM | POA: Diagnosis not present

## 2020-09-19 DIAGNOSIS — Z23 Encounter for immunization: Secondary | ICD-10-CM | POA: Diagnosis not present

## 2020-09-19 DIAGNOSIS — N186 End stage renal disease: Secondary | ICD-10-CM | POA: Diagnosis not present

## 2020-09-20 DIAGNOSIS — H539 Unspecified visual disturbance: Secondary | ICD-10-CM | POA: Diagnosis not present

## 2020-09-20 DIAGNOSIS — R569 Unspecified convulsions: Secondary | ICD-10-CM | POA: Diagnosis not present

## 2020-09-20 DIAGNOSIS — D539 Nutritional anemia, unspecified: Secondary | ICD-10-CM | POA: Diagnosis not present

## 2020-09-20 DIAGNOSIS — E782 Mixed hyperlipidemia: Secondary | ICD-10-CM | POA: Diagnosis not present

## 2020-09-20 DIAGNOSIS — Z3042 Encounter for surveillance of injectable contraceptive: Secondary | ICD-10-CM | POA: Diagnosis not present

## 2020-09-20 DIAGNOSIS — D631 Anemia in chronic kidney disease: Secondary | ICD-10-CM | POA: Diagnosis not present

## 2020-09-20 DIAGNOSIS — N2581 Secondary hyperparathyroidism of renal origin: Secondary | ICD-10-CM | POA: Diagnosis not present

## 2020-09-20 DIAGNOSIS — Z131 Encounter for screening for diabetes mellitus: Secondary | ICD-10-CM | POA: Diagnosis not present

## 2020-09-20 DIAGNOSIS — R110 Nausea: Secondary | ICD-10-CM | POA: Diagnosis not present

## 2020-09-20 DIAGNOSIS — N186 End stage renal disease: Secondary | ICD-10-CM | POA: Diagnosis not present

## 2020-09-20 DIAGNOSIS — K219 Gastro-esophageal reflux disease without esophagitis: Secondary | ICD-10-CM | POA: Diagnosis not present

## 2020-09-20 DIAGNOSIS — E039 Hypothyroidism, unspecified: Secondary | ICD-10-CM | POA: Diagnosis not present

## 2020-09-20 DIAGNOSIS — Z23 Encounter for immunization: Secondary | ICD-10-CM | POA: Diagnosis not present

## 2020-09-21 DIAGNOSIS — N186 End stage renal disease: Secondary | ICD-10-CM | POA: Diagnosis not present

## 2020-09-21 DIAGNOSIS — Z23 Encounter for immunization: Secondary | ICD-10-CM | POA: Diagnosis not present

## 2020-09-21 DIAGNOSIS — Z992 Dependence on renal dialysis: Secondary | ICD-10-CM | POA: Diagnosis not present

## 2020-09-21 DIAGNOSIS — D631 Anemia in chronic kidney disease: Secondary | ICD-10-CM | POA: Diagnosis not present

## 2020-09-21 DIAGNOSIS — N2581 Secondary hyperparathyroidism of renal origin: Secondary | ICD-10-CM | POA: Diagnosis not present

## 2020-09-22 DIAGNOSIS — N2581 Secondary hyperparathyroidism of renal origin: Secondary | ICD-10-CM | POA: Diagnosis not present

## 2020-09-22 DIAGNOSIS — D631 Anemia in chronic kidney disease: Secondary | ICD-10-CM | POA: Diagnosis not present

## 2020-09-22 DIAGNOSIS — Z992 Dependence on renal dialysis: Secondary | ICD-10-CM | POA: Diagnosis not present

## 2020-09-22 DIAGNOSIS — T8571XA Infection and inflammatory reaction due to peritoneal dialysis catheter, initial encounter: Secondary | ICD-10-CM | POA: Diagnosis not present

## 2020-09-22 DIAGNOSIS — N186 End stage renal disease: Secondary | ICD-10-CM | POA: Diagnosis not present

## 2020-09-23 DIAGNOSIS — Z992 Dependence on renal dialysis: Secondary | ICD-10-CM | POA: Diagnosis not present

## 2020-09-23 DIAGNOSIS — N2581 Secondary hyperparathyroidism of renal origin: Secondary | ICD-10-CM | POA: Diagnosis not present

## 2020-09-23 DIAGNOSIS — D631 Anemia in chronic kidney disease: Secondary | ICD-10-CM | POA: Diagnosis not present

## 2020-09-23 DIAGNOSIS — T8571XA Infection and inflammatory reaction due to peritoneal dialysis catheter, initial encounter: Secondary | ICD-10-CM | POA: Diagnosis not present

## 2020-09-23 DIAGNOSIS — N186 End stage renal disease: Secondary | ICD-10-CM | POA: Diagnosis not present

## 2020-09-24 DIAGNOSIS — T8571XA Infection and inflammatory reaction due to peritoneal dialysis catheter, initial encounter: Secondary | ICD-10-CM | POA: Diagnosis not present

## 2020-09-24 DIAGNOSIS — N2581 Secondary hyperparathyroidism of renal origin: Secondary | ICD-10-CM | POA: Diagnosis not present

## 2020-09-24 DIAGNOSIS — N186 End stage renal disease: Secondary | ICD-10-CM | POA: Diagnosis not present

## 2020-09-24 DIAGNOSIS — Z992 Dependence on renal dialysis: Secondary | ICD-10-CM | POA: Diagnosis not present

## 2020-09-24 DIAGNOSIS — D631 Anemia in chronic kidney disease: Secondary | ICD-10-CM | POA: Diagnosis not present

## 2020-09-25 DIAGNOSIS — T8571XA Infection and inflammatory reaction due to peritoneal dialysis catheter, initial encounter: Secondary | ICD-10-CM | POA: Diagnosis not present

## 2020-09-25 DIAGNOSIS — D631 Anemia in chronic kidney disease: Secondary | ICD-10-CM | POA: Diagnosis not present

## 2020-09-25 DIAGNOSIS — Z992 Dependence on renal dialysis: Secondary | ICD-10-CM | POA: Diagnosis not present

## 2020-09-25 DIAGNOSIS — N186 End stage renal disease: Secondary | ICD-10-CM | POA: Diagnosis not present

## 2020-09-25 DIAGNOSIS — N2581 Secondary hyperparathyroidism of renal origin: Secondary | ICD-10-CM | POA: Diagnosis not present

## 2020-09-26 DIAGNOSIS — T8571XA Infection and inflammatory reaction due to peritoneal dialysis catheter, initial encounter: Secondary | ICD-10-CM | POA: Diagnosis not present

## 2020-09-26 DIAGNOSIS — Z992 Dependence on renal dialysis: Secondary | ICD-10-CM | POA: Diagnosis not present

## 2020-09-26 DIAGNOSIS — N186 End stage renal disease: Secondary | ICD-10-CM | POA: Diagnosis not present

## 2020-09-26 DIAGNOSIS — D631 Anemia in chronic kidney disease: Secondary | ICD-10-CM | POA: Diagnosis not present

## 2020-09-26 DIAGNOSIS — N2581 Secondary hyperparathyroidism of renal origin: Secondary | ICD-10-CM | POA: Diagnosis not present

## 2020-09-27 DIAGNOSIS — T8571XA Infection and inflammatory reaction due to peritoneal dialysis catheter, initial encounter: Secondary | ICD-10-CM | POA: Diagnosis not present

## 2020-09-27 DIAGNOSIS — D631 Anemia in chronic kidney disease: Secondary | ICD-10-CM | POA: Diagnosis not present

## 2020-09-27 DIAGNOSIS — Z992 Dependence on renal dialysis: Secondary | ICD-10-CM | POA: Diagnosis not present

## 2020-09-27 DIAGNOSIS — N186 End stage renal disease: Secondary | ICD-10-CM | POA: Diagnosis not present

## 2020-09-27 DIAGNOSIS — N2581 Secondary hyperparathyroidism of renal origin: Secondary | ICD-10-CM | POA: Diagnosis not present

## 2020-09-28 DIAGNOSIS — Z992 Dependence on renal dialysis: Secondary | ICD-10-CM | POA: Diagnosis not present

## 2020-09-28 DIAGNOSIS — N186 End stage renal disease: Secondary | ICD-10-CM | POA: Diagnosis not present

## 2020-09-28 DIAGNOSIS — T8571XA Infection and inflammatory reaction due to peritoneal dialysis catheter, initial encounter: Secondary | ICD-10-CM | POA: Diagnosis not present

## 2020-09-28 DIAGNOSIS — D631 Anemia in chronic kidney disease: Secondary | ICD-10-CM | POA: Diagnosis not present

## 2020-09-28 DIAGNOSIS — N2581 Secondary hyperparathyroidism of renal origin: Secondary | ICD-10-CM | POA: Diagnosis not present

## 2020-09-29 DIAGNOSIS — Z992 Dependence on renal dialysis: Secondary | ICD-10-CM | POA: Diagnosis not present

## 2020-09-29 DIAGNOSIS — T8571XA Infection and inflammatory reaction due to peritoneal dialysis catheter, initial encounter: Secondary | ICD-10-CM | POA: Diagnosis not present

## 2020-09-29 DIAGNOSIS — N2581 Secondary hyperparathyroidism of renal origin: Secondary | ICD-10-CM | POA: Diagnosis not present

## 2020-09-29 DIAGNOSIS — N186 End stage renal disease: Secondary | ICD-10-CM | POA: Diagnosis not present

## 2020-09-29 DIAGNOSIS — D631 Anemia in chronic kidney disease: Secondary | ICD-10-CM | POA: Diagnosis not present

## 2020-09-30 DIAGNOSIS — N186 End stage renal disease: Secondary | ICD-10-CM | POA: Diagnosis not present

## 2020-09-30 DIAGNOSIS — T8571XA Infection and inflammatory reaction due to peritoneal dialysis catheter, initial encounter: Secondary | ICD-10-CM | POA: Diagnosis not present

## 2020-09-30 DIAGNOSIS — N2581 Secondary hyperparathyroidism of renal origin: Secondary | ICD-10-CM | POA: Diagnosis not present

## 2020-09-30 DIAGNOSIS — D631 Anemia in chronic kidney disease: Secondary | ICD-10-CM | POA: Diagnosis not present

## 2020-09-30 DIAGNOSIS — Z992 Dependence on renal dialysis: Secondary | ICD-10-CM | POA: Diagnosis not present

## 2020-10-01 DIAGNOSIS — D631 Anemia in chronic kidney disease: Secondary | ICD-10-CM | POA: Diagnosis not present

## 2020-10-01 DIAGNOSIS — Z992 Dependence on renal dialysis: Secondary | ICD-10-CM | POA: Diagnosis not present

## 2020-10-01 DIAGNOSIS — N186 End stage renal disease: Secondary | ICD-10-CM | POA: Diagnosis not present

## 2020-10-01 DIAGNOSIS — N2581 Secondary hyperparathyroidism of renal origin: Secondary | ICD-10-CM | POA: Diagnosis not present

## 2020-10-01 DIAGNOSIS — T8571XA Infection and inflammatory reaction due to peritoneal dialysis catheter, initial encounter: Secondary | ICD-10-CM | POA: Diagnosis not present

## 2020-10-02 DIAGNOSIS — Z992 Dependence on renal dialysis: Secondary | ICD-10-CM | POA: Diagnosis not present

## 2020-10-02 DIAGNOSIS — N2581 Secondary hyperparathyroidism of renal origin: Secondary | ICD-10-CM | POA: Diagnosis not present

## 2020-10-02 DIAGNOSIS — N186 End stage renal disease: Secondary | ICD-10-CM | POA: Diagnosis not present

## 2020-10-02 DIAGNOSIS — T8571XA Infection and inflammatory reaction due to peritoneal dialysis catheter, initial encounter: Secondary | ICD-10-CM | POA: Diagnosis not present

## 2020-10-02 DIAGNOSIS — D631 Anemia in chronic kidney disease: Secondary | ICD-10-CM | POA: Diagnosis not present

## 2020-10-03 DIAGNOSIS — D631 Anemia in chronic kidney disease: Secondary | ICD-10-CM | POA: Diagnosis not present

## 2020-10-03 DIAGNOSIS — N186 End stage renal disease: Secondary | ICD-10-CM | POA: Diagnosis not present

## 2020-10-03 DIAGNOSIS — N2581 Secondary hyperparathyroidism of renal origin: Secondary | ICD-10-CM | POA: Diagnosis not present

## 2020-10-03 DIAGNOSIS — Z992 Dependence on renal dialysis: Secondary | ICD-10-CM | POA: Diagnosis not present

## 2020-10-03 DIAGNOSIS — T8571XA Infection and inflammatory reaction due to peritoneal dialysis catheter, initial encounter: Secondary | ICD-10-CM | POA: Diagnosis not present

## 2020-10-04 DIAGNOSIS — N2581 Secondary hyperparathyroidism of renal origin: Secondary | ICD-10-CM | POA: Diagnosis not present

## 2020-10-04 DIAGNOSIS — T8571XA Infection and inflammatory reaction due to peritoneal dialysis catheter, initial encounter: Secondary | ICD-10-CM | POA: Diagnosis not present

## 2020-10-04 DIAGNOSIS — D631 Anemia in chronic kidney disease: Secondary | ICD-10-CM | POA: Diagnosis not present

## 2020-10-04 DIAGNOSIS — N186 End stage renal disease: Secondary | ICD-10-CM | POA: Diagnosis not present

## 2020-10-04 DIAGNOSIS — Z992 Dependence on renal dialysis: Secondary | ICD-10-CM | POA: Diagnosis not present

## 2020-10-05 DIAGNOSIS — N186 End stage renal disease: Secondary | ICD-10-CM | POA: Diagnosis not present

## 2020-10-05 DIAGNOSIS — D631 Anemia in chronic kidney disease: Secondary | ICD-10-CM | POA: Diagnosis not present

## 2020-10-05 DIAGNOSIS — Z992 Dependence on renal dialysis: Secondary | ICD-10-CM | POA: Diagnosis not present

## 2020-10-05 DIAGNOSIS — N2581 Secondary hyperparathyroidism of renal origin: Secondary | ICD-10-CM | POA: Diagnosis not present

## 2020-10-05 DIAGNOSIS — T8571XA Infection and inflammatory reaction due to peritoneal dialysis catheter, initial encounter: Secondary | ICD-10-CM | POA: Diagnosis not present

## 2020-10-06 DIAGNOSIS — T8571XA Infection and inflammatory reaction due to peritoneal dialysis catheter, initial encounter: Secondary | ICD-10-CM | POA: Diagnosis not present

## 2020-10-06 DIAGNOSIS — N186 End stage renal disease: Secondary | ICD-10-CM | POA: Diagnosis not present

## 2020-10-06 DIAGNOSIS — T85611A Breakdown (mechanical) of intraperitoneal dialysis catheter, initial encounter: Secondary | ICD-10-CM | POA: Diagnosis not present

## 2020-10-06 DIAGNOSIS — N2581 Secondary hyperparathyroidism of renal origin: Secondary | ICD-10-CM | POA: Diagnosis not present

## 2020-10-06 DIAGNOSIS — Z5181 Encounter for therapeutic drug level monitoring: Secondary | ICD-10-CM | POA: Diagnosis not present

## 2020-10-06 DIAGNOSIS — I120 Hypertensive chronic kidney disease with stage 5 chronic kidney disease or end stage renal disease: Secondary | ICD-10-CM | POA: Diagnosis not present

## 2020-10-06 DIAGNOSIS — D631 Anemia in chronic kidney disease: Secondary | ICD-10-CM | POA: Diagnosis not present

## 2020-10-06 DIAGNOSIS — Z992 Dependence on renal dialysis: Secondary | ICD-10-CM | POA: Diagnosis not present

## 2020-10-07 DIAGNOSIS — N2581 Secondary hyperparathyroidism of renal origin: Secondary | ICD-10-CM | POA: Diagnosis not present

## 2020-10-07 DIAGNOSIS — N186 End stage renal disease: Secondary | ICD-10-CM | POA: Diagnosis not present

## 2020-10-07 DIAGNOSIS — T8571XA Infection and inflammatory reaction due to peritoneal dialysis catheter, initial encounter: Secondary | ICD-10-CM | POA: Diagnosis not present

## 2020-10-07 DIAGNOSIS — D631 Anemia in chronic kidney disease: Secondary | ICD-10-CM | POA: Diagnosis not present

## 2020-10-08 DIAGNOSIS — Z5181 Encounter for therapeutic drug level monitoring: Secondary | ICD-10-CM | POA: Diagnosis not present

## 2020-10-08 DIAGNOSIS — N2581 Secondary hyperparathyroidism of renal origin: Secondary | ICD-10-CM | POA: Diagnosis not present

## 2020-10-08 DIAGNOSIS — N186 End stage renal disease: Secondary | ICD-10-CM | POA: Diagnosis not present

## 2020-10-08 DIAGNOSIS — D631 Anemia in chronic kidney disease: Secondary | ICD-10-CM | POA: Diagnosis not present

## 2020-10-08 DIAGNOSIS — T8571XA Infection and inflammatory reaction due to peritoneal dialysis catheter, initial encounter: Secondary | ICD-10-CM | POA: Diagnosis not present

## 2020-10-09 DIAGNOSIS — N186 End stage renal disease: Secondary | ICD-10-CM | POA: Diagnosis not present

## 2020-10-09 DIAGNOSIS — T8571XA Infection and inflammatory reaction due to peritoneal dialysis catheter, initial encounter: Secondary | ICD-10-CM | POA: Diagnosis not present

## 2020-10-09 DIAGNOSIS — D631 Anemia in chronic kidney disease: Secondary | ICD-10-CM | POA: Diagnosis not present

## 2020-10-09 DIAGNOSIS — N2581 Secondary hyperparathyroidism of renal origin: Secondary | ICD-10-CM | POA: Diagnosis not present

## 2020-10-10 DIAGNOSIS — T8571XA Infection and inflammatory reaction due to peritoneal dialysis catheter, initial encounter: Secondary | ICD-10-CM | POA: Diagnosis not present

## 2020-10-10 DIAGNOSIS — D631 Anemia in chronic kidney disease: Secondary | ICD-10-CM | POA: Diagnosis not present

## 2020-10-10 DIAGNOSIS — N186 End stage renal disease: Secondary | ICD-10-CM | POA: Diagnosis not present

## 2020-10-10 DIAGNOSIS — N2581 Secondary hyperparathyroidism of renal origin: Secondary | ICD-10-CM | POA: Diagnosis not present

## 2020-10-11 DIAGNOSIS — E782 Mixed hyperlipidemia: Secondary | ICD-10-CM | POA: Diagnosis not present

## 2020-10-11 DIAGNOSIS — N2581 Secondary hyperparathyroidism of renal origin: Secondary | ICD-10-CM | POA: Diagnosis not present

## 2020-10-11 DIAGNOSIS — R569 Unspecified convulsions: Secondary | ICD-10-CM | POA: Diagnosis not present

## 2020-10-11 DIAGNOSIS — R110 Nausea: Secondary | ICD-10-CM | POA: Diagnosis not present

## 2020-10-11 DIAGNOSIS — T8571XA Infection and inflammatory reaction due to peritoneal dialysis catheter, initial encounter: Secondary | ICD-10-CM | POA: Diagnosis not present

## 2020-10-11 DIAGNOSIS — R269 Unspecified abnormalities of gait and mobility: Secondary | ICD-10-CM | POA: Diagnosis not present

## 2020-10-11 DIAGNOSIS — K219 Gastro-esophageal reflux disease without esophagitis: Secondary | ICD-10-CM | POA: Diagnosis not present

## 2020-10-11 DIAGNOSIS — Z131 Encounter for screening for diabetes mellitus: Secondary | ICD-10-CM | POA: Diagnosis not present

## 2020-10-11 DIAGNOSIS — N186 End stage renal disease: Secondary | ICD-10-CM | POA: Diagnosis not present

## 2020-10-11 DIAGNOSIS — Z3042 Encounter for surveillance of injectable contraceptive: Secondary | ICD-10-CM | POA: Diagnosis not present

## 2020-10-11 DIAGNOSIS — E039 Hypothyroidism, unspecified: Secondary | ICD-10-CM | POA: Diagnosis not present

## 2020-10-11 DIAGNOSIS — D631 Anemia in chronic kidney disease: Secondary | ICD-10-CM | POA: Diagnosis not present

## 2020-10-11 DIAGNOSIS — D539 Nutritional anemia, unspecified: Secondary | ICD-10-CM | POA: Diagnosis not present

## 2020-10-11 DIAGNOSIS — Z452 Encounter for adjustment and management of vascular access device: Secondary | ICD-10-CM | POA: Diagnosis not present

## 2020-10-12 DIAGNOSIS — N186 End stage renal disease: Secondary | ICD-10-CM | POA: Diagnosis not present

## 2020-10-12 DIAGNOSIS — Z4902 Encounter for fitting and adjustment of peritoneal dialysis catheter: Secondary | ICD-10-CM | POA: Diagnosis not present

## 2020-10-12 DIAGNOSIS — N2581 Secondary hyperparathyroidism of renal origin: Secondary | ICD-10-CM | POA: Diagnosis not present

## 2020-10-12 DIAGNOSIS — I120 Hypertensive chronic kidney disease with stage 5 chronic kidney disease or end stage renal disease: Secondary | ICD-10-CM | POA: Diagnosis not present

## 2020-10-12 DIAGNOSIS — T8571XA Infection and inflammatory reaction due to peritoneal dialysis catheter, initial encounter: Secondary | ICD-10-CM | POA: Diagnosis not present

## 2020-10-12 DIAGNOSIS — D631 Anemia in chronic kidney disease: Secondary | ICD-10-CM | POA: Diagnosis not present

## 2020-10-12 DIAGNOSIS — L02211 Cutaneous abscess of abdominal wall: Secondary | ICD-10-CM | POA: Diagnosis not present

## 2020-10-13 DIAGNOSIS — H2513 Age-related nuclear cataract, bilateral: Secondary | ICD-10-CM | POA: Diagnosis not present

## 2020-10-13 DIAGNOSIS — N186 End stage renal disease: Secondary | ICD-10-CM | POA: Diagnosis not present

## 2020-10-13 DIAGNOSIS — Z992 Dependence on renal dialysis: Secondary | ICD-10-CM | POA: Diagnosis not present

## 2020-10-14 DIAGNOSIS — N186 End stage renal disease: Secondary | ICD-10-CM | POA: Diagnosis not present

## 2020-10-14 DIAGNOSIS — T8571XA Infection and inflammatory reaction due to peritoneal dialysis catheter, initial encounter: Secondary | ICD-10-CM | POA: Diagnosis not present

## 2020-10-14 DIAGNOSIS — Z992 Dependence on renal dialysis: Secondary | ICD-10-CM | POA: Diagnosis not present

## 2020-10-15 DIAGNOSIS — Z992 Dependence on renal dialysis: Secondary | ICD-10-CM | POA: Diagnosis not present

## 2020-10-15 DIAGNOSIS — N186 End stage renal disease: Secondary | ICD-10-CM | POA: Diagnosis not present

## 2020-10-15 DIAGNOSIS — T8571XA Infection and inflammatory reaction due to peritoneal dialysis catheter, initial encounter: Secondary | ICD-10-CM | POA: Diagnosis not present

## 2020-10-16 DIAGNOSIS — Z992 Dependence on renal dialysis: Secondary | ICD-10-CM | POA: Diagnosis not present

## 2020-10-16 DIAGNOSIS — N186 End stage renal disease: Secondary | ICD-10-CM | POA: Diagnosis not present

## 2020-10-17 DIAGNOSIS — Z992 Dependence on renal dialysis: Secondary | ICD-10-CM | POA: Diagnosis not present

## 2020-10-17 DIAGNOSIS — N186 End stage renal disease: Secondary | ICD-10-CM | POA: Diagnosis not present

## 2020-10-18 DIAGNOSIS — Z992 Dependence on renal dialysis: Secondary | ICD-10-CM | POA: Diagnosis not present

## 2020-10-18 DIAGNOSIS — N186 End stage renal disease: Secondary | ICD-10-CM | POA: Diagnosis not present

## 2020-10-18 DIAGNOSIS — T8571XA Infection and inflammatory reaction due to peritoneal dialysis catheter, initial encounter: Secondary | ICD-10-CM | POA: Diagnosis not present

## 2020-10-18 DIAGNOSIS — Z5181 Encounter for therapeutic drug level monitoring: Secondary | ICD-10-CM | POA: Diagnosis not present

## 2020-10-19 DIAGNOSIS — N186 End stage renal disease: Secondary | ICD-10-CM | POA: Diagnosis not present

## 2020-10-19 DIAGNOSIS — Z992 Dependence on renal dialysis: Secondary | ICD-10-CM | POA: Diagnosis not present

## 2020-10-20 DIAGNOSIS — T8571XA Infection and inflammatory reaction due to peritoneal dialysis catheter, initial encounter: Secondary | ICD-10-CM | POA: Diagnosis not present

## 2020-10-20 DIAGNOSIS — Z992 Dependence on renal dialysis: Secondary | ICD-10-CM | POA: Diagnosis not present

## 2020-10-20 DIAGNOSIS — N186 End stage renal disease: Secondary | ICD-10-CM | POA: Diagnosis not present

## 2020-10-21 DIAGNOSIS — N186 End stage renal disease: Secondary | ICD-10-CM | POA: Diagnosis not present

## 2020-10-21 DIAGNOSIS — Z992 Dependence on renal dialysis: Secondary | ICD-10-CM | POA: Diagnosis not present

## 2020-10-22 DIAGNOSIS — T8571XA Infection and inflammatory reaction due to peritoneal dialysis catheter, initial encounter: Secondary | ICD-10-CM | POA: Diagnosis not present

## 2020-10-22 DIAGNOSIS — Z992 Dependence on renal dialysis: Secondary | ICD-10-CM | POA: Diagnosis not present

## 2020-10-22 DIAGNOSIS — Z5181 Encounter for therapeutic drug level monitoring: Secondary | ICD-10-CM | POA: Diagnosis not present

## 2020-10-22 DIAGNOSIS — N186 End stage renal disease: Secondary | ICD-10-CM | POA: Diagnosis not present

## 2020-10-25 DIAGNOSIS — T8571XA Infection and inflammatory reaction due to peritoneal dialysis catheter, initial encounter: Secondary | ICD-10-CM | POA: Diagnosis not present

## 2020-10-25 DIAGNOSIS — N186 End stage renal disease: Secondary | ICD-10-CM | POA: Diagnosis not present

## 2020-10-25 DIAGNOSIS — N2581 Secondary hyperparathyroidism of renal origin: Secondary | ICD-10-CM | POA: Diagnosis not present

## 2020-10-25 DIAGNOSIS — D631 Anemia in chronic kidney disease: Secondary | ICD-10-CM | POA: Diagnosis not present

## 2020-10-25 DIAGNOSIS — I10 Essential (primary) hypertension: Secondary | ICD-10-CM | POA: Diagnosis not present

## 2020-10-25 DIAGNOSIS — D509 Iron deficiency anemia, unspecified: Secondary | ICD-10-CM | POA: Diagnosis not present

## 2020-10-27 DIAGNOSIS — N186 End stage renal disease: Secondary | ICD-10-CM | POA: Diagnosis not present

## 2020-10-27 DIAGNOSIS — T8571XA Infection and inflammatory reaction due to peritoneal dialysis catheter, initial encounter: Secondary | ICD-10-CM | POA: Diagnosis not present

## 2020-10-27 DIAGNOSIS — D509 Iron deficiency anemia, unspecified: Secondary | ICD-10-CM | POA: Diagnosis not present

## 2020-10-27 DIAGNOSIS — I10 Essential (primary) hypertension: Secondary | ICD-10-CM | POA: Diagnosis not present

## 2020-10-27 DIAGNOSIS — E038 Other specified hypothyroidism: Secondary | ICD-10-CM | POA: Diagnosis not present

## 2020-10-27 DIAGNOSIS — D631 Anemia in chronic kidney disease: Secondary | ICD-10-CM | POA: Diagnosis not present

## 2020-10-27 DIAGNOSIS — N2581 Secondary hyperparathyroidism of renal origin: Secondary | ICD-10-CM | POA: Diagnosis not present

## 2020-10-29 DIAGNOSIS — N2581 Secondary hyperparathyroidism of renal origin: Secondary | ICD-10-CM | POA: Diagnosis not present

## 2020-10-29 DIAGNOSIS — D631 Anemia in chronic kidney disease: Secondary | ICD-10-CM | POA: Diagnosis not present

## 2020-10-29 DIAGNOSIS — N186 End stage renal disease: Secondary | ICD-10-CM | POA: Diagnosis not present

## 2020-10-29 DIAGNOSIS — I10 Essential (primary) hypertension: Secondary | ICD-10-CM | POA: Diagnosis not present

## 2020-10-29 DIAGNOSIS — D509 Iron deficiency anemia, unspecified: Secondary | ICD-10-CM | POA: Diagnosis not present

## 2020-10-29 DIAGNOSIS — T8571XA Infection and inflammatory reaction due to peritoneal dialysis catheter, initial encounter: Secondary | ICD-10-CM | POA: Diagnosis not present

## 2020-11-01 DIAGNOSIS — D631 Anemia in chronic kidney disease: Secondary | ICD-10-CM | POA: Diagnosis not present

## 2020-11-01 DIAGNOSIS — N186 End stage renal disease: Secondary | ICD-10-CM | POA: Diagnosis not present

## 2020-11-01 DIAGNOSIS — I10 Essential (primary) hypertension: Secondary | ICD-10-CM | POA: Diagnosis not present

## 2020-11-01 DIAGNOSIS — N2581 Secondary hyperparathyroidism of renal origin: Secondary | ICD-10-CM | POA: Diagnosis not present

## 2020-11-01 DIAGNOSIS — T8571XA Infection and inflammatory reaction due to peritoneal dialysis catheter, initial encounter: Secondary | ICD-10-CM | POA: Diagnosis not present

## 2020-11-01 DIAGNOSIS — D509 Iron deficiency anemia, unspecified: Secondary | ICD-10-CM | POA: Diagnosis not present

## 2020-11-02 DIAGNOSIS — Z4802 Encounter for removal of sutures: Secondary | ICD-10-CM | POA: Diagnosis not present

## 2020-11-02 DIAGNOSIS — Z4803 Encounter for change or removal of drains: Secondary | ICD-10-CM | POA: Diagnosis not present

## 2020-11-03 DIAGNOSIS — D631 Anemia in chronic kidney disease: Secondary | ICD-10-CM | POA: Diagnosis not present

## 2020-11-03 DIAGNOSIS — N186 End stage renal disease: Secondary | ICD-10-CM | POA: Diagnosis not present

## 2020-11-03 DIAGNOSIS — N2581 Secondary hyperparathyroidism of renal origin: Secondary | ICD-10-CM | POA: Diagnosis not present

## 2020-11-03 DIAGNOSIS — D509 Iron deficiency anemia, unspecified: Secondary | ICD-10-CM | POA: Diagnosis not present

## 2020-11-03 DIAGNOSIS — T8571XA Infection and inflammatory reaction due to peritoneal dialysis catheter, initial encounter: Secondary | ICD-10-CM | POA: Diagnosis not present

## 2020-11-03 DIAGNOSIS — I10 Essential (primary) hypertension: Secondary | ICD-10-CM | POA: Diagnosis not present

## 2020-11-05 DIAGNOSIS — N2581 Secondary hyperparathyroidism of renal origin: Secondary | ICD-10-CM | POA: Diagnosis not present

## 2020-11-05 DIAGNOSIS — D631 Anemia in chronic kidney disease: Secondary | ICD-10-CM | POA: Diagnosis not present

## 2020-11-05 DIAGNOSIS — T8571XA Infection and inflammatory reaction due to peritoneal dialysis catheter, initial encounter: Secondary | ICD-10-CM | POA: Diagnosis not present

## 2020-11-05 DIAGNOSIS — D509 Iron deficiency anemia, unspecified: Secondary | ICD-10-CM | POA: Diagnosis not present

## 2020-11-05 DIAGNOSIS — I10 Essential (primary) hypertension: Secondary | ICD-10-CM | POA: Diagnosis not present

## 2020-11-05 DIAGNOSIS — N186 End stage renal disease: Secondary | ICD-10-CM | POA: Diagnosis not present

## 2020-11-08 DIAGNOSIS — I10 Essential (primary) hypertension: Secondary | ICD-10-CM | POA: Diagnosis not present

## 2020-11-08 DIAGNOSIS — D631 Anemia in chronic kidney disease: Secondary | ICD-10-CM | POA: Diagnosis not present

## 2020-11-08 DIAGNOSIS — T8571XA Infection and inflammatory reaction due to peritoneal dialysis catheter, initial encounter: Secondary | ICD-10-CM | POA: Diagnosis not present

## 2020-11-08 DIAGNOSIS — D509 Iron deficiency anemia, unspecified: Secondary | ICD-10-CM | POA: Diagnosis not present

## 2020-11-08 DIAGNOSIS — N2581 Secondary hyperparathyroidism of renal origin: Secondary | ICD-10-CM | POA: Diagnosis not present

## 2020-11-08 DIAGNOSIS — N186 End stage renal disease: Secondary | ICD-10-CM | POA: Diagnosis not present

## 2020-11-09 ENCOUNTER — Ambulatory Visit: Payer: Medicare Other | Attending: Physician Assistant

## 2020-11-09 ENCOUNTER — Other Ambulatory Visit: Payer: Self-pay

## 2020-11-09 DIAGNOSIS — M6281 Muscle weakness (generalized): Secondary | ICD-10-CM | POA: Diagnosis not present

## 2020-11-09 DIAGNOSIS — R2689 Other abnormalities of gait and mobility: Secondary | ICD-10-CM | POA: Insufficient documentation

## 2020-11-09 NOTE — Therapy (Signed)
[place], [place], [address] Phone: [phone]   Fax:  [phone]  Physical Therapy Evaluation  Patient Details  Name: Jane [name] MRN: [id number] Date of Birth: Aug 12, 1966 Referring Provider (PT): Raelyn Number   Encounter Date: 11/09/2020   PT End of Session - 11/09/20 1106    Visit Number 1    Number of Visits 7    Date for PT Re-Evaluation 12/25/20    Authorization Type Medicare    Progress Note Due on Visit 10    PT Start Time 4401    PT Stop Time 1130    PT Time Calculation (min) 46 min    Activity Tolerance Patient tolerated treatment well;Other (comment)   patient apprehensive   Behavior During Therapy WFL for tasks assessed/performed;Anxious           Past Medical History:  Diagnosis Date  . Anemia   . Bipolar disorder ([place])   . Depression   . ESRD on peritoneal dialysis ([place])   . Hyperlipemia   . Hypertension   . Hypothyroidism   . Moderately mentally retarded   . Renal disease   . Schizophrenia ([place])   . Seizures ([place])    No recent seizures - no meds    Past Surgical History:  Procedure Laterality Date  . BREAST REDUCTION SURGERY    . BREAST SURGERY     breast reduction  . EXAMINATION UNDER ANESTHESIA N/A 12/08/2013   Procedure: EXAM UNDER ANESTHESIA;  Surgeon: Lavonia Drafts, MD;  Location: [facility];  Service: Gynecology;  Laterality: N/A;  Pelvic exam and pap smear, unable to tolerate during last office visit  . FOOT SURGERY     right  . REDUCTION MAMMAPLASTY Bilateral     There were no vitals filed for this visit.    Subjective Assessment - 11/09/20 1049    Subjective Brother in-law reports patient has shuffling with walking and has a lot of difficulty going up and down stairs. Patient denies any falls, though reports soreness in both of her legs. Brother in-law reports noticing difficulty with walking, transitions, and stairs over the past 7 months.     Patient is accompained by: Family member   brother in-law   Pertinent History mental retardation, schizoaffective disorder, end stage renal disease, hypertension    Limitations Walking;Standing    How long can you sit comfortably? unlimited    How long can you stand comfortably? "a couple minutes"    How long can you walk comfortably? "a couple minutes"    Patient Stated Goals Brother in-law wants her to get stronger.    Currently in Pain? No/denies              [facility] PT Assessment - 11/09/20 0001      Assessment   Medical Diagnosis abnormal gait    Referring Provider (PT) Raelyn Number    Hand Dominance Right    Next MD Visit n/a    Prior Therapy no      Precautions   Precautions None      Restrictions   Weight Bearing Restrictions No      Balance Screen   Has the patient fallen in the past 6 months No      [place] residence    Living Arrangements Other relatives    Additional Comments 2 steps outside, 17 inside      Prior Function   Level of Independence --  patient has daily caregiver   Vocation Unemployed      Cognition   Overall Cognitive Status --   history of mental retardation.     Observation/Other Assessments   Observations patient apprehensive to walking independently. handheld assist to treatment room    Focus on Therapeutic Outcomes (FOTO)  FOTO not accurately assessed as family member filled out questionnaire.      Sensation   Light Touch Appears Intact      Coordination   Gross Motor Movements are Fluid and Coordinated Yes      Posture/Postural Control   Posture Comments slump posturing in sitting and standing      Strength   Overall Strength Comments LE strength grossly 3+/5 bilaterally      Transfers   Five time sit to stand comments  37 seconds BUE support      Ambulation/Gait   Ambulation Distance (Feet) 10 Feet    Assistive device 1 person hand held assist    Gait Pattern Step-to  pattern;Decreased step length - right;Decreased step length - left;Right foot flat;Left foot flat;Shuffle;Wide base of support;Poor foot clearance - left;Poor foot clearance - right      Balance   Balance Assessed Yes      Static Standing Balance   Static Standing - Comment/# of Minutes unable to complete SLS bilaterally, fearful to even attempt                      Objective measurements completed on examination: See above findings.       [place] Adult PT Treatment/Exercise - 11/09/20 0001      Self-Care   Self-Care Other Self-Care Comments    Other Self-Care Comments  see patient education                  PT Education - 11/09/20 1112    Education Details Education on current condition, POC, and HEP.    Person(s) Educated Patient;Other (comment)   brother in-law   Methods Explanation;Demonstration;Handout;Verbal cues    Comprehension Verbalized understanding;Returned demonstration;Verbal cues required;Need further instruction            PT Short Term Goals - 11/09/20 1106      PT SHORT TERM GOAL #1   Title Patient/family member will be independent and compliant with initial HEP.    Baseline issued at eval    Time 3    Period Weeks    Status New    Target Date 11/30/20      PT SHORT TERM GOAL #2   Title Patient will be able to complete sit to stand transfer without use of UE support    Baseline multiple attempts with use of BUE support    Time 3    Period Weeks    Status New    Target Date 12/07/20             PT Long Term Goals - 11/09/20 1136      PT LONG TERM GOAL #1   Title Patient will demonstrate at least 4/5 strength in BLE strength to improve stability with stair negotiation.    Baseline 3+/5    Time 6    Period Weeks    Status New    Target Date 12/21/20      PT LONG TERM GOAL #2   Title Patient will ambulate for at least 50 feet without assistance to improve ability for household ambulation.    Baseline handheld assist  in clinic.  Time 6    Period Weeks    Status New    Target Date 12/21/20      PT LONG TERM GOAL #3   Title Patient will complete 5xSTS in less than 25 seconds to signify improvements in functional strength.    Baseline 37 seconds    Time 6    Period Weeks    Status New    Target Date 12/21/20      PT LONG TERM GOAL #4   Title Patient/family member will be independent with advanced home program.    Baseline n/a    Time 6    Period Weeks    Status New    Target Date 12/21/20                  Plan - 11/09/20 1144    Clinical Impression Statement Patient is a 55 y/o female referred to OPPT for gait abnormalities that per patient's family member began about 7 months ago with unknown cause. Family member reports that patient is mostly sedentary and has difficulty with walking, stair negotiation, and transfers. Patient demonstrates gait abnormalities as well was fear/apprehension to ambulating, balance impairments, and BLE weakness. Patient's apprehension to performing specific tests/measures as well as her cognitive deficits will likely limit overall progress with PT, though will trial physical therapy in order to address above impairments in order to optimize function.    Personal Factors and Comorbidities Time since onset of injury/illness/exacerbation;Comorbidity 3+;Behavior Pattern    Comorbidities see subjective    Examination-Activity Limitations Carry;Lift;Stand;Stairs;Squat;Locomotion Level    Examination-Participation Restrictions Community Activity;Cleaning    Stability/Clinical Decision Making Stable/Uncomplicated    Clinical Decision Making Moderate    Rehab Potential Fair    PT Frequency 1x / week    PT Duration 6 weeks    PT Treatment/Interventions ADLs/Self Care Home Management;Therapeutic exercise;Balance training;Neuromuscular re-education;Therapeutic activities;Stair training;Gait training;Patient/family education    PT Next Visit Plan Review HEP. NuStep.  static balance activity. Seated/standing LE strength as tolerated.    PT Home Exercise Plan Access Code: [email or web address]    Consulted and Agree with Plan of Care Patient;Family member/caregiver    Family Member Consulted brother in-law           Patient will benefit from skilled therapeutic intervention in order to improve the following deficits and impairments:  Abnormal gait,Difficulty walking,Decreased endurance,Decreased activity tolerance,Decreased balance,Decreased mobility,Decreased strength,Postural dysfunction  Visit Diagnosis: Other abnormalities of gait and mobility  Muscle weakness (generalized)     Problem List Patient Active Problem List   Diagnosis Date Noted  . Anemia 12/06/2018  . Slurred speech 10/07/2017  . OSA (obstructive sleep apnea) 07/25/2017  . Seizure disorder ([place]) 04/22/2016  . End-stage renal disease on peritoneal dialysis ([place])   . Partial seizure ([place])   . Seizure ([place]) 09/14/2015  . Essential hypertension 10/11/2010  . FOOT PAIN, RIGHT 10/11/2010  . BRONCHITIS, ACUTE WITH MILD BRONCHOSPASM 10/04/2010  . ANKLE SPRAIN, RIGHT 02/20/2010  . WEIGHT GAIN 06/17/2009  . Dermatophytosis of foot 04/16/2009  . VAGINAL DISCHARGE 04/16/2009  . INSOMNIA 04/16/2009  . POLYDIPSIA 04/16/2009  . FREQUENCY, URINARY 04/16/2009  . ANEMIA, IRON DEFICIENCY 12/03/2007  . DYSLIPIDEMIA 06/11/2007  . OBESITY NOS 06/11/2007  . Schizoaffective disorder ([place]) 06/11/2007  . DISORDER, BIPOLAR NOS 06/11/2007  . RETARDATION, MENTAL NOS 06/11/2007  . RENAL INSUFFICIENCY, CHRONIC 06/11/2007  . ACNE NEC 06/11/2007  . URINARY INCONTINENCE, URGE 06/11/2007  . REDUCTION MAMMOPLASTY, HX OF 06/11/2007   Gwendolyn Grant, PT, DPT, ATC  11/09/20 12:19 PM  [place], [place], [address] Phone: [phone]   Fax:  [phone]  Name: Jane [name] MRN: [id number] Date of Birth: [birth year]

## 2020-11-10 DIAGNOSIS — N186 End stage renal disease: Secondary | ICD-10-CM | POA: Diagnosis not present

## 2020-11-10 DIAGNOSIS — D509 Iron deficiency anemia, unspecified: Secondary | ICD-10-CM | POA: Diagnosis not present

## 2020-11-10 DIAGNOSIS — N2581 Secondary hyperparathyroidism of renal origin: Secondary | ICD-10-CM | POA: Diagnosis not present

## 2020-11-10 DIAGNOSIS — I10 Essential (primary) hypertension: Secondary | ICD-10-CM | POA: Diagnosis not present

## 2020-11-10 DIAGNOSIS — D631 Anemia in chronic kidney disease: Secondary | ICD-10-CM | POA: Diagnosis not present

## 2020-11-10 DIAGNOSIS — T8571XA Infection and inflammatory reaction due to peritoneal dialysis catheter, initial encounter: Secondary | ICD-10-CM | POA: Diagnosis not present

## 2020-11-12 DIAGNOSIS — T8571XA Infection and inflammatory reaction due to peritoneal dialysis catheter, initial encounter: Secondary | ICD-10-CM | POA: Diagnosis not present

## 2020-11-12 DIAGNOSIS — D509 Iron deficiency anemia, unspecified: Secondary | ICD-10-CM | POA: Diagnosis not present

## 2020-11-12 DIAGNOSIS — N2581 Secondary hyperparathyroidism of renal origin: Secondary | ICD-10-CM | POA: Diagnosis not present

## 2020-11-12 DIAGNOSIS — D631 Anemia in chronic kidney disease: Secondary | ICD-10-CM | POA: Diagnosis not present

## 2020-11-12 DIAGNOSIS — I10 Essential (primary) hypertension: Secondary | ICD-10-CM | POA: Diagnosis not present

## 2020-11-12 DIAGNOSIS — N186 End stage renal disease: Secondary | ICD-10-CM | POA: Diagnosis not present

## 2020-11-15 DIAGNOSIS — N2581 Secondary hyperparathyroidism of renal origin: Secondary | ICD-10-CM | POA: Diagnosis not present

## 2020-11-15 DIAGNOSIS — D631 Anemia in chronic kidney disease: Secondary | ICD-10-CM | POA: Diagnosis not present

## 2020-11-15 DIAGNOSIS — T8571XA Infection and inflammatory reaction due to peritoneal dialysis catheter, initial encounter: Secondary | ICD-10-CM | POA: Diagnosis not present

## 2020-11-15 DIAGNOSIS — D509 Iron deficiency anemia, unspecified: Secondary | ICD-10-CM | POA: Diagnosis not present

## 2020-11-15 DIAGNOSIS — I10 Essential (primary) hypertension: Secondary | ICD-10-CM | POA: Diagnosis not present

## 2020-11-15 DIAGNOSIS — N186 End stage renal disease: Secondary | ICD-10-CM | POA: Diagnosis not present

## 2020-11-16 DIAGNOSIS — F250 Schizoaffective disorder, bipolar type: Secondary | ICD-10-CM | POA: Diagnosis not present

## 2020-11-16 DIAGNOSIS — F29 Unspecified psychosis not due to a substance or known physiological condition: Secondary | ICD-10-CM | POA: Diagnosis not present

## 2020-11-16 DIAGNOSIS — F319 Bipolar disorder, unspecified: Secondary | ICD-10-CM | POA: Diagnosis not present

## 2020-11-16 DIAGNOSIS — F71 Moderate intellectual disabilities: Secondary | ICD-10-CM | POA: Diagnosis not present

## 2020-11-17 DIAGNOSIS — T8571XA Infection and inflammatory reaction due to peritoneal dialysis catheter, initial encounter: Secondary | ICD-10-CM | POA: Diagnosis not present

## 2020-11-17 DIAGNOSIS — D631 Anemia in chronic kidney disease: Secondary | ICD-10-CM | POA: Diagnosis not present

## 2020-11-17 DIAGNOSIS — I10 Essential (primary) hypertension: Secondary | ICD-10-CM | POA: Diagnosis not present

## 2020-11-17 DIAGNOSIS — N186 End stage renal disease: Secondary | ICD-10-CM | POA: Diagnosis not present

## 2020-11-17 DIAGNOSIS — D509 Iron deficiency anemia, unspecified: Secondary | ICD-10-CM | POA: Diagnosis not present

## 2020-11-17 DIAGNOSIS — N2581 Secondary hyperparathyroidism of renal origin: Secondary | ICD-10-CM | POA: Diagnosis not present

## 2020-11-19 DIAGNOSIS — D509 Iron deficiency anemia, unspecified: Secondary | ICD-10-CM | POA: Diagnosis not present

## 2020-11-19 DIAGNOSIS — N2581 Secondary hyperparathyroidism of renal origin: Secondary | ICD-10-CM | POA: Diagnosis not present

## 2020-11-19 DIAGNOSIS — N186 End stage renal disease: Secondary | ICD-10-CM | POA: Diagnosis not present

## 2020-11-19 DIAGNOSIS — I10 Essential (primary) hypertension: Secondary | ICD-10-CM | POA: Diagnosis not present

## 2020-11-19 DIAGNOSIS — T8571XA Infection and inflammatory reaction due to peritoneal dialysis catheter, initial encounter: Secondary | ICD-10-CM | POA: Diagnosis not present

## 2020-11-19 DIAGNOSIS — D631 Anemia in chronic kidney disease: Secondary | ICD-10-CM | POA: Diagnosis not present

## 2020-11-22 DIAGNOSIS — N186 End stage renal disease: Secondary | ICD-10-CM | POA: Diagnosis not present

## 2020-11-22 DIAGNOSIS — I10 Essential (primary) hypertension: Secondary | ICD-10-CM | POA: Diagnosis not present

## 2020-11-22 DIAGNOSIS — D631 Anemia in chronic kidney disease: Secondary | ICD-10-CM | POA: Diagnosis not present

## 2020-11-22 DIAGNOSIS — Z992 Dependence on renal dialysis: Secondary | ICD-10-CM | POA: Diagnosis not present

## 2020-11-22 DIAGNOSIS — D509 Iron deficiency anemia, unspecified: Secondary | ICD-10-CM | POA: Diagnosis not present

## 2020-11-22 DIAGNOSIS — N2581 Secondary hyperparathyroidism of renal origin: Secondary | ICD-10-CM | POA: Diagnosis not present

## 2020-11-22 DIAGNOSIS — T8571XA Infection and inflammatory reaction due to peritoneal dialysis catheter, initial encounter: Secondary | ICD-10-CM | POA: Diagnosis not present

## 2020-11-23 DIAGNOSIS — I129 Hypertensive chronic kidney disease with stage 1 through stage 4 chronic kidney disease, or unspecified chronic kidney disease: Secondary | ICD-10-CM | POA: Diagnosis not present

## 2020-11-23 DIAGNOSIS — N189 Chronic kidney disease, unspecified: Secondary | ICD-10-CM | POA: Diagnosis not present

## 2020-11-24 DIAGNOSIS — D509 Iron deficiency anemia, unspecified: Secondary | ICD-10-CM | POA: Diagnosis not present

## 2020-11-24 DIAGNOSIS — D631 Anemia in chronic kidney disease: Secondary | ICD-10-CM | POA: Diagnosis not present

## 2020-11-24 DIAGNOSIS — N2581 Secondary hyperparathyroidism of renal origin: Secondary | ICD-10-CM | POA: Diagnosis not present

## 2020-11-24 DIAGNOSIS — N186 End stage renal disease: Secondary | ICD-10-CM | POA: Diagnosis not present

## 2020-11-25 DIAGNOSIS — Z01810 Encounter for preprocedural cardiovascular examination: Secondary | ICD-10-CM | POA: Diagnosis not present

## 2020-11-25 DIAGNOSIS — R9431 Abnormal electrocardiogram [ECG] [EKG]: Secondary | ICD-10-CM | POA: Diagnosis not present

## 2020-11-25 DIAGNOSIS — Z01812 Encounter for preprocedural laboratory examination: Secondary | ICD-10-CM | POA: Diagnosis not present

## 2020-11-25 DIAGNOSIS — N186 End stage renal disease: Secondary | ICD-10-CM | POA: Diagnosis not present

## 2020-11-26 DIAGNOSIS — D631 Anemia in chronic kidney disease: Secondary | ICD-10-CM | POA: Diagnosis not present

## 2020-11-26 DIAGNOSIS — N2581 Secondary hyperparathyroidism of renal origin: Secondary | ICD-10-CM | POA: Diagnosis not present

## 2020-11-26 DIAGNOSIS — N186 End stage renal disease: Secondary | ICD-10-CM | POA: Diagnosis not present

## 2020-11-26 DIAGNOSIS — D509 Iron deficiency anemia, unspecified: Secondary | ICD-10-CM | POA: Diagnosis not present

## 2020-11-29 ENCOUNTER — Other Ambulatory Visit: Payer: Self-pay | Admitting: Internal Medicine

## 2020-11-29 DIAGNOSIS — D509 Iron deficiency anemia, unspecified: Secondary | ICD-10-CM | POA: Diagnosis not present

## 2020-11-29 DIAGNOSIS — Z1231 Encounter for screening mammogram for malignant neoplasm of breast: Secondary | ICD-10-CM

## 2020-11-29 DIAGNOSIS — N186 End stage renal disease: Secondary | ICD-10-CM | POA: Diagnosis not present

## 2020-11-29 DIAGNOSIS — D631 Anemia in chronic kidney disease: Secondary | ICD-10-CM | POA: Diagnosis not present

## 2020-11-29 DIAGNOSIS — N2581 Secondary hyperparathyroidism of renal origin: Secondary | ICD-10-CM | POA: Diagnosis not present

## 2020-11-30 ENCOUNTER — Ambulatory Visit: Payer: Medicare Other

## 2020-12-01 DIAGNOSIS — D509 Iron deficiency anemia, unspecified: Secondary | ICD-10-CM | POA: Diagnosis not present

## 2020-12-01 DIAGNOSIS — N2581 Secondary hyperparathyroidism of renal origin: Secondary | ICD-10-CM | POA: Diagnosis not present

## 2020-12-01 DIAGNOSIS — D631 Anemia in chronic kidney disease: Secondary | ICD-10-CM | POA: Diagnosis not present

## 2020-12-01 DIAGNOSIS — N186 End stage renal disease: Secondary | ICD-10-CM | POA: Diagnosis not present

## 2020-12-02 DIAGNOSIS — N186 End stage renal disease: Secondary | ICD-10-CM | POA: Diagnosis not present

## 2020-12-02 DIAGNOSIS — D649 Anemia, unspecified: Secondary | ICD-10-CM | POA: Diagnosis not present

## 2020-12-02 DIAGNOSIS — G4733 Obstructive sleep apnea (adult) (pediatric): Secondary | ICD-10-CM | POA: Diagnosis not present

## 2020-12-02 DIAGNOSIS — E039 Hypothyroidism, unspecified: Secondary | ICD-10-CM | POA: Diagnosis not present

## 2020-12-02 DIAGNOSIS — N189 Chronic kidney disease, unspecified: Secondary | ICD-10-CM | POA: Diagnosis not present

## 2020-12-02 DIAGNOSIS — M109 Gout, unspecified: Secondary | ICD-10-CM | POA: Diagnosis not present

## 2020-12-02 DIAGNOSIS — F319 Bipolar disorder, unspecified: Secondary | ICD-10-CM | POA: Diagnosis not present

## 2020-12-02 DIAGNOSIS — K660 Peritoneal adhesions (postprocedural) (postinfection): Secondary | ICD-10-CM | POA: Diagnosis not present

## 2020-12-02 DIAGNOSIS — R6250 Unspecified lack of expected normal physiological development in childhood: Secondary | ICD-10-CM | POA: Diagnosis not present

## 2020-12-02 DIAGNOSIS — G40909 Epilepsy, unspecified, not intractable, without status epilepticus: Secondary | ICD-10-CM | POA: Diagnosis not present

## 2020-12-02 DIAGNOSIS — I120 Hypertensive chronic kidney disease with stage 5 chronic kidney disease or end stage renal disease: Secondary | ICD-10-CM | POA: Diagnosis not present

## 2020-12-02 DIAGNOSIS — Z992 Dependence on renal dialysis: Secondary | ICD-10-CM | POA: Diagnosis not present

## 2020-12-03 DIAGNOSIS — N186 End stage renal disease: Secondary | ICD-10-CM | POA: Diagnosis not present

## 2020-12-03 DIAGNOSIS — D509 Iron deficiency anemia, unspecified: Secondary | ICD-10-CM | POA: Diagnosis not present

## 2020-12-03 DIAGNOSIS — N2581 Secondary hyperparathyroidism of renal origin: Secondary | ICD-10-CM | POA: Diagnosis not present

## 2020-12-03 DIAGNOSIS — D631 Anemia in chronic kidney disease: Secondary | ICD-10-CM | POA: Diagnosis not present

## 2020-12-06 DIAGNOSIS — Z3042 Encounter for surveillance of injectable contraceptive: Secondary | ICD-10-CM | POA: Diagnosis not present

## 2020-12-06 DIAGNOSIS — D539 Nutritional anemia, unspecified: Secondary | ICD-10-CM | POA: Diagnosis not present

## 2020-12-06 DIAGNOSIS — N2581 Secondary hyperparathyroidism of renal origin: Secondary | ICD-10-CM | POA: Diagnosis not present

## 2020-12-06 DIAGNOSIS — K219 Gastro-esophageal reflux disease without esophagitis: Secondary | ICD-10-CM | POA: Diagnosis not present

## 2020-12-06 DIAGNOSIS — D509 Iron deficiency anemia, unspecified: Secondary | ICD-10-CM | POA: Diagnosis not present

## 2020-12-06 DIAGNOSIS — E039 Hypothyroidism, unspecified: Secondary | ICD-10-CM | POA: Diagnosis not present

## 2020-12-06 DIAGNOSIS — D631 Anemia in chronic kidney disease: Secondary | ICD-10-CM | POA: Diagnosis not present

## 2020-12-06 DIAGNOSIS — M5430 Sciatica, unspecified side: Secondary | ICD-10-CM | POA: Diagnosis not present

## 2020-12-06 DIAGNOSIS — R569 Unspecified convulsions: Secondary | ICD-10-CM | POA: Diagnosis not present

## 2020-12-06 DIAGNOSIS — R269 Unspecified abnormalities of gait and mobility: Secondary | ICD-10-CM | POA: Diagnosis not present

## 2020-12-06 DIAGNOSIS — E782 Mixed hyperlipidemia: Secondary | ICD-10-CM | POA: Diagnosis not present

## 2020-12-06 DIAGNOSIS — N186 End stage renal disease: Secondary | ICD-10-CM | POA: Diagnosis not present

## 2020-12-07 ENCOUNTER — Ambulatory Visit: Payer: Medicare Other

## 2020-12-08 DIAGNOSIS — D509 Iron deficiency anemia, unspecified: Secondary | ICD-10-CM | POA: Diagnosis not present

## 2020-12-08 DIAGNOSIS — D631 Anemia in chronic kidney disease: Secondary | ICD-10-CM | POA: Diagnosis not present

## 2020-12-08 DIAGNOSIS — N186 End stage renal disease: Secondary | ICD-10-CM | POA: Diagnosis not present

## 2020-12-08 DIAGNOSIS — N2581 Secondary hyperparathyroidism of renal origin: Secondary | ICD-10-CM | POA: Diagnosis not present

## 2020-12-10 DIAGNOSIS — N186 End stage renal disease: Secondary | ICD-10-CM | POA: Diagnosis not present

## 2020-12-10 DIAGNOSIS — D631 Anemia in chronic kidney disease: Secondary | ICD-10-CM | POA: Diagnosis not present

## 2020-12-10 DIAGNOSIS — N2581 Secondary hyperparathyroidism of renal origin: Secondary | ICD-10-CM | POA: Diagnosis not present

## 2020-12-10 DIAGNOSIS — D509 Iron deficiency anemia, unspecified: Secondary | ICD-10-CM | POA: Diagnosis not present

## 2020-12-13 DIAGNOSIS — D509 Iron deficiency anemia, unspecified: Secondary | ICD-10-CM | POA: Diagnosis not present

## 2020-12-13 DIAGNOSIS — N2581 Secondary hyperparathyroidism of renal origin: Secondary | ICD-10-CM | POA: Diagnosis not present

## 2020-12-13 DIAGNOSIS — N186 End stage renal disease: Secondary | ICD-10-CM | POA: Diagnosis not present

## 2020-12-13 DIAGNOSIS — D631 Anemia in chronic kidney disease: Secondary | ICD-10-CM | POA: Diagnosis not present

## 2020-12-14 ENCOUNTER — Ambulatory Visit: Payer: Medicare Other | Attending: Physician Assistant

## 2020-12-14 ENCOUNTER — Telehealth: Payer: Self-pay

## 2020-12-14 NOTE — Telephone Encounter (Signed)
PT spoke with patient's sister regarding no show after two consecutive cancellations and informed her of attendance policy. Pt's sister explains that patient's conditioned has worsened, and she has been having difficulty ambulating even within the house. Pt's sister stated she may take her to the hospital for further examination but would like to keep next therapy appointment until then. Discussed for pt/pt's sister to call to cancel if unable to make it and D/C per attendance policy if pt continues to cancel/no show. Advised pt's sister to contact pt's physician to inquire about further course of action or potential HHPT as an option if pt is limited within home and unable to leave safely.  Haydee Monica, PT, DPT 12/14/20 3:14 PM

## 2020-12-15 DIAGNOSIS — D631 Anemia in chronic kidney disease: Secondary | ICD-10-CM | POA: Diagnosis not present

## 2020-12-15 DIAGNOSIS — N186 End stage renal disease: Secondary | ICD-10-CM | POA: Diagnosis not present

## 2020-12-15 DIAGNOSIS — D509 Iron deficiency anemia, unspecified: Secondary | ICD-10-CM | POA: Diagnosis not present

## 2020-12-15 DIAGNOSIS — N2581 Secondary hyperparathyroidism of renal origin: Secondary | ICD-10-CM | POA: Diagnosis not present

## 2020-12-17 DIAGNOSIS — D509 Iron deficiency anemia, unspecified: Secondary | ICD-10-CM | POA: Diagnosis not present

## 2020-12-17 DIAGNOSIS — D631 Anemia in chronic kidney disease: Secondary | ICD-10-CM | POA: Diagnosis not present

## 2020-12-17 DIAGNOSIS — N2581 Secondary hyperparathyroidism of renal origin: Secondary | ICD-10-CM | POA: Diagnosis not present

## 2020-12-17 DIAGNOSIS — N186 End stage renal disease: Secondary | ICD-10-CM | POA: Diagnosis not present

## 2020-12-20 DIAGNOSIS — D509 Iron deficiency anemia, unspecified: Secondary | ICD-10-CM | POA: Diagnosis not present

## 2020-12-20 DIAGNOSIS — N186 End stage renal disease: Secondary | ICD-10-CM | POA: Diagnosis not present

## 2020-12-20 DIAGNOSIS — N2581 Secondary hyperparathyroidism of renal origin: Secondary | ICD-10-CM | POA: Diagnosis not present

## 2020-12-20 DIAGNOSIS — D631 Anemia in chronic kidney disease: Secondary | ICD-10-CM | POA: Diagnosis not present

## 2020-12-20 DIAGNOSIS — Z992 Dependence on renal dialysis: Secondary | ICD-10-CM | POA: Diagnosis not present

## 2020-12-21 ENCOUNTER — Ambulatory Visit: Payer: Medicare Other

## 2020-12-22 DIAGNOSIS — D509 Iron deficiency anemia, unspecified: Secondary | ICD-10-CM | POA: Diagnosis not present

## 2020-12-22 DIAGNOSIS — D631 Anemia in chronic kidney disease: Secondary | ICD-10-CM | POA: Diagnosis not present

## 2020-12-22 DIAGNOSIS — N186 End stage renal disease: Secondary | ICD-10-CM | POA: Diagnosis not present

## 2020-12-22 DIAGNOSIS — N2581 Secondary hyperparathyroidism of renal origin: Secondary | ICD-10-CM | POA: Diagnosis not present

## 2020-12-24 DIAGNOSIS — M89751 Major osseous defect, right pelvic region and thigh: Secondary | ICD-10-CM | POA: Diagnosis not present

## 2020-12-24 DIAGNOSIS — M879 Osteonecrosis, unspecified: Secondary | ICD-10-CM | POA: Diagnosis not present

## 2020-12-24 DIAGNOSIS — M545 Low back pain, unspecified: Secondary | ICD-10-CM | POA: Diagnosis not present

## 2020-12-24 DIAGNOSIS — Z992 Dependence on renal dialysis: Secondary | ICD-10-CM | POA: Diagnosis not present

## 2020-12-24 DIAGNOSIS — M47817 Spondylosis without myelopathy or radiculopathy, lumbosacral region: Secondary | ICD-10-CM | POA: Diagnosis not present

## 2020-12-24 DIAGNOSIS — N184 Chronic kidney disease, stage 4 (severe): Secondary | ICD-10-CM | POA: Diagnosis not present

## 2020-12-24 DIAGNOSIS — R6250 Unspecified lack of expected normal physiological development in childhood: Secondary | ICD-10-CM | POA: Diagnosis not present

## 2020-12-24 DIAGNOSIS — N186 End stage renal disease: Secondary | ICD-10-CM | POA: Diagnosis not present

## 2020-12-24 DIAGNOSIS — M89752 Major osseous defect, left pelvic region and thigh: Secondary | ICD-10-CM | POA: Diagnosis not present

## 2020-12-25 DIAGNOSIS — N186 End stage renal disease: Secondary | ICD-10-CM | POA: Diagnosis not present

## 2020-12-25 DIAGNOSIS — D631 Anemia in chronic kidney disease: Secondary | ICD-10-CM | POA: Diagnosis not present

## 2020-12-25 DIAGNOSIS — N2581 Secondary hyperparathyroidism of renal origin: Secondary | ICD-10-CM | POA: Diagnosis not present

## 2020-12-26 DIAGNOSIS — D631 Anemia in chronic kidney disease: Secondary | ICD-10-CM | POA: Diagnosis not present

## 2020-12-26 DIAGNOSIS — N2581 Secondary hyperparathyroidism of renal origin: Secondary | ICD-10-CM | POA: Diagnosis not present

## 2020-12-26 DIAGNOSIS — N186 End stage renal disease: Secondary | ICD-10-CM | POA: Diagnosis not present

## 2020-12-27 DIAGNOSIS — M5430 Sciatica, unspecified side: Secondary | ICD-10-CM | POA: Diagnosis not present

## 2020-12-27 DIAGNOSIS — E039 Hypothyroidism, unspecified: Secondary | ICD-10-CM | POA: Diagnosis not present

## 2020-12-27 DIAGNOSIS — N2581 Secondary hyperparathyroidism of renal origin: Secondary | ICD-10-CM | POA: Diagnosis not present

## 2020-12-27 DIAGNOSIS — R569 Unspecified convulsions: Secondary | ICD-10-CM | POA: Diagnosis not present

## 2020-12-27 DIAGNOSIS — E782 Mixed hyperlipidemia: Secondary | ICD-10-CM | POA: Diagnosis not present

## 2020-12-27 DIAGNOSIS — Z3042 Encounter for surveillance of injectable contraceptive: Secondary | ICD-10-CM | POA: Diagnosis not present

## 2020-12-27 DIAGNOSIS — Z0001 Encounter for general adult medical examination with abnormal findings: Secondary | ICD-10-CM | POA: Diagnosis not present

## 2020-12-27 DIAGNOSIS — D631 Anemia in chronic kidney disease: Secondary | ICD-10-CM | POA: Diagnosis not present

## 2020-12-27 DIAGNOSIS — N186 End stage renal disease: Secondary | ICD-10-CM | POA: Diagnosis not present

## 2020-12-27 DIAGNOSIS — D539 Nutritional anemia, unspecified: Secondary | ICD-10-CM | POA: Diagnosis not present

## 2020-12-28 DIAGNOSIS — N186 End stage renal disease: Secondary | ICD-10-CM | POA: Diagnosis not present

## 2020-12-28 DIAGNOSIS — D631 Anemia in chronic kidney disease: Secondary | ICD-10-CM | POA: Diagnosis not present

## 2020-12-28 DIAGNOSIS — N2581 Secondary hyperparathyroidism of renal origin: Secondary | ICD-10-CM | POA: Diagnosis not present

## 2020-12-29 DIAGNOSIS — D631 Anemia in chronic kidney disease: Secondary | ICD-10-CM | POA: Diagnosis not present

## 2020-12-29 DIAGNOSIS — N186 End stage renal disease: Secondary | ICD-10-CM | POA: Diagnosis not present

## 2020-12-29 DIAGNOSIS — N2581 Secondary hyperparathyroidism of renal origin: Secondary | ICD-10-CM | POA: Diagnosis not present

## 2020-12-30 DIAGNOSIS — D631 Anemia in chronic kidney disease: Secondary | ICD-10-CM | POA: Diagnosis not present

## 2020-12-30 DIAGNOSIS — N186 End stage renal disease: Secondary | ICD-10-CM | POA: Diagnosis not present

## 2020-12-30 DIAGNOSIS — N2581 Secondary hyperparathyroidism of renal origin: Secondary | ICD-10-CM | POA: Diagnosis not present

## 2020-12-31 DIAGNOSIS — N2581 Secondary hyperparathyroidism of renal origin: Secondary | ICD-10-CM | POA: Diagnosis not present

## 2020-12-31 DIAGNOSIS — D631 Anemia in chronic kidney disease: Secondary | ICD-10-CM | POA: Diagnosis not present

## 2020-12-31 DIAGNOSIS — N186 End stage renal disease: Secondary | ICD-10-CM | POA: Diagnosis not present

## 2021-01-01 DIAGNOSIS — D631 Anemia in chronic kidney disease: Secondary | ICD-10-CM | POA: Diagnosis not present

## 2021-01-01 DIAGNOSIS — N2581 Secondary hyperparathyroidism of renal origin: Secondary | ICD-10-CM | POA: Diagnosis not present

## 2021-01-01 DIAGNOSIS — N186 End stage renal disease: Secondary | ICD-10-CM | POA: Diagnosis not present

## 2021-01-02 DIAGNOSIS — N2581 Secondary hyperparathyroidism of renal origin: Secondary | ICD-10-CM | POA: Diagnosis not present

## 2021-01-02 DIAGNOSIS — D631 Anemia in chronic kidney disease: Secondary | ICD-10-CM | POA: Diagnosis not present

## 2021-01-02 DIAGNOSIS — N186 End stage renal disease: Secondary | ICD-10-CM | POA: Diagnosis not present

## 2021-01-03 DIAGNOSIS — N186 End stage renal disease: Secondary | ICD-10-CM | POA: Diagnosis not present

## 2021-01-03 DIAGNOSIS — D631 Anemia in chronic kidney disease: Secondary | ICD-10-CM | POA: Diagnosis not present

## 2021-01-03 DIAGNOSIS — N2581 Secondary hyperparathyroidism of renal origin: Secondary | ICD-10-CM | POA: Diagnosis not present

## 2021-01-04 DIAGNOSIS — D631 Anemia in chronic kidney disease: Secondary | ICD-10-CM | POA: Diagnosis not present

## 2021-01-04 DIAGNOSIS — Z992 Dependence on renal dialysis: Secondary | ICD-10-CM | POA: Diagnosis not present

## 2021-01-04 DIAGNOSIS — N2581 Secondary hyperparathyroidism of renal origin: Secondary | ICD-10-CM | POA: Diagnosis not present

## 2021-01-04 DIAGNOSIS — N186 End stage renal disease: Secondary | ICD-10-CM | POA: Diagnosis not present

## 2021-01-05 DIAGNOSIS — H2513 Age-related nuclear cataract, bilateral: Secondary | ICD-10-CM | POA: Diagnosis not present

## 2021-01-05 DIAGNOSIS — H029 Unspecified disorder of eyelid: Secondary | ICD-10-CM | POA: Diagnosis not present

## 2021-01-05 DIAGNOSIS — N186 End stage renal disease: Secondary | ICD-10-CM | POA: Diagnosis not present

## 2021-01-05 DIAGNOSIS — N2581 Secondary hyperparathyroidism of renal origin: Secondary | ICD-10-CM | POA: Diagnosis not present

## 2021-01-05 DIAGNOSIS — D631 Anemia in chronic kidney disease: Secondary | ICD-10-CM | POA: Diagnosis not present

## 2021-01-06 DIAGNOSIS — N186 End stage renal disease: Secondary | ICD-10-CM | POA: Diagnosis not present

## 2021-01-06 DIAGNOSIS — N2581 Secondary hyperparathyroidism of renal origin: Secondary | ICD-10-CM | POA: Diagnosis not present

## 2021-01-06 DIAGNOSIS — D631 Anemia in chronic kidney disease: Secondary | ICD-10-CM | POA: Diagnosis not present

## 2021-01-07 DIAGNOSIS — N2581 Secondary hyperparathyroidism of renal origin: Secondary | ICD-10-CM | POA: Diagnosis not present

## 2021-01-07 DIAGNOSIS — D631 Anemia in chronic kidney disease: Secondary | ICD-10-CM | POA: Diagnosis not present

## 2021-01-07 DIAGNOSIS — N186 End stage renal disease: Secondary | ICD-10-CM | POA: Diagnosis not present

## 2021-01-08 DIAGNOSIS — N2581 Secondary hyperparathyroidism of renal origin: Secondary | ICD-10-CM | POA: Diagnosis not present

## 2021-01-08 DIAGNOSIS — D631 Anemia in chronic kidney disease: Secondary | ICD-10-CM | POA: Diagnosis not present

## 2021-01-08 DIAGNOSIS — N186 End stage renal disease: Secondary | ICD-10-CM | POA: Diagnosis not present

## 2021-01-09 DIAGNOSIS — D631 Anemia in chronic kidney disease: Secondary | ICD-10-CM | POA: Diagnosis not present

## 2021-01-09 DIAGNOSIS — N186 End stage renal disease: Secondary | ICD-10-CM | POA: Diagnosis not present

## 2021-01-09 DIAGNOSIS — N2581 Secondary hyperparathyroidism of renal origin: Secondary | ICD-10-CM | POA: Diagnosis not present

## 2021-01-10 DIAGNOSIS — N2581 Secondary hyperparathyroidism of renal origin: Secondary | ICD-10-CM | POA: Diagnosis not present

## 2021-01-10 DIAGNOSIS — D631 Anemia in chronic kidney disease: Secondary | ICD-10-CM | POA: Diagnosis not present

## 2021-01-10 DIAGNOSIS — N186 End stage renal disease: Secondary | ICD-10-CM | POA: Diagnosis not present

## 2021-01-11 ENCOUNTER — Encounter: Payer: Self-pay | Admitting: *Deleted

## 2021-01-11 DIAGNOSIS — N2581 Secondary hyperparathyroidism of renal origin: Secondary | ICD-10-CM | POA: Diagnosis not present

## 2021-01-11 DIAGNOSIS — N186 End stage renal disease: Secondary | ICD-10-CM | POA: Diagnosis not present

## 2021-01-11 DIAGNOSIS — D631 Anemia in chronic kidney disease: Secondary | ICD-10-CM | POA: Diagnosis not present

## 2021-01-12 ENCOUNTER — Ambulatory Visit (INDEPENDENT_AMBULATORY_CARE_PROVIDER_SITE_OTHER): Payer: Medicare Other | Admitting: Diagnostic Neuroimaging

## 2021-01-12 ENCOUNTER — Other Ambulatory Visit: Payer: Self-pay

## 2021-01-12 ENCOUNTER — Encounter: Payer: Self-pay | Admitting: Diagnostic Neuroimaging

## 2021-01-12 ENCOUNTER — Telehealth: Payer: Self-pay | Admitting: Diagnostic Neuroimaging

## 2021-01-12 VITALS — BP 122/83 | HR 101

## 2021-01-12 DIAGNOSIS — D631 Anemia in chronic kidney disease: Secondary | ICD-10-CM | POA: Diagnosis not present

## 2021-01-12 DIAGNOSIS — M4807 Spinal stenosis, lumbosacral region: Secondary | ICD-10-CM | POA: Diagnosis not present

## 2021-01-12 DIAGNOSIS — G959 Disease of spinal cord, unspecified: Secondary | ICD-10-CM | POA: Diagnosis not present

## 2021-01-12 DIAGNOSIS — R531 Weakness: Secondary | ICD-10-CM

## 2021-01-12 DIAGNOSIS — R269 Unspecified abnormalities of gait and mobility: Secondary | ICD-10-CM

## 2021-01-12 DIAGNOSIS — N186 End stage renal disease: Secondary | ICD-10-CM | POA: Diagnosis not present

## 2021-01-12 DIAGNOSIS — R52 Pain, unspecified: Secondary | ICD-10-CM

## 2021-01-12 DIAGNOSIS — N2581 Secondary hyperparathyroidism of renal origin: Secondary | ICD-10-CM | POA: Diagnosis not present

## 2021-01-12 NOTE — Progress Notes (Signed)
[facility]  PATIENT: Jane [name] DOB: [birth year]  REFERRING CLINICIAN: [name], MD HISTORY FROM: patient  REASON FOR VISIT: new consult    HISTORICAL  CHIEF COMPLAINT:  Chief Complaint  Patient presents with  . Gait Problem    Rm 6 New Pt  brother in law- Jori Moll  "using a wheelchair for past month stopped walking said her legs hurt"    HISTORY OF PRESENT ILLNESS:   55 year old female here for evaluation of gait difficulty.  Patient has history of schizophrenia, intellectual disability, bipolar disorder, schizophrenia, hypertension, hyperlipidemia, end-stage renal disease on dialysis, here for elevation of gait difficulty and pain.  Patient was diagnosed with mental retardation intellectual disability in sixth grade.  She was transferred to living in a group homes after that.  She was living in a group home until 2013, then was planning to transition to her own home with help of 24-hour caregiver.  At that time patient sister and brother-in-law decided to have her moved in with them.  They have been taking care of her since that time.  She has had some issues with gait and balance difficulty since then.  Symptoms have significant worse in the last 1 to 2 months.  Now patient complaining of pain in her arms, neck, low back and legs.  She is using a wheelchair more often now.  She is getting weaker over time.   REVIEW OF SYSTEMS: Full 14 system review of systems performed and negative with exception of: As per HPI.  ALLERGIES: Allergies  Allergen Reactions  . Icodextrin Rash    HOME MEDICATIONS: Outpatient Medications Prior to Visit  Medication Sig Dispense Refill  . B Complex-C-Folic Acid (DIALYVITE 233) 0.8 MG TABS Take 0.8 mg by mouth daily.     . benztropine (COGENTIN) 1 MG tablet Take 1 mg by mouth at bedtime.    . cinacalcet (SENSIPAR) 30 MG tablet Take 30-60 mg by mouth See admin instructions. Take 1 tablet (30 mg) by mouth with  breakfast and 2 tablets (60 mg) with supper    . divalproex (DEPAKOTE) 500 MG DR tablet Take 500 mg by mouth 2 (two) times daily.    Mariane Baumgarten Sodium (DSS) 100 MG CAPS Take by mouth as needed.    Marland Kitchen FLUoxetine (PROZAC) 20 MG capsule TAKE 1 CAPSULE BY MOUTH AFTER BREAKFAST    . iron polysaccharides (NIFEREX) 150 MG capsule Take 150 mg by mouth every other day.    . levETIRAcetam (KEPPRA) 500 MG tablet Take 1 tablet (500 mg total) by mouth 2 (two) times daily. 60 tablet 3  . levothyroxine (SYNTHROID, LEVOTHROID) 50 MCG tablet Take 50 mcg by mouth daily with breakfast.  0  . losartan (COZAAR) 25 MG tablet TAKE 1 TABLET(25 MG) BY MOUTH DAILY    . omeprazole (PRILOSEC) 40 MG capsule TAKE 1 CAPSULE(40 MG) BY MOUTH DAILY    . potassium chloride SA (K-DUR,KLOR-CON) 20 MEQ tablet Take 20 mEq by mouth daily with breakfast.     . risperiDONE (RISPERDAL) 2 MG tablet Take 2 mg by mouth at bedtime.    . sevelamer carbonate (RENVELA) 800 MG tablet Take 800 mg by mouth See admin instructions. Take 1 tablet (800 mg) by mouth with meals and snacks    . vitamin B-12 (CYANOCOBALAMIN) 500 MCG tablet Take 500 mcg by mouth daily.    Marland Kitchen albuterol (PROVENTIL HFA;VENTOLIN HFA) 108 (90 Base) MCG/ACT inhaler Inhale 2 puffs into the lungs every 6 (six) hours as needed  for wheezing or shortness of breath. (Patient not taking: Reported on 01/12/2021)    . medroxyPROGESTERone (DEPO-PROVERA) 150 MG/ML injection Inject 150 mg into the muscle every 3 (three) months.  (Patient not taking: Reported on 01/12/2021)     No facility-administered medications prior to visit.    PAST MEDICAL HISTORY: Past Medical History:  Diagnosis Date  . Abnormal gait   . Anemia   . Bipolar disorder ([place])   . Depression   . End stage renal disease ([place])   . ESRD on peritoneal dialysis ([place])   . GERD (gastroesophageal reflux disease)   . Hyperlipemia   . Hypertension   . Hypothyroidism   . Moderately mentally retarded   . Renal disease   .  Schizophrenia ([place])   . Seizures ([place])    No recent seizures - no meds    PAST SURGICAL HISTORY: Past Surgical History:  Procedure Laterality Date  . BREAST REDUCTION SURGERY    . BREAST SURGERY     breast reduction  . EXAMINATION UNDER ANESTHESIA N/A 12/08/2013   Procedure: EXAM UNDER ANESTHESIA;  Surgeon: Lavonia Drafts, MD;  Location: [facility];  Service: Gynecology;  Laterality: N/A;  Pelvic exam and pap smear, unable to tolerate during last office visit  . FOOT SURGERY     right  . REDUCTION MAMMAPLASTY Bilateral     FAMILY HISTORY: Family History  Problem Relation Age of Onset  . Hypertension Other   . Breast cancer Mother 10    SOCIAL HISTORY: Social History   Socioeconomic History  . Marital status: Single    Spouse name: Not on file  . Number of children: 0  . Years of education: Not on file  . Highest education level: Not on file  Occupational History  . Not on file  Tobacco Use  . Smoking status: Never Smoker  . Smokeless tobacco: Never Used  Substance and Sexual Activity  . Alcohol use: No  . Drug use: No  . Sexual activity: Never    Birth control/protection: Injection  Other Topics Concern  . Not on file  Social History Narrative   01/12/21 lives with sister and brother in law   Social Determinants of Health   Financial Resource Strain: Not on file  Food Insecurity: Not on file  Transportation Needs: Not on file  Physical Activity: Not on file  Stress: Not on file  Social Connections: Not on file  Intimate Partner Violence: Not on file     PHYSICAL EXAM  GENERAL EXAM/CONSTITUTIONAL: Vitals:  Vitals:   01/12/21 1142  BP: 122/83  Pulse: (!) 101   There is no height or weight on file to calculate BMI. Wt Readings from Last 3 Encounters:  12/09/18 159 lb 12 oz (72.5 kg)  01/13/18 165 lb (74.8 kg)  10/09/17 166 lb 7.2 oz (75.5 kg)    Patient is in no distress; well developed, nourished and groomed; neck is  supple  CARDIOVASCULAR:  Examination of carotid arteries is normal; no carotid bruits  Regular rate and rhythm, no murmurs  Examination of peripheral vascular system by observation and palpation is normal  EYES:  Ophthalmoscopic exam of optic discs and posterior segments is normal; no papilledema or hemorrhages No exam data present  MUSCULOSKELETAL:  Gait, strength, tone, movements noted in Neurologic exam below  NEUROLOGIC: MENTAL STATUS:  No flowsheet data found.  awake, alert, oriented to person  recent and remote memory intact  normal attention and concentration  language fluent, comprehension intact, naming intact  fund of knowledge appropriate  SLURRED SPEECH  CRANIAL NERVE:   2nd - no papilledema on fundoscopic exam  2nd, 3rd, 4th, 6th - pupils equal and reactive to light, visual fields full to confrontation, extraocular muscles intact, no nystagmus  5th - facial sensation symmetric  7th - facial strength symmetric  8th - hearing intact  9th - palate elevates symmetrically, uvula midline  11th - shoulder shrug symmetric  12th - tongue protrusion midline  MOTOR:   normal bulk and tone; BUE 2-3 PROX, 4 DISTAL; LIMITED BY PAIN IN SHOULDERS AND ELBOWS; BLE 2-3 LIMITED BY PAIN  SENSORY:   normal and symmetric to light touch, temperature, vibration  COORDINATION:   finger-nose-finger, fine finger movements normal  REFLEXES:   deep tendon reflexes trace and symmetric  GAIT/STATION:   IN WHEELCHAIR; CANNOT STAND     DIAGNOSTIC DATA (LABS, IMAGING, TESTING) - I reviewed patient records, labs, notes, testing and imaging myself where available.  Lab Results  Component Value Date   WBC 6.1 12/09/2018   HGB 7.5 (L) 12/09/2018   HCT 24.4 (L) 12/09/2018   MCV 103.4 (H) 12/09/2018   PLT 320 12/09/2018      Component Value Date/Time   NA 136 12/09/2018 0835   K 4.0 12/09/2018 0835   CL 93 (L) 12/09/2018 0835   CO2 28 12/09/2018 0835    GLUCOSE 77 12/09/2018 0835   BUN 37 (H) 12/09/2018 0835   CREATININE 10.57 (HH) 12/09/2018 0835   CALCIUM 9.3 12/09/2018 0835   PROT 7.2 12/09/2018 0835   ALBUMIN 3.5 12/09/2018 0835   AST 9 (L) 12/09/2018 0835   ALT <5 12/09/2018 0835   ALKPHOS 204 (H) 12/09/2018 0835   BILITOT 0.3 12/09/2018 0835   GFRNONAA 4 (L) 12/09/2018 0835   GFRAA 4 (L) 12/09/2018 0835   Lab Results  Component Value Date   CHOL 149 10/08/2017   HDL 36 (L) 10/08/2017   LDLCALC 73 10/08/2017   TRIG 201 (H) 10/08/2017   CHOLHDL 4.1 10/08/2017   Lab Results  Component Value Date   HGBA1C 4.7 (L) 10/08/2017   Lab Results  Component Value Date   VITAMINB12 2,405 (H) 12/09/2018   Lab Results  Component Value Date   TSH 1.138 09/14/2015     10/08/2017 MRI brain - Atrophy. Minor white matter disease. No acute intracranial findings. No cause is seen for the reported symptoms.  ASSESSMENT AND PLAN  55 y.o. year old female here with:  Dx:  1. Weakness   2. Pain   3. Gait difficulty   4. Disease of spinal cord ([place])   5. Spinal stenosis of lumbosacral region       PLAN:  GAIT DIFF / WEAKNESS (new onset x 2 months; progressive, severe) - check MRI cervical and lumbar spine (rule out cervical myelopathy and lumbar spinal stenosis)  SEIZURE DISORDER - stable on VPA + LEV  Orders Placed This Encounter  Procedures  . MR CERVICAL SPINE WO CONTRAST  . MR LUMBAR SPINE WO CONTRAST   Return for pending if symptoms worsen or fail to improve.    Penni Bombard, MD 2/77/4128, 78:67 PM Certified in Neurology, Neurophysiology and Neuroimaging  [facility] [address], [place],  [address] [phone]

## 2021-01-12 NOTE — Telephone Encounter (Signed)
Medicare/medicaid order sent to GI. No auth they will reach out to the patient to schedule.  °

## 2021-01-13 DIAGNOSIS — D631 Anemia in chronic kidney disease: Secondary | ICD-10-CM | POA: Diagnosis not present

## 2021-01-13 DIAGNOSIS — N2581 Secondary hyperparathyroidism of renal origin: Secondary | ICD-10-CM | POA: Diagnosis not present

## 2021-01-13 DIAGNOSIS — N186 End stage renal disease: Secondary | ICD-10-CM | POA: Diagnosis not present

## 2021-01-14 DIAGNOSIS — N2581 Secondary hyperparathyroidism of renal origin: Secondary | ICD-10-CM | POA: Diagnosis not present

## 2021-01-14 DIAGNOSIS — N186 End stage renal disease: Secondary | ICD-10-CM | POA: Diagnosis not present

## 2021-01-14 DIAGNOSIS — D631 Anemia in chronic kidney disease: Secondary | ICD-10-CM | POA: Diagnosis not present

## 2021-01-15 DIAGNOSIS — D631 Anemia in chronic kidney disease: Secondary | ICD-10-CM | POA: Diagnosis not present

## 2021-01-15 DIAGNOSIS — N186 End stage renal disease: Secondary | ICD-10-CM | POA: Diagnosis not present

## 2021-01-15 DIAGNOSIS — N2581 Secondary hyperparathyroidism of renal origin: Secondary | ICD-10-CM | POA: Diagnosis not present

## 2021-01-16 DIAGNOSIS — N2581 Secondary hyperparathyroidism of renal origin: Secondary | ICD-10-CM | POA: Diagnosis not present

## 2021-01-16 DIAGNOSIS — N186 End stage renal disease: Secondary | ICD-10-CM | POA: Diagnosis not present

## 2021-01-16 DIAGNOSIS — D631 Anemia in chronic kidney disease: Secondary | ICD-10-CM | POA: Diagnosis not present

## 2021-01-17 ENCOUNTER — Inpatient Hospital Stay: Admission: RE | Admit: 2021-01-17 | Payer: Medicare Other | Source: Ambulatory Visit

## 2021-01-17 DIAGNOSIS — D631 Anemia in chronic kidney disease: Secondary | ICD-10-CM | POA: Diagnosis not present

## 2021-01-17 DIAGNOSIS — N186 End stage renal disease: Secondary | ICD-10-CM | POA: Diagnosis not present

## 2021-01-17 DIAGNOSIS — N2581 Secondary hyperparathyroidism of renal origin: Secondary | ICD-10-CM | POA: Diagnosis not present

## 2021-01-18 DIAGNOSIS — N186 End stage renal disease: Secondary | ICD-10-CM | POA: Diagnosis not present

## 2021-01-18 DIAGNOSIS — N2581 Secondary hyperparathyroidism of renal origin: Secondary | ICD-10-CM | POA: Diagnosis not present

## 2021-01-18 DIAGNOSIS — D631 Anemia in chronic kidney disease: Secondary | ICD-10-CM | POA: Diagnosis not present

## 2021-01-19 DIAGNOSIS — N186 End stage renal disease: Secondary | ICD-10-CM | POA: Diagnosis not present

## 2021-01-19 DIAGNOSIS — D631 Anemia in chronic kidney disease: Secondary | ICD-10-CM | POA: Diagnosis not present

## 2021-01-19 DIAGNOSIS — N2581 Secondary hyperparathyroidism of renal origin: Secondary | ICD-10-CM | POA: Diagnosis not present

## 2021-01-20 DIAGNOSIS — D631 Anemia in chronic kidney disease: Secondary | ICD-10-CM | POA: Diagnosis not present

## 2021-01-20 DIAGNOSIS — Z992 Dependence on renal dialysis: Secondary | ICD-10-CM | POA: Diagnosis not present

## 2021-01-20 DIAGNOSIS — N186 End stage renal disease: Secondary | ICD-10-CM | POA: Diagnosis not present

## 2021-01-20 DIAGNOSIS — N2581 Secondary hyperparathyroidism of renal origin: Secondary | ICD-10-CM | POA: Diagnosis not present

## 2021-01-21 DIAGNOSIS — D631 Anemia in chronic kidney disease: Secondary | ICD-10-CM | POA: Diagnosis not present

## 2021-01-21 DIAGNOSIS — N2581 Secondary hyperparathyroidism of renal origin: Secondary | ICD-10-CM | POA: Diagnosis not present

## 2021-01-21 DIAGNOSIS — N186 End stage renal disease: Secondary | ICD-10-CM | POA: Diagnosis not present

## 2021-01-22 ENCOUNTER — Inpatient Hospital Stay: Admission: RE | Admit: 2021-01-22 | Payer: Medicare Other | Source: Ambulatory Visit

## 2021-01-22 ENCOUNTER — Other Ambulatory Visit: Payer: Medicare Other

## 2021-01-22 DIAGNOSIS — D631 Anemia in chronic kidney disease: Secondary | ICD-10-CM | POA: Diagnosis not present

## 2021-01-22 DIAGNOSIS — N186 End stage renal disease: Secondary | ICD-10-CM | POA: Diagnosis not present

## 2021-01-22 DIAGNOSIS — N2581 Secondary hyperparathyroidism of renal origin: Secondary | ICD-10-CM | POA: Diagnosis not present

## 2021-01-23 DIAGNOSIS — N2581 Secondary hyperparathyroidism of renal origin: Secondary | ICD-10-CM | POA: Diagnosis not present

## 2021-01-23 DIAGNOSIS — N186 End stage renal disease: Secondary | ICD-10-CM | POA: Diagnosis not present

## 2021-01-23 DIAGNOSIS — D631 Anemia in chronic kidney disease: Secondary | ICD-10-CM | POA: Diagnosis not present

## 2021-01-24 DIAGNOSIS — N186 End stage renal disease: Secondary | ICD-10-CM | POA: Diagnosis not present

## 2021-01-24 DIAGNOSIS — D631 Anemia in chronic kidney disease: Secondary | ICD-10-CM | POA: Diagnosis not present

## 2021-01-24 DIAGNOSIS — N2581 Secondary hyperparathyroidism of renal origin: Secondary | ICD-10-CM | POA: Diagnosis not present

## 2021-01-25 ENCOUNTER — Inpatient Hospital Stay (HOSPITAL_COMMUNITY): Payer: Medicare Other

## 2021-01-25 ENCOUNTER — Other Ambulatory Visit: Payer: Self-pay

## 2021-01-25 ENCOUNTER — Inpatient Hospital Stay (HOSPITAL_COMMUNITY): Payer: Medicare Other | Admitting: Registered Nurse

## 2021-01-25 ENCOUNTER — Encounter (HOSPITAL_COMMUNITY): Payer: Self-pay | Admitting: Emergency Medicine

## 2021-01-25 ENCOUNTER — Emergency Department (HOSPITAL_COMMUNITY): Payer: Medicare Other

## 2021-01-25 ENCOUNTER — Inpatient Hospital Stay (HOSPITAL_COMMUNITY)
Admission: EM | Admit: 2021-01-25 | Discharge: 2021-02-04 | DRG: 028 | Disposition: A | Discharge status: Home Health | Payer: Medicare Other | Attending: Family Medicine | Admitting: Family Medicine

## 2021-01-25 ENCOUNTER — Inpatient Hospital Stay (HOSPITAL_COMMUNITY)
Admission: EM | Disposition: A | Discharge status: Home Health | Payer: Self-pay | Source: Home / Self Care | Attending: Internal Medicine

## 2021-01-25 DIAGNOSIS — F79 Unspecified intellectual disabilities: Secondary | ICD-10-CM

## 2021-01-25 DIAGNOSIS — G40909 Epilepsy, unspecified, not intractable, without status epilepticus: Secondary | ICD-10-CM | POA: Diagnosis present

## 2021-01-25 DIAGNOSIS — G4089 Other seizures: Secondary | ICD-10-CM | POA: Diagnosis not present

## 2021-01-25 DIAGNOSIS — I10 Essential (primary) hypertension: Secondary | ICD-10-CM | POA: Diagnosis not present

## 2021-01-25 DIAGNOSIS — E785 Hyperlipidemia, unspecified: Secondary | ICD-10-CM | POA: Diagnosis present

## 2021-01-25 DIAGNOSIS — M4626 Osteomyelitis of vertebra, lumbar region: Secondary | ICD-10-CM | POA: Diagnosis not present

## 2021-01-25 DIAGNOSIS — M89511 Osteolysis, right shoulder: Secondary | ICD-10-CM | POA: Diagnosis not present

## 2021-01-25 DIAGNOSIS — M2560 Stiffness of unspecified joint, not elsewhere classified: Secondary | ICD-10-CM

## 2021-01-25 DIAGNOSIS — Z452 Encounter for adjustment and management of vascular access device: Secondary | ICD-10-CM | POA: Diagnosis not present

## 2021-01-25 DIAGNOSIS — F259 Schizoaffective disorder, unspecified: Secondary | ICD-10-CM | POA: Diagnosis not present

## 2021-01-25 DIAGNOSIS — R1084 Generalized abdominal pain: Secondary | ICD-10-CM | POA: Diagnosis not present

## 2021-01-25 DIAGNOSIS — Z20822 Contact with and (suspected) exposure to covid-19: Secondary | ICD-10-CM | POA: Diagnosis not present

## 2021-01-25 DIAGNOSIS — M25411 Effusion, right shoulder: Secondary | ICD-10-CM | POA: Diagnosis not present

## 2021-01-25 DIAGNOSIS — B957 Other staphylococcus as the cause of diseases classified elsewhere: Secondary | ICD-10-CM | POA: Diagnosis not present

## 2021-01-25 DIAGNOSIS — M549 Dorsalgia, unspecified: Secondary | ICD-10-CM

## 2021-01-25 DIAGNOSIS — M4647 Discitis, unspecified, lumbosacral region: Secondary | ICD-10-CM | POA: Diagnosis present

## 2021-01-25 DIAGNOSIS — N2581 Secondary hyperparathyroidism of renal origin: Secondary | ICD-10-CM | POA: Diagnosis present

## 2021-01-25 DIAGNOSIS — D649 Anemia, unspecified: Secondary | ICD-10-CM

## 2021-01-25 DIAGNOSIS — L89159 Pressure ulcer of sacral region, unspecified stage: Secondary | ICD-10-CM | POA: Diagnosis not present

## 2021-01-25 DIAGNOSIS — N186 End stage renal disease: Secondary | ICD-10-CM | POA: Diagnosis not present

## 2021-01-25 DIAGNOSIS — K5900 Constipation, unspecified: Secondary | ICD-10-CM | POA: Diagnosis present

## 2021-01-25 DIAGNOSIS — D62 Acute posthemorrhagic anemia: Secondary | ICD-10-CM | POA: Diagnosis not present

## 2021-01-25 DIAGNOSIS — Z8249 Family history of ischemic heart disease and other diseases of the circulatory system: Secondary | ICD-10-CM

## 2021-01-25 DIAGNOSIS — Z992 Dependence on renal dialysis: Secondary | ICD-10-CM | POA: Diagnosis not present

## 2021-01-25 DIAGNOSIS — F319 Bipolar disorder, unspecified: Secondary | ICD-10-CM | POA: Diagnosis present

## 2021-01-25 DIAGNOSIS — L8990 Pressure ulcer of unspecified site, unspecified stage: Secondary | ICD-10-CM | POA: Insufficient documentation

## 2021-01-25 DIAGNOSIS — Z79899 Other long term (current) drug therapy: Secondary | ICD-10-CM

## 2021-01-25 DIAGNOSIS — Z888 Allergy status to other drugs, medicaments and biological substances status: Secondary | ICD-10-CM | POA: Diagnosis not present

## 2021-01-25 DIAGNOSIS — E876 Hypokalemia: Secondary | ICD-10-CM | POA: Diagnosis present

## 2021-01-25 DIAGNOSIS — E039 Hypothyroidism, unspecified: Secondary | ICD-10-CM | POA: Diagnosis present

## 2021-01-25 DIAGNOSIS — M009 Pyogenic arthritis, unspecified: Secondary | ICD-10-CM | POA: Diagnosis not present

## 2021-01-25 DIAGNOSIS — L89152 Pressure ulcer of sacral region, stage 2: Secondary | ICD-10-CM | POA: Diagnosis present

## 2021-01-25 DIAGNOSIS — D509 Iron deficiency anemia, unspecified: Secondary | ICD-10-CM | POA: Diagnosis present

## 2021-01-25 DIAGNOSIS — G061 Intraspinal abscess and granuloma: Principal | ICD-10-CM | POA: Diagnosis present

## 2021-01-25 DIAGNOSIS — F71 Moderate intellectual disabilities: Secondary | ICD-10-CM | POA: Diagnosis present

## 2021-01-25 DIAGNOSIS — L89309 Pressure ulcer of unspecified buttock, unspecified stage: Secondary | ICD-10-CM | POA: Diagnosis not present

## 2021-01-25 DIAGNOSIS — B9561 Methicillin susceptible Staphylococcus aureus infection as the cause of diseases classified elsewhere: Secondary | ICD-10-CM | POA: Diagnosis not present

## 2021-01-25 DIAGNOSIS — M19011 Primary osteoarthritis, right shoulder: Secondary | ICD-10-CM | POA: Diagnosis present

## 2021-01-25 DIAGNOSIS — Z79890 Hormone replacement therapy (postmenopausal): Secondary | ICD-10-CM | POA: Diagnosis not present

## 2021-01-25 DIAGNOSIS — M4640 Discitis, unspecified, site unspecified: Secondary | ICD-10-CM | POA: Diagnosis not present

## 2021-01-25 DIAGNOSIS — M4627 Osteomyelitis of vertebra, lumbosacral region: Secondary | ICD-10-CM | POA: Diagnosis not present

## 2021-01-25 DIAGNOSIS — E8889 Other specified metabolic disorders: Secondary | ICD-10-CM | POA: Diagnosis present

## 2021-01-25 DIAGNOSIS — I120 Hypertensive chronic kidney disease with stage 5 chronic kidney disease or end stage renal disease: Secondary | ICD-10-CM | POA: Diagnosis not present

## 2021-01-25 DIAGNOSIS — F941 Reactive attachment disorder of childhood: Secondary | ICD-10-CM | POA: Diagnosis not present

## 2021-01-25 DIAGNOSIS — D631 Anemia in chronic kidney disease: Secondary | ICD-10-CM | POA: Diagnosis present

## 2021-01-25 DIAGNOSIS — M25511 Pain in right shoulder: Secondary | ICD-10-CM | POA: Diagnosis not present

## 2021-01-25 DIAGNOSIS — Z803 Family history of malignant neoplasm of breast: Secondary | ICD-10-CM | POA: Diagnosis not present

## 2021-01-25 DIAGNOSIS — M545 Low back pain, unspecified: Secondary | ICD-10-CM | POA: Diagnosis not present

## 2021-01-25 DIAGNOSIS — Z9889 Other specified postprocedural states: Secondary | ICD-10-CM | POA: Diagnosis not present

## 2021-01-25 DIAGNOSIS — N250 Renal osteodystrophy: Secondary | ICD-10-CM | POA: Diagnosis not present

## 2021-01-25 DIAGNOSIS — G062 Extradural and subdural abscess, unspecified: Secondary | ICD-10-CM | POA: Diagnosis not present

## 2021-01-25 DIAGNOSIS — R4586 Emotional lability: Secondary | ICD-10-CM | POA: Diagnosis present

## 2021-01-25 DIAGNOSIS — B9689 Other specified bacterial agents as the cause of diseases classified elsewhere: Secondary | ICD-10-CM | POA: Diagnosis not present

## 2021-01-25 DIAGNOSIS — G4733 Obstructive sleep apnea (adult) (pediatric): Secondary | ICD-10-CM | POA: Diagnosis not present

## 2021-01-25 DIAGNOSIS — J9811 Atelectasis: Secondary | ICD-10-CM | POA: Diagnosis not present

## 2021-01-25 DIAGNOSIS — R109 Unspecified abdominal pain: Secondary | ICD-10-CM | POA: Diagnosis not present

## 2021-01-25 DIAGNOSIS — I878 Other specified disorders of veins: Secondary | ICD-10-CM

## 2021-01-25 DIAGNOSIS — R531 Weakness: Secondary | ICD-10-CM

## 2021-01-25 DIAGNOSIS — Z981 Arthrodesis status: Secondary | ICD-10-CM | POA: Diagnosis not present

## 2021-01-25 DIAGNOSIS — M00011 Staphylococcal arthritis, right shoulder: Secondary | ICD-10-CM | POA: Diagnosis not present

## 2021-01-25 DIAGNOSIS — R0902 Hypoxemia: Secondary | ICD-10-CM | POA: Diagnosis not present

## 2021-01-25 DIAGNOSIS — S4991XA Unspecified injury of right shoulder and upper arm, initial encounter: Secondary | ICD-10-CM | POA: Diagnosis not present

## 2021-01-25 DIAGNOSIS — Z419 Encounter for procedure for purposes other than remedying health state, unspecified: Secondary | ICD-10-CM

## 2021-01-25 HISTORY — PX: LUMBAR LAMINECTOMY/DECOMPRESSION MICRODISCECTOMY: SHX5026

## 2021-01-25 LAB — COMPREHENSIVE METABOLIC PANEL
ALT: 11 U/L (ref 0–44)
AST: 14 U/L — ABNORMAL LOW (ref 15–41)
Albumin: 1.7 g/dL — ABNORMAL LOW (ref 3.5–5.0)
Alkaline Phosphatase: 319 U/L — ABNORMAL HIGH (ref 38–126)
Anion gap: 17 — ABNORMAL HIGH (ref 5–15)
BUN: 64 mg/dL — ABNORMAL HIGH (ref 6–20)
CO2: 26 mmol/L (ref 22–32)
Calcium: 7.5 mg/dL — ABNORMAL LOW (ref 8.9–10.3)
Chloride: 86 mmol/L — ABNORMAL LOW (ref 98–111)
Creatinine, Ser: 7.01 mg/dL — ABNORMAL HIGH (ref 0.44–1.00)
GFR, Estimated: 6 mL/min — ABNORMAL LOW (ref >60)
Glucose, Bld: 88 mg/dL (ref 70–99)
Potassium: 2.9 mmol/L — ABNORMAL LOW (ref 3.5–5.1)
Sodium: 129 mmol/L — ABNORMAL LOW (ref 135–145)
Total Bilirubin: 0.7 mg/dL (ref 0.3–1.2)
Total Protein: 7.8 g/dL (ref 6.5–8.1)

## 2021-01-25 LAB — CBC WITH DIFFERENTIAL/PLATELET
Abs Immature Granulocytes: 0.08 10*3/uL — ABNORMAL HIGH (ref 0.00–0.07)
Basophils Absolute: 0 10*3/uL (ref 0.0–0.1)
Basophils Relative: 0 %
Eosinophils Absolute: 0 10*3/uL (ref 0.0–0.5)
Eosinophils Relative: 0 %
HCT: 25.6 % — ABNORMAL LOW (ref 36.0–46.0)
Hemoglobin: 8.2 g/dL — ABNORMAL LOW (ref 12.0–15.0)
Immature Granulocytes: 1 %
Lymphocytes Relative: 9 %
Lymphs Abs: 1.1 10*3/uL (ref 0.7–4.0)
MCH: 32 pg (ref 26.0–34.0)
MCHC: 32 g/dL (ref 30.0–36.0)
MCV: 100 fL (ref 80.0–100.0)
Monocytes Absolute: 0.4 10*3/uL (ref 0.1–1.0)
Monocytes Relative: 4 %
Neutro Abs: 9.7 10*3/uL — ABNORMAL HIGH (ref 1.7–7.7)
Neutrophils Relative %: 86 %
Platelets: 440 10*3/uL — ABNORMAL HIGH (ref 150–400)
RBC: 2.56 MIL/uL — ABNORMAL LOW (ref 3.87–5.11)
RDW: 14.2 % (ref 11.5–15.5)
WBC: 11.3 10*3/uL — ABNORMAL HIGH (ref 4.0–10.5)
nRBC: 0 % (ref 0.0–0.2)

## 2021-01-25 LAB — RESP PANEL BY RT-PCR (FLU A&B, COVID) ARPGX2
Influenza A by PCR: NEGATIVE
Influenza B by PCR: NEGATIVE
SARS Coronavirus 2 by RT PCR: NEGATIVE

## 2021-01-25 LAB — LIPASE, BLOOD: Lipase: 38 U/L (ref 11–51)

## 2021-01-25 LAB — ABO/RH: ABO/RH(D): O POS

## 2021-01-25 LAB — TSH: TSH: 1.096 u[IU]/mL (ref 0.350–4.500)

## 2021-01-25 LAB — HEMOGLOBIN A1C
Hgb A1c MFr Bld: 5.6 % (ref 4.8–5.6)
Mean Plasma Glucose: 114.02 mg/dL

## 2021-01-25 LAB — HIV ANTIBODY (ROUTINE TESTING W REFLEX): HIV Screen 4th Generation wRfx: NONREACTIVE

## 2021-01-25 LAB — PREPARE RBC (CROSSMATCH)

## 2021-01-25 SURGERY — LUMBAR LAMINECTOMY/DECOMPRESSION MICRODISCECTOMY 1 LEVEL
Anesthesia: General | Site: Spine Lumbar

## 2021-01-25 MED ORDER — HYDROCODONE-ACETAMINOPHEN 5-325 MG PO TABS
1.0000 | ORAL_TABLET | ORAL | Status: DC | PRN
Start: 2021-01-25 — End: 2021-01-26
  Administered 2021-01-25: 1 via ORAL
  Filled 2021-01-25 (×2): qty 1

## 2021-01-25 MED ORDER — LIDOCAINE 2% (20 MG/ML) 5 ML SYRINGE
INTRAMUSCULAR | Status: DC | PRN
  Administered 2021-01-25: 40 mg via INTRAVENOUS

## 2021-01-25 MED ORDER — SODIUM CHLORIDE 0.9 % IV SOLN
2.0000 g | Freq: Two times a day (BID) | INTRAVENOUS | Status: DC
  Administered 2021-01-25 – 2021-01-27 (×4): 2 g via INTRAVENOUS
  Filled 2021-01-25 (×4): qty 20

## 2021-01-25 MED ORDER — EPHEDRINE SULFATE-NACL 50-0.9 MG/10ML-% IV SOSY
PREFILLED_SYRINGE | INTRAVENOUS | Status: DC | PRN
  Administered 2021-01-25: 5 mg via INTRAVENOUS

## 2021-01-25 MED ORDER — IOHEXOL 300 MG/ML  SOLN
100.0000 mL | Freq: Once | INTRAMUSCULAR | Status: AC | PRN
  Administered 2021-01-25: 100 mL via INTRAVENOUS

## 2021-01-25 MED ORDER — CEFTRIAXONE SODIUM 2 G IJ SOLR
2.0000 g | INTRAMUSCULAR | Status: DC
  Filled 2021-01-25: qty 20

## 2021-01-25 MED ORDER — ACETAMINOPHEN 650 MG RE SUPP: 650.0000 mg | RECTAL | Status: DC | PRN

## 2021-01-25 MED ORDER — ROCURONIUM BROMIDE 10 MG/ML (PF) SYRINGE
PREFILLED_SYRINGE | INTRAVENOUS | Status: DC | PRN
  Administered 2021-01-25: 50 mg via INTRAVENOUS

## 2021-01-25 MED ORDER — FENTANYL CITRATE (PF) 100 MCG/2ML IJ SOLN
INTRAMUSCULAR | Status: AC
  Filled 2021-01-25: qty 2

## 2021-01-25 MED ORDER — FLUOXETINE HCL 20 MG PO CAPS
20.0000 mg | ORAL_CAPSULE | Freq: Every day | ORAL | Status: DC
  Administered 2021-01-25 – 2021-02-04 (×10): 20 mg via ORAL
  Filled 2021-01-25 (×10): qty 1

## 2021-01-25 MED ORDER — SODIUM CHLORIDE 0.9 % IV SOLN: INTRAVENOUS | Status: DC | PRN

## 2021-01-25 MED ORDER — HYDROCODONE-ACETAMINOPHEN 10-325 MG PO TABS
2.0000 | ORAL_TABLET | ORAL | Status: DC | PRN
Start: 2021-01-25 — End: 2021-01-26

## 2021-01-25 MED ORDER — VANCOMYCIN HCL 1000 MG IV SOLR
INTRAVENOUS | Status: AC
  Filled 2021-01-25: qty 1000

## 2021-01-25 MED ORDER — PROPOFOL 10 MG/ML IV BOLUS
INTRAVENOUS | Status: DC | PRN
  Administered 2021-01-25: 120 mg via INTRAVENOUS
  Administered 2021-01-25: 20 mg via INTRAVENOUS

## 2021-01-25 MED ORDER — KETOROLAC TROMETHAMINE 15 MG/ML IJ SOLN
30.0000 mg | Freq: Four times a day (QID) | INTRAMUSCULAR | Status: AC
  Administered 2021-01-25 – 2021-01-26 (×4): 30 mg via INTRAVENOUS
  Filled 2021-01-25 (×4): qty 2

## 2021-01-25 MED ORDER — POLYETHYLENE GLYCOL 3350 17 G PO PACK
17.0000 g | PACK | Freq: Every day | ORAL | Status: DC | PRN
  Administered 2021-01-29: 17 g via ORAL
  Filled 2021-01-25: qty 1

## 2021-01-25 MED ORDER — BENZTROPINE MESYLATE 1 MG PO TABS
1.0000 mg | ORAL_TABLET | Freq: Every day | ORAL | Status: DC
  Administered 2021-01-25 – 2021-02-02 (×8): 1 mg via ORAL
  Filled 2021-01-25 (×11): qty 1

## 2021-01-25 MED ORDER — HYDROMORPHONE HCL 1 MG/ML IJ SOLN
1.0000 mg | INTRAMUSCULAR | Status: DC | PRN
Start: 2021-01-25 — End: 2021-02-05
  Administered 2021-01-26: 1 mg via INTRAVENOUS
  Filled 2021-01-25: qty 1

## 2021-01-25 MED ORDER — FENTANYL CITRATE (PF) 100 MCG/2ML IJ SOLN
50.0000 ug | Freq: Once | INTRAMUSCULAR | Status: AC
Start: 2021-01-25 — End: 2021-01-25
  Administered 2021-01-25: 50 ug via INTRAVENOUS
  Filled 2021-01-25: qty 2

## 2021-01-25 MED ORDER — CYCLOBENZAPRINE HCL 10 MG PO TABS
10.0000 mg | ORAL_TABLET | Freq: Three times a day (TID) | ORAL | Status: DC | PRN
Start: 2021-01-25 — End: 2021-02-05
  Administered 2021-01-27: 10 mg via ORAL
  Filled 2021-01-25: qty 1

## 2021-01-25 MED ORDER — LEVETIRACETAM 500 MG PO TABS
500.0000 mg | ORAL_TABLET | Freq: Two times a day (BID) | ORAL | Status: DC
  Administered 2021-01-25 – 2021-02-04 (×18): 500 mg via ORAL
  Filled 2021-01-25 (×20): qty 1

## 2021-01-25 MED ORDER — HEPARIN 1000 UNIT/ML FOR PERITONEAL DIALYSIS: 500.0000 [IU] | INTRAMUSCULAR | Status: DC | PRN

## 2021-01-25 MED ORDER — SUCCINYLCHOLINE CHLORIDE 200 MG/10ML IV SOSY
PREFILLED_SYRINGE | INTRAVENOUS | Status: AC
  Filled 2021-01-25: qty 10

## 2021-01-25 MED ORDER — SODIUM CHLORIDE 0.9 % IV SOLN
2.0000 g | Freq: Once | INTRAVENOUS | Status: AC
  Administered 2021-01-25: 2 g via INTRAVENOUS
  Filled 2021-01-25: qty 20

## 2021-01-25 MED ORDER — PHENYLEPHRINE 40 MCG/ML (10ML) SYRINGE FOR IV PUSH (FOR BLOOD PRESSURE SUPPORT)
PREFILLED_SYRINGE | INTRAVENOUS | Status: AC
  Filled 2021-01-25: qty 10

## 2021-01-25 MED ORDER — CINACALCET HCL 30 MG PO TABS
60.0000 mg | ORAL_TABLET | Freq: Every day | ORAL | Status: DC
  Administered 2021-01-25 – 2021-01-26 (×2): 60 mg via ORAL
  Filled 2021-01-25 (×2): qty 2

## 2021-01-25 MED ORDER — ALBUTEROL SULFATE (2.5 MG/3ML) 0.083% IN NEBU
2.5000 mg | INHALATION_SOLUTION | Freq: Four times a day (QID) | RESPIRATORY_TRACT | Status: DC | PRN
  Administered 2021-01-30: 2.5 mg via RESPIRATORY_TRACT
  Filled 2021-01-25: qty 3

## 2021-01-25 MED ORDER — DEXAMETHASONE SODIUM PHOSPHATE 10 MG/ML IJ SOLN
INTRAMUSCULAR | Status: DC | PRN
  Administered 2021-01-25: 10 mg via INTRAVENOUS

## 2021-01-25 MED ORDER — HEPARIN 1000 UNIT/ML FOR PERITONEAL DIALYSIS
INTRAPERITONEAL | Status: DC | PRN
  Filled 2021-01-25 (×7): qty 5000

## 2021-01-25 MED ORDER — ONDANSETRON HCL 4 MG/2ML IJ SOLN: 4.0000 mg | Freq: Once | INTRAMUSCULAR | Status: DC | PRN

## 2021-01-25 MED ORDER — SODIUM CHLORIDE 0.9% FLUSH
3.0000 mL | Freq: Two times a day (BID) | INTRAVENOUS | Status: DC
  Administered 2021-01-26: 3 mL via INTRAVENOUS

## 2021-01-25 MED ORDER — EPHEDRINE 5 MG/ML INJ
INTRAVENOUS | Status: AC
  Filled 2021-01-25: qty 10

## 2021-01-25 MED ORDER — PHENOL 1.4 % MT LIQD: 1.0000 | OROMUCOSAL | Status: DC | PRN

## 2021-01-25 MED ORDER — FENTANYL CITRATE (PF) 250 MCG/5ML IJ SOLN
INTRAMUSCULAR | Status: AC
  Filled 2021-01-25: qty 5

## 2021-01-25 MED ORDER — GENTAMICIN SULFATE 0.1 % EX CREA
1.0000 "application " | TOPICAL_CREAM | Freq: Every day | CUTANEOUS | Status: DC
  Administered 2021-01-25 – 2021-02-04 (×11): 1 via TOPICAL
  Filled 2021-01-25: qty 15

## 2021-01-25 MED ORDER — SODIUM CHLORIDE 0.9 % IV SOLN: 250.0000 mL | INTRAVENOUS | Status: DC

## 2021-01-25 MED ORDER — THROMBIN 20000 UNITS EX KIT
PACK | CUTANEOUS | Status: AC
  Filled 2021-01-25: qty 1

## 2021-01-25 MED ORDER — PHENYLEPHRINE HCL-NACL 10-0.9 MG/250ML-% IV SOLN
INTRAVENOUS | Status: DC | PRN
  Administered 2021-01-25: 25 ug/min via INTRAVENOUS

## 2021-01-25 MED ORDER — ONDANSETRON HCL 4 MG/2ML IJ SOLN
INTRAMUSCULAR | Status: DC | PRN
  Administered 2021-01-25: 4 mg via INTRAVENOUS

## 2021-01-25 MED ORDER — POTASSIUM CHLORIDE CRYS ER 20 MEQ PO TBCR
30.0000 meq | EXTENDED_RELEASE_TABLET | Freq: Three times a day (TID) | ORAL | Status: AC
  Administered 2021-01-25 (×2): 30 meq via ORAL
  Filled 2021-01-25 (×2): qty 1

## 2021-01-25 MED ORDER — CHLORHEXIDINE GLUCONATE CLOTH 2 % EX PADS
6.0000 | MEDICATED_PAD | Freq: Every day | CUTANEOUS | Status: DC
  Administered 2021-01-26 – 2021-02-04 (×9): 6 via TOPICAL

## 2021-01-25 MED ORDER — DOCUSATE SODIUM 100 MG PO CAPS
100.0000 mg | ORAL_CAPSULE | Freq: Two times a day (BID) | ORAL | Status: DC
  Administered 2021-01-25 – 2021-02-04 (×17): 100 mg via ORAL
  Filled 2021-01-25 (×19): qty 1

## 2021-01-25 MED ORDER — BUPIVACAINE HCL (PF) 0.25 % IJ SOLN
INTRAMUSCULAR | Status: AC
  Filled 2021-01-25: qty 30

## 2021-01-25 MED ORDER — BISACODYL 5 MG PO TBEC
5.0000 mg | DELAYED_RELEASE_TABLET | Freq: Every day | ORAL | Status: DC | PRN
  Administered 2021-01-27 – 2021-01-31 (×4): 5 mg via ORAL
  Filled 2021-01-25 (×5): qty 1

## 2021-01-25 MED ORDER — DIVALPROEX SODIUM 500 MG PO DR TAB
500.0000 mg | DELAYED_RELEASE_TABLET | Freq: Two times a day (BID) | ORAL | Status: DC
  Administered 2021-01-25 – 2021-01-29 (×8): 500 mg via ORAL
  Filled 2021-01-25 (×10): qty 1

## 2021-01-25 MED ORDER — PHENYLEPHRINE 40 MCG/ML (10ML) SYRINGE FOR IV PUSH (FOR BLOOD PRESSURE SUPPORT): PREFILLED_SYRINGE | INTRAVENOUS | Status: DC | PRN

## 2021-01-25 MED ORDER — FENTANYL CITRATE (PF) 250 MCG/5ML IJ SOLN
INTRAMUSCULAR | Status: DC | PRN
  Administered 2021-01-25 (×2): 50 ug via INTRAVENOUS

## 2021-01-25 MED ORDER — LEVOTHYROXINE SODIUM 50 MCG PO TABS
50.0000 ug | ORAL_TABLET | Freq: Every day | ORAL | Status: DC
  Administered 2021-01-25 – 2021-02-04 (×10): 50 ug via ORAL
  Filled 2021-01-25 (×10): qty 1

## 2021-01-25 MED ORDER — VANCOMYCIN HCL 1000 MG IV SOLR
INTRAVENOUS | Status: DC | PRN
  Administered 2021-01-25: 1000 mg

## 2021-01-25 MED ORDER — VANCOMYCIN HCL 1500 MG/300ML IV SOLN
1500.0000 mg | Freq: Once | INTRAVENOUS | Status: AC
  Administered 2021-01-25: 1500 mg via INTRAVENOUS
  Filled 2021-01-25: qty 300

## 2021-01-25 MED ORDER — 0.9 % SODIUM CHLORIDE (POUR BTL) OPTIME
TOPICAL | Status: DC | PRN
  Administered 2021-01-25: 1000 mL

## 2021-01-25 MED ORDER — SODIUM CHLORIDE 0.9% FLUSH: 10.0000 mL | INTRAVENOUS | Status: DC | PRN

## 2021-01-25 MED ORDER — FLEET ENEMA 7-19 GM/118ML RE ENEM: 1.0000 | ENEMA | Freq: Every day | RECTAL | Status: DC | PRN

## 2021-01-25 MED ORDER — SODIUM CHLORIDE 0.9% FLUSH: 3.0000 mL | INTRAVENOUS | Status: DC | PRN

## 2021-01-25 MED ORDER — OXYCODONE HCL 5 MG/5ML PO SOLN
5.0000 mg | Freq: Once | ORAL | Status: DC | PRN
Start: 2021-01-25 — End: 2021-01-25

## 2021-01-25 MED ORDER — SUGAMMADEX SODIUM 200 MG/2ML IV SOLN
INTRAVENOUS | Status: DC | PRN
  Administered 2021-01-25: 100 mg via INTRAVENOUS
  Administered 2021-01-25: 200 mg via INTRAVENOUS

## 2021-01-25 MED ORDER — LIDOCAINE 2% (20 MG/ML) 5 ML SYRINGE
INTRAMUSCULAR | Status: AC
  Filled 2021-01-25: qty 5

## 2021-01-25 MED ORDER — FENTANYL CITRATE (PF) 100 MCG/2ML IJ SOLN
25.0000 ug | INTRAMUSCULAR | Status: DC | PRN
  Administered 2021-01-25 (×2): 50 ug via INTRAVENOUS

## 2021-01-25 MED ORDER — DSS 100 MG PO CAPS: 100.0000 mg | ORAL_CAPSULE | Freq: Every day | ORAL | Status: DC | PRN

## 2021-01-25 MED ORDER — SEVELAMER CARBONATE 800 MG PO TABS
800.0000 mg | ORAL_TABLET | Freq: Two times a day (BID) | ORAL | Status: DC
  Administered 2021-01-25 – 2021-02-04 (×17): 800 mg via ORAL
  Filled 2021-01-25 (×21): qty 1

## 2021-01-25 MED ORDER — PANTOPRAZOLE SODIUM 40 MG PO TBEC
40.0000 mg | DELAYED_RELEASE_TABLET | Freq: Every day | ORAL | Status: DC
  Administered 2021-01-25 – 2021-02-04 (×10): 40 mg via ORAL
  Filled 2021-01-25 (×10): qty 1

## 2021-01-25 MED ORDER — ONDANSETRON HCL 4 MG PO TABS: 4.0000 mg | ORAL_TABLET | Freq: Four times a day (QID) | ORAL | Status: DC | PRN

## 2021-01-25 MED ORDER — PROPOFOL 10 MG/ML IV BOLUS
INTRAVENOUS | Status: AC
  Filled 2021-01-25: qty 20

## 2021-01-25 MED ORDER — FENTANYL CITRATE (PF) 100 MCG/2ML IJ SOLN: 25.0000 ug | INTRAMUSCULAR | Status: DC | PRN

## 2021-01-25 MED ORDER — THROMBIN 20000 UNITS EX SOLR: CUTANEOUS | Status: DC | PRN

## 2021-01-25 MED ORDER — VITAMIN B-12 1000 MCG PO TABS
500.0000 ug | ORAL_TABLET | Freq: Every day | ORAL | Status: DC
  Administered 2021-01-25 – 2021-02-04 (×10): 500 ug via ORAL
  Filled 2021-01-25 (×11): qty 1

## 2021-01-25 MED ORDER — POLYSACCHARIDE IRON COMPLEX 150 MG PO CAPS
150.0000 mg | ORAL_CAPSULE | ORAL | Status: DC
  Administered 2021-01-26 – 2021-02-03 (×4): 150 mg via ORAL
  Filled 2021-01-25 (×6): qty 1

## 2021-01-25 MED ORDER — ONDANSETRON HCL 4 MG/2ML IJ SOLN: 4.0000 mg | Freq: Four times a day (QID) | INTRAMUSCULAR | Status: DC | PRN

## 2021-01-25 MED ORDER — ROCURONIUM BROMIDE 10 MG/ML (PF) SYRINGE
PREFILLED_SYRINGE | INTRAVENOUS | Status: AC
  Filled 2021-01-25: qty 10

## 2021-01-25 MED ORDER — CINACALCET HCL 30 MG PO TABS
30.0000 mg | ORAL_TABLET | Freq: Every day | ORAL | Status: DC
  Administered 2021-01-26 – 2021-01-27 (×2): 30 mg via ORAL
  Filled 2021-01-25 (×2): qty 1

## 2021-01-25 MED ORDER — POTASSIUM CHLORIDE CRYS ER 20 MEQ PO TBCR
20.0000 meq | EXTENDED_RELEASE_TABLET | Freq: Every day | ORAL | Status: DC
  Administered 2021-01-25: 20 meq via ORAL
  Filled 2021-01-25 (×2): qty 1

## 2021-01-25 MED ORDER — THROMBIN 5000 UNITS EX SOLR: OROMUCOSAL | Status: DC | PRN

## 2021-01-25 MED ORDER — VANCOMYCIN HCL 500 MG/100ML IV SOLN
500.0000 mg | Freq: Once | INTRAVENOUS | Status: AC
  Administered 2021-01-25: 500 mg via INTRAVENOUS
  Filled 2021-01-25: qty 100

## 2021-01-25 MED ORDER — SUCCINYLCHOLINE CHLORIDE 200 MG/10ML IV SOSY
PREFILLED_SYRINGE | INTRAVENOUS | Status: DC | PRN
  Administered 2021-01-25: 100 mg via INTRAVENOUS

## 2021-01-25 MED ORDER — RISPERIDONE 2 MG PO TABS
2.0000 mg | ORAL_TABLET | Freq: Every day | ORAL | Status: DC
  Administered 2021-01-26 – 2021-02-02 (×8): 2 mg via ORAL
  Filled 2021-01-25 (×12): qty 1

## 2021-01-25 MED ORDER — MENTHOL 3 MG MT LOZG: 1.0000 | LOZENGE | OROMUCOSAL | Status: DC | PRN

## 2021-01-25 MED ORDER — PHENYLEPHRINE 40 MCG/ML (10ML) SYRINGE FOR IV PUSH (FOR BLOOD PRESSURE SUPPORT)
PREFILLED_SYRINGE | INTRAVENOUS | Status: DC | PRN
  Administered 2021-01-25: 120 ug via INTRAVENOUS
  Administered 2021-01-25: 80 ug via INTRAVENOUS

## 2021-01-25 MED ORDER — SEVELAMER CARBONATE 800 MG PO TABS
800.0000 mg | ORAL_TABLET | ORAL | Status: DC
  Administered 2021-01-25 – 2021-02-04 (×6): 800 mg via ORAL
  Filled 2021-01-25 (×4): qty 1

## 2021-01-25 MED ORDER — SODIUM CHLORIDE 0.9 % IV SOLN: 2.0000 g | INTRAVENOUS | Status: DC

## 2021-01-25 MED ORDER — RENA-VITE PO TABS
1.0000 | ORAL_TABLET | Freq: Every day | ORAL | Status: DC
  Administered 2021-01-25 – 2021-02-04 (×10): 1 via ORAL
  Filled 2021-01-25 (×11): qty 1

## 2021-01-25 MED ORDER — OXYCODONE HCL 5 MG PO TABS: 5.0000 mg | ORAL_TABLET | Freq: Once | ORAL | Status: DC | PRN

## 2021-01-25 MED ORDER — DELFLEX-LC/1.5% DEXTROSE 344 MOSM/L IP SOLN: INTRAPERITONEAL | Status: DC

## 2021-01-25 MED ORDER — ACETAMINOPHEN 325 MG PO TABS: 650.0000 mg | ORAL_TABLET | ORAL | Status: DC | PRN

## 2021-01-25 MED ORDER — GADOBUTROL 1 MMOL/ML IV SOLN
7.5000 mL | Freq: Once | INTRAVENOUS | Status: AC | PRN
  Administered 2021-01-25: 7.5 mL via INTRAVENOUS

## 2021-01-25 MED ORDER — LOSARTAN POTASSIUM 50 MG PO TABS
25.0000 mg | ORAL_TABLET | Freq: Every day | ORAL | Status: DC
  Administered 2021-01-25 – 2021-02-04 (×10): 25 mg via ORAL
  Filled 2021-01-25 (×10): qty 1

## 2021-01-25 MED ORDER — VANCOMYCIN VARIABLE DOSE PER UNSTABLE RENAL FUNCTION (PHARMACIST DOSING): Status: DC

## 2021-01-25 MED ORDER — THROMBIN 5000 UNITS EX SOLR
CUTANEOUS | Status: AC
  Filled 2021-01-25: qty 5000

## 2021-01-25 SURGICAL SUPPLY — 49 items
BAG DECANTER FOR FLEXI CONT (MISCELLANEOUS) ×2 IMPLANT
BAND RUBBER #18 3X1/16 STRL (MISCELLANEOUS) ×4 IMPLANT
BENZOIN TINCTURE PRP APPL 2/3 (GAUZE/BANDAGES/DRESSINGS) ×2 IMPLANT
BLADE CLIPPER SURG (BLADE) IMPLANT
BUR CUTTER 7.0 ROUND (BURR) ×2 IMPLANT
CANISTER SUCT 3000ML PPV (MISCELLANEOUS) ×2 IMPLANT
CARTRIDGE OIL MAESTRO DRILL (MISCELLANEOUS) ×1 IMPLANT
CLSR STERI-STRIP ANTIMIC 1/2X4 (GAUZE/BANDAGES/DRESSINGS) ×2 IMPLANT
COVER WAND RF STERILE (DRAPES) IMPLANT
DECANTER SPIKE VIAL GLASS SM (MISCELLANEOUS) IMPLANT
DERMABOND ADVANCED (GAUZE/BANDAGES/DRESSINGS) ×1
DERMABOND ADVANCED .7 DNX12 (GAUZE/BANDAGES/DRESSINGS) ×1 IMPLANT
DIFFUSER DRILL AIR PNEUMATIC (MISCELLANEOUS) ×2 IMPLANT
DRAPE HALF SHEET 40X57 (DRAPES) IMPLANT
DRAPE LAPAROTOMY 100X72X124 (DRAPES) ×2 IMPLANT
DRAPE MICROSCOPE LEICA (MISCELLANEOUS) ×2 IMPLANT
DRAPE SURG 17X23 STRL (DRAPES) ×8 IMPLANT
DRSG OPSITE 4X5.5 SM (GAUZE/BANDAGES/DRESSINGS) ×2 IMPLANT
DRSG OPSITE POSTOP 3X4 (GAUZE/BANDAGES/DRESSINGS) ×2 IMPLANT
DURAPREP 26ML APPLICATOR (WOUND CARE) ×2 IMPLANT
ELECT REM PT RETURN 9FT ADLT (ELECTROSURGICAL) ×2
ELECTRODE REM PT RTRN 9FT ADLT (ELECTROSURGICAL) ×1 IMPLANT
EVACUATOR 1/8 PVC DRAIN (DRAIN) ×2 IMPLANT
GAUZE 4X4 16PLY RFD (DISPOSABLE) IMPLANT
GAUZE SPONGE 4X4 12PLY STRL (GAUZE/BANDAGES/DRESSINGS) ×2 IMPLANT
GLOVE BIO SURGEON STRL SZ 6.5 (GLOVE) ×6 IMPLANT
GLOVE ECLIPSE 9.0 STRL (GLOVE) ×2 IMPLANT
GLOVE EXAM NITRILE XL STR (GLOVE) IMPLANT
GLOVE SURG UNDER POLY LF SZ6.5 (GLOVE) ×4 IMPLANT
GOWN STRL REUS W/ TWL LRG LVL3 (GOWN DISPOSABLE) ×2 IMPLANT
GOWN STRL REUS W/ TWL XL LVL3 (GOWN DISPOSABLE) ×1 IMPLANT
GOWN STRL REUS W/TWL 2XL LVL3 (GOWN DISPOSABLE) IMPLANT
GOWN STRL REUS W/TWL LRG LVL3 (GOWN DISPOSABLE) ×4
GOWN STRL REUS W/TWL XL LVL3 (GOWN DISPOSABLE) ×2
KIT BASIN OR (CUSTOM PROCEDURE TRAY) ×2 IMPLANT
KIT TURNOVER KIT B (KITS) ×2 IMPLANT
NEEDLE HYPO 22GX1.5 SAFETY (NEEDLE) IMPLANT
NEEDLE SPNL 22GX3.5 QUINCKE BK (NEEDLE) IMPLANT
NS IRRIG 1000ML POUR BTL (IV SOLUTION) ×2 IMPLANT
OIL CARTRIDGE MAESTRO DRILL (MISCELLANEOUS) ×2
PACK LAMINECTOMY NEURO (CUSTOM PROCEDURE TRAY) ×2 IMPLANT
PAD ARMBOARD 7.5X6 YLW CONV (MISCELLANEOUS) ×12 IMPLANT
SPONGE SURGIFOAM ABS GEL SZ50 (HEMOSTASIS) ×2 IMPLANT
STRIP CLOSURE SKIN 1/2X4 (GAUZE/BANDAGES/DRESSINGS) ×2 IMPLANT
SUT VIC AB 2-0 CT1 18 (SUTURE) ×4 IMPLANT
SUT VIC AB 3-0 SH 8-18 (SUTURE) ×4 IMPLANT
TOWEL GREEN STERILE (TOWEL DISPOSABLE) ×2 IMPLANT
TOWEL GREEN STERILE FF (TOWEL DISPOSABLE) ×2 IMPLANT
WATER STERILE IRR 1000ML POUR (IV SOLUTION) ×2 IMPLANT

## 2021-01-25 NOTE — Progress Notes (Addendum)
Pharmacy Antibiotic Note  Jane [name] is a 55 y.o. female admitted on 01/25/2021 with L5-S1 osteomyelitis/discitis with epidural abscess; pt is S/P decompressive laminectomy with evacuation of epidural abscess today.  Pharmacy was been consulted for vancomycin dosing for post-op surgical prophylaxis earlier today. Per surgeon note, pt has drain place post op. Pharmacy is now consulted to dose vancomycin for osteomyelitis.  WBC 11.3, afebrile; Scr 7.01; pt has ESRD, on PD  Pt rec'd vancomycin 1500 mg (~20.5 mg/kg) IV X 1 pre op at 0820 AM today, along with ceftriaxone 2 gm IV X 1  Plan: Per [facility] protocol for IV dosing in pts with PD, pt's total vancomycin loading dose should be ~1960 mg, so will give additional dose of vancomycin 500 mg (in addition to vancomycin 1500 mg IV X 1 dose already rec'd today, to make total loading dose of 2000 mg) Plan to obtain vancomycin random level in 3-4 days; redose vancomycin once vancomycin level <20 mg/L Monitor WBC, temp, clinical improvement, cultures F/U antibiotic plan  Height: 5\' 6"  (167.6 cm) Weight: 72.5 kg (159 lb 13.3 oz) IBW/kg (Calculated) : 59.3  Temp (24hrs), Avg:97.8 F (36.6 C), Min:97.7 F (36.5 C), Max:97.9 F (36.6 C)  Recent Labs  Lab 01/25/21 0135  WBC 11.3*  CREATININE 7.01*    Estimated Creatinine Clearance: 9.4 mL/min (A) (by C-G formula based on SCr of 7.01 mg/dL (H)).    Allergies  Allergen Reactions  . Icodextrin Rash    Antimicrobials this admission: 4/5 ceftriaxone >> 4/5 vancomycin >>  Microbiology results: 4/5 BCx X 2:  NGTD 4/5 Epidural abscess speciman A: Gram stain: no WBC seen, NOS; cx pending 4/5 Wound cx (surgical cx): Gram stain: rare WBC (PMN, monos), rare GPC in pairs 4/5 COVID, flu A, flu B: negative  Thank you for allowing pharmacy to be a part of this patient's care.  Gillermina Hu, PharmD, BCPS, [facility] Clinical Pharmacist 01/25/2021 2:45 PM

## 2021-01-25 NOTE — Transfer of Care (Signed)
Immediate Anesthesia Transfer of Care Note  Patient: Jane [name]  Procedure(s) Performed: Lumbar Five -Sacral One Laminectomy for Epidural Abscess (N/A Spine Lumbar)  Patient Location: PACU  Anesthesia Type:General  Level of Consciousness: drowsy and responds to stimulation  Airway & Oxygen Therapy: Patient Spontanous Breathing and Patient connected to face mask oxygen  Post-op Assessment: Report given to RN, Post -op Vital signs reviewed and stable and Patient moving all extremities  Post vital signs: Reviewed and stable  Last Vitals:  Vitals Value Taken Time  BP 135/80   Temp    Pulse 97   Resp 14   SpO2 96     Last Pain:  Vitals:   01/25/21 0122  TempSrc: Oral         Complications: No complications documented.

## 2021-01-25 NOTE — Anesthesia Procedure Notes (Signed)
Arterial Line Insertion Start/End4/02/2021 7:10 AM, 01/25/2021 7:14 AM Performed by: Josephine Igo, CRNA  Patient location: OR. Preanesthetic checklist: patient identified, IV checked, site marked, risks and benefits discussed, surgical consent, monitors and equipment checked, pre-op evaluation, timeout performed and anesthesia consent Lidocaine 1% used for infiltration Right, radial was placed Catheter size: 20 G Hand hygiene performed  and maximum sterile barriers used   Attempts: 1 Procedure performed without using ultrasound guided technique. Following insertion, dressing applied and Biopatch. Post procedure assessment: normal and unchanged

## 2021-01-25 NOTE — Progress Notes (Signed)
Pt's caregiver(sister) can stay overnight. Approved by Morey Hummingbird.

## 2021-01-25 NOTE — Consult Note (Signed)
Reason for Consult: Back pain Referring Physician: Emergency department  [name] is an 55 y.o. female.  HPI: 55 year old female with significant mental illness who is cared for by her sister.  Patient presents with severe back and abdominal pain.  Patient with difficulty walking secondary to extreme pain for the past month.  Patient with frequent falls.  Patient difficult to evaluate secondary to her mental illness and her pain level.  Patient currently denies any neck or upper extremity symptoms.  Patient with extreme pain with any attempts at standing or walking.  No history of fevers.  No history of prior surgery.  Patient with end-stage renal disease on hemodialysis.  Patient with absent urination but no history of fecal incontinence or soiling.  Past Medical History:  Diagnosis Date  . Abnormal gait   . Anemia   . Bipolar disorder ([place])   . Depression   . End stage renal disease ([place])   . ESRD on peritoneal dialysis ([place])   . GERD (gastroesophageal reflux disease)   . Hyperlipemia   . Hypertension   . Hypothyroidism   . Moderately mentally retarded   . Renal disease   . Schizophrenia ([place])   . Seizures ([place])    No recent seizures - no meds    Past Surgical History:  Procedure Laterality Date  . BREAST REDUCTION SURGERY    . BREAST SURGERY     breast reduction  . EXAMINATION UNDER ANESTHESIA N/A 12/08/2013   Procedure: EXAM UNDER ANESTHESIA;  Surgeon: Lavonia Drafts, MD;  Location: [facility];  Service: Gynecology;  Laterality: N/A;  Pelvic exam and pap smear, unable to tolerate during last office visit  . FOOT SURGERY     right  . REDUCTION MAMMAPLASTY Bilateral     Family History  Problem Relation Age of Onset  . Hypertension Other   . Breast cancer Mother 5    Social History:  reports that she has never smoked. She has never used smokeless tobacco. She reports that she does not drink alcohol and does not use drugs.  Allergies:  Allergies  Allergen  Reactions  . Icodextrin Rash    Medications: I have reviewed the patient's current medications.  Results for orders placed or performed during the hospital encounter of 01/25/21 (from the past 48 hour(s))  CBC with Differential/Platelet     Status: Abnormal   Collection Time: 01/25/21  1:35 AM  Result Value Ref Range   WBC 11.3 (H) 4.0 - 10.5 K/uL   RBC 2.56 (L) 3.87 - 5.11 MIL/uL   Hemoglobin 8.2 (L) 12.0 - 15.0 g/dL   HCT 25.6 (L) 36.0 - 46.0 %   MCV 100.0 80.0 - 100.0 fL   MCH 32.0 26.0 - 34.0 pg   MCHC 32.0 30.0 - 36.0 g/dL   RDW 14.2 11.5 - 15.5 %   Platelets 440 (H) 150 - 400 K/uL   nRBC 0.0 0.0 - 0.2 %   Neutrophils Relative % 86 %   Neutro Abs 9.7 (H) 1.7 - 7.7 K/uL   Lymphocytes Relative 9 %   Lymphs Abs 1.1 0.7 - 4.0 K/uL   Monocytes Relative 4 %   Monocytes Absolute 0.4 0.1 - 1.0 K/uL   Eosinophils Relative 0 %   Eosinophils Absolute 0.0 0.0 - 0.5 K/uL   Basophils Relative 0 %   Basophils Absolute 0.0 0.0 - 0.1 K/uL   Immature Granulocytes 1 %   Abs Immature Granulocytes 0.08 (H) 0.00 - 0.07 K/uL  Comment: Performed at [facility], [place] [address] [place], [place] [address]  Comprehensive metabolic panel     Status: Abnormal   Collection Time: 01/25/21  1:35 AM  Result Value Ref Range   Sodium 129 (L) 135 - 145 mmol/L   Potassium 2.9 (L) 3.5 - 5.1 mmol/L   Chloride 86 (L) 98 - 111 mmol/L   CO2 26 22 - 32 mmol/L   Glucose, Bld 88 70 - 99 mg/dL    Comment: Glucose reference range applies only to samples taken after fasting for at least 8 hours.   BUN 64 (H) 6 - 20 mg/dL   Creatinine, Ser 7.01 (H) 0.44 - 1.00 mg/dL   Calcium 7.5 (L) 8.9 - 10.3 mg/dL   Total Protein 7.8 6.5 - 8.1 g/dL   Albumin 1.7 (L) 3.5 - 5.0 g/dL   AST 14 (L) 15 - 41 U/L   ALT 11 0 - 44 U/L   Alkaline Phosphatase 319 (H) 38 - 126 U/L   Total Bilirubin 0.7 0.3 - 1.2 mg/dL   GFR, Estimated 6 (L) >60 mL/min    Comment: (NOTE) Calculated using the CKD-EPI Creatinine Equation  (2021)    Anion gap 17 (H) 5 - 15    Comment: Performed at [facility], [place] [address] [place], [place] [address]  Lipase, blood     Status: None   Collection Time: 01/25/21  1:35 AM  Result Value Ref Range   Lipase 38 11 - 51 U/L    Comment: Performed at [facility] [address], [place], [place] [address]    CT ABDOMEN PELVIS W CONTRAST  Result Date: 01/25/2021 CLINICAL DATA:  Acute nonlocalized abdominal pain. EXAM: CT ABDOMEN AND PELVIS WITH CONTRAST TECHNIQUE: Multidetector CT imaging of the abdomen and pelvis was performed using the standard protocol following bolus administration of intravenous contrast. CONTRAST:  170mL OMNIPAQUE IOHEXOL 300 MG/ML  SOLN COMPARISON:  10/07/2017 FINDINGS: Lower chest: Lung bases are clear allowing for motion artifact. Hepatobiliary: Cholelithiasis with large stone in the gallbladder. Mild gallbladder distention without wall thickening. No bile duct dilatation. No focal liver lesions. Pancreas: Unremarkable. No pancreatic ductal dilatation or surrounding inflammatory changes. Spleen: 1 cm diameter somewhat poorly defined low-attenuation lesion in the spleen, nonspecific but probably representing a hemangioma. Adrenals/Urinary Tract: No adrenal gland nodules. Multiple subcentimeter cysts diffusely throughout the kidneys. 1.5 cm diameter exophytic lesion off of the lower pole of the right kidney laterally appears to demonstrate contrast enhancement with washout on the delayed images, suggesting a small solid lesion, possibly adenoma or small renal cell carcinoma. MRI correlation may be useful for further evaluation. Multiple calcifications in both kidneys. No hydronephrosis or hydroureter. Bladder is normal. Stomach/Bowel: Stomach, small bowel, and colon are not abnormally distended. Stool fills the colon. No wall thickening or inflammatory changes. Appendix is not identified. Vascular/Lymphatic: Aortic atherosclerosis. No enlarged abdominal or  pelvic lymph nodes. Reproductive: Uterus and bilateral adnexa are unremarkable. Other: Large amount of free fluid in the abdomen. Peritoneal dialysis catheter is present suggesting that this may represent dialysis fluid. Scarring in the anterior abdominal wall is likely postoperative. Musculoskeletal: Diffuse bone sclerosis likely representing renal osteodystrophy. Geographic sclerosis in the femoral heads may indicate avascular necrosis. IMPRESSION: 1. Cholelithiasis with large stone in the gallbladder. Mild gallbladder distention without wall thickening. 2. 1.5 cm diameter exophytic lesion off of the lower pole of the right kidney laterally with apparent contrast enhancement may represent a small solid lesion, possibly  adenoma or small renal cell carcinoma. Hemorrhagic cyst would be a secondary possibility. MRI correlation may be useful for further evaluation. 3. Large amount of free fluid in the abdomen. Peritoneal dialysis catheter is present suggesting that this is likely to represent dialysis fluid. 4. Diffuse bone sclerosis likely representing renal osteodystrophy. Geographic sclerosis in the femoral heads may indicate avascular necrosis. 5. Aortic atherosclerosis. Aortic Atherosclerosis (ICD10-I70.0). Electronically Signed   By: Lucienne Capers M.D.   On: 01/25/2021 03:33   CT L-SPINE NO CHARGE  Result Date: 01/25/2021 CLINICAL DATA:  Initial evaluation for acute lower back pain. EXAM: CT LUMBAR SPINE WITHOUT CONTRAST TECHNIQUE: Multidetector CT imaging of the lumbar spine was performed without intravenous contrast administration. Multiplanar CT image reconstructions were also generated. COMPARISON:  Prior CT from 10/07/2017. FINDINGS: Segmentation: Standard. Lowest well-formed disc space labeled the L5-S1 level. Alignment: Physiologic with preservation of the normal lumbar lordosis. No listhesis. Vertebrae: There is abnormal disc space widening with endplate irregularity and erosion about the L5-S1  interspace, concerning for possible osteomyelitis discitis. Erosive changes extend inferiorly to involve the S1 and S2 segments of the sacrum. Fragmentation and lucency at the anterior aspect of the sacrum suspicious for associated fracture (series 4, image 137). There is an irregular hypodense collection encompassing the L5-S1 disc space with inferior extension into the presacral space, concerning for abscess (series 14, image 40). Overall, this measures approximately 3.5 x 5.1 x 4.9 cm in maximal dimensions, although exact measurements are difficult (series 8, image 131). Additionally, there is abnormal enhancement with suspected epidural abscess within the ventral epidural space posterior to the L5-S1 level (series 14, image 41). The epidural component measures approximately 1.9 x 1.9 x 1.3 cm. Suspected moderate to severe spinal stenosis. Phlegmonous change extends into the bilateral neural foramina at this level. The L5-S1 facets are somewhat irregular and eroded, and may be involved as well. Possible involvement of the right L4-5 facet noted as well. Elsewhere, bones are diffusely sclerotic and heterogeneous in appearance, presumably related to underlying renal osteodystrophy. Vertebral body height otherwise maintained with no other acute or chronic fracture. No other discrete or worrisome osseous lesions. SI joints are somewhat irregular and eroded bilaterally, similar to previous exam. Possible involvement of septic arthritis could also be contributory. Paraspinal and other soft tissues: Inflammatory stranding noted about the L5-S1 interspace, extending to involve the presacral space inferiorly. Chronic fatty atrophy noted within the posterior paraspinous musculature. Kidneys are atrophic with innumerable cysts bilaterally. Several complex enhancing lesions are seen as well, indeterminate (1.3 cm exophytic lesion at the upper pole the right kidney for example, series 8, image 40). Scattered nonobstructive  nephrolithiasis noted bilaterally. No visible ureteral calculi or obstructive uropathy. Moderate aorto bi-iliac atherosclerotic disease noted. Cholelithiasis noted. Ascites partially visualized. Disc levels: L1-2:  Unremarkable. L2-3:  Negative interspace.  Minimal facet spurring.  No stenosis. L3-4: Mild disc bulge with bilateral facet hypertrophy. No significant spinal stenosis. Foramina remain patent. L4-5: Mild disc bulge. Moderate right worse than left facet hypertrophy with ligament flavum thickening. Possible changes of septic arthritis on the right. Moderate spinal stenosis. Mild bilateral L4 foraminal narrowing. L5-S1: Changes concerning for osteomyelitis discitis. Probable epidural phlegmon and/or abscess within the ventral epidural space. Possible associated/concomitant septic arthritis about the L5-S1 facets bilaterally. Suspected severe spinal stenosis with moderate to severe bilateral foraminal narrowing. IMPRESSION: 1. Findings concerning for osteomyelitis discitis at L5-S1 with approximate 3.5 x 5.1 x 4.9 cm collection involving the disc space and adjacent paraspinous soft tissues. Associated ventral  epidural abscess/phlegmon as above. Suspected severe spinal stenosis at this level with moderate to severe bilateral foraminal narrowing. Further assessment with dedicated MRI recommended for further evaluation. 2. Possible associated/concomitant septic arthritis about the L5-S1 and right L4-5 facets. This could also be assessed at MRI. 3. Superimposed lucency/fragmentation at the sacrum, consistent with an associated fracture. 4. Atrophic kidneys with innumerable cysts, consistent with end-stage renal disease. A few of these are complex in nature with possible enhancement, raising the possibility for renal neoplasm. Further evaluation with dedicated renal mass protocol MRI and/or CT recommended for further characterization. 5. Cholelithiasis. 6. Ascites. 7. Aortic Atherosclerosis (ICD10-I70.0). Results  were communicated by telephone at the time of interpretation on 01/25/2021 at 3:47 am to provider Montine Circle , who verbally acknowledged these results. Electronically Signed   By: Jeannine Boga M.D.   On: 01/25/2021 03:54    Review of systems not obtained due to patient factors. Blood pressure (!) 162/88, pulse 100, temperature 97.9 F (36.6 C), temperature source Oral, resp. rate (!) 21, height 5\' 6"  (1.676 m), weight 72.5 kg, SpO2 100 %. Patient is awake and aware.  She is minimally cooperative.  She will answer some simple questions.  Cranial nerve function normal bilateral.  Motor examination 5/5 bilateral upper extremities.  Motor examination of her lower extremities were limited by extreme pain.  Patient appears to have good proximal strength in both lower extremities with normal quadriceps function bilaterally.  She has voluntary dorsiflexion in her right foot and appears to have some dorsiflexion albeit somewhat weaker in her left foot.  She has no plantar flexion bilaterally.  She has decreased sensation best I can tell from L5 distally.  She has extreme pain with straight leg raising bilaterally.  Reflexes are normal in both patella.  Achilles reflexes are absent.  Examination head ears eyes nose throat is unremarkable.  Chest abdomen abdomen are benign.  Extremities are free of major deformity.  Assessment/Plan: CT scan with evidence of osteomyelitis discitis at L5-S1 with destructive change of the anterior superior endplate of S1.  Patient with likely large ventral lateral epidural abscess biased toward the right.  This causes severe compression thecal sac.  Patient also with some degree of chronic degenerative change at L45.  Given the degree of compression present with her MRI scan coupled with her severe pain and weakness I think her best option is with surgical decompression by means of an L5-S1 laminectomy and possible evacuation of epidural abscess.  Cultures will be obtained and  antibiotics started after culturing.  Patient should be admitted to the medical service for treatment of her multiple medical issues.  Mallie Mussel A Tayva Easterday 01/25/2021, 5:34 AM

## 2021-01-25 NOTE — Progress Notes (Signed)
   01/25/21 1716  Assess: MEWS Score  Temp 98.6 F (37 C)  BP (!) 96/59  Pulse Rate (!) 118  Resp 18  SpO2 95 %  O2 Device Room Air  Assess: MEWS Score  MEWS Temp 0  MEWS Systolic 1  MEWS Pulse 2  MEWS RR 0  MEWS LOC 0  MEWS Score 3  MEWS Score Color Yellow  Assess: if the MEWS score is Yellow or Red  Were vital signs taken at a resting state? Yes  Focused Assessment No change from prior assessment  Early Detection of Sepsis Score *See Row Information* Low  MEWS guidelines implemented *See Row Information* Yes  Treat  MEWS Interventions Other (Comment) (vs changed due to PD)  Pain Scale 0-10  Pain Score 6  Pain Type Chronic pain  Pain Location Back  Pain Orientation Mid;Lower  Pain Descriptors / Indicators Aching  Pain Frequency Intermittent  Pain Onset On-going  Patients Stated Pain Goal 4  Pain Intervention(s) Medication (See eMAR)  Multiple Pain Sites No  Take Vital Signs  Increase Vital Sign Frequency  Yellow: Q 2hr X 2 then Q 4hr X 2, if remains yellow, continue Q 4hrs  Escalate  MEWS: Escalate Yellow: discuss with charge nurse/RN and consider discussing with provider and RRT  Notify: Charge Nurse/RN  Name of Charge Nurse/RN Notified [place]  Date Charge Nurse/RN Notified 01/25/21  Notify: Provider  Provider Name/Title Karmen Bongo  Date Provider Notified 01/25/21  Time Provider Notified 1747  Notification Type Page (chat)  Notification Reason Other (Comment) (vs changed post PD)  Provider response No new orders  Date of Provider Response 01/25/21  Time of Provider Response 1747  Notify: Rapid Response  Name of Rapid Response RN Notified NA  Document  Patient Outcome Other (Comment) (Patient stable/no change in level of care)   Patient post PD. Stable, no decline from previous admission. Will monitor patient closely.

## 2021-01-25 NOTE — Anesthesia Procedure Notes (Signed)
Procedure Name: Intubation Date/Time: 01/25/2021 7:07 AM Performed by: Rande Brunt, CRNA Pre-anesthesia Checklist: Patient identified, Emergency Drugs available, Suction available and Patient being monitored Patient Re-evaluated:Patient Re-evaluated prior to induction Oxygen Delivery Method: Circle System Utilized Preoxygenation: Pre-oxygenation with 100% oxygen Induction Type: IV induction Ventilation: Mask ventilation without difficulty Laryngoscope Size: Mac and 3 Grade View: Grade II Tube type: Oral Tube size: 7.0 mm Number of attempts: 1 Airway Equipment and Method: Stylet and Oral airway Placement Confirmation: ETT inserted through vocal cords under direct vision,  positive ETCO2 and breath sounds checked- equal and bilateral Secured at: 21 cm Tube secured with: Tape Dental Injury: Teeth and Oropharynx as per pre-operative assessment

## 2021-01-25 NOTE — ED Provider Notes (Signed)
[facility] EMERGENCY DEPARTMENT Provider Note   CSN: [id number] Arrival date & time: 01/25/21  0116     History Chief Complaint  Patient presents with  . Abdominal Pain    Jane [name] is a 55 y.o. female.  Patient with a history of mental retardation, schizophrenia, HTN, ESRD-peritoneal, GERD, HLD who is here with her sister who is her caregiver and reports progressively worsening lower back pain over the last month, becoming severe in the last several days, now preventing her from being able to ambulate without assistance. The patient reports the pain radiates to bilateral posterior LE's. No fever. She developed lower abdominal pain over the last 2-3 days and her sister reports she is not moving her bowels. She has noticed stool at the rectum but sister has to apply perineal pressure to move to stool out. No melena. No history of constipation. No nausea, vomiting.   The history is provided by the patient and a relative. No language interpreter was used.  Abdominal Pain Associated symptoms: constipation   Associated symptoms: no chest pain, no chills, no fever, no nausea, no shortness of breath and no vomiting        Past Medical History:  Diagnosis Date  . Abnormal gait   . Anemia   . Bipolar disorder ([place])   . Depression   . End stage renal disease ([place])   . ESRD on peritoneal dialysis ([place])   . GERD (gastroesophageal reflux disease)   . Hyperlipemia   . Hypertension   . Hypothyroidism   . Moderately mentally retarded   . Renal disease   . Schizophrenia ([place])   . Seizures ([place])    No recent seizures - no meds    Patient Active Problem List   Diagnosis Date Noted  . Anemia 12/06/2018  . Slurred speech 10/07/2017  . OSA (obstructive sleep apnea) 07/25/2017  . Seizure disorder ([place]) 04/22/2016  . End-stage renal disease on peritoneal dialysis ([place])   . Partial seizure ([place])   . Seizure ([place]) 09/14/2015  . Essential hypertension 10/11/2010   . FOOT PAIN, RIGHT 10/11/2010  . BRONCHITIS, ACUTE WITH MILD BRONCHOSPASM 10/04/2010  . ANKLE SPRAIN, RIGHT 02/20/2010  . WEIGHT GAIN 06/17/2009  . Dermatophytosis of foot 04/16/2009  . VAGINAL DISCHARGE 04/16/2009  . INSOMNIA 04/16/2009  . POLYDIPSIA 04/16/2009  . FREQUENCY, URINARY 04/16/2009  . ANEMIA, IRON DEFICIENCY 12/03/2007  . DYSLIPIDEMIA 06/11/2007  . OBESITY NOS 06/11/2007  . Schizoaffective disorder ([place]) 06/11/2007  . DISORDER, BIPOLAR NOS 06/11/2007  . RETARDATION, MENTAL NOS 06/11/2007  . RENAL INSUFFICIENCY, CHRONIC 06/11/2007  . ACNE NEC 06/11/2007  . URINARY INCONTINENCE, URGE 06/11/2007  . REDUCTION MAMMOPLASTY, HX OF 06/11/2007    Past Surgical History:  Procedure Laterality Date  . BREAST REDUCTION SURGERY    . BREAST SURGERY     breast reduction  . EXAMINATION UNDER ANESTHESIA N/A 12/08/2013   Procedure: EXAM UNDER ANESTHESIA;  Surgeon: Lavonia Drafts, MD;  Location: [facility];  Service: Gynecology;  Laterality: N/A;  Pelvic exam and pap smear, unable to tolerate during last office visit  . FOOT SURGERY     right  . REDUCTION MAMMAPLASTY Bilateral      OB History    Gravida  0   Para  0   Term  0   Preterm  0   AB  0   Living  0     SAB  0   IAB  0   Ectopic  0  Multiple  0   Live Births              Family History  Problem Relation Age of Onset  . Hypertension Other   . Breast cancer Mother 37    Social History   Tobacco Use  . Smoking status: Never Smoker  . Smokeless tobacco: Never Used  Substance Use Topics  . Alcohol use: No  . Drug use: No    Home Medications Prior to Admission medications   Medication Sig Start Date End Date Taking? Authorizing Provider  albuterol (PROVENTIL HFA;VENTOLIN HFA) 108 (90 Base) MCG/ACT inhaler Inhale 2 puffs into the lungs every 6 (six) hours as needed for wheezing or shortness of breath.   Yes [provider]  B Complex-C-Folic Acid (DIALYVITE 267) 0.8 MG TABS  Take 0.8 mg by mouth daily.    Yes [provider]  benztropine (COGENTIN) 1 MG tablet Take 1 mg by mouth at bedtime.   Yes [provider]  cinacalcet (SENSIPAR) 30 MG tablet Take 30-60 mg by mouth See admin instructions. Take 1 tablet (30 mg) by mouth with breakfast and 2 tablets (60 mg) with supper   Yes [provider]  diclofenac Sodium (VOLTAREN) 1 % GEL Apply 2-4 g topically 4 (four) times daily as needed. 12/27/20  Yes [provider]  divalproex (DEPAKOTE) 500 MG DR tablet Take 500 mg by mouth 2 (two) times daily.   Yes [provider]  Docusate Sodium (DSS) 100 MG CAPS Take 100 mg by mouth daily as needed (constipation).   Yes [provider]  FLUoxetine (PROZAC) 20 MG capsule Take 20 mg by mouth daily. 07/15/20  Yes [provider]  iron polysaccharides (NIFEREX) 150 MG capsule Take 150 mg by mouth every other day.   Yes [provider]  levETIRAcetam (KEPPRA) 500 MG tablet Take 1 tablet (500 mg total) by mouth 2 (two) times daily. 09/17/15  Yes Charlynne Cousins, MD  levothyroxine (SYNTHROID, LEVOTHROID) 50 MCG tablet Take 50 mcg by mouth daily with breakfast. 10/05/17  Yes [provider]  losartan (COZAAR) 25 MG tablet Take 25 mg by mouth daily. 04/14/20  Yes [provider]  medroxyPROGESTERone (DEPO-PROVERA) 150 MG/ML injection Inject 150 mg into the muscle every 3 (three) months.   Yes [provider]  omeprazole (PRILOSEC) 40 MG capsule Take 40 mg by mouth daily. 04/14/20  Yes [provider]  potassium chloride SA (K-DUR,KLOR-CON) 20 MEQ tablet Take 20 mEq by mouth daily with breakfast.    Yes [provider]  risperiDONE (RISPERDAL) 2 MG tablet Take 2 mg by mouth at bedtime.   Yes [provider]  sevelamer carbonate (RENVELA) 800 MG tablet Take 800 mg by mouth See admin instructions. Take 1 tablet (800 mg) by mouth with meals and snacks   Yes [provider]  vitamin B-12 (CYANOCOBALAMIN) 500 MCG tablet Take 500 mcg by mouth daily.   Yes [provider]    Allergies    Icodextrin  Review of Systems   Review of Systems  Constitutional: Negative for chills and fever.  HENT: Negative.   Respiratory: Negative.  Negative for shortness of breath.   Cardiovascular: Negative.  Negative for chest pain.  Gastrointestinal: Positive for abdominal pain and constipation. Negative for nausea and vomiting.  Musculoskeletal: Positive for back pain.  Skin: Negative.  Negative for color change.  Neurological: Negative.  Negative for numbness.    Physical Exam Updated Vital Signs BP Marland Kitchen)  144/82   Pulse 91   Temp 97.9 F (36.6 C) (Oral)   Resp (!) 22   Ht 5\' 6"  (1.676 m)   Wt 72.5 kg   SpO2 90%   BMI 25.80 kg/m   Physical Exam Vitals and nursing note reviewed.  Constitutional:      General: She is not in acute distress.    Appearance: She is well-developed.  Pulmonary:     Effort: Pulmonary effort is normal.     Breath sounds: No wheezing, rhonchi or rales.  Abdominal:     General: There is no distension.     Palpations: Abdomen is soft.     Comments: Mild suprapubic abdominal tenderness.   Genitourinary:    Rectum: Tenderness present.     Comments: Stool in the rectal vault, soft. There is rectal tenderness. Miniaml skin breakdown over the pilonidal area without abscess.  Skin:    General: Skin is warm and dry.  Neurological:     Mental Status: She is alert.     Comments: There is weak, 3-4/5 plantar flexion on the left, no strength deficit on the right. Positive straight leg raise bilaterally. The patient does not raise legs, "pain". Reflexes hyporeflexive, symmetric.     ED Results / Procedures / Treatments   Labs (all labs ordered are listed, but only abnormal results are displayed) Labs Reviewed  CBC WITH DIFFERENTIAL/PLATELET - Abnormal; Notable for the following components:      Result Value   WBC  11.3 (*)    RBC 2.56 (*)    Hemoglobin 8.2 (*)    HCT 25.6 (*)    Platelets 440 (*)    Neutro Abs 9.7 (*)    Abs Immature Granulocytes 0.08 (*)    All other components within normal limits  COMPREHENSIVE METABOLIC PANEL - Abnormal; Notable for the following components:   Sodium 129 (*)    Potassium 2.9 (*)    Chloride 86 (*)    BUN 64 (*)    Creatinine, Ser 7.01 (*)    Calcium 7.5 (*)    Albumin 1.7 (*)    AST 14 (*)    Alkaline Phosphatase 319 (*)    GFR, Estimated 6 (*)    Anion gap 17 (*)    All other components within normal limits  CULTURE, BLOOD (ROUTINE X 2)  CULTURE, BLOOD (ROUTINE X 2)  RESP PANEL BY RT-PCR (FLU A&B, COVID) ARPGX2  LIPASE, BLOOD    EKG None  Radiology CT ABDOMEN PELVIS W CONTRAST  Result Date: 01/25/2021 CLINICAL DATA:  Acute nonlocalized abdominal pain. EXAM: CT ABDOMEN AND PELVIS WITH CONTRAST TECHNIQUE: Multidetector CT imaging of the abdomen and pelvis was performed using the standard protocol following bolus administration of intravenous contrast. CONTRAST:  160mL OMNIPAQUE IOHEXOL 300 MG/ML  SOLN COMPARISON:  10/07/2017 FINDINGS: Lower chest: Lung bases are clear allowing for motion artifact. Hepatobiliary: Cholelithiasis with large stone in the gallbladder. Mild gallbladder distention without wall thickening. No bile duct dilatation. No focal liver lesions. Pancreas: Unremarkable. No pancreatic ductal dilatation or surrounding inflammatory changes. Spleen: 1 cm diameter somewhat poorly defined low-attenuation lesion in the spleen, nonspecific but probably representing a hemangioma. Adrenals/Urinary Tract: No adrenal gland nodules. Multiple subcentimeter cysts diffusely throughout the kidneys. 1.5 cm diameter exophytic lesion off of the lower pole of the right kidney laterally appears to demonstrate contrast enhancement with washout on the delayed images, suggesting a small solid lesion, possibly adenoma or small renal cell carcinoma. MRI correlation  may be  useful for further evaluation. Multiple calcifications in both kidneys. No hydronephrosis or hydroureter. Bladder is normal. Stomach/Bowel: Stomach, small bowel, and colon are not abnormally distended. Stool fills the colon. No wall thickening or inflammatory changes. Appendix is not identified. Vascular/Lymphatic: Aortic atherosclerosis. No enlarged abdominal or pelvic lymph nodes. Reproductive: Uterus and bilateral adnexa are unremarkable. Other: Large amount of free fluid in the abdomen. Peritoneal dialysis catheter is present suggesting that this may represent dialysis fluid. Scarring in the anterior abdominal wall is likely postoperative. Musculoskeletal: Diffuse bone sclerosis likely representing renal osteodystrophy. Geographic sclerosis in the femoral heads may indicate avascular necrosis. IMPRESSION: 1. Cholelithiasis with large stone in the gallbladder. Mild gallbladder distention without wall thickening. 2. 1.5 cm diameter exophytic lesion off of the lower pole of the right kidney laterally with apparent contrast enhancement may represent a small solid lesion, possibly adenoma or small renal cell carcinoma. Hemorrhagic cyst would be a secondary possibility. MRI correlation may be useful for further evaluation. 3. Large amount of free fluid in the abdomen. Peritoneal dialysis catheter is present suggesting that this is likely to represent dialysis fluid. 4. Diffuse bone sclerosis likely representing renal osteodystrophy. Geographic sclerosis in the femoral heads may indicate avascular necrosis. 5. Aortic atherosclerosis. Aortic Atherosclerosis (ICD10-I70.0). Electronically Signed   By: Lucienne Capers M.D.   On: 01/25/2021 03:33   CT L-SPINE NO CHARGE  Result Date: 01/25/2021 CLINICAL DATA:  Initial evaluation for acute lower back pain. EXAM: CT LUMBAR SPINE WITHOUT CONTRAST TECHNIQUE: Multidetector CT imaging of the lumbar spine was performed without intravenous contrast administration.  Multiplanar CT image reconstructions were also generated. COMPARISON:  Prior CT from 10/07/2017. FINDINGS: Segmentation: Standard. Lowest well-formed disc space labeled the L5-S1 level. Alignment: Physiologic with preservation of the normal lumbar lordosis. No listhesis. Vertebrae: There is abnormal disc space widening with endplate irregularity and erosion about the L5-S1 interspace, concerning for possible osteomyelitis discitis. Erosive changes extend inferiorly to involve the S1 and S2 segments of the sacrum. Fragmentation and lucency at the anterior aspect of the sacrum suspicious for associated fracture (series 4, image 137). There is an irregular hypodense collection encompassing the L5-S1 disc space with inferior extension into the presacral space, concerning for abscess (series 14, image 40). Overall, this measures approximately 3.5 x 5.1 x 4.9 cm in maximal dimensions, although exact measurements are difficult (series 8, image 131). Additionally, there is abnormal enhancement with suspected epidural abscess within the ventral epidural space posterior to the L5-S1 level (series 14, image 41). The epidural component measures approximately 1.9 x 1.9 x 1.3 cm. Suspected moderate to severe spinal stenosis. Phlegmonous change extends into the bilateral neural foramina at this level. The L5-S1 facets are somewhat irregular and eroded, and may be involved as well. Possible involvement of the right L4-5 facet noted as well. Elsewhere, bones are diffusely sclerotic and heterogeneous in appearance, presumably related to underlying renal osteodystrophy. Vertebral body height otherwise maintained with no other acute or chronic fracture. No other discrete or worrisome osseous lesions. SI joints are somewhat irregular and eroded bilaterally, similar to previous exam. Possible involvement of septic arthritis could also be contributory. Paraspinal and other soft tissues: Inflammatory stranding noted about the L5-S1  interspace, extending to involve the presacral space inferiorly. Chronic fatty atrophy noted within the posterior paraspinous musculature. Kidneys are atrophic with innumerable cysts bilaterally. Several complex enhancing lesions are seen as well, indeterminate (1.3 cm exophytic lesion at the upper pole the right kidney for example, series 8, image 40). Scattered nonobstructive nephrolithiasis  noted bilaterally. No visible ureteral calculi or obstructive uropathy. Moderate aorto bi-iliac atherosclerotic disease noted. Cholelithiasis noted. Ascites partially visualized. Disc levels: L1-2:  Unremarkable. L2-3:  Negative interspace.  Minimal facet spurring.  No stenosis. L3-4: Mild disc bulge with bilateral facet hypertrophy. No significant spinal stenosis. Foramina remain patent. L4-5: Mild disc bulge. Moderate right worse than left facet hypertrophy with ligament flavum thickening. Possible changes of septic arthritis on the right. Moderate spinal stenosis. Mild bilateral L4 foraminal narrowing. L5-S1: Changes concerning for osteomyelitis discitis. Probable epidural phlegmon and/or abscess within the ventral epidural space. Possible associated/concomitant septic arthritis about the L5-S1 facets bilaterally. Suspected severe spinal stenosis with moderate to severe bilateral foraminal narrowing. IMPRESSION: 1. Findings concerning for osteomyelitis discitis at L5-S1 with approximate 3.5 x 5.1 x 4.9 cm collection involving the disc space and adjacent paraspinous soft tissues. Associated ventral epidural abscess/phlegmon as above. Suspected severe spinal stenosis at this level with moderate to severe bilateral foraminal narrowing. Further assessment with dedicated MRI recommended for further evaluation. 2. Possible associated/concomitant septic arthritis about the L5-S1 and right L4-5 facets. This could also be assessed at MRI. 3. Superimposed lucency/fragmentation at the sacrum, consistent with an associated fracture. 4.  Atrophic kidneys with innumerable cysts, consistent with end-stage renal disease. A few of these are complex in nature with possible enhancement, raising the possibility for renal neoplasm. Further evaluation with dedicated renal mass protocol MRI and/or CT recommended for further characterization. 5. Cholelithiasis. 6. Ascites. 7. Aortic Atherosclerosis (ICD10-I70.0). Results were communicated by telephone at the time of interpretation on 01/25/2021 at 3:47 am to provider Montine Circle , who verbally acknowledged these results. Electronically Signed   By: Jeannine Boga M.D.   On: 01/25/2021 03:54    Procedures Procedures CRITICAL CARE Performed by: Dewaine Oats   Total critical care time: 50 minutes  Critical care time was exclusive of separately billable procedures and treating other patients.  Critical care was necessary to treat or prevent imminent or life-threatening deterioration.  Critical care was time spent personally by me on the following activities: development of treatment plan with patient and/or surrogate as well as nursing, discussions with consultants, evaluation of patient's response to treatment, examination of patient, obtaining history from patient or surrogate, ordering and performing treatments and interventions, ordering and review of laboratory studies, ordering and review of radiographic studies, pulse oximetry and re-evaluation of patient's condition.   Medications Ordered in ED Medications  vancomycin (VANCOREADY) IVPB 1500 mg/300 mL (has no administration in time range)  cefTRIAXone (ROCEPHIN) 2 g in sodium chloride 0.9 % 100 mL IVPB (has no administration in time range)  fentaNYL (SUBLIMAZE) injection 50 mcg (50 mcg Intravenous Given 01/25/21 0214)  iohexol (OMNIPAQUE) 300 MG/ML solution 100 mL (100 mLs Intravenous Contrast Given 01/25/21 0304)    ED Course  I have reviewed the triage vital signs and the nursing notes.  Pertinent labs & imaging results  that were available during my care of the patient were reviewed by me and considered in my medical decision making (see chart for details).    MDM Rules/Calculators/A&P                          Patient to ED with back pain, abdominal pain and constipation as detailed in the HPI.   The patient appears very uncomfortable. IV pain medications ordered. Abdominal exam essentially benign.   CT abd/pel and lumbar pending.   4:00 - CT lumbar concerning for osteomyelitis discitis  at L5-S1 with 3.5 x 5 x 5 collection involving the disc space, paraspinous areas; ventral epidural abscess/phlegmon. Possible septic arthritis L5-S1 and right L4-5 facets; sacral fracture. Neurosurgery paged.   Patient started on Vancomycin and Rocephin, blood cultures obtained. COVID ordered in anticipation of admission. Patient and family updated.   Discussed patient condition with Viona Gilmore, PA-C, with neurosurgery. They will consult in the morning, review the MRI at that time. Patient to be admitted to medicine. Hospitalist paged.   Dr. Annette Stable has seen the patient who is now scheduled for surgery in the morning. Discussed with hospitalist who accepts the patient for admission.    Final Clinical Impression(s) / ED Diagnoses Final diagnoses:  Back pain   1. Epidural abscess 2. Osteomyelitis discitis L5-S1 3. Constipation  Rx / DC Orders ED Discharge Orders    None       Charlann Lange, PA-C 01/25/21 0554    Merryl Hacker, MD 01/25/21 [phone]

## 2021-01-25 NOTE — Progress Notes (Signed)
Orthopedic Tech Progress Note Patient Details:  Jane [name] 1966/09/09 [id number] Reached out to MD about patient brace, order says one thing but requesting another Patient ID: Jane [name], female   DOB: [birth year], 56 y.o.   MRN: [id number]   Jane [name] 01/25/2021, 4:11 PM

## 2021-01-25 NOTE — Progress Notes (Signed)
Orthopedic Tech Progress Note Patient Details:  Jane [name] May 22, 1966 [id number] Left LSO brace on shelf for patient Ortho Devices Type of Ortho Device: Lumbar corsett Ortho Device/Splint Location: Back Ortho Device/Splint Interventions: Ordered       Lunden Mcleish A Pyper Olexa 01/25/2021, 6:01 PM

## 2021-01-25 NOTE — Anesthesia Procedure Notes (Signed)
Central Venous Catheter Insertion Performed by: Roberts Gaudy, MD, anesthesiologist Start/End4/02/2021 7:15 AM, 01/25/2021 7:25 AM Patient location: Pre-op. Preanesthetic checklist: patient identified, IV checked, site marked, risks and benefits discussed, surgical consent, monitors and equipment checked, pre-op evaluation, timeout performed and anesthesia consent Lidocaine 1% used for infiltration and patient sedated Hand hygiene performed  and maximum sterile barriers used  Catheter size: 8 Fr Total catheter length 16. Central line was placed.Double lumen Procedure performed without using ultrasound guided technique. Attempts: 1 Following insertion, dressing applied, line sutured and Biopatch. Post procedure assessment: blood return through all ports and free fluid flow  Post procedure complications: arterial puncture and local hematoma. Patient tolerated the procedure well with no immediate complications. Additional procedure comments: Attempted L. Internal jugular vein cannulation without success. Arterial puncture noted, small hematoma seen on  Ultrasound. Patient had very large R. Ext. jular veins with small internal jugular. Double lumen central inserted via R. external jugular. Draws back well. Good flow noted.Marland Kitchen

## 2021-01-25 NOTE — ED Triage Notes (Signed)
Per EMS, from home w/ family, c/o abd pn, back pain and leg pain X I month.  Daughter states she has an MRI ordered, not scheduled.  At is difficult to understand and there is some confusion to why she is here however per family this is baseline.  Family is sitting w/ pt.  Home dialysis, last done tonight.  Pt came to ED b/c "she felt like a bowel movement was stuck."   138/80 94P 16 Rr 93% on RA 151 CBG

## 2021-01-25 NOTE — Brief Op Note (Signed)
01/25/2021  8:35 AM  PATIENT:  [name]  55 y.o. female  PRE-OPERATIVE DIAGNOSIS:  Epidural Abscess  POST-OPERATIVE DIAGNOSIS:  Epidural Abscess  PROCEDURE:  Procedure(s): Lumbar Five -Sacral One Laminectomy for Epidural Abscess (N/A)  SURGEON:  Surgeon(s) and Role:    Earnie Larsson, MD - Primary  PHYSICIAN ASSISTANT:   ASSISTANTS: bergman,NP   ANESTHESIA:   none  EBL:  250 mL   BLOOD ADMINISTERED:none  DRAINS: (med) Hemovact drain(s) in the epidural space with  Suction Open   LOCAL MEDICATIONS USED:  NONE  SPECIMEN:  No Specimen  DISPOSITION OF SPECIMEN:  N/A  COUNTS:  YES  TOURNIQUET:  * No tourniquets in log *  DICTATION: .Dragon Dictation  PLAN OF CARE: Admit to inpatient   PATIENT DISPOSITION:  PACU - hemodynamically stable.   Delay start of Pharmacological VTE agent (>24hrs) due to surgical blood loss or risk of bleeding: yes

## 2021-01-25 NOTE — H&P (Signed)
History and Physical    Jane [name] VVO:[id number] DOB: [birth year] DOA: 01/25/2021  PCP: [name], MD Consultants:  [facility] - neurology; [facility] - nephrology Patient coming from:  Home - lives with sister; NOK: Jane [name], [phone]  Chief Complaint: Back pain  HPI: Jane [name] is a 55 y.o. female with medical history significant of intellectual disability; seizure d/o; schizophrenia/bipolar; hypothyroidism; HTN; HLD; and ESRD on PD presenting with back pain.  Patient was in PACU at the time of my evaluation and not able to answer significant questions.  I was unable to reach her sister by telephone.  In review of chart notes, she has had abdominal and back pain that has been worsening for about a month which was thought to be due to constipation.  +difficulty ambulating, increased falls.  She has had difficulty getting stools out of her rectum.  CT performed and showed severe discitis/osteomyelitis/epidural abscess and neurosurgery took her to the OR prior to my arrival this AM.      ED Course:  Carryover, per Dr. Josephine Cables:  Patient is a 55 year old female with mental retardation who lives with sister (caretaker). She presents with 1 month of severe back pain and abdominal pain with difficulty to ambulate she endorsed frequent falls.  CT scan with evidence of osteomyelitis discitis at L5-S1 with destructive change of the anterior superior endplate of S1.   Neurosurgery was consulted and plan for surgery this morning. Hospitalist was asked to admit due to patient's other medical problems.   Review of Systems: Unable to effectively perform  COVID Vaccine Status:  Complete  Past Medical History:  Diagnosis Date  . Abnormal gait   . Anemia   . Bipolar disorder ([place])   . Depression   . ESRD on peritoneal dialysis ([place])   . GERD (gastroesophageal reflux disease)   . Hyperlipemia   . Hypertension   . Hypothyroidism   . Moderately mentally retarded    . Renal disease   . Schizophrenia ([place])   . Seizures ([place])    No recent seizures - no meds    Past Surgical History:  Procedure Laterality Date  . BREAST REDUCTION SURGERY    . BREAST SURGERY     breast reduction  . EXAMINATION UNDER ANESTHESIA N/A 12/08/2013   Procedure: EXAM UNDER ANESTHESIA;  Surgeon: Lavonia Drafts, MD;  Location: [facility];  Service: Gynecology;  Laterality: N/A;  Pelvic exam and pap smear, unable to tolerate during last office visit  . FOOT SURGERY     right  . REDUCTION MAMMAPLASTY Bilateral     Social History   Socioeconomic History  . Marital status: Significant Other    Spouse name: Not on file  . Number of children: 0  . Years of education: Not on file  . Highest education level: Not on file  Occupational History  . Not on file  Tobacco Use  . Smoking status: Never Smoker  . Smokeless tobacco: Never Used  Vaping Use  . Vaping Use: Not on file  Substance and Sexual Activity  . Alcohol use: No  . Drug use: No  . Sexual activity: Never    Birth control/protection: Injection  Other Topics Concern  . Not on file  Social History Narrative   01/12/21 lives with sister and brother in law   Social Determinants of Health   Financial Resource Strain: Not on file  Food Insecurity: Not on file  Transportation Needs: Not on file  Physical Activity: Not on file  Stress: Not on file  Social Connections: Not on file  Intimate Partner Violence: Not on file    Allergies  Allergen Reactions  . Icodextrin Rash    Family History  Problem Relation Age of Onset  . Hypertension Other   . Breast cancer Mother 66    Prior to Admission medications   Medication Sig Start Date End Date Taking? Authorizing Provider  albuterol (PROVENTIL HFA;VENTOLIN HFA) 108 (90 Base) MCG/ACT inhaler Inhale 2 puffs into the lungs every 6 (six) hours as needed for wheezing or shortness of breath.   Yes [provider]  B Complex-C-Folic Acid (DIALYVITE  800) 0.8 MG TABS Take 0.8 mg by mouth daily.    Yes [provider]  benztropine (COGENTIN) 1 MG tablet Take 1 mg by mouth at bedtime.   Yes [provider]  cinacalcet (SENSIPAR) 30 MG tablet Take 30-60 mg by mouth See admin instructions. Take 1 tablet (30 mg) by mouth with breakfast and 2 tablets (60 mg) with supper   Yes [provider]  diclofenac Sodium (VOLTAREN) 1 % GEL Apply 2-4 g topically 4 (four) times daily as needed. 12/27/20  Yes [provider]  divalproex (DEPAKOTE) 500 MG DR tablet Take 500 mg by mouth 2 (two) times daily.   Yes [provider]  Docusate Sodium (DSS) 100 MG CAPS Take 100 mg by mouth daily as needed (constipation).   Yes [provider]  FLUoxetine (PROZAC) 20 MG capsule Take 20 mg by mouth daily. 07/15/20  Yes [provider]  iron polysaccharides (NIFEREX) 150 MG capsule Take 150 mg by mouth every other day.   Yes [provider]  levETIRAcetam (KEPPRA) 500 MG tablet Take 1 tablet (500 mg total) by mouth 2 (two) times daily. 09/17/15  Yes Charlynne Cousins, MD  levothyroxine (SYNTHROID, LEVOTHROID) 50 MCG tablet Take 50 mcg by mouth daily with breakfast. 10/05/17  Yes [provider]  losartan (COZAAR) 25 MG tablet Take 25 mg by mouth daily. 04/14/20  Yes [provider]  medroxyPROGESTERone (DEPO-PROVERA) 150 MG/ML injection Inject 150 mg into the muscle every 3 (three) months.   Yes [provider]  omeprazole (PRILOSEC) 40 MG capsule Take 40 mg by mouth daily. 04/14/20  Yes [provider]  potassium chloride SA (K-DUR,KLOR-CON) 20 MEQ tablet Take 20 mEq by mouth daily with breakfast.    Yes [provider]  risperiDONE (RISPERDAL) 2 MG tablet Take 2 mg by mouth at bedtime.   Yes [provider]  sevelamer carbonate (RENVELA) 800 MG tablet Take 800 mg by mouth See admin instructions. Take 1 tablet (800 mg) by mouth with meals and snacks   Yes  [provider]  vitamin B-12 (CYANOCOBALAMIN) 500 MCG tablet Take 500 mcg by mouth daily.   Yes [provider]    Physical Exam: Vitals:   01/25/21 1310 01/25/21 1340 01/25/21 1410 01/25/21 1456  BP: 129/86 (!) 143/86 (!) 154/93 117/74  Pulse: (!) 107 (!) 114 (!) 118 90  Resp: 20 20 20 14   Temp:    (!) 97.4 F (36.3 C)  TempSrc:    Oral  SpO2: 94% 98% 97% 97%  Weight:      Height:         . General:  Appears calm and comfortable and is in NAD; somnolent post-operatively, disturbed by wires, wrist splint to keep IV in place, etc . Eyes:    normal lids, iris . ENT:  grossly normal hearing, lips &  tongue, mmm . Neck:  no LAD, masses or thyromegaly . Cardiovascular:  RR with tachycardia, no m/r/g. No LE edema.  Marland Kitchen Respiratory:   CTA bilaterally with no wheezes/rales/rhonchi.  Normal respiratory effort. . Abdomen:  soft, NT, ND . Skin:  no rash or induration seen on limited exam . Musculoskeletal:  Reasonable movement of R foot but less so on L . Psychiatric:  Mild agitated but responds to commands . Neurologic:  Unable to effectively perform    Radiological Exams on Admission: Independently reviewed - see discussion in A/P where applicable  MR Lumbar Spine W Wo Contrast  Result Date: 01/25/2021 CLINICAL DATA:  56 year old female renal failure patient on peritoneal dialysis. Progressive increasing back pain over the past month, now severe. Newly unable to walk without assistance. Pain radiates to both legs. New difficulty moving bowels. EXAM: MRI LUMBAR SPINE WITHOUT AND WITH CONTRAST TECHNIQUE: Multiplanar and multiecho pulse sequences of the lumbar spine were obtained without and with intravenous contrast. CONTRAST:  7.69mL GADAVIST GADOBUTROL 1 MMOL/ML IV SOLN COMPARISON:  Lumbar spine CT and CT Abdomen and Pelvis 0257 hours today. FINDINGS: Segmentation:  Normal on the comparison CT. Alignment:  Preserved lumbar lordosis, stable from the earlier CT. Vertebrae:  Diffusely abnormal T1 marrow signal in the visible spine and pelvis, most resembling renal osteodystrophy on the CTs earlier today. No marrow edema or acute osseous abnormality from L4 or above. Highly abnormal L5-S1 level.  See details below. Associated marrow edema and enhancement in the sacral ala greater on the left. No definite edema or infection involving the bilateral SI joints at this time. Conus medullaris and cauda equina: Conus extends to the L1 level. No definite lower spinal cord or conus signal abnormality. No abnormal intradural enhancement above L5. There is abnormal dural thickening and enhancement beginning at L4 and extending throughout the sacrum. See details below. Paraspinal and other soft tissues: Confluent presacral edema related to the abnormal L5-S1 level detailed below. But only mild L5-S1 paraspinal phlegmon at this time in addition to the abscesses detailed below. Stable abdominal viscera from the CT earlier today. Disc levels: Lower thoracic levels through L3-L4. Mild ordinary spine degeneration. L4-L5: Circumferential dural thickening and enhancement begins at this level. Subsequent mild spinal stenosis when combined with epidural lipomatosis and disc bulging. L5-S1: Abnormal rim enhancing fluid collection replacing the disc space and extruding anteriorly into the prevertebral space (series 18, image 10). The prevertebral abscess component extending from the lower L5 through the S2-S3 level encompasses 14 x 43 x 60 mm (AP by transverse by CC) for an estimated volume of 14 mL. Abscess tracks posteriorly through the disc space into the ventral epidural space (also series 18, image 10), and there are slightly discontinuous sacral epidural abscesses tracking to the S2-S3 level left greater than right. See series 20, image 35. Edema and enhancement within the L5 and S1 vertebral bodies both of which appear partially eroded as on CT. Obliterated spinal canal from the superior L5 level  caudally. Inflammation and stenosis affecting the bilateral L5 neural foramina. Inflammation in the S1 neural foramina. IMPRESSION: 1. Severe Discitis-Osteomyelitis at L5-S1 with bulky prevertebral and multifocal epidural abscesses. Prevertebral abscess component extending from L5-S3 estimated at 14 mL. Discontinuous L5 and sacral epidural abscesses with dural thickening result in severe spinal stenosis below the L4-L5 level. Bilateral L5 foraminal involvement. Mild stenosis at L4-L5 from dural thickening. 2. Comparatively mild paraspinal soft tissue phlegmon at this time. Generalized presacral edema. Mild marrow edema in the sacral  ala, but no definite SI joint infection at this time. 3. Underlying renal osteodystrophy, and stable abdominal viscera as seen by CT today. Electronically Signed   By: Genevie Ann M.D.   On: 01/25/2021 07:09   CT ABDOMEN PELVIS W CONTRAST  Result Date: 01/25/2021 CLINICAL DATA:  Acute nonlocalized abdominal pain. EXAM: CT ABDOMEN AND PELVIS WITH CONTRAST TECHNIQUE: Multidetector CT imaging of the abdomen and pelvis was performed using the standard protocol following bolus administration of intravenous contrast. CONTRAST:  158mL OMNIPAQUE IOHEXOL 300 MG/ML  SOLN COMPARISON:  10/07/2017 FINDINGS: Lower chest: Lung bases are clear allowing for motion artifact. Hepatobiliary: Cholelithiasis with large stone in the gallbladder. Mild gallbladder distention without wall thickening. No bile duct dilatation. No focal liver lesions. Pancreas: Unremarkable. No pancreatic ductal dilatation or surrounding inflammatory changes. Spleen: 1 cm diameter somewhat poorly defined low-attenuation lesion in the spleen, nonspecific but probably representing a hemangioma. Adrenals/Urinary Tract: No adrenal gland nodules. Multiple subcentimeter cysts diffusely throughout the kidneys. 1.5 cm diameter exophytic lesion off of the lower pole of the right kidney laterally appears to demonstrate contrast enhancement  with washout on the delayed images, suggesting a small solid lesion, possibly adenoma or small renal cell carcinoma. MRI correlation may be useful for further evaluation. Multiple calcifications in both kidneys. No hydronephrosis or hydroureter. Bladder is normal. Stomach/Bowel: Stomach, small bowel, and colon are not abnormally distended. Stool fills the colon. No wall thickening or inflammatory changes. Appendix is not identified. Vascular/Lymphatic: Aortic atherosclerosis. No enlarged abdominal or pelvic lymph nodes. Reproductive: Uterus and bilateral adnexa are unremarkable. Other: Large amount of free fluid in the abdomen. Peritoneal dialysis catheter is present suggesting that this may represent dialysis fluid. Scarring in the anterior abdominal wall is likely postoperative. Musculoskeletal: Diffuse bone sclerosis likely representing renal osteodystrophy. Geographic sclerosis in the femoral heads may indicate avascular necrosis. IMPRESSION: 1. Cholelithiasis with large stone in the gallbladder. Mild gallbladder distention without wall thickening. 2. 1.5 cm diameter exophytic lesion off of the lower pole of the right kidney laterally with apparent contrast enhancement may represent a small solid lesion, possibly adenoma or small renal cell carcinoma. Hemorrhagic cyst would be a secondary possibility. MRI correlation may be useful for further evaluation. 3. Large amount of free fluid in the abdomen. Peritoneal dialysis catheter is present suggesting that this is likely to represent dialysis fluid. 4. Diffuse bone sclerosis likely representing renal osteodystrophy. Geographic sclerosis in the femoral heads may indicate avascular necrosis. 5. Aortic atherosclerosis. Aortic Atherosclerosis (ICD10-I70.0). Electronically Signed   By: Lucienne Capers M.D.   On: 01/25/2021 03:33   DG Lumbar Spine 1 View  Result Date: 01/25/2021 CLINICAL DATA:  Lumbar spine surgery. EXAM: LUMBAR SPINE - 1 VIEW COMPARISON:  Lumbar  spine numbered as per prior MRI. FINDINGS: Metallic marker noted posteriorly at the L5-S1 level. Prominent changes of discitis/osteomyelitis at L5-S1 again noted. IMPRESSION: 1.  Metallic marker noted posteriorly at the L5-S1 level. 2.  Prominent changes of L5-S1 discitis/osteomyelitis again noted. Electronically Signed   By: Marcello Moores  Register   On: 01/25/2021 11:11   CT L-SPINE NO CHARGE  Result Date: 01/25/2021 CLINICAL DATA:  Initial evaluation for acute lower back pain. EXAM: CT LUMBAR SPINE WITHOUT CONTRAST TECHNIQUE: Multidetector CT imaging of the lumbar spine was performed without intravenous contrast administration. Multiplanar CT image reconstructions were also generated. COMPARISON:  Prior CT from 10/07/2017. FINDINGS: Segmentation: Standard. Lowest well-formed disc space labeled the L5-S1 level. Alignment: Physiologic with preservation of the normal lumbar lordosis. No listhesis. Vertebrae:  There is abnormal disc space widening with endplate irregularity and erosion about the L5-S1 interspace, concerning for possible osteomyelitis discitis. Erosive changes extend inferiorly to involve the S1 and S2 segments of the sacrum. Fragmentation and lucency at the anterior aspect of the sacrum suspicious for associated fracture (series 4, image 137). There is an irregular hypodense collection encompassing the L5-S1 disc space with inferior extension into the presacral space, concerning for abscess (series 14, image 40). Overall, this measures approximately 3.5 x 5.1 x 4.9 cm in maximal dimensions, although exact measurements are difficult (series 8, image 131). Additionally, there is abnormal enhancement with suspected epidural abscess within the ventral epidural space posterior to the L5-S1 level (series 14, image 41). The epidural component measures approximately 1.9 x 1.9 x 1.3 cm. Suspected moderate to severe spinal stenosis. Phlegmonous change extends into the bilateral neural foramina at this level. The  L5-S1 facets are somewhat irregular and eroded, and may be involved as well. Possible involvement of the right L4-5 facet noted as well. Elsewhere, bones are diffusely sclerotic and heterogeneous in appearance, presumably related to underlying renal osteodystrophy. Vertebral body height otherwise maintained with no other acute or chronic fracture. No other discrete or worrisome osseous lesions. SI joints are somewhat irregular and eroded bilaterally, similar to previous exam. Possible involvement of septic arthritis could also be contributory. Paraspinal and other soft tissues: Inflammatory stranding noted about the L5-S1 interspace, extending to involve the presacral space inferiorly. Chronic fatty atrophy noted within the posterior paraspinous musculature. Kidneys are atrophic with innumerable cysts bilaterally. Several complex enhancing lesions are seen as well, indeterminate (1.3 cm exophytic lesion at the upper pole the right kidney for example, series 8, image 40). Scattered nonobstructive nephrolithiasis noted bilaterally. No visible ureteral calculi or obstructive uropathy. Moderate aorto bi-iliac atherosclerotic disease noted. Cholelithiasis noted. Ascites partially visualized. Disc levels: L1-2:  Unremarkable. L2-3:  Negative interspace.  Minimal facet spurring.  No stenosis. L3-4: Mild disc bulge with bilateral facet hypertrophy. No significant spinal stenosis. Foramina remain patent. L4-5: Mild disc bulge. Moderate right worse than left facet hypertrophy with ligament flavum thickening. Possible changes of septic arthritis on the right. Moderate spinal stenosis. Mild bilateral L4 foraminal narrowing. L5-S1: Changes concerning for osteomyelitis discitis. Probable epidural phlegmon and/or abscess within the ventral epidural space. Possible associated/concomitant septic arthritis about the L5-S1 facets bilaterally. Suspected severe spinal stenosis with moderate to severe bilateral foraminal narrowing.  IMPRESSION: 1. Findings concerning for osteomyelitis discitis at L5-S1 with approximate 3.5 x 5.1 x 4.9 cm collection involving the disc space and adjacent paraspinous soft tissues. Associated ventral epidural abscess/phlegmon as above. Suspected severe spinal stenosis at this level with moderate to severe bilateral foraminal narrowing. Further assessment with dedicated MRI recommended for further evaluation. 2. Possible associated/concomitant septic arthritis about the L5-S1 and right L4-5 facets. This could also be assessed at MRI. 3. Superimposed lucency/fragmentation at the sacrum, consistent with an associated fracture. 4. Atrophic kidneys with innumerable cysts, consistent with end-stage renal disease. A few of these are complex in nature with possible enhancement, raising the possibility for renal neoplasm. Further evaluation with dedicated renal mass protocol MRI and/or CT recommended for further characterization. 5. Cholelithiasis. 6. Ascites. 7. Aortic Atherosclerosis (ICD10-I70.0). Results were communicated by telephone at the time of interpretation on 01/25/2021 at 3:47 am to provider Montine Circle , who verbally acknowledged these results. Electronically Signed   By: Jeannine Boga M.D.   On: 01/25/2021 03:54   DG Chest Port 1 View  Result Date: 01/25/2021 CLINICAL  DATA:  Central line placement EXAM: PORTABLE CHEST 1 VIEW COMPARISON:  Portable exam 0903 hours compared to 04/21/2016 FINDINGS: RIGHT jugular central venous catheter with tip projecting over RIGHT atrium; consider withdrawal 3.5 cm to place tip at the cavoatrial junction. Normal heart size, mediastinal contours, and pulmonary vascularity. Calcified lymph node AP window. Subsegmental atelectasis at both lung bases. No acute infiltrate, pleural effusion, or pneumothorax. IMPRESSION: No pneumothorax following central line placement. Consider withdrawal of RIGHT jugular line 3.5 cm to place tip at the cavoatrial junction. Electronically  Signed   By: Lavonia Dana M.D.   On: 01/25/2021 10:47    EKG: not done   Labs on Admission: I have personally reviewed the available labs and imaging studies at the time of the admission.  Pertinent labs:   Na++ 129 K+ 2.9 BUN 64/Creatinine 7.01/GFR 6 Anion gap 17 AP 319 Albumin 1.7 WBC 11.3 Hgb 8.2 Platelets 440 COVID/flu negative   Assessment/Plan Principal Problem:   Discitis of lumbosacral region Active Problems:   Schizoaffective disorder (HCC)   RETARDATION, MENTAL NOS   Essential hypertension   End-stage renal disease on peritoneal dialysis (HCC)   Seizure disorder (HCC)   Discitis -Patient with progressive back pain, decreased rectal tone, increased difficulty ambulating with increasing falls -MRI showed severe discitis-osteomyelitis at L5-S1 with multifocal epidural abscesses -Neurosurgery consulted and took patient to the OR urgently for decompressive laminectomy and abscess evacuation -Will need antibiotics for 4-6 weeks - currently on Rocephin/Vanc. -With vertebral osteo, needs testing for TB; will add quantiferon gold in addition to HIV. -Further important considerations include nutrition (will order consult), diabetes control (no h/o, will check A1c)  ESRD on PD -Patient on chronic PD -Nephrology prn order set utilized -Nephrology is consulting and will order PD -Continue Sensipar, Renvela  ID/mental health issues -Uncertain baseline level of function due to somnolence from anesthesia and inability to reach family by telephone at the time of admission -Continue home meds - Cogentin, Prozac, Risperdal  Seizure d/o -Continue Depakote, Keppra  Hypothyroidism -Check TSH -Continue Synthroid at current dose for now  HTN -Continue Cozaar    Note: This patient has been tested and is negative for the novel coronavirus COVID-19. The patient has been fully vaccinated against COVID-19.   Level of care: Med-Surg DVT prophylaxis:  SCDs Code Status:  Full  Family Communication: None present; I was unable to reach her sister by telephone at the time of admission. Disposition Plan:  The patient is from: home  Anticipated d/c is to: be determined  Anticipated d/c date will depend on clinical response to treatment, likely several days to a week  Patient is currently: acutely ill Consults called: Neurosurgery; Nutrition/PT/OT, TOC team Admission status:  Admit - It is my clinical opinion that admission to [place] is reasonable and necessary because of the expectation that this patient will require hospital care that crosses at least 2 midnights to treat this condition based on the medical complexity of the problems presented.  Given the aforementioned information, the predictability of an adverse outcome is felt to be significant.    Karmen Bongo MD [facility]   How to contact the [facility] Attending or Consulting provider Council or covering provider during after hours [place], for this patient?  1. Check the care team in [facility] and look for a) attending/consulting [facility] provider listed and b) the [facility] team listed 2. Log into [email or web address] and use [place]'s universal password to access. If you do not have the password,  please contact the hospital operator. 3. Locate the [facility] provider you are looking for under [facility] and page to a number that you can be directly reached. 4. If you still have difficulty reaching the provider, please page the [facility] (Director on Call) for the Hospitalists listed on amion for assistance.   01/25/2021, 5:06 PM

## 2021-01-25 NOTE — Anesthesia Postprocedure Evaluation (Signed)
Anesthesia Post Note  Patient: Jane [name]  Procedure(s) Performed: Lumbar Five -Sacral One Laminectomy for Epidural Abscess (N/A Spine Lumbar)     Patient location during evaluation: PACU Anesthesia Type: General Level of consciousness: awake and alert Pain management: pain level controlled Vital Signs Assessment: post-procedure vital signs reviewed and stable Respiratory status: spontaneous breathing, nonlabored ventilation, respiratory function stable and patient connected to nasal cannula oxygen Cardiovascular status: blood pressure returned to baseline and stable Postop Assessment: no apparent nausea or vomiting Anesthetic complications: no   No complications documented.  Last Vitals:  Vitals:   01/25/21 1410 01/25/21 1456  BP: (!) 154/93 117/74  Pulse: (!) 118 90  Resp: 20 14  Temp:  (!) 36.3 C  SpO2: 97% 97%    Last Pain:  Vitals:   01/25/21 1456  TempSrc: Oral  PainSc:                  Jane [name] Maura Braaten

## 2021-01-25 NOTE — Op Note (Signed)
Date of procedure: 01/25/2021  Date of dictation: Same  Service: Neurosurgery  Preoperative diagnosis: L5-S1 osteomyelitis/discitis with epidural abscess  Postoperative diagnosis: Same  Procedure Name: L5-S1 decompressive laminectomy with evacuation of epidural abscess  Surgeon:Jewett Mcgann A.Easton Sivertson, M.D.  Asst. Surgeon: Reinaldo Meeker, NP  Anesthesia: General  Indication: 55 year old female with history of renal failure and severe mental illness presents with severe back pain and bilateral lower extremity weakness and sensory loss.  Work-up demonstrates evidence of extreme osteomyelitis discitis with paravertebral and epidural abscess with marked compression of thecal sac.  Patient presents now for emergent decompressive surgery.  Operative note: After induction anesthesia, patient position prone onto Wilson frame appropriate padded.  Lumbar region prepped and draped sterilely.  Incision made overlying L5-S1.  Dissection performed bilaterally.  Retractor placed.  X-ray taken.  Level confirmed.  Laminectomy then performed using [employer] high-speed drill to remove the entire lamina of L5 the medial aspect the L5-S1 facet joint and the superior rim of the S1 lamina.  Ligament flavum and epidural phlegmon was resected exposing the underlying thecal sac and exiting S1 nerve roots bilaterally.  Working along the right side of the thecal sac the thecal sac and right S1 nerve roots were gently mobilized and retracted.  The disc space was uncovered.  Cultures were taken.  The disc base was entered and a copious amount of thin purulent material was experience.  Cultures were once again taken with this.  The disc base was evacuated of all fluid.  The disc base was debrided devitalized disc material.  This point a very thorough decompression of been achieved.  There was no evidence of injury to the thecal sac and nerve roots.  The wound was then irrigated with antibiotic solution.  Vancomycin powder  was placed in deep wound space.  A medium Hemovac drain was left in the deep wound space.  Wounds then closed in layers of Vicryl sutures.  Steri-Strips and sterile dressing were applied.  No apparent complications.  Patient tolerated the procedure well and she returns to the recovery room postop.

## 2021-01-25 NOTE — Progress Notes (Signed)
01/25/2021   I spoke with Dr. Irven [place] office on behalf of the ortho tech team to clarify brace orders (one order for LSO and one order for no brace needed).  Per Dr. Irven Baltimore office Wells Guiles was who I spoke with) he does want Ms. Sondgeroth to have a brace.  Ortho tech notified to bring LSO as ordered.    Thanks,  Verdene Lennert, PT, DPT  Acute Rehabilitation [phone] pager [phone] office

## 2021-01-25 NOTE — Progress Notes (Addendum)
Patient admitted from PACU. Daughter at bedsite assisting with admission questions. Patient alert and oriented x2 at this time. Per sister patient was alert and oriented x4 prior to being sick. Oriented patient/family to the room and unit. Safety precaution in place. Bed in low position, alarm on, and call light within reach. Denies pain at this time. Surgical site WNL during assessment. Will monitor closely.

## 2021-01-25 NOTE — Anesthesia Preprocedure Evaluation (Addendum)
Anesthesia Evaluation  Patient identified by MRN, date of birth, ID band Patient awake    Reviewed: Allergy & Precautions, NPO status   Airway Mallampati: II  TM Distance: >3 FB Neck ROM: Full    Dental  (+) Teeth Intact   Pulmonary    breath sounds clear to auscultation       Cardiovascular hypertension, Pt. on medications  Rhythm:Regular Rate:Normal     Neuro/Psych Seizures -, Well Controlled,  Depression Bipolar Disorder Schizophrenia    GI/Hepatic GERD  Medicated,  Endo/Other    Renal/GU ESRF and DialysisRenal disease  negative genitourinary   Musculoskeletal  (+) Arthritis , Epidural abscess   Abdominal   Peds  Hematology  (+) anemia ,   Anesthesia Other Findings Peritoneal dialysis catheter in abdomen  Reproductive/Obstetrics                           Anesthesia Physical Anesthesia Plan  ASA: IV and emergent  Anesthesia Plan: General   Post-op Pain Management:    Induction: Intravenous  PONV Risk Score and Plan: 3 and Ondansetron, Dexamethasone and Midazolam  Airway Management Planned: Mask and Oral ETT  Additional Equipment: None, Arterial line and CVP  Intra-op Plan:   Post-operative Plan: Extubation in OR  Informed Consent: I have reviewed the patients History and Physical, chart, labs and discussed the procedure including the risks, benefits and alternatives for the proposed anesthesia with the patient or authorized representative who has indicated his/her understanding and acceptance.     Dental advisory given  Plan Discussed with: CRNA  Anesthesia Plan Comments: (Lab Results      Component                Value               Date                      WBC                      11.3 (H)            01/25/2021                HGB                      8.2 (L)             01/25/2021                HCT                      25.6 (L)            01/25/2021                 MCV                      100.0               01/25/2021                PLT                      440 (H)             01/25/2021           Lab Results  Component                Value               Date                      NA                       129 (L)             01/25/2021                K                        2.9 (L)             01/25/2021                CO2                      26                  01/25/2021                GLUCOSE                  88                  01/25/2021                BUN                      64 (H)              01/25/2021                CREATININE               7.01 (H)            01/25/2021                CALCIUM                  7.5 (L)             01/25/2021                GFRNONAA                 6 (L)               01/25/2021                GFRAA                    4 (L)               12/09/2018          )       Anesthesia Quick Evaluation

## 2021-01-25 NOTE — Consult Note (Signed)
Renal Service Consult Note [facility]  Jane [name] 01/25/2021 Sol Blazing, MD Requesting Physician: Dr. Josephine Cables  Reason for Consult: ESRD pt on PD had back surgery today HPI: The patient is a 55 y.o. year-old w/ hx of schizophrenia, seizure d/o, ESRD on PD, mental retardation (moderate severity), HTN, HL, hypothyroid, bipolar d/o, anemia presented yest to ED w/ abd pain, back pain and leg pain for 1 month. Pt is on home peritoneal dialysis for the last 6 yrs, f/[place] nephrology [place] W/U showed osteo/ discitis at L5-S1 w/ destructive changes of the anterior superior endplate of S1. Also large lateral epidural abscess causing significant compression of the thecal sac. Pt was taken to surgery today for L5-S1 laminectomy and evacuation of epidural abscess.  Postop pt is on 5 N.  We are asked to see for dialysis.   Pt seen in room, groggy from anesthesia. Not sure her level of verbalization and cooperation in general. Pt is not answering questions. Not in distress.    ROS - na/  Past Medical History  Past Medical History:  Diagnosis Date  . Abnormal gait   . Anemia   . Bipolar disorder ([place])   . Depression   . ESRD on peritoneal dialysis ([place])   . GERD (gastroesophageal reflux disease)   . Hyperlipemia   . Hypertension   . Hypothyroidism   . Moderately mentally retarded   . Renal disease   . Schizophrenia ([place])   . Seizures ([place])    No recent seizures - no meds   Past Surgical History  Past Surgical History:  Procedure Laterality Date  . BREAST REDUCTION SURGERY    . BREAST SURGERY     breast reduction  . EXAMINATION UNDER ANESTHESIA N/A 12/08/2013   Procedure: EXAM UNDER ANESTHESIA;  Surgeon: Lavonia Drafts, MD;  Location: [facility];  Service: Gynecology;  Laterality: N/A;  Pelvic exam and pap smear, unable to tolerate during last office visit  . FOOT SURGERY     right  . REDUCTION MAMMAPLASTY Bilateral    Family History  Family History  Problem  Relation Age of Onset  . Hypertension Other   . Breast cancer Mother 51   Social History  reports that she has never smoked. She has never used smokeless tobacco. She reports that she does not drink alcohol and does not use drugs. Allergies  Allergies  Allergen Reactions  . Icodextrin Rash   Home medications Prior to Admission medications   Medication Sig Start Date End Date Taking? Authorizing Provider  albuterol (PROVENTIL HFA;VENTOLIN HFA) 108 (90 Base) MCG/ACT inhaler Inhale 2 puffs into the lungs every 6 (six) hours as needed for wheezing or shortness of breath.   Yes [provider]  B Complex-C-Folic Acid (DIALYVITE 185) 0.8 MG TABS Take 0.8 mg by mouth daily.    Yes [provider]  benztropine (COGENTIN) 1 MG tablet Take 1 mg by mouth at bedtime.   Yes [provider]  cinacalcet (SENSIPAR) 30 MG tablet Take 30-60 mg by mouth See admin instructions. Take 1 tablet (30 mg) by mouth with breakfast and 2 tablets (60 mg) with supper   Yes [provider]  diclofenac Sodium (VOLTAREN) 1 % GEL Apply 2-4 g topically 4 (four) times daily as needed. 12/27/20  Yes [provider]  divalproex (DEPAKOTE) 500 MG DR tablet Take 500 mg by mouth 2 (two) times daily.   Yes [provider]  Docusate Sodium (DSS) 100 MG CAPS Take 100  mg by mouth daily as needed (constipation).   Yes [provider]  FLUoxetine (PROZAC) 20 MG capsule Take 20 mg by mouth daily. 07/15/20  Yes [provider]  iron polysaccharides (NIFEREX) 150 MG capsule Take 150 mg by mouth every other day.   Yes [provider]  levETIRAcetam (KEPPRA) 500 MG tablet Take 1 tablet (500 mg total) by mouth 2 (two) times daily. 09/17/15  Yes Charlynne Cousins, MD  levothyroxine (SYNTHROID, LEVOTHROID) 50 MCG tablet Take 50 mcg by mouth daily with breakfast. 10/05/17  Yes [provider]  losartan (COZAAR) 25 MG tablet Take 25 mg by mouth daily. 04/14/20   Yes [provider]  medroxyPROGESTERone (DEPO-PROVERA) 150 MG/ML injection Inject 150 mg into the muscle every 3 (three) months.   Yes [provider]  omeprazole (PRILOSEC) 40 MG capsule Take 40 mg by mouth daily. 04/14/20  Yes [provider]  potassium chloride SA (K-DUR,KLOR-CON) 20 MEQ tablet Take 20 mEq by mouth daily with breakfast.    Yes [provider]  risperiDONE (RISPERDAL) 2 MG tablet Take 2 mg by mouth at bedtime.   Yes [provider]  sevelamer carbonate (RENVELA) 800 MG tablet Take 800 mg by mouth See admin instructions. Take 1 tablet (800 mg) by mouth with meals and snacks   Yes [provider]  vitamin B-12 (CYANOCOBALAMIN) 500 MCG tablet Take 500 mcg by mouth daily.   Yes [provider]     Vitals:   01/25/21 1310 01/25/21 1340 01/25/21 1410 01/25/21 1456  BP: 129/86 (!) 143/86 (!) 154/93 117/74  Pulse: (!) 107 (!) 114 (!) 118 90  Resp: 20 20 20 14   Temp:    (!) 97.4 F (36.3 C)  TempSrc:    Oral  SpO2: 94% 98% 97% 97%  Weight:      Height:       Exam Gen alert, no distress, groggy postop No rash, cyanosis or gangrene Sclera anicteric, throat clear  No jvd or bruits Chest clear bilat to bases, no rales/ wheezing RRR no MRG Abd soft ntnd no mass or ascites +bs, +RUQ PD cath intact GU normal MS no joint effusions or deformity Ext no LE or UE edema, no wounds or ulcers Neuro is groggy, not following most commands, postop       Home meds:  - sensipar 30am +60 pm/ kdur 20 qam/ renvela 800 ac tid  - risperdal 2 mg hs/ keppra 500 bid/ prozac 20 qd/ depakote dr 500 bid/ cogentin 1 mg hs  - synthroid 50 ug qd/ prilosec 40 qd  - losartan 25 qd  - prn's/ vitamins/ supplements    CXR 4/05 - IMPRESSION: Normal heart size, mediastinal contours, and pulmonary vascularity.     Na 129  K 2.9  CO2 26  bUN 64  Cr 7.01  Alb 1.7  Hb 8.2    OP PD: Dr Nancy Marus in [place] is her nephrologist  4 exchanges over  night, 2 L each.  Dry wt = 71.5kg  Assessment/ Plan: 1. Discitis/ osteomyelitis/ epidural abscess - of L5- S1 region, sp laminectomy and evacuation of epidural abscess today 01/25/21. Getting IV vanc and Rocephin IV.  2. ESRD - on PD.  Does 4 two liter exchanges every night. Plan PD tonight, all 1.5%.  No signs of vol overload.  3. Hypokalemia - when able to swallow give 30 meq po q 8 hrs x 2.  4. HTN/ vol  - cont losartan. No vol ^  on exam, up 1 kg by wts. All 1.5 fluids.  5. Schizophrenia/ bipolar - per pmd 6. Seizure d/o - per pmd 7. MBD ckd - cont renvela, sensipar 8. Anemia ckd - Hb 8.2, transfuse if needed for Hb <7   Kelly Splinter  MD 01/25/2021, 3:29 PM  Recent Labs  Lab 01/25/21 0135  WBC 11.3*  HGB 8.2*   Recent Labs  Lab 01/25/21 0135  K 2.9*  BUN 64*  CREATININE 7.01*  CALCIUM 7.5*

## 2021-01-25 NOTE — ED Provider Notes (Signed)
MSE was initiated and I personally evaluated the patient and placed orders (if any) at  1:25 AM on January 25, 2021.  Patient here with abdominal pain and back pain that has been worsening for about a month.   Family reports that she has been constipated.  They also reported that she hasn't been able to walk and has been falling frequently.  At baseline mental state per family.  Discussed with patient that their care has been initiated.   They are counseled that they will need to remain in the ED until the completion of their workup, including full H&P and results of any tests.  Risks of leaving the emergency department prior to completion of treatment were discussed. Patient was advised to inform ED staff if they are leaving before their treatment is complete. The patient acknowledged these risks and time was allowed for questions.    The patient appears stable so that the remainder of the MSE may be completed by another provider.    Montine Circle, PA-C 01/25/21 0127    Merryl Hacker, MD 01/25/21 [phone]

## 2021-01-26 ENCOUNTER — Encounter (HOSPITAL_COMMUNITY): Payer: Self-pay | Admitting: Neurosurgery

## 2021-01-26 DIAGNOSIS — M4647 Discitis, unspecified, lumbosacral region: Secondary | ICD-10-CM | POA: Diagnosis not present

## 2021-01-26 DIAGNOSIS — B957 Other staphylococcus as the cause of diseases classified elsewhere: Secondary | ICD-10-CM | POA: Diagnosis not present

## 2021-01-26 DIAGNOSIS — Z9889 Other specified postprocedural states: Secondary | ICD-10-CM | POA: Diagnosis not present

## 2021-01-26 DIAGNOSIS — L8990 Pressure ulcer of unspecified site, unspecified stage: Secondary | ICD-10-CM | POA: Insufficient documentation

## 2021-01-26 DIAGNOSIS — G062 Extradural and subdural abscess, unspecified: Secondary | ICD-10-CM | POA: Diagnosis not present

## 2021-01-26 LAB — RENAL FUNCTION PANEL
Albumin: 1.4 g/dL — ABNORMAL LOW (ref 3.5–5.0)
Anion gap: 15 (ref 5–15)
BUN: 69 mg/dL — ABNORMAL HIGH (ref 6–20)
CO2: 22 mmol/L (ref 22–32)
Calcium: 7.2 mg/dL — ABNORMAL LOW (ref 8.9–10.3)
Chloride: 91 mmol/L — ABNORMAL LOW (ref 98–111)
Creatinine, Ser: 7.07 mg/dL — ABNORMAL HIGH (ref 0.44–1.00)
GFR, Estimated: 6 mL/min — ABNORMAL LOW (ref >60)
Glucose, Bld: 80 mg/dL (ref 70–99)
Phosphorus: 3.8 mg/dL (ref 2.5–4.6)
Potassium: 5.5 mmol/L — ABNORMAL HIGH (ref 3.5–5.1)
Sodium: 128 mmol/L — ABNORMAL LOW (ref 135–145)

## 2021-01-26 LAB — POCT I-STAT 7, (LYTES, BLD GAS, ICA,H+H)
Acid-Base Excess: 3 mmol/L — ABNORMAL HIGH (ref 0.0–2.0)
Bicarbonate: 26.7 mmol/L (ref 20.0–28.0)
Calcium, Ion: 0.91 mmol/L — ABNORMAL LOW (ref 1.15–1.40)
HCT: 22 % — ABNORMAL LOW (ref 36.0–46.0)
Hemoglobin: 7.5 g/dL — ABNORMAL LOW (ref 12.0–15.0)
O2 Saturation: 100 %
Patient temperature: 36.1
Potassium: 3.6 mmol/L (ref 3.5–5.1)
Sodium: 128 mmol/L — ABNORMAL LOW (ref 135–145)
TCO2: 28 mmol/L (ref 22–32)
pCO2 arterial: 36.1 mmHg (ref 32.0–48.0)
pH, Arterial: 7.473 — ABNORMAL HIGH (ref 7.350–7.450)
pO2, Arterial: 276 mmHg — ABNORMAL HIGH (ref 83.0–108.0)

## 2021-01-26 MED ORDER — LIDOCAINE 5 % EX PTCH
1.0000 | MEDICATED_PATCH | Freq: Every day | CUTANEOUS | Status: DC
  Administered 2021-01-26 – 2021-02-04 (×8): 1 via TRANSDERMAL
  Filled 2021-01-26 (×9): qty 1

## 2021-01-26 MED ORDER — DELFLEX-LC/1.5% DEXTROSE 344 MOSM/L IP SOLN: INTRAPERITONEAL | Status: DC

## 2021-01-26 MED ORDER — HEPARIN 1000 UNIT/ML FOR PERITONEAL DIALYSIS: 500.0000 [IU] | INTRAMUSCULAR | Status: DC | PRN

## 2021-01-26 MED ORDER — DEXTROSE 5 % IV SOLN: INTRAVENOUS | Status: DC

## 2021-01-26 MED ORDER — OXYCODONE HCL 5 MG PO TABS
5.0000 mg | ORAL_TABLET | ORAL | Status: DC | PRN
  Administered 2021-01-27 – 2021-01-30 (×4): 5 mg via ORAL
  Filled 2021-01-26 (×5): qty 1

## 2021-01-26 MED ORDER — ACETAMINOPHEN 500 MG PO TABS
1000.0000 mg | ORAL_TABLET | Freq: Three times a day (TID) | ORAL | Status: DC
  Administered 2021-01-26 – 2021-02-04 (×24): 1000 mg via ORAL
  Filled 2021-01-26 (×26): qty 2

## 2021-01-26 MED ORDER — SODIUM CHLORIDE 0.45 % IV SOLN: INTRAVENOUS | Status: DC

## 2021-01-26 MED ORDER — SODIUM ZIRCONIUM CYCLOSILICATE 10 G PO PACK
10.0000 g | PACK | Freq: Three times a day (TID) | ORAL | Status: AC
  Administered 2021-01-26 (×2): 10 g via ORAL
  Filled 2021-01-26 (×2): qty 1

## 2021-01-26 MED FILL — Thrombin For Soln Kit 20000 Unit: CUTANEOUS | Qty: 1 | Status: AC

## 2021-01-26 NOTE — Progress Notes (Signed)
Occupational Therapy Evaluation Patient Details Name: Jane [name] MRN: [id number] DOB: 02-19-1966 Today's Date: 01/26/2021    History of Present Illness The patient is a 55 y.o. year-old w/ hx of schizophrenia, seizure d/o, ESRD on PD, mental retardation (moderate severity), HTN, HL, hypothyroid, bipolar d/o, anemia presented yest to ED w/ abd pain, back pain and leg pain for 1 month. Pt is on home peritoneal dialysis for the last 6 yrs, f/[place] nephrology in [place] W/U showed osteo/ discitis at L5-S1 w/ destructive changes of the anterior superior endplate of S1. Also large lateral epidural abscess causing significant compression of the thecal sac. Pt was taken to surgery for L5-S1 laminectomy and evacuation of epidural abscess.  Postop pt is on 5 N. Pt lives with sister who provides 24/7 care.   Clinical Impression   Pt noted with sever cognitive impairment. Per charts pt was having difficulty ambulating within the home and was experiencing falls. Pt lives with sister who is the primary caregiver and provides 24/7 support. Home set up was obtained from OT note from 2018, needs to be confirmed with sister. I attempted to call sister, with no answer. Pt had difficulty staying alert during session while in sidelying position, extremely lethargic. However did respond intermittently to simple commands. Pt required max +2 to transfer sidelying to sitting. Upon sitting pt started yelling out and was inconsolable. Pt appears to rest comfortably in sidelying position. Positioned with pillows placed between knees and under hand for correct alignment, communicated good positioning to RN. OT to follow acutely. Recommend SNF placement, unless sister can provide 24/7 care with max assistance for functional tranfers, in this case, recommend home health OT.     Follow Up Recommendations  SNF    Equipment Recommendations   (Unsure at this time, need to communicate with sister to confirm current equipment and home  set up)    Recommendations for Other Services       Precautions / Restrictions Precautions Precautions: Back Precaution Booklet Issued: Yes (comment) Precaution Comments: Back handout was put in patients room, recommeneded to review back information with sister/caregiver due to pt's ilmited capacity for health literacy. Required Braces or Orthoses: Spinal Brace Spinal Brace: Lumbar corset Restrictions Weight Bearing Restrictions: No Other Position/Activity Restrictions: No bending, lifting, twisting      Mobility Bed Mobility Overal bed mobility: Needs Assistance Bed Mobility: Sidelying to Sit Rolling: Mod assist Sidelying to sit: +2 for safety/equipment;+2 for physical assistance   Sit to supine: Max assist;+2 for physical assistance;+2 for safety/equipment   General bed mobility comments: Pt was in sidelying upon arrival, with total A to move legs off of bed to initiate sitting EOB pt began crying out. Pr required max verbal and physical A to go from sidelying to sitting EOB and was inconsolable while in sitting position. Max A to go from sitting to sidelying.    Transfers    General transfer comment: Unable to tolerate this session    Balance Overall balance assessment: Needs assistance Sitting-balance support: Bilateral upper extremity supported (Trunk supported in sitting) Sitting balance-Leahy Scale: Poor Sitting balance - Comments: pt dependent on posterior support otherwise would fall backwards                                   ADL either performed or assessed with clinical judgement   ADL Overall ADL's : Needs assistance/impaired;At baseline Eating/Feeding: Maximal assistance;Cueing for safety;Cueing for sequencing;Sitting  Grooming: Maximal assistance;Cueing for sequencing;Sitting   Upper Body Bathing: Maximal assistance;Cueing for safety;Cueing for sequencing;Sitting   Lower Body Bathing: Maximal assistance;+2 for physical assistance;+2 for  safety/equipment;Bed level   Upper Body Dressing : Maximal assistance;Cueing for safety;Cueing for sequencing;Sitting   Lower Body Dressing: Total assistance;+2 for safety/equipment;+2 for physical assistance;Cueing for safety;Cueing for sequencing;Cueing for back precautions;Bed level   Toilet Transfer: Total assistance;+2 for physical assistance;+2 for safety/equipment;Cueing for safety;Cueing for sequencing;Stand-pivot   Toileting- Clothing Manipulation and Hygiene: Total assistance;+2 for physical assistance;+2 for safety/equipment;Cueing for safety;Cueing for sequencing;Bed level       Functional mobility during ADLs: Total assistance;+2 for physical assistance;+2 for safety/equipment General ADL Comments: Pt with limited capacity to perform ADL/education session, pt extremely lethargic and cry out upon sitting EOB. Pt with fucntional PROM of bilat UEs.                  Pertinent Vitals/Pain Pain Assessment: Faces Faces Pain Scale: Hurts whole lot Pain Location: Back Pain Descriptors / Indicators: Crying (Yelling out) Pain Intervention(s): RN gave pain meds during session     Hand Dominance Right   Extremity/Trunk Assessment Upper Extremity Assessment Upper Extremity Assessment: Generalized weakness   Lower Extremity Assessment Lower Extremity Assessment: Generalized weakness   Cervical / Trunk Assessment Cervical / Trunk Assessment: Normal   Communication Communication Communication: Receptive difficulties;Expressive difficulties   Cognition Arousal/Alertness: Lethargic Behavior During Therapy: Agitated Overall Cognitive Status: History of cognitive impairments - at baseline                                 General Comments: Pt intermittently responded to simple one step comands   General Comments  hemovac drain intact            Home Living Family/patient expects to be discharged to:: Private residence Living Arrangements:  Spouse/significant other Available Help at Discharge: Family;Available 24 hours/day (Sister is primary caregiver) Type of Home: House Home Access: Stairs to enter [employer] of Steps: 4 Entrance Stairs-Rails: Right;Left Home Layout: Able to live on main level with bedroom/bathroom     Bathroom Shower/Tub: Teacher, early years/pre: Standard     Home Equipment: None   Additional Comments: Pt unable to respond to questions during session. Home living and prior status needs to be confirmed with sister. This info was taken from OT note 2018      Prior Functioning/Environment Level of Independence: Needs assistance  Gait / Transfers Assistance Needed: per previous notes but was able to ambulate until 3 months ago when back pain started ADL's / Homemaking Assistance Needed: suspect assist from sister due to patients severe cognitive impairment   Comments: Must contact sister for acurate information.        OT Problem List: Decreased strength;Decreased range of motion;Decreased activity tolerance;Impaired balance (sitting and/or standing);Decreased safety awareness;Decreased knowledge of use of DME or AE;Decreased knowledge of precautions;Pain      OT Treatment/Interventions: Self-care/ADL training;DME and/or AE instruction;Therapeutic activities;Patient/family education;Balance training    OT Goals(Current goals can be found in the care plan section) Acute Rehab OT Goals Patient Stated Goal: didn't state OT Goal Formulation: Patient unable to participate in goal setting Time For Goal Achievement: 02/09/21 Potential to Achieve Goals: Fair ADL Goals Pt Will Perform Lower Body Dressing: with mod assist;sit to/from stand Pt Will Transfer to Toilet: with mod assist;squat pivot transfer;bedside commode Additional ADL Goal #1: Pt will respond to step  by step verbal commands to assist care giver in basic ADLs  OT Frequency: Min 2X/week       Co-evaluation  PT/OT/SLP Co-Evaluation/Treatment: Yes Reason for Co-Treatment: Complexity of the patient's impairments (multi-system involvement)        AM-PAC OT "6 Clicks" Daily Activity     Outcome Measure Help from another person eating meals?: A Little Help from another person taking care of personal grooming?: A Lot Help from another person toileting, which includes using toliet, bedpan, or urinal?: Total Help from another person bathing (including washing, rinsing, drying)?: A Lot Help from another person to put on and taking off regular upper body clothing?: A Lot Help from another person to put on and taking off regular lower body clothing?: Total 6 Click Score: 11   End of Session Nurse Communication:  (IV concern, placement of hemovac, safe/comfortable positioning while in bed with pillows and supported)  Activity Tolerance: Patient limited by lethargy;Patient limited by pain;Treatment limited secondary to agitation Patient left: in bed;with call bell/phone within reach;with bed alarm set  OT Visit Diagnosis: Unsteadiness on feet (R26.81);Other abnormalities of gait and mobility (R26.89);Repeated falls (R29.6);Muscle weakness (generalized) (M62.81);Cognitive communication deficit (R41.841);Pain Pain - part of body:  (Back)                Time: 0233-4356 OT Time Calculation (min): 19 min Charges:  OT General Charges $OT Visit: 1 Visit OT Evaluation $OT Eval Moderate Complexity: 1 Mod    Jensyn Cambria A Chaska Hagger 01/26/2021, 3:15 PM

## 2021-01-26 NOTE — Progress Notes (Signed)
Peritoneal dialysis machine has been beeping throughout shift.  It was beeping at shift change.  Contacted HD RN Loma Sousa.  She came to unit and shortly afterwards machine started beeping again.  I contacted Therapist, music at [phone] located on [address] and spoke with Melissa.  After several instructions, the dialysis was cancelled at [place] as per [facility] for PD machine.  Patient has been stable and sister is at the bedside with patient.

## 2021-01-26 NOTE — Consult Note (Signed)
[facility]       Reason for Consult: discitis and epidural abscess   Referring Physician: Dr. Eliseo Squires  Principal Problem:   Discitis of lumbosacral region Active Problems:   Schizoaffective disorder ([place])   RETARDATION, MENTAL NOS   Essential hypertension   End-stage renal disease on peritoneal dialysis ([place])   Seizure disorder ([place])   Pressure injury of skin   . benztropine  1 mg Oral QHS  . Chlorhexidine Gluconate Cloth  6 each Topical Daily  . cinacalcet  30 mg Oral Q breakfast  . cinacalcet  60 mg Oral Q supper  . divalproex  500 mg Oral BID  . docusate sodium  100 mg Oral BID  . FLUoxetine  20 mg Oral Daily  . gentamicin cream  1 application Topical Daily  . iron polysaccharides  150 mg Oral QODAY  . levETIRAcetam  500 mg Oral BID  . levothyroxine  50 mcg Oral Q breakfast  . losartan  25 mg Oral Daily  . multivitamin  1 tablet Oral Daily  . pantoprazole  40 mg Oral Daily  . risperiDONE  2 mg Oral QHS  . sevelamer carbonate  800 mg Oral BID WC  . sevelamer carbonate  800 mg Oral With snacks  . sodium zirconium cyclosilicate  10 g Oral H2D  . vancomycin variable dose per unstable renal function (pharmacist dosing)   Does not apply See admin instructions  . vitamin B-12  500 mcg Oral Daily    Recommendations: Continue with vancomycin and ceftriaxone  picc line once blood cultures negative at 72 hours  Assessment: She has severe discitis/osteomyelitis with multifocal epidural abscesses now s/p laminectomy and I and D.  Culture from the OR is growing Staph epidermidis with sensitivities pending so will continue with broad coverage pending sensitivities.   Antibiotics: Vancomycin and ceftriaxone day 2  HPI: Jane [name] is a 55 y.o. female with a history of ESRD on peritoneal dialysis, schizophrenia, intellectual disability who came in with back pain.  MRI c/w above and taken to the OR for debridement by Dr. Annette Stable.  No history of  back surgery, no reported recent injections.  The patients does not verbalize anything.     Review of Systems:  Unable to be assessed due to patient factors  Past Medical History:  Diagnosis Date  . Abnormal gait   . Anemia   . Bipolar disorder ([place])   . Depression   . ESRD on peritoneal dialysis ([place])   . GERD (gastroesophageal reflux disease)   . Hyperlipemia   . Hypertension   . Hypothyroidism   . Moderately mentally retarded   . Renal disease   . Schizophrenia ([place])   . Seizures ([place])    No recent seizures - no meds    Social History   Tobacco Use  . Smoking status: Never Smoker  . Smokeless tobacco: Never Used  Substance Use Topics  . Alcohol use: No  . Drug use: No    Family History  Problem Relation Age of Onset  . Hypertension Other   . Breast cancer Mother 75    Allergies  Allergen Reactions  . Icodextrin Rash  history from the chart, unobtainable from the patient  Physical Exam: Constitutional: alert  Vitals:   01/26/21 0754 01/26/21 0826  BP: 108/61 102/70  Pulse: (!) 107 (!) 102  Resp: 17 16  Temp: (!) 97.5 F (36.4 C) 98.6 F (37 C)  SpO2: 97%  EYES: anicteric ENMT: no thrush Cardiovascular: Cor RRR Respiratory: clear; Musculoskeletal: no pedal edema noted Skin: negatives: no rash   Lab Results  Component Value Date   WBC 11.3 (H) 01/25/2021   HGB 8.2 (L) 01/25/2021   HCT 25.6 (L) 01/25/2021   MCV 100.0 01/25/2021   PLT 440 (H) 01/25/2021    Lab Results  Component Value Date   CREATININE 7.07 (H) 01/26/2021   BUN 69 (H) 01/26/2021   NA 128 (L) 01/26/2021   K 5.5 (H) 01/26/2021   CL 91 (L) 01/26/2021   CO2 22 01/26/2021    Lab Results  Component Value Date   ALT 11 01/25/2021   AST 14 (L) 01/25/2021   ALKPHOS 319 (H) 01/25/2021     Microbiology: Recent Results (from the past 240 hour(s))  Blood culture (routine x 2)     Status: None (Preliminary result)   Collection Time: 01/25/21  4:37 AM   Specimen: BLOOD   Result Value Ref Range Status   Specimen Description BLOOD RIGHT ANTECUBITAL  Final   Special Requests   Final    BOTTLES DRAWN AEROBIC AND ANAEROBIC Blood Culture adequate volume   Culture   Final    NO GROWTH < 12 HOURS Performed at [facility], [address] [place], [place] [address]    Report Status PENDING  Incomplete  Resp Panel by RT-PCR (Flu A&B, Covid) Nasopharyngeal Swab     Status: None   Collection Time: 01/25/21  4:37 AM   Specimen: Nasopharyngeal Swab; Nasopharyngeal(NP) swabs in vial transport medium  Result Value Ref Range Status   SARS Coronavirus 2 by RT PCR NEGATIVE NEGATIVE Final    Comment: (NOTE) SARS-CoV-2 target nucleic acids are NOT DETECTED.  The SARS-CoV-2 RNA is generally detectable in upper respiratory specimens during the acute phase of infection. The lowest concentration of SARS-CoV-2 viral copies this assay can detect is 138 copies/mL. A negative result does not preclude SARS-Cov-2 infection and should not be used as the sole basis for treatment or other patient management decisions. A negative result may occur with  improper specimen collection/handling, submission of specimen other than nasopharyngeal swab, presence of viral mutation(s) within the areas targeted by this assay, and inadequate number of viral copies(<138 copies/mL). A negative result must be combined with clinical observations, patient history, and epidemiological information. The expected result is Negative.  Fact Sheet for Patients:  EntrepreneurPulse.com.au  Fact Sheet for Healthcare Providers:  IncredibleEmployment.be  This test is no t yet approved or cleared by the [place] FDA and  has been authorized for detection and/or diagnosis of SARS-CoV-2 by FDA under an Emergency Use Authorization (EUA). This EUA will remain  in effect (meaning this test can be used) for the duration of the COVID-19 declaration under Section  564(b)(1) of the Act, 21 U.S.C.section 360bbb-3(b)(1), unless the authorization is terminated  or revoked sooner.       Influenza A by PCR NEGATIVE NEGATIVE Final   Influenza B by PCR NEGATIVE NEGATIVE Final    Comment: (NOTE) The Xpert Xpress SARS-CoV-2/FLU/RSV plus assay is intended as an aid in the diagnosis of influenza from Nasopharyngeal swab specimens and should not be used as a sole basis for treatment. Nasal washings and aspirates are unacceptable for Xpert Xpress SARS-CoV-2/FLU/RSV testing.  Fact Sheet for Patients: EntrepreneurPulse.com.au  Fact Sheet for Healthcare Providers: IncredibleEmployment.be  This test is not yet approved or cleared by the [place] FDA and has been authorized for detection and/or diagnosis of  SARS-CoV-2 by FDA under an Emergency Use Authorization (EUA). This EUA will remain in effect (meaning this test can be used) for the duration of the COVID-19 declaration under Section 564(b)(1) of the Act, 21 U.S.C. section 360bbb-3(b)(1), unless the authorization is terminated or revoked.  Performed at [facility], [place] [address], [place], [place] [address]   Blood culture (routine x 2)     Status: None (Preliminary result)   Collection Time: 01/25/21  4:38 AM   Specimen: BLOOD RIGHT HAND  Result Value Ref Range Status   Specimen Description BLOOD RIGHT HAND  Final   Special Requests   Final    BOTTLES DRAWN AEROBIC AND ANAEROBIC Blood Culture adequate volume   Culture   Final    NO GROWTH < 12 HOURS Performed at [facility], [place] [address], [place], [place] [address]    Report Status PENDING  Incomplete  Aerobic/Anaerobic Culture w Gram Stain (surgical/deep wound)     Status: None (Preliminary result)   Collection Time: 01/25/21  8:08 AM   Specimen: PATH Other; Body Fluid  Result Value Ref Range Status   Specimen Description ABSCESS  Final   Special Requests EPIDUAL ABSCESS SPEC A  Final    Gram Stain NO WBC SEEN NO ORGANISMS SEEN   Final   Culture   Final    NO GROWTH 1 DAY Performed at [facility], [address], [place], [place] [address]    Report Status PENDING  Incomplete  Aerobic Culture w Gram Stain (superficial specimen)     Status: None (Preliminary result)   Collection Time: 01/25/21  8:14 AM   Specimen: Wound  Result Value Ref Range Status   Specimen Description WOUND  Final   Special Requests ABSC  Final   Gram Stain   Final    RARE WBC PRESENT,BOTH PMN AND MONONUCLEAR RARE GRAM POSITIVE COCCI IN PAIRS    Culture   Final    FEW STAPHYLOCOCCUS EPIDERMIDIS SUSCEPTIBILITIES TO FOLLOW Performed at [facility], [place] [address], [place], [place] [address]    Report Status PENDING  Incomplete    Thayer Headings, [facility] [place] [email or web address] 01/26/2021, 1:29 PM

## 2021-01-26 NOTE — Progress Notes (Signed)
Progress Note    Jane [name]  MGQ:[id number] DOB: Mar 16, 1966  DOA: 01/25/2021 PCP: [name], MD    Brief Narrative:     Medical records reviewed and are as summarized below:  Jane [name] is an 55 y.o. female with medical history significant of intellectual disability; seizure d/o; schizophrenia/bipolar; hypothyroidism; HTN; HLD; and ESRD on PD presenting with back pain.  Found to have epidural abscess when taken to the OR by neurosurgery.    Assessment/Plan:   Principal Problem:   Discitis of lumbosacral region Active Problems:   Schizoaffective disorder (HCC)   RETARDATION, MENTAL NOS   Essential hypertension   End-stage renal disease on peritoneal dialysis (HCC)   Seizure disorder (HCC)   Pressure injury of skin    Epidural abscess/Discitis -Patient with progressive back pain, decreased rectal tone, increased difficulty ambulating with increasing falls -MRI showed severe discitis-osteomyelitis at L5-S1 with multifocal epidural abscesses -Neurosurgery consulted and took patient to the OR urgently for decompressive laminectomy and abscess evacuation -Will need antibiotics for 4-6 weeks - currently on Rocephin/Vanc. -With vertebral osteo, needs testing for TB; will add quantiferon gold in addition to HIV. -ID consult to help manage abx  ESRD on PD -Patient on chronic PD -Nephrology is consulting and will order PD -Continue Sensipar, Renvela  mental health issues -Uncertain baseline level of function -Continue home meds - Cogentin, Prozac, Risperdal  Seizure d/o -Continue Depakote, Keppra  Hypothyroidism -Continue Synthroid at current dose as TSH normal  HTN -Continue Cozaar    Family Communication/Anticipated D/C date and plan/Code Status   DVT prophylaxis: Lovenox ordered. Code Status: Full Code.  Disposition Plan: Status is: Inpatient  Remains inpatient appropriate because:Inpatient level of care appropriate due to severity  of illness   Dispo: The patient is from: Home              Anticipated d/c is to: tbd              Patient currently is not medically stable to d/c.   Difficult to place patient No         Medical Consultants:    ID NS.     Subjective:   sleeping  Objective:    Vitals:   01/25/21 2311 01/26/21 0233 01/26/21 0754 01/26/21 0826  BP: (!) 86/58 (!) 100/52 108/61 102/70  Pulse: (!) 117 (!) 116 (!) 107 (!) 102  Resp: 18 18 17 16   Temp: 98.5 F (36.9 C) 98.3 F (36.8 C) (!) 97.5 F (36.4 C) 98.6 F (37 C)  TempSrc:    Oral  SpO2: 92% 100% 97%   Weight:      Height:        Intake/Output Summary (Last 24 hours) at 01/26/2021 1159 Last data filed at 01/25/2021 2100 Gross per 24 hour  Intake 240 ml  Output 10 ml  Net 230 ml   Filed Weights   01/25/21 0122 01/25/21 1716  Weight: 72.5 kg 70.3 kg    Exam:  General: Appearance:     Overweight female in no acute distress     Lungs:     Clear to auscultation bilaterally, respirations unlabored  Heart:    Tachycardic. Normal rhythm. No murmurs, rubs, or gallops.   MS:   All extremities are intact.   Neurologic:  sleeping    Data Reviewed:   I have personally reviewed following labs and imaging studies:  Labs: Labs show the following:   Basic Metabolic Panel: Recent Labs  Lab  01/25/21 0135 01/26/21 0334  NA 129* 128*  K 2.9* 5.5*  CL 86* 91*  CO2 26 22  GLUCOSE 88 80  BUN 64* 69*  CREATININE 7.01* 7.07*  CALCIUM 7.5* 7.2*  PHOS  --  3.8   GFR Estimated Creatinine Clearance: 8.5 mL/min (A) (by C-G formula based on SCr of 7.07 mg/dL (H)). Liver Function Tests: Recent Labs  Lab 01/25/21 0135 01/26/21 0334  AST 14*  --   ALT 11  --   ALKPHOS 319*  --   BILITOT 0.7  --   PROT 7.8  --   ALBUMIN 1.7* 1.4*   Recent Labs  Lab 01/25/21 0135  LIPASE 38   No results for input(s): AMMONIA in the last 168 hours. Coagulation profile No results for input(s): INR, PROTIME in the last 168  hours.  CBC: Recent Labs  Lab 01/25/21 0135  WBC 11.3*  NEUTROABS 9.7*  HGB 8.2*  HCT 25.6*  MCV 100.0  PLT 440*   Cardiac Enzymes: No results for input(s): CKTOTAL, CKMB, CKMBINDEX, TROPONINI in the last 168 hours. BNP (last 3 results) No results for input(s): PROBNP in the last 8760 hours. CBG: No results for input(s): GLUCAP in the last 168 hours. D-Dimer: No results for input(s): DDIMER in the last 72 hours. Hgb A1c: Recent Labs    01/25/21 1745  HGBA1C 5.6   Lipid Profile: No results for input(s): CHOL, HDL, LDLCALC, TRIG, CHOLHDL, LDLDIRECT in the last 72 hours. Thyroid function studies: Recent Labs    01/25/21 1745  TSH 1.096   Anemia work up: No results for input(s): VITAMINB12, FOLATE, FERRITIN, TIBC, IRON, RETICCTPCT in the last 72 hours. Sepsis Labs: Recent Labs  Lab 01/25/21 0135  WBC 11.3*    Microbiology Recent Results (from the past 240 hour(s))  Blood culture (routine x 2)     Status: None (Preliminary result)   Collection Time: 01/25/21  4:37 AM   Specimen: BLOOD  Result Value Ref Range Status   Specimen Description BLOOD RIGHT ANTECUBITAL  Final   Special Requests   Final    BOTTLES DRAWN AEROBIC AND ANAEROBIC Blood Culture adequate volume   Culture   Final    NO GROWTH < 12 HOURS Performed at [facility], [address], [place], [place] [address]    Report Status PENDING  Incomplete  Resp Panel by RT-PCR (Flu A&B, Covid) Nasopharyngeal Swab     Status: None   Collection Time: 01/25/21  4:37 AM   Specimen: Nasopharyngeal Swab; Nasopharyngeal(NP) swabs in vial transport medium  Result Value Ref Range Status   SARS Coronavirus 2 by RT PCR NEGATIVE NEGATIVE Final    Comment: (NOTE) SARS-CoV-2 target nucleic acids are NOT DETECTED.  The SARS-CoV-2 RNA is generally detectable in upper respiratory specimens during the acute phase of infection. The lowest concentration of SARS-CoV-2 viral copies this assay can detect is 138  copies/mL. A negative result does not preclude SARS-Cov-2 infection and should not be used as the sole basis for treatment or other patient management decisions. A negative result may occur with  improper specimen collection/handling, submission of specimen other than nasopharyngeal swab, presence of viral mutation(s) within the areas targeted by this assay, and inadequate number of viral copies(<138 copies/mL). A negative result must be combined with clinical observations, patient history, and epidemiological information. The expected result is Negative.  Fact Sheet for Patients:  EntrepreneurPulse.com.au  Fact Sheet for Healthcare Providers:  IncredibleEmployment.be  This test is no t yet approved  or cleared by the [place] and  has been authorized for detection and/or diagnosis of SARS-CoV-2 by FDA under an Emergency Use Authorization (EUA). This EUA will remain  in effect (meaning this test can be used) for the duration of the COVID-19 declaration under Section 564(b)(1) of the Act, 21 U.S.C.section 360bbb-3(b)(1), unless the authorization is terminated  or revoked sooner.       Influenza A by PCR NEGATIVE NEGATIVE Final   Influenza B by PCR NEGATIVE NEGATIVE Final    Comment: (NOTE) The Xpert Xpress SARS-CoV-2/FLU/RSV plus assay is intended as an aid in the diagnosis of influenza from Nasopharyngeal swab specimens and should not be used as a sole basis for treatment. Nasal washings and aspirates are unacceptable for Xpert Xpress SARS-CoV-2/FLU/RSV testing.  Fact Sheet for Patients: EntrepreneurPulse.com.au  Fact Sheet for Healthcare Providers: IncredibleEmployment.be  This test is not yet approved or cleared by the [place] FDA and has been authorized for detection and/or diagnosis of SARS-CoV-2 by FDA under an Emergency Use Authorization (EUA). This EUA will remain in effect (meaning  this test can be used) for the duration of the COVID-19 declaration under Section 564(b)(1) of the Act, 21 U.S.C. section 360bbb-3(b)(1), unless the authorization is terminated or revoked.  Performed at [facility], [place] [address], [place], [place] [address]   Blood culture (routine x 2)     Status: None (Preliminary result)   Collection Time: 01/25/21  4:38 AM   Specimen: BLOOD RIGHT HAND  Result Value Ref Range Status   Specimen Description BLOOD RIGHT HAND  Final   Special Requests   Final    BOTTLES DRAWN AEROBIC AND ANAEROBIC Blood Culture adequate volume   Culture   Final    NO GROWTH < 12 HOURS Performed at [facility], [place] [address], [place], [place] [address]    Report Status PENDING  Incomplete  Aerobic/Anaerobic Culture w Gram Stain (surgical/deep wound)     Status: None (Preliminary result)   Collection Time: 01/25/21  8:08 AM   Specimen: PATH Other; Body Fluid  Result Value Ref Range Status   Specimen Description ABSCESS  Final   Special Requests EPIDUAL ABSCESS SPEC A  Final   Gram Stain NO WBC SEEN NO ORGANISMS SEEN   Final   Culture   Final    NO GROWTH 1 DAY Performed at [facility], [address] [place], [place] [address]    Report Status PENDING  Incomplete  Aerobic Culture w Gram Stain (superficial specimen)     Status: None (Preliminary result)   Collection Time: 01/25/21  8:14 AM   Specimen: Wound  Result Value Ref Range Status   Specimen Description WOUND  Final   Special Requests ABSC  Final   Gram Stain   Final    RARE WBC PRESENT,BOTH PMN AND MONONUCLEAR RARE GRAM POSITIVE COCCI IN PAIRS    Culture   Final    CULTURE REINCUBATED FOR BETTER GROWTH Performed at [facility], [place] [address], [place], [place] [address]    Report Status PENDING  Incomplete    Procedures and diagnostic studies:  MR Lumbar Spine W Wo Contrast  Result Date: 01/25/2021 CLINICAL DATA:  55 year old female renal failure patient on  peritoneal dialysis. Progressive increasing back pain over the past month, now severe. Newly unable to walk without assistance. Pain radiates to both legs. New difficulty moving bowels. EXAM: MRI LUMBAR SPINE WITHOUT AND WITH CONTRAST TECHNIQUE: Multiplanar and  multiecho pulse sequences of the lumbar spine were obtained without and with intravenous contrast. CONTRAST:  7.59mL GADAVIST GADOBUTROL 1 MMOL/ML IV SOLN COMPARISON:  Lumbar spine CT and CT Abdomen and Pelvis 0257 hours today. FINDINGS: Segmentation:  Normal on the comparison CT. Alignment:  Preserved lumbar lordosis, stable from the earlier CT. Vertebrae: Diffusely abnormal T1 marrow signal in the visible spine and pelvis, most resembling renal osteodystrophy on the CTs earlier today. No marrow edema or acute osseous abnormality from L4 or above. Highly abnormal L5-S1 level.  See details below. Associated marrow edema and enhancement in the sacral ala greater on the left. No definite edema or infection involving the bilateral SI joints at this time. Conus medullaris and cauda equina: Conus extends to the L1 level. No definite lower spinal cord or conus signal abnormality. No abnormal intradural enhancement above L5. There is abnormal dural thickening and enhancement beginning at L4 and extending throughout the sacrum. See details below. Paraspinal and other soft tissues: Confluent presacral edema related to the abnormal L5-S1 level detailed below. But only mild L5-S1 paraspinal phlegmon at this time in addition to the abscesses detailed below. Stable abdominal viscera from the CT earlier today. Disc levels: Lower thoracic levels through L3-L4. Mild ordinary spine degeneration. L4-L5: Circumferential dural thickening and enhancement begins at this level. Subsequent mild spinal stenosis when combined with epidural lipomatosis and disc bulging. L5-S1: Abnormal rim enhancing fluid collection replacing the disc space and extruding anteriorly into the prevertebral  space (series 18, image 10). The prevertebral abscess component extending from the lower L5 through the S2-S3 level encompasses 14 x 43 x 60 mm (AP by transverse by CC) for an estimated volume of 14 mL. Abscess tracks posteriorly through the disc space into the ventral epidural space (also series 18, image 10), and there are slightly discontinuous sacral epidural abscesses tracking to the S2-S3 level left greater than right. See series 20, image 35. Edema and enhancement within the L5 and S1 vertebral bodies both of which appear partially eroded as on CT. Obliterated spinal canal from the superior L5 level caudally. Inflammation and stenosis affecting the bilateral L5 neural foramina. Inflammation in the S1 neural foramina. IMPRESSION: 1. Severe Discitis-Osteomyelitis at L5-S1 with bulky prevertebral and multifocal epidural abscesses. Prevertebral abscess component extending from L5-S3 estimated at 14 mL. Discontinuous L5 and sacral epidural abscesses with dural thickening result in severe spinal stenosis below the L4-L5 level. Bilateral L5 foraminal involvement. Mild stenosis at L4-L5 from dural thickening. 2. Comparatively mild paraspinal soft tissue phlegmon at this time. Generalized presacral edema. Mild marrow edema in the sacral ala, but no definite SI joint infection at this time. 3. Underlying renal osteodystrophy, and stable abdominal viscera as seen by CT today. Electronically Signed   By: Genevie Ann M.D.   On: 01/25/2021 07:09   CT ABDOMEN PELVIS W CONTRAST  Result Date: 01/25/2021 CLINICAL DATA:  Acute nonlocalized abdominal pain. EXAM: CT ABDOMEN AND PELVIS WITH CONTRAST TECHNIQUE: Multidetector CT imaging of the abdomen and pelvis was performed using the standard protocol following bolus administration of intravenous contrast. CONTRAST:  114mL OMNIPAQUE IOHEXOL 300 MG/ML  SOLN COMPARISON:  10/07/2017 FINDINGS: Lower chest: Lung bases are clear allowing for motion artifact. Hepatobiliary: Cholelithiasis  with large stone in the gallbladder. Mild gallbladder distention without wall thickening. No bile duct dilatation. No focal liver lesions. Pancreas: Unremarkable. No pancreatic ductal dilatation or surrounding inflammatory changes. Spleen: 1 cm diameter somewhat poorly defined low-attenuation lesion in the spleen, nonspecific but probably representing a  hemangioma. Adrenals/Urinary Tract: No adrenal gland nodules. Multiple subcentimeter cysts diffusely throughout the kidneys. 1.5 cm diameter exophytic lesion off of the lower pole of the right kidney laterally appears to demonstrate contrast enhancement with washout on the delayed images, suggesting a small solid lesion, possibly adenoma or small renal cell carcinoma. MRI correlation may be useful for further evaluation. Multiple calcifications in both kidneys. No hydronephrosis or hydroureter. Bladder is normal. Stomach/Bowel: Stomach, small bowel, and colon are not abnormally distended. Stool fills the colon. No wall thickening or inflammatory changes. Appendix is not identified. Vascular/Lymphatic: Aortic atherosclerosis. No enlarged abdominal or pelvic lymph nodes. Reproductive: Uterus and bilateral adnexa are unremarkable. Other: Large amount of free fluid in the abdomen. Peritoneal dialysis catheter is present suggesting that this may represent dialysis fluid. Scarring in the anterior abdominal wall is likely postoperative. Musculoskeletal: Diffuse bone sclerosis likely representing renal osteodystrophy. Geographic sclerosis in the femoral heads may indicate avascular necrosis. IMPRESSION: 1. Cholelithiasis with large stone in the gallbladder. Mild gallbladder distention without wall thickening. 2. 1.5 cm diameter exophytic lesion off of the lower pole of the right kidney laterally with apparent contrast enhancement may represent a small solid lesion, possibly adenoma or small renal cell carcinoma. Hemorrhagic cyst would be a secondary possibility. MRI  correlation may be useful for further evaluation. 3. Large amount of free fluid in the abdomen. Peritoneal dialysis catheter is present suggesting that this is likely to represent dialysis fluid. 4. Diffuse bone sclerosis likely representing renal osteodystrophy. Geographic sclerosis in the femoral heads may indicate avascular necrosis. 5. Aortic atherosclerosis. Aortic Atherosclerosis (ICD10-I70.0). Electronically Signed   By: Lucienne Capers M.D.   On: 01/25/2021 03:33   DG Lumbar Spine 1 View  Result Date: 01/25/2021 CLINICAL DATA:  Lumbar spine surgery. EXAM: LUMBAR SPINE - 1 VIEW COMPARISON:  Lumbar spine numbered as per prior MRI. FINDINGS: Metallic marker noted posteriorly at the L5-S1 level. Prominent changes of discitis/osteomyelitis at L5-S1 again noted. IMPRESSION: 1.  Metallic marker noted posteriorly at the L5-S1 level. 2.  Prominent changes of L5-S1 discitis/osteomyelitis again noted. Electronically Signed   By: Marcello Moores  Register   On: 01/25/2021 11:11   CT L-SPINE NO CHARGE  Result Date: 01/25/2021 CLINICAL DATA:  Initial evaluation for acute lower back pain. EXAM: CT LUMBAR SPINE WITHOUT CONTRAST TECHNIQUE: Multidetector CT imaging of the lumbar spine was performed without intravenous contrast administration. Multiplanar CT image reconstructions were also generated. COMPARISON:  Prior CT from 10/07/2017. FINDINGS: Segmentation: Standard. Lowest well-formed disc space labeled the L5-S1 level. Alignment: Physiologic with preservation of the normal lumbar lordosis. No listhesis. Vertebrae: There is abnormal disc space widening with endplate irregularity and erosion about the L5-S1 interspace, concerning for possible osteomyelitis discitis. Erosive changes extend inferiorly to involve the S1 and S2 segments of the sacrum. Fragmentation and lucency at the anterior aspect of the sacrum suspicious for associated fracture (series 4, image 137). There is an irregular hypodense collection encompassing  the L5-S1 disc space with inferior extension into the presacral space, concerning for abscess (series 14, image 40). Overall, this measures approximately 3.5 x 5.1 x 4.9 cm in maximal dimensions, although exact measurements are difficult (series 8, image 131). Additionally, there is abnormal enhancement with suspected epidural abscess within the ventral epidural space posterior to the L5-S1 level (series 14, image 41). The epidural component measures approximately 1.9 x 1.9 x 1.3 cm. Suspected moderate to severe spinal stenosis. Phlegmonous change extends into the bilateral neural foramina at this level. The L5-S1 facets are somewhat  irregular and eroded, and may be involved as well. Possible involvement of the right L4-5 facet noted as well. Elsewhere, bones are diffusely sclerotic and heterogeneous in appearance, presumably related to underlying renal osteodystrophy. Vertebral body height otherwise maintained with no other acute or chronic fracture. No other discrete or worrisome osseous lesions. SI joints are somewhat irregular and eroded bilaterally, similar to previous exam. Possible involvement of septic arthritis could also be contributory. Paraspinal and other soft tissues: Inflammatory stranding noted about the L5-S1 interspace, extending to involve the presacral space inferiorly. Chronic fatty atrophy noted within the posterior paraspinous musculature. Kidneys are atrophic with innumerable cysts bilaterally. Several complex enhancing lesions are seen as well, indeterminate (1.3 cm exophytic lesion at the upper pole the right kidney for example, series 8, image 40). Scattered nonobstructive nephrolithiasis noted bilaterally. No visible ureteral calculi or obstructive uropathy. Moderate aorto bi-iliac atherosclerotic disease noted. Cholelithiasis noted. Ascites partially visualized. Disc levels: L1-2:  Unremarkable. L2-3:  Negative interspace.  Minimal facet spurring.  No stenosis. L3-4: Mild disc bulge with  bilateral facet hypertrophy. No significant spinal stenosis. Foramina remain patent. L4-5: Mild disc bulge. Moderate right worse than left facet hypertrophy with ligament flavum thickening. Possible changes of septic arthritis on the right. Moderate spinal stenosis. Mild bilateral L4 foraminal narrowing. L5-S1: Changes concerning for osteomyelitis discitis. Probable epidural phlegmon and/or abscess within the ventral epidural space. Possible associated/concomitant septic arthritis about the L5-S1 facets bilaterally. Suspected severe spinal stenosis with moderate to severe bilateral foraminal narrowing. IMPRESSION: 1. Findings concerning for osteomyelitis discitis at L5-S1 with approximate 3.5 x 5.1 x 4.9 cm collection involving the disc space and adjacent paraspinous soft tissues. Associated ventral epidural abscess/phlegmon as above. Suspected severe spinal stenosis at this level with moderate to severe bilateral foraminal narrowing. Further assessment with dedicated MRI recommended for further evaluation. 2. Possible associated/concomitant septic arthritis about the L5-S1 and right L4-5 facets. This could also be assessed at MRI. 3. Superimposed lucency/fragmentation at the sacrum, consistent with an associated fracture. 4. Atrophic kidneys with innumerable cysts, consistent with end-stage renal disease. A few of these are complex in nature with possible enhancement, raising the possibility for renal neoplasm. Further evaluation with dedicated renal mass protocol MRI and/or CT recommended for further characterization. 5. Cholelithiasis. 6. Ascites. 7. Aortic Atherosclerosis (ICD10-I70.0). Results were communicated by telephone at the time of interpretation on 01/25/2021 at 3:47 am to provider Montine Circle , who verbally acknowledged these results. Electronically Signed   By: Jeannine Boga M.D.   On: 01/25/2021 03:54   DG Chest Port 1 View  Result Date: 01/25/2021 CLINICAL DATA:  Central line placement  EXAM: PORTABLE CHEST 1 VIEW COMPARISON:  Portable exam 0903 hours compared to 04/21/2016 FINDINGS: RIGHT jugular central venous catheter with tip projecting over RIGHT atrium; consider withdrawal 3.5 cm to place tip at the cavoatrial junction. Normal heart size, mediastinal contours, and pulmonary vascularity. Calcified lymph node AP window. Subsegmental atelectasis at both lung bases. No acute infiltrate, pleural effusion, or pneumothorax. IMPRESSION: No pneumothorax following central line placement. Consider withdrawal of RIGHT jugular line 3.5 cm to place tip at the cavoatrial junction. Electronically Signed   By: Lavonia Dana M.D.   On: 01/25/2021 10:47    Medications:   . benztropine  1 mg Oral QHS  . Chlorhexidine Gluconate Cloth  6 each Topical Daily  . cinacalcet  30 mg Oral Q breakfast  . cinacalcet  60 mg Oral Q supper  . divalproex  500 mg Oral BID  .  docusate sodium  100 mg Oral BID  . FLUoxetine  20 mg Oral Daily  . gentamicin cream  1 application Topical Daily  . iron polysaccharides  150 mg Oral QODAY  . levETIRAcetam  500 mg Oral BID  . levothyroxine  50 mcg Oral Q breakfast  . losartan  25 mg Oral Daily  . multivitamin  1 tablet Oral Daily  . pantoprazole  40 mg Oral Daily  . risperiDONE  2 mg Oral QHS  . sevelamer carbonate  800 mg Oral BID WC  . sevelamer carbonate  800 mg Oral With snacks  . sodium zirconium cyclosilicate  10 g Oral I3X  . vancomycin variable dose per unstable renal function (pharmacist dosing)   Does not apply See admin instructions  . vitamin B-12  500 mcg Oral Daily   Continuous Infusions: . cefTRIAXone (ROCEPHIN)  IV 2 g (01/26/21 0530)  . dialysis solution 1.5% low-MG/low-CA       LOS: 1 day   [facility]   How to contact the [facility] Attending or Consulting provider [place] or covering provider during after hours [place], for this patient?  1. Check the care team in [facility] and look for a) attending/consulting [facility] provider  listed and b) the [facility] team listed 2. Log into [email or web address] and use [place] universal password to access. If you do not have the password, please contact the hospital operator. 3. Locate the [facility] provider you are looking for under [facility] and page to a number that you can be directly reached. 4. If you still have difficulty reaching the provider, please page the [facility] (Director on Call) for the Hospitalists listed on amion for assistance.  01/26/2021, 11:59 AM

## 2021-01-26 NOTE — Progress Notes (Signed)
[name] Progress Note  Subjective: had lots of fibrin and didn't get full PD last night.  K+ 5.5 this am. BP's 741- 287 systolic.  Eating some per RN, answers yes/ no but not conversing.   Vitals:   01/25/21 2311 01/26/21 0233 01/26/21 0754 01/26/21 0826  BP: (!) 86/58 (!) 100/52 108/61 102/70  Pulse: (!) 117 (!) 116 (!) 107 (!) 102  Resp: 18 18 17 16   Temp: 98.5 F (36.9 C) 98.3 F (36.8 C) (!) 97.5 F (36.4 C) 98.6 F (37 C)  TempSrc:    Oral  SpO2: 92% 100% 97%   Weight:      Height:        Exam:   Sleeping, wakes up   does not follow commands   minimally verbal  no jvd  Chest cta bilat  Cor reg no RG  Abd soft ntnd no ascites PD cath RL quadrant   Ext no LE edema   Alert, NF   Home meds:  - sensipar 30am +60 pm/ kdur 20 qam/ renvela 800 ac tid  - risperdal 2 mg hs/ keppra 500 bid/ prozac 20 qd/ depakote dr 500 bid/ cogentin 1 mg hs  - synthroid 50 ug qd/ prilosec 40 qd  - losartan 25 qd  - prn's/ vitamins/ supplements    CXR 4/05 - IMPRESSION: Normal heart size, mediastinal contours, and pulmonary vascularity.     Na 129  K 2.9  CO2 26  bUN 64  Cr 7.01  Alb 1.7  Hb 8.2    OP PD: Dr Jane [name] in [place] is her nephrologist  4 exchanges over night, 2 L each.  Dry wt = 71.5kg  Assessment/ Plan: 1. Discitis/ osteomyelitis/ epidural abscess - of L5- S1 region, sp laminectomy and evacuation of epidural abscess on 01/25/21. Getting IV vanc and Rocephin IV.  2. ESRD - on PD.  Does 2 liter exchanges, 4 every night. Plan PD nightly. Will add heparin to fluids due to fibrin issues last night.   3. Hypokalemia - resolved, K+ high today at 5.5 due to supplementing yesterday. Rx lokelma x 2, recheck in am. Renal diet.  4. HTN/ vol  - cont losartan. Cont all 1.5% as she is under dry wt and looks a bit dry on exam. Starting gentle IVF"s for hydration.  5. Schizophrenia/ bipolar - per pmd 6. Seizure d/o - per pmd 7. MBD ckd - cont renvela, sensipar 8. Anemia  ckd - Hb 8.2 yest,  transfuse if needed for Hb <7      Jane [name] 01/26/2021, 11:37 AM   Recent Labs  Lab 01/25/21 0135 01/26/21 0334  K 2.9* 5.5*  BUN 64* 69*  CREATININE 7.01* 7.07*  CALCIUM 7.5* 7.2*  PHOS  --  3.8  HGB 8.2*  --    Inpatient medications: . benztropine  1 mg Oral QHS  . Chlorhexidine Gluconate Cloth  6 each Topical Daily  . cinacalcet  30 mg Oral Q breakfast  . cinacalcet  60 mg Oral Q supper  . divalproex  500 mg Oral BID  . docusate sodium  100 mg Oral BID  . FLUoxetine  20 mg Oral Daily  . gentamicin cream  1 application Topical Daily  . iron polysaccharides  150 mg Oral QODAY  . levETIRAcetam  500 mg Oral BID  . levothyroxine  50 mcg Oral Q breakfast  . losartan  25 mg Oral Daily  . multivitamin  1 tablet Oral Daily  . pantoprazole  40 mg Oral Daily  . risperiDONE  2 mg Oral QHS  . sevelamer carbonate  800 mg Oral BID WC  . sevelamer carbonate  800 mg Oral With snacks  . sodium chloride flush  3 mL Intravenous Q12H  . sodium zirconium cyclosilicate  10 g Oral I9U  . vancomycin variable dose per unstable renal function (pharmacist dosing)   Does not apply See admin instructions  . vitamin B-12  500 mcg Oral Daily   . sodium chloride    . cefTRIAXone (ROCEPHIN)  IV 2 g (01/26/21 0530)  . dialysis solution 1.5% low-MG/low-CA     acetaminophen **OR** acetaminophen, albuterol, bisacodyl, cyclobenzaprine, dianeal solution for CAPD/CCPD with heparin, HYDROcodone-acetaminophen, HYDROcodone-acetaminophen, HYDROmorphone (DILAUDID) injection, menthol-cetylpyridinium **OR** phenol, ondansetron **OR** ondansetron (ZOFRAN) IV, polyethylene glycol, sodium chloride flush, sodium chloride flush

## 2021-01-26 NOTE — Progress Notes (Signed)
Initial Nutrition Assessment  DOCUMENTATION CODES:  Not applicable  INTERVENTION:  Discuss diet liberalization with MD to regular with low K modifier for poor intake  May need nutrition supplement if intake does not improve as pt becomes more alert.   NUTRITION DIAGNOSIS:  Inadequate oral intake related to lethargy/confusion as evidenced by meal completion < 50%.  GOAL:  Patient will meet greater than or equal to 90% of their needs  MONITOR:  PO intake,Labs  REASON FOR ASSESSMENT:  Consult Assessment of nutrition requirement/status  ASSESSMENT:  Pt admitted with worsening back pain x 1 month and worsening constipation. Imaging in ED concerning for osteomyelitis and a epidural abscess. Neurosurgery consulted. At baseline, lives with her sister who is her caregiver. PMH relevant for cognitive delays, HLD, HTN, ESRD on PD, GERD  Pt sleeping at the time of visit. Snoring, even while performing physical exam. No family present at bedside to provide a nutrition hx. 50% intake x 1 recorded meal in flowsheet but noted breakfast tray untouched at bedside. K high this AM, nephrology believes this is due to over-repletion yesterday. Renal diet added. Will discuss liberalizing back to regular with low K modifer.   4/5 - Op, L5-S1 decompressive laminectomy with evacuation of epidural abscess. Drain placed  CCPD: 1.5% dextrose, 4 fills, 2L bag. Provides 164-204 kcal per day   Medications reviewed and include: cincalcet, colace, niferex, rena-vit, sevelamer carbonate, vitamin b12 Labs reviewed: Na 128, K 5.5, BUN 69, creatinine 7.07,   NUTRITION - FOCUSED PHYSICAL EXAM: Flowsheet Row Most Recent Value  Orbital Region No depletion  Upper Arm Region No depletion  Thoracic and Lumbar Region Mild depletion  Buccal Region No depletion  Temple Region No depletion  Clavicle Bone Region Mild depletion  Clavicle and Acromion Bone Region Mild depletion  Scapular Bone Region Mild depletion   Dorsal Hand Mild depletion  Patellar Region Moderate depletion  Anterior Thigh Region Moderate depletion  Posterior Calf Region Moderate depletion  Edema (RD Assessment) None  Hair Reviewed  Eyes Unable to assess  Mouth Unable to assess  Skin Reviewed  Nails Reviewed      Diet Order:   Diet Order            Diet renal with fluid restriction Fluid restriction: 1200 mL Fluid; Room service appropriate? Yes; Fluid consistency: Thin  Diet effective now                EDUCATION NEEDS:  No education needs have been identified at this time  Skin:  Skin Assessment: Skin Integrity Issues: Skin Integrity Issues:: Stage II,Incisions Stage II: sacrum Incisions: surgical - back  Last BM:  4/5 per RN documentation  Height:  Ht Readings from Last 1 Encounters:  01/25/21 5\' 6"  (1.676 m)    Weight:  Wt Readings from Last 1 Encounters:  01/25/21 70.3 kg    Ideal Body Weight:  59.1 kg  BMI:  Body mass index is 25.01 kg/m.  Estimated Nutritional Needs:   Kcal:  1800-2000  Protein:  90-100  Fluid:  1821mL/d  Ranell Patrick, RD, LDN Clinical Dietitian Pager on [place]

## 2021-01-26 NOTE — Progress Notes (Signed)
Without obvious problem or issue overnight.  She remains uncooperative to examination or history unfortunately.  She seems to be having less pain in her legs.  She has good movement in both legs.  Her drain output is low.  Cultures are pending.  Gram stain with gram-positive cocci in pairs.  Patient with severe osteomyelitis discitis at the L5-S1 level with secondary paravertebral and epidural abscess.  Patient status post laminectomy and evacuation of abscess.  Continue IV antibiotics and may begin to mobilize in a brace.  I would expect patient to have significant lumbar pain with being upright but this will hopefully improve over time.

## 2021-01-26 NOTE — Evaluation (Signed)
Physical Therapy Evaluation Patient Details Name: Jane [name] MRN: [id number] DOB: [birth year] Today's Date: 01/26/2021   History of Present Illness  The patient is a 55 y.o. year-old w/ hx of schizophrenia, seizure d/o, ESRD on PD, mental retardation (moderate severity), HTN, HL, hypothyroid, bipolar d/o, anemia presented yest to ED w/ abd pain, back pain and leg pain for 1 month. Pt is on home peritoneal dialysis for the last 6 yrs, f/b nephrology in [place]. W/U showed osteo/ discitis at L5-S1 w/ destructive changes of the anterior superior endplate of S1. Also large lateral epidural abscess causing significant compression of the thecal sac. Pt was taken to surgery for L5-S1 laminectomy and evacuation of epidural abscess.  Postop pt is on 5 N. Pt lives with sister who provides 24/7 care.    Clinical Impression  Pt admitted with above. Pt with noted severely impaired cognition. Pt did answer simple questions and initiate transfer to EOB however ultimately required maxAx2 due to onset of severe pain, pt crying and unable to communicate her discomfort. Spoke with MD and RN regarding scheduled pain meds vs PRN as pt doesn't have the mental capacity to acknowledge her need for pain medicine or ask for it. Unsure of patients PLOF as pt can not give history and sister not present. At this time pt to benefit from SNF unless daughter can provide maxA and can manage IV antibiotics if patient needs to be one them at discharge. Acute PT to cont to follow.    Follow Up Recommendations SNF;Supervision/Assistance - 24 hour (unless sister can provide maxA)    Equipment Recommendations   (TBD at next venue, would need w/c if sister decided to take pt home)    Recommendations for Other Services       Precautions / Restrictions Precautions Precautions: Back Precaution Booklet Issued: Yes (comment) Precaution Comments: Back handout was put in patients room, recommeneded to review back information with  sister/caregiver due to pt's ilmited capacity for health literacy. Required Braces or Orthoses: Spinal Brace Spinal Brace: Lumbar corset Restrictions Weight Bearing Restrictions: No Other Position/Activity Restrictions: No bending, lifting, twisting      Mobility  Bed Mobility Overal bed mobility: Needs Assistance Bed Mobility: Sidelying to Sit;Rolling;Sit to Supine Rolling: Mod assist Sidelying to sit: Max assist;+2 for physical assistance   Sit to supine: Max assist;+2 for physical assistance   General bed mobility comments: pt given max directional verbal cues, pt did initiate all tasked asked however began crying hysterically with movement, maxA for trunk elevation and LE management    Transfers                 General transfer comment: unable to tolerate this date  Ambulation/Gait             General Gait Details: unable to tolerate at this time  Stairs            Wheelchair Mobility    Modified Rankin (Stroke Patients Only)       Balance Overall balance assessment: Needs assistance Sitting-balance support: Single extremity supported;Bilateral upper extremity supported;Feet supported Sitting balance-Leahy Scale: Poor Sitting balance - Comments: pt dependent on posterior support otherwise would fall backwards                                     Pertinent Vitals/Pain Pain Assessment: Faces Faces Pain Scale: Hurts whole lot Pain Location: Back with movement Pain  Descriptors / Indicators: Crying (Yelling out) Pain Intervention(s): RN gave pain meds during session    [place] expects to be discharged to:: Private residence Living Arrangements: Spouse/significant other Available Help at Discharge: Family;Available 24 hours/day (Sister is primary caregiver) Type of Home: House Home Access: Stairs to enter Entrance Stairs-Rails: Psychiatric nurse of Steps: 4 Home Layout: Able to live on main  level with bedroom/bathroom Home Equipment: None Additional Comments: Pt unable to respond to questions during session. Home living and prior status needs to be confirmed with sister. This info was taken from OT note 2018    Prior Function Level of Independence: Needs assistance   Gait / Transfers Assistance Needed: per previous notes but was able to ambulate until 3 months ago when back pain started  ADL's / Homemaking Assistance Needed: suspect assist from sister due to patients severe cognitive impairment  Comments: Must contact sister for acurate information.     Hand Dominance   Dominant Hand: Right    Extremity/Trunk Assessment   Upper Extremity Assessment Upper Extremity Assessment: Generalized weakness    Lower Extremity Assessment Lower Extremity Assessment: Generalized weakness    Cervical / Trunk Assessment Cervical / Trunk Assessment: Other exceptions (recent back surgery)  Communication   Communication: Receptive difficulties;Expressive difficulties  Cognition Arousal/Alertness: Lethargic Behavior During Therapy: Restless;Agitated Overall Cognitive Status: History of cognitive impairments - at baseline                                 General Comments: pt did respond to questions PT/OT asked and did follow simple commands until we began the transfer in which patient began to cry and couldn't be re-directed      General Comments General comments (skin integrity, edema, etc.): hemovac drain intact    Exercises     Assessment/Plan    PT Assessment Patient needs continued PT services  PT Problem List Decreased strength;Decreased range of motion;Decreased activity tolerance;Decreased balance;Decreased mobility;Decreased knowledge of use of DME;Decreased safety awareness;Pain;Decreased cognition       PT Treatment Interventions DME instruction;Gait training;Stair training;Functional mobility training;Therapeutic activities;Therapeutic  exercise;Balance training;Cognitive remediation;Patient/family education    PT Goals (Current goals can be found in the Care Plan section)  Acute Rehab PT Goals Patient Stated Goal: didn't state PT Goal Formulation: Patient unable to participate in goal setting Time For Goal Achievement: 02/09/21 Potential to Achieve Goals: Fair    Frequency Min 5X/week   Barriers to discharge  (unsure level of assist sister can provide)      Co-evaluation PT/OT/SLP Co-Evaluation/Treatment: Yes Reason for Co-Treatment: Complexity of the patient's impairments (multi-system involvement) PT goals addressed during session: Mobility/safety with mobility OT goals addressed during session: ADL's and self-care;Strengthening/ROM       AM-PAC PT "6 Clicks" Mobility  Outcome Measure Help needed turning from your back to your side while in a flat bed without using bedrails?: A Lot Help needed moving from lying on your back to sitting on the side of a flat bed without using bedrails?: A Lot Help needed moving to and from a bed to a chair (including a wheelchair)?: A Lot Help needed standing up from a chair using your arms (e.g., wheelchair or bedside chair)?: Total Help needed to walk in hospital room?: Total Help needed climbing 3-5 steps with a railing? : Total 6 Click Score: 9    End of Session   Activity Tolerance: Patient limited by pain Patient left:  in bed;with bed alarm set;with call bell/phone within reach (in sidelying with pillow between knees and underarm) Nurse Communication: Mobility status (need for pain meds) PT Visit Diagnosis: Unsteadiness on feet (R26.81);Pain;Other abnormalities of gait and mobility (R26.89);Muscle weakness (generalized) (M62.81)    Time: 5930-1237 PT Time Calculation (min) (ACUTE ONLY): 19 min   Charges:   PT Evaluation $PT Eval Moderate Complexity: 1 Mod          Kittie Plater, PT, DPT Acute Rehabilitation Services Pager #: [phone] Office #:  [phone]   Berline Lopes 01/26/2021, 1:35 PM

## 2021-01-27 DIAGNOSIS — B957 Other staphylococcus as the cause of diseases classified elsewhere: Secondary | ICD-10-CM | POA: Diagnosis not present

## 2021-01-27 DIAGNOSIS — M4647 Discitis, unspecified, lumbosacral region: Secondary | ICD-10-CM | POA: Diagnosis not present

## 2021-01-27 LAB — RENAL FUNCTION PANEL
Albumin: 1.4 g/dL — ABNORMAL LOW (ref 3.5–5.0)
Anion gap: 15 (ref 5–15)
BUN: 69 mg/dL — ABNORMAL HIGH (ref 6–20)
CO2: 22 mmol/L (ref 22–32)
Calcium: 7.2 mg/dL — ABNORMAL LOW (ref 8.9–10.3)
Chloride: 91 mmol/L — ABNORMAL LOW (ref 98–111)
Creatinine, Ser: 7.25 mg/dL — ABNORMAL HIGH (ref 0.44–1.00)
GFR, Estimated: 6 mL/min — ABNORMAL LOW (ref >60)
Glucose, Bld: 73 mg/dL (ref 70–99)
Phosphorus: 4.2 mg/dL (ref 2.5–4.6)
Potassium: 4.4 mmol/L (ref 3.5–5.1)
Sodium: 128 mmol/L — ABNORMAL LOW (ref 135–145)

## 2021-01-27 LAB — AEROBIC CULTURE W GRAM STAIN (SUPERFICIAL SPECIMEN)

## 2021-01-27 LAB — CBC
HCT: 18.5 % — ABNORMAL LOW (ref 36.0–46.0)
Hemoglobin: 5.8 g/dL — CL (ref 12.0–15.0)
MCH: 32.4 pg (ref 26.0–34.0)
MCHC: 31.4 g/dL (ref 30.0–36.0)
MCV: 103.4 fL — ABNORMAL HIGH (ref 80.0–100.0)
Platelets: 359 10*3/uL (ref 150–400)
RBC: 1.79 MIL/uL — ABNORMAL LOW (ref 3.87–5.11)
RDW: 14.6 % (ref 11.5–15.5)
WBC: 9 10*3/uL (ref 4.0–10.5)
nRBC: 0 % (ref 0.0–0.2)

## 2021-01-27 LAB — PREPARE RBC (CROSSMATCH)

## 2021-01-27 MED ORDER — SODIUM CHLORIDE 0.9 % IV SOLN: INTRAVENOUS | Status: DC

## 2021-01-27 MED ORDER — HEPARIN 1000 UNIT/ML FOR PERITONEAL DIALYSIS: 500.0000 [IU] | INTRAMUSCULAR | Status: DC | PRN

## 2021-01-27 MED ORDER — LORAZEPAM 2 MG/ML IJ SOLN
1.0000 mg | Freq: Four times a day (QID) | INTRAMUSCULAR | Status: DC | PRN
  Administered 2021-01-27 – 2021-02-01 (×2): 1 mg via INTRAVENOUS
  Filled 2021-01-27 (×4): qty 1

## 2021-01-27 MED ORDER — CEFAZOLIN SODIUM-DEXTROSE 1-4 GM/50ML-% IV SOLN
1.0000 g | INTRAVENOUS | Status: DC
  Administered 2021-01-27 – 2021-02-04 (×9): 1 g via INTRAVENOUS
  Filled 2021-01-27 (×10): qty 50

## 2021-01-27 MED ORDER — SODIUM CHLORIDE 0.9% IV SOLUTION
Freq: Once | INTRAVENOUS | Status: AC
Start: 2021-01-27 — End: 2021-01-27

## 2021-01-27 MED ORDER — DELFLEX-LC/1.5% DEXTROSE 344 MOSM/L IP SOLN: INTRAPERITONEAL | Status: DC

## 2021-01-27 NOTE — Progress Notes (Signed)
Progress Note    Jane [name]  LOV:[id number] DOB: 08-02-1966  DOA: 01/25/2021 PCP: [name], MD    Brief Narrative:     Medical records reviewed and are as summarized below:  Jane [name] is an 55 y.o. female with medical history significant of intellectual disability; seizure d/o; schizophrenia/bipolar; hypothyroidism; HTN; HLD; and ESRD on PD presenting with back pain.  Found to have epidural abscess when taken to the OR by neurosurgery.    Assessment/Plan:   Principal Problem:   Discitis of lumbosacral region Active Problems:   Schizoaffective disorder (HCC)   RETARDATION, MENTAL NOS   Essential hypertension   End-stage renal disease on peritoneal dialysis (HCC)   Seizure disorder (HCC)   Pressure injury of skin    Epidural abscess/Discitis- staph epidermis -Patient with progressive back pain, decreased rectal tone, increased difficulty ambulating with increasing falls -MRI showed severe discitis-osteomyelitis at L5-S1 with multifocal epidural abscesses -Neurosurgery consulted and took patient to the OR urgently for decompressive laminectomy and abscess evacuation -Will need antibiotics for 4-6 weeks - currently on Rocephin/Vanc. -With vertebral osteo, needs testing for TB; will add quantiferon gold in addition to HIV. -ID consult to help manage abx appreciated  ESRD on PD -Patient on chronic PD -Nephrology is consulting and will order PD -Continue Sensipar, Renvela  mental health issues -Uncertain baseline level of function -Continue home meds - Cogentin, Prozac, Risperdal -periods of agitation and family member unable to be reached, will do low dose ativan IV  Seizure d/o -Continue Depakote, Keppra  Hypothyroidism -Continue Synthroid at current dose as TSH normal  HTN -Continue Cozaar  Anemia of CKD -transfuse 2 units and recheck h/h   Family Communication/Anticipated D/C date and plan/Code Status   DVT prophylaxis:  Lovenox ordered. Code Status: Full Code.  Disposition Plan: Status is: Inpatient  Remains inpatient appropriate because:Inpatient level of care appropriate due to severity of illness   Dispo: The patient is from: Home              Anticipated d/c is to: tbd              Patient currently is not medically stable to d/c.   Difficult to place patient No         Medical Consultants:    ID NS.     Subjective:   Yelling-- can be re-directed at times  Objective:    Vitals:   01/27/21 0758 01/27/21 0935 01/27/21 1015 01/27/21 1030  BP: (!) 143/94 137/71 129/71 137/80  Pulse: (!) 106 (!) 102 (!) 103 (!) 104  Resp:  18 18 17   Temp: 97.6 F (36.4 C) 98 F (36.7 C) 98.2 F (36.8 C) 98.1 F (36.7 C)  TempSrc: Oral Oral Oral Oral  SpO2: 96% 97%  98%  Weight: 70.7 kg     Height:        Intake/Output Summary (Last 24 hours) at 01/27/2021 1203 Last data filed at 01/27/2021 0935 Gross per 24 hour  Intake 1097.22 ml  Output 5 ml  Net 1092.22 ml   Filed Weights   01/25/21 0122 01/25/21 1716 01/27/21 0758  Weight: 72.5 kg 70.3 kg 70.7 kg    Exam:  General: Appearance:     Overweight female screaming     Lungs:     Clear to auscultation bilaterally, respirations unlabored  Heart:    Tachycardic. Normal rhythm. No murmurs, rubs, or gallops.   MS:   All extremities are intact.   Neurologic:  Not able to answer orientation questions, denies pain and says she is hot     Data Reviewed:   I have personally reviewed following labs and imaging studies:  Labs: Labs show the following:   Basic Metabolic Panel: Recent Labs  Lab 01/25/21 0135 01/25/21 0830 01/26/21 0334 01/27/21 0323  NA 129* 128* 128* 128*  K 2.9* 3.6 5.5* 4.4  CL 86*  --  91* 91*  CO2 26  --  22 22  GLUCOSE 88  --  80 73  BUN 64*  --  69* 69*  CREATININE 7.01*  --  7.07* 7.25*  CALCIUM 7.5*  --  7.2* 7.2*  PHOS  --   --  3.8 4.2   GFR Estimated Creatinine Clearance: 8.3 mL/min (A) (by  C-G formula based on SCr of 7.25 mg/dL (H)). Liver Function Tests: Recent Labs  Lab 01/25/21 0135 01/26/21 0334 01/27/21 0323  AST 14*  --   --   ALT 11  --   --   ALKPHOS 319*  --   --   BILITOT 0.7  --   --   PROT 7.8  --   --   ALBUMIN 1.7* 1.4* 1.4*   Recent Labs  Lab 01/25/21 0135  LIPASE 38   No results for input(s): AMMONIA in the last 168 hours. Coagulation profile No results for input(s): INR, PROTIME in the last 168 hours.  CBC: Recent Labs  Lab 01/25/21 0135 01/25/21 0830 01/27/21 0323  WBC 11.3*  --  9.0  NEUTROABS 9.7*  --   --   HGB 8.2* 7.5* 5.8*  HCT 25.6* 22.0* 18.5*  MCV 100.0  --  103.4*  PLT 440*  --  359   Cardiac Enzymes: No results for input(s): CKTOTAL, CKMB, CKMBINDEX, TROPONINI in the last 168 hours. BNP (last 3 results) No results for input(s): PROBNP in the last 8760 hours. CBG: No results for input(s): GLUCAP in the last 168 hours. D-Dimer: No results for input(s): DDIMER in the last 72 hours. Hgb A1c: Recent Labs    01/25/21 1745  HGBA1C 5.6   Lipid Profile: No results for input(s): CHOL, HDL, LDLCALC, TRIG, CHOLHDL, LDLDIRECT in the last 72 hours. Thyroid function studies: Recent Labs    01/25/21 1745  TSH 1.096   Anemia work up: No results for input(s): VITAMINB12, FOLATE, FERRITIN, TIBC, IRON, RETICCTPCT in the last 72 hours. Sepsis Labs: Recent Labs  Lab 01/25/21 0135 01/27/21 0323  WBC 11.3* 9.0    Microbiology Recent Results (from the past 240 hour(s))  Blood culture (routine x 2)     Status: None (Preliminary result)   Collection Time: 01/25/21  4:37 AM   Specimen: BLOOD  Result Value Ref Range Status   Specimen Description BLOOD RIGHT ANTECUBITAL  Final   Special Requests   Final    BOTTLES DRAWN AEROBIC AND ANAEROBIC Blood Culture adequate volume   Culture   Final    NO GROWTH 1 DAY Performed at [facility], [address], [place], [place] [address]    Report Status PENDING  Incomplete   Resp Panel by RT-PCR (Flu A&B, Covid) Nasopharyngeal Swab     Status: None   Collection Time: 01/25/21  4:37 AM   Specimen: Nasopharyngeal Swab; Nasopharyngeal(NP) swabs in vial transport medium  Result Value Ref Range Status   SARS Coronavirus 2 by RT PCR NEGATIVE NEGATIVE Final    Comment: (NOTE) SARS-CoV-2 target nucleic acids are NOT DETECTED.  The SARS-CoV-2 RNA is  generally detectable in upper respiratory specimens during the acute phase of infection. The lowest concentration of SARS-CoV-2 viral copies this assay can detect is 138 copies/mL. A negative result does not preclude SARS-Cov-2 infection and should not be used as the sole basis for treatment or other patient management decisions. A negative result may occur with  improper specimen collection/handling, submission of specimen other than nasopharyngeal swab, presence of viral mutation(s) within the areas targeted by this assay, and inadequate number of viral copies(<138 copies/mL). A negative result must be combined with clinical observations, patient history, and epidemiological information. The expected result is Negative.  Fact Sheet for Patients:  EntrepreneurPulse.com.au  Fact Sheet for Healthcare Providers:  IncredibleEmployment.be  This test is no t yet approved or cleared by the [place] FDA and  has been authorized for detection and/or diagnosis of SARS-CoV-2 by FDA under an Emergency Use Authorization (EUA). This EUA will remain  in effect (meaning this test can be used) for the duration of the COVID-19 declaration under Section 564(b)(1) of the Act, 21 U.S.C.section 360bbb-3(b)(1), unless the authorization is terminated  or revoked sooner.       Influenza A by PCR NEGATIVE NEGATIVE Final   Influenza B by PCR NEGATIVE NEGATIVE Final    Comment: (NOTE) The Xpert Xpress SARS-CoV-2/FLU/RSV plus assay is intended as an aid in the diagnosis of influenza from  Nasopharyngeal swab specimens and should not be used as a sole basis for treatment. Nasal washings and aspirates are unacceptable for Xpert Xpress SARS-CoV-2/FLU/RSV testing.  Fact Sheet for Patients: EntrepreneurPulse.com.au  Fact Sheet for Healthcare Providers: IncredibleEmployment.be  This test is not yet approved or cleared by the [place] FDA and has been authorized for detection and/or diagnosis of SARS-CoV-2 by FDA under an Emergency Use Authorization (EUA). This EUA will remain in effect (meaning this test can be used) for the duration of the COVID-19 declaration under Section 564(b)(1) of the Act, 21 U.S.C. section 360bbb-3(b)(1), unless the authorization is terminated or revoked.  Performed at [facility], [place] [address], [place], [place] [address]   Blood culture (routine x 2)     Status: None (Preliminary result)   Collection Time: 01/25/21  4:38 AM   Specimen: BLOOD RIGHT HAND  Result Value Ref Range Status   Specimen Description BLOOD RIGHT HAND  Final   Special Requests   Final    BOTTLES DRAWN AEROBIC AND ANAEROBIC Blood Culture adequate volume   Culture   Final    NO GROWTH 1 DAY Performed at [facility], [place] [address] [place], [place] [address]    Report Status PENDING  Incomplete  Aerobic/Anaerobic Culture w Gram Stain (surgical/deep wound)     Status: None (Preliminary result)   Collection Time: 01/25/21  8:08 AM   Specimen: PATH Other; Body Fluid  Result Value Ref Range Status   Specimen Description ABSCESS  Final   Special Requests EPIDUAL ABSCESS SPEC A  Final   Gram Stain NO WBC SEEN NO ORGANISMS SEEN   Final   Culture   Final    HOLDING FOR POSSIBLE ANAEROBE Performed at [facility], [address], [place], [place] [address]    Report Status PENDING  Incomplete  Aerobic Culture w Gram Stain (superficial specimen)     Status: None   Collection Time: 01/25/21  8:14 AM   Specimen:  Wound  Result Value Ref Range Status   Specimen Description WOUND  Final   Special Requests ABSC  Final   Gram Stain   Final    RARE WBC PRESENT,BOTH PMN AND MONONUCLEAR RARE GRAM POSITIVE COCCI IN PAIRS Performed at [facility], [place] [address] [place], [place] [address]    Culture FEW STAPHYLOCOCCUS EPIDERMIDIS  Final   Report Status 01/27/2021 FINAL  Final   Organism ID, Bacteria STAPHYLOCOCCUS EPIDERMIDIS  Final      Susceptibility   Staphylococcus epidermidis - MIC*    CIPROFLOXACIN <=0.5 SENSITIVE Sensitive     ERYTHROMYCIN >=8 RESISTANT Resistant     GENTAMICIN <=0.5 SENSITIVE Sensitive     OXACILLIN <=0.25 SENSITIVE Sensitive     TETRACYCLINE <=1 SENSITIVE Sensitive     VANCOMYCIN 1 SENSITIVE Sensitive     TRIMETH/SULFA <=10 SENSITIVE Sensitive     CLINDAMYCIN <=0.25 SENSITIVE Sensitive     RIFAMPIN <=0.5 SENSITIVE Sensitive     Inducible Clindamycin NEGATIVE Sensitive     * FEW STAPHYLOCOCCUS EPIDERMIDIS    Procedures and diagnostic studies:  No results found.  Medications:   . acetaminophen  1,000 mg Oral TID  . benztropine  1 mg Oral QHS  . Chlorhexidine Gluconate Cloth  6 each Topical Daily  . divalproex  500 mg Oral BID  . docusate sodium  100 mg Oral BID  . FLUoxetine  20 mg Oral Daily  . gentamicin cream  1 application Topical Daily  . iron polysaccharides  150 mg Oral QODAY  . levETIRAcetam  500 mg Oral BID  . levothyroxine  50 mcg Oral Q breakfast  . lidocaine  1 patch Transdermal Daily  . losartan  25 mg Oral Daily  . multivitamin  1 tablet Oral Daily  . pantoprazole  40 mg Oral Daily  . risperiDONE  2 mg Oral QHS  . sevelamer carbonate  800 mg Oral BID WC  . sevelamer carbonate  800 mg Oral With snacks  . vancomycin variable dose per unstable renal function (pharmacist dosing)   Does not apply See admin instructions  . vitamin B-12  500 mcg Oral Daily   Continuous Infusions: . sodium chloride 65 mL/hr at 01/27/21 1105  . cefTRIAXone  (ROCEPHIN)  IV 2 g (01/27/21 0604)     LOS: 2 days   Geradine Girt  [facility]   How to contact the [facility] Attending or Consulting provider [place] or covering provider during after hours [place], for this patient?  1. Check the care team in [facility] and look for a) attending/consulting TRH provider listed and b) the [facility] team listed 2. Log into [email or web address] and use [place]'s universal password to access. If you do not have the password, please contact the hospital operator. 3. Locate the [facility] provider you are looking for under [facility] and page to a number that you can be directly reached. 4. If you still have difficulty reaching the provider, please page the [facility] (Director on Call) for the Hospitalists listed on amion for assistance.  01/27/2021, 12:03 PM

## 2021-01-27 NOTE — TOC Initial Note (Addendum)
Transition of Care [facility]) - Initial/Assessment Note    Patient Details  Name: Jane [name] MRN: [id number] Date of Birth: 20-Nov-1965  [facility] CM/SW Contact:    Sharin Mons, RN Phone Number: 01/27/2021, 12:23 PM  Clinical Narrative:                  Presented with c/o back pain ( discitis and epidural abscess ) , hx of intellectual disability; seizure d/o; schizophrenia/bipolar; hypothyroidism; HTN; HLD; and ESRD on PD. From home with sister, [name] (caregiver).      - s/p  L5-S1 laminectomy and evacuation of epidural abscess 4/5  NCM spoke with sister regarding d/c planning. NCM shared PT eval./recommendation: SNF;Supervision/Assistance - 24 hours.  [name] stated pt will not d/c to a SNF. States family will assist with caring for pt once d/c. Agreeable to home health services. NCM to provide Medicare home health star rating list to [employer]. Deborah requesting W/C for pt @ d/c...  Per ID  pt will need 4-6 weeks abx therapy... referral made with [facility]/ [facility], acceptance pending.  TOC team monitoring and will assist with TOC needs.   01/28/2021 1600  NCM spoke with pt's sister by phone to further discuss transition needs.... discussed home IV abx needs. Choice for [facility] offered. [name] does not have a preference for [facility] services and  deferred  this Probation officer to secure needed services. Referral made with [facility] and accepted.   Teaching completed @ bedside by [employer] for home antibiotic therapy with pt's sister [name].  Expected Discharge Plan: [place] Barriers to Discharge: Continued Medical Work up   Patient Goals and CMS Choice     Choice offered to / list presented to : Sibling,Patient  Expected Discharge Plan and Services Expected Discharge Plan: [place]   Discharge Planning Services: CM Consult   Living arrangements for the past 2 months: Single Family Home                                       Prior Living Arrangements/Services Living arrangements for the past 2 months: Single Family Home Lives with:: Siblings Patient language and need for interpreter reviewed:: Yes Do you feel safe going back to the place where you live?: Yes      Need for Family Participation in Patient Care: Yes (Comment) Care giver support system in place?: Yes (comment)   Criminal Activity/Legal Involvement Pertinent to Current Situation/Hospitalization: No - Comment as needed  Activities of Daily Living Home Assistive Devices/Equipment: Other (Comment) (wch) ADL Screening (condition at time of admission) Patient's cognitive ability adequate to safely complete daily activities?: No Is the patient deaf or have difficulty hearing?: No Does the patient have difficulty seeing, even when wearing glasses/contacts?: No Does the patient have difficulty concentrating, remembering, or making decisions?: Yes Patient able to express need for assistance with ADLs?: Yes Does the patient have difficulty dressing or bathing?: Yes Independently performs ADLs?: No Communication: Needs assistance Is this a change from baseline?: Change from baseline, expected to last >3 days Dressing (OT): Needs assistance Is this a change from baseline?: Change from baseline, expected to last >3 days Grooming: Needs assistance Is this a change from baseline?: Change from baseline, expected to last >3 days Feeding: Independent Bathing: Needs assistance Is this a change from baseline?: Change from baseline, expected to last >3  days Toileting: Needs assistance Is this a change from baseline?: Change from baseline, expected to last >3days In/Out Bed: Needs assistance Is this a change from baseline?: Change from baseline, expected to last >3 days Walks in Home: Needs assistance Is this a change from baseline?: Change from baseline, expected to last >3 days Does the patient have difficulty walking or climbing  stairs?: Yes Weakness of Legs: Both Weakness of Arms/Hands: Both  Permission Sought/Granted   Permission granted to share information with : Yes, Verbal Permission Granted  Share Information with NAME: Fidela Salisbury (Sister)  [phone]           Emotional Assessment Appearance:: Appears stated age Attitude/Demeanor/Rapport: Gracious Affect (typically observed): Accepting   Alcohol / Substance Use: Not Applicable Psych Involvement: No (comment)  Admission diagnosis:  Back pain [M54.9] Discitis of lumbosacral region [M46.47] Patient Active Problem List   Diagnosis Date Noted  . Pressure injury of skin 01/26/2021  . Discitis of lumbosacral region 01/25/2021  . Anemia 12/06/2018  . Slurred speech 10/07/2017  . OSA (obstructive sleep apnea) 07/25/2017  . Seizure disorder ([place]) 04/22/2016  . End-stage renal disease on peritoneal dialysis ([place])   . Partial seizure ([place])   . Seizure ([place]) 09/14/2015  . Essential hypertension 10/11/2010  . FOOT PAIN, RIGHT 10/11/2010  . BRONCHITIS, ACUTE WITH MILD BRONCHOSPASM 10/04/2010  . ANKLE SPRAIN, RIGHT 02/20/2010  . WEIGHT GAIN 06/17/2009  . Dermatophytosis of foot 04/16/2009  . VAGINAL DISCHARGE 04/16/2009  . INSOMNIA 04/16/2009  . POLYDIPSIA 04/16/2009  . FREQUENCY, URINARY 04/16/2009  . ANEMIA, IRON DEFICIENCY 12/03/2007  . DYSLIPIDEMIA 06/11/2007  . OBESITY NOS 06/11/2007  . Schizoaffective disorder ([place]) 06/11/2007  . DISORDER, BIPOLAR NOS 06/11/2007  . RETARDATION, MENTAL NOS 06/11/2007  . RENAL INSUFFICIENCY, CHRONIC 06/11/2007  . ACNE NEC 06/11/2007  . URINARY INCONTINENCE, URGE 06/11/2007  . REDUCTION MAMMOPLASTY, HX OF 06/11/2007   PCP:  [name], MD Pharmacy:   [facility] [place], [place] [address] [place] [address] Phone: [phone] Fax: [phone]     Social Determinants of Health (SDOH) Interventions    Readmission Risk  Interventions No flowsheet data found.

## 2021-01-27 NOTE — Progress Notes (Addendum)
[facility] Progress Note  Subjective: PD ran well over night, w/ heparin in all bags. No c/o this am. Is more alert and verbal today. Knows hospital but not year or place.   Vitals:   01/27/21 0630 01/27/21 0653 01/27/21 0758 01/27/21 0935  BP: (!) 154/78 (!) 149/80 (!) 143/94 137/71  Pulse: (!) 108 (!) 108 (!) 106 (!) 102  Resp: 16 19  18   Temp: 98.4 F (36.9 C) 99.4 F (37.4 C) 97.6 F (36.4 C) 98 F (36.7 C)  TempSrc:  Oral Oral Oral  SpO2: 97% 98% 96% 97%  Weight:   70.7 kg   Height:        Exam:   Alert, responds to simple questions   Oriented to hospital , not to date/ year or city  no jvd  Chest cta bilat  Cor reg no RG  Abd soft ntnd no ascites PD cath RL quadrant   Ext no LE edema   Alert, NF   Home meds:  - sensipar 30am +60 pm/ kdur 20 qam/ renvela 800 ac tid  - risperdal 2 mg hs/ keppra 500 bid/ prozac 20 qd/ depakote dr 500 bid/ cogentin 1 mg hs  - synthroid 50 ug qd/ prilosec 40 qd  - losartan 25 qd  - prn's/ vitamins/ supplements    CXR 4/05 - IMPRESSION: Normal heart size, mediastinal contours, and pulmonary vascularity.     Na 129  K 2.9  CO2 26  bUN 64  Cr 7.01  Alb 1.7  Hb 8.2    OP PD: Dr Nancy Marus in [place] is her nephrologist  4 exchanges over night, 2 L each.  Dry wt = 71.5kg  Assessment/ Plan: 1. Discitis/ osteomyelitis/ epidural abscess - sp L5- S1 laminectomy+  epidural abscess evac on 01/25/21. + IV vanc and Rocephin.  2. ESRD - on PD.  Does 2 liter exchanges, 4 every night. Cont PD nightly while here. Add heparin to all fluids due to fibrin issues. No signs of infection, fliud clear.  3. Hyperkalemia - resolved.  4. HTN/ vol  - cont losartan 25 qd. Cont all 1.5% as she is under dry wt and looks a bit dry on exam. Cont IVF"s at 65 cc/hr for vol support 5. Schizophrenia/ bipolar - per pmd 6. Seizure d/o - per pmd 7. MBD ckd - cont renvela, sensipar 8. Anemia ckd / abl post-op: Hb 8.2 > down to 5.8 today. Getting 2u prbc's  today. F/u in am.    Jane [name] 01/27/2021, 10:06 AM   Recent Labs  Lab 01/25/21 0830 01/26/21 0334 01/27/21 0323  K 3.6 5.5* 4.4  BUN  --  69* 69*  CREATININE  --  7.07* 7.25*  CALCIUM  --  7.2* 7.2*  PHOS  --  3.8 4.2  HGB 7.5*  --  5.8*   Inpatient medications: . acetaminophen  1,000 mg Oral TID  . benztropine  1 mg Oral QHS  . Chlorhexidine Gluconate Cloth  6 each Topical Daily  . divalproex  500 mg Oral BID  . docusate sodium  100 mg Oral BID  . FLUoxetine  20 mg Oral Daily  . gentamicin cream  1 application Topical Daily  . iron polysaccharides  150 mg Oral QODAY  . levETIRAcetam  500 mg Oral BID  . levothyroxine  50 mcg Oral Q breakfast  . lidocaine  1 patch Transdermal Daily  . losartan  25 mg Oral Daily  . multivitamin  1 tablet Oral  Daily  . pantoprazole  40 mg Oral Daily  . risperiDONE  2 mg Oral QHS  . sevelamer carbonate  800 mg Oral BID WC  . sevelamer carbonate  800 mg Oral With snacks  . vancomycin variable dose per unstable renal function (pharmacist dosing)   Does not apply See admin instructions  . vitamin B-12  500 mcg Oral Daily   . sodium chloride    . cefTRIAXone (ROCEPHIN)  IV 2 g (01/27/21 0604)   albuterol, bisacodyl, cyclobenzaprine, dianeal solution for CAPD/CCPD with heparin, HYDROmorphone (DILAUDID) injection, menthol-cetylpyridinium **OR** phenol, ondansetron **OR** ondansetron (ZOFRAN) IV, oxyCODONE, polyethylene glycol, sodium chloride flush

## 2021-01-27 NOTE — Progress Notes (Signed)
Physical Therapy Treatment Patient Details Name: Jane [name] MRN: [id number] DOB: 09-14-1966 Today's Date: 01/27/2021    History of Present Illness The patient is a 55 y.o. year-old w/ hx of schizophrenia, seizure d/o, ESRD on PD, mental retardation (moderate severity), HTN, HL, hypothyroid, bipolar d/o, anemia presented yest to ED w/ abd pain, back pain and leg pain for 1 month. Pt is on home peritoneal dialysis for the last 6 yrs, f/[place] neph[place] W/U showed osteo/ discitis at L5-S1 w/ destructive changes of the anterior superior endplate of S1. Also large lateral epidural abscess causing significant compression of the thecal sac. Pt was taken to surgery for L5-S1 laminectomy and evacuation of epidural abscess.  Postop pt is on 5 N. Pt lives with sister who provides 24/7 care.    PT Comments    Pt supine in bed.  She received ativan earlier this day as well as blood transfusion.  Limited session due to bouts of screaming and intermittent episodes of drowsiness.  Pt continues to benefit from skilled rehab in a post acute setting to maximize functional gains before returning home.    Follow Up Recommendations  SNF;Supervision/Assistance - 24 hour (unless sister can provide max assistance.)     Equipment Recommendations  Wheelchair cushion (measurements PT);Wheelchair (measurements PT);Rolling walker with 5" wheels;3in1 (PT)    Recommendations for Other Services       Precautions / Restrictions Precautions Precautions: Back Precaution Booklet Issued: Yes (comment) Required Braces or Orthoses: Spinal Brace Spinal Brace: Lumbar corset;Applied in sitting position Restrictions Weight Bearing Restrictions: No Other Position/Activity Restrictions: No bending, lifting, twisting    Mobility  Bed Mobility Overal bed mobility: Needs Assistance Bed Mobility: Rolling;Sidelying to Sit;Sit to Sidelying Rolling: Max assist Sidelying to sit: Max assist;+2 for safety/equipment      Sit to sidelying: Max assist General bed mobility comments: Pt required max assistance for all bouts of bed mobility,  Pt able to follow facilitation given to maintain log roll/spinal precautions.  Increased pain appearent in sitting as pt crying but would not give a clear answer as to what was wrong.    Transfers Overall transfer level: Needs assistance Equipment used: None Transfers: Sit to/from Stand Sit to Stand: Total assist;+2 safety/equipment         General transfer comment: total assistance face to face blocking B knees to rise into standing.  Pt with flexed hips and knees this session.  Ambulation/Gait Ambulation/Gait assistance:  (unable.)               Stairs             Wheelchair Mobility    Modified Rankin (Stroke Patients Only)       Balance Overall balance assessment: Needs assistance Sitting-balance support: Bilateral upper extremity supported (trunk remains to need support in sitting as patient presents with posterior bias.) Sitting balance-Leahy Scale: Poor Sitting balance - Comments: pt dependent on posterior support otherwise would fall backwards     Standing balance-Leahy Scale: Zero Standing balance comment: total external assistance to lift hips from edge of bed.                            Cognition Arousal/Alertness: Lethargic Behavior During Therapy: WFL for tasks assessed/performed Overall Cognitive Status: History of cognitive impairments - at baseline  General Comments: Pt intermittently responded to simple one step comands      Exercises General Exercises - Upper Extremity Elbow Flexion: AAROM;Both;10 reps;Supine;Limitations Elbow Flexion Limitations: poor ability to reach her mouth with R hand and she is R handed. Digit Composite Flexion: AROM;Both;10 reps;Supine General Exercises - Lower Extremity Heel Slides: AAROM;Both;10 reps;Supine Hip ABduction/ADduction:  AAROM;Both;10 reps;Supine    General Comments        Pertinent Vitals/Pain Pain Assessment: Faces Faces Pain Scale: Hurts even more Pain Location: assume back but will just say "generalized" as she did not report location. Pain Descriptors / Indicators: Crying Pain Intervention(s): Monitored during session;Repositioned    Home Living                      Prior Function            PT Goals (current goals can now be found in the care plan section) Acute Rehab PT Goals Patient Stated Goal: didn't state Potential to Achieve Goals: Fair Progress towards PT goals: Progressing toward goals    Frequency    Min 5X/week      PT Plan Current plan remains appropriate    Co-evaluation              AM-PAC PT "6 Clicks" Mobility   Outcome Measure  Help needed turning from your back to your side while in a flat bed without using bedrails?: A Lot Help needed moving from lying on your back to sitting on the side of a flat bed without using bedrails?: A Lot Help needed moving to and from a bed to a chair (including a wheelchair)?: Total Help needed standing up from a chair using your arms (e.g., wheelchair or bedside chair)?: Total Help needed to walk in hospital room?: Total Help needed climbing 3-5 steps with a railing? : Total 6 Click Score: 8    End of Session Equipment Utilized During Treatment: Gait belt Activity Tolerance: Patient limited by pain Patient left: in bed;with call bell/phone within reach (could not set bed alarm with bed in chair position but sister is at bedside.) Nurse Communication: Mobility status (IV beeping) PT Visit Diagnosis: Unsteadiness on feet (R26.81);Pain;Other abnormalities of gait and mobility (R26.89);Muscle weakness (generalized) (M62.81)     Time: 1531-1601 PT Time Calculation (min) (ACUTE ONLY): 30 min  Charges:  $Therapeutic Exercise: 8-22 mins $Therapeutic Activity: 8-22 mins                     Erasmo Leventhal , PTA Acute  Rehabilitation Services Pager [phone] Office [phone]     Kyle Luppino Eli Hose 01/27/2021, 4:19 PM

## 2021-01-27 NOTE — Progress Notes (Addendum)
   Providing Compassionate, Quality Care - Together   Subjective: Patient crying. She wants to sit up at the side of the bed.  Objective: Vital signs in last 24 hours: Temp:  [97.6 F (36.4 C)-99.4 F (37.4 C)] 98.1 F (36.7 C) (04/07 1030) Pulse Rate:  [84-110] 104 (04/07 1030) Resp:  [16-19] 17 (04/07 1030) BP: (95-154)/(44-94) 137/80 (04/07 1030) SpO2:  [91 %-100 %] 98 % (04/07 1030) Weight:  [70.7 kg] 70.7 kg (04/07 0758)  Intake/Output from previous day: 04/06 0701 - 04/07 0700 In: 968.9 [P.O.:340; I.V.:340; IV Piggyback:263.9] Out: 5 [Drains:5] Intake/Output this shift: Total I/O In: 453.3 [P.O.:120; Blood:333.3] Out: -   Alert PERRLA MAE, Strength appears intact Incision is covered with Honeycomb dressing and Steri Strips; Dressing is clean, dry, and intact Drain in place. Minimal output overnight.  Lab Results: Recent Labs    01/25/21 0135 01/25/21 0830 01/27/21 0323  WBC 11.3*  --  9.0  HGB 8.2* 7.5* 5.8*  HCT 25.6* 22.0* 18.5*  PLT 440*  --  359   BMET Recent Labs    01/26/21 0334 01/27/21 0323  NA 128* 128*  K 5.5* 4.4  CL 91* 91*  CO2 22 22  GLUCOSE 80 73  BUN 69* 69*  CREATININE 7.07* 7.25*  CALCIUM 7.2* 7.2*    Studies/Results: No results found.  Assessment/Plan: Patient underwent L5-S1 decompressive laminectomy with evacuation of epidural abscess on 01/25/2021 by Dr. Annette Stable.   LOS: 2 days    -Continue to mobilize as tolerated. -Pain control. -ABX per ID. Presently on Rocephin and vancomycin. Will need 4-6 weeks abx therapy. -Maintain drain through the weekend.  Viona Gilmore, DNP, AGNP-C Nurse Practitioner  [facility] [place] [address], [place], [place], [place] [address] P: [phone]    F: [phone]  01/27/2021, 10:15 AM

## 2021-01-27 NOTE — Progress Notes (Signed)
[facility] for Infectious Disease  Date of Admission:  01/25/2021     Total days of antibiotics 4         ASSESSMENT:  Ms. Mateja cultures show Staphylococcus epidermidis to be oxacillin sensitive and will change her to cefazolin. Blood cultures from 4/5 remain without growth to date.  She will need 6 weeks of treatment using 4/5 as Day 1 with end date planned for 5/17. Will need central line prior to discharge. Wound care and drain management per neurosurgery.    PLAN:  1. Narrow antibiotics to Cefazolin.  2. Continue treatment for 6 weeks through 5/17.  3. Post-surgical care per neurosurgery.  4. PD per nephrology.   Principal Problem:   Discitis of lumbosacral region Active Problems:   Schizoaffective disorder (HCC)   RETARDATION, MENTAL NOS   Essential hypertension   End-stage renal disease on peritoneal dialysis (HCC)   Seizure disorder (HCC)   Pressure injury of skin   . acetaminophen  1,000 mg Oral TID  . benztropine  1 mg Oral QHS  . Chlorhexidine Gluconate Cloth  6 each Topical Daily  . divalproex  500 mg Oral BID  . docusate sodium  100 mg Oral BID  . FLUoxetine  20 mg Oral Daily  . gentamicin cream  1 application Topical Daily  . iron polysaccharides  150 mg Oral QODAY  . levETIRAcetam  500 mg Oral BID  . levothyroxine  50 mcg Oral Q breakfast  . lidocaine  1 patch Transdermal Daily  . losartan  25 mg Oral Daily  . multivitamin  1 tablet Oral Daily  . pantoprazole  40 mg Oral Daily  . risperiDONE  2 mg Oral QHS  . sevelamer carbonate  800 mg Oral BID WC  . sevelamer carbonate  800 mg Oral With snacks  . vancomycin variable dose per unstable renal function (pharmacist dosing)   Does not apply See admin instructions  . vitamin B-12  500 mcg Oral Daily    SUBJECTIVE:  Afebrile overnight with no acute events. Wanting to be turned to the other side.   Allergies  Allergen Reactions  . Icodextrin Rash     Review of Systems: Review of  Systems  Constitutional: Negative for chills, fever and weight loss.  Respiratory: Negative for cough, shortness of breath and wheezing.   Cardiovascular: Negative for chest pain and leg swelling.  Gastrointestinal: Negative for abdominal pain, constipation, diarrhea, nausea and vomiting.  Musculoskeletal: Positive for back pain.  Skin: Negative for rash.      OBJECTIVE: Vitals:   01/27/21 1015 01/27/21 1030 01/27/21 1345 01/27/21 1403  BP: 129/71 137/80 (!) 148/60 (!) 151/81  Pulse: (!) 103 (!) 104 (!) 114 (!) 108  Resp: 18 17 18 18   Temp: 98.2 F (36.8 C) 98.1 F (36.7 C) 98.1 F (36.7 C) 98.8 F (37.1 C)  TempSrc: Oral Oral Oral Oral  SpO2:  98% 97% 94%  Weight:      Height:       Body mass index is 25.16 kg/m.  Physical Exam Constitutional:      General: She is not in acute distress.    Appearance: She is well-developed.     Comments: Lying in bed on the left side; upset.   Cardiovascular:     Rate and Rhythm: Normal rate and regular rhythm.     Heart sounds: Normal heart sounds.  Pulmonary:     Effort: Pulmonary effort is normal.     Breath sounds:  Normal breath sounds.  Musculoskeletal:     Comments: Honeycomb dressing in place with drain.   Skin:    General: Skin is warm and dry.  Neurological:     Mental Status: She is alert.     Lab Results Lab Results  Component Value Date   WBC 9.0 01/27/2021   HGB 5.8 (LL) 01/27/2021   HCT 18.5 (L) 01/27/2021   MCV 103.4 (H) 01/27/2021   PLT 359 01/27/2021    Lab Results  Component Value Date   CREATININE 7.25 (H) 01/27/2021   BUN 69 (H) 01/27/2021   NA 128 (L) 01/27/2021   K 4.4 01/27/2021   CL 91 (L) 01/27/2021   CO2 22 01/27/2021    Lab Results  Component Value Date   ALT 11 01/25/2021   AST 14 (L) 01/25/2021   ALKPHOS 319 (H) 01/25/2021   BILITOT 0.7 01/25/2021     Microbiology: Recent Results (from the past 240 hour(s))  Blood culture (routine x 2)     Status: None (Preliminary result)    Collection Time: 01/25/21  4:37 AM   Specimen: BLOOD  Result Value Ref Range Status   Specimen Description BLOOD RIGHT ANTECUBITAL  Final   Special Requests   Final    BOTTLES DRAWN AEROBIC AND ANAEROBIC Blood Culture adequate volume   Culture   Final    NO GROWTH 2 DAYS Performed at [facility], [address] [place],  [address]    Report Status PENDING  Incomplete  Resp Panel by RT-PCR (Flu A&B, Covid) Nasopharyngeal Swab     Status: None   Collection Time: 01/25/21  4:37 AM   Specimen: Nasopharyngeal Swab; Nasopharyngeal(NP) swabs in vial transport medium  Result Value Ref Range Status   SARS Coronavirus 2 by RT PCR NEGATIVE NEGATIVE Final    Comment: (NOTE) SARS-CoV-2 target nucleic acids are NOT DETECTED.  The SARS-CoV-2 RNA is generally detectable in upper respiratory specimens during the acute phase of infection. The lowest concentration of SARS-CoV-2 viral copies this assay can detect is 138 copies/mL. A negative result does not preclude SARS-Cov-2 infection and should not be used as the sole basis for treatment or other patient management decisions. A negative result may occur with  improper specimen collection/handling, submission of specimen other than nasopharyngeal swab, presence of viral mutation(s) within the areas targeted by this assay, and inadequate number of viral copies(<138 copies/mL). A negative result must be combined with clinical observations, patient history, and epidemiological information. The expected result is Negative.  Fact Sheet for Patients:  EntrepreneurPulse.com.au  Fact Sheet for Healthcare Providers:  IncredibleEmployment.be  This test is no t yet approved or cleared by the [place] FDA and  has been authorized for detection and/or diagnosis of SARS-CoV-2 by FDA under an Emergency Use Authorization (EUA). This EUA will remain  in effect (meaning this test can be used) for the  duration of the COVID-19 declaration under Section 564(b)(1) of the Act, 21 U.S.C.section 360bbb-3(b)(1), unless the authorization is terminated  or revoked sooner.       Influenza A by PCR NEGATIVE NEGATIVE Final   Influenza B by PCR NEGATIVE NEGATIVE Final    Comment: (NOTE) The Xpert Xpress SARS-CoV-2/FLU/RSV plus assay is intended as an aid in the diagnosis of influenza from Nasopharyngeal swab specimens and should not be used as a sole basis for treatment. Nasal washings and aspirates are unacceptable for Xpert Xpress SARS-CoV-2/FLU/RSV testing.  Fact Sheet for Patients: EntrepreneurPulse.com.au  Fact Sheet for  Healthcare Providers: IncredibleEmployment.be  This test is not yet approved or cleared by the [place] and has been authorized for detection and/or diagnosis of SARS-CoV-2 by FDA under an Emergency Use Authorization (EUA). This EUA will remain in effect (meaning this test can be used) for the duration of the COVID-19 declaration under Section 564(b)(1) of the Act, 21 U.S.C. section 360bbb-3(b)(1), unless the authorization is terminated or revoked.  Performed at [facility], [place] [address] [place], [place] [address]   Blood culture (routine x 2)     Status: None (Preliminary result)   Collection Time: 01/25/21  4:38 AM   Specimen: BLOOD RIGHT HAND  Result Value Ref Range Status   Specimen Description BLOOD RIGHT HAND  Final   Special Requests   Final    BOTTLES DRAWN AEROBIC AND ANAEROBIC Blood Culture adequate volume   Culture   Final    NO GROWTH 2 DAYS Performed at [facility], [place] [address] [place], [place] [address]    Report Status PENDING  Incomplete  Aerobic/Anaerobic Culture w Gram Stain (surgical/deep wound)     Status: None (Preliminary result)   Collection Time: 01/25/21  8:08 AM   Specimen: PATH Other; Body Fluid  Result Value Ref Range Status   Specimen Description ABSCESS  Final    Special Requests EPIDUAL ABSCESS SPEC A  Final   Gram Stain NO WBC SEEN NO ORGANISMS SEEN   Final   Culture   Final    HOLDING FOR POSSIBLE ANAEROBE Performed at [facility], [address], [place], [place] [address]    Report Status PENDING  Incomplete  Aerobic Culture w Gram Stain (superficial specimen)     Status: None   Collection Time: 01/25/21  8:14 AM   Specimen: Wound  Result Value Ref Range Status   Specimen Description WOUND  Final   Special Requests ABSC  Final   Gram Stain   Final    RARE WBC PRESENT,BOTH PMN AND MONONUCLEAR RARE GRAM POSITIVE COCCI IN PAIRS Performed at [facility], [place] [address], [place], [place] [address]    Culture FEW STAPHYLOCOCCUS EPIDERMIDIS  Final   Report Status 01/27/2021 FINAL  Final   Organism ID, Bacteria STAPHYLOCOCCUS EPIDERMIDIS  Final      Susceptibility   Staphylococcus epidermidis - MIC*    CIPROFLOXACIN <=0.5 SENSITIVE Sensitive     ERYTHROMYCIN >=8 RESISTANT Resistant     GENTAMICIN <=0.5 SENSITIVE Sensitive     OXACILLIN <=0.25 SENSITIVE Sensitive     TETRACYCLINE <=1 SENSITIVE Sensitive     VANCOMYCIN 1 SENSITIVE Sensitive     TRIMETH/SULFA <=10 SENSITIVE Sensitive     CLINDAMYCIN <=0.25 SENSITIVE Sensitive     RIFAMPIN <=0.5 SENSITIVE Sensitive     Inducible Clindamycin NEGATIVE Sensitive     * FEW STAPHYLOCOCCUS EPIDERMIDIS     Terri Piedra, NP [facility]  01/27/2021  2:38 PM

## 2021-01-27 NOTE — Progress Notes (Signed)
PHARMACY CONSULT NOTE FOR:  OUTPATIENT  PARENTERAL ANTIBIOTIC THERAPY (OPAT)  Indication: MSSE discitis Regimen: Cefazolin 1g IV every 24 hours End date: 03/08/2021  IV antibiotic discharge orders are pended. To discharging provider:  please sign these orders via discharge navigator,  Select New Orders & click on the button choice - Manage This Unsigned Work.    Thank you for allowing pharmacy to be a part of this patient's care.  Mercy Riding, PharmD PGY1 Acute Care Pharmacy Resident

## 2021-01-28 DIAGNOSIS — F259 Schizoaffective disorder, unspecified: Secondary | ICD-10-CM | POA: Diagnosis not present

## 2021-01-28 DIAGNOSIS — N186 End stage renal disease: Secondary | ICD-10-CM

## 2021-01-28 DIAGNOSIS — M4647 Discitis, unspecified, lumbosacral region: Secondary | ICD-10-CM | POA: Diagnosis not present

## 2021-01-28 DIAGNOSIS — F941 Reactive attachment disorder of childhood: Secondary | ICD-10-CM

## 2021-01-28 DIAGNOSIS — I10 Essential (primary) hypertension: Secondary | ICD-10-CM | POA: Diagnosis not present

## 2021-01-28 DIAGNOSIS — L89309 Pressure ulcer of unspecified buttock, unspecified stage: Secondary | ICD-10-CM

## 2021-01-28 DIAGNOSIS — G4089 Other seizures: Secondary | ICD-10-CM

## 2021-01-28 DIAGNOSIS — Z992 Dependence on renal dialysis: Secondary | ICD-10-CM

## 2021-01-28 LAB — RENAL FUNCTION PANEL
Albumin: 1.3 g/dL — ABNORMAL LOW (ref 3.5–5.0)
Anion gap: 17 — ABNORMAL HIGH (ref 5–15)
BUN: 63 mg/dL — ABNORMAL HIGH (ref 6–20)
CO2: 22 mmol/L (ref 22–32)
Calcium: 7.2 mg/dL — ABNORMAL LOW (ref 8.9–10.3)
Chloride: 93 mmol/L — ABNORMAL LOW (ref 98–111)
Creatinine, Ser: 7.21 mg/dL — ABNORMAL HIGH (ref 0.44–1.00)
GFR, Estimated: 6 mL/min — ABNORMAL LOW (ref >60)
Glucose, Bld: 71 mg/dL (ref 70–99)
Phosphorus: 5 mg/dL — ABNORMAL HIGH (ref 2.5–4.6)
Potassium: 4.4 mmol/L (ref 3.5–5.1)
Sodium: 132 mmol/L — ABNORMAL LOW (ref 135–145)

## 2021-01-28 LAB — QUANTIFERON-TB GOLD PLUS (RQFGPL)
QuantiFERON Mitogen Value: 0.53 IU/mL
QuantiFERON Nil Value: 0.15 IU/mL
QuantiFERON TB1 Ag Value: 0.1 IU/mL
QuantiFERON TB2 Ag Value: 0.13 IU/mL

## 2021-01-28 LAB — CBC
HCT: 25.2 % — ABNORMAL LOW (ref 36.0–46.0)
Hemoglobin: 8.3 g/dL — ABNORMAL LOW (ref 12.0–15.0)
MCH: 31.3 pg (ref 26.0–34.0)
MCHC: 32.9 g/dL (ref 30.0–36.0)
MCV: 95.1 fL (ref 80.0–100.0)
Platelets: 348 10*3/uL (ref 150–400)
RBC: 2.65 MIL/uL — ABNORMAL LOW (ref 3.87–5.11)
RDW: 18.2 % — ABNORMAL HIGH (ref 11.5–15.5)
WBC: 9.9 10*3/uL (ref 4.0–10.5)
nRBC: 0 % (ref 0.0–0.2)

## 2021-01-28 LAB — BPAM RBC
Blood Product Expiration Date: 202204292359
Blood Product Expiration Date: 202204292359
Blood Product Expiration Date: 202204292359
ISSUE DATE / TIME: 202204060737
ISSUE DATE / TIME: 202204070614
ISSUE DATE / TIME: 202204071003
Unit Type and Rh: 5100
Unit Type and Rh: 5100
Unit Type and Rh: 5100

## 2021-01-28 LAB — TYPE AND SCREEN
ABO/RH(D): O POS
Antibody Screen: NEGATIVE
Unit division: 0
Unit division: 0
Unit division: 0

## 2021-01-28 LAB — QUANTIFERON-TB GOLD PLUS: QuantiFERON-TB Gold Plus: UNDETERMINED — AB

## 2021-01-28 MED ORDER — DICLOFENAC SODIUM 1 % EX GEL
2.0000 g | Freq: Four times a day (QID) | CUTANEOUS | Status: DC
  Administered 2021-01-28 – 2021-02-04 (×28): 2 g via TOPICAL
  Filled 2021-01-28: qty 100

## 2021-01-28 MED ORDER — DELFLEX-LC/1.5% DEXTROSE 344 MOSM/L IP SOLN: INTRAPERITONEAL | Status: DC

## 2021-01-28 MED ORDER — FLEET ENEMA 7-19 GM/118ML RE ENEM
1.0000 | ENEMA | Freq: Once | RECTAL | Status: AC
  Administered 2021-01-28: 1 via RECTAL
  Filled 2021-01-28: qty 1

## 2021-01-28 MED ORDER — HEPARIN 1000 UNIT/ML FOR PERITONEAL DIALYSIS
INTRAPERITONEAL | Status: DC
  Filled 2021-01-28 (×19): qty 5000

## 2021-01-28 NOTE — Progress Notes (Addendum)
Progress Note    Jane [name]  VVO:[id number] DOB: Aug 08, 1966  DOA: 01/25/2021 PCP: [name], MD    Brief Narrative:     Medical records reviewed and are as summarized below:  Jane [name] is an 55 y.o. female with medical history significant of intellectual disability; seizure d/o; schizophrenia/bipolar; hypothyroidism; HTN; HLD; and ESRD on PD presenting with back pain.  Found to have epidural abscess when taken to the OR by neurosurgery.    Assessment/Plan:   Principal Problem:   Discitis of lumbosacral region Active Problems:   Schizoaffective disorder (HCC)   RETARDATION, MENTAL NOS   Essential hypertension   End-stage renal disease on peritoneal dialysis (HCC)   Seizure disorder (HCC)   Pressure injury of skin    Epidural abscess/Discitis- MSSE -Patient with progressive back pain, decreased rectal tone, increased difficulty ambulating with increasing falls -MRI showed severe discitis-osteomyelitis at L5-S1 with multifocal epidural abscesses -Neurosurgery consulted and took patient to the OR urgently for decompressive laminectomy and abscess evacuation -Will need antibiotics for 4-6 weeks - currently on Rocephin/Vanc. -With vertebral osteo, needs testing for TB; will add quantiferon gold in addition to HIV. -ID consult to help manage abx appreciated: Continue Cefazolin through 5/17 -will need IJ PICC Line via IR prior to d/c  ESRD on PD -Patient on chronic PD -Nephrology is consulting and will order PD -Continue Sensipar, Renvela  mental health issues -Uncertain baseline level of function -Continue home meds - Cogentin, Prozac, Risperdal -periods of agitation: will ask family to stay and will do low dose ativan IV  Seizure d/o -Continue Depakote, Keppra  Hypothyroidism -Continue Synthroid at current dose as TSH normal  HTN -Continue Cozaar  Anemia of CKD -s/p 2 units   Family Communication/Anticipated D/C date and plan/Code  Status   DVT prophylaxis: Lovenox ordered. Code Status: Full Code.  Disposition Plan: Status is: Inpatient  Remains inpatient appropriate because:Inpatient level of care appropriate due to severity of illness   Dispo: The patient is from: Home              Anticipated d/c is to: tbd              Patient currently is not medically stable to d/c.   Difficult to place patient No    Called sister no answer: family will need to give her the IV abx, patient will need IJ PICC-- currently agitated at times so not sure IR could do just yet     Addendum: spoke with OT: sister says she is comfortable providing the level of care Jane [name] is requiring right now. She explained that Jane [name] is performing similarly to how she was prior to admission, minus the yelling/discomfort. The only change is her RUE function, I tested it today, it seems that she does not have much AROM of her shoulder against gravity. Sister said this is different from baseline, as she was feeding herself prior.  -can consider cervical spine x ray-- will do voltaren gel    Medical Consultants:    ID NS.     Subjective:   Yelling still at times-- wanting to be turned on her side  Objective:    Vitals:   01/27/21 1704 01/27/21 2155 01/28/21 0436 01/28/21 0650  BP: (!) 146/68 138/69 (!) 113/59 126/84  Pulse: (!) 102 (!) 107 (!) 105 (!) 109  Resp: 18 16 15 16   Temp: 97.7 F (36.5 C)  98.5 F (36.9 C) 98.7 F (37.1 C)  TempSrc: Oral  Oral Oral  SpO2: 99% 97% 94% 96%  Weight: 70.3 kg   70.6 kg  Height:        Intake/Output Summary (Last 24 hours) at 01/28/2021 1203 Last data filed at 01/28/2021 6440 Gross per 24 hour  Intake 50 ml  Output 30 ml  Net 20 ml   Filed Weights   01/27/21 0758 01/27/21 1704 01/28/21 0650  Weight: 70.7 kg 70.3 kg 70.6 kg    Exam:  General: Appearance:     Overweight female yelling out     Lungs:     respirations unlabored  Heart:    Tachycardic. Normal rhythm. No murmurs,  rubs, or gallops.   MS:   All extremities are intact.   Neurologic:   Awake, alert, oriented x 3. No apparent focal neurological           defect.     Data Reviewed:   I have personally reviewed following labs and imaging studies:  Labs: Labs show the following:   Basic Metabolic Panel: Recent Labs  Lab 01/25/21 0135 01/25/21 0830 01/26/21 0334 01/27/21 0323 01/28/21 0417  NA 129* 128* 128* 128* 132*  K 2.9* 3.6 5.5* 4.4 4.4  CL 86*  --  91* 91* 93*  CO2 26  --  22 22 22   GLUCOSE 88  --  80 73 71  BUN 64*  --  69* 69* 63*  CREATININE 7.01*  --  7.07* 7.25* 7.21*  CALCIUM 7.5*  --  7.2* 7.2* 7.2*  PHOS  --   --  3.8 4.2 5.0*   GFR Estimated Creatinine Clearance: 8.4 mL/min (A) (by C-G formula based on SCr of 7.21 mg/dL (H)). Liver Function Tests: Recent Labs  Lab 01/25/21 0135 01/26/21 0334 01/27/21 0323 01/28/21 0417  AST 14*  --   --   --   ALT 11  --   --   --   ALKPHOS 319*  --   --   --   BILITOT 0.7  --   --   --   PROT 7.8  --   --   --   ALBUMIN 1.7* 1.4* 1.4* 1.3*   Recent Labs  Lab 01/25/21 0135  LIPASE 38   No results for input(s): AMMONIA in the last 168 hours. Coagulation profile No results for input(s): INR, PROTIME in the last 168 hours.  CBC: Recent Labs  Lab 01/25/21 0135 01/25/21 0830 01/27/21 0323 01/28/21 0417  WBC 11.3*  --  9.0 9.9  NEUTROABS 9.7*  --   --   --   HGB 8.2* 7.5* 5.8* 8.3*  HCT 25.6* 22.0* 18.5* 25.2*  MCV 100.0  --  103.4* 95.1  PLT 440*  --  359 348   Cardiac Enzymes: No results for input(s): CKTOTAL, CKMB, CKMBINDEX, TROPONINI in the last 168 hours. BNP (last 3 results) No results for input(s): PROBNP in the last 8760 hours. CBG: No results for input(s): GLUCAP in the last 168 hours. D-Dimer: No results for input(s): DDIMER in the last 72 hours. Hgb A1c: Recent Labs    01/25/21 1745  HGBA1C 5.6   Lipid Profile: No results for input(s): CHOL, HDL, LDLCALC, TRIG, CHOLHDL, LDLDIRECT in the last 72  hours. Thyroid function studies: Recent Labs    01/25/21 1745  TSH 1.096   Anemia work up: No results for input(s): VITAMINB12, FOLATE, FERRITIN, TIBC, IRON, RETICCTPCT in the last 72 hours. Sepsis Labs: Recent Labs  Lab 01/25/21 0135 01/27/21 0323 01/28/21 0417  WBC  11.3* 9.0 9.9    Microbiology Recent Results (from the past 240 hour(s))  Blood culture (routine x 2)     Status: None (Preliminary result)   Collection Time: 01/25/21  4:37 AM   Specimen: BLOOD  Result Value Ref Range Status   Specimen Description BLOOD RIGHT ANTECUBITAL  Final   Special Requests   Final    BOTTLES DRAWN AEROBIC AND ANAEROBIC Blood Culture adequate volume   Culture   Final    NO GROWTH 3 DAYS Performed at [facility], [address], [place], [place] [address]    Report Status PENDING  Incomplete  Resp Panel by RT-PCR (Flu A&B, Covid) Nasopharyngeal Swab     Status: None   Collection Time: 01/25/21  4:37 AM   Specimen: Nasopharyngeal Swab; Nasopharyngeal(NP) swabs in vial transport medium  Result Value Ref Range Status   SARS Coronavirus 2 by RT PCR NEGATIVE NEGATIVE Final    Comment: (NOTE) SARS-CoV-2 target nucleic acids are NOT DETECTED.  The SARS-CoV-2 RNA is generally detectable in upper respiratory specimens during the acute phase of infection. The lowest concentration of SARS-CoV-2 viral copies this assay can detect is 138 copies/mL. A negative result does not preclude SARS-Cov-2 infection and should not be used as the sole basis for treatment or other patient management decisions. A negative result may occur with  improper specimen collection/handling, submission of specimen other than nasopharyngeal swab, presence of viral mutation(s) within the areas targeted by this assay, and inadequate number of viral copies(<138 copies/mL). A negative result must be combined with clinical observations, patient history, and epidemiological information. The expected result is  Negative.  Fact Sheet for Patients:  EntrepreneurPulse.com.au  Fact Sheet for Healthcare Providers:  IncredibleEmployment.be  This test is no t yet approved or cleared by the [place] FDA and  has been authorized for detection and/or diagnosis of SARS-CoV-2 by FDA under an Emergency Use Authorization (EUA). This EUA will remain  in effect (meaning this test can be used) for the duration of the COVID-19 declaration under Section 564(b)(1) of the Act, 21 U.S.C.section 360bbb-3(b)(1), unless the authorization is terminated  or revoked sooner.       Influenza A by PCR NEGATIVE NEGATIVE Final   Influenza B by PCR NEGATIVE NEGATIVE Final    Comment: (NOTE) The Xpert Xpress SARS-CoV-2/FLU/RSV plus assay is intended as an aid in the diagnosis of influenza from Nasopharyngeal swab specimens and should not be used as a sole basis for treatment. Nasal washings and aspirates are unacceptable for Xpert Xpress SARS-CoV-2/FLU/RSV testing.  Fact Sheet for Patients: EntrepreneurPulse.com.au  Fact Sheet for Healthcare Providers: IncredibleEmployment.be  This test is not yet approved or cleared by the [place] FDA and has been authorized for detection and/or diagnosis of SARS-CoV-2 by FDA under an Emergency Use Authorization (EUA). This EUA will remain in effect (meaning this test can be used) for the duration of the COVID-19 declaration under Section 564(b)(1) of the Act, 21 U.S.C. section 360bbb-3(b)(1), unless the authorization is terminated or revoked.  Performed at [facility], [place] [address], [place], [place] [address]   Blood culture (routine x 2)     Status: None (Preliminary result)   Collection Time: 01/25/21  4:38 AM   Specimen: BLOOD RIGHT HAND  Result Value Ref Range Status   Specimen Description BLOOD RIGHT HAND  Final   Special Requests   Final    BOTTLES DRAWN AEROBIC AND ANAEROBIC  Blood Culture adequate volume   Culture  Final    NO GROWTH 3 DAYS Performed at [facility], [place] [address], [place], [place] [address]    Report Status PENDING  Incomplete  Aerobic/Anaerobic Culture w Gram Stain (surgical/deep wound)     Status: None (Preliminary result)   Collection Time: 01/25/21  8:08 AM   Specimen: PATH Other; Body Fluid  Result Value Ref Range Status   Specimen Description ABSCESS  Final   Special Requests EPIDUAL ABSCESS SPEC A  Final   Gram Stain NO WBC SEEN NO ORGANISMS SEEN   Final   Culture   Final    HOLDING FOR POSSIBLE ANAEROBE Performed at [facility], [address] [place], [place] [address]    Report Status PENDING  Incomplete  Aerobic Culture w Gram Stain (superficial specimen)     Status: None   Collection Time: 01/25/21  8:14 AM   Specimen: Wound  Result Value Ref Range Status   Specimen Description WOUND  Final   Special Requests ABSC  Final   Gram Stain   Final    RARE WBC PRESENT,BOTH PMN AND MONONUCLEAR RARE GRAM POSITIVE COCCI IN PAIRS Performed at [facility], [place] [address], [place], [place] [address]    Culture FEW STAPHYLOCOCCUS EPIDERMIDIS  Final   Report Status 01/27/2021 FINAL  Final   Organism ID, Bacteria STAPHYLOCOCCUS EPIDERMIDIS  Final      Susceptibility   Staphylococcus epidermidis - MIC*    CIPROFLOXACIN <=0.5 SENSITIVE Sensitive     ERYTHROMYCIN >=8 RESISTANT Resistant     GENTAMICIN <=0.5 SENSITIVE Sensitive     OXACILLIN <=0.25 SENSITIVE Sensitive     TETRACYCLINE <=1 SENSITIVE Sensitive     VANCOMYCIN 1 SENSITIVE Sensitive     TRIMETH/SULFA <=10 SENSITIVE Sensitive     CLINDAMYCIN <=0.25 SENSITIVE Sensitive     RIFAMPIN <=0.5 SENSITIVE Sensitive     Inducible Clindamycin NEGATIVE Sensitive     * FEW STAPHYLOCOCCUS EPIDERMIDIS    Procedures and diagnostic studies:  No results found.  Medications:   . acetaminophen  1,000 mg Oral TID  . benztropine  1 mg Oral QHS  .  Chlorhexidine Gluconate Cloth  6 each Topical Daily  . divalproex  500 mg Oral BID  . docusate sodium  100 mg Oral BID  . FLUoxetine  20 mg Oral Daily  . gentamicin cream  1 application Topical Daily  . iron polysaccharides  150 mg Oral QODAY  . levETIRAcetam  500 mg Oral BID  . levothyroxine  50 mcg Oral Q breakfast  . lidocaine  1 patch Transdermal Daily  . losartan  25 mg Oral Daily  . multivitamin  1 tablet Oral Daily  . pantoprazole  40 mg Oral Daily  . risperiDONE  2 mg Oral QHS  . sevelamer carbonate  800 mg Oral BID WC  . sevelamer carbonate  800 mg Oral With snacks  . vitamin B-12  500 mcg Oral Daily   Continuous Infusions: . sodium chloride 65 mL/hr at 01/28/21 0541  .  ceFAZolin (ANCEF) IV 1 g (01/27/21 1643)     LOS: 3 days   Geradine Girt  [facility]   How to contact the [facility] Attending or Consulting provider [place] or covering provider during after hours [place], for this patient?  1. Check the care team in [facility] and look for a) attending/consulting [facility] provider listed and b) the [facility] team listed 2. Log into [email or web address] and use [place]'s universal password  to access. If you do not have the password, please contact the hospital operator. 3. Locate the [facility] provider you are looking for under [facility] and page to a number that you can be directly reached. 4. If you still have difficulty reaching the provider, please page the [facility] (Director on Call) for the Hospitalists listed on amion for assistance.  01/28/2021, 12:03 PM

## 2021-01-28 NOTE — Progress Notes (Signed)
[facility] Progress Note  Subjective: PD ran well, wt's down slightly , labs good and VS good. Pt screaming, per family member she does not do this at home but does so in the hospital usually.   Vitals:   01/27/21 2155 01/28/21 0436 01/28/21 0650 01/28/21 1205  BP: 138/69 (!) 113/59 126/84 122/72  Pulse: (!) 107 (!) 105 (!) 109 (!) 103  Resp: 16 15 16 16   Temp:  98.5 F (36.9 C) 98.7 F (37.1 C) 99 F (37.2 C)  TempSrc:  Oral Oral Oral  SpO2: 97% 94% 96% 96%  Weight:   70.6 kg   Height:        Exam:   Alert, crying out, not in distress   Oriented to hospital , not to date/ year or city  no jvd  Chest cta bilat  Cor reg no RG  Abd soft ntnd no ascites, RLQ PD cath intact   Ext no LE edema   Alert, NF   Home meds:  - sensipar 30am +60 pm/ kdur 20 qam/ renvela 800 ac tid  - risperdal 2 mg hs/ keppra 500 bid/ prozac 20 qd/ depakote dr 500 bid/ cogentin 1 mg hs  - synthroid 50 ug qd/ prilosec 40 qd  - losartan 25 qd  - prn's/ vitamins/ supplements    CXR 4/05 - IMPRESSION: Normal heart size, mediastinal contours, and pulmonary vascularity.     Na 129  K 2.9  CO2 26  bUN 64  Cr 7.01  Alb 1.7  Hb 8.2    OP PD: Dr Nancy Marus in [place] is her nephrologist  4 exchanges over night, 2 L each.  Dry wt = 71.5kg  Assessment/ Plan: 1. Discitis/ osteomyelitis/ epidural abscess - sp L5- S1 laminectomy+  epidural abscess evac on 01/25/21. To get IV Ancef thru 5/17 (6 wks per ID). Culture neg.  2. ESRD - on PD.  Does 2 liter exchanges, 4 every night. Cont PD nightly while here. Add heparin to all fluids due to fibrin issues.  3. HTN/ vol  - cont losartan 25 qd. Cont all 1.5% as she is under dry wt. Cont IVF"s at 65 cc/hr for vol support 4. Schizophrenia/ bipolar - per pmd 5. Seizure d/o - per pmd 6. MBD ckd - corr Ca and phos are in range. Cont renvela, sensipar 7. Anemia ckd / abl post-op: Hb 8.2 > down to 5.8 and now back up to 8.3 today after prbc x 2 yest 4/7.     Jane [name] 01/28/2021, 1:14 PM   Recent Labs  Lab 01/27/21 0323 01/28/21 0417  K 4.4 4.4  BUN 69* 63*  CREATININE 7.25* 7.21*  CALCIUM 7.2* 7.2*  PHOS 4.2 5.0*  HGB 5.8* 8.3*   Inpatient medications: . acetaminophen  1,000 mg Oral TID  . benztropine  1 mg Oral QHS  . Chlorhexidine Gluconate Cloth  6 each Topical Daily  . diclofenac Sodium  2 g Topical QID  . divalproex  500 mg Oral BID  . docusate sodium  100 mg Oral BID  . FLUoxetine  20 mg Oral Daily  . gentamicin cream  1 application Topical Daily  . iron polysaccharides  150 mg Oral QODAY  . levETIRAcetam  500 mg Oral BID  . levothyroxine  50 mcg Oral Q breakfast  . lidocaine  1 patch Transdermal Daily  . losartan  25 mg Oral Daily  . multivitamin  1 tablet Oral Daily  . pantoprazole  40 mg Oral  Daily  . risperiDONE  2 mg Oral QHS  . sevelamer carbonate  800 mg Oral BID WC  . sevelamer carbonate  800 mg Oral With snacks  . vitamin B-12  500 mcg Oral Daily   . sodium chloride 65 mL/hr at 01/28/21 0541  .  ceFAZolin (ANCEF) IV 1 g (01/27/21 1643)   albuterol, bisacodyl, cyclobenzaprine, dianeal solution for CAPD/CCPD with heparin, HYDROmorphone (DILAUDID) injection, LORazepam, menthol-cetylpyridinium **OR** phenol, ondansetron **OR** ondansetron (ZOFRAN) IV, oxyCODONE, polyethylene glycol, sodium chloride flush

## 2021-01-28 NOTE — Progress Notes (Addendum)
   Providing Compassionate, Quality Care - Together   Subjective: Patient resting comfortably.  Objective: Vital signs in last 24 hours: Temp:  [97.7 F (36.5 C)-98.8 F (37.1 C)] 98.7 F (37.1 C) (04/08 0650) Pulse Rate:  [102-114] 109 (04/08 0650) Resp:  [15-18] 16 (04/08 0650) BP: (113-151)/(59-84) 126/84 (04/08 0650) SpO2:  [94 %-99 %] 96 % (04/08 0650) Weight:  [70.3 kg-70.6 kg] 70.6 kg (04/08 0650)  Intake/Output from previous day: 04/07 0701 - 04/08 0700 In: 503.3 [P.O.:120; I.V.:50; Blood:333.3] Out: 30 [Drains:30] Intake/Output this shift: No intake/output data recorded.  Alert PERRLA MAE, Strength appears intact Incision is covered with Honeycomb dressing and Steri Strips; Dressing is clean, dry, and intact. Hemovac dressing replaced per nurse. Drain in place. Minimal output overnight.   Lab Results: Recent Labs    01/27/21 0323 01/28/21 0417  WBC 9.0 9.9  HGB 5.8* 8.3*  HCT 18.5* 25.2*  PLT 359 348   BMET Recent Labs    01/27/21 0323 01/28/21 0417  NA 128* 132*  K 4.4 4.4  CL 91* 93*  CO2 22 22  GLUCOSE 73 71  BUN 69* 63*  CREATININE 7.25* 7.21*  CALCIUM 7.2* 7.2*    Studies/Results: No results found.  Assessment/Plan: Patient underwent L5-S1 decompressive laminectomy with evacuation of epidural abscess on 01/25/2021 by Dr. Annette Stable.   LOS: 3 days    -Continue to mobilize as tolerated. -Pain control. -ABX per ID.Startedon cefazolin 01/27/2021. Will need 6 weeks abx therapy(end date 03/09/2021). -Maintain drain through the weekend. -Recommendation is for SNF at discharge  Viona Gilmore, [place], AGNP-C Nurse Practitioner  [facility] [place] [address], [place], [place] [address] P: [phone]    F: [phone]  01/28/2021, 10:06 AM

## 2021-01-28 NOTE — Progress Notes (Signed)
Patient does want to wear her CPAP tonight. Patient in no distress at this time.

## 2021-01-28 NOTE — Progress Notes (Signed)
[facility] for Infectious Disease  Date of Admission:  01/25/2021     Total days of antibiotics 5         ASSESSMENT:  Ms. Jutras blood cultures have remained without growth to date and it appears safe to place a tunneled cathter when appropriate. She will need 6 weeks of Cefazolin for her diskitis and will plan for treatment through 5/17. Disposition appears to be Skilled Nursing. Will arrange follow up in person or telehealth with ID office.   PLAN:  1. Continue Cefazolin through 5/17. 2. Tunneled cathter when appropriate prior to discharge.  3. OPAT and Home Health Orders placed.  4. Continue post surgical wound care per Neurosurgery. 5. Arrange follow up in ID office through telehealth or in person visit.   Diagnosis:  Diskitis   Culture Result: MSSE  Allergies  Allergen Reactions  . Icodextrin Rash    OPAT Orders Discharge antibiotics to be given via PICC line Discharge antibiotics: Cefazolin Per pharmacy protocol  Aim for Vancomycin trough 15-20 or AUC 400-550 (unless otherwise indicated)  Duration:  6 weeks  End Date:  03/08/21  [facility] Care Per Protocol:  Home health RN for IV administration and teaching; PICC line care and labs.    Labs weekly while on IV antibiotics: _X_ CBC with differential _X_ BMP __ CMP _X_ CRP _X_ ESR __ Vancomycin trough __ CK  __ Please pull PIC at completion of IV antibiotics _X_ Please leave PIC in place until doctor has seen patient or been notified  Fax weekly labs to [phone]  Clinic Follow Up Appt:  4/28 at 9:30am with Dr. Linus Salmons    Principal Problem:   Discitis of lumbosacral region Active Problems:   Schizoaffective disorder ([place])   RETARDATION, MENTAL NOS   Essential hypertension   End-stage renal disease on peritoneal dialysis ([place])   Seizure disorder ([place])   Pressure injury of skin   . acetaminophen  1,000 mg Oral TID  . benztropine  1 mg Oral QHS  . Chlorhexidine Gluconate Cloth   6 each Topical Daily  . divalproex  500 mg Oral BID  . docusate sodium  100 mg Oral BID  . FLUoxetine  20 mg Oral Daily  . gentamicin cream  1 application Topical Daily  . iron polysaccharides  150 mg Oral QODAY  . levETIRAcetam  500 mg Oral BID  . levothyroxine  50 mcg Oral Q breakfast  . lidocaine  1 patch Transdermal Daily  . losartan  25 mg Oral Daily  . multivitamin  1 tablet Oral Daily  . pantoprazole  40 mg Oral Daily  . risperiDONE  2 mg Oral QHS  . sevelamer carbonate  800 mg Oral BID WC  . sevelamer carbonate  800 mg Oral With snacks  . vitamin B-12  500 mcg Oral Daily    SUBJECTIVE:  Afebrile overnight with no acute events.  Allergies  Allergen Reactions  . Icodextrin Rash     Review of Systems: Review of Systems  Unable to perform ROS: Mental acuity    OBJECTIVE: Vitals:   01/27/21 1704 01/27/21 2155 01/28/21 0436 01/28/21 0650  BP: (!) 146/68 138/69 (!) 113/59 126/84  Pulse: (!) 102 (!) 107 (!) 105 (!) 109  Resp: _0 Temp: 97.7 F (36.5 C)  98.5 F (36.9 C) 98.7 F (37.1 C)  TempSrc: Oral  Oral Oral  SpO2: 99% 97% 94% 96%  Weight: 70.3 kg   70.6 kg  Height:  Body mass index is 25.12 kg/m.  Physical Exam Constitutional:      General: She is not in acute distress.    Appearance: She is well-developed.  Cardiovascular:     Rate and Rhythm: Normal rate and regular rhythm.     Heart sounds: Normal heart sounds.  Pulmonary:     Effort: Pulmonary effort is normal.     Breath sounds: Normal breath sounds.  Skin:    General: Skin is warm and dry.  Neurological:     Mental Status: She is alert.     Lab Results Lab Results  Component Value Date   WBC 9.9 01/28/2021   HGB 8.3 (L) 01/28/2021   HCT 25.2 (L) 01/28/2021   MCV 95.1 01/28/2021   PLT 348 01/28/2021    Lab Results  Component Value Date   CREATININE 7.21 (H) 01/28/2021   BUN 63 (H) 01/28/2021   NA 132 (L) 01/28/2021   K 4.4 01/28/2021   CL 93 (L) 01/28/2021    CO2 22 01/28/2021    Lab Results  Component Value Date   ALT 11 01/25/2021   AST 14 (L) 01/25/2021   ALKPHOS 319 (H) 01/25/2021   BILITOT 0.7 01/25/2021     Microbiology: Recent Results (from the past 240 hour(s))  Blood culture (routine x 2)     Status: None (Preliminary result)   Collection Time: 01/25/21  4:37 AM   Specimen: BLOOD  Result Value Ref Range Status   Specimen Description BLOOD RIGHT ANTECUBITAL  Final   Special Requests   Final    BOTTLES DRAWN AEROBIC AND ANAEROBIC Blood Culture adequate volume   Culture   Final    NO GROWTH 3 DAYS Performed at [facility], [address] [place], [place] [address]    Report Status PENDING  Incomplete  Resp Panel by RT-PCR (Flu A&B, Covid) Nasopharyngeal Swab     Status: None   Collection Time: 01/25/21  4:37 AM   Specimen: Nasopharyngeal Swab; Nasopharyngeal(NP) swabs in vial transport medium  Result Value Ref Range Status   SARS Coronavirus 2 by RT PCR NEGATIVE NEGATIVE Final    Comment: (NOTE) SARS-CoV-2 target nucleic acids are NOT DETECTED.  The SARS-CoV-2 RNA is generally detectable in upper respiratory specimens during the acute phase of infection. The lowest concentration of SARS-CoV-2 viral copies this assay can detect is 138 copies/mL. A negative result does not preclude SARS-Cov-2 infection and should not be used as the sole basis for treatment or other patient management decisions. A negative result may occur with  improper specimen collection/handling, submission of specimen other than nasopharyngeal swab, presence of viral mutation(s) within the areas targeted by this assay, and inadequate number of viral copies(<138 copies/mL). A negative result must be combined with clinical observations, patient history, and epidemiological information. The expected result is Negative.  Fact Sheet for Patients:  EntrepreneurPulse.com.au  Fact Sheet for Healthcare Providers:   IncredibleEmployment.be  This test is no t yet approved or cleared by the [place] FDA and  has been authorized for detection and/or diagnosis of SARS-CoV-2 by FDA under an Emergency Use Authorization (EUA). This EUA will remain  in effect (meaning this test can be used) for the duration of the COVID-19 declaration under Section 564(b)(1) of the Act, 21 U.S.C.section 360bbb-3(b)(1), unless the authorization is terminated  or revoked sooner.       Influenza A by PCR NEGATIVE NEGATIVE Final   Influenza B by PCR NEGATIVE NEGATIVE Final  Comment: (NOTE) The Xpert Xpress SARS-CoV-2/FLU/RSV plus assay is intended as an aid in the diagnosis of influenza from Nasopharyngeal swab specimens and should not be used as a sole basis for treatment. Nasal washings and aspirates are unacceptable for Xpert Xpress SARS-CoV-2/FLU/RSV testing.  Fact Sheet for Patients: EntrepreneurPulse.com.au  Fact Sheet for Healthcare Providers: IncredibleEmployment.be  This test is not yet approved or cleared by the [place] FDA and has been authorized for detection and/or diagnosis of SARS-CoV-2 by FDA under an Emergency Use Authorization (EUA). This EUA will remain in effect (meaning this test can be used) for the duration of the COVID-19 declaration under Section 564(b)(1) of the Act, 21 U.S.C. section 360bbb-3(b)(1), unless the authorization is terminated or revoked.  Performed at [facility], [place] [address], [place], [place] [address]   Blood culture (routine x 2)     Status: None (Preliminary result)   Collection Time: 01/25/21  4:38 AM   Specimen: BLOOD RIGHT HAND  Result Value Ref Range Status   Specimen Description BLOOD RIGHT HAND  Final   Special Requests   Final    BOTTLES DRAWN AEROBIC AND ANAEROBIC Blood Culture adequate volume   Culture   Final    NO GROWTH 3 DAYS Performed at [facility], [place] [address] [place], [place] [address]    Report Status PENDING  Incomplete  Aerobic/Anaerobic Culture w Gram Stain (surgical/deep wound)     Status: None (Preliminary result)   Collection Time: 01/25/21  8:08 AM   Specimen: PATH Other; Body Fluid  Result Value Ref Range Status   Specimen Description ABSCESS  Final   Special Requests EPIDUAL ABSCESS SPEC A  Final   Gram Stain NO WBC SEEN NO ORGANISMS SEEN   Final   Culture   Final    HOLDING FOR POSSIBLE ANAEROBE Performed at [facility], [address] [place], [place] [address]    Report Status PENDING  Incomplete  Aerobic Culture w Gram Stain (superficial specimen)     Status: None   Collection Time: 01/25/21  8:14 AM   Specimen: Wound  Result Value Ref Range Status   Specimen Description WOUND  Final   Special Requests ABSC  Final   Gram Stain   Final    RARE WBC PRESENT,BOTH PMN AND MONONUCLEAR RARE GRAM POSITIVE COCCI IN PAIRS Performed at [facility], [place] [address] [place], [place] [address]    Culture FEW STAPHYLOCOCCUS EPIDERMIDIS  Final   Report Status 01/27/2021 FINAL  Final   Organism ID, Bacteria STAPHYLOCOCCUS EPIDERMIDIS  Final      Susceptibility   Staphylococcus epidermidis - MIC*    CIPROFLOXACIN <=0.5 SENSITIVE Sensitive     ERYTHROMYCIN >=8 RESISTANT Resistant     GENTAMICIN <=0.5 SENSITIVE Sensitive     OXACILLIN <=0.25 SENSITIVE Sensitive     TETRACYCLINE <=1 SENSITIVE Sensitive     VANCOMYCIN 1 SENSITIVE Sensitive     TRIMETH/SULFA <=10 SENSITIVE Sensitive     CLINDAMYCIN <=0.25 SENSITIVE Sensitive     RIFAMPIN <=0.5 SENSITIVE Sensitive     Inducible Clindamycin NEGATIVE Sensitive     * FEW STAPHYLOCOCCUS EPIDERMIDIS     Terri Piedra, NP [facility] Infectious [facility]  01/28/2021  12:01 PM

## 2021-01-28 NOTE — Progress Notes (Signed)
Occupational Therapy Treatment Patient Details Name: Jane [name] MRN: [id number] DOB: 21-Nov-1965 Today's Date: 01/28/2021    History of present illness The patient is a 55 y.o. year-old w/ hx of schizophrenia, seizure d/o, ESRD on PD, mental retardation (moderate severity), HTN, HL, hypothyroid, bipolar d/o, anemia presented yest to ED w/ abd pain, back pain and leg pain for 1 month. Pt is on home peritoneal dialysis for the last 6 yrs, [place] W/U showed osteo/ discitis at L5-S1 w/ destructive changes of the anterior superior endplate of S1. Also large lateral epidural abscess causing significant compression of the thecal sac. Pt was taken to surgery for L5-S1 laminectomy and evacuation of epidural abscess.  Postop pt is on 5 N. Pt lives with sister who provides 24/7 care.   OT comments  Pt's sister was present in the room, confirmed PLOF and home set up with her. Educated sister on back precautions and log rolling. Sister reported that Jane [name] is near the same functional level as she was prior to admission, therefore she is comfortable with providing 24/7 care to her at home. Jane [name]'s R shoulder is limited in ~10* active ROM, and ~90* PROM, her R hand, wrist and elbow seem to be [facility] which sister said is different from baseline. Pt continues to required +2 for all functional mobility. Dr. Eliseo Squires notified of this change. Discharge plan requires update to home level care with 24/7 care, sister agrees to this plan.     Follow Up Recommendations  Supervision/Assistance - 24 hour;Home health OT;Other (comment) (Home health aide)    Equipment Recommendations  Other (comment) (RW)       Precautions / Restrictions Precautions Precautions: Back Precaution Booklet Issued: Yes (comment) Precaution Comments: Back handout was put in patients room, recommeneded to review back information with sister/caregiver due to pt's ilmited capacity for health literacy. Reviewed back precuations and  log rolling with sister. Required Braces or Orthoses: Spinal Brace Spinal Brace: Lumbar corset;Applied in sitting position Restrictions Weight Bearing Restrictions: No       Mobility Bed Mobility Overal bed mobility: Needs Assistance Bed Mobility: Rolling;Sidelying to Sit;Sit to Sidelying Rolling: Mod assist Sidelying to sit: Max assist;+2 for safety/equipment   Sit to supine: Max assist;+2 for physical assistance;+2 for safety/equipment           Balance Overall balance assessment: Needs assistance Sitting-balance support: Bilateral upper extremity supported Sitting balance-Leahy Scale: Poor Sitting balance - Comments: pt dependent on posterior support otherwise would fall backwards Postural control: Right lateral lean;Posterior lean                                 ADL either performed or assessed with clinical judgement   ADL Overall ADL's : Needs assistance/impaired;At baseline Eating/Feeding: Maximal assistance;Cueing for safety;Cueing for sequencing;Sitting   Grooming: Maximal assistance;Cueing for sequencing;Sitting   Upper Body Bathing: Maximal assistance;Cueing for safety;Cueing for sequencing;Sitting   Lower Body Bathing: Maximal assistance;+2 for physical assistance;+2 for safety/equipment;Bed level   Upper Body Dressing : Maximal assistance;Cueing for safety;Cueing for sequencing;Sitting                   Functional mobility during ADLs: Total assistance;+2 for physical assistance;+2 for safety/equipment General ADL Comments: Pt with limited R shoulder ROM which has affected her abiliy to feed herself, pt requries elbow to be supported in order to bring hand to mouth.  Cognition Arousal/Alertness: Lethargic Behavior During Therapy: WFL for tasks assessed/performed Overall Cognitive Status: History of cognitive impairments - at baseline                                 General Comments: Pt intermittently  responded to simple comands              General Comments hemovac drain intact, IV placement in neck intact    Pertinent Vitals/ Pain       Pain Assessment: Faces Pain Score: 6  Faces Pain Scale: Hurts even more Pain Location: assume back but will just say "generalized" as she did not report location. Pain Descriptors / Indicators: Crying;Discomfort Pain Intervention(s): Limited activity within patient's tolerance;Monitored during session         Frequency  Min 2X/week        Progress Toward Goals  OT Goals(current goals can now be found in the care plan section)  Progress towards OT goals: Progressing toward goals  Acute Rehab OT Goals Patient Stated Goal: didn't state OT Goal Formulation: Patient unable to participate in goal setting Time For Goal Achievement: 02/09/21 Potential to Achieve Goals: Fair ADL Goals Pt Will Perform Grooming: with min guard assist;sitting Pt Will Perform Lower Body Dressing: with mod assist;sit to/from stand Pt Will Transfer to Toilet: with mod assist;squat pivot transfer;bedside commode Additional ADL Goal #1: Pt will respond to step by step verbal commands to assist care giver in basic ADLs  Plan Discharge plan needs to be updated       AM-PAC OT "6 Clicks" Daily Activity     Outcome Measure   Help from another person eating meals?: A Little Help from another person taking care of personal grooming?: A Lot Help from another person toileting, which includes using toliet, bedpan, or urinal?: Total Help from another person bathing (including washing, rinsing, drying)?: A Lot Help from another person to put on and taking off regular upper body clothing?: A Lot Help from another person to put on and taking off regular lower body clothing?: Total 6 Click Score: 11    End of Session    OT Visit Diagnosis: Unsteadiness on feet (R26.81);Other abnormalities of gait and mobility (R26.89);Repeated falls (R29.6);Muscle weakness  (generalized) (M62.81);Cognitive communication deficit (R41.841);Pain Pain - Right/Left: Right Pain - part of body: Arm (Back)   Activity Tolerance Patient limited by lethargy;Patient limited by pain   Patient Left in bed;with call bell/phone within reach;with bed alarm set;with family/visitor present   Nurse Communication  (Communicated R shouldr involvement wtih Attending)        Time: 8756-4332 OT Time Calculation (min): 31 min  Charges: OT General Charges $OT Visit: 1 Visit OT Treatments $Self Care/Home Management : 8-22 mins $Therapeutic Exercise: 8-22 mins    Empress Newmann A Laci Frenkel 01/28/2021, 1:52 PM

## 2021-01-28 NOTE — Progress Notes (Signed)
Physical Therapy Treatment Patient Details Name: Jane [name] MRN: [id number] DOB: 04/14/1966 Today's Date: 01/28/2021    History of Present Illness The patient is a 55 y.o. year-old w/ hx of schizophrenia, seizure d/o, ESRD on PD, mental retardation (moderate severity), HTN, HL, hypothyroid, bipolar d/o, anemia presented yest to ED w/ abd pain, back pain and leg pain for 1 month. Pt is on home peritoneal dialysis for the last 6 yrs, [place] neph[place] W/U showed osteo/ discitis at L5-S1 w/ destructive changes of the anterior superior endplate of S1. Also large lateral epidural abscess causing significant compression of the thecal sac. Pt was taken to surgery for L5-S1 laminectomy and evacuation of epidural abscess.  Postop pt is on 5 N. Pt lives with sister who provides 24/7 care.    PT Comments    Pt sidelying in bed on arrival.  She continues to moan but easier to redirect.  Pt performed x 3 sit to stands in [place] stedy with active participation.  Her R arm continues to bother her.    Of note: pre entry into room nursing reports fall ( unrelated to PT) where he found her sitting on her bottom in the floor.  RN reports she has been assessed and okay to proceed with PT.      Follow Up Recommendations  SNF;Supervision/Assistance - 24 hour     Equipment Recommendations  Wheelchair cushion (measurements PT);Wheelchair (measurements PT);Rolling walker with 5" wheels;3in1 (PT)    Recommendations for Other Services       Precautions / Restrictions Precautions Precautions: Back;Other (comment) (Pt had fall unrelated to PT, where nursing found her on floor by bed sitting on her bottom.) Precaution Booklet Issued: Yes (comment) Precaution Comments: No caregiver in room to review back precautions. Required Braces or Orthoses: Spinal Brace Spinal Brace: Lumbar corset;Applied in sitting position Restrictions Weight Bearing Restrictions: No Other Position/Activity Restrictions: No  bending, lifting, twisting    Mobility  Bed Mobility Overal bed mobility: Needs Assistance Bed Mobility: Rolling;Sidelying to Sit;Sit to Sidelying Rolling: Mod assist Sidelying to sit: Max assist;+2 for safety/equipment   Sit to supine: Max assist;+2 for physical assistance;+2 for safety/equipment Sit to sidelying: Max assist General bed mobility comments: Pt continues to require mod to max to perform log rolling out of bed and back into bed.    Transfers Overall transfer level: Needs assistance Equipment used: Ambulation equipment used (sara stedy) Transfers: Sit to/from Stand Sit to Stand: Max assist;+2 physical assistance         General transfer comment: Pt able to initiate standing better this session with use of stedy.  Pt reaching with LUE but refused to use RUE.  Pt able to lift hips from edge of bed x 3 with +2 max assistance. She presents with poor posture and decreased hip extension.  Ambulation/Gait Ambulation/Gait assistance:  (NT)               Stairs             Wheelchair Mobility    Modified Rankin (Stroke Patients Only)       Balance Overall balance assessment: Needs assistance Sitting-balance support: Bilateral upper extremity supported Sitting balance-Leahy Scale: Poor Sitting balance - Comments: pushing posterior Postural control: Posterior lean   Standing balance-Leahy Scale: Poor Standing balance comment: pushing posterior                            Cognition Arousal/Alertness: Awake/alert Behavior  During Therapy: WFL for tasks assessed/performed Overall Cognitive Status: History of cognitive impairments - at baseline                                 General Comments: Pt following commands better this session, having same therapist was beneficial.      Exercises      General Comments General comments (skin integrity, edema, etc.): hemovac drain intact, IV placement in neck intact       Pertinent Vitals/Pain Pain Assessment: Faces Pain Score: 6  Faces Pain Scale: Hurts little more Pain Location: back Pain Descriptors / Indicators: Sore Pain Intervention(s): Monitored during session;Repositioned    Home Living                      Prior Function            PT Goals (current goals can now be found in the care plan section) Acute Rehab PT Goals Patient Stated Goal: didn't state Potential to Achieve Goals: Fair Progress towards PT goals: Progressing toward goals    Frequency    Min 5X/week      PT Plan Current plan remains appropriate    Co-evaluation              AM-PAC PT "6 Clicks" Mobility   Outcome Measure  Help needed turning from your back to your side while in a flat bed without using bedrails?: A Lot Help needed moving from lying on your back to sitting on the side of a flat bed without using bedrails?: A Lot Help needed moving to and from a bed to a chair (including a wheelchair)?: A Lot Help needed standing up from a chair using your arms (e.g., wheelchair or bedside chair)?: A Lot Help needed to walk in hospital room?: Total Help needed climbing 3-5 steps with a railing? : Total 6 Click Score: 10    End of Session Equipment Utilized During Treatment: Gait belt Activity Tolerance: Patient limited by pain Patient left: in bed;with call bell/phone within reach;with bed alarm set (4 rails up per NSG patient with recent fall this pm unrelated to PT.) Nurse Communication: Mobility status (IV beeping.) PT Visit Diagnosis: Unsteadiness on feet (R26.81);Pain;Other abnormalities of gait and mobility (R26.89);Muscle weakness (generalized) (M62.81)     Time: 1340-1401 PT Time Calculation (min) (ACUTE ONLY): 21 min  Charges:  $Therapeutic Activity: 8-22 mins                     Erasmo Leventhal , PTA Acute Rehabilitation Services Pager [phone] Office [phone]     Aryonna Gunnerson Eli Hose 01/28/2021, 2:15 PM

## 2021-01-28 NOTE — Progress Notes (Signed)
RT placed patient on CPAP HS. NO O2 bleed in needed. Patient tolerating well at this time.  

## 2021-01-29 ENCOUNTER — Encounter (HOSPITAL_COMMUNITY): Payer: Self-pay | Admitting: Internal Medicine

## 2021-01-29 ENCOUNTER — Inpatient Hospital Stay (HOSPITAL_COMMUNITY): Payer: Medicare Other

## 2021-01-29 ENCOUNTER — Other Ambulatory Visit: Payer: Medicare Other

## 2021-01-29 LAB — CBC
HCT: 26.1 % — ABNORMAL LOW (ref 36.0–46.0)
Hemoglobin: 8.5 g/dL — ABNORMAL LOW (ref 12.0–15.0)
MCH: 30.6 pg (ref 26.0–34.0)
MCHC: 32.6 g/dL (ref 30.0–36.0)
MCV: 93.9 fL (ref 80.0–100.0)
Platelets: 357 10*3/uL (ref 150–400)
RBC: 2.78 MIL/uL — ABNORMAL LOW (ref 3.87–5.11)
RDW: 17.2 % — ABNORMAL HIGH (ref 11.5–15.5)
WBC: 8.8 10*3/uL (ref 4.0–10.5)
nRBC: 0 % (ref 0.0–0.2)

## 2021-01-29 LAB — VALPROIC ACID LEVEL: Valproic Acid Lvl: 15 ug/mL — ABNORMAL LOW (ref 50.0–100.0)

## 2021-01-29 MED ORDER — DELFLEX-LC/1.5% DEXTROSE 344 MOSM/L IP SOLN: INTRAPERITONEAL | Status: DC

## 2021-01-29 MED ORDER — ALTEPLASE 2 MG IJ SOLR
2.0000 mg | Freq: Once | INTRAMUSCULAR | Status: AC
  Administered 2021-01-29: 2 mg
  Filled 2021-01-29: qty 2

## 2021-01-29 MED ORDER — DIVALPROEX SODIUM 500 MG PO DR TAB
750.0000 mg | DELAYED_RELEASE_TABLET | Freq: Two times a day (BID) | ORAL | Status: DC
  Administered 2021-01-29 – 2021-02-04 (×10): 750 mg via ORAL
  Filled 2021-01-29 (×14): qty 1

## 2021-01-29 MED ORDER — HEPARIN 1000 UNIT/ML FOR PERITONEAL DIALYSIS: 500.0000 [IU] | INTRAMUSCULAR | Status: DC | PRN

## 2021-01-29 MED ORDER — DELFLEX-LC/1.5% DEXTROSE 344 MOSM/L IP SOLN
INTRAPERITONEAL | Status: DC
  Administered 2021-01-31: 5000 mL via INTRAPERITONEAL

## 2021-01-29 NOTE — Plan of Care (Signed)
  Problem: Clinical Measurements: Goal: Respiratory complications will improve Outcome: Progressing   Problem: Coping: Goal: Level of anxiety will decrease Outcome: Progressing   Problem: Pain Managment: Goal: General experience of comfort will improve Outcome: Progressing   Problem: Safety: Goal: Ability to remain free from injury will improve Outcome: Progressing   

## 2021-01-29 NOTE — Plan of Care (Signed)
  Problem: Activity: Goal: Risk for activity intolerance will decrease Outcome: Progressing   Problem: Coping: Goal: Level of anxiety will decrease Outcome: Progressing   Problem: Pain Managment: Goal: General experience of comfort will improve Outcome: Progressing   Problem: Safety: Goal: Ability to remain free from injury will improve Outcome: Progressing   Problem: Skin Integrity: Goal: Risk for impaired skin integrity will decrease Outcome: Progressing   

## 2021-01-29 NOTE — Plan of Care (Signed)

## 2021-01-29 NOTE — Progress Notes (Signed)
Subjective: Patient reports intermittently tearful  Objective: Vital signs in last 24 hours: Temp:  [97.6 F (36.4 C)-99.1 F (37.3 C)] 99.1 F (37.3 C) (04/09 0621) Pulse Rate:  [101-116] 107 (04/09 0621) Resp:  [16-18] 18 (04/09 0621) BP: (122-156)/(67-106) 152/81 (04/09 0621) SpO2:  [94 %-99 %] 94 % (04/09 0621)  Intake/Output from previous day: 04/08 0701 - 04/09 0700 In: 0  Out: 25 [Drains:25] Intake/Output this shift: No intake/output data recorded.  Physical Exam: Moving both legs spontaneously.  ON CAPD at present.  Drain in place.  Lab Results: Recent Labs    01/28/21 0417 01/29/21 0403  WBC 9.9 8.8  HGB 8.3* 8.5*  HCT 25.2* 26.1*  PLT 348 357   BMET Recent Labs    01/27/21 0323 01/28/21 0417  NA 128* 132*  K 4.4 4.4  CL 91* 93*  CO2 22 22  GLUCOSE 73 71  BUN 69* 63*  CREATININE 7.25* 7.21*  CALCIUM 7.2* 7.2*    Studies/Results: No results found.  Assessment/Plan: Continue drain and ABX.  Mobilize with PT.  Plan at present is SNF on discharge.    LOS: 4 days    Peggyann Shoals, MD 01/29/2021, 8:57 AM

## 2021-01-29 NOTE — Consult Note (Signed)
   Patient Status: [name] - In-pt  Assessment and Plan: Epidural abscess (s/p  L5-S1 decompressive laminectomy with evacuation of epidural abscess in OR 01/25/2021 by Dr. Annette Stable) with associated osteomyelitis/discitis with tentative plans for 6 weeks of IV antibiotics in need of durable IV access. Plan for image-guided tunneled CVC placement in IR tentatively for Monday 01/31/2021 pending consent from father and IR schedule. Typically this is a no sedation procedure, however given her history (intellectual delay, bipolar disorder, and schizophrenia) will make NPO at midnight for Monday in case sedation required. Afebrile and WBCs WNL. Attempt to call father for consent x3 without answer, will reach him prior to procedure.  ______________________________________________________________________   History of Present Illness: Jane [name] is a 55 y.o. female with a past medical history of hypertension, hyperlipidemia, seizures, GERD, ESRD on PD, hypothyroidism, anemia, intellectual delay, bipolar disorder, and schizophrenia. She has been admitted to [facility] since 01/25/2021 for management of epidural abscess with associated discitis/osteomyelitis. She underwent L5-S1 decompressive laminectomy with evacuation of epidural abscess in OR 01/25/2021 by Dr. Annette Stable. ID was consulted who recommended 6 weeks of IV antibiotics as management.  IR consulted by Dr. Eliseo Squires for possible image-guided tunneled CVC placement. Patient laying in bed resting comfortably. History difficult to obtain secondary to intellectual delay. Brother-in-law at bedside, states patient's father is legal guardian.   Allergies and medications reviewed.   Review of Systems: A 12 point ROS discussed and pertinent positives are indicated in the HPI above.  All other systems are negative.  Review of Systems  Unable to perform ROS: Other (Intellectual delay.)    Vital Signs: BP (!) 152/81 (BP Location: Right Arm)   Pulse (!) 107   Temp 99.1  F (37.3 C) (Oral)   Resp 18   Ht 5\' 6"  (1.676 m)   Wt 155 lb 10.3 oz (70.6 kg)   SpO2 94%   BMI 25.12 kg/m   Physical Exam Vitals and nursing note reviewed.  Constitutional:      General: She is not in acute distress.    Comments: Resting comfortably.  Cardiovascular:     Rate and Rhythm: Normal rate and regular rhythm.     Heart sounds: Normal heart sounds. No murmur heard.   Pulmonary:     Effort: Pulmonary effort is normal. No respiratory distress.     Breath sounds: Normal breath sounds. No wheezing.  Skin:    General: Skin is warm and dry.  Neurological:     Comments: Resting comfortably.      Imaging reviewed.   Labs:  COAGS: No results for input(s): INR, APTT in the last 8760 hours.  BMP: Recent Labs    01/25/21 0135 01/25/21 0830 01/26/21 0334 01/27/21 0323 01/28/21 0417  NA 129* 128* 128* 128* 132*  K 2.9* 3.6 5.5* 4.4 4.4  CL 86*  --  91* 91* 93*  CO2 26  --  22 22 22   GLUCOSE 88  --  80 73 71  BUN 64*  --  69* 69* 63*  CALCIUM 7.5*  --  7.2* 7.2* 7.2*  CREATININE 7.01*  --  7.07* 7.25* 7.21*  GFRNONAA 6*  --  6* 6* 6*       Electronically Signed: Earley Abide, PA-C 01/29/2021, 12:40 PM   I spent a total of 15 minutes in face to face in clinical consultation, greater than 50% of which was counseling/coordinating care for venous access.

## 2021-01-29 NOTE — Progress Notes (Signed)
Bed alarm was noted to be sounding and staff ran to the room to check on the patient.  Patient was found sitting up on the floor.  She denied any pain.  She was lifted back to bed and assessed.  No apparent injury or change in prior assessment noted.  It was noted that the right siderail was down at the time of the fall. Dr Eliseo Squires was notified as well as family.

## 2021-01-29 NOTE — Progress Notes (Signed)
Progress Note    Jane [name]  YJE:[id number] DOB: 16-Jul-1966  DOA: 01/25/2021 PCP: [name], MD    Brief Narrative:     Medical records reviewed and are as summarized below:  Jane [name] is an 55 y.o. female with medical history significant of intellectual disability; seizure d/o; schizophrenia/bipolar; hypothyroidism; HTN; HLD; and ESRD on PD presenting with back pain.  Found to have epidural abscess when taken to the OR by neurosurgery.     Assessment/Plan:   Principal Problem:   Discitis of lumbosacral region Active Problems:   Schizoaffective disorder (HCC)   RETARDATION, MENTAL NOS   Essential hypertension   End-stage renal disease on peritoneal dialysis (HCC)   Seizure disorder (HCC)   Pressure injury of skin    Epidural abscess/Discitis- MSSE -Patient with progressive back pain, decreased rectal tone, increased difficulty ambulating with increasing falls -MRI showed severe discitis-osteomyelitis at L5-S1 with multifocal epidural abscesses -Neurosurgery consulted and took patient to the OR urgently for decompressive laminectomy and abscess evacuation -Will need antibiotics for 4-6 weeks - currently on Rocephin/Vanc. -With vertebral osteo, needs testing for TB;  quantiferon gold negative -ID consult to help manage abx appreciated: Continue Cefazolin through 5/17 -will need IJ PICC Line via IR prior to d/c per nephrology  ESRD on PD -Patient on chronic PD -Nephrology is consulting and will order PD -Continue Sensipar, Renvela  mental health issues -Uncertain baseline level of function -Continue home meds - Cogentin, Prozac, Risperdal -periods of agitation: will ask family to stay and will do low dose ativan IV  Seizure d/o -Continue Depakote, Keppra  Hypothyroidism -Continue Synthroid at current dose as TSH normal  HTN -Continue Cozaar  Anemia of CKD -s/p 2 units   Family Communication/Anticipated D/C date and plan/Code  Status   DVT prophylaxis: Lovenox ordered. Code Status: Full Code.  Disposition Plan: Status is: Inpatient  Remains inpatient appropriate because:Inpatient level of care appropriate due to severity of illness   Dispo: The patient is from: Home              Anticipated d/c is to: tbd              Patient currently is not medically stable to d/c.   Difficult to place patient No     Addendum: spoke with OT: sister says she is comfortable providing the level of care Jane [name] is requiring right now. She explained that Jane [name] is performing similarly to how she was prior to admission, minus the yelling/discomfort. The only change is her RUE function, I tested it today, it seems that she does not have much AROM of her shoulder against gravity. Sister said this is different from baseline, as she was feeding herself prior.  -pain seems located to shoulder on right on exam    Medical Consultants:    ID  NS.     Subjective:   Brother- in law at bedside-- patient c/o right should pain---   Objective:    Vitals:   01/28/21 1338 01/28/21 1345 01/28/21 2129 01/29/21 0621  BP: (!) 156/106 133/75 133/67 (!) 152/81  Pulse: (!) 116 (!) 105 (!) 101 (!) 107  Resp: 18 18 18 18   Temp: 97.6 F (36.4 C)  99 F (37.2 C) 99.1 F (37.3 C)  TempSrc: Oral  Oral Oral  SpO2: 95% 95% 99% 94%  Weight:      Height:        Intake/Output Summary (Last 24 hours) at 01/29/2021 1128 Last data  filed at 01/29/2021 0338 Gross per 24 hour  Intake --  Output 25 ml  Net -25 ml   Filed Weights   01/27/21 0758 01/27/21 1704 01/28/21 0650  Weight: 70.7 kg 70.3 kg 70.6 kg    Exam:  General: Appearance:     Overweight female in no acute distress     Lungs:     respirations unlabored  Heart:    Tachycardic.   MS:   All extremities are intact.   Neurologic:   Awake, easily frustrated      Data Reviewed:   I have personally reviewed following labs and imaging studies:  Labs: Labs show the  following:   Basic Metabolic Panel: Recent Labs  Lab 01/25/21 0135 01/25/21 0830 01/26/21 0334 01/27/21 0323 01/28/21 0417  NA 129* 128* 128* 128* 132*  K 2.9* 3.6 5.5* 4.4 4.4  CL 86*  --  91* 91* 93*  CO2 26  --  22 22 22   GLUCOSE 88  --  80 73 71  BUN 64*  --  69* 69* 63*  CREATININE 7.01*  --  7.07* 7.25* 7.21*  CALCIUM 7.5*  --  7.2* 7.2* 7.2*  PHOS  --   --  3.8 4.2 5.0*   GFR Estimated Creatinine Clearance: 8.4 mL/min (A) (by C-G formula based on SCr of 7.21 mg/dL (H)). Liver Function Tests: Recent Labs  Lab 01/25/21 0135 01/26/21 0334 01/27/21 0323 01/28/21 0417  AST 14*  --   --   --   ALT 11  --   --   --   ALKPHOS 319*  --   --   --   BILITOT 0.7  --   --   --   PROT 7.8  --   --   --   ALBUMIN 1.7* 1.4* 1.4* 1.3*   Recent Labs  Lab 01/25/21 0135  LIPASE 38   No results for input(s): AMMONIA in the last 168 hours. Coagulation profile No results for input(s): INR, PROTIME in the last 168 hours.  CBC: Recent Labs  Lab 01/25/21 0135 01/25/21 0830 01/27/21 0323 01/28/21 0417 01/29/21 0403  WBC 11.3*  --  9.0 9.9 8.8  NEUTROABS 9.7*  --   --   --   --   HGB 8.2* 7.5* 5.8* 8.3* 8.5*  HCT 25.6* 22.0* 18.5* 25.2* 26.1*  MCV 100.0  --  103.4* 95.1 93.9  PLT 440*  --  359 348 357   Cardiac Enzymes: No results for input(s): CKTOTAL, CKMB, CKMBINDEX, TROPONINI in the last 168 hours. BNP (last 3 results) No results for input(s): PROBNP in the last 8760 hours. CBG: No results for input(s): GLUCAP in the last 168 hours. D-Dimer: No results for input(s): DDIMER in the last 72 hours. Hgb A1c: No results for input(s): HGBA1C in the last 72 hours. Lipid Profile: No results for input(s): CHOL, HDL, LDLCALC, TRIG, CHOLHDL, LDLDIRECT in the last 72 hours. Thyroid function studies: No results for input(s): TSH, T4TOTAL, T3FREE, THYROIDAB in the last 72 hours.  Invalid input(s): FREET3 Anemia work up: No results for input(s): VITAMINB12, FOLATE,  FERRITIN, TIBC, IRON, RETICCTPCT in the last 72 hours. Sepsis Labs: Recent Labs  Lab 01/25/21 0135 01/27/21 0323 01/28/21 0417 01/29/21 0403  WBC 11.3* 9.0 9.9 8.8    Microbiology Recent Results (from the past 240 hour(s))  Blood culture (routine x 2)     Status: None (Preliminary result)   Collection Time: 01/25/21  4:37 AM   Specimen: BLOOD  Result Value Ref Range Status   Specimen Description BLOOD RIGHT ANTECUBITAL  Final   Special Requests   Final    BOTTLES DRAWN AEROBIC AND ANAEROBIC Blood Culture adequate volume   Culture   Final    NO GROWTH 3 DAYS Performed at [facility], [address], [place], [place] [address]    Report Status PENDING  Incomplete  Resp Panel by RT-PCR (Flu A&B, Covid) Nasopharyngeal Swab     Status: None   Collection Time: 01/25/21  4:37 AM   Specimen: Nasopharyngeal Swab; Nasopharyngeal(NP) swabs in vial transport medium  Result Value Ref Range Status   SARS Coronavirus 2 by RT PCR NEGATIVE NEGATIVE Final    Comment: (NOTE) SARS-CoV-2 target nucleic acids are NOT DETECTED.  The SARS-CoV-2 RNA is generally detectable in upper respiratory specimens during the acute phase of infection. The lowest concentration of SARS-CoV-2 viral copies this assay can detect is 138 copies/mL. A negative result does not preclude SARS-Cov-2 infection and should not be used as the sole basis for treatment or other patient management decisions. A negative result may occur with  improper specimen collection/handling, submission of specimen other than nasopharyngeal swab, presence of viral mutation(s) within the areas targeted by this assay, and inadequate number of viral copies(<138 copies/mL). A negative result must be combined with clinical observations, patient history, and epidemiological information. The expected result is Negative.  Fact Sheet for Patients:  EntrepreneurPulse.com.au  Fact Sheet for Healthcare Providers:   IncredibleEmployment.be  This test is no t yet approved or cleared by the [place] FDA and  has been authorized for detection and/or diagnosis of SARS-CoV-2 by FDA under an Emergency Use Authorization (EUA). This EUA will remain  in effect (meaning this test can be used) for the duration of the COVID-19 declaration under Section 564(b)(1) of the Act, 21 U.S.C.section 360bbb-3(b)(1), unless the authorization is terminated  or revoked sooner.       Influenza A by PCR NEGATIVE NEGATIVE Final   Influenza B by PCR NEGATIVE NEGATIVE Final    Comment: (NOTE) The Xpert Xpress SARS-CoV-2/FLU/RSV plus assay is intended as an aid in the diagnosis of influenza from Nasopharyngeal swab specimens and should not be used as a sole basis for treatment. Nasal washings and aspirates are unacceptable for Xpert Xpress SARS-CoV-2/FLU/RSV testing.  Fact Sheet for Patients: EntrepreneurPulse.com.au  Fact Sheet for Healthcare Providers: IncredibleEmployment.be  This test is not yet approved or cleared by the [place] FDA and has been authorized for detection and/or diagnosis of SARS-CoV-2 by FDA under an Emergency Use Authorization (EUA). This EUA will remain in effect (meaning this test can be used) for the duration of the COVID-19 declaration under Section 564(b)(1) of the Act, 21 U.S.C. section 360bbb-3(b)(1), unless the authorization is terminated or revoked.  Performed at [facility], [place] [address] [place], [place] [address]   Blood culture (routine x 2)     Status: None (Preliminary result)   Collection Time: 01/25/21  4:38 AM   Specimen: BLOOD RIGHT HAND  Result Value Ref Range Status   Specimen Description BLOOD RIGHT HAND  Final   Special Requests   Final    BOTTLES DRAWN AEROBIC AND ANAEROBIC Blood Culture adequate volume   Culture   Final    NO GROWTH 3 DAYS Performed at [facility], [place] [address] [place], [place] [address]    Report Status PENDING  Incomplete  Aerobic/Anaerobic Culture w Gram Stain (surgical/deep wound)  Status: None (Preliminary result)   Collection Time: 01/25/21  8:08 AM   Specimen: PATH Other; Body Fluid  Result Value Ref Range Status   Specimen Description ABSCESS  Final   Special Requests EPIDUAL ABSCESS SPEC A  Final   Gram Stain NO WBC SEEN NO ORGANISMS SEEN   Final   Culture   Final    RARE STAPHYLOCOCCUS EPIDERMIDIS SUSCEPTIBILITIES TO FOLLOW Performed at [facility], [address] [place], [place] [address]    Report Status PENDING  Incomplete  Aerobic Culture w Gram Stain (superficial specimen)     Status: None   Collection Time: 01/25/21  8:14 AM   Specimen: Wound  Result Value Ref Range Status   Specimen Description WOUND  Final   Special Requests ABSC  Final   Gram Stain   Final    RARE WBC PRESENT,BOTH PMN AND MONONUCLEAR RARE GRAM POSITIVE COCCI IN PAIRS Performed at [facility], [place] [address], [place], [place] [address]    Culture FEW STAPHYLOCOCCUS EPIDERMIDIS  Final   Report Status 01/27/2021 FINAL  Final   Organism ID, Bacteria STAPHYLOCOCCUS EPIDERMIDIS  Final      Susceptibility   Staphylococcus epidermidis - MIC*    CIPROFLOXACIN <=0.5 SENSITIVE Sensitive     ERYTHROMYCIN >=8 RESISTANT Resistant     GENTAMICIN <=0.5 SENSITIVE Sensitive     OXACILLIN <=0.25 SENSITIVE Sensitive     TETRACYCLINE <=1 SENSITIVE Sensitive     VANCOMYCIN 1 SENSITIVE Sensitive     TRIMETH/SULFA <=10 SENSITIVE Sensitive     CLINDAMYCIN <=0.25 SENSITIVE Sensitive     RIFAMPIN <=0.5 SENSITIVE Sensitive     Inducible Clindamycin NEGATIVE Sensitive     * FEW STAPHYLOCOCCUS EPIDERMIDIS    Procedures and diagnostic studies:  No results found.  Medications:   . acetaminophen  1,000 mg Oral TID  . benztropine  1 mg Oral QHS  . Chlorhexidine Gluconate Cloth  6 each Topical Daily  . diclofenac Sodium  2 g Topical QID  .  divalproex  500 mg Oral BID  . docusate sodium  100 mg Oral BID  . FLUoxetine  20 mg Oral Daily  . gentamicin cream  1 application Topical Daily  . dianeal solution for CAPD/CCPD with heparin   Peritoneal Dialysis Q24H  . iron polysaccharides  150 mg Oral QODAY  . levETIRAcetam  500 mg Oral BID  . levothyroxine  50 mcg Oral Q breakfast  . lidocaine  1 patch Transdermal Daily  . losartan  25 mg Oral Daily  . multivitamin  1 tablet Oral Daily  . pantoprazole  40 mg Oral Daily  . risperiDONE  2 mg Oral QHS  . sevelamer carbonate  800 mg Oral BID WC  . sevelamer carbonate  800 mg Oral With snacks  . vitamin B-12  500 mcg Oral Daily   Continuous Infusions: . sodium chloride 65 mL/hr at 01/29/21 0417  .  ceFAZolin (ANCEF) IV 1 g (01/28/21 1700)     LOS: 4 days   Geradine Girt  [facility]   How to contact the [facility] Attending or Consulting provider [place] or covering provider during after hours [place], for this patient?  1. Check the care team in [facility] and look for a) attending/consulting [facility] provider listed and b) the [facility] team listed 2. Log into [email or web address] and use [place]'s universal password to access. If you do not have the password, please contact the hospital operator. 3. Locate the  [place] provider you are looking for under [facility] and page to a number that you can be directly reached. 4. If you still have difficulty reaching the provider, please page the [facility] (Director on Call) for the Hospitalists listed on amion for assistance.  01/29/2021, 11:28 AM

## 2021-01-29 NOTE — Progress Notes (Signed)
[facility] Progress Note  Subjective: PD going well, no issues. 0.5 L UF last night.   Vitals:   01/28/21 1338 01/28/21 1345 01/28/21 2129 01/29/21 0621  BP: (!) 156/106 133/75 133/67 (!) 152/81  Pulse: (!) 116 (!) 105 (!) 101 (!) 107  Resp: 18 18 18 18   Temp: 97.6 F (36.4 C)  99 F (37.2 C) 99.1 F (37.3 C)  TempSrc: Oral  Oral Oral  SpO2: 95% 95% 99% 94%  Weight:      Height:        Exam:   Stable, sleeping  no jvd  Chest cta bilat  Cor reg no RG  Abd soft ntnd no ascites, RLQ PD cath intact   Ext no LE edema   Alert, NF   Home meds:  - sensipar 30am +60 pm/ kdur 20 qam/ renvela 800 ac tid  - risperdal 2 mg hs/ keppra 500 bid/ prozac 20 qd/ depakote dr 500 bid/ cogentin 1 mg hs  - synthroid 50 ug qd/ prilosec 40 qd  - losartan 25 qd  - prn's/ vitamins/ supplements    CXR 4/05 - IMPRESSION: Normal heart size, mediastinal contours, and pulmonary vascularity.  BUN 64  Cr 7.01  Alb 1.7  Hb 8.2    OP PD: Dr Nancy Marus in [place] is her nephrologist  4 exchanges over night, 2 L each.  Dry wt = 71.5kg  Assessment/ Plan: 1. Discitis/ osteomyelitis/ epidural abscess - sp L5- S1 laminectomy+  epidural abscess evac on 01/25/21. To get IV Ancef thru 5/17 (6 wks per ID) via tunneled PICC per IR.  Culture neg.  2. ESRD - on PD.  Does 2 liter exchanges, 4 every night. Cont PD nightly while here. Add heparin to all fluids due to fibrin issues.  3. HTN/ vol  - cont losartan 25 qd. Cont all 1.5% as she is under dry wt. Cont IVF"s at 65 cc/hr for vol support. Wts stable at 71kg.  4. Schizophrenia/ bipolar - per pmd 5. Seizure d/o - per pmd 6. MBD ckd - corr Ca and phos are in range. Cont renvela, sensipar 7. Anemia ckd / abl post-op: Hb 8.2 > down to 5.8 > and up to 8.3 after prbc x 2 on 4/7.    Jane [name] 01/29/2021, 1:37 PM   Recent Labs  Lab 01/27/21 0323 01/28/21 0417 01/29/21 0403  K 4.4 4.4  --   BUN 69* 63*  --   CREATININE 7.25* 7.21*  --    CALCIUM 7.2* 7.2*  --   PHOS 4.2 5.0*  --   HGB 5.8* 8.3* 8.5*   Inpatient medications: . acetaminophen  1,000 mg Oral TID  . benztropine  1 mg Oral QHS  . Chlorhexidine Gluconate Cloth  6 each Topical Daily  . diclofenac Sodium  2 g Topical QID  . divalproex  750 mg Oral BID  . docusate sodium  100 mg Oral BID  . FLUoxetine  20 mg Oral Daily  . gentamicin cream  1 application Topical Daily  . dianeal solution for CAPD/CCPD with heparin   Peritoneal Dialysis Q24H  . iron polysaccharides  150 mg Oral QODAY  . levETIRAcetam  500 mg Oral BID  . levothyroxine  50 mcg Oral Q breakfast  . lidocaine  1 patch Transdermal Daily  . losartan  25 mg Oral Daily  . multivitamin  1 tablet Oral Daily  . pantoprazole  40 mg Oral Daily  . risperiDONE  2 mg Oral QHS  .  sevelamer carbonate  800 mg Oral BID WC  . sevelamer carbonate  800 mg Oral With snacks  . vitamin B-12  500 mcg Oral Daily   . sodium chloride 65 mL/hr at 01/29/21 0417  .  ceFAZolin (ANCEF) IV 1 g (01/28/21 1700)   albuterol, bisacodyl, cyclobenzaprine, HYDROmorphone (DILAUDID) injection, LORazepam, menthol-cetylpyridinium **OR** phenol, ondansetron **OR** ondansetron (ZOFRAN) IV, oxyCODONE, polyethylene glycol, sodium chloride flush

## 2021-01-30 LAB — BLOOD CULTURE ID PANEL (REFLEXED) - BCID2

## 2021-01-30 LAB — CBC
HCT: 27 % — ABNORMAL LOW (ref 36.0–46.0)
Hemoglobin: 8.6 g/dL — ABNORMAL LOW (ref 12.0–15.0)
MCH: 30.3 pg (ref 26.0–34.0)
MCHC: 31.9 g/dL (ref 30.0–36.0)
MCV: 95.1 fL (ref 80.0–100.0)
Platelets: 345 10*3/uL (ref 150–400)
RBC: 2.84 MIL/uL — ABNORMAL LOW (ref 3.87–5.11)
RDW: 17 % — ABNORMAL HIGH (ref 11.5–15.5)
WBC: 9.3 10*3/uL (ref 4.0–10.5)
nRBC: 0 % (ref 0.0–0.2)

## 2021-01-30 LAB — AEROBIC/ANAEROBIC CULTURE W GRAM STAIN (SURGICAL/DEEP WOUND): Gram Stain: NONE SEEN

## 2021-01-30 LAB — CULTURE, BLOOD (ROUTINE X 2)
Culture: NO GROWTH
Special Requests: ADEQUATE

## 2021-01-30 MED ORDER — NEPRO/CARBSTEADY PO LIQD
237.0000 mL | Freq: Three times a day (TID) | ORAL | Status: DC
  Administered 2021-01-30 – 2021-02-02 (×2): 237 mL via ORAL

## 2021-01-30 MED ORDER — CINACALCET HCL 30 MG PO TABS
30.0000 mg | ORAL_TABLET | Freq: Every day | ORAL | Status: DC
  Administered 2021-01-30 – 2021-02-04 (×5): 30 mg via ORAL
  Filled 2021-01-30 (×6): qty 1

## 2021-01-30 MED ORDER — DARBEPOETIN ALFA 25 MCG/0.42ML IJ SOSY: 25.0000 ug | PREFILLED_SYRINGE | INTRAMUSCULAR | Status: DC

## 2021-01-30 NOTE — Progress Notes (Signed)
Subjective: NAEs o/n  Objective: Vital signs in last 24 hours: Temp:  [97.5 F (36.4 C)-98.7 F (37.1 C)] 98.4 F (36.9 C) (04/10 0939) Pulse Rate:  [98-108] 98 (04/10 1030) Resp:  [18-32] 21 (04/10 1030) BP: (142-164)/(72-82) 164/76 (04/10 0939) SpO2:  [96 %-99 %] 98 % (04/10 0939) Weight:  [70.6 kg] 70.6 kg (04/10 0655)  Intake/Output from previous day: 04/09 0701 - 04/10 0700 In: 11332.5 [I.V.:3175.5; IV Piggyback:150] Out: 8701 [Drains:50] Intake/Output this shift: No intake/output data recorded.  Awake, alert Dressing c/d Drain output 50 ml Pain with moving legs but has full neurologic strength bilaterally  Lab Results: Recent Labs    01/29/21 0403 01/30/21 0338  WBC 8.8 9.3  HGB 8.5* 8.6*  HCT 26.1* 27.0*  PLT 357 345   BMET Recent Labs    01/28/21 0417  NA 132*  K 4.4  CL 93*  CO2 22  GLUCOSE 71  BUN 63*  CREATININE 7.21*  CALCIUM 7.2*    Studies/Results: DG Shoulder Right Port  Result Date: 01/29/2021 CLINICAL DATA:  Diminished range of motion involving the RIGHT shoulder. EXAM: PORTABLE RIGHT SHOULDER 3 VIEWS COMPARISON:  Visualized RIGHT shoulder on multiple prior chest x-rays. No prior RIGHT shoulder imaging. FINDINGS: Osseous demineralization. No evidence of acute fracture or glenohumeral dislocation. Osteolysis involving the distal clavicle. Acromioclavicular joint anatomically aligned. Widening of the subacromial space, likely indicating a joint effusion. IMPRESSION: 1. No acute osseous abnormality. 2. Suspected joint effusion. 3. Chronic osteolysis of the distal clavicle. Electronically Signed   By: Evangeline Dakin M.D.   On: 01/29/2021 13:20    Assessment/Plan: 55 yo F s/p lami for EDA - cont atbx - drain to be removed tomorrow - likely SNF  Jane [name] 01/30/2021, 11:35 AM

## 2021-01-30 NOTE — Plan of Care (Signed)
  Problem: Activity: Goal: Risk for activity intolerance will decrease Outcome: Progressing   Problem: Pain Managment: Goal: General experience of comfort will improve Outcome: Progressing   

## 2021-01-30 NOTE — Progress Notes (Signed)
Progress Note    Jane [name]  GGE:[id number] DOB: 05-Dec-1965  DOA: 01/25/2021 PCP: [name], MD    Brief Narrative:     Medical records reviewed and are as summarized below:  Jane [name] is an 55 y.o. female with medical history significant of intellectual disability; seizure d/o; schizophrenia/bipolar; hypothyroidism; HTN; HLD; and ESRD on PD presenting with back pain.  Found to have epidural abscess when taken to the OR by neurosurgery.  Right should effusion and may need tap from ortho.   Assessment/Plan:   Principal Problem:   Discitis of lumbosacral region Active Problems:   Schizoaffective disorder (HCC)   RETARDATION, MENTAL NOS   Essential hypertension   End-stage renal disease on peritoneal dialysis (HCC)   Seizure disorder (HCC)   Pressure injury of skin    Epidural abscess/Discitis- MSSE -Patient with progressive back pain, decreased rectal tone, increased difficulty ambulating with increasing falls -MRI showed severe discitis-osteomyelitis at L5-S1 with multifocal epidural abscesses -Neurosurgery consulted and took patient to the OR urgently for decompressive laminectomy and abscess evacuation -quantiferon gold negative -ID consult to help manage abx appreciated: Continue Cefazolin through 5/17 -will need IJ PICC Line via IR prior to d/c per nephrology -blood culture + after 5 days-- will get repeat BC prior to tunneled catheter for IV Abx  ? Right shoulder effusion -will get ortho consult for possible tap  ESRD on PD -Patient on chronic PD -Nephrology is consulting and will order PD -Continue Sensipar, Renvela  mental health issues -Uncertain baseline level of function -Continue home meds - Cogentin, Prozac, Risperdal -periods of agitation: will ask family to stay and will do low dose ativan IV  Seizure d/o -Continue Depakote (dose adjusted), Keppra  Hypothyroidism -Continue Synthroid at current dose as TSH  normal  HTN -Continue Cozaar  Anemia of CKD -s/p 2 units   Family Communication/Anticipated D/C date and plan/Code Status   DVT prophylaxis: Lovenox ordered. Code Status: Full Code.  Disposition Plan: Status is: Inpatient Brother in law at bedside Remains inpatient appropriate because:Inpatient level of care appropriate due to severity of illness   Dispo: The patient is from: Home              Anticipated d/c is to: tbd              Patient currently is not medically stable to d/c.   Difficult to place patient No         Medical Consultants:    ID  NS.     Subjective:   Sleeping on Bipap  Objective:    Vitals:   01/29/21 2149 01/30/21 0655 01/30/21 0700 01/30/21 0939  BP: (!) 151/72 (!) 154/76 (!) 164/82 (!) 164/76  Pulse: (!) 105 (!) 103 (!) 101 (!) 102  Resp: 20 18 18  (!) 32  Temp: 97.8 F (36.6 C) 98.7 F (37.1 C) 97.6 F (36.4 C) 98.4 F (36.9 C)  TempSrc: Oral Oral Oral Oral  SpO2: 99% 98% 97% 98%  Weight:  70.6 kg    Height:        Intake/Output Summary (Last 24 hours) at 01/30/2021 1052 Last data filed at 01/30/2021 0543 Gross per 24 hour  Intake 3325.45 ml  Output 50 ml  Net 3275.45 ml   Filed Weights   01/27/21 1704 01/28/21 0650 01/30/21 0655  Weight: 70.3 kg 70.6 kg 70.6 kg    Exam: Sleeping on bipap    Data Reviewed:   I have personally reviewed following  labs and imaging studies:  Labs: Labs show the following:   Basic Metabolic Panel: Recent Labs  Lab 01/25/21 0135 01/25/21 0830 01/26/21 0334 01/27/21 0323 01/28/21 0417  NA 129* 128* 128* 128* 132*  K 2.9* 3.6 5.5* 4.4 4.4  CL 86*  --  91* 91* 93*  CO2 26  --  22 22 22   GLUCOSE 88  --  80 73 71  BUN 64*  --  69* 69* 63*  CREATININE 7.01*  --  7.07* 7.25* 7.21*  CALCIUM 7.5*  --  7.2* 7.2* 7.2*  PHOS  --   --  3.8 4.2 5.0*   GFR Estimated Creatinine Clearance: 8.4 mL/min (A) (by C-G formula based on SCr of 7.21 mg/dL (H)). Liver Function  Tests: Recent Labs  Lab 01/25/21 0135 01/26/21 0334 01/27/21 0323 01/28/21 0417  AST 14*  --   --   --   ALT 11  --   --   --   ALKPHOS 319*  --   --   --   BILITOT 0.7  --   --   --   PROT 7.8  --   --   --   ALBUMIN 1.7* 1.4* 1.4* 1.3*   Recent Labs  Lab 01/25/21 0135  LIPASE 38   No results for input(s): AMMONIA in the last 168 hours. Coagulation profile No results for input(s): INR, PROTIME in the last 168 hours.  CBC: Recent Labs  Lab 01/25/21 0135 01/25/21 0830 01/27/21 0323 01/28/21 0417 01/29/21 0403 01/30/21 0338  WBC 11.3*  --  9.0 9.9 8.8 9.3  NEUTROABS 9.7*  --   --   --   --   --   HGB 8.2* 7.5* 5.8* 8.3* 8.5* 8.6*  HCT 25.6* 22.0* 18.5* 25.2* 26.1* 27.0*  MCV 100.0  --  103.4* 95.1 93.9 95.1  PLT 440*  --  359 348 357 345   Cardiac Enzymes: No results for input(s): CKTOTAL, CKMB, CKMBINDEX, TROPONINI in the last 168 hours. BNP (last 3 results) No results for input(s): PROBNP in the last 8760 hours. CBG: No results for input(s): GLUCAP in the last 168 hours. D-Dimer: No results for input(s): DDIMER in the last 72 hours. Hgb A1c: No results for input(s): HGBA1C in the last 72 hours. Lipid Profile: No results for input(s): CHOL, HDL, LDLCALC, TRIG, CHOLHDL, LDLDIRECT in the last 72 hours. Thyroid function studies: No results for input(s): TSH, T4TOTAL, T3FREE, THYROIDAB in the last 72 hours.  Invalid input(s): FREET3 Anemia work up: No results for input(s): VITAMINB12, FOLATE, FERRITIN, TIBC, IRON, RETICCTPCT in the last 72 hours. Sepsis Labs: Recent Labs  Lab 01/27/21 0323 01/28/21 0417 01/29/21 0403 01/30/21 0338  WBC 9.0 9.9 8.8 9.3    Microbiology Recent Results (from the past 240 hour(s))  Blood culture (routine x 2)     Status: None (Preliminary result)   Collection Time: 01/25/21  4:37 AM   Specimen: BLOOD  Result Value Ref Range Status   Specimen Description BLOOD RIGHT ANTECUBITAL  Final   Special Requests   Final     BOTTLES DRAWN AEROBIC AND ANAEROBIC Blood Culture adequate volume   Culture   Final    NO GROWTH 4 DAYS Performed at [facility], [address] [place], [place] [address]    Report Status PENDING  Incomplete  Resp Panel by RT-PCR (Flu A&B, Covid) Nasopharyngeal Swab     Status: None   Collection Time: 01/25/21  4:37 AM   Specimen:  Nasopharyngeal Swab; Nasopharyngeal(NP) swabs in vial transport medium  Result Value Ref Range Status   SARS Coronavirus 2 by RT PCR NEGATIVE NEGATIVE Final    Comment: (NOTE) SARS-CoV-2 target nucleic acids are NOT DETECTED.  The SARS-CoV-2 RNA is generally detectable in upper respiratory specimens during the acute phase of infection. The lowest concentration of SARS-CoV-2 viral copies this assay can detect is 138 copies/mL. A negative result does not preclude SARS-Cov-2 infection and should not be used as the sole basis for treatment or other patient management decisions. A negative result may occur with  improper specimen collection/handling, submission of specimen other than nasopharyngeal swab, presence of viral mutation(s) within the areas targeted by this assay, and inadequate number of viral copies(<138 copies/mL). A negative result must be combined with clinical observations, patient history, and epidemiological information. The expected result is Negative.  Fact Sheet for Patients:  EntrepreneurPulse.com.au  Fact Sheet for Healthcare Providers:  IncredibleEmployment.be  This test is no t yet approved or cleared by the [place] FDA and  has been authorized for detection and/or diagnosis of SARS-CoV-2 by FDA under an Emergency Use Authorization (EUA). This EUA will remain  in effect (meaning this test can be used) for the duration of the COVID-19 declaration under Section 564(b)(1) of the Act, 21 U.S.C.section 360bbb-3(b)(1), unless the authorization is terminated  or revoked sooner.        Influenza A by PCR NEGATIVE NEGATIVE Final   Influenza B by PCR NEGATIVE NEGATIVE Final    Comment: (NOTE) The Xpert Xpress SARS-CoV-2/FLU/RSV plus assay is intended as an aid in the diagnosis of influenza from Nasopharyngeal swab specimens and should not be used as a sole basis for treatment. Nasal washings and aspirates are unacceptable for Xpert Xpress SARS-CoV-2/FLU/RSV testing.  Fact Sheet for Patients: EntrepreneurPulse.com.au  Fact Sheet for Healthcare Providers: IncredibleEmployment.be  This test is not yet approved or cleared by the [place] FDA and has been authorized for detection and/or diagnosis of SARS-CoV-2 by FDA under an Emergency Use Authorization (EUA). This EUA will remain in effect (meaning this test can be used) for the duration of the COVID-19 declaration under Section 564(b)(1) of the Act, 21 U.S.C. section 360bbb-3(b)(1), unless the authorization is terminated or revoked.  Performed at [facility], [place] [address] [place], [place] [address]   Blood culture (routine x 2)     Status: None (Preliminary result)   Collection Time: 01/25/21  4:38 AM   Specimen: BLOOD RIGHT HAND  Result Value Ref Range Status   Specimen Description BLOOD RIGHT HAND  Final   Special Requests   Final    BOTTLES DRAWN AEROBIC AND ANAEROBIC Blood Culture adequate volume   Culture  Setup Time   Final    GRAM POSITIVE COCCI AEROBIC BOTTLE ONLY Organism ID to follow CRITICAL RESULT CALLED TO, READ BACK BY AND VERIFIED WITH: PAM ROGERS, RN 01/30/2021 @0132  A.HUGHES Performed at [facility], [place] [address] [place], [place] [address]    Culture GRAM POSITIVE COCCI  Final   Report Status PENDING  Incomplete  Blood Culture ID Panel (Reflexed)     Status: None   Collection Time: 01/25/21  4:38 AM  Result Value Ref Range Status   Enterococcus faecalis NOT DETECTED NOT DETECTED Corrected   Enterococcus Faecium NOT DETECTED NOT  DETECTED Corrected   Listeria monocytogenes NOT DETECTED NOT DETECTED Corrected   Staphylococcus species NOT DETECTED NOT DETECTED Corrected   Staphylococcus aureus (BCID) NOT DETECTED NOT DETECTED  Corrected   Staphylococcus epidermidis NOT DETECTED NOT DETECTED Corrected   Staphylococcus lugdunensis NOT DETECTED NOT DETECTED Corrected   Streptococcus species NOT DETECTED NOT DETECTED Corrected   Streptococcus agalactiae NOT DETECTED NOT DETECTED Corrected   Streptococcus pneumoniae NOT DETECTED NOT DETECTED Corrected   Streptococcus pyogenes NOT DETECTED NOT DETECTED Corrected   A.calcoaceticus-baumannii NOT DETECTED NOT DETECTED Corrected   Bacteroides fragilis NOT DETECTED NOT DETECTED Corrected   Enterobacterales NOT DETECTED NOT DETECTED Corrected   Enterobacter cloacae complex NOT DETECTED NOT DETECTED Corrected   Escherichia coli NOT DETECTED NOT DETECTED Corrected   Klebsiella aerogenes NOT DETECTED NOT DETECTED Corrected   Klebsiella oxytoca NOT DETECTED NOT DETECTED Corrected   Klebsiella pneumoniae NOT DETECTED NOT DETECTED Corrected   Proteus species NOT DETECTED NOT DETECTED Corrected   Salmonella species NOT DETECTED NOT DETECTED Corrected   Serratia marcescens NOT DETECTED NOT DETECTED Corrected   Haemophilus influenzae NOT DETECTED NOT DETECTED Corrected   Neisseria meningitidis NOT DETECTED NOT DETECTED Corrected   Pseudomonas aeruginosa NOT DETECTED NOT DETECTED Corrected   Stenotrophomonas maltophilia NOT DETECTED NOT DETECTED Corrected   Candida albicans NOT DETECTED NOT DETECTED Corrected   Candida auris NOT DETECTED NOT DETECTED Corrected   Candida glabrata NOT DETECTED NOT DETECTED Corrected   Candida krusei NOT DETECTED NOT DETECTED Corrected   Candida parapsilosis NOT DETECTED NOT DETECTED Corrected   Candida tropicalis NOT DETECTED NOT DETECTED Corrected   Cryptococcus neoformans/gattii NOT DETECTED NOT DETECTED Corrected    Comment: Performed at [facility], [place] [address] [place], [place] [address]  Aerobic/Anaerobic Culture w Gram Stain (surgical/deep wound)     Status: None (Preliminary result)   Collection Time: 01/25/21  8:08 AM   Specimen: PATH Other; Body Fluid  Result Value Ref Range Status   Specimen Description ABSCESS  Final   Special Requests EPIDUAL ABSCESS SPEC A  Final   Gram Stain   Final    NO WBC SEEN NO ORGANISMS SEEN Performed at [facility], [address] [place], [place] [address]    Culture RARE STAPHYLOCOCCUS EPIDERMIDIS  Final   Report Status PENDING  Incomplete   Organism ID, Bacteria STAPHYLOCOCCUS EPIDERMIDIS  Final      Susceptibility   Staphylococcus epidermidis - MIC*    CIPROFLOXACIN <=0.5 SENSITIVE Sensitive     ERYTHROMYCIN >=8 RESISTANT Resistant     GENTAMICIN <=0.5 SENSITIVE Sensitive     OXACILLIN <=0.25 SENSITIVE Sensitive     TETRACYCLINE <=1 SENSITIVE Sensitive     VANCOMYCIN 1 SENSITIVE Sensitive     TRIMETH/SULFA <=10 SENSITIVE Sensitive     CLINDAMYCIN <=0.25 SENSITIVE Sensitive     RIFAMPIN <=0.5 SENSITIVE Sensitive     Inducible Clindamycin NEGATIVE Sensitive     * RARE STAPHYLOCOCCUS EPIDERMIDIS  Aerobic Culture w Gram Stain (superficial specimen)     Status: None   Collection Time: 01/25/21  8:14 AM   Specimen: Wound  Result Value Ref Range Status   Specimen Description WOUND  Final   Special Requests ABSC  Final   Gram Stain   Final    RARE WBC PRESENT,BOTH PMN AND MONONUCLEAR RARE GRAM POSITIVE COCCI IN PAIRS Performed at [facility], [address], [place], [place] [address]    Culture FEW STAPHYLOCOCCUS EPIDERMIDIS  Final   Report Status 01/27/2021 FINAL  Final   Organism ID, Bacteria STAPHYLOCOCCUS EPIDERMIDIS  Final      Susceptibility   Staphylococcus epidermidis - MIC*    CIPROFLOXACIN <=  0.5 SENSITIVE Sensitive     ERYTHROMYCIN >=8 RESISTANT Resistant     GENTAMICIN <=0.5 SENSITIVE Sensitive     OXACILLIN <=0.25 SENSITIVE Sensitive      TETRACYCLINE <=1 SENSITIVE Sensitive     VANCOMYCIN 1 SENSITIVE Sensitive     TRIMETH/SULFA <=10 SENSITIVE Sensitive     CLINDAMYCIN <=0.25 SENSITIVE Sensitive     RIFAMPIN <=0.5 SENSITIVE Sensitive     Inducible Clindamycin NEGATIVE Sensitive     * FEW STAPHYLOCOCCUS EPIDERMIDIS    Procedures and diagnostic studies:  DG Shoulder Right Port  Result Date: 01/29/2021 CLINICAL DATA:  Diminished range of motion involving the RIGHT shoulder. EXAM: PORTABLE RIGHT SHOULDER 3 VIEWS COMPARISON:  Visualized RIGHT shoulder on multiple prior chest x-rays. No prior RIGHT shoulder imaging. FINDINGS: Osseous demineralization. No evidence of acute fracture or glenohumeral dislocation. Osteolysis involving the distal clavicle. Acromioclavicular joint anatomically aligned. Widening of the subacromial space, likely indicating a joint effusion. IMPRESSION: 1. No acute osseous abnormality. 2. Suspected joint effusion. 3. Chronic osteolysis of the distal clavicle. Electronically Signed   By: Evangeline Dakin M.D.   On: 01/29/2021 13:20    Medications:   . acetaminophen  1,000 mg Oral TID  . benztropine  1 mg Oral QHS  . Chlorhexidine Gluconate Cloth  6 each Topical Daily  . diclofenac Sodium  2 g Topical QID  . divalproex  750 mg Oral BID  . docusate sodium  100 mg Oral BID  . FLUoxetine  20 mg Oral Daily  . gentamicin cream  1 application Topical Daily  . dianeal solution for CAPD/CCPD with heparin   Peritoneal Dialysis Q24H  . iron polysaccharides  150 mg Oral QODAY  . levETIRAcetam  500 mg Oral BID  . levothyroxine  50 mcg Oral Q breakfast  . lidocaine  1 patch Transdermal Daily  . losartan  25 mg Oral Daily  . multivitamin  1 tablet Oral Daily  . pantoprazole  40 mg Oral Daily  . risperiDONE  2 mg Oral QHS  . sevelamer carbonate  800 mg Oral BID WC  . sevelamer carbonate  800 mg Oral With snacks  . vitamin B-12  500 mcg Oral Daily   Continuous Infusions: . sodium chloride 65 mL/hr at 01/29/21  2012  .  ceFAZolin (ANCEF) IV Stopped (01/29/21 1734)  . dialysis solution 1.5% low-MG/low-CA       LOS: 5 days   Geradine Girt  [facility]   How to contact the [facility] Attending or Consulting provider [place] or covering provider during after hours [place], for this patient?  1. Check the care team in [facility] and look for a) attending/consulting [facility] provider listed and b) the [facility] team listed 2. Log into [email or web address] and use [place]'s universal password to access. If you do not have the password, please contact the hospital operator. 3. Locate the [facility] provider you are looking for under [facility] and page to a number that you can be directly reached. 4. If you still have difficulty reaching the provider, please page the [facility] (Director on Call) for the Hospitalists listed on amion for assistance.  01/30/2021, 10:52 AM

## 2021-01-30 NOTE — Progress Notes (Signed)
Notified X. Blount that Lab called and stated pt gram stain showed  moderate gram + cocci. The BCID did not detect any organism. RN will continue to monitor pt.

## 2021-01-30 NOTE — Progress Notes (Signed)
[facility] Progress Note   Subjective:   Patient seen and examined at bedside.  Yelling out and moaning.  Reports "my butt hurts".  No issues with PD overnight.   Objective Vitals:   01/29/21 2149 01/30/21 0655 01/30/21 0700 01/30/21 0939  BP: (!) 151/72 (!) 154/76 (!) 164/82 (!) 164/76  Pulse: (!) 105 (!) 103 (!) 101 (!) 102  Resp: 20 18 18  (!) 32  Temp: 97.8 F (36.6 C) 98.7 F (37.1 C) 97.6 F (36.4 C) 98.4 F (36.9 C)  TempSrc: Oral Oral Oral Oral  SpO2: 99% 98% 97% 98%  Weight:  70.6 kg    Height:       Physical Exam General:chronically ill appearing female in NAD Heart:RRR, no mrg Lungs:CTAB Abdomen:soft, NTND, +PD cath Extremities:no LE edema Dialysis Access: PD cath   [facility] Weights   01/27/21 1704 01/28/21 0650 01/30/21 0655  Weight: 70.3 kg 70.6 kg 70.6 kg    Intake/Output Summary (Last 24 hours) at 01/30/2021 1044 Last data filed at 01/30/2021 0543 Gross per 24 hour  Intake 3325.45 ml  Output 50 ml  Net 3275.45 ml    Additional Objective Labs: Basic Metabolic Panel: Recent Labs  Lab 01/26/21 0334 01/27/21 0323 01/28/21 0417  NA 128* 128* 132*  K 5.5* 4.4 4.4  CL 91* 91* 93*  CO2 22 22 22   GLUCOSE 80 73 71  BUN 69* 69* 63*  CREATININE 7.07* 7.25* 7.21*  CALCIUM 7.2* 7.2* 7.2*  PHOS 3.8 4.2 5.0*   Liver Function Tests: Recent Labs  Lab 01/25/21 0135 01/26/21 0334 01/27/21 0323 01/28/21 0417  AST 14*  --   --   --   ALT 11  --   --   --   ALKPHOS 319*  --   --   --   BILITOT 0.7  --   --   --   PROT 7.8  --   --   --   ALBUMIN 1.7* 1.4* 1.4* 1.3*   Recent Labs  Lab 01/25/21 0135  LIPASE 38   CBC: Recent Labs  Lab 01/25/21 0135 01/25/21 0830 01/27/21 0323 01/28/21 0417 01/29/21 0403 01/30/21 0338  WBC 11.3*  --  9.0 9.9 8.8 9.3  NEUTROABS 9.7*  --   --   --   --   --   HGB 8.2*   < > 5.8* 8.3* 8.5* 8.6*  HCT 25.6*   < > 18.5* 25.2* 26.1* 27.0*  MCV 100.0  --  103.4* 95.1 93.9 95.1  PLT 440*  --  359 348  357 345   < > = values in this interval not displayed.   Blood Culture    Component Value Date/Time   SDES WOUND 01/25/2021 0814   SPECREQUEST ABSC 01/25/2021 0814   CULT FEW STAPHYLOCOCCUS EPIDERMIDIS 01/25/2021 0814   REPTSTATUS 01/27/2021 FINAL 01/25/2021 4818   Studies/Results: DG Shoulder Right Port  Result Date: 01/29/2021 CLINICAL DATA:  Diminished range of motion involving the RIGHT shoulder. EXAM: PORTABLE RIGHT SHOULDER 3 VIEWS COMPARISON:  Visualized RIGHT shoulder on multiple prior chest x-rays. No prior RIGHT shoulder imaging. FINDINGS: Osseous demineralization. No evidence of acute fracture or glenohumeral dislocation. Osteolysis involving the distal clavicle. Acromioclavicular joint anatomically aligned. Widening of the subacromial space, likely indicating a joint effusion. IMPRESSION: 1. No acute osseous abnormality. 2. Suspected joint effusion. 3. Chronic osteolysis of the distal clavicle. Electronically Signed   By: Evangeline Dakin M.D.   On: 01/29/2021 13:20    Medications: . sodium  chloride 65 mL/hr at 01/29/21 2012  .  ceFAZolin (ANCEF) IV Stopped (01/29/21 1734)  . dialysis solution 1.5% low-MG/low-CA     . acetaminophen  1,000 mg Oral TID  . benztropine  1 mg Oral QHS  . Chlorhexidine Gluconate Cloth  6 each Topical Daily  . diclofenac Sodium  2 g Topical QID  . divalproex  750 mg Oral BID  . docusate sodium  100 mg Oral BID  . FLUoxetine  20 mg Oral Daily  . gentamicin cream  1 application Topical Daily  . dianeal solution for CAPD/CCPD with heparin   Peritoneal Dialysis Q24H  . iron polysaccharides  150 mg Oral QODAY  . levETIRAcetam  500 mg Oral BID  . levothyroxine  50 mcg Oral Q breakfast  . lidocaine  1 patch Transdermal Daily  . losartan  25 mg Oral Daily  . multivitamin  1 tablet Oral Daily  . pantoprazole  40 mg Oral Daily  . risperiDONE  2 mg Oral QHS  . sevelamer carbonate  800 mg Oral BID WC  . sevelamer carbonate  800 mg Oral With snacks  .  vitamin B-12  500 mcg Oral Daily    Dialysis Orders: Dr Nancy Marus in [place] is her nephrologist 4 exchanges over night, 2 L each. Dry wt = 71.5kg  Assessment/ Plan: 1. Discitis/ osteomyelitis/ epidural abscess - sp L5- S1 laminectomy+  epidural abscess evac on 01/25/21. IR to place tunneled central cath for ABX, to get IV Ancef thru 5/17 (6 wks per ID). BC with gram+ cocci but no organism detected. Pain control per primary.  2. ESRD - on PD. Does 2 liter exchanges, 4 every night. Cont PD nightly while here. Add heparin to all fluids due to fibrin issues.  3. HTN/ vol - Blood pressure elevated today, possibly d/t pain. cont losartan 25 qd, if not improved will need to increase dose.  Currently under dry weight continue using all 1.5% bags with PD. Cont IVF"s at 65 cc/hr for vol support. Wts stable at 70.5kg.  4. Schizophrenia/ bipolar - per pmd 5. Seizure d/o - per pmd 6. MBD ckd - corr Ca 9.36, phos in goal. Cont renvela, sensipar when no longer NPO 7. Anemia ckd / abl post-op: Hb 8.2 then drop to 5.8, now improved to 8.6 s/p 2 units pRBC on 4/7.  Start aranesp 28mcg qwk.  8. Nutrition - Renal diet w/fluid restrictions. Alb very low at 1.4. order protein supplements   Jen Mow, PA-C [facility] 01/30/2021,10:44 AM  LOS: 5 days

## 2021-01-31 LAB — CULTURE, BLOOD (ROUTINE X 2): Special Requests: ADEQUATE

## 2021-01-31 MED ORDER — PROSOURCE PLUS PO LIQD
30.0000 mL | Freq: Two times a day (BID) | ORAL | Status: DC
  Administered 2021-01-31: 30 mL via ORAL
  Filled 2021-01-31 (×2): qty 30

## 2021-01-31 MED ORDER — DARBEPOETIN ALFA 100 MCG/0.5ML IJ SOSY
100.0000 ug | PREFILLED_SYRINGE | INTRAMUSCULAR | Status: DC
Start: 2021-01-31 — End: 2021-02-05
  Administered 2021-01-31: 100 ug via SUBCUTANEOUS
  Filled 2021-01-31: qty 0.5

## 2021-01-31 NOTE — Progress Notes (Signed)
Physical Therapy Treatment Patient Details Name: Jane [name] MRN: [id number] DOB: Jun 10, 1966 Today's Date: 01/31/2021    History of Present Illness The patient is a 55 y.o. year-old w/ hx of schizophrenia, seizure d/o, ESRD on PD, mental retardation (moderate severity), HTN, HL, hypothyroid, bipolar d/o, anemia presented yest to ED w/ abd pain, back pain and leg pain for 1 month. Pt is on home peritoneal dialysis for the last 6 yrs, f/[place] nephrology [place] W/U showed osteo/ discitis at L5-S1 w/ destructive changes of the anterior superior endplate of S1. Also large lateral epidural abscess causing significant compression of the thecal sac. Pt was taken to surgery for L5-S1 laminectomy and evacuation of epidural abscess.  Postop pt is on 5 N. Pt lives with sister who provides 24/7 care.    PT Comments    Pt in sidelying this session.  Pt continues to cry out during session.  Sister reports this is behavioral at home but she also appears to be in a great deal of pain with mobility ( particularly her R shoulder).  Pt progressed to standing this session but remains flexed at hips and trunk.  Continue to recommend snf placement in a post acute setting.      Follow Up Recommendations  SNF;Supervision/Assistance - 24 hour     Equipment Recommendations  Wheelchair cushion (measurements PT);Wheelchair (measurements PT);Rolling walker with 5" wheels;3in1 (PT)    Recommendations for Other Services       Precautions / Restrictions Precautions Precautions: Back;Other (comment) Precaution Booklet Issued: Yes (comment) Precaution Comments: spinal precautions Required Braces or Orthoses: Spinal Brace Spinal Brace: Lumbar corset;Applied in sitting position Restrictions Weight Bearing Restrictions: No Other Position/Activity Restrictions: No bending, lifting, twisting    Mobility  Bed Mobility Overal bed mobility: Needs Assistance Bed Mobility: Rolling;Sidelying to Sit Rolling: Mod assist;+2  for physical assistance Sidelying to sit: Max assist;+2 for physical assistance   Sit to supine: Max assist;+2 for physical assistance;+2 for safety/equipment   General bed mobility comments: Cues for hand placement and assistance to move B LEs to edge of bed and elevate trunk into a seated position.  Pt remains to push posterior once in sitting.    Transfers Overall transfer level: Needs assistance Equipment used: Ambulation equipment used (sara stedy) Transfers: Sit to/from Stand Sit to Stand: Max assist;+2 physical assistance         General transfer comment: Pt able to initiate standing better this session with use of stedy.  Pt reaching with LUE but refused to use RUE.  Pt able to lift hips from edge of bed x 5 with +2 max assistance. She presents with poor posture and decreased hip extension.  Pt able to stand in flexed position x 5 seconds this session.  Ambulation/Gait Ambulation/Gait assistance:  (NT, standing remains difficult.)               Stairs             Wheelchair Mobility    Modified Rankin (Stroke Patients Only)       Balance Overall balance assessment: Needs assistance Sitting-balance support: Bilateral upper extremity supported Sitting balance-Leahy Scale: Poor Sitting balance - Comments: pushing posterior Postural control: Posterior lean   Standing balance-Leahy Scale: Poor Standing balance comment: pushing posterior                            Cognition Arousal/Alertness: Awake/alert Behavior During Therapy: Agitated (biting) Overall Cognitive Status: History of  cognitive impairments - at baseline                                 General Comments: Pt responded to commands this session however became agressive with biting upon when moving R shoulder      Exercises      General Comments        Pertinent Vitals/Pain Pain Assessment: Faces Pain Score: 8  Faces Pain Scale: Hurts whole lot Pain  Location: back and RUE Pain Descriptors / Indicators: Crying (attempting to bite therapist when shoulder was ranged.) Pain Intervention(s): Monitored during session;Repositioned    Home Living                      Prior Function            PT Goals (current goals can now be found in the care plan section) Acute Rehab PT Goals Patient Stated Goal: didn't state PT Goal Formulation: Patient unable to participate in goal setting Potential to Achieve Goals: Fair Progress towards PT goals: Progressing toward goals    Frequency    Min 5X/week      PT Plan Current plan remains appropriate    Co-evaluation              AM-PAC PT "6 Clicks" Mobility   Outcome Measure  Help needed turning from your back to your side while in a flat bed without using bedrails?: A Lot Help needed moving from lying on your back to sitting on the side of a flat bed without using bedrails?: A Lot Help needed moving to and from a bed to a chair (including a wheelchair)?: A Lot Help needed standing up from a chair using your arms (e.g., wheelchair or bedside chair)?: A Lot Help needed to walk in hospital room?: Total Help needed climbing 3-5 steps with a railing? : Total 6 Click Score: 10    End of Session Equipment Utilized During Treatment: Gait belt Activity Tolerance: Patient limited by pain Patient left: in bed;with call bell/phone within reach;with bed alarm set Nurse Communication: Mobility status (hemovac was expanded and nurse to address.) PT Visit Diagnosis: Unsteadiness on feet (R26.81);Pain;Other abnormalities of gait and mobility (R26.89);Muscle weakness (generalized) (M62.81)     Time: 9295-7473 PT Time Calculation (min) (ACUTE ONLY): 20 min  Charges:  $Therapeutic Activity: 8-22 mins                     Erasmo Leventhal , PTA Acute Rehabilitation Services Pager [phone] Office [phone]     Ashton Sabine Eli Hose 01/31/2021, 11:30 AM

## 2021-01-31 NOTE — Plan of Care (Signed)
  Problem: Education: Goal: Knowledge of General Education information will improve Description: Including pain rating scale, medication(s)/side effects and non-pharmacologic comfort measures Outcome: Progressing   Problem: Clinical Measurements: Goal: Respiratory complications will improve Outcome: Progressing Goal: Cardiovascular complication will be avoided Outcome: Progressing   Problem: Activity: Goal: Risk for activity intolerance will decrease Outcome: Progressing   Problem: Coping: Goal: Level of anxiety will decrease Outcome: Progressing   Problem: Pain Managment: Goal: General experience of comfort will improve Outcome: Progressing   Problem: Safety: Goal: Ability to remain free from injury will improve Outcome: Progressing   

## 2021-01-31 NOTE — Progress Notes (Signed)
[facility] Progress Note   Subjective:  Seen in room - just waking up. C-pap removed and RN informed. Denies CP or dyspnea. Looks like PD went fine overnight -  12mL net UF only.  Objective Vitals:   01/30/21 1500 01/31/21 0000 01/31/21 0739 01/31/21 0758  BP: 121/69 (!) 148/81 133/73 (!) 149/74  Pulse: 90 97 93 92  Resp: (!) 21 20  17   Temp: 98.2 F (36.8 C) 98.1 F (36.7 C) 97.7 F (36.5 C) 97.6 F (36.4 C)  TempSrc: Axillary Oral Axillary Oral  SpO2: 97%  98% 97%  Weight:   71.3 kg   Height:       Physical Exam General: Chronically ill appearing woman, NAD. C-pap removed since awake. Heart: RRR; no murmur Lungs: CTA anteriorly Abdomen: soft, non-tender. PD cath in R abdomen/bandaged. Extremities: No LE edema Dialysis Access: PD cath, as above.  Additional Objective Labs: Basic Metabolic Panel: Recent Labs  Lab 01/26/21 0334 01/27/21 0323 01/28/21 0417  NA 128* 128* 132*  K 5.5* 4.4 4.4  CL 91* 91* 93*  CO2 22 22 22   GLUCOSE 80 73 71  BUN 69* 69* 63*  CREATININE 7.07* 7.25* 7.21*  CALCIUM 7.2* 7.2* 7.2*  PHOS 3.8 4.2 5.0*   Liver Function Tests: Recent Labs  Lab 01/25/21 0135 01/26/21 0334 01/27/21 0323 01/28/21 0417  AST 14*  --   --   --   ALT 11  --   --   --   ALKPHOS 319*  --   --   --   BILITOT 0.7  --   --   --   PROT 7.8  --   --   --   ALBUMIN 1.7* 1.4* 1.4* 1.3*   Recent Labs  Lab 01/25/21 0135  LIPASE 38   CBC: Recent Labs  Lab 01/25/21 0135 01/25/21 0830 01/27/21 0323 01/28/21 0417 01/29/21 0403 01/30/21 0338  WBC 11.3*  --  9.0 9.9 8.8 9.3  NEUTROABS 9.7*  --   --   --   --   --   HGB 8.2*   < > 5.8* 8.3* 8.5* 8.6*  HCT 25.6*   < > 18.5* 25.2* 26.1* 27.0*  MCV 100.0  --  103.4* 95.1 93.9 95.1  PLT 440*  --  359 348 357 345   < > = values in this interval not displayed.   Studies/Results: DG Shoulder Right Port  Result Date: 01/29/2021 CLINICAL DATA:  Diminished range of motion involving the RIGHT  shoulder. EXAM: PORTABLE RIGHT SHOULDER 3 VIEWS COMPARISON:  Visualized RIGHT shoulder on multiple prior chest x-rays. No prior RIGHT shoulder imaging. FINDINGS: Osseous demineralization. No evidence of acute fracture or glenohumeral dislocation. Osteolysis involving the distal clavicle. Acromioclavicular joint anatomically aligned. Widening of the subacromial space, likely indicating a joint effusion. IMPRESSION: 1. No acute osseous abnormality. 2. Suspected joint effusion. 3. Chronic osteolysis of the distal clavicle. Electronically Signed   By: Evangeline Dakin M.D.   On: 01/29/2021 13:20   Medications: . sodium chloride 65 mL/hr at 01/31/21 0854  .  ceFAZolin (ANCEF) IV Stopped (01/30/21 1834)  . dialysis solution 1.5% low-MG/low-CA     . acetaminophen  1,000 mg Oral TID  . benztropine  1 mg Oral QHS  . Chlorhexidine Gluconate Cloth  6 each Topical Daily  . cinacalcet  30 mg Oral Q supper  . darbepoetin (ARANESP) injection - NON-DIALYSIS  25 mcg Subcutaneous Q Mon-1800  . diclofenac Sodium  2 g Topical QID  .  divalproex  750 mg Oral BID  . docusate sodium  100 mg Oral BID  . feeding supplement (NEPRO CARB STEADY)  237 mL Oral TID AC  . FLUoxetine  20 mg Oral Daily  . gentamicin cream  1 application Topical Daily  . dianeal solution for CAPD/CCPD with heparin   Peritoneal Dialysis Q24H  . iron polysaccharides  150 mg Oral QODAY  . levETIRAcetam  500 mg Oral BID  . levothyroxine  50 mcg Oral Q breakfast  . lidocaine  1 patch Transdermal Daily  . losartan  25 mg Oral Daily  . multivitamin  1 tablet Oral Daily  . pantoprazole  40 mg Oral Daily  . risperiDONE  2 mg Oral QHS  . sevelamer carbonate  800 mg Oral BID WC  . sevelamer carbonate  800 mg Oral With snacks  . vitamin B-12  500 mcg Oral Daily    Dialysis Orders: CCPD - followed by [facility] (Dr. Nancy Marus) 4 exchanges over night, 2L each. Dry wt = 71.5kg  Assessment/ Plan: 1. Discitis/ osteomyelitis/ epidural abscess:  S/p L5- S1 laminectomy + epidural abscess evacuation on 01/25/21. BCx  4/5 1 of 2 with + GPC, wound Cx grew Staph Epi. IR consulted for tunneled central line for IV abx - to place today. Will be on Cefazolin thru 5/17 (6 wks per ID). 2. ESRD: On CCPD - 4 exchanges, 2L volume. PD nightly while here - needs heparin to all fluids due to visible fibrin. 3. HTN/volume: BP borderline. Using 1.5% PD fluids - currently at EDW. Does not appear overloaded. Continue Losartan 25mg  QD. 4. Schizophrenia/Bipolar: Per primary. 5. Seizure disorder: Per primary. 6. Secondary HPTH: CorrCa and Phos ok - continue sevelamer + sensipar. 7. Anemia of ESRD: Hgb 8.2 -> 5.8 post-op. S/p 2U PRBCs on 4/7. Hgb 8.6. Aranesp started weekly. 8. Nutrition: Albumin very low, continue protein supplements.  Veneta Penton, PA-C 01/31/2021, 9:11 AM  [employer]

## 2021-01-31 NOTE — Consult Note (Signed)
Reason for Consult:Right shoulder pain Referring Physician: Princella Ion Time called: 1109 Time at bedside: Jane [name] is an 55 y.o. female.  HPI: Tracia was admitted about a week ago after a months-long c/o back pain and frequent falls. She was found to have discitis and was taken to surgery by NS. Family noticed her not using her RUE as much compared with baseline and brought it up to attending on 4/9. X-ray showed a probable effusion and orthopedic surgery was consulted. Aside from telling me her right shoulder hurts pt was not cooperative with history or exam and little could be ascertained.  Past Medical History:  Diagnosis Date  . Abnormal gait   . Anemia   . Bipolar disorder ([place])   . Depression   . ESRD on peritoneal dialysis ([place])   . GERD (gastroesophageal reflux disease)   . Hyperlipemia   . Hypertension   . Hypothyroidism   . Moderately mentally retarded   . Renal disease   . Schizophrenia ([place])   . Seizures ([place])    No recent seizures - no meds    Past Surgical History:  Procedure Laterality Date  . BREAST REDUCTION SURGERY    . BREAST SURGERY     breast reduction  . EXAMINATION UNDER ANESTHESIA N/A 12/08/2013   Procedure: EXAM UNDER ANESTHESIA;  Surgeon: Lavonia Drafts, MD;  Location: [facility];  Service: Gynecology;  Laterality: N/A;  Pelvic exam and pap smear, unable to tolerate during last office visit  . FOOT SURGERY     right  . LUMBAR LAMINECTOMY/DECOMPRESSION MICRODISCECTOMY N/A 01/25/2021   Procedure: Lumbar Five -Sacral One Laminectomy for Epidural Abscess;  Surgeon: Earnie Larsson, MD;  Location: [facility];  Service: Neurosurgery;  Laterality: N/A;  . REDUCTION MAMMAPLASTY Bilateral     Family History  Problem Relation Age of Onset  . Hypertension Other   . Breast cancer Mother 56    Social History:  reports that she has never smoked. She has never used smokeless tobacco. She reports that she does not drink alcohol and does not use  drugs.  Allergies:  Allergies  Allergen Reactions  . Icodextrin Rash    Medications: I have reviewed the patient's current medications.  Results for orders placed or performed during the hospital encounter of 01/25/21 (from the past 48 hour(s))  CBC     Status: Abnormal   Collection Time: 01/30/21  3:38 AM  Result Value Ref Range   WBC 9.3 4.0 - 10.5 K/uL   RBC 2.84 (L) 3.87 - 5.11 MIL/uL   Hemoglobin 8.6 (L) 12.0 - 15.0 g/dL   HCT 27.0 (L) 36.0 - 46.0 %   MCV 95.1 80.0 - 100.0 fL   MCH 30.3 26.0 - 34.0 pg   MCHC 31.9 30.0 - 36.0 g/dL   RDW 17.0 (H) 11.5 - 15.5 %   Platelets 345 150 - 400 K/uL   nRBC 0.0 0.0 - 0.2 %    Comment: Performed at [facility], [place] [address], [place], [place] [address]  Culture, blood (Routine X 2) w Reflex to ID Panel     Status: None (Preliminary result)   Collection Time: 01/30/21  3:15 PM   Specimen: BLOOD LEFT HAND  Result Value Ref Range   Specimen Description BLOOD LEFT HAND    Special Requests      BOTTLES DRAWN AEROBIC ONLY Blood Culture adequate volume   Culture      NO GROWTH < 24 HOURS Performed at  [facility], [place] [address] [place], [place] [address]    Report Status PENDING     No results found.  Review of Systems  Unable to perform ROS: Psychiatric disorder   Blood pressure (!) 149/74, pulse 92, temperature 97.6 F (36.4 C), temperature source Oral, resp. rate 17, height 5\' 6"  (1.676 m), weight 71.3 kg, SpO2 97 %. Physical Exam Constitutional:      General: She is not in acute distress.    Appearance: She is well-developed. She is not diaphoretic.  HENT:     Head: Normocephalic and atraumatic.  Eyes:     General: No scleral icterus.       Right eye: No discharge.        Left eye: No discharge.     Conjunctiva/sclera: Conjunctivae normal.  Cardiovascular:     Rate and Rhythm: Normal rate and regular rhythm.  Pulmonary:     Effort: Pulmonary effort is normal. No respiratory distress.  Musculoskeletal:      Cervical back: Normal range of motion.     Comments: Right shoulder, elbow, wrist, digits- no skin wounds, mild TTP, esp posteriorly, no instability, would not move shoulder nor allow passive movement  Sens  Ax/R/M/U could not assess  Mot   Ax/ R/ PIN/ M/ AIN/ U could not assess  Rad 2+  Skin:    General: Skin is warm and dry.  Neurological:     Mental Status: She is alert.  Psychiatric:        Behavior: Behavior normal.     Assessment/Plan: Right shoulder pain -- Will order IR guided tap of shoulder effusion under sedation as I have no hope she would cooperate with bedside aspiration.    Lisette Abu, PA-C Orthopedic Surgery [phone] 01/31/2021, 12:03 PM

## 2021-01-31 NOTE — Progress Notes (Signed)
[facility]  Total days of antibiotics 8               Reason for Consult: discitis    Referring Physician: vann  Principal Problem:   Discitis of lumbosacral region Active Problems:   Schizoaffective disorder ([place])   RETARDATION, MENTAL NOS   Essential hypertension   End-stage renal disease on peritoneal dialysis ([place])   Seizure disorder ([place])   Pressure injury of skin   HPI: Jane [name] is a 55 y.o. female  With schizoaffective disorder with ESRD on PD, admitted for back pain, acute on chronic and found to have epidural abscess and  Discitis/ osteomyelitis s/pL5- S1 laminectomy with abscess abscess evac on 01/25/21. cultures grew MSSE thus patient is cefazolin thru 5/17 (6 wks per ID).  S: patient is afebrile but complains of right arm/shoulder pain. Combative with hitting and bitting this morning with occupational therapist.  Past Medical History:  Diagnosis Date  . Abnormal gait   . Anemia   . Bipolar disorder ([place])   . Depression   . ESRD on peritoneal dialysis ([place])   . GERD (gastroesophageal reflux disease)   . Hyperlipemia   . Hypertension   . Hypothyroidism   . Moderately mentally retarded   . Renal disease   . Schizophrenia ([place])   . Seizures ([place])    No recent seizures - no meds   MEDICATIONS: . (feeding supplement) PROSource Plus  30 mL Oral BID BM  . acetaminophen  1,000 mg Oral TID  . benztropine  1 mg Oral QHS  . Chlorhexidine Gluconate Cloth  6 each Topical Daily  . cinacalcet  30 mg Oral Q supper  . darbepoetin (ARANESP) injection - NON-DIALYSIS  100 mcg Subcutaneous Q Mon-1800  . diclofenac Sodium  2 g Topical QID  . divalproex  750 mg Oral BID  . docusate sodium  100 mg Oral BID  . feeding supplement (NEPRO CARB STEADY)  237 mL Oral TID AC  . FLUoxetine  20 mg Oral Daily  . gentamicin cream  1 application Topical Daily  . dianeal solution for CAPD/CCPD with heparin   Peritoneal Dialysis Q24H  . iron  polysaccharides  150 mg Oral QODAY  . levETIRAcetam  500 mg Oral BID  . levothyroxine  50 mcg Oral Q breakfast  . lidocaine  1 patch Transdermal Daily  . losartan  25 mg Oral Daily  . multivitamin  1 tablet Oral Daily  . pantoprazole  40 mg Oral Daily  . risperiDONE  2 mg Oral QHS  . sevelamer carbonate  800 mg Oral BID WC  . sevelamer carbonate  800 mg Oral With snacks  . vitamin B-12  500 mcg Oral Daily    Social History   Tobacco Use  . Smoking status: Never Smoker  . Smokeless tobacco: Never Used  Substance Use Topics  . Alcohol use: No  . Drug use: No    Family History  Problem Relation Age of Onset  . Hypertension Other   . Breast cancer Mother 6    Review of Systems -  Constitutional: Negative for fever, chills, diaphoresis, activity change, appetite change, fatigue and unexpected weight change.  HENT: Negative for congestion, sore throat, rhinorrhea, sneezing, trouble swallowing and sinus pressure.  Eyes: Negative for photophobia and visual disturbance.  Respiratory: Negative for cough, chest tightness, shortness of breath, wheezing and stridor.  Cardiovascular: Negative for chest pain, palpitations and leg swelling.  Gastrointestinal: Negative for nausea,  vomiting, abdominal pain, diarrhea, constipation, blood in stool, abdominal distention and anal bleeding.  Genitourinary: Negative for dysuria, hematuria, flank pain and difficulty urinating.  Musculoskeletal: right shoulder pain Skin: Negative for color change, pallor, rash and wound.  Neurological: Negative for dizziness, tremors, weakness and light-headedness.  Hematological: Negative for adenopathy. Does not bruise/bleed easily.  Psychiatric/Behavioral: Negative for behavioral problems, confusion, sleep disturbance, dysphoric mood, decreased concentration and agitation.      OBJECTIVE: Temp:  [97.4 F (36.3 C)-98.1 F (36.7 C)] 97.4 F (36.3 C) (04/11 1509) Pulse Rate:  [90-97] 90 (04/11 1509) Resp:   [17-20] 17 (04/11 1509) BP: (133-150)/(73-81) 150/80 (04/11 1509) SpO2:  [97 %-98 %] 97 % (04/11 1509) Weight:  [71.3 kg] 71.3 kg (04/11 0739) Physical Exam  Constitutional:  oriented to person. appears well-developed and well-nourished. No distress.  HENT: [place]/AT, PERRLA, no scleral icterus Mouth/Throat: Oropharynx is clear and moist. No oropharyngeal exudate.  Cardiovascular: Normal rate, regular rhythm and normal heart sounds. Exam reveals no gallop and no friction rub.  No murmur heard.  Pulmonary/Chest: Effort normal and breath sounds normal. No respiratory distress.  has no wheezes.  Neck = supple, no nuchal rigidity, ij in place, appropriately covered. Abdominal: Soft. Bowel sounds are normal.  exhibits no distension. There is no tenderness.  Lymphadenopathy: no cervical adenopathy. No axillary adenopathy Neurological: alert and oriented to person, but doesn't follow commands. Guarding right shoulder Skin: Skin is warm and dry. No rash noted. No erythema.  Psychiatric: a normal mood and affect.  behavior is normal.    LABS: Results for orders placed or performed during the hospital encounter of 01/25/21 (from the past 48 hour(s))  CBC     Status: Abnormal   Collection Time: 01/30/21  3:38 AM  Result Value Ref Range   WBC 9.3 4.0 - 10.5 K/uL   RBC 2.84 (L) 3.87 - 5.11 MIL/uL   Hemoglobin 8.6 (L) 12.0 - 15.0 g/dL   HCT 27.0 (L) 36.0 - 46.0 %   MCV 95.1 80.0 - 100.0 fL   MCH 30.3 26.0 - 34.0 pg   MCHC 31.9 30.0 - 36.0 g/dL   RDW 17.0 (H) 11.5 - 15.5 %   Platelets 345 150 - 400 K/uL   nRBC 0.0 0.0 - 0.2 %    Comment: Performed at [facility], [address], [place], [place] [address]  Culture, blood (Routine X 2) w Reflex to ID Panel     Status: None (Preliminary result)   Collection Time: 01/30/21  3:15 PM   Specimen: BLOOD LEFT HAND  Result Value Ref Range   Specimen Description BLOOD LEFT HAND    Special Requests      BOTTLES DRAWN AEROBIC ONLY Blood Culture  adequate volume   Culture      NO GROWTH < 24 HOURS Performed at [facility], [place] [address] [place], [place] [address]    Report Status PENDING     MICRO: 4/5 blood cx + on 4/10 with micrococcus in 1 of 4 bottles 4/5 aerobic cx, epidural abscess = MSSE  IMAGING: MRI: 1. Severe Discitis-Osteomyelitis at L5-S1 with bulky prevertebral and multifocal epidural abscesses. Prevertebral abscess component extending from L5-S3 estimated at 14 mL. Discontinuous L5 and sacral epidural abscesses with dural thickening result in severe spinal stenosis below the L4-L5 level. Bilateral L5 foraminal involvement. Mild stenosis at L4-L5 from dural thickening.  2. Comparatively mild paraspinal soft tissue phlegmon at this time. Generalized presacral edema. Mild marrow edema in  the sacral ala, but no definite SI joint infection at this time.  3. Underlying renal osteodystrophy, and stable abdominal viscera as seen by CT today.  Assessment/Plan:    Staph epi discitis/epidural abscess = recommend tunneled catheter, as long as she doesn't pull on it. Appears to tolerate right IJ central line. Will need 6 wk of iv abtx  Micrococcus bacteremia = only detected in 1 of 4 bottles. Suspect it is contaminant since not identified in epidural abscess culture. Would be covered by cefazolin.  Right shoulder pain = agree with surgery consultants to doing IR aspiration  Of right shoulder effusion under general anesthesia due to combativeness. Please send fluid for cell count, gram stain and culture

## 2021-01-31 NOTE — Progress Notes (Signed)
Progress Note    Jane [name]  GPQ:[id number] DOB: 13-Aug-1966  DOA: 01/25/2021 PCP: [name], MD    Brief Narrative:     Medical records reviewed and are as summarized below:  Jane [name] is an 55 y.o. female with medical history significant of intellectual disability; seizure d/o; schizophrenia/bipolar; hypothyroidism; HTN; HLD; and ESRD on PD presenting with back pain.  Found to have epidural abscess when taken to the OR by neurosurgery.  Right should effusion.     Assessment/Plan:   Principal Problem:   Discitis of lumbosacral region Active Problems:   Schizoaffective disorder (HCC)   RETARDATION, MENTAL NOS   Essential hypertension   End-stage renal disease on peritoneal dialysis (HCC)   Seizure disorder (HCC)   Pressure injury of skin    Epidural abscess/Discitis- MSSE -Patient with progressive back pain, decreased rectal tone, increased difficulty ambulating with increasing falls -MRI showed severe discitis-osteomyelitis at L5-S1 with multifocal epidural abscesses -Neurosurgery consulted and took patient to the OR urgently for decompressive laminectomy and abscess evacuation -quantiferon gold negative -ID consult to help manage abx appreciated: Continue Cefazolin through 5/17 -will need IJ PICC Line via IR prior to d/c per nephrology -blood culture + after 5 days-- will get repeat BC prior to tunneled catheter for IV Abx  ? Right shoulder effusion -ortho consult -IR to tap joint: cell count more important than culture  ESRD on PD -Patient on chronic PD -Nephrology is consulting and will order PD -Continue Sensipar, Renvela  mental health issues -Uncertain baseline level of function -Continue home meds - Cogentin, Prozac, Risperdal -periods of agitation: will ask family to stay and will do low dose ativan IV  Seizure d/o -Continue Depakote (dose adjusted), Keppra  Hypothyroidism -Continue Synthroid at current dose as TSH  normal  HTN -Continue Cozaar  Anemia of CKD -s/p 2 units   Family Communication/Anticipated D/C date and plan/Code Status   DVT prophylaxis: Lovenox ordered. Code Status: Full Code.  Disposition Plan: Status is: Inpatient Brother in law at bedside 4/10 Remains inpatient appropriate because:Inpatient level of care appropriate due to severity of illness   Dispo: The patient is from: Home              Anticipated d/c is to: tbd              Patient currently is not medically stable to d/c.   Difficult to place patient No         Medical Consultants:    ID  NS.     Subjective:   Sleeping on Bipap  Objective:    Vitals:   01/30/21 1500 01/31/21 0000 01/31/21 0739 01/31/21 0758  BP: 121/69 (!) 148/81 133/73 (!) 149/74  Pulse: 90 97 93 92  Resp: (!) 21 20  17   Temp: 98.2 F (36.8 C) 98.1 F (36.7 C) 97.7 F (36.5 C) 97.6 F (36.4 C)  TempSrc: Axillary Oral Axillary Oral  SpO2: 97%  98% 97%  Weight:   71.3 kg   Height:        Intake/Output Summary (Last 24 hours) at 01/31/2021 1434 Last data filed at 01/31/2021 0854 Gross per 24 hour  Intake 2002.88 ml  Output 0 ml  Net 2002.88 ml   Filed Weights   01/28/21 0650 01/30/21 0655 01/31/21 0739  Weight: 70.6 kg 70.6 kg 71.3 kg    Exam:  General: Appearance:     Overweight female in bed     Lungs:  respirations unlabored  Heart:    Normal heart rate. Normal rhythm. No murmurs, rubs, or gallops.   MS:   All extremities are intact.   Neurologic:   Awake, yelling out at times       Data Reviewed:   I have personally reviewed following labs and imaging studies:  Labs: Labs show the following:   Basic Metabolic Panel: Recent Labs  Lab 01/25/21 0135 01/25/21 0830 01/26/21 0334 01/27/21 0323 01/28/21 0417  NA 129* 128* 128* 128* 132*  K 2.9* 3.6 5.5* 4.4 4.4  CL 86*  --  91* 91* 93*  CO2 26  --  22 22 22   GLUCOSE 88  --  80 73 71  BUN 64*  --  69* 69* 63*  CREATININE 7.01*  --   7.07* 7.25* 7.21*  CALCIUM 7.5*  --  7.2* 7.2* 7.2*  PHOS  --   --  3.8 4.2 5.0*   GFR Estimated Creatinine Clearance: 9 mL/min (A) (by C-G formula based on SCr of 7.21 mg/dL (H)). Liver Function Tests: Recent Labs  Lab 01/25/21 0135 01/26/21 0334 01/27/21 0323 01/28/21 0417  AST 14*  --   --   --   ALT 11  --   --   --   ALKPHOS 319*  --   --   --   BILITOT 0.7  --   --   --   PROT 7.8  --   --   --   ALBUMIN 1.7* 1.4* 1.4* 1.3*   Recent Labs  Lab 01/25/21 0135  LIPASE 38   No results for input(s): AMMONIA in the last 168 hours. Coagulation profile No results for input(s): INR, PROTIME in the last 168 hours.  CBC: Recent Labs  Lab 01/25/21 0135 01/25/21 0830 01/27/21 0323 01/28/21 0417 01/29/21 0403 01/30/21 0338  WBC 11.3*  --  9.0 9.9 8.8 9.3  NEUTROABS 9.7*  --   --   --   --   --   HGB 8.2* 7.5* 5.8* 8.3* 8.5* 8.6*  HCT 25.6* 22.0* 18.5* 25.2* 26.1* 27.0*  MCV 100.0  --  103.4* 95.1 93.9 95.1  PLT 440*  --  359 348 357 345   Cardiac Enzymes: No results for input(s): CKTOTAL, CKMB, CKMBINDEX, TROPONINI in the last 168 hours. BNP (last 3 results) No results for input(s): PROBNP in the last 8760 hours. CBG: No results for input(s): GLUCAP in the last 168 hours. D-Dimer: No results for input(s): DDIMER in the last 72 hours. Hgb A1c: No results for input(s): HGBA1C in the last 72 hours. Lipid Profile: No results for input(s): CHOL, HDL, LDLCALC, TRIG, CHOLHDL, LDLDIRECT in the last 72 hours. Thyroid function studies: No results for input(s): TSH, T4TOTAL, T3FREE, THYROIDAB in the last 72 hours.  Invalid input(s): FREET3 Anemia work up: No results for input(s): VITAMINB12, FOLATE, FERRITIN, TIBC, IRON, RETICCTPCT in the last 72 hours. Sepsis Labs: Recent Labs  Lab 01/27/21 0323 01/28/21 0417 01/29/21 0403 01/30/21 0338  WBC 9.0 9.9 8.8 9.3    Microbiology Recent Results (from the past 240 hour(s))  Blood culture (routine x 2)     Status: None    Collection Time: 01/25/21  4:37 AM   Specimen: BLOOD  Result Value Ref Range Status   Specimen Description BLOOD RIGHT ANTECUBITAL  Final   Special Requests   Final    BOTTLES DRAWN AEROBIC AND ANAEROBIC Blood Culture adequate volume   Culture   Final    NO GROWTH 5  DAYS Performed at [facility], [place] [address], [place], [place] [address]    Report Status 01/30/2021 FINAL  Final  Resp Panel by RT-PCR (Flu A&B, Covid) Nasopharyngeal Swab     Status: None   Collection Time: 01/25/21  4:37 AM   Specimen: Nasopharyngeal Swab; Nasopharyngeal(NP) swabs in vial transport medium  Result Value Ref Range Status   SARS Coronavirus 2 by RT PCR NEGATIVE NEGATIVE Final    Comment: (NOTE) SARS-CoV-2 target nucleic acids are NOT DETECTED.  The SARS-CoV-2 RNA is generally detectable in upper respiratory specimens during the acute phase of infection. The lowest concentration of SARS-CoV-2 viral copies this assay can detect is 138 copies/mL. A negative result does not preclude SARS-Cov-2 infection and should not be used as the sole basis for treatment or other patient management decisions. A negative result may occur with  improper specimen collection/handling, submission of specimen other than nasopharyngeal swab, presence of viral mutation(s) within the areas targeted by this assay, and inadequate number of viral copies(<138 copies/mL). A negative result must be combined with clinical observations, patient history, and epidemiological information. The expected result is Negative.  Fact Sheet for Patients:  EntrepreneurPulse.com.au  Fact Sheet for Healthcare Providers:  IncredibleEmployment.be  This test is no t yet approved or cleared by the [place] FDA and  has been authorized for detection and/or diagnosis of SARS-CoV-2 by FDA under an Emergency Use Authorization (EUA). This EUA will remain  in effect (meaning this test can be used) for the  duration of the COVID-19 declaration under Section 564(b)(1) of the Act, 21 U.S.C.section 360bbb-3(b)(1), unless the authorization is terminated  or revoked sooner.       Influenza A by PCR NEGATIVE NEGATIVE Final   Influenza B by PCR NEGATIVE NEGATIVE Final    Comment: (NOTE) The Xpert Xpress SARS-CoV-2/FLU/RSV plus assay is intended as an aid in the diagnosis of influenza from Nasopharyngeal swab specimens and should not be used as a sole basis for treatment. Nasal washings and aspirates are unacceptable for Xpert Xpress SARS-CoV-2/FLU/RSV testing.  Fact Sheet for Patients: EntrepreneurPulse.com.au  Fact Sheet for Healthcare Providers: IncredibleEmployment.be  This test is not yet approved or cleared by the [place] FDA and has been authorized for detection and/or diagnosis of SARS-CoV-2 by FDA under an Emergency Use Authorization (EUA). This EUA will remain in effect (meaning this test can be used) for the duration of the COVID-19 declaration under Section 564(b)(1) of the Act, 21 U.S.C. section 360bbb-3(b)(1), unless the authorization is terminated or revoked.  Performed at [facility], [place] [address] [place], [place] [address]   Blood culture (routine x 2)     Status: Abnormal   Collection Time: 01/25/21  4:38 AM   Specimen: BLOOD RIGHT HAND  Result Value Ref Range Status   Specimen Description BLOOD RIGHT HAND  Final   Special Requests   Final    BOTTLES DRAWN AEROBIC AND ANAEROBIC Blood Culture adequate volume   Culture  Setup Time   Final    GRAM POSITIVE COCCI AEROBIC BOTTLE ONLY CRITICAL RESULT CALLED TO, READ BACK BY AND VERIFIED WITH: PAM ROGERS, RN 01/30/2021 @0132  A.HUGHES    Culture (A)  Final    MICROCOCCUS LUTEUS/LYLAE THE SIGNIFICANCE OF ISOLATING THIS ORGANISM FROM A SINGLE SET OF BLOOD CULTURES WHEN MULTIPLE SETS ARE DRAWN IS UNCERTAIN. PLEASE NOTIFY THE MICROBIOLOGY DEPARTMENT WITHIN ONE WEEK IF  SPECIATION AND SENSITIVITIES ARE REQUIRED. Performed at [facility], [place] [address],  [place], [place] [address]    Report Status 01/31/2021 FINAL  Final  Blood Culture ID Panel (Reflexed)     Status: None   Collection Time: 01/25/21  4:38 AM  Result Value Ref Range Status   Enterococcus faecalis NOT DETECTED NOT DETECTED Corrected   Enterococcus Faecium NOT DETECTED NOT DETECTED Corrected   Listeria monocytogenes NOT DETECTED NOT DETECTED Corrected   Staphylococcus species NOT DETECTED NOT DETECTED Corrected   Staphylococcus aureus (BCID) NOT DETECTED NOT DETECTED Corrected   Staphylococcus epidermidis NOT DETECTED NOT DETECTED Corrected   Staphylococcus lugdunensis NOT DETECTED NOT DETECTED Corrected   Streptococcus species NOT DETECTED NOT DETECTED Corrected   Streptococcus agalactiae NOT DETECTED NOT DETECTED Corrected   Streptococcus pneumoniae NOT DETECTED NOT DETECTED Corrected   Streptococcus pyogenes NOT DETECTED NOT DETECTED Corrected   A.calcoaceticus-baumannii NOT DETECTED NOT DETECTED Corrected   Bacteroides fragilis NOT DETECTED NOT DETECTED Corrected   Enterobacterales NOT DETECTED NOT DETECTED Corrected   Enterobacter cloacae complex NOT DETECTED NOT DETECTED Corrected   Escherichia coli NOT DETECTED NOT DETECTED Corrected   Klebsiella aerogenes NOT DETECTED NOT DETECTED Corrected   Klebsiella oxytoca NOT DETECTED NOT DETECTED Corrected   Klebsiella pneumoniae NOT DETECTED NOT DETECTED Corrected   Proteus species NOT DETECTED NOT DETECTED Corrected   Salmonella species NOT DETECTED NOT DETECTED Corrected   Serratia marcescens NOT DETECTED NOT DETECTED Corrected   Haemophilus influenzae NOT DETECTED NOT DETECTED Corrected   Neisseria meningitidis NOT DETECTED NOT DETECTED Corrected   Pseudomonas aeruginosa NOT DETECTED NOT DETECTED Corrected   Stenotrophomonas maltophilia NOT DETECTED NOT DETECTED Corrected   Candida albicans NOT DETECTED NOT DETECTED Corrected    Candida auris NOT DETECTED NOT DETECTED Corrected   Candida glabrata NOT DETECTED NOT DETECTED Corrected   Candida krusei NOT DETECTED NOT DETECTED Corrected   Candida parapsilosis NOT DETECTED NOT DETECTED Corrected   Candida tropicalis NOT DETECTED NOT DETECTED Corrected   Cryptococcus neoformans/gattii NOT DETECTED NOT DETECTED Corrected    Comment: Performed at [facility], [address], [place], [place] [address]  Aerobic/Anaerobic Culture w Gram Stain (surgical/deep wound)     Status: None   Collection Time: 01/25/21  8:08 AM   Specimen: PATH Other; Body Fluid  Result Value Ref Range Status   Specimen Description ABSCESS  Final   Special Requests EPIDUAL ABSCESS SPEC A  Final   Gram Stain NO WBC SEEN NO ORGANISMS SEEN   Final   Culture   Final    RARE STAPHYLOCOCCUS EPIDERMIDIS NO ANAEROBES ISOLATED Performed at [facility], [address], [place], [place] [address]    Report Status 01/30/2021 FINAL  Final   Organism ID, Bacteria STAPHYLOCOCCUS EPIDERMIDIS  Final      Susceptibility   Staphylococcus epidermidis - MIC*    CIPROFLOXACIN <=0.5 SENSITIVE Sensitive     ERYTHROMYCIN >=8 RESISTANT Resistant     GENTAMICIN <=0.5 SENSITIVE Sensitive     OXACILLIN <=0.25 SENSITIVE Sensitive     TETRACYCLINE <=1 SENSITIVE Sensitive     VANCOMYCIN 1 SENSITIVE Sensitive     TRIMETH/SULFA <=10 SENSITIVE Sensitive     CLINDAMYCIN <=0.25 SENSITIVE Sensitive     RIFAMPIN <=0.5 SENSITIVE Sensitive     Inducible Clindamycin NEGATIVE Sensitive     * RARE STAPHYLOCOCCUS EPIDERMIDIS  Aerobic Culture w Gram Stain (superficial specimen)     Status: None   Collection Time: 01/25/21  8:14 AM   Specimen: Wound  Result Value Ref Range Status   Specimen Description WOUND  Final   Special Requests ABSC  Final   Gram Stain   Final    RARE WBC PRESENT,BOTH PMN AND MONONUCLEAR RARE GRAM POSITIVE COCCI IN PAIRS Performed at [facility], [place] [address] [place], [place]  [address]    Culture FEW STAPHYLOCOCCUS EPIDERMIDIS  Final   Report Status 01/27/2021 FINAL  Final   Organism ID, Bacteria STAPHYLOCOCCUS EPIDERMIDIS  Final      Susceptibility   Staphylococcus epidermidis - MIC*    CIPROFLOXACIN <=0.5 SENSITIVE Sensitive     ERYTHROMYCIN >=8 RESISTANT Resistant     GENTAMICIN <=0.5 SENSITIVE Sensitive     OXACILLIN <=0.25 SENSITIVE Sensitive     TETRACYCLINE <=1 SENSITIVE Sensitive     VANCOMYCIN 1 SENSITIVE Sensitive     TRIMETH/SULFA <=10 SENSITIVE Sensitive     CLINDAMYCIN <=0.25 SENSITIVE Sensitive     RIFAMPIN <=0.5 SENSITIVE Sensitive     Inducible Clindamycin NEGATIVE Sensitive     * FEW STAPHYLOCOCCUS EPIDERMIDIS  Culture, blood (Routine X 2) w Reflex to ID Panel     Status: None (Preliminary result)   Collection Time: 01/30/21  3:15 PM   Specimen: BLOOD LEFT HAND  Result Value Ref Range Status   Specimen Description BLOOD LEFT HAND  Final   Special Requests   Final    BOTTLES DRAWN AEROBIC ONLY Blood Culture adequate volume   Culture   Final    NO GROWTH < 24 HOURS Performed at [facility], [place] [address], [place], [place] [address]    Report Status PENDING  Incomplete    Procedures and diagnostic studies:  No results found.  Medications:   . (feeding supplement) PROSource Plus  30 mL Oral BID BM  . acetaminophen  1,000 mg Oral TID  . benztropine  1 mg Oral QHS  . Chlorhexidine Gluconate Cloth  6 each Topical Daily  . cinacalcet  30 mg Oral Q supper  . darbepoetin (ARANESP) injection - NON-DIALYSIS  100 mcg Subcutaneous Q Mon-1800  . diclofenac Sodium  2 g Topical QID  . divalproex  750 mg Oral BID  . docusate sodium  100 mg Oral BID  . feeding supplement (NEPRO CARB STEADY)  237 mL Oral TID AC  . FLUoxetine  20 mg Oral Daily  . gentamicin cream  1 application Topical Daily  . dianeal solution for CAPD/CCPD with heparin   Peritoneal Dialysis Q24H  . iron polysaccharides  150 mg Oral QODAY  . levETIRAcetam  500 mg Oral  BID  . levothyroxine  50 mcg Oral Q breakfast  . lidocaine  1 patch Transdermal Daily  . losartan  25 mg Oral Daily  . multivitamin  1 tablet Oral Daily  . pantoprazole  40 mg Oral Daily  . risperiDONE  2 mg Oral QHS  . sevelamer carbonate  800 mg Oral BID WC  . sevelamer carbonate  800 mg Oral With snacks  . vitamin B-12  500 mcg Oral Daily   Continuous Infusions: . sodium chloride 65 mL/hr at 01/31/21 1305  .  ceFAZolin (ANCEF) IV Stopped (01/30/21 1834)  . dialysis solution 1.5% low-MG/low-CA       LOS: 6 days   Geradine Girt  [facility]   How to contact the [facility] Attending or Consulting provider [place] or covering provider during after hours [place], for this patient?  1. Check the care team in [facility] and look for a) attending/consulting [facility] provider listed and b) the [facility] team listed  2. Log into [email or web address] and use [employer]'s universal password to access. If you do not have the password, please contact the hospital operator. 3. Locate the [facility] provider you are looking for under [facility] and page to a number that you can be directly reached. 4. If you still have difficulty reaching the provider, please page the [facility] (Director on Call) for the Hospitalists listed on amion for assistance.  01/31/2021, 2:34 PM

## 2021-01-31 NOTE — Plan of Care (Signed)
  Problem: Activity: Goal: Risk for activity intolerance will decrease Outcome: Progressing   Problem: Coping: Goal: Level of anxiety will decrease Outcome: Progressing   Problem: Pain Managment: Goal: General experience of comfort will improve Outcome: Progressing   Problem: Safety: Goal: Ability to remain free from injury will improve Outcome: Progressing   Problem: Skin Integrity: Goal: Risk for impaired skin integrity will decrease Outcome: Progressing   

## 2021-01-31 NOTE — Progress Notes (Signed)
hemovac to lower back removed.- honeycomb dressing placed

## 2021-01-31 NOTE — Progress Notes (Signed)
PT placed on cpap for the night. 

## 2021-01-31 NOTE — Progress Notes (Signed)
Occupational Therapy Treatment Patient Details Name: Jane [name] MRN: [id number] DOB: Sep 17, 1966 Today's Date: 01/31/2021    History of present illness The patient is a 55 y.o. year-old w/ hx of schizophrenia, seizure d/o, ESRD on PD, mental retardation (moderate severity), HTN, HL, hypothyroid, bipolar d/o, anemia presented yest to ED w/ abd pain, back pain and leg pain for 1 month. Pt is on home peritoneal dialysis for the last 6 yrs, [place] W/U showed osteo/ discitis at L5-S1 w/ destructive changes of the anterior superior endplate of S1. Also large lateral epidural abscess causing significant compression of the thecal sac. Pt was taken to surgery for L5-S1 laminectomy and evacuation of epidural abscess.  Postop pt is on 5 N. Pt lives with sister who provides 24/7 care.   OT comments  Pt's sister was present for this session, and continues to agree that she and her husband can support her level of care at home. Jane [name] completed bed mobility to assist with dressing with mod A for physical movement and to adhere to back precautions. Upon sitting Jane [name] required max a for posterior lean. Jane [name] became agitated and displayed aggressive behavior this session and could not tolerate any ROM or PROM of her R shoulder. RN and doctor notified of behavioral differences.  OT to continue to follow acutely to progress function in mobility and ADLs. Discharge remains appropriate.   Follow Up Recommendations  Supervision/Assistance - 24 hour;Home health OT;Other (comment) (Sister present for session and continues to agree that she can provide the level of care needed.)    Equipment Recommendations  Other (comment) (RW per sister request)       Precautions / Restrictions Precautions Precautions: Back;Other (comment) Precaution Booklet Issued: Yes (comment) Spinal Brace: Lumbar corset;Applied in sitting position Restrictions Weight Bearing Restrictions: No       Mobility Bed  Mobility Overal bed mobility: Needs Assistance Bed Mobility: Rolling;Sidelying to Sit Rolling: Mod assist Sidelying to sit: Max assist;+2 for safety/equipment   Sit to supine: Max assist;+2 for physical assistance;+2 for safety/equipment   General bed mobility comments: Requires assistance for physical movement and to ashere to back precautions during bed mobility       Balance Overall balance assessment: Needs assistance Sitting-balance support: Bilateral upper extremity supported Sitting balance-Leahy Scale: Poor Sitting balance - Comments: pushing posterior Postural control: Posterior lean                                 ADL either performed or assessed with clinical judgement   ADL Overall ADL's : Needs assistance/impaired;At baseline Eating/Feeding: Maximal assistance;Cueing for safety;Cueing for sequencing;Sitting   Grooming: Maximal assistance;Cueing for sequencing;Sitting   Upper Body Bathing: Maximal assistance;Cueing for safety;Cueing for sequencing;Sitting   Lower Body Bathing: Maximal assistance;+2 for physical assistance;+2 for safety/equipment;Bed level   Upper Body Dressing : Maximal assistance;Cueing for safety;Cueing for sequencing;Sitting   Lower Body Dressing: Total assistance;+2 for safety/equipment;+2 for physical assistance;Cueing for safety;Cueing for sequencing;Cueing for back precautions;Bed level   Toilet Transfer: Total assistance;+2 for physical assistance;+2 for safety/equipment;Cueing for safety;Cueing for sequencing;Stand-pivot   Toileting- Clothing Manipulation and Hygiene: Total assistance;+2 for physical assistance;+2 for safety/equipment;Cueing for safety;Cueing for sequencing;Bed level       Functional mobility during ADLs:  (Pt able to log roll in bed with Mod A for safety/precautions adn physcial assistance to assist in managemnt of dressing. Pt required Max A for sitting balance to prevent posterior lean.)  General ADL  Comments: Pt continues to require mod-Max A for all ADLs, sister present this session and participated in education on back precautions with ADLs.               Cognition Arousal/Alertness: Awake/alert Behavior During Therapy: Agitated Overall Cognitive Status: History of cognitive impairments - at baseline               General Comments: Pt responded to commands this session however became agressive with hitting and biting upon sitting EOB              General Comments      Pertinent Vitals/ Pain       Pain Assessment: Faces Pain Score: 8  Faces Pain Scale: Hurts whole lot Pain Location: back adn RUE Pain Descriptors / Indicators: Crying Pain Intervention(s): Limited activity within patient's tolerance;Monitored during session (RN notified)         Frequency  Min 2X/week        Progress Toward Goals  OT Goals(current goals can now be found in the care plan section)  Progress towards OT goals: Progressing toward goals  Acute Rehab OT Goals Patient Stated Goal: didn't state Time For Goal Achievement: 02/09/21 Potential to Achieve Goals: Fair ADL Goals Pt Will Perform Grooming: with min guard assist;sitting Pt Will Perform Lower Body Dressing: with mod assist;sit to/from stand Pt Will Transfer to Toilet: with mod assist;squat pivot transfer;bedside commode Additional ADL Goal #1: Pt will respond to step by step verbal commands to assist care giver in basic ADLs  Plan Discharge plan remains appropriate    Co-evaluation                 AM-PAC OT "6 Clicks" Daily Activity     Outcome Measure   Help from another person eating meals?: A Little Help from another person taking care of personal grooming?: A Lot Help from another person toileting, which includes using toliet, bedpan, or urinal?: Total Help from another person bathing (including washing, rinsing, drying)?: A Lot Help from another person to put on and taking off regular upper body  clothing?: A Lot Help from another person to put on and taking off regular lower body clothing?: Total 6 Click Score: 11    End of Session    OT Visit Diagnosis: Unsteadiness on feet (R26.81);Other abnormalities of gait and mobility (R26.89);Repeated falls (R29.6);Muscle weakness (generalized) (M62.81);Cognitive communication deficit (R41.841);Pain Pain - Right/Left: Right Pain - part of body: Shoulder   Activity Tolerance Patient limited by pain;Treatment limited secondary to agitation   Patient Left in bed;with call bell/phone within reach;with bed alarm set;with family/visitor present   Nurse Communication  (Communicated agressive behavior to RN)        Time: 1022-1040 OT Time Calculation (min): 18 min  Charges: OT General Charges $OT Visit: 1 Visit OT Treatments $Self Care/Home Management : 8-22 mins    Kasiya Burck A Jimmie Rueter 01/31/2021, 11:09 AM

## 2021-01-31 NOTE — Progress Notes (Addendum)
   Providing Compassionate, Quality Care - Together   Subjective: Patient denies pain.  Objective: Vital signs in last 24 hours: Temp:  [97.6 F (36.4 C)-98.2 F (36.8 C)] 97.6 F (36.4 C) (04/11 0758) Pulse Rate:  [90-97] 92 (04/11 0758) Resp:  [17-21] 17 (04/11 0758) BP: (121-149)/(69-81) 149/74 (04/11 0758) SpO2:  [97 %-98 %] 97 % (04/11 0758) Weight:  [71.3 kg] 71.3 kg (04/11 0739)  Intake/Output from previous day: No intake/output data recorded. Intake/Output this shift: Total I/O In: 2002.9 [I.V.:1952.9; IV Piggyback:50] Out: -    Alert PERRLA MAE, Strengthappears intact Incision is covered with Honeycomb dressing and Steri Strips; Dressing is clean, dry, and intact. Hemovac drain in place.  Lab Results: Recent Labs    01/29/21 0403 01/30/21 0338  WBC 8.8 9.3  HGB 8.5* 8.6*  HCT 26.1* 27.0*  PLT 357 345   BMET No results for input(s): NA, K, CL, CO2, GLUCOSE, BUN, CREATININE, CALCIUM in the last 72 hours.  Studies/Results: No results found.  Assessment/Plan: Patient underwent L5-S1 decompressive laminectomy with evacuation of epidural abscess on 01/25/2021 by Dr. Annette Stable. Right shoulder effusion noted on x-ray. Ortho ordered an IR guided tap to be performed under sedation.   LOS: 6 days    -Remove Hemovac drain -Continue to mobilize as tolerated. -Pain control. -ABX per ID.Startedon cefazolin 01/27/2021. Will need 6 weeks abx therapy(end date 03/09/2021). -Recommendation is for SNF at discharge   Viona Gilmore, [place], AGNP-C Nurse Practitioner  [facility] [address], [address], [place], [place] [address] P: [phone]    F: [phone]  01/31/2021, 2:40 PM

## 2021-02-01 ENCOUNTER — Inpatient Hospital Stay (HOSPITAL_COMMUNITY): Payer: Medicare Other

## 2021-02-01 DIAGNOSIS — M009 Pyogenic arthritis, unspecified: Secondary | ICD-10-CM

## 2021-02-01 DIAGNOSIS — B9689 Other specified bacterial agents as the cause of diseases classified elsewhere: Secondary | ICD-10-CM | POA: Diagnosis not present

## 2021-02-01 DIAGNOSIS — N186 End stage renal disease: Secondary | ICD-10-CM | POA: Diagnosis not present

## 2021-02-01 DIAGNOSIS — M4647 Discitis, unspecified, lumbosacral region: Secondary | ICD-10-CM | POA: Diagnosis not present

## 2021-02-01 HISTORY — PX: IR FLUORO GUIDE CV LINE RIGHT: IMG2283

## 2021-02-01 HISTORY — PX: IR US GUIDE VASC ACCESS RIGHT: IMG2390

## 2021-02-01 LAB — SYNOVIAL CELL COUNT + DIFF, W/ CRYSTALS
Crystals, Fluid: NONE SEEN
Eosinophils-Synovial: 0 % (ref 0–1)
Lymphocytes-Synovial Fld: 1 % (ref 0–20)
Monocyte-Macrophage-Synovial Fluid: 4 % — ABNORMAL LOW (ref 50–90)
Neutrophil, Synovial: 95 % — ABNORMAL HIGH (ref 0–25)
WBC, Synovial: 16500 /mm3 — ABNORMAL HIGH (ref 0–200)

## 2021-02-01 MED ORDER — LIDOCAINE HCL 1 % IJ SOLN
INTRAMUSCULAR | Status: AC
  Filled 2021-02-01: qty 20

## 2021-02-01 MED ORDER — HEPARIN SODIUM (PORCINE) 1000 UNIT/ML IJ SOLN
INTRAMUSCULAR | Status: AC
  Filled 2021-02-01: qty 1

## 2021-02-01 MED ORDER — LIDOCAINE HCL (PF) 1 % IJ SOLN
5.0000 mL | Freq: Once | INTRAMUSCULAR | Status: AC
  Administered 2021-02-01: 3 mL via INTRADERMAL
  Filled 2021-02-01: qty 5

## 2021-02-01 MED ORDER — LIDOCAINE HCL 1 % IJ SOLN
INTRAMUSCULAR | Status: DC | PRN
  Administered 2021-02-01: 10 mL

## 2021-02-01 NOTE — Progress Notes (Signed)
[facility] Progress Note   Subjective:  Seen in room - just finishing up with PD. No CP, abdominal pain, dyspnea at the moment. BP a little higher today but does not appear overloaded, net UF from PD 835mL.  Objective Vitals:   01/31/21 1509 01/31/21 2130 02/01/21 0500 02/01/21 0837  BP: (!) 150/80 140/74 (!) 150/75 (!) 146/74  Pulse: 90 95 92 92  Resp: 17 18 20 18   Temp: (!) 97.4 F (36.3 C) 98.3 F (36.8 C) 97.8 F (36.6 C) 98.4 F (36.9 C)  TempSrc: Oral Axillary Axillary Oral  SpO2: 97% 98% 95% 98%  Weight:      Height:       Physical Exam General: Chronically ill appearing woman, NAD. Heart: RRR; no murmur Lungs: CTA anteriorly Abdomen: soft, non-tender. PD cath in R abdomen/bandaged. Extremities: No LE edema Dialysis Access: PD cath, as above.  Additional Objective Labs: Basic Metabolic Panel: Recent Labs  Lab 01/26/21 0334 01/27/21 0323 01/28/21 0417  NA 128* 128* 132*  K 5.5* 4.4 4.4  CL 91* 91* 93*  CO2 22 22 22   GLUCOSE 80 73 71  BUN 69* 69* 63*  CREATININE 7.07* 7.25* 7.21*  CALCIUM 7.2* 7.2* 7.2*  PHOS 3.8 4.2 5.0*   Liver Function Tests: Recent Labs  Lab 01/26/21 0334 01/27/21 0323 01/28/21 0417  ALBUMIN 1.4* 1.4* 1.3*   CBC: Recent Labs  Lab 01/27/21 0323 01/28/21 0417 01/29/21 0403 01/30/21 0338  WBC 9.0 9.9 8.8 9.3  HGB 5.8* 8.3* 8.5* 8.6*  HCT 18.5* 25.2* 26.1* 27.0*  MCV 103.4* 95.1 93.9 95.1  PLT 359 348 357 345   Blood Culture    Component Value Date/Time   SDES BLOOD LEFT HAND 01/30/2021 1515   SPECREQUEST  01/30/2021 1515    BOTTLES DRAWN AEROBIC ONLY Blood Culture adequate volume   CULT  01/30/2021 1515    NO GROWTH 2 DAYS Performed at [facility] [address] [place], [place] [address]    REPTSTATUS PENDING 01/30/2021 1515   Medications: . sodium chloride Stopped (01/31/21 1804)  .  ceFAZolin (ANCEF) IV 100 mL/hr at 01/31/21 1807  . dialysis solution 1.5% low-MG/low-CA     .  (feeding supplement) PROSource Plus  30 mL Oral BID BM  . acetaminophen  1,000 mg Oral TID  . benztropine  1 mg Oral QHS  . Chlorhexidine Gluconate Cloth  6 each Topical Daily  . cinacalcet  30 mg Oral Q supper  . darbepoetin (ARANESP) injection - NON-DIALYSIS  100 mcg Subcutaneous Q Mon-1800  . diclofenac Sodium  2 g Topical QID  . divalproex  750 mg Oral BID  . docusate sodium  100 mg Oral BID  . feeding supplement (NEPRO CARB STEADY)  237 mL Oral TID AC  . FLUoxetine  20 mg Oral Daily  . gentamicin cream  1 application Topical Daily  . dianeal solution for CAPD/CCPD with heparin   Peritoneal Dialysis Q24H  . iron polysaccharides  150 mg Oral QODAY  . levETIRAcetam  500 mg Oral BID  . levothyroxine  50 mcg Oral Q breakfast  . lidocaine  1 patch Transdermal Daily  . losartan  25 mg Oral Daily  . multivitamin  1 tablet Oral Daily  . pantoprazole  40 mg Oral Daily  . risperiDONE  2 mg Oral QHS  . sevelamer carbonate  800 mg Oral BID WC  . sevelamer carbonate  800 mg Oral With snacks  . vitamin B-12  500 mcg  Oral Daily    Dialysis Orders: CCPD - followed by [facility] (Dr. Nancy Marus) 4 exchanges over night, 2L each. Dry wt 71.5kg  Assessment/ Plan: 1. Discitis/ osteomyelitis/ epidural abscess: S/p L5- S1 laminectomy + epidural abscess evacuation on 01/25/21.BCx  4/5 1 of 2 with + GPC, wound Cx grew Staph Epi. IR consulted for tunneled central line for IV abx - to place today. Will be on Cefazolin thru 5/17 (6 wks per ID). 2. ESRD: On CCPD - 4 exchanges, 2L volume. PD nightly while here - needs heparin to all fluids due to visible fibrin. 3. HTN/volume: BP borderline high, but no edema and decent UF last night. Using 1.5% PD fluids - currently at EDW. Does not appear overloaded. Continue Losartan 25mg  QD. 4. Schizophrenia/Bipolar: Per primary. 5. Seizure disorder: Per primary. 6. Secondary HPTH: CorrCa and Phos ok - continue sevelamer + sensipar. 7. Anemia of ESRD: Hgb 8.2->  5.8 post-op. S/p 2U PRBCs on 4/7. Hgb 8.6. Aranesp started weekly. 8. Nutrition: Albumin very low, continue protein supplements. 9. R shoulder pain: For IR aspiration today, per notes.   Veneta Penton, PA-C 02/01/2021, 8:53 AM  [employer]

## 2021-02-01 NOTE — Progress Notes (Addendum)
   Providing Compassionate, Quality Care - Together   Subjective: Patient denies back pain.  Objective: Vital signs in last 24 hours: Temp:  [97.4 F (36.3 C)-98.4 F (36.9 C)] 98 F (36.7 C) (04/12 0904) Pulse Rate:  [90-95] 91 (04/12 0904) Resp:  [17-20] 17 (04/12 0904) BP: (127-150)/(74-87) 127/87 (04/12 0904) SpO2:  [95 %-99 %] 99 % (04/12 0904)  Intake/Output from previous day: 04/11 0701 - 04/12 0700 In: 2471.9 [I.V.:2417.6; IV Piggyback:54.4] Out: -  Intake/Output this shift: No intake/output data recorded.  Alert PERRLA MAE, Strengthappears intact Incision is covered with Honeycomb dressing and Steri Strips; Dressing is clean, dry, and intact.  Lab Results: Recent Labs    01/30/21 0338  WBC 9.3  HGB 8.6*  HCT 27.0*  PLT 345   BMET No results for input(s): NA, K, CL, CO2, GLUCOSE, BUN, CREATININE, CALCIUM in the last 72 hours.  Studies/Results: No results found.  Assessment/Plan: Patient underwent L5-S1 decompressive laminectomy with evacuation of epidural abscess on 01/25/2021 by Dr. Annette Stable. Right shoulder effusion noted on x-ray. Ortho ordered an IR guided tap to be performed under sedation.   LOS: 7 days    -Fine to leave incision open to air if not draining. Cover with gauze if any drainage noted and change as needed to keep dry -Continue to mobilize as tolerated. -Pain control. -ABX per ID. Started on cefazolin 01/27/2021. Will need 6 weeks abx therapy(end date 03/09/2021). -Recommendation is for SNF at discharge  Viona Gilmore, [place], AGNP-C Nurse Practitioner  [facility] [address], [address], [place], [place] [address] P: [phone]    F: [phone]  02/01/2021, 11:19 AM

## 2021-02-01 NOTE — Progress Notes (Signed)
This nurse notified patient's nurse that IJ was placed by radiology and will need to consult radiology for line removal. Fran Lowes, RN VAST

## 2021-02-01 NOTE — Procedures (Signed)
CLINICAL DATA: Right shoulder pain, demineralization along the joint surface on radiography concerning for possible septic joint.  EXAM: RIGHT SHOULDER ASPIRATION UNDER FLUOROSCOPY PROCEDURE   I discussed the risks (including hemorrhage and infection, among others), benefits, and alternatives to the procedure with the patient's father, as the patient was not awake, aware, or alert.  We specifically discussed the high technical likelihood of success of the procedure.  The patient's father understood and elected for the patient to undergo the procedure.    Standard time-out was employed.  Following sterile skin prep and local anesthetic administration consisting of 1% lidocaine, an 18 gauge needle was advanced without difficulty into the right glenohumeral joint under fluoroscopic guidance.  A total of 14 cc of serosanguineous synovial fluid was removed and sent to the lab.  The needle was subsequently removed and the skin cleansed and bandaged.  No immediate complications were observed.    FLUOROSCOPY TIME: Fluoroscopy Time:  0 minutes, 24 seconds Radiation Exposure Index (if provided by the fluoroscopic device):  0.9 mGy Number of Acquired Spot Images: 0  IMPRESSION: Technically successful right glenohumeral joint aspiration under fluoroscopy.  The 14 cc sample of joint fluid was sent to the laboratory.

## 2021-02-01 NOTE — Progress Notes (Signed)
Pt's family wishes to not wake pt to place on cpap at this time.  RT will continue to monitor.

## 2021-02-01 NOTE — Progress Notes (Addendum)
[facility] for Infectious Disease    Date of Admission:  01/25/2021   Total days of antibiotics 9           ID: Jane [name] is a 55 y.o. female with   Principal Problem:   Discitis of lumbosacral region Active Problems:   Schizoaffective disorder (HCC)   RETARDATION, MENTAL NOS   Essential hypertension   End-stage renal disease on peritoneal dialysis ([place])   Seizure disorder (HCC)   Pressure injury of skin    Subjective: Tearful this morning, shoulder pain,  Medications:  . (feeding supplement) PROSource Plus  30 mL Oral BID BM  . acetaminophen  1,000 mg Oral TID  . benztropine  1 mg Oral QHS  . Chlorhexidine Gluconate Cloth  6 each Topical Daily  . cinacalcet  30 mg Oral Q supper  . darbepoetin (ARANESP) injection - NON-DIALYSIS  100 mcg Subcutaneous Q Mon-1800  . diclofenac Sodium  2 g Topical QID  . divalproex  750 mg Oral BID  . docusate sodium  100 mg Oral BID  . feeding supplement (NEPRO CARB STEADY)  237 mL Oral TID AC  . FLUoxetine  20 mg Oral Daily  . gentamicin cream  1 application Topical Daily  . dianeal solution for CAPD/CCPD with heparin   Peritoneal Dialysis Q24H  . heparin sodium (porcine)      . iron polysaccharides  150 mg Oral QODAY  . levETIRAcetam  500 mg Oral BID  . levothyroxine  50 mcg Oral Q breakfast  . lidocaine  1 patch Transdermal Daily  . lidocaine      . losartan  25 mg Oral Daily  . multivitamin  1 tablet Oral Daily  . pantoprazole  40 mg Oral Daily  . risperiDONE  2 mg Oral QHS  . sevelamer carbonate  800 mg Oral BID WC  . sevelamer carbonate  800 mg Oral With snacks  . vitamin B-12  500 mcg Oral Daily    Objective: Vital signs in last 24 hours: Temp:  [97.4 F (36.3 C)-98.4 F (36.9 C)] 98 F (36.7 C) (04/12 0904) Pulse Rate:  [90-95] 91 (04/12 0904) Resp:  [17-20] 17 (04/12 0904) BP: (127-150)/(74-87) 127/87 (04/12 0904) SpO2:  [95 %-99 %] 99 % (04/12 0904)  Physical Exam  Constitutional:  oriented to  person, place, and time. appears well-developed and well-nourished. No distress.  HENT: [place]/AT, PERRLA, no scleral icterus Mouth/Throat: Oropharynx is clear and moist. No oropharyngeal exudate.  Cardiovascular: Normal rate, regular rhythm and normal heart sounds. Exam reveals no gallop and no friction rub.  No murmur heard.  Pulmonary/Chest: Effort normal and breath sounds normal. No respiratory distress.  has no wheezes.  Neck = supple, no nuchal rigidity, IJ in place Abdominal: Soft. Bowel sounds are normal.  exhibits no distension. There is no tenderness.  MSK= guards her right shoulder. Lower extremity weakness at baseline - needed help moving legs Neurological: alert and oriented to person, place, and time.  Skin: Skin is warm and dry. No rash noted. No erythema.  Psychiatric: a normal mood and affect.  behavior is normal.    Lab Results Recent Labs    01/30/21 0338  WBC 9.3  HGB 8.6*  HCT 27.0*   Liver Panel No results for input(s): PROT, ALBUMIN, AST, ALT, ALKPHOS, BILITOT, BILIDIR, IBILI in the last 72 hours. Sedimentation Rate No results for input(s): ESRSEDRATE in the last 72 hours. C-Reactive Protein No results for input(s): CRP in the last 72  hours.  Microbiology: 4/10 disc space =   Staphylococcus epidermidis    MIC    CIPROFLOXACIN <=0.5 SENSI... Sensitive    CLINDAMYCIN <=0.25 SENS... Sensitive    ERYTHROMYCIN >=8 RESISTANT  Resistant    GENTAMICIN <=0.5 SENSI... Sensitive    Inducible Clindamycin NEGATIVE  Sensitive    OXACILLIN <=0.25 SENS... Sensitive    RIFAMPIN <=0.5 SENSI... Sensitive    TETRACYCLINE <=1 SENSITIVE  Sensitive    TRIMETH/SULFA <=10 SENSIT... Sensitive    VANCOMYCIN 1 SENSITIVE  Sensitive    4/5 blood cx 1 of 4 bottles microccous Studies/Results: DG FLUORO GUIDED NEEDLE PLC ASPIRATION/INJECTION LOC  Result Date: 02/01/2021 CLINICAL DATA:  Right shoulder pain, demineralization along the joint surface is on radiography concerning for  possible septic joint. EXAM: RIGHT SHOULDER ASPIRATION UNDER FLUOROSCOPY PROCEDURE: PROCEDURE I discussed the risks (including hemorrhage and infection, among others), benefits, and alternatives to the procedure with the patient's father, as the patient was not awake, aware, or alert. We specifically discussed the high technical likelihood of success of the procedure. The patient's father understood and elected for the patient to undergo the procedure. Standard time-out was employed. Following sterile skin prep and local anesthetic administration consisting of 1% lidocaine, an 18 gauge needle was advanced without difficulty into the right glenohumeral joint under fluoroscopic guidance. A total of 14 cc of serosanguineous synovial fluid was removed and sent to the lab. The needle was subsequently removed and the skin cleansed and bandaged. No immediate complications were observed. FLUOROSCOPY TIME:  Fluoroscopy Time:  0 minutes, 24 seconds Radiation Exposure Index (if provided by the fluoroscopic device): 0.9 mGy Number of Acquired Spot Images: 0 IMPRESSION: Technically successful right glenohumeral joint aspiration under fluoroscopy. The 14 cc sample of joint fluid was sent to the laboratory. Electronically Signed   By: Van Clines M.D.   On: 02/01/2021 13:16     Assessment/Plan: Presumed right shoulder septic arthritis vs arthritis = to undergo ir aspiration today under sedation  Lumbar discitis with MSSE = continue on cefazolin  ESRD on PD = defer to nephrology how to proceed forward since she will need long term antibiotics  Schizoaffective disorder/emotional lability = appears to be more settled, less tearful when family is present.  indeterminant QTF= asymptomatic, recommend repeat in the morning  [facility] for Infectious Diseases Cell: [phone] Pager: [phone]  02/01/2021, 1:31 PM

## 2021-02-01 NOTE — Progress Notes (Signed)
Progress Note    Jane [name]  JJK:[id number] DOB: Apr 16, 1966  DOA: 01/25/2021 PCP: [name], MD    Brief Narrative:     Medical records reviewed and are as summarized below:  Jane [name] is an 55 y.o. female with medical history significant of intellectual disability; seizure d/o; schizophrenia/bipolar; hypothyroidism; HTN; HLD; and ESRD on PD presenting with back pain.  Found to have epidural abscess when taken to the OR by neurosurgery.  Right shoulder effusion.  S/p joint tap and tunneled PICC so she can get IV abx at home   Assessment/Plan:   Principal Problem:   Discitis of lumbosacral region Active Problems:   Schizoaffective disorder (HCC)   RETARDATION, MENTAL NOS   Essential hypertension   End-stage renal disease on peritoneal dialysis (HCC)   Seizure disorder (HCC)   Pressure injury of skin    Epidural abscess/Discitis- MSSE -Patient with progressive back pain, decreased rectal tone, increased difficulty ambulating with increasing falls -MRI showed severe discitis-osteomyelitis at L5-S1 with multifocal epidural abscesses -Neurosurgery consulted and took patient to the OR urgently for decompressive laminectomy and abscess evacuation -quantiferon gold repeated -ID consult to help manage abx appreciated: Continue Cefazolin through 5/17 -getting IJ PICC Line via IR prior to d/c per nephrology -blood culture + after 5 days-- repeat negative  ? Right shoulder effusion -ortho consult -IR to tap joint: cell count plus culture  ESRD on PD -Patient on chronic PD -Nephrology is consulting and will order PD -Continue Sensipar, Renvela  mental health issues -Uncertain baseline level of function -Continue home meds - Cogentin, Prozac, Risperdal -periods of agitation: will ask family to stay and will do low dose ativan IV  Seizure d/o -Continue Depakote (dose adjusted), Keppra  Hypothyroidism -Continue Synthroid at current dose as TSH  normal  HTN -Continue Cozaar  Anemia of CKD -s/p 2 units   Family Communication/Anticipated D/C date and plan/Code Status   DVT prophylaxis: Lovenox ordered. Code Status: Full Code.  Disposition Plan: Status is: Inpatient Called family, no answer Remains inpatient appropriate because:Inpatient level of care appropriate due to severity of illness   Dispo: The patient is from: Home              Anticipated d/c is to: tbd home ?              Patient currently is not medically stable to d/c. 24-48 hours pending cell count and culture   Difficult to place patient yes         Medical Consultants:    ID  NS.     Subjective:  Sleepy from sedation for procedures  Objective:    Vitals:   01/31/21 2130 02/01/21 0500 02/01/21 0837 02/01/21 0904  BP: 140/74 (!) 150/75 (!) 146/74 127/87  Pulse: 95 92 92 91  Resp: 18 20 18 17   Temp: 98.3 F (36.8 C) 97.8 F (36.6 C) 98.4 F (36.9 C) 98 F (36.7 C)  TempSrc: Axillary Axillary Oral Oral  SpO2: 98% 95% 98% 99%  Weight:      Height:        Intake/Output Summary (Last 24 hours) at 02/01/2021 1309 Last data filed at 02/01/2021 0900 Gross per 24 hour  Intake 469.04 ml  Output --  Net 469.04 ml   Filed Weights   01/28/21 0650 01/30/21 0655 01/31/21 0739  Weight: 70.6 kg 70.6 kg 71.3 kg    Exam:  General: Appearance:     Overweight female snoring  Lungs:     Clear to auscultation bilaterally, respirations unlabored  Heart:    Normal heart rate. Normal rhythm. No murmurs, rubs, or gallops.   MS:   All extremities are intact.   Neurologic:   snoring       Data Reviewed:   I have personally reviewed following labs and imaging studies:  Labs: Labs show the following:   Basic Metabolic Panel: Recent Labs  Lab 01/26/21 0334 01/27/21 0323 01/28/21 0417  NA 128* 128* 132*  K 5.5* 4.4 4.4  CL 91* 91* 93*  CO2 22 22 22   GLUCOSE 80 73 71  BUN 69* 69* 63*  CREATININE 7.07* 7.25* 7.21*   CALCIUM 7.2* 7.2* 7.2*  PHOS 3.8 4.2 5.0*   GFR Estimated Creatinine Clearance: 9 mL/min (A) (by C-G formula based on SCr of 7.21 mg/dL (H)). Liver Function Tests: Recent Labs  Lab 01/26/21 0334 01/27/21 0323 01/28/21 0417  ALBUMIN 1.4* 1.4* 1.3*   No results for input(s): LIPASE, AMYLASE in the last 168 hours. No results for input(s): AMMONIA in the last 168 hours. Coagulation profile No results for input(s): INR, PROTIME in the last 168 hours.  CBC: Recent Labs  Lab 01/27/21 0323 01/28/21 0417 01/29/21 0403 01/30/21 0338  WBC 9.0 9.9 8.8 9.3  HGB 5.8* 8.3* 8.5* 8.6*  HCT 18.5* 25.2* 26.1* 27.0*  MCV 103.4* 95.1 93.9 95.1  PLT 359 348 357 345   Cardiac Enzymes: No results for input(s): CKTOTAL, CKMB, CKMBINDEX, TROPONINI in the last 168 hours. BNP (last 3 results) No results for input(s): PROBNP in the last 8760 hours. CBG: No results for input(s): GLUCAP in the last 168 hours. D-Dimer: No results for input(s): DDIMER in the last 72 hours. Hgb A1c: No results for input(s): HGBA1C in the last 72 hours. Lipid Profile: No results for input(s): CHOL, HDL, LDLCALC, TRIG, CHOLHDL, LDLDIRECT in the last 72 hours. Thyroid function studies: No results for input(s): TSH, T4TOTAL, T3FREE, THYROIDAB in the last 72 hours.  Invalid input(s): FREET3 Anemia work up: No results for input(s): VITAMINB12, FOLATE, FERRITIN, TIBC, IRON, RETICCTPCT in the last 72 hours. Sepsis Labs: Recent Labs  Lab 01/27/21 0323 01/28/21 0417 01/29/21 0403 01/30/21 0338  WBC 9.0 9.9 8.8 9.3    Microbiology Recent Results (from the past 240 hour(s))  Blood culture (routine x 2)     Status: None   Collection Time: 01/25/21  4:37 AM   Specimen: BLOOD  Result Value Ref Range Status   Specimen Description BLOOD RIGHT ANTECUBITAL  Final   Special Requests   Final    BOTTLES DRAWN AEROBIC AND ANAEROBIC Blood Culture adequate volume   Culture   Final    NO GROWTH 5 DAYS Performed at [facility], [address] [place], [place] [address]    Report Status 01/30/2021 FINAL  Final  Resp Panel by RT-PCR (Flu A&B, Covid) Nasopharyngeal Swab     Status: None   Collection Time: 01/25/21  4:37 AM   Specimen: Nasopharyngeal Swab; Nasopharyngeal(NP) swabs in vial transport medium  Result Value Ref Range Status   SARS Coronavirus 2 by RT PCR NEGATIVE NEGATIVE Final    Comment: (NOTE) SARS-CoV-2 target nucleic acids are NOT DETECTED.  The SARS-CoV-2 RNA is generally detectable in upper respiratory specimens during the acute phase of infection. The lowest concentration of SARS-CoV-2 viral copies this assay can detect is 138 copies/mL. A negative result does not preclude SARS-Cov-2 infection and should not be used as the sole  basis for treatment or other patient management decisions. A negative result may occur with  improper specimen collection/handling, submission of specimen other than nasopharyngeal swab, presence of viral mutation(s) within the areas targeted by this assay, and inadequate number of viral copies(<138 copies/mL). A negative result must be combined with clinical observations, patient history, and epidemiological information. The expected result is Negative.  Fact Sheet for Patients:  EntrepreneurPulse.com.au  Fact Sheet for Healthcare Providers:  IncredibleEmployment.be  This test is no t yet approved or cleared by the [place] FDA and  has been authorized for detection and/or diagnosis of SARS-CoV-2 by FDA under an Emergency Use Authorization (EUA). This EUA will remain  in effect (meaning this test can be used) for the duration of the COVID-19 declaration under Section 564(b)(1) of the Act, 21 U.S.C.section 360bbb-3(b)(1), unless the authorization is terminated  or revoked sooner.       Influenza A by PCR NEGATIVE NEGATIVE Final   Influenza B by PCR NEGATIVE NEGATIVE Final    Comment: (NOTE) The Xpert  Xpress SARS-CoV-2/FLU/RSV plus assay is intended as an aid in the diagnosis of influenza from Nasopharyngeal swab specimens and should not be used as a sole basis for treatment. Nasal washings and aspirates are unacceptable for Xpert Xpress SARS-CoV-2/FLU/RSV testing.  Fact Sheet for Patients: EntrepreneurPulse.com.au  Fact Sheet for Healthcare Providers: IncredibleEmployment.be  This test is not yet approved or cleared by the [place] FDA and has been authorized for detection and/or diagnosis of SARS-CoV-2 by FDA under an Emergency Use Authorization (EUA). This EUA will remain in effect (meaning this test can be used) for the duration of the COVID-19 declaration under Section 564(b)(1) of the Act, 21 U.S.C. section 360bbb-3(b)(1), unless the authorization is terminated or revoked.  Performed at [facility], [place] [address] [place], [place] [address]   Blood culture (routine x 2)     Status: Abnormal   Collection Time: 01/25/21  4:38 AM   Specimen: BLOOD RIGHT HAND  Result Value Ref Range Status   Specimen Description BLOOD RIGHT HAND  Final   Special Requests   Final    BOTTLES DRAWN AEROBIC AND ANAEROBIC Blood Culture adequate volume   Culture  Setup Time   Final    GRAM POSITIVE COCCI AEROBIC BOTTLE ONLY CRITICAL RESULT CALLED TO, READ BACK BY AND VERIFIED WITH: PAM ROGERS, RN 01/30/2021 @0132  A.HUGHES    Culture (A)  Final    MICROCOCCUS LUTEUS/LYLAE THE SIGNIFICANCE OF ISOLATING THIS ORGANISM FROM A SINGLE SET OF BLOOD CULTURES WHEN MULTIPLE SETS ARE DRAWN IS UNCERTAIN. PLEASE NOTIFY THE MICROBIOLOGY DEPARTMENT WITHIN ONE WEEK IF SPECIATION AND SENSITIVITIES ARE REQUIRED. Performed at [facility], [place] [address], [place], [place] [address]    Report Status 01/31/2021 FINAL  Final  Blood Culture ID Panel (Reflexed)     Status: None   Collection Time: 01/25/21  4:38 AM  Result Value Ref Range Status   Enterococcus  faecalis NOT DETECTED NOT DETECTED Corrected   Enterococcus Faecium NOT DETECTED NOT DETECTED Corrected   Listeria monocytogenes NOT DETECTED NOT DETECTED Corrected   Staphylococcus species NOT DETECTED NOT DETECTED Corrected   Staphylococcus aureus (BCID) NOT DETECTED NOT DETECTED Corrected   Staphylococcus epidermidis NOT DETECTED NOT DETECTED Corrected   Staphylococcus lugdunensis NOT DETECTED NOT DETECTED Corrected   Streptococcus species NOT DETECTED NOT DETECTED Corrected   Streptococcus agalactiae NOT DETECTED NOT DETECTED Corrected   Streptococcus pneumoniae NOT DETECTED NOT DETECTED Corrected   Streptococcus pyogenes NOT  DETECTED NOT DETECTED Corrected   A.calcoaceticus-baumannii NOT DETECTED NOT DETECTED Corrected   Bacteroides fragilis NOT DETECTED NOT DETECTED Corrected   Enterobacterales NOT DETECTED NOT DETECTED Corrected   Enterobacter cloacae complex NOT DETECTED NOT DETECTED Corrected   Escherichia coli NOT DETECTED NOT DETECTED Corrected   Klebsiella aerogenes NOT DETECTED NOT DETECTED Corrected   Klebsiella oxytoca NOT DETECTED NOT DETECTED Corrected   Klebsiella pneumoniae NOT DETECTED NOT DETECTED Corrected   Proteus species NOT DETECTED NOT DETECTED Corrected   Salmonella species NOT DETECTED NOT DETECTED Corrected   Serratia marcescens NOT DETECTED NOT DETECTED Corrected   Haemophilus influenzae NOT DETECTED NOT DETECTED Corrected   Neisseria meningitidis NOT DETECTED NOT DETECTED Corrected   Pseudomonas aeruginosa NOT DETECTED NOT DETECTED Corrected   Stenotrophomonas maltophilia NOT DETECTED NOT DETECTED Corrected   Candida albicans NOT DETECTED NOT DETECTED Corrected   Candida auris NOT DETECTED NOT DETECTED Corrected   Candida glabrata NOT DETECTED NOT DETECTED Corrected   Candida krusei NOT DETECTED NOT DETECTED Corrected   Candida parapsilosis NOT DETECTED NOT DETECTED Corrected   Candida tropicalis NOT DETECTED NOT DETECTED Corrected   Cryptococcus  neoformans/gattii NOT DETECTED NOT DETECTED Corrected    Comment: Performed at [facility], [address], [place], [place] [address]  Aerobic/Anaerobic Culture w Gram Stain (surgical/deep wound)     Status: None   Collection Time: 01/25/21  8:08 AM   Specimen: PATH Other; Body Fluid  Result Value Ref Range Status   Specimen Description ABSCESS  Final   Special Requests EPIDUAL ABSCESS SPEC A  Final   Gram Stain NO WBC SEEN NO ORGANISMS SEEN   Final   Culture   Final    RARE STAPHYLOCOCCUS EPIDERMIDIS NO ANAEROBES ISOLATED Performed at [facility], [address] [place], [place] [address]    Report Status 01/30/2021 FINAL  Final   Organism ID, Bacteria STAPHYLOCOCCUS EPIDERMIDIS  Final      Susceptibility   Staphylococcus epidermidis - MIC*    CIPROFLOXACIN <=0.5 SENSITIVE Sensitive     ERYTHROMYCIN >=8 RESISTANT Resistant     GENTAMICIN <=0.5 SENSITIVE Sensitive     OXACILLIN <=0.25 SENSITIVE Sensitive     TETRACYCLINE <=1 SENSITIVE Sensitive     VANCOMYCIN 1 SENSITIVE Sensitive     TRIMETH/SULFA <=10 SENSITIVE Sensitive     CLINDAMYCIN <=0.25 SENSITIVE Sensitive     RIFAMPIN <=0.5 SENSITIVE Sensitive     Inducible Clindamycin NEGATIVE Sensitive     * RARE STAPHYLOCOCCUS EPIDERMIDIS  Aerobic Culture w Gram Stain (superficial specimen)     Status: None   Collection Time: 01/25/21  8:14 AM   Specimen: Wound  Result Value Ref Range Status   Specimen Description WOUND  Final   Special Requests ABSC  Final   Gram Stain   Final    RARE WBC PRESENT,BOTH PMN AND MONONUCLEAR RARE GRAM POSITIVE COCCI IN PAIRS Performed at [facility], [address], [place], [place] [address]    Culture FEW STAPHYLOCOCCUS EPIDERMIDIS  Final   Report Status 01/27/2021 FINAL  Final   Organism ID, Bacteria STAPHYLOCOCCUS EPIDERMIDIS  Final      Susceptibility   Staphylococcus epidermidis - MIC*    CIPROFLOXACIN <=0.5 SENSITIVE Sensitive     ERYTHROMYCIN >=8 RESISTANT  Resistant     GENTAMICIN <=0.5 SENSITIVE Sensitive     OXACILLIN <=0.25 SENSITIVE Sensitive     TETRACYCLINE <=1 SENSITIVE Sensitive     VANCOMYCIN 1 SENSITIVE Sensitive  TRIMETH/SULFA <=10 SENSITIVE Sensitive     CLINDAMYCIN <=0.25 SENSITIVE Sensitive     RIFAMPIN <=0.5 SENSITIVE Sensitive     Inducible Clindamycin NEGATIVE Sensitive     * FEW STAPHYLOCOCCUS EPIDERMIDIS  Culture, blood (Routine X 2) w Reflex to ID Panel     Status: None (Preliminary result)   Collection Time: 01/30/21  3:15 PM   Specimen: BLOOD LEFT HAND  Result Value Ref Range Status   Specimen Description BLOOD LEFT HAND  Final   Special Requests   Final    BOTTLES DRAWN AEROBIC ONLY Blood Culture adequate volume   Culture   Final    NO GROWTH 2 DAYS Performed at [facility], [place] [address], [place], [place] [address]    Report Status PENDING  Incomplete    Procedures and diagnostic studies:  No results found.  Medications:   . (feeding supplement) PROSource Plus  30 mL Oral BID BM  . acetaminophen  1,000 mg Oral TID  . benztropine  1 mg Oral QHS  . Chlorhexidine Gluconate Cloth  6 each Topical Daily  . cinacalcet  30 mg Oral Q supper  . darbepoetin (ARANESP) injection - NON-DIALYSIS  100 mcg Subcutaneous Q Mon-1800  . diclofenac Sodium  2 g Topical QID  . divalproex  750 mg Oral BID  . docusate sodium  100 mg Oral BID  . feeding supplement (NEPRO CARB STEADY)  237 mL Oral TID AC  . FLUoxetine  20 mg Oral Daily  . gentamicin cream  1 application Topical Daily  . dianeal solution for CAPD/CCPD with heparin   Peritoneal Dialysis Q24H  . heparin sodium (porcine)      . iron polysaccharides  150 mg Oral QODAY  . levETIRAcetam  500 mg Oral BID  . levothyroxine  50 mcg Oral Q breakfast  . lidocaine  1 patch Transdermal Daily  . lidocaine      . losartan  25 mg Oral Daily  . multivitamin  1 tablet Oral Daily  . pantoprazole  40 mg Oral Daily  . risperiDONE  2 mg Oral QHS  . sevelamer  carbonate  800 mg Oral BID WC  . sevelamer carbonate  800 mg Oral With snacks  . vitamin B-12  500 mcg Oral Daily   Continuous Infusions: .  ceFAZolin (ANCEF) IV 100 mL/hr at 01/31/21 1807  . dialysis solution 1.5% low-MG/low-CA       LOS: 7 days   Geradine Girt  [facility]   How to contact the [facility] Attending or Consulting provider [place] or covering provider during after hours [place], for this patient?  1. Check the care team in [facility] and look for a) attending/consulting TR[facility] provider listed and b) the [facility] team listed 2. Log into [email or web address] and use [place]'s universal password to access. If you do not have the password, please contact the hospital operator. 3. Locate the [facility] provider you are looking for under [facility] and page to a number that you can be directly reached. 4. If you still have difficulty reaching the provider, please page the [facility] (Director on Call) for the Hospitalists listed on amion for assistance.  02/01/2021, 1:09 PM

## 2021-02-01 NOTE — Progress Notes (Signed)
Physical Therapy Treatment Patient Details Name: Jane [name] MRN: [id number] DOB: Dec 24, 1965 Today's Date: 02/01/2021    History of Present Illness The patient is a 55 y.o. year-old w/ hx of schizophrenia, seizure d/o, ESRD on PD, mental retardation (moderate severity), HTN, HL, hypothyroid, bipolar d/o, anemia presented yest to ED w/ abd pain, back pain and leg pain for 1 month. Pt is on home peritoneal dialysis for the last 6 yrs, f/[place]rology [place] W/U showed osteo/ discitis at L5-S1 w/ destructive changes of the anterior superior endplate of S1. Also large lateral epidural abscess causing significant compression of the thecal sac. Pt was taken to surgery for L5-S1 laminectomy and evacuation of epidural abscess.  Postop pt is on 5 N. Pt lives with sister who provides 24/7 care.    PT Comments    Pt supine in bed on arrival this session.  She continues to cry out due to pain and baseline behaviors.  Able to redirect patient to counting reps of exercises or seconds in standing.  Pt contines to require +2 assistance but able to achieve erect standing this session.  Continue to recommend snf placement.    Follow Up Recommendations  SNF;Supervision/Assistance - 24 hour     Equipment Recommendations  Wheelchair cushion (measurements PT);Wheelchair (measurements PT);Rolling walker with 5" wheels;3in1 (PT)    Recommendations for Other Services       Precautions / Restrictions Precautions Precautions: Back;Other (comment) Precaution Comments: spinal precautions Required Braces or Orthoses: Spinal Brace Spinal Brace: Lumbar corset;Applied in sitting position Restrictions Weight Bearing Restrictions: No Other Position/Activity Restrictions: No bending, lifting, twisting    Mobility  Bed Mobility Overal bed mobility: Needs Assistance Bed Mobility: Rolling;Sidelying to Sit;Sit to Sidelying Rolling: Mod assist;+2 for physical assistance Sidelying to sit: Max assist;+2 for physical  assistance     Sit to sidelying: Max assist General bed mobility comments: Cues for hand placement and assistance to move B LEs to edge of bed and elevate trunk into a seated position.  Pt remains to push posterior once in sitting.  For back to bed followed commands well for hand placement.  Step by step cues to maintain log rolling.    Transfers Overall transfer level: Needs assistance Equipment used: Ambulation equipment used (sara stedy) Transfers: Sit to/from Stand Sit to Stand: Max assist;+2 physical assistance;Mod assist         General transfer comment: Pt stood first trial in flexed position.  Placed stedy plates under her bottom for 2nd and third trials and she was able to come to a more erect stance for 3-5 sec in duration.  Facilitation to extend B hips.  Ambulation/Gait Ambulation/Gait assistance:  (NT continue to work on standing tolerance in stedy.)               Stairs             Wheelchair Mobility    Modified Rankin (Stroke Patients Only)       Balance Overall balance assessment: Needs assistance Sitting-balance support: Bilateral upper extremity supported Sitting balance-Leahy Scale: Poor       Standing balance-Leahy Scale: Poor                              Cognition Arousal/Alertness: Awake/alert Behavior During Therapy: Anxious (crying intermittently but able to re-direct.) Overall Cognitive Status: History of cognitive impairments - at baseline  General Comments: Easier to redirect this session, improvement in following commands.      Exercises General Exercises - Lower Extremity Heel Slides: AAROM;Both;10 reps;Supine Hip ABduction/ADduction: AAROM;Both;10 reps;Supine    General Comments        Pertinent Vitals/Pain Pain Assessment: Faces Faces Pain Scale: Hurts whole lot Pain Location: back and RUE Pain Descriptors / Indicators: Crying Pain Intervention(s):  Monitored during session;Repositioned    Home Living                      Prior Function            PT Goals (current goals can now be found in the care plan section) Acute Rehab PT Goals Patient Stated Goal: didn't state Potential to Achieve Goals: Fair Progress towards PT goals: Progressing toward goals    Frequency    Min 5X/week      PT Plan Current plan remains appropriate    Co-evaluation              AM-PAC PT "6 Clicks" Mobility   Outcome Measure  Help needed turning from your back to your side while in a flat bed without using bedrails?: A Lot Help needed moving from lying on your back to sitting on the side of a flat bed without using bedrails?: A Lot Help needed moving to and from a bed to a chair (including a wheelchair)?: A Lot Help needed standing up from a chair using your arms (e.g., wheelchair or bedside chair)?: A Lot Help needed to walk in hospital room?: Total Help needed climbing 3-5 steps with a railing? : Total 6 Click Score: 10    End of Session Equipment Utilized During Treatment: Gait belt Activity Tolerance: Patient limited by pain Patient left: in bed;with call bell/phone within reach;with bed alarm set Nurse Communication: Mobility status PT Visit Diagnosis: Unsteadiness on feet (R26.81);Pain;Other abnormalities of gait and mobility (R26.89);Muscle weakness (generalized) (M62.81) Pain - Right/Left: Right     Time: 1000-1014 PT Time Calculation (min) (ACUTE ONLY): 14 min  Charges:  $Therapeutic Activity: 8-22 mins                     Jane [name] , PTA Acute Rehabilitation Services Pager [phone] Office [phone]     Jane [name] Jane [name] 02/01/2021, 12:01 PM

## 2021-02-02 DIAGNOSIS — B9561 Methicillin susceptible Staphylococcus aureus infection as the cause of diseases classified elsewhere: Secondary | ICD-10-CM | POA: Diagnosis not present

## 2021-02-02 DIAGNOSIS — G40909 Epilepsy, unspecified, not intractable, without status epilepticus: Secondary | ICD-10-CM

## 2021-02-02 DIAGNOSIS — G062 Extradural and subdural abscess, unspecified: Secondary | ICD-10-CM | POA: Diagnosis not present

## 2021-02-02 DIAGNOSIS — L89159 Pressure ulcer of sacral region, unspecified stage: Secondary | ICD-10-CM

## 2021-02-02 DIAGNOSIS — M4647 Discitis, unspecified, lumbosacral region: Secondary | ICD-10-CM | POA: Diagnosis not present

## 2021-02-02 DIAGNOSIS — N186 End stage renal disease: Secondary | ICD-10-CM | POA: Diagnosis not present

## 2021-02-02 LAB — BASIC METABOLIC PANEL
Anion gap: 16 — ABNORMAL HIGH (ref 5–15)
BUN: 53 mg/dL — ABNORMAL HIGH (ref 6–20)
CO2: 22 mmol/L (ref 22–32)
Calcium: 7.3 mg/dL — ABNORMAL LOW (ref 8.9–10.3)
Chloride: 96 mmol/L — ABNORMAL LOW (ref 98–111)
Creatinine, Ser: 7.65 mg/dL — ABNORMAL HIGH (ref 0.44–1.00)
GFR, Estimated: 6 mL/min — ABNORMAL LOW (ref >60)
Glucose, Bld: 70 mg/dL (ref 70–99)
Potassium: 3.4 mmol/L — ABNORMAL LOW (ref 3.5–5.1)
Sodium: 134 mmol/L — ABNORMAL LOW (ref 135–145)

## 2021-02-02 LAB — CBC
HCT: 26.2 % — ABNORMAL LOW (ref 36.0–46.0)
Hemoglobin: 8.6 g/dL — ABNORMAL LOW (ref 12.0–15.0)
MCH: 31.3 pg (ref 26.0–34.0)
MCHC: 32.8 g/dL (ref 30.0–36.0)
MCV: 95.3 fL (ref 80.0–100.0)
Platelets: 361 10*3/uL (ref 150–400)
RBC: 2.75 MIL/uL — ABNORMAL LOW (ref 3.87–5.11)
RDW: 16.8 % — ABNORMAL HIGH (ref 11.5–15.5)
WBC: 7.4 10*3/uL (ref 4.0–10.5)
nRBC: 0 % (ref 0.0–0.2)

## 2021-02-02 MED ORDER — ENSURE ENLIVE PO LIQD
237.0000 mL | Freq: Two times a day (BID) | ORAL | Status: DC
  Administered 2021-02-03 – 2021-02-04 (×3): 237 mL via ORAL

## 2021-02-02 NOTE — Progress Notes (Signed)
Occupational Therapy Treatment Patient Details Name: Jane [name] MRN: [id number] DOB: Dec 10, 1965 Today's Date: 02/02/2021    History of present illness The patient is a 55 y.o. year-old w/ hx of schizophrenia, seizure d/o, ESRD on PD, mental retardation (moderate severity), HTN, HL, hypothyroid, bipolar d/o, anemia presented yest to ED w/ abd pain, back pain and leg pain for 1 month. Pt is on home peritoneal dialysis for the last 6 yrs, f[place] neph[place] W/U showed osteo/ discitis at L5-S1 w/ destructive changes of the anterior superior endplate of S1. Also large lateral epidural abscess causing significant compression of the thecal sac. Pt was taken to surgery for L5-S1 laminectomy and evacuation of epidural abscess.  Postop pt is on 5 N. Pt lives with sister who provides 24/7 care.   OT comments  Jane [name] continues to cry out during session, she is motivated by the end goal of "getting back to bed." Jane [name] continues to required max A +2 for all functional mobility, for this reason I recommend a hoyer lift for home to reduce caregiver stress/strain. While supported in the steady, Jane [name] completed grooming tasks with mod-max A and max vc for sequencing. Jane [name] would benefit from further practice with feeding/grooming while sitting EOB or supported in steady. D/c remains appropriate as sister if sister is able to support Jane [name]'s level of care. OT will continue following acutely to progress function in ADLs.    Follow Up Recommendations  Supervision/Assistance - 24 hour;Home health OT;Other (comment)    Equipment Recommendations  Other (comment);3 in 1 bedside commode (Hoyer lift)       Precautions / Restrictions Precautions Precautions: Back Precaution Booklet Issued: Yes (comment) Precaution Comments: spinal precautions (sister educated) Required Braces or Orthoses: Spinal Brace Spinal Brace: Lumbar corset Restrictions Weight Bearing Restrictions: No       Mobility Bed  Mobility Overal bed mobility: Needs Assistance Bed Mobility: Rolling;Sidelying to Sit;Sit to Sidelying Rolling: Max assist;+2 for physical assistance;+2 for safety/equipment Sidelying to sit: Max assist;+2 for physical assistance       General bed mobility comments: Cues for hand placement and assistance to move B LEs to edge of bed and elevate trunk into a seated position.  Pt remains to push posterior once in sitting.  Pt un-motivated fro transfers, unsure if this factors into true physical levels    Transfers Overall transfer level: Needs assistance Equipment used: Ambulation equipment used Transfers: Sit to/from Stand Sit to Stand: Max assist;+2 physical assistance;Mod assist         General transfer comment: Pt stood in flexe position at the hips, required vc and max physical A to "stand tall". Pt stood/sat with steadty for ~10 minutes    Balance Overall balance assessment: Needs assistance Sitting-balance support: Bilateral upper extremity supported Sitting balance-Leahy Scale: Poor Sitting balance - Comments: pushing posterior             ADL either performed or assessed with clinical judgement   ADL Overall ADL's : Needs assistance/impaired Eating/Feeding: Maximal assistance;Cueing for safety;Cueing for sequencing;Sitting   Grooming: Maximal assistance;Cueing for sequencing;Sitting   Upper Body Bathing: Maximal assistance;Cueing for safety;Cueing for sequencing;Sitting       Upper Body Dressing : Maximal assistance;Cueing for safety;Cueing for sequencing;Sitting       Toilet Transfer: Total assistance;+2 for physical assistance;+2 for safety/equipment;Cueing for safety;Cueing for sequencing;Stand-pivot           Functional mobility during ADLs: Total assistance;+2 for physical assistance;+2 for safety/equipment General ADL Comments: Pt completed brushing teeth  and grooming today while supported in the steady. pt requried max A for sequencing tasks and  phyiscal support               Cognition Arousal/Alertness: Awake/alert Behavior During Therapy: Agitated;Anxious Overall Cognitive Status: History of cognitive impairments - at baseline                 General Comments: Pt difficult to re-direct this session, motivated by "getting back to bed." pt does better when tasks are counted in steps, counting down to "getting back to bed"              General Comments pt with IV placement on R chest, incision site on lower back, and bandage on shoulder.    Pertinent Vitals/ Pain       Pain Assessment: Faces Pain Score: 8  Faces Pain Scale: Hurts whole lot Pain Location: back and RUE Pain Descriptors / Indicators: Discomfort;Grimacing;Crying Pain Intervention(s): Limited activity within patient's tolerance;Monitored during session         Frequency  Min 2X/week        Progress Toward Goals  OT Goals(current goals can now be found in the care plan section)  Progress towards OT goals: Progressing toward goals  Acute Rehab OT Goals Patient Stated Goal: didn't state OT Goal Formulation: Patient unable to participate in goal setting Time For Goal Achievement: 02/09/21 Potential to Achieve Goals: Fair ADL Goals Pt Will Perform Grooming: with min guard assist;sitting Pt Will Perform Lower Body Dressing: with mod assist;sit to/from stand Pt Will Transfer to Toilet: with mod assist;squat pivot transfer;bedside commode Additional ADL Goal #1: Pt will respond to step by step verbal commands to assist care giver in basic ADLs  Plan Discharge plan remains appropriate       AM-PAC OT "6 Clicks" Daily Activity     Outcome Measure   Help from another person eating meals?: A Little Help from another person taking care of personal grooming?: A Lot Help from another person toileting, which includes using toliet, bedpan, or urinal?: Total Help from another person bathing (including washing, rinsing, drying)?: A Lot Help from  another person to put on and taking off regular upper body clothing?: A Lot Help from another person to put on and taking off regular lower body clothing?: Total 6 Click Score: 11    End of Session Equipment Utilized During Treatment: Gait belt;Other (comment) (steady)  OT Visit Diagnosis: Unsteadiness on feet (R26.81);Other abnormalities of gait and mobility (R26.89);Repeated falls (R29.6);Muscle weakness (generalized) (M62.81);Cognitive communication deficit (R41.841);Pain Pain - Right/Left: Right Pain - part of body: Shoulder   Activity Tolerance Patient limited by pain;Treatment limited secondary to agitation   Patient Left in bed;with call bell/phone within reach;with bed alarm set       Time: 9244-6286 OT Time Calculation (min): 28 min  Charges: OT General Charges $OT Visit: 1 Visit OT Treatments $Self Care/Home Management : 23-37 mins     Jane [name] A Orvil Faraone 02/02/2021, 11:15 AM

## 2021-02-02 NOTE — Progress Notes (Addendum)
Physical Therapy Treatment Patient Details Name: Jane [name] MRN: [id number] DOB: Mar 06, 1966 Today's Date: 02/02/2021    History of Present Illness The patient is a 55 y.o. year-old w/ hx of schizophrenia, seizure d/o, ESRD on PD, mental retardation (moderate severity), HTN, HL, hypothyroid, bipolar d/o, anemia presented yest to ED w/ abd pain, back pain and leg pain for 1 month. Pt is on home peritoneal dialysis for the last 6 yrs, f/[place] neph[place] W/U showed osteo/ discitis at L5-S1 w/ destructive changes of the anterior superior endplate of S1. Also large lateral epidural abscess causing significant compression of the thecal sac. Pt was taken to surgery for L5-S1 laminectomy and evacuation of epidural abscess.  Postop pt is on 5 N. Pt lives with sister who provides 24/7 care. Right shoulder arthrocentesis on 4/12.    PT Comments    Pt was supine in bed upon arrival. She was difficult to arouse for PT. Pt seemed very lethargic and had an O2 reading of 88 which came back up to 94 after a few minutes. Her breathing seemed more labored than usual today as well. She does not seem to be making much progress towards her set goals and she could not be motivated to assist much with exercises in the bed as she kept falling asleep and was very resistant to doing STS in the stedy. Pt cried out during session no more than normal, but her behavior today seemed to be less behavioral and more pain related. Continue with POC as tolerated by the patient. SNF placement remains appropriate for this patient.    Follow Up Recommendations  SNF;Supervision/Assistance - 24 hour     Equipment Recommendations  Wheelchair cushion (measurements PT);Wheelchair (measurements PT);Rolling walker with 5" wheels;3in1 (PT)    Recommendations for Other Services       Precautions / Restrictions Precautions Precautions: Back Precaution Comments: spinal precautions Required Braces or Orthoses: Spinal Brace Spinal  Brace: Lumbar corset Restrictions Weight Bearing Restrictions: No    Mobility  Bed Mobility Overal bed mobility: Needs Assistance Bed Mobility: Rolling;Sidelying to Sit;Sit to Sidelying Rolling: Mod assist Sidelying to sit: Max assist;+2 for physical assistance     Sit to sidelying: Max assist;+2 for physical assistance General bed mobility comments: Cues for sequencing with log rolling and assistance to move LEs to EOB and elevate trunk into sitting posture and vice versa. Pt continued to push on tech when in sitting.    Transfers Overall transfer level: Needs assistance Equipment used: Ambulation equipment used Transfers: Sit to/from Stand Sit to Stand: Max assist;+2 physical assistance         General transfer comment: PTA was unsuccessful in getting pt to stand today. Pt managed to stand enough to be situated in the folding seat of the stedy, but after that could not be motivated to finish PT with the usual tactic of "do this many and you can get back in the bed".  Ambulation/Gait                 Stairs             Wheelchair Mobility    Modified Rankin (Stroke Patients Only)       Balance Overall balance assessment: Needs assistance Sitting-balance support: Bilateral upper extremity supported Sitting balance-Leahy Scale: Poor Sitting balance - Comments: Pushes posteriorly Postural control: Posterior lean   Standing balance-Leahy Scale: Poor  Cognition Arousal/Alertness: Lethargic Behavior During Therapy: Anxious;Restless Overall Cognitive Status: History of cognitive impairments - at baseline                                 General Comments: Pt was in and out of sleep through bed exercises and was unable to be re-directed today. Patient was not motivated by the typical "do this and we can get back to bed" statement. Multiple boughts of hollering/crying and unable to be consoled. Pt's  behavior seemed less behavioral and more like it was out of pain today.      Exercises General Exercises - Lower Extremity Heel Slides: AAROM;Both;10 reps;Supine Hip ABduction/ADduction: AAROM;Both;10 reps;Supine    General Comments        Pertinent Vitals/Pain Pain Assessment: Faces Faces Pain Scale: Hurts whole lot Pain Location: back and legs Pain Descriptors / Indicators: Discomfort;Grimacing;Crying Pain Intervention(s): Monitored during session;Repositioned;Limited activity within patient's tolerance    Home Living                      Prior Function            PT Goals (current goals can now be found in the care plan section) Acute Rehab PT Goals PT Goal Formulation: Patient unable to participate in goal setting Potential to Achieve Goals: Fair Progress towards PT goals: Not progressing toward goals - comment (Pt. limited today due to lethargy and pain with movement.)    Frequency    Min 5X/week      PT Plan Current plan remains appropriate    Co-evaluation              AM-PAC PT "6 Clicks" Mobility   Outcome Measure  Help needed turning from your back to your side while in a flat bed without using bedrails?: A Lot Help needed moving from lying on your back to sitting on the side of a flat bed without using bedrails?: A Lot Help needed moving to and from a bed to a chair (including a wheelchair)?: A Lot Help needed standing up from a chair using your arms (e.g., wheelchair or bedside chair)?: A Lot Help needed to walk in hospital room?: Total Help needed climbing 3-5 steps with a railing? : Total 6 Click Score: 10    End of Session   Activity Tolerance: Patient limited by pain;Patient limited by lethargy Patient left: in bed;with call bell/phone within reach;with bed alarm set Nurse Communication: Mobility status;Other (comment) (Nurse made aware of the unusual behavior by pt. She typically hollers, but is much easier to get to do  therapy.) PT Visit Diagnosis: Unsteadiness on feet (R26.81);Pain;Other abnormalities of gait and mobility (R26.89);Muscle weakness (generalized) (M62.81)     Time: 4496-7591 PT Time Calculation (min) (ACUTE ONLY): 29 min  Charges:  $Therapeutic Activity: 23-37 mins                      Kingsley Plan 02/02/2021, 3:53 PM

## 2021-02-02 NOTE — Progress Notes (Addendum)
[facility] Progress Note   Subjective:  Seen in room - crying that her legs hurt today. PD went well overnight, 675mL UF. No edema on exam. S/p shoulder aspiration, looks infected.  Objective Vitals:   02/01/21 1300 02/01/21 1808 02/01/21 2110 02/02/21 0640  BP: 130/80 (!) 151/80 (!) 143/93 140/74  Pulse: 91  99 89  Resp: 18 14 18 18   Temp: 98.4 F (36.9 C) (!) 97.4 F (36.3 C) (!) 97.5 F (36.4 C) 97.9 F (36.6 C)  TempSrc: Oral Axillary Oral Oral  SpO2: 99%  92% 99%  Weight:  70.7 kg    Height:       Physical Exam General:Chronically ill appearing woman, NAD. Heart:RRR; no murmur Lungs:CTA anteriorly Abdomen:soft, non-tender. PD cath in R abdomen/bandaged. Extremities:No LE edema Dialysis Access:PD cath, as above.  Additional Objective Labs: Basic Metabolic Panel: Recent Labs  Lab 01/27/21 0323 01/28/21 0417  NA 128* 132*  K 4.4 4.4  CL 91* 93*  CO2 22 22  GLUCOSE 73 71  BUN 69* 63*  CREATININE 7.25* 7.21*  CALCIUM 7.2* 7.2*  PHOS 4.2 5.0*   Liver Function Tests: Recent Labs  Lab 01/27/21 0323 01/28/21 0417  ALBUMIN 1.4* 1.3*   CBC: Recent Labs  Lab 01/27/21 0323 01/28/21 0417 01/29/21 0403 01/30/21 0338  WBC 9.0 9.9 8.8 9.3  HGB 5.8* 8.3* 8.5* 8.6*  HCT 18.5* 25.2* 26.1* 27.0*  MCV 103.4* 95.1 93.9 95.1  PLT 359 348 357 345   Studies/Results: IR Fluoro Guide CV Line Right  Result Date: 02/01/2021 CLINICAL DATA:  Discitis, needs durable venous access for treatment regimen EXAM: TUNNELED CENTRAL VENOUS CATHETER PLACEMENT WITH ULTRASOUND AND FLUOROSCOPIC GUIDANCE TECHNIQUE: The procedure, risks, benefits, and alternatives were explained to the family. Questions regarding the procedure were encouraged and answered. The family understands and consents to the procedure. Patency of the right EJ vein was confirmed with ultrasound with image documentation. An appropriate skin site was determined. Region was prepped using maximum  barrier technique including cap and mask, sterile gown, sterile gloves, large sterile sheet, and Chlorhexidine as cutaneous antisepsis. The region was infiltrated locally with 1% lidocaine. Under real-time ultrasound guidance, the right EJ vein was accessed with a 21 gauge micropuncture needle; the needle tip within the vein was confirmed with ultrasound image documentation. 24F dual-lumen cuffed PowerLine tunneled from a right anterior chest wall approach to the dermatotomy site. Needle exchanged over the 018 guidewire for transitional dilator, through which the catheter which had been cut to appropriate length was advanced under intermittent fluoroscopy, positioned with its tip at the cavoatrial junction. Spot chest radiograph confirms good catheter position. No pneumothorax. Catheter was flushed per protocol. Catheter secured externally with O Prolene suture. The right EJ dermatotomy site was closed with Dermabond. COMPLICATIONS: COMPLICATIONS None immediate FLUOROSCOPY TIME:  12 seconds; 3 mGy COMPARISON:  None IMPRESSION: 1. Technically successful placement of tunneled right EJ tunneled dual-lumen power injectable catheter with ultrasound and fluoroscopic guidance. Ready for routine use. Electronically Signed   By: Lucrezia Europe M.D.   On: 02/01/2021 15:29   IR US Guide Vasc Access Right  Result Date: 02/01/2021 CLINICAL DATA:  Discitis, needs durable venous access for treatment regimen EXAM: TUNNELED CENTRAL VENOUS CATHETER PLACEMENT WITH ULTRASOUND AND FLUOROSCOPIC GUIDANCE TECHNIQUE: The procedure, risks, benefits, and alternatives were explained to the family. Questions regarding the procedure were encouraged and answered. The family understands and consents to the procedure. Patency of the right EJ vein was confirmed with ultrasound with image  documentation. An appropriate skin site was determined. Region was prepped using maximum barrier technique including cap and mask, sterile gown, sterile gloves,  large sterile sheet, and Chlorhexidine as cutaneous antisepsis. The region was infiltrated locally with 1% lidocaine. Under real-time ultrasound guidance, the right EJ vein was accessed with a 21 gauge micropuncture needle; the needle tip within the vein was confirmed with ultrasound image documentation. 38F dual-lumen cuffed PowerLine tunneled from a right anterior chest wall approach to the dermatotomy site. Needle exchanged over the 018 guidewire for transitional dilator, through which the catheter which had been cut to appropriate length was advanced under intermittent fluoroscopy, positioned with its tip at the cavoatrial junction. Spot chest radiograph confirms good catheter position. No pneumothorax. Catheter was flushed per protocol. Catheter secured externally with O Prolene suture. The right EJ dermatotomy site was closed with Dermabond. COMPLICATIONS: COMPLICATIONS None immediate FLUOROSCOPY TIME:  12 seconds; 3 mGy COMPARISON:  None IMPRESSION: 1. Technically successful placement of tunneled right EJ tunneled dual-lumen power injectable catheter with ultrasound and fluoroscopic guidance. Ready for routine use. Electronically Signed   By: Lucrezia Europe M.D.   On: 02/01/2021 15:29   DG FLUORO GUIDED NEEDLE PLC ASPIRATION/INJECTION LOC  Result Date: 02/01/2021 CLINICAL DATA:  Right shoulder pain, demineralization along the joint surface is on radiography concerning for possible septic joint. EXAM: RIGHT SHOULDER ASPIRATION UNDER FLUOROSCOPY PROCEDURE: PROCEDURE I discussed the risks (including hemorrhage and infection, among others), benefits, and alternatives to the procedure with the patient's father, as the patient was not awake, aware, or alert. We specifically discussed the high technical likelihood of success of the procedure. The patient's father understood and elected for the patient to undergo the procedure. Standard time-out was employed. Following sterile skin prep and local anesthetic  administration consisting of 1% lidocaine, an 18 gauge needle was advanced without difficulty into the right glenohumeral joint under fluoroscopic guidance. A total of 14 cc of serosanguineous synovial fluid was removed and sent to the lab. The needle was subsequently removed and the skin cleansed and bandaged. No immediate complications were observed. FLUOROSCOPY TIME:  Fluoroscopy Time:  0 minutes, 24 seconds Radiation Exposure Index (if provided by the fluoroscopic device): 0.9 mGy Number of Acquired Spot Images: 0 IMPRESSION: Technically successful right glenohumeral joint aspiration under fluoroscopy. The 14 cc sample of joint fluid was sent to the laboratory. Electronically Signed   By: Van Clines M.D.   On: 02/01/2021 13:16   Medications: .  ceFAZolin (ANCEF) IV 1 g (02/01/21 1600)  . dialysis solution 1.5% low-MG/low-CA     . (feeding supplement) PROSource Plus  30 mL Oral BID BM  . acetaminophen  1,000 mg Oral TID  . benztropine  1 mg Oral QHS  . Chlorhexidine Gluconate Cloth  6 each Topical Daily  . cinacalcet  30 mg Oral Q supper  . darbepoetin (ARANESP) injection - NON-DIALYSIS  100 mcg Subcutaneous Q Mon-1800  . diclofenac Sodium  2 g Topical QID  . divalproex  750 mg Oral BID  . docusate sodium  100 mg Oral BID  . feeding supplement (NEPRO CARB STEADY)  237 mL Oral TID AC  . FLUoxetine  20 mg Oral Daily  . gentamicin cream  1 application Topical Daily  . dianeal solution for CAPD/CCPD with heparin   Peritoneal Dialysis Q24H  . iron polysaccharides  150 mg Oral QODAY  . levETIRAcetam  500 mg Oral BID  . levothyroxine  50 mcg Oral Q breakfast  . lidocaine  1 patch  Transdermal Daily  . losartan  25 mg Oral Daily  . multivitamin  1 tablet Oral Daily  . pantoprazole  40 mg Oral Daily  . risperiDONE  2 mg Oral QHS  . sevelamer carbonate  800 mg Oral BID WC  . sevelamer carbonate  800 mg Oral With snacks  . vitamin B-12  500 mcg Oral Daily    Dialysis Orders: [facility]CPD -  followed by [facility] (Dr. Nancy Marus) 4 exchanges over night, 2L each. Dry wt 71.5kg  Assessment/ Plan: 1. Discitis/ osteomyelitis/ epidural abscess: S/pL5 - S1 laminectomy + epidural abscess evacuationon 01/25/21.BCx 4/5 1 of 2 + micrococcus - ID thinks contaminant, Wound Cx grew Staph Epi. IRconsulted for tunneled central line for IV abx.. Will be on Cefazolinthru 5/17 (6 wks per ID). 2. ESRD: On CCPD - 4 exchanges, 2L volume. PD nightly while here- needsheparin to all fluids due to visible fibrin. 3. HTN/volume: BP borderline high, but no edema and decent UF last night. Using 1.5% PD fluids - currently below EDW. Does not appear overloaded. Continue Losartan 25mg  QD. 4. Schizophrenia/Bipolar: Per primary. 5. Seizuredisorder: Per primary. 6. Secondary HPTH: CorrCa and Phos ok - continue sevelamer + sensipar. 7. Anemiaof ESRD:Hgb 8.2->5.8 post-op. S/p 2U PRBCs on 4/7. Hgb 8.6. Aranesp started weekly. 8. Nutrition: Albumin very low, continue protein supplements. 9. R shoulder pain: S/p aspiration with IR, ^ WBC, Cx pending. 10. DISPO PLAN: Please let our team know as soon as possible if she needs to go to SNF at discharge rather than home. Most SNF will NOT do PD and therefore she may need to be converted to HD.   Veneta Penton, PA-C 02/02/2021, 9:47 AM  [employer]

## 2021-02-02 NOTE — Progress Notes (Signed)
PROGRESS NOTE    Jane [name]  QQP:[id number] DOB: 02-07-1966 DOA: 01/25/2021 PCP: [name], MD   Brief Narrative: Jane [name] is a 55 y.o. female with a history of intellectual disability, seizure disorder, schizophrenia/bipolar disorder, hypothyroidism, hypertension, hyperlipidemia, ESRD on PD.  Patient presented secondary to back pain was found to have an epidural abscess requiring surgical management by neurosurgery.  Patient found to have an MSSE infection.  Currently on cefazolin IV with plans for 6-week course duration.   Assessment & Plan:   Principal Problem:   Discitis of lumbosacral region Active Problems:   Schizoaffective disorder (HCC)   RETARDATION, MENTAL NOS   Essential hypertension   End-stage renal disease on peritoneal dialysis ([place])   Seizure disorder (HCC)   Pressure injury of skin   MSSE epidural abscess/discitis Patient underwent successful urgent laminectomy by neurosurgery.  Wound culture significant for Staph epidermidis.  Initial blood cultures were significant for Micrococcus luteus/lylae, thought to be contaminant.  Repeat blood cultures from 4/10 with no growth. Tunneled right EJ placed on 4/12 by IR. -Remove right IJ -Infectious disease recommendations: Cefazolin IV x6 weeks, renally dosed secondary to peritoneal dialysis with end date of 5/17  Right shoulder effusion Concern for septic arthritis.  Right shoulder pain arthrocentesis performed on 4/12. Fluid analysis with WBC of 16.5k with 95% neutrophils. Gram stain without organisms. Culture pending. Does not meet criteria for septic arthritis.  ESRD on PD Nephrology consulted. -Nephrology recommendations: PD nightly, Renvela  Chronic anemia of chronic kidney disease Iron deficiency anemia Acute on chronic anemia Baseline hemoglobin of about 7.5. Acute drop to 5.8 requiring 2 units of PRBC and possibly related to post-op anemia from perioperative blood loss. Hemoglobin  currently stable. -Continue Niferex  Seizure disorder -Continue Depakote, Keppra  Schizoaffective disorder Patient currently baseline. Labile affect. -Continue Cogentin, Prozac, Respirdal  Pressure injury Bilateral sacrum.  Present on admission.   DVT prophylaxis: heparin Code Status:   Code Status: Full Code Family Communication: Sister on telephone Disposition Plan: Recommendations for SNF, however family planning to have patient at home. Discharge home likely in 1-3 days pending recommendations from ID   Consultants:   Infectious disease  Neurosurgery  Interventional radiology  Orthopedic surgery  Procedures:   L5-S1 LAMINECTOMY FOR EPIDURAL ABSCESS (01/25/2021)  RIGHT ARTHROCENTESIS (02/01/2021)  RIGHT TUNNELED EJ (02/01/2021)   Antimicrobials:  Vancomycin  Ceftriaxone  Cefazolin    Subjective: No issues overnight.  Objective: Vitals:   02/01/21 1808 02/01/21 2110 02/02/21 0640 02/02/21 0915  BP: (!) 151/80 (!) 143/93 140/74 (!) 141/77  Pulse:  99 89 96  Resp: 14 18 18 17   Temp: (!) 97.4 F (36.3 C) (!) 97.5 F (36.4 C) 97.9 F (36.6 C) 98.8 F (37.1 C)  TempSrc: Axillary Oral Oral Oral  SpO2:  92% 99% 98%  Weight: 70.7 kg     Height:       No intake or output data in the 24 hours ending 02/02/21 1536 Filed Weights   01/30/21 0655 01/31/21 0739 02/01/21 1808  Weight: 70.6 kg 71.3 kg 70.7 kg    Examination:   General exam: Appears slightly agitated and uncomfortable. Conversant Respiratory: Clear to auscultation. Respiratory effort normal with no intercostal retractions or use of accessory muscles Cardiovascular: S1 & S2 heard, RRR. No murmurs, rubs, gallops or clicks. No edema Gastrointestinal: Abdomen is nondistended, soft and nontender. No masses felt. Normal bowel sounds heard Neurologic: No focal neurological deficits Musculoskeletal: No calf tenderness Skin: No cyanosis. No new  rashes Psychiatry: Alert and oriented to person and  city. Judgement and insight appear normal. Labile affect; crying intermittently    Data Reviewed: I have personally reviewed following labs and imaging studies  CBC Lab Results  Component Value Date   WBC 9.3 01/30/2021   RBC 2.84 (L) 01/30/2021   HGB 8.6 (L) 01/30/2021   HCT 27.0 (L) 01/30/2021   MCV 95.1 01/30/2021   MCH 30.3 01/30/2021   PLT 345 01/30/2021   MCHC 31.9 01/30/2021   RDW 17.0 (H) 01/30/2021   LYMPHSABS 1.1 01/25/2021   MONOABS 0.4 01/25/2021   EOSABS 0.0 01/25/2021   BASOSABS 0.0 93/57/0177     Last metabolic panel Lab Results  Component Value Date   NA 132 (L) 01/28/2021   K 4.4 01/28/2021   CL 93 (L) 01/28/2021   CO2 22 01/28/2021   BUN 63 (H) 01/28/2021   CREATININE 7.21 (H) 01/28/2021   GLUCOSE 71 01/28/2021   GFRNONAA 6 (L) 01/28/2021   GFRAA 4 (L) 12/09/2018   CALCIUM 7.2 (L) 01/28/2021   PHOS 5.0 (H) 01/28/2021   PROT 7.8 01/25/2021   ALBUMIN 1.3 (L) 01/28/2021   BILITOT 0.7 01/25/2021   ALKPHOS 319 (H) 01/25/2021   AST 14 (L) 01/25/2021   ALT 11 01/25/2021   ANIONGAP 17 (H) 01/28/2021    CBG (last 3)  No results for input(s): GLUCAP in the last 72 hours.   GFR: Estimated Creatinine Clearance: 8.4 mL/min (A) (by C-G formula based on SCr of 7.21 mg/dL (H)).  Coagulation Profile: No results for input(s): INR, PROTIME in the last 168 hours.  Recent Results (from the past 240 hour(s))  Blood culture (routine x 2)     Status: None   Collection Time: 01/25/21  4:37 AM   Specimen: BLOOD  Result Value Ref Range Status   Specimen Description BLOOD RIGHT ANTECUBITAL  Final   Special Requests   Final    BOTTLES DRAWN AEROBIC AND ANAEROBIC Blood Culture adequate volume   Culture   Final    NO GROWTH 5 DAYS Performed at [facility], [address] [place], [place] [address]    Report Status 01/30/2021 FINAL  Final  Resp Panel by RT-PCR (Flu A&B, Covid) Nasopharyngeal Swab     Status: None   Collection Time: 01/25/21  4:37 AM    Specimen: Nasopharyngeal Swab; Nasopharyngeal(NP) swabs in vial transport medium  Result Value Ref Range Status   SARS Coronavirus 2 by RT PCR NEGATIVE NEGATIVE Final    Comment: (NOTE) SARS-CoV-2 target nucleic acids are NOT DETECTED.  The SARS-CoV-2 RNA is generally detectable in upper respiratory specimens during the acute phase of infection. The lowest concentration of SARS-CoV-2 viral copies this assay can detect is 138 copies/mL. A negative result does not preclude SARS-Cov-2 infection and should not be used as the sole basis for treatment or other patient management decisions. A negative result may occur with  improper specimen collection/handling, submission of specimen other than nasopharyngeal swab, presence of viral mutation(s) within the areas targeted by this assay, and inadequate number of viral copies(<138 copies/mL). A negative result must be combined with clinical observations, patient history, and epidemiological information. The expected result is Negative.  Fact Sheet for Patients:  EntrepreneurPulse.com.au  Fact Sheet for Healthcare Providers:  IncredibleEmployment.be  This test is no t yet approved or cleared by the [place] FDA and  has been authorized for detection and/or diagnosis of SARS-CoV-2 by FDA under an Emergency Use Authorization (EUA). This  EUA will remain  in effect (meaning this test can be used) for the duration of the COVID-19 declaration under Section 564(b)(1) of the Act, 21 U.S.C.section 360bbb-3(b)(1), unless the authorization is terminated  or revoked sooner.       Influenza A by PCR NEGATIVE NEGATIVE Final   Influenza B by PCR NEGATIVE NEGATIVE Final    Comment: (NOTE) The Xpert Xpress SARS-CoV-2/FLU/RSV plus assay is intended as an aid in the diagnosis of influenza from Nasopharyngeal swab specimens and should not be used as a sole basis for treatment. Nasal washings and aspirates are  unacceptable for Xpert Xpress SARS-CoV-2/FLU/RSV testing.  Fact Sheet for Patients: EntrepreneurPulse.com.au  Fact Sheet for Healthcare Providers: IncredibleEmployment.be  This test is not yet approved or cleared by the [place] FDA and has been authorized for detection and/or diagnosis of SARS-CoV-2 by FDA under an Emergency Use Authorization (EUA). This EUA will remain in effect (meaning this test can be used) for the duration of the COVID-19 declaration under Section 564(b)(1) of the Act, 21 U.S.C. section 360bbb-3(b)(1), unless the authorization is terminated or revoked.  Performed at [facility], [place] [address], [place], [place] [address]   Blood culture (routine x 2)     Status: Abnormal   Collection Time: 01/25/21  4:38 AM   Specimen: BLOOD RIGHT HAND  Result Value Ref Range Status   Specimen Description BLOOD RIGHT HAND  Final   Special Requests   Final    BOTTLES DRAWN AEROBIC AND ANAEROBIC Blood Culture adequate volume   Culture  Setup Time   Final    GRAM POSITIVE COCCI AEROBIC BOTTLE ONLY CRITICAL RESULT CALLED TO, READ BACK BY AND VERIFIED WITH: PAM ROGERS, RN 01/30/2021 @0132  A.HUGHES    Culture (A)  Final    MICROCOCCUS LUTEUS/LYLAE THE SIGNIFICANCE OF ISOLATING THIS ORGANISM FROM A SINGLE SET OF BLOOD CULTURES WHEN MULTIPLE SETS ARE DRAWN IS UNCERTAIN. PLEASE NOTIFY THE MICROBIOLOGY DEPARTMENT WITHIN ONE WEEK IF SPECIATION AND SENSITIVITIES ARE REQUIRED. Performed at [facility], [place] [address] [place], [place] [address]    Report Status 01/31/2021 FINAL  Final  Blood Culture ID Panel (Reflexed)     Status: None   Collection Time: 01/25/21  4:38 AM  Result Value Ref Range Status   Enterococcus faecalis NOT DETECTED NOT DETECTED Corrected   Enterococcus Faecium NOT DETECTED NOT DETECTED Corrected   Listeria monocytogenes NOT DETECTED NOT DETECTED Corrected   Staphylococcus species NOT DETECTED NOT  DETECTED Corrected   Staphylococcus aureus (BCID) NOT DETECTED NOT DETECTED Corrected   Staphylococcus epidermidis NOT DETECTED NOT DETECTED Corrected   Staphylococcus lugdunensis NOT DETECTED NOT DETECTED Corrected   Streptococcus species NOT DETECTED NOT DETECTED Corrected   Streptococcus agalactiae NOT DETECTED NOT DETECTED Corrected   Streptococcus pneumoniae NOT DETECTED NOT DETECTED Corrected   Streptococcus pyogenes NOT DETECTED NOT DETECTED Corrected   A.calcoaceticus-baumannii NOT DETECTED NOT DETECTED Corrected   Bacteroides fragilis NOT DETECTED NOT DETECTED Corrected   Enterobacterales NOT DETECTED NOT DETECTED Corrected   Enterobacter cloacae complex NOT DETECTED NOT DETECTED Corrected   Escherichia coli NOT DETECTED NOT DETECTED Corrected   Klebsiella aerogenes NOT DETECTED NOT DETECTED Corrected   Klebsiella oxytoca NOT DETECTED NOT DETECTED Corrected   Klebsiella pneumoniae NOT DETECTED NOT DETECTED Corrected   Proteus species NOT DETECTED NOT DETECTED Corrected   Salmonella species NOT DETECTED NOT DETECTED Corrected   Serratia marcescens NOT DETECTED NOT DETECTED Corrected   Haemophilus influenzae NOT DETECTED NOT DETECTED Corrected  Neisseria meningitidis NOT DETECTED NOT DETECTED Corrected   Pseudomonas aeruginosa NOT DETECTED NOT DETECTED Corrected   Stenotrophomonas maltophilia NOT DETECTED NOT DETECTED Corrected   Candida albicans NOT DETECTED NOT DETECTED Corrected   Candida auris NOT DETECTED NOT DETECTED Corrected   Candida glabrata NOT DETECTED NOT DETECTED Corrected   Candida krusei NOT DETECTED NOT DETECTED Corrected   Candida parapsilosis NOT DETECTED NOT DETECTED Corrected   Candida tropicalis NOT DETECTED NOT DETECTED Corrected   Cryptococcus neoformans/gattii NOT DETECTED NOT DETECTED Corrected    Comment: Performed at [facility], [place] [address], [place], [place] [address]  Aerobic/Anaerobic Culture w Gram Stain (surgical/deep wound)      Status: None   Collection Time: 01/25/21  8:08 AM   Specimen: PATH Other; Body Fluid  Result Value Ref Range Status   Specimen Description ABSCESS  Final   Special Requests EPIDUAL ABSCESS SPEC A  Final   Gram Stain NO WBC SEEN NO ORGANISMS SEEN   Final   Culture   Final    RARE STAPHYLOCOCCUS EPIDERMIDIS NO ANAEROBES ISOLATED Performed at [facility], [address], [place], [place] [address]    Report Status 01/30/2021 FINAL  Final   Organism ID, Bacteria STAPHYLOCOCCUS EPIDERMIDIS  Final      Susceptibility   Staphylococcus epidermidis - MIC*    CIPROFLOXACIN <=0.5 SENSITIVE Sensitive     ERYTHROMYCIN >=8 RESISTANT Resistant     GENTAMICIN <=0.5 SENSITIVE Sensitive     OXACILLIN <=0.25 SENSITIVE Sensitive     TETRACYCLINE <=1 SENSITIVE Sensitive     VANCOMYCIN 1 SENSITIVE Sensitive     TRIMETH/SULFA <=10 SENSITIVE Sensitive     CLINDAMYCIN <=0.25 SENSITIVE Sensitive     RIFAMPIN <=0.5 SENSITIVE Sensitive     Inducible Clindamycin NEGATIVE Sensitive     * RARE STAPHYLOCOCCUS EPIDERMIDIS  Aerobic Culture w Gram Stain (superficial specimen)     Status: None   Collection Time: 01/25/21  8:14 AM   Specimen: Wound  Result Value Ref Range Status   Specimen Description WOUND  Final   Special Requests ABSC  Final   Gram Stain   Final    RARE WBC PRESENT,BOTH PMN AND MONONUCLEAR RARE GRAM POSITIVE COCCI IN PAIRS Performed at [facility], [address] [place], [place] [address]    Culture FEW STAPHYLOCOCCUS EPIDERMIDIS  Final   Report Status 01/27/2021 FINAL  Final   Organism ID, Bacteria STAPHYLOCOCCUS EPIDERMIDIS  Final      Susceptibility   Staphylococcus epidermidis - MIC*    CIPROFLOXACIN <=0.5 SENSITIVE Sensitive     ERYTHROMYCIN >=8 RESISTANT Resistant     GENTAMICIN <=0.5 SENSITIVE Sensitive     OXACILLIN <=0.25 SENSITIVE Sensitive     TETRACYCLINE <=1 SENSITIVE Sensitive     VANCOMYCIN 1 SENSITIVE Sensitive     TRIMETH/SULFA <=10 SENSITIVE  Sensitive     CLINDAMYCIN <=0.25 SENSITIVE Sensitive     RIFAMPIN <=0.5 SENSITIVE Sensitive     Inducible Clindamycin NEGATIVE Sensitive     * FEW STAPHYLOCOCCUS EPIDERMIDIS  Culture, blood (Routine X 2) w Reflex to ID Panel     Status: None (Preliminary result)   Collection Time: 01/30/21  3:15 PM   Specimen: BLOOD LEFT HAND  Result Value Ref Range Status   Specimen Description BLOOD LEFT HAND  Final   Special Requests   Final    BOTTLES DRAWN AEROBIC ONLY Blood Culture adequate volume   Culture   Final    NO GROWTH  3 DAYS Performed at [facility], [place] [address] [place], [place] [address]    Report Status PENDING  Incomplete  Body fluid culture w Gram Stain     Status: None (Preliminary result)   Collection Time: 02/01/21 12:21 PM   Specimen: PATH Cytology Misc. fluid; Synovial Fluid  Result Value Ref Range Status   Specimen Description FLUID RIGHT SHOULDER  Final   Special Requests NONE  Final   Gram Stain   Final    MODERATE WBC PRESENT, PREDOMINANTLY PMN NO ORGANISMS SEEN    Culture   Final    NO GROWTH < 24 HOURS Performed at [facility], [address] [place], [place] [address]    Report Status PENDING  Incomplete        Radiology Studies: IR Fluoro Guide CV Line Right  Result Date: 02/01/2021 CLINICAL DATA:  Discitis, needs durable venous access for treatment regimen EXAM: TUNNELED CENTRAL VENOUS CATHETER PLACEMENT WITH ULTRASOUND AND FLUOROSCOPIC GUIDANCE TECHNIQUE: The procedure, risks, benefits, and alternatives were explained to the family. Questions regarding the procedure were encouraged and answered. The family understands and consents to the procedure. Patency of the right EJ vein was confirmed with ultrasound with image documentation. An appropriate skin site was determined. Region was prepped using maximum barrier technique including cap and mask, sterile gown, sterile gloves, large sterile sheet, and Chlorhexidine as cutaneous antisepsis.  The region was infiltrated locally with 1% lidocaine. Under real-time ultrasound guidance, the right EJ vein was accessed with a 21 gauge micropuncture needle; the needle tip within the vein was confirmed with ultrasound image documentation. 41F dual-lumen cuffed PowerLine tunneled from a right anterior chest wall approach to the dermatotomy site. Needle exchanged over the 018 guidewire for transitional dilator, through which the catheter which had been cut to appropriate length was advanced under intermittent fluoroscopy, positioned with its tip at the cavoatrial junction. Spot chest radiograph confirms good catheter position. No pneumothorax. Catheter was flushed per protocol. Catheter secured externally with O Prolene suture. The right EJ dermatotomy site was closed with Dermabond. COMPLICATIONS: COMPLICATIONS None immediate FLUOROSCOPY TIME:  12 seconds; 3 mGy COMPARISON:  None IMPRESSION: 1. Technically successful placement of tunneled right EJ tunneled dual-lumen power injectable catheter with ultrasound and fluoroscopic guidance. Ready for routine use. Electronically Signed   By: Lucrezia Europe M.D.   On: 02/01/2021 15:29   IR US Guide Vasc Access Right  Result Date: 02/01/2021 CLINICAL DATA:  Discitis, needs durable venous access for treatment regimen EXAM: TUNNELED CENTRAL VENOUS CATHETER PLACEMENT WITH ULTRASOUND AND FLUOROSCOPIC GUIDANCE TECHNIQUE: The procedure, risks, benefits, and alternatives were explained to the family. Questions regarding the procedure were encouraged and answered. The family understands and consents to the procedure. Patency of the right EJ vein was confirmed with ultrasound with image documentation. An appropriate skin site was determined. Region was prepped using maximum barrier technique including cap and mask, sterile gown, sterile gloves, large sterile sheet, and Chlorhexidine as cutaneous antisepsis. The region was infiltrated locally with 1% lidocaine. Under real-time  ultrasound guidance, the right EJ vein was accessed with a 21 gauge micropuncture needle; the needle tip within the vein was confirmed with ultrasound image documentation. 41F dual-lumen cuffed PowerLine tunneled from a right anterior chest wall approach to the dermatotomy site. Needle exchanged over the 018 guidewire for transitional dilator, through which the catheter which had been cut to appropriate length was advanced under intermittent fluoroscopy, positioned with its tip at the cavoatrial junction. Spot chest  radiograph confirms good catheter position. No pneumothorax. Catheter was flushed per protocol. Catheter secured externally with O Prolene suture. The right EJ dermatotomy site was closed with Dermabond. COMPLICATIONS: COMPLICATIONS None immediate FLUOROSCOPY TIME:  12 seconds; 3 mGy COMPARISON:  None IMPRESSION: 1. Technically successful placement of tunneled right EJ tunneled dual-lumen power injectable catheter with ultrasound and fluoroscopic guidance. Ready for routine use. Electronically Signed   By: Lucrezia Europe M.D.   On: 02/01/2021 15:29   DG FLUORO GUIDED NEEDLE PLC ASPIRATION/INJECTION LOC  Result Date: 02/01/2021 CLINICAL DATA:  Right shoulder pain, demineralization along the joint surface is on radiography concerning for possible septic joint. EXAM: RIGHT SHOULDER ASPIRATION UNDER FLUOROSCOPY PROCEDURE: PROCEDURE I discussed the risks (including hemorrhage and infection, among others), benefits, and alternatives to the procedure with the patient's father, as the patient was not awake, aware, or alert. We specifically discussed the high technical likelihood of success of the procedure. The patient's father understood and elected for the patient to undergo the procedure. Standard time-out was employed. Following sterile skin prep and local anesthetic administration consisting of 1% lidocaine, an 18 gauge needle was advanced without difficulty into the right glenohumeral joint under  fluoroscopic guidance. A total of 14 cc of serosanguineous synovial fluid was removed and sent to the lab. The needle was subsequently removed and the skin cleansed and bandaged. No immediate complications were observed. FLUOROSCOPY TIME:  Fluoroscopy Time:  0 minutes, 24 seconds Radiation Exposure Index (if provided by the fluoroscopic device): 0.9 mGy Number of Acquired Spot Images: 0 IMPRESSION: Technically successful right glenohumeral joint aspiration under fluoroscopy. The 14 cc sample of joint fluid was sent to the laboratory. Electronically Signed   By: Van Clines M.D.   On: 02/01/2021 13:16        Scheduled Meds: . acetaminophen  1,000 mg Oral TID  . benztropine  1 mg Oral QHS  . Chlorhexidine Gluconate Cloth  6 each Topical Daily  . cinacalcet  30 mg Oral Q supper  . darbepoetin (ARANESP) injection - NON-DIALYSIS  100 mcg Subcutaneous Q Mon-1800  . diclofenac Sodium  2 g Topical QID  . divalproex  750 mg Oral BID  . docusate sodium  100 mg Oral BID  . [START ON 02/03/2021] feeding supplement  237 mL Oral BID BM  . FLUoxetine  20 mg Oral Daily  . gentamicin cream  1 application Topical Daily  . dianeal solution for CAPD/CCPD with heparin   Peritoneal Dialysis Q24H  . iron polysaccharides  150 mg Oral QODAY  . levETIRAcetam  500 mg Oral BID  . levothyroxine  50 mcg Oral Q breakfast  . lidocaine  1 patch Transdermal Daily  . losartan  25 mg Oral Daily  . multivitamin  1 tablet Oral Daily  . pantoprazole  40 mg Oral Daily  . risperiDONE  2 mg Oral QHS  . sevelamer carbonate  800 mg Oral BID WC  . sevelamer carbonate  800 mg Oral With snacks  . vitamin B-12  500 mcg Oral Daily   Continuous Infusions: .  ceFAZolin (ANCEF) IV 1 g (02/01/21 1600)     LOS: 8 days     Cordelia Poche, MD [facility] 02/02/2021, 3:36 PM  If 7PM-7AM, please contact night-coverage [email or web address]

## 2021-02-02 NOTE — Progress Notes (Signed)
[facility]    Date of Admission:  01/25/2021   Total days of antibiotics 10           ID: Jane [name] is a 55 y.o. female with MSSE lumbar epidural abscess/discitis  S/p L5-S1 decompressive laminectomy with evacuation of epidural abscess on 4/5 with possible right shoulder septic arthritis Principal Problem:   Discitis of lumbosacral region Active Problems:   Schizoaffective disorder (HCC)   RETARDATION, MENTAL NOS   Essential hypertension   End-stage renal disease on peritoneal dialysis (HCC)   Seizure disorder (HCC)   Pressure injury of skin    Subjective: Afebrile, underwent right shoulder aspiration (cell count concerning for septic arthritis)  Medications:  . (feeding supplement) PROSource Plus  30 mL Oral BID BM  . acetaminophen  1,000 mg Oral TID  . benztropine  1 mg Oral QHS  . Chlorhexidine Gluconate Cloth  6 each Topical Daily  . cinacalcet  30 mg Oral Q supper  . darbepoetin (ARANESP) injection - NON-DIALYSIS  100 mcg Subcutaneous Q Mon-1800  . diclofenac Sodium  2 g Topical QID  . divalproex  750 mg Oral BID  . docusate sodium  100 mg Oral BID  . feeding supplement (NEPRO CARB STEADY)  237 mL Oral TID AC  . FLUoxetine  20 mg Oral Daily  . gentamicin cream  1 application Topical Daily  . dianeal solution for CAPD/CCPD with heparin   Peritoneal Dialysis Q24H  . iron polysaccharides  150 mg Oral QODAY  . levETIRAcetam  500 mg Oral BID  . levothyroxine  50 mcg Oral Q breakfast  . lidocaine  1 patch Transdermal Daily  . losartan  25 mg Oral Daily  . multivitamin  1 tablet Oral Daily  . pantoprazole  40 mg Oral Daily  . risperiDONE  2 mg Oral QHS  . sevelamer carbonate  800 mg Oral BID WC  . sevelamer carbonate  800 mg Oral With snacks  . vitamin B-12  500 mcg Oral Daily    Objective: Vital signs in last 24 hours: Temp:  [97.4 F (36.3 C)-98.8 F (37.1 C)] 98.8 F (37.1 C) (04/13 0915) Pulse Rate:  [89-99] 96 (04/13  0915) Resp:  [14-18] 17 (04/13 0915) BP: (130-151)/(74-93) 141/77 (04/13 0915) SpO2:  [92 %-99 %] 98 % (04/13 0915) Weight:  [70.7 kg] 70.7 kg (04/12 1808) Physical Exam  Constitutional:  oriented to person, only. appears well-developed and well-nourished. No distress.  HENT: [place]/AT, PERRLA, no scleral icterus Mouth/Throat: Oropharynx is clear and moist. No oropharyngeal exudate.  Cardiovascular: Normal rate, regular rhythm and normal heart sounds. Exam reveals no gallop and no friction rub.  No murmur heard.  Pulmonary/Chest: Effort normal and breath sounds normal. No respiratory distress.  has no wheezes.  Neck = supple, no nuchal rigidity Abdominal: Soft. Bowel sounds are normal.  exhibits no distension. There is no tenderness.  Lymphadenopathy: no cervical adenopathy. No axillary adenopathy Neurological: alert and oriented to person, place, and time.  Skin: Skin is warm and dry. No rash noted. No erythema.  Psychiatric: appropriate, wakes up to answer questions, not tearful   Lab Results  Microbiology: 4/5 epidural abscess Staphylococcus epidermidis      MIC    CIPROFLOXACIN <=0.5 SENSI... Sensitive    CLINDAMYCIN <=0.25 SENS... Sensitive    ERYTHROMYCIN >=8 RESISTANT  Resistant    GENTAMICIN <=0.5 SENSI... Sensitive    Inducible Clindamycin NEGATIVE  Sensitive    OXACILLIN <=0.25 SENS... Sensitive  RIFAMPIN <=0.5 SENSI... Sensitive    TETRACYCLINE <=1 SENSITIVE  Sensitive    TRIMETH/SULFA <=10 SENSIT... Sensitive    VANCOMYCIN 1 SENSITIVE  Sensitive     Studies/Results: IR Fluoro Guide CV Line Right  Result Date: 02/01/2021 CLINICAL DATA:  Discitis, needs durable venous access for treatment regimen EXAM: TUNNELED CENTRAL VENOUS CATHETER PLACEMENT WITH ULTRASOUND AND FLUOROSCOPIC GUIDANCE TECHNIQUE: The procedure, risks, benefits, and alternatives were explained to the family. Questions regarding the procedure were encouraged and answered. The family understands and  consents to the procedure. Patency of the right EJ vein was confirmed with ultrasound with image documentation. An appropriate skin site was determined. Region was prepped using maximum barrier technique including cap and mask, sterile gown, sterile gloves, large sterile sheet, and Chlorhexidine as cutaneous antisepsis. The region was infiltrated locally with 1% lidocaine. Under real-time ultrasound guidance, the right EJ vein was accessed with a 21 gauge micropuncture needle; the needle tip within the vein was confirmed with ultrasound image documentation. 36F dual-lumen cuffed PowerLine tunneled from a right anterior chest wall approach to the dermatotomy site. Needle exchanged over the 018 guidewire for transitional dilator, through which the catheter which had been cut to appropriate length was advanced under intermittent fluoroscopy, positioned with its tip at the cavoatrial junction. Spot chest radiograph confirms good catheter position. No pneumothorax. Catheter was flushed per protocol. Catheter secured externally with O Prolene suture. The right EJ dermatotomy site was closed with Dermabond. COMPLICATIONS: COMPLICATIONS None immediate FLUOROSCOPY TIME:  12 seconds; 3 mGy COMPARISON:  None IMPRESSION: 1. Technically successful placement of tunneled right EJ tunneled dual-lumen power injectable catheter with ultrasound and fluoroscopic guidance. Ready for routine use. Electronically Signed   By: Lucrezia Europe M.D.   On: 02/01/2021 15:29   IR US Guide Vasc Access Right  Result Date: 02/01/2021 CLINICAL DATA:  Discitis, needs durable venous access for treatment regimen EXAM: TUNNELED CENTRAL VENOUS CATHETER PLACEMENT WITH ULTRASOUND AND FLUOROSCOPIC GUIDANCE TECHNIQUE: The procedure, risks, benefits, and alternatives were explained to the family. Questions regarding the procedure were encouraged and answered. The family understands and consents to the procedure. Patency of the right EJ vein was confirmed with  ultrasound with image documentation. An appropriate skin site was determined. Region was prepped using maximum barrier technique including cap and mask, sterile gown, sterile gloves, large sterile sheet, and Chlorhexidine as cutaneous antisepsis. The region was infiltrated locally with 1% lidocaine. Under real-time ultrasound guidance, the right EJ vein was accessed with a 21 gauge micropuncture needle; the needle tip within the vein was confirmed with ultrasound image documentation. 36F dual-lumen cuffed PowerLine tunneled from a right anterior chest wall approach to the dermatotomy site. Needle exchanged over the 018 guidewire for transitional dilator, through which the catheter which had been cut to appropriate length was advanced under intermittent fluoroscopy, positioned with its tip at the cavoatrial junction. Spot chest radiograph confirms good catheter position. No pneumothorax. Catheter was flushed per protocol. Catheter secured externally with O Prolene suture. The right EJ dermatotomy site was closed with Dermabond. COMPLICATIONS: COMPLICATIONS None immediate FLUOROSCOPY TIME:  12 seconds; 3 mGy COMPARISON:  None IMPRESSION: 1. Technically successful placement of tunneled right EJ tunneled dual-lumen power injectable catheter with ultrasound and fluoroscopic guidance. Ready for routine use. Electronically Signed   By: Lucrezia Europe M.D.   On: 02/01/2021 15:29   DG FLUORO GUIDED NEEDLE PLC ASPIRATION/INJECTION LOC  Result Date: 02/01/2021 CLINICAL DATA:  Right shoulder pain, demineralization along the joint surface is on radiography concerning  for possible septic joint. EXAM: RIGHT SHOULDER ASPIRATION UNDER FLUOROSCOPY PROCEDURE: PROCEDURE I discussed the risks (including hemorrhage and infection, among others), benefits, and alternatives to the procedure with the patient's father, as the patient was not awake, aware, or alert. We specifically discussed the high technical likelihood of success of the  procedure. The patient's father understood and elected for the patient to undergo the procedure. Standard time-out was employed. Following sterile skin prep and local anesthetic administration consisting of 1% lidocaine, an 18 gauge needle was advanced without difficulty into the right glenohumeral joint under fluoroscopic guidance. A total of 14 cc of serosanguineous synovial fluid was removed and sent to the lab. The needle was subsequently removed and the skin cleansed and bandaged. No immediate complications were observed. FLUOROSCOPY TIME:  Fluoroscopy Time:  0 minutes, 24 seconds Radiation Exposure Index (if provided by the fluoroscopic device): 0.9 mGy Number of Acquired Spot Images: 0 IMPRESSION: Technically successful right glenohumeral joint aspiration under fluoroscopy. The 14 cc sample of joint fluid was sent to the laboratory. Electronically Signed   By: Van Clines M.D.   On: 02/01/2021 13:16   IMPRESSION: 1. Severe Discitis-Osteomyelitis at L5-S1 with bulky prevertebral and multifocal epidural abscesses. Prevertebral abscess component extending from L5-S3 estimated at 14 mL. Discontinuous L5 and sacral epidural abscesses with dural thickening result in severe spinal stenosis below the L4-L5 level. Bilateral L5 foraminal involvement. Mild stenosis at L4-L5 from dural thickening.  2. Comparatively mild paraspinal soft tissue phlegmon at this time. Generalized presacral edema. Mild marrow edema in the sacral ala, but no definite SI joint infection at this time.  Assessment/Plan: MSSE epidural abscess = has undergone decompression on 4/5. With the plan with 6 wk  Will check sed rate and crp today. Since has been on tx for day 8. Continue on cefazolin renally dosed  Probable right septic shoulder = will follow up on recent aspiration. If +, would get ortho to evaluate for wash out  ESRD on PD = renally dosed cefazolin.   Venous access = currently has right neck IJ. Would need  to consider moving to chest wall.  [facility] Cell: [phone] Pager: [phone]  02/02/2021, 12:44 PM

## 2021-02-02 NOTE — Progress Notes (Addendum)
Providing Compassionate, Quality Care - Together   Subjective: Patient working with therapies upon assessment. Patient crying out, but stops when distracted.  Objective: Vital signs in last 24 hours: Temp:  [97.4 F (36.3 C)-98.4 F (36.9 C)] 97.9 F (36.6 C) (04/13 0640) Pulse Rate:  [89-99] 89 (04/13 0640) Resp:  [14-18] 18 (04/13 0640) BP: (130-151)/(74-93) 140/74 (04/13 0640) SpO2:  [92 %-99 %] 99 % (04/13 0640) Weight:  [70.7 kg] 70.7 kg (04/12 1808)  Intake/Output from previous day: No intake/output data recorded. Intake/Output this shift: No intake/output data recorded.  Alert PERRLA MAE, Strengthappears intact Incision is covered with Honeycomb dressing  Lab Results: No results for input(s): WBC, HGB, HCT, PLT in the last 72 hours. BMET No results for input(s): NA, K, CL, CO2, GLUCOSE, BUN, CREATININE, CALCIUM in the last 72 hours.  Studies/Results: IR Fluoro Guide CV Line Right  Result Date: 02/01/2021 CLINICAL DATA:  Discitis, needs durable venous access for treatment regimen EXAM: TUNNELED CENTRAL VENOUS CATHETER PLACEMENT WITH ULTRASOUND AND FLUOROSCOPIC GUIDANCE TECHNIQUE: The procedure, risks, benefits, and alternatives were explained to the family. Questions regarding the procedure were encouraged and answered. The family understands and consents to the procedure. Patency of the right EJ vein was confirmed with ultrasound with image documentation. An appropriate skin site was determined. Region was prepped using maximum barrier technique including cap and mask, sterile gown, sterile gloves, large sterile sheet, and Chlorhexidine as cutaneous antisepsis. The region was infiltrated locally with 1% lidocaine. Under real-time ultrasound guidance, the right EJ vein was accessed with a 21 gauge micropuncture needle; the needle tip within the vein was confirmed with ultrasound image documentation. 735F dual-lumen cuffed PowerLine tunneled from a right anterior chest  wall approach to the dermatotomy site. Needle exchanged over the 018 guidewire for transitional dilator, through which the catheter which had been cut to appropriate length was advanced under intermittent fluoroscopy, positioned with its tip at the cavoatrial junction. Spot chest radiograph confirms good catheter position. No pneumothorax. Catheter was flushed per protocol. Catheter secured externally with O Prolene suture. The right EJ dermatotomy site was closed with Dermabond. COMPLICATIONS: COMPLICATIONS None immediate FLUOROSCOPY TIME:  12 seconds; 3 mGy COMPARISON:  None IMPRESSION: 1. Technically successful placement of tunneled right EJ tunneled dual-lumen power injectable catheter with ultrasound and fluoroscopic guidance. Ready for routine use. Electronically Signed   By: Lucrezia Europe M.D.   On: 02/01/2021 15:29   IR US Guide Vasc Access Right  Result Date: 02/01/2021 CLINICAL DATA:  Discitis, needs durable venous access for treatment regimen EXAM: TUNNELED CENTRAL VENOUS CATHETER PLACEMENT WITH ULTRASOUND AND FLUOROSCOPIC GUIDANCE TECHNIQUE: The procedure, risks, benefits, and alternatives were explained to the family. Questions regarding the procedure were encouraged and answered. The family understands and consents to the procedure. Patency of the right EJ vein was confirmed with ultrasound with image documentation. An appropriate skin site was determined. Region was prepped using maximum barrier technique including cap and mask, sterile gown, sterile gloves, large sterile sheet, and Chlorhexidine as cutaneous antisepsis. The region was infiltrated locally with 1% lidocaine. Under real-time ultrasound guidance, the right EJ vein was accessed with a 21 gauge micropuncture needle; the needle tip within the vein was confirmed with ultrasound image documentation. 735F dual-lumen cuffed PowerLine tunneled from a right anterior chest wall approach to the dermatotomy site. Needle exchanged over the 018  guidewire for transitional dilator, through which the catheter which had been cut to appropriate length was advanced under intermittent fluoroscopy, positioned with its tip  at the cavoatrial junction. Spot chest radiograph confirms good catheter position. No pneumothorax. Catheter was flushed per protocol. Catheter secured externally with O Prolene suture. The right EJ dermatotomy site was closed with Dermabond. COMPLICATIONS: COMPLICATIONS None immediate FLUOROSCOPY TIME:  12 seconds; 3 mGy COMPARISON:  None IMPRESSION: 1. Technically successful placement of tunneled right EJ tunneled dual-lumen power injectable catheter with ultrasound and fluoroscopic guidance. Ready for routine use. Electronically Signed   By: Lucrezia Europe M.D.   On: 02/01/2021 15:29   DG FLUORO GUIDED NEEDLE PLC ASPIRATION/INJECTION LOC  Result Date: 02/01/2021 CLINICAL DATA:  Right shoulder pain, demineralization along the joint surface is on radiography concerning for possible septic joint. EXAM: RIGHT SHOULDER ASPIRATION UNDER FLUOROSCOPY PROCEDURE: PROCEDURE I discussed the risks (including hemorrhage and infection, among others), benefits, and alternatives to the procedure with the patient's father, as the patient was not awake, aware, or alert. We specifically discussed the high technical likelihood of success of the procedure. The patient's father understood and elected for the patient to undergo the procedure. Standard time-out was employed. Following sterile skin prep and local anesthetic administration consisting of 1% lidocaine, an 18 gauge needle was advanced without difficulty into the right glenohumeral joint under fluoroscopic guidance. A total of 14 cc of serosanguineous synovial fluid was removed and sent to the lab. The needle was subsequently removed and the skin cleansed and bandaged. No immediate complications were observed. FLUOROSCOPY TIME:  Fluoroscopy Time:  0 minutes, 24 seconds Radiation Exposure Index (if provided  by the fluoroscopic device): 0.9 mGy Number of Acquired Spot Images: 0 IMPRESSION: Technically successful right glenohumeral joint aspiration under fluoroscopy. The 14 cc sample of joint fluid was sent to the laboratory. Electronically Signed   By: Van Clines M.D.   On: 02/01/2021 13:16    Assessment/Plan: Patient underwent L5-S1 decompressive laminectomy with evacuation of epidural abscess on 01/25/2021 by Dr. Annette Stable.Right shoulder effusion noted on x-ray. Ortho ordered an IR guided tap that was performed 02/01/2021.   LOS: 8 days    -Honeycomb dressing removed. Fine to leave incision open to air if not draining. Cover with gauze if any drainage noted and change as needed to keep dry -Continue to mobilize as tolerated. -Pain control. --ABX per ID. Started on cefazolin 01/27/2021. Will need 6 weeks abx therapy(end date 03/09/2021). -Recommendation is for SNF at discharge    Viona Gilmore, [place], AGNP-C Nurse Practitioner  [facility] [place] [address], [place], [place], [place] [address] P: [phone]    F: [phone]  02/02/2021, 10:42 AM

## 2021-02-02 NOTE — Progress Notes (Signed)
Nutrition Follow Up  DOCUMENTATION CODES:   Not applicable  INTERVENTION:   Liberalize diet to REGULAR given poor intake  Recommend Cortrak placement with EN if intake does not progress  Nursing to assist patient with all meals   Ensure Enlive po BID, each supplement provides 350 kcal and 20 grams of protein  Magic cup BID with meals, each supplement provides 290 kcal and 9 grams of protein  Renal MVI daily   NUTRITION DIAGNOSIS:   Inadequate oral intake related to lethargy/confusion as evidenced by meal completion < 50%.  Ongoing  GOAL:   Patient will meet greater than or equal to 90% of their needs   Not meeting   MONITOR:   PO intake,Labs  REASON FOR ASSESSMENT:   Consult Assessment of nutrition requirement/status  ASSESSMENT:   Pt admitted with worsening back pain x 1 month and worsening constipation. Imaging in ED concerning for osteomyelitis and a epidural abscess. Neurosurgery consulted. At baseline, lives with her sister who is her caregiver. PMH relevant for cognitive delays, HLD, HTN, ESRD on PD, GERD  4/05 - s/p L5-S1 decompressive laminectomy with evacuation of epidural abscess, drain placement 4/12 - s/p R shoulder aspiration  Intake fluctuates given mental status. Per RN, patient taking in more when family is here to feed her. Last two meal completions charted as 0%. She does not like Nepro or ProSource. Trial Ensure and [employer]. RD to place nursing care order to assist patient with all meals.   Admission weight: 72.5 kg  Current weight: 70.7 kg   CCPD: 1.5% dextrose, 4 fills, 2L bag. Provides 164-204 kcal per day  Medications: sensipar, aranesp, colace, renvela, vitamin B12 Labs: Na 132 (L) Phosphorus 5.0 (H)   NUTRITION - FOCUSED PHYSICAL EXAM:  Diet Order:   Diet Order            Diet renal with fluid restriction Fluid restriction: 1200 mL Fluid; Room service appropriate? Yes; Fluid consistency: Thin  Diet effective now                 EDUCATION NEEDS:  No education needs have been identified at this time  Skin:  Skin Assessment: Skin Integrity Issues: Skin Integrity Issues:: Stage II,Incisions Stage II: sacrum Incisions: surgical - back  Last BM:  4/10  Height:  Ht Readings from Last 1 Encounters:  01/25/21 5\' 6"  (1.676 m)    Weight:  Wt Readings from Last 1 Encounters:  02/01/21 70.7 kg    Ideal Body Weight:  59.1 kg  BMI:  Body mass index is 25.16 kg/m.  Estimated Nutritional Needs:   Kcal:  1800-2000  Protein:  90-100  Fluid:  1827mL/d  Mariana Single RD, LDN Clinical Nutrition Pager listed in [facility]

## 2021-02-03 ENCOUNTER — Other Ambulatory Visit: Payer: Medicare Other

## 2021-02-03 DIAGNOSIS — M00011 Staphylococcal arthritis, right shoulder: Secondary | ICD-10-CM

## 2021-02-03 DIAGNOSIS — B957 Other staphylococcus as the cause of diseases classified elsewhere: Secondary | ICD-10-CM | POA: Diagnosis not present

## 2021-02-03 DIAGNOSIS — M4647 Discitis, unspecified, lumbosacral region: Secondary | ICD-10-CM | POA: Diagnosis not present

## 2021-02-03 DIAGNOSIS — G062 Extradural and subdural abscess, unspecified: Secondary | ICD-10-CM | POA: Diagnosis not present

## 2021-02-03 LAB — HEPATIC FUNCTION PANEL
ALT: <5 U/L (ref 0–44)
AST: 14 U/L — ABNORMAL LOW (ref 15–41)
Albumin: 1.3 g/dL — ABNORMAL LOW (ref 3.5–5.0)
Alkaline Phosphatase: 267 U/L — ABNORMAL HIGH (ref 38–126)
Bilirubin, Direct: <0.1 mg/dL (ref 0.0–0.2)
Total Bilirubin: 0.4 mg/dL (ref 0.3–1.2)
Total Protein: 5.6 g/dL — ABNORMAL LOW (ref 6.5–8.1)

## 2021-02-03 LAB — VALPROIC ACID LEVEL: Valproic Acid Lvl: 18 ug/mL — ABNORMAL LOW (ref 50.0–100.0)

## 2021-02-03 MED ORDER — HEPARIN SODIUM (PORCINE) 1000 UNIT/ML IJ SOLN
INTRAMUSCULAR | Status: AC
  Filled 2021-02-03: qty 4

## 2021-02-03 MED ORDER — DARBEPOETIN ALFA 200 MCG/0.4ML IJ SOSY
PREFILLED_SYRINGE | INTRAMUSCULAR | Status: AC
  Filled 2021-02-03: qty 0.4

## 2021-02-03 MED ORDER — HYDROMORPHONE HCL 1 MG/ML IJ SOLN
INTRAMUSCULAR | Status: AC
  Filled 2021-02-03: qty 0.5

## 2021-02-03 MED ORDER — HEPARIN 1000 UNIT/ML FOR PERITONEAL DIALYSIS
INTRAPERITONEAL | Status: DC | PRN
  Filled 2021-02-03 (×4): qty 5000

## 2021-02-03 MED ORDER — POLYETHYLENE GLYCOL 3350 17 G PO PACK
17.0000 g | PACK | Freq: Every day | ORAL | Status: DC
  Administered 2021-02-03: 17 g via ORAL

## 2021-02-03 NOTE — Progress Notes (Signed)
Patient ID: Jane [name], female   DOB: [birth year], 55 y.o.   MRN: [id number]  Contacted by attending with recommendation from ID for operative I&D of joint. Given cell count and negative gram stain and culture do not feel this represents septic arthritis and, therefore, I&D is not indicated.    Lisette Abu, PA-C Orthopedic Surgery [phone]

## 2021-02-03 NOTE — Progress Notes (Signed)
PROGRESS NOTE    Jane [name]  JSH:[id number] DOB: 11-Jun-1966 DOA: 01/25/2021 PCP: [name], MD   Brief Narrative: Jane [name] is a 55 y.o. female with a history of intellectual disability, seizure disorder, schizophrenia/bipolar disorder, hypothyroidism, hypertension, hyperlipidemia, ESRD on PD.  Patient presented secondary to back pain was found to have an epidural abscess requiring surgical management by neurosurgery.  Patient found to have an MSSE infection.  Currently on cefazolin IV with plans for 6-week course duration.   Assessment & Plan:   Principal Problem:   Discitis of lumbosacral region Active Problems:   Schizoaffective disorder (HCC)   RETARDATION, MENTAL NOS   Essential hypertension   End-stage renal disease on peritoneal dialysis ([place])   Seizure disorder (HCC)   Pressure injury of skin   MSSE epidural abscess/discitis Patient underwent successful urgent laminectomy by neurosurgery.  Wound culture significant for Staph epidermidis.  Initial blood cultures were significant for Micrococcus luteus/lylae, thought to be contaminant.  Repeat blood cultures from 4/10 with no growth. Tunneled right EJ placed on 4/12 by IR. -Infectious disease recommendations: Cefazolin IV x6 weeks, renally dosed secondary to peritoneal dialysis with end date of 5/17  Right shoulder effusion Concern for septic arthritis.  Right shoulder pain arthrocentesis performed on 4/12. Fluid analysis with WBC of 16.5k with 95% neutrophils. Gram stain without organisms. Culture pending. Does not meet criteria for septic arthritis however possibly related to patient treatment with IV antibiotics. ID recommending orthopedic surgery consult for joint washout. Discussed with orthopedic surgery who decline washout secondary to joint fluid not meeting criteria for septic arthritis. Will need close follow-up of her right shoulder to ensure no worsening symptoms. Repeat imaging, MRI, needed if  any worsening symptoms.  ESRD on PD Nephrology consulted. -Nephrology recommendations: PD nightly, Renvela  Chronic anemia of chronic kidney disease Iron deficiency anemia Acute on chronic anemia Baseline hemoglobin of about 7.5. Acute drop to 5.8 requiring 2 units of PRBC and possibly related to post-op anemia from perioperative blood loss. Hemoglobin currently stable. -Continue Niferex  Seizure disorder -Continue Depakote, Keppra  Schizoaffective disorder Patient currently baseline. Labile affect. -Continue Cogentin, Prozac, Respirdal  Pressure injury Bilateral sacrum.  Present on admission.   DVT prophylaxis: heparin Code Status:   Code Status: Full Code Family Communication: Sister on telephone Disposition Plan: Recommendations for SNF, however family planning to have patient at home. Discharge home likely in 1 day    Consultants:   Infectious disease  Neurosurgery  Nephrology  Interventional radiology  Orthopedic surgery  Procedures:   L5-S1 LAMINECTOMY FOR EPIDURAL ABSCESS (01/25/2021)  RIGHT ARTHROCENTESIS (02/01/2021)  RIGHT TUNNELED EJ (02/01/2021)   Antimicrobials:  Vancomycin  Ceftriaxone  Cefazolin    Subjective: No issues noted overnight. Patient states no shoulder pain.  Objective: Vitals:   02/02/21 1652 02/02/21 1958 02/03/21 0505 02/03/21 0741  BP: (!) 144/67 133/66 130/70 (!) 145/72  Pulse: 100 91 95 97  Resp: 16 16 16    Temp: 98.1 F (36.7 C) 98.3 F (36.8 C) 98.2 F (36.8 C) 98.7 F (37.1 C)  TempSrc: Oral Oral Oral Oral  SpO2: 98% 94% 96% 99%  Weight: 71.3 kg   69.1 kg  Height:        Intake/Output Summary (Last 24 hours) at 02/03/2021 1125 Last data filed at 02/02/2021 1608 Gross per 24 hour  Intake 0 ml  Output --  Net 0 ml   Filed Weights   02/01/21 1808 02/02/21 1652 02/03/21 0741  Weight: 70.7 kg 71.3  kg 69.1 kg    Examination:   General exam: Appears slightly agitated and uncomfortable Respiratory  system: Clear to auscultation. Respiratory effort normal. Cardiovascular system: S1 & S2 heard, RRR. No murmurs, rubs, gallops or clicks. Gastrointestinal system: Abdomen is nondistended, soft and nontender. No organomegaly or masses felt. Normal bowel sounds heard. Central nervous system: Alert and oriented. No focal neurological deficits. Musculoskeletal:  No calf tenderness Skin: No cyanosis. No rashes Psychiatry: Labile affect    Data Reviewed: I have personally reviewed following labs and imaging studies  CBC Lab Results  Component Value Date   WBC 7.4 02/02/2021   RBC 2.75 (L) 02/02/2021   HGB 8.6 (L) 02/02/2021   HCT 26.2 (L) 02/02/2021   MCV 95.3 02/02/2021   MCH 31.3 02/02/2021   PLT 361 02/02/2021   MCHC 32.8 02/02/2021   RDW 16.8 (H) 02/02/2021   LYMPHSABS 1.1 01/25/2021   MONOABS 0.4 01/25/2021   EOSABS 0.0 01/25/2021   BASOSABS 0.0 78/24/2353     Last metabolic panel Lab Results  Component Value Date   NA 134 (L) 02/02/2021   K 3.4 (L) 02/02/2021   CL 96 (L) 02/02/2021   CO2 22 02/02/2021   BUN 53 (H) 02/02/2021   CREATININE 7.65 (H) 02/02/2021   GLUCOSE 70 02/02/2021   GFRNONAA 6 (L) 02/02/2021   GFRAA 4 (L) 12/09/2018   CALCIUM 7.3 (L) 02/02/2021   PHOS 5.0 (H) 01/28/2021   PROT 5.6 (L) 02/03/2021   ALBUMIN 1.3 (L) 02/03/2021   BILITOT 0.4 02/03/2021   ALKPHOS 267 (H) 02/03/2021   AST 14 (L) 02/03/2021   ALT <5 02/03/2021   ANIONGAP 16 (H) 02/02/2021    CBG (last 3)  No results for input(s): GLUCAP in the last 72 hours.   GFR: Estimated Creatinine Clearance: 7.9 mL/min (A) (by C-G formula based on SCr of 7.65 mg/dL (H)).  Coagulation Profile: No results for input(s): INR, PROTIME in the last 168 hours.  Recent Results (from the past 240 hour(s))  Blood culture (routine x 2)     Status: None   Collection Time: 01/25/21  4:37 AM   Specimen: BLOOD  Result Value Ref Range Status   Specimen Description BLOOD RIGHT ANTECUBITAL  Final    Special Requests   Final    BOTTLES DRAWN AEROBIC AND ANAEROBIC Blood Culture adequate volume   Culture   Final    NO GROWTH 5 DAYS Performed at [facility], [address], [place], [place] [address]    Report Status 01/30/2021 FINAL  Final  Resp Panel by RT-PCR (Flu A&B, Covid) Nasopharyngeal Swab     Status: None   Collection Time: 01/25/21  4:37 AM   Specimen: Nasopharyngeal Swab; Nasopharyngeal(NP) swabs in vial transport medium  Result Value Ref Range Status   SARS Coronavirus 2 by RT PCR NEGATIVE NEGATIVE Final    Comment: (NOTE) SARS-CoV-2 target nucleic acids are NOT DETECTED.  The SARS-CoV-2 RNA is generally detectable in upper respiratory specimens during the acute phase of infection. The lowest concentration of SARS-CoV-2 viral copies this assay can detect is 138 copies/mL. A negative result does not preclude SARS-Cov-2 infection and should not be used as the sole basis for treatment or other patient management decisions. A negative result may occur with  improper specimen collection/handling, submission of specimen other than nasopharyngeal swab, presence of viral mutation(s) within the areas targeted by this assay, and inadequate number of viral copies(<138 copies/mL). A negative result must be combined with clinical  observations, patient history, and epidemiological information. The expected result is Negative.  Fact Sheet for Patients:  EntrepreneurPulse.com.au  Fact Sheet for Healthcare Providers:  IncredibleEmployment.be  This test is no t yet approved or cleared by the [place] FDA and  has been authorized for detection and/or diagnosis of SARS-CoV-2 by FDA under an Emergency Use Authorization (EUA). This EUA will remain  in effect (meaning this test can be used) for the duration of the COVID-19 declaration under Section 564(b)(1) of the Act, 21 U.S.C.section 360bbb-3(b)(1), unless the authorization is terminated   or revoked sooner.       Influenza A by PCR NEGATIVE NEGATIVE Final   Influenza B by PCR NEGATIVE NEGATIVE Final    Comment: (NOTE) The Xpert Xpress SARS-CoV-2/FLU/RSV plus assay is intended as an aid in the diagnosis of influenza from Nasopharyngeal swab specimens and should not be used as a sole basis for treatment. Nasal washings and aspirates are unacceptable for Xpert Xpress SARS-CoV-2/FLU/RSV testing.  Fact Sheet for Patients: EntrepreneurPulse.com.au  Fact Sheet for Healthcare Providers: IncredibleEmployment.be  This test is not yet approved or cleared by the [place] FDA and has been authorized for detection and/or diagnosis of SARS-CoV-2 by FDA under an Emergency Use Authorization (EUA). This EUA will remain in effect (meaning this test can be used) for the duration of the COVID-19 declaration under Section 564(b)(1) of the Act, 21 U.S.C. section 360bbb-3(b)(1), unless the authorization is terminated or revoked.  Performed at [facility], [place] [address], [place], [place] [address]   Blood culture (routine x 2)     Status: Abnormal   Collection Time: 01/25/21  4:38 AM   Specimen: BLOOD RIGHT HAND  Result Value Ref Range Status   Specimen Description BLOOD RIGHT HAND  Final   Special Requests   Final    BOTTLES DRAWN AEROBIC AND ANAEROBIC Blood Culture adequate volume   Culture  Setup Time   Final    GRAM POSITIVE COCCI AEROBIC BOTTLE ONLY CRITICAL RESULT CALLED TO, READ BACK BY AND VERIFIED WITH: PAM ROGERS, RN 01/30/2021 @0132  A.HUGHES    Culture (A)  Final    MICROCOCCUS LUTEUS/LYLAE THE SIGNIFICANCE OF ISOLATING THIS ORGANISM FROM A SINGLE SET OF BLOOD CULTURES WHEN MULTIPLE SETS ARE DRAWN IS UNCERTAIN. PLEASE NOTIFY THE MICROBIOLOGY DEPARTMENT WITHIN ONE WEEK IF SPECIATION AND SENSITIVITIES ARE REQUIRED. Performed at [facility], [place] [address] [place], [place] [address]    Report Status 01/31/2021  FINAL  Final  Blood Culture ID Panel (Reflexed)     Status: None   Collection Time: 01/25/21  4:38 AM  Result Value Ref Range Status   Enterococcus faecalis NOT DETECTED NOT DETECTED Corrected   Enterococcus Faecium NOT DETECTED NOT DETECTED Corrected   Listeria monocytogenes NOT DETECTED NOT DETECTED Corrected   Staphylococcus species NOT DETECTED NOT DETECTED Corrected   Staphylococcus aureus (BCID) NOT DETECTED NOT DETECTED Corrected   Staphylococcus epidermidis NOT DETECTED NOT DETECTED Corrected   Staphylococcus lugdunensis NOT DETECTED NOT DETECTED Corrected   Streptococcus species NOT DETECTED NOT DETECTED Corrected   Streptococcus agalactiae NOT DETECTED NOT DETECTED Corrected   Streptococcus pneumoniae NOT DETECTED NOT DETECTED Corrected   Streptococcus pyogenes NOT DETECTED NOT DETECTED Corrected   A.calcoaceticus-baumannii NOT DETECTED NOT DETECTED Corrected   Bacteroides fragilis NOT DETECTED NOT DETECTED Corrected   Enterobacterales NOT DETECTED NOT DETECTED Corrected   Enterobacter cloacae complex NOT DETECTED NOT DETECTED Corrected   Escherichia coli NOT DETECTED NOT DETECTED Corrected   Klebsiella  aerogenes NOT DETECTED NOT DETECTED Corrected   Klebsiella oxytoca NOT DETECTED NOT DETECTED Corrected   Klebsiella pneumoniae NOT DETECTED NOT DETECTED Corrected   Proteus species NOT DETECTED NOT DETECTED Corrected   Salmonella species NOT DETECTED NOT DETECTED Corrected   Serratia marcescens NOT DETECTED NOT DETECTED Corrected   Haemophilus influenzae NOT DETECTED NOT DETECTED Corrected   Neisseria meningitidis NOT DETECTED NOT DETECTED Corrected   Pseudomonas aeruginosa NOT DETECTED NOT DETECTED Corrected   Stenotrophomonas maltophilia NOT DETECTED NOT DETECTED Corrected   Candida albicans NOT DETECTED NOT DETECTED Corrected   Candida auris NOT DETECTED NOT DETECTED Corrected   Candida glabrata NOT DETECTED NOT DETECTED Corrected   Candida krusei NOT DETECTED NOT  DETECTED Corrected   Candida parapsilosis NOT DETECTED NOT DETECTED Corrected   Candida tropicalis NOT DETECTED NOT DETECTED Corrected   Cryptococcus neoformans/gattii NOT DETECTED NOT DETECTED Corrected    Comment: Performed at [facility], [place] [address] [place], [place] [address]  Aerobic/Anaerobic Culture w Gram Stain (surgical/deep wound)     Status: None   Collection Time: 01/25/21  8:08 AM   Specimen: PATH Other; Body Fluid  Result Value Ref Range Status   Specimen Description ABSCESS  Final   Special Requests EPIDUAL ABSCESS SPEC A  Final   Gram Stain NO WBC SEEN NO ORGANISMS SEEN   Final   Culture   Final    RARE STAPHYLOCOCCUS EPIDERMIDIS NO ANAEROBES ISOLATED Performed at [facility], [address], [place], [place] [address]    Report Status 01/30/2021 FINAL  Final   Organism ID, Bacteria STAPHYLOCOCCUS EPIDERMIDIS  Final      Susceptibility   Staphylococcus epidermidis - MIC*    CIPROFLOXACIN <=0.5 SENSITIVE Sensitive     ERYTHROMYCIN >=8 RESISTANT Resistant     GENTAMICIN <=0.5 SENSITIVE Sensitive     OXACILLIN <=0.25 SENSITIVE Sensitive     TETRACYCLINE <=1 SENSITIVE Sensitive     VANCOMYCIN 1 SENSITIVE Sensitive     TRIMETH/SULFA <=10 SENSITIVE Sensitive     CLINDAMYCIN <=0.25 SENSITIVE Sensitive     RIFAMPIN <=0.5 SENSITIVE Sensitive     Inducible Clindamycin NEGATIVE Sensitive     * RARE STAPHYLOCOCCUS EPIDERMIDIS  Aerobic Culture w Gram Stain (superficial specimen)     Status: None   Collection Time: 01/25/21  8:14 AM   Specimen: Wound  Result Value Ref Range Status   Specimen Description WOUND  Final   Special Requests ABSC  Final   Gram Stain   Final    RARE WBC PRESENT,BOTH PMN AND MONONUCLEAR RARE GRAM POSITIVE COCCI IN PAIRS Performed at [facility], [address], [place], [place] [address]    Culture FEW STAPHYLOCOCCUS EPIDERMIDIS  Final   Report Status 01/27/2021 FINAL  Final   Organism ID, Bacteria STAPHYLOCOCCUS  EPIDERMIDIS  Final      Susceptibility   Staphylococcus epidermidis - MIC*    CIPROFLOXACIN <=0.5 SENSITIVE Sensitive     ERYTHROMYCIN >=8 RESISTANT Resistant     GENTAMICIN <=0.5 SENSITIVE Sensitive     OXACILLIN <=0.25 SENSITIVE Sensitive     TETRACYCLINE <=1 SENSITIVE Sensitive     VANCOMYCIN 1 SENSITIVE Sensitive     TRIMETH/SULFA <=10 SENSITIVE Sensitive     CLINDAMYCIN <=0.25 SENSITIVE Sensitive     RIFAMPIN <=0.5 SENSITIVE Sensitive     Inducible Clindamycin NEGATIVE Sensitive     * FEW STAPHYLOCOCCUS EPIDERMIDIS  Culture, blood (Routine X 2) w Reflex to ID Panel  Status: None (Preliminary result)   Collection Time: 01/30/21  3:15 PM   Specimen: BLOOD LEFT HAND  Result Value Ref Range Status   Specimen Description BLOOD LEFT HAND  Final   Special Requests   Final    BOTTLES DRAWN AEROBIC ONLY Blood Culture adequate volume   Culture   Final    NO GROWTH 4 DAYS Performed at [facility], [address], [place], [place] [address]    Report Status PENDING  Incomplete  Body fluid culture w Gram Stain     Status: None (Preliminary result)   Collection Time: 02/01/21 12:21 PM   Specimen: PATH Cytology Misc. fluid; Synovial Fluid  Result Value Ref Range Status   Specimen Description FLUID RIGHT SHOULDER  Final   Special Requests NONE  Final   Gram Stain   Final    MODERATE WBC PRESENT, PREDOMINANTLY PMN NO ORGANISMS SEEN    Culture   Final    NO GROWTH 2 DAYS Performed at [facility], [address], [place], [place] [address]    Report Status PENDING  Incomplete        Radiology Studies: IR Fluoro Guide CV Line Right  Result Date: 02/01/2021 CLINICAL DATA:  Discitis, needs durable venous access for treatment regimen EXAM: TUNNELED CENTRAL VENOUS CATHETER PLACEMENT WITH ULTRASOUND AND FLUOROSCOPIC GUIDANCE TECHNIQUE: The procedure, risks, benefits, and alternatives were explained to the family. Questions regarding the procedure were encouraged and  answered. The family understands and consents to the procedure. Patency of the right EJ vein was confirmed with ultrasound with image documentation. An appropriate skin site was determined. Region was prepped using maximum barrier technique including cap and mask, sterile gown, sterile gloves, large sterile sheet, and Chlorhexidine as cutaneous antisepsis. The region was infiltrated locally with 1% lidocaine. Under real-time ultrasound guidance, the right EJ vein was accessed with a 21 gauge micropuncture needle; the needle tip within the vein was confirmed with ultrasound image documentation. 24F dual-lumen cuffed PowerLine tunneled from a right anterior chest wall approach to the dermatotomy site. Needle exchanged over the 018 guidewire for transitional dilator, through which the catheter which had been cut to appropriate length was advanced under intermittent fluoroscopy, positioned with its tip at the cavoatrial junction. Spot chest radiograph confirms good catheter position. No pneumothorax. Catheter was flushed per protocol. Catheter secured externally with O Prolene suture. The right EJ dermatotomy site was closed with Dermabond. COMPLICATIONS: COMPLICATIONS None immediate FLUOROSCOPY TIME:  12 seconds; 3 mGy COMPARISON:  None IMPRESSION: 1. Technically successful placement of tunneled right EJ tunneled dual-lumen power injectable catheter with ultrasound and fluoroscopic guidance. Ready for routine use. Electronically Signed   By: Lucrezia Europe M.D.   On: 02/01/2021 15:29   IR US Guide Vasc Access Right  Result Date: 02/01/2021 CLINICAL DATA:  Discitis, needs durable venous access for treatment regimen EXAM: TUNNELED CENTRAL VENOUS CATHETER PLACEMENT WITH ULTRASOUND AND FLUOROSCOPIC GUIDANCE TECHNIQUE: The procedure, risks, benefits, and alternatives were explained to the family. Questions regarding the procedure were encouraged and answered. The family understands and consents to the procedure. Patency of  the right EJ vein was confirmed with ultrasound with image documentation. An appropriate skin site was determined. Region was prepped using maximum barrier technique including cap and mask, sterile gown, sterile gloves, large sterile sheet, and Chlorhexidine as cutaneous antisepsis. The region was infiltrated locally with 1% lidocaine. Under real-time ultrasound guidance, the right EJ vein was accessed with a 21 gauge micropuncture needle; the needle  tip within the vein was confirmed with ultrasound image documentation. 57F dual-lumen cuffed PowerLine tunneled from a right anterior chest wall approach to the dermatotomy site. Needle exchanged over the 018 guidewire for transitional dilator, through which the catheter which had been cut to appropriate length was advanced under intermittent fluoroscopy, positioned with its tip at the cavoatrial junction. Spot chest radiograph confirms good catheter position. No pneumothorax. Catheter was flushed per protocol. Catheter secured externally with O Prolene suture. The right EJ dermatotomy site was closed with Dermabond. COMPLICATIONS: COMPLICATIONS None immediate FLUOROSCOPY TIME:  12 seconds; 3 mGy COMPARISON:  None IMPRESSION: 1. Technically successful placement of tunneled right EJ tunneled dual-lumen power injectable catheter with ultrasound and fluoroscopic guidance. Ready for routine use. Electronically Signed   By: Lucrezia Europe M.D.   On: 02/01/2021 15:29   DG FLUORO GUIDED NEEDLE PLC ASPIRATION/INJECTION LOC  Result Date: 02/01/2021 CLINICAL DATA:  Right shoulder pain, demineralization along the joint surface is on radiography concerning for possible septic joint. EXAM: RIGHT SHOULDER ASPIRATION UNDER FLUOROSCOPY PROCEDURE: PROCEDURE I discussed the risks (including hemorrhage and infection, among others), benefits, and alternatives to the procedure with the patient's father, as the patient was not awake, aware, or alert. We specifically discussed the high  technical likelihood of success of the procedure. The patient's father understood and elected for the patient to undergo the procedure. Standard time-out was employed. Following sterile skin prep and local anesthetic administration consisting of 1% lidocaine, an 18 gauge needle was advanced without difficulty into the right glenohumeral joint under fluoroscopic guidance. A total of 14 cc of serosanguineous synovial fluid was removed and sent to the lab. The needle was subsequently removed and the skin cleansed and bandaged. No immediate complications were observed. FLUOROSCOPY TIME:  Fluoroscopy Time:  0 minutes, 24 seconds Radiation Exposure Index (if provided by the fluoroscopic device): 0.9 mGy Number of Acquired Spot Images: 0 IMPRESSION: Technically successful right glenohumeral joint aspiration under fluoroscopy. The 14 cc sample of joint fluid was sent to the laboratory. Electronically Signed   By: Van Clines M.D.   On: 02/01/2021 13:16        Scheduled Meds: . acetaminophen  1,000 mg Oral TID  . benztropine  1 mg Oral QHS  . Chlorhexidine Gluconate Cloth  6 each Topical Daily  . cinacalcet  30 mg Oral Q supper  . darbepoetin (ARANESP) injection - NON-DIALYSIS  100 mcg Subcutaneous Q Mon-1800  . diclofenac Sodium  2 g Topical QID  . divalproex  750 mg Oral BID  . docusate sodium  100 mg Oral BID  . feeding supplement  237 mL Oral BID BM  . FLUoxetine  20 mg Oral Daily  . gentamicin cream  1 application Topical Daily  . dianeal solution for CAPD/CCPD with heparin   Peritoneal Dialysis Q24H  . iron polysaccharides  150 mg Oral QODAY  . levETIRAcetam  500 mg Oral BID  . levothyroxine  50 mcg Oral Q breakfast  . lidocaine  1 patch Transdermal Daily  . losartan  25 mg Oral Daily  . multivitamin  1 tablet Oral Daily  . pantoprazole  40 mg Oral Daily  . risperiDONE  2 mg Oral QHS  . sevelamer carbonate  800 mg Oral BID WC  . sevelamer carbonate  800 mg Oral With snacks  .  vitamin B-12  500 mcg Oral Daily   Continuous Infusions: .  ceFAZolin (ANCEF) IV 1 g (02/02/21 1632)     LOS: 9 days  Cordelia Poche, MD [facility] 02/03/2021, 11:25 AM  If 7PM-7AM, please contact night-coverage [email or web address]

## 2021-02-03 NOTE — Progress Notes (Signed)
[facility] Progress Note   Subjective:  Seen in room - hungry and eating breakfast (grilled cheese). PD went fine last night, but no UF (net + 46mL). She denies CP or dyspnea.  Objective Vitals:   02/02/21 1652 02/02/21 1958 02/03/21 0505 02/03/21 0741  BP: (!) 144/67 133/66 130/70 (!) 145/72  Pulse: 100 91 95 97  Resp: 16 16 16    Temp: 98.1 F (36.7 C) 98.3 F (36.8 C) 98.2 F (36.8 C) 98.7 F (37.1 C)  TempSrc: Oral Oral Oral Oral  SpO2: 98% 94% 96% 99%  Weight: 71.3 kg   69.1 kg  Height:       Physical Exam General:Chronically ill appearing woman, NAD. Heart:RRR; no murmur Lungs:CTA anteriorly Abdomen:soft, non-tender. PD cath in R abdomen/bandaged. Extremities:No LE edema Dialysis Access:PD cath, as above.  Additional Objective Labs: Basic Metabolic Panel: Recent Labs  Lab 01/28/21 0417 02/02/21 1901  NA 132* 134*  K 4.4 3.4*  CL 93* 96*  CO2 22 22  GLUCOSE 71 70  BUN 63* 53*  CREATININE 7.21* 7.65*  CALCIUM 7.2* 7.3*  PHOS 5.0*  --    Liver Function Tests: Recent Labs  Lab 01/28/21 0417  ALBUMIN 1.3*   CBC: Recent Labs  Lab 01/28/21 0417 01/29/21 0403 01/30/21 0338 02/02/21 1901  WBC 9.9 8.8 9.3 7.4  HGB 8.3* 8.5* 8.6* 8.6*  HCT 25.2* 26.1* 27.0* 26.2*  MCV 95.1 93.9 95.1 95.3  PLT 348 357 345 361   Blood Culture    Component Value Date/Time   SDES FLUID RIGHT SHOULDER 02/01/2021 1221   SPECREQUEST NONE 02/01/2021 1221   CULT  02/01/2021 1221    NO GROWTH < 24 HOURS Performed at [facility], [place] [address], [place], [place] [address]    REPTSTATUS PENDING 02/01/2021 1221   Studies/Results: IR Fluoro Guide CV Line Right  Result Date: 02/01/2021 CLINICAL DATA:  Discitis, needs durable venous access for treatment regimen EXAM: TUNNELED CENTRAL VENOUS CATHETER PLACEMENT WITH ULTRASOUND AND FLUOROSCOPIC GUIDANCE TECHNIQUE: The procedure, risks, benefits, and alternatives were explained to the family.  Questions regarding the procedure were encouraged and answered. The family understands and consents to the procedure. Patency of the right EJ vein was confirmed with ultrasound with image documentation. An appropriate skin site was determined. Region was prepped using maximum barrier technique including cap and mask, sterile gown, sterile gloves, large sterile sheet, and Chlorhexidine as cutaneous antisepsis. The region was infiltrated locally with 1% lidocaine. Under real-time ultrasound guidance, the right EJ vein was accessed with a 21 gauge micropuncture needle; the needle tip within the vein was confirmed with ultrasound image documentation. 11F dual-lumen cuffed PowerLine tunneled from a right anterior chest wall approach to the dermatotomy site. Needle exchanged over the 018 guidewire for transitional dilator, through which the catheter which had been cut to appropriate length was advanced under intermittent fluoroscopy, positioned with its tip at the cavoatrial junction. Spot chest radiograph confirms good catheter position. No pneumothorax. Catheter was flushed per protocol. Catheter secured externally with O Prolene suture. The right EJ dermatotomy site was closed with Dermabond. COMPLICATIONS: COMPLICATIONS None immediate FLUOROSCOPY TIME:  12 seconds; 3 mGy COMPARISON:  None IMPRESSION: 1. Technically successful placement of tunneled right EJ tunneled dual-lumen power injectable catheter with ultrasound and fluoroscopic guidance. Ready for routine use. Electronically Signed   By: Lucrezia Europe M.D.   On: 02/01/2021 15:29   IR US Guide Vasc Access Right  Result Date: 02/01/2021 CLINICAL DATA:  Discitis, needs  durable venous access for treatment regimen EXAM: TUNNELED CENTRAL VENOUS CATHETER PLACEMENT WITH ULTRASOUND AND FLUOROSCOPIC GUIDANCE TECHNIQUE: The procedure, risks, benefits, and alternatives were explained to the family. Questions regarding the procedure were encouraged and answered. The family  understands and consents to the procedure. Patency of the right EJ vein was confirmed with ultrasound with image documentation. An appropriate skin site was determined. Region was prepped using maximum barrier technique including cap and mask, sterile gown, sterile gloves, large sterile sheet, and Chlorhexidine as cutaneous antisepsis. The region was infiltrated locally with 1% lidocaine. Under real-time ultrasound guidance, the right EJ vein was accessed with a 21 gauge micropuncture needle; the needle tip within the vein was confirmed with ultrasound image documentation. 77F dual-lumen cuffed PowerLine tunneled from a right anterior chest wall approach to the dermatotomy site. Needle exchanged over the 018 guidewire for transitional dilator, through which the catheter which had been cut to appropriate length was advanced under intermittent fluoroscopy, positioned with its tip at the cavoatrial junction. Spot chest radiograph confirms good catheter position. No pneumothorax. Catheter was flushed per protocol. Catheter secured externally with O Prolene suture. The right EJ dermatotomy site was closed with Dermabond. COMPLICATIONS: COMPLICATIONS None immediate FLUOROSCOPY TIME:  12 seconds; 3 mGy COMPARISON:  None IMPRESSION: 1. Technically successful placement of tunneled right EJ tunneled dual-lumen power injectable catheter with ultrasound and fluoroscopic guidance. Ready for routine use. Electronically Signed   By: Lucrezia Europe M.D.   On: 02/01/2021 15:29   DG FLUORO GUIDED NEEDLE PLC ASPIRATION/INJECTION LOC  Result Date: 02/01/2021 CLINICAL DATA:  Right shoulder pain, demineralization along the joint surface is on radiography concerning for possible septic joint. EXAM: RIGHT SHOULDER ASPIRATION UNDER FLUOROSCOPY PROCEDURE: PROCEDURE I discussed the risks (including hemorrhage and infection, among others), benefits, and alternatives to the procedure with the patient's father, as the patient was not awake,  aware, or alert. We specifically discussed the high technical likelihood of success of the procedure. The patient's father understood and elected for the patient to undergo the procedure. Standard time-out was employed. Following sterile skin prep and local anesthetic administration consisting of 1% lidocaine, an 18 gauge needle was advanced without difficulty into the right glenohumeral joint under fluoroscopic guidance. A total of 14 cc of serosanguineous synovial fluid was removed and sent to the lab. The needle was subsequently removed and the skin cleansed and bandaged. No immediate complications were observed. FLUOROSCOPY TIME:  Fluoroscopy Time:  0 minutes, 24 seconds Radiation Exposure Index (if provided by the fluoroscopic device): 0.9 mGy Number of Acquired Spot Images: 0 IMPRESSION: Technically successful right glenohumeral joint aspiration under fluoroscopy. The 14 cc sample of joint fluid was sent to the laboratory. Electronically Signed   By: Van Clines M.D.   On: 02/01/2021 13:16   Medications: .  ceFAZolin (ANCEF) IV 1 g (02/02/21 1632)   . acetaminophen  1,000 mg Oral TID  . benztropine  1 mg Oral QHS  . Chlorhexidine Gluconate Cloth  6 each Topical Daily  . cinacalcet  30 mg Oral Q supper  . darbepoetin (ARANESP) injection - NON-DIALYSIS  100 mcg Subcutaneous Q Mon-1800  . diclofenac Sodium  2 g Topical QID  . divalproex  750 mg Oral BID  . docusate sodium  100 mg Oral BID  . feeding supplement  237 mL Oral BID BM  . FLUoxetine  20 mg Oral Daily  . gentamicin cream  1 application Topical Daily  . dianeal solution for CAPD/CCPD with heparin   Peritoneal  Dialysis Q24H  . iron polysaccharides  150 mg Oral QODAY  . levETIRAcetam  500 mg Oral BID  . levothyroxine  50 mcg Oral Q breakfast  . lidocaine  1 patch Transdermal Daily  . losartan  25 mg Oral Daily  . multivitamin  1 tablet Oral Daily  . pantoprazole  40 mg Oral Daily  . risperiDONE  2 mg Oral QHS  . sevelamer  carbonate  800 mg Oral BID WC  . sevelamer carbonate  800 mg Oral With snacks  . vitamin B-12  500 mcg Oral Daily    Dialysis Orders: [facility]CPD - followed by [facility] (Dr. Nancy Marus) 4 exchanges over night, 2L each. Dry wt 71.5kg  Assessment/ Plan: 1. Discitis/ osteomyelitis/ epidural abscess: S/pL5 - S1 laminectomy + epidural abscess evacuationon 01/25/21.BCx 4/5 1 of 2 + micrococcus - ID thinks contaminant, Wound Cx grew Staph Epi. S/p tunneled central line for abx. Will be on Cefazolinthru 5/17 (6 wks per ID). 2. ESRD: On CCPD - 4 exchanges, 2L volume. PD nightly while here- needsheparin to all fluids due to visible fibrin. 3. HTN/volume: BP borderlinehigh, no edema but no UF overnight. Will change to half 2.5% - half 1.5% fluids tonight. 4. Schizophrenia/Bipolar: Per primary. 5. Seizuredisorder: Per primary. 6. Secondary HPTH: CorrCa and Phos ok - continue sevelamer + sensipar. 7. Anemiaof ESRD:Hgb 8.2->5.8 post-op. S/p 2U PRBCs on 4/7. Hgb 8.6. Aranesp started weekly. 8. Nutrition: Albumin very low, continue protein supplements. 9. R shoulder pain: S/p aspiration with IR, presumed septic arthritis. Per ID. 10. DISPO PLAN: Please let our team know as soon as possible if she needs to go to SNF at discharge rather than home. Most SNF will NOT do PD and therefore she may need to be converted to HD. Per last note, family hoping to take her home.   Veneta Penton, PA-C 02/03/2021, 8:57 AM  [employer]

## 2021-02-03 NOTE — Progress Notes (Signed)
Physical Therapy Treatment Patient Details Name: Jane [name] MRN: [id number] DOB: 05/08/1966 Today's Date: 02/03/2021    History of Present Illness The patient is a 55 y.o. year-old w/ hx of schizophrenia, seizure d/o, ESRD on PD, mental retardation (moderate severity), HTN, HL, hypothyroid, bipolar d/o, anemia presented yest to ED w/ abd pain, back pain and leg pain for 1 month. Pt is on home peritoneal dialysis for the last 6 yrs, [place] nephrology [place] W/U showed osteo/ discitis at L5-S1 w/ destructive changes of the anterior superior endplate of S1. Also large lateral epidural abscess causing significant compression of the thecal sac. Pt was taken to surgery for L5-S1 laminectomy and evacuation of epidural abscess.  Postop pt is on 5 N. Pt lives with sister who provides 24/7 care. Right shoulder arthrocentesis on 4/12.    PT Comments    Pt supine in bed.  Performed tx with clinical specialist as we continue to have difficulty progressing patient due to behavioral barriers from her cognitive status.  Pt continues to benefit from SNF placement but family prefers to take her home.  Pt was observed with just allowing food to sit in her mouth vs. Actively eating.  PTA brushed tongue and cheeks to remove residue from the bolus in her mouth.  Informed nursing she would benefit from a speech consult.   Will plan for tilt bed therapies vs. Sit to stand with walking belt.  Pt will need updated DME items listed below if d/c home.     Follow Up Recommendations  SNF;Supervision/Assistance - 24 hour     Equipment Recommendations  Wheelchair cushion (measurements PT);Wheelchair (measurements PT);Rolling walker with 5" wheels;3in1 (PT);Hospital bed;Other (comment) (hoyer lift and hoyer lift pad.)    Recommendations for Other Services       Precautions / Restrictions Precautions Precautions: Back Precaution Booklet Issued: Yes (comment) Precaution Comments: spinal precautions Required Braces or  Orthoses: Spinal Brace Spinal Brace: Lumbar corset;Applied in sitting position Restrictions Weight Bearing Restrictions: Yes Other Position/Activity Restrictions: No bending, lifting, twisting    Mobility  Bed Mobility Overal bed mobility: Needs Assistance Bed Mobility: Rolling;Sidelying to Sit Rolling: Mod assist Sidelying to sit: Mod assist;+2 for physical assistance       General bed mobility comments: Cues for sequencing with log rolling and assistance to move LEs to EOB and elevate trunk into sitting posture and vice versa. Pt continued to push posterior in sitting.    Transfers Overall transfer level: Needs assistance Equipment used: Ambulation equipment used (sara + lift used for sit to stand.) Transfers: Sit to/from Stand Sit to Stand: Mod assist;+2 physical assistance         General transfer comment: Mod +2 to move into standing this session by keeping elbow and forearms on platforms.  She continues to have poor hip extension and poor trunk control.  Unable to progress to gt but did achieve a good standing trial this session.  On second attempt noted to buckle and move arms slumping down in the lift  Ambulation/Gait Ambulation/Gait assistance:  (unable- not safe yet)               Stairs             Wheelchair Mobility    Modified Rankin (Stroke Patients Only)       Balance Overall balance assessment: Needs assistance Sitting-balance support: Bilateral upper extremity supported Sitting balance-Leahy Scale: Poor Sitting balance - Comments: Pushes posteriorly Postural control: Posterior lean   Standing balance-Leahy Scale:  Poor Standing balance comment: pushing posterior                            Cognition Arousal/Alertness: Awake/alert Behavior During Therapy: Anxious;Restless Overall Cognitive Status: History of cognitive impairments - at baseline                                 General Comments: Pt more alert  this session but continues to be internally distracted and crying throughout session.      Exercises      General Comments        Pertinent Vitals/Pain Pain Assessment: Faces Faces Pain Scale: Hurts whole lot Pain Location: back and legs Pain Descriptors / Indicators: Discomfort;Grimacing;Crying Pain Intervention(s): Monitored during session;Repositioned    Home Living                      Prior Function            PT Goals (current goals can now be found in the care plan section) Acute Rehab PT Goals Patient Stated Goal: didn't state PT Goal Formulation: Patient unable to participate in goal setting Potential to Achieve Goals: Fair Progress towards PT goals: Progressing toward goals    Frequency    Min 5X/week      PT Plan Current plan remains appropriate    Co-evaluation              AM-PAC PT "6 Clicks" Mobility   Outcome Measure  Help needed turning from your back to your side while in a flat bed without using bedrails?: A Lot Help needed moving from lying on your back to sitting on the side of a flat bed without using bedrails?: A Lot Help needed moving to and from a bed to a chair (including a wheelchair)?: A Lot Help needed standing up from a chair using your arms (e.g., wheelchair or bedside chair)?: A Lot Help needed to walk in hospital room?: Total Help needed climbing 3-5 steps with a railing? : Total 6 Click Score: 10    End of Session Equipment Utilized During Treatment: Gait belt Activity Tolerance: Patient limited by pain;Patient limited by lethargy Patient left: with call bell/phone within reach;in chair;with chair alarm set (utilized sitter alarm belt) Nurse Communication: Mobility status;Need for lift equipment (use maximove for back to bed.) PT Visit Diagnosis: Unsteadiness on feet (R26.81);Pain;Other abnormalities of gait and mobility (R26.89);Muscle weakness (generalized) (M62.81) Pain - Right/Left: Right     Time:  2694-8546 PT Time Calculation (min) (ACUTE ONLY): 31 min  Charges:  $Therapeutic Activity: 23-37 mins                     Erasmo Leventhal , PTA Acute Rehabilitation Services Pager [phone] Office [phone]     Antiono Ettinger Eli Hose 02/03/2021, 3:41 PM .

## 2021-02-03 NOTE — Plan of Care (Signed)

## 2021-02-03 NOTE — Progress Notes (Signed)
Providing Compassionate, Quality Care - Together   Subjective: Patient is crying out. She denies discomfort, but is having trouble repositioning herself in bed.  Objective: Vital signs in last 24 hours: Temp:  [98.1 F (36.7 C)-98.8 F (37.1 C)] 98.7 F (37.1 C) (04/14 0741) Pulse Rate:  [91-100] 97 (04/14 0741) Resp:  [16-17] 16 (04/14 0505) BP: (130-145)/(66-77) 145/72 (04/14 0741) SpO2:  [94 %-99 %] 99 % (04/14 0741) Weight:  [69.1 kg-71.3 kg] 69.1 kg (04/14 0741)  Intake/Output from previous day: No intake/output data recorded. Intake/Output this shift: No intake/output data recorded.  Alert PERRLA MAE, Strengthappears intact  Lab Results: Recent Labs    02/02/21 1901  WBC 7.4  HGB 8.6*  HCT 26.2*  PLT 361   BMET Recent Labs    02/02/21 1901  NA 134*  K 3.4*  CL 96*  CO2 22  GLUCOSE 70  BUN 53*  CREATININE 7.65*  CALCIUM 7.3*    Studies/Results: IR Fluoro Guide CV Line Right  Result Date: 02/01/2021 CLINICAL DATA:  Discitis, needs durable venous access for treatment regimen EXAM: TUNNELED CENTRAL VENOUS CATHETER PLACEMENT WITH ULTRASOUND AND FLUOROSCOPIC GUIDANCE TECHNIQUE: The procedure, risks, benefits, and alternatives were explained to the family. Questions regarding the procedure were encouraged and answered. The family understands and consents to the procedure. Patency of the right EJ vein was confirmed with ultrasound with image documentation. An appropriate skin site was determined. Region was prepped using maximum barrier technique including cap and mask, sterile gown, sterile gloves, large sterile sheet, and Chlorhexidine as cutaneous antisepsis. The region was infiltrated locally with 1% lidocaine. Under real-time ultrasound guidance, the right EJ vein was accessed with a 21 gauge micropuncture needle; the needle tip within the vein was confirmed with ultrasound image documentation. 68F dual-lumen cuffed PowerLine tunneled from a right anterior  chest wall approach to the dermatotomy site. Needle exchanged over the 018 guidewire for transitional dilator, through which the catheter which had been cut to appropriate length was advanced under intermittent fluoroscopy, positioned with its tip at the cavoatrial junction. Spot chest radiograph confirms good catheter position. No pneumothorax. Catheter was flushed per protocol. Catheter secured externally with O Prolene suture. The right EJ dermatotomy site was closed with Dermabond. COMPLICATIONS: COMPLICATIONS None immediate FLUOROSCOPY TIME:  12 seconds; 3 mGy COMPARISON:  None IMPRESSION: 1. Technically successful placement of tunneled right EJ tunneled dual-lumen power injectable catheter with ultrasound and fluoroscopic guidance. Ready for routine use. Electronically Signed   By: Lucrezia Europe M.D.   On: 02/01/2021 15:29   IR US Guide Vasc Access Right  Result Date: 02/01/2021 CLINICAL DATA:  Discitis, needs durable venous access for treatment regimen EXAM: TUNNELED CENTRAL VENOUS CATHETER PLACEMENT WITH ULTRASOUND AND FLUOROSCOPIC GUIDANCE TECHNIQUE: The procedure, risks, benefits, and alternatives were explained to the family. Questions regarding the procedure were encouraged and answered. The family understands and consents to the procedure. Patency of the right EJ vein was confirmed with ultrasound with image documentation. An appropriate skin site was determined. Region was prepped using maximum barrier technique including cap and mask, sterile gown, sterile gloves, large sterile sheet, and Chlorhexidine as cutaneous antisepsis. The region was infiltrated locally with 1% lidocaine. Under real-time ultrasound guidance, the right EJ vein was accessed with a 21 gauge micropuncture needle; the needle tip within the vein was confirmed with ultrasound image documentation. 68F dual-lumen cuffed PowerLine tunneled from a right anterior chest wall approach to the dermatotomy site. Needle exchanged over the 018  guidewire for transitional  dilator, through which the catheter which had been cut to appropriate length was advanced under intermittent fluoroscopy, positioned with its tip at the cavoatrial junction. Spot chest radiograph confirms good catheter position. No pneumothorax. Catheter was flushed per protocol. Catheter secured externally with O Prolene suture. The right EJ dermatotomy site was closed with Dermabond. COMPLICATIONS: COMPLICATIONS None immediate FLUOROSCOPY TIME:  12 seconds; 3 mGy COMPARISON:  None IMPRESSION: 1. Technically successful placement of tunneled right EJ tunneled dual-lumen power injectable catheter with ultrasound and fluoroscopic guidance. Ready for routine use. Electronically Signed   By: Lucrezia Europe M.D.   On: 02/01/2021 15:29   DG FLUORO GUIDED NEEDLE PLC ASPIRATION/INJECTION LOC  Result Date: 02/01/2021 CLINICAL DATA:  Right shoulder pain, demineralization along the joint surface is on radiography concerning for possible septic joint. EXAM: RIGHT SHOULDER ASPIRATION UNDER FLUOROSCOPY PROCEDURE: PROCEDURE I discussed the risks (including hemorrhage and infection, among others), benefits, and alternatives to the procedure with the patient's father, as the patient was not awake, aware, or alert. We specifically discussed the high technical likelihood of success of the procedure. The patient's father understood and elected for the patient to undergo the procedure. Standard time-out was employed. Following sterile skin prep and local anesthetic administration consisting of 1% lidocaine, an 18 gauge needle was advanced without difficulty into the right glenohumeral joint under fluoroscopic guidance. A total of 14 cc of serosanguineous synovial fluid was removed and sent to the lab. The needle was subsequently removed and the skin cleansed and bandaged. No immediate complications were observed. FLUOROSCOPY TIME:  Fluoroscopy Time:  0 minutes, 24 seconds Radiation Exposure Index (if provided  by the fluoroscopic device): 0.9 mGy Number of Acquired Spot Images: 0 IMPRESSION: Technically successful right glenohumeral joint aspiration under fluoroscopy. The 14 cc sample of joint fluid was sent to the laboratory. Electronically Signed   By: Van Clines M.D.   On: 02/01/2021 13:16    Assessment/Plan: Patient underwent L5-S1 decompressive laminectomy with evacuation of epidural abscess on 01/25/2021 by Dr. Annette Stable.Culture grew MSSE. Patient is on cefazolin for 6 week course. Right shoulder effusion noted on x-ray. Ortho ordered an IR guided tap that was performed 02/01/2021. Fluid analysis with WBC of 16.5k. Gram stain without organisms.   LOS: 9 days    -Continue to mobilize as tolerated. -Pain control. -ABX per ID. Started on cefazolin 01/27/2021. Will need 6 weeks abx therapy(end date 03/09/2021). -Recommendation is for SNF at discharge    Viona Gilmore, [place], AGNP-C Nurse Practitioner  [facility] [place] [address], [place], [place] [address] P: [phone]    F: [phone]  02/03/2021, 8:54 AM

## 2021-02-03 NOTE — Progress Notes (Signed)
Subjective:   Patient was intermittently screaming and complaining of left shoulder pain.  Is difficult to understand  Antibiotics:  Anti-infectives (From admission, onward)   Start     Dose/Rate Route Frequency Ordered Stop   01/27/21 1600  ceFAZolin (ANCEF) IVPB 1 g/50 mL premix        1 g 100 mL/hr over 30 Minutes Intravenous Every 24 hours 01/27/21 1456 03/09/21 1559   01/26/21 0530  cefTRIAXone (ROCEPHIN) injection 2 g  Status:  Discontinued        2 g Intramuscular Every 24 hours 01/25/21 1230 01/25/21 1546   01/26/21 0530  cefTRIAXone (ROCEPHIN) 2 g in sodium chloride 0.9 % 100 mL IVPB  Status:  Discontinued        2 g 200 mL/hr over 30 Minutes Intravenous Every 24 hours 01/25/21 1546 01/25/21 1711   01/25/21 1730  cefTRIAXone (ROCEPHIN) 2 g in sodium chloride 0.9 % 100 mL IVPB  Status:  Discontinued        2 g 200 mL/hr over 30 Minutes Intravenous Every 12 hours 01/25/21 1711 01/27/21 1451   01/25/21 1600  vancomycin (VANCOREADY) IVPB 500 mg/100 mL        500 mg 100 mL/hr over 60 Minutes Intravenous  Once 01/25/21 1512 01/25/21 1813   01/25/21 1520  vancomycin variable dose per unstable renal function (pharmacist dosing)  Status:  Discontinued         Does not apply See admin instructions 01/25/21 1520 01/27/21 1451   01/25/21 0823  vancomycin (VANCOCIN) powder  Status:  Discontinued          As needed 01/25/21 0823 01/25/21 0850   01/25/21 0400  vancomycin (VANCOREADY) IVPB 1500 mg/300 mL        1,500 mg 150 mL/hr over 120 Minutes Intravenous  Once 01/25/21 0354 01/25/21 1020   01/25/21 0400  cefTRIAXone (ROCEPHIN) 2 g in sodium chloride 0.9 % 100 mL IVPB        2 g 200 mL/hr over 30 Minutes Intravenous  Once 01/25/21 0354 01/25/21 0554      Medications: Scheduled Meds: . acetaminophen  1,000 mg Oral TID  . benztropine  1 mg Oral QHS  . Chlorhexidine Gluconate Cloth  6 each Topical Daily  . cinacalcet  30 mg Oral Q supper  . darbepoetin (ARANESP)  injection - NON-DIALYSIS  100 mcg Subcutaneous Q Mon-1800  . diclofenac Sodium  2 g Topical QID  . divalproex  750 mg Oral BID  . docusate sodium  100 mg Oral BID  . feeding supplement  237 mL Oral BID BM  . FLUoxetine  20 mg Oral Daily  . gentamicin cream  1 application Topical Daily  . dianeal solution for CAPD/CCPD with heparin   Peritoneal Dialysis Q24H  . iron polysaccharides  150 mg Oral QODAY  . levETIRAcetam  500 mg Oral BID  . levothyroxine  50 mcg Oral Q breakfast  . lidocaine  1 patch Transdermal Daily  . losartan  25 mg Oral Daily  . multivitamin  1 tablet Oral Daily  . pantoprazole  40 mg Oral Daily  . risperiDONE  2 mg Oral QHS  . sevelamer carbonate  800 mg Oral BID WC  . sevelamer carbonate  800 mg Oral With snacks  . vitamin B-12  500 mcg Oral Daily   Continuous Infusions: .  ceFAZolin (ANCEF) IV 1 g (02/02/21 1632)   PRN Meds:.albuterol, bisacodyl, cyclobenzaprine, HYDROmorphone (DILAUDID) injection, lidocaine, LORazepam,  menthol-cetylpyridinium **OR** phenol, ondansetron **OR** ondansetron (ZOFRAN) IV, oxyCODONE, polyethylene glycol, sodium chloride flush    Objective: Weight change: 0.6 kg  Intake/Output Summary (Last 24 hours) at 02/03/2021 1339 Last data filed at 02/02/2021 1608 Gross per 24 hour  Intake 0 ml  Output --  Net 0 ml   Blood pressure (!) 145/72, pulse 97, temperature 98.7 F (37.1 C), temperature source Oral, resp. rate 16, height 5\' 6"  (1.676 m), weight 69.1 kg, SpO2 99 %. Temp:  [98.1 F (36.7 C)-98.7 F (37.1 C)] 98.7 F (37.1 C) (04/14 0741) Pulse Rate:  [91-100] 97 (04/14 0741) Resp:  [16] 16 (04/14 0505) BP: (130-145)/(66-72) 145/72 (04/14 0741) SpO2:  [94 %-99 %] 99 % (04/14 0741) Weight:  [69.1 kg-71.3 kg] 69.1 kg (04/14 0741)  Physical Exam: Physical Exam Constitutional:      Appearance: She is not diaphoretic.  HENT:     Head: Normocephalic and atraumatic.     Right Ear: External ear normal.     Left Ear: External ear  normal.     Mouth/Throat:     Pharynx: No oropharyngeal exudate.  Eyes:     General: No scleral icterus.    Conjunctiva/sclera: Conjunctivae normal.     Pupils: Pupils are equal, round, and reactive to light.  Cardiovascular:     Rate and Rhythm: Normal rate and regular rhythm.  Pulmonary:     Effort: Pulmonary effort is normal. No respiratory distress.     Breath sounds: No wheezing or rales.  Abdominal:     General: Bowel sounds are normal. There is no distension.     Palpations: Abdomen is soft.     Tenderness: There is no abdominal tenderness. There is no rebound.  Musculoskeletal:     Right shoulder: Tenderness present. Decreased range of motion.  Lymphadenopathy:     Cervical: No cervical adenopathy.  Skin:    General: Skin is warm and dry.     Coloration: Skin is not pale.     Findings: No erythema or rash.  Neurological:     Mental Status: She is disoriented.     Motor: No abnormal muscle tone.     Coordination: Coordination normal.  Psychiatric:        Mood and Affect: Mood is anxious.        Thought Content: Thought content normal.        Judgment: Judgment normal.      CBC:    BMET Recent Labs    02/02/21 1901  NA 134*  K 3.4*  CL 96*  CO2 22  GLUCOSE 70  BUN 53*  CREATININE 7.65*  CALCIUM 7.3*     Liver Panel  Recent Labs    02/03/21 0829  PROT 5.6*  ALBUMIN 1.3*  AST 14*  ALT <5  ALKPHOS 267*  BILITOT 0.4  BILIDIR <0.1  IBILI NOT CALCULATED       Sedimentation Rate No results for input(s): ESRSEDRATE in the last 72 hours. C-Reactive Protein No results for input(s): CRP in the last 72 hours.  Micro Results: Recent Results (from the past 720 hour(s))  Blood culture (routine x 2)     Status: None   Collection Time: 01/25/21  4:37 AM   Specimen: BLOOD  Result Value Ref Range Status   Specimen Description BLOOD RIGHT ANTECUBITAL  Final   Special Requests   Final    BOTTLES DRAWN AEROBIC AND ANAEROBIC Blood Culture adequate  volume   Culture   Final  NO GROWTH 5 DAYS Performed at [facility], [place] [address], [place], [place] [address]    Report Status 01/30/2021 FINAL  Final  Resp Panel by RT-PCR (Flu A&B, Covid) Nasopharyngeal Swab     Status: None   Collection Time: 01/25/21  4:37 AM   Specimen: Nasopharyngeal Swab; Nasopharyngeal(NP) swabs in vial transport medium  Result Value Ref Range Status   SARS Coronavirus 2 by RT PCR NEGATIVE NEGATIVE Final    Comment: (NOTE) SARS-CoV-2 target nucleic acids are NOT DETECTED.  The SARS-CoV-2 RNA is generally detectable in upper respiratory specimens during the acute phase of infection. The lowest concentration of SARS-CoV-2 viral copies this assay can detect is 138 copies/mL. A negative result does not preclude SARS-Cov-2 infection and should not be used as the sole basis for treatment or other patient management decisions. A negative result may occur with  improper specimen collection/handling, submission of specimen other than nasopharyngeal swab, presence of viral mutation(s) within the areas targeted by this assay, and inadequate number of viral copies(<138 copies/mL). A negative result must be combined with clinical observations, patient history, and epidemiological information. The expected result is Negative.  Fact Sheet for Patients:  EntrepreneurPulse.com.au  Fact Sheet for Healthcare Providers:  IncredibleEmployment.be  This test is no t yet approved or cleared by the [place] FDA and  has been authorized for detection and/or diagnosis of SARS-CoV-2 by FDA under an Emergency Use Authorization (EUA). This EUA will remain  in effect (meaning this test can be used) for the duration of the COVID-19 declaration under Section 564(b)(1) of the Act, 21 U.S.C.section 360bbb-3(b)(1), unless the authorization is terminated  or revoked sooner.       Influenza A by PCR NEGATIVE NEGATIVE Final   Influenza  B by PCR NEGATIVE NEGATIVE Final    Comment: (NOTE) The Xpert Xpress SARS-CoV-2/FLU/RSV plus assay is intended as an aid in the diagnosis of influenza from Nasopharyngeal swab specimens and should not be used as a sole basis for treatment. Nasal washings and aspirates are unacceptable for Xpert Xpress SARS-CoV-2/FLU/RSV testing.  Fact Sheet for Patients: EntrepreneurPulse.com.au  Fact Sheet for Healthcare Providers: IncredibleEmployment.be  This test is not yet approved or cleared by the [place] FDA and has been authorized for detection and/or diagnosis of SARS-CoV-2 by FDA under an Emergency Use Authorization (EUA). This EUA will remain in effect (meaning this test can be used) for the duration of the COVID-19 declaration under Section 564(b)(1) of the Act, 21 U.S.C. section 360bbb-3(b)(1), unless the authorization is terminated or revoked.  Performed at [facility], [place] [address] [place],  [address]   Blood culture (routine x 2)     Status: Abnormal   Collection Time: 01/25/21  4:38 AM   Specimen: BLOOD RIGHT HAND  Result Value Ref Range Status   Specimen Description BLOOD RIGHT HAND  Final   Special Requests   Final    BOTTLES DRAWN AEROBIC AND ANAEROBIC Blood Culture adequate volume   Culture  Setup Time   Final    GRAM POSITIVE COCCI AEROBIC BOTTLE ONLY CRITICAL RESULT CALLED TO, READ BACK BY AND VERIFIED WITH: PAM ROGERS, RN 01/30/2021 @0132  A.HUGHES    Culture (A)  Final    MICROCOCCUS LUTEUS/LYLAE THE SIGNIFICANCE OF ISOLATING THIS ORGANISM FROM A SINGLE SET OF BLOOD CULTURES WHEN MULTIPLE SETS ARE DRAWN IS UNCERTAIN. PLEASE NOTIFY THE MICROBIOLOGY DEPARTMENT WITHIN ONE WEEK IF SPECIATION AND SENSITIVITIES ARE REQUIRED. Performed at [facility], [place]  [address], [place], [place] [address]    Report Status 01/31/2021 FINAL  Final  Blood Culture ID Panel (Reflexed)     Status: None   Collection Time:  01/25/21  4:38 AM  Result Value Ref Range Status   Enterococcus faecalis NOT DETECTED NOT DETECTED Corrected   Enterococcus Faecium NOT DETECTED NOT DETECTED Corrected   Listeria monocytogenes NOT DETECTED NOT DETECTED Corrected   Staphylococcus species NOT DETECTED NOT DETECTED Corrected   Staphylococcus aureus (BCID) NOT DETECTED NOT DETECTED Corrected   Staphylococcus epidermidis NOT DETECTED NOT DETECTED Corrected   Staphylococcus lugdunensis NOT DETECTED NOT DETECTED Corrected   Streptococcus species NOT DETECTED NOT DETECTED Corrected   Streptococcus agalactiae NOT DETECTED NOT DETECTED Corrected   Streptococcus pneumoniae NOT DETECTED NOT DETECTED Corrected   Streptococcus pyogenes NOT DETECTED NOT DETECTED Corrected   A.calcoaceticus-baumannii NOT DETECTED NOT DETECTED Corrected   Bacteroides fragilis NOT DETECTED NOT DETECTED Corrected   Enterobacterales NOT DETECTED NOT DETECTED Corrected   Enterobacter cloacae complex NOT DETECTED NOT DETECTED Corrected   Escherichia coli NOT DETECTED NOT DETECTED Corrected   Klebsiella aerogenes NOT DETECTED NOT DETECTED Corrected   Klebsiella oxytoca NOT DETECTED NOT DETECTED Corrected   Klebsiella pneumoniae NOT DETECTED NOT DETECTED Corrected   Proteus species NOT DETECTED NOT DETECTED Corrected   Salmonella species NOT DETECTED NOT DETECTED Corrected   Serratia marcescens NOT DETECTED NOT DETECTED Corrected   Haemophilus influenzae NOT DETECTED NOT DETECTED Corrected   Neisseria meningitidis NOT DETECTED NOT DETECTED Corrected   Pseudomonas aeruginosa NOT DETECTED NOT DETECTED Corrected   Stenotrophomonas maltophilia NOT DETECTED NOT DETECTED Corrected   Candida albicans NOT DETECTED NOT DETECTED Corrected   Candida auris NOT DETECTED NOT DETECTED Corrected   Candida glabrata NOT DETECTED NOT DETECTED Corrected   Candida krusei NOT DETECTED NOT DETECTED Corrected   Candida parapsilosis NOT DETECTED NOT DETECTED Corrected   Candida  tropicalis NOT DETECTED NOT DETECTED Corrected   Cryptococcus neoformans/gattii NOT DETECTED NOT DETECTED Corrected    Comment: Performed at [facility], [address] [place], [place] [address]  Aerobic/Anaerobic Culture w Gram Stain (surgical/deep wound)     Status: None   Collection Time: 01/25/21  8:08 AM   Specimen: PATH Other; Body Fluid  Result Value Ref Range Status   Specimen Description ABSCESS  Final   Special Requests EPIDUAL ABSCESS SPEC A  Final   Gram Stain NO WBC SEEN NO ORGANISMS SEEN   Final   Culture   Final    RARE STAPHYLOCOCCUS EPIDERMIDIS NO ANAEROBES ISOLATED Performed at [facility], [address] [place], [place] [address]    Report Status 01/30/2021 FINAL  Final   Organism ID, Bacteria STAPHYLOCOCCUS EPIDERMIDIS  Final      Susceptibility   Staphylococcus epidermidis - MIC*    CIPROFLOXACIN <=0.5 SENSITIVE Sensitive     ERYTHROMYCIN >=8 RESISTANT Resistant     GENTAMICIN <=0.5 SENSITIVE Sensitive     OXACILLIN <=0.25 SENSITIVE Sensitive     TETRACYCLINE <=1 SENSITIVE Sensitive     VANCOMYCIN 1 SENSITIVE Sensitive     TRIMETH/SULFA <=10 SENSITIVE Sensitive     CLINDAMYCIN <=0.25 SENSITIVE Sensitive     RIFAMPIN <=0.5 SENSITIVE Sensitive     Inducible Clindamycin NEGATIVE Sensitive     * RARE STAPHYLOCOCCUS EPIDERMIDIS  Aerobic Culture w Gram Stain (superficial specimen)     Status: None   Collection Time: 01/25/21  8:14 AM   Specimen: Wound  Result Value Ref Range Status  Specimen Description WOUND  Final   Special Requests ABSC  Final   Gram Stain   Final    RARE WBC PRESENT,BOTH PMN AND MONONUCLEAR RARE GRAM POSITIVE COCCI IN PAIRS Performed at [facility], [place] [address], [place], [place] [address]    Culture FEW STAPHYLOCOCCUS EPIDERMIDIS  Final   Report Status 01/27/2021 FINAL  Final   Organism ID, Bacteria STAPHYLOCOCCUS EPIDERMIDIS  Final      Susceptibility   Staphylococcus epidermidis - MIC*    CIPROFLOXACIN  <=0.5 SENSITIVE Sensitive     ERYTHROMYCIN >=8 RESISTANT Resistant     GENTAMICIN <=0.5 SENSITIVE Sensitive     OXACILLIN <=0.25 SENSITIVE Sensitive     TETRACYCLINE <=1 SENSITIVE Sensitive     VANCOMYCIN 1 SENSITIVE Sensitive     TRIMETH/SULFA <=10 SENSITIVE Sensitive     CLINDAMYCIN <=0.25 SENSITIVE Sensitive     RIFAMPIN <=0.5 SENSITIVE Sensitive     Inducible Clindamycin NEGATIVE Sensitive     * FEW STAPHYLOCOCCUS EPIDERMIDIS  Culture, blood (Routine X 2) w Reflex to ID Panel     Status: None (Preliminary result)   Collection Time: 01/30/21  3:15 PM   Specimen: BLOOD LEFT HAND  Result Value Ref Range Status   Specimen Description BLOOD LEFT HAND  Final   Special Requests   Final    BOTTLES DRAWN AEROBIC ONLY Blood Culture adequate volume   Culture   Final    NO GROWTH 4 DAYS Performed at [facility], [address] [place], [place] [address]    Report Status PENDING  Incomplete  Body fluid culture w Gram Stain     Status: None (Preliminary result)   Collection Time: 02/01/21 12:21 PM   Specimen: PATH Cytology Misc. fluid; Synovial Fluid  Result Value Ref Range Status   Specimen Description FLUID RIGHT SHOULDER  Final   Special Requests NONE  Final   Gram Stain   Final    MODERATE WBC PRESENT, PREDOMINANTLY PMN NO ORGANISMS SEEN    Culture   Final    NO GROWTH 2 DAYS Performed at [facility], [address], [place], [place] [address]    Report Status PENDING  Incomplete    Studies/Results: No results found.    Assessment/Plan:  INTERVAL HISTORY: Patient continues to complain of shoulder pain   Principal Problem:   Discitis of lumbosacral region Active Problems:   Schizoaffective disorder (HCC)   RETARDATION, MENTAL NOS   Essential hypertension   End-stage renal disease on peritoneal dialysis (HCC)   Seizure disorder (HCC)   Pressure injury of skin    Jane [name] is a 55 y.o. female with ESRD sp renal transplant but  on HD  With MS  Staph epidermidis epidural abscess status post neurosurgery but also with shoulder pain and abnormally high white cell count in the shoulder joint.   #1  MSSA E epidural abscess --Continue cefazolin  #2 suspected septic shoulder right side  While 16,000 white cells is not remarkable in a person coming in with complaints of acute shoulder not on antibiotics this patient has been on systemic antibiotics for more than a week prior to the shoulder being aspirated  Would favor she have formal Orthopedic surgical washout of the joint  We will follow        LOS: 9 days   Alcide Evener 02/03/2021, 1:39 PM

## 2021-02-03 NOTE — Progress Notes (Signed)
Pt. Refused cpap at this time. 

## 2021-02-04 DIAGNOSIS — N2581 Secondary hyperparathyroidism of renal origin: Secondary | ICD-10-CM | POA: Diagnosis not present

## 2021-02-04 DIAGNOSIS — D631 Anemia in chronic kidney disease: Secondary | ICD-10-CM | POA: Diagnosis not present

## 2021-02-04 DIAGNOSIS — M4647 Discitis, unspecified, lumbosacral region: Secondary | ICD-10-CM | POA: Diagnosis not present

## 2021-02-04 DIAGNOSIS — F259 Schizoaffective disorder, unspecified: Secondary | ICD-10-CM | POA: Diagnosis not present

## 2021-02-04 DIAGNOSIS — N186 End stage renal disease: Secondary | ICD-10-CM | POA: Diagnosis not present

## 2021-02-04 DIAGNOSIS — M00011 Staphylococcal arthritis, right shoulder: Secondary | ICD-10-CM | POA: Diagnosis not present

## 2021-02-04 LAB — CULTURE, BLOOD (ROUTINE X 2)
Culture: NO GROWTH
Special Requests: ADEQUATE

## 2021-02-04 MED ORDER — DIVALPROEX SODIUM 250 MG PO DR TAB: 750.0000 mg | DELAYED_RELEASE_TABLET | Freq: Two times a day (BID) | ORAL | 0 refills | Status: DC

## 2021-02-04 MED ORDER — POLYETHYLENE GLYCOL 3350 17 G PO PACK
17.0000 g | PACK | Freq: Two times a day (BID) | ORAL | Status: DC
  Administered 2021-02-04: 17 g via ORAL
  Filled 2021-02-04: qty 1

## 2021-02-04 MED ORDER — CEFAZOLIN IV (FOR PTA / DISCHARGE USE ONLY): 1.0000 g | INTRAVENOUS | 0 refills | Status: DC

## 2021-02-04 MED ORDER — ENSURE ENLIVE PO LIQD: 237.0000 mL | Freq: Two times a day (BID) | ORAL | Status: DC

## 2021-02-04 MED ORDER — WHITE PETROLATUM EX OINT
TOPICAL_OINTMENT | CUTANEOUS | Status: AC
  Filled 2021-02-04: qty 28.35

## 2021-02-04 MED ORDER — BISACODYL 10 MG RE SUPP
10.0000 mg | Freq: Once | RECTAL | Status: AC
  Administered 2021-02-04: 10 mg via RECTAL
  Filled 2021-02-04: qty 1

## 2021-02-04 MED ORDER — BISACODYL 10 MG RE SUPP
10.0000 mg | Freq: Once | RECTAL | Status: DC
  Filled 2021-02-04: qty 1

## 2021-02-04 MED ORDER — POLYETHYLENE GLYCOL 3350 17 G PO PACK
17.0000 g | PACK | Freq: Two times a day (BID) | ORAL | 0 refills | Status: AC
Start: 2021-02-04 — End: 2021-03-06

## 2021-02-04 NOTE — Care Management Important Message (Signed)
Important Message  Patient Details  Name: Jane [name] MRN: [id number] Date of Birth: [birth year]   Medicare Important Message Given:  Yes - Important Message mailed due to current National Emergency   Verbal consent obtained due to current National Emergency  Relationship to patient: Self Contact Name: Yemaya Call Date: 02/04/21  Time: 1318 Phone: [phone] Outcome: No Answer/Busy Important Message mailed to: Patient address on file    [name] 02/04/2021, 1:19 PM

## 2021-02-04 NOTE — Plan of Care (Signed)
  Problem: Activity: Goal: Risk for activity intolerance will decrease Outcome: Progressing   Problem: Nutrition: Goal: Adequate nutrition will be maintained Outcome: Progressing   

## 2021-02-04 NOTE — Progress Notes (Cosign Needed)
    Durable Medical Equipment  (From admission, onward)         Start     Ordered   02/04/21 1547  For home use only DME 3 n 1  Once        02/04/21 1546   02/04/21 1546  For home use only DME Walker rolling  Once       Question Answer Comment  Walker: With [place] Wheels   Patient needs a walker to treat with the following condition Discitis      02/04/21 1546   02/04/21 1545  For home use only DME Hospital bed  Once       Question Answer Comment  Length of Need Lifetime   Patient has (list medical condition): Hoyer lift   Bed type [employer] Lift Yes      02/04/21 1546   01/27/21 1259  For home use only DME lightweight manual wheelchair with seat cushion  Once       Comments: Patient suffers from s/p  L5-S1 laminectomy which impairs their ability to perform daily activities like walking in the home.  A rolling walker will not resolve  issue with performing activities of daily living. A wheelchair will allow patient to safely perform daily activities. Patient is not able to propel themselves in the home using a standard weight wheelchair due to weakness. Patient can self propel in the lightweight wheelchair. Length of need, 6 months. Accessories: elevating leg rests (ELRs), wheel locks, extensions and anti-tippers.   01/27/21 1312

## 2021-02-04 NOTE — Progress Notes (Signed)
Discharge package printed and instructions given to sister. Verbalizes understanding.

## 2021-02-04 NOTE — TOC Transition Note (Addendum)
Transition of Care [facility]) - CM/SW Discharge Note   Patient Details  Name: Jane [name] MRN: [id number] Date of Birth: [birth year]  [facility]) CM/SW Contact:  Sharin Mons, RN Phone Number: 02/04/2021, 5:27 PM   Clinical Narrative:    Patient will DC to: home  Anticipated DC date: 02/04/2021 Family notified: yes, [name] (sister) Transport by: car  Per MD patient ready for DC today. RN, patient, patient's family, and home health agencies ( [facility] and [facility]) notified of DC.  DME : hospital bed , hoyer lift, W/C noted...referral made with [facility]. Equipment (hospital bed Harrel Lemon lift) will be delivered to pt's home on 4/16, daughter made aware.Daughter states pt ok to d/c to home today without hospital bed. Declined rolling walker and BSC, W/C @ bedside with pt. Post hospital follow up appointments noted on AVS.  RNCM will sign off for now as intervention is no longer needed. Please consult [place] again if new needs arise.    Final next level of care: Home w Home Health Services Barriers to Discharge: No Barriers Identified   Patient Goals and CMS Choice   CMS Medicare.gov Compare Post Acute Care list provided to:: Patient Represenative (must comment) Choice offered to / list presented to : Sibling,Patient  Discharge Placement                       Discharge Plan and Services   Discharge Planning Services: CM Consult            DME Arranged: Hospital bed,hoyer lift DME Agency: [facility] Date DME Agency Contacted: 02/04/21 Time DME Agency Contacted: [phone] Representative spoke with at DME Agency: Freda Munro HH Arranged: RN   Date [place]: 01/28/21 Time [place]: 1515 Representative spoke with at [place]: [place] ([place]) Interventions     Readmission Risk Interventions No flowsheet data found.

## 2021-02-04 NOTE — TOC Progression Note (Addendum)
Transition of Care [facility]) - Progression Note    Patient Details  Name: Jane [name] MRN: [id number] Date of Birth: 1965/11/22  Transition of Care [facility]) CM/SW Contact  Milinda Antis, [place] Phone Number: 02/04/2021, 1:20 PM  Clinical Narrative:    CSW spoke with Cindie from [facility] and verified that they will be accepting the patient for [facility].  CSW informed Cindie that the doctor anticipates d/c today.  CM contacted Pam with [facility], verified services, and informed the agency of d/c today.  13:55- CSW has tried to contact family and left a VM informing them of patient's d/c today.   Expected Discharge Plan: [place] Barriers to Discharge: Barriers Resolved  Expected Discharge Plan and Services Expected Discharge Plan: [place]   Discharge Planning Services: CM Consult   Living arrangements for the past 2 months: Single Family Home                 DME Arranged: Other see comment,Wheelchair manual (IV antibiotic therapy/ [facility]; wheelchair/ [facility]) DME Agency: Other - Comment,[facility] ([facility]) Date DME Agency Contacted: 01/28/21 Time DME Agency Contacted: [phone] Representative spoke with at DME Agency: [place]: RN   Date [place]: 01/28/21 Time [place]: 1515 Representative spoke with at [place]: [place] ([place]) Interventions    Readmission Risk Interventions No flowsheet data found.

## 2021-02-04 NOTE — Progress Notes (Addendum)
Subjective:   Patient was intermittently screaming and complaining of left shoulder pain.  Is difficult to understand  Antibiotics:  Anti-infectives (From admission, onward)   Start     Dose/Rate Route Frequency Ordered Stop   02/04/21 0000  ceFAZolin (ANCEF) IVPB        1 g Intravenous Every 24 hours 02/04/21 1258 03/16/21 2359   01/27/21 1600  ceFAZolin (ANCEF) IVPB 1 g/50 mL premix        1 g 100 mL/hr over 30 Minutes Intravenous Every 24 hours 01/27/21 1456 03/09/21 1559   01/26/21 0530  cefTRIAXone (ROCEPHIN) injection 2 g  Status:  Discontinued        2 g Intramuscular Every 24 hours 01/25/21 1230 01/25/21 1546   01/26/21 0530  cefTRIAXone (ROCEPHIN) 2 g in sodium chloride 0.9 % 100 mL IVPB  Status:  Discontinued        2 g 200 mL/hr over 30 Minutes Intravenous Every 24 hours 01/25/21 1546 01/25/21 1711   01/25/21 1730  cefTRIAXone (ROCEPHIN) 2 g in sodium chloride 0.9 % 100 mL IVPB  Status:  Discontinued        2 g 200 mL/hr over 30 Minutes Intravenous Every 12 hours 01/25/21 1711 01/27/21 1451   01/25/21 1600  vancomycin (VANCOREADY) IVPB 500 mg/100 mL        500 mg 100 mL/hr over 60 Minutes Intravenous  Once 01/25/21 1512 01/25/21 1813   01/25/21 1520  vancomycin variable dose per unstable renal function (pharmacist dosing)  Status:  Discontinued         Does not apply See admin instructions 01/25/21 1520 01/27/21 1451   01/25/21 0823  vancomycin (VANCOCIN) powder  Status:  Discontinued          As needed 01/25/21 0823 01/25/21 0850   01/25/21 0400  vancomycin (VANCOREADY) IVPB 1500 mg/300 mL        1,500 mg 150 mL/hr over 120 Minutes Intravenous  Once 01/25/21 0354 01/25/21 1020   01/25/21 0400  cefTRIAXone (ROCEPHIN) 2 g in sodium chloride 0.9 % 100 mL IVPB        2 g 200 mL/hr over 30 Minutes Intravenous  Once 01/25/21 0354 01/25/21 0554      Medications: Scheduled Meds: . acetaminophen  1,000 mg Oral TID  . benztropine  1 mg Oral QHS  .  Chlorhexidine Gluconate Cloth  6 each Topical Daily  . cinacalcet  30 mg Oral Q supper  . darbepoetin (ARANESP) injection - NON-DIALYSIS  100 mcg Subcutaneous Q Mon-1800  . diclofenac Sodium  2 g Topical QID  . divalproex  750 mg Oral BID  . docusate sodium  100 mg Oral BID  . feeding supplement  237 mL Oral BID BM  . FLUoxetine  20 mg Oral Daily  . gentamicin cream  1 application Topical Daily  . dianeal solution for CAPD/CCPD with heparin   Peritoneal Dialysis Q24H  . iron polysaccharides  150 mg Oral QODAY  . levETIRAcetam  500 mg Oral BID  . levothyroxine  50 mcg Oral Q breakfast  . lidocaine  1 patch Transdermal Daily  . losartan  25 mg Oral Daily  . multivitamin  1 tablet Oral Daily  . pantoprazole  40 mg Oral Daily  . polyethylene glycol  17 g Oral BID  . risperiDONE  2 mg Oral QHS  . sevelamer carbonate  800 mg Oral BID WC  . sevelamer carbonate  800 mg Oral With  snacks  . vitamin B-12  500 mcg Oral Daily   Continuous Infusions: .  ceFAZolin (ANCEF) IV 1 g (02/03/21 1701)   PRN Meds:.albuterol, bisacodyl, cyclobenzaprine, dianeal solution for CAPD/CCPD with heparin, HYDROmorphone (DILAUDID) injection, lidocaine, LORazepam, menthol-cetylpyridinium **OR** phenol, ondansetron **OR** ondansetron (ZOFRAN) IV, oxyCODONE, sodium chloride flush    Objective: Weight change: -2.2 kg  Intake/Output Summary (Last 24 hours) at 02/04/2021 1654 Last data filed at 02/04/2021 0600 Gross per 24 hour  Intake 120 ml  Output --  Net 120 ml   Blood pressure 127/79, pulse 98, temperature 98 F (36.7 C), temperature source Oral, resp. rate 16, height 5\' 6"  (1.676 m), weight 58.9 kg, SpO2 100 %. Temp:  [97.6 F (36.4 C)-98.2 F (36.8 C)] 98 F (36.7 C) (04/15 0930) Pulse Rate:  [68-101] 98 (04/15 0858) Resp:  [16-18] 16 (04/15 0930) BP: (78-144)/(60-79) 127/79 (04/15 0930) SpO2:  [97 %-100 %] 100 % (04/15 0930) Weight:  [58.9 kg] 58.9 kg (04/15 0500)  Physical Exam: Physical  Exam Constitutional:      Appearance: She is not diaphoretic.  HENT:     Head: Normocephalic and atraumatic.     Right Ear: External ear normal.     Left Ear: External ear normal.     Mouth/Throat:     Pharynx: No oropharyngeal exudate.  Eyes:     General: No scleral icterus.    Conjunctiva/sclera: Conjunctivae normal.     Pupils: Pupils are equal, round, and reactive to light.  Cardiovascular:     Rate and Rhythm: Normal rate and regular rhythm.  Pulmonary:     Effort: Pulmonary effort is normal. No respiratory distress.     Breath sounds: No wheezing or rales.  Abdominal:     General: Bowel sounds are normal. There is no distension.     Palpations: Abdomen is soft.     Tenderness: There is no abdominal tenderness. There is no rebound.  Musculoskeletal:     Right shoulder: Tenderness present. Decreased range of motion.  Lymphadenopathy:     Cervical: No cervical adenopathy.  Skin:    General: Skin is warm and dry.     Coloration: Skin is not pale.     Findings: No erythema or rash.  Neurological:     Mental Status: She is disoriented.     Motor: No abnormal muscle tone.     Coordination: Coordination normal.  Psychiatric:        Mood and Affect: Mood is anxious.        Thought Content: Thought content normal.        Judgment: Judgment normal.      CBC:    BMET Recent Labs    02/02/21 1901  NA 134*  K 3.4*  CL 96*  CO2 22  GLUCOSE 70  BUN 53*  CREATININE 7.65*  CALCIUM 7.3*     Liver Panel  Recent Labs    02/03/21 0829  PROT 5.6*  ALBUMIN 1.3*  AST 14*  ALT <5  ALKPHOS 267*  BILITOT 0.4  BILIDIR <0.1  IBILI NOT CALCULATED       Sedimentation Rate No results for input(s): ESRSEDRATE in the last 72 hours. C-Reactive Protein No results for input(s): CRP in the last 72 hours.  Micro Results: Recent Results (from the past 720 hour(s))  Blood culture (routine x 2)     Status: None   Collection Time: 01/25/21  4:37 AM   Specimen: BLOOD   Result Value Ref Range Status  Specimen Description BLOOD RIGHT ANTECUBITAL  Final   Special Requests   Final    BOTTLES DRAWN AEROBIC AND ANAEROBIC Blood Culture adequate volume   Culture   Final    NO GROWTH 5 DAYS Performed at [facility], [address], [place], [place] [address]    Report Status 01/30/2021 FINAL  Final  Resp Panel by RT-PCR (Flu A&B, Covid) Nasopharyngeal Swab     Status: None   Collection Time: 01/25/21  4:37 AM   Specimen: Nasopharyngeal Swab; Nasopharyngeal(NP) swabs in vial transport medium  Result Value Ref Range Status   SARS Coronavirus 2 by RT PCR NEGATIVE NEGATIVE Final    Comment: (NOTE) SARS-CoV-2 target nucleic acids are NOT DETECTED.  The SARS-CoV-2 RNA is generally detectable in upper respiratory specimens during the acute phase of infection. The lowest concentration of SARS-CoV-2 viral copies this assay can detect is 138 copies/mL. A negative result does not preclude SARS-Cov-2 infection and should not be used as the sole basis for treatment or other patient management decisions. A negative result may occur with  improper specimen collection/handling, submission of specimen other than nasopharyngeal swab, presence of viral mutation(s) within the areas targeted by this assay, and inadequate number of viral copies(<138 copies/mL). A negative result must be combined with clinical observations, patient history, and epidemiological information. The expected result is Negative.  Fact Sheet for Patients:  EntrepreneurPulse.com.au  Fact Sheet for Healthcare Providers:  IncredibleEmployment.be  This test is no t yet approved or cleared by the [place] FDA and  has been authorized for detection and/or diagnosis of SARS-CoV-2 by FDA under an Emergency Use Authorization (EUA). This EUA will remain  in effect (meaning this test can be used) for the duration of the COVID-19 declaration under Section  564(b)(1) of the Act, 21 U.S.C.section 360bbb-3(b)(1), unless the authorization is terminated  or revoked sooner.       Influenza A by PCR NEGATIVE NEGATIVE Final   Influenza B by PCR NEGATIVE NEGATIVE Final    Comment: (NOTE) The Xpert Xpress SARS-CoV-2/FLU/RSV plus assay is intended as an aid in the diagnosis of influenza from Nasopharyngeal swab specimens and should not be used as a sole basis for treatment. Nasal washings and aspirates are unacceptable for Xpert Xpress SARS-CoV-2/FLU/RSV testing.  Fact Sheet for Patients: EntrepreneurPulse.com.au  Fact Sheet for Healthcare Providers: IncredibleEmployment.be  This test is not yet approved or cleared by the [place] FDA and has been authorized for detection and/or diagnosis of SARS-CoV-2 by FDA under an Emergency Use Authorization (EUA). This EUA will remain in effect (meaning this test can be used) for the duration of the COVID-19 declaration under Section 564(b)(1) of the Act, 21 U.S.C. section 360bbb-3(b)(1), unless the authorization is terminated or revoked.  Performed at [facility], [place] [address] [place], [place] [address]   Blood culture (routine x 2)     Status: Abnormal   Collection Time: 01/25/21  4:38 AM   Specimen: BLOOD RIGHT HAND  Result Value Ref Range Status   Specimen Description BLOOD RIGHT HAND  Final   Special Requests   Final    BOTTLES DRAWN AEROBIC AND ANAEROBIC Blood Culture adequate volume   Culture  Setup Time   Final    GRAM POSITIVE COCCI AEROBIC BOTTLE ONLY CRITICAL RESULT CALLED TO, READ BACK BY AND VERIFIED WITH: PAM ROGERS, RN 01/30/2021 @0132  A.HUGHES    Culture (A)  Final    MICROCOCCUS LUTEUS/LYLAE THE SIGNIFICANCE OF ISOLATING THIS ORGANISM FROM  A SINGLE SET OF BLOOD CULTURES WHEN MULTIPLE SETS ARE DRAWN IS UNCERTAIN. PLEASE NOTIFY THE MICROBIOLOGY DEPARTMENT WITHIN ONE WEEK IF SPECIATION AND SENSITIVITIES ARE REQUIRED. Performed at  [facility], [place] [address], [place], [place] [address]    Report Status 01/31/2021 FINAL  Final  Blood Culture ID Panel (Reflexed)     Status: None   Collection Time: 01/25/21  4:38 AM  Result Value Ref Range Status   Enterococcus faecalis NOT DETECTED NOT DETECTED Corrected   Enterococcus Faecium NOT DETECTED NOT DETECTED Corrected   Listeria monocytogenes NOT DETECTED NOT DETECTED Corrected   Staphylococcus species NOT DETECTED NOT DETECTED Corrected   Staphylococcus aureus (BCID) NOT DETECTED NOT DETECTED Corrected   Staphylococcus epidermidis NOT DETECTED NOT DETECTED Corrected   Staphylococcus lugdunensis NOT DETECTED NOT DETECTED Corrected   Streptococcus species NOT DETECTED NOT DETECTED Corrected   Streptococcus agalactiae NOT DETECTED NOT DETECTED Corrected   Streptococcus pneumoniae NOT DETECTED NOT DETECTED Corrected   Streptococcus pyogenes NOT DETECTED NOT DETECTED Corrected   A.calcoaceticus-baumannii NOT DETECTED NOT DETECTED Corrected   Bacteroides fragilis NOT DETECTED NOT DETECTED Corrected   Enterobacterales NOT DETECTED NOT DETECTED Corrected   Enterobacter cloacae complex NOT DETECTED NOT DETECTED Corrected   Escherichia coli NOT DETECTED NOT DETECTED Corrected   Klebsiella aerogenes NOT DETECTED NOT DETECTED Corrected   Klebsiella oxytoca NOT DETECTED NOT DETECTED Corrected   Klebsiella pneumoniae NOT DETECTED NOT DETECTED Corrected   Proteus species NOT DETECTED NOT DETECTED Corrected   Salmonella species NOT DETECTED NOT DETECTED Corrected   Serratia marcescens NOT DETECTED NOT DETECTED Corrected   Haemophilus influenzae NOT DETECTED NOT DETECTED Corrected   Neisseria meningitidis NOT DETECTED NOT DETECTED Corrected   Pseudomonas aeruginosa NOT DETECTED NOT DETECTED Corrected   Stenotrophomonas maltophilia NOT DETECTED NOT DETECTED Corrected   Candida albicans NOT DETECTED NOT DETECTED Corrected   Candida auris NOT DETECTED NOT DETECTED Corrected    Candida glabrata NOT DETECTED NOT DETECTED Corrected   Candida krusei NOT DETECTED NOT DETECTED Corrected   Candida parapsilosis NOT DETECTED NOT DETECTED Corrected   Candida tropicalis NOT DETECTED NOT DETECTED Corrected   Cryptococcus neoformans/gattii NOT DETECTED NOT DETECTED Corrected    Comment: Performed at [facility], [address] [place], [place] [address]  Aerobic/Anaerobic Culture w Gram Stain (surgical/deep wound)     Status: None   Collection Time: 01/25/21  8:08 AM   Specimen: PATH Other; Body Fluid  Result Value Ref Range Status   Specimen Description ABSCESS  Final   Special Requests EPIDUAL ABSCESS SPEC A  Final   Gram Stain NO WBC SEEN NO ORGANISMS SEEN   Final   Culture   Final    RARE STAPHYLOCOCCUS EPIDERMIDIS NO ANAEROBES ISOLATED Performed at [facility], [address], [place], [place] [address]    Report Status 01/30/2021 FINAL  Final   Organism ID, Bacteria STAPHYLOCOCCUS EPIDERMIDIS  Final      Susceptibility   Staphylococcus epidermidis - MIC*    CIPROFLOXACIN <=0.5 SENSITIVE Sensitive     ERYTHROMYCIN >=8 RESISTANT Resistant     GENTAMICIN <=0.5 SENSITIVE Sensitive     OXACILLIN <=0.25 SENSITIVE Sensitive     TETRACYCLINE <=1 SENSITIVE Sensitive     VANCOMYCIN 1 SENSITIVE Sensitive     TRIMETH/SULFA <=10 SENSITIVE Sensitive     CLINDAMYCIN <=0.25 SENSITIVE Sensitive     RIFAMPIN <=0.5 SENSITIVE Sensitive     Inducible Clindamycin NEGATIVE Sensitive     * RARE STAPHYLOCOCCUS  EPIDERMIDIS  Aerobic Culture w Gram Stain (superficial specimen)     Status: None   Collection Time: 01/25/21  8:14 AM   Specimen: Wound  Result Value Ref Range Status   Specimen Description WOUND  Final   Special Requests ABSC  Final   Gram Stain   Final    RARE WBC PRESENT,BOTH PMN AND MONONUCLEAR RARE GRAM POSITIVE COCCI IN PAIRS Performed at [facility], [address], [place], [place] [address]    Culture FEW STAPHYLOCOCCUS EPIDERMIDIS   Final   Report Status 01/27/2021 FINAL  Final   Organism ID, Bacteria STAPHYLOCOCCUS EPIDERMIDIS  Final      Susceptibility   Staphylococcus epidermidis - MIC*    CIPROFLOXACIN <=0.5 SENSITIVE Sensitive     ERYTHROMYCIN >=8 RESISTANT Resistant     GENTAMICIN <=0.5 SENSITIVE Sensitive     OXACILLIN <=0.25 SENSITIVE Sensitive     TETRACYCLINE <=1 SENSITIVE Sensitive     VANCOMYCIN 1 SENSITIVE Sensitive     TRIMETH/SULFA <=10 SENSITIVE Sensitive     CLINDAMYCIN <=0.25 SENSITIVE Sensitive     RIFAMPIN <=0.5 SENSITIVE Sensitive     Inducible Clindamycin NEGATIVE Sensitive     * FEW STAPHYLOCOCCUS EPIDERMIDIS  Culture, blood (Routine X 2) w Reflex to ID Panel     Status: None   Collection Time: 01/30/21  3:15 PM   Specimen: BLOOD LEFT HAND  Result Value Ref Range Status   Specimen Description BLOOD LEFT HAND  Final   Special Requests   Final    BOTTLES DRAWN AEROBIC ONLY Blood Culture adequate volume   Culture   Final    NO GROWTH 5 DAYS Performed at [facility], [address] [place], [place] [address]    Report Status 02/04/2021 FINAL  Final  Body fluid culture w Gram Stain     Status: None (Preliminary result)   Collection Time: 02/01/21 12:21 PM   Specimen: PATH Cytology Misc. fluid; Synovial Fluid  Result Value Ref Range Status   Specimen Description FLUID RIGHT SHOULDER  Final   Special Requests NONE  Final   Gram Stain   Final    MODERATE WBC PRESENT, PREDOMINANTLY PMN NO ORGANISMS SEEN    Culture   Final    NO GROWTH 3 DAYS Performed at [facility], [address] [place], [place] [address]    Report Status PENDING  Incomplete    Studies/Results: No results found.    Assessment/Plan:  INTERVAL HISTORY:   Know new problems   Principal Problem:   Discitis of lumbosacral region Active Problems:   Schizoaffective disorder (HCC)   RETARDATION, MENTAL NOS   Essential hypertension   End-stage renal disease on peritoneal dialysis (HCC)    Seizure disorder (HCC)   Pressure injury of skin    Jane [name] is a 55 y.o. female with ESRD sp renal transplant but  on HD  With MS Staph epidermidis epidural abscess status post neurosurgery but also with shoulder pain and abnormally high white cell count in the shoulder joint.   #1  MSSA E epidural abscess --Continue cefazolin  #2 suspected septic shoulder right side  Orthopedic surgery did not want to intervene.  This will need to be followed up clinically    Jane [name] has an appointment on 02/17/2021 with Dr. Linus Salmons via [place] for Infectious Disease is located in the [facility] at 88 AM with Dr. Linus Salmons  [address] [facility]  in [place].  [address], which is located to the left of the elevators.  Phone: [phone]  Fax: [phone]  [email or web address]         LOS: 10 days   Alcide Evener 02/04/2021, 4:54 PM

## 2021-02-04 NOTE — Discharge Summary (Signed)
Physician Discharge Summary  Jane [name] VFI:[id number] DOB: 1966-04-06 DOA: 01/25/2021  PCP: [name], MD  Admit date: 01/25/2021 Discharge date: 02/04/2021  Admitted From: Home Disposition: Home  Recommendations for Outpatient Follow-up:  1. Follow up with PCP in 1 week 2. Follow up with infectious disease 3. Follow up with neurosurgery in 2 weeks 4. IV antibiotics end date of 03/08/2021 5. Aggressive bowel regimen 6. Please follow up on the following pending results: Shoulder aspirate fluid final result  Home Health: RN Equipment/Devices: Hospital bed, hoyer lift, 3 in 1 commode  Discharge Condition: Stable CODE STATUS: Full code Diet recommendation: Renal diet  Brief/Interim Summary:  Admission HPI written by Karmen Bongo, MD   HPI: Jane [name] is a 55 y.o. female with medical history significant of intellectual disability; seizure d/o; schizophrenia/bipolar; hypothyroidism; HTN; HLD; and ESRD on PD presenting with back pain.  Patient was in PACU at the time of my evaluation and not able to answer significant questions.  I was unable to reach her sister by telephone.  In review of chart notes, she has had abdominal and back pain that has been worsening for about a month which was thought to be due to constipation.  +difficulty ambulating, increased falls.  She has had difficulty getting stools out of her rectum.  CT performed and showed severe discitis/osteomyelitis/epidural abscess and neurosurgery took her to the OR prior to my arrival this AM.    Hospital course:  MSSE epidural abscess/discitis Patient underwent successful urgent laminectomy by neurosurgery.  Wound culture significant for Staph epidermidis.  Initial blood cultures were significant for Micrococcus luteus/lylae, thought to be contaminant.  Repeat blood cultures from 4/10 with no growth. Tunneled right EJ placed on 4/12 by IR. -Infectious disease recommendations: Cefazolin IV x6 weeks,  renally dosed secondary to peritoneal dialysis with end date of 5/17  Right shoulder effusion Concern for septic arthritis.  Right shoulder pain arthrocentesis performed on 4/12. Fluid analysis with WBC of 16.5k with 95% neutrophils. Gram stain without organisms. Culture pending. Does not meet criteria for septic arthritis however possibly related to patient treatment with IV antibiotics. ID recommending orthopedic surgery consult for joint washout. Discussed with orthopedic surgery who decline washout secondary to joint fluid not meeting criteria for septic arthritis. Will need close follow-up of her right shoulder to ensure no worsening symptoms. Repeat imaging, MRI, needed if any worsening symptoms.  ESRD on PD Nephrology consulted and provided peritoneal dialysis nightly.  Chronic anemia of chronic kidney disease Iron deficiency anemia Acute on chronic anemia Baseline hemoglobin of about 7.5. Acute drop to 5.8 requiring 2 units of PRBC and possibly related to post-op anemia from perioperative blood loss. Hemoglobin currently stable.  Seizure disorder Continue Depakote (dose increased), Keppra  Schizoaffective disorder Patient currently baseline. Labile affect. Continue Cogentin, Prozac, Respirdal  Pressure injury Bilateral sacrum.  Present on admission.  Discharge Diagnoses:  Principal Problem:   Discitis of lumbosacral region Active Problems:   Schizoaffective disorder ([place])   RETARDATION, MENTAL NOS   Essential hypertension   End-stage renal disease on peritoneal dialysis ([place])   Seizure disorder ([place])   Pressure injury of skin    Discharge Instructions  Discharge Instructions    [facility] pharmacist to adjust dose for Vancomycin, Aminoglycosides and other anti-infective therapies as requested by physician.   Complete by: As directed    [facility] to provide Cath Flo 63m   Complete by: As directed    Administer for PICC line  occlusion and  as ordered by physician for other access device issues.   Anaphylaxis Kit: Provided to treat any anaphylactic reaction to the medication being provided to the patient if First Dose or when requested by physician   Complete by: As directed    Epinephrine 49m/ml vial / amp: Administer 0.365m(0.49m68msubcutaneously once for moderate to severe anaphylaxis, nurse to call physician and pharmacy when reaction occurs and call 911 if needed for immediate care   Diphenhydramine 45m67m IV vial: Administer 25-45mg70mIM PRN for first dose reaction, rash, itching, mild reaction, nurse to call physician and pharmacy when reaction occurs   Sodium Chloride 0.9% NS 500ml 55mAdminister if needed for hypovolemic blood pressure drop or as ordered by physician after call to physician with anaphylactic reaction   Change dressing on IV access line weekly and PRN   Complete by: As directed    Discharge wound care:   Complete by: As directed    Leave back wound to air; if draining, cover with gauze.   Flush IV access with Sodium Chloride 0.9% and Heparin 10 units/ml or 100 units/ml   Complete by: As directed    Home infusion instructions - [facility]   Complete by: As directed    Instructions: Flush IV access with Sodium Chloride 0.9% and Heparin 10units/ml or 100units/ml   Change dressing on IV access line: Weekly and PRN   Instructions Cath Flo 2mg: A47mnister for PICC Line occlusion and as ordered by physician for other access device   [facility] pharmacist to adjust dose for: Vancomycin, Aminoglycosides and other anti-infective therapies as requested by physician   Increase activity slowly   Complete by: As directed    Method of administration may be changed at the discretion of home infusion pharmacist based upon assessment of the patient and/or caregiver's ability to self-administer the medication ordered   Complete by: As directed      Allergies as of 02/04/2021      Reactions    Icodextrin Rash      Medication List    TAKE these medications   albuterol 108 (90 Base) MCG/ACT inhaler Commonly known as: VENTOLIN HFA Inhale 2 puffs into the lungs every 6 (six) hours as needed for wheezing or shortness of breath.   benztropine 1 MG tablet Commonly known as: COGENTIN Take 1 mg by mouth at bedtime.   ceFAZolin  IVPB Commonly known as: ANCEF Inject 1 g into the vein daily. Indication:  MSSE discitis  First Dose: Yes Last Day of Therapy:  03/08/2021 Labs - Once weekly:  CBC/D and BMP, Labs - Every other week:  ESR and CRP Method of administration: IV Push Method of administration may be changed at the discretion of home infusion pharmacist based upon assessment of the patient and/or caregiver's ability to self-administer the medication ordered.   cinacalcet 30 MG tablet Commonly known as: SENSIPAR Take 30-60 mg by mouth See admin instructions. Take 1 tablet (30 mg) by mouth with breakfast and 2 tablets (60 mg) with supper   Dialyvite 800 0.8 MG Tabs Take 0.8 mg by mouth daily.   diclofenac Sodium 1 % Gel Commonly known as: VOLTAREN Apply 2-4 g topically 4 (four) times daily as needed.   divalproex 250 MG DR tablet Commonly known as: DEPAKOTE Take 3 tablets (750 mg total) by mouth 2 (two) times daily. What changed:   medication strength  how much to take   DSS 100 MG Caps Take 100 mg by  mouth daily as needed (constipation).   feeding supplement Liqd Take 237 mLs by mouth 2 (two) times daily between meals. Start taking on: February 05, 2021   FLUoxetine 20 MG capsule Commonly known as: PROZAC Take 20 mg by mouth daily.   iron polysaccharides 150 MG capsule Commonly known as: NIFEREX Take 150 mg by mouth every other day.   levETIRAcetam 500 MG tablet Commonly known as: KEPPRA Take 1 tablet (500 mg total) by mouth 2 (two) times daily.   levothyroxine 50 MCG tablet Commonly known as: SYNTHROID Take 50 mcg by mouth daily with breakfast.    losartan 25 MG tablet Commonly known as: COZAAR Take 25 mg by mouth daily.   medroxyPROGESTERone 150 MG/ML injection Commonly known as: DEPO-PROVERA Inject 150 mg into the muscle every 3 (three) months.   omeprazole 40 MG capsule Commonly known as: PRILOSEC Take 40 mg by mouth daily.   polyethylene glycol 17 g packet Commonly known as: MIRALAX / GLYCOLAX Take 17 g by mouth 2 (two) times daily.   potassium chloride SA 20 MEQ tablet Commonly known as: KLOR-CON Take 20 mEq by mouth daily with breakfast.   risperiDONE 2 MG tablet Commonly known as: RISPERDAL Take 2 mg by mouth at bedtime.   sevelamer carbonate 800 MG tablet Commonly known as: RENVELA Take 800 mg by mouth See admin instructions. Take 1 tablet (800 mg) by mouth with meals and snacks   vitamin B-12 500 MCG tablet Commonly known as: CYANOCOBALAMIN Take 500 mcg by mouth daily.            Durable Medical Equipment  (From admission, onward)         Start     Ordered   01/27/21 1259  For home use only DME lightweight manual wheelchair with seat cushion  Once       Comments: Patient suffers from s/p  L5-S1 laminectomy which impairs their ability to perform daily activities like walking in the home.  A rolling walker will not resolve  issue with performing activities of daily living. A wheelchair will allow patient to safely perform daily activities. Patient is not able to propel themselves in the home using a standard weight wheelchair due to weakness. Patient can self propel in the lightweight wheelchair. Length of need, 6 months. Accessories: elevating leg rests (ELRs), wheel locks, extensions and anti-tippers.   01/27/21 1312           Discharge Care Instructions  (From admission, onward)         Start     Ordered   02/04/21 0000  Change dressing on IV access line weekly and PRN  (Home infusion instructions - [facility] )        02/04/21 1258   02/04/21 0000  Discharge wound care:        Comments: Leave back wound to air; if draining, cover with gauze.   02/04/21 1323          Follow-up Information    Comer, Okey Regal, MD Follow up.   Specialty: Infectious Diseases Why: 4/28 at 9:30 through telehealth or in person. Please call to reschedule if you are unable to make this appointment.  Contact information: [address] [address] [phone]        [facility] Follow up.   Specialty: Home Health Services Why: Home services will be provided by [facility], start of care within 24 hours of discharge Contact information: [address] [place] [address] [phone]        [facility] Follow up.   Why: IV antibiotic therapy will be provided by [facility] [phone]       Patricia Nettle, NP. Schedule an appointment as soon as possible for a visit in 1 week(s).   Specialty: Neurosurgery Why: Hospital follow-up for epidural abscess Contact information: [address] [place] [address] [phone]              Allergies  Allergen Reactions  . Icodextrin Rash    Consultations:  Infectious disease  Neurosurgery  Orthopedic surgery  Nephrology   Procedures/Studies: MR Lumbar Spine W Wo Contrast  Result Date: 01/25/2021 CLINICAL DATA:  56 year old female renal failure patient on peritoneal dialysis. Progressive increasing back pain over the past month, now severe. Newly unable to walk without assistance. Pain radiates to both legs. New difficulty moving bowels. EXAM: MRI LUMBAR SPINE WITHOUT AND WITH CONTRAST TECHNIQUE: Multiplanar and multiecho pulse sequences of the lumbar spine were obtained without and with intravenous contrast. CONTRAST:  7.66m GADAVIST GADOBUTROL 1 MMOL/ML IV SOLN COMPARISON:  Lumbar spine CT and CT Abdomen and Pelvis 0257 hours today. FINDINGS: Segmentation:  Normal on the comparison CT. Alignment:  Preserved lumbar lordosis, stable  from the earlier CT. Vertebrae: Diffusely abnormal T1 marrow signal in the visible spine and pelvis, most resembling renal osteodystrophy on the CTs earlier today. No marrow edema or acute osseous abnormality from L4 or above. Highly abnormal L5-S1 level.  See details below. Associated marrow edema and enhancement in the sacral ala greater on the left. No definite edema or infection involving the bilateral SI joints at this time. Conus medullaris and cauda equina: Conus extends to the L1 level. No definite lower spinal cord or conus signal abnormality. No abnormal intradural enhancement above L5. There is abnormal dural thickening and enhancement beginning at L4 and extending throughout the sacrum. See details below. Paraspinal and other soft tissues: Confluent presacral edema related to the abnormal L5-S1 level detailed below. But only mild L5-S1 paraspinal phlegmon at this time in addition to the abscesses detailed below. Stable abdominal viscera from the CT earlier today. Disc levels: Lower thoracic levels through L3-L4. Mild ordinary spine degeneration. L4-L5: Circumferential dural thickening and enhancement begins at this level. Subsequent mild spinal stenosis when combined with epidural lipomatosis and disc bulging. L5-S1: Abnormal rim enhancing fluid collection replacing the disc space and extruding anteriorly into the prevertebral space (series 18, image 10). The prevertebral abscess component extending from the lower L5 through the S2-S3 level encompasses 14 x 43 x 60 mm (AP by transverse by CC) for an estimated volume of 14 mL. Abscess tracks posteriorly through the disc space into the ventral epidural space (also series 18, image 10), and there are slightly discontinuous sacral epidural abscesses tracking to the S2-S3 level left greater than right. See series 20, image 35. Edema and enhancement within the L5 and S1 vertebral bodies both of which appear partially eroded as on CT. Obliterated spinal canal  from the superior L5 level caudally. Inflammation and stenosis affecting the bilateral L5 neural foramina. Inflammation in the S1 neural foramina. IMPRESSION: 1. Severe Discitis-Osteomyelitis at L5-S1 with bulky prevertebral and multifocal epidural abscesses. Prevertebral abscess component extending from L5-S3 estimated at 14 mL. Discontinuous L5 and sacral epidural abscesses with dural thickening result in severe spinal stenosis below the L4-L5 level. Bilateral L5 foraminal involvement. Mild stenosis at L4-L5 from dural thickening. 2. Comparatively mild  paraspinal soft tissue phlegmon at this time. Generalized presacral edema. Mild marrow edema in the sacral ala, but no definite SI joint infection at this time. 3. Underlying renal osteodystrophy, and stable abdominal viscera as seen by CT today. Electronically Signed   By: Genevie Ann M.D.   On: 01/25/2021 07:09   CT ABDOMEN PELVIS W CONTRAST  Result Date: 01/25/2021 CLINICAL DATA:  Acute nonlocalized abdominal pain. EXAM: CT ABDOMEN AND PELVIS WITH CONTRAST TECHNIQUE: Multidetector CT imaging of the abdomen and pelvis was performed using the standard protocol following bolus administration of intravenous contrast. CONTRAST:  15m OMNIPAQUE IOHEXOL 300 MG/ML  SOLN COMPARISON:  10/07/2017 FINDINGS: Lower chest: Lung bases are clear allowing for motion artifact. Hepatobiliary: Cholelithiasis with large stone in the gallbladder. Mild gallbladder distention without wall thickening. No bile duct dilatation. No focal liver lesions. Pancreas: Unremarkable. No pancreatic ductal dilatation or surrounding inflammatory changes. Spleen: 1 cm diameter somewhat poorly defined low-attenuation lesion in the spleen, nonspecific but probably representing a hemangioma. Adrenals/Urinary Tract: No adrenal gland nodules. Multiple subcentimeter cysts diffusely throughout the kidneys. 1.5 cm diameter exophytic lesion off of the lower pole of the right kidney laterally appears to  demonstrate contrast enhancement with washout on the delayed images, suggesting a small solid lesion, possibly adenoma or small renal cell carcinoma. MRI correlation may be useful for further evaluation. Multiple calcifications in both kidneys. No hydronephrosis or hydroureter. Bladder is normal. Stomach/Bowel: Stomach, small bowel, and colon are not abnormally distended. Stool fills the colon. No wall thickening or inflammatory changes. Appendix is not identified. Vascular/Lymphatic: Aortic atherosclerosis. No enlarged abdominal or pelvic lymph nodes. Reproductive: Uterus and bilateral adnexa are unremarkable. Other: Large amount of free fluid in the abdomen. Peritoneal dialysis catheter is present suggesting that this may represent dialysis fluid. Scarring in the anterior abdominal wall is likely postoperative. Musculoskeletal: Diffuse bone sclerosis likely representing renal osteodystrophy. Geographic sclerosis in the femoral heads may indicate avascular necrosis. IMPRESSION: 1. Cholelithiasis with large stone in the gallbladder. Mild gallbladder distention without wall thickening. 2. 1.5 cm diameter exophytic lesion off of the lower pole of the right kidney laterally with apparent contrast enhancement may represent a small solid lesion, possibly adenoma or small renal cell carcinoma. Hemorrhagic cyst would be a secondary possibility. MRI correlation may be useful for further evaluation. 3. Large amount of free fluid in the abdomen. Peritoneal dialysis catheter is present suggesting that this is likely to represent dialysis fluid. 4. Diffuse bone sclerosis likely representing renal osteodystrophy. Geographic sclerosis in the femoral heads may indicate avascular necrosis. 5. Aortic atherosclerosis. Aortic Atherosclerosis (ICD10-I70.0). Electronically Signed   By: WLucienne CapersM.D.   On: 01/25/2021 03:33   DG Lumbar Spine 1 View  Result Date: 01/25/2021 CLINICAL DATA:  Lumbar spine surgery. EXAM: LUMBAR  SPINE - 1 VIEW COMPARISON:  Lumbar spine numbered as per prior MRI. FINDINGS: Metallic marker noted posteriorly at the L5-S1 level. Prominent changes of discitis/osteomyelitis at L5-S1 again noted. IMPRESSION: 1.  Metallic marker noted posteriorly at the L5-S1 level. 2.  Prominent changes of L5-S1 discitis/osteomyelitis again noted. Electronically Signed   By: TMarcello Moores Register   On: 01/25/2021 11:11   IR Fluoro Guide CV Line Right  Result Date: 02/01/2021 CLINICAL DATA:  Discitis, needs durable venous access for treatment regimen EXAM: TUNNELED CENTRAL VENOUS CATHETER PLACEMENT WITH ULTRASOUND AND FLUOROSCOPIC GUIDANCE TECHNIQUE: The procedure, risks, benefits, and alternatives were explained to the family. Questions regarding the procedure were encouraged and answered. The family understands and consents to  the procedure. Patency of the right EJ vein was confirmed with ultrasound with image documentation. An appropriate skin site was determined. Region was prepped using maximum barrier technique including cap and mask, sterile gown, sterile gloves, large sterile sheet, and Chlorhexidine as cutaneous antisepsis. The region was infiltrated locally with 1% lidocaine. Under real-time ultrasound guidance, the right EJ vein was accessed with a 21 gauge micropuncture needle; the needle tip within the vein was confirmed with ultrasound image documentation. 71F dual-lumen cuffed PowerLine tunneled from a right anterior chest wall approach to the dermatotomy site. Needle exchanged over the 018 guidewire for transitional dilator, through which the catheter which had been cut to appropriate length was advanced under intermittent fluoroscopy, positioned with its tip at the cavoatrial junction. Spot chest radiograph confirms good catheter position. No pneumothorax. Catheter was flushed per protocol. Catheter secured externally with O Prolene suture. The right EJ dermatotomy site was closed with Dermabond. COMPLICATIONS:  COMPLICATIONS None immediate FLUOROSCOPY TIME:  12 seconds; 3 mGy COMPARISON:  None IMPRESSION: 1. Technically successful placement of tunneled right EJ tunneled dual-lumen power injectable catheter with ultrasound and fluoroscopic guidance. Ready for routine use. Electronically Signed   By: Lucrezia Europe M.D.   On: 02/01/2021 15:29   IR US Guide Vasc Access Right  Result Date: 02/01/2021 CLINICAL DATA:  Discitis, needs durable venous access for treatment regimen EXAM: TUNNELED CENTRAL VENOUS CATHETER PLACEMENT WITH ULTRASOUND AND FLUOROSCOPIC GUIDANCE TECHNIQUE: The procedure, risks, benefits, and alternatives were explained to the family. Questions regarding the procedure were encouraged and answered. The family understands and consents to the procedure. Patency of the right EJ vein was confirmed with ultrasound with image documentation. An appropriate skin site was determined. Region was prepped using maximum barrier technique including cap and mask, sterile gown, sterile gloves, large sterile sheet, and Chlorhexidine as cutaneous antisepsis. The region was infiltrated locally with 1% lidocaine. Under real-time ultrasound guidance, the right EJ vein was accessed with a 21 gauge micropuncture needle; the needle tip within the vein was confirmed with ultrasound image documentation. 71F dual-lumen cuffed PowerLine tunneled from a right anterior chest wall approach to the dermatotomy site. Needle exchanged over the 018 guidewire for transitional dilator, through which the catheter which had been cut to appropriate length was advanced under intermittent fluoroscopy, positioned with its tip at the cavoatrial junction. Spot chest radiograph confirms good catheter position. No pneumothorax. Catheter was flushed per protocol. Catheter secured externally with O Prolene suture. The right EJ dermatotomy site was closed with Dermabond. COMPLICATIONS: COMPLICATIONS None immediate FLUOROSCOPY TIME:  12 seconds; 3 mGy  COMPARISON:  None IMPRESSION: 1. Technically successful placement of tunneled right EJ tunneled dual-lumen power injectable catheter with ultrasound and fluoroscopic guidance. Ready for routine use. Electronically Signed   By: Lucrezia Europe M.D.   On: 02/01/2021 15:29   CT L-SPINE NO CHARGE  Result Date: 01/25/2021 CLINICAL DATA:  Initial evaluation for acute lower back pain. EXAM: CT LUMBAR SPINE WITHOUT CONTRAST TECHNIQUE: Multidetector CT imaging of the lumbar spine was performed without intravenous contrast administration. Multiplanar CT image reconstructions were also generated. COMPARISON:  Prior CT from 10/07/2017. FINDINGS: Segmentation: Standard. Lowest well-formed disc space labeled the L5-S1 level. Alignment: Physiologic with preservation of the normal lumbar lordosis. No listhesis. Vertebrae: There is abnormal disc space widening with endplate irregularity and erosion about the L5-S1 interspace, concerning for possible osteomyelitis discitis. Erosive changes extend inferiorly to involve the S1 and S2 segments of the sacrum. Fragmentation and lucency at the anterior aspect of  the sacrum suspicious for associated fracture (series 4, image 137). There is an irregular hypodense collection encompassing the L5-S1 disc space with inferior extension into the presacral space, concerning for abscess (series 14, image 40). Overall, this measures approximately 3.5 x 5.1 x 4.9 cm in maximal dimensions, although exact measurements are difficult (series 8, image 131). Additionally, there is abnormal enhancement with suspected epidural abscess within the ventral epidural space posterior to the L5-S1 level (series 14, image 41). The epidural component measures approximately 1.9 x 1.9 x 1.3 cm. Suspected moderate to severe spinal stenosis. Phlegmonous change extends into the bilateral neural foramina at this level. The L5-S1 facets are somewhat irregular and eroded, and may be involved as well. Possible involvement of the  right L4-5 facet noted as well. Elsewhere, bones are diffusely sclerotic and heterogeneous in appearance, presumably related to underlying renal osteodystrophy. Vertebral body height otherwise maintained with no other acute or chronic fracture. No other discrete or worrisome osseous lesions. SI joints are somewhat irregular and eroded bilaterally, similar to previous exam. Possible involvement of septic arthritis could also be contributory. Paraspinal and other soft tissues: Inflammatory stranding noted about the L5-S1 interspace, extending to involve the presacral space inferiorly. Chronic fatty atrophy noted within the posterior paraspinous musculature. Kidneys are atrophic with innumerable cysts bilaterally. Several complex enhancing lesions are seen as well, indeterminate (1.3 cm exophytic lesion at the upper pole the right kidney for example, series 8, image 40). Scattered nonobstructive nephrolithiasis noted bilaterally. No visible ureteral calculi or obstructive uropathy. Moderate aorto bi-iliac atherosclerotic disease noted. Cholelithiasis noted. Ascites partially visualized. Disc levels: L1-2:  Unremarkable. L2-3:  Negative interspace.  Minimal facet spurring.  No stenosis. L3-4: Mild disc bulge with bilateral facet hypertrophy. No significant spinal stenosis. Foramina remain patent. L4-5: Mild disc bulge. Moderate right worse than left facet hypertrophy with ligament flavum thickening. Possible changes of septic arthritis on the right. Moderate spinal stenosis. Mild bilateral L4 foraminal narrowing. L5-S1: Changes concerning for osteomyelitis discitis. Probable epidural phlegmon and/or abscess within the ventral epidural space. Possible associated/concomitant septic arthritis about the L5-S1 facets bilaterally. Suspected severe spinal stenosis with moderate to severe bilateral foraminal narrowing. IMPRESSION: 1. Findings concerning for osteomyelitis discitis at L5-S1 with approximate 3.5 x 5.1 x 4.9 cm  collection involving the disc space and adjacent paraspinous soft tissues. Associated ventral epidural abscess/phlegmon as above. Suspected severe spinal stenosis at this level with moderate to severe bilateral foraminal narrowing. Further assessment with dedicated MRI recommended for further evaluation. 2. Possible associated/concomitant septic arthritis about the L5-S1 and right L4-5 facets. This could also be assessed at MRI. 3. Superimposed lucency/fragmentation at the sacrum, consistent with an associated fracture. 4. Atrophic kidneys with innumerable cysts, consistent with end-stage renal disease. A few of these are complex in nature with possible enhancement, raising the possibility for renal neoplasm. Further evaluation with dedicated renal mass protocol MRI and/or CT recommended for further characterization. 5. Cholelithiasis. 6. Ascites. 7. Aortic Atherosclerosis (ICD10-I70.0). Results were communicated by telephone at the time of interpretation on 01/25/2021 at 3:47 am to provider Montine Circle , who verbally acknowledged these results. Electronically Signed   By: Jeannine Boga M.D.   On: 01/25/2021 03:54   DG Chest Port 1 View  Result Date: 01/25/2021 CLINICAL DATA:  Central line placement EXAM: PORTABLE CHEST 1 VIEW COMPARISON:  Portable exam 0903 hours compared to 04/21/2016 FINDINGS: RIGHT jugular central venous catheter with tip projecting over RIGHT atrium; consider withdrawal 3.5 cm to place tip at the cavoatrial junction.  Normal heart size, mediastinal contours, and pulmonary vascularity. Calcified lymph node AP window. Subsegmental atelectasis at both lung bases. No acute infiltrate, pleural effusion, or pneumothorax. IMPRESSION: No pneumothorax following central line placement. Consider withdrawal of RIGHT jugular line 3.5 cm to place tip at the cavoatrial junction. Electronically Signed   By: Lavonia Dana M.D.   On: 01/25/2021 10:47   DG Shoulder Right Port  Result Date:  01/29/2021 CLINICAL DATA:  Diminished range of motion involving the RIGHT shoulder. EXAM: PORTABLE RIGHT SHOULDER 3 VIEWS COMPARISON:  Visualized RIGHT shoulder on multiple prior chest x-rays. No prior RIGHT shoulder imaging. FINDINGS: Osseous demineralization. No evidence of acute fracture or glenohumeral dislocation. Osteolysis involving the distal clavicle. Acromioclavicular joint anatomically aligned. Widening of the subacromial space, likely indicating a joint effusion. IMPRESSION: 1. No acute osseous abnormality. 2. Suspected joint effusion. 3. Chronic osteolysis of the distal clavicle. Electronically Signed   By: Evangeline Dakin M.D.   On: 01/29/2021 13:20   DG FLUORO GUIDED NEEDLE PLC ASPIRATION/INJECTION LOC  Result Date: 02/01/2021 CLINICAL DATA:  Right shoulder pain, demineralization along the joint surface is on radiography concerning for possible septic joint. EXAM: RIGHT SHOULDER ASPIRATION UNDER FLUOROSCOPY PROCEDURE: PROCEDURE I discussed the risks (including hemorrhage and infection, among others), benefits, and alternatives to the procedure with the patient's father, as the patient was not awake, aware, or alert. We specifically discussed the high technical likelihood of success of the procedure. The patient's father understood and elected for the patient to undergo the procedure. Standard time-out was employed. Following sterile skin prep and local anesthetic administration consisting of 1% lidocaine, an 18 gauge needle was advanced without difficulty into the right glenohumeral joint under fluoroscopic guidance. A total of 14 cc of serosanguineous synovial fluid was removed and sent to the lab. The needle was subsequently removed and the skin cleansed and bandaged. No immediate complications were observed. FLUOROSCOPY TIME:  Fluoroscopy Time:  0 minutes, 24 seconds Radiation Exposure Index (if provided by the fluoroscopic device): 0.9 mGy Number of Acquired Spot Images: 0 IMPRESSION:  Technically successful right glenohumeral joint aspiration under fluoroscopy. The 14 cc sample of joint fluid was sent to the laboratory. Electronically Signed   By: Van Clines M.D.   On: 02/01/2021 13:16      Subjective: No issues today. Less agitated with sister here.  Discharge Exam: Vitals:   02/04/21 0503 02/04/21 0858  BP: (!) 141/79 (!) 144/77  Pulse: 92 98  Resp: 16 16  Temp: 97.6 F (36.4 C) 97.7 F (36.5 C)  SpO2: 98% 97%   Vitals:   02/03/21 2041 02/04/21 0500 02/04/21 0503 02/04/21 0858  BP: 135/68  (!) 141/79 (!) 144/77  Pulse: (!) 101  92 98  Resp: _0 Temp: 98.2 F (36.8 C)  97.6 F (36.4 C) 97.7 F (36.5 C)  TempSrc: Axillary  Oral Oral  SpO2: 97%  98% 97%  Weight:  58.9 kg    Height:        General: Pt is alert, awake, not in acute distress Cardiovascular: RRR, S1/S2 +, no rubs, no gallops Respiratory: CTA bilaterally, no wheezing, no rhonchi Abdominal: Soft, NT, mildly distended, bowel sounds + Extremities: no edema, no cyanosis    The results of significant diagnostics from this hospitalization (including imaging, microbiology, ancillary and laboratory) are listed below for reference.     Microbiology: Recent Results (from the past 240 hour(s))  Culture, blood (Routine X 2) w Reflex to ID Panel  Status: None   Collection Time: 01/30/21  3:15 PM   Specimen: BLOOD LEFT HAND  Result Value Ref Range Status   Specimen Description BLOOD LEFT HAND  Final   Special Requests   Final    BOTTLES DRAWN AEROBIC ONLY Blood Culture adequate volume   Culture   Final    NO GROWTH 5 DAYS Performed at [facility], [address] [place], [place] [address]    Report Status 02/04/2021 FINAL  Final  Body fluid culture w Gram Stain     Status: None (Preliminary result)   Collection Time: 02/01/21 12:21 PM   Specimen: PATH Cytology Misc. fluid; Synovial Fluid  Result Value Ref Range Status   Specimen Description FLUID RIGHT SHOULDER   Final   Special Requests NONE  Final   Gram Stain   Final    MODERATE WBC PRESENT, PREDOMINANTLY PMN NO ORGANISMS SEEN    Culture   Final    NO GROWTH 3 DAYS Performed at [facility], [address] [place],  [address]    Report Status PENDING  Incomplete     Labs: BNP (last 3 results) No results for input(s): BNP in the last 8760 hours. Basic Metabolic Panel: Recent Labs  Lab 02/02/21 1901  NA 134*  K 3.4*  CL 96*  CO2 22  GLUCOSE 70  BUN 53*  CREATININE 7.65*  CALCIUM 7.3*   Liver Function Tests: Recent Labs  Lab 02/03/21 0829  AST 14*  ALT <5  ALKPHOS 267*  BILITOT 0.4  PROT 5.6*  ALBUMIN 1.3*   No results for input(s): LIPASE, AMYLASE in the last 168 hours. No results for input(s): AMMONIA in the last 168 hours. CBC: Recent Labs  Lab 01/29/21 0403 01/30/21 0338 02/02/21 1901  WBC 8.8 9.3 7.4  HGB 8.5* 8.6* 8.6*  HCT 26.1* 27.0* 26.2*  MCV 93.9 95.1 95.3  PLT 357 345 361   Cardiac Enzymes: No results for input(s): CKTOTAL, CKMB, CKMBINDEX, TROPONINI in the last 168 hours. BNP: Invalid input(s): POCBNP CBG: No results for input(s): GLUCAP in the last 168 hours. D-Dimer No results for input(s): DDIMER in the last 72 hours. Hgb A1c No results for input(s): HGBA1C in the last 72 hours. Lipid Profile No results for input(s): CHOL, HDL, LDLCALC, TRIG, CHOLHDL, LDLDIRECT in the last 72 hours. Thyroid function studies No results for input(s): TSH, T4TOTAL, T3FREE, THYROIDAB in the last 72 hours.  Invalid input(s): FREET3 Anemia work up No results for input(s): VITAMINB12, FOLATE, FERRITIN, TIBC, IRON, RETICCTPCT in the last 72 hours. Urinalysis    Component Value Date/Time   COLORURINE YELLOW 10/07/2017 2158   APPEARANCEUR HAZY (A) 10/07/2017 2158   LABSPEC 1.005 10/07/2017 2158   PHURINE 8.0 10/07/2017 2158   GLUCOSEU NEGATIVE 10/07/2017 2158   HGBUR NEGATIVE 10/07/2017 2158   HGBUR trace-intact 04/12/2010 0904   BILIRUBINUR  NEGATIVE 10/07/2017 2158   KETONESUR NEGATIVE 10/07/2017 2158   PROTEINUR NEGATIVE 10/07/2017 2158   UROBILINOGEN 1.0 07/14/2012 2005   NITRITE NEGATIVE 10/07/2017 2158   LEUKOCYTESUR TRACE (A) 10/07/2017 2158   Sepsis Labs Invalid input(s): PROCALCITONIN,  WBC,  LACTICIDVEN Microbiology Recent Results (from the past 240 hour(s))  Culture, blood (Routine X 2) w Reflex to ID Panel     Status: None   Collection Time: 01/30/21  3:15 PM   Specimen: BLOOD LEFT HAND  Result Value Ref Range Status   Specimen Description BLOOD LEFT HAND  Final   Special Requests  Final    BOTTLES DRAWN AEROBIC ONLY Blood Culture adequate volume   Culture   Final    NO GROWTH 5 DAYS Performed at [facility], [place] [address] [place], [place] [address]    Report Status 02/04/2021 FINAL  Final  Body fluid culture w Gram Stain     Status: None (Preliminary result)   Collection Time: 02/01/21 12:21 PM   Specimen: PATH Cytology Misc. fluid; Synovial Fluid  Result Value Ref Range Status   Specimen Description FLUID RIGHT SHOULDER  Final   Special Requests NONE  Final   Gram Stain   Final    MODERATE WBC PRESENT, PREDOMINANTLY PMN NO ORGANISMS SEEN    Culture   Final    NO GROWTH 3 DAYS Performed at [facility], [address] [place], [place] [address]    Report Status PENDING  Incomplete     Time coordinating discharge: 35 minutes  SIGNED:   Cordelia Poche, MD [facility] 02/04/2021, 1:24 PM

## 2021-02-04 NOTE — Progress Notes (Signed)
Physical Therapy Treatment Patient Details Name: Jane [name] MRN: [id number] DOB: 05/10/1966 Today's Date: 02/04/2021    History of Present Illness The patient is a 55 y.o. year-old w/ hx of schizophrenia, seizure d/o, ESRD on PD, mental retardation (moderate severity), HTN, HL, hypothyroid, bipolar d/o, anemia presented yest to ED w/ abd pain, back pain and leg pain for 1 month. Pt is on home peritoneal dialysis for the last 6 yrs, f/[place] nephrology [place] W/U showed osteo/ discitis at L5-S1 w/ destructive changes of the anterior superior endplate of S1. Also large lateral epidural abscess causing significant compression of the thecal sac. Pt was taken to surgery for L5-S1 laminectomy and evacuation of epidural abscess.  Postop pt is on 5 N. Pt lives with sister who provides 24/7 care. Right shoulder arthrocentesis on 4/12.    PT Comments    Pt supine in bed.  Pt assisted to seated position and adjusted brace for accurate fit.  Utilized walking sling to minimize buckling in sara + frame.  Pt able to progress to gt for this first time this hospitalization with mechanical lift.  Plan for snf placement remains appropriate but family is refusing and plans to take patient home.      Follow Up Recommendations  SNF;Supervision/Assistance - 24 hour     Equipment Recommendations  Wheelchair cushion (measurements PT);Wheelchair (measurements PT);Rolling walker with 5" wheels;3in1 (PT);Hospital bed;Other (comment) (hoyer lift and sling pad for hoyer lift. will also need ambulance transport home.)    Recommendations for Other Services       Precautions / Restrictions Precautions Precautions: Back Precaution Booklet Issued: Yes (comment) Precaution Comments: spinal precautions Required Braces or Orthoses: Spinal Brace Spinal Brace: Lumbar corset;Applied in sitting position Restrictions Weight Bearing Restrictions: Yes Other Position/Activity Restrictions: No bending, lifting, twisting     Mobility  Bed Mobility Overal bed mobility: Needs Assistance Bed Mobility: Rolling;Sidelying to Sit Rolling: +2 for physical assistance;Min assist Sidelying to sit: Mod assist;+2 for physical assistance       General bed mobility comments: Mod +2 to elevated trunk into sitting.  pt continues to present with posterior pelvic tilt and pushing posterior into therapist supporting balance.  Applied brace in sitting this session and able to achieve more secure fit overall.    Transfers Overall transfer level: Needs assistance Equipment used: Ambulation equipment used (sara + with walking sling.) Transfers: Sit to/from Stand Sit to Stand: Total assist;+2 physical assistance (with sara +, assistance in addition to keep arms on B platforms.)         General transfer comment: Use of sara + for mechanical lift into standing.  Pt required cues for trunk control and knee extension in standing.  Pt unable to buckle this session with walking sling in place. Transfers more controlled this session  Ambulation/Gait Ambulation/Gait assistance: Max assist;+2 physical assistance;+2 safety/equipment (Pt in standing with aid of mechanical lift.  Once in standing able to progress steps forward during gt training.) Gait Distance (Feet): 25 Feet Assistive device:  (sara + sit to stand lift with foot plate removed for ambulation and walking sling in place.) Gait Pattern/deviations: Step-to pattern;Trunk flexed;Narrow base of support;Shuffle;Decreased stride length     General Gait Details: R hip ER with toe facing out.  Presents with flexed knees this session.  Pt was able to engage in gt training with close chair follow.  +2 to assistance at either side and 4th person to propel machine forward.   Stairs  Wheelchair Mobility    Modified Rankin (Stroke Patients Only)       Balance Overall balance assessment: Needs assistance Sitting-balance support: Bilateral upper extremity  supported Sitting balance-Leahy Scale: Poor Sitting balance - Comments: Pushes posteriorly     Standing balance-Leahy Scale: Poor Standing balance comment: pushing posterior                            Cognition Arousal/Alertness: Awake/alert Behavior During Therapy: Anxious;Restless Overall Cognitive Status: History of cognitive impairments - at baseline                                 General Comments: Pt more alert this session but continues to be internally distracted and crying throughout session.  Pt attempted to bite therapist this session at edge of bed.  Once up and walking she was more calm and engaged in session.      Exercises General Exercises - Lower Extremity Long Arc Quad: AROM;Both;10 reps;Seated;AAROM Hip Flexion/Marching: AROM;10 reps;Both;AAROM;Seated    General Comments        Pertinent Vitals/Pain Pain Assessment: Faces Faces Pain Scale: Hurts even more Pain Location: back and legs Pain Descriptors / Indicators: Discomfort;Grimacing;Crying (sister reports crying is baseline at home to get what she wants- so unclear how much pain she is truly in.) Pain Intervention(s): Monitored during session;Repositioned    Home Living                      Prior Function            PT Goals (current goals can now be found in the care plan section) Acute Rehab PT Goals Patient Stated Goal: didn't state Potential to Achieve Goals: Fair Progress towards PT goals: Progressing toward goals    Frequency    Min 5X/week      PT Plan Current plan remains appropriate    Co-evaluation              AM-PAC PT "6 Clicks" Mobility   Outcome Measure  Help needed turning from your back to your side while in a flat bed without using bedrails?: A Lot Help needed moving from lying on your back to sitting on the side of a flat bed without using bedrails?: A Lot Help needed moving to and from a bed to a chair (including a  wheelchair)?: A Lot Help needed standing up from a chair using your arms (e.g., wheelchair or bedside chair)?: A Lot Help needed to walk in hospital room?: Total Help needed climbing 3-5 steps with a railing? : Total 6 Click Score: 10    End of Session Equipment Utilized During Treatment: Gait belt Activity Tolerance: Patient limited by pain;Patient limited by lethargy Patient left: with call bell/phone within reach;in chair;with chair alarm set (utilized sitter alarm belt due to safety.) Nurse Communication: Mobility status;Need for lift equipment (use maximove for back to bed.) PT Visit Diagnosis: Unsteadiness on feet (R26.81);Pain;Other abnormalities of gait and mobility (R26.89);Muscle weakness (generalized) (M62.81) Pain - Right/Left: Right     Time: 6712-4580 PT Time Calculation (min) (ACUTE ONLY): 33 min  Charges:  $Gait Training: 8-22 mins $Therapeutic Activity: 8-22 mins                     Kaedence Connelly R. , PTA Acute Rehabilitation Services Pager [phone] Office [phone]     Phil Corti J  Stann Mainland 02/04/2021, 11:15 AM

## 2021-02-04 NOTE — Progress Notes (Signed)
[facility] Progress Note   Subjective:     Jane [name] was seen and examined today at bedside. Patient's sister was also at bedside. PD exchange completed this morning. Notified by HD RN, pulled off 1.3L and patient tolerated well. Of note, patient hasn't had BM since 01/28/21. Spoke with Hospitalist, order to receive Dulcolax 10mg  X 1 today. Patient also on Dulcolax PRN and Colace twice daily. Patient's sister reports this has been an ongoing issue at home. Otherwise, patient doing okay. Denies SOB, CP, ABD pain, and N/V. Plan for possible discharge today.  Objective Vitals:   02/03/21 2041 02/04/21 0500 02/04/21 0503 02/04/21 0858  BP: 135/68  (!) 141/79 (!) 144/77  Pulse: (!) 101  92 98  Resp: 18  16 16   Temp: 98.2 F (36.8 C)  97.6 F (36.4 C) 97.7 F (36.5 C)  TempSrc: Axillary  Oral Oral  SpO2: 97%  98% 97%  Weight:  58.9 kg    Height:       Physical Exam General: No acute respiratory distress Heart: Normal S1 and S2; No murmurs, gallops, or friction rub Lungs: Lungs clear throughout w/o wheezing, rales, or rhonchi Abdomen: Soft, non-tender, active bowel sounds Extremities: No edema bilateral lower extremities Dialysis Access: PD cath RUQ  Filed Weights   02/02/21 1652 02/03/21 0741 02/04/21 0500  Weight: 71.3 kg 69.1 kg 58.9 kg    Intake/Output Summary (Last 24 hours) at 02/04/2021 1011 Last data filed at 02/04/2021 0600 Gross per 24 hour  Intake 120 ml  Output --  Net 120 ml    Additional Objective Labs: Basic Metabolic Panel: Recent Labs  Lab 02/02/21 1901  NA 134*  K 3.4*  CL 96*  CO2 22  GLUCOSE 70  BUN 53*  CREATININE 7.65*  CALCIUM 7.3*   Liver Function Tests: Recent Labs  Lab 02/03/21 0829  AST 14*  ALT <5  ALKPHOS 267*  BILITOT 0.4  PROT 5.6*  ALBUMIN 1.3*   No results for input(s): LIPASE, AMYLASE in the last 168 hours. CBC: Recent Labs  Lab 01/29/21 0403 01/30/21 0338 02/02/21 1901  WBC 8.8 9.3 7.4  HGB 8.5*  8.6* 8.6*  HCT 26.1* 27.0* 26.2*  MCV 93.9 95.1 95.3  PLT 357 345 361   Blood Culture    Component Value Date/Time   SDES FLUID RIGHT SHOULDER 02/01/2021 1221   SPECREQUEST NONE 02/01/2021 1221   CULT  02/01/2021 1221    NO GROWTH 2 DAYS Performed at [facility], [place] [address], [place], [place] [address]    REPTSTATUS PENDING 02/01/2021 1221    Iron Studies: No results for input(s): IRON, TIBC, TRANSFERRIN, FERRITIN in the last 72 hours. Lab Results  Component Value Date   INR 1.10 10/07/2017   INR 1.22 09/14/2015   INR 1.21 07/14/2012   Studies/Results: No results found.  Medications: .  ceFAZolin (ANCEF) IV 1 g (02/03/21 1701)   . acetaminophen  1,000 mg Oral TID  . benztropine  1 mg Oral QHS  . bisacodyl  10 mg Rectal Once  . Chlorhexidine Gluconate Cloth  6 each Topical Daily  . cinacalcet  30 mg Oral Q supper  . darbepoetin (ARANESP) injection - NON-DIALYSIS  100 mcg Subcutaneous Q Mon-1800  . diclofenac Sodium  2 g Topical QID  . divalproex  750 mg Oral BID  . docusate sodium  100 mg Oral BID  . feeding supplement  237 mL Oral BID BM  . FLUoxetine  20 mg Oral  Daily  . gentamicin cream  1 application Topical Daily  . dianeal solution for CAPD/CCPD with heparin   Peritoneal Dialysis Q24H  . iron polysaccharides  150 mg Oral QODAY  . levETIRAcetam  500 mg Oral BID  . levothyroxine  50 mcg Oral Q breakfast  . lidocaine  1 patch Transdermal Daily  . losartan  25 mg Oral Daily  . multivitamin  1 tablet Oral Daily  . pantoprazole  40 mg Oral Daily  . polyethylene glycol  17 g Oral BID  . risperiDONE  2 mg Oral QHS  . sevelamer carbonate  800 mg Oral BID WC  . sevelamer carbonate  800 mg Oral With snacks  . vitamin B-12  500 mcg Oral Daily    Dialysis Orders: [facility] - followed by [facility] (Dr. Nancy Marus) 4 exchanges over night, 2L each. Dry wt 71.5kg  Assessment/Plan: 1. Discitis/ osteomyelitis/ epidural abscess: S/pL5 - S1 laminectomy +  epidural abscess evacuationon 01/25/21.BCx 4/5 1 of 2+ micrococcus - ID thinks contaminant,Wound Cx grew Staph Epi. S/p tunneled central line for abx. Will be on Cefazolinthru 5/17 (6 wks per ID). 2. ESRD: On CCPD - 4 exchanges, 2L volume. Pulled off 1/3L last night. PD nightly while here- needsheparin to all fluids due to visible fibrin. 3. HTN/volume: Last BP noted 144/77. No edema noted and pulled off 1.3L off last night. Continue to monitor BP trends.  4. Constipation: According to patient's sister, this has been an ongoing issue. Previously having difficulty with UF which the constipation may be the contributing factor. Patient to receive Dulcolax X 1 today. Continue Colace and Dulcolax PRN.  5. Schizophrenia/Bipolar: Per primary. 6. Seizuredisorder: Per primary. 7. Secondary HPTH: CorrCa and Phos ok - continue sevelamer + sensipar. 8. Anemiaof ESRD:Hgb 8.2->5.8 post-op. S/p 2U PRBCs on 4/7. Hgb 8.6. Aranesp started weekly. 9. Nutrition: Albumin very low, continue protein supplements. 10. R shoulder pain:S/p aspiration with IR, presumed septic arthritis. Per ID. 11. DISPO PLAN: Please let our team know as soon as possible if she needs to go to SNF at discharge rather than home. Most SNF will NOT do PD and therefore she may need to be converted to HD. Spoke with sister today who hopes patient can be discharged home . Patient is okay to go home from our standpoint. As stated above, let us know if she will be discharged to SNF.   Tobie Poet, NP [facility] 02/04/2021,10:11 AM  LOS: 10 days

## 2021-02-04 NOTE — Discharge Instructions (Signed)
Nikky HATLEY HENEGAR,  You were in the hospital with a back infection requiring surgery and antibiotics. You will discharge with IV antibiotics.

## 2021-02-04 NOTE — Evaluation (Signed)
Clinical/Bedside Swallow Evaluation Patient Details  Name: Jane [name] MRN: [id number] Date of Birth: 20-Dec-1965  Today's Date: 02/04/2021 Time: SLP Start Time (ACUTE ONLY): 1451 SLP Stop Time (ACUTE ONLY): 1507 SLP Time Calculation (min) (ACUTE ONLY): 15.13 min  Past Medical History:  Past Medical History:  Diagnosis Date  . Abnormal gait   . Anemia   . Bipolar disorder ([place])   . Depression   . ESRD on peritoneal dialysis ([place])   . GERD (gastroesophageal reflux disease)   . Hyperlipemia   . Hypertension   . Hypothyroidism   . Moderately mentally retarded   . Renal disease   . Schizophrenia ([place])   . Seizures ([place])    No recent seizures - no meds   Past Surgical History:  Past Surgical History:  Procedure Laterality Date  . BREAST REDUCTION SURGERY    . BREAST SURGERY     breast reduction  . EXAMINATION UNDER ANESTHESIA N/A 12/08/2013   Procedure: EXAM UNDER ANESTHESIA;  Surgeon: Lavonia Drafts, MD;  Location: [facility];  Service: Gynecology;  Laterality: N/A;  Pelvic exam and pap smear, unable to tolerate during last office visit  . FOOT SURGERY     right  . IR FLUORO GUIDE CV LINE RIGHT  02/01/2021  . IR US GUIDE VASC ACCESS RIGHT  02/01/2021  . LUMBAR LAMINECTOMY/DECOMPRESSION MICRODISCECTOMY N/A 01/25/2021   Procedure: Lumbar Five -Sacral One Laminectomy for Epidural Abscess;  Surgeon: Earnie Larsson, MD;  Location: [facility];  Service: Neurosurgery;  Laterality: N/A;  . REDUCTION MAMMAPLASTY Bilateral    HPI:  The patient is a 55 y.o. year-old w/ hx of schizophrenia, seizure d/o, ESRD on PD, intellectual disability, HTN, HL, hypothyroid, bipolar d/o, anemia presented yest to ED w/ abd pain, back pain and leg pain for 1 month. Pt is on home peritoneal dialysis for the last 6 yrs, f[place] nephrology in [place] W/U showed osteo/ discitis at L5-S1 w/ destructive changes of the anterior superior endplate of S1. Also large lateral epidural abscess causing significant compression of  the thecal sac. Pt was taken to surgery for L5-S1 laminectomy and evacuation of epidural abscess.  Postop pt is on 5 N. Pt lives with sister who provides 24/7 care. Right shoulder arthrocentesis on 4/12.   Assessment / Plan / Recommendation Clinical Impression  Only a limited clinical swallow evaluation could be completed.  Pt was drowsy and difficult to engage- she was willing to take only a single sip of water and one teaspoon of pudding, otherwise refusing and trying to cover her head with her blanket to sleep.  There were no overt deficits identified; no s/s of aspiration, but again this was in the context of minimal POs. Lung sounds appear clear per chart review; no recent CXR has been done.  RN reports no problems with eating as far as she is aware.  If further assessment is needed, please re-consult. It appears that pt may be D/Cd today.  Our service will sign off. SLP Visit Diagnosis: Dysphagia, unspecified (R13.10)    Aspiration Risk    unable to fully assess   Diet Recommendation   continue per pt's preferences  Medication Administration: Whole meds with puree    Other  Recommendations Oral Care Recommendations: Oral care BID   Follow up Recommendations None      Frequency and Duration            Prognosis        Swallow Study   General HPI: The patient is  a 55 y.o. year-old w/ hx of schizophrenia, seizure d/o, ESRD on PD, intellectual disability, HTN, HL, hypothyroid, bipolar d/o, anemia presented yest to ED w/ abd pain, back pain and leg pain for 1 month. Pt is on home peritoneal dialysis for the last 6 yrs, f/[place] nephrology in [place] W/U showed osteo/ discitis at L5-S1 w/ destructive changes of the anterior superior endplate of S1. Also large lateral epidural abscess causing significant compression of the thecal sac. Pt was taken to surgery for L5-S1 laminectomy and evacuation of epidural abscess.  Postop pt is on 5 N. Pt lives with sister who provides 24/7 care. Right shoulder  arthrocentesis on 4/12. Type of Study: Bedside Swallow Evaluation Previous Swallow Assessment: no Diet Prior to this Study: Regular;Thin liquids Temperature Spikes Noted: No Respiratory Status: Room air (CPAP at night) History of Recent Intubation: No Behavior/Cognition: Lethargic/Drowsy Oral Cavity Assessment: Within Functional Limits Oral Care Completed by SLP: No Vision: Functional for self-feeding Self-Feeding Abilities: Needs assist Patient Positioning: Upright in bed Baseline Vocal Quality: Normal Volitional Cough: Cognitively unable to elicit Volitional Swallow: Unable to elicit    Oral/Motor/Sensory Function Overall Oral Motor/Sensory Function: Other (comment) (face is symmetric at rest)   Ice Chips Ice chips: Not tested   Thin Liquid Thin Liquid: Within functional limits    Nectar Thick Nectar Thick Liquid: Not tested   Honey Thick Honey Thick Liquid: Not tested   Puree Puree: Within functional limits   Solid     Solid: Not tested      Juan Quam Laurice 02/04/2021,3:21 PM  Estill Bamberg L. Tivis Ringer, [place] Office number [phone] Pager [phone]

## 2021-02-05 DIAGNOSIS — N281 Cyst of kidney, acquired: Secondary | ICD-10-CM | POA: Diagnosis not present

## 2021-02-05 DIAGNOSIS — N186 End stage renal disease: Secondary | ICD-10-CM | POA: Diagnosis not present

## 2021-02-05 DIAGNOSIS — Z992 Dependence on renal dialysis: Secondary | ICD-10-CM | POA: Diagnosis not present

## 2021-02-05 DIAGNOSIS — D631 Anemia in chronic kidney disease: Secondary | ICD-10-CM | POA: Diagnosis not present

## 2021-02-05 DIAGNOSIS — K8020 Calculus of gallbladder without cholecystitis without obstruction: Secondary | ICD-10-CM | POA: Diagnosis not present

## 2021-02-05 DIAGNOSIS — G062 Extradural and subdural abscess, unspecified: Secondary | ICD-10-CM | POA: Diagnosis not present

## 2021-02-05 DIAGNOSIS — G40909 Epilepsy, unspecified, not intractable, without status epilepticus: Secondary | ICD-10-CM | POA: Diagnosis not present

## 2021-02-05 DIAGNOSIS — M48061 Spinal stenosis, lumbar region without neurogenic claudication: Secondary | ICD-10-CM | POA: Diagnosis not present

## 2021-02-05 DIAGNOSIS — F319 Bipolar disorder, unspecified: Secondary | ICD-10-CM | POA: Diagnosis not present

## 2021-02-05 DIAGNOSIS — Z9181 History of falling: Secondary | ICD-10-CM | POA: Diagnosis not present

## 2021-02-05 DIAGNOSIS — N2581 Secondary hyperparathyroidism of renal origin: Secondary | ICD-10-CM | POA: Diagnosis not present

## 2021-02-05 DIAGNOSIS — M4627 Osteomyelitis of vertebra, lumbosacral region: Secondary | ICD-10-CM | POA: Diagnosis not present

## 2021-02-05 DIAGNOSIS — F71 Moderate intellectual disabilities: Secondary | ICD-10-CM | POA: Diagnosis not present

## 2021-02-05 DIAGNOSIS — F209 Schizophrenia, unspecified: Secondary | ICD-10-CM | POA: Diagnosis not present

## 2021-02-05 DIAGNOSIS — I120 Hypertensive chronic kidney disease with stage 5 chronic kidney disease or end stage renal disease: Secondary | ICD-10-CM | POA: Diagnosis not present

## 2021-02-05 DIAGNOSIS — Z4789 Encounter for other orthopedic aftercare: Secondary | ICD-10-CM | POA: Diagnosis not present

## 2021-02-05 DIAGNOSIS — Z792 Long term (current) use of antibiotics: Secondary | ICD-10-CM | POA: Diagnosis not present

## 2021-02-05 DIAGNOSIS — M5126 Other intervertebral disc displacement, lumbar region: Secondary | ICD-10-CM | POA: Diagnosis not present

## 2021-02-05 DIAGNOSIS — K219 Gastro-esophageal reflux disease without esophagitis: Secondary | ICD-10-CM | POA: Diagnosis not present

## 2021-02-05 DIAGNOSIS — E785 Hyperlipidemia, unspecified: Secondary | ICD-10-CM | POA: Diagnosis not present

## 2021-02-05 DIAGNOSIS — Z452 Encounter for adjustment and management of vascular access device: Secondary | ICD-10-CM | POA: Diagnosis not present

## 2021-02-05 DIAGNOSIS — M4647 Discitis, unspecified, lumbosacral region: Secondary | ICD-10-CM | POA: Diagnosis not present

## 2021-02-05 DIAGNOSIS — I700 Atherosclerosis of aorta: Secondary | ICD-10-CM | POA: Diagnosis not present

## 2021-02-05 DIAGNOSIS — E039 Hypothyroidism, unspecified: Secondary | ICD-10-CM | POA: Diagnosis not present

## 2021-02-05 DIAGNOSIS — R188 Other ascites: Secondary | ICD-10-CM | POA: Diagnosis not present

## 2021-02-05 LAB — BODY FLUID CULTURE W GRAM STAIN: Culture: NO GROWTH

## 2021-02-05 LAB — QUANTIFERON-TB GOLD PLUS (RQFGPL)
QuantiFERON Mitogen Value: 0.3 IU/mL
QuantiFERON Nil Value: 0.11 IU/mL
QuantiFERON TB1 Ag Value: 0.1 IU/mL
QuantiFERON TB2 Ag Value: 0.11 IU/mL

## 2021-02-05 LAB — QUANTIFERON-TB GOLD PLUS: QuantiFERON-TB Gold Plus: UNDETERMINED — AB

## 2021-02-06 DIAGNOSIS — N186 End stage renal disease: Secondary | ICD-10-CM | POA: Diagnosis not present

## 2021-02-06 DIAGNOSIS — D631 Anemia in chronic kidney disease: Secondary | ICD-10-CM | POA: Diagnosis not present

## 2021-02-06 DIAGNOSIS — N2581 Secondary hyperparathyroidism of renal origin: Secondary | ICD-10-CM | POA: Diagnosis not present

## 2021-02-07 DIAGNOSIS — N2581 Secondary hyperparathyroidism of renal origin: Secondary | ICD-10-CM | POA: Diagnosis not present

## 2021-02-07 DIAGNOSIS — N186 End stage renal disease: Secondary | ICD-10-CM | POA: Diagnosis not present

## 2021-02-07 DIAGNOSIS — D631 Anemia in chronic kidney disease: Secondary | ICD-10-CM | POA: Diagnosis not present

## 2021-02-08 DIAGNOSIS — I120 Hypertensive chronic kidney disease with stage 5 chronic kidney disease or end stage renal disease: Secondary | ICD-10-CM | POA: Diagnosis not present

## 2021-02-08 DIAGNOSIS — G062 Extradural and subdural abscess, unspecified: Secondary | ICD-10-CM | POA: Diagnosis not present

## 2021-02-08 DIAGNOSIS — M4647 Discitis, unspecified, lumbosacral region: Secondary | ICD-10-CM | POA: Diagnosis not present

## 2021-02-08 DIAGNOSIS — A499 Bacterial infection, unspecified: Secondary | ICD-10-CM | POA: Diagnosis not present

## 2021-02-08 DIAGNOSIS — Z4789 Encounter for other orthopedic aftercare: Secondary | ICD-10-CM | POA: Diagnosis not present

## 2021-02-08 DIAGNOSIS — N186 End stage renal disease: Secondary | ICD-10-CM | POA: Diagnosis not present

## 2021-02-08 DIAGNOSIS — N2581 Secondary hyperparathyroidism of renal origin: Secondary | ICD-10-CM | POA: Diagnosis not present

## 2021-02-08 DIAGNOSIS — M48061 Spinal stenosis, lumbar region without neurogenic claudication: Secondary | ICD-10-CM | POA: Diagnosis not present

## 2021-02-08 DIAGNOSIS — M4627 Osteomyelitis of vertebra, lumbosacral region: Secondary | ICD-10-CM | POA: Diagnosis not present

## 2021-02-08 DIAGNOSIS — D631 Anemia in chronic kidney disease: Secondary | ICD-10-CM | POA: Diagnosis not present

## 2021-02-09 DIAGNOSIS — M48061 Spinal stenosis, lumbar region without neurogenic claudication: Secondary | ICD-10-CM | POA: Diagnosis not present

## 2021-02-09 DIAGNOSIS — N2581 Secondary hyperparathyroidism of renal origin: Secondary | ICD-10-CM | POA: Diagnosis not present

## 2021-02-09 DIAGNOSIS — I120 Hypertensive chronic kidney disease with stage 5 chronic kidney disease or end stage renal disease: Secondary | ICD-10-CM | POA: Diagnosis not present

## 2021-02-09 DIAGNOSIS — M4647 Discitis, unspecified, lumbosacral region: Secondary | ICD-10-CM | POA: Diagnosis not present

## 2021-02-09 DIAGNOSIS — Z4789 Encounter for other orthopedic aftercare: Secondary | ICD-10-CM | POA: Diagnosis not present

## 2021-02-09 DIAGNOSIS — N186 End stage renal disease: Secondary | ICD-10-CM | POA: Diagnosis not present

## 2021-02-09 DIAGNOSIS — D631 Anemia in chronic kidney disease: Secondary | ICD-10-CM | POA: Diagnosis not present

## 2021-02-09 DIAGNOSIS — G062 Extradural and subdural abscess, unspecified: Secondary | ICD-10-CM | POA: Diagnosis not present

## 2021-02-09 DIAGNOSIS — M4627 Osteomyelitis of vertebra, lumbosacral region: Secondary | ICD-10-CM | POA: Diagnosis not present

## 2021-02-10 DIAGNOSIS — Z4789 Encounter for other orthopedic aftercare: Secondary | ICD-10-CM | POA: Diagnosis not present

## 2021-02-10 DIAGNOSIS — G062 Extradural and subdural abscess, unspecified: Secondary | ICD-10-CM | POA: Diagnosis not present

## 2021-02-10 DIAGNOSIS — D631 Anemia in chronic kidney disease: Secondary | ICD-10-CM | POA: Diagnosis not present

## 2021-02-10 DIAGNOSIS — I120 Hypertensive chronic kidney disease with stage 5 chronic kidney disease or end stage renal disease: Secondary | ICD-10-CM | POA: Diagnosis not present

## 2021-02-10 DIAGNOSIS — M4627 Osteomyelitis of vertebra, lumbosacral region: Secondary | ICD-10-CM | POA: Diagnosis not present

## 2021-02-10 DIAGNOSIS — M48061 Spinal stenosis, lumbar region without neurogenic claudication: Secondary | ICD-10-CM | POA: Diagnosis not present

## 2021-02-10 DIAGNOSIS — N2581 Secondary hyperparathyroidism of renal origin: Secondary | ICD-10-CM | POA: Diagnosis not present

## 2021-02-10 DIAGNOSIS — M4647 Discitis, unspecified, lumbosacral region: Secondary | ICD-10-CM | POA: Diagnosis not present

## 2021-02-10 DIAGNOSIS — N186 End stage renal disease: Secondary | ICD-10-CM | POA: Diagnosis not present

## 2021-02-11 DIAGNOSIS — N186 End stage renal disease: Secondary | ICD-10-CM | POA: Diagnosis not present

## 2021-02-11 DIAGNOSIS — D631 Anemia in chronic kidney disease: Secondary | ICD-10-CM | POA: Diagnosis not present

## 2021-02-11 DIAGNOSIS — N2581 Secondary hyperparathyroidism of renal origin: Secondary | ICD-10-CM | POA: Diagnosis not present

## 2021-02-12 DIAGNOSIS — N2581 Secondary hyperparathyroidism of renal origin: Secondary | ICD-10-CM | POA: Diagnosis not present

## 2021-02-12 DIAGNOSIS — D631 Anemia in chronic kidney disease: Secondary | ICD-10-CM | POA: Diagnosis not present

## 2021-02-12 DIAGNOSIS — N186 End stage renal disease: Secondary | ICD-10-CM | POA: Diagnosis not present

## 2021-02-13 DIAGNOSIS — N2581 Secondary hyperparathyroidism of renal origin: Secondary | ICD-10-CM | POA: Diagnosis not present

## 2021-02-13 DIAGNOSIS — D631 Anemia in chronic kidney disease: Secondary | ICD-10-CM | POA: Diagnosis not present

## 2021-02-13 DIAGNOSIS — N186 End stage renal disease: Secondary | ICD-10-CM | POA: Diagnosis not present

## 2021-02-14 ENCOUNTER — Ambulatory Visit: Payer: Medicare Other | Admitting: Diagnostic Neuroimaging

## 2021-02-14 DIAGNOSIS — M4647 Discitis, unspecified, lumbosacral region: Secondary | ICD-10-CM | POA: Diagnosis not present

## 2021-02-14 DIAGNOSIS — N186 End stage renal disease: Secondary | ICD-10-CM | POA: Diagnosis not present

## 2021-02-14 DIAGNOSIS — N2581 Secondary hyperparathyroidism of renal origin: Secondary | ICD-10-CM | POA: Diagnosis not present

## 2021-02-14 DIAGNOSIS — M4627 Osteomyelitis of vertebra, lumbosacral region: Secondary | ICD-10-CM | POA: Diagnosis not present

## 2021-02-14 DIAGNOSIS — F29 Unspecified psychosis not due to a substance or known physiological condition: Secondary | ICD-10-CM | POA: Diagnosis not present

## 2021-02-14 DIAGNOSIS — F250 Schizoaffective disorder, bipolar type: Secondary | ICD-10-CM | POA: Diagnosis not present

## 2021-02-14 DIAGNOSIS — Z4789 Encounter for other orthopedic aftercare: Secondary | ICD-10-CM | POA: Diagnosis not present

## 2021-02-14 DIAGNOSIS — G062 Extradural and subdural abscess, unspecified: Secondary | ICD-10-CM | POA: Diagnosis not present

## 2021-02-14 DIAGNOSIS — F319 Bipolar disorder, unspecified: Secondary | ICD-10-CM | POA: Diagnosis not present

## 2021-02-14 DIAGNOSIS — F71 Moderate intellectual disabilities: Secondary | ICD-10-CM | POA: Diagnosis not present

## 2021-02-14 DIAGNOSIS — I120 Hypertensive chronic kidney disease with stage 5 chronic kidney disease or end stage renal disease: Secondary | ICD-10-CM | POA: Diagnosis not present

## 2021-02-14 DIAGNOSIS — D631 Anemia in chronic kidney disease: Secondary | ICD-10-CM | POA: Diagnosis not present

## 2021-02-14 DIAGNOSIS — M48061 Spinal stenosis, lumbar region without neurogenic claudication: Secondary | ICD-10-CM | POA: Diagnosis not present

## 2021-02-15 DIAGNOSIS — M4647 Discitis, unspecified, lumbosacral region: Secondary | ICD-10-CM | POA: Diagnosis not present

## 2021-02-15 DIAGNOSIS — N186 End stage renal disease: Secondary | ICD-10-CM | POA: Diagnosis not present

## 2021-02-15 DIAGNOSIS — N2581 Secondary hyperparathyroidism of renal origin: Secondary | ICD-10-CM | POA: Diagnosis not present

## 2021-02-15 DIAGNOSIS — I120 Hypertensive chronic kidney disease with stage 5 chronic kidney disease or end stage renal disease: Secondary | ICD-10-CM | POA: Diagnosis not present

## 2021-02-15 DIAGNOSIS — G062 Extradural and subdural abscess, unspecified: Secondary | ICD-10-CM | POA: Diagnosis not present

## 2021-02-15 DIAGNOSIS — D631 Anemia in chronic kidney disease: Secondary | ICD-10-CM | POA: Diagnosis not present

## 2021-02-15 DIAGNOSIS — Z4789 Encounter for other orthopedic aftercare: Secondary | ICD-10-CM | POA: Diagnosis not present

## 2021-02-15 DIAGNOSIS — M4627 Osteomyelitis of vertebra, lumbosacral region: Secondary | ICD-10-CM | POA: Diagnosis not present

## 2021-02-15 DIAGNOSIS — M48061 Spinal stenosis, lumbar region without neurogenic claudication: Secondary | ICD-10-CM | POA: Diagnosis not present

## 2021-02-16 ENCOUNTER — Telehealth: Payer: Medicare Other | Admitting: Internal Medicine

## 2021-02-16 DIAGNOSIS — N2581 Secondary hyperparathyroidism of renal origin: Secondary | ICD-10-CM | POA: Diagnosis not present

## 2021-02-16 DIAGNOSIS — D631 Anemia in chronic kidney disease: Secondary | ICD-10-CM | POA: Diagnosis not present

## 2021-02-16 DIAGNOSIS — N186 End stage renal disease: Secondary | ICD-10-CM | POA: Diagnosis not present

## 2021-02-17 ENCOUNTER — Telehealth (INDEPENDENT_AMBULATORY_CARE_PROVIDER_SITE_OTHER): Payer: Medicare Other | Admitting: Internal Medicine

## 2021-02-17 ENCOUNTER — Other Ambulatory Visit: Payer: Self-pay

## 2021-02-17 ENCOUNTER — Encounter: Payer: Self-pay | Admitting: Internal Medicine

## 2021-02-17 DIAGNOSIS — D631 Anemia in chronic kidney disease: Secondary | ICD-10-CM | POA: Diagnosis not present

## 2021-02-17 DIAGNOSIS — G062 Extradural and subdural abscess, unspecified: Secondary | ICD-10-CM | POA: Diagnosis not present

## 2021-02-17 DIAGNOSIS — M4647 Discitis, unspecified, lumbosacral region: Secondary | ICD-10-CM

## 2021-02-17 DIAGNOSIS — N186 End stage renal disease: Secondary | ICD-10-CM | POA: Diagnosis not present

## 2021-02-17 DIAGNOSIS — I120 Hypertensive chronic kidney disease with stage 5 chronic kidney disease or end stage renal disease: Secondary | ICD-10-CM | POA: Diagnosis not present

## 2021-02-17 DIAGNOSIS — M4627 Osteomyelitis of vertebra, lumbosacral region: Secondary | ICD-10-CM | POA: Diagnosis not present

## 2021-02-17 DIAGNOSIS — N2581 Secondary hyperparathyroidism of renal origin: Secondary | ICD-10-CM | POA: Diagnosis not present

## 2021-02-17 DIAGNOSIS — Z4789 Encounter for other orthopedic aftercare: Secondary | ICD-10-CM | POA: Diagnosis not present

## 2021-02-17 DIAGNOSIS — M48061 Spinal stenosis, lumbar region without neurogenic claudication: Secondary | ICD-10-CM | POA: Diagnosis not present

## 2021-02-17 NOTE — Assessment & Plan Note (Signed)
She is doing well with treatment for her discitis and tolerating the antibiotics well.  Plan for 4 weeks post surgery through 03/08/21.  Will follow up with her just before that to be sure she continues to do well.  She will follow up with Dr. Annette Stable as well to examine the incision.  No changes and will arrange IR removal of her line after the next visit if she is doing well

## 2021-02-17 NOTE — Progress Notes (Signed)
   Subjective:    Patient ID: Jane [name], female    DOB: 29-Sep-1966, 55 y.o.   MRN: [id number]  HPI I connected with  Jane [name] on 02/17/21 by phone and verified that I am speaking with the correct person using two identifiers.   I discussed the limitations of evaluation and management by telemedicine. The patient expressed understanding and agreed to proceed.  Location: Patient - home Physician - clinic  Duration of visit: 12 minutes  hsfu visit Patient has a history of ESRD on peritoneal dialysis at home and is non-verbal and visit by phone is with her sister, who is her primary caregiver.  She is on cefazolin for 4 weeks post surgery through 03/08/21.  She had a right EJ tunneled catheter placed by IR on 02/01/21.  She is having no issues with n/v/d.  No rash reported.  The sister reports the incision is without drainage, no surrounding erythema.  No concerns today.    Review of Systems  Constitutional: Negative for fever.  Gastrointestinal: Negative for diarrhea and nausea.       Objective:   Physical Exam Constitutional:      Comments: Unable to examine via phone visit with non-verbal patient           Assessment & Plan:

## 2021-02-18 DIAGNOSIS — D631 Anemia in chronic kidney disease: Secondary | ICD-10-CM | POA: Diagnosis not present

## 2021-02-18 DIAGNOSIS — N2581 Secondary hyperparathyroidism of renal origin: Secondary | ICD-10-CM | POA: Diagnosis not present

## 2021-02-18 DIAGNOSIS — N186 End stage renal disease: Secondary | ICD-10-CM | POA: Diagnosis not present

## 2021-02-19 DIAGNOSIS — N186 End stage renal disease: Secondary | ICD-10-CM | POA: Diagnosis not present

## 2021-02-19 DIAGNOSIS — N2581 Secondary hyperparathyroidism of renal origin: Secondary | ICD-10-CM | POA: Diagnosis not present

## 2021-02-19 DIAGNOSIS — Z992 Dependence on renal dialysis: Secondary | ICD-10-CM | POA: Diagnosis not present

## 2021-02-19 DIAGNOSIS — D631 Anemia in chronic kidney disease: Secondary | ICD-10-CM | POA: Diagnosis not present

## 2021-02-20 DIAGNOSIS — N2581 Secondary hyperparathyroidism of renal origin: Secondary | ICD-10-CM | POA: Diagnosis not present

## 2021-02-20 DIAGNOSIS — N186 End stage renal disease: Secondary | ICD-10-CM | POA: Diagnosis not present

## 2021-02-20 DIAGNOSIS — D631 Anemia in chronic kidney disease: Secondary | ICD-10-CM | POA: Diagnosis not present

## 2021-02-21 DIAGNOSIS — N2581 Secondary hyperparathyroidism of renal origin: Secondary | ICD-10-CM | POA: Diagnosis not present

## 2021-02-21 DIAGNOSIS — D631 Anemia in chronic kidney disease: Secondary | ICD-10-CM | POA: Diagnosis not present

## 2021-02-21 DIAGNOSIS — N186 End stage renal disease: Secondary | ICD-10-CM | POA: Diagnosis not present

## 2021-02-22 DIAGNOSIS — I120 Hypertensive chronic kidney disease with stage 5 chronic kidney disease or end stage renal disease: Secondary | ICD-10-CM | POA: Diagnosis not present

## 2021-02-22 DIAGNOSIS — N2581 Secondary hyperparathyroidism of renal origin: Secondary | ICD-10-CM | POA: Diagnosis not present

## 2021-02-22 DIAGNOSIS — Z4789 Encounter for other orthopedic aftercare: Secondary | ICD-10-CM | POA: Diagnosis not present

## 2021-02-22 DIAGNOSIS — D631 Anemia in chronic kidney disease: Secondary | ICD-10-CM | POA: Diagnosis not present

## 2021-02-22 DIAGNOSIS — N186 End stage renal disease: Secondary | ICD-10-CM | POA: Diagnosis not present

## 2021-02-22 DIAGNOSIS — M4627 Osteomyelitis of vertebra, lumbosacral region: Secondary | ICD-10-CM | POA: Diagnosis not present

## 2021-02-22 DIAGNOSIS — M48061 Spinal stenosis, lumbar region without neurogenic claudication: Secondary | ICD-10-CM | POA: Diagnosis not present

## 2021-02-22 DIAGNOSIS — M4647 Discitis, unspecified, lumbosacral region: Secondary | ICD-10-CM | POA: Diagnosis not present

## 2021-02-22 DIAGNOSIS — G062 Extradural and subdural abscess, unspecified: Secondary | ICD-10-CM | POA: Diagnosis not present

## 2021-02-23 DIAGNOSIS — D631 Anemia in chronic kidney disease: Secondary | ICD-10-CM | POA: Diagnosis not present

## 2021-02-23 DIAGNOSIS — N186 End stage renal disease: Secondary | ICD-10-CM | POA: Diagnosis not present

## 2021-02-23 DIAGNOSIS — N2581 Secondary hyperparathyroidism of renal origin: Secondary | ICD-10-CM | POA: Diagnosis not present

## 2021-02-24 DIAGNOSIS — N186 End stage renal disease: Secondary | ICD-10-CM | POA: Diagnosis not present

## 2021-02-24 DIAGNOSIS — D631 Anemia in chronic kidney disease: Secondary | ICD-10-CM | POA: Diagnosis not present

## 2021-02-24 DIAGNOSIS — N2581 Secondary hyperparathyroidism of renal origin: Secondary | ICD-10-CM | POA: Diagnosis not present

## 2021-02-25 DIAGNOSIS — N186 End stage renal disease: Secondary | ICD-10-CM | POA: Diagnosis not present

## 2021-02-25 DIAGNOSIS — D631 Anemia in chronic kidney disease: Secondary | ICD-10-CM | POA: Diagnosis not present

## 2021-02-25 DIAGNOSIS — N2581 Secondary hyperparathyroidism of renal origin: Secondary | ICD-10-CM | POA: Diagnosis not present

## 2021-02-26 DIAGNOSIS — N2581 Secondary hyperparathyroidism of renal origin: Secondary | ICD-10-CM | POA: Diagnosis not present

## 2021-02-26 DIAGNOSIS — D631 Anemia in chronic kidney disease: Secondary | ICD-10-CM | POA: Diagnosis not present

## 2021-02-26 DIAGNOSIS — N186 End stage renal disease: Secondary | ICD-10-CM | POA: Diagnosis not present

## 2021-02-27 DIAGNOSIS — N2581 Secondary hyperparathyroidism of renal origin: Secondary | ICD-10-CM | POA: Diagnosis not present

## 2021-02-27 DIAGNOSIS — D631 Anemia in chronic kidney disease: Secondary | ICD-10-CM | POA: Diagnosis not present

## 2021-02-27 DIAGNOSIS — N186 End stage renal disease: Secondary | ICD-10-CM | POA: Diagnosis not present

## 2021-02-28 DIAGNOSIS — D631 Anemia in chronic kidney disease: Secondary | ICD-10-CM | POA: Diagnosis not present

## 2021-02-28 DIAGNOSIS — N2581 Secondary hyperparathyroidism of renal origin: Secondary | ICD-10-CM | POA: Diagnosis not present

## 2021-02-28 DIAGNOSIS — N186 End stage renal disease: Secondary | ICD-10-CM | POA: Diagnosis not present

## 2021-03-01 DIAGNOSIS — N2581 Secondary hyperparathyroidism of renal origin: Secondary | ICD-10-CM | POA: Diagnosis not present

## 2021-03-01 DIAGNOSIS — Z4789 Encounter for other orthopedic aftercare: Secondary | ICD-10-CM | POA: Diagnosis not present

## 2021-03-01 DIAGNOSIS — I120 Hypertensive chronic kidney disease with stage 5 chronic kidney disease or end stage renal disease: Secondary | ICD-10-CM | POA: Diagnosis not present

## 2021-03-01 DIAGNOSIS — D631 Anemia in chronic kidney disease: Secondary | ICD-10-CM | POA: Diagnosis not present

## 2021-03-01 DIAGNOSIS — M4627 Osteomyelitis of vertebra, lumbosacral region: Secondary | ICD-10-CM | POA: Diagnosis not present

## 2021-03-01 DIAGNOSIS — M48061 Spinal stenosis, lumbar region without neurogenic claudication: Secondary | ICD-10-CM | POA: Diagnosis not present

## 2021-03-01 DIAGNOSIS — M4647 Discitis, unspecified, lumbosacral region: Secondary | ICD-10-CM | POA: Diagnosis not present

## 2021-03-01 DIAGNOSIS — G062 Extradural and subdural abscess, unspecified: Secondary | ICD-10-CM | POA: Diagnosis not present

## 2021-03-01 DIAGNOSIS — N186 End stage renal disease: Secondary | ICD-10-CM | POA: Diagnosis not present

## 2021-03-02 DIAGNOSIS — D631 Anemia in chronic kidney disease: Secondary | ICD-10-CM | POA: Diagnosis not present

## 2021-03-02 DIAGNOSIS — N2581 Secondary hyperparathyroidism of renal origin: Secondary | ICD-10-CM | POA: Diagnosis not present

## 2021-03-02 DIAGNOSIS — N186 End stage renal disease: Secondary | ICD-10-CM | POA: Diagnosis not present

## 2021-03-03 DIAGNOSIS — N186 End stage renal disease: Secondary | ICD-10-CM | POA: Diagnosis not present

## 2021-03-03 DIAGNOSIS — D631 Anemia in chronic kidney disease: Secondary | ICD-10-CM | POA: Diagnosis not present

## 2021-03-03 DIAGNOSIS — Z4789 Encounter for other orthopedic aftercare: Secondary | ICD-10-CM | POA: Diagnosis not present

## 2021-03-03 DIAGNOSIS — N2581 Secondary hyperparathyroidism of renal origin: Secondary | ICD-10-CM | POA: Diagnosis not present

## 2021-03-03 DIAGNOSIS — I120 Hypertensive chronic kidney disease with stage 5 chronic kidney disease or end stage renal disease: Secondary | ICD-10-CM | POA: Diagnosis not present

## 2021-03-03 DIAGNOSIS — M4627 Osteomyelitis of vertebra, lumbosacral region: Secondary | ICD-10-CM | POA: Diagnosis not present

## 2021-03-03 DIAGNOSIS — M48061 Spinal stenosis, lumbar region without neurogenic claudication: Secondary | ICD-10-CM | POA: Diagnosis not present

## 2021-03-03 DIAGNOSIS — M4647 Discitis, unspecified, lumbosacral region: Secondary | ICD-10-CM | POA: Diagnosis not present

## 2021-03-03 DIAGNOSIS — G062 Extradural and subdural abscess, unspecified: Secondary | ICD-10-CM | POA: Diagnosis not present

## 2021-03-04 DIAGNOSIS — N186 End stage renal disease: Secondary | ICD-10-CM | POA: Diagnosis not present

## 2021-03-04 DIAGNOSIS — N2581 Secondary hyperparathyroidism of renal origin: Secondary | ICD-10-CM | POA: Diagnosis not present

## 2021-03-04 DIAGNOSIS — D631 Anemia in chronic kidney disease: Secondary | ICD-10-CM | POA: Diagnosis not present

## 2021-03-05 DIAGNOSIS — N2581 Secondary hyperparathyroidism of renal origin: Secondary | ICD-10-CM | POA: Diagnosis not present

## 2021-03-05 DIAGNOSIS — D631 Anemia in chronic kidney disease: Secondary | ICD-10-CM | POA: Diagnosis not present

## 2021-03-05 DIAGNOSIS — N186 End stage renal disease: Secondary | ICD-10-CM | POA: Diagnosis not present

## 2021-03-06 DIAGNOSIS — N2581 Secondary hyperparathyroidism of renal origin: Secondary | ICD-10-CM | POA: Diagnosis not present

## 2021-03-06 DIAGNOSIS — D631 Anemia in chronic kidney disease: Secondary | ICD-10-CM | POA: Diagnosis not present

## 2021-03-06 DIAGNOSIS — N186 End stage renal disease: Secondary | ICD-10-CM | POA: Diagnosis not present

## 2021-03-07 DIAGNOSIS — G062 Extradural and subdural abscess, unspecified: Secondary | ICD-10-CM | POA: Diagnosis not present

## 2021-03-07 DIAGNOSIS — K8020 Calculus of gallbladder without cholecystitis without obstruction: Secondary | ICD-10-CM | POA: Diagnosis not present

## 2021-03-07 DIAGNOSIS — M5126 Other intervertebral disc displacement, lumbar region: Secondary | ICD-10-CM | POA: Diagnosis not present

## 2021-03-07 DIAGNOSIS — R188 Other ascites: Secondary | ICD-10-CM | POA: Diagnosis not present

## 2021-03-07 DIAGNOSIS — E039 Hypothyroidism, unspecified: Secondary | ICD-10-CM | POA: Diagnosis not present

## 2021-03-07 DIAGNOSIS — Z452 Encounter for adjustment and management of vascular access device: Secondary | ICD-10-CM | POA: Diagnosis not present

## 2021-03-07 DIAGNOSIS — N2581 Secondary hyperparathyroidism of renal origin: Secondary | ICD-10-CM | POA: Diagnosis not present

## 2021-03-07 DIAGNOSIS — F319 Bipolar disorder, unspecified: Secondary | ICD-10-CM | POA: Diagnosis not present

## 2021-03-07 DIAGNOSIS — N186 End stage renal disease: Secondary | ICD-10-CM | POA: Diagnosis not present

## 2021-03-07 DIAGNOSIS — N281 Cyst of kidney, acquired: Secondary | ICD-10-CM | POA: Diagnosis not present

## 2021-03-07 DIAGNOSIS — Z992 Dependence on renal dialysis: Secondary | ICD-10-CM | POA: Diagnosis not present

## 2021-03-07 DIAGNOSIS — E785 Hyperlipidemia, unspecified: Secondary | ICD-10-CM | POA: Diagnosis not present

## 2021-03-07 DIAGNOSIS — F209 Schizophrenia, unspecified: Secondary | ICD-10-CM | POA: Diagnosis not present

## 2021-03-07 DIAGNOSIS — Z792 Long term (current) use of antibiotics: Secondary | ICD-10-CM | POA: Diagnosis not present

## 2021-03-07 DIAGNOSIS — F71 Moderate intellectual disabilities: Secondary | ICD-10-CM | POA: Diagnosis not present

## 2021-03-07 DIAGNOSIS — M4627 Osteomyelitis of vertebra, lumbosacral region: Secondary | ICD-10-CM | POA: Diagnosis not present

## 2021-03-07 DIAGNOSIS — Z9181 History of falling: Secondary | ICD-10-CM | POA: Diagnosis not present

## 2021-03-07 DIAGNOSIS — D631 Anemia in chronic kidney disease: Secondary | ICD-10-CM | POA: Diagnosis not present

## 2021-03-07 DIAGNOSIS — G40909 Epilepsy, unspecified, not intractable, without status epilepticus: Secondary | ICD-10-CM | POA: Diagnosis not present

## 2021-03-07 DIAGNOSIS — I120 Hypertensive chronic kidney disease with stage 5 chronic kidney disease or end stage renal disease: Secondary | ICD-10-CM | POA: Diagnosis not present

## 2021-03-07 DIAGNOSIS — M48061 Spinal stenosis, lumbar region without neurogenic claudication: Secondary | ICD-10-CM | POA: Diagnosis not present

## 2021-03-07 DIAGNOSIS — Z4789 Encounter for other orthopedic aftercare: Secondary | ICD-10-CM | POA: Diagnosis not present

## 2021-03-07 DIAGNOSIS — M4647 Discitis, unspecified, lumbosacral region: Secondary | ICD-10-CM | POA: Diagnosis not present

## 2021-03-07 DIAGNOSIS — K219 Gastro-esophageal reflux disease without esophagitis: Secondary | ICD-10-CM | POA: Diagnosis not present

## 2021-03-07 DIAGNOSIS — I700 Atherosclerosis of aorta: Secondary | ICD-10-CM | POA: Diagnosis not present

## 2021-03-08 DIAGNOSIS — I120 Hypertensive chronic kidney disease with stage 5 chronic kidney disease or end stage renal disease: Secondary | ICD-10-CM | POA: Diagnosis not present

## 2021-03-08 DIAGNOSIS — N2581 Secondary hyperparathyroidism of renal origin: Secondary | ICD-10-CM | POA: Diagnosis not present

## 2021-03-08 DIAGNOSIS — G062 Extradural and subdural abscess, unspecified: Secondary | ICD-10-CM | POA: Diagnosis not present

## 2021-03-08 DIAGNOSIS — M4627 Osteomyelitis of vertebra, lumbosacral region: Secondary | ICD-10-CM | POA: Diagnosis not present

## 2021-03-08 DIAGNOSIS — N186 End stage renal disease: Secondary | ICD-10-CM | POA: Diagnosis not present

## 2021-03-08 DIAGNOSIS — M4647 Discitis, unspecified, lumbosacral region: Secondary | ICD-10-CM | POA: Diagnosis not present

## 2021-03-08 DIAGNOSIS — Z4789 Encounter for other orthopedic aftercare: Secondary | ICD-10-CM | POA: Diagnosis not present

## 2021-03-08 DIAGNOSIS — D631 Anemia in chronic kidney disease: Secondary | ICD-10-CM | POA: Diagnosis not present

## 2021-03-08 DIAGNOSIS — M48061 Spinal stenosis, lumbar region without neurogenic claudication: Secondary | ICD-10-CM | POA: Diagnosis not present

## 2021-03-08 DIAGNOSIS — Z792 Long term (current) use of antibiotics: Secondary | ICD-10-CM | POA: Diagnosis not present

## 2021-03-09 ENCOUNTER — Ambulatory Visit: Payer: Medicare Other

## 2021-03-09 ENCOUNTER — Telehealth: Payer: Self-pay

## 2021-03-09 DIAGNOSIS — D631 Anemia in chronic kidney disease: Secondary | ICD-10-CM | POA: Diagnosis not present

## 2021-03-09 DIAGNOSIS — N2581 Secondary hyperparathyroidism of renal origin: Secondary | ICD-10-CM | POA: Diagnosis not present

## 2021-03-09 DIAGNOSIS — N186 End stage renal disease: Secondary | ICD-10-CM | POA: Diagnosis not present

## 2021-03-09 NOTE — Telephone Encounter (Signed)
Received call from [facility] [facility] team stating they received labs from [facility]. Report shows:   Hgb: 3.3 (last week 9.2) Hct: 10.4 (last week 28.3)  They're concerned about possible dilution of sample. Patient was contacted and sister did not report anything of concern; no increased lethargy or difference in her behavior (per Jane [name]).   Forwarding to provider.

## 2021-03-09 NOTE — Telephone Encounter (Signed)
Discussed labs with Marya Amsler, NP. Contacted sister and recommended going to the ED for redraw. She states patient is stable (no shortness of breath, no paleness noted) and she'd try to get her to the ED but wasn't sure if she could. [facility] given orders for STAT CBC redraw. Alvis Lemmings unable to send RN to get labs tonight; will go in the AM.   Will notify provider.  Natori Gudino Lorita Officer, RN

## 2021-03-10 DIAGNOSIS — M4647 Discitis, unspecified, lumbosacral region: Secondary | ICD-10-CM | POA: Diagnosis not present

## 2021-03-10 DIAGNOSIS — M48061 Spinal stenosis, lumbar region without neurogenic claudication: Secondary | ICD-10-CM | POA: Diagnosis not present

## 2021-03-10 DIAGNOSIS — N2581 Secondary hyperparathyroidism of renal origin: Secondary | ICD-10-CM | POA: Diagnosis not present

## 2021-03-10 DIAGNOSIS — Z4789 Encounter for other orthopedic aftercare: Secondary | ICD-10-CM | POA: Diagnosis not present

## 2021-03-10 DIAGNOSIS — M4627 Osteomyelitis of vertebra, lumbosacral region: Secondary | ICD-10-CM | POA: Diagnosis not present

## 2021-03-10 DIAGNOSIS — N186 End stage renal disease: Secondary | ICD-10-CM | POA: Diagnosis not present

## 2021-03-10 DIAGNOSIS — G062 Extradural and subdural abscess, unspecified: Secondary | ICD-10-CM | POA: Diagnosis not present

## 2021-03-10 DIAGNOSIS — I120 Hypertensive chronic kidney disease with stage 5 chronic kidney disease or end stage renal disease: Secondary | ICD-10-CM | POA: Diagnosis not present

## 2021-03-10 DIAGNOSIS — D631 Anemia in chronic kidney disease: Secondary | ICD-10-CM | POA: Diagnosis not present

## 2021-03-11 ENCOUNTER — Telehealth: Payer: Self-pay

## 2021-03-11 ENCOUNTER — Other Ambulatory Visit: Payer: Self-pay

## 2021-03-11 ENCOUNTER — Telehealth (INDEPENDENT_AMBULATORY_CARE_PROVIDER_SITE_OTHER): Payer: Medicare Other | Admitting: Internal Medicine

## 2021-03-11 ENCOUNTER — Encounter: Payer: Self-pay | Admitting: Internal Medicine

## 2021-03-11 DIAGNOSIS — N2581 Secondary hyperparathyroidism of renal origin: Secondary | ICD-10-CM | POA: Diagnosis not present

## 2021-03-11 DIAGNOSIS — Z3042 Encounter for surveillance of injectable contraceptive: Secondary | ICD-10-CM | POA: Diagnosis not present

## 2021-03-11 DIAGNOSIS — N186 End stage renal disease: Secondary | ICD-10-CM | POA: Diagnosis not present

## 2021-03-11 DIAGNOSIS — D631 Anemia in chronic kidney disease: Secondary | ICD-10-CM | POA: Diagnosis not present

## 2021-03-11 DIAGNOSIS — D539 Nutritional anemia, unspecified: Secondary | ICD-10-CM | POA: Diagnosis not present

## 2021-03-11 DIAGNOSIS — M4647 Discitis, unspecified, lumbosacral region: Secondary | ICD-10-CM | POA: Diagnosis not present

## 2021-03-11 DIAGNOSIS — R569 Unspecified convulsions: Secondary | ICD-10-CM | POA: Diagnosis not present

## 2021-03-11 DIAGNOSIS — E782 Mixed hyperlipidemia: Secondary | ICD-10-CM | POA: Diagnosis not present

## 2021-03-11 DIAGNOSIS — M5430 Sciatica, unspecified side: Secondary | ICD-10-CM | POA: Diagnosis not present

## 2021-03-11 DIAGNOSIS — E039 Hypothyroidism, unspecified: Secondary | ICD-10-CM | POA: Diagnosis not present

## 2021-03-11 MED ORDER — CEPHALEXIN 500 MG PO CAPS: 500.0000 mg | ORAL_CAPSULE | Freq: Four times a day (QID) | ORAL | 0 refills | Status: DC

## 2021-03-11 NOTE — Assessment & Plan Note (Addendum)
She is doing well and inflammatory markers now have declined and incision closed with no reported concerns.  At this time, I will have her transition to oral cephalexin for 1 month for 8 weeks of treatment and can stop at that time and observe off of antibiotics.   Will get her scheduled for IR removal of her tunneled catheter.

## 2021-03-11 NOTE — Progress Notes (Signed)
   Subjective:    Patient ID: Jane [name], female    DOB: 04-27-1966, 55 y.o.   MRN: [id number]  I connected with  Jane [name] on 03/11/21 by a video enabled telemedicine application and verified that I am speaking with the correct person using two identifiers.   I discussed the limitations of evaluation and management by telemedicine. The patient expressed understanding and agreed to proceed.  Location: Patient - home Physician - clinic  Duration of visit: 20 minutes  HPI This is a follow up for discitis and epidural abscess She underwent laminectomy for discitis, epidural abscess by Dr. Annette Stable on 01/25/21 and culture grew Staph epidermidis, methicillin sensitive in multiple cultures from the OR.  Blood cultures only grew one bottle with micrococcus luteus, most c/w a contaminate.  I placed her on IV cefazolin for 4 weeks post op and she is doing well.  Incision is closed.  Inflammatory markers have been up but most recent check ESR and CRP were near normal.  History is mainly from her mother who is her primary caregiver.  No issues with rash or diarrhea.     Review of Systems  Constitutional: Negative for chills and fever.  Gastrointestinal: Negative for diarrhea.  Skin: Negative for rash.       Objective:   Physical Exam Pulmonary:     Effort: No respiratory distress.  Neurological:     Mental Status: She is alert.           Assessment & Plan:

## 2021-03-11 NOTE — Telephone Encounter (Signed)
Sent message to [place] at IR to schedule appt. Will need to update patient once picc removal has been scheduled.  Updated patient's sister to pick up oral Cephalexin. Hilda Blades understands RX was sent into [facility]. Will pick up medication today. Leatrice Jewels, RMA

## 2021-03-11 NOTE — Telephone Encounter (Signed)
Left VM with IR to schedule CVC removal at next available opening.   Taft Worthing Lorita Officer, RN

## 2021-03-11 NOTE — Telephone Encounter (Signed)
-----   Message from Leatrice Jewels, [place] sent at 03/11/2021  9:03 AM EDT ----- Regarding: EJ picc remvoal Hey, I need to schedule an appt for picc removal. Next available appt is fine Thank you.  Leatrice Jewels, RMA

## 2021-03-11 NOTE — Telephone Encounter (Signed)
Jeani Hawking called with [facility] to confirm that patient is done with IV antibiotics. I have advised Jeani Hawking patient is done with IV antibiotics and our office is in process of scheduling her to have the Tunneled CVC removed.  Jeani Hawking verbalized understanding. Renalda Locklin T Brooks Sailors

## 2021-03-12 DIAGNOSIS — N2581 Secondary hyperparathyroidism of renal origin: Secondary | ICD-10-CM | POA: Diagnosis not present

## 2021-03-12 DIAGNOSIS — N186 End stage renal disease: Secondary | ICD-10-CM | POA: Diagnosis not present

## 2021-03-12 DIAGNOSIS — D631 Anemia in chronic kidney disease: Secondary | ICD-10-CM | POA: Diagnosis not present

## 2021-03-13 DIAGNOSIS — N186 End stage renal disease: Secondary | ICD-10-CM | POA: Diagnosis not present

## 2021-03-13 DIAGNOSIS — D631 Anemia in chronic kidney disease: Secondary | ICD-10-CM | POA: Diagnosis not present

## 2021-03-13 DIAGNOSIS — N2581 Secondary hyperparathyroidism of renal origin: Secondary | ICD-10-CM | POA: Diagnosis not present

## 2021-03-14 DIAGNOSIS — M4647 Discitis, unspecified, lumbosacral region: Secondary | ICD-10-CM | POA: Diagnosis not present

## 2021-03-14 DIAGNOSIS — I120 Hypertensive chronic kidney disease with stage 5 chronic kidney disease or end stage renal disease: Secondary | ICD-10-CM | POA: Diagnosis not present

## 2021-03-14 DIAGNOSIS — D631 Anemia in chronic kidney disease: Secondary | ICD-10-CM | POA: Diagnosis not present

## 2021-03-14 DIAGNOSIS — M48061 Spinal stenosis, lumbar region without neurogenic claudication: Secondary | ICD-10-CM | POA: Diagnosis not present

## 2021-03-14 DIAGNOSIS — N2581 Secondary hyperparathyroidism of renal origin: Secondary | ICD-10-CM | POA: Diagnosis not present

## 2021-03-14 DIAGNOSIS — G062 Extradural and subdural abscess, unspecified: Secondary | ICD-10-CM | POA: Diagnosis not present

## 2021-03-14 DIAGNOSIS — M4627 Osteomyelitis of vertebra, lumbosacral region: Secondary | ICD-10-CM | POA: Diagnosis not present

## 2021-03-14 DIAGNOSIS — N186 End stage renal disease: Secondary | ICD-10-CM | POA: Diagnosis not present

## 2021-03-14 DIAGNOSIS — Z4789 Encounter for other orthopedic aftercare: Secondary | ICD-10-CM | POA: Diagnosis not present

## 2021-03-14 NOTE — Telephone Encounter (Signed)
Spoke with patient's sister and notified her of IR appointment on 03/15/21 at 11:45 am at [facility]. She verbalized understanding and has no further questions.   Beryle Flock, RN

## 2021-03-15 ENCOUNTER — Ambulatory Visit (HOSPITAL_COMMUNITY)
Admission: RE | Admit: 2021-03-15 | Discharge: 2021-03-15 | Disposition: A | Discharge status: Home / Self Care | Payer: Medicare Other | Source: Ambulatory Visit | Attending: Internal Medicine | Admitting: Internal Medicine

## 2021-03-15 ENCOUNTER — Other Ambulatory Visit: Payer: Self-pay

## 2021-03-15 DIAGNOSIS — Z4901 Encounter for fitting and adjustment of extracorporeal dialysis catheter: Secondary | ICD-10-CM | POA: Insufficient documentation

## 2021-03-15 DIAGNOSIS — M4647 Discitis, unspecified, lumbosacral region: Secondary | ICD-10-CM | POA: Diagnosis not present

## 2021-03-15 DIAGNOSIS — N186 End stage renal disease: Secondary | ICD-10-CM | POA: Diagnosis not present

## 2021-03-15 DIAGNOSIS — Z452 Encounter for adjustment and management of vascular access device: Secondary | ICD-10-CM | POA: Diagnosis not present

## 2021-03-15 DIAGNOSIS — D631 Anemia in chronic kidney disease: Secondary | ICD-10-CM | POA: Diagnosis not present

## 2021-03-15 DIAGNOSIS — N2581 Secondary hyperparathyroidism of renal origin: Secondary | ICD-10-CM | POA: Diagnosis not present

## 2021-03-15 HISTORY — PX: IR REMOVAL TUN CV CATH W/O FL: IMG2289

## 2021-03-15 NOTE — Procedures (Signed)
Per order of Dr. Linus Salmons, pt's right EJ tunneled central venous catheter was removed in its entirety without immediate complications. Gauze dressing applied over site. EBL none. Medications used- none.

## 2021-03-16 DIAGNOSIS — D631 Anemia in chronic kidney disease: Secondary | ICD-10-CM | POA: Diagnosis not present

## 2021-03-16 DIAGNOSIS — N2581 Secondary hyperparathyroidism of renal origin: Secondary | ICD-10-CM | POA: Diagnosis not present

## 2021-03-16 DIAGNOSIS — N186 End stage renal disease: Secondary | ICD-10-CM | POA: Diagnosis not present

## 2021-03-17 DIAGNOSIS — N2581 Secondary hyperparathyroidism of renal origin: Secondary | ICD-10-CM | POA: Diagnosis not present

## 2021-03-17 DIAGNOSIS — N186 End stage renal disease: Secondary | ICD-10-CM | POA: Diagnosis not present

## 2021-03-17 DIAGNOSIS — D631 Anemia in chronic kidney disease: Secondary | ICD-10-CM | POA: Diagnosis not present

## 2021-03-18 DIAGNOSIS — N2581 Secondary hyperparathyroidism of renal origin: Secondary | ICD-10-CM | POA: Diagnosis not present

## 2021-03-18 DIAGNOSIS — N186 End stage renal disease: Secondary | ICD-10-CM | POA: Diagnosis not present

## 2021-03-18 DIAGNOSIS — D631 Anemia in chronic kidney disease: Secondary | ICD-10-CM | POA: Diagnosis not present

## 2021-03-19 DIAGNOSIS — N2581 Secondary hyperparathyroidism of renal origin: Secondary | ICD-10-CM | POA: Diagnosis not present

## 2021-03-19 DIAGNOSIS — D631 Anemia in chronic kidney disease: Secondary | ICD-10-CM | POA: Diagnosis not present

## 2021-03-19 DIAGNOSIS — N186 End stage renal disease: Secondary | ICD-10-CM | POA: Diagnosis not present

## 2021-03-20 DIAGNOSIS — N2581 Secondary hyperparathyroidism of renal origin: Secondary | ICD-10-CM | POA: Diagnosis not present

## 2021-03-20 DIAGNOSIS — D631 Anemia in chronic kidney disease: Secondary | ICD-10-CM | POA: Diagnosis not present

## 2021-03-20 DIAGNOSIS — N186 End stage renal disease: Secondary | ICD-10-CM | POA: Diagnosis not present

## 2021-03-21 DIAGNOSIS — D631 Anemia in chronic kidney disease: Secondary | ICD-10-CM | POA: Diagnosis not present

## 2021-03-21 DIAGNOSIS — N186 End stage renal disease: Secondary | ICD-10-CM | POA: Diagnosis not present

## 2021-03-21 DIAGNOSIS — N2581 Secondary hyperparathyroidism of renal origin: Secondary | ICD-10-CM | POA: Diagnosis not present

## 2021-03-22 DIAGNOSIS — D631 Anemia in chronic kidney disease: Secondary | ICD-10-CM | POA: Diagnosis not present

## 2021-03-22 DIAGNOSIS — N186 End stage renal disease: Secondary | ICD-10-CM | POA: Diagnosis not present

## 2021-03-22 DIAGNOSIS — N2581 Secondary hyperparathyroidism of renal origin: Secondary | ICD-10-CM | POA: Diagnosis not present

## 2021-03-22 DIAGNOSIS — Z992 Dependence on renal dialysis: Secondary | ICD-10-CM | POA: Diagnosis not present

## 2021-03-23 DIAGNOSIS — N186 End stage renal disease: Secondary | ICD-10-CM | POA: Diagnosis not present

## 2021-03-23 DIAGNOSIS — N2581 Secondary hyperparathyroidism of renal origin: Secondary | ICD-10-CM | POA: Diagnosis not present

## 2021-03-23 DIAGNOSIS — D631 Anemia in chronic kidney disease: Secondary | ICD-10-CM | POA: Diagnosis not present

## 2021-03-24 DIAGNOSIS — N2581 Secondary hyperparathyroidism of renal origin: Secondary | ICD-10-CM | POA: Diagnosis not present

## 2021-03-24 DIAGNOSIS — N186 End stage renal disease: Secondary | ICD-10-CM | POA: Diagnosis not present

## 2021-03-24 DIAGNOSIS — D631 Anemia in chronic kidney disease: Secondary | ICD-10-CM | POA: Diagnosis not present

## 2021-03-25 DIAGNOSIS — N186 End stage renal disease: Secondary | ICD-10-CM | POA: Diagnosis not present

## 2021-03-25 DIAGNOSIS — D631 Anemia in chronic kidney disease: Secondary | ICD-10-CM | POA: Diagnosis not present

## 2021-03-25 DIAGNOSIS — N2581 Secondary hyperparathyroidism of renal origin: Secondary | ICD-10-CM | POA: Diagnosis not present

## 2021-03-26 DIAGNOSIS — D631 Anemia in chronic kidney disease: Secondary | ICD-10-CM | POA: Diagnosis not present

## 2021-03-26 DIAGNOSIS — N186 End stage renal disease: Secondary | ICD-10-CM | POA: Diagnosis not present

## 2021-03-26 DIAGNOSIS — N2581 Secondary hyperparathyroidism of renal origin: Secondary | ICD-10-CM | POA: Diagnosis not present

## 2021-03-27 DIAGNOSIS — N186 End stage renal disease: Secondary | ICD-10-CM | POA: Diagnosis not present

## 2021-03-27 DIAGNOSIS — N2581 Secondary hyperparathyroidism of renal origin: Secondary | ICD-10-CM | POA: Diagnosis not present

## 2021-03-27 DIAGNOSIS — D631 Anemia in chronic kidney disease: Secondary | ICD-10-CM | POA: Diagnosis not present

## 2021-03-28 DIAGNOSIS — N2581 Secondary hyperparathyroidism of renal origin: Secondary | ICD-10-CM | POA: Diagnosis not present

## 2021-03-28 DIAGNOSIS — D631 Anemia in chronic kidney disease: Secondary | ICD-10-CM | POA: Diagnosis not present

## 2021-03-28 DIAGNOSIS — N186 End stage renal disease: Secondary | ICD-10-CM | POA: Diagnosis not present

## 2021-03-29 DIAGNOSIS — N2581 Secondary hyperparathyroidism of renal origin: Secondary | ICD-10-CM | POA: Diagnosis not present

## 2021-03-29 DIAGNOSIS — N186 End stage renal disease: Secondary | ICD-10-CM | POA: Diagnosis not present

## 2021-03-29 DIAGNOSIS — D631 Anemia in chronic kidney disease: Secondary | ICD-10-CM | POA: Diagnosis not present

## 2021-03-30 DIAGNOSIS — N186 End stage renal disease: Secondary | ICD-10-CM | POA: Diagnosis not present

## 2021-03-30 DIAGNOSIS — D631 Anemia in chronic kidney disease: Secondary | ICD-10-CM | POA: Diagnosis not present

## 2021-03-30 DIAGNOSIS — N2581 Secondary hyperparathyroidism of renal origin: Secondary | ICD-10-CM | POA: Diagnosis not present

## 2021-03-31 DIAGNOSIS — N186 End stage renal disease: Secondary | ICD-10-CM | POA: Diagnosis not present

## 2021-03-31 DIAGNOSIS — D631 Anemia in chronic kidney disease: Secondary | ICD-10-CM | POA: Diagnosis not present

## 2021-03-31 DIAGNOSIS — N2581 Secondary hyperparathyroidism of renal origin: Secondary | ICD-10-CM | POA: Diagnosis not present

## 2021-04-01 DIAGNOSIS — N186 End stage renal disease: Secondary | ICD-10-CM | POA: Diagnosis not present

## 2021-04-01 DIAGNOSIS — D631 Anemia in chronic kidney disease: Secondary | ICD-10-CM | POA: Diagnosis not present

## 2021-04-01 DIAGNOSIS — N2581 Secondary hyperparathyroidism of renal origin: Secondary | ICD-10-CM | POA: Diagnosis not present

## 2021-04-02 DIAGNOSIS — N186 End stage renal disease: Secondary | ICD-10-CM | POA: Diagnosis not present

## 2021-04-02 DIAGNOSIS — D631 Anemia in chronic kidney disease: Secondary | ICD-10-CM | POA: Diagnosis not present

## 2021-04-02 DIAGNOSIS — N2581 Secondary hyperparathyroidism of renal origin: Secondary | ICD-10-CM | POA: Diagnosis not present

## 2021-04-03 DIAGNOSIS — D631 Anemia in chronic kidney disease: Secondary | ICD-10-CM | POA: Diagnosis not present

## 2021-04-03 DIAGNOSIS — N2581 Secondary hyperparathyroidism of renal origin: Secondary | ICD-10-CM | POA: Diagnosis not present

## 2021-04-03 DIAGNOSIS — N186 End stage renal disease: Secondary | ICD-10-CM | POA: Diagnosis not present

## 2021-04-04 DIAGNOSIS — N186 End stage renal disease: Secondary | ICD-10-CM | POA: Diagnosis not present

## 2021-04-04 DIAGNOSIS — N2581 Secondary hyperparathyroidism of renal origin: Secondary | ICD-10-CM | POA: Diagnosis not present

## 2021-04-04 DIAGNOSIS — D631 Anemia in chronic kidney disease: Secondary | ICD-10-CM | POA: Diagnosis not present

## 2021-04-05 DIAGNOSIS — N2581 Secondary hyperparathyroidism of renal origin: Secondary | ICD-10-CM | POA: Diagnosis not present

## 2021-04-05 DIAGNOSIS — D631 Anemia in chronic kidney disease: Secondary | ICD-10-CM | POA: Diagnosis not present

## 2021-04-05 DIAGNOSIS — N186 End stage renal disease: Secondary | ICD-10-CM | POA: Diagnosis not present

## 2021-04-06 DIAGNOSIS — D631 Anemia in chronic kidney disease: Secondary | ICD-10-CM | POA: Diagnosis not present

## 2021-04-06 DIAGNOSIS — N186 End stage renal disease: Secondary | ICD-10-CM | POA: Diagnosis not present

## 2021-04-06 DIAGNOSIS — N2581 Secondary hyperparathyroidism of renal origin: Secondary | ICD-10-CM | POA: Diagnosis not present

## 2021-04-07 DIAGNOSIS — D631 Anemia in chronic kidney disease: Secondary | ICD-10-CM | POA: Diagnosis not present

## 2021-04-07 DIAGNOSIS — N2581 Secondary hyperparathyroidism of renal origin: Secondary | ICD-10-CM | POA: Diagnosis not present

## 2021-04-07 DIAGNOSIS — N186 End stage renal disease: Secondary | ICD-10-CM | POA: Diagnosis not present

## 2021-04-08 DIAGNOSIS — N186 End stage renal disease: Secondary | ICD-10-CM | POA: Diagnosis not present

## 2021-04-08 DIAGNOSIS — D631 Anemia in chronic kidney disease: Secondary | ICD-10-CM | POA: Diagnosis not present

## 2021-04-08 DIAGNOSIS — N2581 Secondary hyperparathyroidism of renal origin: Secondary | ICD-10-CM | POA: Diagnosis not present

## 2021-04-09 DIAGNOSIS — D631 Anemia in chronic kidney disease: Secondary | ICD-10-CM | POA: Diagnosis not present

## 2021-04-09 DIAGNOSIS — N2581 Secondary hyperparathyroidism of renal origin: Secondary | ICD-10-CM | POA: Diagnosis not present

## 2021-04-09 DIAGNOSIS — N186 End stage renal disease: Secondary | ICD-10-CM | POA: Diagnosis not present

## 2021-04-10 DIAGNOSIS — N2581 Secondary hyperparathyroidism of renal origin: Secondary | ICD-10-CM | POA: Diagnosis not present

## 2021-04-10 DIAGNOSIS — D631 Anemia in chronic kidney disease: Secondary | ICD-10-CM | POA: Diagnosis not present

## 2021-04-10 DIAGNOSIS — N186 End stage renal disease: Secondary | ICD-10-CM | POA: Diagnosis not present

## 2021-04-11 DIAGNOSIS — N2581 Secondary hyperparathyroidism of renal origin: Secondary | ICD-10-CM | POA: Diagnosis not present

## 2021-04-11 DIAGNOSIS — N186 End stage renal disease: Secondary | ICD-10-CM | POA: Diagnosis not present

## 2021-04-11 DIAGNOSIS — D631 Anemia in chronic kidney disease: Secondary | ICD-10-CM | POA: Diagnosis not present

## 2021-04-12 DIAGNOSIS — N2581 Secondary hyperparathyroidism of renal origin: Secondary | ICD-10-CM | POA: Diagnosis not present

## 2021-04-12 DIAGNOSIS — N186 End stage renal disease: Secondary | ICD-10-CM | POA: Diagnosis not present

## 2021-04-12 DIAGNOSIS — D631 Anemia in chronic kidney disease: Secondary | ICD-10-CM | POA: Diagnosis not present

## 2021-04-13 DIAGNOSIS — N186 End stage renal disease: Secondary | ICD-10-CM | POA: Diagnosis not present

## 2021-04-13 DIAGNOSIS — D631 Anemia in chronic kidney disease: Secondary | ICD-10-CM | POA: Diagnosis not present

## 2021-04-13 DIAGNOSIS — N2581 Secondary hyperparathyroidism of renal origin: Secondary | ICD-10-CM | POA: Diagnosis not present

## 2021-04-14 DIAGNOSIS — N186 End stage renal disease: Secondary | ICD-10-CM | POA: Diagnosis not present

## 2021-04-14 DIAGNOSIS — N2581 Secondary hyperparathyroidism of renal origin: Secondary | ICD-10-CM | POA: Diagnosis not present

## 2021-04-14 DIAGNOSIS — D631 Anemia in chronic kidney disease: Secondary | ICD-10-CM | POA: Diagnosis not present

## 2021-04-15 DIAGNOSIS — N186 End stage renal disease: Secondary | ICD-10-CM | POA: Diagnosis not present

## 2021-04-15 DIAGNOSIS — N2581 Secondary hyperparathyroidism of renal origin: Secondary | ICD-10-CM | POA: Diagnosis not present

## 2021-04-15 DIAGNOSIS — D631 Anemia in chronic kidney disease: Secondary | ICD-10-CM | POA: Diagnosis not present

## 2021-04-16 DIAGNOSIS — N2581 Secondary hyperparathyroidism of renal origin: Secondary | ICD-10-CM | POA: Diagnosis not present

## 2021-04-16 DIAGNOSIS — D631 Anemia in chronic kidney disease: Secondary | ICD-10-CM | POA: Diagnosis not present

## 2021-04-16 DIAGNOSIS — N186 End stage renal disease: Secondary | ICD-10-CM | POA: Diagnosis not present

## 2021-04-17 DIAGNOSIS — D631 Anemia in chronic kidney disease: Secondary | ICD-10-CM | POA: Diagnosis not present

## 2021-04-17 DIAGNOSIS — N186 End stage renal disease: Secondary | ICD-10-CM | POA: Diagnosis not present

## 2021-04-17 DIAGNOSIS — N2581 Secondary hyperparathyroidism of renal origin: Secondary | ICD-10-CM | POA: Diagnosis not present

## 2021-04-18 DIAGNOSIS — D631 Anemia in chronic kidney disease: Secondary | ICD-10-CM | POA: Diagnosis not present

## 2021-04-18 DIAGNOSIS — N186 End stage renal disease: Secondary | ICD-10-CM | POA: Diagnosis not present

## 2021-04-18 DIAGNOSIS — N2581 Secondary hyperparathyroidism of renal origin: Secondary | ICD-10-CM | POA: Diagnosis not present

## 2021-04-19 DIAGNOSIS — N186 End stage renal disease: Secondary | ICD-10-CM | POA: Diagnosis not present

## 2021-04-19 DIAGNOSIS — D631 Anemia in chronic kidney disease: Secondary | ICD-10-CM | POA: Diagnosis not present

## 2021-04-19 DIAGNOSIS — N2581 Secondary hyperparathyroidism of renal origin: Secondary | ICD-10-CM | POA: Diagnosis not present

## 2021-04-20 DIAGNOSIS — N2581 Secondary hyperparathyroidism of renal origin: Secondary | ICD-10-CM | POA: Diagnosis not present

## 2021-04-20 DIAGNOSIS — M4626 Osteomyelitis of vertebra, lumbar region: Secondary | ICD-10-CM | POA: Diagnosis not present

## 2021-04-20 DIAGNOSIS — D631 Anemia in chronic kidney disease: Secondary | ICD-10-CM | POA: Diagnosis not present

## 2021-04-20 DIAGNOSIS — N186 End stage renal disease: Secondary | ICD-10-CM | POA: Diagnosis not present

## 2021-04-21 DIAGNOSIS — N186 End stage renal disease: Secondary | ICD-10-CM | POA: Diagnosis not present

## 2021-04-21 DIAGNOSIS — N2581 Secondary hyperparathyroidism of renal origin: Secondary | ICD-10-CM | POA: Diagnosis not present

## 2021-04-21 DIAGNOSIS — Z992 Dependence on renal dialysis: Secondary | ICD-10-CM | POA: Diagnosis not present

## 2021-04-21 DIAGNOSIS — D631 Anemia in chronic kidney disease: Secondary | ICD-10-CM | POA: Diagnosis not present

## 2021-04-22 DIAGNOSIS — N186 End stage renal disease: Secondary | ICD-10-CM | POA: Diagnosis not present

## 2021-04-22 DIAGNOSIS — D631 Anemia in chronic kidney disease: Secondary | ICD-10-CM | POA: Diagnosis not present

## 2021-04-22 DIAGNOSIS — Z1159 Encounter for screening for other viral diseases: Secondary | ICD-10-CM | POA: Diagnosis not present

## 2021-04-22 DIAGNOSIS — N2581 Secondary hyperparathyroidism of renal origin: Secondary | ICD-10-CM | POA: Diagnosis not present

## 2021-04-23 DIAGNOSIS — N186 End stage renal disease: Secondary | ICD-10-CM | POA: Diagnosis not present

## 2021-04-23 DIAGNOSIS — N2581 Secondary hyperparathyroidism of renal origin: Secondary | ICD-10-CM | POA: Diagnosis not present

## 2021-04-23 DIAGNOSIS — D631 Anemia in chronic kidney disease: Secondary | ICD-10-CM | POA: Diagnosis not present

## 2021-04-24 DIAGNOSIS — N2581 Secondary hyperparathyroidism of renal origin: Secondary | ICD-10-CM | POA: Diagnosis not present

## 2021-04-24 DIAGNOSIS — D631 Anemia in chronic kidney disease: Secondary | ICD-10-CM | POA: Diagnosis not present

## 2021-04-24 DIAGNOSIS — N186 End stage renal disease: Secondary | ICD-10-CM | POA: Diagnosis not present

## 2021-04-25 DIAGNOSIS — N2581 Secondary hyperparathyroidism of renal origin: Secondary | ICD-10-CM | POA: Diagnosis not present

## 2021-04-25 DIAGNOSIS — N186 End stage renal disease: Secondary | ICD-10-CM | POA: Diagnosis not present

## 2021-04-25 DIAGNOSIS — D631 Anemia in chronic kidney disease: Secondary | ICD-10-CM | POA: Diagnosis not present

## 2021-04-26 DIAGNOSIS — D631 Anemia in chronic kidney disease: Secondary | ICD-10-CM | POA: Diagnosis not present

## 2021-04-26 DIAGNOSIS — N2581 Secondary hyperparathyroidism of renal origin: Secondary | ICD-10-CM | POA: Diagnosis not present

## 2021-04-26 DIAGNOSIS — N186 End stage renal disease: Secondary | ICD-10-CM | POA: Diagnosis not present

## 2021-04-27 DIAGNOSIS — D631 Anemia in chronic kidney disease: Secondary | ICD-10-CM | POA: Diagnosis not present

## 2021-04-27 DIAGNOSIS — N186 End stage renal disease: Secondary | ICD-10-CM | POA: Diagnosis not present

## 2021-04-27 DIAGNOSIS — N2581 Secondary hyperparathyroidism of renal origin: Secondary | ICD-10-CM | POA: Diagnosis not present

## 2021-04-28 DIAGNOSIS — N186 End stage renal disease: Secondary | ICD-10-CM | POA: Diagnosis not present

## 2021-04-28 DIAGNOSIS — N2581 Secondary hyperparathyroidism of renal origin: Secondary | ICD-10-CM | POA: Diagnosis not present

## 2021-04-28 DIAGNOSIS — D631 Anemia in chronic kidney disease: Secondary | ICD-10-CM | POA: Diagnosis not present

## 2021-04-29 DIAGNOSIS — N186 End stage renal disease: Secondary | ICD-10-CM | POA: Diagnosis not present

## 2021-04-29 DIAGNOSIS — N2581 Secondary hyperparathyroidism of renal origin: Secondary | ICD-10-CM | POA: Diagnosis not present

## 2021-04-29 DIAGNOSIS — D631 Anemia in chronic kidney disease: Secondary | ICD-10-CM | POA: Diagnosis not present

## 2021-04-30 DIAGNOSIS — D631 Anemia in chronic kidney disease: Secondary | ICD-10-CM | POA: Diagnosis not present

## 2021-04-30 DIAGNOSIS — N186 End stage renal disease: Secondary | ICD-10-CM | POA: Diagnosis not present

## 2021-04-30 DIAGNOSIS — N2581 Secondary hyperparathyroidism of renal origin: Secondary | ICD-10-CM | POA: Diagnosis not present

## 2021-05-01 DIAGNOSIS — N186 End stage renal disease: Secondary | ICD-10-CM | POA: Diagnosis not present

## 2021-05-01 DIAGNOSIS — N2581 Secondary hyperparathyroidism of renal origin: Secondary | ICD-10-CM | POA: Diagnosis not present

## 2021-05-01 DIAGNOSIS — D631 Anemia in chronic kidney disease: Secondary | ICD-10-CM | POA: Diagnosis not present

## 2021-05-02 DIAGNOSIS — H0289 Other specified disorders of eyelid: Secondary | ICD-10-CM | POA: Diagnosis not present

## 2021-05-02 DIAGNOSIS — D631 Anemia in chronic kidney disease: Secondary | ICD-10-CM | POA: Diagnosis not present

## 2021-05-02 DIAGNOSIS — H2513 Age-related nuclear cataract, bilateral: Secondary | ICD-10-CM | POA: Diagnosis not present

## 2021-05-02 DIAGNOSIS — N2581 Secondary hyperparathyroidism of renal origin: Secondary | ICD-10-CM | POA: Diagnosis not present

## 2021-05-02 DIAGNOSIS — N186 End stage renal disease: Secondary | ICD-10-CM | POA: Diagnosis not present

## 2021-05-03 DIAGNOSIS — N2581 Secondary hyperparathyroidism of renal origin: Secondary | ICD-10-CM | POA: Diagnosis not present

## 2021-05-03 DIAGNOSIS — D631 Anemia in chronic kidney disease: Secondary | ICD-10-CM | POA: Diagnosis not present

## 2021-05-03 DIAGNOSIS — N186 End stage renal disease: Secondary | ICD-10-CM | POA: Diagnosis not present

## 2021-05-04 DIAGNOSIS — D631 Anemia in chronic kidney disease: Secondary | ICD-10-CM | POA: Diagnosis not present

## 2021-05-04 DIAGNOSIS — N186 End stage renal disease: Secondary | ICD-10-CM | POA: Diagnosis not present

## 2021-05-04 DIAGNOSIS — N2581 Secondary hyperparathyroidism of renal origin: Secondary | ICD-10-CM | POA: Diagnosis not present

## 2021-05-05 DIAGNOSIS — F71 Moderate intellectual disabilities: Secondary | ICD-10-CM | POA: Diagnosis not present

## 2021-05-05 DIAGNOSIS — F319 Bipolar disorder, unspecified: Secondary | ICD-10-CM | POA: Diagnosis not present

## 2021-05-05 DIAGNOSIS — N2581 Secondary hyperparathyroidism of renal origin: Secondary | ICD-10-CM | POA: Diagnosis not present

## 2021-05-05 DIAGNOSIS — N186 End stage renal disease: Secondary | ICD-10-CM | POA: Diagnosis not present

## 2021-05-05 DIAGNOSIS — F29 Unspecified psychosis not due to a substance or known physiological condition: Secondary | ICD-10-CM | POA: Diagnosis not present

## 2021-05-05 DIAGNOSIS — D631 Anemia in chronic kidney disease: Secondary | ICD-10-CM | POA: Diagnosis not present

## 2021-05-05 DIAGNOSIS — F250 Schizoaffective disorder, bipolar type: Secondary | ICD-10-CM | POA: Diagnosis not present

## 2021-05-06 DIAGNOSIS — N2581 Secondary hyperparathyroidism of renal origin: Secondary | ICD-10-CM | POA: Diagnosis not present

## 2021-05-06 DIAGNOSIS — D631 Anemia in chronic kidney disease: Secondary | ICD-10-CM | POA: Diagnosis not present

## 2021-05-06 DIAGNOSIS — N186 End stage renal disease: Secondary | ICD-10-CM | POA: Diagnosis not present

## 2021-05-07 DIAGNOSIS — D631 Anemia in chronic kidney disease: Secondary | ICD-10-CM | POA: Diagnosis not present

## 2021-05-07 DIAGNOSIS — N2581 Secondary hyperparathyroidism of renal origin: Secondary | ICD-10-CM | POA: Diagnosis not present

## 2021-05-07 DIAGNOSIS — N186 End stage renal disease: Secondary | ICD-10-CM | POA: Diagnosis not present

## 2021-05-08 DIAGNOSIS — N186 End stage renal disease: Secondary | ICD-10-CM | POA: Diagnosis not present

## 2021-05-08 DIAGNOSIS — D631 Anemia in chronic kidney disease: Secondary | ICD-10-CM | POA: Diagnosis not present

## 2021-05-08 DIAGNOSIS — N2581 Secondary hyperparathyroidism of renal origin: Secondary | ICD-10-CM | POA: Diagnosis not present

## 2021-05-09 DIAGNOSIS — N2581 Secondary hyperparathyroidism of renal origin: Secondary | ICD-10-CM | POA: Diagnosis not present

## 2021-05-09 DIAGNOSIS — N186 End stage renal disease: Secondary | ICD-10-CM | POA: Diagnosis not present

## 2021-05-09 DIAGNOSIS — D631 Anemia in chronic kidney disease: Secondary | ICD-10-CM | POA: Diagnosis not present

## 2021-05-10 DIAGNOSIS — N186 End stage renal disease: Secondary | ICD-10-CM | POA: Diagnosis not present

## 2021-05-10 DIAGNOSIS — N2581 Secondary hyperparathyroidism of renal origin: Secondary | ICD-10-CM | POA: Diagnosis not present

## 2021-05-10 DIAGNOSIS — D631 Anemia in chronic kidney disease: Secondary | ICD-10-CM | POA: Diagnosis not present

## 2021-05-11 DIAGNOSIS — N186 End stage renal disease: Secondary | ICD-10-CM | POA: Diagnosis not present

## 2021-05-11 DIAGNOSIS — N2581 Secondary hyperparathyroidism of renal origin: Secondary | ICD-10-CM | POA: Diagnosis not present

## 2021-05-11 DIAGNOSIS — D631 Anemia in chronic kidney disease: Secondary | ICD-10-CM | POA: Diagnosis not present

## 2021-05-12 DIAGNOSIS — N186 End stage renal disease: Secondary | ICD-10-CM | POA: Diagnosis not present

## 2021-05-12 DIAGNOSIS — D631 Anemia in chronic kidney disease: Secondary | ICD-10-CM | POA: Diagnosis not present

## 2021-05-12 DIAGNOSIS — N2581 Secondary hyperparathyroidism of renal origin: Secondary | ICD-10-CM | POA: Diagnosis not present

## 2021-05-13 DIAGNOSIS — N186 End stage renal disease: Secondary | ICD-10-CM | POA: Diagnosis not present

## 2021-05-13 DIAGNOSIS — D631 Anemia in chronic kidney disease: Secondary | ICD-10-CM | POA: Diagnosis not present

## 2021-05-13 DIAGNOSIS — N2581 Secondary hyperparathyroidism of renal origin: Secondary | ICD-10-CM | POA: Diagnosis not present

## 2021-05-14 DIAGNOSIS — N2581 Secondary hyperparathyroidism of renal origin: Secondary | ICD-10-CM | POA: Diagnosis not present

## 2021-05-14 DIAGNOSIS — D631 Anemia in chronic kidney disease: Secondary | ICD-10-CM | POA: Diagnosis not present

## 2021-05-14 DIAGNOSIS — N186 End stage renal disease: Secondary | ICD-10-CM | POA: Diagnosis not present

## 2021-05-15 DIAGNOSIS — N2581 Secondary hyperparathyroidism of renal origin: Secondary | ICD-10-CM | POA: Diagnosis not present

## 2021-05-15 DIAGNOSIS — N186 End stage renal disease: Secondary | ICD-10-CM | POA: Diagnosis not present

## 2021-05-15 DIAGNOSIS — D631 Anemia in chronic kidney disease: Secondary | ICD-10-CM | POA: Diagnosis not present

## 2021-05-16 DIAGNOSIS — N2581 Secondary hyperparathyroidism of renal origin: Secondary | ICD-10-CM | POA: Diagnosis not present

## 2021-05-16 DIAGNOSIS — D631 Anemia in chronic kidney disease: Secondary | ICD-10-CM | POA: Diagnosis not present

## 2021-05-16 DIAGNOSIS — N186 End stage renal disease: Secondary | ICD-10-CM | POA: Diagnosis not present

## 2021-05-17 DIAGNOSIS — N186 End stage renal disease: Secondary | ICD-10-CM | POA: Diagnosis not present

## 2021-05-17 DIAGNOSIS — N2581 Secondary hyperparathyroidism of renal origin: Secondary | ICD-10-CM | POA: Diagnosis not present

## 2021-05-17 DIAGNOSIS — D631 Anemia in chronic kidney disease: Secondary | ICD-10-CM | POA: Diagnosis not present

## 2021-05-18 DIAGNOSIS — D631 Anemia in chronic kidney disease: Secondary | ICD-10-CM | POA: Diagnosis not present

## 2021-05-18 DIAGNOSIS — N186 End stage renal disease: Secondary | ICD-10-CM | POA: Diagnosis not present

## 2021-05-18 DIAGNOSIS — N2581 Secondary hyperparathyroidism of renal origin: Secondary | ICD-10-CM | POA: Diagnosis not present

## 2021-05-19 DIAGNOSIS — N186 End stage renal disease: Secondary | ICD-10-CM | POA: Diagnosis not present

## 2021-05-19 DIAGNOSIS — D631 Anemia in chronic kidney disease: Secondary | ICD-10-CM | POA: Diagnosis not present

## 2021-05-19 DIAGNOSIS — N2581 Secondary hyperparathyroidism of renal origin: Secondary | ICD-10-CM | POA: Diagnosis not present

## 2021-05-20 DIAGNOSIS — N2581 Secondary hyperparathyroidism of renal origin: Secondary | ICD-10-CM | POA: Diagnosis not present

## 2021-05-20 DIAGNOSIS — D631 Anemia in chronic kidney disease: Secondary | ICD-10-CM | POA: Diagnosis not present

## 2021-05-20 DIAGNOSIS — N186 End stage renal disease: Secondary | ICD-10-CM | POA: Diagnosis not present

## 2021-05-21 DIAGNOSIS — Z23 Encounter for immunization: Secondary | ICD-10-CM | POA: Diagnosis not present

## 2021-05-21 DIAGNOSIS — N2581 Secondary hyperparathyroidism of renal origin: Secondary | ICD-10-CM | POA: Diagnosis not present

## 2021-05-21 DIAGNOSIS — D631 Anemia in chronic kidney disease: Secondary | ICD-10-CM | POA: Diagnosis not present

## 2021-05-21 DIAGNOSIS — N186 End stage renal disease: Secondary | ICD-10-CM | POA: Diagnosis not present

## 2021-05-22 DIAGNOSIS — N186 End stage renal disease: Secondary | ICD-10-CM | POA: Diagnosis not present

## 2021-05-22 DIAGNOSIS — N2581 Secondary hyperparathyroidism of renal origin: Secondary | ICD-10-CM | POA: Diagnosis not present

## 2021-05-22 DIAGNOSIS — Z992 Dependence on renal dialysis: Secondary | ICD-10-CM | POA: Diagnosis not present

## 2021-05-22 DIAGNOSIS — D631 Anemia in chronic kidney disease: Secondary | ICD-10-CM | POA: Diagnosis not present

## 2021-05-23 DIAGNOSIS — D631 Anemia in chronic kidney disease: Secondary | ICD-10-CM | POA: Diagnosis not present

## 2021-05-23 DIAGNOSIS — N2581 Secondary hyperparathyroidism of renal origin: Secondary | ICD-10-CM | POA: Diagnosis not present

## 2021-05-23 DIAGNOSIS — N186 End stage renal disease: Secondary | ICD-10-CM | POA: Diagnosis not present

## 2021-05-24 DIAGNOSIS — N2581 Secondary hyperparathyroidism of renal origin: Secondary | ICD-10-CM | POA: Diagnosis not present

## 2021-05-24 DIAGNOSIS — R9439 Abnormal result of other cardiovascular function study: Secondary | ICD-10-CM | POA: Diagnosis not present

## 2021-05-24 DIAGNOSIS — N186 End stage renal disease: Secondary | ICD-10-CM | POA: Diagnosis not present

## 2021-05-24 DIAGNOSIS — H2511 Age-related nuclear cataract, right eye: Secondary | ICD-10-CM | POA: Diagnosis not present

## 2021-05-24 DIAGNOSIS — H0289 Other specified disorders of eyelid: Secondary | ICD-10-CM | POA: Diagnosis not present

## 2021-05-24 DIAGNOSIS — R600 Localized edema: Secondary | ICD-10-CM | POA: Diagnosis not present

## 2021-05-24 DIAGNOSIS — R000 Tachycardia, unspecified: Secondary | ICD-10-CM | POA: Diagnosis not present

## 2021-05-24 DIAGNOSIS — R0602 Shortness of breath: Secondary | ICD-10-CM | POA: Diagnosis not present

## 2021-05-24 DIAGNOSIS — R6250 Unspecified lack of expected normal physiological development in childhood: Secondary | ICD-10-CM | POA: Diagnosis not present

## 2021-05-24 DIAGNOSIS — D631 Anemia in chronic kidney disease: Secondary | ICD-10-CM | POA: Diagnosis not present

## 2021-05-25 DIAGNOSIS — D631 Anemia in chronic kidney disease: Secondary | ICD-10-CM | POA: Diagnosis not present

## 2021-05-25 DIAGNOSIS — N2581 Secondary hyperparathyroidism of renal origin: Secondary | ICD-10-CM | POA: Diagnosis not present

## 2021-05-25 DIAGNOSIS — N186 End stage renal disease: Secondary | ICD-10-CM | POA: Diagnosis not present

## 2021-05-26 DIAGNOSIS — D631 Anemia in chronic kidney disease: Secondary | ICD-10-CM | POA: Diagnosis not present

## 2021-05-26 DIAGNOSIS — N186 End stage renal disease: Secondary | ICD-10-CM | POA: Diagnosis not present

## 2021-05-26 DIAGNOSIS — N2581 Secondary hyperparathyroidism of renal origin: Secondary | ICD-10-CM | POA: Diagnosis not present

## 2021-05-27 DIAGNOSIS — N2581 Secondary hyperparathyroidism of renal origin: Secondary | ICD-10-CM | POA: Diagnosis not present

## 2021-05-27 DIAGNOSIS — N186 End stage renal disease: Secondary | ICD-10-CM | POA: Diagnosis not present

## 2021-05-27 DIAGNOSIS — D631 Anemia in chronic kidney disease: Secondary | ICD-10-CM | POA: Diagnosis not present

## 2021-05-28 DIAGNOSIS — D631 Anemia in chronic kidney disease: Secondary | ICD-10-CM | POA: Diagnosis not present

## 2021-05-28 DIAGNOSIS — N2581 Secondary hyperparathyroidism of renal origin: Secondary | ICD-10-CM | POA: Diagnosis not present

## 2021-05-28 DIAGNOSIS — N186 End stage renal disease: Secondary | ICD-10-CM | POA: Diagnosis not present

## 2021-05-29 DIAGNOSIS — N2581 Secondary hyperparathyroidism of renal origin: Secondary | ICD-10-CM | POA: Diagnosis not present

## 2021-05-29 DIAGNOSIS — N186 End stage renal disease: Secondary | ICD-10-CM | POA: Diagnosis not present

## 2021-05-29 DIAGNOSIS — D631 Anemia in chronic kidney disease: Secondary | ICD-10-CM | POA: Diagnosis not present

## 2021-05-30 DIAGNOSIS — N2581 Secondary hyperparathyroidism of renal origin: Secondary | ICD-10-CM | POA: Diagnosis not present

## 2021-05-30 DIAGNOSIS — D631 Anemia in chronic kidney disease: Secondary | ICD-10-CM | POA: Diagnosis not present

## 2021-05-30 DIAGNOSIS — N186 End stage renal disease: Secondary | ICD-10-CM | POA: Diagnosis not present

## 2021-05-31 DIAGNOSIS — N186 End stage renal disease: Secondary | ICD-10-CM | POA: Diagnosis not present

## 2021-05-31 DIAGNOSIS — D631 Anemia in chronic kidney disease: Secondary | ICD-10-CM | POA: Diagnosis not present

## 2021-05-31 DIAGNOSIS — N2581 Secondary hyperparathyroidism of renal origin: Secondary | ICD-10-CM | POA: Diagnosis not present

## 2021-06-01 DIAGNOSIS — D631 Anemia in chronic kidney disease: Secondary | ICD-10-CM | POA: Diagnosis not present

## 2021-06-01 DIAGNOSIS — N186 End stage renal disease: Secondary | ICD-10-CM | POA: Diagnosis not present

## 2021-06-01 DIAGNOSIS — N2581 Secondary hyperparathyroidism of renal origin: Secondary | ICD-10-CM | POA: Diagnosis not present

## 2021-06-02 DIAGNOSIS — D631 Anemia in chronic kidney disease: Secondary | ICD-10-CM | POA: Diagnosis not present

## 2021-06-02 DIAGNOSIS — N2581 Secondary hyperparathyroidism of renal origin: Secondary | ICD-10-CM | POA: Diagnosis not present

## 2021-06-02 DIAGNOSIS — N186 End stage renal disease: Secondary | ICD-10-CM | POA: Diagnosis not present

## 2021-06-03 DIAGNOSIS — D539 Nutritional anemia, unspecified: Secondary | ICD-10-CM | POA: Diagnosis not present

## 2021-06-03 DIAGNOSIS — R569 Unspecified convulsions: Secondary | ICD-10-CM | POA: Diagnosis not present

## 2021-06-03 DIAGNOSIS — E782 Mixed hyperlipidemia: Secondary | ICD-10-CM | POA: Diagnosis not present

## 2021-06-03 DIAGNOSIS — Z3042 Encounter for surveillance of injectable contraceptive: Secondary | ICD-10-CM | POA: Diagnosis not present

## 2021-06-03 DIAGNOSIS — N186 End stage renal disease: Secondary | ICD-10-CM | POA: Diagnosis not present

## 2021-06-03 DIAGNOSIS — D631 Anemia in chronic kidney disease: Secondary | ICD-10-CM | POA: Diagnosis not present

## 2021-06-03 DIAGNOSIS — N2581 Secondary hyperparathyroidism of renal origin: Secondary | ICD-10-CM | POA: Diagnosis not present

## 2021-06-03 DIAGNOSIS — M5430 Sciatica, unspecified side: Secondary | ICD-10-CM | POA: Diagnosis not present

## 2021-06-03 DIAGNOSIS — E039 Hypothyroidism, unspecified: Secondary | ICD-10-CM | POA: Diagnosis not present

## 2021-06-04 DIAGNOSIS — D631 Anemia in chronic kidney disease: Secondary | ICD-10-CM | POA: Diagnosis not present

## 2021-06-04 DIAGNOSIS — N186 End stage renal disease: Secondary | ICD-10-CM | POA: Diagnosis not present

## 2021-06-04 DIAGNOSIS — N2581 Secondary hyperparathyroidism of renal origin: Secondary | ICD-10-CM | POA: Diagnosis not present

## 2021-06-05 DIAGNOSIS — N2581 Secondary hyperparathyroidism of renal origin: Secondary | ICD-10-CM | POA: Diagnosis not present

## 2021-06-05 DIAGNOSIS — N186 End stage renal disease: Secondary | ICD-10-CM | POA: Diagnosis not present

## 2021-06-05 DIAGNOSIS — D631 Anemia in chronic kidney disease: Secondary | ICD-10-CM | POA: Diagnosis not present

## 2021-06-06 DIAGNOSIS — R0600 Dyspnea, unspecified: Secondary | ICD-10-CM | POA: Diagnosis not present

## 2021-06-06 DIAGNOSIS — N186 End stage renal disease: Secondary | ICD-10-CM | POA: Diagnosis not present

## 2021-06-06 DIAGNOSIS — N2581 Secondary hyperparathyroidism of renal origin: Secondary | ICD-10-CM | POA: Diagnosis not present

## 2021-06-06 DIAGNOSIS — D631 Anemia in chronic kidney disease: Secondary | ICD-10-CM | POA: Diagnosis not present

## 2021-06-06 DIAGNOSIS — I5189 Other ill-defined heart diseases: Secondary | ICD-10-CM | POA: Diagnosis not present

## 2021-06-06 DIAGNOSIS — R600 Localized edema: Secondary | ICD-10-CM | POA: Diagnosis not present

## 2021-06-06 DIAGNOSIS — I501 Left ventricular failure: Secondary | ICD-10-CM | POA: Diagnosis not present

## 2021-06-07 DIAGNOSIS — D631 Anemia in chronic kidney disease: Secondary | ICD-10-CM | POA: Diagnosis not present

## 2021-06-07 DIAGNOSIS — N186 End stage renal disease: Secondary | ICD-10-CM | POA: Diagnosis not present

## 2021-06-07 DIAGNOSIS — N2581 Secondary hyperparathyroidism of renal origin: Secondary | ICD-10-CM | POA: Diagnosis not present

## 2021-06-08 DIAGNOSIS — D631 Anemia in chronic kidney disease: Secondary | ICD-10-CM | POA: Diagnosis not present

## 2021-06-08 DIAGNOSIS — N186 End stage renal disease: Secondary | ICD-10-CM | POA: Diagnosis not present

## 2021-06-08 DIAGNOSIS — N2581 Secondary hyperparathyroidism of renal origin: Secondary | ICD-10-CM | POA: Diagnosis not present

## 2021-06-09 DIAGNOSIS — D631 Anemia in chronic kidney disease: Secondary | ICD-10-CM | POA: Diagnosis not present

## 2021-06-09 DIAGNOSIS — N2581 Secondary hyperparathyroidism of renal origin: Secondary | ICD-10-CM | POA: Diagnosis not present

## 2021-06-09 DIAGNOSIS — N186 End stage renal disease: Secondary | ICD-10-CM | POA: Diagnosis not present

## 2021-06-10 DIAGNOSIS — D631 Anemia in chronic kidney disease: Secondary | ICD-10-CM | POA: Diagnosis not present

## 2021-06-10 DIAGNOSIS — N186 End stage renal disease: Secondary | ICD-10-CM | POA: Diagnosis not present

## 2021-06-10 DIAGNOSIS — N2581 Secondary hyperparathyroidism of renal origin: Secondary | ICD-10-CM | POA: Diagnosis not present

## 2021-06-11 DIAGNOSIS — N2581 Secondary hyperparathyroidism of renal origin: Secondary | ICD-10-CM | POA: Diagnosis not present

## 2021-06-11 DIAGNOSIS — N186 End stage renal disease: Secondary | ICD-10-CM | POA: Diagnosis not present

## 2021-06-11 DIAGNOSIS — D631 Anemia in chronic kidney disease: Secondary | ICD-10-CM | POA: Diagnosis not present

## 2021-06-12 DIAGNOSIS — N186 End stage renal disease: Secondary | ICD-10-CM | POA: Diagnosis not present

## 2021-06-12 DIAGNOSIS — N2581 Secondary hyperparathyroidism of renal origin: Secondary | ICD-10-CM | POA: Diagnosis not present

## 2021-06-12 DIAGNOSIS — D631 Anemia in chronic kidney disease: Secondary | ICD-10-CM | POA: Diagnosis not present

## 2021-06-13 DIAGNOSIS — N2581 Secondary hyperparathyroidism of renal origin: Secondary | ICD-10-CM | POA: Diagnosis not present

## 2021-06-13 DIAGNOSIS — N186 End stage renal disease: Secondary | ICD-10-CM | POA: Diagnosis not present

## 2021-06-13 DIAGNOSIS — D631 Anemia in chronic kidney disease: Secondary | ICD-10-CM | POA: Diagnosis not present

## 2021-06-14 DIAGNOSIS — N2581 Secondary hyperparathyroidism of renal origin: Secondary | ICD-10-CM | POA: Diagnosis not present

## 2021-06-14 DIAGNOSIS — D631 Anemia in chronic kidney disease: Secondary | ICD-10-CM | POA: Diagnosis not present

## 2021-06-14 DIAGNOSIS — N186 End stage renal disease: Secondary | ICD-10-CM | POA: Diagnosis not present

## 2021-06-15 DIAGNOSIS — N186 End stage renal disease: Secondary | ICD-10-CM | POA: Diagnosis not present

## 2021-06-15 DIAGNOSIS — N2581 Secondary hyperparathyroidism of renal origin: Secondary | ICD-10-CM | POA: Diagnosis not present

## 2021-06-15 DIAGNOSIS — D631 Anemia in chronic kidney disease: Secondary | ICD-10-CM | POA: Diagnosis not present

## 2021-06-16 DIAGNOSIS — N186 End stage renal disease: Secondary | ICD-10-CM | POA: Diagnosis not present

## 2021-06-16 DIAGNOSIS — D631 Anemia in chronic kidney disease: Secondary | ICD-10-CM | POA: Diagnosis not present

## 2021-06-16 DIAGNOSIS — N2581 Secondary hyperparathyroidism of renal origin: Secondary | ICD-10-CM | POA: Diagnosis not present

## 2021-06-17 DIAGNOSIS — N186 End stage renal disease: Secondary | ICD-10-CM | POA: Diagnosis not present

## 2021-06-17 DIAGNOSIS — N2581 Secondary hyperparathyroidism of renal origin: Secondary | ICD-10-CM | POA: Diagnosis not present

## 2021-06-17 DIAGNOSIS — D631 Anemia in chronic kidney disease: Secondary | ICD-10-CM | POA: Diagnosis not present

## 2021-06-18 DIAGNOSIS — N186 End stage renal disease: Secondary | ICD-10-CM | POA: Diagnosis not present

## 2021-06-18 DIAGNOSIS — D631 Anemia in chronic kidney disease: Secondary | ICD-10-CM | POA: Diagnosis not present

## 2021-06-18 DIAGNOSIS — N2581 Secondary hyperparathyroidism of renal origin: Secondary | ICD-10-CM | POA: Diagnosis not present

## 2021-06-19 DIAGNOSIS — D631 Anemia in chronic kidney disease: Secondary | ICD-10-CM | POA: Diagnosis not present

## 2021-06-19 DIAGNOSIS — N2581 Secondary hyperparathyroidism of renal origin: Secondary | ICD-10-CM | POA: Diagnosis not present

## 2021-06-19 DIAGNOSIS — N186 End stage renal disease: Secondary | ICD-10-CM | POA: Diagnosis not present

## 2021-06-20 ENCOUNTER — Emergency Department (HOSPITAL_COMMUNITY)
Admission: EM | Admit: 2021-06-20 | Discharge: 2021-06-20 | Disposition: A | Discharge status: Home / Self Care | Payer: Medicare Other | Attending: Emergency Medicine | Admitting: Emergency Medicine

## 2021-06-20 ENCOUNTER — Other Ambulatory Visit: Payer: Self-pay

## 2021-06-20 ENCOUNTER — Encounter (HOSPITAL_COMMUNITY): Payer: Self-pay | Admitting: Emergency Medicine

## 2021-06-20 DIAGNOSIS — Z992 Dependence on renal dialysis: Secondary | ICD-10-CM | POA: Diagnosis not present

## 2021-06-20 DIAGNOSIS — L89899 Pressure ulcer of other site, unspecified stage: Secondary | ICD-10-CM

## 2021-06-20 DIAGNOSIS — Z79899 Other long term (current) drug therapy: Secondary | ICD-10-CM | POA: Insufficient documentation

## 2021-06-20 DIAGNOSIS — D631 Anemia in chronic kidney disease: Secondary | ICD-10-CM | POA: Diagnosis not present

## 2021-06-20 DIAGNOSIS — E039 Hypothyroidism, unspecified: Secondary | ICD-10-CM | POA: Insufficient documentation

## 2021-06-20 DIAGNOSIS — R69 Illness, unspecified: Secondary | ICD-10-CM | POA: Diagnosis not present

## 2021-06-20 DIAGNOSIS — N6489 Other specified disorders of breast: Secondary | ICD-10-CM | POA: Diagnosis not present

## 2021-06-20 DIAGNOSIS — N6459 Other signs and symptoms in breast: Secondary | ICD-10-CM | POA: Diagnosis not present

## 2021-06-20 DIAGNOSIS — N186 End stage renal disease: Secondary | ICD-10-CM | POA: Diagnosis not present

## 2021-06-20 DIAGNOSIS — N2581 Secondary hyperparathyroidism of renal origin: Secondary | ICD-10-CM | POA: Diagnosis not present

## 2021-06-20 DIAGNOSIS — I120 Hypertensive chronic kidney disease with stage 5 chronic kidney disease or end stage renal disease: Secondary | ICD-10-CM | POA: Diagnosis not present

## 2021-06-20 MED ORDER — DOXYCYCLINE HYCLATE 100 MG PO CAPS: 100.0000 mg | ORAL_CAPSULE | Freq: Two times a day (BID) | ORAL | 0 refills | Status: AC

## 2021-06-20 NOTE — Discharge Instructions (Addendum)
Please administer antibiotics as directed.  You will need to have the patient follow-up with her regular doctor in the next week for reassessment.  You were also given a referral to wound care.  She likely needs to get established with wound care and follow with them for the duration of the treatment of this wound.  If she has any new or worsening symptoms in the meantime please return to the emergency department immediately.

## 2021-06-20 NOTE — ED Triage Notes (Signed)
Per EMS- Patient has schizophrenia and is combative and agitated which is her baseline.  Patient is here for a pressure ulcer under the left breast.

## 2021-06-20 NOTE — ED Provider Notes (Signed)
[name] DEPT Provider Note   CSN: [id number] Arrival date & time: 06/20/21  1513     History Chief Complaint  Patient presents with   Pressure Ulcer    Jane [name] is a 55 y.o. female.  HPI  55 year old female with a history of abnormal gait, anemia, bipolar disorder, depression, ESRD, GERD, hyperlipidemia, hypertension, renal disease, schizophrenia, who presents to the emergency department today for evaluation of pressure ulcer to the left breast.  Unclear how long this has been present as patient is not a reliable historian and son is unaware of how long it has been present.  He states that patient washes her upper body independently.  She has had no reported fevers at home.  She is complaining of pain to the area.  Past Medical History:  Diagnosis Date   Abnormal gait    Anemia    Bipolar disorder ([place])    Depression    ESRD on peritoneal dialysis ([place])    GERD (gastroesophageal reflux disease)    Hyperlipemia    Hypertension    Hypothyroidism    Moderately mentally retarded    Renal disease    Schizophrenia ([place])    Seizures ([place])    No recent seizures - no meds    Patient Active Problem List   Diagnosis Date Noted   Pressure injury of skin 01/26/2021   Discitis of lumbosacral region 01/25/2021   Anemia 12/06/2018   Slurred speech 10/07/2017   OSA (obstructive sleep apnea) 07/25/2017   Seizure disorder ([place]) 04/22/2016   End-stage renal disease on peritoneal dialysis ([place])    Partial seizure ([place])    Seizure ([place]) 09/14/2015   Essential hypertension 10/11/2010   FOOT PAIN, RIGHT 10/11/2010   BRONCHITIS, ACUTE WITH MILD BRONCHOSPASM 10/04/2010   ANKLE SPRAIN, RIGHT 02/20/2010   WEIGHT GAIN 06/17/2009   Dermatophytosis of foot 04/16/2009   VAGINAL DISCHARGE 04/16/2009   INSOMNIA 04/16/2009   POLYDIPSIA 04/16/2009   FREQUENCY, URINARY 04/16/2009   ANEMIA, IRON DEFICIENCY 12/03/2007   DYSLIPIDEMIA 06/11/2007    OBESITY NOS 06/11/2007   Schizoaffective disorder ([place]) 06/11/2007   DISORDER, BIPOLAR NOS 06/11/2007   RETARDATION, MENTAL NOS 06/11/2007   RENAL INSUFFICIENCY, CHRONIC 06/11/2007   ACNE NEC 06/11/2007   URINARY INCONTINENCE, URGE 06/11/2007   REDUCTION MAMMOPLASTY, HX OF 06/11/2007    Past Surgical History:  Procedure Laterality Date   BREAST REDUCTION SURGERY     BREAST SURGERY     breast reduction   EXAMINATION UNDER ANESTHESIA N/A 12/08/2013   Procedure: EXAM UNDER ANESTHESIA;  Surgeon: Lavonia Drafts, MD;  Location: [facility];  Service: Gynecology;  Laterality: N/A;  Pelvic exam and pap smear, unable to tolerate during last office visit   FOOT SURGERY     right   IR FLUORO GUIDE CV LINE RIGHT  02/01/2021   IR REMOVAL TUN CV CATH W/O FL  03/15/2021   IR US GUIDE VASC ACCESS RIGHT  02/01/2021   LUMBAR LAMINECTOMY/DECOMPRESSION MICRODISCECTOMY N/A 01/25/2021   Procedure: Lumbar Five -Sacral One Laminectomy for Epidural Abscess;  Surgeon: Earnie Larsson, MD;  Location: [facility];  Service: Neurosurgery;  Laterality: N/A;   REDUCTION MAMMAPLASTY Bilateral      OB History     Gravida  0   Para  0   Term  0   Preterm  0   AB  0   Living  0      SAB  0   IAB  0   Ectopic  0   Multiple  0   Live Births              Family History  Problem Relation Age of Onset   Hypertension Other    Breast cancer Mother 55    Social History   Tobacco Use   Smoking status: Never   Smokeless tobacco: Never  Substance Use Topics   Alcohol use: No   Drug use: No    Home Medications Prior to Admission medications   Medication Sig Start Date End Date Taking? Authorizing Provider  doxycycline (VIBRAMYCIN) 100 MG capsule Take 1 capsule (100 mg total) by mouth 2 (two) times daily for 7 days. 06/20/21 06/27/21 Yes Jalasia Eskridge S, PA-C  albuterol (PROVENTIL HFA;VENTOLIN HFA) 108 (90 Base) MCG/ACT inhaler Inhale 2 puffs into the lungs every 6 (six) hours as needed for wheezing  or shortness of breath.    [provider]  B Complex-C-Folic Acid (DIALYVITE 970) 0.8 MG TABS Take 0.8 mg by mouth daily.     [provider]  benztropine (COGENTIN) 1 MG tablet Take 1 mg by mouth at bedtime.    [provider]  cephALEXin (KEFLEX) 500 MG capsule Take 1 capsule (500 mg total) by mouth 4 (four) times daily. 03/11/21   Thayer Headings, MD  cinacalcet (SENSIPAR) 30 MG tablet Take 30-60 mg by mouth See admin instructions. Take 1 tablet (30 mg) by mouth with breakfast and 2 tablets (60 mg) with supper    [provider]  diclofenac Sodium (VOLTAREN) 1 % GEL Apply 2-4 g topically 4 (four) times daily as needed. 12/27/20   [provider]  divalproex (DEPAKOTE) 250 MG DR tablet Take 3 tablets (750 mg total) by mouth 2 (two) times daily. 02/04/21 03/06/21  Mariel Aloe, MD  Docusate Sodium (DSS) 100 MG CAPS Take 100 mg by mouth daily as needed (constipation).    [provider]  feeding supplement (ENSURE ENLIVE / ENSURE PLUS) LIQD Take 237 mLs by mouth 2 (two) times daily between meals. 02/05/21   Mariel Aloe, MD  FLUoxetine (PROZAC) 20 MG capsule Take 20 mg by mouth daily. 07/15/20   [provider]  HYDROcodone-acetaminophen (NORCO/VICODIN) 5-325 MG tablet Take 1 tablet by mouth 4 (four) times daily as needed. 12/02/20   [provider]  iron polysaccharides (NIFEREX) 150 MG capsule Take 150 mg by mouth every other day.    [provider]  levETIRAcetam (KEPPRA) 500 MG tablet Take 1 tablet (500 mg total) by mouth 2 (two) times daily. 09/17/15   Charlynne Cousins, MD  levothyroxine (SYNTHROID, LEVOTHROID) 50 MCG tablet Take 50 mcg by mouth daily with breakfast. 10/05/17   [provider]  losartan (COZAAR) 25 MG tablet Take 25 mg by mouth daily. 04/14/20   [provider]  medroxyPROGESTERone (DEPO-PROVERA) 150 MG/ML injection Inject 150 mg into the muscle every 3 (three) months.     [provider]  omeprazole (PRILOSEC) 40 MG capsule Take 40 mg by mouth daily. 04/14/20   [provider]  potassium chloride SA (K-DUR,KLOR-CON) 20 MEQ tablet Take 20 mEq by mouth daily with breakfast.     [provider]  risperiDONE (RISPERDAL) 2 MG tablet Take 2 mg by mouth at bedtime.    [provider]  sevelamer carbonate (RENVELA) 800 MG tablet Take 800 mg by mouth See admin instructions. Take 1 tablet (800 mg) by mouth with meals and snacks    [provider]  vitamin B-12 (CYANOCOBALAMIN) 500 MCG tablet Take 500 mcg by mouth daily.    [provider]    Allergies    Icodextrin  Review of Systems   Review of Systems  Constitutional:  Negative for fever.  Eyes:  Negative for visual disturbance.  Respiratory:  Negative for shortness of breath.   Cardiovascular:  Negative for chest pain.  Gastrointestinal:  Negative for abdominal pain.  Genitourinary:  Negative for pelvic pain.  Musculoskeletal:  Negative for back pain.  Skin:  Positive for wound.   Physical Exam Updated Vital Signs BP 107/88 (BP Location: Right Arm)   Pulse 93   Temp 98.3 F (36.8 C) (Oral)   Resp 19   Ht 5\' 6"  (1.676 m)   Wt 58.9 kg   SpO2 100%   BMI 20.96 kg/m   Physical Exam Vitals and nursing note reviewed.  Constitutional:      General: She is not in acute distress.    Appearance: She is well-developed.  HENT:     Head: Normocephalic and atraumatic.  Eyes:     Conjunctiva/sclera: Conjunctivae normal.  Cardiovascular:     Rate and Rhythm: Normal rate.  Pulmonary:     Effort: Pulmonary effort is normal.  Musculoskeletal:        General: Normal range of motion.     Cervical back: Neck supple.  Skin:    General: Skin is warm and dry.     Comments: Ulceration noted to the left breast without surrounding erythema or induration. There is no active drainage from the wound at this time  Neurological:     Mental Status: She is alert.      ED Results / Procedures / Treatments   Labs (all labs ordered are listed, but only abnormal results are displayed) Labs Reviewed - No data to display  EKG None  Radiology No results found.  Procedures Procedures   Medications Ordered in ED Medications - No data to display  ED Course  I have reviewed the triage vital signs and the nursing notes.  Pertinent labs & imaging results that were available during my care of the patient were reviewed by me and considered in my medical decision making (see chart for details).    MDM Rules/Calculators/A&P                          55 year old female presenting to the emergency department today for evaluation of left breast pain and ulceration.  She has an ulcer on exam with no significant surrounding erythema or induration.  There is no current drainage from the wound.  Patient has had no fevers at home.  Think that this can be treated with antibiotics but I think long-term that she needs to follow-up with wound care.  I advised the son that she needs to follow-up with her PCP next week and have also given them referral for wound care.  Have advised that she return to the ED for any new or worsening symptoms.  He voiced understand the plan and reasons to return.  All questions answered. patient stable for discharge.    Final Clinical Impression(s) / ED Diagnoses Final diagnoses:  Pressure ulcer of breast    Rx / DC Orders ED Discharge Orders          Ordered    doxycycline (VIBRAMYCIN) 100 MG capsule  2 times daily        06/20/21 1653  Bishop Dublin 06/20/21 1654    Lacretia Leigh, MD 06/21/21 [phone]

## 2021-06-20 NOTE — ED Notes (Signed)
PA-C at the bedside.  ?

## 2021-06-21 DIAGNOSIS — R000 Tachycardia, unspecified: Secondary | ICD-10-CM | POA: Diagnosis not present

## 2021-06-21 DIAGNOSIS — I120 Hypertensive chronic kidney disease with stage 5 chronic kidney disease or end stage renal disease: Secondary | ICD-10-CM | POA: Diagnosis not present

## 2021-06-21 DIAGNOSIS — N186 End stage renal disease: Secondary | ICD-10-CM | POA: Diagnosis not present

## 2021-06-21 DIAGNOSIS — R600 Localized edema: Secondary | ICD-10-CM | POA: Diagnosis not present

## 2021-06-21 DIAGNOSIS — Z992 Dependence on renal dialysis: Secondary | ICD-10-CM | POA: Diagnosis not present

## 2021-06-21 DIAGNOSIS — E785 Hyperlipidemia, unspecified: Secondary | ICD-10-CM | POA: Diagnosis not present

## 2021-06-21 DIAGNOSIS — D631 Anemia in chronic kidney disease: Secondary | ICD-10-CM | POA: Diagnosis not present

## 2021-06-21 DIAGNOSIS — N2581 Secondary hyperparathyroidism of renal origin: Secondary | ICD-10-CM | POA: Diagnosis not present

## 2021-06-21 DIAGNOSIS — Z79899 Other long term (current) drug therapy: Secondary | ICD-10-CM | POA: Diagnosis not present

## 2021-06-22 DIAGNOSIS — Z992 Dependence on renal dialysis: Secondary | ICD-10-CM | POA: Diagnosis not present

## 2021-06-22 DIAGNOSIS — D631 Anemia in chronic kidney disease: Secondary | ICD-10-CM | POA: Diagnosis not present

## 2021-06-22 DIAGNOSIS — N186 End stage renal disease: Secondary | ICD-10-CM | POA: Diagnosis not present

## 2021-06-22 DIAGNOSIS — N2581 Secondary hyperparathyroidism of renal origin: Secondary | ICD-10-CM | POA: Diagnosis not present

## 2021-06-23 DIAGNOSIS — N2581 Secondary hyperparathyroidism of renal origin: Secondary | ICD-10-CM | POA: Diagnosis not present

## 2021-06-23 DIAGNOSIS — N186 End stage renal disease: Secondary | ICD-10-CM | POA: Diagnosis not present

## 2021-06-23 DIAGNOSIS — D631 Anemia in chronic kidney disease: Secondary | ICD-10-CM | POA: Diagnosis not present

## 2021-06-24 DIAGNOSIS — N2581 Secondary hyperparathyroidism of renal origin: Secondary | ICD-10-CM | POA: Diagnosis not present

## 2021-06-24 DIAGNOSIS — N186 End stage renal disease: Secondary | ICD-10-CM | POA: Diagnosis not present

## 2021-06-24 DIAGNOSIS — D631 Anemia in chronic kidney disease: Secondary | ICD-10-CM | POA: Diagnosis not present

## 2021-06-25 DIAGNOSIS — N186 End stage renal disease: Secondary | ICD-10-CM | POA: Diagnosis not present

## 2021-06-25 DIAGNOSIS — N2581 Secondary hyperparathyroidism of renal origin: Secondary | ICD-10-CM | POA: Diagnosis not present

## 2021-06-25 DIAGNOSIS — D631 Anemia in chronic kidney disease: Secondary | ICD-10-CM | POA: Diagnosis not present

## 2021-06-26 DIAGNOSIS — N186 End stage renal disease: Secondary | ICD-10-CM | POA: Diagnosis not present

## 2021-06-26 DIAGNOSIS — D631 Anemia in chronic kidney disease: Secondary | ICD-10-CM | POA: Diagnosis not present

## 2021-06-26 DIAGNOSIS — N2581 Secondary hyperparathyroidism of renal origin: Secondary | ICD-10-CM | POA: Diagnosis not present

## 2021-06-27 DIAGNOSIS — D631 Anemia in chronic kidney disease: Secondary | ICD-10-CM | POA: Diagnosis not present

## 2021-06-27 DIAGNOSIS — N2581 Secondary hyperparathyroidism of renal origin: Secondary | ICD-10-CM | POA: Diagnosis not present

## 2021-06-27 DIAGNOSIS — N186 End stage renal disease: Secondary | ICD-10-CM | POA: Diagnosis not present

## 2021-06-28 DIAGNOSIS — N2581 Secondary hyperparathyroidism of renal origin: Secondary | ICD-10-CM | POA: Diagnosis not present

## 2021-06-28 DIAGNOSIS — N186 End stage renal disease: Secondary | ICD-10-CM | POA: Diagnosis not present

## 2021-06-28 DIAGNOSIS — D631 Anemia in chronic kidney disease: Secondary | ICD-10-CM | POA: Diagnosis not present

## 2021-06-29 DIAGNOSIS — N186 End stage renal disease: Secondary | ICD-10-CM | POA: Diagnosis not present

## 2021-06-29 DIAGNOSIS — D631 Anemia in chronic kidney disease: Secondary | ICD-10-CM | POA: Diagnosis not present

## 2021-06-29 DIAGNOSIS — N2581 Secondary hyperparathyroidism of renal origin: Secondary | ICD-10-CM | POA: Diagnosis not present

## 2021-06-30 DIAGNOSIS — N186 End stage renal disease: Secondary | ICD-10-CM | POA: Diagnosis not present

## 2021-06-30 DIAGNOSIS — N2581 Secondary hyperparathyroidism of renal origin: Secondary | ICD-10-CM | POA: Diagnosis not present

## 2021-06-30 DIAGNOSIS — D631 Anemia in chronic kidney disease: Secondary | ICD-10-CM | POA: Diagnosis not present

## 2021-07-01 DIAGNOSIS — N2581 Secondary hyperparathyroidism of renal origin: Secondary | ICD-10-CM | POA: Diagnosis not present

## 2021-07-01 DIAGNOSIS — Z114 Encounter for screening for human immunodeficiency virus [HIV]: Secondary | ICD-10-CM | POA: Diagnosis not present

## 2021-07-01 DIAGNOSIS — N186 End stage renal disease: Secondary | ICD-10-CM | POA: Diagnosis not present

## 2021-07-01 DIAGNOSIS — D631 Anemia in chronic kidney disease: Secondary | ICD-10-CM | POA: Diagnosis not present

## 2021-07-02 DIAGNOSIS — N2581 Secondary hyperparathyroidism of renal origin: Secondary | ICD-10-CM | POA: Diagnosis not present

## 2021-07-02 DIAGNOSIS — N186 End stage renal disease: Secondary | ICD-10-CM | POA: Diagnosis not present

## 2021-07-02 DIAGNOSIS — D631 Anemia in chronic kidney disease: Secondary | ICD-10-CM | POA: Diagnosis not present

## 2021-07-03 DIAGNOSIS — N186 End stage renal disease: Secondary | ICD-10-CM | POA: Diagnosis not present

## 2021-07-03 DIAGNOSIS — D631 Anemia in chronic kidney disease: Secondary | ICD-10-CM | POA: Diagnosis not present

## 2021-07-03 DIAGNOSIS — N2581 Secondary hyperparathyroidism of renal origin: Secondary | ICD-10-CM | POA: Diagnosis not present

## 2021-07-04 DIAGNOSIS — D631 Anemia in chronic kidney disease: Secondary | ICD-10-CM | POA: Diagnosis not present

## 2021-07-04 DIAGNOSIS — N2581 Secondary hyperparathyroidism of renal origin: Secondary | ICD-10-CM | POA: Diagnosis not present

## 2021-07-04 DIAGNOSIS — N186 End stage renal disease: Secondary | ICD-10-CM | POA: Diagnosis not present

## 2021-07-05 DIAGNOSIS — N2581 Secondary hyperparathyroidism of renal origin: Secondary | ICD-10-CM | POA: Diagnosis not present

## 2021-07-05 DIAGNOSIS — D631 Anemia in chronic kidney disease: Secondary | ICD-10-CM | POA: Diagnosis not present

## 2021-07-05 DIAGNOSIS — N186 End stage renal disease: Secondary | ICD-10-CM | POA: Diagnosis not present

## 2021-07-06 DIAGNOSIS — N186 End stage renal disease: Secondary | ICD-10-CM | POA: Diagnosis not present

## 2021-07-06 DIAGNOSIS — D631 Anemia in chronic kidney disease: Secondary | ICD-10-CM | POA: Diagnosis not present

## 2021-07-06 DIAGNOSIS — N2581 Secondary hyperparathyroidism of renal origin: Secondary | ICD-10-CM | POA: Diagnosis not present

## 2021-07-07 DIAGNOSIS — N2581 Secondary hyperparathyroidism of renal origin: Secondary | ICD-10-CM | POA: Diagnosis not present

## 2021-07-07 DIAGNOSIS — D631 Anemia in chronic kidney disease: Secondary | ICD-10-CM | POA: Diagnosis not present

## 2021-07-07 DIAGNOSIS — N186 End stage renal disease: Secondary | ICD-10-CM | POA: Diagnosis not present

## 2021-07-08 DIAGNOSIS — D631 Anemia in chronic kidney disease: Secondary | ICD-10-CM | POA: Diagnosis not present

## 2021-07-08 DIAGNOSIS — N2581 Secondary hyperparathyroidism of renal origin: Secondary | ICD-10-CM | POA: Diagnosis not present

## 2021-07-08 DIAGNOSIS — N186 End stage renal disease: Secondary | ICD-10-CM | POA: Diagnosis not present

## 2021-07-09 DIAGNOSIS — N186 End stage renal disease: Secondary | ICD-10-CM | POA: Diagnosis not present

## 2021-07-09 DIAGNOSIS — N2581 Secondary hyperparathyroidism of renal origin: Secondary | ICD-10-CM | POA: Diagnosis not present

## 2021-07-09 DIAGNOSIS — D631 Anemia in chronic kidney disease: Secondary | ICD-10-CM | POA: Diagnosis not present

## 2021-07-10 DIAGNOSIS — N2581 Secondary hyperparathyroidism of renal origin: Secondary | ICD-10-CM | POA: Diagnosis not present

## 2021-07-10 DIAGNOSIS — N186 End stage renal disease: Secondary | ICD-10-CM | POA: Diagnosis not present

## 2021-07-10 DIAGNOSIS — D631 Anemia in chronic kidney disease: Secondary | ICD-10-CM | POA: Diagnosis not present

## 2021-07-11 DIAGNOSIS — D631 Anemia in chronic kidney disease: Secondary | ICD-10-CM | POA: Diagnosis not present

## 2021-07-11 DIAGNOSIS — N186 End stage renal disease: Secondary | ICD-10-CM | POA: Diagnosis not present

## 2021-07-11 DIAGNOSIS — N2581 Secondary hyperparathyroidism of renal origin: Secondary | ICD-10-CM | POA: Diagnosis not present

## 2021-07-12 DIAGNOSIS — N2581 Secondary hyperparathyroidism of renal origin: Secondary | ICD-10-CM | POA: Diagnosis not present

## 2021-07-12 DIAGNOSIS — D631 Anemia in chronic kidney disease: Secondary | ICD-10-CM | POA: Diagnosis not present

## 2021-07-12 DIAGNOSIS — N186 End stage renal disease: Secondary | ICD-10-CM | POA: Diagnosis not present

## 2021-07-13 DIAGNOSIS — N2581 Secondary hyperparathyroidism of renal origin: Secondary | ICD-10-CM | POA: Diagnosis not present

## 2021-07-13 DIAGNOSIS — D631 Anemia in chronic kidney disease: Secondary | ICD-10-CM | POA: Diagnosis not present

## 2021-07-13 DIAGNOSIS — N186 End stage renal disease: Secondary | ICD-10-CM | POA: Diagnosis not present

## 2021-07-14 DIAGNOSIS — N186 End stage renal disease: Secondary | ICD-10-CM | POA: Diagnosis not present

## 2021-07-14 DIAGNOSIS — D631 Anemia in chronic kidney disease: Secondary | ICD-10-CM | POA: Diagnosis not present

## 2021-07-14 DIAGNOSIS — N2581 Secondary hyperparathyroidism of renal origin: Secondary | ICD-10-CM | POA: Diagnosis not present

## 2021-07-15 DIAGNOSIS — D631 Anemia in chronic kidney disease: Secondary | ICD-10-CM | POA: Diagnosis not present

## 2021-07-15 DIAGNOSIS — N2581 Secondary hyperparathyroidism of renal origin: Secondary | ICD-10-CM | POA: Diagnosis not present

## 2021-07-15 DIAGNOSIS — N186 End stage renal disease: Secondary | ICD-10-CM | POA: Diagnosis not present

## 2021-07-16 DIAGNOSIS — N186 End stage renal disease: Secondary | ICD-10-CM | POA: Diagnosis not present

## 2021-07-16 DIAGNOSIS — N2581 Secondary hyperparathyroidism of renal origin: Secondary | ICD-10-CM | POA: Diagnosis not present

## 2021-07-16 DIAGNOSIS — D631 Anemia in chronic kidney disease: Secondary | ICD-10-CM | POA: Diagnosis not present

## 2021-07-17 DIAGNOSIS — N2581 Secondary hyperparathyroidism of renal origin: Secondary | ICD-10-CM | POA: Diagnosis not present

## 2021-07-17 DIAGNOSIS — D631 Anemia in chronic kidney disease: Secondary | ICD-10-CM | POA: Diagnosis not present

## 2021-07-17 DIAGNOSIS — N186 End stage renal disease: Secondary | ICD-10-CM | POA: Diagnosis not present

## 2021-07-18 DIAGNOSIS — N186 End stage renal disease: Secondary | ICD-10-CM | POA: Diagnosis not present

## 2021-07-18 DIAGNOSIS — D631 Anemia in chronic kidney disease: Secondary | ICD-10-CM | POA: Diagnosis not present

## 2021-07-18 DIAGNOSIS — N2581 Secondary hyperparathyroidism of renal origin: Secondary | ICD-10-CM | POA: Diagnosis not present

## 2021-07-19 DIAGNOSIS — N186 End stage renal disease: Secondary | ICD-10-CM | POA: Diagnosis not present

## 2021-07-19 DIAGNOSIS — N2581 Secondary hyperparathyroidism of renal origin: Secondary | ICD-10-CM | POA: Diagnosis not present

## 2021-07-19 DIAGNOSIS — D631 Anemia in chronic kidney disease: Secondary | ICD-10-CM | POA: Diagnosis not present

## 2021-07-20 DIAGNOSIS — N2581 Secondary hyperparathyroidism of renal origin: Secondary | ICD-10-CM | POA: Diagnosis not present

## 2021-07-20 DIAGNOSIS — D631 Anemia in chronic kidney disease: Secondary | ICD-10-CM | POA: Diagnosis not present

## 2021-07-20 DIAGNOSIS — N186 End stage renal disease: Secondary | ICD-10-CM | POA: Diagnosis not present

## 2021-07-21 DIAGNOSIS — N2581 Secondary hyperparathyroidism of renal origin: Secondary | ICD-10-CM | POA: Diagnosis not present

## 2021-07-21 DIAGNOSIS — D631 Anemia in chronic kidney disease: Secondary | ICD-10-CM | POA: Diagnosis not present

## 2021-07-21 DIAGNOSIS — N186 End stage renal disease: Secondary | ICD-10-CM | POA: Diagnosis not present

## 2021-07-22 DIAGNOSIS — Z992 Dependence on renal dialysis: Secondary | ICD-10-CM | POA: Diagnosis not present

## 2021-07-22 DIAGNOSIS — D631 Anemia in chronic kidney disease: Secondary | ICD-10-CM | POA: Diagnosis not present

## 2021-07-22 DIAGNOSIS — N2581 Secondary hyperparathyroidism of renal origin: Secondary | ICD-10-CM | POA: Diagnosis not present

## 2021-07-22 DIAGNOSIS — N186 End stage renal disease: Secondary | ICD-10-CM | POA: Diagnosis not present

## 2021-07-23 DIAGNOSIS — N186 End stage renal disease: Secondary | ICD-10-CM | POA: Diagnosis not present

## 2021-07-23 DIAGNOSIS — D631 Anemia in chronic kidney disease: Secondary | ICD-10-CM | POA: Diagnosis not present

## 2021-07-23 DIAGNOSIS — N2581 Secondary hyperparathyroidism of renal origin: Secondary | ICD-10-CM | POA: Diagnosis not present

## 2021-07-24 DIAGNOSIS — D631 Anemia in chronic kidney disease: Secondary | ICD-10-CM | POA: Diagnosis not present

## 2021-07-24 DIAGNOSIS — N186 End stage renal disease: Secondary | ICD-10-CM | POA: Diagnosis not present

## 2021-07-24 DIAGNOSIS — N2581 Secondary hyperparathyroidism of renal origin: Secondary | ICD-10-CM | POA: Diagnosis not present

## 2021-07-25 DIAGNOSIS — N2581 Secondary hyperparathyroidism of renal origin: Secondary | ICD-10-CM | POA: Diagnosis not present

## 2021-07-25 DIAGNOSIS — D631 Anemia in chronic kidney disease: Secondary | ICD-10-CM | POA: Diagnosis not present

## 2021-07-25 DIAGNOSIS — N186 End stage renal disease: Secondary | ICD-10-CM | POA: Diagnosis not present

## 2021-07-26 DIAGNOSIS — N186 End stage renal disease: Secondary | ICD-10-CM | POA: Diagnosis not present

## 2021-07-26 DIAGNOSIS — N2581 Secondary hyperparathyroidism of renal origin: Secondary | ICD-10-CM | POA: Diagnosis not present

## 2021-07-26 DIAGNOSIS — D631 Anemia in chronic kidney disease: Secondary | ICD-10-CM | POA: Diagnosis not present

## 2021-07-27 DIAGNOSIS — D631 Anemia in chronic kidney disease: Secondary | ICD-10-CM | POA: Diagnosis not present

## 2021-07-27 DIAGNOSIS — N2581 Secondary hyperparathyroidism of renal origin: Secondary | ICD-10-CM | POA: Diagnosis not present

## 2021-07-27 DIAGNOSIS — N186 End stage renal disease: Secondary | ICD-10-CM | POA: Diagnosis not present

## 2021-07-28 DIAGNOSIS — N2581 Secondary hyperparathyroidism of renal origin: Secondary | ICD-10-CM | POA: Diagnosis not present

## 2021-07-28 DIAGNOSIS — D631 Anemia in chronic kidney disease: Secondary | ICD-10-CM | POA: Diagnosis not present

## 2021-07-28 DIAGNOSIS — N186 End stage renal disease: Secondary | ICD-10-CM | POA: Diagnosis not present

## 2021-07-29 DIAGNOSIS — N186 End stage renal disease: Secondary | ICD-10-CM | POA: Diagnosis not present

## 2021-07-29 DIAGNOSIS — N2581 Secondary hyperparathyroidism of renal origin: Secondary | ICD-10-CM | POA: Diagnosis not present

## 2021-07-29 DIAGNOSIS — D631 Anemia in chronic kidney disease: Secondary | ICD-10-CM | POA: Diagnosis not present

## 2021-07-30 DIAGNOSIS — N186 End stage renal disease: Secondary | ICD-10-CM | POA: Diagnosis not present

## 2021-07-30 DIAGNOSIS — N2581 Secondary hyperparathyroidism of renal origin: Secondary | ICD-10-CM | POA: Diagnosis not present

## 2021-07-30 DIAGNOSIS — D631 Anemia in chronic kidney disease: Secondary | ICD-10-CM | POA: Diagnosis not present

## 2021-07-31 DIAGNOSIS — N186 End stage renal disease: Secondary | ICD-10-CM | POA: Diagnosis not present

## 2021-07-31 DIAGNOSIS — N2581 Secondary hyperparathyroidism of renal origin: Secondary | ICD-10-CM | POA: Diagnosis not present

## 2021-07-31 DIAGNOSIS — D631 Anemia in chronic kidney disease: Secondary | ICD-10-CM | POA: Diagnosis not present

## 2021-08-01 ENCOUNTER — Encounter (HOSPITAL_BASED_OUTPATIENT_CLINIC_OR_DEPARTMENT_OTHER): Payer: Medicare Other | Attending: Internal Medicine | Admitting: Internal Medicine

## 2021-08-01 DIAGNOSIS — D631 Anemia in chronic kidney disease: Secondary | ICD-10-CM | POA: Diagnosis not present

## 2021-08-01 DIAGNOSIS — N186 End stage renal disease: Secondary | ICD-10-CM | POA: Diagnosis not present

## 2021-08-01 DIAGNOSIS — N2581 Secondary hyperparathyroidism of renal origin: Secondary | ICD-10-CM | POA: Diagnosis not present

## 2021-08-02 DIAGNOSIS — N2581 Secondary hyperparathyroidism of renal origin: Secondary | ICD-10-CM | POA: Diagnosis not present

## 2021-08-02 DIAGNOSIS — N186 End stage renal disease: Secondary | ICD-10-CM | POA: Diagnosis not present

## 2021-08-02 DIAGNOSIS — D631 Anemia in chronic kidney disease: Secondary | ICD-10-CM | POA: Diagnosis not present

## 2021-08-03 DIAGNOSIS — D631 Anemia in chronic kidney disease: Secondary | ICD-10-CM | POA: Diagnosis not present

## 2021-08-03 DIAGNOSIS — N186 End stage renal disease: Secondary | ICD-10-CM | POA: Diagnosis not present

## 2021-08-03 DIAGNOSIS — N2581 Secondary hyperparathyroidism of renal origin: Secondary | ICD-10-CM | POA: Diagnosis not present

## 2021-08-04 DIAGNOSIS — D631 Anemia in chronic kidney disease: Secondary | ICD-10-CM | POA: Diagnosis not present

## 2021-08-04 DIAGNOSIS — N186 End stage renal disease: Secondary | ICD-10-CM | POA: Diagnosis not present

## 2021-08-04 DIAGNOSIS — N2581 Secondary hyperparathyroidism of renal origin: Secondary | ICD-10-CM | POA: Diagnosis not present

## 2021-08-04 DIAGNOSIS — F29 Unspecified psychosis not due to a substance or known physiological condition: Secondary | ICD-10-CM | POA: Diagnosis not present

## 2021-08-04 DIAGNOSIS — F319 Bipolar disorder, unspecified: Secondary | ICD-10-CM | POA: Diagnosis not present

## 2021-08-04 DIAGNOSIS — F71 Moderate intellectual disabilities: Secondary | ICD-10-CM | POA: Diagnosis not present

## 2021-08-04 DIAGNOSIS — F250 Schizoaffective disorder, bipolar type: Secondary | ICD-10-CM | POA: Diagnosis not present

## 2021-08-05 DIAGNOSIS — D631 Anemia in chronic kidney disease: Secondary | ICD-10-CM | POA: Diagnosis not present

## 2021-08-05 DIAGNOSIS — N2581 Secondary hyperparathyroidism of renal origin: Secondary | ICD-10-CM | POA: Diagnosis not present

## 2021-08-05 DIAGNOSIS — N186 End stage renal disease: Secondary | ICD-10-CM | POA: Diagnosis not present

## 2021-08-06 DIAGNOSIS — N2581 Secondary hyperparathyroidism of renal origin: Secondary | ICD-10-CM | POA: Diagnosis not present

## 2021-08-06 DIAGNOSIS — D631 Anemia in chronic kidney disease: Secondary | ICD-10-CM | POA: Diagnosis not present

## 2021-08-06 DIAGNOSIS — N186 End stage renal disease: Secondary | ICD-10-CM | POA: Diagnosis not present

## 2021-08-07 DIAGNOSIS — N186 End stage renal disease: Secondary | ICD-10-CM | POA: Diagnosis not present

## 2021-08-07 DIAGNOSIS — N2581 Secondary hyperparathyroidism of renal origin: Secondary | ICD-10-CM | POA: Diagnosis not present

## 2021-08-07 DIAGNOSIS — D631 Anemia in chronic kidney disease: Secondary | ICD-10-CM | POA: Diagnosis not present

## 2021-08-08 DIAGNOSIS — D631 Anemia in chronic kidney disease: Secondary | ICD-10-CM | POA: Diagnosis not present

## 2021-08-08 DIAGNOSIS — N2581 Secondary hyperparathyroidism of renal origin: Secondary | ICD-10-CM | POA: Diagnosis not present

## 2021-08-08 DIAGNOSIS — N186 End stage renal disease: Secondary | ICD-10-CM | POA: Diagnosis not present

## 2021-08-09 DIAGNOSIS — N186 End stage renal disease: Secondary | ICD-10-CM | POA: Diagnosis not present

## 2021-08-09 DIAGNOSIS — N2581 Secondary hyperparathyroidism of renal origin: Secondary | ICD-10-CM | POA: Diagnosis not present

## 2021-08-09 DIAGNOSIS — D631 Anemia in chronic kidney disease: Secondary | ICD-10-CM | POA: Diagnosis not present

## 2021-08-10 DIAGNOSIS — D631 Anemia in chronic kidney disease: Secondary | ICD-10-CM | POA: Diagnosis not present

## 2021-08-10 DIAGNOSIS — N2581 Secondary hyperparathyroidism of renal origin: Secondary | ICD-10-CM | POA: Diagnosis not present

## 2021-08-10 DIAGNOSIS — N186 End stage renal disease: Secondary | ICD-10-CM | POA: Diagnosis not present

## 2021-08-11 DIAGNOSIS — N2581 Secondary hyperparathyroidism of renal origin: Secondary | ICD-10-CM | POA: Diagnosis not present

## 2021-08-11 DIAGNOSIS — D631 Anemia in chronic kidney disease: Secondary | ICD-10-CM | POA: Diagnosis not present

## 2021-08-11 DIAGNOSIS — N186 End stage renal disease: Secondary | ICD-10-CM | POA: Diagnosis not present

## 2021-08-12 DIAGNOSIS — N2581 Secondary hyperparathyroidism of renal origin: Secondary | ICD-10-CM | POA: Diagnosis not present

## 2021-08-12 DIAGNOSIS — N186 End stage renal disease: Secondary | ICD-10-CM | POA: Diagnosis not present

## 2021-08-12 DIAGNOSIS — D631 Anemia in chronic kidney disease: Secondary | ICD-10-CM | POA: Diagnosis not present

## 2021-08-13 DIAGNOSIS — N2581 Secondary hyperparathyroidism of renal origin: Secondary | ICD-10-CM | POA: Diagnosis not present

## 2021-08-13 DIAGNOSIS — N186 End stage renal disease: Secondary | ICD-10-CM | POA: Diagnosis not present

## 2021-08-13 DIAGNOSIS — D631 Anemia in chronic kidney disease: Secondary | ICD-10-CM | POA: Diagnosis not present

## 2021-08-14 DIAGNOSIS — N2581 Secondary hyperparathyroidism of renal origin: Secondary | ICD-10-CM | POA: Diagnosis not present

## 2021-08-14 DIAGNOSIS — N186 End stage renal disease: Secondary | ICD-10-CM | POA: Diagnosis not present

## 2021-08-14 DIAGNOSIS — D631 Anemia in chronic kidney disease: Secondary | ICD-10-CM | POA: Diagnosis not present

## 2021-08-15 DIAGNOSIS — N2581 Secondary hyperparathyroidism of renal origin: Secondary | ICD-10-CM | POA: Diagnosis not present

## 2021-08-15 DIAGNOSIS — N186 End stage renal disease: Secondary | ICD-10-CM | POA: Diagnosis not present

## 2021-08-15 DIAGNOSIS — D631 Anemia in chronic kidney disease: Secondary | ICD-10-CM | POA: Diagnosis not present

## 2021-08-16 DIAGNOSIS — D631 Anemia in chronic kidney disease: Secondary | ICD-10-CM | POA: Diagnosis not present

## 2021-08-16 DIAGNOSIS — N186 End stage renal disease: Secondary | ICD-10-CM | POA: Diagnosis not present

## 2021-08-16 DIAGNOSIS — N2581 Secondary hyperparathyroidism of renal origin: Secondary | ICD-10-CM | POA: Diagnosis not present

## 2021-08-17 DIAGNOSIS — N2581 Secondary hyperparathyroidism of renal origin: Secondary | ICD-10-CM | POA: Diagnosis not present

## 2021-08-17 DIAGNOSIS — N186 End stage renal disease: Secondary | ICD-10-CM | POA: Diagnosis not present

## 2021-08-17 DIAGNOSIS — D631 Anemia in chronic kidney disease: Secondary | ICD-10-CM | POA: Diagnosis not present

## 2021-08-18 DIAGNOSIS — D631 Anemia in chronic kidney disease: Secondary | ICD-10-CM | POA: Diagnosis not present

## 2021-08-18 DIAGNOSIS — N2581 Secondary hyperparathyroidism of renal origin: Secondary | ICD-10-CM | POA: Diagnosis not present

## 2021-08-18 DIAGNOSIS — N186 End stage renal disease: Secondary | ICD-10-CM | POA: Diagnosis not present

## 2021-08-19 DIAGNOSIS — D631 Anemia in chronic kidney disease: Secondary | ICD-10-CM | POA: Diagnosis not present

## 2021-08-19 DIAGNOSIS — N186 End stage renal disease: Secondary | ICD-10-CM | POA: Diagnosis not present

## 2021-08-19 DIAGNOSIS — N2581 Secondary hyperparathyroidism of renal origin: Secondary | ICD-10-CM | POA: Diagnosis not present

## 2021-08-20 DIAGNOSIS — N2581 Secondary hyperparathyroidism of renal origin: Secondary | ICD-10-CM | POA: Diagnosis not present

## 2021-08-20 DIAGNOSIS — D631 Anemia in chronic kidney disease: Secondary | ICD-10-CM | POA: Diagnosis not present

## 2021-08-20 DIAGNOSIS — N186 End stage renal disease: Secondary | ICD-10-CM | POA: Diagnosis not present

## 2021-08-21 DIAGNOSIS — N186 End stage renal disease: Secondary | ICD-10-CM | POA: Diagnosis not present

## 2021-08-21 DIAGNOSIS — N2581 Secondary hyperparathyroidism of renal origin: Secondary | ICD-10-CM | POA: Diagnosis not present

## 2021-08-21 DIAGNOSIS — D631 Anemia in chronic kidney disease: Secondary | ICD-10-CM | POA: Diagnosis not present

## 2021-08-22 DIAGNOSIS — N2581 Secondary hyperparathyroidism of renal origin: Secondary | ICD-10-CM | POA: Diagnosis not present

## 2021-08-22 DIAGNOSIS — D631 Anemia in chronic kidney disease: Secondary | ICD-10-CM | POA: Diagnosis not present

## 2021-08-22 DIAGNOSIS — N186 End stage renal disease: Secondary | ICD-10-CM | POA: Diagnosis not present

## 2021-08-22 DIAGNOSIS — Z992 Dependence on renal dialysis: Secondary | ICD-10-CM | POA: Diagnosis not present

## 2021-08-23 DIAGNOSIS — D631 Anemia in chronic kidney disease: Secondary | ICD-10-CM | POA: Diagnosis not present

## 2021-08-23 DIAGNOSIS — N186 End stage renal disease: Secondary | ICD-10-CM | POA: Diagnosis not present

## 2021-08-23 DIAGNOSIS — N2581 Secondary hyperparathyroidism of renal origin: Secondary | ICD-10-CM | POA: Diagnosis not present

## 2021-08-24 DIAGNOSIS — D631 Anemia in chronic kidney disease: Secondary | ICD-10-CM | POA: Diagnosis not present

## 2021-08-24 DIAGNOSIS — N186 End stage renal disease: Secondary | ICD-10-CM | POA: Diagnosis not present

## 2021-08-24 DIAGNOSIS — N2581 Secondary hyperparathyroidism of renal origin: Secondary | ICD-10-CM | POA: Diagnosis not present

## 2021-08-25 DIAGNOSIS — D631 Anemia in chronic kidney disease: Secondary | ICD-10-CM | POA: Diagnosis not present

## 2021-08-25 DIAGNOSIS — N186 End stage renal disease: Secondary | ICD-10-CM | POA: Diagnosis not present

## 2021-08-25 DIAGNOSIS — N2581 Secondary hyperparathyroidism of renal origin: Secondary | ICD-10-CM | POA: Diagnosis not present

## 2021-08-26 ENCOUNTER — Other Ambulatory Visit: Payer: Self-pay

## 2021-08-26 ENCOUNTER — Encounter (HOSPITAL_COMMUNITY): Payer: Self-pay

## 2021-08-26 ENCOUNTER — Inpatient Hospital Stay (HOSPITAL_COMMUNITY)
Admission: EM | Admit: 2021-08-26 | Discharge: 2021-09-11 | DRG: 919 | Disposition: A | Discharge status: Hospice | Payer: Medicare Other | Attending: Family Medicine | Admitting: Family Medicine

## 2021-08-26 DIAGNOSIS — N250 Renal osteodystrophy: Secondary | ICD-10-CM | POA: Diagnosis not present

## 2021-08-26 DIAGNOSIS — L89152 Pressure ulcer of sacral region, stage 2: Secondary | ICD-10-CM | POA: Diagnosis present

## 2021-08-26 DIAGNOSIS — I959 Hypotension, unspecified: Secondary | ICD-10-CM

## 2021-08-26 DIAGNOSIS — E872 Acidosis, unspecified: Secondary | ICD-10-CM | POA: Diagnosis present

## 2021-08-26 DIAGNOSIS — I132 Hypertensive heart and chronic kidney disease with heart failure and with stage 5 chronic kidney disease, or end stage renal disease: Secondary | ICD-10-CM | POA: Diagnosis present

## 2021-08-26 DIAGNOSIS — K219 Gastro-esophageal reflux disease without esophagitis: Secondary | ICD-10-CM | POA: Diagnosis present

## 2021-08-26 DIAGNOSIS — N186 End stage renal disease: Secondary | ICD-10-CM

## 2021-08-26 DIAGNOSIS — Z20822 Contact with and (suspected) exposure to covid-19: Secondary | ICD-10-CM | POA: Diagnosis not present

## 2021-08-26 DIAGNOSIS — F79 Unspecified intellectual disabilities: Secondary | ICD-10-CM | POA: Diagnosis present

## 2021-08-26 DIAGNOSIS — D62 Acute posthemorrhagic anemia: Secondary | ICD-10-CM | POA: Diagnosis present

## 2021-08-26 DIAGNOSIS — Z4659 Encounter for fitting and adjustment of other gastrointestinal appliance and device: Secondary | ICD-10-CM

## 2021-08-26 DIAGNOSIS — N281 Cyst of kidney, acquired: Secondary | ICD-10-CM | POA: Diagnosis not present

## 2021-08-26 DIAGNOSIS — E785 Hyperlipidemia, unspecified: Secondary | ICD-10-CM | POA: Diagnosis present

## 2021-08-26 DIAGNOSIS — N185 Chronic kidney disease, stage 5: Secondary | ICD-10-CM | POA: Diagnosis present

## 2021-08-26 DIAGNOSIS — K652 Spontaneous bacterial peritonitis: Secondary | ICD-10-CM | POA: Diagnosis present

## 2021-08-26 DIAGNOSIS — R112 Nausea with vomiting, unspecified: Principal | ICD-10-CM

## 2021-08-26 DIAGNOSIS — Z6824 Body mass index (BMI) 24.0-24.9, adult: Secondary | ICD-10-CM

## 2021-08-26 DIAGNOSIS — Z8249 Family history of ischemic heart disease and other diseases of the circulatory system: Secondary | ICD-10-CM

## 2021-08-26 DIAGNOSIS — Z221 Carrier of other intestinal infectious diseases: Secondary | ICD-10-CM

## 2021-08-26 DIAGNOSIS — D696 Thrombocytopenia, unspecified: Secondary | ICD-10-CM | POA: Diagnosis not present

## 2021-08-26 DIAGNOSIS — R197 Diarrhea, unspecified: Secondary | ICD-10-CM | POA: Diagnosis not present

## 2021-08-26 DIAGNOSIS — T8571XA Infection and inflammatory reaction due to peritoneal dialysis catheter, initial encounter: Secondary | ICD-10-CM | POA: Diagnosis not present

## 2021-08-26 DIAGNOSIS — Z452 Encounter for adjustment and management of vascular access device: Secondary | ICD-10-CM

## 2021-08-26 DIAGNOSIS — Z7401 Bed confinement status: Secondary | ICD-10-CM

## 2021-08-26 DIAGNOSIS — K8020 Calculus of gallbladder without cholecystitis without obstruction: Secondary | ICD-10-CM | POA: Diagnosis not present

## 2021-08-26 DIAGNOSIS — Y841 Kidney dialysis as the cause of abnormal reaction of the patient, or of later complication, without mention of misadventure at the time of the procedure: Secondary | ICD-10-CM | POA: Diagnosis present

## 2021-08-26 DIAGNOSIS — E8809 Other disorders of plasma-protein metabolism, not elsewhere classified: Secondary | ICD-10-CM | POA: Diagnosis present

## 2021-08-26 DIAGNOSIS — A411 Sepsis due to other specified staphylococcus: Secondary | ICD-10-CM | POA: Diagnosis present

## 2021-08-26 DIAGNOSIS — J690 Pneumonitis due to inhalation of food and vomit: Secondary | ICD-10-CM | POA: Diagnosis present

## 2021-08-26 DIAGNOSIS — R627 Adult failure to thrive: Secondary | ICD-10-CM

## 2021-08-26 DIAGNOSIS — R680 Hypothermia, not associated with low environmental temperature: Secondary | ICD-10-CM | POA: Diagnosis present

## 2021-08-26 DIAGNOSIS — N2581 Secondary hyperparathyroidism of renal origin: Secondary | ICD-10-CM | POA: Diagnosis present

## 2021-08-26 DIAGNOSIS — E162 Hypoglycemia, unspecified: Secondary | ICD-10-CM | POA: Diagnosis present

## 2021-08-26 DIAGNOSIS — F319 Bipolar disorder, unspecified: Secondary | ICD-10-CM | POA: Diagnosis present

## 2021-08-26 DIAGNOSIS — Z993 Dependence on wheelchair: Secondary | ICD-10-CM

## 2021-08-26 DIAGNOSIS — M898X9 Other specified disorders of bone, unspecified site: Secondary | ICD-10-CM | POA: Diagnosis present

## 2021-08-26 DIAGNOSIS — K6389 Other specified diseases of intestine: Secondary | ICD-10-CM | POA: Diagnosis not present

## 2021-08-26 DIAGNOSIS — G40909 Epilepsy, unspecified, not intractable, without status epilepticus: Secondary | ICD-10-CM

## 2021-08-26 DIAGNOSIS — D631 Anemia in chronic kidney disease: Secondary | ICD-10-CM | POA: Diagnosis present

## 2021-08-26 DIAGNOSIS — E440 Moderate protein-calorie malnutrition: Secondary | ICD-10-CM | POA: Diagnosis present

## 2021-08-26 DIAGNOSIS — B957 Other staphylococcus as the cause of diseases classified elsewhere: Secondary | ICD-10-CM

## 2021-08-26 DIAGNOSIS — R6521 Severe sepsis with septic shock: Secondary | ICD-10-CM | POA: Diagnosis not present

## 2021-08-26 DIAGNOSIS — I5022 Chronic systolic (congestive) heart failure: Secondary | ICD-10-CM | POA: Diagnosis present

## 2021-08-26 DIAGNOSIS — R509 Fever, unspecified: Secondary | ICD-10-CM

## 2021-08-26 DIAGNOSIS — Z79899 Other long term (current) drug therapy: Secondary | ICD-10-CM

## 2021-08-26 DIAGNOSIS — G9341 Metabolic encephalopathy: Secondary | ICD-10-CM | POA: Diagnosis present

## 2021-08-26 DIAGNOSIS — E871 Hypo-osmolality and hyponatremia: Secondary | ICD-10-CM | POA: Diagnosis present

## 2021-08-26 DIAGNOSIS — J189 Pneumonia, unspecified organism: Secondary | ICD-10-CM | POA: Diagnosis present

## 2021-08-26 DIAGNOSIS — Z79890 Hormone replacement therapy (postmenopausal): Secondary | ICD-10-CM

## 2021-08-26 DIAGNOSIS — R188 Other ascites: Secondary | ICD-10-CM | POA: Diagnosis present

## 2021-08-26 DIAGNOSIS — F259 Schizoaffective disorder, unspecified: Secondary | ICD-10-CM | POA: Diagnosis present

## 2021-08-26 DIAGNOSIS — K567 Ileus, unspecified: Secondary | ICD-10-CM

## 2021-08-26 DIAGNOSIS — E039 Hypothyroidism, unspecified: Secondary | ICD-10-CM | POA: Diagnosis present

## 2021-08-26 DIAGNOSIS — E876 Hypokalemia: Secondary | ICD-10-CM | POA: Diagnosis not present

## 2021-08-26 DIAGNOSIS — A419 Sepsis, unspecified organism: Secondary | ICD-10-CM | POA: Diagnosis not present

## 2021-08-26 DIAGNOSIS — E861 Hypovolemia: Secondary | ICD-10-CM | POA: Diagnosis present

## 2021-08-26 DIAGNOSIS — Z888 Allergy status to other drugs, medicaments and biological substances status: Secondary | ICD-10-CM

## 2021-08-26 DIAGNOSIS — Z992 Dependence on renal dialysis: Secondary | ICD-10-CM

## 2021-08-26 DIAGNOSIS — K560 Paralytic ileus: Secondary | ICD-10-CM | POA: Diagnosis present

## 2021-08-26 LAB — CBC WITH DIFFERENTIAL/PLATELET
Abs Immature Granulocytes: 0.13 10*3/uL — ABNORMAL HIGH (ref 0.00–0.07)
Basophils Absolute: 0 10*3/uL (ref 0.0–0.1)
Basophils Relative: 0 %
Eosinophils Absolute: 0 10*3/uL (ref 0.0–0.5)
Eosinophils Relative: 0 %
HCT: 32.6 % — ABNORMAL LOW (ref 36.0–46.0)
Hemoglobin: 10.3 g/dL — ABNORMAL LOW (ref 12.0–15.0)
Immature Granulocytes: 1 %
Lymphocytes Relative: 15 %
Lymphs Abs: 1.6 10*3/uL (ref 0.7–4.0)
MCH: 29 pg (ref 26.0–34.0)
MCHC: 31.6 g/dL (ref 30.0–36.0)
MCV: 91.8 fL (ref 80.0–100.0)
Monocytes Absolute: 0.3 10*3/uL (ref 0.1–1.0)
Monocytes Relative: 3 %
Neutro Abs: 8.1 10*3/uL — ABNORMAL HIGH (ref 1.7–7.7)
Neutrophils Relative %: 81 %
Platelets: 369 10*3/uL (ref 150–400)
RBC: 3.55 MIL/uL — ABNORMAL LOW (ref 3.87–5.11)
RDW: 18.9 % — ABNORMAL HIGH (ref 11.5–15.5)
WBC: 10.1 10*3/uL (ref 4.0–10.5)
nRBC: 0.3 % — ABNORMAL HIGH (ref 0.0–0.2)

## 2021-08-26 LAB — RESP PANEL BY RT-PCR (FLU A&B, COVID) ARPGX2
Influenza A by PCR: NEGATIVE
Influenza B by PCR: NEGATIVE
SARS Coronavirus 2 by RT PCR: NEGATIVE

## 2021-08-26 LAB — COMPREHENSIVE METABOLIC PANEL
ALT: 10 U/L (ref 0–44)
AST: 15 U/L (ref 15–41)
Albumin: <1.5 g/dL — ABNORMAL LOW (ref 3.5–5.0)
Alkaline Phosphatase: 182 U/L — ABNORMAL HIGH (ref 38–126)
Anion gap: 17 — ABNORMAL HIGH (ref 5–15)
BUN: 52 mg/dL — ABNORMAL HIGH (ref 6–20)
CO2: 23 mmol/L (ref 22–32)
Calcium: 8.2 mg/dL — ABNORMAL LOW (ref 8.9–10.3)
Chloride: 91 mmol/L — ABNORMAL LOW (ref 98–111)
Creatinine, Ser: 4.8 mg/dL — ABNORMAL HIGH (ref 0.44–1.00)
GFR, Estimated: 10 mL/min — ABNORMAL LOW (ref >60)
Glucose, Bld: 84 mg/dL (ref 70–99)
Potassium: 4.2 mmol/L (ref 3.5–5.1)
Sodium: 131 mmol/L — ABNORMAL LOW (ref 135–145)
Total Bilirubin: 0.7 mg/dL (ref 0.3–1.2)
Total Protein: 5.3 g/dL — ABNORMAL LOW (ref 6.5–8.1)

## 2021-08-26 LAB — LIPASE, BLOOD: Lipase: 17 U/L (ref 11–51)

## 2021-08-26 MED ORDER — SODIUM CHLORIDE 0.9 % IV BOLUS
1000.0000 mL | Freq: Once | INTRAVENOUS | Status: AC
  Administered 2021-08-26: 1000 mL via INTRAVENOUS

## 2021-08-26 MED ORDER — ONDANSETRON HCL 4 MG/2ML IJ SOLN
4.0000 mg | Freq: Once | INTRAMUSCULAR | Status: AC
  Administered 2021-08-27: 4 mg via INTRAVENOUS
  Filled 2021-08-26: qty 2

## 2021-08-26 NOTE — ED Triage Notes (Signed)
Pt has been having n/v/d for the past week, pt is hypotensive in triage. Pt is a dialysis pt as well

## 2021-08-26 NOTE — ED Notes (Signed)
Attempted manual BP on LA and RA x2 with no reading

## 2021-08-26 NOTE — ED Provider Notes (Signed)
[facility] EMERGENCY DEPARTMENT Provider Note   CSN: [id number] Arrival date & time: 08/26/21  2147     History Chief Complaint  Patient presents with   Emesis    Jane [name] is a 55 y.o. female.   Emesis Associated symptoms: abdominal pain and diarrhea   Associated symptoms: no arthralgias, no chills, no cough, no fever, no headaches and no sore throat    55 year old female with PMH significant for ESRD on QID peritoneal dialysis at home, HTN, HLD, schizophrenia, seizures, and others as below who presents to the ED accompanied by her sister with complaints of vomiting.  Patient has reportedly had 1 week of productive cough, vomiting, and diarrhea.  Endorses many episodes of NBNB emesis and nonbloody, watery diarrhea.  She has complained of nonspecific abdominal pain.  She has not missed any dialysis sessions.  She has not had any altered mental status, fevers, headache, chest pain or shortness of breath, rash, abdominal distention,, or any other concerns.  There have been multiple sick family members in the home.  She has had a COVID-19 vaccination but has not had a flu vaccine this year.  Still makes urine.  Has reportedly had no abdominal surgeries.   Past Medical History:  Diagnosis Date   Abnormal gait    Anemia    Bipolar disorder ([place])    Depression    ESRD on peritoneal dialysis ([place])    GERD (gastroesophageal reflux disease)    Hyperlipemia    Hypertension    Hypothyroidism    Moderately mentally retarded    Renal disease    Schizophrenia ([place])    Seizures ([place])    No recent seizures - no meds    Patient Active Problem List   Diagnosis Date Noted   Pressure injury of skin 01/26/2021   Discitis of lumbosacral region 01/25/2021   Anemia 12/06/2018   Slurred speech 10/07/2017   OSA (obstructive sleep apnea) 07/25/2017   Seizure disorder ([place]) 04/22/2016   End-stage renal disease on peritoneal dialysis ([place])    Partial seizure ([place])     Seizure ([place]) 09/14/2015   Essential hypertension 10/11/2010   FOOT PAIN, RIGHT 10/11/2010   BRONCHITIS, ACUTE WITH MILD BRONCHOSPASM 10/04/2010   ANKLE SPRAIN, RIGHT 02/20/2010   WEIGHT GAIN 06/17/2009   Dermatophytosis of foot 04/16/2009   VAGINAL DISCHARGE 04/16/2009   INSOMNIA 04/16/2009   POLYDIPSIA 04/16/2009   FREQUENCY, URINARY 04/16/2009   ANEMIA, IRON DEFICIENCY 12/03/2007   DYSLIPIDEMIA 06/11/2007   OBESITY NOS 06/11/2007   Schizoaffective disorder ([place]) 06/11/2007   DISORDER, BIPOLAR NOS 06/11/2007   RETARDATION, MENTAL NOS 06/11/2007   RENAL INSUFFICIENCY, CHRONIC 06/11/2007   ACNE NEC 06/11/2007   URINARY INCONTINENCE, URGE 06/11/2007   REDUCTION MAMMOPLASTY, HX OF 06/11/2007    Past Surgical History:  Procedure Laterality Date   BREAST REDUCTION SURGERY     BREAST SURGERY     breast reduction   EXAMINATION UNDER ANESTHESIA N/A 12/08/2013   Procedure: EXAM UNDER ANESTHESIA;  Surgeon: Lavonia Drafts, MD;  Location: [facility];  Service: Gynecology;  Laterality: N/A;  Pelvic exam and pap smear, unable to tolerate during last office visit   FOOT SURGERY     right   IR FLUORO GUIDE CV LINE RIGHT  02/01/2021   IR REMOVAL TUN CV CATH W/O FL  03/15/2021   IR US GUIDE VASC ACCESS RIGHT  02/01/2021   LUMBAR LAMINECTOMY/DECOMPRESSION MICRODISCECTOMY N/A 01/25/2021   Procedure: Lumbar Five -Sacral One Laminectomy for Epidural Abscess;  Surgeon: Earnie Larsson, MD;  Location: [facility];  Service: Neurosurgery;  Laterality: N/A;   REDUCTION MAMMAPLASTY Bilateral      OB History     Gravida  0   Para  0   Term  0   Preterm  0   AB  0   Living  0      SAB  0   IAB  0   Ectopic  0   Multiple  0   Live Births              Family History  Problem Relation Age of Onset   Hypertension Other    Breast cancer Mother 44    Social History   Tobacco Use   Smoking status: Never   Smokeless tobacco: Never  Substance Use Topics   Alcohol use: No   Drug  use: No    Home Medications Prior to Admission medications   Medication Sig Start Date End Date Taking? Authorizing Provider  albuterol (PROVENTIL HFA;VENTOLIN HFA) 108 (90 Base) MCG/ACT inhaler Inhale 2 puffs into the lungs every 6 (six) hours as needed for wheezing or shortness of breath.    [provider]  B Complex-C-Folic Acid (DIALYVITE 643) 0.8 MG TABS Take 0.8 mg by mouth daily.     [provider]  benztropine (COGENTIN) 1 MG tablet Take 1 mg by mouth at bedtime.    [provider]  cephALEXin (KEFLEX) 500 MG capsule Take 1 capsule (500 mg total) by mouth 4 (four) times daily. 03/11/21   Thayer Headings, MD  cinacalcet (SENSIPAR) 30 MG tablet Take 30-60 mg by mouth See admin instructions. Take 1 tablet (30 mg) by mouth with breakfast and 2 tablets (60 mg) with supper    [provider]  diclofenac Sodium (VOLTAREN) 1 % GEL Apply 2-4 g topically 4 (four) times daily as needed. 12/27/20   [provider]  divalproex (DEPAKOTE) 250 MG DR tablet Take 3 tablets (750 mg total) by mouth 2 (two) times daily. 02/04/21 03/06/21  Mariel Aloe, MD  Docusate Sodium (DSS) 100 MG CAPS Take 100 mg by mouth daily as needed (constipation).    [provider]  feeding supplement (ENSURE ENLIVE / ENSURE PLUS) LIQD Take 237 mLs by mouth 2 (two) times daily between meals. 02/05/21   Mariel Aloe, MD  FLUoxetine (PROZAC) 20 MG capsule Take 20 mg by mouth daily. 07/15/20   [provider]  HYDROcodone-acetaminophen (NORCO/VICODIN) 5-325 MG tablet Take 1 tablet by mouth 4 (four) times daily as needed. 12/02/20   [provider]  iron polysaccharides (NIFEREX) 150 MG capsule Take 150 mg by mouth every other day.    [provider]  levETIRAcetam (KEPPRA) 500 MG tablet Take 1 tablet (500 mg total) by mouth 2 (two) times daily. 09/17/15   Charlynne Cousins, MD  levothyroxine (SYNTHROID, LEVOTHROID) 50 MCG tablet Take 50 mcg by mouth  daily with breakfast. 10/05/17   [provider]  losartan (COZAAR) 25 MG tablet Take 25 mg by mouth daily. 04/14/20   [provider]  medroxyPROGESTERone (DEPO-PROVERA) 150 MG/ML injection Inject 150 mg into the muscle every 3 (three) months.    [provider]  omeprazole (PRILOSEC) 40 MG capsule Take 40 mg by mouth daily. 04/14/20   [provider]  potassium chloride SA (K-DUR,KLOR-CON) 20 MEQ tablet Take 20 mEq by mouth daily with breakfast.     [provider]  risperiDONE (RISPERDAL) 2 MG  tablet Take 2 mg by mouth at bedtime.    [provider]  sevelamer carbonate (RENVELA) 800 MG tablet Take 800 mg by mouth See admin instructions. Take 1 tablet (800 mg) by mouth with meals and snacks    [provider]  vitamin B-12 (CYANOCOBALAMIN) 500 MCG tablet Take 500 mcg by mouth daily.    [provider]    Allergies    Icodextrin  Review of Systems   Review of Systems  Constitutional:  Negative for activity change, appetite change, chills, diaphoresis and fever.  HENT:  Negative for congestion, ear pain and sore throat.   Eyes:  Negative for photophobia and visual disturbance.  Respiratory:  Negative for cough and shortness of breath.   Cardiovascular:  Negative for chest pain, palpitations and leg swelling.  Gastrointestinal:  Positive for abdominal pain, diarrhea, nausea and vomiting. Negative for abdominal distention, anal bleeding and constipation.  Genitourinary:  Negative for dysuria and hematuria.  Musculoskeletal:  Negative for arthralgias, back pain, neck pain and neck stiffness.  Skin:  Negative for color change and rash.  Neurological:  Negative for dizziness, tremors, seizures, syncope, facial asymmetry, speech difficulty, weakness, light-headedness and headaches.  Hematological:  Does not bruise/bleed easily.  Psychiatric/Behavioral:  Negative for confusion. The patient is not nervous/anxious.   All other  systems reviewed and are negative.  Physical Exam Updated Vital Signs BP 91/70   Pulse 95   Temp 98.7 F (37.1 C) (Oral)   Resp (!) 22   SpO2 99%   Physical Exam Vitals and nursing note reviewed.  Constitutional:      General: She is not in acute distress.    Appearance: She is well-developed. She is not ill-appearing, toxic-appearing or diaphoretic.     Comments: Dry mucous membranes with skin tenting  HENT:     Head: Normocephalic and atraumatic.     Right Ear: External ear normal.     Left Ear: External ear normal.     Nose: Nose normal.     Mouth/Throat:     Mouth: Mucous membranes are dry.     Pharynx: Oropharynx is clear.  Eyes:     Extraocular Movements: Extraocular movements intact.     Conjunctiva/sclera: Conjunctivae normal.     Pupils: Pupils are equal, round, and reactive to light.  Cardiovascular:     Rate and Rhythm: Normal rate and regular rhythm.     Pulses: Normal pulses.     Heart sounds: Normal heart sounds. No murmur heard. Pulmonary:     Effort: Pulmonary effort is normal. No respiratory distress.     Breath sounds: Normal breath sounds and air entry.  Abdominal:     General: Abdomen is flat. There is no distension.     Palpations: Abdomen is soft. There is no hepatomegaly or splenomegaly.     Tenderness: There is generalized abdominal tenderness. There is no guarding or rebound. Negative signs include Murphy's sign, Rovsing's sign and McBurney's sign.     Hernia: No hernia is present.     Comments: Peritoneal dialysis site C/D/I.  Tolerates abdominal palpation, complains of discomfort but does not respond to deep palpation  Musculoskeletal:     Cervical back: Normal range of motion and neck supple. No rigidity or tenderness.     Right lower leg: No edema.     Left lower leg: No edema.  Lymphadenopathy:     Cervical: No cervical adenopathy.  Skin:    General: Skin is warm and dry.  Capillary Refill: Capillary refill takes less than 2 seconds.   Neurological:     General: No focal deficit present.     Mental Status: She is alert. Mental status is at baseline.  Psychiatric:        Mood and Affect: Mood normal.        Behavior: Behavior normal.    ED Results / Procedures / Treatments   Labs (all labs ordered are listed, but only abnormal results are displayed) Labs Reviewed  COMPREHENSIVE METABOLIC PANEL - Abnormal; Notable for the following components:      Result Value   Sodium 131 (*)    Chloride 91 (*)    BUN 52 (*)    Creatinine, Ser 4.80 (*)    Calcium 8.2 (*)    Total Protein 5.3 (*)    Albumin <1.5 (*)    Alkaline Phosphatase 182 (*)    GFR, Estimated 10 (*)    Anion gap 17 (*)    All other components within normal limits  CBC WITH DIFFERENTIAL/PLATELET - Abnormal; Notable for the following components:   RBC 3.55 (*)    Hemoglobin 10.3 (*)    HCT 32.6 (*)    RDW 18.9 (*)    nRBC 0.3 (*)    Neutro Abs 8.1 (*)    Abs Immature Granulocytes 0.13 (*)    All other components within normal limits  LACTIC ACID, PLASMA - Abnormal; Notable for the following components:   Lactic Acid, Venous 3.4 (*)    All other components within normal limits  MAGNESIUM - Abnormal; Notable for the following components:   Magnesium 1.4 (*)    All other components within normal limits  RESP PANEL BY RT-PCR (FLU A&B, COVID) ARPGX2  C DIFFICILE QUICK SCREEN W PCR REFLEX    BODY FLUID CULTURE W GRAM STAIN  GRAM STAIN  CULTURE, BLOOD (ROUTINE X 2)  CULTURE, BLOOD (ROUTINE X 2)  LIPASE, BLOOD  URINALYSIS, ROUTINE W REFLEX MICROSCOPIC  LACTIC ACID, PLASMA  SYNOVIAL CELL COUNT + DIFF, W/ CRYSTALS  GLUCOSE, BODY FLUID OTHER            CBG MONITORING, ED    EKG EKG Interpretation  Date/Time:  Friday August 26 2021 22:49:59 EDT Ventricular Rate:  111 PR Interval:  126 QRS Duration: 90 QT Interval:  348 QTC Calculation: 471 R Axis:   143 Text Interpretation: Sinus tachycardia Right axis deviation Borderline T abnormalities,  inferior leads No significant change since last tracing Confirmed by Isla Pence [phone]) on 08/26/2021 10:52:06 PM  Radiology CT ABDOMEN PELVIS WO CONTRAST  Result Date: 08/27/2021 CLINICAL DATA:  Concern for peritonitis or perforation. EXAM: CT ABDOMEN AND PELVIS WITHOUT CONTRAST TECHNIQUE: Multidetector CT imaging of the abdomen and pelvis was performed following the standard protocol without IV contrast. COMPARISON:  CT abdomen pelvis dated 01/25/2021. FINDINGS: Evaluation of this exam is limited in the absence of intravenous contrast. Lower chest: Diffuse interstitial reticular and nodular densities of the lung bases consistent with pneumonia or aspiration. Clinical correlation is recommended. There is coronary vascular calcification. No intra-abdominal free air.  Small ascites. Hepatobiliary: The liver is unremarkable. No intrahepatic biliary dilatation. There is a stone in the neck of the gallbladder. Pancreas: Unremarkable. No pancreatic ductal dilatation or surrounding inflammatory changes. Spleen: Normal in size without focal abnormality. Adrenals/Urinary Tract: The adrenal glands unremarkable. Moderate bilateral renal parenchyma atrophy and scattered dystrophic calcification versus nonobstructing stones consistent with chronic renal failure. Small bilateral renal cysts suboptimally evaluated. There is  no hydronephrosis on either side. The visualized ureters appear unremarkable. The urinary bladder is minimally distended and grossly unremarkable. Stomach/Bowel: There is no bowel obstruction. Diffuse thickened appearance of the small bowel likely related to ascites. Enteritis is less likely. The appendix is normal as visualized. Vascular/Lymphatic: Mild aortoiliac atherosclerotic disease. The IVC is unremarkable. No portal venous gas. There is no adenopathy. Reproductive: The uterus and ovaries are grossly unremarkable. Other: Diffuse subcutaneous edema. Peritoneal dialysis catheter with tip in the  right lower quadrant. Musculoskeletal: Osteopenia and renal osteodystrophy. Bilateral femoral head avascular necrosis. No acute osseous pathology. IMPRESSION: 1. Bilateral lower lobe pneumonia or aspiration. 2. Small ascites. Peritoneal dialysis catheter with tip in the right lower quadrant. 3. Thickened appearance of the small bowel likely related to ascites. Enteritis is less likely. No bowel obstruction. Normal appendix. 4. Cholelithiasis. 5. Aortic Atherosclerosis (ICD10-I70.0). Electronically Signed   By: Anner Crete M.D.   On: 08/27/2021 01:22   DG Chest Portable 1 View  Result Date: 08/27/2021 CLINICAL DATA:  Sepsis EXAM: PORTABLE CHEST 1 VIEW COMPARISON:  None. FINDINGS: The heart size and mediastinal contours are within normal limits. Both lungs are clear. The visualized skeletal structures are unremarkable. IMPRESSION: No active disease. Electronically Signed   By: Ulyses Jarred M.D.   On: 08/27/2021 00:21    Procedures Procedures   Medications Ordered in ED Medications  sodium chloride 0.9 % bolus 1,000 mL (has no administration in time range)  sodium chloride 0.9 % bolus 1,000 mL (0 mLs Intravenous Stopped 08/27/21 0048)  ondansetron (ZOFRAN) injection 4 mg (4 mg Intravenous Given 08/27/21 0021)    ED Course  I have reviewed the triage vital signs and the nursing notes.  Pertinent labs & imaging results that were available during my care of the patient were reviewed by me and considered in my medical decision making (see chart for details).    MDM Rules/Calculators/A&P                           Cerina JINNIFER MONTEJANO is a 55 y.o. female presenting with vomiting and diarrhea. Initial VS significant for hypotension.  2 ultrasound-guided IVs placed in the bilateral ACs by this provider.   EKG interpretation: Sinus tachycardia, normal intervals, rate 111 bpm.  No ST elevations or depressions, no change from prior EKGs.  Labs: Normocytic anemia near prior baseline, CBC otherwise  unremarkable.  Additional labs pending.  Imaging: CT and CXR pending.   DDX considered: Sepsis, bacteremia, SBP, appendicitis, C. difficile, gastroenteritis, dehydration, COVID-19, influenza. History, examination, and objective data most consistent with hypotension of unclear etiology.  High suspicion for sepsis given report of weeklong GI symptoms, however, hypotension may represent hypovolemia.  Patient is very clinically dry appearing on exam and responsive to fluid resuscitation.  Patient all work-up pending for final diagnosis.  Low suspicion for surgical abdominal emergency, given soft abdomen, however is high risk given peritoneal dialysis.  Medications: Medications  sodium chloride 0.9 % bolus 1,000 mL (has no administration in time range)  sodium chloride 0.9 % bolus 1,000 mL (0 mLs Intravenous Stopped 08/27/21 0048)  ondansetron (ZOFRAN) injection 4 mg (4 mg Intravenous Given 08/27/21 0021)      Re-evaluated after initial interventions.  BP improving after fluids.  Oncoming provider to follow-up on pending labs and imaging.  Anticipate admission for further care, final disposition pending.  Patient understands and agrees with the plan.  Family updated at the bedside.  Transfer of  Care Note I transferred care of Jane [name] at the end of my shift.  I discussed the patient's presentation and critical findings with the oncoming team.  Please see their documentation for the remainder of care.  I answered the incoming team's questions to the best of my ability.  The plan for this patient was discussed with my attending physician, who voiced agreement and who oversaw evaluation and treatment of this patient.     Note: Estate manager/land agent was used in the creation of this note.  Final Clinical Impression(s) / ED Diagnoses Final diagnoses:  Nausea vomiting and diarrhea  Hypotension, unspecified hypotension type    Rx / DC Orders ED Discharge Orders     None         Cherly Hensen, DO 08/27/21 0159    Ezequiel Essex, MD 08/27/21 0302

## 2021-08-26 NOTE — ED Provider Notes (Signed)
Emergency Medicine Provider Triage Evaluation Note  Shonda BANNIE LOBBAN , a 55 y.o. female  was evaluated in triage.  Pt complains of n/v/d x 1 week. Inability to keep anything down. No blood noted in emesis/vomit per patient's family member. Also has been complaining of abdominal pain. Peritoneal dialysis patient.no recent foreign travel or abx.    Review of Systems  Positive: N/V/D, abdominal pain, cough Negative: Hematemesis, hematochezia, chest pain, dyspnea, fever  Physical Exam  BP (!) 76/47 (BP Location: Right Arm)   Pulse 76   Temp 98.7 F (37.1 C) (Oral)   Resp 14   SpO2 96%  Gen:   Awake, no distress   Resp:  Normal effort Other:  Peritoneal dialysis tubing present. No peritoneal signs.   Medical Decision Making  Medically screening exam initiated at 10:22 PM.  Appropriate orders placed.  Hargun ILLIANNA PASCHAL was informed that the remainder of the evaluation will be completed by another provider, this initial triage assessment does not replace that evaluation, and the importance of remaining in the ED until their evaluation is complete.  Hypotensives 70s/40s in triage- prioritize rooming.    Leafy Kindle 08/26/21 2224    Isla Pence, MD 08/26/21 2258

## 2021-08-27 ENCOUNTER — Emergency Department (HOSPITAL_COMMUNITY): Payer: Medicare Other

## 2021-08-27 ENCOUNTER — Other Ambulatory Visit: Payer: Self-pay

## 2021-08-27 ENCOUNTER — Encounter (HOSPITAL_COMMUNITY): Payer: Self-pay | Admitting: Family Medicine

## 2021-08-27 DIAGNOSIS — Z7189 Other specified counseling: Secondary | ICD-10-CM | POA: Diagnosis not present

## 2021-08-27 DIAGNOSIS — J690 Pneumonitis due to inhalation of food and vomit: Secondary | ICD-10-CM | POA: Diagnosis present

## 2021-08-27 DIAGNOSIS — I132 Hypertensive heart and chronic kidney disease with heart failure and with stage 5 chronic kidney disease, or end stage renal disease: Secondary | ICD-10-CM | POA: Diagnosis present

## 2021-08-27 DIAGNOSIS — N250 Renal osteodystrophy: Secondary | ICD-10-CM | POA: Diagnosis not present

## 2021-08-27 DIAGNOSIS — E872 Acidosis, unspecified: Secondary | ICD-10-CM | POA: Diagnosis present

## 2021-08-27 DIAGNOSIS — Z221 Carrier of other intestinal infectious diseases: Secondary | ICD-10-CM | POA: Diagnosis not present

## 2021-08-27 DIAGNOSIS — N281 Cyst of kidney, acquired: Secondary | ICD-10-CM | POA: Diagnosis not present

## 2021-08-27 DIAGNOSIS — K560 Paralytic ileus: Secondary | ICD-10-CM | POA: Diagnosis present

## 2021-08-27 DIAGNOSIS — T8571XS Infection and inflammatory reaction due to peritoneal dialysis catheter, sequela: Secondary | ICD-10-CM | POA: Diagnosis not present

## 2021-08-27 DIAGNOSIS — K8020 Calculus of gallbladder without cholecystitis without obstruction: Secondary | ICD-10-CM | POA: Diagnosis not present

## 2021-08-27 DIAGNOSIS — E871 Hypo-osmolality and hyponatremia: Secondary | ICD-10-CM | POA: Diagnosis present

## 2021-08-27 DIAGNOSIS — R197 Diarrhea, unspecified: Secondary | ICD-10-CM | POA: Diagnosis not present

## 2021-08-27 DIAGNOSIS — G40909 Epilepsy, unspecified, not intractable, without status epilepticus: Secondary | ICD-10-CM

## 2021-08-27 DIAGNOSIS — I959 Hypotension, unspecified: Secondary | ICD-10-CM | POA: Insufficient documentation

## 2021-08-27 DIAGNOSIS — E440 Moderate protein-calorie malnutrition: Secondary | ICD-10-CM | POA: Diagnosis present

## 2021-08-27 DIAGNOSIS — R140 Abdominal distension (gaseous): Secondary | ICD-10-CM | POA: Diagnosis not present

## 2021-08-27 DIAGNOSIS — N2581 Secondary hyperparathyroidism of renal origin: Secondary | ICD-10-CM | POA: Diagnosis present

## 2021-08-27 DIAGNOSIS — K652 Spontaneous bacterial peritonitis: Secondary | ICD-10-CM | POA: Diagnosis present

## 2021-08-27 DIAGNOSIS — R6520 Severe sepsis without septic shock: Secondary | ICD-10-CM | POA: Diagnosis not present

## 2021-08-27 DIAGNOSIS — K6389 Other specified diseases of intestine: Secondary | ICD-10-CM | POA: Diagnosis not present

## 2021-08-27 DIAGNOSIS — N186 End stage renal disease: Secondary | ICD-10-CM | POA: Diagnosis present

## 2021-08-27 DIAGNOSIS — I5022 Chronic systolic (congestive) heart failure: Secondary | ICD-10-CM | POA: Diagnosis present

## 2021-08-27 DIAGNOSIS — R6521 Severe sepsis with septic shock: Secondary | ICD-10-CM

## 2021-08-27 DIAGNOSIS — R112 Nausea with vomiting, unspecified: Secondary | ICD-10-CM

## 2021-08-27 DIAGNOSIS — B957 Other staphylococcus as the cause of diseases classified elsewhere: Secondary | ICD-10-CM | POA: Diagnosis not present

## 2021-08-27 DIAGNOSIS — Z4682 Encounter for fitting and adjustment of non-vascular catheter: Secondary | ICD-10-CM | POA: Diagnosis not present

## 2021-08-27 DIAGNOSIS — F319 Bipolar disorder, unspecified: Secondary | ICD-10-CM | POA: Diagnosis present

## 2021-08-27 DIAGNOSIS — R509 Fever, unspecified: Secondary | ICD-10-CM | POA: Diagnosis not present

## 2021-08-27 DIAGNOSIS — T8571XA Infection and inflammatory reaction due to peritoneal dialysis catheter, initial encounter: Secondary | ICD-10-CM | POA: Diagnosis present

## 2021-08-27 DIAGNOSIS — Z515 Encounter for palliative care: Secondary | ICD-10-CM | POA: Diagnosis not present

## 2021-08-27 DIAGNOSIS — D62 Acute posthemorrhagic anemia: Secondary | ICD-10-CM | POA: Diagnosis present

## 2021-08-27 DIAGNOSIS — N185 Chronic kidney disease, stage 5: Secondary | ICD-10-CM | POA: Diagnosis not present

## 2021-08-27 DIAGNOSIS — A411 Sepsis due to other specified staphylococcus: Secondary | ICD-10-CM | POA: Diagnosis present

## 2021-08-27 DIAGNOSIS — K567 Ileus, unspecified: Secondary | ICD-10-CM | POA: Diagnosis not present

## 2021-08-27 DIAGNOSIS — A419 Sepsis, unspecified organism: Secondary | ICD-10-CM | POA: Diagnosis present

## 2021-08-27 DIAGNOSIS — E876 Hypokalemia: Secondary | ICD-10-CM | POA: Diagnosis not present

## 2021-08-27 DIAGNOSIS — Z20822 Contact with and (suspected) exposure to covid-19: Secondary | ICD-10-CM | POA: Diagnosis present

## 2021-08-27 DIAGNOSIS — R188 Other ascites: Secondary | ICD-10-CM | POA: Diagnosis present

## 2021-08-27 DIAGNOSIS — E039 Hypothyroidism, unspecified: Secondary | ICD-10-CM | POA: Diagnosis present

## 2021-08-27 DIAGNOSIS — R627 Adult failure to thrive: Secondary | ICD-10-CM | POA: Diagnosis not present

## 2021-08-27 DIAGNOSIS — J189 Pneumonia, unspecified organism: Secondary | ICD-10-CM | POA: Diagnosis present

## 2021-08-27 DIAGNOSIS — Y841 Kidney dialysis as the cause of abnormal reaction of the patient, or of later complication, without mention of misadventure at the time of the procedure: Secondary | ICD-10-CM | POA: Diagnosis present

## 2021-08-27 DIAGNOSIS — D631 Anemia in chronic kidney disease: Secondary | ICD-10-CM | POA: Diagnosis present

## 2021-08-27 DIAGNOSIS — T8571XD Infection and inflammatory reaction due to peritoneal dialysis catheter, subsequent encounter: Secondary | ICD-10-CM | POA: Diagnosis not present

## 2021-08-27 DIAGNOSIS — F259 Schizoaffective disorder, unspecified: Secondary | ICD-10-CM | POA: Diagnosis present

## 2021-08-27 DIAGNOSIS — R7881 Bacteremia: Secondary | ICD-10-CM | POA: Diagnosis not present

## 2021-08-27 DIAGNOSIS — Z992 Dependence on renal dialysis: Secondary | ICD-10-CM | POA: Diagnosis not present

## 2021-08-27 DIAGNOSIS — G9341 Metabolic encephalopathy: Secondary | ICD-10-CM | POA: Diagnosis present

## 2021-08-27 LAB — MAGNESIUM: Magnesium: 1.4 mg/dL — ABNORMAL LOW (ref 1.7–2.4)

## 2021-08-27 LAB — TSH: TSH: 2.584 u[IU]/mL (ref 0.350–4.500)

## 2021-08-27 LAB — BODY FLUID CELL COUNT WITH DIFFERENTIAL
Eos, Fluid: 1 %
Lymphs, Fluid: 2 %
Monocyte-Macrophage-Serous Fluid: 13 % — ABNORMAL LOW (ref 50–90)
Neutrophil Count, Fluid: 84 % — ABNORMAL HIGH (ref 0–25)
Total Nucleated Cell Count, Fluid: 1099 cu mm — ABNORMAL HIGH (ref 0–1000)

## 2021-08-27 LAB — COMPREHENSIVE METABOLIC PANEL
ALT: 11 U/L (ref 0–44)
AST: 17 U/L (ref 15–41)
Albumin: <1.5 g/dL — ABNORMAL LOW (ref 3.5–5.0)
Alkaline Phosphatase: 178 U/L — ABNORMAL HIGH (ref 38–126)
Anion gap: 14 (ref 5–15)
BUN: 53 mg/dL — ABNORMAL HIGH (ref 6–20)
CO2: 20 mmol/L — ABNORMAL LOW (ref 22–32)
Calcium: 7.5 mg/dL — ABNORMAL LOW (ref 8.9–10.3)
Chloride: 98 mmol/L (ref 98–111)
Creatinine, Ser: 4.65 mg/dL — ABNORMAL HIGH (ref 0.44–1.00)
GFR, Estimated: 11 mL/min — ABNORMAL LOW (ref >60)
Glucose, Bld: 46 mg/dL — ABNORMAL LOW (ref 70–99)
Potassium: 4.8 mmol/L (ref 3.5–5.1)
Sodium: 132 mmol/L — ABNORMAL LOW (ref 135–145)
Total Bilirubin: 0.5 mg/dL (ref 0.3–1.2)
Total Protein: 4.6 g/dL — ABNORMAL LOW (ref 6.5–8.1)

## 2021-08-27 LAB — CBC
HCT: 26.9 % — ABNORMAL LOW (ref 36.0–46.0)
Hemoglobin: 8.8 g/dL — ABNORMAL LOW (ref 12.0–15.0)
MCH: 30.1 pg (ref 26.0–34.0)
MCHC: 32.7 g/dL (ref 30.0–36.0)
MCV: 92.1 fL (ref 80.0–100.0)
Platelets: 283 10*3/uL (ref 150–400)
RBC: 2.92 MIL/uL — ABNORMAL LOW (ref 3.87–5.11)
RDW: 18.8 % — ABNORMAL HIGH (ref 11.5–15.5)
WBC: 9.5 10*3/uL (ref 4.0–10.5)
nRBC: 0.3 % — ABNORMAL HIGH (ref 0.0–0.2)

## 2021-08-27 LAB — URINALYSIS, ROUTINE W REFLEX MICROSCOPIC
Bacteria, UA: NONE SEEN
Bilirubin Urine: NEGATIVE
Glucose, UA: NEGATIVE mg/dL
Hgb urine dipstick: NEGATIVE
Ketones, ur: NEGATIVE mg/dL
Nitrite: NEGATIVE
Protein, ur: NEGATIVE mg/dL
Specific Gravity, Urine: 1.01 (ref 1.005–1.030)
pH: 5 (ref 5.0–8.0)

## 2021-08-27 LAB — GLUCOSE, CAPILLARY
Glucose-Capillary: 133 mg/dL — ABNORMAL HIGH (ref 70–99)
Glucose-Capillary: 61 mg/dL — ABNORMAL LOW (ref 70–99)
Glucose-Capillary: 63 mg/dL — ABNORMAL LOW (ref 70–99)
Glucose-Capillary: 68 mg/dL — ABNORMAL LOW (ref 70–99)

## 2021-08-27 LAB — LACTIC ACID, PLASMA
Lactic Acid, Venous: 1 mmol/L (ref 0.5–1.9)
Lactic Acid, Venous: 3 mmol/L (ref 0.5–1.9)
Lactic Acid, Venous: 3.4 mmol/L (ref 0.5–1.9)

## 2021-08-27 LAB — PROCALCITONIN: Procalcitonin: 1.62 ng/mL

## 2021-08-27 LAB — CORTISOL: Cortisol, Plasma: 19.8 ug/dL

## 2021-08-27 MED ORDER — LACTATED RINGERS IV BOLUS
1000.0000 mL | Freq: Once | INTRAVENOUS | Status: AC
  Administered 2021-08-27: 1000 mL via INTRAVENOUS

## 2021-08-27 MED ORDER — ALBUTEROL SULFATE (2.5 MG/3ML) 0.083% IN NEBU
2.5000 mg | INHALATION_SOLUTION | Freq: Four times a day (QID) | RESPIRATORY_TRACT | Status: DC | PRN
  Administered 2021-09-04: 2.5 mg via RESPIRATORY_TRACT
  Filled 2021-08-27 (×2): qty 3

## 2021-08-27 MED ORDER — DELFLEX-LC/1.5% DEXTROSE 344 MOSM/L IP SOLN
INTRAPERITONEAL | Status: DC
  Administered 2021-08-27: 5000 mL via INTRAPERITONEAL

## 2021-08-27 MED ORDER — FLUOXETINE HCL 20 MG PO CAPS
20.0000 mg | ORAL_CAPSULE | Freq: Every day | ORAL | Status: DC
  Administered 2021-08-27 – 2021-08-31 (×5): 20 mg via ORAL
  Filled 2021-08-27 (×5): qty 1

## 2021-08-27 MED ORDER — RISPERIDONE 0.5 MG PO TABS
3.0000 mg | ORAL_TABLET | Freq: Every day | ORAL | Status: DC
  Administered 2021-08-27 – 2021-08-30 (×4): 3 mg via ORAL
  Filled 2021-08-27 (×4): qty 6

## 2021-08-27 MED ORDER — RISPERIDONE 3 MG PO TABS: 3.0000 mg | ORAL_TABLET | Freq: Every day | ORAL | Status: DC

## 2021-08-27 MED ORDER — METRONIDAZOLE 500 MG/100ML IV SOLN
500.0000 mg | Freq: Two times a day (BID) | INTRAVENOUS | Status: DC
  Administered 2021-08-27 – 2021-08-29 (×5): 500 mg via INTRAVENOUS
  Filled 2021-08-27 (×5): qty 100

## 2021-08-27 MED ORDER — VANCOMYCIN HCL 1500 MG/300ML IV SOLN
1500.0000 mg | Freq: Once | INTRAVENOUS | Status: AC
  Administered 2021-08-27: 1500 mg via INTRAVENOUS
  Filled 2021-08-27: qty 300

## 2021-08-27 MED ORDER — LEVETIRACETAM 500 MG PO TABS
500.0000 mg | ORAL_TABLET | Freq: Two times a day (BID) | ORAL | Status: DC
  Administered 2021-08-27 – 2021-08-29 (×5): 500 mg via ORAL
  Filled 2021-08-27 (×5): qty 1

## 2021-08-27 MED ORDER — DEXTROSE 50 % IV SOLN
INTRAVENOUS | Status: AC
  Administered 2021-08-27: 12.5 g via INTRAVENOUS
  Filled 2021-08-27: qty 50

## 2021-08-27 MED ORDER — GENTAMICIN SULFATE 0.1 % EX CREA
1.0000 "application " | TOPICAL_CREAM | Freq: Every day | CUTANEOUS | Status: DC
  Administered 2021-08-27 – 2021-09-11 (×14): 1 via TOPICAL
  Filled 2021-08-27 (×2): qty 15

## 2021-08-27 MED ORDER — ACETAMINOPHEN 325 MG PO TABS: 650.0000 mg | ORAL_TABLET | Freq: Four times a day (QID) | ORAL | Status: DC | PRN

## 2021-08-27 MED ORDER — SODIUM CHLORIDE 0.9 % IV SOLN
2.0000 g | INTRAVENOUS | Status: DC
  Administered 2021-08-28: 2 g via INTRAVENOUS
  Filled 2021-08-27: qty 2

## 2021-08-27 MED ORDER — SODIUM CHLORIDE 0.9 % IV SOLN
2.0000 g | Freq: Once | INTRAVENOUS | Status: AC
  Administered 2021-08-27: 2 g via INTRAVENOUS
  Filled 2021-08-27: qty 2

## 2021-08-27 MED ORDER — LEVOTHYROXINE SODIUM 50 MCG PO TABS
50.0000 ug | ORAL_TABLET | Freq: Every day | ORAL | Status: DC
  Administered 2021-08-27 – 2021-08-31 (×5): 50 ug via ORAL
  Filled 2021-08-27 (×4): qty 1
  Filled 2021-08-27: qty 2

## 2021-08-27 MED ORDER — CHLORHEXIDINE GLUCONATE CLOTH 2 % EX PADS
6.0000 | MEDICATED_PAD | Freq: Every day | CUTANEOUS | Status: DC
  Administered 2021-08-27 – 2021-09-11 (×16): 6 via TOPICAL

## 2021-08-27 MED ORDER — ALBUMIN HUMAN 25 % IV SOLN
25.0000 g | Freq: Four times a day (QID) | INTRAVENOUS | Status: AC
  Administered 2021-08-27 – 2021-08-28 (×4): 25 g via INTRAVENOUS
  Filled 2021-08-27 (×4): qty 100

## 2021-08-27 MED ORDER — MIDODRINE HCL 5 MG PO TABS
5.0000 mg | ORAL_TABLET | Freq: Three times a day (TID) | ORAL | Status: DC
  Administered 2021-08-27 – 2021-08-31 (×13): 5 mg via ORAL
  Filled 2021-08-27 (×13): qty 1

## 2021-08-27 MED ORDER — DEXTROSE 50 % IV SOLN
INTRAVENOUS | Status: AC
  Administered 2021-08-27: 25 mL
  Filled 2021-08-27: qty 50

## 2021-08-27 MED ORDER — MAGNESIUM SULFATE IN D5W 1-5 GM/100ML-% IV SOLN
1.0000 g | Freq: Once | INTRAVENOUS | Status: AC
  Administered 2021-08-27: 1 g via INTRAVENOUS
  Filled 2021-08-27: qty 100

## 2021-08-27 MED ORDER — BENZTROPINE MESYLATE 1 MG PO TABS
1.0000 mg | ORAL_TABLET | Freq: Every day | ORAL | Status: DC
  Administered 2021-08-27 – 2021-08-30 (×4): 1 mg via ORAL
  Filled 2021-08-27 (×5): qty 1

## 2021-08-27 MED ORDER — ACETAMINOPHEN 650 MG RE SUPP
650.0000 mg | Freq: Four times a day (QID) | RECTAL | Status: DC | PRN
  Administered 2021-09-08: 11:00:00 650 mg via RECTAL
  Filled 2021-08-27: qty 1

## 2021-08-27 MED ORDER — HYDROCODONE-ACETAMINOPHEN 5-325 MG PO TABS
1.0000 | ORAL_TABLET | Freq: Four times a day (QID) | ORAL | Status: DC | PRN
  Administered 2021-09-03 – 2021-09-07 (×5): 1 via ORAL
  Filled 2021-08-27 (×5): qty 1

## 2021-08-27 MED ORDER — HEPARIN 1000 UNIT/ML FOR PERITONEAL DIALYSIS
INTRAPERITONEAL | Status: DC | PRN
  Filled 2021-08-27: qty 5000

## 2021-08-27 MED ORDER — DEXTROSE 50 % IV SOLN: 12.5000 g | Freq: Once | INTRAVENOUS | Status: AC

## 2021-08-27 MED ORDER — SODIUM CHLORIDE 0.9 % IV BOLUS
500.0000 mL | Freq: Once | INTRAVENOUS | Status: AC
  Administered 2021-08-27: 500 mL via INTRAVENOUS

## 2021-08-27 MED ORDER — HEPARIN SODIUM (PORCINE) 5000 UNIT/ML IJ SOLN
5000.0000 [IU] | Freq: Three times a day (TID) | INTRAMUSCULAR | Status: DC
  Administered 2021-08-27 – 2021-08-28 (×3): 5000 [IU] via SUBCUTANEOUS
  Filled 2021-08-27 (×3): qty 1

## 2021-08-27 MED ORDER — HEPARIN 1000 UNIT/ML FOR PERITONEAL DIALYSIS
500.0000 [IU] | INTRAMUSCULAR | Status: DC | PRN
  Filled 2021-08-27: qty 0.5

## 2021-08-27 MED ORDER — ALBUMIN HUMAN 25 % IV SOLN
50.0000 g | Freq: Once | INTRAVENOUS | Status: AC
  Administered 2021-08-27: 50 g via INTRAVENOUS
  Filled 2021-08-27: qty 200

## 2021-08-27 MED ORDER — ONDANSETRON HCL 4 MG/2ML IJ SOLN
4.0000 mg | Freq: Four times a day (QID) | INTRAMUSCULAR | Status: DC | PRN
  Administered 2021-08-27 – 2021-09-11 (×7): 4 mg via INTRAVENOUS
  Filled 2021-08-27 (×7): qty 2

## 2021-08-27 MED ORDER — MAGNESIUM SULFATE 2 GM/50ML IV SOLN
2.0000 g | Freq: Once | INTRAVENOUS | Status: AC
  Administered 2021-08-27: 2 g via INTRAVENOUS
  Filled 2021-08-27: qty 50

## 2021-08-27 MED ORDER — ONDANSETRON HCL 4 MG PO TABS: 4.0000 mg | ORAL_TABLET | Freq: Four times a day (QID) | ORAL | Status: DC | PRN

## 2021-08-27 MED ORDER — NOREPINEPHRINE 4 MG/250ML-% IV SOLN: 2.0000 ug/min | INTRAVENOUS | Status: DC

## 2021-08-27 MED ORDER — SODIUM CHLORIDE 0.9 % IV SOLN
250.0000 mL | INTRAVENOUS | Status: DC
  Administered 2021-08-27: 250 mL via INTRAVENOUS

## 2021-08-27 MED ORDER — DIVALPROEX SODIUM 500 MG PO DR TAB
500.0000 mg | DELAYED_RELEASE_TABLET | Freq: Two times a day (BID) | ORAL | Status: DC
  Administered 2021-08-27 – 2021-08-31 (×9): 500 mg via ORAL
  Filled 2021-08-27 (×10): qty 1

## 2021-08-27 MED ORDER — SODIUM CHLORIDE 0.9% FLUSH
3.0000 mL | Freq: Two times a day (BID) | INTRAVENOUS | Status: DC
  Administered 2021-08-27 – 2021-09-11 (×22): 3 mL via INTRAVENOUS

## 2021-08-27 MED ORDER — SODIUM CHLORIDE 0.9 % IV BOLUS
1000.0000 mL | Freq: Once | INTRAVENOUS | Status: AC
  Administered 2021-08-27: 1000 mL via INTRAVENOUS

## 2021-08-27 NOTE — ED Notes (Signed)
Repeat rectal temp 99.9, removed bear hugger.

## 2021-08-27 NOTE — ED Notes (Signed)
Pt BP 76/53 MAP 60 discussed with admitting provider Dr Myna Hidalgo. MD at bedside.

## 2021-08-27 NOTE — ED Notes (Signed)
Call placed to ICU for verbal report, RN will return the call.

## 2021-08-27 NOTE — Consult Note (Signed)
NAME:  Jane [name], MRN:  [id number], DOB:  Feb 25, 1966, LOS: 0 ADMISSION DATE:  08/26/2021, CONSULTATION DATE:  08/27/21 REFERRING MD:  Aileen Fass, CHIEF COMPLAINT:  N/V   History of Present Illness:  55 year old woman with hx of ESRD on PD, schizoaffective disorder, prior discitis/osteomyelitis/epidural abscess earlier this year presenting with N/V/D, abd pain, and cough.  Workup in ER c/w septic shock, possible aspiration PNA, shock not responding to fluids, and persistent lactic acidemia for which PCCM is consulted.  Family at bedside.    Pertinent  Medical History  ESRD on PD followed by [facility] Cognitive impairment/ mental retardation Depression/Bipolar/schizophrenia/seizures (no AEDs) HTN HLD Hypothyroidism  Significant Hospital Events: Including procedures, antibiotic start and stop dates in addition to other pertinent events   11/5 admitted  Interim History / Subjective:  consulted  Objective   Blood pressure (!) 84/61, pulse (!) 123, temperature 98.3 F (36.8 C), temperature source Oral, resp. rate (!) 31, SpO2 93 %.        Intake/Output Summary (Last 24 hours) at 08/27/2021 0837 Last data filed at 08/27/2021 0048 Gross per 24 hour  Intake 1000 ml  Output --  Net 1000 ml   There were no vitals filed for this visit.  Examination: General: Mildly tachypneic, chronically ill appearing HENT: MM dry Lungs: Diminished bases, shallow inspiratory effort Cardiovascular: Tachycardic, regular, ext warm Abdomen: Soft but diffusely TTP, PD catheter in place Skin/Extremities: 1+ edema, no rashes Neuro: moves all 4 ext to command Psych: poor insight  Resolved Hospital Problem list   N/A  Assessment & Plan:  Septic shock, sources are some combination colitis, aspiration pneumonitis, peritonitis ESRD on PD Multiple cognitive and psychiatric comorbidities - Transfer to ICU - C diff test - Peritoneal, blood, sputum cultures - Vanc/cefepime/flagyl - Resume home  psych meds - Give another liter of fluid, albumin 100g 25% over 24h - Start midodrine 5mg  TID - Peripheral levophed titrated to MAP 65, central line may be needed but will see how she does through day  Best Practice (right click and "Reselect all SmartList Selections" daily)   Diet/type: Regular consistency (see orders) DVT prophylaxis: prophylactic heparin  GI prophylaxis: N/A Lines: N/A Foley:  N/A Code Status:  full code, confirmed with family Last date of multidisciplinary goals of care discussion [08/27/21 at bedside]  Labs   CBC: Recent Labs  Lab 08/26/21 2229 08/27/21 0735  WBC 10.1 9.5  NEUTROABS 8.1*  --   HGB 10.3* 8.8*  HCT 32.6* 26.9*  MCV 91.8 92.1  PLT 369 458    Basic Metabolic Panel: Recent Labs  Lab 08/26/21 2229 08/26/21 2348  NA 131*  --   K 4.2  --   CL 91*  --   CO2 23  --   GLUCOSE 84  --   BUN 52*  --   CREATININE 4.80*  --   CALCIUM 8.2*  --   MG  --  1.4*   GFR: CrCl cannot be calculated (Unknown ideal weight.). Recent Labs  Lab 08/26/21 2229 08/26/21 2348 08/27/21 0121 08/27/21 0735  WBC 10.1  --   --  9.5  LATICACIDVEN  --  3.4* 3.0*  --     Liver Function Tests: Recent Labs  Lab 08/26/21 2229  AST 15  ALT 10  ALKPHOS 182*  BILITOT 0.7  PROT 5.3*  ALBUMIN <1.5*   Recent Labs  Lab 08/26/21 2229  LIPASE 17   No results for input(s): AMMONIA in the last  168 hours.  ABG    Component Value Date/Time   PHART 7.473 (H) 01/25/2021 0830   PCO2ART 36.1 01/25/2021 0830   PO2ART 276 (H) 01/25/2021 0830   HCO3 26.7 01/25/2021 0830   TCO2 28 01/25/2021 0830   O2SAT 100.0 01/25/2021 0830     Coagulation Profile: No results for input(s): INR, PROTIME in the last 168 hours.  Cardiac Enzymes: No results for input(s): CKTOTAL, CKMB, CKMBINDEX, TROPONINI in the last 168 hours.  HbA1C: Hgb A1c MFr Bld  Date/Time Value Ref Range Status  01/25/2021 05:45 PM 5.6 4.8 - 5.6 % Final    Comment:    (NOTE) Pre diabetes:           5.7%-6.4%  Diabetes:              >6.4%  Glycemic control for   <7.0% adults with diabetes   10/08/2017 12:29 AM 4.7 (L) 4.8 - 5.6 % Final    Comment:    (NOTE)         Prediabetes: 5.7 - 6.4         Diabetes: >6.4         Glycemic control for adults with diabetes: <7.0     CBG: No results for input(s): GLUCAP in the last 168 hours.  Review of Systems:    Positive Symptoms in bold:  Constitutional fevers, chills, weight loss, fatigue, anorexia, malaise  Eyes decreased vision, double vision, eye irritation  Ears, Nose, Mouth, Throat sore throat, trouble swallowing, sinus congestion  Cardiovascular chest pain, paroxysmal nocturnal dyspnea, lower ext edema, palpitations   Respiratory SOB, cough, DOE, hemoptysis, wheezing  Gastrointestinal nausea, vomiting, diarrhea  Genitourinary burning with urination, trouble urinating  Musculoskeletal joint aches, joint swelling, back pain  Integumentary  rashes, skin lesions  Neurological focal weakness, focal numbness, trouble speaking, headaches  Psychiatric depression, anxiety, confusion  Endocrine polyuria, polydipsia, cold intolerance, heat intolerance  Hematologic abnormal bruising, abnormal bleeding, unexplained nose bleeds  Allergic/Immunologic recurrent infections, hives, swollen lymph nodes     Past Medical History:  She,  has a past medical history of Abnormal gait, Anemia, Bipolar disorder ([place]), Depression, ESRD on peritoneal dialysis ([place]), GERD (gastroesophageal reflux disease), Hyperlipemia, Hypertension, Hypothyroidism, Moderately mentally retarded, Renal disease, Schizophrenia ([place]), and Seizures ([place]).   Surgical History:   Past Surgical History:  Procedure Laterality Date   BREAST REDUCTION SURGERY     BREAST SURGERY     breast reduction   EXAMINATION UNDER ANESTHESIA N/A 12/08/2013   Procedure: EXAM UNDER ANESTHESIA;  Surgeon: Lavonia Drafts, MD;  Location: [facility];  Service: Gynecology;   Laterality: N/A;  Pelvic exam and pap smear, unable to tolerate during last office visit   FOOT SURGERY     right   IR FLUORO GUIDE CV LINE RIGHT  02/01/2021   IR REMOVAL TUN CV CATH W/O FL  03/15/2021   IR US GUIDE VASC ACCESS RIGHT  02/01/2021   LUMBAR LAMINECTOMY/DECOMPRESSION MICRODISCECTOMY N/A 01/25/2021   Procedure: Lumbar Five -Sacral One Laminectomy for Epidural Abscess;  Surgeon: Earnie Larsson, MD;  Location: [facility];  Service: Neurosurgery;  Laterality: N/A;   REDUCTION MAMMAPLASTY Bilateral      Social History:   reports that she has never smoked. She has never used smokeless tobacco. She reports that she does not drink alcohol and does not use drugs.   Family History:  Her family history includes Breast cancer (age of onset: 71) in her mother; Hypertension in an other family member.  Allergies Allergies  Allergen Reactions   Icodextrin Rash     Home Medications  Prior to Admission medications   Medication Sig Start Date End Date Taking? Authorizing Provider  albuterol (PROVENTIL HFA;VENTOLIN HFA) 108 (90 Base) MCG/ACT inhaler Inhale 2 puffs into the lungs every 6 (six) hours as needed for wheezing or shortness of breath.   Yes [provider]  B Complex-C-Folic Acid (DIALYVITE 629) 0.8 MG TABS Take 0.8 mg by mouth daily.    Yes [provider]  benztropine (COGENTIN) 1 MG tablet Take 1 mg by mouth at bedtime.   Yes [provider]  divalproex (DEPAKOTE) 500 MG DR tablet Take 500 mg by mouth 2 (two) times daily. 08/02/21  Yes [provider]  Docusate Sodium (DSS) 100 MG CAPS Take 100 mg by mouth daily as needed (constipation).   Yes [provider]  FLUoxetine (PROZAC) 20 MG capsule Take 20 mg by mouth daily. 07/15/20  Yes [provider]  HYDROcodone-acetaminophen (NORCO/VICODIN) 5-325 MG tablet Take 1 tablet by mouth 4 (four) times daily as needed for moderate pain. 12/02/20  Yes [provider]  iron polysaccharides  (NIFEREX) 150 MG capsule Take 150 mg by mouth every other day.   Yes [provider]  lactose free nutrition (BOOST) LIQD Take 237 mLs by mouth 2 (two) times daily between meals.   Yes [provider]  levETIRAcetam (KEPPRA) 500 MG tablet Take 1 tablet (500 mg total) by mouth 2 (two) times daily. 09/17/15  Yes Charlynne Cousins, MD  levothyroxine (SYNTHROID, LEVOTHROID) 50 MCG tablet Take 50 mcg by mouth daily with breakfast. 10/05/17  Yes [provider]  medroxyPROGESTERone (DEPO-PROVERA) 150 MG/ML injection Inject 150 mg into the muscle every 3 (three) months.   Yes [provider]  potassium chloride SA (K-DUR,KLOR-CON) 20 MEQ tablet Take 20 mEq by mouth daily with breakfast.    Yes [provider]  risperiDONE (RISPERDAL) 3 MG tablet Take 3 mg by mouth at bedtime.   Yes [provider]  vitamin B-12 (CYANOCOBALAMIN) 500 MCG tablet Take 500 mcg by mouth daily.   Yes [provider]  cephALEXin (KEFLEX) 500 MG capsule Take 1 capsule (500 mg total) by mouth 4 (four) times daily. Patient not taking: No sig reported 03/11/21   Thayer Headings, MD  divalproex (DEPAKOTE) 250 MG DR tablet Take 3 tablets (750 mg total) by mouth 2 (two) times daily. Patient not taking: Reported on 08/27/2021 02/04/21 03/06/21  Mariel Aloe, MD     Critical care time: 41 minutes not including any separately billable procedures

## 2021-08-27 NOTE — Progress Notes (Signed)
[facility] PROGRESS NOTE    Progress Note  Jane [name]  PYP:[id number] DOB: 1966-08-11 DOA: 08/26/2021 PCP: [name], MD     Brief Narrative:   Jane [name] is an 55 y.o. female past medical history significant for schizoaffective disorder, end-stage renal disease on peritoneal dialysis, systolic heart failure discitis/osteomyelitis, epidural abscess in April 2020 comes into the emergency room for nausea vomiting diarrhea and a productive cough, was found to have a blood pressure in the 70s tachycardic chest x-ray negative for cardiopulmonary disease, CT scan of the abdomen and pelvis shows small ascites nonspecific bowel wall thickening in bilateral lower lobe pneumonia, hyponatremic, with an unremarkable potassium, albumin 1.5 white count of 10.3 lactic acid of 3.4 she was fluid resuscitated and started on antibiotics in the ED as well as a warm blanket    Assessment/Plan:   Sepsis shock possibly due to lower lobe pneumonia versus secondary bacterial peritonitis: CT findings suspicious for pneumonia, blood cultures were ordered she was fluid resuscitated, 30 ml/kg  and started empirically on Vanco, Flagyl and cefepime. Ascites fluid was sent first cell differential gram stain and culture.  Blood cultures have been sent. Her neck veins are up with lower extremity edema she will not tolerate any more fluids. She has not responded to fluid resuscitation she is going about 130 blood pressure in the 70s we will consult CCM as she may need pressors. Change her status to ICU status. She is not dyspneic on physical exam, satting greater 95% on room air.   Acute metabolic encephalopathy: Likely due to infectious etiology.  Nausea vomiting and diarrhea: Possibly due to pneumonia CT scan showed enteritis and ascites. C. difficile and GI pathogens have been ordered. Abdominal exam  Hypovolemic hypothermia: Possibly due to sepsis.  Checking cortisol level in the  morning.  End stage renal disease: On peritoneal dialysis, renal has been notified.  Chronic systolic heart failure: With an EF of 35% appears hypervolemic on physical exam she was fluid resuscitated.  History of seizure disorder: Continue Depakote and Keppra.  Schizoaffective disorder: Risperdal was held due to her hypothermia. We will go ahead and restart.   Sacral decubitus ulcer stage II present on admission RN Pressure Injury Documentation: Pressure Injury 01/25/21 Sacrum Right;Left Stage 2 -  Partial thickness loss of dermis presenting as a shallow open injury with a red, pink wound bed without slough. (Active)  01/25/21 1951  Location: Sacrum  Location Orientation: Right;Left  Staging: Stage 2 -  Partial thickness loss of dermis presenting as a shallow open injury with a red, pink wound bed without slough.  Wound Description (Comments):   Present on Admission: Yes   DVT prophylaxis: lovenox Family Communication:none Status is: Inpatient  Remains inpatient appropriate because: Septic shock possibly due to pneumonia versus bacterial peritonitis, that did not respond to fluid resuscitation and initial antibiotics cultures were sent admitted to the ICU        Code Status:     Code Status Orders  (From admission, onward)           Start     Ordered   08/27/21 0342  Full code  Continuous        08/27/21 0344           Code Status History     Date Active Date Inactive Code Status Order ID Comments User Context   01/25/2021 1230 02/05/2021 0256 Full Code [id number]  Earnie Larsson, MD Inpatient   10/07/2017 2155 10/09/2017 1745 Full  Code [id number]  Etta Quill, DO ED   04/22/2016 0353 04/22/2016 1741 Full Code [id number]  Rise Patience, MD Inpatient   09/14/2015 2104 09/17/2015 1656 Full Code [id number]  Merton Border, MD Inpatient         IV Access:   Peripheral IV   Procedures and diagnostic studies:   CT ABDOMEN PELVIS WO CONTRAST  Result  Date: 08/27/2021 CLINICAL DATA:  Concern for peritonitis or perforation. EXAM: CT ABDOMEN AND PELVIS WITHOUT CONTRAST TECHNIQUE: Multidetector CT imaging of the abdomen and pelvis was performed following the standard protocol without IV contrast. COMPARISON:  CT abdomen pelvis dated 01/25/2021. FINDINGS: Evaluation of this exam is limited in the absence of intravenous contrast. Lower chest: Diffuse interstitial reticular and nodular densities of the lung bases consistent with pneumonia or aspiration. Clinical correlation is recommended. There is coronary vascular calcification. No intra-abdominal free air.  Small ascites. Hepatobiliary: The liver is unremarkable. No intrahepatic biliary dilatation. There is a stone in the neck of the gallbladder. Pancreas: Unremarkable. No pancreatic ductal dilatation or surrounding inflammatory changes. Spleen: Normal in size without focal abnormality. Adrenals/Urinary Tract: The adrenal glands unremarkable. Moderate bilateral renal parenchyma atrophy and scattered dystrophic calcification versus nonobstructing stones consistent with chronic renal failure. Small bilateral renal cysts suboptimally evaluated. There is no hydronephrosis on either side. The visualized ureters appear unremarkable. The urinary bladder is minimally distended and grossly unremarkable. Stomach/Bowel: There is no bowel obstruction. Diffuse thickened appearance of the small bowel likely related to ascites. Enteritis is less likely. The appendix is normal as visualized. Vascular/Lymphatic: Mild aortoiliac atherosclerotic disease. The IVC is unremarkable. No portal venous gas. There is no adenopathy. Reproductive: The uterus and ovaries are grossly unremarkable. Other: Diffuse subcutaneous edema. Peritoneal dialysis catheter with tip in the right lower quadrant. Musculoskeletal: Osteopenia and renal osteodystrophy. Bilateral femoral head avascular necrosis. No acute osseous pathology. IMPRESSION: 1. Bilateral  lower lobe pneumonia or aspiration. 2. Small ascites. Peritoneal dialysis catheter with tip in the right lower quadrant. 3. Thickened appearance of the small bowel likely related to ascites. Enteritis is less likely. No bowel obstruction. Normal appendix. 4. Cholelithiasis. 5. Aortic Atherosclerosis (ICD10-I70.0). Electronically Signed   By: Anner Crete M.D.   On: 08/27/2021 01:22   DG Chest Portable 1 View  Result Date: 08/27/2021 CLINICAL DATA:  Sepsis EXAM: PORTABLE CHEST 1 VIEW COMPARISON:  None. FINDINGS: The heart size and mediastinal contours are within normal limits. Both lungs are clear. The visualized skeletal structures are unremarkable. IMPRESSION: No active disease. Electronically Signed   By: Ulyses Jarred M.D.   On: 08/27/2021 00:21     Medical Consultants:   None.   Subjective:    Jane [name] confused moaning and groaning  Objective:    Vitals:   08/27/21 0530 08/27/21 0600 08/27/21 0630 08/27/21 0654  BP: (!) 82/57 (!) 76/53 (!) 80/47   Pulse: (!) 115 (!) 123 (!) 129   Resp: (!) 30 (!) 40 (!) 43   Temp:    99.9 F (37.7 C)  TempSrc:    Rectal  SpO2: 95% 97% 94%    SpO2: 94 %   Intake/Output Summary (Last 24 hours) at 08/27/2021 0657 Last data filed at 08/27/2021 0048 Gross per 24 hour  Intake 1000 ml  Output --  Net 1000 ml   There were no vitals filed for this visit.  Exam: General exam: In no acute distress. Respiratory system: Good air movement and crackles at bases Cardiovascular system: S1 & S2 heard,  RRR.  Neck veins to the earlobe Gastrointestinal system: Abdomen is nondistended, soft and nontender.  Central nervous system: Only alert to person not following commands. Extremities: 3+ edema Psychiatry: No judgment or insight of medical condition.   Data Reviewed:    Labs: Basic Metabolic Panel: Recent Labs  Lab 08/26/21 2229 08/26/21 2348  NA 131*  --   K 4.2  --   CL 91*  --   CO2 23  --   GLUCOSE 84  --   BUN 52*  --    CREATININE 4.80*  --   CALCIUM 8.2*  --   MG  --  1.4*   GFR CrCl cannot be calculated (Unknown ideal weight.). Liver Function Tests: Recent Labs  Lab 08/26/21 2229  AST 15  ALT 10  ALKPHOS 182*  BILITOT 0.7  PROT 5.3*  ALBUMIN <1.5*   Recent Labs  Lab 08/26/21 2229  LIPASE 17   No results for input(s): AMMONIA in the last 168 hours. Coagulation profile No results for input(s): INR, PROTIME in the last 168 hours. COVID-19 Labs  No results for input(s): DDIMER, FERRITIN, LDH, CRP in the last 72 hours.  Lab Results  Component Value Date   SARSCOV2NAA NEGATIVE 08/26/2021   [place] NEGATIVE 01/25/2021   [place] Not Detected 06/04/2019    CBC: Recent Labs  Lab 08/26/21 2229  WBC 10.1  NEUTROABS 8.1*  HGB 10.3*  HCT 32.6*  MCV 91.8  PLT 369   Cardiac Enzymes: No results for input(s): CKTOTAL, CKMB, CKMBINDEX, TROPONINI in the last 168 hours. BNP (last 3 results) No results for input(s): PROBNP in the last 8760 hours. CBG: No results for input(s): GLUCAP in the last 168 hours. D-Dimer: No results for input(s): DDIMER in the last 72 hours. Hgb A1c: No results for input(s): HGBA1C in the last 72 hours. Lipid Profile: No results for input(s): CHOL, HDL, LDLCALC, TRIG, CHOLHDL, LDLDIRECT in the last 72 hours. Thyroid function studies: No results for input(s): TSH, T4TOTAL, T3FREE, THYROIDAB in the last 72 hours.  Invalid input(s): FREET3 Anemia work up: No results for input(s): VITAMINB12, FOLATE, FERRITIN, TIBC, IRON, RETICCTPCT in the last 72 hours. Sepsis Labs: Recent Labs  Lab 08/26/21 2229 08/26/21 2348 08/27/21 0121  WBC 10.1  --   --   LATICACIDVEN  --  3.4* 3.0*   Microbiology Recent Results (from the past 240 hour(s))  Resp Panel by RT-PCR (Flu A&B, Covid) Nasopharyngeal Swab     Status: None   Collection Time: 08/26/21 10:54 PM   Specimen: Nasopharyngeal Swab; Nasopharyngeal(NP) swabs in vial transport medium  Result Value Ref  Range Status   SARS Coronavirus 2 by RT PCR NEGATIVE NEGATIVE Final    Comment: (NOTE) SARS-CoV-2 target nucleic acids are NOT DETECTED.  The SARS-CoV-2 RNA is generally detectable in upper respiratory specimens during the acute phase of infection. The lowest concentration of SARS-CoV-2 viral copies this assay can detect is 138 copies/mL. A negative result does not preclude SARS-Cov-2 infection and should not be used as the sole basis for treatment or other patient management decisions. A negative result may occur with  improper specimen collection/handling, submission of specimen other than nasopharyngeal swab, presence of viral mutation(s) within the areas targeted by this assay, and inadequate number of viral copies(<138 copies/mL). A negative result must be combined with clinical observations, patient history, and epidemiological information. The expected result is Negative.  Fact Sheet for Patients:  EntrepreneurPulse.com.au  Fact Sheet for Healthcare Providers:  IncredibleEmployment.be  This test  is no t yet approved or cleared by the [place] and  has been authorized for detection and/or diagnosis of SARS-CoV-2 by FDA under an Emergency Use Authorization (EUA). This EUA will remain  in effect (meaning this test can be used) for the duration of the COVID-19 declaration under Section 564(b)(1) of the Act, 21 U.S.C.section 360bbb-3(b)(1), unless the authorization is terminated  or revoked sooner.       Influenza A by PCR NEGATIVE NEGATIVE Final   Influenza B by PCR NEGATIVE NEGATIVE Final    Comment: (NOTE) The Xpert Xpress SARS-CoV-2/FLU/RSV plus assay is intended as an aid in the diagnosis of influenza from Nasopharyngeal swab specimens and should not be used as a sole basis for treatment. Nasal washings and aspirates are unacceptable for Xpert Xpress SARS-CoV-2/FLU/RSV testing.  Fact Sheet for  Patients: EntrepreneurPulse.com.au  Fact Sheet for Healthcare Providers: IncredibleEmployment.be  This test is not yet approved or cleared by the [place] FDA and has been authorized for detection and/or diagnosis of SARS-CoV-2 by FDA under an Emergency Use Authorization (EUA). This EUA will remain in effect (meaning this test can be used) for the duration of the COVID-19 declaration under Section 564(b)(1) of the Act, 21 U.S.C. section 360bbb-3(b)(1), unless the authorization is terminated or revoked.  Performed at [facility], [place] [address], [place], [place] [address]      Medications:    benztropine  1 mg Oral QHS   divalproex  500 mg Oral BID   FLUoxetine  20 mg Oral Daily   heparin  5,000 Units Subcutaneous Q8H   levETIRAcetam  500 mg Oral BID   levothyroxine  50 mcg Oral Q breakfast   sodium chloride flush  3 mL Intravenous Q12H   Continuous Infusions:  albumin human     [START ON 08/28/2021] ceFEPime (MAXIPIME) IV     metronidazole Stopped (08/27/21 0449)      LOS: 0 days   Charlynne Cousins  [facility]  08/27/2021, 6:57 AM

## 2021-08-27 NOTE — ED Notes (Signed)
RN spoke with Jane [name], dialysis, informed of pending order to collect peritoneal fluid. Provided with name and call back for further instructions.

## 2021-08-27 NOTE — ED Notes (Signed)
Called dialysis nurse to request pull of peritoneal fluid per order. No answer.

## 2021-08-27 NOTE — ED Notes (Signed)
Notified MD Rancour of pt rectal temp of 95.4 who said to place her on a bear hugger. Pt covered with bear hugger at 43degree C. With blanket on top of hugger sheet. Pt states she is very comfortable

## 2021-08-27 NOTE — ED Notes (Signed)
RN requested phlebotomy assistance with collection of past due labs and lactic. Unable to pull blood from IV.

## 2021-08-27 NOTE — Progress Notes (Signed)
SLP Cancellation Note  Patient Details Name: Jane [name] MRN: [id number] DOB: Jan 16, 1966   Cancelled treatment:       Reason Eval/Treat Not Completed: Medical issues which prohibited therapy  She's currently intubated.  Will follow back up to determine readiness for swallowing evaluation.  Shelly Flatten, MA, [facility] SLP [phone]  Lamar Sprinkles 08/27/2021, 1:08 PM

## 2021-08-27 NOTE — Progress Notes (Signed)
Lab notified the writer of this note about bacteria being present in the patient's PD catheter fluid. CCM-Smith, MD & [facility] aware. No new orders.

## 2021-08-27 NOTE — Consult Note (Signed)
[facility] Renal Consultation Note    Indication for Consultation:  Management of ESRD/hemodialysis; anemia, hypertension/volume and secondary hyperparathyroidism  HPI: Jane [name] is a 55 y.o. female with ESRD on PD, schizoaffective disorder, bipolar disorder, seizures, CHF, HTN, hypothyroidism. Last hospital admission in April 2022 with discitis/osteomyelitis/epidural abscess. Now admitted with septic shock. Presented after 1 week of malaise, nausea/vomiting, diarrhea and new cough.  Found to be profoundly hypotensive on admission with SBPs 60-70s. Along with hypothermia and tachycardia. CT Abd showed bilateral lower lobe pneumonia, small ascites.  Labs: Na 132, K 4.8, CO2 20, BUN 53 Cr 4.6, Alb <1.5, WBC 9.5, Hgb 8.8. Received 2.5L IVF bolus in the ED. Blood cultures collected and empiric antibiotics started. Dialysis RN in the room to collect peritoneal fluid/cultures.   Seen and examined with sister at bedside. She has been on PD 6-7 years. Mostly bed bound, family helps her do ambulatory PD daily. Sister states she has been ill for the past week and not eating with n/v,d. She does endorse that her abdomen is "sore". Last dialysis was Friday. They haven't had any trouble doing PD. Sister denies cloudy,bloody effluent.   Dialysis Center: [facility].   Past Medical History:  Diagnosis Date   Abnormal gait    Anemia    Bipolar disorder ([place])    Depression    ESRD on peritoneal dialysis ([place])    GERD (gastroesophageal reflux disease)    Hyperlipemia    Hypertension    Hypothyroidism    Moderately mentally retarded    Renal disease    Schizophrenia ([place])    Seizures ([place])    No recent seizures - no meds   Past Surgical History:  Procedure Laterality Date   BREAST REDUCTION SURGERY     BREAST SURGERY     breast reduction   EXAMINATION UNDER ANESTHESIA N/A 12/08/2013   Procedure: EXAM UNDER ANESTHESIA;  Surgeon: Lavonia Drafts,  MD;  Location: [facility];  Service: Gynecology;  Laterality: N/A;  Pelvic exam and pap smear, unable to tolerate during last office visit   FOOT SURGERY     right   IR FLUORO GUIDE CV LINE RIGHT  02/01/2021   IR REMOVAL TUN CV CATH W/O FL  03/15/2021   IR US GUIDE VASC ACCESS RIGHT  02/01/2021   LUMBAR LAMINECTOMY/DECOMPRESSION MICRODISCECTOMY N/A 01/25/2021   Procedure: Lumbar Five -Sacral One Laminectomy for Epidural Abscess;  Surgeon: Earnie Larsson, MD;  Location: [facility];  Service: Neurosurgery;  Laterality: N/A;   REDUCTION MAMMAPLASTY Bilateral    Family History  Problem Relation Age of Onset   Hypertension Other    Breast cancer Mother 3   Social History:  reports that she has never smoked. She has never used smokeless tobacco. She reports that she does not drink alcohol and does not use drugs. Allergies  Allergen Reactions   Icodextrin Rash   Prior to Admission medications   Medication Sig Start Date End Date Taking? Authorizing Provider  albuterol (PROVENTIL HFA;VENTOLIN HFA) 108 (90 Base) MCG/ACT inhaler Inhale 2 puffs into the lungs every 6 (six) hours as needed for wheezing or shortness of breath.   Yes [provider]  B Complex-C-Folic Acid (DIALYVITE 703) 0.8 MG TABS Take 0.8 mg by mouth daily.    Yes [provider]  benztropine (COGENTIN) 1 MG tablet Take 1 mg by mouth at bedtime.   Yes [provider]  divalproex (DEPAKOTE) 500 MG DR tablet Take 500 mg by  mouth 2 (two) times daily. 08/02/21  Yes [provider]  Docusate Sodium (DSS) 100 MG CAPS Take 100 mg by mouth daily as needed (constipation).   Yes [provider]  FLUoxetine (PROZAC) 20 MG capsule Take 20 mg by mouth daily. 07/15/20  Yes [provider]  HYDROcodone-acetaminophen (NORCO/VICODIN) 5-325 MG tablet Take 1 tablet by mouth 4 (four) times daily as needed for moderate pain. 12/02/20  Yes [provider]  iron polysaccharides (NIFEREX) 150 MG capsule Take  150 mg by mouth every other day.   Yes [provider]  lactose free nutrition (BOOST) LIQD Take 237 mLs by mouth 2 (two) times daily between meals.   Yes [provider]  levETIRAcetam (KEPPRA) 500 MG tablet Take 1 tablet (500 mg total) by mouth 2 (two) times daily. 09/17/15  Yes Charlynne Cousins, MD  levothyroxine (SYNTHROID, LEVOTHROID) 50 MCG tablet Take 50 mcg by mouth daily with breakfast. 10/05/17  Yes [provider]  medroxyPROGESTERone (DEPO-PROVERA) 150 MG/ML injection Inject 150 mg into the muscle every 3 (three) months.   Yes [provider]  potassium chloride SA (K-DUR,KLOR-CON) 20 MEQ tablet Take 20 mEq by mouth daily with breakfast.    Yes [provider]  risperiDONE (RISPERDAL) 3 MG tablet Take 3 mg by mouth at bedtime.   Yes [provider]  vitamin B-12 (CYANOCOBALAMIN) 500 MCG tablet Take 500 mcg by mouth daily.   Yes [provider]  cephALEXin (KEFLEX) 500 MG capsule Take 1 capsule (500 mg total) by mouth 4 (four) times daily. Patient not taking: No sig reported 03/11/21   Thayer Headings, MD  divalproex (DEPAKOTE) 250 MG DR tablet Take 3 tablets (750 mg total) by mouth 2 (two) times daily. Patient not taking: Reported on 08/27/2021 02/04/21 03/06/21  Mariel Aloe, MD   Current Facility-Administered Medications  Medication Dose Route Frequency Provider Last Rate Last Admin   0.9 %  sodium chloride infusion  250 mL Intravenous Continuous Candee Furbish, MD   Held at 08/27/21 1305   acetaminophen (TYLENOL) tablet 650 mg  650 mg Oral Q6H PRN Opyd, Ilene Qua, MD       Or   acetaminophen (TYLENOL) suppository 650 mg  650 mg Rectal Q6H PRN Opyd, Ilene Qua, MD       albumin human 25 % solution 25 g  25 g Intravenous Q6H Candee Furbish, MD 60 mL/hr at 08/27/21 0952 25 g at 08/27/21 0952   albuterol (PROVENTIL) (2.5 MG/3ML) 0.083% nebulizer solution 2.5 mg  2.5 mg Inhalation Q6H PRN Opyd, Ilene Qua, MD        benztropine (COGENTIN) tablet 1 mg  1 mg Oral QHS Opyd, Ilene Qua, MD       [START ON 08/28/2021] ceFEPIme (MAXIPIME) 2 g in sodium chloride 0.9 % 100 mL IVPB  2 g Intravenous Q48H Ledford, James L, RPH       Chlorhexidine Gluconate Cloth 2 % PADS 6 each  6 each Topical Daily Candee Furbish, MD   6 each at 08/27/21 1249   divalproex (DEPAKOTE) DR tablet 500 mg  500 mg Oral BID Opyd, Ilene Qua, MD   500 mg at 08/27/21 1047   FLUoxetine (PROZAC) capsule 20 mg  20 mg Oral Daily Opyd, Ilene Qua, MD   20 mg at 08/27/21 1047   heparin injection 5,000 Units  5,000 Units Subcutaneous Q8H Opyd, Ilene Qua, MD       HYDROcodone-acetaminophen (NORCO/VICODIN) 5-325 MG per  tablet 1 tablet  1 tablet Oral QID PRN Opyd, Ilene Qua, MD       levETIRAcetam (KEPPRA) tablet 500 mg  500 mg Oral BID Opyd, Ilene Qua, MD   500 mg at 08/27/21 3244   levothyroxine (SYNTHROID) tablet 50 mcg  50 mcg Oral Q breakfast Opyd, Ilene Qua, MD   50 mcg at 08/27/21 0949   metroNIDAZOLE (FLAGYL) IVPB 500 mg  500 mg Intravenous Q12H Vianne Bulls, MD   Stopped at 08/27/21 0449   midodrine (PROAMATINE) tablet 5 mg  5 mg Oral TID WC Candee Furbish, MD       norepinephrine (LEVOPHED) 4mg  in 236mL premix infusion  2-10 mcg/min Intravenous Titrated Candee Furbish, MD   Held at 08/27/21 0953   ondansetron (ZOFRAN) tablet 4 mg  4 mg Oral Q6H PRN Opyd, Ilene Qua, MD       Or   ondansetron (ZOFRAN) injection 4 mg  4 mg Intravenous Q6H PRN Opyd, Ilene Qua, MD       risperiDONE (RISPERDAL) tablet 3 mg  3 mg Oral QHS Charlynne Cousins, MD       sodium chloride flush (NS) 0.9 % injection 3 mL  3 mL Intravenous Q12H Opyd, Ilene Qua, MD   3 mL at 08/27/21 1300     ROS: As per HPI otherwise negative.  Physical Exam: Vitals:   08/27/21 1135 08/27/21 1200 08/27/21 1230 08/27/21 1300  BP:  (!) 85/56  92/65  Pulse:  100 (!) 101 96  Resp:  (!) 23 (!) 24 18  Temp: 98.5 F (36.9 C)     TempSrc: Oral     SpO2:  98% 99% 98%     General:  Alert, non-toxic appearing, nad, on RA  Head: NCAT sclera not icteric MMM Neck: Supple. No JVD appreciated  Lungs: CTA bilaterally without wheezes, rales, or rhonchi.  Heart: RRR with S1 S2 Abdomen: soft mild LQ tenderness Lower extremities: 1+ LE edema, bilaterally. No open wounds  Neuro: Follows commands. Moves all extremities spontaneously. Psych:  Responds to questions appropriately. Mumbles to herself Dialysis Access: PD cath in place   Labs: Basic Metabolic Panel: Recent Labs  Lab 08/26/21 2229 08/27/21 0735  NA 131* 132*  K 4.2 4.8  CL 91* 98  CO2 23 20*  GLUCOSE 84 46*  BUN 52* 53*  CREATININE 4.80* 4.65*  CALCIUM 8.2* 7.5*   Liver Function Tests: Recent Labs  Lab 08/26/21 2229 08/27/21 0735  AST 15 17  ALT 10 11  ALKPHOS 182* 178*  BILITOT 0.7 0.5  PROT 5.3* 4.6*  ALBUMIN <1.5* <1.5*   Recent Labs  Lab 08/26/21 2229  LIPASE 17   No results for input(s): AMMONIA in the last 168 hours. CBC: Recent Labs  Lab 08/26/21 2229 08/27/21 0735  WBC 10.1 9.5  NEUTROABS 8.1*  --   HGB 10.3* 8.8*  HCT 32.6* 26.9*  MCV 91.8 92.1  PLT 369 283   Cardiac Enzymes: No results for input(s): CKTOTAL, CKMB, CKMBINDEX, TROPONINI in the last 168 hours. CBG: No results for input(s): GLUCAP in the last 168 hours. Iron Studies: No results for input(s): IRON, TIBC, TRANSFERRIN, FERRITIN in the last 72 hours. Studies/Results: CT ABDOMEN PELVIS WO CONTRAST  Result Date: 08/27/2021 CLINICAL DATA:  Concern for peritonitis or perforation. EXAM: CT ABDOMEN AND PELVIS WITHOUT CONTRAST TECHNIQUE: Multidetector CT imaging of the abdomen and pelvis was performed following the standard protocol without IV contrast. COMPARISON:  CT abdomen pelvis dated 01/25/2021.  FINDINGS: Evaluation of this exam is limited in the absence of intravenous contrast. Lower chest: Diffuse interstitial reticular and nodular densities of the lung bases consistent with pneumonia or aspiration. Clinical  correlation is recommended. There is coronary vascular calcification. No intra-abdominal free air.  Small ascites. Hepatobiliary: The liver is unremarkable. No intrahepatic biliary dilatation. There is a stone in the neck of the gallbladder. Pancreas: Unremarkable. No pancreatic ductal dilatation or surrounding inflammatory changes. Spleen: Normal in size without focal abnormality. Adrenals/Urinary Tract: The adrenal glands unremarkable. Moderate bilateral renal parenchyma atrophy and scattered dystrophic calcification versus nonobstructing stones consistent with chronic renal failure. Small bilateral renal cysts suboptimally evaluated. There is no hydronephrosis on either side. The visualized ureters appear unremarkable. The urinary bladder is minimally distended and grossly unremarkable. Stomach/Bowel: There is no bowel obstruction. Diffuse thickened appearance of the small bowel likely related to ascites. Enteritis is less likely. The appendix is normal as visualized. Vascular/Lymphatic: Mild aortoiliac atherosclerotic disease. The IVC is unremarkable. No portal venous gas. There is no adenopathy. Reproductive: The uterus and ovaries are grossly unremarkable. Other: Diffuse subcutaneous edema. Peritoneal dialysis catheter with tip in the right lower quadrant. Musculoskeletal: Osteopenia and renal osteodystrophy. Bilateral femoral head avascular necrosis. No acute osseous pathology. IMPRESSION: 1. Bilateral lower lobe pneumonia or aspiration. 2. Small ascites. Peritoneal dialysis catheter with tip in the right lower quadrant. 3. Thickened appearance of the small bowel likely related to ascites. Enteritis is less likely. No bowel obstruction. Normal appendix. 4. Cholelithiasis. 5. Aortic Atherosclerosis (ICD10-I70.0). Electronically Signed   By: Anner Crete M.D.   On: 08/27/2021 01:22   DG Chest Portable 1 View  Result Date: 08/27/2021 CLINICAL DATA:  Sepsis EXAM: PORTABLE CHEST 1 VIEW COMPARISON:  None.  FINDINGS: The heart size and mediastinal contours are within normal limits. Both lungs are clear. The visualized skeletal structures are unremarkable. IMPRESSION: No active disease. Electronically Signed   By: Ulyses Jarred M.D.   On: 08/27/2021 00:21    Dialysis Orders:  [facility]  [facility] [id number] (HT not open on weekends)  OP Nephrologist: Dimas Chyle  Ambulatory PD  7x/week  4 exchanges 2L   Assessment/Plan: Sepsis-- w/u in progress. Concern for aspiration PNA on CT. Blood and fluid cultures pending. On Vanc/cefepime/Flagyl.Per primary.  Hypotension/volume  - Hypotensive 2/2 shock. S/p 3.5L IVF bolus, albumin. Pressor support on admission. Midodrine started. ESRD -  Will do CCPD here. Resume tonight. All 1.5% bags for now.  Anemia  - Hgb 8.8. Follow trends. Will get OP records for meds.  Metabolic bone disease -  Corr Ca ok. Continue home meds.  Nutrition - Alb <2. Prot supp when taking PO Schizoaffective/Bipolar disorder - meds per primary   Lynnda Child PA-C [facility] 08/27/2021, 1:15 PM

## 2021-08-27 NOTE — Progress Notes (Signed)
Attempted to call RN to discuss assignment to [facility], RN not available.  Rejected assignment to 2C to Pt is hypotensive and temperature is 95.4, unstable for unit.

## 2021-08-27 NOTE — Progress Notes (Addendum)
Pharmacy Antibiotic Note  Jane [name] is a 55 y.o. female admitted on 08/26/2021 with  sepsis/pneumonia .  Pharmacy has been consulted for Vancomycin dosing. WBC WNL. Pt is hypothermic and hypotensive. Pt is on peritoneal dialysis.   Plan: Vancomycin 1500 mg IV x 1 Check vancomycin level in 72 hours Re-dose vancomycin as indicated Cefepime 2g IV q48h Flagyl per MD  Temp (24hrs), Avg:97 F (36.1 C), Min:95.3 F (35.2 C), Max:98.7 F (37.1 C)  Recent Labs  Lab 08/26/21 2229 08/26/21 2348 08/27/21 0121  WBC 10.1  --   --   CREATININE 4.80*  --   --   LATICACIDVEN  --  3.4* 3.0*    CrCl cannot be calculated (Unknown ideal weight.).    Allergies  Allergen Reactions   Icodextrin Rash    Narda Bonds, PharmD, BCPS Clinical Pharmacist Phone: [phone]

## 2021-08-27 NOTE — H&P (Addendum)
History and Physical    Rogelio MAKELLE MARRONE RCV:[id number] DOB: 1965/11/06 DOA: 08/26/2021  PCP: [name], MD   Patient coming from: Home   Chief Complaint: N/V/D, cough   HPI: Jane [name] is a pleasant 55 y.o. female with medical history significant for schizoaffective disorder, seizures, ESRD on peritoneal dialysis, chronic systolic CHF, hypothyroidism, and discitis-osteomyelitis and epidural abscess in April 2022, now presenting to the emergency department with a week of nausea, vomiting, diarrhea, and productive cough.  She is accompanied by her sister who assists with the history.  Multiple family members had been ill with headaches, malaise, and cough, and then the patient developed nausea, nonbloody vomiting, and diarrhea.  She continued to have nausea, vomiting, and diarrhea and then also developed a productive cough.  She had been complaining of abdominal pain as well.  Patient has not ambulated since her low back surgery in April, he is either in bed or wheelchair, but is able to feed and dress herself.  She does PD at home.  ED Course: Upon arrival to the ED, patient is found to have a rectal temperature of 35.2 C, saturating well on room air, tachypneic, tachycardic, and with systolic blood pressure in the 70s.  EKG features sinus tachycardia with rate 111 and RAD.  Chest x-ray is negative acute cardiopulmonary disease.  CT of the abdomen and pelvis demonstrates small ascites, nonspecific bowel wall thickening, and bilateral lower lobe pneumonia or aspiration.  Chemistry panel features a sodium 131, normal bicarbonate, normal potassium, BUN 52, and albumin <1.5.  CBC with hemoglobin 10.3.  Initial lactic acid 3.4.  Blood culture was collected in the ED, 2.5 L of saline was administered, and the patient was started on vancomycin and cefepime.  Warming blanket was applied.  Review of Systems:  All other systems reviewed and apart from HPI, are negative.  Past Medical History:   Diagnosis Date   Abnormal gait    Anemia    Bipolar disorder (HCC)    Depression    ESRD on peritoneal dialysis ([place])    GERD (gastroesophageal reflux disease)    Hyperlipemia    Hypertension    Hypothyroidism    Moderately mentally retarded    Renal disease    Schizophrenia ([place])    Seizures ([place])    No recent seizures - no meds    Past Surgical History:  Procedure Laterality Date   BREAST REDUCTION SURGERY     BREAST SURGERY     breast reduction   EXAMINATION UNDER ANESTHESIA N/A 12/08/2013   Procedure: EXAM UNDER ANESTHESIA;  Surgeon: Lavonia Drafts, MD;  Location: [facility];  Service: Gynecology;  Laterality: N/A;  Pelvic exam and pap smear, unable to tolerate during last office visit   FOOT SURGERY     right   IR FLUORO GUIDE CV LINE RIGHT  02/01/2021   IR REMOVAL TUN CV CATH W/O FL  03/15/2021   IR US GUIDE VASC ACCESS RIGHT  02/01/2021   LUMBAR LAMINECTOMY/DECOMPRESSION MICRODISCECTOMY N/A 01/25/2021   Procedure: Lumbar Five -Sacral One Laminectomy for Epidural Abscess;  Surgeon: Earnie Larsson, MD;  Location: [facility];  Service: Neurosurgery;  Laterality: N/A;   REDUCTION MAMMAPLASTY Bilateral     Social History:   reports that she has never smoked. She has never used smokeless tobacco. She reports that she does not drink alcohol and does not use drugs.  Allergies  Allergen Reactions   Icodextrin Rash    Family History  Problem Relation Age of Onset  Hypertension Other    Breast cancer Mother 60     Prior to Admission medications   Medication Sig Start Date End Date Taking? Authorizing Provider  albuterol (PROVENTIL HFA;VENTOLIN HFA) 108 (90 Base) MCG/ACT inhaler Inhale 2 puffs into the lungs every 6 (six) hours as needed for wheezing or shortness of breath.   Yes [provider]  B Complex-C-Folic Acid (DIALYVITE 811) 0.8 MG TABS Take 0.8 mg by mouth daily.    Yes [provider]  benztropine (COGENTIN) 1 MG tablet Take 1 mg by mouth at  bedtime.   Yes [provider]  divalproex (DEPAKOTE) 500 MG DR tablet Take 500 mg by mouth 2 (two) times daily. 08/02/21  Yes [provider]  Docusate Sodium (DSS) 100 MG CAPS Take 100 mg by mouth daily as needed (constipation).   Yes [provider]  FLUoxetine (PROZAC) 20 MG capsule Take 20 mg by mouth daily. 07/15/20  Yes [provider]  HYDROcodone-acetaminophen (NORCO/VICODIN) 5-325 MG tablet Take 1 tablet by mouth 4 (four) times daily as needed for moderate pain. 12/02/20  Yes [provider]  iron polysaccharides (NIFEREX) 150 MG capsule Take 150 mg by mouth every other day.   Yes [provider]  lactose free nutrition (BOOST) LIQD Take 237 mLs by mouth 2 (two) times daily between meals.   Yes [provider]  levETIRAcetam (KEPPRA) 500 MG tablet Take 1 tablet (500 mg total) by mouth 2 (two) times daily. 09/17/15  Yes Charlynne Cousins, MD  levothyroxine (SYNTHROID, LEVOTHROID) 50 MCG tablet Take 50 mcg by mouth daily with breakfast. 10/05/17  Yes [provider]  medroxyPROGESTERone (DEPO-PROVERA) 150 MG/ML injection Inject 150 mg into the muscle every 3 (three) months.   Yes [provider]  potassium chloride SA (K-DUR,KLOR-CON) 20 MEQ tablet Take 20 mEq by mouth daily with breakfast.    Yes [provider]  risperiDONE (RISPERDAL) 3 MG tablet Take 3 mg by mouth at bedtime.   Yes [provider]  vitamin B-12 (CYANOCOBALAMIN) 500 MCG tablet Take 500 mcg by mouth daily.   Yes [provider]  cephALEXin (KEFLEX) 500 MG capsule Take 1 capsule (500 mg total) by mouth 4 (four) times daily. Patient not taking: No sig reported 03/11/21   Thayer Headings, MD  divalproex (DEPAKOTE) 250 MG DR tablet Take 3 tablets (750 mg total) by mouth 2 (two) times daily. Patient not taking: Reported on 08/27/2021 02/04/21 03/06/21  Mariel Aloe, MD    Physical Exam: Vitals:   08/27/21 0300  08/27/21 0310 08/27/21 0325 08/27/21 0330  BP: (!) 89/58 (!) 86/58  (!) 93/59  Pulse: 93 95 96 98  Resp: 18 (!) 24 20 (!) 24  Temp:      TempSrc:      SpO2: 98% 99% 97% 95%    Constitutional: No diaphoresis or pallor, calm  Eyes: PERTLA, lids and conjunctivae normal ENMT: Mucous membranes are dry. Posterior pharynx clear of any exudate or lesions.   Neck: supple, no masses  Respiratory: coarse rhonchi, no wheezing. Tachypneic.   Cardiovascular: Rate ~120 and regular. Pretibial pitting edema bilaterally. Abdomen: Mild distension, soft, no rebound pain or guarding. Bowel sounds active.  Musculoskeletal: no clubbing / cyanosis. No joint deformity upper and lower extremities.   Skin: Poor turgor. Warm, dry, well-perfused. Neurologic: CN 2-12 grossly intact. Moving all extremities. Alert and oriented to person and place.  Psychiatric: Crying briefly. Distracted. Cooperative.    Labs and Imaging on  Admission: I have personally reviewed following labs and imaging studies  CBC: Recent Labs  Lab 08/26/21 2229  WBC 10.1  NEUTROABS 8.1*  HGB 10.3*  HCT 32.6*  MCV 91.8  PLT 025   Basic Metabolic Panel: Recent Labs  Lab 08/26/21 2229 08/26/21 2348  NA 131*  --   K 4.2  --   CL 91*  --   CO2 23  --   GLUCOSE 84  --   BUN 52*  --   CREATININE 4.80*  --   CALCIUM 8.2*  --   MG  --  1.4*   GFR: CrCl cannot be calculated (Unknown ideal weight.). Liver Function Tests: Recent Labs  Lab 08/26/21 2229  AST 15  ALT 10  ALKPHOS 182*  BILITOT 0.7  PROT 5.3*  ALBUMIN <1.5*   Recent Labs  Lab 08/26/21 2229  LIPASE 17   No results for input(s): AMMONIA in the last 168 hours. Coagulation Profile: No results for input(s): INR, PROTIME in the last 168 hours. Cardiac Enzymes: No results for input(s): CKTOTAL, CKMB, CKMBINDEX, TROPONINI in the last 168 hours. BNP (last 3 results) No results for input(s): PROBNP in the last 8760 hours. HbA1C: No results for input(s): HGBA1C  in the last 72 hours. CBG: No results for input(s): GLUCAP in the last 168 hours. Lipid Profile: No results for input(s): CHOL, HDL, LDLCALC, TRIG, CHOLHDL, LDLDIRECT in the last 72 hours. Thyroid Function Tests: No results for input(s): TSH, T4TOTAL, FREET4, T3FREE, THYROIDAB in the last 72 hours. Anemia Panel: No results for input(s): VITAMINB12, FOLATE, FERRITIN, TIBC, IRON, RETICCTPCT in the last 72 hours. Urine analysis:    Component Value Date/Time   COLORURINE YELLOW 08/26/2021 2226   APPEARANCEUR CLEAR 08/26/2021 2226   LABSPEC 1.010 08/26/2021 2226   PHURINE 5.0 08/26/2021 2226   GLUCOSEU NEGATIVE 08/26/2021 2226   HGBUR NEGATIVE 08/26/2021 2226   HGBUR trace-intact 04/12/2010 0904   BILIRUBINUR NEGATIVE 08/26/2021 2226   KETONESUR NEGATIVE 08/26/2021 2226   PROTEINUR NEGATIVE 08/26/2021 2226   UROBILINOGEN 1.0 07/14/2012 2005   NITRITE NEGATIVE 08/26/2021 2226   LEUKOCYTESUR TRACE (A) 08/26/2021 2226   Sepsis Labs: @LABRCNTIP (procalcitonin:4,lacticidven:4) ) Recent Results (from the past 240 hour(s))  Resp Panel by RT-PCR (Flu A&B, Covid) Nasopharyngeal Swab     Status: None   Collection Time: 08/26/21 10:54 PM   Specimen: Nasopharyngeal Swab; Nasopharyngeal(NP) swabs in vial transport medium  Result Value Ref Range Status   SARS Coronavirus 2 by RT PCR NEGATIVE NEGATIVE Final    Comment: (NOTE) SARS-CoV-2 target nucleic acids are NOT DETECTED.  The SARS-CoV-2 RNA is generally detectable in upper respiratory specimens during the acute phase of infection. The lowest concentration of SARS-CoV-2 viral copies this assay can detect is 138 copies/mL. A negative result does not preclude SARS-Cov-2 infection and should not be used as the sole basis for treatment or other patient management decisions. A negative result may occur with  improper specimen collection/handling, submission of specimen other than nasopharyngeal swab, presence of viral mutation(s) within  the areas targeted by this assay, and inadequate number of viral copies(<138 copies/mL). A negative result must be combined with clinical observations, patient history, and epidemiological information. The expected result is Negative.  Fact Sheet for Patients:  EntrepreneurPulse.com.au  Fact Sheet for Healthcare Providers:  IncredibleEmployment.be  This test is no t yet approved or cleared by the [place] FDA and  has been authorized for detection and/or diagnosis of SARS-CoV-2 by FDA under an Emergency Use Authorization (  EUA). This EUA will remain  in effect (meaning this test can be used) for the duration of the COVID-19 declaration under Section 564(b)(1) of the Act, 21 U.S.C.section 360bbb-3(b)(1), unless the authorization is terminated  or revoked sooner.       Influenza A by PCR NEGATIVE NEGATIVE Final   Influenza B by PCR NEGATIVE NEGATIVE Final    Comment: (NOTE) The Xpert Xpress SARS-CoV-2/FLU/RSV plus assay is intended as an aid in the diagnosis of influenza from Nasopharyngeal swab specimens and should not be used as a sole basis for treatment. Nasal washings and aspirates are unacceptable for Xpert Xpress SARS-CoV-2/FLU/RSV testing.  Fact Sheet for Patients: EntrepreneurPulse.com.au  Fact Sheet for Healthcare Providers: IncredibleEmployment.be  This test is not yet approved or cleared by the [place] FDA and has been authorized for detection and/or diagnosis of SARS-CoV-2 by FDA under an Emergency Use Authorization (EUA). This EUA will remain in effect (meaning this test can be used) for the duration of the COVID-19 declaration under Section 564(b)(1) of the Act, 21 U.S.C. section 360bbb-3(b)(1), unless the authorization is terminated or revoked.  Performed at [facility], [place] [address], [place], [place] [address]      Radiological Exams on Admission: CT ABDOMEN  PELVIS WO CONTRAST  Result Date: 08/27/2021 CLINICAL DATA:  Concern for peritonitis or perforation. EXAM: CT ABDOMEN AND PELVIS WITHOUT CONTRAST TECHNIQUE: Multidetector CT imaging of the abdomen and pelvis was performed following the standard protocol without IV contrast. COMPARISON:  CT abdomen pelvis dated 01/25/2021. FINDINGS: Evaluation of this exam is limited in the absence of intravenous contrast. Lower chest: Diffuse interstitial reticular and nodular densities of the lung bases consistent with pneumonia or aspiration. Clinical correlation is recommended. There is coronary vascular calcification. No intra-abdominal free air.  Small ascites. Hepatobiliary: The liver is unremarkable. No intrahepatic biliary dilatation. There is a stone in the neck of the gallbladder. Pancreas: Unremarkable. No pancreatic ductal dilatation or surrounding inflammatory changes. Spleen: Normal in size without focal abnormality. Adrenals/Urinary Tract: The adrenal glands unremarkable. Moderate bilateral renal parenchyma atrophy and scattered dystrophic calcification versus nonobstructing stones consistent with chronic renal failure. Small bilateral renal cysts suboptimally evaluated. There is no hydronephrosis on either side. The visualized ureters appear unremarkable. The urinary bladder is minimally distended and grossly unremarkable. Stomach/Bowel: There is no bowel obstruction. Diffuse thickened appearance of the small bowel likely related to ascites. Enteritis is less likely. The appendix is normal as visualized. Vascular/Lymphatic: Mild aortoiliac atherosclerotic disease. The IVC is unremarkable. No portal venous gas. There is no adenopathy. Reproductive: The uterus and ovaries are grossly unremarkable. Other: Diffuse subcutaneous edema. Peritoneal dialysis catheter with tip in the right lower quadrant. Musculoskeletal: Osteopenia and renal osteodystrophy. Bilateral femoral head avascular necrosis. No acute osseous  pathology. IMPRESSION: 1. Bilateral lower lobe pneumonia or aspiration. 2. Small ascites. Peritoneal dialysis catheter with tip in the right lower quadrant. 3. Thickened appearance of the small bowel likely related to ascites. Enteritis is less likely. No bowel obstruction. Normal appendix. 4. Cholelithiasis. 5. Aortic Atherosclerosis (ICD10-I70.0). Electronically Signed   By: Anner Crete M.D.   On: 08/27/2021 01:22   DG Chest Portable 1 View  Result Date: 08/27/2021 CLINICAL DATA:  Sepsis EXAM: PORTABLE CHEST 1 VIEW COMPARISON:  None. FINDINGS: The heart size and mediastinal contours are within normal limits. Both lungs are clear. The visualized skeletal structures are unremarkable. IMPRESSION: No active disease. Electronically Signed   By: Ulyses Jarred M.D.   On: 08/27/2021 00:21  EKG: Independently reviewed. Sinus tachycardia, rate 111, RAD.   Assessment/Plan   1. Suspected sepsis  - Presents with 1 wk N/V/D and productive cough and found to be hypothermic and hypotensive with tachycardia and elevated lactate  - CT findings suspicious for aspiration  - Blood culture collected in ED, >30 cc/kg IVF given, and vancomycin and cefepime started; dialysis nurse asked to draw peritoneal fluid for analysis  - Continue empiric abx, add Flagyl for possible aspiration PNA, follow-up peritoneal fluid analysis, trend lactate, follow cultures and clinical course   2. Aspiration pneumonia  - Presents with N/V/D and productive cough and has CT findings suspicious for aspiration  - Check/trend procalcitonin, continue antibiotics, consult SLP    3. N/V/D  - Presents with 1 wk of N/V/D  - Abd exam benign, bowel wall thickening on CT could reflect enteritis or ascites  - She appeared hypovolemic and was fluid-resuscitated in ED  - C diff and GI pathogen panel ordered, continue enteric precautions for now, monitor electrolytes and volume status    4. Hypotension  - SBP 70s initially, improved to 90s  with 2.5 L IVF  - Likely secondary to sepsis and/or hypovolemia in setting of 1 wk of N/V/D  - Check cortisol, continue empiric abx    5. Hypothermia  - Rectal temp 35.2 C  - Possibly from sepsis or Risperdal  - Continue empiric antibiotics as above, hold Risperdal, check cortisol and TSH   6. ESRD  - Does PD at home  - No indication for urgent dialysis on admission  - Consult nephrology in am    7. Chronic systolic CHF  - EF 77-93% on TTE in August 2022  - Appeared hypovolemic in ED and was given 2.5 liters IVF  - Caution with IVF, monitor wt and I/Os   8. Seizures  - No seizures in >1 yr per sister  - Continue Keppra, Depakote    9. Schizoaffective disorder  - Hold Risperdal until hypothermia resolves    DVT prophylaxis: sq heparin  Code Status: Full  Level of Care: Level of care: Progressive Family Communication: Sister updated at bedside  Disposition Plan:  Patient is from: Home  Anticipated d/c is to: TBD Anticipated d/c date is: 08/30/21 Patient currently: Pending peritoneal fluid analysis, cultures, improved/stable BP Consults called: none  Admission status: Inpatient     Vianne Bulls, MD [facility]  08/27/2021, 3:46 AM

## 2021-08-27 NOTE — ED Notes (Signed)
Called dialysis nurse to pull peritoneal fluid. Stated that he will be down when he can.

## 2021-08-27 NOTE — Sepsis Progress Note (Signed)
Elink following Code Sepsis. 

## 2021-08-28 ENCOUNTER — Encounter (HOSPITAL_COMMUNITY): Payer: Self-pay | Admitting: Internal Medicine

## 2021-08-28 DIAGNOSIS — A419 Sepsis, unspecified organism: Secondary | ICD-10-CM | POA: Diagnosis not present

## 2021-08-28 DIAGNOSIS — N186 End stage renal disease: Secondary | ICD-10-CM

## 2021-08-28 DIAGNOSIS — G9341 Metabolic encephalopathy: Secondary | ICD-10-CM

## 2021-08-28 DIAGNOSIS — R7881 Bacteremia: Secondary | ICD-10-CM | POA: Diagnosis not present

## 2021-08-28 DIAGNOSIS — Z992 Dependence on renal dialysis: Secondary | ICD-10-CM

## 2021-08-28 DIAGNOSIS — K652 Spontaneous bacterial peritonitis: Secondary | ICD-10-CM

## 2021-08-28 DIAGNOSIS — J690 Pneumonitis due to inhalation of food and vomit: Secondary | ICD-10-CM | POA: Diagnosis not present

## 2021-08-28 DIAGNOSIS — R6520 Severe sepsis without septic shock: Secondary | ICD-10-CM

## 2021-08-28 LAB — CBC
HCT: 18.7 % — ABNORMAL LOW (ref 36.0–46.0)
Hemoglobin: 6 g/dL — CL (ref 12.0–15.0)
MCH: 29.9 pg (ref 26.0–34.0)
MCHC: 32.1 g/dL (ref 30.0–36.0)
MCV: 93 fL (ref 80.0–100.0)
Platelets: 188 10*3/uL (ref 150–400)
RBC: 2.01 MIL/uL — ABNORMAL LOW (ref 3.87–5.11)
RDW: 18.6 % — ABNORMAL HIGH (ref 11.5–15.5)
WBC: 5 10*3/uL (ref 4.0–10.5)
nRBC: 0 % (ref 0.0–0.2)

## 2021-08-28 LAB — GLUCOSE, CAPILLARY
Glucose-Capillary: 112 mg/dL — ABNORMAL HIGH (ref 70–99)
Glucose-Capillary: 57 mg/dL — ABNORMAL LOW (ref 70–99)
Glucose-Capillary: 119 mg/dL — ABNORMAL HIGH (ref 70–99)
Glucose-Capillary: 81 mg/dL (ref 70–99)
Glucose-Capillary: 110 mg/dL — ABNORMAL HIGH (ref 70–99)
Glucose-Capillary: 57 mg/dL — ABNORMAL LOW (ref 70–99)
Glucose-Capillary: 112 mg/dL — ABNORMAL HIGH (ref 70–99)
Glucose-Capillary: 121 mg/dL — ABNORMAL HIGH (ref 70–99)

## 2021-08-28 LAB — COMPREHENSIVE METABOLIC PANEL
ALT: 8 U/L (ref 0–44)
AST: 11 U/L — ABNORMAL LOW (ref 15–41)
Albumin: 2.4 g/dL — ABNORMAL LOW (ref 3.5–5.0)
Alkaline Phosphatase: 100 U/L (ref 38–126)
Anion gap: 12 (ref 5–15)
BUN: 48 mg/dL — ABNORMAL HIGH (ref 6–20)
CO2: 21 mmol/L — ABNORMAL LOW (ref 22–32)
Calcium: 8.4 mg/dL — ABNORMAL LOW (ref 8.9–10.3)
Chloride: 99 mmol/L (ref 98–111)
Creatinine, Ser: 4.26 mg/dL — ABNORMAL HIGH (ref 0.44–1.00)
GFR, Estimated: 12 mL/min — ABNORMAL LOW (ref >60)
Glucose, Bld: 43 mg/dL — CL (ref 70–99)
Potassium: 3.7 mmol/L (ref 3.5–5.1)
Sodium: 132 mmol/L — ABNORMAL LOW (ref 135–145)
Total Bilirubin: 0.2 mg/dL — ABNORMAL LOW (ref 0.3–1.2)
Total Protein: 4.5 g/dL — ABNORMAL LOW (ref 6.5–8.1)

## 2021-08-28 LAB — BLOOD CULTURE ID PANEL (REFLEXED) - BCID2

## 2021-08-28 LAB — OCCULT BLOOD X 1 CARD TO LAB, STOOL
Fecal Occult Bld: POSITIVE — AB
Fecal Occult Bld: POSITIVE — AB

## 2021-08-28 LAB — HEMOGLOBIN AND HEMATOCRIT, BLOOD
HCT: 18.9 % — ABNORMAL LOW (ref 36.0–46.0)
HCT: 29 % — ABNORMAL LOW (ref 36.0–46.0)
Hemoglobin: 6 g/dL — CL (ref 12.0–15.0)
Hemoglobin: 9.7 g/dL — ABNORMAL LOW (ref 12.0–15.0)

## 2021-08-28 LAB — MAGNESIUM: Magnesium: 1.7 mg/dL (ref 1.7–2.4)

## 2021-08-28 LAB — C DIFFICILE QUICK SCREEN W PCR REFLEX
C Diff antigen: POSITIVE — AB
C Diff toxin: NEGATIVE

## 2021-08-28 LAB — PHOSPHORUS: Phosphorus: 4.4 mg/dL (ref 2.5–4.6)

## 2021-08-28 LAB — PROCALCITONIN: Procalcitonin: 1.12 ng/mL

## 2021-08-28 LAB — CLOSTRIDIUM DIFFICILE BY PCR, REFLEXED: Toxigenic C. Difficile by PCR: POSITIVE — AB

## 2021-08-28 LAB — PREPARE RBC (CROSSMATCH)

## 2021-08-28 MED ORDER — DEXTROSE 10 % IV SOLN: INTRAVENOUS | Status: DC

## 2021-08-28 MED ORDER — DEXTROSE 50 % IV SOLN
INTRAVENOUS | Status: AC
  Administered 2021-08-28: 12.5 g via INTRAVENOUS
  Filled 2021-08-28: qty 50

## 2021-08-28 MED ORDER — DEXTROSE 50 % IV SOLN: 12.5000 g | Freq: Once | INTRAVENOUS | Status: AC

## 2021-08-28 MED ORDER — POTASSIUM CHLORIDE 10 MEQ/100ML IV SOLN: 10.0000 meq | INTRAVENOUS | Status: DC

## 2021-08-28 MED ORDER — SODIUM CHLORIDE 0.9% IV SOLUTION: Freq: Once | INTRAVENOUS | Status: AC

## 2021-08-28 MED ORDER — VANCOMYCIN VARIABLE DOSE PER UNSTABLE RENAL FUNCTION (PHARMACIST DOSING): Status: DC

## 2021-08-28 MED ORDER — PANTOPRAZOLE SODIUM 40 MG IV SOLR
40.0000 mg | INTRAVENOUS | Status: DC
  Administered 2021-08-28 – 2021-08-29 (×2): 40 mg via INTRAVENOUS
  Filled 2021-08-28 (×2): qty 40

## 2021-08-28 MED ORDER — MAGNESIUM SULFATE 2 GM/50ML IV SOLN
2.0000 g | Freq: Once | INTRAVENOUS | Status: AC
  Administered 2021-08-28: 2 g via INTRAVENOUS
  Filled 2021-08-28: qty 50

## 2021-08-28 MED ORDER — POTASSIUM CHLORIDE 10 MEQ/100ML IV SOLN
10.0000 meq | INTRAVENOUS | Status: AC
  Administered 2021-08-28 (×2): 10 meq via INTRAVENOUS
  Filled 2021-08-28 (×2): qty 100

## 2021-08-28 NOTE — Progress Notes (Signed)
HGB 6.0 called to [place] to relay to [facility] MD

## 2021-08-28 NOTE — Progress Notes (Addendum)
eLink Physician-Brief Progress Note Patient Name: Jane [name] DOB: 1966/09/25 MRN: [id number]   Date of Service  08/28/2021  HPI/Events of Note  Hypoglycemic episodes X 2 - Blood glucose = 68 and 43. Treated with D50W X 2.   eICU Interventions  Plan: D10W IV infusion at 50 mL/hour.     Intervention Category Major Interventions: Other:  Lysle Dingwall 08/28/2021, 4:11 AM

## 2021-08-28 NOTE — Progress Notes (Signed)
NAME:  Jane [name], MRN:  [id number], DOB:  08/20/1966, LOS: 1 ADMISSION DATE:  08/26/2021, CONSULTATION DATE:  08/27/21 REFERRING MD:  Aileen Fass, CHIEF COMPLAINT:  N/V   History of Present Illness:  55 year old woman with hx of ESRD on PD, schizoaffective disorder, prior discitis/osteomyelitis/epidural abscess earlier this year presenting with N/V/D, abd pain, and cough.  Workup in ER c/w septic shock, possible aspiration PNA, shock not responding to fluids, and persistent lactic acidemia for which PCCM is consulted.  Family at bedside.    Pertinent  Medical History  ESRD on PD followed by [facility] Cognitive impairment/ mental retardation Depression/Bipolar/schizophrenia/seizures (no AEDs) HTN HLD Hypothyroidism  Significant Hospital Events: Including procedures, antibiotic start and stop dates in addition to other pertinent events   11/5 admitted sepsis    11/5 CT abd/ pelvis > 1. Bilateral lower lobe pneumonia or aspiration. 2. Small ascites. Peritoneal dialysis catheter with tip in the right lower quadrant. 3. Thickened appearance of the small bowel likely related to ascites. Enteritis is less likely. No bowel obstruction. Normal appendix. 4. Cholelithiasis. 5. Aortic Atherosclerosis  Interim History / Subjective:  Pressors not needed thus far, remains on midodrine  PD overnight  Hgb 8.8 > 6, verified w/repeat check, 2units PRBC ordered Vomited this morning, brown (no coffee grounds) -sending for Gastroccult but no obvious blood, liquid stool greenish as well, no obvious blood Refractory hypoglycemia overnight, now on D10 gtt LUE IV infiltrated  BC 2/4 MRSE, peritoneal fluid with GPCs >  Objective   Blood pressure (!) 90/55, pulse 88, temperature (!) 97.3 F (36.3 C), temperature source Oral, resp. rate (!) 28, height 5\' 4"  (1.626 m), weight 62.5 kg, SpO2 98 %.        Intake/Output Summary (Last 24 hours) at 08/28/2021 0913 Last data filed at 08/28/2021  0865 Gross per 24 hour  Intake 9539.35 ml  Output 10107 ml  Net -567.65 ml   Filed Weights   08/28/21 0700  Weight: 62.5 kg    Examination: General:  chronically ill appearing older female lying in bed in NAD HEENT: MM pale/moist, pupils 3/reactive Neuro: responds to verbal, f/c simple commands in all extremities, generalized weakness, unable to lift to gravity, incomprehensible speech, tracks  CV: rr, no murmur PULM:  non labored, coarse throughout, on 2L GI: obese, soft, bs+, mild tenderness, no distention, no large ascites, PD cath in place  Extremities: warm/dry, +2LE edema, LUE swelling Skin: no rashes  Labs reviewed: Hgb 8.8 > 6, plts 369 > 283 > 188, Na 132, bicarb 20 >21, Mag 1.7, albumin 2.4, PCT 1.62 > 1.12  Resolved Hospital Problem list   N/A  Assessment & Plan:  Septic shock, sources ddx aspiration pneumonitis, peritonitis/ SBP - not needed peripheral pressors thus far - goal MAP > 65 - continue midodrine - transfuse for Hgb < 7, no obvious signs of bleeding at this time - s/p albumin  - following BC, thus far 2/4 MRSE and peritoneal fluid with GPC, fluid c/w SBP - continue vanc, cefepime, and flagyl for now - SLP when able to rule out aspiration, remains NPO for now except meds - pending stool PCR  - cortisol 19.8, no need for stress dose steroids  ESRD on PD Hyponatremia - Nephrology following, appreciate input - PD overnight, UF 2L - strict I/Os, daily wts - serial renal indices   Anemia, normocytic/ chronic, baseline 7.5 -9  Thrombocytopenia >  - Hgb 10.3 > 8.8 > 6 (repeated for verification) >  finishing 2units PRBC now - post transfusion H/H, transfuse for Hgb < 7 - no obvious source of bleeding - pending gastro-occult and stool occult - holding empiric heparin for now  - pending smear, trend CBC - timing not c/w HIT, likely in the setting of sepsis   Hypoglycemia, refractory - likely in the setting of sepsis/ NPO - cont D10 for now, wean as  able - recheck LFTs in am, lipase was normal   Multiple cognitive and psychiatric comorbidities - Resume home psych meds- cogentin, depakote, prozac, keppra, risperdal  Hx HTN, HF - recent [facility]/06/[facility], EF 35-40%, prolonged relaxation of LV filling pattern, normal RV, trace MR - not on any home antihypertensives - monitor volume status closely, anuric/ volume removal w/PD, daily weights  Hypothyroidism  - TSH, 2.584, continue home levothyroxine   Best Practice (right click and "Reselect all SmartList Selections" daily)   Diet/type: NPO w/ oral meds DVT prophylaxis: prophylactic heparin - holding today given Hgb drop GI prophylaxis: N/A- start PPI  Lines: N/A- pta PD catheter Foley:  N/A Code Status:  full code, confirmed with family 11/5 Last date of multidisciplinary goals of care discussion.  11/5  No family currently at bedside.  Supposed to be back soon, will update then.   Labs   CBC: Recent Labs  Lab 08/26/21 2229 08/27/21 0735 08/28/21 0250 08/28/21 0435  WBC 10.1 9.5 5.0  --   NEUTROABS 8.1*  --   --   --   HGB 10.3* 8.8* 6.0* 6.0*  HCT 32.6* 26.9* 18.7* 18.9*  MCV 91.8 92.1 93.0  --   PLT 369 283 188  --     Basic Metabolic Panel: Recent Labs  Lab 08/26/21 2229 08/26/21 2348 08/27/21 0735 08/28/21 0250  NA 131*  --  132* 132*  K 4.2  --  4.8 3.7  CL 91*  --  98 99  CO2 23  --  20* 21*  GLUCOSE 84  --  46* 43*  BUN 52*  --  53* 48*  CREATININE 4.80*  --  4.65* 4.26*  CALCIUM 8.2*  --  7.5* 8.4*  MG  --  1.4*  --  1.7  PHOS  --   --   --  4.4   GFR: Estimated Creatinine Clearance: 13 mL/min (A) (by C-G formula based on SCr of 4.26 mg/dL (H)). Recent Labs  Lab 08/26/21 2229 08/26/21 2348 08/27/21 0121 08/27/21 0735 08/27/21 1109 08/28/21 0250  PROCALCITON  --   --   --  1.62  --  1.12  WBC 10.1  --   --  9.5  --  5.0  LATICACIDVEN  --  3.4* 3.0*  --  1.0  --     Liver Function Tests: Recent Labs  Lab 08/26/21 2229  08/27/21 0735 08/28/21 0250  AST 15 17 11*  ALT 10 11 8   ALKPHOS 182* 178* 100  BILITOT 0.7 0.5 0.2*  PROT 5.3* 4.6* 4.5*  ALBUMIN <1.5* <1.5* 2.4*   Recent Labs  Lab 08/26/21 2229  LIPASE 17   No results for input(s): AMMONIA in the last 168 hours.  ABG    Component Value Date/Time   PHART 7.473 (H) 01/25/2021 0830   PCO2ART 36.1 01/25/2021 0830   PO2ART 276 (H) 01/25/2021 0830   HCO3 26.7 01/25/2021 0830   TCO2 28 01/25/2021 0830   O2SAT 100.0 01/25/2021 0830     Coagulation Profile: No results for input(s): INR, PROTIME in the last 168  hours.  Cardiac Enzymes: No results for input(s): CKTOTAL, CKMB, CKMBINDEX, TROPONINI in the last 168 hours.  HbA1C: Hgb A1c MFr Bld  Date/Time Value Ref Range Status  01/25/2021 05:45 PM 5.6 4.8 - 5.6 % Final    Comment:    (NOTE) Pre diabetes:          5.7%-6.4%  Diabetes:              >6.4%  Glycemic control for   <7.0% adults with diabetes   10/08/2017 12:29 AM 4.7 (L) 4.8 - 5.6 % Final    Comment:    (NOTE)         Prediabetes: 5.7 - 6.4         Diabetes: >6.4         Glycemic control for adults with diabetes: <7.0     CBG: Recent Labs  Lab 08/27/21 1838 08/27/21 2339 08/28/21 0350 08/28/21 0811 08/28/21 0858  GLUCAP 133* 68* 112* 57* 119*    CCT:  40 mins     Kennieth Rad, ACNP [facility] 08/28/2021, 9:14 AM  See Amion for pager If no response to pager, please call PCCM consult pager After 7:00 pm call Elink

## 2021-08-28 NOTE — Progress Notes (Signed)
Blood glucose from BMET of 43. 25 ml of D50 given and elink called.

## 2021-08-28 NOTE — Progress Notes (Signed)
TRH night cross cover note:  I was contacted by inpatient pharmacist who conveyed that 1 out of 4 bottles associated with blood cultures x2 collected at time of admission are growing MRSE in this patient who is admitted for sepsis due to pneumonia and bacterial peritonitis, while noting that peritoneal fluid is also growing gram-positive cocci. inpatient pharmacist not currently recommending modifications to existing regimen of IV vancomycin, cefepime, Flagyl, per these updated culture results.     Babs Bertin, DO Hospitalist

## 2021-08-28 NOTE — Progress Notes (Signed)
[facility] Progress Note   Subjective:   Hgb dropped to 6.0 over night. RN reports dark colored emesis. Transfusion in progress this am.  Blood and Fluid cultures +GPC Completed PD net UF 2L. She endorses abd pain this am.   Objective Vitals:   08/28/21 0648 08/28/21 0700 08/28/21 0905 08/28/21 0920  BP:  103/64 (!) 90/55 (!) 90/29  Pulse: 94 96 88 86  Resp: (!) 22 (!) 22 (!) 28 (!) 25  Temp: 98.4 F (36.9 C) (!) 97.5 F (36.4 C) (!) 97.3 F (36.3 C) (!) 97.5 F (36.4 C)  TempSrc: Oral Oral Oral Oral  SpO2: 97% 92% 98% 97%  Weight:  62.5 kg    Height:  5\' 4"  (1.626 m)       Additional Objective Labs: Basic Metabolic Panel: Recent Labs  Lab 08/26/21 2229 08/27/21 0735 08/28/21 0250  NA 131* 132* 132*  K 4.2 4.8 3.7  CL 91* 98 99  CO2 23 20* 21*  GLUCOSE 84 46* 43*  BUN 52* 53* 48*  CREATININE 4.80* 4.65* 4.26*  CALCIUM 8.2* 7.5* 8.4*  PHOS  --   --  4.4   CBC: Recent Labs  Lab 08/26/21 2229 08/27/21 0735 08/28/21 0250 08/28/21 0435  WBC 10.1 9.5 5.0  --   NEUTROABS 8.1*  --   --   --   HGB 10.3* 8.8* 6.0* 6.0*  HCT 32.6* 26.9* 18.7* 18.9*  MCV 91.8 92.1 93.0  --   PLT 369 283 188  --    Blood Culture    Component Value Date/Time   SDES PERITONEAL FLUID 08/27/2021 1416   SPECREQUEST NONE 08/27/2021 1416   CULT  08/27/2021 1416    CULTURE REINCUBATED FOR BETTER GROWTH Performed at [facility] [address], [place], [place] [address]    REPTSTATUS PENDING 08/27/2021 1416     Physical Exam General: Alert, lying on side, nad  Heart: RRR No m,r,g Lungs: Clear bilaterally  Abdomen: soft, mild LQ tenderness  Extremities: 1+ LE edema bilaterally Dialysis Access: PD cath in place   Medications:  sodium chloride Stopped (08/28/21 0522)   ceFEPime (MAXIPIME) IV     dextrose 50 mL/hr at 08/28/21 0917   dialysis solution 1.5% low-MG/low-CA     metronidazole Stopped (08/28/21 0445)   norepinephrine (LEVOPHED) Adult  infusion Stopped (08/27/21 0953)    benztropine  1 mg Oral QHS   Chlorhexidine Gluconate Cloth  6 each Topical Daily   divalproex  500 mg Oral BID   FLUoxetine  20 mg Oral Daily   gentamicin cream  1 application Topical Daily   levETIRAcetam  500 mg Oral BID   levothyroxine  50 mcg Oral Q breakfast   midodrine  5 mg Oral TID WC   risperiDONE  3 mg Oral QHS   sodium chloride flush  3 mL Intravenous Q12H   vancomycin variable dose per unstable renal function (pharmacist dosing)   Does not apply See admin instructions    Dialysis Orders:  [facility] [phone] (HT not open on weekends)  OP Nephrologist: Dimas Chyle  Ambulatory PD  7x/week  4 exchanges 2L    Assessment/Plan: Sepsis --Secondary to  aspiration PNA and peritonitis. 1/2 Blood cx +MRSE. Fluid cx +GPC.   On Vanc/cefepime/Flagyl. Per primary.  Hypotension/volume  - Hypotensive 2/2 shock. S/p 3.5L IVF bolus, albumin. Pressor support on admission. Midodrine started. ESRD -  Will continue CCPD All 1.5% bags for now.  Anemia  -  Hgb 8.8 >6.0. Transfusing PRBCs this am. ?GI bleed. Hemoccult pending.  Will get OP records for meds.   Metabolic bone disease -  Corr Ca/Phos ok.  Nutrition - Alb low.  Prot supp when taking PO Schizoaffective/Bipolar disorder - per primary   Jane Child PA-C [facility] 08/28/2021,9:34 AM

## 2021-08-28 NOTE — Progress Notes (Signed)
PHARMACY - PHYSICIAN COMMUNICATION CRITICAL VALUE ALERT - BLOOD CULTURE IDENTIFICATION (BCID)  Jane [name] is an 55 y.o. female who presented to [facility] on 08/26/2021 with a chief complaint of sepsis, poss PNA/peritonitis  Assessment:   1/2 blood cultures growing MRSE.  Awaiting peritoneal fluid cultures.  Name of physician (or Provider) Contacted:  Dr. Velia Meyer  Current antibiotics: Vancomycin and Cefepime and Flagyl   Changes to prescribed antibiotics recommended:  No changes at this time.   Results for orders placed or performed during the hospital encounter of 08/26/21  Blood Culture ID Panel (Reflexed) (Collected: 08/27/2021  2:00 AM)  Result Value Ref Range   Enterococcus faecalis NOT DETECTED NOT DETECTED   Enterococcus Faecium NOT DETECTED NOT DETECTED   Listeria monocytogenes NOT DETECTED NOT DETECTED   Staphylococcus species DETECTED (A) NOT DETECTED   Staphylococcus aureus (BCID) NOT DETECTED NOT DETECTED   Staphylococcus epidermidis DETECTED (A) NOT DETECTED   Staphylococcus lugdunensis NOT DETECTED NOT DETECTED   Streptococcus species NOT DETECTED NOT DETECTED   Streptococcus agalactiae NOT DETECTED NOT DETECTED   Streptococcus pneumoniae NOT DETECTED NOT DETECTED   Streptococcus pyogenes NOT DETECTED NOT DETECTED   A.calcoaceticus-baumannii NOT DETECTED NOT DETECTED   Bacteroides fragilis NOT DETECTED NOT DETECTED   Enterobacterales NOT DETECTED NOT DETECTED   Enterobacter cloacae complex NOT DETECTED NOT DETECTED   Escherichia coli NOT DETECTED NOT DETECTED   Klebsiella aerogenes NOT DETECTED NOT DETECTED   Klebsiella oxytoca NOT DETECTED NOT DETECTED   Klebsiella pneumoniae NOT DETECTED NOT DETECTED   Proteus species NOT DETECTED NOT DETECTED   Salmonella species NOT DETECTED NOT DETECTED   Serratia marcescens NOT DETECTED NOT DETECTED   Haemophilus influenzae NOT DETECTED NOT DETECTED   Neisseria meningitidis NOT DETECTED NOT DETECTED   Pseudomonas  aeruginosa NOT DETECTED NOT DETECTED   Stenotrophomonas maltophilia NOT DETECTED NOT DETECTED   Candida albicans NOT DETECTED NOT DETECTED   Candida auris NOT DETECTED NOT DETECTED   Candida glabrata NOT DETECTED NOT DETECTED   Candida krusei NOT DETECTED NOT DETECTED   Candida parapsilosis NOT DETECTED NOT DETECTED   Candida tropicalis NOT DETECTED NOT DETECTED   Cryptococcus neoformans/gattii NOT DETECTED NOT DETECTED   Methicillin resistance mecA/C DETECTED (A) NOT DETECTED    Caryl Pina 08/28/2021  1:25 AM

## 2021-08-28 NOTE — Progress Notes (Signed)
eLink Physician-Brief Progress Note Patient Name: Jane [name] DOB: [birth year] MRN: [id number]   Date of Service  08/28/2021  HPI/Events of Note  Anemia - Hgb = 8.8 --> 6.0.   eICU Interventions  Plan: Repeat H/H STAT to verify unusually large drop in Hgb. Type and Screen STAT.     Intervention Category Major Interventions: Other:  Lysle Dingwall 08/28/2021, 3:38 AM

## 2021-08-28 NOTE — Progress Notes (Signed)
[facility] Progress Note Patient Name: Jane [name] DOB: April 16, 1966 MRN: [id number]   Date of Service  08/28/2021  HPI/Events of Note  Anemia - repeat Hgb = 6.0.   eICU Interventions  Will transfuse 2 units PRBC.     Intervention Category Major Interventions: Other:  Lysle Dingwall 08/28/2021, 5:42 AM

## 2021-08-28 NOTE — Progress Notes (Signed)
SLP Cancellation Note  Patient Details Name: Jane [name] MRN: [id number] DOB: 1966/06/09   Cancelled treatment:       Reason Eval/Treat Not Completed: Patient not medically ready Per RN, pt with emesis this morning; requesting hold CSE.  Will continue efforts when pt is appropriate.  Zoltan Genest P. Yang Rack, M.S., [place] Pathologist Acute Rehabilitation Services Pager: [place] 08/28/2021, 8:32 AM

## 2021-08-29 DIAGNOSIS — B957 Other staphylococcus as the cause of diseases classified elsewhere: Secondary | ICD-10-CM

## 2021-08-29 DIAGNOSIS — R7881 Bacteremia: Secondary | ICD-10-CM

## 2021-08-29 DIAGNOSIS — A419 Sepsis, unspecified organism: Secondary | ICD-10-CM | POA: Diagnosis not present

## 2021-08-29 DIAGNOSIS — J690 Pneumonitis due to inhalation of food and vomit: Secondary | ICD-10-CM

## 2021-08-29 DIAGNOSIS — T8571XA Infection and inflammatory reaction due to peritoneal dialysis catheter, initial encounter: Secondary | ICD-10-CM | POA: Diagnosis present

## 2021-08-29 LAB — CBC
HCT: 29.6 % — ABNORMAL LOW (ref 36.0–46.0)
Hemoglobin: 9.9 g/dL — ABNORMAL LOW (ref 12.0–15.0)
MCH: 28 pg (ref 26.0–34.0)
MCHC: 33.4 g/dL (ref 30.0–36.0)
MCV: 83.9 fL (ref 80.0–100.0)
Platelets: 191 10*3/uL (ref 150–400)
RBC: 3.53 MIL/uL — ABNORMAL LOW (ref 3.87–5.11)
RDW: 23.8 % — ABNORMAL HIGH (ref 11.5–15.5)
WBC: 6.8 10*3/uL (ref 4.0–10.5)
nRBC: 0 % (ref 0.0–0.2)

## 2021-08-29 LAB — TYPE AND SCREEN
ABO/RH(D): O POS
Antibody Screen: NEGATIVE
Unit division: 0
Unit division: 0

## 2021-08-29 LAB — BPAM RBC
Blood Product Expiration Date: 202211282359
Blood Product Expiration Date: 202211292359
ISSUE DATE / TIME: 202211060604
ISSUE DATE / TIME: 202211060606
Unit Type and Rh: 5100
Unit Type and Rh: 5100

## 2021-08-29 LAB — BASIC METABOLIC PANEL
Anion gap: 16 — ABNORMAL HIGH (ref 5–15)
BUN: 43 mg/dL — ABNORMAL HIGH (ref 6–20)
CO2: 17 mmol/L — ABNORMAL LOW (ref 22–32)
Calcium: 8.3 mg/dL — ABNORMAL LOW (ref 8.9–10.3)
Chloride: 96 mmol/L — ABNORMAL LOW (ref 98–111)
Creatinine, Ser: 4.14 mg/dL — ABNORMAL HIGH (ref 0.44–1.00)
GFR, Estimated: 12 mL/min — ABNORMAL LOW (ref >60)
Glucose, Bld: 108 mg/dL — ABNORMAL HIGH (ref 70–99)
Potassium: 3.1 mmol/L — ABNORMAL LOW (ref 3.5–5.1)
Sodium: 129 mmol/L — ABNORMAL LOW (ref 135–145)

## 2021-08-29 LAB — GLUCOSE, CAPILLARY
Glucose-Capillary: 128 mg/dL — ABNORMAL HIGH (ref 70–99)
Glucose-Capillary: 136 mg/dL — ABNORMAL HIGH (ref 70–99)
Glucose-Capillary: 68 mg/dL — ABNORMAL LOW (ref 70–99)
Glucose-Capillary: 71 mg/dL (ref 70–99)
Glucose-Capillary: 92 mg/dL (ref 70–99)
Glucose-Capillary: 92 mg/dL (ref 70–99)

## 2021-08-29 LAB — MAGNESIUM: Magnesium: 1.9 mg/dL (ref 1.7–2.4)

## 2021-08-29 LAB — PROCALCITONIN: Procalcitonin: 1.34 ng/mL

## 2021-08-29 LAB — MRSA NEXT GEN BY PCR, NASAL: MRSA by PCR Next Gen: NOT DETECTED

## 2021-08-29 LAB — PHOSPHORUS: Phosphorus: 3.8 mg/dL (ref 2.5–4.6)

## 2021-08-29 MED ORDER — SODIUM CHLORIDE 0.9 % IV SOLN
3.0000 g | INTRAVENOUS | Status: AC
  Administered 2021-08-29 – 2021-09-02 (×5): 3 g via INTRAVENOUS
  Filled 2021-08-29 (×5): qty 8

## 2021-08-29 MED ORDER — DEXTROSE 50 % IV SOLN
INTRAVENOUS | Status: AC
  Administered 2021-08-29: 25 mL
  Filled 2021-08-29: qty 50

## 2021-08-29 MED ORDER — DELFLEX-LC/1.5% DEXTROSE 344 MOSM/L IP SOLN
INTRAPERITONEAL | Status: DC
  Administered 2021-08-29: 2000 mL via INTRAPERITONEAL
  Administered 2021-08-30: 4000 mL via INTRAPERITONEAL

## 2021-08-29 MED ORDER — DEXTROSE 50 % IV SOLN: 12.5000 g | INTRAVENOUS | Status: AC

## 2021-08-29 MED ORDER — POTASSIUM CHLORIDE 10 MEQ/100ML IV SOLN
10.0000 meq | INTRAVENOUS | Status: AC
  Administered 2021-08-29 (×4): 10 meq via INTRAVENOUS
  Filled 2021-08-29 (×4): qty 100

## 2021-08-29 MED ORDER — DELFLEX-LC/2.5% DEXTROSE 394 MOSM/L IP SOLN
INTRAPERITONEAL | Status: DC
  Administered 2021-08-29: 2000 mL via INTRAPERITONEAL
  Administered 2021-08-30: 4000 mL via INTRAPERITONEAL

## 2021-08-29 MED ORDER — LEVETIRACETAM 500 MG PO TABS
500.0000 mg | ORAL_TABLET | Freq: Every day | ORAL | Status: DC
  Administered 2021-08-30 – 2021-08-31 (×2): 500 mg via ORAL
  Filled 2021-08-29 (×2): qty 1

## 2021-08-29 NOTE — Progress Notes (Signed)
NAME:  Jane [name], MRN:  [id number], DOB:  [birth year], LOS: 2 ADMISSION DATE:  08/26/2021, CONSULTATION DATE:  08/27/21 REFERRING MD:  Aileen Fass, CHIEF COMPLAINT:  N/V   History of Present Illness:  55 year old woman with hx of ESRD on PD, schizoaffective disorder, prior discitis/osteomyelitis/epidural abscess earlier this year presenting with N/V/D, abd pain, and cough.  Workup in ER c/w septic shock, possible aspiration PNA, shock not responding to fluids, and persistent lactic acidemia for which PCCM is consulted.  Family at bedside.    Pertinent  Medical History  ESRD on PD followed by [facility] Cognitive impairment/ mental retardation Depression/Bipolar/schizophrenia/seizures (no AEDs) HTN HLD Hypothyroidism  Significant Hospital Events: Including procedures, antibiotic start and stop dates in addition to other pertinent events   11/5 admitted sepsis    11/5 CT abd/ pelvis > 1. Bilateral lower lobe pneumonia or aspiration. 2. Small ascites. Peritoneal dialysis catheter with tip in the right lower quadrant. 3. Thickened appearance of the small bowel likely related to ascites. Enteritis is less likely. No bowel obstruction. Normal appendix. 4. Cholelithiasis. 5. Aortic Atherosclerosis  Interim History / Subjective:  Tolerating midodrine Episode of vomiting 08/28/2021 On D10 for hypoglycemia Peritoneal fluid with MRSE  Objective   Blood pressure (!) 97/32, pulse 84, temperature 97.7 F (36.5 C), temperature source Oral, resp. rate (!) 25, height 5\' 4"  (1.626 m), weight 62.5 kg, SpO2 97 %.        Intake/Output Summary (Last 24 hours) at 08/29/2021 1011 Last data filed at 08/29/2021 1000 Gross per 24 hour  Intake 9692.74 ml  Output 8155 ml  Net 1537.74 ml   Filed Weights   08/28/21 0700  Weight: 62.5 kg    Examination: General: Chronically ill-appearing  HEENT: Moist oral mucosa, pupils reactive Neuro: Follows commands  CV: rr, no murmur PULM: Coarse  breath sounds GI: obese, soft, bs+, mild tenderness, no distention, no large ascites, PD cath in place  Extremities: Lower extremity edema Skin: no rashes  Labs reviewed: H/H noted  Resolved Hospital Problem list   N/A  Assessment & Plan:   Septic shock Aspiration pneumonitis Peritonitis/SBP -We will continue midodrine to support blood pressure -Consult ID -Follow stool PCR  End-stage renal disease on peritoneal dialysis -Appreciate nephrology input -Continue PD  Anemia, thrombocytopenia -Likely related to sepsis -No obvious source of bleeding -Continue to monitor  Refractory hypoglycemia -Continue D10 for now -May be related to sepsis  History of heart failure -Continue to monitor -Volume removal with peritoneal dialysis  Multiple cognitive and psychiatric comorbidities -Multiple home medications been continued  Hypothyroidism -Continue home medication  Best Practice (right click and "Reselect all SmartList Selections" daily)   Diet/type: NPO w/ oral meds DVT prophylaxis: prophylactic heparin - holding today given Hgb drop GI prophylaxis: N/A- start PPI  Lines: N/A- pta PD catheter Foley:  N/A Code Status:  full code, confirmed with family 11/5 Last date of multidisciplinary goals of care discussion.  11/5  No family at bedside Caregiver was in early this a.m.  Labs   CBC: Recent Labs  Lab 08/26/21 2229 08/27/21 0735 08/28/21 0250 08/28/21 0435 08/28/21 1251 08/29/21 0245  WBC 10.1 9.5 5.0  --   --  6.8  NEUTROABS 8.1*  --   --   --   --   --   HGB 10.3* 8.8* 6.0* 6.0* 9.7* 9.9*  HCT 32.6* 26.9* 18.7* 18.9* 29.0* 29.6*  MCV 91.8 92.1 93.0  --   --  83.9  PLT 369 283 188  --   --  323    Basic Metabolic Panel: Recent Labs  Lab 08/26/21 2229 08/26/21 2348 08/27/21 0735 08/28/21 0250 08/29/21 0245  NA 131*  --  132* 132* 129*  K 4.2  --  4.8 3.7 3.1*  CL 91*  --  98 99 96*  CO2 23  --  20* 21* 17*  GLUCOSE 84  --  46* 43* 108*  BUN  52*  --  53* 48* 43*  CREATININE 4.80*  --  4.65* 4.26* 4.14*  CALCIUM 8.2*  --  7.5* 8.4* 8.3*  MG  --  1.4*  --  1.7 1.9  PHOS  --   --   --  4.4 3.8   GFR: Estimated Creatinine Clearance: 13.4 mL/min (A) (by C-G formula based on SCr of 4.14 mg/dL (H)). Recent Labs  Lab 08/26/21 2229 08/26/21 2348 08/27/21 0121 08/27/21 0735 08/27/21 1109 08/28/21 0250 08/29/21 0245  PROCALCITON  --   --   --  1.62  --  1.12 1.34  WBC 10.1  --   --  9.5  --  5.0 6.8  LATICACIDVEN  --  3.4* 3.0*  --  1.0  --   --     Liver Function Tests: Recent Labs  Lab 08/26/21 2229 08/27/21 0735 08/28/21 0250  AST 15 17 11*  ALT 10 11 8   ALKPHOS 182* 178* 100  BILITOT 0.7 0.5 0.2*  PROT 5.3* 4.6* 4.5*  ALBUMIN <1.5* <1.5* 2.4*   Recent Labs  Lab 08/26/21 2229  LIPASE 17   No results for input(s): AMMONIA in the last 168 hours.  ABG    Component Value Date/Time   PHART 7.473 (H) 01/25/2021 0830   PCO2ART 36.1 01/25/2021 0830   PO2ART 276 (H) 01/25/2021 0830   HCO3 26.7 01/25/2021 0830   TCO2 28 01/25/2021 0830   O2SAT 100.0 01/25/2021 0830     Coagulation Profile: No results for input(s): INR, PROTIME in the last 168 hours.  Cardiac Enzymes: No results for input(s): CKTOTAL, CKMB, CKMBINDEX, TROPONINI in the last 168 hours.  HbA1C: Hgb A1c MFr Bld  Date/Time Value Ref Range Status  01/25/2021 05:45 PM 5.6 4.8 - 5.6 % Final    Comment:    (NOTE) Pre diabetes:          5.7%-6.4%  Diabetes:              >6.4%  Glycemic control for   <7.0% adults with diabetes   10/08/2017 12:29 AM 4.7 (L) 4.8 - 5.6 % Final    Comment:    (NOTE)         Prediabetes: 5.7 - 6.4         Diabetes: >6.4         Glycemic control for adults with diabetes: <7.0     CBG: Recent Labs  Lab 08/28/21 1629 08/28/21 2136 08/28/21 2316 08/29/21 0721 08/29/21 0750  GLUCAP 110* 112* 121* 68* 128*   The patient is critically ill with multiple organ systems failure and requires high complexity  decision making for assessment and support, frequent evaluation and titration of therapies, application of advanced monitoring technologies and extensive interpretation of multiple databases. Critical Care Time devoted to patient care services described in this note independent of APP/resident time (if applicable)  is 35 minutes.   Sherrilyn Rist MD [place] [facility] Personal pager: See Amion If unanswered, please page CCM On-call: [phone]

## 2021-08-29 NOTE — Progress Notes (Signed)
[facility] Progress Note   Subjective:   Hb improved from 6s to 9s after 2u pRBC Blood and Fluid cultures +GPC --> staph epi.  ID consulting Completed PD net UF 0.2L.  On D10 at 50/hr for hypoglycemia. C/o abd pain and weakness, poor appetite.  Tolerated PD ok last PM.   Objective Vitals:   08/29/21 0500 08/29/21 0600 08/29/21 0724 08/29/21 0730  BP: (!) 82/41 (!) 78/59  (!) 82/41  Pulse: 91 93  96  Resp: (!) 22 13  (!) 21  Temp:   97.7 F (36.5 C)   TempSrc:   Oral   SpO2: 94% 94%  92%  Weight:      Height:         Additional Objective Labs: Basic Metabolic Panel: Recent Labs  Lab 08/27/21 0735 08/28/21 0250 08/29/21 0245  NA 132* 132* 129*  K 4.8 3.7 3.1*  CL 98 99 96*  CO2 20* 21* 17*  GLUCOSE 46* 43* 108*  BUN 53* 48* 43*  CREATININE 4.65* 4.26* 4.14*  CALCIUM 7.5* 8.4* 8.3*  PHOS  --  4.4 3.8    CBC: Recent Labs  Lab 08/26/21 2229 08/27/21 0735 08/28/21 0250 08/28/21 0435 08/28/21 1251 08/29/21 0245  WBC 10.1 9.5 5.0  --   --  6.8  NEUTROABS 8.1*  --   --   --   --   --   HGB 10.3* 8.8* 6.0* 6.0* 9.7* 9.9*  HCT 32.6* 26.9* 18.7* 18.9* 29.0* 29.6*  MCV 91.8 92.1 93.0  --   --  83.9  PLT 369 283 188  --   --  191    Blood Culture    Component Value Date/Time   SDES PERITONEAL FLUID 08/27/2021 1416   SPECREQUEST NONE 08/27/2021 1416   CULT (A) 08/27/2021 1416    STAPHYLOCOCCUS EPIDERMIDIS CULTURE REINCUBATED FOR BETTER GROWTH Performed at [facility], [place] [address] [place], [place] [address]    REPTSTATUS PENDING 08/27/2021 1416     Physical Exam General: Alert, lying on side, nad  Heart: RRR No m,r,g Lungs: Clear bilaterally  Abdomen: soft, mild LQ tenderness  Extremities: 1+ LE edema bilaterally Dialysis Access: PD cath in place   Medications:  sodium chloride Stopped (08/28/21 0522)   ceFEPime (MAXIPIME) IV Stopped (08/28/21 2152)   dextrose 50 mL/hr at 08/29/21 0700   dialysis solution 1.5%  low-MG/low-CA     metronidazole Stopped (08/29/21 0546)    benztropine  1 mg Oral QHS   Chlorhexidine Gluconate Cloth  6 each Topical Daily   dextrose  12.5 g Intravenous STAT   divalproex  500 mg Oral BID   FLUoxetine  20 mg Oral Daily   gentamicin cream  1 application Topical Daily   levETIRAcetam  500 mg Oral BID   levothyroxine  50 mcg Oral Q breakfast   midodrine  5 mg Oral TID WC   pantoprazole (PROTONIX) IV  40 mg Intravenous Q24H   risperiDONE  3 mg Oral QHS   sodium chloride flush  3 mL Intravenous Q12H   vancomycin variable dose per unstable renal function (pharmacist dosing)   Does not apply See admin instructions    Dialysis Orders:  [facility] [phone] (HT not open on weekends)  OP Nephrologist: Dimas Chyle  Ambulatory PD  7x/week  4 exchanges 2L    Assessment/Plan: Sepsis --Secondary to  aspiration PNA and peritonitis. 1/2 Blood cx +MRSE. Fluid cx +GPC.   On Vanc/cefepime/Flagyl. Per  primary and ID consult now.  Repeat cell count today to ensure improving.  Hypotension/volume  - Hypotensive 2/2 shock. S/p volume resuscitation. Pressor support on admission. Midodrine started.  BPs remain marginal.  ESRD -  Will continue CCPD tonight - use combo 1.5%/2.5% due to hyponatremia from D10 gtt.   Anemia  - Hgb 8.8 >6.0 > 9s. Transfusing PRBCs this am. ?GI bleed. Hemoccult pending.  Metabolic bone disease -  Corr Ca/Phos ok.  Nutrition - Alb low.  Prot supp when taking PO Schizoaffective/Bipolar disorder - per primary  Hypokalemia - replete.  Hyponatremia - secondary to infusion of hypotonic fluids with D10 drip.  Minimize as able but will aim for UF tonight with half 2.5% dialysate to help free water removal.   Jannifer Hick MD [facility] Pager [phone]

## 2021-08-29 NOTE — Evaluation (Addendum)
Clinical/Bedside Swallow Evaluation Patient Details  Name: Jane [name] MRN: [id number] Date of Birth: 1966/04/05  Today's Date: 08/29/2021 Time: SLP Start Time (ACUTE ONLY): 2025 SLP Stop Time (ACUTE ONLY): 4270 SLP Time Calculation (min) (ACUTE ONLY): 19 min  Past Medical History:  Past Medical History:  Diagnosis Date   Abnormal gait    Anemia    Bipolar disorder (HCC)    Depression    ESRD on peritoneal dialysis ([place])    ESRD on peritoneal dialysis ([name])    GERD (gastroesophageal reflux disease)    Hyperlipemia    Hypertension    Hypothyroidism    Moderately mentally retarded    Schizophrenia ([place])    Seizures ([place])    No recent seizures - no meds   Past Surgical History:  Past Surgical History:  Procedure Laterality Date   BREAST REDUCTION SURGERY     BREAST SURGERY     breast reduction   EXAMINATION UNDER ANESTHESIA N/A 12/08/2013   Procedure: EXAM UNDER ANESTHESIA;  Surgeon: Lavonia Drafts, MD;  Location: [facility];  Service: Gynecology;  Laterality: N/A;  Pelvic exam and pap smear, unable to tolerate during last office visit   FOOT SURGERY     right   IR FLUORO GUIDE CV LINE RIGHT  02/01/2021   IR REMOVAL TUN CV CATH W/O FL  03/15/2021   IR US GUIDE VASC ACCESS RIGHT  02/01/2021   LUMBAR LAMINECTOMY/DECOMPRESSION MICRODISCECTOMY N/A 01/25/2021   Procedure: Lumbar Five -Sacral One Laminectomy for Epidural Abscess;  Surgeon: Earnie Larsson, MD;  Location: [place] [facility];  Service: Neurosurgery;  Laterality: N/A;   REDUCTION MAMMAPLASTY Bilateral    HPI:  Jane [name] is a 55 y.o. female who presented to the ED with a week of nausea, vomiting, diarrhea, and productive cough. Workup in ED c/w septic shock, possible aspiration PNA. PMH: schizoaffective disorder, seizures, ESRD on peritoneal dialysis, chronic systolic CHF, hypothyroidism,  intellectual disability, and discitis-osteomyelitis and epidural abscess in April 2022. BSE 02/04/21: limited due to Jane [name]'s cooperation and lethargy, but  no s/sx of aspiration noted with boluses of thin liquid of pudding.    Assessment / Plan / Recommendation  Clinical Impression  Jane [name] was seen for bedside swallow evaluation. She reported that she coughs with intake of solids, but her reliability is questioned; no symptoms of oropharyngeal dysphagia were reported to SLP during the April, 2022 admission and she was on a regular texture diet at that time. Attempts were made to reach the Jane [name]'s caregiver, but these were unsuccessful. Oral mechanism exam was limited due to Jane [name]'s difficulty following some commands; however, oral motor strength and ROM appeared grossly WFL. She presented with adequate, natural dentition. She tolerated individual swallows of thin liquids via straw and a single bolus of regular texture solids without overt s/sx of aspiration. Mastication was [facility] and oral clearance was only mildly impaired. Jane [name] demonstrating loud wailing-type vocalizations when consecutive swallows were attempted or when additional boluses of solids were attempted. Jane [name] may have up to dysphagia 2 solids and thin liquids. However, considering her current N/V, SLP will defer diet advancement to MD. SLP Visit Diagnosis: Dysphagia, unspecified (R13.10)    Aspiration Risk  Mild aspiration risk    Diet Recommendation Dysphagia 2 (Fine chop);Thin liquid (Up to dysphagia 2; diet advancement deferred to MD)   Liquid Administration via: Cup;Straw Medication Administration: Whole meds with liquid Compensations: Minimize environmental distractions Postural Changes: Seated upright at 90 degrees    Other  Recommendations Oral Care Recommendations: Oral care BID  Recommendations for follow up therapy are one component of a multi-disciplinary discharge planning process, led by the attending physician.  Recommendations may be updated based on patient status, additional functional criteria and insurance authorization.  Follow up Recommendations  (TBD)      Frequency and  Duration min 2x/week  2 weeks       Prognosis Prognosis for Safe Diet Advancement: Good Barriers to Reach Goals: Cognitive deficits      Swallow Study   General Date of Onset: 08/28/21 HPI: Jane [name] is a 55 y.o. female who presented to the ED with a week of nausea, vomiting, diarrhea, and productive cough. Workup in ED c/w septic shock, possible aspiration PNA. PMH: schizoaffective disorder, seizures, ESRD on peritoneal dialysis, chronic systolic CHF, hypothyroidism,  intellectual disability, and discitis-osteomyelitis and epidural abscess in April 2022. BSE 02/04/21: limited due to Jane [name]'s cooperation and lethargy, but no s/sx of aspiration noted with boluses of thin liquid of pudding. Type of Study: Bedside Swallow Evaluation Previous Swallow Assessment: See HPI Diet Prior to this Study: NPO Temperature Spikes Noted: No Respiratory Status: Nasal cannula History of Recent Intubation: No Behavior/Cognition: Alert;Cooperative;Pleasant mood Oral Cavity Assessment: Within Functional Limits Oral Care Completed by SLP: No Oral Cavity - Dentition: Adequate natural dentition Vision: Functional for self-feeding Self-Feeding Abilities: Total assist Patient Positioning: Upright in bed;Postural control adequate for testing Baseline Vocal Quality: Low vocal intensity Volitional Cough: Cognitively unable to elicit Volitional Swallow: Unable to elicit    Oral/Motor/Sensory Function Overall Oral Motor/Sensory Function: Within functional limits (limited)   Ice Chips Ice chips: Not tested   Thin Liquid Thin Liquid: Within functional limits Presentation: Straw    Nectar Thick Nectar Thick Liquid: Not tested   Honey Thick Honey Thick Liquid: Not tested   Puree Puree: Not tested (refused)   Solid     Solid: Within functional limits Other Comments:  (mild oral residue)     Kahil Agner I. Hardin Negus, [place], [place] Office number [phone] Pager [phone]   Horton Marshall 08/29/2021,9:13 AM

## 2021-08-29 NOTE — Consult Note (Addendum)
[facility]                                                                                        Patient Identification: Patient Name: Jane [name] MRN: [id number] [place] Date: 08/26/2021 10:15 PM Today's Date: 08/29/2021 Reason for consult:  Requesting provider:   Principal Problem:   Sepsis [facility]) Active Problems:   Schizoaffective disorder ([place])   End-stage renal disease on peritoneal dialysis [facility])   Seizure disorder ([place])   Aspiration pneumonia of both lower lobes due to gastric secretions ([place])   Septic shock ([place])   Antibiotics: Cefepime 11/4, 11/6                    Metronidazole 11/4-current                       Lines/Hardware: PD catheter  Assessment SBP ( Staph epidermidis) in the setting of ESRD on PD ? Aspiration PNA MRSE in blood cultures ( Staph epidermidis): could be a contaminant vs same organism from Peritoneal fluid going to blood  H/o discitis and epidural abscess s/p Laminectomy in 01/25/21 ( [facility]) and completion of 8 weeks tx (IV and Po)  Anemia - getting transfused, FOBT + Hyponatremia/hypokalemia - repleted, nephrology following  Hypotension - on midodrine  Schizoaffective d/o and bipolar d/o    Recommendations  -Continue Vancomycin, pharmacy to dose.  -Can consolidate cefepime/metronidazole to [facility] for better strep coverage for aspiration PNA -Fu sensitivities of Staph epidermidis from both peritoneal fluid and blood cx to check for concordance. Will repeat blood cx today ( ordered ) - Agree with Nephrology to repeat PF analysis today to assess tx reponse -? Any plans for switching from PD to HD  - I was unable to examine her PD catheter site  today as she would get into repeated spells of crying every time I tried to examine her PD catheter site. Apparently, her RN tells me she has been doing like this for her as well. Will need to re-examine the PD  site for any exit site/tunnel infections in which case will need to consider about removing PD catheter - Monitor CBC, BMP and Vancomycin trough  - Discussed with Dr Olalere/nephrology  -Following   Rest of the management as per the primary team. Please call with questions or concerns.  Thank you for the consult  Rosiland Oz, MD Infectious Disease Physician [facility] [address] [place],  [address] Phone: [phone]  Fax: [phone]  __________________________________________________________________________________________________________ HPI and Hospital Course: 55 Y O female with PMH of Schizoaffective d/o, Bipolar d/o, Seizures, ESRD on PD, CHF, h/o previous discitis and OM , epidural abscess s/p laminectomy (01/25/21) and prolonged abtx tx , hypothyroidism who presented to the ED on 11/4 with 1 week of cough, nausea, vomiting, abdominal pain and diarrhea. She has not ambulated after her back surgery in 01/2021. She started crying as soon as I entered the room and started saying things which is not understandable. I later spoke with her RN who says she has been behaving  the same way with her as well. It is difficult for her to understand what she is saying but she does follow commands. She earlier stated to speech therapist that her stomach hurts when she tries to eat. One episode of loose stool today per RN. I could not get history from her.   AT ED, hypothermic, tachypneic, tachycardic, hypotensive Unremarkable CXR CT abdomen/pelvis small ascites, non specific bowel wall thickening, bilateral pna and aspiration  Blood cx 08/27/21 1/4 bottles staph epidermidis  Peritoneal fluid 08/27/21  WBC 1099, N 84% c/w SB, CX staph epidermidis  ROS: unable to obtain at this time.   Past Medical History:  Diagnosis Date   Abnormal gait    Anemia    Bipolar disorder (HCC)    Depression    ESRD on peritoneal dialysis ([place])     ESRD on peritoneal dialysis ([place])    GERD (gastroesophageal reflux disease)    Hyperlipemia    Hypertension    Hypothyroidism    Moderately mentally retarded    Schizophrenia ([place])    Seizures ([place])    No recent seizures - no meds   Past Surgical History:  Procedure Laterality Date   BREAST REDUCTION SURGERY     BREAST SURGERY     breast reduction   EXAMINATION UNDER ANESTHESIA N/A 12/08/2013   Procedure: EXAM UNDER ANESTHESIA;  Surgeon: Lavonia Drafts, MD;  Location: [facility];  Service: Gynecology;  Laterality: N/A;  Pelvic exam and pap smear, unable to tolerate during last office visit   FOOT SURGERY     right   IR FLUORO GUIDE CV LINE RIGHT  02/01/2021   IR REMOVAL TUN CV CATH W/O FL  03/15/2021   IR US GUIDE VASC ACCESS RIGHT  02/01/2021   LUMBAR LAMINECTOMY/DECOMPRESSION MICRODISCECTOMY N/A 01/25/2021   Procedure: Lumbar Five -Sacral One Laminectomy for Epidural Abscess;  Surgeon: Earnie Larsson, MD;  Location: [facility];  Service: Neurosurgery;  Laterality: N/A;   REDUCTION MAMMAPLASTY Bilateral      Scheduled Meds:  benztropine  1 mg Oral QHS   Chlorhexidine Gluconate Cloth  6 each Topical Daily   dextrose  12.5 g Intravenous STAT   divalproex  500 mg Oral BID   FLUoxetine  20 mg Oral Daily   gentamicin cream  1 application Topical Daily   levETIRAcetam  500 mg Oral BID   levothyroxine  50 mcg Oral Q breakfast   midodrine  5 mg Oral TID WC   pantoprazole (PROTONIX) IV  40 mg Intravenous Q24H   risperiDONE  3 mg Oral QHS   sodium chloride flush  3 mL Intravenous Q12H   vancomycin variable dose per unstable renal function (pharmacist dosing)   Does not apply See admin instructions   Continuous Infusions:  sodium chloride Stopped (08/28/21 0522)   ceFEPime (MAXIPIME) IV Stopped (08/28/21 2152)   dextrose 50 mL/hr at 08/29/21 0700   dialysis solution 1.5% low-MG/low-CA     metronidazole Stopped (08/29/21 0546)   PRN Meds:.acetaminophen **OR** acetaminophen, albuterol,  dianeal solution for CAPD/CCPD with heparin, HYDROcodone-acetaminophen, ondansetron **OR** ondansetron (ZOFRAN) IV  Allergies  Allergen Reactions   Icodextrin Rash   Social History   Socioeconomic History   Marital status: Significant Other    Spouse name: Not on file   Number of children: 0   Years of education: Not on file   Highest education level: Not on file  Occupational History   Not on file  Tobacco Use   Smoking status: Never   Smokeless  tobacco: Never  Vaping Use   Vaping Use: Not on file  Substance and Sexual Activity   Alcohol use: No   Drug use: No   Sexual activity: Never    Birth control/protection: Injection  Other Topics Concern   Not on file  Social History Narrative   01/12/21 lives with sister and brother in [place]   Social Determinants of Health   Financial Resource Strain: Not on file  Food Insecurity: Not on file  Transportation Needs: Not on file  Physical Activity: Not on file  Stress: Not on file  Social Connections: Not on file  Intimate Partner Violence: Not on file   Family History  Problem Relation Age of Onset   Hypertension Other    Breast cancer Mother 67   Vitals BP (!) 82/41 (BP Location: Right Arm)   Pulse 96   Temp 97.7 F (36.5 C) (Oral)   Resp (!) 21   Ht 5\' 4"  (1.626 m)   Wt 62.5 kg Comment: after PD DC for day  SpO2 92%   BMI 23.65 kg/m    Physical Exam Constitutional:  Lying in bed, tilted in the left side, crying     Comments:   Cardiovascular:     Rate and Rhythm: Normal rate and regular rhythm.     Heart sounds:    Pulmonary:     Effort: Pulmonary effort is normal.     Comments: basal crackles   Abdominal:     Palpations: Abdomen is soft.     Tenderness: Minimal tenderness, no RT and guarding, distended ( unable to examine PD catheter site)  Musculoskeletal:        General: No swelling or tenderness.   Skin:    Comments: No obvious rashes   Neurological:     General: uncooperative to full exam,  spontaneously moves extremities. Back not examined   Psychiatric:        Mood and Affect: crying   Pertinent Microbiology Results for orders placed or performed during the hospital encounter of 08/26/21  Resp Panel by RT-PCR (Flu A&B, Covid) Nasopharyngeal Swab     Status: None   Collection Time: 08/26/21 10:54 PM   Specimen: Nasopharyngeal Swab; Nasopharyngeal(NP) swabs in vial transport medium  Result Value Ref Range Status   SARS Coronavirus 2 by RT PCR NEGATIVE NEGATIVE Final    Comment: (NOTE) SARS-CoV-2 target nucleic acids are NOT DETECTED.  The SARS-CoV-2 RNA is generally detectable in upper respiratory specimens during the acute phase of infection. The lowest concentration of SARS-CoV-2 viral copies this assay can detect is 138 copies/mL. A negative result does not preclude SARS-Cov-2 infection and should not be used as the sole basis for treatment or other patient management decisions. A negative result may occur with  improper specimen collection/handling, submission of specimen other than nasopharyngeal swab, presence of viral mutation(s) within the areas targeted by this assay, and inadequate number of viral copies(<138 copies/mL). A negative result must be combined with clinical observations, patient history, and epidemiological information. The expected result is Negative.  Fact Sheet for Patients:  EntrepreneurPulse.com.au  Fact Sheet for Healthcare Providers:  IncredibleEmployment.be  This test is no t yet approved or cleared by the [place] FDA and  has been authorized for detection and/or diagnosis of SARS-CoV-2 by FDA under an Emergency Use Authorization (EUA). This EUA will remain  in effect (meaning this test can be used) for the duration of the COVID-19 declaration under Section 564(b)(1) of the Act, 21 U.S.C.section 360bbb-3(b)(1),  unless the authorization is terminated  or revoked sooner.       Influenza A by  PCR NEGATIVE NEGATIVE Final   Influenza B by PCR NEGATIVE NEGATIVE Final    Comment: (NOTE) The Xpert Xpress SARS-CoV-2/FLU/RSV plus assay is intended as an aid in the diagnosis of influenza from Nasopharyngeal swab specimens and should not be used as a sole basis for treatment. Nasal washings and aspirates are unacceptable for Xpert Xpress SARS-CoV-2/FLU/RSV testing.  Fact Sheet for Patients: EntrepreneurPulse.com.au  Fact Sheet for Healthcare Providers: IncredibleEmployment.be  This test is not yet approved or cleared by the [place] FDA and has been authorized for detection and/or diagnosis of SARS-CoV-2 by FDA under an Emergency Use Authorization (EUA). This EUA will remain in effect (meaning this test can be used) for the duration of the COVID-19 declaration under Section 564(b)(1) of the Act, 21 U.S.C. section 360bbb-3(b)(1), unless the authorization is terminated or revoked.  Performed at [facility], [place] [address], [place], [place] [address]   Blood culture (routine x 2)     Status: Abnormal (Preliminary result)   Collection Time: 08/27/21  2:00 AM   Specimen: BLOOD  Result Value Ref Range Status   Specimen Description BLOOD RIGHT ARM  Final   Special Requests   Final    BOTTLES DRAWN AEROBIC AND ANAEROBIC Blood Culture results may not be optimal due to an excessive volume of blood received in culture bottles   Culture  Setup Time   Final    GRAM POSITIVE COCCI AEROBIC BOTTLE ONLY CRITICAL RESULT CALLED TO, READ BACK BY AND VERIFIED WITH: PHARMD GREG ABBOTT 08/28/21@00 :50 BY TW IN BOTH AEROBIC AND ANAEROBIC BOTTLES    Culture (A)  Final    STAPHYLOCOCCUS EPIDERMIDIS THE SIGNIFICANCE OF ISOLATING THIS ORGANISM FROM A SINGLE SET OF BLOOD CULTURES WHEN MULTIPLE SETS ARE DRAWN IS UNCERTAIN. PLEASE NOTIFY THE MICROBIOLOGY DEPARTMENT WITHIN ONE WEEK IF SPECIATION AND SENSITIVITIES ARE REQUIRED. Performed at [facility], [place] [address] [place], [place] [address]    Report Status PENDING  Incomplete  Blood Culture ID Panel (Reflexed)     Status: Abnormal   Collection Time: 08/27/21  2:00 AM  Result Value Ref Range Status   Enterococcus faecalis NOT DETECTED NOT DETECTED Final   Enterococcus Faecium NOT DETECTED NOT DETECTED Final   Listeria monocytogenes NOT DETECTED NOT DETECTED Final   Staphylococcus species DETECTED (A) NOT DETECTED Final    Comment: CRITICAL RESULT CALLED TO, READ BACK BY AND VERIFIED WITH: PHARMD GREG ABBOTT 08/28/21@00 :50 BY TW    Staphylococcus aureus (BCID) NOT DETECTED NOT DETECTED Final   Staphylococcus epidermidis DETECTED (A) NOT DETECTED Final    Comment: Methicillin (oxacillin) resistant coagulase negative staphylococcus. Possible blood culture contaminant (unless isolated from more than one blood culture draw or clinical case suggests pathogenicity). No antibiotic treatment is indicated for blood  culture contaminants. CRITICAL RESULT CALLED TO, READ BACK BY AND VERIFIED WITH: PHARMD GREG ABBOTT 08/28/21@00 :50 BY TW    Staphylococcus lugdunensis NOT DETECTED NOT DETECTED Final   Streptococcus species NOT DETECTED NOT DETECTED Final   Streptococcus agalactiae NOT DETECTED NOT DETECTED Final   Streptococcus pneumoniae NOT DETECTED NOT DETECTED Final   Streptococcus pyogenes NOT DETECTED NOT DETECTED Final   A.calcoaceticus-baumannii NOT DETECTED NOT DETECTED Final   Bacteroides fragilis NOT DETECTED NOT DETECTED Final   Enterobacterales NOT DETECTED NOT DETECTED Final   Enterobacter cloacae complex NOT DETECTED NOT DETECTED Final   Escherichia coli NOT DETECTED  NOT DETECTED Final   Klebsiella aerogenes NOT DETECTED NOT DETECTED Final   Klebsiella oxytoca NOT DETECTED NOT DETECTED Final   Klebsiella pneumoniae NOT DETECTED NOT DETECTED Final   Proteus species NOT DETECTED NOT DETECTED Final   Salmonella species NOT DETECTED NOT DETECTED Final   Serratia  marcescens NOT DETECTED NOT DETECTED Final   Haemophilus influenzae NOT DETECTED NOT DETECTED Final   Neisseria meningitidis NOT DETECTED NOT DETECTED Final   Pseudomonas aeruginosa NOT DETECTED NOT DETECTED Final   Stenotrophomonas maltophilia NOT DETECTED NOT DETECTED Final   Candida albicans NOT DETECTED NOT DETECTED Final   Candida auris NOT DETECTED NOT DETECTED Final   Candida glabrata NOT DETECTED NOT DETECTED Final   Candida krusei NOT DETECTED NOT DETECTED Final   Candida parapsilosis NOT DETECTED NOT DETECTED Final   Candida tropicalis NOT DETECTED NOT DETECTED Final   Cryptococcus neoformans/gattii NOT DETECTED NOT DETECTED Final   Methicillin resistance mecA/C DETECTED (A) NOT DETECTED Final    Comment: CRITICAL RESULT CALLED TO, READ BACK BY AND VERIFIED WITH: PHARMD GREG ABBOTT 08/28/21@00 :50 BY TW Performed at [facility], [address], [place], [place] [address]   MRSA Next Gen by PCR, Nasal     Status: None   Collection Time: 08/27/21  3:29 AM   Specimen: Nasal Mucosa; Nasal Swab  Result Value Ref Range Status   MRSA by PCR Next Gen NOT DETECTED NOT DETECTED Final    Comment: (NOTE) The GeneXpert MRSA Assay (FDA approved for NASAL specimens only), is one component of a comprehensive MRSA colonization surveillance program. It is not intended to diagnose MRSA infection nor to guide or monitor treatment for MRSA infections. Test performance is not FDA approved in patients less than 66 years old. Performed at [facility], [place] [address] [place], [place] [address]   Blood culture (routine x 2)     Status: None (Preliminary result)   Collection Time: 08/27/21  7:57 AM   Specimen: BLOOD RIGHT FOREARM  Result Value Ref Range Status   Specimen Description BLOOD RIGHT FOREARM  Final   Special Requests   Final    BOTTLES DRAWN AEROBIC AND ANAEROBIC Blood Culture adequate volume   Culture   Final    NO GROWTH 2 DAYS Performed at [facility],  [place] [address], [place], [place] [address]    Report Status PENDING  Incomplete  Body fluid culture w Gram Stain     Status: Abnormal (Preliminary result)   Collection Time: 08/27/21  2:16 PM   Specimen: Peritoneal Washings; Body Fluid  Result Value Ref Range Status   Specimen Description PERITONEAL FLUID  Final   Special Requests NONE  Final   Gram Stain   Final    WBC PRESENT,BOTH PMN AND MONONUCLEAR GRAM POSITIVE COCCI CYTOSPIN SMEAR    Culture (A)  Final    STAPHYLOCOCCUS EPIDERMIDIS CULTURE REINCUBATED FOR BETTER GROWTH Performed at [facility], [place] [address] [place], [place] [address]    Report Status PENDING  Incomplete  C Difficile Quick Screen w PCR reflex     Status: Abnormal   Collection Time: 08/28/21 10:25 AM   Specimen: STOOL  Result Value Ref Range Status   C Diff antigen POSITIVE (A) NEGATIVE Final    Comment: RESULTS REPORTED TO RN BEABRAUT 08/28/21 AT 1120 BY NM   C Diff toxin NEGATIVE NEGATIVE Final   C Diff interpretation Results are indeterminate. See PCR results.  Final  Comment: Performed at [facility], [place] [address] [place], [place] [address]  C. Diff by PCR, Reflexed     Status: Abnormal   Collection Time: 08/28/21 10:25 AM  Result Value Ref Range Status   Toxigenic C. Difficile by PCR POSITIVE (A) NEGATIVE Final    Comment: Positive for toxigenic C. difficile with little to no toxin production. Only treat if clinical presentation suggests symptomatic illness. Performed at [facility], [place] [address], [place], [place] [address]    Pertinent Lab seen by me: CBC Latest Ref Rng & Units 08/29/2021 08/28/2021 08/28/2021  WBC 4.0 - 10.5 K/uL 6.8 - -  Hemoglobin 12.0 - 15.0 g/dL 9.9(L) 9.7(L) 6.0(LL)  Hematocrit 36.0 - 46.0 % 29.6(L) 29.0(L) 18.9(L)  Platelets 150 - 400 K/uL 191 - -   CMP Latest Ref Rng & Units 08/29/2021 08/28/2021 08/27/2021  Glucose 70 - 99 mg/dL 108(H) 43(LL) 46(L)  BUN 6 - 20 mg/dL 43(H) 48(H) 53(H)  Creatinine  0.44 - 1.00 mg/dL 4.14(H) 4.26(H) 4.65(H)  Sodium 135 - 145 mmol/L 129(L) 132(L) 132(L)  Potassium 3.5 - 5.1 mmol/L 3.1(L) 3.7 4.8  Chloride 98 - 111 mmol/L 96(L) 99 98  CO2 22 - 32 mmol/L 17(L) 21(L) 20(L)  Calcium 8.9 - 10.3 mg/dL 8.3(L) 8.4(L) 7.5(L)  Total Protein 6.5 - 8.1 g/dL - 4.5(L) 4.6(L)  Total Bilirubin 0.3 - 1.2 mg/dL - 0.2(L) 0.5  Alkaline Phos 38 - 126 U/L - 100 178(H)  AST 15 - 41 U/L - 11(L) 17  ALT 0 - 44 U/L - 8 11     Pertinent Imagings/Other Imagings Plain films and CT images have been personally visualized and interpreted; radiology reports have been reviewed. Decision making incorporated into the Impression / Recommendations. CT ABDOMEN PELVIS WO CONTRAST  Result Date: 08/27/2021 CLINICAL DATA:  Concern for peritonitis or perforation. EXAM: CT ABDOMEN AND PELVIS WITHOUT CONTRAST TECHNIQUE: Multidetector CT imaging of the abdomen and pelvis was performed following the standard protocol without IV contrast. COMPARISON:  CT abdomen pelvis dated 01/25/2021. FINDINGS: Evaluation of this exam is limited in the absence of intravenous contrast. Lower chest: Diffuse interstitial reticular and nodular densities of the lung bases consistent with pneumonia or aspiration. Clinical correlation is recommended. There is coronary vascular calcification. No intra-abdominal free air.  Small ascites. Hepatobiliary: The liver is unremarkable. No intrahepatic biliary dilatation. There is a stone in the neck of the gallbladder. Pancreas: Unremarkable. No pancreatic ductal dilatation or surrounding inflammatory changes. Spleen: Normal in size without focal abnormality. Adrenals/Urinary Tract: The adrenal glands unremarkable. Moderate bilateral renal parenchyma atrophy and scattered dystrophic calcification versus nonobstructing stones consistent with chronic renal failure. Small bilateral renal cysts suboptimally evaluated. There is no hydronephrosis on either side. The visualized ureters appear  unremarkable. The urinary bladder is minimally distended and grossly unremarkable. Stomach/Bowel: There is no bowel obstruction. Diffuse thickened appearance of the small bowel likely related to ascites. Enteritis is less likely. The appendix is normal as visualized. Vascular/Lymphatic: Mild aortoiliac atherosclerotic disease. The IVC is unremarkable. No portal venous gas. There is no adenopathy. Reproductive: The uterus and ovaries are grossly unremarkable. Other: Diffuse subcutaneous edema. Peritoneal dialysis catheter with tip in the right lower quadrant. Musculoskeletal: Osteopenia and renal osteodystrophy. Bilateral femoral head avascular necrosis. No acute osseous pathology. IMPRESSION: 1. Bilateral lower lobe pneumonia or aspiration. 2. Small ascites. Peritoneal dialysis catheter with tip in the right lower quadrant. 3. Thickened appearance of the small bowel likely related to ascites. Enteritis is less likely.  No bowel obstruction. Normal appendix. 4. Cholelithiasis. 5. Aortic Atherosclerosis (ICD10-I70.0). Electronically Signed   By: Anner Crete M.D.   On: 08/27/2021 01:22   DG Chest Portable 1 View  Result Date: 08/27/2021 CLINICAL DATA:  Sepsis EXAM: PORTABLE CHEST 1 VIEW COMPARISON:  None. FINDINGS: The heart size and mediastinal contours are within normal limits. Both lungs are clear. The visualized skeletal structures are unremarkable. IMPRESSION: No active disease. Electronically Signed   By: Ulyses Jarred M.D.   On: 08/27/2021 00:21    I spent more than 80   minutes for this patient encounter including review of prior medical records/discussing diagnostics and treatment plan with the patient/family/coordinate care with primary/other specialits with greater than 50% of time in face to face encounter.   Electronically signed by:   Rosiland Oz, MD Infectious Disease Physician [facility] for Infectious Disease Pager: [phone]

## 2021-08-30 DIAGNOSIS — A419 Sepsis, unspecified organism: Secondary | ICD-10-CM | POA: Diagnosis not present

## 2021-08-30 DIAGNOSIS — T8571XS Infection and inflammatory reaction due to peritoneal dialysis catheter, sequela: Secondary | ICD-10-CM

## 2021-08-30 DIAGNOSIS — R7881 Bacteremia: Secondary | ICD-10-CM | POA: Diagnosis not present

## 2021-08-30 DIAGNOSIS — Z221 Carrier of other intestinal infectious diseases: Secondary | ICD-10-CM | POA: Diagnosis not present

## 2021-08-30 DIAGNOSIS — T8571XD Infection and inflammatory reaction due to peritoneal dialysis catheter, subsequent encounter: Secondary | ICD-10-CM | POA: Diagnosis not present

## 2021-08-30 DIAGNOSIS — J690 Pneumonitis due to inhalation of food and vomit: Secondary | ICD-10-CM | POA: Diagnosis not present

## 2021-08-30 LAB — BASIC METABOLIC PANEL
Anion gap: 15 (ref 5–15)
BUN: 40 mg/dL — ABNORMAL HIGH (ref 6–20)
CO2: 18 mmol/L — ABNORMAL LOW (ref 22–32)
Calcium: 8.7 mg/dL — ABNORMAL LOW (ref 8.9–10.3)
Chloride: 94 mmol/L — ABNORMAL LOW (ref 98–111)
Creatinine, Ser: 4.22 mg/dL — ABNORMAL HIGH (ref 0.44–1.00)
GFR, Estimated: 12 mL/min — ABNORMAL LOW (ref >60)
Glucose, Bld: 88 mg/dL (ref 70–99)
Potassium: 3.5 mmol/L (ref 3.5–5.1)
Sodium: 127 mmol/L — ABNORMAL LOW (ref 135–145)

## 2021-08-30 LAB — BODY FLUID CELL COUNT WITH DIFFERENTIAL
Eos, Fluid: 0 %
Lymphs, Fluid: 25 %
Monocyte-Macrophage-Serous Fluid: 19 % — ABNORMAL LOW (ref 50–90)
Neutrophil Count, Fluid: 56 % — ABNORMAL HIGH (ref 0–25)
Total Nucleated Cell Count, Fluid: 221 cu mm (ref 0–1000)

## 2021-08-30 LAB — BODY FLUID CULTURE W GRAM STAIN

## 2021-08-30 LAB — CBC
HCT: 31.3 % — ABNORMAL LOW (ref 36.0–46.0)
Hemoglobin: 10.7 g/dL — ABNORMAL LOW (ref 12.0–15.0)
MCH: 28.3 pg (ref 26.0–34.0)
MCHC: 34.2 g/dL (ref 30.0–36.0)
MCV: 82.8 fL (ref 80.0–100.0)
Platelets: 164 10*3/uL (ref 150–400)
RBC: 3.78 MIL/uL — ABNORMAL LOW (ref 3.87–5.11)
RDW: 23.5 % — ABNORMAL HIGH (ref 11.5–15.5)
WBC: 6.3 10*3/uL (ref 4.0–10.5)
nRBC: 0 % (ref 0.0–0.2)

## 2021-08-30 LAB — VANCOMYCIN, RANDOM: Vancomycin Rm: 19

## 2021-08-30 LAB — GLUCOSE, CAPILLARY
Glucose-Capillary: 119 mg/dL — ABNORMAL HIGH (ref 70–99)
Glucose-Capillary: 71 mg/dL (ref 70–99)
Glucose-Capillary: 78 mg/dL (ref 70–99)
Glucose-Capillary: 71 mg/dL (ref 70–99)
Glucose-Capillary: 70 mg/dL (ref 70–99)

## 2021-08-30 LAB — MAGNESIUM: Magnesium: 1.7 mg/dL (ref 1.7–2.4)

## 2021-08-30 LAB — PHOSPHORUS: Phosphorus: 4 mg/dL (ref 2.5–4.6)

## 2021-08-30 LAB — PATHOLOGIST SMEAR REVIEW

## 2021-08-30 MED ORDER — NYSTATIN 100000 UNIT/ML MT SUSP
5.0000 mL | Freq: Four times a day (QID) | OROMUCOSAL | Status: DC
  Administered 2021-08-30 – 2021-08-31 (×6): 500000 [IU] via ORAL
  Filled 2021-08-30 (×4): qty 5

## 2021-08-30 MED ORDER — VANCOMYCIN HCL 1000 MG/200ML IV SOLN
1000.0000 mg | INTRAVENOUS | Status: DC
  Administered 2021-08-30: 1000 mg via INTRAVENOUS
  Filled 2021-08-30 (×2): qty 200

## 2021-08-30 MED ORDER — POTASSIUM CHLORIDE 10 MEQ/100ML IV SOLN
10.0000 meq | INTRAVENOUS | Status: AC
  Administered 2021-08-30 (×2): 10 meq via INTRAVENOUS
  Filled 2021-08-30 (×2): qty 100

## 2021-08-30 MED ORDER — PANTOPRAZOLE SODIUM 40 MG PO TBEC
40.0000 mg | DELAYED_RELEASE_TABLET | Freq: Every day | ORAL | Status: DC
  Administered 2021-08-30 – 2021-08-31 (×2): 40 mg via ORAL
  Filled 2021-08-30 (×2): qty 1

## 2021-08-30 MED ORDER — MAGNESIUM SULFATE 2 GM/50ML IV SOLN
2.0000 g | Freq: Once | INTRAVENOUS | Status: AC
  Administered 2021-08-30: 2 g via INTRAVENOUS
  Filled 2021-08-30: qty 50

## 2021-08-30 MED ORDER — HEPARIN SODIUM (PORCINE) 5000 UNIT/ML IJ SOLN
5000.0000 [IU] | Freq: Three times a day (TID) | INTRAMUSCULAR | Status: DC
  Administered 2021-08-30 – 2021-09-11 (×33): 5000 [IU] via SUBCUTANEOUS
  Filled 2021-08-30 (×35): qty 1

## 2021-08-30 NOTE — Progress Notes (Addendum)
[facility] Progress Note   Subjective:   Not offering any complaints this morning. Off D10 now.  Per RN not interacting much.  Per PD RN UF 686mL.   Objective Vitals:   08/30/21 0630 08/30/21 0700 08/30/21 0736 08/30/21 0838  BP: (!) 86/50 (!) 88/58  91/60  Pulse: 94 96  94  Resp: (!) 23 (!) 24  (!) 24  Temp:   98.6 F (37 C) (!) 97.4 F (36.3 C)  TempSrc:   Oral Oral  SpO2: 94% 94%  95%  Weight:      Height:         Additional Objective Labs: Basic Metabolic Panel: Recent Labs  Lab 08/28/21 0250 08/29/21 0245 08/30/21 0442  NA 132* 129* 127*  K 3.7 3.1* 3.5  CL 99 96* 94*  CO2 21* 17* 18*  GLUCOSE 43* 108* 88  BUN 48* 43* 40*  CREATININE 4.26* 4.14* 4.22*  CALCIUM 8.4* 8.3* 8.7*  PHOS 4.4 3.8 4.0    CBC: Recent Labs  Lab 08/26/21 2229 08/27/21 0735 08/28/21 0250 08/28/21 0435 08/28/21 1251 08/29/21 0245 08/30/21 0442  WBC 10.1 9.5 5.0  --   --  6.8 6.3  NEUTROABS 8.1*  --   --   --   --   --   --   HGB 10.3* 8.8* 6.0*   < > 9.7* 9.9* 10.7*  HCT 32.6* 26.9* 18.7*   < > 29.0* 29.6* 31.3*  MCV 91.8 92.1 93.0  --   --  83.9 82.8  PLT 369 283 188  --   --  191 164   < > = values in this interval not displayed.    Blood Culture    Component Value Date/Time   SDES BLOOD RIGHT HAND 08/29/2021 1233   SPECREQUEST  08/29/2021 1233    BOTTLES DRAWN AEROBIC AND ANAEROBIC Blood Culture adequate volume   CULT  08/29/2021 1233    NO GROWTH < 24 HOURS Performed at [facility], [place] [address] [place], [place] [address]    REPTSTATUS PENDING 08/29/2021 1233     Physical Exam General: Alert, lying on side, nad  Heart: RRR No m,r,g Lungs: Clear bilaterally  Abdomen: soft, minimally tender - looked at exit site which shows no erythema or drainage.  No TTP tunnel Extremities: trace LE edema bilaterally Dialysis Access: PD cath in place   Medications:  sodium chloride Stopped (08/28/21 0522)   ampicillin-sulbactam (UNASYN) IV 3 g  (08/29/21 2124)   dialysis solution 1.5% low-MG/low-CA     dialysis solution 2.5% low-MG/low-CA      benztropine  1 mg Oral QHS   Chlorhexidine Gluconate Cloth  6 each Topical Daily   divalproex  500 mg Oral BID   FLUoxetine  20 mg Oral Daily   gentamicin cream  1 application Topical Daily   levETIRAcetam  500 mg Oral Daily   levothyroxine  50 mcg Oral Q breakfast   midodrine  5 mg Oral TID WC   nystatin  5 mL Oral QID   pantoprazole (PROTONIX) IV  40 mg Intravenous Q24H   risperiDONE  3 mg Oral QHS   sodium chloride flush  3 mL Intravenous Q12H   vancomycin variable dose per unstable renal function (pharmacist dosing)   Does not apply See admin instructions    Dialysis Orders:  [facility] [phone] (HT not open on weekends)  OP Nephrologist: Dimas Chyle  Ambulatory PD  7x/week  4 exchanges 2L  Assessment/Plan: Sepsis --Secondary to  aspiration PNA and peritonitis. 1/2 Blood cx +MRSE. Fluid cx showing the same.   ID following - vanc +unasyn IV now.  PD fluid cell count last PM was improving and no e/o tunnel infection on today's exam.  Cont current therapy but add fungal peritonitis prophylaxis with nystatin po QID.  Hypotension/volume  - Hypotensive 2/2 shock. S/p volume resuscitation. Pressor support on admission. Midodrine started.  BPs remain marginal.  ESRD -  Will continue CCPD tonight - use 1.5%/2.5% again tonight Anemia  - Hgb 8.8 >6.0 > 9s> 10s.    ?GI bleed. Hemoccult pending.  Metabolic bone disease -  Corr Ca/Phos ok.  Nutrition - Alb low.  Prot supp when taking PO Schizoaffective/Bipolar disorder - per primary  Hypokalemia - repleted.  Hyponatremia - secondary to infusion of hypotonic fluids with D10 drip which is now off.  UF with PD tonight  Jannifer Hick MD [facility] Pager [phone]

## 2021-08-30 NOTE — Progress Notes (Signed)
NAME:  Jane [name], MRN:  [id number], DOB:  May 08, 1966, LOS: 3 ADMISSION DATE:  08/26/2021, CONSULTATION DATE:  08/27/21 REFERRING MD:  Aileen Fass, CHIEF COMPLAINT:  N/V   History of Present Illness:  55 year old woman with hx of ESRD on PD, schizoaffective disorder, prior discitis/osteomyelitis/epidural abscess earlier this year presenting with N/V/D, abd pain, and cough.  Workup in ER c/w septic shock, possible aspiration PNA, shock not responding to fluids, and persistent lactic acidemia for which PCCM is consulted.    Pertinent  Medical History  ESRD on PD followed by [facility] Cognitive impairment/ mental retardation Depression/Bipolar/schizophrenia/seizures (no AEDs) HTN HLD Hypothyroidism  Significant Hospital Events: Including procedures, antibiotic start and stop dates in addition to other pertinent events   11/5 admitted sepsis   11/5 CT abd/ pelvis > 1. Bilateral lower lobe pneumonia or aspiration. 2. Small ascites. Peritoneal dialysis catheter with tip in the right lower quadrant. 3. Thickened appearance of the small bowel likely related to ascites. Enteritis is less likely. No bowel obstruction. Normal appendix. 4. Cholelithiasis. 5. Aortic Atherosclerosis  Interim History / Subjective:  Tolerating midodrine Episode of vomiting 08/28/2021, one loose stool 11/7 Peritoneal fluid with MRSE- antibiotics switched to unasyn  Objective   Blood pressure (!) 88/58, pulse 96, temperature 98.6 F (37 C), temperature source Oral, resp. rate (!) 24, height 5\' 4"  (1.626 m), weight 64.1 kg, SpO2 94 %.        Intake/Output Summary (Last 24 hours) at 08/30/2021 0824 Last data filed at 08/29/2021 1800 Gross per 24 hour  Intake 870.42 ml  Output --  Net 870.42 ml   Filed Weights   08/28/21 0700 08/29/21 1930  Weight: 62.5 kg 64.1 kg    Examination: General: Chronically ill-appearing  HEENT: Moist oral mucosa Neuro: Interactive CV: S1-S2 appreciated PULM:  Decreased air movement GI: Bowel sounds appreciated, PD catheter in place Extremities: Lower extremity edema Skin: no rashes  Labs reviewed: H/H noted  Resolved Hospital Problem list   N/A  Assessment & Plan:   Septic shock Aspiration pneumonitis Peritonitis/SBP -Continue midodrine to support blood pressure -Continue Unasyn   End-stage renal disease on peritoneal dialysis -Appreciate nephrology input -Continue peritoneal dialysis  Anemia, thrombocytopenia -Likely related to sepsis -No obvious source of bleeding -We will continue to monitor  Started on enteral nutrition  History of heart failure -Continue to monitor -Volume removal with peritoneal dialysis  Multiple cognitive and psychiatric comorbidities -Continue home meds  Hypothyroidism -Continue home medication  Best Practice (right click and "Reselect all SmartList Selections" daily)   Diet/type: NPO w/ oral meds DVT prophylaxis: prophylactic heparin - holding today given Hgb drop GI prophylaxis: N/A- start PPI  Lines: N/A- pta PD catheter Foley:  N/A Code Status:  full code, confirmed with family 11/5 Last date of multidisciplinary goals of care discussion.  11/5  No family at bedside Caregiver was in early this a.m.  Labs   CBC: Recent Labs  Lab 08/26/21 2229 08/27/21 0735 08/28/21 0250 08/28/21 0435 08/28/21 1251 08/29/21 0245 08/30/21 0442  WBC 10.1 9.5 5.0  --   --  6.8 6.3  NEUTROABS 8.1*  --   --   --   --   --   --   HGB 10.3* 8.8* 6.0* 6.0* 9.7* 9.9* 10.7*  HCT 32.6* 26.9* 18.7* 18.9* 29.0* 29.6* 31.3*  MCV 91.8 92.1 93.0  --   --  83.9 82.8  PLT 369 283 188  --   --  191 164  Basic Metabolic Panel: Recent Labs  Lab 08/26/21 2229 08/26/21 2348 08/27/21 0735 08/28/21 0250 08/29/21 0245 08/30/21 0442  NA 131*  --  132* 132* 129* 127*  K 4.2  --  4.8 3.7 3.1* 3.5  CL 91*  --  98 99 96* 94*  CO2 23  --  20* 21* 17* 18*  GLUCOSE 84  --  46* 43* 108* 88  BUN 52*  --  53*  48* 43* 40*  CREATININE 4.80*  --  4.65* 4.26* 4.14* 4.22*  CALCIUM 8.2*  --  7.5* 8.4* 8.3* 8.7*  MG  --  1.4*  --  1.7 1.9 1.7  PHOS  --   --   --  4.4 3.8 4.0   GFR: Estimated Creatinine Clearance: 13.2 mL/min (A) (by C-G formula based on SCr of 4.22 mg/dL (H)). Recent Labs  Lab 08/26/21 2348 08/27/21 0121 08/27/21 0735 08/27/21 1109 08/28/21 0250 08/29/21 0245 08/30/21 0442  PROCALCITON  --   --  1.62  --  1.12 1.34  --   WBC  --   --  9.5  --  5.0 6.8 6.3  LATICACIDVEN 3.4* 3.0*  --  1.0  --   --   --     Liver Function Tests: Recent Labs  Lab 08/26/21 2229 08/27/21 0735 08/28/21 0250  AST 15 17 11*  ALT 10 11 8   ALKPHOS 182* 178* 100  BILITOT 0.7 0.5 0.2*  PROT 5.3* 4.6* 4.5*  ALBUMIN <1.5* <1.5* 2.4*   Recent Labs  Lab 08/26/21 2229  LIPASE 17   No results for input(s): AMMONIA in the last 168 hours.  ABG    Component Value Date/Time   PHART 7.473 (H) 01/25/2021 0830   PCO2ART 36.1 01/25/2021 0830   PO2ART 276 (H) 01/25/2021 0830   HCO3 26.7 01/25/2021 0830   TCO2 28 01/25/2021 0830   O2SAT 100.0 01/25/2021 0830     Coagulation Profile: No results for input(s): INR, PROTIME in the last 168 hours.  Cardiac Enzymes: No results for input(s): CKTOTAL, CKMB, CKMBINDEX, TROPONINI in the last 168 hours.  HbA1C: Hgb A1c MFr Bld  Date/Time Value Ref Range Status  01/25/2021 05:45 PM 5.6 4.8 - 5.6 % Final    Comment:    (NOTE) Pre diabetes:          5.7%-6.4%  Diabetes:              >6.4%  Glycemic control for   <7.0% adults with diabetes   10/08/2017 12:29 AM 4.7 (L) 4.8 - 5.6 % Final    Comment:    (NOTE)         Prediabetes: 5.7 - 6.4         Diabetes: >6.4         Glycemic control for adults with diabetes: <7.0     CBG: Recent Labs  Lab 08/29/21 1519 08/29/21 2005 08/29/21 2345 08/30/21 0404 08/30/21 0734  GLUCAP 92 71 136* 119* 71    The patient is critically ill with multiple organ systems failure and requires high  complexity decision making for assessment and support, frequent evaluation and titration of therapies, application of advanced monitoring technologies and extensive interpretation of multiple databases. Critical Care Time devoted to patient care services described in this note independent of APP/resident time (if applicable)  is 30 minutes.   Sherrilyn Rist MD [facility] Personal pager: See Amion If unanswered, please page CCM On-call: [phone]

## 2021-08-30 NOTE — Progress Notes (Signed)
AM medication administration questionable intake. Patient refusing to swallow medications. Attempted with applesauce and yogurt, patient screaming and crying during administration. With second staff member patient would take left over medications with sips of water.   No left over medications seen in applesauce spit or yogurt spit.

## 2021-08-30 NOTE — Progress Notes (Signed)
[facility] Infectious Diseases Follow Up Note  Patient Identification: Patient Name: Jane [name] MRN: [id number] [place] Date: 08/26/2021 10:15 PM Age: 55 y.o.Today's Date: 08/30/2021   Reason for Visit: Peritonitis   Principal Problem:   Sepsis [facility]) Active Problems:   Schizoaffective disorder ([place])   End-stage renal disease on peritoneal dialysis [facility])   Seizure disorder ([place])   Aspiration pneumonia of both lower lobes due to gastric secretions (HCC)   Septic shock (HCC)   Dialysis-associated peritonitis ([place])   Staphylococcus epidermidis bacteremia  Antibiotics: Cefepime 11/4, 11/6                    Metronidazole 11/4-current                        Lines/Hardware: PD catheter  Interval Events: remains afebrile, no leukocytosis, C diff antigen Positive but toxin negative    Assessment SBP ( Staph epidermidis) in the setting of ESRD on PD: Repeat PF analysis 11/8 with significant improvement in WBC count. No evidence of exit site/tunnel infection on exam today   ? Aspiration PNA - on room air, on Unasyn   C diff ag positive but toxin negative: only 2 episodes of soft stool yesterday  MRSE 1/2 sets  H/o discitis and epidural abscess s/p laminectomy in 01/25/21( MSSE) and completion of 8 weeks of abtx ( IV and PO) Anemia - FOBT+, Transfusion needs and work up per primary and GI Electrolyte abnormalities ( hypokalemia, hyponatremia) : replacement per Nephro and primary Hypotension- on midodrine  Schizoaffective d/o and Bipolar    Recommendations Continue Vancomycin, dosing per pharmacy. Planning for 2 weeks course  Complete 5 days course of Unasyn for ? Aspiration PNA Fu sensitivities for Staph epi from blood and peritoneal fluid Fu repeat blood cx Would not recommend to treat for C diff given indeterminate test results ( she had 2 soft BM yesterday per RN and none today so far)  Discussed with RN/ID pharmacy and  Dr Ander Slade  Following   Rest of the management as per the primary team. Thank you for the consult. Please page with pertinent questions or concerns.  ______________________________________________________________________ Subjective patient seen and examined at the bedside. She is resting in bed comfortably and breaks into crying spells when I tried to examine    Vitals BP (!) 88/58   Pulse 96   Temp 98.6 F (37 C) (Oral)   Resp (!) 24   Ht 5\' 4"  (1.626 m)   Wt 64.1 kg   SpO2 94%   BMI 24.26 kg/m     Physical Exam Constitutional:  resting in bed comfortably    Comments:   Cardiovascular:     Rate and Rhythm: Normal rate and regular rhythm.     Heart sounds:  Pulmonary:     Effort: Pulmonary effort is normal.     Comments:   Abdominal:     Palpations: Abdomen is soft.     Tenderness: Non tender, no guarding, no evidence of exit and tunnel infection of PD catheter site   Musculoskeletal:        General: No swelling or tenderness.   Skin:    Comments: No obvious lesions or rashes   Neurological:     General: moves all extremities   Psychiatric:        Mood and Affect: difficult to assess    Pertinent Microbiology Results for orders placed or performed during the hospital encounter of 08/26/21  Resp Panel by RT-PCR (Flu A&B, Covid) Nasopharyngeal Swab     Status: None   Collection Time: 08/26/21 10:54 PM   Specimen: Nasopharyngeal Swab; Nasopharyngeal(NP) swabs in vial transport medium  Result Value Ref Range Status   SARS Coronavirus 2 by RT PCR NEGATIVE NEGATIVE Final    Comment: (NOTE) SARS-CoV-2 target nucleic acids are NOT DETECTED.  The SARS-CoV-2 RNA is generally detectable in upper respiratory specimens during the acute phase of infection. The lowest concentration of SARS-CoV-2 viral copies this assay can detect is 138 copies/mL. A negative result does not preclude SARS-Cov-2 infection and should not be used as the sole basis for treatment  or other patient management decisions. A negative result may occur with  improper specimen collection/handling, submission of specimen other than nasopharyngeal swab, presence of viral mutation(s) within the areas targeted by this assay, and inadequate number of viral copies(<138 copies/mL). A negative result must be combined with clinical observations, patient history, and epidemiological information. The expected result is Negative.  Fact Sheet for Patients:  EntrepreneurPulse.com.au  Fact Sheet for Healthcare Providers:  IncredibleEmployment.be  This test is no t yet approved or cleared by the [place] FDA and  has been authorized for detection and/or diagnosis of SARS-CoV-2 by FDA under an Emergency Use Authorization (EUA). This EUA will remain  in effect (meaning this test can be used) for the duration of the COVID-19 declaration under Section 564(b)(1) of the Act, 21 U.S.C.section 360bbb-3(b)(1), unless the authorization is terminated  or revoked sooner.       Influenza A by PCR NEGATIVE NEGATIVE Final   Influenza B by PCR NEGATIVE NEGATIVE Final    Comment: (NOTE) The Xpert Xpress SARS-CoV-2/FLU/RSV plus assay is intended as an aid in the diagnosis of influenza from Nasopharyngeal swab specimens and should not be used as a sole basis for treatment. Nasal washings and aspirates are unacceptable for Xpert Xpress SARS-CoV-2/FLU/RSV testing.  Fact Sheet for Patients: EntrepreneurPulse.com.au  Fact Sheet for Healthcare Providers: IncredibleEmployment.be  This test is not yet approved or cleared by the [place] FDA and has been authorized for detection and/or diagnosis of SARS-CoV-2 by FDA under an Emergency Use Authorization (EUA). This EUA will remain in effect (meaning this test can be used) for the duration of the COVID-19 declaration under Section 564(b)(1) of the Act, 21 U.S.C. section  360bbb-3(b)(1), unless the authorization is terminated or revoked.  Performed at [facility], [place] [address], [place], [place] [address]   Blood culture (routine x 2)     Status: Abnormal (Preliminary result)   Collection Time: 08/27/21  2:00 AM   Specimen: BLOOD  Result Value Ref Range Status   Specimen Description BLOOD RIGHT ARM  Final   Special Requests   Final    BOTTLES DRAWN AEROBIC AND ANAEROBIC Blood Culture results may not be optimal due to an excessive volume of blood received in culture bottles   Culture  Setup Time   Final    GRAM POSITIVE COCCI AEROBIC BOTTLE ONLY CRITICAL RESULT CALLED TO, READ BACK BY AND VERIFIED WITH: PHARMD GREG ABBOTT 08/28/21@00 :50 BY TW IN BOTH AEROBIC AND ANAEROBIC BOTTLES    Culture (A)  Final    STAPHYLOCOCCUS EPIDERMIDIS SUSCEPTIBILITIES TO FOLLOW CULTURE REINCUBATED FOR BETTER GROWTH Performed at [facility], [place] [address], [place], [place] [address]    Report Status PENDING  Incomplete  Blood Culture ID Panel (Reflexed)     Status: Abnormal   Collection Time: 08/27/21  2:00 AM  Result Value Ref Range Status   Enterococcus faecalis NOT DETECTED NOT DETECTED Final   Enterococcus Faecium NOT DETECTED NOT DETECTED Final   Listeria monocytogenes NOT DETECTED NOT DETECTED Final   Staphylococcus species DETECTED (A) NOT DETECTED Final    Comment: CRITICAL RESULT CALLED TO, READ BACK BY AND VERIFIED WITH: PHARMD GREG ABBOTT 08/28/21@00 :50 BY TW    Staphylococcus aureus (BCID) NOT DETECTED NOT DETECTED Final   Staphylococcus epidermidis DETECTED (A) NOT DETECTED Final    Comment: Methicillin (oxacillin) resistant coagulase negative staphylococcus. Possible blood culture contaminant (unless isolated from more than one blood culture draw or clinical case suggests pathogenicity). No antibiotic treatment is indicated for blood  culture contaminants. CRITICAL RESULT CALLED TO, READ BACK BY AND VERIFIED WITH: PHARMD GREG ABBOTT  08/28/21@00 :50 BY TW    Staphylococcus lugdunensis NOT DETECTED NOT DETECTED Final   Streptococcus species NOT DETECTED NOT DETECTED Final   Streptococcus agalactiae NOT DETECTED NOT DETECTED Final   Streptococcus pneumoniae NOT DETECTED NOT DETECTED Final   Streptococcus pyogenes NOT DETECTED NOT DETECTED Final   A.calcoaceticus-baumannii NOT DETECTED NOT DETECTED Final   Bacteroides fragilis NOT DETECTED NOT DETECTED Final   Enterobacterales NOT DETECTED NOT DETECTED Final   Enterobacter cloacae complex NOT DETECTED NOT DETECTED Final   Escherichia coli NOT DETECTED NOT DETECTED Final   Klebsiella aerogenes NOT DETECTED NOT DETECTED Final   Klebsiella oxytoca NOT DETECTED NOT DETECTED Final   Klebsiella pneumoniae NOT DETECTED NOT DETECTED Final   Proteus species NOT DETECTED NOT DETECTED Final   Salmonella species NOT DETECTED NOT DETECTED Final   Serratia marcescens NOT DETECTED NOT DETECTED Final   Haemophilus influenzae NOT DETECTED NOT DETECTED Final   Neisseria meningitidis NOT DETECTED NOT DETECTED Final   Pseudomonas aeruginosa NOT DETECTED NOT DETECTED Final   Stenotrophomonas maltophilia NOT DETECTED NOT DETECTED Final   Candida albicans NOT DETECTED NOT DETECTED Final   Candida auris NOT DETECTED NOT DETECTED Final   Candida glabrata NOT DETECTED NOT DETECTED Final   Candida krusei NOT DETECTED NOT DETECTED Final   Candida parapsilosis NOT DETECTED NOT DETECTED Final   Candida tropicalis NOT DETECTED NOT DETECTED Final   Cryptococcus neoformans/gattii NOT DETECTED NOT DETECTED Final   Methicillin resistance mecA/C DETECTED (A) NOT DETECTED Final    Comment: CRITICAL RESULT CALLED TO, READ BACK BY AND VERIFIED WITH: PHARMD GREG ABBOTT 08/28/21@00 :50 BY TW Performed at [facility], [address], [place], [place] [address]   MRSA Next Gen by PCR, Nasal     Status: None   Collection Time: 08/27/21  3:29 AM   Specimen: Nasal Mucosa; Nasal Swab  Result Value Ref  Range Status   MRSA by PCR Next Gen NOT DETECTED NOT DETECTED Final    Comment: (NOTE) The GeneXpert MRSA Assay (FDA approved for NASAL specimens only), is one component of a comprehensive MRSA colonization surveillance program. It is not intended to diagnose MRSA infection nor to guide or monitor treatment for MRSA infections. Test performance is not FDA approved in patients less than 81 years old. Performed at [facility], [place] [address] [place], [place] [address]   Blood culture (routine x 2)     Status: None (Preliminary result)   Collection Time: 08/27/21  7:57 AM   Specimen: BLOOD RIGHT FOREARM  Result Value Ref Range Status   Specimen Description BLOOD RIGHT FOREARM  Final   Special Requests   Final    BOTTLES DRAWN AEROBIC AND ANAEROBIC Blood Culture adequate  volume   Culture   Final    NO GROWTH 2 DAYS Performed at [facility], [place] [address], [place], [place] [address]    Report Status PENDING  Incomplete  Body fluid culture w Gram Stain     Status: Abnormal (Preliminary result)   Collection Time: 08/27/21  2:16 PM   Specimen: Peritoneal Washings; Body Fluid  Result Value Ref Range Status   Specimen Description PERITONEAL FLUID  Final   Special Requests NONE  Final   Gram Stain   Final    WBC PRESENT,BOTH PMN AND MONONUCLEAR GRAM POSITIVE COCCI CYTOSPIN SMEAR    Culture (A)  Final    STAPHYLOCOCCUS EPIDERMIDIS SUSCEPTIBILITIES TO FOLLOW Performed at [facility], [place] [address], [place], [place] [address]    Report Status PENDING  Incomplete  C Difficile Quick Screen w PCR reflex     Status: Abnormal   Collection Time: 08/28/21 10:25 AM   Specimen: STOOL  Result Value Ref Range Status   C Diff antigen POSITIVE (A) NEGATIVE Final    Comment: RESULTS REPORTED TO RN BEABRAUT 08/28/21 AT 1120 BY NM   C Diff toxin NEGATIVE NEGATIVE Final   C Diff interpretation Results are indeterminate. See PCR results.  Final    Comment: Performed at [facility], [place] [address], [place], [place] [address]  C. Diff by PCR, Reflexed     Status: Abnormal   Collection Time: 08/28/21 10:25 AM  Result Value Ref Range Status   Toxigenic C. Difficile by PCR POSITIVE (A) NEGATIVE Final    Comment: Positive for toxigenic C. difficile with little to no toxin production. Only treat if clinical presentation suggests symptomatic illness. Performed at [facility], [place] [address] [place], [place] [address]     Pertinent Lab. CBC Latest Ref Rng & Units 08/30/2021 08/29/2021 08/28/2021  WBC 4.0 - 10.5 K/uL 6.3 6.8 -  Hemoglobin 12.0 - 15.0 g/dL 10.7(L) 9.9(L) 9.7(L)  Hematocrit 36.0 - 46.0 % 31.3(L) 29.6(L) 29.0(L)  Platelets 150 - 400 K/uL 164 191 -   CMP Latest Ref Rng & Units 08/30/2021 08/29/2021 08/28/2021  Glucose 70 - 99 mg/dL 88 108(H) 43(LL)  BUN 6 - 20 mg/dL 40(H) 43(H) 48(H)  Creatinine 0.44 - 1.00 mg/dL 4.22(H) 4.14(H) 4.26(H)  Sodium 135 - 145 mmol/L 127(L) 129(L) 132(L)  Potassium 3.5 - 5.1 mmol/L 3.5 3.1(L) 3.7  Chloride 98 - 111 mmol/L 94(L) 96(L) 99  CO2 22 - 32 mmol/L 18(L) 17(L) 21(L)  Calcium 8.9 - 10.3 mg/dL 8.7(L) 8.3(L) 8.4(L)  Total Protein 6.5 - 8.1 g/dL - - 4.5(L)  Total Bilirubin 0.3 - 1.2 mg/dL - - 0.2(L)  Alkaline Phos 38 - 126 U/L - - 100  AST 15 - 41 U/L - - 11(L)  ALT 0 - 44 U/L - - 8     Pertinent Imaging today Plain films and CT images have been personally visualized and interpreted; radiology reports have been reviewed. Decision making incorporated into the Impression / Recommendations.  I spent more than 40 minutes for this patient encounter including review of prior medical records, coordination of care  with greater than 50% of time being face to face/counseling and discussing diagnostics/treatment plan with the patient/family.  Electronically signed by:   Rosiland Oz, MD Infectious Disease Physician [facility] Pager: [phone]

## 2021-08-30 NOTE — Progress Notes (Signed)
Pharmacy Antibiotic Note  Jane [name] is a 55 y.o. female admitted on 08/26/2021 with pneumonia.  Pharmacy has been consulted for vancomycin dosing. Patient is on continuous PD with 4 exchanges of 2 L and tolerating well. Blood and peritoneal fluid cultures positive for MRSE. Vancomycin random 19 ug/mL within range of 15-20 ug/mL after 72h after loading dose.  Plan: Vancomycin 1000 mg IV q72h. Follow-up vanc level as needed Monitor clinical improvement, renal function, and f/u cultures.  Height: 5\' 4"  (162.6 cm) Weight: 64.1 kg (141 lb 5 oz) IBW/kg (Calculated) : 54.7  Temp (24hrs), Avg:97.8 F (36.6 C), Min:97.4 F (36.3 C), Max:98.2 F (36.8 C)  Recent Labs  Lab 08/26/21 2229 08/26/21 2348 08/27/21 0121 08/27/21 0735 08/27/21 1109 08/28/21 0250 08/29/21 0245 08/30/21 0442  WBC 10.1  --   --  9.5  --  5.0 6.8 6.3  CREATININE 4.80*  --   --  4.65*  --  4.26* 4.14* 4.22*  LATICACIDVEN  --  3.4* 3.0*  --  1.0  --   --   --   VANCORANDOM  --   --   --   --   --   --   --  19    Estimated Creatinine Clearance: 13.2 mL/min (A) (by C-G formula based on SCr of 4.22 mg/dL (H)).    Allergies  Allergen Reactions   Icodextrin Rash    Antimicrobials this admission: Cefepime 11/5>>11/7 Vanco 11/5>> Metronid 11/5>>11/7 Unasyn 11/7>  Dose adjustments this admission: None.  Microbiology results: 11/5 Bcx: MRSE 11/5 Pleural fluid: MRSE 11/5 salmonella not detected   Thank you for allowing pharmacy to be a part of this patient's care.  Varney Daily, PharmD PGY1 Pharmacy Resident  Please check AMION for all [facility] pharmacy phone numbers After 10:00 PM call main pharmacy [phone]

## 2021-08-31 ENCOUNTER — Inpatient Hospital Stay (HOSPITAL_COMMUNITY): Payer: Medicare Other

## 2021-08-31 DIAGNOSIS — A419 Sepsis, unspecified organism: Secondary | ICD-10-CM | POA: Diagnosis not present

## 2021-08-31 DIAGNOSIS — T8571XD Infection and inflammatory reaction due to peritoneal dialysis catheter, subsequent encounter: Secondary | ICD-10-CM | POA: Diagnosis not present

## 2021-08-31 DIAGNOSIS — R7881 Bacteremia: Secondary | ICD-10-CM | POA: Diagnosis not present

## 2021-08-31 DIAGNOSIS — J690 Pneumonitis due to inhalation of food and vomit: Secondary | ICD-10-CM | POA: Diagnosis not present

## 2021-08-31 DIAGNOSIS — Z221 Carrier of other intestinal infectious diseases: Secondary | ICD-10-CM | POA: Diagnosis not present

## 2021-08-31 LAB — CBC
HCT: 31.2 % — ABNORMAL LOW (ref 36.0–46.0)
Hemoglobin: 10.2 g/dL — ABNORMAL LOW (ref 12.0–15.0)
MCH: 27.7 pg (ref 26.0–34.0)
MCHC: 32.7 g/dL (ref 30.0–36.0)
MCV: 84.8 fL (ref 80.0–100.0)
Platelets: 160 10*3/uL (ref 150–400)
RBC: 3.68 MIL/uL — ABNORMAL LOW (ref 3.87–5.11)
RDW: 23.8 % — ABNORMAL HIGH (ref 11.5–15.5)
WBC: 4.5 10*3/uL (ref 4.0–10.5)
nRBC: 0 % (ref 0.0–0.2)

## 2021-08-31 LAB — CULTURE, BLOOD (ROUTINE X 2)

## 2021-08-31 LAB — PHOSPHORUS
Phosphorus: 3.8 mg/dL (ref 2.5–4.6)
Phosphorus: 4.2 mg/dL (ref 2.5–4.6)
Phosphorus: 4.1 mg/dL (ref 2.5–4.6)

## 2021-08-31 LAB — BASIC METABOLIC PANEL
Anion gap: 16 — ABNORMAL HIGH (ref 5–15)
BUN: 36 mg/dL — ABNORMAL HIGH (ref 6–20)
CO2: 18 mmol/L — ABNORMAL LOW (ref 22–32)
Calcium: 8.8 mg/dL — ABNORMAL LOW (ref 8.9–10.3)
Chloride: 93 mmol/L — ABNORMAL LOW (ref 98–111)
Creatinine, Ser: 4.2 mg/dL — ABNORMAL HIGH (ref 0.44–1.00)
GFR, Estimated: 12 mL/min — ABNORMAL LOW (ref >60)
Glucose, Bld: 98 mg/dL (ref 70–99)
Potassium: 3.3 mmol/L — ABNORMAL LOW (ref 3.5–5.1)
Sodium: 127 mmol/L — ABNORMAL LOW (ref 135–145)

## 2021-08-31 LAB — GLUCOSE, CAPILLARY
Glucose-Capillary: 104 mg/dL — ABNORMAL HIGH (ref 70–99)
Glucose-Capillary: 107 mg/dL — ABNORMAL HIGH (ref 70–99)
Glucose-Capillary: 115 mg/dL — ABNORMAL HIGH (ref 70–99)
Glucose-Capillary: 140 mg/dL — ABNORMAL HIGH (ref 70–99)
Glucose-Capillary: 59 mg/dL — ABNORMAL LOW (ref 70–99)
Glucose-Capillary: 59 mg/dL — ABNORMAL LOW (ref 70–99)
Glucose-Capillary: 64 mg/dL — ABNORMAL LOW (ref 70–99)
Glucose-Capillary: 69 mg/dL — ABNORMAL LOW (ref 70–99)
Glucose-Capillary: 94 mg/dL (ref 70–99)

## 2021-08-31 LAB — MAGNESIUM
Magnesium: 2.2 mg/dL (ref 1.7–2.4)
Magnesium: 2.1 mg/dL (ref 1.7–2.4)
Magnesium: 2.1 mg/dL (ref 1.7–2.4)

## 2021-08-31 MED ORDER — SODIUM CHLORIDE 1 G PO TABS
1.0000 g | ORAL_TABLET | Freq: Two times a day (BID) | ORAL | Status: AC
  Administered 2021-08-31 – 2021-09-01 (×3): 1 g
  Filled 2021-08-31 (×3): qty 1

## 2021-08-31 MED ORDER — DEXTROSE 50 % IV SOLN
12.5000 g | INTRAVENOUS | Status: AC
  Administered 2021-08-31: 12.5 g via INTRAVENOUS

## 2021-08-31 MED ORDER — VITAL 1.5 CAL PO LIQD
1000.0000 mL | ORAL | Status: DC
  Administered 2021-08-31: 1000 mL

## 2021-08-31 MED ORDER — BENZTROPINE MESYLATE 1 MG PO TABS
1.0000 mg | ORAL_TABLET | Freq: Every day | ORAL | Status: DC
  Administered 2021-08-31 – 2021-09-10 (×11): 1 mg
  Filled 2021-08-31 (×13): qty 1

## 2021-08-31 MED ORDER — RENA-VITE PO TABS
1.0000 | ORAL_TABLET | Freq: Every day | ORAL | Status: DC
  Administered 2021-08-31 – 2021-09-04 (×5): 1
  Filled 2021-08-31 (×5): qty 1

## 2021-08-31 MED ORDER — NYSTATIN 100000 UNIT/ML MT SUSP
5.0000 mL | Freq: Four times a day (QID) | OROMUCOSAL | Status: DC
  Administered 2021-08-31 – 2021-09-09 (×33): 500000 [IU]
  Filled 2021-08-31 (×33): qty 5

## 2021-08-31 MED ORDER — DEXTROSE 50 % IV SOLN
INTRAVENOUS | Status: AC
  Filled 2021-08-31: qty 50

## 2021-08-31 MED ORDER — VITAL HIGH PROTEIN PO LIQD: 1000.0000 mL | ORAL | Status: DC

## 2021-08-31 MED ORDER — LEVETIRACETAM 100 MG/ML PO SOLN
500.0000 mg | Freq: Two times a day (BID) | ORAL | Status: DC
  Administered 2021-08-31 – 2021-09-11 (×22): 500 mg
  Filled 2021-08-31 (×24): qty 5

## 2021-08-31 MED ORDER — POTASSIUM CHLORIDE CRYS ER 20 MEQ PO TBCR
20.0000 meq | EXTENDED_RELEASE_TABLET | Freq: Once | ORAL | Status: AC
  Administered 2021-08-31: 20 meq via ORAL
  Filled 2021-08-31: qty 1

## 2021-08-31 MED ORDER — POTASSIUM CHLORIDE 10 MEQ/100ML IV SOLN
10.0000 meq | INTRAVENOUS | Status: AC
  Administered 2021-08-31 (×2): 10 meq via INTRAVENOUS
  Filled 2021-08-31 (×3): qty 100

## 2021-08-31 MED ORDER — SODIUM CHLORIDE 1 G PO TABS
1.0000 g | ORAL_TABLET | Freq: Two times a day (BID) | ORAL | Status: DC
  Administered 2021-08-31: 1 g via ORAL
  Filled 2021-08-31 (×2): qty 1

## 2021-08-31 MED ORDER — VALPROIC ACID 250 MG/5ML PO SOLN
500.0000 mg | Freq: Two times a day (BID) | ORAL | Status: DC
  Administered 2021-08-31 – 2021-09-11 (×22): 500 mg
  Filled 2021-08-31 (×21): qty 10

## 2021-08-31 MED ORDER — ALBUMIN HUMAN 25 % IV SOLN: 25.0000 g | Freq: Four times a day (QID) | INTRAVENOUS | Status: DC

## 2021-08-31 MED ORDER — LEVOTHYROXINE SODIUM 50 MCG PO TABS
50.0000 ug | ORAL_TABLET | Freq: Every day | ORAL | Status: DC
  Administered 2021-09-01 – 2021-09-11 (×11): 50 ug
  Filled 2021-08-31 (×12): qty 1

## 2021-08-31 MED ORDER — FLUOXETINE HCL 20 MG PO CAPS
20.0000 mg | ORAL_CAPSULE | Freq: Every day | ORAL | Status: DC
  Administered 2021-09-01 – 2021-09-11 (×11): 20 mg
  Filled 2021-08-31 (×11): qty 1

## 2021-08-31 MED ORDER — RISPERIDONE 3 MG PO TABS
3.0000 mg | ORAL_TABLET | Freq: Every day | ORAL | Status: DC
  Administered 2021-08-31 – 2021-09-10 (×11): 3 mg
  Filled 2021-08-31: qty 6
  Filled 2021-08-31 (×13): qty 1

## 2021-08-31 MED ORDER — DELFLEX-LC/1.5% DEXTROSE 344 MOSM/L IP SOLN: INTRAPERITONEAL | Status: DC

## 2021-08-31 MED ORDER — PANTOPRAZOLE 2 MG/ML SUSPENSION
40.0000 mg | Freq: Every day | ORAL | Status: DC
  Administered 2021-09-01 – 2021-09-11 (×11): 40 mg
  Filled 2021-08-31 (×13): qty 20

## 2021-08-31 MED ORDER — NUTRISOURCE FIBER PO PACK
1.0000 | PACK | Freq: Two times a day (BID) | ORAL | Status: DC
  Administered 2021-08-31 – 2021-09-01 (×3): 1
  Filled 2021-08-31 (×4): qty 1

## 2021-08-31 MED ORDER — PROSOURCE TF PO LIQD
45.0000 mL | Freq: Two times a day (BID) | ORAL | Status: DC
  Administered 2021-08-31 – 2021-09-05 (×11): 45 mL
  Filled 2021-08-31 (×11): qty 45

## 2021-08-31 MED ORDER — POTASSIUM CHLORIDE 20 MEQ PO PACK
20.0000 meq | PACK | Freq: Once | ORAL | Status: AC
  Administered 2021-08-31: 20 meq
  Filled 2021-08-31: qty 1

## 2021-08-31 MED ORDER — MIDODRINE HCL 5 MG PO TABS
5.0000 mg | ORAL_TABLET | Freq: Three times a day (TID) | ORAL | Status: DC
  Administered 2021-08-31 – 2021-09-02 (×7): 5 mg
  Filled 2021-08-31 (×7): qty 1

## 2021-08-31 NOTE — Progress Notes (Signed)
[facility] Infectious Diseases Follow Up Note  Patient Identification: Patient Name: Jane [name] MRN: [id number] [place] Date: 08/26/2021 10:15 PM Age: 55 y.o.Today's Date: 08/31/2021   Reason for Visit: Peritonitis   Principal Problem:   Sepsis [facility]) Active Problems:   Schizoaffective disorder ([place])   End-stage renal disease on peritoneal dialysis [facility])   Seizure disorder ([place])   Aspiration pneumonia of both lower lobes due to gastric secretions ([facility])   Septic shock ([place])   Dialysis-associated peritonitis ([place])   Staphylococcus epidermidis bacteremia   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 No evidence of exit site/tunnel infection   ? Aspiration PNA - on room air, on Unasyn   C diff ag positive but toxin negative: Indeterminate results, 2 soft stools yesterday and none overnight   MRSE  and GPR 1/2 sets: These are likely contaminants with 2 different organisms isolated from single set. MRSE isolated in blood is not concordant with the one isolated from the Peritoneal fluid  H/o discitis and epidural abscess s/p laminectomy in 01/25/21( MSSE) and completion of 8 weeks of abtx ( IV and PO) Anemia - FOBT+, Transfusion needs and work up per primary and GI Electrolyte abnormalities ( hypokalemia, hyponatremia) : replacement per Nephro and primary Hypotension- on midodrine  Schizoaffective d/o and Bipolar    Recommendations Continue Vancomycin, dosing per pharmacy. Duration 2 weeks total  Complete 5 days course of Unasyn for ? Aspiration PNA in the  setting of being a c diff carrier 3.   Monitor CBC, BMP and Vancomycin trough  4.   ID will sign off. Please call with questions 5.   Discussed with Dr Carmelina Dane  Rest of the management as per the primary team. Thank you for the consult. Please page with pertinent questions or concerns.  ______________________________________________________________________ Subjective patient seen and examined at the bedside. She is getting her HD.   Vitals BP (!) 83/54   Pulse 88   Temp (!) 97.4 F (36.3 C) (Oral)   Resp (!) 29   Ht 5\' 4"  (1.626 m)   Wt 65.5 kg   SpO2 97%   BMI 24.79 kg/m     Physical Exam Constitutional:  resting in bed comfortably    Comments:   Cardiovascular:     Rate and Rhythm: Normal rate and regular rhythm.     Heart sounds:  Pulmonary:     Effort: Pulmonary effort is normal.     Comments:   Abdominal:     Palpations: Abdomen is soft.     Tenderness: Non tender and non distended, PD catheter site is bandaged   Musculoskeletal:        General: No swelling or tenderness.   Skin:    Comments: No obvious lesions or rashes   Neurological:  General: moves all extremities   Psychiatric:        Mood and Affect: difficult to assess    Pertinent Microbiology Results for orders placed or performed during the hospital encounter of 08/26/21  Resp Panel by RT-PCR (Flu A&B, Covid) Nasopharyngeal Swab     Status: None   Collection Time: 08/26/21 10:54 PM   Specimen: Nasopharyngeal Swab; Nasopharyngeal(NP) swabs in vial transport medium  Result Value Ref Range Status   SARS Coronavirus 2 by RT PCR NEGATIVE NEGATIVE Final    Comment: (NOTE) SARS-CoV-2 target nucleic acids are NOT DETECTED.  The SARS-CoV-2 RNA is generally detectable in upper respiratory specimens during the acute phase of infection. The lowest concentration of SARS-CoV-2 viral copies this assay can detect is 138 copies/mL. A negative result does not preclude SARS-Cov-2 infection and should  not be used as the sole basis for treatment or other patient management decisions. A negative result may occur with  improper specimen collection/handling, submission of specimen other than nasopharyngeal swab, presence of viral mutation(s) within the areas targeted by this assay, and inadequate number of viral copies(<138 copies/mL). A negative result must be combined with clinical observations, patient history, and epidemiological information. The expected result is Negative.  Fact Sheet for Patients:  EntrepreneurPulse.com.au  Fact Sheet for Healthcare Providers:  IncredibleEmployment.be  This test is no t yet approved or cleared by the [place] FDA and  has been authorized for detection and/or diagnosis of SARS-CoV-2 by FDA under an Emergency Use Authorization (EUA). This EUA will remain  in effect (meaning this test can be used) for the duration of the COVID-19 declaration under Section 564(b)(1) of the Act, 21 U.S.C.section 360bbb-3(b)(1), unless the authorization is terminated  or revoked sooner.       Influenza A by PCR NEGATIVE NEGATIVE Final   Influenza B by PCR NEGATIVE NEGATIVE Final    Comment: (NOTE) The Xpert Xpress SARS-CoV-2/FLU/RSV plus assay is intended as an aid in the diagnosis of influenza from Nasopharyngeal swab specimens and should not be used as a sole basis for treatment. Nasal washings and aspirates are unacceptable for Xpert Xpress SARS-CoV-2/FLU/RSV testing.  Fact Sheet for Patients: EntrepreneurPulse.com.au  Fact Sheet for Healthcare Providers: IncredibleEmployment.be  This test is not yet approved or cleared by the [place] FDA and has been authorized for detection and/or diagnosis of SARS-CoV-2 by FDA under an Emergency Use Authorization (EUA). This EUA will remain in effect (meaning this test can be used) for the duration of the COVID-19 declaration under  Section 564(b)(1) of the Act, 21 U.S.C. section 360bbb-3(b)(1), unless the authorization is terminated or revoked.  Performed at [facility], [place] [address] [place], [place] [address]   Blood culture (routine x 2)     Status: Abnormal (Preliminary result)   Collection Time: 08/27/21  2:00 AM   Specimen: BLOOD  Result Value Ref Range Status   Specimen Description BLOOD RIGHT ARM  Final   Special Requests   Final    BOTTLES DRAWN AEROBIC AND ANAEROBIC Blood Culture results may not be optimal due to an excessive volume of blood received in culture bottles   Culture  Setup Time   Final    GRAM POSITIVE COCCI AEROBIC BOTTLE ONLY CRITICAL RESULT CALLED TO, READ BACK BY AND VERIFIED WITH: PHARMD GREG ABBOTT 08/28/21@00 :50 BY TW IN BOTH AEROBIC AND ANAEROBIC BOTTLES Performed at [facility], [place] [address], [place], [place] [address]    Culture STAPHYLOCOCCUS EPIDERMIDIS GRAM POSITIVE RODS  (  A)  Final   Report Status PENDING  Incomplete   Organism ID, Bacteria STAPHYLOCOCCUS EPIDERMIDIS  Final      Susceptibility   Staphylococcus epidermidis - MIC*    CIPROFLOXACIN 1 SENSITIVE Sensitive     ERYTHROMYCIN <=0.25 SENSITIVE Sensitive     GENTAMICIN 8 INTERMEDIATE Intermediate     OXACILLIN >=4 RESISTANT Resistant     TETRACYCLINE 2 SENSITIVE Sensitive     VANCOMYCIN 2 SENSITIVE Sensitive     TRIMETH/SULFA 160 RESISTANT Resistant     CLINDAMYCIN <=0.25 SENSITIVE Sensitive     RIFAMPIN <=0.5 SENSITIVE Sensitive     Inducible Clindamycin NEGATIVE Sensitive     * STAPHYLOCOCCUS EPIDERMIDIS  Blood Culture ID Panel (Reflexed)     Status: Abnormal   Collection Time: 08/27/21  2:00 AM  Result Value Ref Range Status   Enterococcus faecalis NOT DETECTED NOT DETECTED Final   Enterococcus Faecium NOT DETECTED NOT DETECTED Final   Listeria monocytogenes NOT DETECTED NOT DETECTED Final   Staphylococcus species DETECTED (A) NOT DETECTED Final    Comment: CRITICAL RESULT CALLED TO,  READ BACK BY AND VERIFIED WITH: PHARMD GREG ABBOTT 08/28/21@00 :50 BY TW    Staphylococcus aureus (BCID) NOT DETECTED NOT DETECTED Final   Staphylococcus epidermidis DETECTED (A) NOT DETECTED Final    Comment: Methicillin (oxacillin) resistant coagulase negative staphylococcus. Possible blood culture contaminant (unless isolated from more than one blood culture draw or clinical case suggests pathogenicity). No antibiotic treatment is indicated for blood  culture contaminants. CRITICAL RESULT CALLED TO, READ BACK BY AND VERIFIED WITH: PHARMD GREG ABBOTT 08/28/21@00 :50 BY TW    Staphylococcus lugdunensis NOT DETECTED NOT DETECTED Final   Streptococcus species NOT DETECTED NOT DETECTED Final   Streptococcus agalactiae NOT DETECTED NOT DETECTED Final   Streptococcus pneumoniae NOT DETECTED NOT DETECTED Final   Streptococcus pyogenes NOT DETECTED NOT DETECTED Final   A.calcoaceticus-baumannii NOT DETECTED NOT DETECTED Final   Bacteroides fragilis NOT DETECTED NOT DETECTED Final   Enterobacterales NOT DETECTED NOT DETECTED Final   Enterobacter cloacae complex NOT DETECTED NOT DETECTED Final   Escherichia coli NOT DETECTED NOT DETECTED Final   Klebsiella aerogenes NOT DETECTED NOT DETECTED Final   Klebsiella oxytoca NOT DETECTED NOT DETECTED Final   Klebsiella pneumoniae NOT DETECTED NOT DETECTED Final   Proteus species NOT DETECTED NOT DETECTED Final   Salmonella species NOT DETECTED NOT DETECTED Final   Serratia marcescens NOT DETECTED NOT DETECTED Final   Haemophilus influenzae NOT DETECTED NOT DETECTED Final   Neisseria meningitidis NOT DETECTED NOT DETECTED Final   Pseudomonas aeruginosa NOT DETECTED NOT DETECTED Final   Stenotrophomonas maltophilia NOT DETECTED NOT DETECTED Final   Candida albicans NOT DETECTED NOT DETECTED Final   Candida auris NOT DETECTED NOT DETECTED Final   Candida glabrata NOT DETECTED NOT DETECTED Final   Candida krusei NOT DETECTED NOT DETECTED Final   Candida  parapsilosis NOT DETECTED NOT DETECTED Final   Candida tropicalis NOT DETECTED NOT DETECTED Final   Cryptococcus neoformans/gattii NOT DETECTED NOT DETECTED Final   Methicillin resistance mecA/C DETECTED (A) NOT DETECTED Final    Comment: CRITICAL RESULT CALLED TO, READ BACK BY AND VERIFIED WITH: PHARMD GREG ABBOTT 08/28/21@00 :50 BY TW Performed at [facility], [address] [place], [place] [address]   MRSA Next Gen by PCR, Nasal     Status: None   Collection Time: 08/27/21  3:29 AM   Specimen: Nasal Mucosa; Nasal Swab  Result Value Ref Range Status   MRSA by  PCR Next Gen NOT DETECTED NOT DETECTED Final    Comment: (NOTE) The GeneXpert MRSA Assay (FDA approved for NASAL specimens only), is one component of a comprehensive MRSA colonization surveillance program. It is not intended to diagnose MRSA infection nor to guide or monitor treatment for MRSA infections. Test performance is not FDA approved in patients less than 27 years old. Performed at [facility], [place] [address] [place], [place] [address]   Blood culture (routine x 2)     Status: None (Preliminary result)   Collection Time: 08/27/21  7:57 AM   Specimen: BLOOD RIGHT FOREARM  Result Value Ref Range Status   Specimen Description BLOOD RIGHT FOREARM  Final   Special Requests   Final    BOTTLES DRAWN AEROBIC AND ANAEROBIC Blood Culture adequate volume   Culture   Final    NO GROWTH 3 DAYS Performed at [facility], [place] [address], [place], [place] [address]    Report Status PENDING  Incomplete  Body fluid culture w Gram Stain     Status: None   Collection Time: 08/27/21  2:16 PM   Specimen: Peritoneal Washings; Body Fluid  Result Value Ref Range Status   Specimen Description PERITONEAL FLUID  Final   Special Requests NONE  Final   Gram Stain   Final    WBC PRESENT,BOTH PMN AND MONONUCLEAR GRAM POSITIVE COCCI CYTOSPIN SMEAR Performed at [facility], [address], [place], [place]  [address]    Culture ABUNDANT STAPHYLOCOCCUS EPIDERMIDIS  Final   Report Status 08/30/2021 FINAL  Final   Organism ID, Bacteria STAPHYLOCOCCUS EPIDERMIDIS  Final      Susceptibility   Staphylococcus epidermidis - MIC*    CIPROFLOXACIN 2 INTERMEDIATE Intermediate     ERYTHROMYCIN <=0.25 SENSITIVE Sensitive     GENTAMICIN 4 SENSITIVE Sensitive     OXACILLIN >=4 RESISTANT Resistant     TETRACYCLINE <=1 SENSITIVE Sensitive     VANCOMYCIN 1 SENSITIVE Sensitive     TRIMETH/SULFA 20 SENSITIVE Sensitive     CLINDAMYCIN <=0.25 SENSITIVE Sensitive     RIFAMPIN <=0.5 SENSITIVE Sensitive     Inducible Clindamycin NEGATIVE Sensitive     * ABUNDANT STAPHYLOCOCCUS EPIDERMIDIS  C Difficile Quick Screen w PCR reflex     Status: Abnormal   Collection Time: 08/28/21 10:25 AM   Specimen: STOOL  Result Value Ref Range Status   C Diff antigen POSITIVE (A) NEGATIVE Final    Comment: RESULTS REPORTED TO RN BEABRAUT 08/28/21 AT 1120 BY NM   C Diff toxin NEGATIVE NEGATIVE Final   C Diff interpretation Results are indeterminate. See PCR results.  Final    Comment: Performed at [facility], [place] [address], [place], [place] [address]  C. Diff by PCR, Reflexed     Status: Abnormal   Collection Time: 08/28/21 10:25 AM  Result Value Ref Range Status   Toxigenic C. Difficile by PCR POSITIVE (A) NEGATIVE Final    Comment: Positive for toxigenic C. difficile with little to no toxin production. Only treat if clinical presentation suggests symptomatic illness. Performed at [facility], [place] [address], [place], [place] [address]   Culture, blood (routine x 2)     Status: None (Preliminary result)   Collection Time: 08/29/21 12:24 PM   Specimen: BLOOD LEFT HAND  Result Value Ref Range Status   Specimen Description BLOOD LEFT HAND  Final   Special Requests   Final    BOTTLES DRAWN  AEROBIC AND ANAEROBIC Blood Culture adequate volume   Culture   Final    NO GROWTH < 24 HOURS Performed at [facility], [place] [address] [place], [place] [address]    Report Status PENDING  Incomplete  Culture, blood (routine x 2)     Status: None (Preliminary result)   Collection Time: 08/29/21 12:33 PM   Specimen: BLOOD RIGHT HAND  Result Value Ref Range Status   Specimen Description BLOOD RIGHT HAND  Final   Special Requests   Final    BOTTLES DRAWN AEROBIC AND ANAEROBIC Blood Culture adequate volume   Culture   Final    NO GROWTH < 24 HOURS Performed at [facility], [place] [address] [place], [place] [address]    Report Status PENDING  Incomplete    Pertinent Lab. CBC Latest Ref Rng & Units 08/31/2021 08/30/2021 08/29/2021  WBC 4.0 - 10.5 K/uL 4.5 6.3 6.8  Hemoglobin 12.0 - 15.0 g/dL 10.2(L) 10.7(L) 9.9(L)  Hematocrit 36.0 - 46.0 % 31.2(L) 31.3(L) 29.6(L)  Platelets 150 - 400 K/uL 160 164 191   CMP Latest Ref Rng & Units 08/31/2021 08/30/2021 08/29/2021  Glucose 70 - 99 mg/dL 98 88 108(H)  BUN 6 - 20 mg/dL 36(H) 40(H) 43(H)  Creatinine 0.44 - 1.00 mg/dL 4.20(H) 4.22(H) 4.14(H)  Sodium 135 - 145 mmol/L 127(L) 127(L) 129(L)  Potassium 3.5 - 5.1 mmol/L 3.3(L) 3.5 3.1(L)  Chloride 98 - 111 mmol/L 93(L) 94(L) 96(L)  CO2 22 - 32 mmol/L 18(L) 18(L) 17(L)  Calcium 8.9 - 10.3 mg/dL 8.8(L) 8.7(L) 8.3(L)  Total Protein 6.5 - 8.1 g/dL - - -  Total Bilirubin 0.3 - 1.2 mg/dL - - -  Alkaline Phos 38 - 126 U/L - - -  AST 15 - 41 U/L - - -  ALT 0 - 44 U/L - - -     Pertinent Imaging today Plain films and CT images have been personally visualized and interpreted; radiology reports have been reviewed. Decision making incorporated into the Impression / Recommendations.  I spent more than 40 minutes for this patient encounter including review of prior medical records, coordination of care  with greater than 50% of time being face to face/counseling and discussing diagnostics/treatment plan with the patient/family.  Electronically signed by:   Rosiland Oz, MD Infectious Disease  Physician [facility] for Infectious Disease Pager: [phone]

## 2021-08-31 NOTE — Progress Notes (Signed)
[facility] Progress Note   Subjective:   Not offering any complaints this morning. Per PD RN UF 351mL.   Objective Vitals:   08/31/21 0530 08/31/21 0600 08/31/21 0738 08/31/21 0823  BP: (!) 86/55 (!) 83/54  104/70  Pulse: 87 88  (!) 103  Resp: (!) 21 (!) 29  18  Temp:   (!) 97.4 F (36.3 C) (!) 97.3 F (36.3 C)  TempSrc:   Oral Axillary  SpO2: 95% 97%  95%  Weight:      Height:         Additional Objective Labs: Basic Metabolic Panel: Recent Labs  Lab 08/29/21 0245 08/30/21 0442 08/31/21 0311  NA 129* 127* 127*  K 3.1* 3.5 3.3*  CL 96* 94* 93*  CO2 17* 18* 18*  GLUCOSE 108* 88 98  BUN 43* 40* 36*  CREATININE 4.14* 4.22* 4.20*  CALCIUM 8.3* 8.7* 8.8*  PHOS 3.8 4.0 3.8    CBC: Recent Labs  Lab 08/26/21 2229 08/27/21 0735 08/28/21 0250 08/28/21 0435 08/29/21 0245 08/30/21 0442 08/31/21 0311  WBC 10.1 9.5 5.0  --  6.8 6.3 4.5  NEUTROABS 8.1*  --   --   --   --   --   --   HGB 10.3* 8.8* 6.0*   < > 9.9* 10.7* 10.2*  HCT 32.6* 26.9* 18.7*   < > 29.6* 31.3* 31.2*  MCV 91.8 92.1 93.0  --  83.9 82.8 84.8  PLT 369 283 188  --  191 164 160   < > = values in this interval not displayed.    Blood Culture    Component Value Date/Time   SDES BLOOD RIGHT HAND 08/29/2021 1233   SPECREQUEST  08/29/2021 1233    BOTTLES DRAWN AEROBIC AND ANAEROBIC Blood Culture adequate volume   CULT  08/29/2021 1233    NO GROWTH 2 DAYS Performed at [facility], [place] [address] [place], [place] [address]    REPTSTATUS PENDING 08/29/2021 1233     Physical Exam General: Alert, lying on side, nad  Heart: RRR No m,r,g Lungs: Clear bilaterally  Abdomen: soft, nontender today Extremities: trace LE edema bilaterally Dialysis Access: PD cath in place   Medications:  sodium chloride 10 mL/hr at 08/30/21 1600   ampicillin-sulbactam (UNASYN) IV 3 g (08/30/21 2115)   dialysis solution 1.5% low-MG/low-CA     potassium chloride 10 mEq (08/31/21 1021)    vancomycin Stopped (08/30/21 1253)    benztropine  1 mg Oral QHS   Chlorhexidine Gluconate Cloth  6 each Topical Daily   divalproex  500 mg Oral BID   feeding supplement (PROSource TF)  45 mL Per Tube BID   feeding supplement (VITAL HIGH PROTEIN)  1,000 mL Per Tube Q24H   fiber  1 packet Per Tube BID   FLUoxetine  20 mg Oral Daily   gentamicin cream  1 application Topical Daily   heparin injection (subcutaneous)  5,000 Units Subcutaneous Q8H   levETIRAcetam  500 mg Oral Daily   levothyroxine  50 mcg Oral Q breakfast   midodrine  5 mg Oral TID WC   nystatin  5 mL Oral QID   pantoprazole  40 mg Oral Daily   risperiDONE  3 mg Oral QHS   sodium chloride flush  3 mL Intravenous Q12H   sodium chloride  1 g Oral BID WC    Dialysis Orders:  [facility]  [facility] [phone] (HT not open on weekends)  OP Nephrologist: Jeneen Rinks  [place]  Ambulatory PD  7x/week  4 exchanges 2L    Assessment/Plan: Sepsis --Secondary to  aspiration PNA and peritonitis. +bld cx thought to be contaminant.   ID following - vanc +unasyn IV now.  PD fluid cell count after 48h therapy was improving and no e/o tunnel infection on exam.  Cont current therapy but add fungal peritonitis prophylaxis with nystatin po QID.  Plans 2 weeks vanc and 5d unasyn.  Hypotension/volume  - Hypotensive 2/2 shock. S/p volume resuscitation. Pressor support on admission. Midodrine started. BPs are at baseline now.  ESRD -  Will continue CCPD tonight - use 1.5% tonight Anemia  - Hgb 8.8 >6.0 > 9s> 10s.    ?GI bleed. Hemoccult pending.  Metabolic bone disease -  Corr Ca/Phos ok.  Nutrition - Alb low.  Prot supp when taking PO Schizoaffective/Bipolar disorder - per primary  Hypokalemia - repleted.  Hyponatremia - chronic mild hyponatremia.  Stable at 127.  On NaCl tabs and will start tube feeds today.  She doesn't appear to have any po intake really and I'll switch to all 1.5% tonight.   Spoke to RN at her dialysis clinic - has 2  family members (sister and BIL) who do her PD treatments for her and have been providing essentially total care to her recently.  Her BP chronically runs on the low side.  Her current clinical status doesn't seem far off her baseline actually, so I think it's reasonable to continue her PD.  I spoke to [name]  her sister who agrees she and husband can continue the current level of care for Zianna and would prefer not to switch to HD.  Ongoing discussion as course progresses.   Jannifer Hick MD [facility] Pager [phone]

## 2021-08-31 NOTE — Progress Notes (Signed)
eLink Physician-Brief Progress Note Patient Name: FLYNN LININGER DOB: Oct 01, 1966 MRN: [id number]   Date of Service  08/31/2021  HPI/Events of Note  K+ 3.3, GFR 12  eICU Interventions  KCL 20 meq po x 1 ordered.        Kerry Kass Meshach Perry 08/31/2021, 5:12 AM

## 2021-08-31 NOTE — Progress Notes (Signed)
Speech Language Pathology Treatment: Dysphagia  Patient Details Name: Jane [name] MRN: [id number] DOB: 1966-02-16 Today's Date: 08/31/2021 Time: 9381-0175 SLP Time Calculation (min) (ACUTE ONLY): 11 min  Assessment / Plan / Recommendation Clinical Impression  Pt was seen for dysphagia treatment. Pt's nurse, Apolonio Schneiders, reported that p.o. intake has been limited, but that the pt has been tolerating the limited amounts that she has taken without s/sx of aspiration. Pt required encouragement to accept boluses, and she ultimately only consumed a single ice chip without s/sx of aspiration, but all other solids and liquids were refused. Pt's current diet will be continued at this time and SLP will continue to follow.    HPI HPI: Pt is a 55 y.o. female who presented to the ED with a week of nausea, vomiting, diarrhea, and productive cough. Workup in ED c/w septic shock, possible aspiration PNA. PMH: schizoaffective disorder, seizures, ESRD on peritoneal dialysis, chronic systolic CHF, hypothyroidism,  intellectual disability, and discitis-osteomyelitis and epidural abscess in April 2022. BSE 02/04/21: limited due to pt's cooperation and lethargy, but no s/sx of aspiration noted with boluses of thin liquid of pudding.      SLP Plan  Continue with current plan of care      Recommendations for follow up therapy are one component of a multi-disciplinary discharge planning process, led by the attending physician.  Recommendations may be updated based on patient status, additional functional criteria and insurance authorization.    Recommendations  Diet recommendations: Dysphagia 2 (fine chop);Thin liquid Liquids provided via: Cup;Straw Medication Administration: Whole meds with liquid Supervision: Staff to assist with self feeding Compensations: Minimize environmental distractions Postural Changes and/or Swallow Maneuvers: Seated upright 90 degrees                Oral Care Recommendations:  Oral care BID Follow Up Recommendations:  (TBD) Assistance recommended at discharge: Frequent or constant Supervision/Assistance SLP Visit Diagnosis: Dysphagia, unspecified (R13.10) Plan: Continue with current plan of care       Faris Coolman I. Hardin Negus, [place], [place] Office number [phone] Pager [place]  08/31/2021, 2:12 PM

## 2021-08-31 NOTE — Progress Notes (Signed)
NAME:  Jane [name], MRN:  [id number], DOB:  11-12-1965, LOS: 4 ADMISSION DATE:  08/26/2021, CONSULTATION DATE:  08/27/21 REFERRING MD:  Aileen Fass, CHIEF COMPLAINT:  N/V   History of Present Illness:  55 year old woman with hx of ESRD on PD, schizoaffective disorder, prior discitis/osteomyelitis/epidural abscess earlier this year presenting with N/V/D, abd pain, and cough.  Workup in ER c/w septic shock, possible aspiration PNA, shock not responding to fluids, and persistent lactic acidemia for which PCCM is consulted.    Pertinent  Medical History  ESRD on PD followed by [facility] Cognitive impairment/ mental retardation Depression/Bipolar/schizophrenia/seizures (no AEDs) HTN HLD Hypothyroidism  Significant Hospital Events: Including procedures, antibiotic start and stop dates in addition to other pertinent events   11/5 admitted sepsis  11/5 CT abd/ pelvis > Bilateral lower lobe pneumonia or aspiration. Small ascites. Peritoneal dialysis catheter with tip in the right lower quadrant. Thickened appearance of the small bowel likely related to ascites. Enteritis is less likely. No bowel obstruction. Normal appendix. Cholelithiasis. Aortic Atherosclerosis  Interim History / Subjective:  Overnight with hypotension, continues on midodrine  Objective   Blood pressure 104/70, pulse (!) 103, temperature (!) 97.3 F (36.3 C), temperature source Axillary, resp. rate 18, height 5\' 4"  (1.626 m), weight 65.5 kg, SpO2 95 %.        Intake/Output Summary (Last 24 hours) at 08/31/2021 0950 Last data filed at 08/30/2021 1600 Gross per 24 hour  Intake 511.11 ml  Output --  Net 511.11 ml   Filed Weights   08/29/21 1930 08/30/21 1930 08/31/21 0500  Weight: 64.1 kg 64.5 kg 65.5 kg    Examination: General: Chronically ill-appearing adult female  HEENT: Dry MM  Neuro: awakens with physical stimulation, follows simple commands  CV: Irregular, HR 99  PULM: clear breath sounds, no use of  accessory muscles  GI: Bowel sounds appreciated, PD catheter in place Extremities: Lower extremity edema Skin: no rashes  Labs reviewed: H/H noted  Resolved Hospital Problem list   N/A  Assessment & Plan:   Septic shock, sources ddx aspiration pneumonitis, peritonitis/ SBP -cortisol 19.8 -MRSA and CPR 1/2  H/O discitis and epidural abscess s/p laminectomy in 01/25/21( MSSE) and completion of 8 weeks of abtx ( IV and PO) Plan - goal MAP > 65 - continue midodrine - ID following  - continue Unasyn for planned 5 days total, continue vancomycin for 2 weeks total   ESRD on PD Hyponatremia (in setting of d5 for hypoglycemia), some component of chronic last admission 128-132  Plan - Nephrology following - Continues on PD  - strict I/Os, daily wts - serial renal indices  - Started on Salt Tabs (1g BID for 4 doses)    Anemia, normocytic/chronic, baseline 7.5 -9  Thrombocytopenia suspect in setting of sepsis >  - Hgb 10.3 > 8.8 > 6 (repeated for verification) > finishing 2units PRBC now Plan - Trend CBC  - Transfuse for hemoglobin <7    Hypoglycemia, refractory in setting of sepsis > resolved  Plan - Trend glucose    Multiple cognitive and psychiatric comorbidities Plan - Continue home psych meds- cogentin, depakote, prozac, keppra, risperdal   Hx HTN, HF - recent TTE 06/06/21 at [facility], EF 35-40%, prolonged relaxation of LV filling pattern, normal RV, trace MR - not on any home antihypertensives Plan - monitor volume status closely, anuric/ volume removal w/PD, daily weights   Hypothyroidism  - TSH, 2.584 Plan -continue home levothyroxine   Best Practice (right  click and "Reselect all SmartList Selections" daily)   Diet/type: Dys 2 diet, poor oral intake, plans for replace cortrack to start TF  DVT prophylaxis: prophylactic heparin  GI prophylaxis:  PPI  Lines: PD catheter Foley:  N/A Code Status:  full code, confirmed with family 11/5 Last date of multidisciplinary  goals of care discussion.  11/5  Labs   CBC: Recent Labs  Lab 08/26/21 2229 08/27/21 0735 08/28/21 0250 08/28/21 0435 08/28/21 1251 08/29/21 0245 08/30/21 0442 08/31/21 0311  WBC 10.1 9.5 5.0  --   --  6.8 6.3 4.5  NEUTROABS 8.1*  --   --   --   --   --   --   --   HGB 10.3* 8.8* 6.0* 6.0* 9.7* 9.9* 10.7* 10.2*  HCT 32.6* 26.9* 18.7* 18.9* 29.0* 29.6* 31.3* 31.2*  MCV 91.8 92.1 93.0  --   --  83.9 82.8 84.8  PLT 369 283 188  --   --  191 164 629    Basic Metabolic Panel: Recent Labs  Lab 08/26/21 2348 08/27/21 0735 08/28/21 0250 08/29/21 0245 08/30/21 0442 08/31/21 0311  NA  --  132* 132* 129* 127* 127*  K  --  4.8 3.7 3.1* 3.5 3.3*  CL  --  98 99 96* 94* 93*  CO2  --  20* 21* 17* 18* 18*  GLUCOSE  --  46* 43* 108* 88 98  BUN  --  53* 48* 43* 40* 36*  CREATININE  --  4.65* 4.26* 4.14* 4.22* 4.20*  CALCIUM  --  7.5* 8.4* 8.3* 8.7* 8.8*  MG 1.4*  --  1.7 1.9 1.7 2.2  PHOS  --   --  4.4 3.8 4.0 3.8   GFR: Estimated Creatinine Clearance: 13.2 mL/min (A) (by C-G formula based on SCr of 4.2 mg/dL (H)). Recent Labs  Lab 08/26/21 2348 08/27/21 0121 08/27/21 0735 08/27/21 1109 08/28/21 0250 08/29/21 0245 08/30/21 0442 08/31/21 0311  PROCALCITON  --   --  1.62  --  1.12 1.34  --   --   WBC  --   --  9.5  --  5.0 6.8 6.3 4.5  LATICACIDVEN 3.4* 3.0*  --  1.0  --   --   --   --     Liver Function Tests: Recent Labs  Lab 08/26/21 2229 08/27/21 0735 08/28/21 0250  AST 15 17 11*  ALT 10 11 8   ALKPHOS 182* 178* 100  BILITOT 0.7 0.5 0.2*  PROT 5.3* 4.6* 4.5*  ALBUMIN <1.5* <1.5* 2.4*   Recent Labs  Lab 08/26/21 2229  LIPASE 17   No results for input(s): AMMONIA in the last 168 hours.  ABG    Component Value Date/Time   PHART 7.473 (H) 01/25/2021 0830   PCO2ART 36.1 01/25/2021 0830   PO2ART 276 (H) 01/25/2021 0830   HCO3 26.7 01/25/2021 0830   TCO2 28 01/25/2021 0830   O2SAT 100.0 01/25/2021 0830     Coagulation Profile: No results for input(s):  INR, PROTIME in the last 168 hours.  Cardiac Enzymes: No results for input(s): CKTOTAL, CKMB, CKMBINDEX, TROPONINI in the last 168 hours.  CBG: Recent Labs  Lab 08/30/21 1519 08/30/21 1952 08/31/21 0028 08/31/21 0318 08/31/21 0732  GLUCAP 71 40 94 104* 85*    Hayden Pedro, AGACNP-BC [facility]  PCCM Pgr: [phone]

## 2021-08-31 NOTE — Procedures (Signed)
Cortrak  Tube Type:  Cortrak - 43 inches Tube Location:  Right nare Initial Placement:  Stomach Secured by: Bridle Technique Used to Measure Tube Placement:  Marking at nare/corner of mouth Cortrak Secured At:  71 cm  Cortrak Tube Team Note:  Consult received to place a Cortrak feeding tube.   X-ray is required, abdominal x-ray has been ordered by the Cortrak team. Please confirm tube placement before using the Cortrak tube.   If the tube becomes dislodged please keep the tube and contact the Cortrak team at [email or web address] ([email or web address]) for replacement.  If after hours and replacement cannot be delayed, place a NG tube and confirm placement with an abdominal x-ray.    Koleen Distance MS, RD, LDN Please refer to [facility] for RD and/or RD on-call/weekend/after hours pager

## 2021-08-31 NOTE — Progress Notes (Signed)
Initial Nutrition Assessment  DOCUMENTATION CODES:   Non-severe (moderate) malnutrition in context of chronic illness  INTERVENTION:   Initiate tube feeds via Cortrak tube: - Start Vital 1.5 @ 20 ml/hr and advance by 10 ml q 8 hours to goal rate of 55 ml/hr (1320 ml/day) - ProSource TF 45 ml BID  Tube feeding regimen at goal provides 2060 kcal, 111 grams of protein, and 1008 ml of H2O.  Monitor magnesium, potassium, and phosphorus BID for at least 3 days, MD to replete as needed, as pt is at risk for refeeding syndrome given malnutrition, minimal PO intake since admission (x 4 days).  - Renal MVI daily per tube  NUTRITION DIAGNOSIS:   Moderate Malnutrition related to chronic illness (ESRD, CHF) as evidenced by mild fat depletion, moderate muscle depletion.  GOAL:   Patient will meet greater than or equal to 90% of their needs  MONITOR:   PO intake, Labs, Weight trends, TF tolerance, Skin, I & O's  REASON FOR ASSESSMENT:   Consult Enteral/tube feeding initiation and management  ASSESSMENT:   55 year old female who presented to the ED on 11/04 with N/V/D x 1 week. PMH of ESRD on PD at home, HTN, HLD, schizophrenia, seizures, CHF, hypothyroidism, discitis/osteomyelitis and epidural abscess in April 2022. Pt admitted with sepsis, aspiration pneumonia, spontaneous bacterial peritonitis.  11/05 - transferred to ICU 11/06 - NPO 11/07 - diet advanced to dysphagia 2 with thin liquids 11/09 - Cortrak placed (tip gastric)  Discussed pt with RN and during ICU rounds. Cortrak placed today. Abdominal x-ray confirms gastric placement and notes multiple loops of mildly distended gas-filled loops of bowel. Consult to start tube feeds received. Pt continues on nightly CCPD. Per Nephrology, will use all 1.5% tonight. Per Nephrology notes, pt does ambulatory PD at home 7x/week with 4 exchanges of 2 L.  Pt unable to provide any diet or weight history at this time. Per chart review, pt with  very minimal PO intake (no more than a few bites or sips at at time) since admission (x 4 days). Pt is at refeeding risk so will start tube feeds at trickle rate and slowly advance to goal. Discussed with RN.  Reviewed weight history in chart. Weight trending down from October 2018 to August 2022, now trending back up. No significant weight loss noted.  Admit weight: 62.5 kg Current weight: 65.5 kg  Medications reviewed and include: nutrisource fiber BID, nystatin mouthwash, protonix, sodium chloride 1 gram BID, IV abx, IV KCl 10 mEq x 4 runs  Labs reviewed: sodium 127, potassium 3.3 CBG's: 59-104 x 24 hours  I/O's: +3.2 L since admit  NUTRITION - FOCUSED PHYSICAL EXAM:  Flowsheet Row Most Recent Value  Orbital Region Mild depletion  Upper Arm Region Mild depletion  Thoracic and Lumbar Region Mild depletion  Buccal Region Moderate depletion  Temple Region Mild depletion  Clavicle Bone Region Moderate depletion  Clavicle and Acromion Bone Region Moderate depletion  Scapular Bone Region Mild depletion  Dorsal Hand Mild depletion  Patellar Region Mild depletion  Anterior Thigh Region Mild depletion  Posterior Calf Region Mild depletion  Edema (RD Assessment) Mild  [BLE]  Hair Reviewed  Eyes Reviewed  Mouth Reviewed  Skin Reviewed  Nails Reviewed       Diet Order:   Diet Order             DIET DYS 2 Room service appropriate? Yes; Fluid consistency: Thin  Diet effective now  EDUCATION NEEDS:   Not appropriate for education at this time  Skin:  Skin Assessment: Skin Integrity Issues: Other: MASD to groin and buttocks, wound to R heel  Last BM:  08/30/21 large type 6  Height:   Ht Readings from Last 1 Encounters:  08/28/21 5\' 4"  (1.626 m)    Weight:   Wt Readings from Last 1 Encounters:  08/31/21 65.5 kg    BMI:  Body mass index is 24.79 kg/m.  Estimated Nutritional Needs:   Kcal:  1900-2100  Protein:  90-110 grams  Fluid:   1000 ml + UOP    Gustavus Bryant, MS, RD, LDN Inpatient Clinical Dietitian Please see AMiON for contact information.

## 2021-09-01 ENCOUNTER — Inpatient Hospital Stay (HOSPITAL_COMMUNITY): Payer: Medicare Other

## 2021-09-01 DIAGNOSIS — F259 Schizoaffective disorder, unspecified: Secondary | ICD-10-CM

## 2021-09-01 DIAGNOSIS — A419 Sepsis, unspecified organism: Secondary | ICD-10-CM | POA: Diagnosis not present

## 2021-09-01 DIAGNOSIS — J690 Pneumonitis due to inhalation of food and vomit: Secondary | ICD-10-CM | POA: Diagnosis not present

## 2021-09-01 DIAGNOSIS — T8571XD Infection and inflammatory reaction due to peritoneal dialysis catheter, subsequent encounter: Secondary | ICD-10-CM | POA: Diagnosis not present

## 2021-09-01 DIAGNOSIS — K567 Ileus, unspecified: Secondary | ICD-10-CM

## 2021-09-01 DIAGNOSIS — E440 Moderate protein-calorie malnutrition: Secondary | ICD-10-CM | POA: Diagnosis present

## 2021-09-01 DIAGNOSIS — Z221 Carrier of other intestinal infectious diseases: Secondary | ICD-10-CM | POA: Diagnosis not present

## 2021-09-01 LAB — BASIC METABOLIC PANEL
Anion gap: 12 (ref 5–15)
BUN: 35 mg/dL — ABNORMAL HIGH (ref 6–20)
CO2: 19 mmol/L — ABNORMAL LOW (ref 22–32)
Calcium: 8.8 mg/dL — ABNORMAL LOW (ref 8.9–10.3)
Chloride: 97 mmol/L — ABNORMAL LOW (ref 98–111)
Creatinine, Ser: 4.13 mg/dL — ABNORMAL HIGH (ref 0.44–1.00)
GFR, Estimated: 12 mL/min — ABNORMAL LOW (ref >60)
Glucose, Bld: 61 mg/dL — ABNORMAL LOW (ref 70–99)
Potassium: 4.3 mmol/L (ref 3.5–5.1)
Sodium: 128 mmol/L — ABNORMAL LOW (ref 135–145)

## 2021-09-01 LAB — CBC
HCT: 31.6 % — ABNORMAL LOW (ref 36.0–46.0)
Hemoglobin: 10.4 g/dL — ABNORMAL LOW (ref 12.0–15.0)
MCH: 28.3 pg (ref 26.0–34.0)
MCHC: 32.9 g/dL (ref 30.0–36.0)
MCV: 85.9 fL (ref 80.0–100.0)
Platelets: 136 10*3/uL — ABNORMAL LOW (ref 150–400)
RBC: 3.68 MIL/uL — ABNORMAL LOW (ref 3.87–5.11)
RDW: 24.3 % — ABNORMAL HIGH (ref 11.5–15.5)
WBC: 5.9 10*3/uL (ref 4.0–10.5)
nRBC: 0 % (ref 0.0–0.2)

## 2021-09-01 LAB — CULTURE, BLOOD (ROUTINE X 2)
Culture: NO GROWTH
Special Requests: ADEQUATE

## 2021-09-01 LAB — PHOSPHORUS
Phosphorus: 4.3 mg/dL (ref 2.5–4.6)
Phosphorus: 4.6 mg/dL (ref 2.5–4.6)

## 2021-09-01 LAB — GLUCOSE, CAPILLARY
Glucose-Capillary: 118 mg/dL — ABNORMAL HIGH (ref 70–99)
Glucose-Capillary: 122 mg/dL — ABNORMAL HIGH (ref 70–99)
Glucose-Capillary: 73 mg/dL (ref 70–99)
Glucose-Capillary: 74 mg/dL (ref 70–99)
Glucose-Capillary: 78 mg/dL (ref 70–99)
Glucose-Capillary: 83 mg/dL (ref 70–99)

## 2021-09-01 LAB — MAGNESIUM
Magnesium: 2 mg/dL (ref 1.7–2.4)
Magnesium: 2 mg/dL (ref 1.7–2.4)

## 2021-09-01 MED ORDER — DELFLEX-LC/2.5% DEXTROSE 394 MOSM/L IP SOLN
INTRAPERITONEAL | Status: DC
  Administered 2021-09-01: 4000 mL via INTRAPERITONEAL

## 2021-09-01 MED ORDER — VITAL 1.5 CAL PO LIQD: 1000.0000 mL | ORAL | Status: DC

## 2021-09-01 MED ORDER — METOCLOPRAMIDE HCL 5 MG/ML IJ SOLN
5.0000 mg | Freq: Three times a day (TID) | INTRAMUSCULAR | Status: DC
  Administered 2021-09-01 – 2021-09-04 (×10): 5 mg via INTRAVENOUS
  Filled 2021-09-01 (×10): qty 2

## 2021-09-01 MED ORDER — VITAL HIGH PROTEIN PO LIQD: 1000.0000 mL | ORAL | Status: DC

## 2021-09-01 MED ORDER — DELFLEX-LC/1.5% DEXTROSE 344 MOSM/L IP SOLN
INTRAPERITONEAL | Status: DC
  Administered 2021-09-01: 4000 mL via INTRAPERITONEAL

## 2021-09-01 NOTE — Progress Notes (Signed)
ID Brief Note  Repeat blood cx 08/29/21 No growth in 3 days No new recommendations ID will sign off. Please call with questions   Rosiland Oz, MD Infectious Disease Physician [facility] [address] [place], [place] [address] Phone: [phone]  Fax: [phone]

## 2021-09-01 NOTE — Progress Notes (Signed)
NAME:  Jane [name], MRN:  [id number], DOB:  31-Dec-1965, LOS: 5 ADMISSION DATE:  08/26/2021, CONSULTATION DATE:  08/27/21 REFERRING MD:  Aileen Fass, CHIEF COMPLAINT:  N/V   History of Present Illness:  55 year old woman with hx of ESRD on PD, schizoaffective disorder, prior discitis/osteomyelitis/epidural abscess earlier this year presenting with N/V/D, abd pain, and cough.  Workup in ER c/w septic shock, possible aspiration PNA, shock not responding to fluids, and persistent lactic acidemia for which PCCM is consulted.    Pertinent  Medical History  ESRD on PD followed by [facility] Cognitive impairment/ mental retardation Depression/Bipolar/schizophrenia/seizures (no AEDs) HTN HLD Hypothyroidism  Significant Hospital Events: Including procedures, antibiotic start and stop dates in addition to other pertinent events   11/5 admitted sepsis  11/5 CT abd/ pelvis > Bilateral lower lobe pneumonia or aspiration. Small ascites. Peritoneal dialysis catheter with tip in the right lower quadrant. Thickened appearance of the small bowel likely related to ascites. Enteritis is less likely. No bowel obstruction. Normal appendix. Cholelithiasis. Aortic Atherosclerosis  Interim History / Subjective:  Low blood pressure, vomiting yesterday  Objective   Blood pressure (!) 72/59, pulse 94, temperature (!) 96.3 F (35.7 C), temperature source Axillary, resp. rate (!) 28, height 5\' 4"  (1.626 m), weight 62.4 kg, SpO2 97 %.        Intake/Output Summary (Last 24 hours) at 09/01/2021 0809 Last data filed at 09/01/2021 0430 Gross per 24 hour  Intake 8934.76 ml  Output 9006 ml  Net -71.24 ml   Filed Weights   08/31/21 0500 08/31/21 1906 09/01/21 0430  Weight: 65.5 kg 63 kg 62.4 kg    Examination: General: Chronically ill-appearing HEENT: Dry oral mucosa Neuro: Arouses to name CV: S1-S2 appreciated PULM: clear breath sounds, no use of accessory muscles  GI: Bowel sounds appreciated, PD  catheter in place Extremities: Lower extremity edema Skin: no rashes  Labs reviewed  Resolved Hospital Problem list   N/A  Assessment & Plan:   Septic shock Aspiration pneumonia Peritonitis -MRSE -Continue vancomycin, -Completed Unasyn  End-stage renal disease on peritoneal dialysis Hyponatremia -Nephrology continues to follow - Started on Salt Tabs (1g BID for 4 doses  Anemia Thrombocytopenia -Related to sepsis -Transfuse per protocol if needed  Hypoglycemia -Continue to trend glucose -Trickle tube feeds  Ileus -Reglan started -Trickle feeding   Multiple cognitive and psychiatric comorbidities -Home medications   History of hypertension -Home meds on hold  Hypothyroidism -Levothyroxine   Will transfer to progressive floor Transfer to hospitalist service  Best Practice (right click and "Reselect all SmartList Selections" daily)   Diet/type: Dys 2 diet, poor oral intake, plans for replace cortrack to start TF  DVT prophylaxis: prophylactic heparin  GI prophylaxis:  PPI  Lines: PD catheter Foley:  N/A Code Status:  full code, confirmed with family 11/5 Last date of multidisciplinary goals of care discussion.  11/5  Labs   CBC: Recent Labs  Lab 08/26/21 2229 08/27/21 0735 08/28/21 0250 08/28/21 0435 08/28/21 1251 08/29/21 0245 08/30/21 0442 08/31/21 0311 09/01/21 0637  WBC 10.1   < > 5.0  --   --  6.8 6.3 4.5 5.9  NEUTROABS 8.1*  --   --   --   --   --   --   --   --   HGB 10.3*   < > 6.0*   < > 9.7* 9.9* 10.7* 10.2* 10.4*  HCT 32.6*   < > 18.7*   < > 29.0* 29.6* 31.3*  31.2* 31.6*  MCV 91.8   < > 93.0  --   --  83.9 82.8 84.8 85.9  PLT 369   < > 188  --   --  191 164 160 136*   < > = values in this interval not displayed.    Basic Metabolic Panel: Recent Labs  Lab 08/26/21 2348 08/27/21 0735 08/28/21 0250 08/29/21 0245 08/30/21 0442 08/31/21 0311 08/31/21 1127 08/31/21 1813  NA  --  132* 132* 129* 127* 127*  --   --   K  --  4.8  3.7 3.1* 3.5 3.3*  --   --   CL  --  98 99 96* 94* 93*  --   --   CO2  --  20* 21* 17* 18* 18*  --   --   GLUCOSE  --  46* 43* 108* 88 98  --   --   BUN  --  53* 48* 43* 40* 36*  --   --   CREATININE  --  4.65* 4.26* 4.14* 4.22* 4.20*  --   --   CALCIUM  --  7.5* 8.4* 8.3* 8.7* 8.8*  --   --   MG  --   --  1.7 1.9 1.7 2.2 2.1 2.1  PHOS   < >  --  4.4 3.8 4.0 3.8 4.2 4.1   < > = values in this interval not displayed.   GFR: Estimated Creatinine Clearance: 13.2 mL/min (A) (by C-G formula based on SCr of 4.2 mg/dL (H)). Recent Labs  Lab 08/26/21 2348 08/27/21 0121 08/27/21 0735 08/27/21 1109 08/28/21 0250 08/29/21 0245 08/30/21 0442 08/31/21 0311 09/01/21 0637  PROCALCITON  --   --  1.62  --  1.12 1.34  --   --   --   WBC  --   --  9.5  --  5.0 6.8 6.3 4.5 5.9  LATICACIDVEN 3.4* 3.0*  --  1.0  --   --   --   --   --     Liver Function Tests: Recent Labs  Lab 08/26/21 2229 08/27/21 0735 08/28/21 0250  AST 15 17 11*  ALT 10 11 8   ALKPHOS 182* 178* 100  BILITOT 0.7 0.5 0.2*  PROT 5.3* 4.6* 4.5*  ALBUMIN <1.5* <1.5* 2.4*   Recent Labs  Lab 08/26/21 2229  LIPASE 17   No results for input(s): AMMONIA in the last 168 hours.  ABG    Component Value Date/Time   PHART 7.473 (H) 01/25/2021 0830   PCO2ART 36.1 01/25/2021 0830   PO2ART 276 (H) 01/25/2021 0830   HCO3 26.7 01/25/2021 0830   TCO2 28 01/25/2021 0830   O2SAT 100.0 01/25/2021 0830     Coagulation Profile: No results for input(s): INR, PROTIME in the last 168 hours.  Cardiac Enzymes: No results for input(s): CKTOTAL, CKMB, CKMBINDEX, TROPONINI in the last 168 hours.  CBG: Recent Labs  Lab 08/31/21 1937 08/31/21 2036 08/31/21 2334 09/01/21 0339 09/01/21 0731  GLUCAP 69* 140* 107* 78 73   The patient is critically ill with multiple organ systems failure and requires high complexity decision making for assessment and support, frequent evaluation and titration of therapies, application of advanced  monitoring technologies and extensive interpretation of multiple databases. Critical Care Time devoted to patient care services described in this note independent of APP/resident time (if applicable)  is 30 minutes.   Sherrilyn Rist MD [facility] Personal pager: See Amion If unanswered, please page CCM On-call: #  [phone]  

## 2021-09-01 NOTE — Progress Notes (Signed)
eLink Physician-Brief Progress Note Patient Name: Jane [name] DOB: 07/05/1966 MRN: [id number]   Date of Service  09/01/2021  HPI/Events of Note  KUB reviewed.  eICU Interventions  Continue to hold enteral nutrition until the AM.        Kerry Kass Christiaan Strebeck 09/01/2021, 4:49 AM

## 2021-09-01 NOTE — Progress Notes (Signed)
SLP Cancellation Note  Patient Details Name: Jane [name] MRN: [id number] DOB: [birth year]   Cancelled treatment:       Reason Eval/Treat Not Completed: Patient declined, no reason specified (Pt was approached for treatment, but refused all p.o. intake. SLP will follow up next week unless contacted sooner.)  Ariyonna Twichell I. Hardin Negus, [place], [place] Office number [phone] Pager [place] 09/01/2021, 9:44 AM

## 2021-09-01 NOTE — Progress Notes (Signed)
Called to patient room by patient's sister- patient vomiting all over the bed. Output appears to be partially digested tube feedings. Tube feedings stopped, zofran given IV, and patient cleaned up. Notified Elink of episode and TF being held.   Milford Cage, RN

## 2021-09-01 NOTE — Progress Notes (Signed)
eLink Physician-Brief Progress Note Patient Name: Jane [name] DOB: 02-28-1966 MRN: [id number]   Date of Service  09/01/2021  HPI/Events of Note  Patient vomited up a large amount of enteral feeds.  eICU Interventions  Enteral feed held, stat KUB ordered to r/o ileus.        Maverick Patman [place] 09/01/2021, 3:01 AM

## 2021-09-01 NOTE — Plan of Care (Signed)
  Problem: Clinical Measurements: Goal: Will remain free from infection Outcome: Progressing Goal: Diagnostic test results will improve Outcome: Progressing Goal: Respiratory complications will improve Outcome: Progressing Goal: Cardiovascular complication will be avoided Outcome: Progressing   Problem: Pain Managment: Goal: General experience of comfort will improve Outcome: Progressing   Problem: Skin Integrity: Goal: Risk for impaired skin integrity will decrease Outcome: Progressing   Problem: Education: Goal: Knowledge of General Education information will improve Description: Including pain rating scale, medication(s)/side effects and non-pharmacologic comfort measures Outcome: Not Progressing   Problem: Health Behavior/Discharge Planning: Goal: Ability to manage health-related needs will improve Outcome: Not Progressing   Problem: Clinical Measurements: Goal: Ability to maintain clinical measurements within normal limits will improve Outcome: Not Progressing   Problem: Activity: Goal: Risk for activity intolerance will decrease Outcome: Not Progressing   Problem: Nutrition: Goal: Adequate nutrition will be maintained Outcome: Not Progressing   Problem: Coping: Goal: Level of anxiety will decrease Outcome: Not Progressing   Problem: Elimination: Goal: Will not experience complications related to bowel motility Outcome: Not Progressing Goal: Will not experience complications related to urinary retention Outcome: Not Progressing   Problem: Safety: Goal: Ability to remain free from injury will improve Outcome: Not Progressing

## 2021-09-01 NOTE — Progress Notes (Signed)
Pt. Had vomiting episodes over night and tube feedings were held. Per Ander Slade, MD order restart tube feedings at 10 ml/hr and increase if patient tolerates.

## 2021-09-01 NOTE — Progress Notes (Signed)
Jane [name] Progress Note   Subjective:   Not offering any complaints this morning.UF 15mL with 1.5% dialysate.  Had emesis of TF overnight - KUB adynamic ileus v SBO.  TF off now.   Objective Vitals:   09/01/21 0500 09/01/21 0530 09/01/21 0600 09/01/21 0734  BP: 113/63 (!) 80/63 (!) 72/59   Pulse: 80 87 94   Resp: 16 17 (!) 28   Temp:    (!) 96.3 F (35.7 C)  TempSrc:    Axillary  SpO2: 97% 96% 97%   Weight:      Height:         Additional Objective Labs: Basic Metabolic Panel: Recent Labs  Lab 08/30/21 0442 08/31/21 0311 08/31/21 1127 08/31/21 1813 09/01/21 0637  NA 127* 127*  --   --  128*  K 3.5 3.3*  --   --  4.3  CL 94* 93*  --   --  97*  CO2 18* 18*  --   --  19*  GLUCOSE 88 98  --   --  61*  BUN 40* 36*  --   --  35*  CREATININE 4.22* 4.20*  --   --  4.13*  CALCIUM 8.7* 8.8*  --   --  8.8*  PHOS 4.0 3.8 4.2 4.1 4.3    CBC: Recent Labs  Lab 08/26/21 2229 08/27/21 0735 08/28/21 0250 08/28/21 0435 08/29/21 0245 08/30/21 0442 08/31/21 0311 09/01/21 0637  WBC 10.1   < > 5.0  --  6.8 6.3 4.5 5.9  NEUTROABS 8.1*  --   --   --   --   --   --   --   HGB 10.3*   < > 6.0*   < > 9.9* 10.7* 10.2* 10.4*  HCT 32.6*   < > 18.7*   < > 29.6* 31.3* 31.2* 31.6*  MCV 91.8   < > 93.0  --  83.9 82.8 84.8 85.9  PLT 369   < > 188  --  191 164 160 136*   < > = values in this interval not displayed.    Blood Culture    Component Value Date/Time   SDES BLOOD RIGHT HAND 08/29/2021 1233   SPECREQUEST  08/29/2021 1233    BOTTLES DRAWN AEROBIC AND ANAEROBIC Blood Culture adequate volume   CULT  08/29/2021 1233    NO GROWTH 3 DAYS Performed at [facility], [place] [address] [place], [place] [address]    REPTSTATUS PENDING 08/29/2021 1233     Physical Exam General: Alert, lying on side, nad  Heart: RRR No m,r,g Lungs: Clear bilaterally  Abdomen: soft, nontender today but mildly distended now Extremities: 1+ LE edema bilaterally  worse Dialysis Access: PD cath in place   Medications:  sodium chloride Stopped (08/31/21 1917)   ampicillin-sulbactam (UNASYN) IV Stopped (08/31/21 2307)   dialysis solution 1.5% low-MG/low-CA     dialysis solution 2.5% low-MG/low-CA     feeding supplement (VITAL 1.5 CAL) Stopped (09/01/21 0200)   vancomycin Stopped (08/30/21 1253)    benztropine  1 mg Per Tube QHS   Chlorhexidine Gluconate Cloth  6 each Topical Daily   feeding supplement (PROSource TF)  45 mL Per Tube BID   fiber  1 packet Per Tube BID   FLUoxetine  20 mg Per Tube Daily   gentamicin cream  1 application Topical Daily   heparin injection (subcutaneous)  5,000 Units Subcutaneous Q8H   levETIRAcetam  500 mg Per Tube BID  levothyroxine  50 mcg Per Tube Q breakfast   metoCLOPramide (REGLAN) injection  5 mg Intravenous Q8H   midodrine  5 mg Per Tube TID WC   multivitamin  1 tablet Per Tube QHS   nystatin  5 mL Per Tube QID   pantoprazole sodium  40 mg Per Tube Daily   risperiDONE  3 mg Per Tube QHS   sodium chloride flush  3 mL Intravenous Q12H   sodium chloride  1 g Per Tube BID WC   valproic acid  500 mg Per Tube BID    Dialysis Orders:  [facility]  [facility] [phone] (HT not open on weekends)  OP Nephrologist: Dimas Chyle  Ambulatory PD  7x/week  4 exchanges 2L    Assessment/Plan: Sepsis --Secondary to  aspiration PNA and peritonitis. +bld cx thought to be contaminant.   ID following - vanc +unasyn IV now.  PD fluid cell count after 48h therapy was improving and no e/o tunnel infection on exam.  Cont current therapy but add fungal peritonitis prophylaxis with nystatin po QID.  Plans 2 weeks vanc and 5d unasyn.  Hypotension/volume  - Hypotensive 2/2 shock. S/p volume resuscitation. Pressor support on admission. Midodrine started. BPs are at baseline now.  ESRD -  Will continue CCPD tonight - use 1.5%/2.5% tonight with worse LE edema. Anemia  - Hgb 8.8 >6.0 > 9s> 10s.     Metabolic bone disease -   Corr Ca/Phos ok.  Nutrition - Alb low.  Prot supp when taking PO Schizoaffective/Bipolar disorder - per primary  Hypokalemia - repleted and normal today.  Hyponatremia - chronic mild hyponatremia.  Stable at 127-128.  On NaCl tabs.  AM labs.   08/31/21 Spoke to RN at her dialysis clinic - has 2 family members (sister and BIL) who do her PD treatments for her and have been providing essentially total care to her recently.  Her BP chronically runs on the low side.  Her current clinical status doesn't seem far off her baseline actually, so I think it's reasonable to continue her PD.  I spoke to Jane [name]  her sister who agrees she and husband can continue the current level of care for Jane [name] and would prefer not to switch to HD.  Ongoing discussion as course progresses.   Jannifer Hick MD [facility] Pager [phone]

## 2021-09-02 DIAGNOSIS — A419 Sepsis, unspecified organism: Secondary | ICD-10-CM | POA: Diagnosis not present

## 2021-09-02 LAB — CBC
HCT: 30.1 % — ABNORMAL LOW (ref 36.0–46.0)
Hemoglobin: 9.7 g/dL — ABNORMAL LOW (ref 12.0–15.0)
MCH: 27.6 pg (ref 26.0–34.0)
MCHC: 32.2 g/dL (ref 30.0–36.0)
MCV: 85.8 fL (ref 80.0–100.0)
Platelets: 128 10*3/uL — ABNORMAL LOW (ref 150–400)
RBC: 3.51 MIL/uL — ABNORMAL LOW (ref 3.87–5.11)
RDW: 24.6 % — ABNORMAL HIGH (ref 11.5–15.5)
WBC: 5 10*3/uL (ref 4.0–10.5)
nRBC: 0 % (ref 0.0–0.2)

## 2021-09-02 LAB — GLUCOSE, CAPILLARY
Glucose-Capillary: 103 mg/dL — ABNORMAL HIGH (ref 70–99)
Glucose-Capillary: 87 mg/dL (ref 70–99)
Glucose-Capillary: 112 mg/dL — ABNORMAL HIGH (ref 70–99)
Glucose-Capillary: 79 mg/dL (ref 70–99)
Glucose-Capillary: 112 mg/dL — ABNORMAL HIGH (ref 70–99)

## 2021-09-02 LAB — RENAL FUNCTION PANEL
Albumin: <1.5 g/dL — ABNORMAL LOW (ref 3.5–5.0)
Anion gap: 14 (ref 5–15)
BUN: 34 mg/dL — ABNORMAL HIGH (ref 6–20)
CO2: 20 mmol/L — ABNORMAL LOW (ref 22–32)
Calcium: 8.7 mg/dL — ABNORMAL LOW (ref 8.9–10.3)
Chloride: 96 mmol/L — ABNORMAL LOW (ref 98–111)
Creatinine, Ser: 4.13 mg/dL — ABNORMAL HIGH (ref 0.44–1.00)
GFR, Estimated: 12 mL/min — ABNORMAL LOW (ref >60)
Glucose, Bld: 82 mg/dL (ref 70–99)
Phosphorus: 4.2 mg/dL (ref 2.5–4.6)
Potassium: 3.3 mmol/L — ABNORMAL LOW (ref 3.5–5.1)
Sodium: 130 mmol/L — ABNORMAL LOW (ref 135–145)

## 2021-09-02 LAB — VANCOMYCIN, RANDOM: Vancomycin Rm: 26

## 2021-09-02 LAB — RETICULOCYTES
Immature Retic Fract: 14 % (ref 2.3–15.9)
RBC.: 3.58 MIL/uL — ABNORMAL LOW (ref 3.87–5.11)
Retic Count, Absolute: 40.8 10*3/uL (ref 19.0–186.0)
Retic Ct Pct: 1.1 % (ref 0.4–3.1)

## 2021-09-02 LAB — FOLATE: Folate: 10.2 ng/mL (ref >5.9)

## 2021-09-02 LAB — FERRITIN: Ferritin: 1486 ng/mL — ABNORMAL HIGH (ref 11–307)

## 2021-09-02 LAB — IRON AND TIBC
Iron: 49 ug/dL (ref 28–170)
TIBC: <70 ug/dL — ABNORMAL LOW (ref 250–450)

## 2021-09-02 LAB — VITAMIN B12: Vitamin B-12: 2579 pg/mL — ABNORMAL HIGH (ref 180–914)

## 2021-09-02 LAB — OCCULT BLOOD X 1 CARD TO LAB, STOOL: Fecal Occult Bld: NEGATIVE

## 2021-09-02 MED ORDER — POTASSIUM CHLORIDE 20 MEQ PO PACK
20.0000 meq | PACK | Freq: Two times a day (BID) | ORAL | Status: AC
  Administered 2021-09-02: 20 meq
  Filled 2021-09-02: qty 1

## 2021-09-02 MED ORDER — VANCOMYCIN VARIABLE DOSE PER UNSTABLE RENAL FUNCTION (PHARMACIST DOSING): Status: DC

## 2021-09-02 MED ORDER — POTASSIUM CHLORIDE 20 MEQ PO PACK
20.0000 meq | PACK | Freq: Once | ORAL | Status: AC
  Administered 2021-09-02: 20 meq
  Filled 2021-09-02: qty 1

## 2021-09-02 MED ORDER — VANCOMYCIN HCL 1000 MG/200ML IV SOLN
1000.0000 mg | Freq: Once | INTRAVENOUS | Status: AC
  Administered 2021-09-03: 1000 mg via INTRAVENOUS
  Filled 2021-09-02: qty 200

## 2021-09-02 MED ORDER — VITAL 1.5 CAL PO LIQD
1000.0000 mL | ORAL | Status: DC
Start: 2021-09-02 — End: 2021-09-04
  Administered 2021-09-02 – 2021-09-04 (×3): 1000 mL
  Filled 2021-09-02: qty 1000

## 2021-09-02 MED ORDER — POTASSIUM CHLORIDE 20 MEQ PO PACK
20.0000 meq | PACK | Freq: Two times a day (BID) | ORAL | Status: DC
  Filled 2021-09-02: qty 1

## 2021-09-02 NOTE — Evaluation (Signed)
Physical Therapy Evaluation Patient Details Name: Jane [name] MRN: [id number] DOB: 1966/06/17 Today's Date: 09/02/2021  History of Present Illness  Pt is a 55 y/o female admitted 08/27/21 with n/v/d, adbdominal pain and cough. Found with sepsis, possible aspiration PNA, and cdiff. PMH includes: ESRD on PD, schizoaffective disorder,cognitive impairment, prior discitis/osteomyelitis/epiducal abscess.  Clinical Impression  Pt presents to PT with limitations in mobility and balance. At baseline pt is cared for by sister and brother in law. Sounds like max assist for transfers. Will work to maximize mobility and decr burden of care for family.        Recommendations for follow up therapy are one component of a multi-disciplinary discharge planning process, led by the attending physician.  Recommendations may be updated based on patient status, additional functional criteria and insurance authorization.  Follow Up Recommendations Home health PT    Assistance Recommended at Discharge Frequent or constant Supervision/Assistance  Functional Status Assessment Patient has had a recent decline in their functional status and/or demonstrates limited ability to make significant improvements in function in a reasonable and predictable amount of time  Equipment Recommendations  None recommended by PT    Recommendations for Other Services       Precautions / Restrictions Precautions Precautions: Fall;Other (comment) Precaution Comments: coretrac Restrictions Weight Bearing Restrictions: No      Mobility  Bed Mobility Overal bed mobility: Needs Assistance Bed Mobility: Supine to Sit;Sit to Supine     Supine to sit: +2 for physical assistance;Total assist Sit to supine: +2 for physical assistance;Total assist   General bed mobility comments: Assist with all aspects. Pt reluctant to assist    Transfers Overall transfer level: Needs assistance Equipment used: 2 person hand held  assist Transfers: Sit to/from Stand Sit to Stand: +2 physical assistance;Max assist           General transfer comment: Performed partial sit to stand with +2 max to bring hips up. Buttocks cleared bed ~12 inches    Ambulation/Gait               General Gait Details: Unable. Nonambulatory since April  Stairs            Wheelchair Mobility    Modified Rankin (Stroke Patients Only)       Balance Overall balance assessment: Needs assistance Sitting-balance support: Bilateral upper extremity supported;Feet unsupported Sitting balance-Leahy Scale: Poor Sitting balance - Comments: Pt sat EOB x 15 minutes with min guard to min assist Postural control: Right lateral lean;Posterior lean     Standing balance comment: Unable to fully stand with +2 max assist                             Pertinent Vitals/Pain Pain Assessment: Faces Faces Pain Scale: Hurts even more Pain Location: Unable to indicate specifically. Per sister likely back. Pain Descriptors / Indicators: Grimacing;Crying Pain Intervention(s): Limited activity within patient's tolerance;Monitored during session;Repositioned    Home Living Family/patient expects to be discharged to:: Private residence Living Arrangements: Other (Comment) (sister and her husband) Available Help at Discharge: Family;Available 24 hours/day Type of Home: House Home Access: Stairs to enter Entrance Stairs-Rails: None Entrance Stairs-Number of Steps: 4 Alternate Level Stairs-Number of Steps: flight Home Layout: Two level;Able to live on main level with bedroom/bathroom Home Equipment: BSC/3in1;Wheelchair - Publishing copy (2 wheels);Hospital bed      Prior Function Prior Level of Function : Needs assist  Physical Assist : Mobility (physical);ADLs (physical) Mobility (physical): Bed mobility;Transfers ADLs (physical): Grooming;Bathing;Dressing;Toileting;IADLs Mobility Comments: sister reports pt  able to help a little with bed mobility and standing, pivots to w/c ADLs Comments: pt can wash face, feed self, complete UB care but otherwise needs assist from sister     Hand Dominance   Dominant Hand: Right    Extremity/Trunk Assessment        Lower Extremity Assessment Lower Extremity Assessment: RLE deficits/detail;LLE deficits/detail RLE Deficits / Details: strength grossly 2-/5 LLE Deficits / Details: strength grossly 2-/5, Edematous       Communication   Communication: Expressive difficulties  Cognition Arousal/Alertness: Lethargic Behavior During Therapy: Flat affect Overall Cognitive Status: History of cognitive impairments - at baseline                                          General Comments General comments (skin integrity, edema, etc.): VSS on RA    Exercises     Assessment/Plan    PT Assessment Patient needs continued PT services  PT Problem List Decreased strength;Decreased activity tolerance;Decreased balance;Decreased mobility       PT Treatment Interventions DME instruction;Functional mobility training;Therapeutic activities;Therapeutic exercise;Balance training;Patient/family education;Wheelchair mobility training    PT Goals (Current goals can be found in the Care Plan section)  Acute Rehab PT Goals Patient Stated Goal: Pt unable to state PT Goal Formulation: With family Time For Goal Achievement: 09/16/21 Potential to Achieve Goals: Fair    Frequency Min 3X/week   Barriers to discharge        Co-evaluation PT/OT/SLP Co-Evaluation/Treatment: Yes Reason for Co-Treatment: Necessary to address cognition/behavior during functional activity;For patient/therapist safety PT goals addressed during session: Mobility/safety with mobility;Balance         AM-PAC PT "6 Clicks" Mobility  Outcome Measure Help needed turning from your back to your side while in a flat bed without using bedrails?: Total Help needed moving from  lying on your back to sitting on the side of a flat bed without using bedrails?: Total Help needed moving to and from a bed to a chair (including a wheelchair)?: Total Help needed standing up from a chair using your arms (e.g., wheelchair or bedside chair)?: Total Help needed to walk in hospital room?: Total Help needed climbing 3-5 steps with a railing? : Total 6 Click Score: 6    End of Session Equipment Utilized During Treatment: Gait belt Activity Tolerance: Patient limited by fatigue Patient left: in bed;with bed alarm set;with family/visitor present (in chair position) Nurse Communication: Mobility status PT Visit Diagnosis: Other abnormalities of gait and mobility (R26.89)    Time: 2778-2423 PT Time Calculation (min) (ACUTE ONLY): 41 min   Charges:   PT Evaluation $PT Eval Moderate Complexity: 1 Mod          Love Chowning [place] Pager [phone] Office [place] 09/02/2021, 9:57 AM

## 2021-09-02 NOTE — Progress Notes (Signed)
PROGRESS NOTE    Jane [name]  OZD:[id number] DOB: 07-23-1966 DOA: 08/26/2021 PCP: Jane Mccreedy, MD   Chief Complaint  Patient presents with   Emesis   Brief Narrative/Hospital Course:  Jane [name], 55 y.o. female with PMH of ESRD on PD, size affective disorder, history of discitis/osteomyelitis/epidural abscess earlier this year presented to the ED with nausea, vomiting, diarrhea, abdominal pain, cough. In the ED work-up showed septic shock, acute encephalopathy, possible aspiration pneumonia, and patient was admitted to ICU due to persistent lactic acidosis managed with pressor support. Seen by nephrology ID.  CT abdomen pelvis bilateral lower lobe pneumonia or aspiration, small ascites. Patient was treated for SBP secondary to MRSE- from, PD catheter and aspiration pneumonia.  She has been on NG tube feeding refusing to take orally and declining speech eval/ not tolerating PO. NG tube in place rectal tube in place and PD cathether in place in ICU Transferred to [facility] 11/11.  Subjective: Seen this morning patient sleepy did wake up able to speak in muffled voice could tell her name and that she is in the hospital unable to tell me current date.  NG tube in place and coughing intermittently. her sister is on the bedside recliner Per sister patient at baseline is WC bound since back surgery in march this year,lives with her sister and has been dealing with her disabling psych issues for a long time. NGT in place. Drank some juice BP is in 89/53  Assessment & Plan:  Septic shock due to infection as below below SBP 2/2 MRSA-PD catheter associated: Aspiration pneumonia on Unasyn: Repeat PF analysis 11/8 significant improvement in WBC count no evidence of exit site/tunnel infection.  Appreciate ID input, repeat blood culture 11/722 no growth. Plan is to complete 2 weeks of vancomycin and 5 days of Unasyn-given C. difficile carrier state.  Started on fungal peritonitis  prophylaxis with nystatin p.o. 4 times daily.  ESRD on PD: Nephrology on board continue dialysis Hypokalemia replete per nephrology Hyponatremia started on salt tablets. Sod 127>130  FEN Ileus NGT+ hypoglycemia:  Reglan started 11/10, tolerating tube feed increase to goal slowly RD on board. On DYS 2 diet- SLP to f/u.  Serum cortisol was adequate at 19.5 on 11/5.  Sugar stable   Diarrhea with C. difficile antigen positive toxin negative, indeterminate results.  Rectal tube in place.has had soft stool.  Seen by ID-felt to be C. difficile carrier, minimize antibiotics, continue symptomatic management  MRSE and GPR positive in 1/2 sets felt to be contaminants with 2 different organism from single set.  Blood pressure soft/hypotension Sister reports lately she has been having low blood pressure.  Continue midodrine  Schizoaffective disorder/bipolar disorder poa Multiple cognitive and psychiatric comorbidities: Continue patient home meds-Cogentin, fluoxetine, Risperdal, Keppra. Cont supportive care  Hypothyroidism cont Synthroid  History of discitis/epidural abscess s/p laminectomy in 01/25/2021 with MSSA completed 8 weeks of antibiotic.  Sister reports patient is wheelchair-bound since her surgery.  Anemia-likely multifactorial from chronic renal disease.  10.3 g on admission 11/4 dropped to as low as 6.0 gm on 11/5- s/p 2 units prbcs. Was FOBT positive 11/5 although no evidence of active GI bleeding.  Hemoglobin holding stable since then.  On PPI. Repeat hemoccult. May need GI eval- await fobt first,monitor hb. Recent Labs  Lab 08/29/21 0245 08/30/21 0442 08/31/21 0311 09/01/21 0637 09/02/21 0536  HGB 9.9* 10.7* 10.2* 10.4* 9.7*  HCT 29.6* 31.3* 31.2* 31.6* 30.1*     Malnutrition of moderate degree: RD following,  continue to feed speech eval, on dysphagia diet Nutrition Problem: Moderate Malnutrition Etiology: chronic illness (ESRD, CHF) Signs/Symptoms: mild fat depletion,  moderate muscle depletion Interventions: Tube feeding, Prostat, MVI   Pressure injury of right sacrum stage II POA supportive care wound care as below Pressure Injury 01/25/21 Sacrum Right;Left Stage 2 -  Partial thickness loss of dermis presenting as a shallow open injury with a red, pink wound bed without slough. (Active)  01/25/21 1951  Location: Sacrum  Location Orientation: Right;Left  Staging: Stage 2 -  Partial thickness loss of dermis presenting as a shallow open injury with a red, pink wound bed without slough. (old pressure injury that is healing)  Wound Description (Comments):   Present on Admission: Yes   DVT prophylaxis: heparin injection 5,000 Units Start: 08/30/21 1400 Code Status:   Code Status: Full Code Family Communication: plan of care discussed with patient's sister at bedside. Status is: Inpatient Remains inpatient appropriate because: For ongoing management of sepsis, poor poor oral intake ileus. Currently medically not stable.   This patient is planning for return home.  Sister reports they are not planning for facility placement    Objective: Vitals last 24 hrs: Vitals:   09/02/21 0500 09/02/21 0530 09/02/21 0600 09/02/21 0729  BP: 106/65 104/75 (!) 97/58   Pulse: 96 91 99   Resp: (!) 24 (!) 24 (!) 28   Temp:    (!) 97.5 F (36.4 C)  TempSrc:    Axillary  SpO2: 94% 98% 94%   Weight:      Height:       Weight change: 0.8 kg  Intake/Output Summary (Last 24 hours) at 09/02/2021 0847 Last data filed at 09/02/2021 0600 Gross per 24 hour  Intake 8727 ml  Output 8948 ml  Net -221 ml   Net IO Since Admission: 3,005.28 mL [09/02/21 0847]   Physical Examination: General exam: Sleepy weak oriented x2, older than stated age, chronically sick looking.   HEENT:Oral mucosa moist, Ear/Nose WNL grossly,dentition normal. Respiratory system: B/l diminished BS, no use of accessory muscle, non tender. Cardiovascular system: S1 & S2 +,No JVD. Gastrointestinal  system: Abdomen soft, mildly tender PD catheter in place  Nervous System:Alert, awake, moving extremities. Extremities: edema present ankle trace, distal peripheral pulses palpable.  Skin: No rashes, no icterus. MSK: Normal muscle bulk, tone, power.  Medications reviewed:  Scheduled Meds:  benztropine  1 mg Per Tube QHS   Chlorhexidine Gluconate Cloth  6 each Topical Daily   feeding supplement (PROSource TF)  45 mL Per Tube BID   FLUoxetine  20 mg Per Tube Daily   gentamicin cream  1 application Topical Daily   heparin injection (subcutaneous)  5,000 Units Subcutaneous Q8H   levETIRAcetam  500 mg Per Tube BID   levothyroxine  50 mcg Per Tube Q breakfast   metoCLOPramide (REGLAN) injection  5 mg Intravenous Q8H   midodrine  5 mg Per Tube TID WC   multivitamin  1 tablet Per Tube QHS   nystatin  5 mL Per Tube QID   pantoprazole sodium  40 mg Per Tube Daily   potassium chloride  20 mEq Per Tube Once   risperiDONE  3 mg Per Tube QHS   sodium chloride flush  3 mL Intravenous Q12H   valproic acid  500 mg Per Tube BID   Continuous Infusions:  sodium chloride Stopped (08/31/21 1917)   ampicillin-sulbactam (UNASYN) IV Stopped (09/01/21 2257)   dialysis solution 1.5% low-MG/low-CA     dialysis  solution 2.5% low-MG/low-CA     feeding supplement (VITAL 1.5 CAL) 20 mL/hr at 09/01/21 1236   vancomycin Stopped (08/30/21 1253)   Diet Order             DIET DYS 2 Room service appropriate? Yes; Fluid consistency: Thin  Diet effective now                   Nutrition Problem: Moderate Malnutrition Etiology: chronic illness (ESRD, CHF) Signs/Symptoms: mild fat depletion, moderate muscle depletion Interventions: Tube feeding, Prostat, MVI  Weight change: 0.8 kg  Wt Readings from Last 3 Encounters:  09/02/21 63 kg  06/20/21 58.9 kg  02/04/21 58.9 kg     Consultants:see note  Procedures:see note Antimicrobials: Anti-infectives (From admission, onward)    Start     Dose/Rate Route  Frequency Ordered Stop   08/30/21 1100  vancomycin (VANCOREADY) IVPB 1000 mg/200 mL        1,000 mg 200 mL/hr over 60 Minutes Intravenous every 72 hours 08/30/21 0928     08/29/21 2200  Ampicillin-Sulbactam (UNASYN) 3 g in sodium chloride 0.9 % 100 mL IVPB        3 g 200 mL/hr over 30 Minutes Intravenous Every 24 hours 08/29/21 1239 09/02/21 2359   08/28/21 2200  ceFEPIme (MAXIPIME) 2 g in sodium chloride 0.9 % 100 mL IVPB  Status:  Discontinued        2 g 200 mL/hr over 30 Minutes Intravenous Every 48 hours 08/27/21 0348 08/29/21 1239   08/28/21 0051  vancomycin variable dose per unstable renal function (pharmacist dosing)  Status:  Discontinued         Does not apply See admin instructions 08/28/21 0052 08/30/21 0936   08/27/21 0330  metroNIDAZOLE (FLAGYL) IVPB 500 mg  Status:  Discontinued        500 mg 100 mL/hr over 60 Minutes Intravenous Every 12 hours 08/27/21 0328 08/29/21 1239   08/27/21 0315  vancomycin (VANCOREADY) IVPB 1500 mg/300 mL        1,500 mg 150 mL/hr over 120 Minutes Intravenous  Once 08/27/21 0303 08/27/21 0654   08/27/21 0215  ceFEPIme (MAXIPIME) 2 g in sodium chloride 0.9 % 100 mL IVPB        2 g 200 mL/hr over 30 Minutes Intravenous  Once 08/27/21 0202 08/27/21 0327      Culture/Microbiology    Component Value Date/Time   SDES BLOOD RIGHT HAND 08/29/2021 1233   SPECREQUEST  08/29/2021 1233    BOTTLES DRAWN AEROBIC AND ANAEROBIC Blood Culture adequate volume   CULT  08/29/2021 1233    NO GROWTH 4 DAYS Performed at [facility], [place] [address], [place], [place] [address]    REPTSTATUS PENDING 08/29/2021 1233    Other culture-see note  Unresulted Labs (From admission, onward)     Start     Ordered   09/02/21 0800  Vancomycin, random  ONCE - STAT,   STAT       Question:  Specimen collection method  Answer:  Lab=Lab collect   09/02/21 0759   09/02/21 0536  CBC  Daily,   R     Question:  Specimen collection method  Answer:  Lab=Lab collect    09/02/21 0535   09/02/21 0500  Vancomycin, random  Once-Timed,   TIMED       Question:  Specimen collection method  Answer:  Lab=Lab collect   09/01/21 1056   08/27/21 0844  Expectorated Sputum Assessment w Gram  Stain, Rflx to Resp Cult  Once,   R        08/27/21 0843   08/27/21 0349  Norovirus group 1 & 2 by PCR, stool  (Norovirus group 1 & 2 by PCR, stool panel)  Once,   R        08/27/21 0348   08/27/21 0343  Urine Culture  Add-on,   AD       Question:  Indication  Answer:  Sepsis   08/27/21 0344           Data Reviewed: I have personally reviewed following labs and imaging studies CBC: Recent Labs  Lab 08/26/21 2229 08/27/21 0735 08/29/21 0245 08/30/21 0442 08/31/21 0311 09/01/21 0637 09/02/21 0536  WBC 10.1   < > 6.8 6.3 4.5 5.9 5.0  NEUTROABS 8.1*  --   --   --   --   --   --   HGB 10.3*   < > 9.9* 10.7* 10.2* 10.4* 9.7*  HCT 32.6*   < > 29.6* 31.3* 31.2* 31.6* 30.1*  MCV 91.8   < > 83.9 82.8 84.8 85.9 85.8  PLT 369   < > 191 164 160 136* 128*   < > = values in this interval not displayed.   Basic Metabolic Panel: Recent Labs  Lab 08/29/21 0245 08/30/21 0442 08/31/21 0311 08/31/21 1127 08/31/21 1813 09/01/21 0637 09/01/21 1546 09/02/21 0534  NA 129* 127* 127*  --   --  128*  --  130*  K 3.1* 3.5 3.3*  --   --  4.3  --  3.3*  CL 96* 94* 93*  --   --  97*  --  96*  CO2 17* 18* 18*  --   --  19*  --  20*  GLUCOSE 108* 88 98  --   --  61*  --  82  BUN 43* 40* 36*  --   --  35*  --  34*  CREATININE 4.14* 4.22* 4.20*  --   --  4.13*  --  4.13*  CALCIUM 8.3* 8.7* 8.8*  --   --  8.8*  --  8.7*  MG 1.9 1.7 2.2 2.1 2.1 2.0 2.0  --   PHOS 3.8 4.0 3.8 4.2 4.1 4.3 4.6 4.2   GFR: Estimated Creatinine Clearance: 13.4 mL/min (A) (by C-G formula based on SCr of 4.13 mg/dL (H)). Liver Function Tests: Recent Labs  Lab 08/26/21 2229 08/27/21 0735 08/28/21 0250 09/02/21 0534  AST 15 17 11*  --   ALT 10 11 8   --   ALKPHOS 182* 178* 100  --   BILITOT 0.7 0.5 0.2*   --   PROT 5.3* 4.6* 4.5*  --   ALBUMIN <1.5* <1.5* 2.4* <1.5*   Recent Labs  Lab 08/26/21 2229  LIPASE 17   No results for input(s): AMMONIA in the last 168 hours. Coagulation Profile: No results for input(s): INR, PROTIME in the last 168 hours. Cardiac Enzymes: No results for input(s): CKTOTAL, CKMB, CKMBINDEX, TROPONINI in the last 168 hours. BNP (last 3 results) No results for input(s): PROBNP in the last 8760 hours. HbA1C: No results for input(s): HGBA1C in the last 72 hours. CBG: Recent Labs  Lab 09/01/21 1515 09/01/21 2022 09/01/21 2341 09/02/21 0344 09/02/21 0725  GLUCAP 74 122* 118* 103* 87   Lipid Profile: No results for input(s): CHOL, HDL, LDLCALC, TRIG, CHOLHDL, LDLDIRECT in the last 72 hours. Thyroid Function Tests: No results for input(s): TSH, T4TOTAL, FREET4,  T3FREE, THYROIDAB in the last 72 hours. Anemia Panel: No results for input(s): VITAMINB12, FOLATE, FERRITIN, TIBC, IRON, RETICCTPCT in the last 72 hours. Sepsis Labs: Recent Labs  Lab 08/26/21 2348 08/27/21 0121 08/27/21 0735 08/27/21 1109 08/28/21 0250 08/29/21 0245  PROCALCITON  --   --  1.62  --  1.12 1.34  LATICACIDVEN 3.4* 3.0*  --  1.0  --   --     Recent Results (from the past 240 hour(s))  Resp Panel by RT-PCR (Flu A&B, Covid) Nasopharyngeal Swab     Status: None   Collection Time: 08/26/21 10:54 PM   Specimen: Nasopharyngeal Swab; Nasopharyngeal(NP) swabs in vial transport medium  Result Value Ref Range Status   SARS Coronavirus 2 by RT PCR NEGATIVE NEGATIVE Final    Comment: (NOTE) SARS-CoV-2 target nucleic acids are NOT DETECTED.  The SARS-CoV-2 RNA is generally detectable in upper respiratory specimens during the acute phase of infection. The lowest concentration of SARS-CoV-2 viral copies this assay can detect is 138 copies/mL. A negative result does not preclude SARS-Cov-2 infection and should not be used as the sole basis for treatment or other patient management  decisions. A negative result may occur with  improper specimen collection/handling, submission of specimen other than nasopharyngeal swab, presence of viral mutation(s) within the areas targeted by this assay, and inadequate number of viral copies(<138 copies/mL). A negative result must be combined with clinical observations, patient history, and epidemiological information. The expected result is Negative.  Fact Sheet for Patients:  EntrepreneurPulse.com.au  Fact Sheet for Healthcare Providers:  IncredibleEmployment.be  This test is no t yet approved or cleared by the [place] FDA and  has been authorized for detection and/or diagnosis of SARS-CoV-2 by FDA under an Emergency Use Authorization (EUA). This EUA will remain  in effect (meaning this test can be used) for the duration of the COVID-19 declaration under Section 564(b)(1) of the Act, 21 U.S.C.section 360bbb-3(b)(1), unless the authorization is terminated  or revoked sooner.       Influenza A by PCR NEGATIVE NEGATIVE Final   Influenza B by PCR NEGATIVE NEGATIVE Final    Comment: (NOTE) The Xpert Xpress SARS-CoV-2/FLU/RSV plus assay is intended as an aid in the diagnosis of influenza from Nasopharyngeal swab specimens and should not be used as a sole basis for treatment. Nasal washings and aspirates are unacceptable for Xpert Xpress SARS-CoV-2/FLU/RSV testing.  Fact Sheet for Patients: EntrepreneurPulse.com.au  Fact Sheet for Healthcare Providers: IncredibleEmployment.be  This test is not yet approved or cleared by the [place] FDA and has been authorized for detection and/or diagnosis of SARS-CoV-2 by FDA under an Emergency Use Authorization (EUA). This EUA will remain in effect (meaning this test can be used) for the duration of the COVID-19 declaration under Section 564(b)(1) of the Act, 21 U.S.C. section 360bbb-3(b)(1), unless the  authorization is terminated or revoked.  Performed at [facility], [place] [address], [place], [place] [address]   Blood culture (routine x 2)     Status: Abnormal   Collection Time: 08/27/21  2:00 AM   Specimen: BLOOD  Result Value Ref Range Status   Specimen Description BLOOD RIGHT ARM  Final   Special Requests   Final    BOTTLES DRAWN AEROBIC AND ANAEROBIC Blood Culture results may not be optimal due to an excessive volume of blood received in culture bottles   Culture  Setup Time   Final    GRAM POSITIVE COCCI AEROBIC BOTTLE ONLY CRITICAL RESULT CALLED TO,  READ BACK BY AND VERIFIED WITH: PHARMD GREG ABBOTT 08/28/21@00 :50 BY TW IN BOTH AEROBIC AND ANAEROBIC BOTTLES    Culture (A)  Final    STAPHYLOCOCCUS EPIDERMIDIS DIPHTHEROIDS(CORYNEBACTERIUM SPECIES) Standardized susceptibility testing for this organism is not available. Performed at [facility], [place] [address], [place], [place] [address]    Report Status 08/31/2021 FINAL  Final   Organism ID, Bacteria STAPHYLOCOCCUS EPIDERMIDIS  Final      Susceptibility   Staphylococcus epidermidis - MIC*    CIPROFLOXACIN 1 SENSITIVE Sensitive     ERYTHROMYCIN <=0.25 SENSITIVE Sensitive     GENTAMICIN 8 INTERMEDIATE Intermediate     OXACILLIN >=4 RESISTANT Resistant     TETRACYCLINE 2 SENSITIVE Sensitive     VANCOMYCIN 2 SENSITIVE Sensitive     TRIMETH/SULFA 160 RESISTANT Resistant     CLINDAMYCIN <=0.25 SENSITIVE Sensitive     RIFAMPIN <=0.5 SENSITIVE Sensitive     Inducible Clindamycin NEGATIVE Sensitive     * STAPHYLOCOCCUS EPIDERMIDIS  Blood Culture ID Panel (Reflexed)     Status: Abnormal   Collection Time: 08/27/21  2:00 AM  Result Value Ref Range Status   Enterococcus faecalis NOT DETECTED NOT DETECTED Final   Enterococcus Faecium NOT DETECTED NOT DETECTED Final   Listeria monocytogenes NOT DETECTED NOT DETECTED Final   Staphylococcus species DETECTED (A) NOT DETECTED Final    Comment: CRITICAL RESULT CALLED  TO, READ BACK BY AND VERIFIED WITH: PHARMD GREG ABBOTT 08/28/21@00 :50 BY TW    Staphylococcus aureus (BCID) NOT DETECTED NOT DETECTED Final   Staphylococcus epidermidis DETECTED (A) NOT DETECTED Final    Comment: Methicillin (oxacillin) resistant coagulase negative staphylococcus. Possible blood culture contaminant (unless isolated from more than one blood culture draw or clinical case suggests pathogenicity). No antibiotic treatment is indicated for blood  culture contaminants. CRITICAL RESULT CALLED TO, READ BACK BY AND VERIFIED WITH: PHARMD GREG ABBOTT 08/28/21@00 :50 BY TW    Staphylococcus lugdunensis NOT DETECTED NOT DETECTED Final   Streptococcus species NOT DETECTED NOT DETECTED Final   Streptococcus agalactiae NOT DETECTED NOT DETECTED Final   Streptococcus pneumoniae NOT DETECTED NOT DETECTED Final   Streptococcus pyogenes NOT DETECTED NOT DETECTED Final   A.calcoaceticus-baumannii NOT DETECTED NOT DETECTED Final   Bacteroides fragilis NOT DETECTED NOT DETECTED Final   Enterobacterales NOT DETECTED NOT DETECTED Final   Enterobacter cloacae complex NOT DETECTED NOT DETECTED Final   Escherichia coli NOT DETECTED NOT DETECTED Final   Klebsiella aerogenes NOT DETECTED NOT DETECTED Final   Klebsiella oxytoca NOT DETECTED NOT DETECTED Final   Klebsiella pneumoniae NOT DETECTED NOT DETECTED Final   Proteus species NOT DETECTED NOT DETECTED Final   Salmonella species NOT DETECTED NOT DETECTED Final   Serratia marcescens NOT DETECTED NOT DETECTED Final   Haemophilus influenzae NOT DETECTED NOT DETECTED Final   Neisseria meningitidis NOT DETECTED NOT DETECTED Final   Pseudomonas aeruginosa NOT DETECTED NOT DETECTED Final   Stenotrophomonas maltophilia NOT DETECTED NOT DETECTED Final   Candida albicans NOT DETECTED NOT DETECTED Final   Candida auris NOT DETECTED NOT DETECTED Final   Candida glabrata NOT DETECTED NOT DETECTED Final   Candida krusei NOT DETECTED NOT DETECTED Final    Candida parapsilosis NOT DETECTED NOT DETECTED Final   Candida tropicalis NOT DETECTED NOT DETECTED Final   Cryptococcus neoformans/gattii NOT DETECTED NOT DETECTED Final   Methicillin resistance mecA/C DETECTED (A) NOT DETECTED Final    Comment: CRITICAL RESULT CALLED TO, READ BACK BY AND VERIFIED WITH: PHARMD GREG ABBOTT 08/28/21@00 :50 BY  TW Performed at [facility], [place] [address] [place], [place] [address]   MRSA Next Gen by PCR, Nasal     Status: None   Collection Time: 08/27/21  3:29 AM   Specimen: Nasal Mucosa; Nasal Swab  Result Value Ref Range Status   MRSA by PCR Next Gen NOT DETECTED NOT DETECTED Final    Comment: (NOTE) The GeneXpert MRSA Assay (FDA approved for NASAL specimens only), is one component of a comprehensive MRSA colonization surveillance program. It is not intended to diagnose MRSA infection nor to guide or monitor treatment for MRSA infections. Test performance is not FDA approved in patients less than 61 years old. Performed at [facility], [place] [address] [place], [place] [address]   Blood culture (routine x 2)     Status: None   Collection Time: 08/27/21  7:57 AM   Specimen: BLOOD RIGHT FOREARM  Result Value Ref Range Status   Specimen Description BLOOD RIGHT FOREARM  Final   Special Requests   Final    BOTTLES DRAWN AEROBIC AND ANAEROBIC Blood Culture adequate volume   Culture   Final    NO GROWTH 5 DAYS Performed at [facility], [place] [address], [place], [place] [address]    Report Status 09/01/2021 FINAL  Final  Body fluid culture w Gram Stain     Status: None   Collection Time: 08/27/21  2:16 PM   Specimen: Peritoneal Washings; Body Fluid  Result Value Ref Range Status   Specimen Description PERITONEAL FLUID  Final   Special Requests NONE  Final   Gram Stain   Final    WBC PRESENT,BOTH PMN AND MONONUCLEAR GRAM POSITIVE COCCI CYTOSPIN SMEAR Performed at [facility], [address], [place], [place] [address]     Culture ABUNDANT STAPHYLOCOCCUS EPIDERMIDIS  Final   Report Status 08/30/2021 FINAL  Final   Organism ID, Bacteria STAPHYLOCOCCUS EPIDERMIDIS  Final      Susceptibility   Staphylococcus epidermidis - MIC*    CIPROFLOXACIN 2 INTERMEDIATE Intermediate     ERYTHROMYCIN <=0.25 SENSITIVE Sensitive     GENTAMICIN 4 SENSITIVE Sensitive     OXACILLIN >=4 RESISTANT Resistant     TETRACYCLINE <=1 SENSITIVE Sensitive     VANCOMYCIN 1 SENSITIVE Sensitive     TRIMETH/SULFA 20 SENSITIVE Sensitive     CLINDAMYCIN <=0.25 SENSITIVE Sensitive     RIFAMPIN <=0.5 SENSITIVE Sensitive     Inducible Clindamycin NEGATIVE Sensitive     * ABUNDANT STAPHYLOCOCCUS EPIDERMIDIS  C Difficile Quick Screen w PCR reflex     Status: Abnormal   Collection Time: 08/28/21 10:25 AM   Specimen: STOOL  Result Value Ref Range Status   C Diff antigen POSITIVE (A) NEGATIVE Final    Comment: RESULTS REPORTED TO RN BEABRAUT 08/28/21 AT 1120 BY NM   C Diff toxin NEGATIVE NEGATIVE Final   C Diff interpretation Results are indeterminate. See PCR results.  Final    Comment: Performed at [facility], [place] [address], [place], [place] [address]  C. Diff by PCR, Reflexed     Status: Abnormal   Collection Time: 08/28/21 10:25 AM  Result Value Ref Range Status   Toxigenic C. Difficile by PCR POSITIVE (A) NEGATIVE Final    Comment: Positive for toxigenic C. difficile with little to no toxin production. Only treat if clinical presentation suggests symptomatic illness. Performed at [facility], [place] [address], [place], [place] [address]   Culture,  blood (routine x 2)     Status: None (Preliminary result)   Collection Time: 08/29/21 12:24 PM   Specimen: BLOOD LEFT HAND  Result Value Ref Range Status   Specimen Description BLOOD LEFT HAND  Final   Special Requests   Final    BOTTLES DRAWN AEROBIC AND ANAEROBIC Blood Culture adequate volume   Culture   Final    NO GROWTH 4 DAYS Performed at [facility], [address], [place], [place] [address]    Report Status PENDING  Incomplete  Culture, blood (routine x 2)     Status: None (Preliminary result)   Collection Time: 08/29/21 12:33 PM   Specimen: BLOOD RIGHT HAND  Result Value Ref Range Status   Specimen Description BLOOD RIGHT HAND  Final   Special Requests   Final    BOTTLES DRAWN AEROBIC AND ANAEROBIC Blood Culture adequate volume   Culture   Final    NO GROWTH 4 DAYS Performed at [facility], [place] [address], [place], [place] [address]    Report Status PENDING  Incomplete     Radiology Studies: DG Abd 1 View  Result Date: 09/01/2021 CLINICAL DATA:  Ileus EXAM: ABDOMEN - 1 VIEW COMPARISON:  07/31/2021 FINDINGS: Enteric tube terminates in the distal gastric antrum/proximal duodenum. Multiple dilated loops of small bowel in the central abdomen, possibly reflecting small bowel ileus, partial small bowel obstruction not excluded. This is unchanged. IMPRESSION: Enteric tube terminates in the distal gastric antrum/proximal duodenum. Stable dilated loops of small bowel, possibly reflecting adynamic small bowel ileus, partial small bowel obstruction not excluded. Electronically Signed   By: Julian Hy M.D.   On: 09/01/2021 03:36   DG Abd Portable 1V  Result Date: 08/31/2021 CLINICAL DATA:  Feeding tube placement EXAM: PORTABLE ABDOMEN - 1 VIEW COMPARISON:  None. FINDINGS: Weighted enteric tube tip is in stomach just to the right of midline. Multiple loops of mildly distended gas-filled loops of bowel visualized in the mid abdomen. Irregular opacities in the bilateral left visualized lower lungs. IMPRESSION: Enteric tube tip in the stomach.  Other findings as described. Electronically Signed   By: Ofilia Neas M.D.   On: 08/31/2021 12:39     LOS: 6 days   Antonieta Pert, MD [facility]  09/02/2021, 8:47 AM

## 2021-09-02 NOTE — TOC Initial Note (Signed)
Transition of Care [facility]) - Initial/Assessment Note    Patient Details  Name: Jane [name] MRN: [id number] Date of Birth: 05-30-1966  [facility]) CM/SW Contact:    Tom-Johnson, Renea Ee, RN Phone Number: 09/02/2021, 5:02 PM  Clinical Narrative:                 CM went to speak with patient about discharge disposition and home health recommendations. Patient is on Droplet precautions. CM called Roetta Sessions  [phone]) listed as contact several times unsuccessful and left secure message to call CM.  From home with sister. Presented with N/V, weakness and cough x 1 week. Found to be septic and has Peritonitis. Has hx of ESRD, on Peritoneal dialysis at home,followed by [facility]. Recent surgery in April and has been in bed and wheelchair bound. Able to feed and dress self. PT/OT recommending home health services. CM called and spoke with Tommi Rumps with [facility] and referral made with acceptance voiced. CM will continue to follow with needs.   Expected Discharge Plan: [place] Barriers to Discharge: Continued Medical Work up   Patient Goals and CMS Choice Patient states their goals for this hospitalization and ongoing recovery are:: To go home CMS Medicare.gov Compare Post Acute Care list provided to:: Patient    Expected Discharge Plan and Services Expected Discharge Plan: [place]   Discharge Planning Services: CM Consult   Living arrangements for the past 2 months: [place] Agency: [place] Date [facility] Agency Contacted: 09/02/21 Time HH Agency Contacted: 1700 Representative spoke with at [place]: Tommi Rumps  Prior Living Arrangements/Services Living arrangements for the past 2 months: [place] Lives with:: Siblings (Sister) Patient language and need for interpreter reviewed:: Yes Do you feel safe going back to the place where you live?: Yes      Need  for Family Participation in Patient Care: Yes (Comment) Care giver support system in place?: Yes (comment)   Criminal Activity/Legal Involvement Pertinent to Current Situation/Hospitalization: No - Comment as needed  Activities of Daily Living      Permission Sought/Granted Permission sought to share information with : Case Manager, Customer service manager, Family Supports Permission granted to share information with : Yes, Verbal Permission Granted              Emotional Assessment Appearance:: Appears stated age Attitude/Demeanor/Rapport: Other (comment) Affect (typically observed): Other (comment)   Alcohol / Substance Use: Not Applicable Psych Involvement: No (comment)  Admission diagnosis:  Nausea vomiting and diarrhea [R11.2, R19.7] Septic shock (HCC) [A41.9, R65.21] Sepsis ([place]) [A41.9] Hypotension, unspecified hypotension type [I95.9] Sepsis, due to unspecified organism, unspecified whether acute organ dysfunction present [facility]) [A41.9] Patient Active Problem List   Diagnosis Date Noted   Malnutrition of moderate degree 09/01/2021   Clostridium difficile carrier    Dialysis-associated peritonitis ([place])    Staphylococcus epidermidis bacteremia    Sepsis ([place]) 08/27/2021   Aspiration pneumonia of both lower lobes due to gastric secretions ([place]) 08/27/2021   Septic shock ([place]) 08/27/2021   Hypotension    Nausea vomiting and diarrhea    Pressure injury of skin 01/26/2021   Discitis of lumbosacral region 01/25/2021   Anemia 12/06/2018   Slurred speech 10/07/2017   OSA (obstructive sleep apnea) 07/25/2017   Seizure  disorder ([place]) 04/22/2016   End-stage renal disease on peritoneal dialysis [facility])    Partial seizure ([place])    Seizure ([place]) 09/14/2015   Essential hypertension 10/11/2010   FOOT PAIN, RIGHT 10/11/2010   BRONCHITIS, ACUTE WITH MILD BRONCHOSPASM 10/04/2010   ANKLE SPRAIN, RIGHT 02/20/2010   WEIGHT GAIN 06/17/2009   Dermatophytosis of foot  04/16/2009   VAGINAL DISCHARGE 04/16/2009   INSOMNIA 04/16/2009   POLYDIPSIA 04/16/2009   FREQUENCY, URINARY 04/16/2009   ANEMIA, IRON DEFICIENCY 12/03/2007   DYSLIPIDEMIA 06/11/2007   OBESITY NOS 06/11/2007   Schizoaffective disorder ([place]) 06/11/2007   DISORDER, BIPOLAR NOS 06/11/2007   RETARDATION, MENTAL NOS 06/11/2007   RENAL INSUFFICIENCY, CHRONIC 06/11/2007   ACNE NEC 06/11/2007   URINARY INCONTINENCE, URGE 06/11/2007   REDUCTION MAMMOPLASTY, HX OF 06/11/2007   PCP:  [name], MD Pharmacy:   [facility] [place], [place] - [address] [place] [address] Phone: [phone] Fax: [phone]     Social Determinants of Health (SDOH) Interventions    Readmission Risk Interventions No flowsheet data found.

## 2021-09-02 NOTE — Progress Notes (Signed)
Nutrition Follow-up  DOCUMENTATION CODES:   Non-severe (moderate) malnutrition in context of chronic illness  INTERVENTION:   Tube feeding via Cortrak tube: - Advance Vital 1.5 tube feeds by 10 ml q 8 hours to goal rate of 55 ml/hr (1320 ml/day) - Continue ProSource TF 45 ml BID  Tube feeding regimen at goal provides 2060 kcal, 111 grams of protein, and 1008 ml of H2O.  - Continue renal MVI daily per tube  - Encourage PO intake, family to bring in dysphagia 2 diet compliant food from home  NUTRITION DIAGNOSIS:   Moderate Malnutrition related to chronic illness (ESRD, CHF) as evidenced by mild fat depletion, moderate muscle depletion.  Ongoing, being addressed via TF  GOAL:   Patient will meet greater than or equal to 90% of their needs  Progressing with advancement of TF to goal  MONITOR:   PO intake, Labs, Weight trends, TF tolerance, Skin, I & O's  REASON FOR ASSESSMENT:   Consult Enteral/tube feeding initiation and management  ASSESSMENT:   55 year old female who presented to the ED on 11/04 with N/V/D x 1 week. PMH of ESRD on PD at home, HTN, HLD, schizophrenia, seizures, CHF, hypothyroidism, discitis/osteomyelitis and epidural abscess in April 2022. Pt admitted with sepsis, aspiration pneumonia, spontaneous bacterial peritonitis.  11/05 - transferred to ICU 11/06 - NPO 11/07 - diet advanced to dysphagia 2 with thin liquids 11/09 - Cortrak placed (tip gastric), tube feeds started at trickle rate 11/10 - pt with vomiting in early AM, tube feeds held, KUB showing ileus, reglan started, tube feeds restarted at trickle rate  Discussed pt with RN and during ICU rounds. Pt tolerating tube feeds at trickle rate without N/V. Discussed pt with MD. Faythe Ghee to advance tube feeds slowly to goal. Pt having bowel movements with rectal tube in place. Discussed plan with pt's sister at bedside.  Admit weight: 62.5 kg Current weight: 63 kg  Medications reviewed and include: IV  reglan 5 mg q 8 hours, rena-vit, nystatin 5 mL QID, protonix, klor-con 20 mEq BID, IV abx  Labs reviewed: sodium 130, potassium 3.3, albumin <1.5, hemoglobin 9.7, platelets 128 CBG's: 74-122 x 24 hours  I/O's: +3.0 L since admit  Diet Order:   Diet Order             DIET DYS 2 Room service appropriate? Yes; Fluid consistency: Thin  Diet effective now                   EDUCATION NEEDS:   Not appropriate for education at this time  Skin:  Skin Assessment:  Skin Integrity Issues: Other: MASD to groin and buttocks, wound to R heel  Last BM:  08/02/21 type 7 via rectal tube  Height:   Ht Readings from Last 1 Encounters:  08/28/21 5\' 4"  (1.626 m)    Weight:   Wt Readings from Last 1 Encounters:  09/02/21 63 kg    BMI:  Body mass index is 23.84 kg/m.  Estimated Nutritional Needs:   Kcal:  1900-2100  Protein:  90-110 grams  Fluid:  1000 ml + UOP    Gustavus Bryant, MS, RD, LDN Inpatient Clinical Dietitian Please see AMiON for contact information.

## 2021-09-02 NOTE — Evaluation (Signed)
Occupational Therapy Evaluation Patient Details Name: Jane [name] MRN: [id number] DOB: 05/30/1966 Today's Date: 09/02/2021   History of Present Illness Pt is a 55 y/o female admitted 08/27/21 with n/v/d, abdominal pain and cough. Found with sepsis, possible aspiration PNA, and cdiff. PMH includes: ESRD on PD, schizoaffective disorder,cognitive impairment, prior discitis/osteomyelitis/epiducal abscess.   Clinical Impression   PTA patient requires assist for most ADLs and transfers from family, her sister reports she was able to self feed, complete grooming and UB bathing/dressing.  She currently requires up to total assist for self care, total assist +2 for bed mobility.  She is able to sit EOB with min-min guard assist.  Believe she will benefit from further OT services while admitted and after dc at [facility] level to optimize return to PLOF and decrease burden of care.      Recommendations for follow up therapy are one component of a multi-disciplinary discharge planning process, led by the attending physician.  Recommendations may be updated based on patient status, additional functional criteria and insurance authorization.   Follow Up Recommendations  Home health OT    Assistance Recommended at Discharge Frequent or constant Supervision/Assistance  Functional Status Assessment  Patient has had a recent decline in their functional status and demonstrates the ability to make significant improvements in function in a reasonable and predictable amount of time.  Equipment Recommendations  None recommended by OT    Recommendations for Other Services       Precautions / Restrictions Precautions Precautions: Fall;Other (comment) Precaution Comments: cortrack Restrictions Weight Bearing Restrictions: No      Mobility Bed Mobility Overal bed mobility: Needs Assistance Bed Mobility: Supine to Sit;Sit to Supine     Supine to sit: +2 for physical assistance;Total assist Sit to  supine: +2 for physical assistance;Total assist   General bed mobility comments: Assist with all aspects. Pt reluctant to assist    Transfers Overall transfer level: Needs assistance Equipment used: 2 person hand held assist Transfers: Sit to/from Stand Sit to Stand: +2 physical assistance;Max assist           General transfer comment: Performed partial sit to stand with +2 max to bring hips up. Buttocks cleared bed ~12 inches      Balance Overall balance assessment: Needs assistance Sitting-balance support: Bilateral upper extremity supported;Feet unsupported Sitting balance-Leahy Scale: Poor Sitting balance - Comments: Pt sat EOB x 15 minutes with min guard to min assist Postural control: Right lateral lean;Posterior lean     Standing balance comment: Unable to fully stand with +2 max assist                           ADL either performed or assessed with clinical judgement   ADL Overall ADL's : Needs assistance/impaired Eating/Feeding: Maximal assistance;Sitting   Grooming: Total assistance;Bed level;Sitting   Upper Body Bathing: Total assistance;Sitting   Lower Body Bathing: Total assistance;+2 for physical assistance;+2 for safety/equipment;Bed level   Upper Body Dressing : Total assistance;Sitting   Lower Body Dressing: Total assistance;+2 for physical assistance;+2 for safety/equipment;Bed level     Toilet Transfer Details (indicate cue type and reason): deferred         Functional mobility during ADLs: Total assistance;+2 for physical assistance;+2 for safety/equipment General ADL Comments: pt able to assist minimally with grooming, overall total assist (able to initate but not complete tasks)     Vision         Perception  Praxis      Pertinent Vitals/Pain Pain Assessment: Faces Faces Pain Scale: Hurts even more Pain Location: Unable to indicate specifically. Per sister likely back. Pain Descriptors / Indicators:  Grimacing;Crying Pain Intervention(s): Limited activity within patient's tolerance;Monitored during session;Repositioned     Hand Dominance Right   Extremity/Trunk Assessment Upper Extremity Assessment Upper Extremity Assessment: Generalized weakness   Lower Extremity Assessment Lower Extremity Assessment: RLE deficits/detail;LLE deficits/detail RLE Deficits / Details: strength grossly 2-/5 LLE Deficits / Details: strength grossly 2-/5, Edematous       Communication Communication Communication: Expressive difficulties   Cognition Arousal/Alertness: Lethargic Behavior During Therapy: Flat affect Overall Cognitive Status: History of cognitive impairments - at baseline                                 General Comments: pt able to follow simple commands     General Comments  VSS on RA    Exercises     Shoulder Instructions      Home Living Family/patient expects to be discharged to:: Private residence Living Arrangements: Other (Comment) (sister and her husband) Available Help at Discharge: Family;Available 24 hours/day Type of Home: House Home Access: Stairs to enter [employer] of Steps: 4 Entrance Stairs-Rails: None Home Layout: Two level;Able to live on main level with bedroom/bathroom Alternate Level Stairs-Number of Steps: flight   Bathroom Shower/Tub: Sponge bathes at baseline   Bathroom Toilet: Standard     Home Equipment: BSC/3in1;Wheelchair - Publishing copy (2 wheels);Hospital bed          Prior Functioning/Environment Prior Level of Function : Needs assist  Cognitive Assist : Mobility (cognitive);ADLs (cognitive) Mobility (Cognitive): Step by step cues ADLs (Cognitive): Step by step cues Physical Assist : Mobility (physical);ADLs (physical) Mobility (physical): Bed mobility;Transfers ADLs (physical): Grooming;Bathing;Dressing;Toileting;IADLs Mobility Comments: sister reports pt able to help a little with bed  mobility and standing, pivots to w/c ADLs Comments: pt can wash face, feed self, complete UB care but otherwise needs assist from sister        OT Problem List: Decreased strength;Decreased activity tolerance;Impaired balance (sitting and/or standing);Decreased cognition;Decreased safety awareness;Decreased knowledge of use of DME or AE;Impaired UE functional use;Pain      OT Treatment/Interventions: Self-care/ADL training;Therapeutic exercise;Therapeutic activities;Balance training;Patient/family education    OT Goals(Current goals can be found in the care plan section) Acute Rehab OT Goals Patient Stated Goal: to get her back home OT Goal Formulation: With patient Time For Goal Achievement: 09/16/21 Potential to Achieve Goals: Good  OT Frequency: Min 2X/week   Barriers to D/C:            Co-evaluation PT/OT/SLP Co-Evaluation/Treatment: Yes Reason for Co-Treatment: Necessary to address cognition/behavior during functional activity;For patient/therapist safety;To address functional/ADL transfers PT goals addressed during session: Mobility/safety with mobility;Balance OT goals addressed during session: ADL's and self-care      AM-PAC OT "6 Clicks" Daily Activity     Outcome Measure Help from another person eating meals?: A Lot Help from another person taking care of personal grooming?: A Lot Help from another person toileting, which includes using toliet, bedpan, or urinal?: Total Help from another person bathing (including washing, rinsing, drying)?: Total Help from another person to put on and taking off regular upper body clothing?: Total Help from another person to put on and taking off regular lower body clothing?: Total 6 Click Score: 8   End of Session Equipment Utilized During Treatment: Gait belt Nurse  Communication: Mobility status  Activity Tolerance: Patient tolerated treatment well Patient left: in bed;with call bell/phone within reach;with bed alarm set;with  family/visitor present;Other (comment) (bed in chair position)  OT Visit Diagnosis: Other abnormalities of gait and mobility (R26.89);Muscle weakness (generalized) (M62.81)                Time: 9558-3167 OT Time Calculation (min): 40 min Charges:  OT General Charges $OT Visit: 1 Visit OT Evaluation $OT Eval Moderate Complexity: 1 Mod OT Treatments $Self Care/Home Management : 8-22 mins  Jolaine Artist, OT Acute Rehabilitation Services Pager [phone] Office [phone]   Delight Stare 09/02/2021, 10:30 AM

## 2021-09-02 NOTE — Progress Notes (Signed)
[name] Progress Note   Subjective:   Up working with therapy today - having back pain which is chronic for her.  Sister bedside today - no concerns re: ongoing home PD treatment which she does for Jane [name].  UF was ~348mL yesterday.   Objective Vitals:   09/02/21 0500 09/02/21 0530 09/02/21 0600 09/02/21 0729  BP: 106/65 104/75 (!) 97/58   Pulse: 96 91 99   Resp: (!) 24 (!) 24 (!) 28   Temp:    (!) 97.5 F (36.4 C)  TempSrc:    Axillary  SpO2: 94% 98% 94%   Weight:      Height:         Additional Objective Labs: Basic Metabolic Panel: Recent Labs  Lab 08/31/21 0311 08/31/21 1127 09/01/21 0637 09/01/21 1546 09/02/21 0534  NA 127*  --  128*  --  130*  K 3.3*  --  4.3  --  3.3*  CL 93*  --  97*  --  96*  CO2 18*  --  19*  --  20*  GLUCOSE 98  --  61*  --  82  BUN 36*  --  35*  --  34*  CREATININE 4.20*  --  4.13*  --  4.13*  CALCIUM 8.8*  --  8.8*  --  8.7*  PHOS 3.8   < > 4.3 4.6 4.2   < > = values in this interval not displayed.    CBC: Recent Labs  Lab 08/26/21 2229 08/27/21 0735 08/29/21 0245 08/30/21 0442 08/31/21 0311 09/01/21 0637 09/02/21 0536  WBC 10.1   < > 6.8 6.3 4.5 5.9 5.0  NEUTROABS 8.1*  --   --   --   --   --   --   HGB 10.3*   < > 9.9* 10.7* 10.2* 10.4* 9.7*  HCT 32.6*   < > 29.6* 31.3* 31.2* 31.6* 30.1*  MCV 91.8   < > 83.9 82.8 84.8 85.9 85.8  PLT 369   < > 191 164 160 136* 128*   < > = values in this interval not displayed.    Blood Culture    Component Value Date/Time   SDES BLOOD RIGHT HAND 08/29/2021 1233   SPECREQUEST  08/29/2021 1233    BOTTLES DRAWN AEROBIC AND ANAEROBIC Blood Culture adequate volume   CULT  08/29/2021 1233    NO GROWTH 4 DAYS Performed at [facility], [place] [address], [place], [place] [address]    REPTSTATUS PENDING 08/29/2021 1233     Physical Exam General: Alert, on edge of bed with therapy Heart: RRR No m,r,g Lungs: Clear bilaterally  Abdomen: soft, nontender  today Extremities: 1+ LLE and trace RLE - improved from yesterday Dialysis Access: PD cath in place   Medications:  sodium chloride Stopped (08/31/21 1917)   ampicillin-sulbactam (UNASYN) IV Stopped (09/01/21 2257)   dialysis solution 1.5% low-MG/low-CA     dialysis solution 2.5% low-MG/low-CA     feeding supplement (VITAL 1.5 CAL)     vancomycin Stopped (08/30/21 1253)    benztropine  1 mg Per Tube QHS   Chlorhexidine Gluconate Cloth  6 each Topical Daily   feeding supplement (PROSource TF)  45 mL Per Tube BID   FLUoxetine  20 mg Per Tube Daily   gentamicin cream  1 application Topical Daily   heparin injection (subcutaneous)  5,000 Units Subcutaneous Q8H   levETIRAcetam  500 mg Per Tube BID   levothyroxine  50 mcg Per  Tube Q breakfast   metoCLOPramide (REGLAN) injection  5 mg Intravenous Q8H   midodrine  5 mg Per Tube TID WC   multivitamin  1 tablet Per Tube QHS   nystatin  5 mL Per Tube QID   pantoprazole sodium  40 mg Per Tube Daily   potassium chloride  20 mEq Oral BID   risperiDONE  3 mg Per Tube QHS   sodium chloride flush  3 mL Intravenous Q12H   valproic acid  500 mg Per Tube BID    Dialysis Orders:  [facility]  [facility] [phone] (HT not open on weekends)  OP Nephrologist: Dimas Chyle  Ambulatory PD  7x/week  4 exchanges 2L    Assessment/Plan: Sepsis --Secondary to  aspiration PNA and peritonitis. +bld cx thought to be contaminant.   ID following - vanc +unasyn IV now.  PD fluid cell count after 48h therapy was improving and no e/o tunnel infection on exam.  Cont current therapy but add fungal peritonitis prophylaxis with nystatin po QID.  Plans 2 weeks vanc and 5d unasyn.  Hypotension/volume  - Hypotensive 2/2 shock. S/p volume resuscitation. Pressor support on admission. Midodrine started. BPs are at baseline now.  ESRD -  Will continue CCPD tonight - use 1.5%/2.5% tonight again.  Of note sister usually uses all 1.5% at home -- we have been doing a  combo some days here with LE edema which is variable. Anemia  - Hgb 8.8 >6.0 > 9s> 10s.     Metabolic bone disease -  Corr Ca/Phos ok.  Nutrition - Alb low.  Prot supp when taking PO Schizoaffective/Bipolar disorder - per primary  Hypokalemia - requiring ongoing repletion PRN.  Hyponatremia - chronic mild hyponatremia.  Stable at 127-130.  On NaCl tabs.   08/31/21 Spoke to RN at her dialysis clinic - has 2 family members (sister and BIL) who do her PD treatments for her and have been providing essentially total care to her recently.  Her BP chronically runs on the low side.  Her current clinical status doesn't seem far off her baseline actually, so I think it's reasonable to continue her PD.  I spoke to Jane [name]  her sister who agrees she and husband can continue the current level of care for Jane [name] and would prefer not to switch to HD.  Ongoing discussion as course progresses.  Confirmed 09/02/21 with sister bedside ongoing PD treatments are ok.   Jane Hick MD [facility] Pager [phone]

## 2021-09-02 NOTE — Progress Notes (Signed)
Pharmacy Antibiotic Note  Jane [name] is a 55 y.o. female admitted on 08/26/2021 with pneumonia.  Pharmacy has been consulted for vancomycin dosing. Patient is on continuous PD with 4 exchanges of 2 L and tolerating well. Blood and peritoneal fluid cultures positive for MRSE.   Vancomycin random 26 ug/mL not within range of 15-20 ug/mL after dose of 1 g IV 72h ago. Based on her kinetics, her dosing interval should be extended to ~4 days since her level was elevated and we are waiting for it to drop below 20 to redose. Expect vanc to accumulate so future dosing interval may need to be extended.  Plan: Vancomycin 1000 mg IV once 11/12 Follow-up vanc level as needed Monitor clinical improvement, renal function, and f/u cultures.  Height: 5\' 4"  (162.6 cm) Weight: 63 kg (138 lb 14.2 oz) IBW/kg (Calculated) : 54.7  Temp (24hrs), Avg:97 F (36.1 C), Min:96.2 F (35.7 C), Max:97.8 F (36.6 C)  Recent Labs  Lab 08/26/21 2348 08/27/21 0121 08/27/21 0735 08/27/21 1109 08/28/21 0250 08/29/21 0245 08/30/21 0442 08/31/21 0311 09/01/21 0637 09/02/21 0534 09/02/21 0536 09/02/21 0839  WBC  --   --    < >  --    < > 6.8 6.3 4.5 5.9  --  5.0  --   CREATININE  --   --    < >  --    < > 4.14* 4.22* 4.20* 4.13* 4.13*  --   --   LATICACIDVEN 3.4* 3.0*  --  1.0  --   --   --   --   --   --   --   --   VANCORANDOM  --   --   --   --   --   --  19  --   --   --   --  26   < > = values in this interval not displayed.     Estimated Creatinine Clearance: 13.4 mL/min (A) (by C-G formula based on SCr of 4.13 mg/dL (H)).    Allergies  Allergen Reactions   Icodextrin Rash    Antimicrobials this admission: Cefepime 11/5>>11/7 Vanco 11/5>>  Metronid 11/5>>11/7 Unasyn 11/7>> 11/11  Dose adjustments this admission: 11/5 1250 mg IV 11/8 VR = 19 > gave 1 g IV 11/11 VR = 26 11/12 give 1 g IV  Microbiology results: 11/5 Bcx: MRSE 11/5 Pleural fluid: MRSE 11/5 salmonella not  detected   Thank you for allowing pharmacy to be a part of this patient's care.  Varney Daily, PharmD PGY1 Pharmacy Resident  Please check AMION for all [facility] pharmacy phone numbers After 10:00 PM call main pharmacy [phone]

## 2021-09-03 DIAGNOSIS — A419 Sepsis, unspecified organism: Secondary | ICD-10-CM | POA: Diagnosis not present

## 2021-09-03 LAB — RENAL FUNCTION PANEL
Albumin: <1.5 g/dL — ABNORMAL LOW (ref 3.5–5.0)
Anion gap: 14 (ref 5–15)
BUN: 32 mg/dL — ABNORMAL HIGH (ref 6–20)
CO2: 19 mmol/L — ABNORMAL LOW (ref 22–32)
Calcium: 8.9 mg/dL (ref 8.9–10.3)
Chloride: 95 mmol/L — ABNORMAL LOW (ref 98–111)
Creatinine, Ser: 4.22 mg/dL — ABNORMAL HIGH (ref 0.44–1.00)
GFR, Estimated: 12 mL/min — ABNORMAL LOW (ref >60)
Glucose, Bld: 119 mg/dL — ABNORMAL HIGH (ref 70–99)
Phosphorus: 3.7 mg/dL (ref 2.5–4.6)
Potassium: 3.4 mmol/L — ABNORMAL LOW (ref 3.5–5.1)
Sodium: 128 mmol/L — ABNORMAL LOW (ref 135–145)

## 2021-09-03 LAB — GLUCOSE, CAPILLARY
Glucose-Capillary: 100 mg/dL — ABNORMAL HIGH (ref 70–99)
Glucose-Capillary: 109 mg/dL — ABNORMAL HIGH (ref 70–99)
Glucose-Capillary: 115 mg/dL — ABNORMAL HIGH (ref 70–99)
Glucose-Capillary: 75 mg/dL (ref 70–99)
Glucose-Capillary: 90 mg/dL (ref 70–99)
Glucose-Capillary: 94 mg/dL (ref 70–99)
Glucose-Capillary: 94 mg/dL (ref 70–99)

## 2021-09-03 LAB — CBC
HCT: 28.4 % — ABNORMAL LOW (ref 36.0–46.0)
Hemoglobin: 9.3 g/dL — ABNORMAL LOW (ref 12.0–15.0)
MCH: 28.2 pg (ref 26.0–34.0)
MCHC: 32.7 g/dL (ref 30.0–36.0)
MCV: 86.1 fL (ref 80.0–100.0)
Platelets: 132 10*3/uL — ABNORMAL LOW (ref 150–400)
RBC: 3.3 MIL/uL — ABNORMAL LOW (ref 3.87–5.11)
RDW: 24.3 % — ABNORMAL HIGH (ref 11.5–15.5)
WBC: 5 10*3/uL (ref 4.0–10.5)
nRBC: 0 % (ref 0.0–0.2)

## 2021-09-03 LAB — CULTURE, BLOOD (ROUTINE X 2)
Culture: NO GROWTH
Culture: NO GROWTH
Special Requests: ADEQUATE
Special Requests: ADEQUATE

## 2021-09-03 LAB — MAGNESIUM: Magnesium: 1.7 mg/dL (ref 1.7–2.4)

## 2021-09-03 MED ORDER — POTASSIUM CHLORIDE 20 MEQ PO PACK
40.0000 meq | PACK | Freq: Once | ORAL | Status: AC
  Administered 2021-09-03: 40 meq
  Filled 2021-09-03: qty 2

## 2021-09-03 MED ORDER — DELFLEX-LC/2.5% DEXTROSE 394 MOSM/L IP SOLN: INTRAPERITONEAL | Status: DC

## 2021-09-03 MED ORDER — CHOLESTYRAMINE LIGHT 4 G PO PACK: 4.0000 g | PACK | Freq: Three times a day (TID) | ORAL | Status: DC

## 2021-09-03 MED ORDER — MIDODRINE HCL 5 MG PO TABS
10.0000 mg | ORAL_TABLET | Freq: Three times a day (TID) | ORAL | Status: DC
  Administered 2021-09-03 – 2021-09-11 (×24): 10 mg
  Filled 2021-09-03 (×24): qty 2

## 2021-09-03 MED ORDER — NUTRISOURCE FIBER PO PACK
1.0000 | PACK | Freq: Two times a day (BID) | ORAL | Status: DC
Start: 2021-09-03 — End: 2021-09-05
  Administered 2021-09-03 – 2021-09-05 (×5): 1
  Filled 2021-09-03 (×5): qty 1

## 2021-09-03 NOTE — Progress Notes (Addendum)
[facility] Progress Note   Subjective:   Seen in room, still w/ diarrhea/ rectal tube. No c/o today  Objective Vitals:   09/03/21 0800 09/03/21 0900 09/03/21 0930 09/03/21 1000  BP: 114/75 107/72 102/79 102/70  Pulse: (!) 125 (!) 115 (!) 113 (!) 111  Resp: 19 (!) 27 (!) 26 (!) 27  Temp:      TempSrc:      SpO2: 93% 94% 94% 97%  Weight:      Height:         Additional Objective Labs: Basic Metabolic Panel: Recent Labs  Lab 09/01/21 0637 09/01/21 1546 09/02/21 0534 09/03/21 0153  NA 128*  --  130* 128*  K 4.3  --  3.3* 3.4*  CL 97*  --  96* 95*  CO2 19*  --  20* 19*  GLUCOSE 61*  --  82 119*  BUN 35*  --  34* 32*  CREATININE 4.13*  --  4.13* 4.22*  CALCIUM 8.8*  --  8.7* 8.9  PHOS 4.3 4.6 4.2 3.7    CBC: Recent Labs  Lab 08/30/21 0442 08/31/21 0311 09/01/21 0637 09/02/21 0536 09/03/21 0153  WBC 6.3 4.5 5.9 5.0 5.0  HGB 10.7* 10.2* 10.4* 9.7* 9.3*  HCT 31.3* 31.2* 31.6* 30.1* 28.4*  MCV 82.8 84.8 85.9 85.8 86.1  PLT 164 160 136* 128* 132*    Blood Culture    Component Value Date/Time   SDES BLOOD RIGHT HAND 08/29/2021 1233   SPECREQUEST  08/29/2021 1233    BOTTLES DRAWN AEROBIC AND ANAEROBIC Blood Culture adequate volume   CULT  08/29/2021 1233    NO GROWTH 5 DAYS Performed at [facility], [place] [address], [place], [place] [address]    REPTSTATUS 09/03/2021 FINAL 08/29/2021 1233     Physical Exam General: lethargic, lying in bed Heart: RRR No m,r,g Lungs: Clear bilaterally  Abdomen: soft, nontender today Rectal: rectal tube in place draining liquid brown stool Extremities: 1+ pitting hip edema L > R Dialysis Access: PD cath in place   Medications:  sodium chloride Stopped (08/31/21 1917)   dialysis solution 1.5% low-MG/low-CA     dialysis solution 2.5% low-MG/low-CA     feeding supplement (VITAL 1.5 CAL) 50 mL/hr at 09/03/21 0413   vancomycin      benztropine  1 mg Per Tube QHS   Chlorhexidine Gluconate Cloth  6  each Topical Daily   cholestyramine light  4 g Oral TID   feeding supplement (PROSource TF)  45 mL Per Tube BID   FLUoxetine  20 mg Per Tube Daily   gentamicin cream  1 application Topical Daily   heparin injection (subcutaneous)  5,000 Units Subcutaneous Q8H   levETIRAcetam  500 mg Per Tube BID   levothyroxine  50 mcg Per Tube Q breakfast   metoCLOPramide (REGLAN) injection  5 mg Intravenous Q8H   midodrine  10 mg Per Tube TID WC   multivitamin  1 tablet Per Tube QHS   nystatin  5 mL Per Tube QID   pantoprazole sodium  40 mg Per Tube Daily   risperiDONE  3 mg Per Tube QHS   sodium chloride flush  3 mL Intravenous Q12H   valproic acid  500 mg Per Tube BID   vancomycin variable dose per unstable renal function (pharmacist dosing)   Does not apply See admin instructions    Dialysis Orders:  [facility] [phone] ([facility] not open on weekends)  OP Nephrologist: Dimas Chyle  Ambulatory PD  7x/week  4 exchanges 2L    Assessment/Plan: Sepsis - due to asp PNA and PD cath related peritonitis Aspiration PNA - +bld cx thought to be contaminant.   ID signed off, getting course of IV unasyn for asp PNA. MRSE PD cath related acute peritonitis - f/u cell count showed sig improvement on 11/8. No e/o tunnel infection. Cont Vancomycin (IV here, IP when dc'd) for 2 wks total thru 11/18, per ID.  Hypotension/volume - had pressor support on admission, now off and getting midodrine 10 tid. BPs are at baseline now.  ESRD - will continue CCPD nightly, use all 2.5% fluids tonight w/ hip edema Anemia  - Hgb 8s > 9s> 10s.     Metabolic bone disease -  Corr Ca/Phos ok.  Nutrition - Alb low.  Prot supp when taking PO Schizoaffective/Bipolar disorder - per primary  Hypokalemia - requiring ongoing repletion PRN.    08/31/21 Dr Johnney Ou spoke to RN at her dialysis clinic - has 2 family members (sister and BIL) who do her PD treatments for her and have been providing essentially total care to  her recently.  Her BP chronically runs on the low side.  Her current clinical status doesn't seem far off her baseline actually, so I think it's reasonable to continue her PD.  I spoke to [name]  her sister who agrees she and husband can continue the current level of care for Tylie and would prefer not to switch to HD.  Ongoing discussion as course progresses.  Confirmed 09/02/21 with sister bedside ongoing PD treatments are ok.   Kelly Splinter, MD 09/03/2021, 10:33 AM

## 2021-09-03 NOTE — Plan of Care (Signed)

## 2021-09-03 NOTE — Progress Notes (Signed)
PROGRESS NOTE    Jane [name]  HCW:[id number] DOB: 09-Sep-1966 DOA: 08/26/2021 PCP: [name], MD   Chief Complaint  Patient presents with   Emesis   Brief Narrative/Hospital Course:  Jane [name], 55 y.o. female with PMH of ESRD on PD, size affective disorder, history of discitis/osteomyelitis/epidural abscess earlier this year presented to the ED with nausea, vomiting, diarrhea, abdominal pain, cough. Per sister patient at baseline is WC bound since back surgery in march this year,lives with her sister and has been dealing with her disabling psych issues for a long time. In the ED work-up showed septic shock, acute encephalopathy, possible aspiration pneumonia, and patient was admitted to ICU due to persistent lactic acidosis managed with pressor support. Seen by nephrology ID.  CT abdomen pelvis bilateral lower lobe pneumonia or aspiration, small ascites. Patient was treated for SBP secondary to MRSE- from, PD catheter and aspiration pneumonia.  She has been on NG tube feeding refusing to take orally and declining speech eval/ not tolerating PO. NG tube in place rectal tube in place and PD cathether in place in ICU Transferred to [facility] 11/11.  Patient is continued on NG tube feeding, with poor oral intake To take medication, speech following.  Has had soft blood pressure and tachycardia remain on midodrine, on PD dialysis.  Subjective: Afebrile overnight blood pressure soft this morning 89/64 Patient is more lethargic with sister at the bedside reports she has not eaten anything and has been refusing.  RN reports patient having fecal output on rectal tube.  heart rate 110-120s at times. Labs showed sodium 128.  Hypokalemia 3.4  bicarb 19 Seen by nephrology this morning  Assessment & Plan:  Septic shock due to SBP SBP 2/2 MRSA-PD catheter associated: Aspiration pneumonia: Repeat PF analysis 11/8 significant improvement in WBC count no evidence of exit site/tunnel  infection.  Appreciate ID input, repeat blood culture 08/29/21 no growth. Plan is to complete 2 weeks of vancomycin, completed Unasyn, NEPHRO started on fungal peritonitis prophylaxis with nystatin p.o. 4 times daily.  Remains tachycardic soft blood pressure on midodrine increase to 10 mg p.o. TID, monitor vs  ESRD on PD: Nephrology on board continue dialysis, family able to manage.  Home Hypokalemia replete again  with kcl Hyponatremia: Sodium remains low at 128-130.Plan per nephrology.   Recent Labs  Lab 08/30/21 0442 08/31/21 0311 09/01/21 0637 09/02/21 0534 09/03/21 0153  NA 127* 127* 128* 130* 128*    FEN Ileus NGT+ Hypoglycemia:  Reglan started 11/10, tolerating tube feed RD increasing to goal slowl  On DYS 2 diet- SLP to f/u and once able to take p.o. we will wean down TF.serum cortisol was adequate at 19.5 on 11/5. Sugar stable  Recent Labs  Lab 09/02/21 1938 09/03/21 0012 09/03/21 0516 09/03/21 0748 09/03/21 1124  GLUCAP 112* 115* 94 100* 75    Sinus tachycardia Soft BP: Suspect ongoing issue with her hemodynamics in the setting of PAD, diarrhea, sepsis on midodrine  Diarrhea with C. difficile antigen positive toxin negative, indeterminate results.  Rectal tube in place- stool op 331ml.discussed with ID no indication for treatment for C. difficile at this time okay to add Imodium/cholestyramine or fiber pharmacy consulted   MRSE and GPR positive in 1/2 sets felt to be contaminants with 2 different organism from single set.  Blood pressure soft/hypotension Sister reports lately she has been having low blood pressure.  Continue midodrine-increase from 5 to 10 mg  Acute metabolic encephalopathy: Patient lethargic-not much communicative in the  setting of multiple cognitive and psychiatric comorbidities, acute illness.  If continues to do poorly will consult palliative care.  Overall does not have good prognosis  Schizoaffective disorder/bipolar disorder poa Multiple  cognitive and psychiatric comorbidities: Continue patient home meds-Cogentin, fluoxetine, Risperdal, Keppra. Cont supportive care  Hypothyroidism:Cont Synthroid.  History of discitis/epidural abscess s/p laminectomy in 01/25/2021 with MSSA completed 8 weeks of antibiotics.Sister reports patient is wheelchair-bound since her surgery able to dress herself and feed herself-but at this time she is total assist/total care for ADLs  Anemia-likely multifactorial from chronic renal disease.  10.3 g on admission 11/4 dropped to as low as 6.0 gm on 11/5- s/p 2 units prbcs. Was FOBT positive 11/5 although no evidence of active GI bleeding.  Hemoglobin holding stable since then. On PPI. Repeat hemoccult done and is negative 11/11-no signs of active bleeding.  May need GI evaluation at some point likely outpatient.  Continue to address anemia in the setting of her ESRD Recent Labs  Lab 08/30/21 0442 08/31/21 0311 09/01/21 0637 09/02/21 0536 09/03/21 0153  HGB 10.7* 10.2* 10.4* 9.7* 9.3*  HCT 31.3* 31.2* 31.6* 30.1* 28.4*    Malnutrition of moderate degree: RD following, continue to feed speech eval, on dysphagia diet Nutrition Problem: Moderate Malnutrition Etiology: chronic illness (ESRD, CHF) Signs/Symptoms: mild fat depletion, moderate muscle depletion Interventions: Tube feeding, Prostat, MVI   Pressure injury of right sacrum stage II POA supportive care wound care as below Pressure Injury 01/25/21 Sacrum Right;Left Stage 2 -  Partial thickness loss of dermis presenting as a shallow open injury with a red, pink wound bed without slough. (Active)  01/25/21 1951  Location: Sacrum  Location Orientation: Right;Left  Staging: Stage 2 -  Partial thickness loss of dermis presenting as a shallow open injury with a red, pink wound bed without slough. (old pressure injury that is healing)  Wound Description (Comments):   Present on Admission: Yes   DVT prophylaxis: heparin injection 5,000 Units Start:  08/30/21 1400 Code Status:   Code Status: Full Code Family Communication: plan of care discussed with patient's sister at bedside. Status is: Inpatient Remains inpatient appropriate because: For ongoing management of sepsis, poor poor oral intake ileus. Currently medically not stable.   This patient is planning for return home.  Sister reports they are not planning for facility placement    Objective: Vitals last 24 hrs: Vitals:   09/03/21 0800 09/03/21 0900 09/03/21 0930 09/03/21 1000  BP: 114/75 107/72 102/79 102/70  Pulse: (!) 125 (!) 115 (!) 113 (!) 111  Resp: 19 (!) 27 (!) 26 (!) 27  Temp:      TempSrc:      SpO2: 93% 94% 94% 97%  Weight:      Height:       Weight change: -0.8 kg  Intake/Output Summary (Last 24 hours) at 09/03/2021 1132 Last data filed at 09/03/2021 1000 Gross per 24 hour  Intake 9104.83 ml  Output 9093 ml  Net 11.83 ml   Net IO Since Admission: 3,100.11 mL [09/03/21 1132]   Physical Examination: General exam: Drowsy briefly opens eyes, responds to pain thin frail, sick looking, older than stated age HEENT:Oral mucosa moist, Ear/Nose WNL grossly, dentition normal. NGT+ Respiratory system: bilaterally clear,no use of accessory muscle Cardiovascular system: S1 & S2 +, No JVD,. Gastrointestinal system: Abdomen soft,tender,PD cath +,ND, BS+ Nervous System:Alert, awake, moving extremities and grossly nonfocal Extremities: no edema, distal peripheral pulses palpable.  Skin: No rashes,no icterus. MSK: Normal muscle bulk,tone, power  Rectal tube in place  Medications reviewed:  Scheduled Meds:  benztropine  1 mg Per Tube QHS   Chlorhexidine Gluconate Cloth  6 each Topical Daily   feeding supplement (PROSource TF)  45 mL Per Tube BID   fiber  1 packet Per Tube BID   FLUoxetine  20 mg Per Tube Daily   gentamicin cream  1 application Topical Daily   heparin injection (subcutaneous)  5,000 Units Subcutaneous Q8H   levETIRAcetam  500 mg Per Tube BID    levothyroxine  50 mcg Per Tube Q breakfast   metoCLOPramide (REGLAN) injection  5 mg Intravenous Q8H   midodrine  10 mg Per Tube TID WC   multivitamin  1 tablet Per Tube QHS   nystatin  5 mL Per Tube QID   pantoprazole sodium  40 mg Per Tube Daily   potassium chloride  40 mEq Per Tube Once   risperiDONE  3 mg Per Tube QHS   sodium chloride flush  3 mL Intravenous Q12H   valproic acid  500 mg Per Tube BID   vancomycin variable dose per unstable renal function (pharmacist dosing)   Does not apply See admin instructions   Continuous Infusions:  sodium chloride Stopped (08/31/21 1917)   dialysis solution 2.5% low-MG/low-CA     feeding supplement (VITAL 1.5 CAL) 50 mL/hr at 09/03/21 0413   vancomycin     Diet Order             DIET DYS 2 Room service appropriate? Yes; Fluid consistency: Thin  Diet effective now                   Nutrition Problem: Moderate Malnutrition Etiology: chronic illness (ESRD, CHF) Signs/Symptoms: mild fat depletion, moderate muscle depletion Interventions: Tube feeding, Prostat, MVI  Weight change: -0.8 kg  Wt Readings from Last 3 Encounters:  09/03/21 63 kg  06/20/21 58.9 kg  02/04/21 58.9 kg     Consultants:see note  Procedures:see note Antimicrobials: Anti-infectives (From admission, onward)    Start     Dose/Rate Route Frequency Ordered Stop   09/03/21 1500  vancomycin (VANCOREADY) IVPB 1000 mg/200 mL        1,000 mg 200 mL/hr over 60 Minutes Intravenous  Once 09/02/21 1534     09/02/21 2008  vancomycin variable dose per unstable renal function (pharmacist dosing)         Does not apply See admin instructions 09/02/21 2008     08/30/21 1100  vancomycin (VANCOREADY) IVPB 1000 mg/200 mL  Status:  Discontinued        1,000 mg 200 mL/hr over 60 Minutes Intravenous every 72 hours 08/30/21 0928 09/02/21 1534   08/29/21 2200  Ampicillin-Sulbactam (UNASYN) 3 g in sodium chloride 0.9 % 100 mL IVPB        3 g 200 mL/hr over 30 Minutes  Intravenous Every 24 hours 08/29/21 1239 09/02/21 2255   08/28/21 2200  ceFEPIme (MAXIPIME) 2 g in sodium chloride 0.9 % 100 mL IVPB  Status:  Discontinued        2 g 200 mL/hr over 30 Minutes Intravenous Every 48 hours 08/27/21 0348 08/29/21 1239   08/28/21 0051  vancomycin variable dose per unstable renal function (pharmacist dosing)  Status:  Discontinued         Does not apply See admin instructions 08/28/21 0052 08/30/21 0936   08/27/21 0330  metroNIDAZOLE (FLAGYL) IVPB 500 mg  Status:  Discontinued        500  mg 100 mL/hr over 60 Minutes Intravenous Every 12 hours 08/27/21 0328 08/29/21 1239   08/27/21 0315  vancomycin (VANCOREADY) IVPB 1500 mg/300 mL        1,500 mg 150 mL/hr over 120 Minutes Intravenous  Once 08/27/21 0303 08/27/21 0654   08/27/21 0215  ceFEPIme (MAXIPIME) 2 g in sodium chloride 0.9 % 100 mL IVPB        2 g 200 mL/hr over 30 Minutes Intravenous  Once 08/27/21 0202 08/27/21 0327      Culture/Microbiology    Component Value Date/Time   SDES BLOOD RIGHT HAND 08/29/2021 1233   SPECREQUEST  08/29/2021 1233    BOTTLES DRAWN AEROBIC AND ANAEROBIC Blood Culture adequate volume   CULT  08/29/2021 1233    NO GROWTH 5 DAYS Performed at [facility], [place] [address] [place],  [address]    REPTSTATUS 09/03/2021 FINAL 08/29/2021 1233    Other culture-see note  Unresulted Labs (From admission, onward)     Start     Ordered   09/04/21 9242  Basic metabolic panel  Tomorrow morning,   R       Question:  Specimen collection method  Answer:  Lab=Lab collect   09/03/21 0821   09/02/21 0536  CBC  Daily,   R     Question:  Specimen collection method  Answer:  Lab=Lab collect   09/02/21 0535           Data Reviewed: I have personally reviewed following labs and imaging studies CBC: Recent Labs  Lab 08/30/21 0442 08/31/21 0311 09/01/21 0637 09/02/21 0536 09/03/21 0153  WBC 6.3 4.5 5.9 5.0 5.0  HGB 10.7* 10.2* 10.4* 9.7* 9.3*  HCT 31.3* 31.2* 31.6*  30.1* 28.4*  MCV 82.8 84.8 85.9 85.8 86.1  PLT 164 160 136* 128* 683*   Basic Metabolic Panel: Recent Labs  Lab 08/30/21 0442 08/31/21 0311 08/31/21 1127 08/31/21 1813 09/01/21 0637 09/01/21 1546 09/02/21 0534 09/03/21 0153  NA 127* 127*  --   --  128*  --  130* 128*  K 3.5 3.3*  --   --  4.3  --  3.3* 3.4*  CL 94* 93*  --   --  97*  --  96* 95*  CO2 18* 18*  --   --  19*  --  20* 19*  GLUCOSE 88 98  --   --  61*  --  82 119*  BUN 40* 36*  --   --  35*  --  34* 32*  CREATININE 4.22* 4.20*  --   --  4.13*  --  4.13* 4.22*  CALCIUM 8.7* 8.8*  --   --  8.8*  --  8.7* 8.9  MG 1.7 2.2 2.1 2.1 2.0 2.0  --  1.7  PHOS 4.0 3.8 4.2 4.1 4.3 4.6 4.2 3.7   GFR: Estimated Creatinine Clearance: 13.2 mL/min (A) (by C-G formula based on SCr of 4.22 mg/dL (H)). Liver Function Tests: Recent Labs  Lab 08/28/21 0250 09/02/21 0534 09/03/21 0153  AST 11*  --   --   ALT 8  --   --   ALKPHOS 100  --   --   BILITOT 0.2*  --   --   PROT 4.5*  --   --   ALBUMIN 2.4* <1.5* <1.5*   No results for input(s): LIPASE, AMYLASE in the last 168 hours.  No results for input(s): AMMONIA in the last 168 hours. Coagulation Profile: No results for input(s):  INR, PROTIME in the last 168 hours. Cardiac Enzymes: No results for input(s): CKTOTAL, CKMB, CKMBINDEX, TROPONINI in the last 168 hours. BNP (last 3 results) No results for input(s): PROBNP in the last 8760 hours. HbA1C: No results for input(s): HGBA1C in the last 72 hours. CBG: Recent Labs  Lab 09/02/21 1938 09/03/21 0012 09/03/21 0516 09/03/21 0748 09/03/21 1124  GLUCAP 112* 115* 94 100* 75   Lipid Profile: No results for input(s): CHOL, HDL, LDLCALC, TRIG, CHOLHDL, LDLDIRECT in the last 72 hours. Thyroid Function Tests: No results for input(s): TSH, T4TOTAL, FREET4, T3FREE, THYROIDAB in the last 72 hours. Anemia Panel: Recent Labs    09/02/21 1210  VITAMINB12 2,579*  FOLATE 10.2  FERRITIN 1,486*  TIBC <70*  IRON 49  RETICCTPCT  1.1   Sepsis Labs: Recent Labs  Lab 08/28/21 0250 08/29/21 0245  PROCALCITON 1.12 1.34    Recent Results (from the past 240 hour(s))  Resp Panel by RT-PCR (Flu A&B, Covid) Nasopharyngeal Swab     Status: None   Collection Time: 08/26/21 10:54 PM   Specimen: Nasopharyngeal Swab; Nasopharyngeal(NP) swabs in vial transport medium  Result Value Ref Range Status   SARS Coronavirus 2 by RT PCR NEGATIVE NEGATIVE Final    Comment: (NOTE) SARS-CoV-2 target nucleic acids are NOT DETECTED.  The SARS-CoV-2 RNA is generally detectable in upper respiratory specimens during the acute phase of infection. The lowest concentration of SARS-CoV-2 viral copies this assay can detect is 138 copies/mL. A negative result does not preclude SARS-Cov-2 infection and should not be used as the sole basis for treatment or other patient management decisions. A negative result may occur with  improper specimen collection/handling, submission of specimen other than nasopharyngeal swab, presence of viral mutation(s) within the areas targeted by this assay, and inadequate number of viral copies(<138 copies/mL). A negative result must be combined with clinical observations, patient history, and epidemiological information. The expected result is Negative.  Fact Sheet for Patients:  EntrepreneurPulse.com.au  Fact Sheet for Healthcare Providers:  IncredibleEmployment.be  This test is no t yet approved or cleared by the [place] FDA and  has been authorized for detection and/or diagnosis of SARS-CoV-2 by FDA under an Emergency Use Authorization (EUA). This EUA will remain  in effect (meaning this test can be used) for the duration of the COVID-19 declaration under Section 564(b)(1) of the Act, 21 U.S.C.section 360bbb-3(b)(1), unless the authorization is terminated  or revoked sooner.       Influenza A by PCR NEGATIVE NEGATIVE Final   Influenza B by PCR NEGATIVE  NEGATIVE Final    Comment: (NOTE) The Xpert Xpress SARS-CoV-2/FLU/RSV plus assay is intended as an aid in the diagnosis of influenza from Nasopharyngeal swab specimens and should not be used as a sole basis for treatment. Nasal washings and aspirates are unacceptable for Xpert Xpress SARS-CoV-2/FLU/RSV testing.  Fact Sheet for Patients: EntrepreneurPulse.com.au  Fact Sheet for Healthcare Providers: IncredibleEmployment.be  This test is not yet approved or cleared by the [place] FDA and has been authorized for detection and/or diagnosis of SARS-CoV-2 by FDA under an Emergency Use Authorization (EUA). This EUA will remain in effect (meaning this test can be used) for the duration of the COVID-19 declaration under Section 564(b)(1) of the Act, 21 U.S.C. section 360bbb-3(b)(1), unless the authorization is terminated or revoked.  Performed at [facility], [place] [address], [place], [place] [address]   Blood culture (routine x 2)     Status: Abnormal   Collection Time:  08/27/21  2:00 AM   Specimen: BLOOD  Result Value Ref Range Status   Specimen Description BLOOD RIGHT ARM  Final   Special Requests   Final    BOTTLES DRAWN AEROBIC AND ANAEROBIC Blood Culture results may not be optimal due to an excessive volume of blood received in culture bottles   Culture  Setup Time   Final    GRAM POSITIVE COCCI AEROBIC BOTTLE ONLY CRITICAL RESULT CALLED TO, READ BACK BY AND VERIFIED WITH: PHARMD GREG ABBOTT 08/28/21@00 :50 BY TW IN BOTH AEROBIC AND ANAEROBIC BOTTLES    Culture (A)  Final    STAPHYLOCOCCUS EPIDERMIDIS DIPHTHEROIDS(CORYNEBACTERIUM SPECIES) Standardized susceptibility testing for this organism is not available. Performed at [facility], [place] [address] [place], [place] [address]    Report Status 08/31/2021 FINAL  Final   Organism ID, Bacteria STAPHYLOCOCCUS EPIDERMIDIS  Final      Susceptibility   Staphylococcus  epidermidis - MIC*    CIPROFLOXACIN 1 SENSITIVE Sensitive     ERYTHROMYCIN <=0.25 SENSITIVE Sensitive     GENTAMICIN 8 INTERMEDIATE Intermediate     OXACILLIN >=4 RESISTANT Resistant     TETRACYCLINE 2 SENSITIVE Sensitive     VANCOMYCIN 2 SENSITIVE Sensitive     TRIMETH/SULFA 160 RESISTANT Resistant     CLINDAMYCIN <=0.25 SENSITIVE Sensitive     RIFAMPIN <=0.5 SENSITIVE Sensitive     Inducible Clindamycin NEGATIVE Sensitive     * STAPHYLOCOCCUS EPIDERMIDIS  Blood Culture ID Panel (Reflexed)     Status: Abnormal   Collection Time: 08/27/21  2:00 AM  Result Value Ref Range Status   Enterococcus faecalis NOT DETECTED NOT DETECTED Final   Enterococcus Faecium NOT DETECTED NOT DETECTED Final   Listeria monocytogenes NOT DETECTED NOT DETECTED Final   Staphylococcus species DETECTED (A) NOT DETECTED Final    Comment: CRITICAL RESULT CALLED TO, READ BACK BY AND VERIFIED WITH: PHARMD GREG ABBOTT 08/28/21@00 :50 BY TW    Staphylococcus aureus (BCID) NOT DETECTED NOT DETECTED Final   Staphylococcus epidermidis DETECTED (A) NOT DETECTED Final    Comment: Methicillin (oxacillin) resistant coagulase negative staphylococcus. Possible blood culture contaminant (unless isolated from more than one blood culture draw or clinical case suggests pathogenicity). No antibiotic treatment is indicated for blood  culture contaminants. CRITICAL RESULT CALLED TO, READ BACK BY AND VERIFIED WITH: PHARMD GREG ABBOTT 08/28/21@00 :50 BY TW    Staphylococcus lugdunensis NOT DETECTED NOT DETECTED Final   Streptococcus species NOT DETECTED NOT DETECTED Final   Streptococcus agalactiae NOT DETECTED NOT DETECTED Final   Streptococcus pneumoniae NOT DETECTED NOT DETECTED Final   Streptococcus pyogenes NOT DETECTED NOT DETECTED Final   A.calcoaceticus-baumannii NOT DETECTED NOT DETECTED Final   Bacteroides fragilis NOT DETECTED NOT DETECTED Final   Enterobacterales NOT DETECTED NOT DETECTED Final   Enterobacter cloacae  complex NOT DETECTED NOT DETECTED Final   Escherichia coli NOT DETECTED NOT DETECTED Final   Klebsiella aerogenes NOT DETECTED NOT DETECTED Final   Klebsiella oxytoca NOT DETECTED NOT DETECTED Final   Klebsiella pneumoniae NOT DETECTED NOT DETECTED Final   Proteus species NOT DETECTED NOT DETECTED Final   Salmonella species NOT DETECTED NOT DETECTED Final   Serratia marcescens NOT DETECTED NOT DETECTED Final   Haemophilus influenzae NOT DETECTED NOT DETECTED Final   Neisseria meningitidis NOT DETECTED NOT DETECTED Final   Pseudomonas aeruginosa NOT DETECTED NOT DETECTED Final   Stenotrophomonas maltophilia NOT DETECTED NOT DETECTED Final   Candida albicans NOT DETECTED NOT DETECTED Final   Candida auris NOT  DETECTED NOT DETECTED Final   Candida glabrata NOT DETECTED NOT DETECTED Final   Candida krusei NOT DETECTED NOT DETECTED Final   Candida parapsilosis NOT DETECTED NOT DETECTED Final   Candida tropicalis NOT DETECTED NOT DETECTED Final   Cryptococcus neoformans/gattii NOT DETECTED NOT DETECTED Final   Methicillin resistance mecA/C DETECTED (A) NOT DETECTED Final    Comment: CRITICAL RESULT CALLED TO, READ BACK BY AND VERIFIED WITH: PHARMD GREG ABBOTT 08/28/21@00 :50 BY TW Performed at [facility] [address] [place], [place] [address]   MRSA Next Gen by PCR, Nasal     Status: None   Collection Time: 08/27/21  3:29 AM   Specimen: Nasal Mucosa; Nasal Swab  Result Value Ref Range Status   MRSA by PCR Next Gen NOT DETECTED NOT DETECTED Final    Comment: (NOTE) The GeneXpert MRSA Assay (FDA approved for NASAL specimens only), is one component of a comprehensive MRSA colonization surveillance program. It is not intended to diagnose MRSA infection nor to guide or monitor treatment for MRSA infections. Test performance is not FDA approved in patients less than 26 years old. Performed at [facility], [place] [address], [place], [place] [address]   Blood culture (routine  x 2)     Status: None   Collection Time: 08/27/21  7:57 AM   Specimen: BLOOD RIGHT FOREARM  Result Value Ref Range Status   Specimen Description BLOOD RIGHT FOREARM  Final   Special Requests   Final    BOTTLES DRAWN AEROBIC AND ANAEROBIC Blood Culture adequate volume   Culture   Final    NO GROWTH 5 DAYS Performed at [facility], [place] [address], [place], [place] [address]    Report Status 09/01/2021 FINAL  Final  Body fluid culture w Gram Stain     Status: None   Collection Time: 08/27/21  2:16 PM   Specimen: Peritoneal Washings; Body Fluid  Result Value Ref Range Status   Specimen Description PERITONEAL FLUID  Final   Special Requests NONE  Final   Gram Stain   Final    WBC PRESENT,BOTH PMN AND MONONUCLEAR GRAM POSITIVE COCCI CYTOSPIN SMEAR Performed at [facility], [address], [place],  [address]    Culture ABUNDANT STAPHYLOCOCCUS EPIDERMIDIS  Final   Report Status 08/30/2021 FINAL  Final   Organism ID, Bacteria STAPHYLOCOCCUS EPIDERMIDIS  Final      Susceptibility   Staphylococcus epidermidis - MIC*    CIPROFLOXACIN 2 INTERMEDIATE Intermediate     ERYTHROMYCIN <=0.25 SENSITIVE Sensitive     GENTAMICIN 4 SENSITIVE Sensitive     OXACILLIN >=4 RESISTANT Resistant     TETRACYCLINE <=1 SENSITIVE Sensitive     VANCOMYCIN 1 SENSITIVE Sensitive     TRIMETH/SULFA 20 SENSITIVE Sensitive     CLINDAMYCIN <=0.25 SENSITIVE Sensitive     RIFAMPIN <=0.5 SENSITIVE Sensitive     Inducible Clindamycin NEGATIVE Sensitive     * ABUNDANT STAPHYLOCOCCUS EPIDERMIDIS  C Difficile Quick Screen w PCR reflex     Status: Abnormal   Collection Time: 08/28/21 10:25 AM   Specimen: STOOL  Result Value Ref Range Status   C Diff antigen POSITIVE (A) NEGATIVE Final    Comment: RESULTS REPORTED TO RN BEABRAUT 08/28/21 AT 1120 BY NM   C Diff toxin NEGATIVE NEGATIVE Final   C Diff interpretation Results are indeterminate. See PCR results.  Final    Comment: Performed at [facility], [place]  [address], [place], [place] [address]  C. Diff by PCR, Reflexed     Status: Abnormal   Collection Time: 08/28/21 10:25 AM  Result Value Ref Range Status   Toxigenic C. Difficile by PCR POSITIVE (A) NEGATIVE Final    Comment: Positive for toxigenic C. difficile with little to no toxin production. Only treat if clinical presentation suggests symptomatic illness. Performed at [facility], [place] [address] [place], [place] [address]   Culture, blood (routine x 2)     Status: None   Collection Time: 08/29/21 12:24 PM   Specimen: BLOOD LEFT HAND  Result Value Ref Range Status   Specimen Description BLOOD LEFT HAND  Final   Special Requests   Final    BOTTLES DRAWN AEROBIC AND ANAEROBIC Blood Culture adequate volume   Culture   Final    NO GROWTH 5 DAYS Performed at [facility], [place] [address] [place], [place] [address]    Report Status 09/03/2021 FINAL  Final  Culture, blood (routine x 2)     Status: None   Collection Time: 08/29/21 12:33 PM   Specimen: BLOOD RIGHT HAND  Result Value Ref Range Status   Specimen Description BLOOD RIGHT HAND  Final   Special Requests   Final    BOTTLES DRAWN AEROBIC AND ANAEROBIC Blood Culture adequate volume   Culture   Final    NO GROWTH 5 DAYS Performed at [facility], [place] [address], [place], [place] [address]    Report Status 09/03/2021 FINAL  Final     Radiology Studies: No results found.   LOS: 7 days   Antonieta Pert, MD [facility]  09/03/2021, 11:32 AM

## 2021-09-04 ENCOUNTER — Inpatient Hospital Stay (HOSPITAL_COMMUNITY): Payer: Medicare Other

## 2021-09-04 DIAGNOSIS — A419 Sepsis, unspecified organism: Secondary | ICD-10-CM | POA: Diagnosis not present

## 2021-09-04 DIAGNOSIS — J690 Pneumonitis due to inhalation of food and vomit: Secondary | ICD-10-CM | POA: Diagnosis not present

## 2021-09-04 DIAGNOSIS — T8571XD Infection and inflammatory reaction due to peritoneal dialysis catheter, subsequent encounter: Secondary | ICD-10-CM | POA: Diagnosis not present

## 2021-09-04 DIAGNOSIS — Z221 Carrier of other intestinal infectious diseases: Secondary | ICD-10-CM | POA: Diagnosis not present

## 2021-09-04 LAB — BASIC METABOLIC PANEL
Anion gap: 14 (ref 5–15)
BUN: 34 mg/dL — ABNORMAL HIGH (ref 6–20)
CO2: 20 mmol/L — ABNORMAL LOW (ref 22–32)
Calcium: 9 mg/dL (ref 8.9–10.3)
Chloride: 94 mmol/L — ABNORMAL LOW (ref 98–111)
Creatinine, Ser: 4.16 mg/dL — ABNORMAL HIGH (ref 0.44–1.00)
GFR, Estimated: 12 mL/min — ABNORMAL LOW (ref >60)
Glucose, Bld: 104 mg/dL — ABNORMAL HIGH (ref 70–99)
Potassium: 4.1 mmol/L (ref 3.5–5.1)
Sodium: 128 mmol/L — ABNORMAL LOW (ref 135–145)

## 2021-09-04 LAB — GLUCOSE, CAPILLARY
Glucose-Capillary: 116 mg/dL — ABNORMAL HIGH (ref 70–99)
Glucose-Capillary: 79 mg/dL (ref 70–99)
Glucose-Capillary: 82 mg/dL (ref 70–99)
Glucose-Capillary: 83 mg/dL (ref 70–99)
Glucose-Capillary: 87 mg/dL (ref 70–99)
Glucose-Capillary: 87 mg/dL (ref 70–99)

## 2021-09-04 LAB — CBC
HCT: 28.6 % — ABNORMAL LOW (ref 36.0–46.0)
Hemoglobin: 9.4 g/dL — ABNORMAL LOW (ref 12.0–15.0)
MCH: 28.6 pg (ref 26.0–34.0)
MCHC: 32.9 g/dL (ref 30.0–36.0)
MCV: 86.9 fL (ref 80.0–100.0)
Platelets: 117 10*3/uL — ABNORMAL LOW (ref 150–400)
RBC: 3.29 MIL/uL — ABNORMAL LOW (ref 3.87–5.11)
RDW: 24.7 % — ABNORMAL HIGH (ref 11.5–15.5)
WBC: 6.2 10*3/uL (ref 4.0–10.5)
nRBC: 0 % (ref 0.0–0.2)

## 2021-09-04 MED ORDER — DELFLEX-LC/1.5% DEXTROSE 344 MOSM/L IP SOLN: INTRAPERITONEAL | Status: DC

## 2021-09-04 MED ORDER — DELFLEX-LC/2.5% DEXTROSE 394 MOSM/L IP SOLN: INTRAPERITONEAL | Status: DC

## 2021-09-04 MED ORDER — VITAL 1.5 CAL PO LIQD
1000.0000 mL | ORAL | Status: DC
  Administered 2021-09-04 – 2021-09-07 (×3): 1000 mL

## 2021-09-04 MED ORDER — LOPERAMIDE HCL 1 MG/7.5ML PO SUSP
2.0000 mg | ORAL | Status: DC | PRN
  Filled 2021-09-04: qty 15

## 2021-09-04 NOTE — Progress Notes (Signed)
[facility] Progress Note   Subjective:   Seen in room, still w/ diarrhea/ rectal tube. Had "projectile vomiting" per the RN today. TF"s are on hold.   Objective Vitals:   09/04/21 1000 09/04/21 1100 09/04/21 1200 09/04/21 1226  BP: 104/72 121/73 112/76   Pulse: (!) 111 (!) 112 (!) 112   Resp: (!) 33 (!) 27 (!) 30   Temp:    97.7 F (36.5 C)  TempSrc:    Axillary  SpO2: 94% 95% 98%   Weight:      Height:         Additional Objective Labs: Basic Metabolic Panel: Recent Labs  Lab 09/01/21 1546 09/02/21 0534 09/03/21 0153 09/04/21 0210  NA  --  130* 128* 128*  K  --  3.3* 3.4* 4.1  CL  --  96* 95* 94*  CO2  --  20* 19* 20*  GLUCOSE  --  82 119* 104*  BUN  --  34* 32* 34*  CREATININE  --  4.13* 4.22* 4.16*  CALCIUM  --  8.7* 8.9 9.0  PHOS 4.6 4.2 3.7  --     CBC: Recent Labs  Lab 08/31/21 0311 09/01/21 0637 09/02/21 0536 09/03/21 0153 09/04/21 0210  WBC 4.5 5.9 5.0 5.0 6.2  HGB 10.2* 10.4* 9.7* 9.3* 9.4*  HCT 31.2* 31.6* 30.1* 28.4* 28.6*  MCV 84.8 85.9 85.8 86.1 86.9  PLT 160 136* 128* 132* 117*    Blood Culture    Component Value Date/Time   SDES BLOOD RIGHT HAND 08/29/2021 1233   SPECREQUEST  08/29/2021 1233    BOTTLES DRAWN AEROBIC AND ANAEROBIC Blood Culture adequate volume   CULT  08/29/2021 1233    NO GROWTH 5 DAYS Performed at [facility], [place] [address], [place], [place] [address]    REPTSTATUS 09/03/2021 FINAL 08/29/2021 1233     Physical Exam General: lethargic, lying in bed Heart: RRR No m,r,g Lungs: Clear bilaterally  Abdomen: soft, nontender today Rectal: rectal tube in place draining liquid brown stool Extremities: 1+ pitting hip edema L > R Dialysis Access: PD cath in place   Medications:  sodium chloride Stopped (08/31/21 1917)   dialysis solution 2.5% low-MG/low-CA Stopped (09/04/21 1108)   feeding supplement (VITAL 1.5 CAL) Stopped (09/04/21 1141)    benztropine  1 mg Per Tube QHS    Chlorhexidine Gluconate Cloth  6 each Topical Daily   feeding supplement (PROSource TF)  45 mL Per Tube BID   fiber  1 packet Per Tube BID   FLUoxetine  20 mg Per Tube Daily   gentamicin cream  1 application Topical Daily   heparin injection (subcutaneous)  5,000 Units Subcutaneous Q8H   levETIRAcetam  500 mg Per Tube BID   levothyroxine  50 mcg Per Tube Q breakfast   midodrine  10 mg Per Tube TID WC   multivitamin  1 tablet Per Tube QHS   nystatin  5 mL Per Tube QID   pantoprazole sodium  40 mg Per Tube Daily   risperiDONE  3 mg Per Tube QHS   sodium chloride flush  3 mL Intravenous Q12H   valproic acid  500 mg Per Tube BID   vancomycin variable dose per unstable renal function (pharmacist dosing)   Does not apply See admin instructions    Dialysis Orders:  [facility] [phone] ([facility] not open on weekends)  OP Nephrologist: Dimas Chyle  Ambulatory PD  7x/week  4 exchanges 2L  Assessment/Plan: Sepsis - due to asp PNA and PD cath related peritonitis Aspiration PNA - +bld cx thought to be contaminant.   ID signed off. IV unasyn for asp PNA. MRSE PD cath related acute peritonitis - f/u cell count showed sig improvement on 11/8. No e/o tunnel infection. Cont Vancomycin (IV here, IP when dc'd) for 2 wks total thru 11/18, per ID.  Hypotension/volume - had pressor support on admission, is now getting midodrine 10 tid. BPs are at baseline now.  ESRD - will continue CCPD nightly, use all 2.5% fluids tonight w/ hip edema Anemia  - Hgb 8s > 9s> 10s > 9s.  Consider esa if falls lower.  Metabolic bone disease -  Corr Ca/Phos ok.  Nutrition - Alb low.  Prot supp when taking PO Schizoaffective/Bipolar disorder - per primary  Hypokalemia - requiring ongoing repletion PRN.    08/31/21 - Dr Johnney Ou spoke to RN at her dialysis clinic - has 2 family members (sister and BIL) who do her PD treatments for her and have been providing essentially total care to her recently.  Her BP  chronically runs on the low side.  Her current clinical status doesn't seem far off her baseline actually, so it's reasonable to continue her PD.  Spoke to [employer]  her sister who agrees she and husband can continue the current level of care for Katasha and would prefer not to switch to HD.  Ongoing discussion as course progresses.    Kelly Splinter, MD 09/04/2021, 3:12 PM

## 2021-09-04 NOTE — Progress Notes (Signed)
PROGRESS NOTE    [name]  NWG:[id number] DOB: 1966-05-14 DOA: 08/26/2021 PCP: [name], MD   Brief Narrative/Hospital Course:  Jane [name], 55 y.o. female with past medical history of end-stage renal disease on peritoneal dialysis, schizoaffective disorder, history of osteomyelitis epidural abscess presented to hospital with nausea vomiting diarrhea abdominal pain and cough.  At baseline patient is wheelchair-bound and has been dealing with disabling psych issues.  In the ED patient had hypotension acute encephalopathy and was admitted hospital for septic shock aspiration pneumonia acute metabolic encephalopathy initially to the ICU.  Patient was seen by nephrology and ID.  CT scan of the abdomen pelvis showed pneumonia and small ascites.  Patient was treated for spontaneous bacterial peritonitis secondary to MRSE from PD catheter and aspiration pneumonia.  Patient has been on NG tube feeding since that she has not been taking orally.  Patient also has rectal tube in place.    Assessment & Plan:  Septic shock due to spontaneous bacterial peritonitis secondary to peritoneal dialysis catheter associated MRSA./Aspiration pneumonia, Patient had the peritoneal fluid analysis.  ID on board.  Plan is to continue 2 weeks of vancomycin.  Patient had been on Unasyn.  Nephrology also started nystatin p.o. 4 times daily for fungal peritonitis prophylaxis.  On midodrine as well.   Vomiting but diarrhea.  As per the nursing staff.  Recent abdominal x-ray showing ileus.  We will repeat abdominal x-ray/KUB for reassessment.  Hold tube feeding.  Reglan was started on 11/10.    ESRD on PD:  Patient was seen by nephrology during hospitalization for dialysis.  Hypokalemia  Improved after replacement.  Potassium 4.1 today.  Hyponatremia: Mild hyponatremia.  At 128.  We will continue to monitor.  Hypoglycemia:  Continue to monitor closely.  On tube feeds.   Sinus  tachycardia/borderline low blood pressure. Continue midodrine.  Secondary to ongoing diarrhea and peritoneal dialysis.  Midodrine dose was increased from 5-10.  Diarrhea with C. difficile antigen positive toxin negative, indeterminate results.  Rectal tube in place.  ID did not recommend treatment for C. difficile.  Due to vomiting we will hold with Imodium for now.  If there is no worsening ileus will resume   MRSE and GPR positive in 1/2 sets felt to be contaminants with 2 different organism from single set.  Acute metabolic encephalopathy: Patient lethargic-not much communicative in the setting of multiple cognitive and psychiatric comorbidities, acute illness.  If continues to do poorly will consult palliative care.  Overall does not have good prognosis  Schizoaffective disorder/bipolar disorder/Multiple cognitive and psychiatric comorbidities: Patient is on Cogentin, fluoxetine, Risperdal, Keppra at home. Cont supportive care  Hypothyroidism: Continue Synthroid   History of discitis/epidural abscess  Status postlaminectomy on 01/25/2021 with MSSA and completed 8 weeks of antibiotic.  Patient is wheelchair bound at this time.  Needing total assist and care.   Anemia- Likely multifactorial from chronic kidney disease.  Patient was FOBT positive on 11/ 5 but hemoglobin has been stable since then.  Was FOBT positive 11/5 although no evidence of active GI bleeding.    Malnutrition of moderate degree:  Patient has been seen by nutritionist.  Was started on dysphagia diet as well as tube feeding.  Hold tube feeding for now due to vomiting.   Pressure injury of right sacrum stage II POA  continue local wound care Pressure Injury 01/25/21 Sacrum Right;Left Stage 2 -  Partial thickness loss of dermis presenting as a shallow open injury with a red,  pink wound bed without slough. (Active)  01/25/21 1951  Location: Sacrum  Location Orientation: Right;Left  Staging: Stage 2 -  Partial thickness  loss of dermis presenting as a shallow open injury with a red, pink wound bed without slough. (old pressure injury that is healing)  Wound Description (Comments):   Present on Admission: Yes   DVT prophylaxis: heparin injection 5,000 Units Start: 08/30/21 1400    Code Status: Full Code  Family Communication:  None at bedside  Status is: Inpatient  Remains inpatient appropriate because: Ongoing management of sepsis, poor oral intake, ileus, tube feeding,   Disposition. Likely home with family when medically stable.  Currently not medically stable  Objective: Vitals last 24 hrs: Vitals:   09/04/21 0736 09/04/21 0800 09/04/21 0900 09/04/21 1000  BP:  109/75 113/72 104/72  Pulse:  (!) 117 (!) 117 (!) 111  Resp:  (!) 27 (!) 34 (!) 33  Temp: 98.6 F (37 C)     TempSrc: Axillary     SpO2:  95% 97% 94%  Weight:      Height:       Weight change: 1.1 kg  Intake/Output Summary (Last 24 hours) at 09/04/2021 1149 Last data filed at 09/04/2021 1000 Gross per 24 hour  Intake 9295 ml  Output 9157 ml  Net 138 ml    Net IO Since Admission: 3,288.11 mL [09/04/21 1149]    Subjective: Today, patient was seen and examined at bedside.  Nursing staff reported that she did have a projectile vomiting.  Was sleepy at the time of my exam.  Diarrhea on the rectal tube.  Physical Examination: General: Drowsy, responds to pain, frail appearing, older than stated age HENT:   No scleral pallor or icterus noted. Oral mucosa is moist.  Chest:  Clear breath sounds.  Diminished breath sounds bilaterally. No crackles or wheezes.  CVS: S1 &S2 heard. No murmur.  Regular rate and rhythm. Abdomen: Soft, tender dialysis catheter in place, nondistended, bowel sounds present.  Rectal tube in place. Extremities: No cyanosis, clubbing or edema.  Peripheral pulses are palpable.  Moving extremities. Psych: Somnolent, responds to pain, normal mood CNS: Moves all extremities. Skin: Warm and dry.  No rashes  noted.   Medications reviewed:  Scheduled Meds:  benztropine  1 mg Per Tube QHS   Chlorhexidine Gluconate Cloth  6 each Topical Daily   feeding supplement (PROSource TF)  45 mL Per Tube BID   fiber  1 packet Per Tube BID   FLUoxetine  20 mg Per Tube Daily   gentamicin cream  1 application Topical Daily   heparin injection (subcutaneous)  5,000 Units Subcutaneous Q8H   levETIRAcetam  500 mg Per Tube BID   levothyroxine  50 mcg Per Tube Q breakfast   metoCLOPramide (REGLAN) injection  5 mg Intravenous Q8H   midodrine  10 mg Per Tube TID WC   multivitamin  1 tablet Per Tube QHS   nystatin  5 mL Per Tube QID   pantoprazole sodium  40 mg Per Tube Daily   risperiDONE  3 mg Per Tube QHS   sodium chloride flush  3 mL Intravenous Q12H   valproic acid  500 mg Per Tube BID   vancomycin variable dose per unstable renal function (pharmacist dosing)   Does not apply See admin instructions   Continuous Infusions:  sodium chloride Stopped (08/31/21 1917)   dialysis solution 2.5% low-MG/low-CA Stopped (09/04/21 1108)   feeding supplement (VITAL 1.5 CAL) Stopped (09/04/21 1141)  Diet Order             DIET DYS 2 Room service appropriate? Yes; Fluid consistency: Thin  Diet effective now                   Nutrition Problem: Moderate Malnutrition Etiology: chronic illness (ESRD, CHF) Signs/Symptoms: mild fat depletion, moderate muscle depletion Interventions: Tube feeding, Prostat, MVI  Weight change: 1.1 kg  Wt Readings from Last 3 Encounters:  09/04/21 64.1 kg  06/20/21 58.9 kg  02/04/21 58.9 kg     Consultants:see note  Procedures:see note Antimicrobials: Anti-infectives (From admission, onward)    Start     Dose/Rate Route Frequency Ordered Stop   09/03/21 1500  vancomycin (VANCOREADY) IVPB 1000 mg/200 mL        1,000 mg 200 mL/hr over 60 Minutes Intravenous  Once 09/02/21 1534 09/04/21 0816   09/02/21 2008  vancomycin variable dose per unstable renal function (pharmacist  dosing)         Does not apply See admin instructions 09/02/21 2008     08/30/21 1100  vancomycin (VANCOREADY) IVPB 1000 mg/200 mL  Status:  Discontinued        1,000 mg 200 mL/hr over 60 Minutes Intravenous every 72 hours 08/30/21 0928 09/02/21 1534   08/29/21 2200  Ampicillin-Sulbactam (UNASYN) 3 g in sodium chloride 0.9 % 100 mL IVPB        3 g 200 mL/hr over 30 Minutes Intravenous Every 24 hours 08/29/21 1239 09/02/21 2255   08/28/21 2200  ceFEPIme (MAXIPIME) 2 g in sodium chloride 0.9 % 100 mL IVPB  Status:  Discontinued        2 g 200 mL/hr over 30 Minutes Intravenous Every 48 hours 08/27/21 0348 08/29/21 1239   08/28/21 0051  vancomycin variable dose per unstable renal function (pharmacist dosing)  Status:  Discontinued         Does not apply See admin instructions 08/28/21 0052 08/30/21 0936   08/27/21 0330  metroNIDAZOLE (FLAGYL) IVPB 500 mg  Status:  Discontinued        500 mg 100 mL/hr over 60 Minutes Intravenous Every 12 hours 08/27/21 0328 08/29/21 1239   08/27/21 0315  vancomycin (VANCOREADY) IVPB 1500 mg/300 mL        1,500 mg 150 mL/hr over 120 Minutes Intravenous  Once 08/27/21 0303 08/27/21 0654   08/27/21 0215  ceFEPIme (MAXIPIME) 2 g in sodium chloride 0.9 % 100 mL IVPB        2 g 200 mL/hr over 30 Minutes Intravenous  Once 08/27/21 0202 08/27/21 0327      Culture/Microbiology    Component Value Date/Time   SDES BLOOD RIGHT HAND 08/29/2021 1233   SPECREQUEST  08/29/2021 1233    BOTTLES DRAWN AEROBIC AND ANAEROBIC Blood Culture adequate volume   CULT  08/29/2021 1233    NO GROWTH 5 DAYS Performed at [facility], [place] [address], [place], [place] [address]    REPTSTATUS 09/03/2021 FINAL 08/29/2021 1233      Data Reviewed: I have personally reviewed the following labs and imaging studies.    CBC: Recent Labs  Lab 08/31/21 0311 09/01/21 0637 09/02/21 0536 09/03/21 0153 09/04/21 0210  WBC 4.5 5.9 5.0 5.0 6.2  HGB 10.2* 10.4* 9.7* 9.3* 9.4*  HCT  31.2* 31.6* 30.1* 28.4* 28.6*  MCV 84.8 85.9 85.8 86.1 86.9  PLT 160 136* 128* 132* 117*    Basic Metabolic Panel: Recent Labs  Lab 08/31/21 0311 08/31/21  1127 08/31/21 1813 09/01/21 0637 09/01/21 1546 09/02/21 0534 09/03/21 0153 09/04/21 0210  NA 127*  --   --  128*  --  130* 128* 128*  K 3.3*  --   --  4.3  --  3.3* 3.4* 4.1  CL 93*  --   --  97*  --  96* 95* 94*  CO2 18*  --   --  19*  --  20* 19* 20*  GLUCOSE 98  --   --  61*  --  82 119* 104*  BUN 36*  --   --  35*  --  34* 32* 34*  CREATININE 4.20*  --   --  4.13*  --  4.13* 4.22* 4.16*  CALCIUM 8.8*  --   --  8.8*  --  8.7* 8.9 9.0  MG 2.2 2.1 2.1 2.0 2.0  --  1.7  --   PHOS 3.8 4.2 4.1 4.3 4.6 4.2 3.7  --     GFR: Estimated Creatinine Clearance: 13.3 mL/min (A) (by C-G formula based on SCr of 4.16 mg/dL (H)). Liver Function Tests: Recent Labs  Lab 09/02/21 0534 09/03/21 0153  ALBUMIN <1.5* <1.5*    No results for input(s): LIPASE, AMYLASE in the last 168 hours.  No results for input(s): AMMONIA in the last 168 hours. Coagulation Profile: No results for input(s): INR, PROTIME in the last 168 hours. Cardiac Enzymes: No results for input(s): CKTOTAL, CKMB, CKMBINDEX, TROPONINI in the last 168 hours. BNP (last 3 results) No results for input(s): PROBNP in the last 8760 hours. HbA1C: No results for input(s): HGBA1C in the last 72 hours. CBG: Recent Labs  Lab 09/03/21 1514 09/03/21 1951 09/03/21 2339 09/04/21 0359 09/04/21 0736  GLUCAP 90 94 109* 116* 87    Lipid Profile: No results for input(s): CHOL, HDL, LDLCALC, TRIG, CHOLHDL, LDLDIRECT in the last 72 hours. Thyroid Function Tests: No results for input(s): TSH, T4TOTAL, FREET4, T3FREE, THYROIDAB in the last 72 hours. Anemia Panel: Recent Labs    09/02/21 1210  VITAMINB12 2,579*  FOLATE 10.2  FERRITIN 1,486*  TIBC <70*  IRON 49  RETICCTPCT 1.1    Sepsis Labs: Recent Labs  Lab 08/29/21 0245  PROCALCITON 1.34     Recent Results  (from the past 240 hour(s))  Resp Panel by RT-PCR (Flu A&B, Covid) Nasopharyngeal Swab     Status: None   Collection Time: 08/26/21 10:54 PM   Specimen: Nasopharyngeal Swab; Nasopharyngeal(NP) swabs in vial transport medium  Result Value Ref Range Status   SARS Coronavirus 2 by RT PCR NEGATIVE NEGATIVE Final    Comment: (NOTE) SARS-CoV-2 target nucleic acids are NOT DETECTED.  The SARS-CoV-2 RNA is generally detectable in upper respiratory specimens during the acute phase of infection. The lowest concentration of SARS-CoV-2 viral copies this assay can detect is 138 copies/mL. A negative result does not preclude SARS-Cov-2 infection and should not be used as the sole basis for treatment or other patient management decisions. A negative result may occur with  improper specimen collection/handling, submission of specimen other than nasopharyngeal swab, presence of viral mutation(s) within the areas targeted by this assay, and inadequate number of viral copies(<138 copies/mL). A negative result must be combined with clinical observations, patient history, and epidemiological information. The expected result is Negative.  Fact Sheet for Patients:  EntrepreneurPulse.com.au  Fact Sheet for Healthcare Providers:  IncredibleEmployment.be  This test is no t yet approved or cleared by the [place] and  has been authorized for  detection and/or diagnosis of SARS-CoV-2 by FDA under an Emergency Use Authorization (EUA). This EUA will remain  in effect (meaning this test can be used) for the duration of the COVID-19 declaration under Section 564(b)(1) of the Act, 21 U.S.C.section 360bbb-3(b)(1), unless the authorization is terminated  or revoked sooner.       Influenza A by PCR NEGATIVE NEGATIVE Final   Influenza B by PCR NEGATIVE NEGATIVE Final    Comment: (NOTE) The Xpert Xpress SARS-CoV-2/FLU/RSV plus assay is intended as an aid in the  diagnosis of influenza from Nasopharyngeal swab specimens and should not be used as a sole basis for treatment. Nasal washings and aspirates are unacceptable for Xpert Xpress SARS-CoV-2/FLU/RSV testing.  Fact Sheet for Patients: EntrepreneurPulse.com.au  Fact Sheet for Healthcare Providers: IncredibleEmployment.be  This test is not yet approved or cleared by the [place] FDA and has been authorized for detection and/or diagnosis of SARS-CoV-2 by FDA under an Emergency Use Authorization (EUA). This EUA will remain in effect (meaning this test can be used) for the duration of the COVID-19 declaration under Section 564(b)(1) of the Act, 21 U.S.C. section 360bbb-3(b)(1), unless the authorization is terminated or revoked.  Performed at [facility], [place] [address] [place], [place] [address]   Blood culture (routine x 2)     Status: Abnormal   Collection Time: 08/27/21  2:00 AM   Specimen: BLOOD  Result Value Ref Range Status   Specimen Description BLOOD RIGHT ARM  Final   Special Requests   Final    BOTTLES DRAWN AEROBIC AND ANAEROBIC Blood Culture results may not be optimal due to an excessive volume of blood received in culture bottles   Culture  Setup Time   Final    GRAM POSITIVE COCCI AEROBIC BOTTLE ONLY CRITICAL RESULT CALLED TO, READ BACK BY AND VERIFIED WITH: PHARMD GREG ABBOTT 08/28/21@00 :50 BY TW IN BOTH AEROBIC AND ANAEROBIC BOTTLES    Culture (A)  Final    STAPHYLOCOCCUS EPIDERMIDIS DIPHTHEROIDS(CORYNEBACTERIUM SPECIES) Standardized susceptibility testing for this organism is not available. Performed at [facility], [place] [address], [place], [place] [address]    Report Status 08/31/2021 FINAL  Final   Organism ID, Bacteria STAPHYLOCOCCUS EPIDERMIDIS  Final      Susceptibility   Staphylococcus epidermidis - MIC*    CIPROFLOXACIN 1 SENSITIVE Sensitive     ERYTHROMYCIN <=0.25 SENSITIVE Sensitive     GENTAMICIN 8  INTERMEDIATE Intermediate     OXACILLIN >=4 RESISTANT Resistant     TETRACYCLINE 2 SENSITIVE Sensitive     VANCOMYCIN 2 SENSITIVE Sensitive     TRIMETH/SULFA 160 RESISTANT Resistant     CLINDAMYCIN <=0.25 SENSITIVE Sensitive     RIFAMPIN <=0.5 SENSITIVE Sensitive     Inducible Clindamycin NEGATIVE Sensitive     * STAPHYLOCOCCUS EPIDERMIDIS  Blood Culture ID Panel (Reflexed)     Status: Abnormal   Collection Time: 08/27/21  2:00 AM  Result Value Ref Range Status   Enterococcus faecalis NOT DETECTED NOT DETECTED Final   Enterococcus Faecium NOT DETECTED NOT DETECTED Final   Listeria monocytogenes NOT DETECTED NOT DETECTED Final   Staphylococcus species DETECTED (A) NOT DETECTED Final    Comment: CRITICAL RESULT CALLED TO, READ BACK BY AND VERIFIED WITH: PHARMD GREG ABBOTT 08/28/21@00 :50 BY TW    Staphylococcus aureus (BCID) NOT DETECTED NOT DETECTED Final   Staphylococcus epidermidis DETECTED (A) NOT DETECTED Final    Comment: Methicillin (oxacillin) resistant coagulase negative staphylococcus. Possible blood culture contaminant (unless isolated  from more than one blood culture draw or clinical case suggests pathogenicity). No antibiotic treatment is indicated for blood  culture contaminants. CRITICAL RESULT CALLED TO, READ BACK BY AND VERIFIED WITH: PHARMD GREG ABBOTT 08/28/21@00 :50 BY TW    Staphylococcus lugdunensis NOT DETECTED NOT DETECTED Final   Streptococcus species NOT DETECTED NOT DETECTED Final   Streptococcus agalactiae NOT DETECTED NOT DETECTED Final   Streptococcus pneumoniae NOT DETECTED NOT DETECTED Final   Streptococcus pyogenes NOT DETECTED NOT DETECTED Final   A.calcoaceticus-baumannii NOT DETECTED NOT DETECTED Final   Bacteroides fragilis NOT DETECTED NOT DETECTED Final   Enterobacterales NOT DETECTED NOT DETECTED Final   Enterobacter cloacae complex NOT DETECTED NOT DETECTED Final   Escherichia coli NOT DETECTED NOT DETECTED Final   Klebsiella aerogenes NOT  DETECTED NOT DETECTED Final   Klebsiella oxytoca NOT DETECTED NOT DETECTED Final   Klebsiella pneumoniae NOT DETECTED NOT DETECTED Final   Proteus species NOT DETECTED NOT DETECTED Final   Salmonella species NOT DETECTED NOT DETECTED Final   Serratia marcescens NOT DETECTED NOT DETECTED Final   Haemophilus influenzae NOT DETECTED NOT DETECTED Final   Neisseria meningitidis NOT DETECTED NOT DETECTED Final   Pseudomonas aeruginosa NOT DETECTED NOT DETECTED Final   Stenotrophomonas maltophilia NOT DETECTED NOT DETECTED Final   Candida albicans NOT DETECTED NOT DETECTED Final   Candida auris NOT DETECTED NOT DETECTED Final   Candida glabrata NOT DETECTED NOT DETECTED Final   Candida krusei NOT DETECTED NOT DETECTED Final   Candida parapsilosis NOT DETECTED NOT DETECTED Final   Candida tropicalis NOT DETECTED NOT DETECTED Final   Cryptococcus neoformans/gattii NOT DETECTED NOT DETECTED Final   Methicillin resistance mecA/C DETECTED (A) NOT DETECTED Final    Comment: CRITICAL RESULT CALLED TO, READ BACK BY AND VERIFIED WITH: PHARMD GREG ABBOTT 08/28/21@00 :50 BY TW Performed at [facility], [address], [place], [place] [address]   MRSA Next Gen by PCR, Nasal     Status: None   Collection Time: 08/27/21  3:29 AM   Specimen: Nasal Mucosa; Nasal Swab  Result Value Ref Range Status   MRSA by PCR Next Gen NOT DETECTED NOT DETECTED Final    Comment: (NOTE) The GeneXpert MRSA Assay (FDA approved for NASAL specimens only), is one component of a comprehensive MRSA colonization surveillance program. It is not intended to diagnose MRSA infection nor to guide or monitor treatment for MRSA infections. Test performance is not FDA approved in patients less than 95 years old. Performed at [facility], [place] [address], [place], [place] [address]   Blood culture (routine x 2)     Status: None   Collection Time: 08/27/21  7:57 AM   Specimen: BLOOD RIGHT FOREARM  Result Value Ref Range  Status   Specimen Description BLOOD RIGHT FOREARM  Final   Special Requests   Final    BOTTLES DRAWN AEROBIC AND ANAEROBIC Blood Culture adequate volume   Culture   Final    NO GROWTH 5 DAYS Performed at [facility], [place] [address], [place], [place] [address]    Report Status 09/01/2021 FINAL  Final  Body fluid culture w Gram Stain     Status: None   Collection Time: 08/27/21  2:16 PM   Specimen: Peritoneal Washings; Body Fluid  Result Value Ref Range Status   Specimen Description PERITONEAL FLUID  Final   Special Requests NONE  Final   Gram Stain   Final    WBC PRESENT,BOTH PMN AND  MONONUCLEAR GRAM POSITIVE COCCI CYTOSPIN SMEAR Performed at [facility], [place] [address], [place], [place] [address]    Culture ABUNDANT STAPHYLOCOCCUS EPIDERMIDIS  Final   Report Status 08/30/2021 FINAL  Final   Organism ID, Bacteria STAPHYLOCOCCUS EPIDERMIDIS  Final      Susceptibility   Staphylococcus epidermidis - MIC*    CIPROFLOXACIN 2 INTERMEDIATE Intermediate     ERYTHROMYCIN <=0.25 SENSITIVE Sensitive     GENTAMICIN 4 SENSITIVE Sensitive     OXACILLIN >=4 RESISTANT Resistant     TETRACYCLINE <=1 SENSITIVE Sensitive     VANCOMYCIN 1 SENSITIVE Sensitive     TRIMETH/SULFA 20 SENSITIVE Sensitive     CLINDAMYCIN <=0.25 SENSITIVE Sensitive     RIFAMPIN <=0.5 SENSITIVE Sensitive     Inducible Clindamycin NEGATIVE Sensitive     * ABUNDANT STAPHYLOCOCCUS EPIDERMIDIS  C Difficile Quick Screen w PCR reflex     Status: Abnormal   Collection Time: 08/28/21 10:25 AM   Specimen: STOOL  Result Value Ref Range Status   C Diff antigen POSITIVE (A) NEGATIVE Final    Comment: RESULTS REPORTED TO RN BEABRAUT 08/28/21 AT 1120 BY NM   C Diff toxin NEGATIVE NEGATIVE Final   C Diff interpretation Results are indeterminate. See PCR results.  Final    Comment: Performed at [facility], [place] [address], [place], [place] [address]  C. Diff by PCR, Reflexed     Status: Abnormal   Collection  Time: 08/28/21 10:25 AM  Result Value Ref Range Status   Toxigenic C. Difficile by PCR POSITIVE (A) NEGATIVE Final    Comment: Positive for toxigenic C. difficile with little to no toxin production. Only treat if clinical presentation suggests symptomatic illness. Performed at [facility], [place] [address], [place], [place] [address]   Culture, blood (routine x 2)     Status: None   Collection Time: 08/29/21 12:24 PM   Specimen: BLOOD LEFT HAND  Result Value Ref Range Status   Specimen Description BLOOD LEFT HAND  Final   Special Requests   Final    BOTTLES DRAWN AEROBIC AND ANAEROBIC Blood Culture adequate volume   Culture   Final    NO GROWTH 5 DAYS Performed at [facility], [place] [address], [place], [place] [address]    Report Status 09/03/2021 FINAL  Final  Culture, blood (routine x 2)     Status: None   Collection Time: 08/29/21 12:33 PM   Specimen: BLOOD RIGHT HAND  Result Value Ref Range Status   Specimen Description BLOOD RIGHT HAND  Final   Special Requests   Final    BOTTLES DRAWN AEROBIC AND ANAEROBIC Blood Culture adequate volume   Culture   Final    NO GROWTH 5 DAYS Performed at [facility], [place] [address] [place], [place] [address]    Report Status 09/03/2021 FINAL  Final     Radiology Studies: No results found.   LOS: 8 days   Flora Lipps, MD [facility] 09/04/2021, 11:49 AM

## 2021-09-05 ENCOUNTER — Inpatient Hospital Stay: Payer: Self-pay

## 2021-09-05 DIAGNOSIS — T8571XD Infection and inflammatory reaction due to peritoneal dialysis catheter, subsequent encounter: Secondary | ICD-10-CM | POA: Diagnosis not present

## 2021-09-05 DIAGNOSIS — J690 Pneumonitis due to inhalation of food and vomit: Secondary | ICD-10-CM | POA: Diagnosis not present

## 2021-09-05 DIAGNOSIS — Z221 Carrier of other intestinal infectious diseases: Secondary | ICD-10-CM | POA: Diagnosis not present

## 2021-09-05 DIAGNOSIS — A419 Sepsis, unspecified organism: Secondary | ICD-10-CM | POA: Diagnosis not present

## 2021-09-05 LAB — COMPREHENSIVE METABOLIC PANEL
ALT: 9 U/L (ref 0–44)
AST: 8 U/L — ABNORMAL LOW (ref 15–41)
Albumin: <1.5 g/dL — ABNORMAL LOW (ref 3.5–5.0)
Alkaline Phosphatase: 153 U/L — ABNORMAL HIGH (ref 38–126)
Anion gap: 13 (ref 5–15)
BUN: 38 mg/dL — ABNORMAL HIGH (ref 6–20)
CO2: 21 mmol/L — ABNORMAL LOW (ref 22–32)
Calcium: 9.4 mg/dL (ref 8.9–10.3)
Chloride: 95 mmol/L — ABNORMAL LOW (ref 98–111)
Creatinine, Ser: 4.21 mg/dL — ABNORMAL HIGH (ref 0.44–1.00)
GFR, Estimated: 12 mL/min — ABNORMAL LOW (ref >60)
Glucose, Bld: 93 mg/dL (ref 70–99)
Potassium: 4.5 mmol/L (ref 3.5–5.1)
Sodium: 129 mmol/L — ABNORMAL LOW (ref 135–145)
Total Bilirubin: 0.2 mg/dL — ABNORMAL LOW (ref 0.3–1.2)
Total Protein: 5 g/dL — ABNORMAL LOW (ref 6.5–8.1)

## 2021-09-05 LAB — CBC
HCT: 27.9 % — ABNORMAL LOW (ref 36.0–46.0)
Hemoglobin: 9.1 g/dL — ABNORMAL LOW (ref 12.0–15.0)
MCH: 28.3 pg (ref 26.0–34.0)
MCHC: 32.6 g/dL (ref 30.0–36.0)
MCV: 86.9 fL (ref 80.0–100.0)
Platelets: 120 10*3/uL — ABNORMAL LOW (ref 150–400)
RBC: 3.21 MIL/uL — ABNORMAL LOW (ref 3.87–5.11)
RDW: 24.4 % — ABNORMAL HIGH (ref 11.5–15.5)
WBC: 6.6 10*3/uL (ref 4.0–10.5)
nRBC: 0 % (ref 0.0–0.2)

## 2021-09-05 LAB — PHOSPHORUS: Phosphorus: 3.7 mg/dL (ref 2.5–4.6)

## 2021-09-05 LAB — GLUCOSE, CAPILLARY
Glucose-Capillary: 92 mg/dL (ref 70–99)
Glucose-Capillary: 76 mg/dL (ref 70–99)
Glucose-Capillary: 92 mg/dL (ref 70–99)
Glucose-Capillary: 78 mg/dL (ref 70–99)
Glucose-Capillary: 80 mg/dL (ref 70–99)
Glucose-Capillary: 94 mg/dL (ref 70–99)

## 2021-09-05 LAB — MAGNESIUM: Magnesium: 1.7 mg/dL (ref 1.7–2.4)

## 2021-09-05 MED ORDER — SODIUM CHLORIDE 0.9% FLUSH
10.0000 mL | Freq: Two times a day (BID) | INTRAVENOUS | Status: DC
  Administered 2021-09-05 – 2021-09-11 (×7): 10 mL

## 2021-09-05 MED ORDER — DELFLEX-LC/1.5% DEXTROSE 344 MOSM/L IP SOLN
INTRAPERITONEAL | Status: DC
  Administered 2021-09-05: 4000 mL via INTRAPERITONEAL

## 2021-09-05 MED ORDER — GERHARDT'S BUTT CREAM
TOPICAL_CREAM | Freq: Every day | CUTANEOUS | Status: DC | PRN
  Filled 2021-09-05: qty 1

## 2021-09-05 MED ORDER — DELFLEX-LC/2.5% DEXTROSE 394 MOSM/L IP SOLN
INTRAPERITONEAL | Status: DC
  Administered 2021-09-05: 4000 mL via INTRAPERITONEAL

## 2021-09-05 MED ORDER — SODIUM CHLORIDE 0.9% FLUSH: 10.0000 mL | INTRAVENOUS | Status: DC | PRN

## 2021-09-05 NOTE — Progress Notes (Addendum)
PROGRESS NOTE    Jane [name]  NID:[id number] DOB: April 06, 1966 DOA: 08/26/2021 PCP: [name], MD   Brief Narrative/Hospital Course:  [name], 55 y.o. female with past medical history of end-stage renal disease on peritoneal dialysis, schizoaffective disorder, history of osteomyelitis, epidural abscess presented to hospital with nausea, vomiting, diarrhea, abdominal pain and cough.  At baseline, patient is wheelchair-bound and has been dealing with disabling psych issues.  In the ED, patient had hypotension, acute encephalopathy and was admitted hospital for septic shock, aspiration pneumonia, acute metabolic encephalopathy initially to the ICU.  Patient was seen by nephrology and ID.  CT scan of the abdomen pelvis showed pneumonia and small ascites.  Patient was treated for spontaneous bacterial peritonitis secondary to MRSE from PD catheter and aspiration pneumonia.  Patient has been on NG tube feeding since that she has not been taking orally.  Patient also has rectal tube in place.  Assessment & Plan:  Septic shock due to spontaneous bacterial peritonitis secondary to peritoneal dialysis catheter associated infection./Aspiration pneumonia,  ID on board.  Plan is to continue 2 weeks of IV vancomycin.  Nephrology also started nystatin p.o. 4 times daily for fungal peritonitis prophylaxis.  On midodrine as well.   Projectile Vomiting and diarrhea.  Yesterday.  Poor PEG tube tolerance.  Dietitian on board and recommend PICC line placement with TPN.  Abdominal x-ray shows improving ileus.  Will hold Reglan that was started on 11/10 to excessive diarrhea.  Hold off with Imodium..    ESRD on PD:  Patient was seen by nephrology during hospitalization for dialysis.  Spoke with nephrology for possible PICC line and TPN placement.  Proceed with PICC line if okay with nephrology  Hypokalemia  Improved after replacement.  Potassium 4.5  Hyponatremia: Mild hyponatremia.  At this  time sodium level of 129  Hypoglycemia:  Latest POC glucose of 76.  We will continue to monitor.   Sinus tachycardia/borderline low blood pressure. Continue midodrine.  Secondary to ongoing diarrhea and peritoneal dialysis.  Midodrine dose was increased from 5-10mg .  Diarrhea with C. difficile antigen positive toxin negative, indeterminate results.  Rectal tube in place.  ID did not recommend treatment for C. difficile.    MRSE and GPR positive in 1/2 sets felt to be contaminants with 2 different organism from single set.  Acute metabolic encephalopathy: Patient lethargic-not much communicative in the setting of multiple cognitive and psychiatric comorbidities, acute illness.    Overall does not have good prognosis  Schizoaffective disorder/bipolar disorder/Multiple cognitive and psychiatric comorbidities: Patient is on Cogentin, fluoxetine, Risperdal, Keppra at home. Cont supportive care  Hypothyroidism: Continue Synthroid   History of discitis/epidural abscess  Status postlaminectomy on 01/25/2021 with MSSA and completed 8 weeks of antibiotic.  Patient is wheelchair bound at this time.  Needing total assist and care.   Anemia- Likely multifactorial from chronic kidney disease.  Patient was FOBT positive on 11/ 5 but hemoglobin has been stable since then.  Was FOBT positive 11/5 although no evidence of active GI bleeding.  Latest hemoglobin of 9.1 we will continue to monitor.  Moderate protein calorie malnutrition Patient has been seen by nutritionist.  Was started on dysphagia diet as well as tube feeding.  Able to tolerate p.o. and has inadequate oral intake.  Dietitian recommended TPN.  Pressure injury of right sacrum stage II POA  continue local wound care Pressure Injury 01/25/21 Sacrum Right;Left Stage 2 -  Partial thickness loss of dermis presenting as a shallow open  injury with a red, pink wound bed without slough. (Active)  01/25/21 1951  Location: Sacrum  Location  Orientation: Right;Left  Staging: Stage 2 -  Partial thickness loss of dermis presenting as a shallow open injury with a red, pink wound bed without slough. (old pressure injury that is healing)  Wound Description (Comments):   Present on Admission: Yes   DVT prophylaxis: heparin injection 5,000 Units Start: 08/30/21 1400    Code Status: Full Code  Family Communication:  Spoke with the patient's sister on the phone and updated her about the clinical condition of the patient and possible need for PICC line and TPN.  She is agreeable to it.  Status is: Inpatient  Remains inpatient appropriate because: Ongoing management of sepsis, poor oral intake, ileus, tube feeding, possible need for TPN.  Disposition. Likely home with family when medically stable.  Currently not medically stable   Subjective: Today, patient was seen and examined at bedside.  Dietitian reported that she did have poor oral intake and is nutritionally inadequate.  Recommending PICC line.  Patient is easily tearful.  Continues to have  diarrhea.  Objective:  Vitals:   09/05/21 0700 09/05/21 0800 09/05/21 0900 09/05/21 1000  BP: 108/69 113/74 115/74   Pulse: (!) 108 (!) 109 (!) 112 (!) 119  Resp: (!) 26 (!) 24 (!) 27 (!) 27  Temp:  98.8 F (37.1 C)    TempSrc:  Oral    SpO2: 97% 98% 97% 94%  Weight:      Height:       Weight change: 0 kg  Intake/Output Summary (Last 24 hours) at 09/05/2021 1052 Last data filed at 09/05/2021 1000 Gross per 24 hour  Intake 8596.75 ml  Output 9064 ml  Net -467.25 ml    Net IO Since Admission: 2,523.86 mL [09/05/21 1052]    Physical Examination:  General: Alert awake, feels tearful,, frail appearing, older than stated age HENT:   No scleral pallor or icterus noted. Oral mucosa is moist.  Cortrak tube tube in place. Chest:  Clear breath sounds.  Diminished breath sounds bilaterally. No crackles or wheezes.  CVS: S1 &S2 heard. No murmur.  Regular rate and  rhythm. Abdomen: Soft, tender dialysis catheter in place, nondistended, bowel sounds present.  Rectal tube in place with loose stool..  Peritoneal dialysis catheter in place. Extremities: No cyanosis, clubbing or edema.  Peripheral pulses are palpable.  Moving extremities. Psych: Alert awake, anxious, tearful, squeezing hands, CNS: Moves all extremities. Skin: Warm and dry.  No rashes noted.   Medications reviewed:  Scheduled Meds:  benztropine  1 mg Per Tube QHS   Chlorhexidine Gluconate Cloth  6 each Topical Daily   feeding supplement (PROSource TF)  45 mL Per Tube BID   FLUoxetine  20 mg Per Tube Daily   gentamicin cream  1 application Topical Daily   heparin injection (subcutaneous)  5,000 Units Subcutaneous Q8H   levETIRAcetam  500 mg Per Tube BID   levothyroxine  50 mcg Per Tube Q breakfast   midodrine  10 mg Per Tube TID WC   multivitamin  1 tablet Per Tube QHS   nystatin  5 mL Per Tube QID   pantoprazole sodium  40 mg Per Tube Daily   risperiDONE  3 mg Per Tube QHS   sodium chloride flush  3 mL Intravenous Q12H   valproic acid  500 mg Per Tube BID   vancomycin variable dose per unstable renal function (pharmacist dosing)   Does  not apply See admin instructions   Continuous Infusions:  sodium chloride Stopped (08/31/21 1917)   dialysis solution 1.5% low-MG/low-CA Stopped (09/04/21 1737)   dialysis solution 2.5% low-MG/low-CA Stopped (09/04/21 1737)   feeding supplement (VITAL 1.5 CAL) 30 mL/hr at 09/05/21 0900   Diet Order             DIET DYS 2 Room service appropriate? Yes; Fluid consistency: Thin  Diet effective now                   Nutrition Problem: Moderate Malnutrition Etiology: chronic illness (ESRD, CHF) Signs/Symptoms: mild fat depletion, moderate muscle depletion Interventions: Tube feeding, Prostat, MVI  Weight change: 0 kg  Wt Readings from Last 3 Encounters:  09/05/21 64.1 kg  06/20/21 58.9 kg  02/04/21 58.9 kg      Consultants: Nephrology,  infectious disease  Procedures: Cortrak tube tube placement Peritoneal dialysis  Antimicrobials: Anti-infectives (From admission, onward)    Start     Dose/Rate Route Frequency Ordered Stop   09/03/21 1500  vancomycin (VANCOREADY) IVPB 1000 mg/200 mL        1,000 mg 200 mL/hr over 60 Minutes Intravenous  Once 09/02/21 1534 09/04/21 0816   09/02/21 2008  vancomycin variable dose per unstable renal function (pharmacist dosing)         Does not apply See admin instructions 09/02/21 2008     08/30/21 1100  vancomycin (VANCOREADY) IVPB 1000 mg/200 mL  Status:  Discontinued        1,000 mg 200 mL/hr over 60 Minutes Intravenous every 72 hours 08/30/21 0928 09/02/21 1534   08/29/21 2200  Ampicillin-Sulbactam (UNASYN) 3 g in sodium chloride 0.9 % 100 mL IVPB        3 g 200 mL/hr over 30 Minutes Intravenous Every 24 hours 08/29/21 1239 09/02/21 2255   08/28/21 2200  ceFEPIme (MAXIPIME) 2 g in sodium chloride 0.9 % 100 mL IVPB  Status:  Discontinued        2 g 200 mL/hr over 30 Minutes Intravenous Every 48 hours 08/27/21 0348 08/29/21 1239   08/28/21 0051  vancomycin variable dose per unstable renal function (pharmacist dosing)  Status:  Discontinued         Does not apply See admin instructions 08/28/21 0052 08/30/21 0936   08/27/21 0330  metroNIDAZOLE (FLAGYL) IVPB 500 mg  Status:  Discontinued        500 mg 100 mL/hr over 60 Minutes Intravenous Every 12 hours 08/27/21 0328 08/29/21 1239   08/27/21 0315  vancomycin (VANCOREADY) IVPB 1500 mg/300 mL        1,500 mg 150 mL/hr over 120 Minutes Intravenous  Once 08/27/21 0303 08/27/21 0654   08/27/21 0215  ceFEPIme (MAXIPIME) 2 g in sodium chloride 0.9 % 100 mL IVPB        2 g 200 mL/hr over 30 Minutes Intravenous  Once 08/27/21 0202 08/27/21 0327      Culture/Microbiology    Component Value Date/Time   SDES BLOOD RIGHT HAND 08/29/2021 1233   SPECREQUEST  08/29/2021 1233    BOTTLES DRAWN AEROBIC AND  ANAEROBIC Blood Culture adequate volume   CULT  08/29/2021 1233    NO GROWTH 5 DAYS Performed at [facility], [place] [address], [place], [place] [address]    REPTSTATUS 09/03/2021 FINAL 08/29/2021 1233      Data Reviewed: I have personally reviewed the following labs and imaging studies.    CBC: Recent Labs  Lab 09/01/21 [phone]  09/02/21 0536 09/03/21 0153 09/04/21 0210 09/05/21 0406  WBC 5.9 5.0 5.0 6.2 6.6  HGB 10.4* 9.7* 9.3* 9.4* 9.1*  HCT 31.6* 30.1* 28.4* 28.6* 27.9*  MCV 85.9 85.8 86.1 86.9 86.9  PLT 136* 128* 132* 117* 120*    Basic Metabolic Panel: Recent Labs  Lab 08/31/21 1813 09/01/21 0637 09/01/21 1546 09/02/21 0534 09/03/21 0153 09/04/21 0210 09/05/21 0406  NA  --  128*  --  130* 128* 128* 129*  K  --  4.3  --  3.3* 3.4* 4.1 4.5  CL  --  97*  --  96* 95* 94* 95*  CO2  --  19*  --  20* 19* 20* 21*  GLUCOSE  --  61*  --  82 119* 104* 93  BUN  --  35*  --  34* 32* 34* 38*  CREATININE  --  4.13*  --  4.13* 4.22* 4.16* 4.21*  CALCIUM  --  8.8*  --  8.7* 8.9 9.0 9.4  MG 2.1 2.0 2.0  --  1.7  --  1.7  PHOS 4.1 4.3 4.6 4.2 3.7  --  3.7    GFR: Estimated Creatinine Clearance: 13.2 mL/min (A) (by C-G formula based on SCr of 4.21 mg/dL (H)). Liver Function Tests: Recent Labs  Lab 09/02/21 0534 09/03/21 0153 09/05/21 0406  AST  --   --  8*  ALT  --   --  9  ALKPHOS  --   --  153*  BILITOT  --   --  0.2*  PROT  --   --  5.0*  ALBUMIN <1.5* <1.5* <1.5*    No results for input(s): LIPASE, AMYLASE in the last 168 hours.  No results for input(s): AMMONIA in the last 168 hours. Coagulation Profile: No results for input(s): INR, PROTIME in the last 168 hours. Cardiac Enzymes: No results for input(s): CKTOTAL, CKMB, CKMBINDEX, TROPONINI in the last 168 hours. BNP (last 3 results) No results for input(s): PROBNP in the last 8760 hours. HbA1C: No results for input(s): HGBA1C in the last 72 hours. CBG: Recent Labs  Lab 09/04/21 1528 09/04/21 1944  09/04/21 2337 09/05/21 0320 09/05/21 0738  GLUCAP 83 79 87 92 76    Lipid Profile: No results for input(s): CHOL, HDL, LDLCALC, TRIG, CHOLHDL, LDLDIRECT in the last 72 hours. Thyroid Function Tests: No results for input(s): TSH, T4TOTAL, FREET4, T3FREE, THYROIDAB in the last 72 hours. Anemia Panel: Recent Labs    09/02/21 1210  VITAMINB12 2,579*  FOLATE 10.2  FERRITIN 1,486*  TIBC <70*  IRON 49  RETICCTPCT 1.1    Sepsis Labs: No results for input(s): PROCALCITON, LATICACIDVEN in the last 168 hours.   Recent Results (from the past 240 hour(s))  Resp Panel by RT-PCR (Flu A&B, Covid) Nasopharyngeal Swab     Status: None   Collection Time: 08/26/21 10:54 PM   Specimen: Nasopharyngeal Swab; Nasopharyngeal(NP) swabs in vial transport medium  Result Value Ref Range Status   SARS Coronavirus 2 by RT PCR NEGATIVE NEGATIVE Final    Comment: (NOTE) SARS-CoV-2 target nucleic acids are NOT DETECTED.  The SARS-CoV-2 RNA is generally detectable in upper respiratory specimens during the acute phase of infection. The lowest concentration of SARS-CoV-2 viral copies this assay can detect is 138 copies/mL. A negative result does not preclude SARS-Cov-2 infection and should not be used as the sole basis for treatment or other patient management decisions. A negative result may occur with  improper specimen collection/handling, submission of specimen other  than nasopharyngeal swab, presence of viral mutation(s) within the areas targeted by this assay, and inadequate number of viral copies(<138 copies/mL). A negative result must be combined with clinical observations, patient history, and epidemiological information. The expected result is Negative.  Fact Sheet for Patients:  EntrepreneurPulse.com.au  Fact Sheet for Healthcare Providers:  IncredibleEmployment.be  This test is no t yet approved or cleared by the [place] FDA and  has been  authorized for detection and/or diagnosis of SARS-CoV-2 by FDA under an Emergency Use Authorization (EUA). This EUA will remain  in effect (meaning this test can be used) for the duration of the COVID-19 declaration under Section 564(b)(1) of the Act, 21 U.S.C.section 360bbb-3(b)(1), unless the authorization is terminated  or revoked sooner.       Influenza A by PCR NEGATIVE NEGATIVE Final   Influenza B by PCR NEGATIVE NEGATIVE Final    Comment: (NOTE) The Xpert Xpress SARS-CoV-2/FLU/RSV plus assay is intended as an aid in the diagnosis of influenza from Nasopharyngeal swab specimens and should not be used as a sole basis for treatment. Nasal washings and aspirates are unacceptable for Xpert Xpress SARS-CoV-2/FLU/RSV testing.  Fact Sheet for Patients: EntrepreneurPulse.com.au  Fact Sheet for Healthcare Providers: IncredibleEmployment.be  This test is not yet approved or cleared by the [place] FDA and has been authorized for detection and/or diagnosis of SARS-CoV-2 by FDA under an Emergency Use Authorization (EUA). This EUA will remain in effect (meaning this test can be used) for the duration of the COVID-19 declaration under Section 564(b)(1) of the Act, 21 U.S.C. section 360bbb-3(b)(1), unless the authorization is terminated or revoked.  Performed at [facility], [place] [address], [place], [place] [address]   Blood culture (routine x 2)     Status: Abnormal   Collection Time: 08/27/21  2:00 AM   Specimen: BLOOD  Result Value Ref Range Status   Specimen Description BLOOD RIGHT ARM  Final   Special Requests   Final    BOTTLES DRAWN AEROBIC AND ANAEROBIC Blood Culture results may not be optimal due to an excessive volume of blood received in culture bottles   Culture  Setup Time   Final    GRAM POSITIVE COCCI AEROBIC BOTTLE ONLY CRITICAL RESULT CALLED TO, READ BACK BY AND VERIFIED WITH: PHARMD GREG ABBOTT 08/28/21@00 :50 BY  TW IN BOTH AEROBIC AND ANAEROBIC BOTTLES    Culture (A)  Final    STAPHYLOCOCCUS EPIDERMIDIS DIPHTHEROIDS(CORYNEBACTERIUM SPECIES) Standardized susceptibility testing for this organism is not available. Performed at [facility], [place] [address], [place],  [address]    Report Status 08/31/2021 FINAL  Final   Organism ID, Bacteria STAPHYLOCOCCUS EPIDERMIDIS  Final      Susceptibility   Staphylococcus epidermidis - MIC*    CIPROFLOXACIN 1 SENSITIVE Sensitive     ERYTHROMYCIN <=0.25 SENSITIVE Sensitive     GENTAMICIN 8 INTERMEDIATE Intermediate     OXACILLIN >=4 RESISTANT Resistant     TETRACYCLINE 2 SENSITIVE Sensitive     VANCOMYCIN 2 SENSITIVE Sensitive     TRIMETH/SULFA 160 RESISTANT Resistant     CLINDAMYCIN <=0.25 SENSITIVE Sensitive     RIFAMPIN <=0.5 SENSITIVE Sensitive     Inducible Clindamycin NEGATIVE Sensitive     * STAPHYLOCOCCUS EPIDERMIDIS  Blood Culture ID Panel (Reflexed)     Status: Abnormal   Collection Time: 08/27/21  2:00 AM  Result Value Ref Range Status   Enterococcus faecalis NOT DETECTED NOT DETECTED Final   Enterococcus Faecium NOT DETECTED NOT  DETECTED Final   Listeria monocytogenes NOT DETECTED NOT DETECTED Final   Staphylococcus species DETECTED (A) NOT DETECTED Final    Comment: CRITICAL RESULT CALLED TO, READ BACK BY AND VERIFIED WITH: PHARMD GREG ABBOTT 08/28/21@00 :50 BY TW    Staphylococcus aureus (BCID) NOT DETECTED NOT DETECTED Final   Staphylococcus epidermidis DETECTED (A) NOT DETECTED Final    Comment: Methicillin (oxacillin) resistant coagulase negative staphylococcus. Possible blood culture contaminant (unless isolated from more than one blood culture draw or clinical case suggests pathogenicity). No antibiotic treatment is indicated for blood  culture contaminants. CRITICAL RESULT CALLED TO, READ BACK BY AND VERIFIED WITH: PHARMD GREG ABBOTT 08/28/21@00 :50 BY TW    Staphylococcus lugdunensis NOT DETECTED NOT DETECTED Final    Streptococcus species NOT DETECTED NOT DETECTED Final   Streptococcus agalactiae NOT DETECTED NOT DETECTED Final   Streptococcus pneumoniae NOT DETECTED NOT DETECTED Final   Streptococcus pyogenes NOT DETECTED NOT DETECTED Final   A.calcoaceticus-baumannii NOT DETECTED NOT DETECTED Final   Bacteroides fragilis NOT DETECTED NOT DETECTED Final   Enterobacterales NOT DETECTED NOT DETECTED Final   Enterobacter cloacae complex NOT DETECTED NOT DETECTED Final   Escherichia coli NOT DETECTED NOT DETECTED Final   Klebsiella aerogenes NOT DETECTED NOT DETECTED Final   Klebsiella oxytoca NOT DETECTED NOT DETECTED Final   Klebsiella pneumoniae NOT DETECTED NOT DETECTED Final   Proteus species NOT DETECTED NOT DETECTED Final   Salmonella species NOT DETECTED NOT DETECTED Final   Serratia marcescens NOT DETECTED NOT DETECTED Final   Haemophilus influenzae NOT DETECTED NOT DETECTED Final   Neisseria meningitidis NOT DETECTED NOT DETECTED Final   Pseudomonas aeruginosa NOT DETECTED NOT DETECTED Final   Stenotrophomonas maltophilia NOT DETECTED NOT DETECTED Final   Candida albicans NOT DETECTED NOT DETECTED Final   Candida auris NOT DETECTED NOT DETECTED Final   Candida glabrata NOT DETECTED NOT DETECTED Final   Candida krusei NOT DETECTED NOT DETECTED Final   Candida parapsilosis NOT DETECTED NOT DETECTED Final   Candida tropicalis NOT DETECTED NOT DETECTED Final   Cryptococcus neoformans/gattii NOT DETECTED NOT DETECTED Final   Methicillin resistance mecA/C DETECTED (A) NOT DETECTED Final    Comment: CRITICAL RESULT CALLED TO, READ BACK BY AND VERIFIED WITH: PHARMD GREG ABBOTT 08/28/21@00 :50 BY TW Performed at [facility], [address], [place], [place] [address]   MRSA Next Gen by PCR, Nasal     Status: None   Collection Time: 08/27/21  3:29 AM   Specimen: Nasal Mucosa; Nasal Swab  Result Value Ref Range Status   MRSA by PCR Next Gen NOT DETECTED NOT DETECTED Final    Comment:  (NOTE) The GeneXpert MRSA Assay (FDA approved for NASAL specimens only), is one component of a comprehensive MRSA colonization surveillance program. It is not intended to diagnose MRSA infection nor to guide or monitor treatment for MRSA infections. Test performance is not FDA approved in patients less than 64 years old. Performed at [facility], [place] [address] [place], [place] [address]   Blood culture (routine x 2)     Status: None   Collection Time: 08/27/21  7:57 AM   Specimen: BLOOD RIGHT FOREARM  Result Value Ref Range Status   Specimen Description BLOOD RIGHT FOREARM  Final   Special Requests   Final    BOTTLES DRAWN AEROBIC AND ANAEROBIC Blood Culture adequate volume   Culture   Final    NO GROWTH 5 DAYS Performed at [facility], [place]  [address], [place], [place] [address]    Report Status 09/01/2021 FINAL  Final  Body fluid culture w Gram Stain     Status: None   Collection Time: 08/27/21  2:16 PM   Specimen: Peritoneal Washings; Body Fluid  Result Value Ref Range Status   Specimen Description PERITONEAL FLUID  Final   Special Requests NONE  Final   Gram Stain   Final    WBC PRESENT,BOTH PMN AND MONONUCLEAR GRAM POSITIVE COCCI CYTOSPIN SMEAR Performed at [facility], [address] [place], [place] [address]    Culture ABUNDANT STAPHYLOCOCCUS EPIDERMIDIS  Final   Report Status 08/30/2021 FINAL  Final   Organism ID, Bacteria STAPHYLOCOCCUS EPIDERMIDIS  Final      Susceptibility   Staphylococcus epidermidis - MIC*    CIPROFLOXACIN 2 INTERMEDIATE Intermediate     ERYTHROMYCIN <=0.25 SENSITIVE Sensitive     GENTAMICIN 4 SENSITIVE Sensitive     OXACILLIN >=4 RESISTANT Resistant     TETRACYCLINE <=1 SENSITIVE Sensitive     VANCOMYCIN 1 SENSITIVE Sensitive     TRIMETH/SULFA 20 SENSITIVE Sensitive     CLINDAMYCIN <=0.25 SENSITIVE Sensitive     RIFAMPIN <=0.5 SENSITIVE Sensitive     Inducible Clindamycin NEGATIVE Sensitive     * ABUNDANT  STAPHYLOCOCCUS EPIDERMIDIS  C Difficile Quick Screen w PCR reflex     Status: Abnormal   Collection Time: 08/28/21 10:25 AM   Specimen: STOOL  Result Value Ref Range Status   C Diff antigen POSITIVE (A) NEGATIVE Final    Comment: RESULTS REPORTED TO RN BEABRAUT 08/28/21 AT 1120 BY NM   C Diff toxin NEGATIVE NEGATIVE Final   C Diff interpretation Results are indeterminate. See PCR results.  Final    Comment: Performed at [facility], [place] [address], [place], [place] [address]  C. Diff by PCR, Reflexed     Status: Abnormal   Collection Time: 08/28/21 10:25 AM  Result Value Ref Range Status   Toxigenic C. Difficile by PCR POSITIVE (A) NEGATIVE Final    Comment: Positive for toxigenic C. difficile with little to no toxin production. Only treat if clinical presentation suggests symptomatic illness. Performed at [facility], [place] [address], [place], [place] [address]   Culture, blood (routine x 2)     Status: None   Collection Time: 08/29/21 12:24 PM   Specimen: BLOOD LEFT HAND  Result Value Ref Range Status   Specimen Description BLOOD LEFT HAND  Final   Special Requests   Final    BOTTLES DRAWN AEROBIC AND ANAEROBIC Blood Culture adequate volume   Culture   Final    NO GROWTH 5 DAYS Performed at [facility], [place] [address] [place], [place] [address]    Report Status 09/03/2021 FINAL  Final  Culture, blood (routine x 2)     Status: None   Collection Time: 08/29/21 12:33 PM   Specimen: BLOOD RIGHT HAND  Result Value Ref Range Status   Specimen Description BLOOD RIGHT HAND  Final   Special Requests   Final    BOTTLES DRAWN AEROBIC AND ANAEROBIC Blood Culture adequate volume   Culture   Final    NO GROWTH 5 DAYS Performed at [facility], [place] [address] [place], [place] [address]    Report Status 09/03/2021 FINAL  Final     Radiology Studies: DG Abd 1 View  Result Date: 09/04/2021 CLINICAL DATA:  Abdominal distension.  Evaluate ileus. EXAM: ABDOMEN -  1 VIEW COMPARISON:  Radiograph 09/01/2021.  CT 08/27/2021 FINDINGS: Stable positioning of weighted enteric tube with tip in the right upper quadrant. Gaseous distention of central small bowel measuring up to 3.4 cm, slightly decreased gaseous distension from prior exam. There is air within the transverse colon. No formed stool is seen. Peritoneal dialysis catheter in place. IMPRESSION: Slight decrease in gaseous distention of central small bowel, suggesting persistent possibly improving ileus. Air is seen in the transverse colon. Electronically Signed   By: Keith Rake M.D.   On: 09/04/2021 15:03     LOS: 9 days   Flora Lipps, MD [facility] 09/05/2021, 10:52 AM

## 2021-09-05 NOTE — Progress Notes (Addendum)
SLP Cancellation/Discharge Note  Patient Details Name: Jane [name] MRN: [id number] DOB: [birth year]   Cancelled treatment:  Pt has been continuing to refuse POs.  There were  no obvious s/sx of dysphagia with aspiration during initial assessment on 11/7.  Given refusals SLP will sign off.    Recommend continuing to offer oral diet as pt is willing.  If  she begins eating and there are concerns for aspiration, please re-consult SLP.   Thank you,  Lela Gell L. Tivis Ringer, [place] CCC/SLP Acute Rehabilitation Services Office number [phone] Pager [phone]       Juan Quam Laurice 09/05/2021, 10:29 AM

## 2021-09-05 NOTE — Progress Notes (Signed)
Nutrition Follow-up  DOCUMENTATION CODES:   Non-severe (moderate) malnutrition in context of chronic illness  INTERVENTION:   - Given chronic malnutrition, persistent ileus, ongoing vomiting on enteral nutrition, and inadequate nutrition since admission (x 9 days), recommend initiation of TPN  Tube feeding via Cortrak tube: - Vital 1.5 @ 30 ml/hr (720 ml/day), advancement per MD  Current tube feeding regimen provides 1080 kcal, 49 grams of protein, and 550 ml of H2O.    RD will monitor for improvement in ileus, tube feeding tolerance, and ability to advance tube feeds to goal regimen: - Vital 1.5 @ 55 ml/hr (1320 ml/day) - ProSource TF 45 ml BID  Tube feeding regimen at goal regimen provides 2060 kcal, 111 grams of protein, and 1008 ml of H2O.  - Continue dysphagia 2 diet with thin liquids as tolerated  - d/c renal MVI given intolerance to enteral nutrition  NUTRITION DIAGNOSIS:   Moderate Malnutrition related to chronic illness (ESRD, CHF) as evidenced by mild fat depletion, moderate muscle depletion.  Ongoing, being addressed via initiation of TPN  GOAL:   Patient will meet greater than or equal to 90% of their needs  Unmet at this time, being addressed via initiation of TPN  MONITOR:   PO intake, Labs, Weight trends, TF tolerance, Skin, I & O's, Other (TPN)  REASON FOR ASSESSMENT:   Consult Enteral/tube feeding initiation and management  ASSESSMENT:   55 year old female who presented to the ED on 11/04 with N/V/D x 1 week. PMH of ESRD on PD at home, HTN, HLD, schizophrenia, seizures, CHF, hypothyroidism, discitis/osteomyelitis and epidural abscess in April 2022. Pt admitted with sepsis, aspiration pneumonia, spontaneous bacterial peritonitis.  11/05 - transferred to ICU 11/06 - NPO 11/07 - diet advanced to dysphagia 2 with thin liquids 11/09 - Cortrak placed (tip gastric), tube feeds started at trickle rate 11/10 - pt with vomiting in early AM, tube feeds  held, KUB showing ileus, reglan started, tube feeds restarted at trickle rate 11/13 - projectile vomiting, tube feeds held  Discussed pt with RN and during ICU rounds. Pt with tube feeds infusing at 30 ml/hr and no N/V at this time. Pt did not consume anything from her breakfast meal tray. Per notes, pt with projectile vomiting of tube feeds on 11/13. Pt continues to have diarrhea with rectal tube in place. Reglan has been d/c. RD will also d/c NutriSource Fiber per discussion with MD as this is unlikely to promote tolerance of enteral nutrition and may lead to additional vomiting.  Discussed recommendation for TPN with MD. Pt with chronic malnutrition, persistent ileus, ongoing vomiting on enteral nutrition, and inadequate nutrition since admission (x 9 days). Pt's has been unable to tolerate tube feeds at goal. Per Pharmacy, TPN to start tomorrow since consult was placed after 1200. Plan for PICC placement today. Nephrology on board and recommending Palliative Medicine consult for [place].  Admit weight: 62.5 kg Current weight: 64.1 kg  Pt with moderate pitting edema to LLE.  Medications reviewed and include: rena-vit, nystatin, protonix  Labs reviewed: sodium 129, BUN 38, creatinine 4.21, hemoglobin 9.1, platelets 120 CBG's: 76-92 x 24 hours  Rectal tube: 350 ml x 24 hours I/O's: +2.4 L since admit  Diet Order:   Diet Order             DIET DYS 2 Room service appropriate? Yes; Fluid consistency: Thin  Diet effective now  EDUCATION NEEDS:   Not appropriate for education at this time  Skin:  Skin Assessment: Skin Integrity Issues: Other: MASD to groin and buttocks, wound to R heel  Last BM:  09/04/21 rectal tube  Height:   Ht Readings from Last 1 Encounters:  08/28/21 5\' 4"  (1.626 m)    Weight:   Wt Readings from Last 1 Encounters:  09/05/21 64.1 kg    BMI:  Body mass index is 24.26 kg/m.  Estimated Nutritional Needs:   Kcal:   1900-2100  Protein:  90-110 grams  Fluid:  1000 ml + UOP    Gustavus Bryant, MS, RD, LDN Inpatient Clinical Dietitian Please see [facility] for contact information.

## 2021-09-05 NOTE — Progress Notes (Signed)
Peripherally Inserted Central Catheter Placement  The IV Nurse has discussed with the patient and/or persons authorized to consent for the patient, the purpose of this procedure and the potential benefits and risks involved with this procedure.  The benefits include less needle sticks, lab draws from the catheter, and the patient may be discharged home with the catheter. Risks include, but not limited to, infection, bleeding, blood clot (thrombus formation), and puncture of an artery; nerve damage and irregular heartbeat and possibility to perform a PICC exchange if needed/ordered by physician.  Alternatives to this procedure were also discussed.  Bard Power PICC patient education guide, fact sheet on infection prevention and patient information card has been provided to patient /or left at bedside.    PICC Placement Documentation  PICC Double Lumen 09/05/21 PICC Right Brachial 33 cm 0 cm (Active)  Indication for Insertion or Continuance of Line Administration of hyperosmolar/irritating solutions (i.e. TPN, Vancomycin, etc.) 09/05/21 1717  Exposed Catheter (cm) 0 cm 09/05/21 1717  Site Assessment Clean;Dry;Intact 09/05/21 1717  Lumen #1 Status Flushed;Saline locked;Blood return noted 09/05/21 1717  Lumen #2 Status Flushed;Saline locked;Blood return noted 09/05/21 1717  Dressing Type Transparent;Securing device 09/05/21 1717  Dressing Status Clean;Dry;Intact 09/05/21 1717  Antimicrobial disc in place? Yes 09/05/21 1717  Dressing Intervention Other (Comment) 09/05/21 1717  Dressing Change Due 09/12/21 09/05/21 1717    PICC placed on RUA, on patient's dominant arm.Consent signed by patient's sister, Fidela Salisbury, via telephone consent.Verified by 2 PICC RNs.   Enos Fling 09/05/2021, 5:19 PM

## 2021-09-05 NOTE — Progress Notes (Signed)
Physical Therapy Treatment Patient Details Name: Jane [name] MRN: [id number] DOB: [birth year] Today's Date: 09/05/2021   History of Present Illness 55 y/o female admitted 08/27/21 with n/v/d, abdominal pain and cough. Found with sepsis, possible aspiration PNA, and cdiff. PMH includes: ESRD on PD, schizoaffective disorder,cognitive impairment, prior discitis/osteomyelitis/epidural abscess.    PT Comments    Pt maintaining eyes closed throughout session but able to assist with rolling with bending knee and reaching. Pt assisting with weight bearing in standing and pivot to chair with pt able to follow commands with delay and multimodal cues to perform limited HEP in sitting. Pt benefits from reassurance and education for task throughout session.     Recommendations for follow up therapy are one component of a multi-disciplinary discharge planning process, led by the attending physician.  Recommendations may be updated based on patient status, additional functional criteria and insurance authorization.  Follow Up Recommendations  Home health PT     Assistance Recommended at Discharge Frequent or constant Supervision/Assistance  Equipment Recommendations  None recommended by PT    Recommendations for Other Services       Precautions / Restrictions Precautions Precautions: Fall Precaution Comments: cortrak, flexiseal Restrictions Weight Bearing Restrictions: No     Mobility  Bed Mobility Overal bed mobility: Needs Assistance Bed Mobility: Rolling;Sidelying to Sit Rolling: Max assist Sidelying to sit: Max assist;+2 for physical assistance       General bed mobility comments: pt with max assist to roll bil with pad for pericare and max +2 to bring leg off surface and elevate trunk from surface. Min-minguard for sitting balance    Transfers Overall transfer level: Needs assistance   Transfers: Sit to/from Stand;Bed to chair/wheelchair/BSC Sit to Stand: +2 physical  assistance;Max assist Stand pivot transfers: Max assist;+2 physical assistance         General transfer comment: max +2 with knees blocked and pad to cradle sacrum to stand from bed and pivot to chair. Pt assisting with standing and pivot. Total +2 to slide back in chair    Ambulation/Gait               General Gait Details: Unable. Nonambulatory since April   Stairs             Wheelchair Mobility    Modified Rankin (Stroke Patients Only)       Balance Overall balance assessment: Needs assistance Sitting-balance support: Bilateral upper extremity supported;Feet unsupported Sitting balance-Leahy Scale: Poor Sitting balance - Comments: EOb with minguard-min assist     Standing balance-Leahy Scale: Zero Standing balance comment: max +2 assist                            Cognition Arousal/Alertness: Awake/alert (pt awake and responding but maintaining eyes closed) Behavior During Therapy: Flat affect Overall Cognitive Status: No family/caregiver present to determine baseline cognitive functioning                                 General Comments: pt with history of cognitive impairments but no family present. Pt able to state she is at the hospital but all other verbalizations more of a moan and difficult to understand with pt following single step commands with repetition and delay        Exercises General Exercises - Lower Extremity Long Arc Quad: AAROM;Both;Seated;10 reps    General Comments  Pertinent Vitals/Pain Pain Assessment: PAINAD Facial Expression: Relaxed, neutral Body Movements: Absence of movements Muscle Tension: Relaxed Compliance with ventilator (intubated pts.): N/A Vocalization (extubated pts.): Sighing, moaning CPOT Total: 1 Pain Intervention(s): Monitored during session;Repositioned    Home Living                          Prior Function            PT Goals (current goals can now  be found in the care plan section) Progress towards PT goals: Progressing toward goals    Frequency    Min 3X/week      PT Plan Current plan remains appropriate    Co-evaluation PT/OT/SLP Co-Evaluation/Treatment: Yes Reason for Co-Treatment: Necessary to address cognition/behavior during functional activity;For patient/therapist safety;To address functional/ADL transfers PT goals addressed during session: Mobility/safety with mobility;Balance;Strengthening/ROM OT goals addressed during session: Strengthening/ROM;ADL's and self-care      AM-PAC PT "6 Clicks" Mobility   Outcome Measure  Help needed turning from your back to your side while in a flat bed without using bedrails?: Total Help needed moving from lying on your back to sitting on the side of a flat bed without using bedrails?: Total Help needed moving to and from a bed to a chair (including a wheelchair)?: Total Help needed standing up from a chair using your arms (e.g., wheelchair or bedside chair)?: Total Help needed to walk in hospital room?: Total Help needed climbing 3-5 steps with a railing? : Total 6 Click Score: 6    End of Session Equipment Utilized During Treatment: Gait belt Activity Tolerance: Patient limited by fatigue Patient left: in chair;with call bell/phone within reach;with chair alarm set Nurse Communication: Mobility status PT Visit Diagnosis: Other abnormalities of gait and mobility (R26.89);Muscle weakness (generalized) (M62.81)     Time: 4536-4680 PT Time Calculation (min) (ACUTE ONLY): 28 min  Charges:  $Therapeutic Activity: 8-22 mins                     Pranika Finks P, PT Acute Rehabilitation Services Pager: [phone] Office: [place] Rilla Buckman 09/05/2021, 10:58 AM

## 2021-09-05 NOTE — Progress Notes (Signed)
Spoke with Dr.Schertz via secure chat, to place PICC on dominant arm.

## 2021-09-05 NOTE — Evaluation (Incomplete)
Occupational Therapy Evaluation Patient Details Name: Jane [name] MRN: [id number] DOB: 02/12/1966 Today's Date: 09/05/2021   History of Present Illness 55 y/o female admitted 08/27/21 with n/v/d, abdominal pain and cough. Found with sepsis, possible aspiration PNA, and cdiff. PMH includes: ESRD on PD, schizoaffective disorder,cognitive impairment, prior discitis/osteomyelitis/epidural abscess.   Clinical Impression   This 55 yo female admitted with above was seen today with PT to see if we could advance mobility with her. She was able to do more with [place] today (from a total A +2 to Max A-Max A +2 for all mobility) and able to wash her face slightly. She will continue to benefit from acute OT with follow up [place].      Recommendations for follow up therapy are one component of a multi-disciplinary discharge planning process, led by the attending physician.  Recommendations may be updated based on patient status, additional functional criteria and insurance authorization.   Follow Up Recommendations  Home health OT    Assistance Recommended at Discharge Frequent or constant Supervision/Assistance  Functional Status Assessment  Patient has had a recent decline in their functional status and demonstrates the ability to make significant improvements in function in a reasonable and predictable amount of time.  Equipment Recommendations  None recommended by OT       Precautions / Restrictions Precautions Precautions: Fall Precaution Comments: cortrak, flexiseal Restrictions Weight Bearing Restrictions: No      Mobility Bed Mobility Overal bed mobility: Needs Assistance Bed Mobility: Rolling;Sidelying to Sit Rolling: Max assist Sidelying to sit: Max assist;+2 for physical assistance       General bed mobility comments: pt with max assist to roll bil with pad for pericare and max +2 to bring leg off surface and elevate trunk from surface. Min-minguard for sitting balance     Transfers Overall transfer level: Needs assistance   Transfers: Sit to/from Stand;Bed to chair/wheelchair/BSC Sit to Stand: +2 physical assistance;Max assist Stand pivot transfers: Max assist;+2 physical assistance         General transfer comment: max +2 with knees blocked and pad to cradle sacrum to stand from bed and pivot to chair. Pt assisting with standing and pivot. Total +2 to slide back in chair      Balance Overall balance assessment: Needs assistance Sitting-balance support: Bilateral upper extremity supported;Feet unsupported Sitting balance-Leahy Scale: Poor Sitting balance - Comments: EOb with minguard-min assist     Standing balance-Leahy Scale: Zero Standing balance comment: max +2 assist                           ADL either performed or assessed with clinical judgement   ADL Overall ADL's : Needs assistance/impaired     Grooming: Wash/dry face;Total assistance Grooming Details (indicate cue type and reason): Pt would with encourgement bring washcloth to face and wipe slightly, needed encouragement to be more thorough and physical A                 Toilet Transfer: Maximal assistance;+2 for physical assistance;Stand-pivot Toilet Transfer Details (indicate cue type and reason): simulated bed>recliner on pt's right                 Vision   Additional Comments: Kept eyes shut 95% of session            Pertinent Vitals/Pain Pain Assessment: PAINAD Facial Expression: Relaxed, neutral Body Movements: Absence of movements Muscle Tension: Relaxed Compliance with ventilator (intubated pts.): N/A  Vocalization (extubated pts.): Sighing, moaning CPOT Total: 1 Pain Intervention(s): Monitored during session;Repositioned     Hand Dominance  right   Extremity/Trunk Assessment Upper Extremity Assessment Upper Extremity Assessment: Generalized weakness (Can move both arms/hands, but tends to move them elbow distally more spontaneously  than at shoulders)           Communication  mumbles   Cognition Arousal/Alertness: Awake/alert (pt awake and responding but maintaining eyes closed) Behavior During Therapy: Flat affect Overall Cognitive Status: No family/caregiver present to determine baseline cognitive functioning                                 General Comments: pt with history of cognitive impairments but no family present. Pt able to state she is at the hospital but all other verbalizations more of a moan and difficult to understand with pt following single step commands with repetition and delay     General Comments       Exercises General Exercises - Lower Extremity Long Arc Quad: AAROM;Both;Seated;10 reps   Shoulder Instructions      Home Living                                          Prior Functioning/Environment                          OT Problem List: Decreased strength;Decreased range of motion;Impaired balance (sitting and/or standing);Impaired UE functional use;Decreased cognition;Decreased safety awareness      OT Treatment/Interventions: Self-care/ADL training;Therapeutic exercise;Therapeutic activities;Balance training;Patient/family education    OT Goals(Current goals can be found in the care plan section) Acute Rehab OT Goals Patient Stated Goal: unable to state OT Goal Formulation: Patient unable to participate in goal setting Time For Goal Achievement: 09/16/21  OT Frequency: Min 2X/week   Barriers to D/C:            Co-evaluation PT/OT/SLP Co-Evaluation/Treatment: Yes Reason for Co-Treatment: Necessary to address cognition/behavior during functional activity;For patient/therapist safety;To address functional/ADL transfers PT goals addressed during session: Mobility/safety with mobility;Balance;Strengthening/ROM OT goals addressed during session: Strengthening/ROM;ADL's and self-care      AM-PAC OT "6 Clicks" Daily Activity      Outcome Measure Help from another person eating meals?: Total Help from another person taking care of personal grooming?: A Lot Help from another person toileting, which includes using toliet, bedpan, or urinal?: Total Help from another person bathing (including washing, rinsing, drying)?: Total Help from another person to put on and taking off regular upper body clothing?: Total Help from another person to put on and taking off regular lower body clothing?: Total 6 Click Score: 7   End of Session Nurse Communication: Mobility status;Need for lift equipment  Activity Tolerance: Patient tolerated treatment well Patient left: in chair;with call bell/phone within reach;with chair alarm set (hoyer pad under her)  OT Visit Diagnosis: Other abnormalities of gait and mobility (R26.89);Muscle weakness (generalized) (M62.81);Other symptoms and signs involving cognitive function;Cognitive communication deficit (R41.841)                Time: 5364-6803 OT Time Calculation (min): 31 min Charges:  OT General Charges $OT Visit: 1 Visit OT Treatments $Self Care/Home Management : 8-22 mins  {enter signature dotphrase here}  Almon Register 09/05/2021, 11:21 AM

## 2021-09-05 NOTE — Progress Notes (Signed)
Occupational Therapy Treatment Patient Details Name: Jane [name] MRN: [id number] DOB: 10/17/1966 Today's Date: 09/05/2021   History of present illness 55 y/o female admitted 08/27/21 with n/v/d, abdominal pain and cough. Found with sepsis, possible aspiration PNA, and cdiff. PMH includes: ESRD on PD, schizoaffective disorder,cognitive impairment, prior discitis/osteomyelitis/epidural abscess.   OT comments  This 55 yo female admitted with above was seen today with PT to see if we could advance mobility with her. She was able to do more with [place] today (from a total A +2 to Max A-Max A +2 for all mobility) and able to wash her face slightly. She will continue to benefit from acute OT with follow up [place].    Recommendations for follow up therapy are one component of a multi-disciplinary discharge planning process, led by the attending physician.  Recommendations may be updated based on patient status, additional functional criteria and insurance authorization.    Follow Up Recommendations  Home health OT    Assistance Recommended at Discharge Frequent or constant Supervision/Assistance  Equipment Recommendations  None recommended by OT       Precautions / Restrictions Precautions Precautions: Fall Precaution Comments: cortrak, flexiseal Restrictions Weight Bearing Restrictions: No       Mobility Bed Mobility Overal bed mobility: Needs Assistance Bed Mobility: Rolling;Sidelying to Sit Rolling: Max assist Sidelying to sit: Max assist;+2 for physical assistance       General bed mobility comments: pt with max assist to roll bil with pad for pericare and max +2 to bring leg off surface and elevate trunk from surface. Min-minguard for sitting balance    Transfers Overall transfer level: Needs assistance   Transfers: Sit to/from Stand;Bed to chair/wheelchair/BSC Sit to Stand: +2 physical assistance;Max assist Stand pivot transfers: Max assist;+2 physical assistance          General transfer comment: max +2 with knees blocked and pad to cradle sacrum to stand from bed and pivot to chair. Pt assisting with standing and pivot. Total +2 to slide back in chair     Balance Overall balance assessment: Needs assistance Sitting-balance support: Bilateral upper extremity supported;Feet unsupported Sitting balance-Leahy Scale: Poor Sitting balance - Comments: EOb with minguard-min assist     Standing balance-Leahy Scale: Zero Standing balance comment: max +2 assist                           ADL either performed or assessed with clinical judgement   ADL Overall ADL's : Needs assistance/impaired     Grooming: Wash/dry face;Total assistance Grooming Details (indicate cue type and reason): Pt would with encourgement bring washcloth to face and wipe slightly, needed encouragement to be more thorough and physical A                 Toilet Transfer: Maximal assistance;+2 for physical assistance;Stand-pivot Toilet Transfer Details (indicate cue type and reason): simulated bed>recliner on pt's right                Extremity/Trunk Assessment Upper Extremity Assessment Upper Extremity Assessment: Generalized weakness (Can move both arms/hands, but tends to move them elbow distally more spontaneously than at shoulders)            Vision   Additional Comments: Kept eyes shut 95% of session          Cognition Arousal/Alertness: Awake/alert (pt awake and responding but maintaining eyes closed) Behavior During Therapy: Flat affect Overall Cognitive Status: No family/caregiver present to determine  baseline cognitive functioning                                 General Comments: pt with history of cognitive impairments but no family present. Pt able to state she is at the hospital but all other verbalizations more of a moan and difficult to understand with pt following single step commands with repetition and delay           Exercises General Exercises - Lower Extremity Long Arc Quad: AAROM;Both;Seated;10 reps          Pertinent Vitals/ Pain       Pain Assessment: PAINAD Facial Expression: Relaxed, neutral Body Movements: Absence of movements Muscle Tension: Relaxed Compliance with ventilator (intubated pts.): N/A Vocalization (extubated pts.): Sighing, moaning CPOT Total: 1 Pain Intervention(s): Monitored during session;Repositioned         Frequency  Min 2X/week        Progress Toward Goals  OT Goals(current goals can now be found in the care plan section)  Progress towards OT goals: Progressing toward goals  Acute Rehab OT Goals Patient Stated Goal: unable to state OT Goal Formulation: Patient unable to participate in goal setting Time For Goal Achievement: 09/16/21 Potential to Achieve Goals: Good  Plan      Co-evaluation    PT/OT/SLP Co-Evaluation/Treatment: Yes Reason for Co-Treatment: Necessary to address cognition/behavior during functional activity;For patient/therapist safety;To address functional/ADL transfers PT goals addressed during session: Mobility/safety with mobility;Balance;Strengthening/ROM OT goals addressed during session: Strengthening/ROM;ADL's and self-care      AM-PAC OT "6 Clicks" Daily Activity     Outcome Measure   Help from another person eating meals?: Total Help from another person taking care of personal grooming?: A Lot Help from another person toileting, which includes using toliet, bedpan, or urinal?: Total Help from another person bathing (including washing, rinsing, drying)?: Total Help from another person to put on and taking off regular upper body clothing?: Total Help from another person to put on and taking off regular lower body clothing?: Total 6 Click Score: 7    End of Session    OT Visit Diagnosis: Other abnormalities of gait and mobility (R26.89);Muscle weakness (generalized) (M62.81);Other symptoms and signs involving  cognitive function;Cognitive communication deficit (R41.841)   Activity Tolerance Patient tolerated treatment well   Patient Left in chair;with call bell/phone within reach;with chair alarm set (hoyer pad under her)   Nurse Communication Mobility status;Need for lift equipment        Time: 7253-6644 OT Time Calculation (min): 31 min  Charges: OT General Charges $OT Visit: 1 Visit OT Treatments $Self Care/Home Management : 8-22 mins  [place], OTR/L Acute [employer] Pager [phone] Office [phone]     Almon Register 09/05/2021, 11:23 AM

## 2021-09-05 NOTE — Progress Notes (Addendum)
[facility] Progress Note   Subjective:  seen in room, hollering out, crying out  Objective Vitals:   09/05/21 0800 09/05/21 0900 09/05/21 1000 09/05/21 1119  BP: 113/74 115/74    Pulse: (!) 109 (!) 112 (!) 119   Resp: (!) 24 (!) 27 (!) 27   Temp: 98.8 F (37.1 C)   97.9 F (36.6 C)  TempSrc: Oral   Oral  SpO2: 98% 97% 94%   Weight:      Height:         Additional Objective Labs: Basic Metabolic Panel: Recent Labs  Lab 09/02/21 0534 09/03/21 0153 09/04/21 0210 09/05/21 0406  NA 130* 128* 128* 129*  K 3.3* 3.4* 4.1 4.5  CL 96* 95* 94* 95*  CO2 20* 19* 20* 21*  GLUCOSE 82 119* 104* 93  BUN 34* 32* 34* 38*  CREATININE 4.13* 4.22* 4.16* 4.21*  CALCIUM 8.7* 8.9 9.0 9.4  PHOS 4.2 3.7  --  3.7    CBC: Recent Labs  Lab 09/01/21 0637 09/02/21 0536 09/03/21 0153 09/04/21 0210 09/05/21 0406  WBC 5.9 5.0 5.0 6.2 6.6  HGB 10.4* 9.7* 9.3* 9.4* 9.1*  HCT 31.6* 30.1* 28.4* 28.6* 27.9*  MCV 85.9 85.8 86.1 86.9 86.9  PLT 136* 128* 132* 117* 120*        Physical Exam General: alert, up in bed, cortrak in place Heart: RRR No m,r,g Lungs: Clear bilaterally  Abdomen: soft, nontender today Rectal: rectal tube in place draining liquid brown stool Extremities: no sig LE edema Dialysis Access: PD cath in place   Medications:  sodium chloride Stopped (08/31/21 1917)   dialysis solution 1.5% low-MG/low-CA Stopped (09/04/21 1737)   dialysis solution 2.5% low-MG/low-CA Stopped (09/04/21 1737)   feeding supplement (VITAL 1.5 CAL) 30 mL/hr at 09/05/21 0900    benztropine  1 mg Per Tube QHS   Chlorhexidine Gluconate Cloth  6 each Topical Daily   feeding supplement (PROSource TF)  45 mL Per Tube BID   FLUoxetine  20 mg Per Tube Daily   gentamicin cream  1 application Topical Daily   heparin injection (subcutaneous)  5,000 Units Subcutaneous Q8H   levETIRAcetam  500 mg Per Tube BID   levothyroxine  50 mcg Per Tube Q breakfast   midodrine  10 mg Per Tube  TID WC   multivitamin  1 tablet Per Tube QHS   nystatin  5 mL Per Tube QID   pantoprazole sodium  40 mg Per Tube Daily   risperiDONE  3 mg Per Tube QHS   sodium chloride flush  3 mL Intravenous Q12H   valproic acid  500 mg Per Tube BID   vancomycin variable dose per unstable renal function (pharmacist dosing)   Does not apply See admin instructions    Dialysis Orders:  [place] [phone] (HT not open on weekends)  OP Nephrologist: Dimas Chyle  Ambulatory PD  7x/week  4 exchanges 2L    Assessment/Plan: Sepsis - due to asp PNA and PD cath related peritonitis Aspiration PNA - +bld cx thought to be contaminant.   ID signed off. IV unasyn for asp PNA. Diarrhea/ vomiting - rectal tube in place, Cdif Ag+, toxin negative. Conservative Rx.  Malnutrition - dysphagia diet rec'd by nutrition. Alb < 1.5. TPN being considered.  MRSE PD cath related acute peritonitis - f/u cell count showed sig improvement on 11/8. No e/o tunnel infection. Cont Vancomycin (IV here, IP when dc'd) for 2 wks total thru 11/18 per  ID.  Hypotension/volume - sp pressor support, is now getting midodrine 10 tid. BPs are low- normal now.  ESRD - will continue CCPD nightly, no edema on exam today. Will use combo 1.5% and 2.5%.  Anemia  - Hgb 8s > 9s> 10s > 9s.  Consider esa if falls lower.  Metabolic bone disease -  Corr Ca/Phos ok.  Nutrition - Alb low.  Prot supp when taking PO Mental retardation Seizure d/o Schizoaffective/Bipolar disorder - per primary  Hypokalemia - requiring ongoing repletion PRN.  Prognosis - overall poor prognosis. Would consider GOC w/ palliative team involvement   08/31/21 - Dr Johnney Ou spoke to RN at her dialysis clinic - has 2 family members (sister and BIL) who do her PD treatments for her and have been providing essentially total care to her recently.  Her BP chronically runs on the low side.  Her current clinical status doesn't seem far off her baseline actually, so it's  reasonable to continue her PD.  Spoke to [employer]  her sister who agrees she and husband can continue the current level of care for Rubi and would prefer not to switch to HD.  Ongoing discussion as course progresses.    Kelly Splinter, MD 09/05/2021, 11:58 AM

## 2021-09-06 DIAGNOSIS — T8571XD Infection and inflammatory reaction due to peritoneal dialysis catheter, subsequent encounter: Secondary | ICD-10-CM | POA: Diagnosis not present

## 2021-09-06 DIAGNOSIS — J690 Pneumonitis due to inhalation of food and vomit: Secondary | ICD-10-CM | POA: Diagnosis not present

## 2021-09-06 DIAGNOSIS — Z221 Carrier of other intestinal infectious diseases: Secondary | ICD-10-CM | POA: Diagnosis not present

## 2021-09-06 DIAGNOSIS — A419 Sepsis, unspecified organism: Secondary | ICD-10-CM | POA: Diagnosis not present

## 2021-09-06 LAB — CBC
HCT: 27.2 % — ABNORMAL LOW (ref 36.0–46.0)
Hemoglobin: 8.9 g/dL — ABNORMAL LOW (ref 12.0–15.0)
MCH: 28.2 pg (ref 26.0–34.0)
MCHC: 32.7 g/dL (ref 30.0–36.0)
MCV: 86.1 fL (ref 80.0–100.0)
Platelets: 136 10*3/uL — ABNORMAL LOW (ref 150–400)
RBC: 3.16 MIL/uL — ABNORMAL LOW (ref 3.87–5.11)
RDW: 24.1 % — ABNORMAL HIGH (ref 11.5–15.5)
WBC: 6.7 10*3/uL (ref 4.0–10.5)
nRBC: 0 % (ref 0.0–0.2)

## 2021-09-06 LAB — GLUCOSE, CAPILLARY
Glucose-Capillary: 97 mg/dL (ref 70–99)
Glucose-Capillary: 83 mg/dL (ref 70–99)
Glucose-Capillary: 81 mg/dL (ref 70–99)
Glucose-Capillary: 72 mg/dL (ref 70–99)
Glucose-Capillary: 92 mg/dL (ref 70–99)
Glucose-Capillary: 136 mg/dL — ABNORMAL HIGH (ref 70–99)

## 2021-09-06 LAB — COMPREHENSIVE METABOLIC PANEL
ALT: 6 U/L (ref 0–44)
AST: 14 U/L — ABNORMAL LOW (ref 15–41)
Albumin: <1.5 g/dL — ABNORMAL LOW (ref 3.5–5.0)
Alkaline Phosphatase: 160 U/L — ABNORMAL HIGH (ref 38–126)
Anion gap: 16 — ABNORMAL HIGH (ref 5–15)
BUN: 46 mg/dL — ABNORMAL HIGH (ref 6–20)
CO2: 21 mmol/L — ABNORMAL LOW (ref 22–32)
Calcium: 9.3 mg/dL (ref 8.9–10.3)
Chloride: 90 mmol/L — ABNORMAL LOW (ref 98–111)
Creatinine, Ser: 4.32 mg/dL — ABNORMAL HIGH (ref 0.44–1.00)
GFR, Estimated: 12 mL/min — ABNORMAL LOW (ref >60)
Glucose, Bld: 98 mg/dL (ref 70–99)
Potassium: 4.6 mmol/L (ref 3.5–5.1)
Sodium: 127 mmol/L — ABNORMAL LOW (ref 135–145)
Total Bilirubin: 0.6 mg/dL (ref 0.3–1.2)
Total Protein: 5.2 g/dL — ABNORMAL LOW (ref 6.5–8.1)

## 2021-09-06 LAB — VANCOMYCIN, RANDOM: Vancomycin Rm: 34

## 2021-09-06 LAB — TRIGLYCERIDES: Triglycerides: 161 mg/dL — ABNORMAL HIGH (ref <150)

## 2021-09-06 LAB — PHOSPHORUS: Phosphorus: 4.1 mg/dL (ref 2.5–4.6)

## 2021-09-06 LAB — MAGNESIUM: Magnesium: 1.7 mg/dL (ref 1.7–2.4)

## 2021-09-06 MED ORDER — TRACE MINERALS CU-MN-SE-ZN 300-55-60-3000 MCG/ML IV SOLN
INTRAVENOUS | Status: AC
  Filled 2021-09-06: qty 312

## 2021-09-06 MED ORDER — DARBEPOETIN ALFA 40 MCG/0.4ML IJ SOSY
40.0000 ug | PREFILLED_SYRINGE | INTRAMUSCULAR | Status: DC
Start: 2021-09-06 — End: 2021-09-09
  Administered 2021-09-06: 40 ug via SUBCUTANEOUS
  Filled 2021-09-06 (×2): qty 0.4

## 2021-09-06 MED ORDER — SODIUM CHLORIDE 0.9% FLUSH
10.0000 mL | Freq: Two times a day (BID) | INTRAVENOUS | Status: DC
  Administered 2021-09-06 – 2021-09-11 (×5): 10 mL

## 2021-09-06 MED ORDER — MAGNESIUM SULFATE 2 GM/50ML IV SOLN: 2.0000 g | Freq: Once | INTRAVENOUS | Status: DC

## 2021-09-06 MED ORDER — DELFLEX-LC/4.25% DEXTROSE 483 MOSM/L IP SOLN: INTRAPERITONEAL | Status: DC

## 2021-09-06 MED ORDER — SODIUM CHLORIDE 0.9% FLUSH: 10.0000 mL | INTRAVENOUS | Status: DC | PRN

## 2021-09-06 MED ORDER — INSULIN ASPART 100 UNIT/ML IJ SOLN: 0.0000 [IU] | INTRAMUSCULAR | Status: DC

## 2021-09-06 MED ORDER — MAGNESIUM SULFATE IN D5W 1-5 GM/100ML-% IV SOLN
1.0000 g | Freq: Once | INTRAVENOUS | Status: AC
  Administered 2021-09-06: 1 g via INTRAVENOUS
  Filled 2021-09-06: qty 100

## 2021-09-06 MED ORDER — DEXTROMETHORPHAN POLISTIREX ER 30 MG/5ML PO SUER
30.0000 mg | Freq: Every evening | ORAL | Status: DC | PRN
  Administered 2021-09-06 – 2021-09-10 (×4): 30 mg
  Filled 2021-09-06 (×6): qty 5

## 2021-09-06 MED ORDER — DELFLEX-LC/2.5% DEXTROSE 394 MOSM/L IP SOLN: INTRAPERITONEAL | Status: DC

## 2021-09-06 NOTE — Progress Notes (Signed)
[facility] Progress Note   Subjective:  seen in room, not responsding verbally  Objective Vitals:   09/06/21 1000 09/06/21 1100 09/06/21 1158 09/06/21 1200  BP:  130/82  126/80  Pulse: (!) 111 (!) 104  100  Resp:    (!) 23  Temp:   98.4 F (36.9 C)   TempSrc:   Oral   SpO2:    97%  Weight:      Height:         Additional Objective Labs: Basic Metabolic Panel: Recent Labs  Lab 09/03/21 0153 09/04/21 0210 09/05/21 0406 09/06/21 0315  NA 128* 128* 129* 127*  K 3.4* 4.1 4.5 4.6  CL 95* 94* 95* 90*  CO2 19* 20* 21* 21*  GLUCOSE 119* 104* 93 98  BUN 32* 34* 38* 46*  CREATININE 4.22* 4.16* 4.21* 4.32*  CALCIUM 8.9 9.0 9.4 9.3  PHOS 3.7  --  3.7 4.1    CBC: Recent Labs  Lab 09/02/21 0536 09/03/21 0153 09/04/21 0210 09/05/21 0406 09/06/21 0315  WBC 5.0 5.0 6.2 6.6 6.7  HGB 9.7* 9.3* 9.4* 9.1* 8.9*  HCT 30.1* 28.4* 28.6* 27.9* 27.2*  MCV 85.8 86.1 86.9 86.9 86.1  PLT 128* 132* 117* 120* 136*        Physical Exam General: alert, up in bed, cortrak in place Heart: RRR No m,r,g Lungs: Clear bilaterally  Abdomen: soft, nontender today Rectal: rectal tube in place draining liquid brown stool Extremities: no sig LE edema Dialysis Access: PD cath in place   Medications:  sodium chloride Stopped (08/31/21 1917)   dialysis solution 1.5% low-MG/low-CA     dialysis solution 2.5% low-MG/low-CA     feeding supplement (VITAL 1.5 CAL) 1,000 mL (09/05/21 1550)   magnesium sulfate bolus IVPB 1 g (09/06/21 1237)   TPN ADULT (ION)      benztropine  1 mg Per Tube QHS   Chlorhexidine Gluconate Cloth  6 each Topical Daily   darbepoetin (ARANESP) injection - DIALYSIS  40 mcg Subcutaneous Q Tue-1800   FLUoxetine  20 mg Per Tube Daily   gentamicin cream  1 application Topical Daily   heparin injection (subcutaneous)  5,000 Units Subcutaneous Q8H   insulin aspart  0-6 Units Subcutaneous Q4H   levETIRAcetam  500 mg Per Tube BID   levothyroxine  50 mcg  Per Tube Q breakfast   midodrine  10 mg Per Tube TID WC   nystatin  5 mL Per Tube QID   pantoprazole sodium  40 mg Per Tube Daily   risperiDONE  3 mg Per Tube QHS   sodium chloride flush  10-40 mL Intracatheter Q12H   sodium chloride flush  10-40 mL Intracatheter Q12H   sodium chloride flush  3 mL Intravenous Q12H   valproic acid  500 mg Per Tube BID   vancomycin variable dose per unstable renal function (pharmacist dosing)   Does not apply See admin instructions    Dialysis Orders:  [place] [phone] (HT not open on weekends)  OP Nephrologist: Dimas Chyle  Ambulatory PD  7x/week  4 exchanges 2L    Assessment/Plan: Sepsis - due to asp PNA and PD cath related peritonitis Aspiration PNA - +bld cx thought to be contaminant.   ID signed off. IV unasyn for asp PNA. Diarrhea/ vomiting - rectal tube in place, Cdif Ag+, toxin negative. Conservative Rx.  Malnutrition - dysphagia diet rec'd by nutrition. Alb < 1.5. TPN being considered.  MRSE PD cath related acute  peritonitis - f/u cell count showed sig improvement on 11/8. No e/o tunnel infection. Cont Vancomycin (IV here, IP when dc'd) for 2 wks total thru 11/18 per ID.  Hypotension/volume - sp pressor support, is now getting midodrine 10 tid. BPs are low- normal now.  ESRD - will continue CCPD nightly, no edema on exam today. Will use combo 2.5% and 4.25% given now on TPN. May have to go on iHD while getting TPN if volume becomes a problem.  Anemia  - Hgb 8s > 9s> 10s > 9s.  Consider esa if falls lower.  Metabolic bone disease -  Corr Ca/Phos ok.  Nutrition - Alb low.  Prot supp when taking PO Mental retardation Seizure d/o Schizoaffective/Bipolar disorder - per primary  Hypokalemia - requiring ongoing repletion PRN.  Prognosis - overall poor prognosis. Would consider GOC w/ palliative team involvement   08/31/21 - Dr Johnney Ou spoke to RN at her dialysis clinic - has 2 family members (sister and BIL) who do her PD  treatments for her and have been providing essentially total care to her recently.  Her BP chronically runs on the low side.  Her current clinical status doesn't seem far off her baseline actually, so it's reasonable to continue her PD.  Spoke to [employer]  her sister who agrees she and husband can continue the current level of care for Rylan and would prefer not to switch to HD.  Ongoing discussion as course progresses.    Kelly Splinter, MD 09/06/2021, 12:52 PM

## 2021-09-06 NOTE — Progress Notes (Addendum)
PHARMACY - TOTAL PARENTERAL NUTRITION CONSULT NOTE   Indication: Prolonged ileus and Intolerance to enteral feeding   Patient Measurements: Height: 5\' 4"  (162.6 cm) Weight: 66.5 kg (146 lb 9.7 oz) IBW/kg (Calculated) : 54.7 TPN AdjBW (KG): 62.5 Body mass index is 25.16 kg/m. Admit weight 62.5 kg  Assessment: 55 years of age female who presented 08/26/21 to the ED with 1 week history of nausea, vomiting, and diarrhea. PMH most notable for ESRD on PD at home and history of discitis/osteomyelitis with epidural abscess in April 2022. Patient was admitted with sepsis, aspiration pneumonia, and peritonitis and transferred to ICU on 11/5 for septic shock. Septic shock resolved, transferred to progressive level of care, and patient receiving antibiotic treatment for MRSA bacteremia and peritonitis. Enteral nutrition in form of tube feeds via Cortrak initiated on 08/31/21 at trickle rate. Patient had vomiting 11/20 and tube feeds were held with KUB showing ileus. Reglan was initiated and tube feeds re-attempted at trickle rate however patient subsequently had projectile vomiting on 11/13 and tube feeds were held again. Now 11 days post admission and pharmacy consulted to initiate TPN for chronic malnutrition with persistent ileus and inability to tolerate enteral nutrition.   Glucose / Insulin: A1c (April) 5.6. CBGs 106-140 after addition of TPN- no insulin required.  Electrolytes: Na/Cl low and trend down (128/92), CO2 low 21, K 4.2, Magnesium low at 1.9. Phosphorus/Calcium within normal limits.  Renal: CCPD nightly continued. BUN 43.  Hepatic: LFTs low-normal. Tbili within normal limits. Triglycerides 161. Intake / Output; MIVF: + upper and lower non-pitting edema, orbital edema; stool x1 recorded 11/14. No maintenance IVF. GI Imaging:  CT Abd 11/5 - ileus Abd Xray 11/13 - slight decrease in gaseous distention, suggestive of possibly improving ileus GI Surgeries / Procedures: none  Central access:  Double lumen PICC (11/14) TPN start date: 09/06/21  Nutritional Goals: Goal TPN rate is 58 mL/hr (provides 90 g of protein and 1911 kcals per day)  RD Assessment: Estimated Needs Total Energy Estimated Needs: 1900-2100 Total Protein Estimated Needs: 90-110 grams Total Fluid Estimated Needs: 1000 ml + UOP  Current Nutrition:  Tube feeding via Cortrak - currently going at 30 ml/hr with plan for increase 40 ml/hr today (goal 50) Dysphagia 2 Diet - No oral intake per discussion with RN  Plan:  Continue Concentrated TPN at half rate of 30 mL/hr at 1800 - Keeping at half rate after discussion with RD and Primary MD as attempting to increase enteral nutrition Electrolytes in TPN: Na 62mEq/L, K 64mEq/L, Ca 39mEq/L, Mg 30mEq/L, and Phos 58mmol/L. Cl:Ac 1:2 (based on constituents and what is allowable) Add standard MVI and trace elements to TPN -Remove chromium with ESRD on CCPD Discontinue Very Sensitive q4h SSI but continue to monitor CBGs q6h  Monitor fluid status Monitor TPN labs on Mon/Thurs; plan for CMP, Magnesium, and Phosphorus  in AM Monitor enteral nutrition plans and ability to increase tube feeds or oral intake   Sloan Leiter, PharmD, BCPS, BCCCP Clinical Pharmacist Please refer to [facility] for [place] numbers 09/06/2021,7:34 AM

## 2021-09-06 NOTE — Progress Notes (Addendum)
PROGRESS NOTE    Jane [name]  IPJ:[id number] DOB: Oct 20, 1966 DOA: 08/26/2021 PCP: [name], MD   Brief Narrative/Hospital Course:  Jane [name], 55 y.o. female with past medical history of end-stage renal disease on peritoneal dialysis, schizoaffective disorder, history of osteomyelitis, epidural abscess presented to hospital with nausea, vomiting, diarrhea, abdominal pain and cough.  At baseline, patient is wheelchair-bound and has been dealing with disabling psych issues.  In the ED, patient had hypotension, acute encephalopathy and was admitted hospital for septic shock, aspiration pneumonia, acute metabolic encephalopathy initially to the ICU.  Patient was seen by nephrology and ID.  CT scan of the abdomen pelvis showed pneumonia and small ascites.  Patient was treated for spontaneous bacterial peritonitis secondary to MRSE from PD catheter and aspiration pneumonia.  Patient has been on NG tube feeding since that she has not been taking orally.  Patient also has rectal tube in place.  Assessment & Plan:  Septic shock due to spontaneous bacterial peritonitis secondary to peritoneal dialysis catheter associated infection/Aspiration pneumonia,  ID on board.  Plan is to continue 2 weeks of IV vancomycin. On nystatin p.o. 4 times daily for fungal peritonitis prophylaxis.  On midodrine as well.   Poor PEG tube tolerance.  Dietitian  recommended PICC line placement with TPN.  Abdominal x-ray shows improving ileus.  Reglan on hold on hold that was started on 09/01/21  to excessive diarrhea.  Hold off with Imodium..  We will get x-ray KUB in AM.  ESRD on PD:  On nephrology during hospitalization for dialysis.  Hypokalemia  Improved after replacement.  Potassium 4.5  Hyponatremia: Mild hyponatremia.  At this time sodium level of 127  Hypoglycemia:  Latest POC glucose of 83.  We will continue to monitor.  Patient will be started on TPN as well.   Sinus tachycardia/borderline  hypotension. Continue midodrine.  Secondary to ongoing diarrhea and peritoneal dialysis.  Midodrine dose was increased from 5 to 10mg .  We will continue to monitor closely.  Diarrhea with C. difficile antigen positive toxin negative, indeterminate results.   ID did not recommend treatment for C. difficile.  Supportive care if needed but due to ileus we will hold off with antimotility agents.  MRSE and GPR positive in 1/2 sets felt to be contaminant  Acute metabolic encephalopathy: Patient more alert and awake today.  Multiple cognitive and psychiatric comorbidities, acute illness.      Schizoaffective disorder/bipolar disorder/Multiple cognitive and psychiatric comorbidities: Patient is on Cogentin, fluoxetine, Risperdal, Keppra at home. Cont supportive care more alert awake and mildly communicative.  Tearful at times.  Hypothyroidism: Continue Synthroid   History of discitis/epidural abscess  Status postlaminectomy on 01/25/2021 with MSSA and completed 8 weeks of antibiotic.  Patient is wheelchair bound at this time.  Needing total assist and care.   Anemia- Likely multifactorial from chronic kidney disease.  Patient was FOBT positive on 11/ 5 but hemoglobin has been stable since then.   FOBT positive 11/5 although no evidence of active GI bleeding.  Latest hemoglobin of 8.9.  Moderate protein calorie malnutrition, present on admission Patient has been seen by nutritionist.  Was started on dysphagia diet as well as tube feeding.  Patient had PEG tube intolerance so PICC line has been placed and patient has been started on TPN.    Pressure injury of right sacrum stage II POA  continue local wound care  Pressure Injury 01/25/21 Sacrum Right;Left Stage 2 -  Partial thickness loss of dermis presenting as  a shallow open injury with a red, pink wound bed without slough. (Active)  01/25/21 1951  Location: Sacrum  Location Orientation: Right;Left  Staging: Stage 2 -  Partial thickness loss of  dermis presenting as a shallow open injury with a red, pink wound bed without slough. (old pressure injury that is healing)  Wound Description (Comments):   Present on Admission: Yes   DVT prophylaxis: heparin injection 5,000 Units Start: 08/30/21 1400    Code Status: Full Code  Family Communication:  Spoke with the patient's sister on the phone on 09/05/2021 Status is: Inpatient  Remains inpatient appropriate because: Ongoing management of sepsis, poor oral intake, ileus, tube feeding,  need for TPN.  Disposition. Likely home with family when medically stable.  Currently not medically stable  Subjective: Today, patient was seen and examined at bedside.  Appears to be more alert awake but tearful at times.  Status post PICC line placement.  Poor oral intake reported.  Off rectal tube since yesterday.  Objective:  Vitals:   09/06/21 0700 09/06/21 0735 09/06/21 0743 09/06/21 0800  BP: 101/74 109/79  122/70  Pulse: (!) 103 (!) 108  (!) 108  Resp:  (!) 21  20  Temp:  98.2 F (36.8 C) 98.2 F (36.8 C)   TempSrc:  Oral Oral   SpO2:  95%  95%  Weight:      Height:       Weight change: 3.1 kg  Intake/Output Summary (Last 24 hours) at 09/06/2021 1047 Last data filed at 09/06/2021 7412 Gross per 24 hour  Intake 850 ml  Output 8007 ml  Net -7157 ml    Net IO Since Admission: -4,633.14 mL [09/06/21 1047]    Physical Examination: General:  Average built, not in obvious distress feels tearful, frail-appearing, older than stated age HENT:   No scleral pallor or icterus noted. Oral mucosa is moist.  Cortrak tube tube in place. Chest:  Clear breath sounds.  Diminished breath sounds bilaterally. No crackles or wheezes.  CVS: S1 &S2 heard. No murmur.  Regular rate and rhythm. Abdomen: Soft, nontender, peritoneal dialysis catheter in place, nondistended.  Bowel sounds are heard.   Extremities: No cyanosis, clubbing or edema.  Peripheral pulses are palpable. Psych: Alert, awake,  tearful, answering few questions. CNS:  No cranial nerve deficits.  Moves extremities. Skin: Warm and dry.  No rashes noted.   Medications reviewed:  Scheduled Meds:  benztropine  1 mg Per Tube QHS   Chlorhexidine Gluconate Cloth  6 each Topical Daily   FLUoxetine  20 mg Per Tube Daily   gentamicin cream  1 application Topical Daily   heparin injection (subcutaneous)  5,000 Units Subcutaneous Q8H   insulin aspart  0-6 Units Subcutaneous Q4H   levETIRAcetam  500 mg Per Tube BID   levothyroxine  50 mcg Per Tube Q breakfast   midodrine  10 mg Per Tube TID WC   nystatin  5 mL Per Tube QID   pantoprazole sodium  40 mg Per Tube Daily   risperiDONE  3 mg Per Tube QHS   sodium chloride flush  10-40 mL Intracatheter Q12H   sodium chloride flush  10-40 mL Intracatheter Q12H   sodium chloride flush  3 mL Intravenous Q12H   valproic acid  500 mg Per Tube BID   vancomycin variable dose per unstable renal function (pharmacist dosing)   Does not apply See admin instructions   Continuous Infusions:  sodium chloride Stopped (08/31/21 1917)   dialysis solution  1.5% low-MG/low-CA     dialysis solution 2.5% low-MG/low-CA     feeding supplement (VITAL 1.5 CAL) 1,000 mL (09/05/21 1550)   TPN ADULT (ION)     Diet Order             DIET DYS 2 Room service appropriate? Yes; Fluid consistency: Thin  Diet effective now                   Nutrition Problem: Moderate Malnutrition Etiology: chronic illness (ESRD, CHF) Signs/Symptoms: mild fat depletion, moderate muscle depletion Interventions: Tube feeding, TPN  Weight change: 3.1 kg  Wt Readings from Last 3 Encounters:  09/06/21 66.5 kg  06/20/21 58.9 kg  02/04/21 58.9 kg     Consultants: Nephrology,  infectious disease  Procedures: Cortrak tube tube placement Peritoneal dialysis  Antimicrobials: Anti-infectives (From admission, onward)    Start     Dose/Rate Route Frequency Ordered Stop   09/03/21 1500  vancomycin (VANCOREADY)  IVPB 1000 mg/200 mL        1,000 mg 200 mL/hr over 60 Minutes Intravenous  Once 09/02/21 1534 09/04/21 0816   09/02/21 2008  vancomycin variable dose per unstable renal function (pharmacist dosing)         Does not apply See admin instructions 09/02/21 2008     08/30/21 1100  vancomycin (VANCOREADY) IVPB 1000 mg/200 mL  Status:  Discontinued        1,000 mg 200 mL/hr over 60 Minutes Intravenous every 72 hours 08/30/21 0928 09/02/21 1534   08/29/21 2200  Ampicillin-Sulbactam (UNASYN) 3 g in sodium chloride 0.9 % 100 mL IVPB        3 g 200 mL/hr over 30 Minutes Intravenous Every 24 hours 08/29/21 1239 09/02/21 2255   08/28/21 2200  ceFEPIme (MAXIPIME) 2 g in sodium chloride 0.9 % 100 mL IVPB  Status:  Discontinued        2 g 200 mL/hr over 30 Minutes Intravenous Every 48 hours 08/27/21 0348 08/29/21 1239   08/28/21 0051  vancomycin variable dose per unstable renal function (pharmacist dosing)  Status:  Discontinued         Does not apply See admin instructions 08/28/21 0052 08/30/21 0936   08/27/21 0330  metroNIDAZOLE (FLAGYL) IVPB 500 mg  Status:  Discontinued        500 mg 100 mL/hr over 60 Minutes Intravenous Every 12 hours 08/27/21 0328 08/29/21 1239   08/27/21 0315  vancomycin (VANCOREADY) IVPB 1500 mg/300 mL        1,500 mg 150 mL/hr over 120 Minutes Intravenous  Once 08/27/21 0303 08/27/21 0654   08/27/21 0215  ceFEPIme (MAXIPIME) 2 g in sodium chloride 0.9 % 100 mL IVPB        2 g 200 mL/hr over 30 Minutes Intravenous  Once 08/27/21 0202 08/27/21 0327      Culture/Microbiology    Component Value Date/Time   SDES BLOOD RIGHT HAND 08/29/2021 1233   SPECREQUEST  08/29/2021 1233    BOTTLES DRAWN AEROBIC AND ANAEROBIC Blood Culture adequate volume   CULT  08/29/2021 1233    NO GROWTH 5 DAYS Performed at [facility], [place] [address] [place], [place] [address]    REPTSTATUS 09/03/2021 FINAL 08/29/2021 1233      Data Reviewed: I have personally reviewed the following  labs and imaging studies.    CBC: Recent Labs  Lab 09/02/21 0536 09/03/21 0153 09/04/21 0210 09/05/21 0406 09/06/21 0315  WBC 5.0 5.0 6.2 6.6 6.7  HGB 9.7* 9.3* 9.4* 9.1* 8.9*  HCT 30.1* 28.4* 28.6* 27.9* 27.2*  MCV 85.8 86.1 86.9 86.9 86.1  PLT 128* 132* 117* 120* 136*    Basic Metabolic Panel: Recent Labs  Lab 09/01/21 0637 09/01/21 1546 09/02/21 0534 09/03/21 0153 09/04/21 0210 09/05/21 0406 09/06/21 0315  NA 128*  --  130* 128* 128* 129* 127*  K 4.3  --  3.3* 3.4* 4.1 4.5 4.6  CL 97*  --  96* 95* 94* 95* 90*  CO2 19*  --  20* 19* 20* 21* 21*  GLUCOSE 61*  --  82 119* 104* 93 98  BUN 35*  --  34* 32* 34* 38* 46*  CREATININE 4.13*  --  4.13* 4.22* 4.16* 4.21* 4.32*  CALCIUM 8.8*  --  8.7* 8.9 9.0 9.4 9.3  MG 2.0 2.0  --  1.7  --  1.7 1.7  PHOS 4.3 4.6 4.2 3.7  --  3.7 4.1    GFR: Estimated Creatinine Clearance: 14 mL/min (A) (by C-G formula based on SCr of 4.32 mg/dL (H)). Liver Function Tests: Recent Labs  Lab 09/02/21 0534 09/03/21 0153 09/05/21 0406 09/06/21 0315  AST  --   --  8* 14*  ALT  --   --  9 6  ALKPHOS  --   --  153* 160*  BILITOT  --   --  0.2* 0.6  PROT  --   --  5.0* 5.2*  ALBUMIN <1.5* <1.5* <1.5* <1.5*    No results for input(s): LIPASE, AMYLASE in the last 168 hours.  No results for input(s): AMMONIA in the last 168 hours. Coagulation Profile: No results for input(s): INR, PROTIME in the last 168 hours. Cardiac Enzymes: No results for input(s): CKTOTAL, CKMB, CKMBINDEX, TROPONINI in the last 168 hours. BNP (last 3 results) No results for input(s): PROBNP in the last 8760 hours. HbA1C: No results for input(s): HGBA1C in the last 72 hours. CBG: Recent Labs  Lab 09/05/21 1605 09/05/21 1923 09/05/21 2320 09/06/21 0346 09/06/21 0739  GLUCAP 78 80 94 97 83    Lipid Profile: Recent Labs    09/06/21 0315  TRIG 161*   Thyroid Function Tests: No results for input(s): TSH, T4TOTAL, FREET4, T3FREE, THYROIDAB in the last 72  hours. Anemia Panel: No results for input(s): VITAMINB12, FOLATE, FERRITIN, TIBC, IRON, RETICCTPCT in the last 72 hours.  Sepsis Labs: No results for input(s): PROCALCITON, LATICACIDVEN in the last 168 hours.   Recent Results (from the past 240 hour(s))  Body fluid culture w Gram Stain     Status: None   Collection Time: 08/27/21  2:16 PM   Specimen: Peritoneal Washings; Body Fluid  Result Value Ref Range Status   Specimen Description PERITONEAL FLUID  Final   Special Requests NONE  Final   Gram Stain   Final    WBC PRESENT,BOTH PMN AND MONONUCLEAR GRAM POSITIVE COCCI CYTOSPIN SMEAR Performed at [facility], [address], [place], [place] [address]    Culture ABUNDANT STAPHYLOCOCCUS EPIDERMIDIS  Final   Report Status 08/30/2021 FINAL  Final   Organism ID, Bacteria STAPHYLOCOCCUS EPIDERMIDIS  Final      Susceptibility   Staphylococcus epidermidis - MIC*    CIPROFLOXACIN 2 INTERMEDIATE Intermediate     ERYTHROMYCIN <=0.25 SENSITIVE Sensitive     GENTAMICIN 4 SENSITIVE Sensitive     OXACILLIN >=4 RESISTANT Resistant     TETRACYCLINE <=1 SENSITIVE Sensitive     VANCOMYCIN 1 SENSITIVE Sensitive  TRIMETH/SULFA 20 SENSITIVE Sensitive     CLINDAMYCIN <=0.25 SENSITIVE Sensitive     RIFAMPIN <=0.5 SENSITIVE Sensitive     Inducible Clindamycin NEGATIVE Sensitive     * ABUNDANT STAPHYLOCOCCUS EPIDERMIDIS  C Difficile Quick Screen w PCR reflex     Status: Abnormal   Collection Time: 08/28/21 10:25 AM   Specimen: STOOL  Result Value Ref Range Status   C Diff antigen POSITIVE (A) NEGATIVE Final    Comment: RESULTS REPORTED TO RN BEABRAUT 08/28/21 AT 1120 BY NM   C Diff toxin NEGATIVE NEGATIVE Final   C Diff interpretation Results are indeterminate. See PCR results.  Final    Comment: Performed at [facility], [place] [address], [place], [place] [address]  C. Diff by PCR, Reflexed     Status: Abnormal   Collection Time: 08/28/21 10:25 AM  Result Value Ref Range Status    Toxigenic C. Difficile by PCR POSITIVE (A) NEGATIVE Final    Comment: Positive for toxigenic C. difficile with little to no toxin production. Only treat if clinical presentation suggests symptomatic illness. Performed at [facility], [place] [address] [place], [place] [address]   Culture, blood (routine x 2)     Status: None   Collection Time: 08/29/21 12:24 PM   Specimen: BLOOD LEFT HAND  Result Value Ref Range Status   Specimen Description BLOOD LEFT HAND  Final   Special Requests   Final    BOTTLES DRAWN AEROBIC AND ANAEROBIC Blood Culture adequate volume   Culture   Final    NO GROWTH 5 DAYS Performed at [facility], [place] [address], [place], [place] [address]    Report Status 09/03/2021 FINAL  Final  Culture, blood (routine x 2)     Status: None   Collection Time: 08/29/21 12:33 PM   Specimen: BLOOD RIGHT HAND  Result Value Ref Range Status   Specimen Description BLOOD RIGHT HAND  Final   Special Requests   Final    BOTTLES DRAWN AEROBIC AND ANAEROBIC Blood Culture adequate volume   Culture   Final    NO GROWTH 5 DAYS Performed at [facility], [place] [address], [place], [place] [address]    Report Status 09/03/2021 FINAL  Final     Radiology Studies: DG Abd 1 View  Result Date: 09/04/2021 CLINICAL DATA:  Abdominal distension.  Evaluate ileus. EXAM: ABDOMEN - 1 VIEW COMPARISON:  Radiograph 09/01/2021.  CT 08/27/2021 FINDINGS: Stable positioning of weighted enteric tube with tip in the right upper quadrant. Gaseous distention of central small bowel measuring up to 3.4 cm, slightly decreased gaseous distension from prior exam. There is air within the transverse colon. No formed stool is seen. Peritoneal dialysis catheter in place. IMPRESSION: Slight decrease in gaseous distention of central small bowel, suggesting persistent possibly improving ileus. Air is seen in the transverse colon. Electronically Signed   By: Keith Rake M.D.   On: 09/04/2021 15:03    [place] EKG SITE RITE  Result Date: 09/05/2021 If Site Rite image not attached, placement could not be confirmed due to current cardiac rhythm.    LOS: 10 days   Flora Lipps, MD [facility] 09/06/2021, 10:47 AM

## 2021-09-06 NOTE — Progress Notes (Signed)
Pharmacy Antibiotic Note  Jane [name] is a 55 y.o. female admitted on 08/26/2021 with pneumonia.  Pharmacy has been consulted for vancomycin dosing. Patient is on continuous PD with 4 exchanges of 2 L and tolerating well. Blood and peritoneal fluid cultures positive for MRSE.   Vancomycin random this AM is elevated at 34 ug/mL (above range of 15-20 ug/mL after dose of Vancomycin 1 gram IV given 72 hours ago. Based on her kinetics, her dosing interval should be extended to ~4 days since her level was elevated and we are waiting for it to drop below 20 to redose (accumulation supports this), therefore likely will have enough Vancomycin on board to complete course of 2 weeks come this Friday.   Plan: No further Vancomycin at this time due to elevated level.  Follow-up vanc level as needed -- wil plan to repeat level on Thursday and re-dose at that time conservatively if <20 noting course to complete Friday Monitor clinical improvement, renal function, and f/u cultures.  Height: 5\' 4"  (162.6 cm) Weight: 66.5 kg (146 lb 9.7 oz) IBW/kg (Calculated) : 54.7  Temp (24hrs), Avg:98 F (36.7 C), Min:97.6 F (36.4 C), Max:98.6 F (37 C)  Recent Labs  Lab 09/02/21 0534 09/02/21 0536 09/02/21 0839 09/03/21 0153 09/04/21 0210 09/05/21 0406 09/06/21 0315  WBC  --  5.0  --  5.0 6.2 6.6 6.7  CREATININE 4.13*  --   --  4.22* 4.16* 4.21* 4.32*  VANCORANDOM  --   --  26  --   --   --  34    Estimated Creatinine Clearance: 14 mL/min (A) (by C-G formula based on SCr of 4.32 mg/dL (H)).    Allergies  Allergen Reactions   Icodextrin Rash    Antimicrobials this admission: Cefepime 11/5>>11/7 Vanco 11/5>>  Metronid 11/5>>11/7 Unasyn 11/7>> 11/11  Dose adjustments this admission: 11/5 1250 mg IV 11/8 Vancomycin random level = 19 > gave 1 g IV 11/11 Vancomycin random level = 26 11/12 gave 1 g IV 11/15 Vancomycin random level - 34   Microbiology results: 11/5 Bcx: MRSE 11/5 Pleural  fluid: MRSE 11/5 salmonella not detected   Thank you for allowing pharmacy to be a part of this patient's care.  Sloan Leiter, PharmD, BCPS, BCCCP Clinical Pharmacist Please refer to [facility] for [place] numbers 09/06/2021, 8:33 AM

## 2021-09-07 ENCOUNTER — Inpatient Hospital Stay (HOSPITAL_COMMUNITY): Payer: Medicare Other

## 2021-09-07 LAB — PHOSPHORUS: Phosphorus: 3.1 mg/dL (ref 2.5–4.6)

## 2021-09-07 LAB — COMPREHENSIVE METABOLIC PANEL
ALT: 9 U/L (ref 0–44)
AST: 12 U/L — ABNORMAL LOW (ref 15–41)
Albumin: <1.5 g/dL — ABNORMAL LOW (ref 3.5–5.0)
Alkaline Phosphatase: 174 U/L — ABNORMAL HIGH (ref 38–126)
Anion gap: 12 (ref 5–15)
BUN: 43 mg/dL — ABNORMAL HIGH (ref 6–20)
CO2: 24 mmol/L (ref 22–32)
Calcium: 9.1 mg/dL (ref 8.9–10.3)
Chloride: 92 mmol/L — ABNORMAL LOW (ref 98–111)
Creatinine, Ser: 4.37 mg/dL — ABNORMAL HIGH (ref 0.44–1.00)
GFR, Estimated: 11 mL/min — ABNORMAL LOW (ref >60)
Glucose, Bld: 140 mg/dL — ABNORMAL HIGH (ref 70–99)
Potassium: 4.2 mmol/L (ref 3.5–5.1)
Sodium: 128 mmol/L — ABNORMAL LOW (ref 135–145)
Total Bilirubin: 0.6 mg/dL (ref 0.3–1.2)
Total Protein: 5.2 g/dL — ABNORMAL LOW (ref 6.5–8.1)

## 2021-09-07 LAB — GLUCOSE, CAPILLARY
Glucose-Capillary: 128 mg/dL — ABNORMAL HIGH (ref 70–99)
Glucose-Capillary: 106 mg/dL — ABNORMAL HIGH (ref 70–99)
Glucose-Capillary: 101 mg/dL — ABNORMAL HIGH (ref 70–99)
Glucose-Capillary: 96 mg/dL (ref 70–99)

## 2021-09-07 LAB — MAGNESIUM: Magnesium: 1.9 mg/dL (ref 1.7–2.4)

## 2021-09-07 MED ORDER — TRACE MINERALS CU-MN-SE-ZN 300-55-60-3000 MCG/ML IV SOLN
INTRAVENOUS | Status: AC
  Filled 2021-09-07: qty 312

## 2021-09-07 MED ORDER — DELFLEX-LC/4.25% DEXTROSE 483 MOSM/L IP SOLN
INTRAPERITONEAL | Status: DC
  Administered 2021-09-07: 5000 mL via INTRAPERITONEAL

## 2021-09-07 MED ORDER — DELFLEX-LC/2.5% DEXTROSE 394 MOSM/L IP SOLN
INTRAPERITONEAL | Status: DC
  Administered 2021-09-07: 5000 mL via INTRAPERITONEAL

## 2021-09-07 MED ORDER — VITAL 1.5 CAL PO LIQD
1000.0000 mL | ORAL | Status: DC
  Administered 2021-09-07 (×2): 1000 mL
  Filled 2021-09-07 (×2): qty 1000

## 2021-09-07 NOTE — Progress Notes (Signed)
[facility] Progress Note   Subjective:  seen in room, not responsive verbally  Objective Vitals:   09/07/21 1000 09/07/21 1100 09/07/21 1135 09/07/21 1200  BP:  120/71  132/72  Pulse: (!) 124 (!) 110  (!) 113  Resp:    18  Temp:   98 F (36.7 C)   TempSrc:   Oral   SpO2:    98%  Weight:      Height:         Additional Objective Labs: Basic Metabolic Panel: Recent Labs  Lab 09/05/21 0406 09/06/21 0315 09/07/21 0515  NA 129* 127* 128*  K 4.5 4.6 4.2  CL 95* 90* 92*  CO2 21* 21* 24  GLUCOSE 93 98 140*  BUN 38* 46* 43*  CREATININE 4.21* 4.32* 4.37*  CALCIUM 9.4 9.3 9.1  PHOS 3.7 4.1 3.1    CBC: Recent Labs  Lab 09/02/21 0536 09/03/21 0153 09/04/21 0210 09/05/21 0406 09/06/21 0315  WBC 5.0 5.0 6.2 6.6 6.7  HGB 9.7* 9.3* 9.4* 9.1* 8.9*  HCT 30.1* 28.4* 28.6* 27.9* 27.2*  MCV 85.8 86.1 86.9 86.9 86.1  PLT 128* 132* 117* 120* 136*        Physical Exam General: sleeping, up in bed, cortrak in place Heart: RRR No m,r,g Lungs: Clear bilaterally  Abdomen: soft, nontender today Extremities: no sig LE edema Dialysis Access: PD cath in place   Medications:  sodium chloride Stopped (08/31/21 1917)   dialysis solution 2.5% low-MG/low-CA     dialysis solution 4.25% low-MG/low-CA     feeding supplement (VITAL 1.5 CAL) 1,000 mL (09/07/21 1012)   TPN ADULT (ION) 30 mL/hr at 09/07/21 1000   TPN ADULT (ION)      benztropine  1 mg Per Tube QHS   Chlorhexidine Gluconate Cloth  6 each Topical Daily   darbepoetin (ARANESP) injection - DIALYSIS  40 mcg Subcutaneous Q Tue-1800   FLUoxetine  20 mg Per Tube Daily   gentamicin cream  1 application Topical Daily   heparin injection (subcutaneous)  5,000 Units Subcutaneous Q8H   levETIRAcetam  500 mg Per Tube BID   levothyroxine  50 mcg Per Tube Q breakfast   midodrine  10 mg Per Tube TID WC   nystatin  5 mL Per Tube QID   pantoprazole sodium  40 mg Per Tube Daily   risperiDONE  3 mg Per Tube QHS    sodium chloride flush  10-40 mL Intracatheter Q12H   sodium chloride flush  10-40 mL Intracatheter Q12H   sodium chloride flush  3 mL Intravenous Q12H   valproic acid  500 mg Per Tube BID   vancomycin variable dose per unstable renal function (pharmacist dosing)   Does not apply See admin instructions    Dialysis Orders:  [facility] [phone] (HT not open on weekends)  OP Nephrologist: Dimas Chyle  Ambulatory PD  7x/week  4 exchanges 2L    Assessment/Plan: Sepsis - due to asp PNA and PD cath related peritonitis Aspiration PNA - +bld cx thought to be contaminant.   ID signed off. IV unasyn for asp PNA. Diarrhea/ vomiting - rectal tube in place, Cdif Ag+, toxin negative. Conservative Rx.  Malnutrition - dysphagia diet rec'd by nutrition. Alb < 1.5. TPN started 11/15, running at 50% of goal today , ~30 cc/hr.  MRSE PD cath related acute peritonitis - f/u cell count showed sig improvement on 11/8. No e/o tunnel infection. Cont Vancomycin for 2 wks total thru 11/18  per ID.  Hypotension/volume - sp pressor support, is now getting midodrine 10 tid. BPs are low- normal now.  ESRD - will continue CCPD nightly, no edema on exam today. Will use combo 2.5% and 4.25% given now on TPN. Got off 1.5 L overnight last night which is very good. Wt's are stable. Does have LE dependent edema which is 1+.  Anemia  - Hgb 8s > 9s> 10s > 9s.  Started esa 11/15 w/ darbe 40 ug sq every Tuesday.  Metabolic bone disease -  Corr Ca/Phos ok.  Nutrition - Alb low.  Prot supp when taking PO Mental retardation Seizure d/o Schizoaffective/Bipolar disorder - per primary  Hypokalemia - requiring ongoing repletion PRN.  Prognosis - overall poor prognosis. Would consider GOC w/ palliative team involvement     Kelly Splinter, MD 09/07/2021, 12:47 PM  08/31/21 - Dr Johnney Ou spoke to RN at her dialysis clinic - has 2 family members (sister and BIL) who do her PD treatments for her and have been providing  essentially total care to her recently.  Her BP chronically runs on the low side.  Her current clinical status doesn't seem far off her baseline actually, so it's reasonable to continue her PD.  Spoke to [employer]  her sister who agrees she and husband can continue the current level of care for [place] and would prefer not to switch to HD.  Ongoing discussion as course progresses.

## 2021-09-07 NOTE — Progress Notes (Signed)
Nutrition Follow-up  DOCUMENTATION CODES:   Non-severe (moderate) malnutrition in context of chronic illness  INTERVENTION:   - Continue TPN dosing per Pharmacy, recommend leaving at half rate (30 ml/hr) today  Tube feeds via Cortrak tube: - Increase Vital 1.5 to 40 ml/hr (960 ml/day)  This tube feeding regimen provides 1440 kcal, 65 grams of protein, and 733 ml of H2O.    RD will continue to monitor for improvement in ileus, tube feeding tolerance at higher rate, and ability to continue to advance tube feeds to goal regimen: - Vital 1.5 @ 55 ml/hr (1320 ml/day) - ProSource TF 45 ml BID   Tube feeding regimen at goal regimen provides 2060 kcal, 111 grams of protein, and 1008 ml of H2O.   - Continue dysphagia 2 diet with thin liquids as tolerated  - Encourage PO intake  NUTRITION DIAGNOSIS:   Moderate Malnutrition related to chronic illness (ESRD, CHF) as evidenced by mild fat depletion, moderate muscle depletion.  Ongoing, being addressed via nutrition support  GOAL:   Patient will meet greater than or equal to 90% of their needs  Progressing  MONITOR:   PO intake, Labs, Weight trends, TF tolerance, Skin, I & O's, Other (TPN)  REASON FOR ASSESSMENT:   Consult Enteral/tube feeding initiation and management  ASSESSMENT:   55 year old female who presented to the ED on 11/04 with N/V/D x 1 week. PMH of ESRD on PD at home, HTN, HLD, schizophrenia, seizures, CHF, hypothyroidism, discitis/osteomyelitis and epidural abscess in April 2022. Pt admitted with sepsis, aspiration pneumonia, spontaneous bacterial peritonitis.  11/05 - transferred to ICU 11/06 - NPO 11/07 - diet advanced to dysphagia 2 with thin liquids 11/09 - Cortrak placed (tip gastric), tube feeds started at trickle rate 11/10 - pt with vomiting in early AM, tube feeds held, KUB showing ileus, reglan started, tube feeds restarted at trickle rate 11/13 - projectile vomiting, tube feeds held 11/14 - PICC  placed 11/15 - TPN start  Discussed pt with RN and during ICU rounds. Discussed pt with MD and with Pharmacy. Abdominal x-ray this morning shows "similar to slightly improved" diffuse gaseous distension of the small bowel. MD okay with advancing tube feeds from 30 ml/hr to 40 ml/hr. Per RN, pt with no N/V at this time and was tolerating tube feeds at 30 ml/hr without issue. Pt having bowel movements. Pharmacy to keep TPN infusing at 30 ml/hr which meets ~50% of kcal and protein needs. Will continue to monitor for ability to advance tube feeds further to goal.  Pt with mild pitting generalized edema, non-pitting edema to BLE. Nephrology monitor for need to transition from CCPD to HD if volume is too much.  Admit weight: 62.5 kg Current weight: 63.8  Medications reviewed and include: aranesp weekly, nystatin, protonix, TPN  Labs reviewed: sodium 128, albumin <1.5, TG 161, hemoglobin 8.9 CBG's: 72-136 x 24 hours  CCPD total output for exchanges: 8007 ml I/O's: -3.4 L since admit  Diet Order:   Diet Order             DIET DYS 2 Room service appropriate? Yes; Fluid consistency: Thin  Diet effective now                   EDUCATION NEEDS:   Not appropriate for education at this time  Skin:  Skin Assessment: Skin Integrity Issues: Other: MASD to groin and buttocks, wound to R heel  Last BM:  09/07/21 type 6 x 1, type 7 x 1  Height:   Ht Readings from Last 1 Encounters:  08/28/21 5\' 4"  (1.626 m)    Weight:   Wt Readings from Last 1 Encounters:  09/07/21 63.8 kg    BMI:  Body mass index is 24.14 kg/m.  Estimated Nutritional Needs:   Kcal:  1900-2100  Protein:  90-110 grams  Fluid:  1000 ml + UOP    Gustavus Bryant, MS, RD, LDN Inpatient Clinical Dietitian Please see AMiON for contact information.

## 2021-09-07 NOTE — Progress Notes (Signed)
PROGRESS NOTE    Jane [name]  RCB:[id number] DOB: [birth year] DOA: 08/26/2021 PCP: [name], MD   Brief Narrative/Hospital Course:  [name], 55 y.o. female with past medical history of end-stage renal disease on peritoneal dialysis, schizoaffective disorder, history of osteomyelitis, epidural abscess presented to hospital with nausea, vomiting, diarrhea, abdominal pain and cough.  At baseline, patient is wheelchair-bound and has been dealing with disabling psych issues.  In the ED, patient had hypotension, acute encephalopathy and was admitted hospital for septic shock, aspiration pneumonia, acute metabolic encephalopathy initially to the ICU.  Patient was seen by nephrology and ID.  CT scan of the abdomen pelvis showed pneumonia and small ascites.  Patient was treated for spontaneous bacterial peritonitis secondary to MRSE from PD catheter and aspiration pneumonia.  Patient has been on NG tube feeding since that she has not been taking orally.  Patient also has rectal tube in place.  Assessment & Plan:  Septic shock due to spontaneous bacterial peritonitis secondary to peritoneal dialysis catheter associated infection/Aspiration pneumonia,  ID has seen the patient is plan is to continue 2 weeks of IV vancomycin. On nystatin p.o. 4 times daily for fungal peritonitis prophylaxis.  On midodrine as well.   Poor PEG tube tolerance.   On  TPN.  Abdominal x-ray shows improving ileus.  Reglan on hold since 09/01/21 due to diarrhea.  Nutrition on board.  Plan is to decrease TPN and increase tube feeds today to see if the patient would tolerate.  ESRD on PD:  On nephrology during hospitalization for dialysis.  Hypokalemia  Improved after replacement.  Potassium 4.5  Hyponatremia: Mild hyponatremia.  At this time sodium level of 127  Hypoglycemia:  Latest POC glucose of 101.  On on TPN as well.   Sinus tachycardia/borderline hypotension. Continue midodrine.  Secondary to  ongoing diarrhea and peritoneal dialysis.  Midodrine dose was increased from 5 to 10mg .  We will continue to monitor closely.  Diarrhea with C. difficile antigen positive toxin negative, indeterminate results.   ID did not recommend treatment for C. difficile.  Supportive care if needed but due to ileus we will hold off with antimotility agents.  Diarrhea has improved at this time.  MRSE and GPR positive in 1/2 sets felt to be contaminant  Acute metabolic encephalopathy:Multiple cognitive and psychiatric comorbidities, at baseline.  Is more alert awake.  Schizoaffective disorder/bipolar disorder/Multiple cognitive and psychiatric comorbidities: Patient is on Cogentin, fluoxetine, Risperdal, Keppra at home. Cont supportive care more alert awake and mildly communicative.  Tearful at times.  Hypothyroidism: Continue Synthroid   History of discitis/epidural abscess  Status postlaminectomy on 01/25/2021 with MSSA and completed 8 weeks of antibiotic.  Patient is wheelchair bound at this time.  Needing total assist and care.   Anemia- Likely multifactorial from chronic kidney disease.  Patient was FOBT positive on 11/ 5 but hemoglobin has been stable since then.   FOBT positive 11/5 although no evidence of active GI bleeding.  Latest hemoglobin of 8.9.  Moderate protein calorie malnutrition, present on admission Patient has been seen by nutritionist.  Was started on dysphagia diet as well as tube feeding.  Patient had PEG tube intolerance so PICC line has been placed and patient has been started on TPN.  We will decrease TPN to half today and try to increase tube feeding again to see if the patient can tolerate.  Pressure injury of right sacrum stage II POA  continue local wound care  Pressure Injury 01/25/21 Sacrum  Right;Left Stage 2 -  Partial thickness loss of dermis presenting as a shallow open injury with a red, pink wound bed without slough. (Active)  01/25/21 1951  Location: Sacrum   Location Orientation: Right;Left  Staging: Stage 2 -  Partial thickness loss of dermis presenting as a shallow open injury with a red, pink wound bed without slough. (old pressure injury that is healing)  Wound Description (Comments):   Present on Admission: Yes   DVT prophylaxis: heparin injection 5,000 Units Start: 08/30/21 1400    Code Status: Full Code  Family Communication:  Spoke with the patient's sister on the phone on 09/05/2021  Status is: Inpatient  Remains inpatient appropriate because: Ongoing management of sepsis, poor oral intake, ileus, tube feeding,  need for TPN.  Disposition. Likely home with family when medically stable.  Currently not medically stable  Subjective: Today, patient was seen and examined at bedside.  Patient appears to be alert awake but tearful when asked.  Off rectal tube.  No mention of vomiting.  Objective:  Vitals:   09/07/21 0700 09/07/21 0741 09/07/21 0800 09/07/21 1135  BP: 104/69  122/73   Pulse: (!) 124  (!) 121   Resp:   (!) 32   Temp:  98.5 F (36.9 C)  98 F (36.7 C)  TempSrc:  Oral  Oral  SpO2:   94%   Weight:      Height:       Weight change: -4.1 kg  Intake/Output Summary (Last 24 hours) at 09/07/2021 1201 Last data filed at 09/07/2021 1012 Gross per 24 hour  Intake 1146.35 ml  Output --  Net 1146.35 ml    Net IO Since Admission: -3,336.79 mL [09/07/21 1201]    Physical Examination:  General:  Average built, not in obvious distress, patient appears frail and deconditioned, tearful at times  HENT:   No scleral pallor or icterus noted. Oral mucosa is moist.  Cortrak tube tube in place. Chest:  Clear breath sounds.  Diminished breath sounds bilaterally. No crackles or wheezes.  CVS: S1 &S2 heard. No murmur.  Regular rate and rhythm. Abdomen: Soft, nontender, peritoneal dialysis catheter in place.   Bowel sounds are heard.   Extremities: No cyanosis, clubbing or edema.  Peripheral pulses are palpable. Psych:  Alert, awake, tearful, answering few questions. CNS:  No cranial nerve deficits.  Moves all the extremities. Skin: Warm and dry.  No rashes noted.  Medications reviewed:  Scheduled Meds:  benztropine  1 mg Per Tube QHS   Chlorhexidine Gluconate Cloth  6 each Topical Daily   darbepoetin (ARANESP) injection - DIALYSIS  40 mcg Subcutaneous Q Tue-1800   FLUoxetine  20 mg Per Tube Daily   gentamicin cream  1 application Topical Daily   heparin injection (subcutaneous)  5,000 Units Subcutaneous Q8H   levETIRAcetam  500 mg Per Tube BID   levothyroxine  50 mcg Per Tube Q breakfast   midodrine  10 mg Per Tube TID WC   nystatin  5 mL Per Tube QID   pantoprazole sodium  40 mg Per Tube Daily   risperiDONE  3 mg Per Tube QHS   sodium chloride flush  10-40 mL Intracatheter Q12H   sodium chloride flush  10-40 mL Intracatheter Q12H   sodium chloride flush  3 mL Intravenous Q12H   valproic acid  500 mg Per Tube BID   vancomycin variable dose per unstable renal function (pharmacist dosing)   Does not apply See admin instructions   Continuous  Infusions:  sodium chloride Stopped (08/31/21 1917)   dialysis solution 2.5% low-MG/low-CA     dialysis solution 4.25% low-MG/low-CA     feeding supplement (VITAL 1.5 CAL) 1,000 mL (09/07/21 1012)   TPN ADULT (ION) 30 mL/hr at 09/07/21 1000   TPN ADULT (ION)     Consultants: Nephrology,  infectious disease  Procedures: Cortrak tube tube placement Peritoneal dialysis PICC line placement.  Antimicrobials: Anti-infectives (From admission, onward)    Start     Dose/Rate Route Frequency Ordered Stop   09/03/21 1500  vancomycin (VANCOREADY) IVPB 1000 mg/200 mL        1,000 mg 200 mL/hr over 60 Minutes Intravenous  Once 09/02/21 1534 09/04/21 0816   09/02/21 2008  vancomycin variable dose per unstable renal function (pharmacist dosing)         Does not apply See admin instructions 09/02/21 2008     08/30/21 1100  vancomycin (VANCOREADY) IVPB 1000 mg/200  mL  Status:  Discontinued        1,000 mg 200 mL/hr over 60 Minutes Intravenous every 72 hours 08/30/21 0928 09/02/21 1534   08/29/21 2200  Ampicillin-Sulbactam (UNASYN) 3 g in sodium chloride 0.9 % 100 mL IVPB        3 g 200 mL/hr over 30 Minutes Intravenous Every 24 hours 08/29/21 1239 09/02/21 2255   08/28/21 2200  ceFEPIme (MAXIPIME) 2 g in sodium chloride 0.9 % 100 mL IVPB  Status:  Discontinued        2 g 200 mL/hr over 30 Minutes Intravenous Every 48 hours 08/27/21 0348 08/29/21 1239   08/28/21 0051  vancomycin variable dose per unstable renal function (pharmacist dosing)  Status:  Discontinued         Does not apply See admin instructions 08/28/21 0052 08/30/21 0936   08/27/21 0330  metroNIDAZOLE (FLAGYL) IVPB 500 mg  Status:  Discontinued        500 mg 100 mL/hr over 60 Minutes Intravenous Every 12 hours 08/27/21 0328 08/29/21 1239   08/27/21 0315  vancomycin (VANCOREADY) IVPB 1500 mg/300 mL        1,500 mg 150 mL/hr over 120 Minutes Intravenous  Once 08/27/21 0303 08/27/21 0654   08/27/21 0215  ceFEPIme (MAXIPIME) 2 g in sodium chloride 0.9 % 100 mL IVPB        2 g 200 mL/hr over 30 Minutes Intravenous  Once 08/27/21 0202 08/27/21 0327      Culture/Microbiology    Component Value Date/Time   SDES BLOOD RIGHT HAND 08/29/2021 1233   SPECREQUEST  08/29/2021 1233    BOTTLES DRAWN AEROBIC AND ANAEROBIC Blood Culture adequate volume   CULT  08/29/2021 1233    NO GROWTH 5 DAYS Performed at [facility], [place] [address], [place], [place] [address]    REPTSTATUS 09/03/2021 FINAL 08/29/2021 1233      Data Reviewed: I have personally reviewed the following labs and imaging studies.    CBC: Recent Labs  Lab 09/02/21 0536 09/03/21 0153 09/04/21 0210 09/05/21 0406 09/06/21 0315  WBC 5.0 5.0 6.2 6.6 6.7  HGB 9.7* 9.3* 9.4* 9.1* 8.9*  HCT 30.1* 28.4* 28.6* 27.9* 27.2*  MCV 85.8 86.1 86.9 86.9 86.1  PLT 128* 132* 117* 120* 136*    Basic Metabolic Panel: Recent Labs   Lab 09/01/21 1546 09/02/21 0534 09/03/21 0153 09/04/21 0210 09/05/21 0406 09/06/21 0315 09/07/21 0515  NA  --  130* 128* 128* 129* 127* 128*  K  --  3.3* 3.4* 4.1  4.5 4.6 4.2  CL  --  96* 95* 94* 95* 90* 92*  CO2  --  20* 19* 20* 21* 21* 24  GLUCOSE  --  82 119* 104* 93 98 140*  BUN  --  34* 32* 34* 38* 46* 43*  CREATININE  --  4.13* 4.22* 4.16* 4.21* 4.32* 4.37*  CALCIUM  --  8.7* 8.9 9.0 9.4 9.3 9.1  MG 2.0  --  1.7  --  1.7 1.7 1.9  PHOS 4.6 4.2 3.7  --  3.7 4.1 3.1    GFR: Estimated Creatinine Clearance: 12.7 mL/min (A) (by C-G formula based on SCr of 4.37 mg/dL (H)). Liver Function Tests: Recent Labs  Lab 09/02/21 0534 09/03/21 0153 09/05/21 0406 09/06/21 0315 09/07/21 0515  AST  --   --  8* 14* 12*  ALT  --   --  9 6 9   ALKPHOS  --   --  153* 160* 174*  BILITOT  --   --  0.2* 0.6 0.6  PROT  --   --  5.0* 5.2* 5.2*  ALBUMIN <1.5* <1.5* <1.5* <1.5* <1.5*    No results for input(s): LIPASE, AMYLASE in the last 168 hours.  No results for input(s): AMMONIA in the last 168 hours. Coagulation Profile: No results for input(s): INR, PROTIME in the last 168 hours. Cardiac Enzymes: No results for input(s): CKTOTAL, CKMB, CKMBINDEX, TROPONINI in the last 168 hours. BNP (last 3 results) No results for input(s): PROBNP in the last 8760 hours. HbA1C: No results for input(s): HGBA1C in the last 72 hours. CBG: Recent Labs  Lab 09/06/21 1940 09/06/21 2322 09/07/21 0323 09/07/21 0737 09/07/21 1133  GLUCAP 92 136* 128* 106* 101*    Lipid Profile: Recent Labs    09/06/21 0315  TRIG 161*    Thyroid Function Tests: No results for input(s): TSH, T4TOTAL, FREET4, T3FREE, THYROIDAB in the last 72 hours. Anemia Panel: No results for input(s): VITAMINB12, FOLATE, FERRITIN, TIBC, IRON, RETICCTPCT in the last 72 hours.  Sepsis Labs: No results for input(s): PROCALCITON, LATICACIDVEN in the last 168 hours.   Recent Results (from the past 240 hour(s))  Culture,  blood (routine x 2)     Status: None   Collection Time: 08/29/21 12:24 PM   Specimen: BLOOD LEFT HAND  Result Value Ref Range Status   Specimen Description BLOOD LEFT HAND  Final   Special Requests   Final    BOTTLES DRAWN AEROBIC AND ANAEROBIC Blood Culture adequate volume   Culture   Final    NO GROWTH 5 DAYS Performed at [facility], [address], [place], [place] [address]    Report Status 09/03/2021 FINAL  Final  Culture, blood (routine x 2)     Status: None   Collection Time: 08/29/21 12:33 PM   Specimen: BLOOD RIGHT HAND  Result Value Ref Range Status   Specimen Description BLOOD RIGHT HAND  Final   Special Requests   Final    BOTTLES DRAWN AEROBIC AND ANAEROBIC Blood Culture adequate volume   Culture   Final    NO GROWTH 5 DAYS Performed at [facility], [place] [address] [place], [place] [address]    Report Status 09/03/2021 FINAL  Final     Radiology Studies: DG Abd 1 View  Result Date: 09/07/2021 CLINICAL DATA:  Ileus EXAM: ABDOMEN - 1 VIEW COMPARISON:  KUB dated 09/04/2021 FINDINGS: The enteric catheter tip projects over the expected location of the pylorus. Diffuse gaseous distension  of the small bowel is again seen, similar to slightly improved compared to the prior study. A peritoneal dialysis catheter is again seen.  The bones are stable. IMPRESSION: Gaseous distention of the small bowel is stable to slightly decreased compared to the study from 09/04/2021. Electronically Signed   By: Valetta Mole M.D.   On: 09/07/2021 08:04   [place] EKG SITE RITE  Result Date: 09/05/2021 If Site Rite image not attached, placement could not be confirmed due to current cardiac rhythm.    LOS: 11 days   Flora Lipps, MD [facility] 09/07/2021, 12:01 PM

## 2021-09-07 NOTE — Progress Notes (Addendum)
It was occluded flush both port at the beginning. PICC was positional and now both port is working well. HS [employer]

## 2021-09-07 NOTE — Progress Notes (Signed)
Patient taken to 5n09 via bed without incident.  Report given to Rn bedside.

## 2021-09-08 ENCOUNTER — Inpatient Hospital Stay (HOSPITAL_COMMUNITY): Payer: Medicare Other

## 2021-09-08 LAB — COMPREHENSIVE METABOLIC PANEL
ALT: 10 U/L (ref 0–44)
AST: 14 U/L — ABNORMAL LOW (ref 15–41)
Albumin: <1.5 g/dL — ABNORMAL LOW (ref 3.5–5.0)
Alkaline Phosphatase: 160 U/L — ABNORMAL HIGH (ref 38–126)
Anion gap: 11 (ref 5–15)
BUN: 48 mg/dL — ABNORMAL HIGH (ref 6–20)
CO2: 26 mmol/L (ref 22–32)
Calcium: 9.6 mg/dL (ref 8.9–10.3)
Chloride: 90 mmol/L — ABNORMAL LOW (ref 98–111)
Creatinine, Ser: 4.42 mg/dL — ABNORMAL HIGH (ref 0.44–1.00)
GFR, Estimated: 11 mL/min — ABNORMAL LOW (ref >60)
Glucose, Bld: 111 mg/dL — ABNORMAL HIGH (ref 70–99)
Potassium: 4.3 mmol/L (ref 3.5–5.1)
Sodium: 127 mmol/L — ABNORMAL LOW (ref 135–145)
Total Bilirubin: 0.5 mg/dL (ref 0.3–1.2)
Total Protein: 5.2 g/dL — ABNORMAL LOW (ref 6.5–8.1)

## 2021-09-08 LAB — GLUCOSE, CAPILLARY
Glucose-Capillary: 103 mg/dL — ABNORMAL HIGH (ref 70–99)
Glucose-Capillary: 95 mg/dL (ref 70–99)
Glucose-Capillary: 89 mg/dL (ref 70–99)
Glucose-Capillary: 109 mg/dL — ABNORMAL HIGH (ref 70–99)

## 2021-09-08 LAB — VANCOMYCIN, RANDOM: Vancomycin Rm: 30

## 2021-09-08 LAB — MAGNESIUM: Magnesium: 1.8 mg/dL (ref 1.7–2.4)

## 2021-09-08 LAB — PHOSPHORUS: Phosphorus: 2.5 mg/dL (ref 2.5–4.6)

## 2021-09-08 MED ORDER — DELFLEX-LC/2.5% DEXTROSE 394 MOSM/L IP SOLN
INTRAPERITONEAL | Status: DC
  Administered 2021-09-08: 17:00:00 5000 mL via INTRAPERITONEAL

## 2021-09-08 MED ORDER — MAGNESIUM SULFATE 2 GM/50ML IV SOLN
2.0000 g | Freq: Once | INTRAVENOUS | Status: AC
  Administered 2021-09-08: 12:00:00 2 g via INTRAVENOUS
  Filled 2021-09-08: qty 50

## 2021-09-08 MED ORDER — VITAL 1.5 CAL PO LIQD
1000.0000 mL | ORAL | Status: DC
Start: 2021-09-08 — End: 2021-09-10
  Administered 2021-09-08 – 2021-09-10 (×3): 1000 mL
  Filled 2021-09-08 (×4): qty 1000

## 2021-09-08 MED ORDER — FREE WATER
175.0000 mL | Status: DC
  Administered 2021-09-10 (×4): 175 mL

## 2021-09-08 MED ORDER — TRACE MINERALS CU-MN-SE-ZN 300-55-60-3000 MCG/ML IV SOLN
INTRAVENOUS | Status: AC
  Filled 2021-09-08: qty 312

## 2021-09-08 MED ORDER — PROSOURCE TF PO LIQD
45.0000 mL | Freq: Two times a day (BID) | ORAL | Status: DC
  Administered 2021-09-09 – 2021-09-10 (×3): 45 mL
  Filled 2021-09-08 (×4): qty 45

## 2021-09-08 NOTE — Progress Notes (Addendum)
PROGRESS NOTE    Jane [name]  WGN:[id number] DOB: 1966/02/28 DOA: 08/26/2021 PCP: [name], MD   Brief Narrative/Hospital Course:  Jane [name], 55 y.o. female with past medical history of end-stage renal disease on peritoneal dialysis, schizoaffective disorder, history of osteomyelitis, epidural abscess presented to hospital with nausea, vomiting, diarrhea, abdominal pain and cough.  At baseline, patient is wheelchair-bound and has been dealing with disabling psych issues.  In the ED, patient had hypotension, acute encephalopathy and was admitted hospital for septic shock, aspiration pneumonia, acute metabolic encephalopathy initially to the ICU.  Patient was seen by nephrology and ID.  CT scan of the abdomen pelvis showed pneumonia and small ascites.  Patient was treated for spontaneous bacterial peritonitis secondary to MRSE from PD catheter and aspiration pneumonia.  Patient has been on NG tube feeding since that she has not been taking orally.  Patient also has rectal tube in place.  Assessment & Plan:  Septic shock due to spontaneous bacterial peritonitis secondary to peritoneal dialysis catheter associated infection/Aspiration pneumonia,  ID has seen the patient is plan is to complete 2 weeks of IV vancomycin. On nystatin p.o. 4 times daily for fungal peritonitis prophylaxis.  On midodrine as well.  We will repeat chest x-ray again.  New fever with tachycardia.  T-max of 100.4 F.  Abdominal x-ray improved.  Receiving 2 weeks of IV vancomycin.  Nystatin for fungal peritonitis prophylaxis.  Obtain blood cultures today.  Continue Tylenol as needed for fever.  We will also get chest x-ray for evaluation.  Poor PEG tube tolerance.   On  TPN.  Abdominal x-ray shows improved ileus.  Reglan on hold since 09/01/21 due to diarrhea.  Nutrition on board.  On decreased TPN and plan to increase tube feeds since xray improved.   ESRD on PD:  On nephrology during hospitalization for  dialysis.  Hypokalemia  Improved after replacement.  Potassium 4.3  Hyponatremia: Mild hyponatremia.  At this time, sodium level of 127  Hypoglycemia:  Latest POC glucose of 95.  On  TPN, will continue to monitor.   Sinus tachycardia/borderline hypotension. Continue midodrine.  Midodrine dose was increased from 5 to 10mg .  We will continue to monitor closely.  Diarrhea with C. difficile antigen positive toxin negative, indeterminate results.   ID did not recommend treatment for C. difficile.  Supportive care if needed but due to ileus we will hold off with antimotility agents.  Diarrhea has improved at this time.  Off rectal tube.  MRSE and GPR positive in 1/2 sets felt to be contaminant.  We will repeat blood cultures if continues to spike fever.  Acute metabolic encephalopathy:Multiple cognitive and psychiatric comorbidities, at baseline.  Is more alert awake but tearful easily.  Schizoaffective disorder/bipolar disorder/Multiple cognitive and psychiatric comorbidities: Patient is on Cogentin, fluoxetine, Risperdal, Keppra at home. Cont supportive care more alert awake and mildly communicative.  Tearful at times.  Hypothyroidism: Continue Synthroid   History of discitis/epidural abscess  Status postlaminectomy on 01/25/2021 with MSSA and completed 8 weeks of antibiotic.  Patient is wheelchair bound at this time.  Needing total assist and care.   Anemia- Likely multifactorial from chronic kidney disease.  Patient was FOBT positive on 11/ 5 but hemoglobin has been stable since then.   FOBT positive 11/5 although no evidence of active GI bleeding.    Moderate protein calorie malnutrition, present on admission. Patient has been seen by nutritionist.  Was started on dysphagia diet as well as tube feeding.  Patient had PEG tube intolerance so PICC line has been placed and patient has been started on TPN.  Dietary adjusting tube feeding and TPN.  Pressure injury of right sacrum stage II POA   continue local wound care  Pressure Injury 01/25/21 Sacrum Right;Left Stage 2 -  Partial thickness loss of dermis presenting as a shallow open injury with a red, pink wound bed without slough. (Active)  01/25/21 1951  Location: Sacrum  Location Orientation: Right;Left  Staging: Stage 2 -  Partial thickness loss of dermis presenting as a shallow open injury with a red, pink wound bed without slough. (old pressure injury that is healing)  Wound Description (Comments):   Present on Admission: Yes   DVT prophylaxis: heparin injection 5,000 Units Start: 08/30/21 1400    Code Status: Full Code  Family Communication:  Spoke with the patient's sister on the phone on 09/08/2021 and updated her about the clinical condition of the patient.  Status is: Inpatient  Remains inpatient appropriate because: Ongoing management of sepsis, poor oral intake, ileus, tube feeding,  need for TPN.  Disposition. Likely home with family when medically stable.  Currently not medically stable  Subjective: Today, patient was seen and examined at bedside.  Nursing staff reported that patient will had a new fever.  No mention of aspiration.  Abdominal x-ray has improved.  Objective:  Vitals:   09/08/21 0400 09/08/21 0829 09/08/21 1013 09/08/21 1134  BP: 114/70 (!) 108/59 113/63 123/72  Pulse: (!) 117 (!) 118 (!) 120 (!) 110  Resp: 20 20 (!) 24 (!) 27  Temp: 98 F (36.7 C) 99.8 F (37.7 C) (!) 100.4 F (38 C) 98.7 F (37.1 C)  TempSrc: Oral Axillary Axillary Oral  SpO2: 94% 95% 99% 96%  Weight:   60.3 kg   Height:       Weight change: -1.1 kg  Intake/Output Summary (Last 24 hours) at 09/08/2021 1338 Last data filed at 09/08/2021 1016 Gross per 24 hour  Intake 307.38 ml  Output 1258 ml  Net -950.62 ml    Net IO Since Admission: -4,077.31 mL [09/08/21 1338]   Physical Examination: General:   not in obvious distress, frail-appearing, deconditioned, tearful at times HENT:   No scleral pallor or  icterus noted. Oral mucosa is moist.  Cortrak tube in place. Chest:   Diminished breath sounds bilaterally. CVS: S1 &S2 heard. No murmur.  Regular rate and rhythm. Abdomen: Soft, nontender, nondistended.  Bowel sounds are heard.  Peritoneal dialysis catheter in place. Extremities: No cyanosis, clubbing or edema.  Peripheral pulses are palpable. Psych: Alert, awake and mildly verbal, tearful, anxious mood  CNS: All extremities. Skin: Warm and dry.  No rashes noted.  Medications reviewed:  Scheduled Meds:  benztropine  1 mg Per Tube QHS   Chlorhexidine Gluconate Cloth  6 each Topical Daily   darbepoetin (ARANESP) injection - DIALYSIS  40 mcg Subcutaneous Q Tue-1800   FLUoxetine  20 mg Per Tube Daily   gentamicin cream  1 application Topical Daily   heparin injection (subcutaneous)  5,000 Units Subcutaneous Q8H   levETIRAcetam  500 mg Per Tube BID   levothyroxine  50 mcg Per Tube Q breakfast   midodrine  10 mg Per Tube TID WC   nystatin  5 mL Per Tube QID   pantoprazole sodium  40 mg Per Tube Daily   risperiDONE  3 mg Per Tube QHS   sodium chloride flush  10-40 mL Intracatheter Q12H   sodium chloride flush  10-40 mL Intracatheter Q12H   sodium chloride flush  3 mL Intravenous Q12H   valproic acid  500 mg Per Tube BID   vancomycin variable dose per unstable renal function (pharmacist dosing)   Does not apply See admin instructions   Continuous Infusions:  sodium chloride Stopped (08/31/21 1917)   dialysis solution 2.5% low-MG/low-CA     feeding supplement (VITAL 1.5 CAL) 1,000 mL (09/07/21 2342)   TPN ADULT (ION) 30 mL/hr at 09/07/21 1800   TPN ADULT (ION)     Consultants: Nephrology,  infectious disease  Procedures: Cortrak tube tube placement Peritoneal dialysis PICC line placement.  Antimicrobials:  Anti-infectives (From admission, onward)    Start     Dose/Rate Route Frequency Ordered Stop   09/03/21 1500  vancomycin (VANCOREADY) IVPB 1000 mg/200 mL        1,000  mg 200 mL/hr over 60 Minutes Intravenous  Once 09/02/21 1534 09/04/21 0816   09/02/21 2008  vancomycin variable dose per unstable renal function (pharmacist dosing)         Does not apply See admin instructions 09/02/21 2008     08/30/21 1100  vancomycin (VANCOREADY) IVPB 1000 mg/200 mL  Status:  Discontinued        1,000 mg 200 mL/hr over 60 Minutes Intravenous every 72 hours 08/30/21 0928 09/02/21 1534   08/29/21 2200  Ampicillin-Sulbactam (UNASYN) 3 g in sodium chloride 0.9 % 100 mL IVPB        3 g 200 mL/hr over 30 Minutes Intravenous Every 24 hours 08/29/21 1239 09/02/21 2255   08/28/21 2200  ceFEPIme (MAXIPIME) 2 g in sodium chloride 0.9 % 100 mL IVPB  Status:  Discontinued        2 g 200 mL/hr over 30 Minutes Intravenous Every 48 hours 08/27/21 0348 08/29/21 1239   08/28/21 0051  vancomycin variable dose per unstable renal function (pharmacist dosing)  Status:  Discontinued         Does not apply See admin instructions 08/28/21 0052 08/30/21 0936   08/27/21 0330  metroNIDAZOLE (FLAGYL) IVPB 500 mg  Status:  Discontinued        500 mg 100 mL/hr over 60 Minutes Intravenous Every 12 hours 08/27/21 0328 08/29/21 1239   08/27/21 0315  vancomycin (VANCOREADY) IVPB 1500 mg/300 mL        1,500 mg 150 mL/hr over 120 Minutes Intravenous  Once 08/27/21 0303 08/27/21 0654   08/27/21 0215  ceFEPIme (MAXIPIME) 2 g in sodium chloride 0.9 % 100 mL IVPB        2 g 200 mL/hr over 30 Minutes Intravenous  Once 08/27/21 0202 08/27/21 0327      Culture/Microbiology    Component Value Date/Time   SDES BLOOD RIGHT HAND 08/29/2021 1233   SPECREQUEST  08/29/2021 1233    BOTTLES DRAWN AEROBIC AND ANAEROBIC Blood Culture adequate volume   CULT  08/29/2021 1233    NO GROWTH 5 DAYS Performed at [facility], [place] [address] [place], [place] [address]    REPTSTATUS 09/03/2021 FINAL 08/29/2021 1233      Data Reviewed: I have personally reviewed the following labs and imaging studies.     CBC: Recent Labs  Lab 09/02/21 0536 09/03/21 0153 09/04/21 0210 09/05/21 0406 09/06/21 0315  WBC 5.0 5.0 6.2 6.6 6.7  HGB 9.7* 9.3* 9.4* 9.1* 8.9*  HCT 30.1* 28.4* 28.6* 27.9* 27.2*  MCV 85.8 86.1 86.9 86.9 86.1  PLT 128* 132* 117* 120* 136*    Basic  Metabolic Panel: Recent Labs  Lab 09/03/21 0153 09/04/21 0210 09/05/21 0406 09/06/21 0315 09/07/21 0515 09/08/21 0421  NA 128* 128* 129* 127* 128* 127*  K 3.4* 4.1 4.5 4.6 4.2 4.3  CL 95* 94* 95* 90* 92* 90*  CO2 19* 20* 21* 21* 24 26  GLUCOSE 119* 104* 93 98 140* 111*  BUN 32* 34* 38* 46* 43* 48*  CREATININE 4.22* 4.16* 4.21* 4.32* 4.37* 4.42*  CALCIUM 8.9 9.0 9.4 9.3 9.1 9.6  MG 1.7  --  1.7 1.7 1.9 1.8  PHOS 3.7  --  3.7 4.1 3.1 2.5    GFR: Estimated Creatinine Clearance: 12.6 mL/min (A) (by C-G formula based on SCr of 4.42 mg/dL (H)). Liver Function Tests: Recent Labs  Lab 09/03/21 0153 09/05/21 0406 09/06/21 0315 09/07/21 0515 09/08/21 0421  AST  --  8* 14* 12* 14*  ALT  --  9 6 9 10   ALKPHOS  --  153* 160* 174* 160*  BILITOT  --  0.2* 0.6 0.6 0.5  PROT  --  5.0* 5.2* 5.2* 5.2*  ALBUMIN <1.5* <1.5* <1.5* <1.5* <1.5*    No results for input(s): LIPASE, AMYLASE in the last 168 hours.  No results for input(s): AMMONIA in the last 168 hours. Coagulation Profile: No results for input(s): INR, PROTIME in the last 168 hours. Cardiac Enzymes: No results for input(s): CKTOTAL, CKMB, CKMBINDEX, TROPONINI in the last 168 hours. BNP (last 3 results) No results for input(s): PROBNP in the last 8760 hours. HbA1C: No results for input(s): HGBA1C in the last 72 hours. CBG: Recent Labs  Lab 09/07/21 0737 09/07/21 1133 09/07/21 1603 09/08/21 0729 09/08/21 1129  GLUCAP 106* 101* 96 103* 95    Lipid Profile: Recent Labs    09/06/21 0315  TRIG 161*    Thyroid Function Tests: No results for input(s): TSH, T4TOTAL, FREET4, T3FREE, THYROIDAB in the last 72 hours. Anemia Panel: No results for  input(s): VITAMINB12, FOLATE, FERRITIN, TIBC, IRON, RETICCTPCT in the last 72 hours.  Sepsis Labs: No results for input(s): PROCALCITON, LATICACIDVEN in the last 168 hours.   No results found for this or any previous visit (from the past 240 hour(s)).    Radiology Studies: DG Abd 1 View  Result Date: 09/08/2021 CLINICAL DATA:  55 year old female on peritoneal dialysis. Query ileus. EXAM: ABDOMEN - 1 VIEW COMPARISON:  09/07/2021 and earlier. FINDINGS: Portable AP supine view at 1024 hours. Stable enteric feeding tube, tip at the gastric antrum. Non obstructed bowel gas pattern, improved since 09/04/2021. Peritoneal dialysis catheter again projects over the lower abdomen. Grossly negative lung bases. No acute osseous abnormality identified. IMPRESSION: 1. Non obstructed bowel gas pattern, improved since 09/04/2021. Doubt ileus. 2. Stable enteric feeding tube, peritoneal dialysis catheter. Electronically Signed   By: Genevie Ann M.D.   On: 09/08/2021 10:41   DG Abd 1 View  Result Date: 09/07/2021 CLINICAL DATA:  Ileus EXAM: ABDOMEN - 1 VIEW COMPARISON:  KUB dated 09/04/2021 FINDINGS: The enteric catheter tip projects over the expected location of the pylorus. Diffuse gaseous distension of the small bowel is again seen, similar to slightly improved compared to the prior study. A peritoneal dialysis catheter is again seen.  The bones are stable. IMPRESSION: Gaseous distention of the small bowel is stable to slightly decreased compared to the study from 09/04/2021. Electronically Signed   By: Valetta Mole M.D.   On: 09/07/2021 08:04     LOS: 12 days   Flora Lipps, MD [facility] 09/08/2021, 1:38  PM

## 2021-09-08 NOTE — Progress Notes (Addendum)
Late Entry Progress Note for 11/16  Pt making slow progress toward goals,  Crying without any obvious reasons.  Emphasis on transitions, balance at EOB and standing.    09/08/21 1000  PT Visit Information  Last PT Received On 09/08/21  Assistance Needed +2  History of Present Illness 55 y/o female admitted 08/27/21 with n/v/d, abdominal pain and cough. Found with sepsis, possible aspiration PNA, and cdiff. PMH includes: ESRD on PD, schizoaffective disorder,cognitive impairment, prior discitis/osteomyelitis/epidural abscess.  Subjective Data  Patient Stated Goal Pt unable to state  Pain Assessment  Faces Pain Scale 6  Breathing 0  Negative Vocalization 2  Facial Expression 1  Consolability 1  Pain Location Unable to indicate specifically. Per sister likely back.  Pain Descriptors / Indicators Grimacing;Crying  Pain Intervention(s) Monitored during session  Cognition  Arousal/Alertness Awake/alert  Behavior During Therapy Flat affect  Overall Cognitive Status No family/caregiver present to determine baseline cognitive functioning  General Comments pt with history of cognitive impairments but no family present. Pt able to state she is at the hospital but all other verbalizations more of a moan and difficult to understand with pt following single step commands with repetition and delay  Bed Mobility  Overal bed mobility Needs Assistance  Bed Mobility Rolling;Sidelying to Sit;Sit to Supine  Rolling Max assist  Supine to sit Max assist  Sit to supine +2 for physical assistance;Total assist  General bed mobility comments Cues for direction still does not stave off crying, assist up via R elbow.  2 person assist to supine.  Transfers  Overall transfer level Needs assistance  Transfers Sit to/from Stand  Sit to Stand Max assist;+2 safety/equipment  General transfer comment pt asking to get back in bed, but used face to face assist to stand at EOB while padding replaced prior to laying  down.  Balance  Sitting-balance support Bilateral upper extremity supported;Feet unsupported  Sitting balance-Leahy Scale Poor  Sitting balance - Comments sat EOB for ~8 min woking up upright sitting with UE support.  pt started to fatigue and list heavily to the R after about 4 min. needing up to maximal assist to return to upright sitting posture.  Standing balance-Leahy Scale Zero  Exercises  Exercises Other exercises  Other Exercises  Other Exercises bil hip/knee flexion/ext AAROM x10 reps with graded assist/resistance.  PT - End of Session  Activity Tolerance Patient limited by fatigue  Patient left in bed;with call bell/phone within reach;with bed alarm set;with nursing/sitter in room  Nurse Communication Mobility status   PT - Assessment/Plan  PT Plan Current plan remains appropriate  PT Visit Diagnosis Other abnormalities of gait and mobility (R26.89);Muscle weakness (generalized) (M62.81)  PT Frequency (ACUTE ONLY) Min 3X/week  Follow Up Recommendations Home health PT  Assistance recommended at discharge Frequent or constant Supervision/Assistance  PT equipment None recommended by PT  AM-PAC PT "6 Clicks" Mobility Outcome Measure (Version 2)  Help needed turning from your back to your side while in a flat bed without using bedrails? 1  Help needed moving from lying on your back to sitting on the side of a flat bed without using bedrails? 1  Help needed moving to and from a bed to a chair (including a wheelchair)? 1  Help needed standing up from a chair using your arms (e.g., wheelchair or bedside chair)? 1  Help needed to walk in hospital room? 1  Help needed climbing 3-5 steps with a railing?  1  6 Click Score 6  Consider Recommendation  of Discharge To: CIR/SNF/LTACH  Progressive Mobility  What is the highest level of mobility based on the progressive mobility assessment? Level 2 (Chairfast) - Balance while sitting on edge of bed and cannot stand  Mobility Out of bed to  chair with meals  PT Goal Progression  Progress towards PT goals Not progressing toward goals - comment (not able to parti)  Acute Rehab PT Goals  PT Goal Formulation With family  Time For Goal Achievement 09/16/21  Potential to Achieve Goals Fair   09/08/2021  Ginger Carne., PT Acute Rehabilitation Services [phone]  (pager) [phone]  (office)

## 2021-09-08 NOTE — Progress Notes (Signed)
PHARMACY - TOTAL PARENTERAL NUTRITION CONSULT NOTE   Indication: Prolonged ileus and Intolerance to enteral feeding   Patient Measurements: Height: 5\' 4"  (162.6 cm) Weight: 62 kg (136 lb 11 oz) IBW/kg (Calculated) : 54.7 TPN AdjBW (KG): 62.5 Body mass index is 23.46 kg/m. Admit weight 62.5 kg  Assessment: 55 year old female who presented 08/26/21 to the ED with 1 week history of nausea, vomiting, and diarrhea. PMH most notable for ESRD on PD at home and history of discitis/osteomyelitis with epidural abscess in April 2022. Patient was admitted with sepsis, aspiration pneumonia, and peritonitis and transferred to ICU on 11/5 for septic shock. Septic shock resolved, transferred to progressive level of care, and patient receiving antibiotic treatment for MRSA bacteremia and peritonitis. Enteral nutrition in form of tube feeds via Cortrak initiated on 08/31/21 at trickle rate. Patient had vomiting 11/20 and tube feeds were held with KUB showing ileus. Reglan was initiated and tube feeds re-attempted at trickle rate however patient subsequently had projectile vomiting on 11/13 and tube feeds were held again. At 11 days post admission and pharmacy consulted to initiate TPN for chronic malnutrition with persistent ileus and inability to tolerate enteral nutrition.   Glucose / Insulin: BG < 120, A1c (April) 5.6, no insulin required Electrolytes: Na/Cl trending down (127/90), mag 1.8. unable to calculate CoCa with albumin <1.5. Others wnl  Renal: CCPD nightly continued. BUN 48 Hepatic: LFTs low-normal. Tbili within normal limits. Triglycerides 161 Intake / Output; MIVF: + upper and lower non-pitting edema, orbital edema; no output documented 11/16, LBM 11/15  GI Imaging:  11/5 CT Abd - Thickened appearance of the small bowel likely related to ascites. Enteritis is less likely. No bowel obstruction.  11/10 Abd xray - possible adynamic SB ileus vs partial SBO 11/13 Abd Xray  - slight decrease in gaseous  distention, suggestive of possibly improving ileus 11/16 Abd Xray - slight decreased gaseous distention GI Surgeries / Procedures: none  Central access: Double lumen PICC (11/14) TPN start date: 09/06/21  Nutritional Goals: Goal TPN rate is 58 mL/hr (provides 90 g of protein and 1911 kcals per day)  RD Assessment: Estimated Needs Total Energy Estimated Needs: 1900-2100 Total Protein Estimated Needs: 90-110 grams Total Fluid Estimated Needs: 1000 ml + UOP  Current Nutrition:  Tube feeding via Cortrak - at 40 ml/hr (goal 55 ml/hr, meeting 70% of needs) Dysphagia 2 Diet - No oral intake per RN  Plan:  RD to advance TF to goal, likely DC TPN 11/18   Continue Concentrated TPN at half rate of 30 mL/hr at 1800 meeting ~50% of needs  Electrolytes in TPN: Increase Na 125 mEq/L, Mg 10 mEq/L, and Phos 74mmol/L; Continue K 26mEq/L, Ca 15mEq/L. Cl:Ac 2:1   Add standard MVI and trace elements to TPN -Remove chromium with ESRD on CCPD Give Mg 2g IV x1 Stop BG monitoring  Monitor TPN labs on Mon/Thurs  Monitor TF titration stop TPN as able   Benetta Spar, PharmD, BCPS, BCCP Clinical Pharmacist  Please check AMION for all [place] phone numbers After 10:00 PM, call [place]

## 2021-09-08 NOTE — Progress Notes (Signed)
Pharmacy Antibiotic Note  Jane [name] is a 55 y.o. female admitted on 08/26/2021 with pneumonia.  Pharmacy has been consulted for vancomycin dosing. Patient is on continuous PD with 4 exchanges of 2 L and tolerating well. Blood and peritoneal fluid cultures positive for MRSE.   Vancomycin random this AM is still elevated at 30 mcg/mL (above range of 15-20 ug/mL after dose of Vancomycin 1 gram IV given >72 hours ago. Based on her kinetics, her dosing interval should be extended to ~4 days since her level was elevated and we are waiting for it to drop below 20 to redose (accumulation supports this), therefore likely will have enough Vancomycin on board to complete course of 2 weeks tomorrow.    Plan: No further Vancomycin at this time due to elevated level.  No further vancomycin as course ends tomorrow 11/18   Height: 5\' 4"  (162.6 cm) Weight: 62 kg (136 lb 11 oz) IBW/kg (Calculated) : 54.7  Temp (24hrs), Avg:98.1 F (36.7 C), Min:97.8 F (36.6 C), Max:98.6 F (37 C)  Recent Labs  Lab 09/02/21 0536 09/02/21 0839 09/03/21 0153 09/04/21 0210 09/05/21 0406 09/06/21 0315 09/07/21 0515 09/08/21 0421  WBC 5.0  --  5.0 6.2 6.6 6.7  --   --   CREATININE  --   --  4.22* 4.16* 4.21* 4.32* 4.37* 4.42*  VANCORANDOM  --    < >  --   --   --  34  --  30   < > = values in this interval not displayed.     Estimated Creatinine Clearance: 12.6 mL/min (A) (by C-G formula based on SCr of 4.42 mg/dL (H)).    Allergies  Allergen Reactions   Icodextrin Rash    Antimicrobials this admission: Cefepime 11/5>>11/7 Vanco 11/5>> (11/18) Metronid 11/5>>11/7 Unasyn 11/7>> 11/11  Dose adjustments this admission: 11/5 1250 mg IV 11/8 Vancomycin random level = 19 > gave 1 g IV 11/11 Vancomycin random level = 26 11/12 gave 1 g IV 11/15 Vancomycin random level - 34 11/17: Level 30   Microbiology results: 11/5 Bcx: MRSE 11/5 Pleural fluid: MRSE 11/5 salmonella not detected   Jane [name] A.  [name], PharmD, BCPS, FNKF Clinical Pharmacist [place] Please utilize Amion for appropriate phone number to reach the unit pharmacist ([place])

## 2021-09-08 NOTE — Progress Notes (Addendum)
Nutrition Follow-up  DOCUMENTATION CODES:  Non-severe (moderate) malnutrition in context of chronic illness  INTERVENTION:  - Continue TPN dosing per Pharmacy, recommend leaving at half rate (30 ml/hr) today with plans to discontinue TPN tomorrow (11/18)  Continue Tube feeds via Cortrak tube: - Increase Vital 1.5 to goal rate of 55 ml/hr (1320 ml/day) - ProSource TF 45 ml BID -free water flushes (to begin when TPN is discontinued):  126ml Q4H  Tube feeding regimen at goal regimen provides 2060 kcal, 111 grams of protein, and 1008 ml of H2O (2065ml total free water with flushes).   - Continue dysphagia 2 diet with thin liquids as tolerated - Encourage PO intake  NUTRITION DIAGNOSIS:  Moderate Malnutrition related to chronic illness (ESRD, CHF) as evidenced by mild fat depletion, moderate muscle depletion. Ongoing, being addressed via nutrition support  GOAL:  Patient will meet greater than or equal to 90% of their needs Progressing  MONITOR:  PO intake, Labs, Weight trends, TF tolerance, Skin, I & O's, Other (TPN)  REASON FOR ASSESSMENT:  Consult Enteral/tube feeding initiation and management  ASSESSMENT:  55 year old female who presented to the ED on 11/04 with N/V/D x 1 week. PMH of ESRD on PD at home, HTN, HLD, schizophrenia, seizures, CHF, hypothyroidism, discitis/osteomyelitis and epidural abscess in April 2022. Pt admitted with sepsis, aspiration pneumonia, spontaneous bacterial peritonitis.  11/05 - transferred to ICU 11/06 - NPO 11/07 - diet advanced to dysphagia 2 with thin liquids 11/09 - Cortrak placed (tip gastric), tube feeds started at trickle rate 11/10 - pt with vomiting in early AM, tube feeds held, KUB showing ileus, reglan started, tube feeds restarted at trickle rate 11/13 - projectile vomiting, tube feeds held 11/14 - PICC placed 11/15 - TPN start  Discussed pt with RD who has been following pt thus far, Pharmacist, and MD. KUB was repeated today which  states "nonobstructive bowel gas pattern, improved since 09/04/21. Doubt ileus." Also noted feeding tube still within appropriate gastric position. Per discussions with medical team, plan to advance TF to goal rate today. If pt tolerates advancement, plan to d/c TPN tomorrow. Will discuss plan with RN as well.   Pt with mild pitting generalized edema, non-pitting edema to BUE and BLE. Nephrology monitor for need to transition from CCPD to HD if volume is too much.  Admit weight: 62.5 kg Current weight: 60.3 kg  Medications: Scheduled Meds:  benztropine  1 mg Per Tube QHS   Chlorhexidine Gluconate Cloth  6 each Topical Daily   darbepoetin (ARANESP) injection - DIALYSIS  40 mcg Subcutaneous Q Tue-1800   FLUoxetine  20 mg Per Tube Daily   gentamicin cream  1 application Topical Daily   heparin injection (subcutaneous)  5,000 Units Subcutaneous Q8H   levETIRAcetam  500 mg Per Tube BID   levothyroxine  50 mcg Per Tube Q breakfast   midodrine  10 mg Per Tube TID WC   nystatin  5 mL Per Tube QID   pantoprazole sodium  40 mg Per Tube Daily   risperiDONE  3 mg Per Tube QHS   sodium chloride flush  10-40 mL Intracatheter Q12H   sodium chloride flush  10-40 mL Intracatheter Q12H   sodium chloride flush  3 mL Intravenous Q12H   valproic acid  500 mg Per Tube BID   vancomycin variable dose per unstable renal function (pharmacist dosing)   Does not apply See admin instructions  Continuous Infusions:  sodium chloride Stopped (08/31/21 1917)   dialysis solution 2.5%  low-MG/low-CA     feeding supplement (VITAL 1.5 CAL) 1,000 mL (09/07/21 2342)   TPN ADULT (ION) 30 mL/hr at 09/07/21 1800   TPN ADULT (ION)     Labs: Recent Labs  Lab 09/06/21 0315 09/07/21 0515 09/08/21 0421  NA 127* 128* 127*  K 4.6 4.2 4.3  CL 90* 92* 90*  CO2 21* 24 26  BUN 46* 43* 48*  CREATININE 4.32* 4.37* 4.42*  CALCIUM 9.3 9.1 9.6  MG 1.7 1.9 1.8  PHOS 4.1 3.1 2.5  GLUCOSE 98 140* 111*  CBGs: 95-103 x24  hours  CCPD total output for exchanges: 1258 ml I/O's: -4077L since admit  Diet Order:   Diet Order             DIET DYS 2 Room service appropriate? Yes; Fluid consistency: Thin  Diet effective now                   EDUCATION NEEDS:   Not appropriate for education at this time  Skin:  Skin Assessment: Skin Integrity Issues: Other: MASD to groin and buttocks, wound to R heel  Last BM:  09/07/21 type 6 x 1, type 7 x 1  Height:   Ht Readings from Last 1 Encounters:  08/28/21 5\' 4"  (1.626 m)    Weight:   Wt Readings from Last 1 Encounters:  09/08/21 60.3 kg    BMI:  Body mass index is 22.82 kg/m.  Estimated Nutritional Needs:   Kcal:  1900-2100  Protein:  90-110 grams  Fluid:  1000 ml + UOP    Jane [name] A., MS, RD, LDN (she/her/hers) RD pager number and weekend/on-call pager number located in [place].

## 2021-09-08 NOTE — Significant Event (Addendum)
MEWS Notification  VS: T 100.40F, BP 113/63, HR 120, RR 24, SpO2 99% on room air  Assessment:   Skin is very warm, dry & pink. Pt does not appear to be in distress. Per RN, mentation is improvement from prior assessment. She is tracking RN in room and answers yes/no questions.   Plan: PRN Tylenol for fever  Call rapid response for additional needs  Time Called: 1046 Time Arrived: [place] RN

## 2021-09-08 NOTE — Progress Notes (Signed)
[name] Progress Note   Subjective:  seen in room, not responsive verbally. 1258 cc removed overnight w/ CCPD. Wt's down 2-3 lbs.   Objective Vitals:   09/08/21 0400 09/08/21 0829 09/08/21 1013 09/08/21 1134  BP: 114/70 (!) 108/59 113/63 123/72  Pulse: (!) 117 (!) 118 (!) 120 (!) 110  Resp: 20 20 (!) 24 (!) 27  Temp: 98 F (36.7 C) 99.8 F (37.7 C) (!) 100.4 F (38 C) 98.7 F (37.1 C)  TempSrc: Oral Axillary Axillary Oral  SpO2: 94% 95% 99% 96%  Weight:   60.3 kg   Height:         Additional Objective Labs: Basic Metabolic Panel: Recent Labs  Lab 09/06/21 0315 09/07/21 0515 09/08/21 0421  NA 127* 128* 127*  K 4.6 4.2 4.3  CL 90* 92* 90*  CO2 21* 24 26  GLUCOSE 98 140* 111*  BUN 46* 43* 48*  CREATININE 4.32* 4.37* 4.42*  CALCIUM 9.3 9.1 9.6  PHOS 4.1 3.1 2.5    CBC: Recent Labs  Lab 09/02/21 0536 09/03/21 0153 09/04/21 0210 09/05/21 0406 09/06/21 0315  WBC 5.0 5.0 6.2 6.6 6.7  HGB 9.7* 9.3* 9.4* 9.1* 8.9*  HCT 30.1* 28.4* 28.6* 27.9* 27.2*  MCV 85.8 86.1 86.9 86.9 86.1  PLT 128* 132* 117* 120* 136*        Physical Exam General: sleeping, in bed, cortrak in place Heart: RRR No m,r,g Lungs: Clear bilaterally  Abdomen: soft, nontender today Extremities: no sig LE edema Dialysis Access: PD cath in place   Medications:  sodium chloride Stopped (08/31/21 1917)   dialysis solution 2.5% low-MG/low-CA     dialysis solution 4.25% low-MG/low-CA     feeding supplement (VITAL 1.5 CAL) 1,000 mL (09/07/21 2342)   magnesium sulfate bolus IVPB 2 g (09/08/21 1225)   TPN ADULT (ION) 30 mL/hr at 09/07/21 1800   TPN ADULT (ION)      benztropine  1 mg Per Tube QHS   Chlorhexidine Gluconate Cloth  6 each Topical Daily   darbepoetin (ARANESP) injection - DIALYSIS  40 mcg Subcutaneous Q Tue-1800   FLUoxetine  20 mg Per Tube Daily   gentamicin cream  1 application Topical Daily   heparin injection (subcutaneous)  5,000 Units Subcutaneous Q8H    levETIRAcetam  500 mg Per Tube BID   levothyroxine  50 mcg Per Tube Q breakfast   midodrine  10 mg Per Tube TID WC   nystatin  5 mL Per Tube QID   pantoprazole sodium  40 mg Per Tube Daily   risperiDONE  3 mg Per Tube QHS   sodium chloride flush  10-40 mL Intracatheter Q12H   sodium chloride flush  10-40 mL Intracatheter Q12H   sodium chloride flush  3 mL Intravenous Q12H   valproic acid  500 mg Per Tube BID   vancomycin variable dose per unstable renal function (pharmacist dosing)   Does not apply See admin instructions    Dialysis Orders:  [place] [phone] (HT not open on weekends)  OP Nephrologist: Jane [name]  Ambulatory PD  7x/week  4 exchanges 2L    Assessment/Plan: Sepsis - due to asp PNA and PD cath related peritonitis Aspiration PNA - +bld cx thought to be contaminant.   ID signed off. IV unasyn for asp PNA. Diarrhea/ vomiting - rectal tube in place, Cdif Ag+, toxin negative. Conservative Rx.  Malnutrition - dysphagia diet rec'd by nutrition. Alb < 1.5. TPN started 11/15, running at  50% goal ~30 cc/hr.  MRSE PD cath related acute peritonitis - f/u cell count showed sig improvement on 11/8. No e/o tunnel infection. Cont Vancomycin for 2 wks total thru 11/18 per ID.  Hypotension/volume - sp pressors. Now on midodrine 10 tid. BPs are low- normal now.  ESRD - continue CCPD nightly, no edema on exam today. Will do combo 2.5% and 4.25%. getting very good UF w/ all 4.25% but maybe too good and wt's are down and Bp's down.  Anemia  - Hgb 8s > 9s> 10s > 9s.  Started esa 11/15 w/ darbe 40 ug sq every Tuesday.  Metabolic bone disease -  Corr Ca/Phos ok.  Nutrition - Alb low.  Prot supp when taking PO Mental retardation Seizure d/o Schizoaffective/Bipolar disorder - per primary  Hypokalemia - resolved   Jane Splinter, MD 09/08/2021, 12:52 PM  08/31/21 - Jane [name] spoke to RN at her dialysis clinic - has 2 family members (sister and BIL) who do her PD  treatments for her and have been providing essentially total care to her recently.  Her BP chronically runs on the low side.  Her current clinical status doesn't seem far off her baseline actually, so it's reasonable to continue her PD.  Spoke to Jane [name]  her sister who agrees she and husband can continue the current level of care for Jane [name] and would prefer not to switch to HD.  Ongoing discussion as course progresses.

## 2021-09-08 NOTE — Progress Notes (Signed)
OT Cancellation Note  Patient Details Name: Jane [name] MRN: [id number] DOB: 07/21/1966   Cancelled Treatment:    Reason Eval/Treat Not Completed: Patient at procedure or test/ unavailable Xray entering room. Will follow-up for OT treatment as schedule permits  Layla Maw 09/08/2021, 2:33 PM

## 2021-09-08 NOTE — Progress Notes (Signed)
Physical Therapy Treatment Patient Details Name: Jane [name] MRN: [id number] DOB: 1966-02-08 Today's Date: 11/17/2022P   History of Present Illness 55 y/o female admitted 08/27/21 with n/v/d, abdominal pain and cough. Found with sepsis, possible aspiration PNA, and cdiff. PMH includes: ESRD on PD, schizoaffective disorder,cognitive impairment, prior discitis/osteomyelitis/epidural abscess.    PT Comments    Pt was a little more alert and incrementally more participative.  Continues to show lability and cries for no apparent reason or disproportionately to the activity.  Emphasis today on transitions, sitting balance and transfer to the chair with lift padding.    Recommendations for follow up therapy are one component of a multi-disciplinary discharge planning process, led by the attending physician.  Recommendations may be updated based on patient status, additional functional criteria and insurance authorization.  Follow Up Recommendations  Home health PT     Assistance Recommended at Discharge Frequent or constant Supervision/Assistance  Equipment Recommendations  None recommended by PT    Recommendations for Other Services       Precautions / Restrictions Precautions Precautions: Fall Precaution Comments: cortrak     Mobility  Bed Mobility Overal bed mobility: Needs Assistance Bed Mobility: Supine to Sit     Supine to sit: Max assist;+2 for physical assistance     General bed mobility comments: cues for direction and to help pt stay calm.  Still pt seems to cry without any overt reason.    Transfers Overall transfer level: Needs assistance Equipment used: 1 person hand held assist;2 person hand held assist Transfers: Sit to/from Stand;Bed to chair/wheelchair/BSC Sit to Stand: Max assist;+2 safety/equipment Stand pivot transfers: Max assist;+2 physical assistance         General transfer comment: cued for patient to [facility] the therapist and pt was assisted  face to face to stand and pivot to the chair with +2 help for safety    Ambulation/Gait                   Stairs             Wheelchair Mobility    Modified Rankin (Stroke Patients Only)       Balance Overall balance assessment: Needs assistance Sitting-balance support: Single extremity supported;Bilateral upper extremity supported;Feet supported Sitting balance-Leahy Scale: Poor Sitting balance - Comments: sat EOB for ~7 min woking up upright sitting with UE support.  pt started to fatigue and list heavily to the L after about 3 min. needing up to maximal assist to return to upright sitting posture. Postural control: Left lateral lean Standing balance support: Bilateral upper extremity supported;During functional activity Standing balance-Leahy Scale: Zero                              Cognition Arousal/Alertness: Awake/alert Behavior During Therapy: Flat affect Overall Cognitive Status: No family/caregiver present to determine baseline cognitive functioning                                 General Comments: pt with history of cognitive impairments but no family present. Pt able to state she is at the hospital but all other verbalizations more of a moan and difficult to understand with pt following single step commands with repetition and delay        Exercises Other Exercises Other Exercises: bil hip/knee flexion/ext AAROM x10 reps with graded assist/resistance. Other Exercises: bicep/tricep presses  with AA and graded resistance Other Exercises: reach to the ceiling bil x10 reps with AAROM bil.    General Comments        Pertinent Vitals/Pain Pain Assessment: PAINAD Breathing: normal Negative Vocalization: none Facial Expression: sad, frightened, frown Body Language: relaxed Consolability: distracted or reassured by voice/touch PAINAD Score: 2 Pain Location: Unable to indicate specifically. Per sister likely back. Pain  Descriptors / Indicators: Grimacing;Crying Pain Intervention(s): Monitored during session;Repositioned    Home Living                          Prior Function            PT Goals (current goals can now be found in the care plan section) Acute Rehab PT Goals Patient Stated Goal: Pt unable to state PT Goal Formulation: With family Time For Goal Achievement: 09/16/21 Potential to Achieve Goals: Fair Progress towards PT goals: Progressing toward goals    Frequency    Min 3X/week      PT Plan Current plan remains appropriate    Co-evaluation              AM-PAC PT "6 Clicks" Mobility   Outcome Measure  Help needed turning from your back to your side while in a flat bed without using bedrails?: Total Help needed moving from lying on your back to sitting on the side of a flat bed without using bedrails?: Total Help needed moving to and from a bed to a chair (including a wheelchair)?: Total Help needed standing up from a chair using your arms (e.g., wheelchair or bedside chair)?: Total Help needed to walk in hospital room?: Total Help needed climbing 3-5 steps with a railing? : Total 6 Click Score: 6    End of Session   Activity Tolerance: Patient limited by fatigue Patient left: in chair;with call bell/phone within reach;with chair alarm set;Other (comment) (on lift pad.) Nurse Communication: Mobility status PT Visit Diagnosis: Other abnormalities of gait and mobility (R26.89);Muscle weakness (generalized) (M62.81)     Time: 9628-3662 PT Time Calculation (min) (ACUTE ONLY): 26 min  Charges:  $Therapeutic Exercise: 8-22 mins $Therapeutic Activity: 8-22 mins                     09/08/2021  Ginger Carne., PT Acute Rehabilitation Services [phone]  (pager) [phone]  (office)   [name] 09/08/2021, 3:50 PM

## 2021-09-09 LAB — COMPREHENSIVE METABOLIC PANEL
ALT: 10 U/L (ref 0–44)
AST: 15 U/L (ref 15–41)
Albumin: <1.5 g/dL — ABNORMAL LOW (ref 3.5–5.0)
Alkaline Phosphatase: 151 U/L — ABNORMAL HIGH (ref 38–126)
Anion gap: 11 (ref 5–15)
BUN: 47 mg/dL — ABNORMAL HIGH (ref 6–20)
CO2: 25 mmol/L (ref 22–32)
Calcium: 9.8 mg/dL (ref 8.9–10.3)
Chloride: 90 mmol/L — ABNORMAL LOW (ref 98–111)
Creatinine, Ser: 4.26 mg/dL — ABNORMAL HIGH (ref 0.44–1.00)
GFR, Estimated: 12 mL/min — ABNORMAL LOW (ref >60)
Glucose, Bld: 104 mg/dL — ABNORMAL HIGH (ref 70–99)
Potassium: 4.1 mmol/L (ref 3.5–5.1)
Sodium: 126 mmol/L — ABNORMAL LOW (ref 135–145)
Total Bilirubin: 0.5 mg/dL (ref 0.3–1.2)
Total Protein: 5.5 g/dL — ABNORMAL LOW (ref 6.5–8.1)

## 2021-09-09 LAB — CBC
HCT: 22.8 % — ABNORMAL LOW (ref 36.0–46.0)
Hemoglobin: 7.5 g/dL — ABNORMAL LOW (ref 12.0–15.0)
MCH: 28.5 pg (ref 26.0–34.0)
MCHC: 32.9 g/dL (ref 30.0–36.0)
MCV: 86.7 fL (ref 80.0–100.0)
Platelets: 278 10*3/uL (ref 150–400)
RBC: 2.63 MIL/uL — ABNORMAL LOW (ref 3.87–5.11)
RDW: 24.3 % — ABNORMAL HIGH (ref 11.5–15.5)
WBC: 7 10*3/uL (ref 4.0–10.5)
nRBC: 0.3 % — ABNORMAL HIGH (ref 0.0–0.2)

## 2021-09-09 LAB — GLUCOSE, CAPILLARY
Glucose-Capillary: 102 mg/dL — ABNORMAL HIGH (ref 70–99)
Glucose-Capillary: 93 mg/dL (ref 70–99)
Glucose-Capillary: 93 mg/dL (ref 70–99)

## 2021-09-09 MED ORDER — HEPARIN 1000 UNIT/ML FOR PERITONEAL DIALYSIS
INTRAPERITONEAL | Status: DC
  Filled 2021-09-09 (×4): qty 5000

## 2021-09-09 MED ORDER — DARBEPOETIN ALFA 150 MCG/0.3ML IJ SOSY: 150.0000 ug | PREFILLED_SYRINGE | INTRAMUSCULAR | Status: DC

## 2021-09-09 MED ORDER — DELFLEX-LC/2.5% DEXTROSE 394 MOSM/L IP SOLN: INTRAPERITONEAL | Status: DC

## 2021-09-09 NOTE — Progress Notes (Addendum)
PROGRESS NOTE    SYMPHANI ECKSTROM  RJJ:[id number] DOB: 1966-03-15 DOA: 08/26/2021 PCP: [name], MD   Brief Narrative/Hospital Course:  [name], 55 y.o. female with past medical history of end-stage renal disease on peritoneal dialysis, schizoaffective disorder, history of osteomyelitis, epidural abscess presented to hospital with nausea, vomiting, diarrhea, abdominal pain and cough.  At baseline, patient is wheelchair-bound and has been dealing with disabling psych issues.  In the ED, patient had hypotension, acute encephalopathy and was admitted hospital for septic shock, aspiration pneumonia, acute metabolic encephalopathy initially to the ICU.  Patient was seen by nephrology and ID.  CT scan of the abdomen pelvis showed pneumonia and small ascites.  Patient was treated for spontaneous bacterial peritonitis secondary to MRSE from PD catheter and aspiration pneumonia.    During hospitalization, patient was put on enteral tube feeding and TPN due to poor oral intake and ileus  She did have a prolonged stay in the hospital due to this.  At this time, ileus has been resolving.  TPN has been kept on hold since 11/18 and tube feeding has been reinitiated.  Due to peritoneal dialysis catheter unable to put the PEG tube.  Patient's sister wishes to feed her by mouth on discharge.  Assessment & Plan:  Septic shock due to spontaneous bacterial peritonitis secondary to peritoneal dialysis catheter associated infection/Aspiration pneumonia, ID had recommended 2 weeks of IV vancomycin which has been completed at this time.  On nystatin p.o. 4 times daily for fungal peritonitis prophylaxis.  Will be discontinued as well.  Continue midodrine.  New fever with tachycardia.  1 episode of fever of 100.4 F on 09/08/2021.  Has improved at this time.  T-max of 98.8 F over the last 24 hours.  Abdominal x-ray improved ileus..  Patient has completed IV vancomycin and nystatin.  Repeat blood  cultures have been sent on 09/08/2021 which is pending at this time.  No leukocytosis or further fever.  Chest x-ray without any infiltrate.  Continue to observe off antibiotic.  Poor feeding tube tolerance.   Patient was briefly put on TPN due to poor oral intolerance and nutritional deficits.  Ileus has improved and nutritionist advancing PEG tube feeding to meet nutritional demands.  Hold off with TPN at this time.   ESRD on PD:  Nephrology on board for peritoneal dialysis.  We will continue peritoneal dialysis on discharge.  Hypokalemia  Latest potassium of 4.1.  Check BMP in AM.  Chronic mild hyponatremia:  Latest sodium of 126   Hypoglycemia:  Poor oral intake.  Currently on tube feeding.  Blood glucose levels have improved.   Sinus tachycardia/borderline hypotension. Continue midodrine.  Dose was increased to 10 mg 3 times daily from 5 mg 3 times daily.  Blood pressure has improved at this time but patient does have mild sinus tachycardia..  Diarrhea with C. difficile antigen positive toxin negative, indeterminate results.  Patient was on rectal tube which has been removed.  This has greatly improved at this time.    We will try to avoid antimotility agents due to recently ileus.    MRSE and GPR positive in 1/2 sets felt to be contaminant.  Repeat blood cultures were sent on 09/08/2021 pending  Acute metabolic encephalopathy: Multiple cognitive and psychiatric comorbidities.  Patient is likely at her baseline at this time.  Schizoaffective disorder/bipolar disorder/Multiple cognitive and psychiatric comorbidities: Patient is on Cogentin, fluoxetine, Risperdal, Keppra at home. Cont supportive care more alert awake and mildly communicative.  Tearful at times.  Hypothyroidism: Continue Synthroid   History of discitis/epidural abscess  Status postlaminectomy on 01/25/2021 with MSSA and completed 8 weeks of antibiotic.  Patient is wheelchair bound at this time.  Needing total assist  and care.   Anemia- Likely multifactorial from chronic kidney disease, anemia of chronic disease, occult blood loss..  Patient was FOBT positive on 11/ 5 but hemoglobin has been stable since then.   FOBT positive 11/5, although no evidence of active GI bleeding.  Latest hemoglobin of 7.5.  Patient has had multiple blood draws.  No mention of overt GI bleed.  Repeat CBC in AM.  Continue Protonix.  Might need GI work-up in the future when more stable clinically, if downtrending hemoglobin.  Moderate protein calorie malnutrition, present on admission.  Seen by dietitian.  Briefly received TPN.  Currently on tube feeding and TPN has been discontinued.  Pressure injury of right sacrum stage II POA  continue local wound care  Pressure Injury 01/25/21 Sacrum Right;Left Stage 2 -  Partial thickness loss of dermis presenting as a shallow open injury with a red, pink wound bed without slough. (Active)  01/25/21 1951  Location: Sacrum  Location Orientation: Right;Left  Staging: Stage 2 -  Partial thickness loss of dermis presenting as a shallow open injury with a red, pink wound bed without slough. (old pressure injury that is healing)  Wound Description (Comments):   Present on Admission: Yes   DVT prophylaxis: heparin injection 5,000 Units Start: 08/30/21 1400    Code Status: Full Code  Family Communication:  Spoke with the patient's sister on the phone on 09/09/2021 and updated her about the clinical condition of the patient.  Patient's her sister stated that she eats better when she is around and does not wish PEG tube placement.  Additionally PEG tube is not possible when on peritoneal dialysis catheter.  Status is: Inpatient  Remains inpatient appropriate because: Ongoing management of sepsis, poor oral intake, ileus, tube feeding,  need for TPN.  Disposition. Likely home tomorrow if patient continues to tolerate tube feeding, no spike of fever, negative cultures, hemoglobin remains stable.   I have spoken with the patient's her sister about potential discharge tomorrow if things were stable.  Subjective: Today, patient was seen and examined at bedside.  Patient has been tolerating PEG tube feeding.  No further fevers noted.  No nausea vomiting or diarrhea.  Objective:  Vitals:   09/09/21 0315 09/09/21 0640 09/09/21 0751 09/09/21 0814  BP: 127/72  104/60 116/67  Pulse: (!) 113  (!) 115 (!) 109  Resp: 18  18 (!) 28  Temp: 98.4 F (36.9 C)  98.8 F (37.1 C) 98.7 F (37.1 C)  TempSrc: Axillary  Axillary Axillary  SpO2: 95%  98% 99%  Weight:  64.5 kg  61.6 kg  Height:       Weight change: -1.7 kg  Intake/Output Summary (Last 24 hours) at 09/09/2021 1057 Last data filed at 09/09/2021 0849 Gross per 24 hour  Intake 1859.75 ml  Output 998 ml  Net 861.75 ml    Net IO Since Admission: -3,125.56 mL [09/09/21 1057]   Physical Examination: General:   Patient appears frail.,  Deconditioned, tearful at times.  Not in obvious distress.  Alert awake and mildly communicative. HENT:   No scleral pallor or icterus noted. Oral mucosa is moist.  Cortrak tube in place. Chest:   Diminished breath sounds bilaterally.  No obvious rhonchi CVS: S1 &S2 heard. No murmur.  Regular  rate and rhythm. Abdomen: Soft, nontender, nondistended.  Bowel sounds are heard.  Peritoneal dialysis catheter in place. Extremities: No cyanosis, clubbing or edema.  Peripheral pulses are palpable. Psych: Alert, awake and mildly verbal, tearful, anxious mood. CNS: Moves extremities.  Alert awake Skin: Warm and dry.  No rashes noted.  Medications reviewed:  Scheduled Meds:  benztropine  1 mg Per Tube QHS   Chlorhexidine Gluconate Cloth  6 each Topical Daily   darbepoetin (ARANESP) injection - DIALYSIS  40 mcg Subcutaneous Q Tue-1800   feeding supplement (PROSource TF)  45 mL Per Tube BID   FLUoxetine  20 mg Per Tube Daily   [START ON 09/10/2021] free water  175 mL Per Tube Q4H   gentamicin cream  1  application Topical Daily   heparin injection (subcutaneous)  5,000 Units Subcutaneous Q8H   levETIRAcetam  500 mg Per Tube BID   levothyroxine  50 mcg Per Tube Q breakfast   midodrine  10 mg Per Tube TID WC   nystatin  5 mL Per Tube QID   pantoprazole sodium  40 mg Per Tube Daily   risperiDONE  3 mg Per Tube QHS   sodium chloride flush  10-40 mL Intracatheter Q12H   sodium chloride flush  10-40 mL Intracatheter Q12H   sodium chloride flush  3 mL Intravenous Q12H   valproic acid  500 mg Per Tube BID   Continuous Infusions:  sodium chloride Stopped (08/31/21 1917)   dialysis solution 2.5% low-MG/low-CA     feeding supplement (VITAL 1.5 CAL) 1,000 mL (09/08/21 1526)   TPN ADULT (ION) 30 mL/hr at 09/08/21 1759   Consultants: Nephrology,  infectious disease  Procedures: Cortrak tube tube placement Peritoneal dialysis PICC line placement.  Antimicrobials:  Anti-infectives (From admission, onward)    Start     Dose/Rate Route Frequency Ordered Stop   09/03/21 1500  vancomycin (VANCOREADY) IVPB 1000 mg/200 mL        1,000 mg 200 mL/hr over 60 Minutes Intravenous  Once 09/02/21 1534 09/04/21 0816   09/02/21 2008  vancomycin variable dose per unstable renal function (pharmacist dosing)  Status:  Discontinued         Does not apply See admin instructions 09/02/21 2008 09/09/21 0858   08/30/21 1100  vancomycin (VANCOREADY) IVPB 1000 mg/200 mL  Status:  Discontinued        1,000 mg 200 mL/hr over 60 Minutes Intravenous every 72 hours 08/30/21 0928 09/02/21 1534   08/29/21 2200  Ampicillin-Sulbactam (UNASYN) 3 g in sodium chloride 0.9 % 100 mL IVPB        3 g 200 mL/hr over 30 Minutes Intravenous Every 24 hours 08/29/21 1239 09/02/21 2255   08/28/21 2200  ceFEPIme (MAXIPIME) 2 g in sodium chloride 0.9 % 100 mL IVPB  Status:  Discontinued        2 g 200 mL/hr over 30 Minutes Intravenous Every 48 hours 08/27/21 0348 08/29/21 1239   08/28/21 0051  vancomycin variable dose per unstable  renal function (pharmacist dosing)  Status:  Discontinued         Does not apply See admin instructions 08/28/21 0052 08/30/21 0936   08/27/21 0330  metroNIDAZOLE (FLAGYL) IVPB 500 mg  Status:  Discontinued        500 mg 100 mL/hr over 60 Minutes Intravenous Every 12 hours 08/27/21 0328 08/29/21 1239   08/27/21 0315  vancomycin (VANCOREADY) IVPB 1500 mg/300 mL        1,500 mg 150 mL/hr over  120 Minutes Intravenous  Once 08/27/21 0303 08/27/21 0654   08/27/21 0215  ceFEPIme (MAXIPIME) 2 g in sodium chloride 0.9 % 100 mL IVPB        2 g 200 mL/hr over 30 Minutes Intravenous  Once 08/27/21 0202 08/27/21 0327      Culture/Microbiology    Component Value Date/Time   SDES BLOOD RIGHT HAND 08/29/2021 1233   SPECREQUEST  08/29/2021 1233    BOTTLES DRAWN AEROBIC AND ANAEROBIC Blood Culture adequate volume   CULT  08/29/2021 1233    NO GROWTH 5 DAYS Performed at [facility], [place] [address], [place], [place] [address]    REPTSTATUS 09/03/2021 FINAL 08/29/2021 1233      Data Reviewed: I have personally reviewed the following labs and imaging studies.    CBC: Recent Labs  Lab 09/03/21 0153 09/04/21 0210 09/05/21 0406 09/06/21 0315 09/09/21 0417  WBC 5.0 6.2 6.6 6.7 7.0  HGB 9.3* 9.4* 9.1* 8.9* 7.5*  HCT 28.4* 28.6* 27.9* 27.2* 22.8*  MCV 86.1 86.9 86.9 86.1 86.7  PLT 132* 117* 120* 136* 767    Basic Metabolic Panel: Recent Labs  Lab 09/03/21 0153 09/04/21 0210 09/05/21 0406 09/06/21 0315 09/07/21 0515 09/08/21 0421 09/09/21 0417  NA 128*   < > 129* 127* 128* 127* 126*  K 3.4*   < > 4.5 4.6 4.2 4.3 4.1  CL 95*   < > 95* 90* 92* 90* 90*  CO2 19*   < > 21* 21* 24 26 25   GLUCOSE 119*   < > 93 98 140* 111* 104*  BUN 32*   < > 38* 46* 43* 48* 47*  CREATININE 4.22*   < > 4.21* 4.32* 4.37* 4.42* 4.26*  CALCIUM 8.9   < > 9.4 9.3 9.1 9.6 9.8  MG 1.7  --  1.7 1.7 1.9 1.8  --   PHOS 3.7  --  3.7 4.1 3.1 2.5  --    < > = values in this interval not displayed.     GFR: Estimated Creatinine Clearance: 13 mL/min (A) (by C-G formula based on SCr of 4.26 mg/dL (H)). Liver Function Tests: Recent Labs  Lab 09/05/21 0406 09/06/21 0315 09/07/21 0515 09/08/21 0421 09/09/21 0417  AST 8* 14* 12* 14* 15  ALT 9 6 9 10 10   ALKPHOS 153* 160* 174* 160* 151*  BILITOT 0.2* 0.6 0.6 0.5 0.5  PROT 5.0* 5.2* 5.2* 5.2* 5.5*  ALBUMIN <1.5* <1.5* <1.5* <1.5* <1.5*    No results for input(s): LIPASE, AMYLASE in the last 168 hours.  No results for input(s): AMMONIA in the last 168 hours. Coagulation Profile: No results for input(s): INR, PROTIME in the last 168 hours. Cardiac Enzymes: No results for input(s): CKTOTAL, CKMB, CKMBINDEX, TROPONINI in the last 168 hours. BNP (last 3 results) No results for input(s): PROBNP in the last 8760 hours. HbA1C: No results for input(s): HGBA1C in the last 72 hours. CBG: Recent Labs  Lab 09/08/21 0729 09/08/21 1129 09/08/21 1630 09/08/21 2002 09/09/21 0752  GLUCAP 103* 95 89 109* 102*    Lipid Profile: No results for input(s): CHOL, HDL, LDLCALC, TRIG, CHOLHDL, LDLDIRECT in the last 72 hours.  Thyroid Function Tests: No results for input(s): TSH, T4TOTAL, FREET4, T3FREE, THYROIDAB in the last 72 hours. Anemia Panel: No results for input(s): VITAMINB12, FOLATE, FERRITIN, TIBC, IRON, RETICCTPCT in the last 72 hours.  Sepsis Labs: No results for input(s): PROCALCITON, LATICACIDVEN in the last 168 hours.   No results found  for this or any previous visit (from the past 240 hour(s)).    Radiology Studies: DG Abd 1 View  Result Date: 09/08/2021 CLINICAL DATA:  55 year old female on peritoneal dialysis. Query ileus. EXAM: ABDOMEN - 1 VIEW COMPARISON:  09/07/2021 and earlier. FINDINGS: Portable AP supine view at 1024 hours. Stable enteric feeding tube, tip at the gastric antrum. Non obstructed bowel gas pattern, improved since 09/04/2021. Peritoneal dialysis catheter again projects over the lower abdomen.  Grossly negative lung bases. No acute osseous abnormality identified. IMPRESSION: 1. Non obstructed bowel gas pattern, improved since 09/04/2021. Doubt ileus. 2. Stable enteric feeding tube, peritoneal dialysis catheter. Electronically Signed   By: Genevie Ann M.D.   On: 09/08/2021 10:41   DG CHEST PORT 1 VIEW  Result Date: 09/08/2021 CLINICAL DATA:  Fever. EXAM: PORTABLE CHEST 1 VIEW COMPARISON:  Chest x-ray 08/27/2021. FINDINGS: Enteric tube extends below the diaphragm. Right-sided central venous catheter tip projects over the brachiocephalic SVC junction. There is no lung consolidation, pleural effusion or pneumothorax. Cardiomediastinal silhouette is within normal limits. No acute fractures are seen. Chronic changes of the right shoulder are similar to the prior exam. IMPRESSION: 1. No active disease. 2. Right sided central venous catheter tip projects over the brachiocephalic SVC junction. 3. Enteric tube extends below the diaphragm. Electronically Signed   By: Ronney Asters M.D.   On: 09/08/2021 15:12     LOS: 13 days   Flora Lipps, MD [facility] 09/09/2021, 10:57 AM

## 2021-09-09 NOTE — Plan of Care (Signed)

## 2021-09-09 NOTE — Progress Notes (Signed)
Mobility Specialist Progress Note:   09/09/21 1050  Mobility  Activity Dangled on edge of bed  Level of Assistance Maximum assist, patient does 25-49% (+2)  Assistive Device None  Mobility Sit up in bed/chair position for meals  Mobility Response Tolerated fair  Mobility performed by Mobility specialist  $Mobility charge 1 Mobility   Pt required maxA of 2 to sit EOB today. Performed passive ROM exercises, pt expressed pain in LLE. Required maxA for [facility]. Pt emotional during session. Left in bed >30 degrees, and tube feed resumed.   Nelta Numbers Mobility Specialist  Phone [phone]

## 2021-09-09 NOTE — Progress Notes (Signed)
[name] Progress Note   Subjective:  Seen in room - no CP or dyspnea. Per notes, was febrile yesterday and Blood Cx re-collected. Finished PD this morning, per RN there was fibrin in her effluent, but otherwise looked ok without signs of infection. Still with some abdominal pain. Per notes, will be stopping TPN and resuming tube feeds.  Objective Vitals:   09/09/21 0315 09/09/21 0640 09/09/21 0751 09/09/21 0814  BP: 127/72  104/60 116/67  Pulse: (!) 113  (!) 115 (!) 109  Resp: 18  18 (!) 28  Temp: 98.4 F (36.9 C)  98.8 F (37.1 C) 98.7 F (37.1 C)  TempSrc: Axillary  Axillary Axillary  SpO2: 95%  98% 99%  Weight:  64.5 kg  61.6 kg  Height:       Physical Exam General: Chronically ill appearing woman, NAD. NG tube in place. Heart: RRR; 2/6 murmur Lungs: CTA anteroirly Abdomen: soft, mildly tender but without guarding. PD exit site looks fine per dialysis RN Extremities: No LE edema Dialysis Access: PD cath  Additional Objective Labs: Basic Metabolic Panel: Recent Labs  Lab 09/06/21 0315 09/07/21 0515 09/08/21 0421 09/09/21 0417  NA 127* 128* 127* 126*  K 4.6 4.2 4.3 4.1  CL 90* 92* 90* 90*  CO2 21* 24 26 25   GLUCOSE 98 140* 111* 104*  BUN 46* 43* 48* 47*  CREATININE 4.32* 4.37* 4.42* 4.26*  CALCIUM 9.3 9.1 9.6 9.8  PHOS 4.1 3.1 2.5  --    Liver Function Tests: Recent Labs  Lab 09/07/21 0515 09/08/21 0421 09/09/21 0417  AST 12* 14* 15  ALT 9 10 10   ALKPHOS 174* 160* 151*  BILITOT 0.6 0.5 0.5  PROT 5.2* 5.2* 5.5*  ALBUMIN <1.5* <1.5* <1.5*   CBC: Recent Labs  Lab 09/03/21 0153 09/04/21 0210 09/05/21 0406 09/06/21 0315 09/09/21 0417  WBC 5.0 6.2 6.6 6.7 7.0  HGB 9.3* 9.4* 9.1* 8.9* 7.5*  HCT 28.4* 28.6* 27.9* 27.2* 22.8*  MCV 86.1 86.9 86.9 86.1 86.7  PLT 132* 117* 120* 136* 278   Studies/Results: DG Abd 1 View  Result Date: 09/08/2021 CLINICAL DATA:  55 year old female on peritoneal dialysis. Query ileus. EXAM: ABDOMEN -  1 VIEW COMPARISON:  09/07/2021 and earlier. FINDINGS: Portable AP supine view at 1024 hours. Stable enteric feeding tube, tip at the gastric antrum. Non obstructed bowel gas pattern, improved since 09/04/2021. Peritoneal dialysis catheter again projects over the lower abdomen. Grossly negative lung bases. No acute osseous abnormality identified. IMPRESSION: 1. Non obstructed bowel gas pattern, improved since 09/04/2021. Doubt ileus. 2. Stable enteric feeding tube, peritoneal dialysis catheter. Electronically Signed   By: Genevie Ann M.D.   On: 09/08/2021 10:41   DG CHEST PORT 1 VIEW  Result Date: 09/08/2021 CLINICAL DATA:  Fever. EXAM: PORTABLE CHEST 1 VIEW COMPARISON:  Chest x-ray 08/27/2021. FINDINGS: Enteric tube extends below the diaphragm. Right-sided central venous catheter tip projects over the brachiocephalic SVC junction. There is no lung consolidation, pleural effusion or pneumothorax. Cardiomediastinal silhouette is within normal limits. No acute fractures are seen. Chronic changes of the right shoulder are similar to the prior exam. IMPRESSION: 1. No active disease. 2. Right sided central venous catheter tip projects over the brachiocephalic SVC junction. 3. Enteric tube extends below the diaphragm. Electronically Signed   By: Ronney Asters M.D.   On: 09/08/2021 15:12    Medications:  sodium chloride Stopped (08/31/21 1917)   dialysis solution 2.5% low-MG/low-CA     feeding supplement (VITAL 1.5  CAL) 1,000 mL (09/08/21 1526)   TPN ADULT (ION) 30 mL/hr at 09/08/21 1759    benztropine  1 mg Per Tube QHS   Chlorhexidine Gluconate Cloth  6 each Topical Daily   darbepoetin (ARANESP) injection - DIALYSIS  40 mcg Subcutaneous Q Tue-1800   feeding supplement (PROSource TF)  45 mL Per Tube BID   FLUoxetine  20 mg Per Tube Daily   [START ON 09/10/2021] free water  175 mL Per Tube Q4H   gentamicin cream  1 application Topical Daily   heparin injection (subcutaneous)  5,000 Units Subcutaneous Q8H    levETIRAcetam  500 mg Per Tube BID   levothyroxine  50 mcg Per Tube Q breakfast   midodrine  10 mg Per Tube TID WC   nystatin  5 mL Per Tube QID   pantoprazole sodium  40 mg Per Tube Daily   risperiDONE  3 mg Per Tube QHS   sodium chloride flush  10-40 mL Intracatheter Q12H   sodium chloride flush  10-40 mL Intracatheter Q12H   sodium chloride flush  3 mL Intravenous Q12H   valproic acid  500 mg Per Tube BID    Dialysis Orders: [facility]  [facility] [phone] (HT not open on weekends)  OP Nephrologist: Dimas Chyle  Ambulatory PD  7x/week  4 exchanges 2L   08/31/21 - Dr Johnney Ou spoke to RN at her dialysis clinic - has 2 family members (sister and BIL) who do her PD treatments for her and have been providing essentially total care to her recently.  Her BP chronically runs on the low side.  Her current clinical status doesn't seem far off her baseline actually, so it's reasonable to continue her PD.  Spoke to [employer]  her sister who agrees she and husband can continue the current level of care for [name] and would prefer not to switch to HD.  Ongoing discussion as course progresses.      Assessment/Plan: Sepsis: Felt due to aspiration PNA and PD cath related peritonitis. Aspiration PNA, Blood Cx + felt to be contaminant, ID signed off. Finished course of IV Unasyn for pneumonia. Diarrhea/ vomiting - rectal tube in place, C-diff Ag+, toxin negative. Conservative Rx.  Malnutrition - dysphagia diet rec'd by nutrition. Alb < 1.5. TPN started 11/15, running at 50% goal ~30 cc/hr. Transitioning back to tube feeds today. MRSE PD cath related acute peritonitis: Cell count significantly improved 1099 -> 221 on 11/7. No signs of tunnel infection. Per dialysis RN, effluent looked ok except with fibrin this morning. Has been on 2-week course of Vancomycin, ending today. Will check cell count in AM to assure normalized. Hypotension/volume - s/p pressors, now on midodrine 10mg  TID.  BPs are low-  normal now.  ESRD - continue CCPD nightly. Initially on all 4.25% and getting > 1.5L/nightly, now on 2.5%. Net 920mL off last night. Adding heparin to bags for fibrin tonight. Anemia  - Hgb trending down. Started ESA (Aranesp 5mcg q Tues) - ^ next dose. Metabolic bone disease -  Corr Ca/Phos ok.  Nutrition - Alb < 1.5, incredibly low. Was on TPN, now back to tube feeding. Mental retardation Seizure d/o Schizoaffective/Bipolar disorder - per primary  Fever 11/17: Repeat Blood Cx drawn, will get PD fluid cell count. CXR clear.  Veneta Penton, PA-C 09/09/2021, 10:48 AM  [employer]

## 2021-09-09 NOTE — Progress Notes (Signed)
PHARMACY - TOTAL PARENTERAL NUTRITION CONSULT NOTE   Indication: Prolonged ileus and Intolerance to enteral feeding   Patient Measurements: Height: 5\' 4"  (162.6 cm) Weight: 61.6 kg (135 lb 12.9 oz) IBW/kg (Calculated) : 54.7 TPN AdjBW (KG): 62.5 Body mass index is 23.31 kg/m. Admit weight 62.5 kg  Assessment: 55 year old female who presented 08/26/21 to the ED with 1 week history of nausea, vomiting, and diarrhea. PMH most notable for ESRD on PD at home and history of discitis/osteomyelitis with epidural abscess in April 2022. Patient was admitted with sepsis, aspiration pneumonia, and peritonitis and transferred to ICU on 11/5 for septic shock. Septic shock resolved, transferred to progressive level of care, and patient receiving antibiotic treatment for MRSA bacteremia and peritonitis. Enteral nutrition in form of tube feeds via Cortrak initiated on 08/31/21 at trickle rate. Patient had vomiting 11/20 and tube feeds were held with KUB showing ileus. Reglan was initiated and tube feeds re-attempted at trickle rate however patient subsequently had projectile vomiting on 11/13 and tube feeds were held again. At 11 days post admission and pharmacy consulted to initiate TPN for chronic malnutrition with persistent ileus and inability to tolerate enteral nutrition.   Glucose / Insulin: BG < 120, A1c (April) 5.6, no insulin required Electrolytes: Na/Cl low but stable (126/90),  unable to calculate CoCa with albumin <1.5. Others wnl  Renal: CCPD nightly continued, bun 47 Hepatic: LFTs low-normal. Tbili within normal limits. Triglycerides 161 Intake / Output; MIVF: no edema per note, CCPD out 1258, LBM 11/17 GI Imaging:  11/5 CT Abd - Thickened appearance of the small bowel likely related to ascites. Enteritis is less likely. No bowel obstruction.  11/10 Abd xray - possible adynamic SB ileus vs partial SBO 11/13 Abd Xray  - slight decrease in gaseous distention, suggestive of possibly improving  ileus 11/16 Abd Xray - slight decreased gaseous distention 11/18 Abd Xray -improved, doubt ileus  GI Surgeries / Procedures: none  Central access: Double lumen PICC (11/14) TPN start date: 09/06/21  Nutritional Goals: Goal TPN rate is 58 mL/hr (provides 90 g of protein and 1911 kcals per day)  RD Assessment: Estimated Needs Total Energy Estimated Needs: 1900-2100 Total Protein Estimated Needs: 90-110 grams Total Fluid Estimated Needs: 1000 ml + UOP  Current Nutrition:  TPN- at half rate  Tube feeding via Cortrak - tolerating goal 55 ml/hr   Dysphagia 2 Diet - No oral intake per RN  Plan:  Stop TPN- taper to half rate at 1700 then stop at [place], PharmD, [place], [place] Pharmacist  Please check AMION for all [place] phone numbers After 10:00 PM, call [place]

## 2021-09-09 NOTE — Plan of Care (Signed)
  Problem: Health Behavior/Discharge Planning: Goal: Ability to manage health-related needs will improve Outcome: Progressing   Problem: Clinical Measurements: Goal: Ability to maintain clinical measurements within normal limits will improve Outcome: Progressing Goal: Will remain free from infection Outcome: Progressing Goal: Diagnostic test results will improve Outcome: Progressing   Problem: Activity: Goal: Risk for activity intolerance will decrease Outcome: Progressing   Problem: Elimination: Goal: Will not experience complications related to bowel motility Outcome: Progressing

## 2021-09-09 NOTE — Progress Notes (Signed)
Occupational Therapy Treatment Patient Details Name: Jane [name] MRN: [id number] DOB: 1965-10-29 Today's Date: 09/09/2021   History of present illness 55 y/o female admitted 08/27/21 with n/v/d, abdominal pain and cough. Found with sepsis, possible aspiration PNA, and cdiff. PMH includes: ESRD on PD, schizoaffective disorder,cognitive impairment, prior discitis/osteomyelitis/epidural abscess.   OT comments  This 55 yo female admitted with above seen today to see if we could focus on self feeding and grooming. Pt not wanting to eat anything today to worked on grooming and bed level exercises. Pt will continue to benefit from acute OT with follow up [place] and hopefully will do better when she is back in her own environment and routine.   Recommendations for follow up therapy are one component of a multi-disciplinary discharge planning process, led by the attending physician.  Recommendations may be updated based on patient status, additional functional criteria and insurance authorization.    Follow Up Recommendations  Home health OT    Assistance Recommended at Discharge Frequent or constant Supervision/Assistance  Equipment Recommendations  None recommended by OT       Precautions / Restrictions Precautions Precautions: Fall Precaution Comments: cortrak Restrictions Weight Bearing Restrictions: No              ADL either performed or assessed with clinical judgement   ADL Overall ADL's : Needs assistance/impaired       Grooming Details (indicate cue type and reason): wash face--max A for throughness, lotion hands-min A supine in bed, she was able to take swab with dry mouth gel on it and swipe on lips (maX A for throughness)                                    Extremity/Trunk Assessment Upper Extremity Assessment Upper Extremity Assessment: LUE deficits/detail LUE Deficits / Details: affected by prior CVA, less PROM noted especially at shoulder             Vision   Additional Comments: tends to keep eyes closed          Cognition Arousal/Alertness: Awake/alert Behavior During Therapy: Flat affect Overall Cognitive Status: No family/caregiver present to determine baseline cognitive functioning                                 General Comments: pt with history of cognitive impairments but no family present. Pt able to state she did not want anything to eat or drink, told me her sister was Jane [name], moaning/crying out at other times when asked to do something or a question          Exercises Other Exercises Other Exercises: Bil UE shoulder and elbow exercised while supine in bed (pt able to A more with RUE than LUE), PROM to Bil LEs for ankle flexion/extension and knee flexion/extension with pt moaning/crying out intermtittently with LLE and RLE.           Pertinent Vitals/ Pain       Pain Assessment: PAINAD Breathing: normal Negative Vocalization: occasional moan/groan, low speech, negative/disapproving quality Facial Expression: sad, frightened, frown Body Language: relaxed Consolability: distracted or reassured by voice/touch PAINAD Score: 3 Pain Location: Unable to indicate specifically. Pain Descriptors / Indicators: Moaning Pain Intervention(s): Limited activity within patient's tolerance;Repositioned         Frequency  Min 2X/week  Progress Toward Goals  OT Goals(current goals can now be found in the care plan section)  Progress towards OT goals: Not progressing toward goals - comment (remains at same level as eval, per RN pt does better with family present)  Acute Rehab OT Goals Patient Stated Goal: unable to state (other than did not want anything to eat or drink) OT Goal Formulation: Patient unable to participate in goal setting Time For Goal Achievement: 09/16/21 Potential to Achieve Goals: Good  Plan Discharge plan remains appropriate       AM-PAC OT "6 Clicks" Daily Activity      Outcome Measure   Help from another person eating meals?: Total Help from another person taking care of personal grooming?: Total (<25%) Help from another person toileting, which includes using toliet, bedpan, or urinal?: Total Help from another person bathing (including washing, rinsing, drying)?: Total Help from another person to put on and taking off regular upper body clothing?: Total Help from another person to put on and taking off regular lower body clothing?: Total 6 Click Score: 6    End of Session    OT Visit Diagnosis: Other abnormalities of gait and mobility (R26.89);Muscle weakness (generalized) (M62.81);Other symptoms and signs involving cognitive function;Cognitive communication deficit (R41.841)   Activity Tolerance Patient tolerated treatment well   Patient Left in bed;with call bell/phone within reach;with bed alarm set           Time: 5956-3875 OT Time Calculation (min): 14 min  Charges: OT General Charges $OT Visit: 1 Visit OT Treatments $Self Care/Home Management : 8-22 mins  [place], OTR/L Acute [employer] Pager [phone] Office [phone]    Almon Register 09/09/2021, 12:05 PM

## 2021-09-10 DIAGNOSIS — N185 Chronic kidney disease, stage 5: Secondary | ICD-10-CM

## 2021-09-10 DIAGNOSIS — Z515 Encounter for palliative care: Secondary | ICD-10-CM

## 2021-09-10 DIAGNOSIS — R627 Adult failure to thrive: Secondary | ICD-10-CM

## 2021-09-10 DIAGNOSIS — D631 Anemia in chronic kidney disease: Secondary | ICD-10-CM

## 2021-09-10 LAB — BODY FLUID CELL COUNT WITH DIFFERENTIAL: Total Nucleated Cell Count, Fluid: 1 cu mm (ref 0–1000)

## 2021-09-10 LAB — CBC
HCT: 21.6 % — ABNORMAL LOW (ref 36.0–46.0)
Hemoglobin: 7 g/dL — ABNORMAL LOW (ref 12.0–15.0)
MCH: 28 pg (ref 26.0–34.0)
MCHC: 32.4 g/dL (ref 30.0–36.0)
MCV: 86.4 fL (ref 80.0–100.0)
Platelets: 326 10*3/uL (ref 150–400)
RBC: 2.5 MIL/uL — ABNORMAL LOW (ref 3.87–5.11)
RDW: 24 % — ABNORMAL HIGH (ref 11.5–15.5)
WBC: 6.7 10*3/uL (ref 4.0–10.5)
nRBC: 0 % (ref 0.0–0.2)

## 2021-09-10 MED ORDER — DELFLEX-LC/2.5% DEXTROSE 394 MOSM/L IP SOLN: INTRAPERITONEAL | Status: DC

## 2021-09-10 MED ORDER — PROSOURCE TF PO LIQD
45.0000 mL | Freq: Two times a day (BID) | ORAL | Status: DC
  Administered 2021-09-10 – 2021-09-11 (×2): 45 mL
  Filled 2021-09-10 (×3): qty 45

## 2021-09-10 MED ORDER — FREE WATER
175.0000 mL | Status: DC
  Administered 2021-09-10 – 2021-09-11 (×5): 175 mL

## 2021-09-10 MED ORDER — NEPRO/CARBSTEADY PO LIQD
237.0000 mL | Freq: Two times a day (BID) | ORAL | Status: DC
  Administered 2021-09-11: 237 mL via ORAL

## 2021-09-10 MED ORDER — VITAL 1.5 CAL PO LIQD
1000.0000 mL | ORAL | Status: DC
  Administered 2021-09-10 – 2021-09-11 (×2): 1000 mL
  Filled 2021-09-10 (×3): qty 1000

## 2021-09-10 MED ORDER — DELFLEX-LC/1.5% DEXTROSE 344 MOSM/L IP SOLN: INTRAPERITONEAL | Status: DC

## 2021-09-10 MED ORDER — RENA-VITE PO TABS
1.0000 | ORAL_TABLET | Freq: Every day | ORAL | Status: DC
  Administered 2021-09-10: 1 via ORAL
  Filled 2021-09-10: qty 1

## 2021-09-10 NOTE — Assessment & Plan Note (Signed)
Completed antibiotic treatment.  One low grade fever the other day, none since.

## 2021-09-10 NOTE — Progress Notes (Signed)
   Palliative Medicine Inpatient Follow Up Note   I spoke to patients sister, Fidela Salisbury. She shares that she will be available to meet with the palliative care team at [facility] tomorrow.   Meeting confirmed for [place] on 09/11/2021.  Initial note to follow formal meeting.  No Charge.  ______________________________________________________________________________________ [employer] Team Team Cell Phone: [phone] Please utilize secure chat with additional questions, if there is no response within 30 minutes please call the above phone number  Palliative Medicine Team providers are available by phone from 7am to 7pm daily and can be reached through the team cell phone.  Should this patient require assistance outside of these hours, please call the patient's attending physician.

## 2021-09-10 NOTE — Assessment & Plan Note (Signed)
Infection was treated.  Ileus is resolved.  But still really unable to demonstrate oral intake.  Takes NOTHING by mouth from nursing. Anemia complicates this.  I do not really think there is a lingering discitis, but this is worth considering as well.   PEG would require HD, patient has said previously she doesn't want.    - 48 hr calorie count - Consult Palliative care  - If able to demonstrate adequate oral intake of fluids and calories, can go home with sister.  If not, will need to weigh PEG/HD vs. Hospice care

## 2021-09-10 NOTE — Discharge Instructions (Signed)
Suggestions For Increasing Calories And Protein Several small meals a day are easier to eat and digest than three large ones. Space meals about 2 to 3 hours apart to maximize comfort. Stop eating 2 to 3 hours before bed and sleep with your head elevated if gastric reflux (GERD) and heartburn are problems. Do not eat your favorite foods if you are feeling bad. Save them for when you feel good! Eat breakfast-type foods at any meal. Eggs are usually easy to eat and are great any time of the day. (The same goes for pancakes and waffles.) Eat when you feel hungry. Most people have the greatest appetite in the morning because they have not eaten all night. If this is the best meal for you, then pile on those calories and other nutrients in the morning and at lunch. Then you can have a smaller dinner without losing total calories for the day. Eat leftovers or nutritious snacks in the afternoon and early evening to round out your day. Try homemade or commercially prepared nutrition bars and puddings, as well as calorie- and protein-rich liquid nutritional supplements. Benefits of Physical Activity Talk to your doctor about physical activity. Light or moderate physical activity can help maintain muscle and promote an appetite. Walking in the neighborhood or the local mall is a great way to get up, get out, and get moving. If you are unsteady on your feet, try walking around the dining room table. Save Room for [employer]! Drink most fluids between meals instead of with meals. (It is fine to have a sip to help swallow food at meal time.) Fluids (which usually have fewer calories and nutrients than solid food) can take up valuable space in your stomach.  Foods Recommended High-Protein Foods Milk products Add cheese to toast, crackers, sandwiches, baked potatoes, vegetables, soups, noodles, meat, and fruit. Use reduced-fat (2%) or whole milk in place of water when cooking cereal and cream soups. Include  cream sauces on vegetables and pasta. Add powdered milk to cream soups and mashed potatoes.  Eggs Have hard-cooked eggs readily available in the refrigerator. Chop and add to salads, casseroles, soups, and vegetables. Make a quick egg salad. All eggs should be well cooked to avoid the risk of harmful bacteria.  Meats, poultry, and fish Add leftover cooked meats to soups, casseroles, salads, and omelets. Make dip by mixing diced, chopped, or shredded meat with sour cream and spices.  Beans, legumes, nuts, and seeds Sprinkle nuts and seeds on cereals, fruit, and desserts such as ice cream, pudding, and custard. Also serve nuts and seeds on vegetables, salads, and pasta. Spread peanut butter on toast, bread, English muffins, and fruit, or blend it in a milk shake. Add beans and peas to salads, soups, casseroles, and vegetable dishes.  High-Calorie Foods Butter, margarine, and  oils Melt butter or margarine over potatoes, rice, pasta, and cooked vegetables. Add melted butter or margarine into soups and casseroles and spread on bread for sandwiches before spreading sandwich spread or peanut butter. Saut or stir-fry vegetables, meats, chicken and fish such as shrimp/scallops in olive or canola oil. A variety of oils add calories and can be used to [employer], chicken, or fish.  Milk products Add whipping cream to desserts, pancakes, waffles, fruit, and hot chocolate, and fold it into soups and casseroles. Add sour cream to baked potatoes and vegetables.  Salad dressing Use regular (not low-fat or diet) mayonnaise and salad dressing on sandwiches and in dips with vegetables and fruit.  Sweets Add jelly and honey to bread and crackers. Add jam to fruit and ice cream and as a topping over cake.   Copyright 2020  Academy of Nutrition and Dietetics. All rights reserved. [facility] Stay Proper nutrition can help your body recover from illness and injury.   Foods and beverages high in protein,  vitamins, and minerals help rebuild muscle loss, promote healing, & reduce fall risk.   In addition to eating healthy foods, a nutrition shake is an easy, delicious way to get the nutrition you need during and after your hospital stay  It is recommended that you continue to drink 2 bottles per day of:       nepro shake for at least 1 month (30 days) after your hospital stay   Tips for adding a nutrition shake into your routine: As allowed, drink one with vitamins or medications instead of water or juice Enjoy one as a tasty mid-morning or afternoon snack Drink cold or make a milkshake out of it Drink one instead of milk with cereal or snacks Use as a coffee creamer   Available at the following grocery stores and pharmacies:           * [place] [phone]            For COUPONS visit: www.ensure.com/join or http://dawson-may.com/   Suggested Substitutions Ensure Plus = Boost Plus = Carnation Breakfast Essentials = Boost Compact Ensure Active Clear = Boost Breeze Glucerna Shake = Boost Glucose Control = Carnation Breakfast Essentials SUGAR FREE

## 2021-09-10 NOTE — Progress Notes (Addendum)
Nutrition Follow-up  DOCUMENTATION CODES:  Non-severe (moderate) malnutrition in context of chronic illness  INTERVENTION:  -d/c tube feeding and Cortrak prior to discharge, but continue TF in the meantime due to poor intake:  Vital 1.5 @ 55 ml/hr (1320 ml/day) ProSource TF 45 ml BID 110ml free water flushes Q4H provides 2060 kcal, 111 grams of protein, and 2049ml total free water    -Nepro Shake po BID, each supplement provides 425 kcal and 19 grams protein -renavite daily  NUTRITION DIAGNOSIS:  Moderate Malnutrition related to chronic illness (ESRD, CHF) as evidenced by mild fat depletion, moderate muscle depletion. Ongoing  GOAL:  Patient will meet greater than or equal to 90% of their needs Addressing with TF and supplements  MONITOR:  PO intake, Labs, Weight trends, TF tolerance, Skin, I & O's, Other (TPN)  REASON FOR ASSESSMENT:  Consult Enteral/tube feeding initiation and management  ASSESSMENT:  55 year old female who presented to the ED on 11/04 with N/V/D x 1 week. PMH of ESRD on PD at home, HTN, HLD, schizophrenia, seizures, CHF, hypothyroidism, discitis/osteomyelitis and epidural abscess in April 2022. Pt admitted with sepsis, aspiration pneumonia, spontaneous bacterial peritonitis.  11/05 - transferred to ICU 11/06 - NPO 11/07 - diet advanced to dysphagia 2 with thin liquids 11/09 - Cortrak placed (tip gastric), tube feeds started at trickle rate 11/10 - pt with vomiting in early AM, tube feeds held, KUB showing ileus, reglan started, tube feeds restarted at trickle rate 11/13 - projectile vomiting, tube feeds held 11/14 - PICC placed 11/15 - TPN start 11/18 - TPN discontinued  Discussed pt with MD. Although pt's PO intake has been inadequate to meet needs (0-25% meal intake), pt's sister wishes for pt to discharge home without TF and states she will feed pt at discharge and feels pt will eat better when at home surrounded by family and in a familiar  environment. Per MD, plan is for likely discharge today. RD to discontinue TF and order ONS to provide additional kcals/supplements. Later discussed pt with RN and made plan to maintain TF as currently ordered until pt ready to discharge home, and then tube feeding and Cortrak may be discontinued.   Admit weight: 62.5 kg Current weight: 61.6 kg  Medications: Scheduled Meds:  benztropine  1 mg Per Tube QHS   Chlorhexidine Gluconate Cloth  6 each Topical Daily   [START ON 09/13/2021] darbepoetin (ARANESP) injection - DIALYSIS  150 mcg Subcutaneous Q Tue-1800   [START ON 09/11/2021] feeding supplement (NEPRO CARB STEADY)  237 mL Oral BID BM   feeding supplement (PROSource TF)  45 mL Per Tube BID   FLUoxetine  20 mg Per Tube Daily   free water  175 mL Per Tube Q4H   gentamicin cream  1 application Topical Daily   dianeal solution for CAPD/CCPD with heparin   Peritoneal Dialysis Q24H   heparin injection (subcutaneous)  5,000 Units Subcutaneous Q8H   levETIRAcetam  500 mg Per Tube BID   levothyroxine  50 mcg Per Tube Q breakfast   midodrine  10 mg Per Tube TID WC   multivitamin  1 tablet Oral QHS   pantoprazole sodium  40 mg Per Tube Daily   risperiDONE  3 mg Per Tube QHS   sodium chloride flush  10-40 mL Intracatheter Q12H   sodium chloride flush  10-40 mL Intracatheter Q12H   sodium chloride flush  3 mL Intravenous Q12H   valproic acid  500 mg Per Tube BID  Continuous Infusions:  dialysis  solution 1.5% low-MG/low-CA     dialysis solution 2.5% low-MG/low-CA     feeding supplement (VITAL 1.5 CAL)     Labs: Recent Labs  Lab 09/06/21 0315 09/07/21 0515 09/08/21 0421 09/09/21 0417  NA 127* 128* 127* 126*  K 4.6 4.2 4.3 4.1  CL 90* 92* 90* 90*  CO2 21* 24 26 25   BUN 46* 43* 48* 47*  CREATININE 4.32* 4.37* 4.42* 4.26*  CALCIUM 9.3 9.1 9.6 9.8  MG 1.7 1.9 1.8  --   PHOS 4.1 3.1 2.5  --   GLUCOSE 98 140* 111* 104*  CBGs: 89-109 x24 hours  CCPD total output for exchanges: 1007  ml I/Os: -2138L since admit  Diet Order:   Diet Order             DIET DYS 2 Room service appropriate? Yes; Fluid consistency: Thin  Diet effective now                   EDUCATION NEEDS:   Not appropriate for education at this time  Skin:  Skin Assessment: Skin Integrity Issues: Other: MASD to groin and buttocks, wound to R heel  Last BM:  11/18 type 6  Height:   Ht Readings from Last 1 Encounters:  08/28/21 5\' 4"  (1.626 m)    Weight:   Wt Readings from Last 1 Encounters:  09/09/21 61.6 kg    BMI:  Body mass index is 23.31 kg/m.  Estimated Nutritional Needs:   Kcal:  1900-2100  Protein:  90-110 grams  Fluid:  1000 ml + UOP    Casmer Yepiz A., MS, RD, LDN (she/her/hers) RD pager number and weekend/on-call pager number located in [place].

## 2021-09-10 NOTE — Progress Notes (Addendum)
[facility] Progress Note   Subjective:  Seen in room, nonverbal. TPN dc'd yesterday, TF's resumed.   Objective Vitals:   09/10/21 0019 09/10/21 0410 09/10/21 0732 09/10/21 0833  BP: 110/62 104/61  115/64  Pulse: (!) 101 (!) 108  (!) 107  Resp: (!) 25 (!) 21  (!) 27  Temp:   98.6 F (37 C) 98.8 F (37.1 C)  TempSrc:   Axillary Axillary  SpO2: 99% 98%  95%  Weight:      Height:       Physical Exam General: Chronically ill appearing woman, NAD. NG tube in place. Heart: RRR; 2/6 murmur Lungs: CTA anteroirly Abdomen: soft, mildly tender but without guarding. PD exit site looks fine per dialysis RN Extremities: No LE edema Dialysis Access: PD cath  Additional Objective Labs: Basic Metabolic Panel: Recent Labs  Lab 09/06/21 0315 09/07/21 0515 09/08/21 0421 09/09/21 0417  NA 127* 128* 127* 126*  K 4.6 4.2 4.3 4.1  CL 90* 92* 90* 90*  CO2 21* 24 26 25   GLUCOSE 98 140* 111* 104*  BUN 46* 43* 48* 47*  CREATININE 4.32* 4.37* 4.42* 4.26*  CALCIUM 9.3 9.1 9.6 9.8  PHOS 4.1 3.1 2.5  --     CBC: Recent Labs  Lab 09/04/21 0210 09/05/21 0406 09/06/21 0315 09/09/21 0417 09/10/21 0326  WBC 6.2 6.6 6.7 7.0 6.7  HGB 9.4* 9.1* 8.9* 7.5* 7.0*  HCT 28.6* 27.9* 27.2* 22.8* 21.6*  MCV 86.9 86.9 86.1 86.7 86.4  PLT 117* 120* 136* 278 326   Medications:  feeding supplement (VITAL 1.5 CAL) 1,000 mL (09/10/21 1031)    benztropine  1 mg Per Tube QHS   Chlorhexidine Gluconate Cloth  6 each Topical Daily   [START ON 09/13/2021] darbepoetin (ARANESP) injection - DIALYSIS  150 mcg Subcutaneous Q Tue-1800   feeding supplement (PROSource TF)  45 mL Per Tube BID   FLUoxetine  20 mg Per Tube Daily   free water  175 mL Per Tube Q4H   gentamicin cream  1 application Topical Daily   dianeal solution for CAPD/CCPD with heparin   Peritoneal Dialysis Q24H   heparin injection (subcutaneous)  5,000 Units Subcutaneous Q8H   levETIRAcetam  500 mg Per Tube BID   levothyroxine   50 mcg Per Tube Q breakfast   midodrine  10 mg Per Tube TID WC   pantoprazole sodium  40 mg Per Tube Daily   risperiDONE  3 mg Per Tube QHS   sodium chloride flush  10-40 mL Intracatheter Q12H   sodium chloride flush  10-40 mL Intracatheter Q12H   sodium chloride flush  3 mL Intravenous Q12H   valproic acid  500 mg Per Tube BID    Dialysis Orders: [facility]  [facility] [phone] (HT not open on weekends)  OP Nephrologist: Dimas Chyle  Ambulatory PD  7x/week  4 exchanges 2L   08/31/21 - Dr Johnney Ou spoke to RN at her dialysis clinic - has 2 family members (sister and BIL) who do her PD treatments for her and have been providing essentially total care to her recently.  Her BP chronically runs on the low side.  Her current clinical status doesn't seem far off her baseline actually, so it's reasonable to continue her PD.  Spoke to [employer]  her sister who agrees she and husband can continue the current level of care for [place] and would prefer not to switch to HD.  Ongoing discussion as course progresses.  Assessment/Plan: Sepsis: Felt due to aspiration PNA and PD cath related peritonitis. Aspiration PNA-  blood Cx + felt to be contaminant, ID signed off. Finished course of IV Unasyn for pneumonia. Diarrhea - rectal tube in place, C-diff Ag+, toxin negative Malnutrition - dysphagia diet rec'd by nutrition. Alb < 1.5. TPN started 11/15, but ileus improved so was transitioned back to tube feeds 11/18. MRSE PD cath related acute peritonitis: No signs of tunnel infection. Completed 2-week course of Vancomycin ended 11/18, f/u cell count 11/18  down to 1, infection resolved.  Hypotension/volume - s/p pressors, now on midodrine 10mg  TID.  BPs are low- normal now.  Fever 11/17: Repeat Blood Cx drawn. PD cell count 11/18 was low at 1. CXR clear. Per pmd.  ESRD - continue CCPD nightly. Initially on all 4.25% and getting > 1.5L/nightly, then 2.5% w/ 1.0 L UF. BP's soft and wt's down.  Getting TF"s only. Lower UF to mix 1.5% and 2.5%.  Anemia  - Hgb trending down. Started ESA w/ darbe 86mcg weekly, have ^'d to 150 ug weekly, next dose 11/22. Metabolic bone disease -  Corr Ca/Phos ok.  Nutrition - Alb < 1.5, incredibly low. Was on TPN, now back to tube feeding. Mental retardation Seizure d/o Schizoaffective/Bipolar disorder - per primary   Kelly Splinter, MD 09/10/2021, 1:41 PM

## 2021-09-10 NOTE — Assessment & Plan Note (Signed)
Resolved

## 2021-09-10 NOTE — Assessment & Plan Note (Signed)
-  Continue depakote, and cogentin

## 2021-09-10 NOTE — Progress Notes (Signed)
Progress Note    MARSI TURVEY   EGB:[id number]  DOB: [birth year]  DOA: 08/26/2021     14 Date of Service: 09/10/2021      Brief summary: Jane [name], 55 y.o. female with past medical history of end-stage renal disease on peritoneal dialysis, schizoaffective disorder, history of osteomyelitis, epidural abscess presented to hospital with nausea, vomiting, diarrhea, abdominal pain and cough.  At baseline, patient is wheelchair-bound and has been dealing with disabling psych issues.  In the ED, patient had hypotension, acute encephalopathy and was admitted hospital for septic shock, aspiration pneumonia, acute metabolic encephalopathy initially to the ICU.  Patient was seen by nephrology and ID.  CT scan of the abdomen pelvis showed pneumonia and small ascites.  Patient was treated for spontaneous bacterial peritonitis secondary to MRSE from PD catheter and aspiration pneumonia.     During hospitalization, patient was put on enteral tube feeding and TPN due to poor oral intake and ileus  She did have a prolonged stay in the hospital due to this.  At this time, ileus has been resolving.  Tube feeding has been reinitiated.           Assessment and Plan * Septic shock ([place])    Dialysis-associated peritonitis ([place]) Completed antibiotic treatment.  One low grade fever the other day, none since.  Failure to thrive in adult Infection was treated.  Ileus is resolved.  But still really unable to demonstrate oral intake.  Takes NOTHING by mouth from nursing. Anemia complicates this.  I do not really think there is a lingering discitis, but this is worth considering as well.   PEG would require HD, patient has said previously she doesn't want.    - 48 hr calorie count - Consult Palliative care  - If able to demonstrate adequate oral intake of fluids and calories, can go home with sister.  If not, will need to weigh PEG/HD vs. Hospice care   Anemia due to stage 5 chronic kidney  disease ([place]) - Aranesp - Trend CBC - Transfusion threshold 7 g/dL  Schizoaffective disorder (HCC) -Continue depakote, and cogentin  Seizure disorder (HCC) No seizures -Continue Keppra  End-stage renal disease on peritoneal dialysis [facility]) - Consult Nephrology appreicate cares  Malnutrition of moderate degree    Aspiration pneumonia of both lower lobes due to gastric secretions (HCC) Resolved    Inactive issues Diarrhea with C. difficile antigen positive toxin negative, indeterminate results.  Patient was on rectal tube which has been removed. Diarrhea improved    MRSE and GPR positive in 1/2 sets felt to be contaminant.  Repeat blood cultures were sent on 09/08/2021    History of discitis/epidural abscess  Status postlaminectomy on 01/25/2021 with MSSA and completed 8 weeks of antibiotic.  Patient is wheelchair bound at this time.  Needing total assist and care.      Subjective:  No fever, no confusion.  Denies pain, no pain in the back, no pain in the legs, no pain in the abdomen.  Has a wet cough, but no sputum, does not complain of cough, nor dyspnea.  Objective Vitals:   09/10/21 0410 09/10/21 0732 09/10/21 0833 09/10/21 1459  BP: 104/61  115/64   Pulse: (!) 108  (!) 107   Resp: (!) 21  (!) 27   Temp:  98.6 F (37 C) 98.8 F (37.1 C) 99.6 F (37.6 C)  TempSrc:  Axillary Axillary Axillary  SpO2: 98%  95%   Weight:  Height:       61.6 kg  Vital signs were reviewed and unremarkable except for: Pulse: elevated for the last week   Exam General appearance: Chronically ill-appearing adult female, lying in bed, does not have generalized, moans, groans, makes eye contact and closes her eyes again     HEENT: Oropharynx tacky dry, no oral lesions, dentition in poor repair, NG tube in place, no nasal deformity or discharge. Skin: No suspicious rashes or lesions Cardiac: Tachycardic, regular, no murmurs, no lower extremity edema, mild nonpitting edema in all 4  extremities Respiratory: Normal respiratory rate rhythm, coarse rhonchi bilaterally, no wheeze Abdomen: Abdomen soft without tenderness palpation or guarding, no ascites or distention MSK: Moderately reduced muscle mass and fat diffusely Neuro: Awake, makes eye contact, extraocular movements intact, face symmetric, speech fluent, but mostly does not cooperate with exam Psych: Attention blunted, affect sad, tearful at times    Labs / Other Information My review of labs, imaging, notes and other tests shows no new significant findings.    Disposition Plan: Status is: Inpatient  Remains inpatient appropriate because: unable to meet food and nutrition needs by mouth.  Will need calorie count and ongoing goals of care discussions re PEG tube and hemodialsis vs hospice        Time spent: 35 minutes [facility] 09/10/2021, 5:49 PM

## 2021-09-10 NOTE — Assessment & Plan Note (Signed)
-   Aranesp - Trend CBC - Transfusion threshold 7 g/dL

## 2021-09-10 NOTE — Assessment & Plan Note (Signed)
-   Consult Nephrology appreicate cares

## 2021-09-10 NOTE — Consult Note (Signed)
Palliative Medicine Inpatient Jane [name] Note  Consulting Provider: Edwin Dada, MD  Reason for Jane [name]:   Jane Jane [name]  Reason for Jane [name]? patient with ESRD, looking bad, may fail to thrive   HPI:  Per intake H&P --> Jane Jane [name], 55 y.o. female with past medical history of end-stage renal disease on peritoneal dialysis, schizoaffective disorder, history of osteomyelitis, epidural abscess presented to hospital with nausea, vomiting, diarrhea, abdominal pain and cough.  At baseline, patient is wheelchair-bound and has been dealing with disabling psych issues.  In the ED, patient had hypotension, acute encephalopathy and was admitted hospital for septic shock, aspiration pneumonia, acute metabolic encephalopathy initially to the ICU.  Patient was seen by nephrology and ID.  CT scan of the abdomen pelvis showed pneumonia and small ascites.  Patient was treated for spontaneous bacterial peritonitis secondary to MRSE from PD catheter and aspiration pneumonia.     During hospitalization, patient was put on enteral tube feeding and TPN due to poor oral intake and ileus  She did have a prolonged stay in the hospital due to this.  At this time, ileus has been resolving.  Tube feeding has been reinitiated.     Clinical Assessment/Goals of Care:  *Please note that this is a verbal dictation therefore any spelling or grammatical errors are due to the "[employer]" system interpretation.  I have reviewed medical records including EPIC notes, labs and imaging, received report from bedside RN, assessed the patient who was lying in bed in NAD.     I met with Jane Jane [name] (sister) to further discuss diagnosis prognosis, GOC, EOL wishes, disposition and options.   I introduced Palliative Medicine as specialized medical care for people living with serious illness. It focuses on providing relief from the symptoms and stress of a serious  illness. The goal is to improve quality of life for both the patient and the family.  Jane Jane [name] is from [place], [place]. She has two siblings, her sister Jane Jane [name] is her primary caregiver. She was in a group home until about a decade ago. She has never worked due to her psychiatric illnesses. She loves spending time with her family and singing gospel music. She is a woman of faith and practices within the [facility] denomination.   Jane Jane [name] reports that since Jane Jane [name]'s spinal surgery seven months ago that she has not walked. She prior was able to participate in bADL's. Since surgery Jane Jane [name] has gotten her into her wheelchair daily, dresses her, changes her, and baths her. Jane Jane [name] was able to feed herself prior to hospitalization.  From a nutritional perspective, Jane Jane [name]'s intake had been dwindling for sometime though Jane Jane [name] reports she transitioned at home to small frequent meals Q2H which was well received by her sister.   A detailed discussion was had today regarding advanced directives - patient sister is her primary CG and decision maker.    Concepts specific to code status, artifical feeding and hydration, continued IV antibiotics and rehospitalization was had.  Provided patient with a "Hard Choices for [employer]" booklet.  __________________________________  Dr. Loleta Jane [name] was able to come in and meet with Jane Jane [name] and I during our morning meeting.  He provided a brief review of Jane Jane [name]'s acute on chronic illnesses during this hospitalization.He shared his optimism for the future. A review of best case and worst case scenarios was had as they relate to Jane [name]'s clinical state.  He shared his hope that Jane Jane [name] goes home starts to eat and drink  sufficiently and is getting out of bed regularly.  He shared the worst case which is potentially she goes home and is not thriving and continues to decline.  I was able to review with Jane Jane [name] in brief the importance of nutrition and mobility.  We reviewed  that ideally we like to veer away from placement of gastrostomy tubes if at all possible.  We discussed that this is an intervention which carries risks.  Discussed recurrent risk of aspiration irregardless of any care modality pursued.  Dr. Loleta Jane [name] shared with Jane Jane [name] the idea of what she would want to do if Jane Jane [name] does not thrive in the home.  He shared the option of either sending her back to the hospital or of having hospice get him involved.  He described what hospice could provide in terms of symptomatic support.  We reviewed that irregardless additional support will be arranged for home inclusive of PT/OT/speech/RN/home palliative care.   Dr. Loleta Jane [name] shared he plans to remove the core track feeding tube today and will plan for [name] to discharge after she receives a blood transfusion.  Discussed the importance of continued conversation with family and their  medical providers regarding overall plan of care and treatment options, ensuring decisions are within the context of the patients values and GOCs.  Coordination with transition of care team to ensure a safe discharge has taken place.  Decision Maker: Jane Jane [name] (sister) [phone]  SUMMARY OF RECOMMENDATIONS   Full code/full scope of care --> provided "hard choices for loving people book" shared that it would be useful to think about what [name]'s wishes would be moving forward in the event of a cardiopulmonary arrest.  Plan for discharge home with home health and outpatient palliative support through [facility] [place]  Ongoing incremental palliative care support until discharge  Code Status/Advance Care Planning: FULL CODE   Palliative Prophylaxis:  Oral care, mobility  Additional Recommendations (Limitations, Scope, Preferences): Continue current scope of care    Psycho-social/Spiritual:  Desire for further Chaplaincy support: Yes, Jane Jane [name] Additional Recommendations: Education on chronic disease processes    Prognosis: Worrisome prognosis in the setting of chronic comorbidities and increased frailty.  Appears to have a high risk of mortality within the next 12 months.  Discharge Planning: Discharge to home with home health and outpatient palliative support.  Vitals:   09/10/21 0732 09/10/21 0833  BP:  115/64  Pulse:  (!) 107  Resp:  (!) 27  Temp: 98.6 F (37 C) 98.8 F (37.1 C)  SpO2:  95%    Intake/Output Summary (Last 24 hours) at 09/10/2021 1127 Last data filed at 09/10/2021 0835 Gross per 24 hour  Intake 1416.25 ml  Output 1007 ml  Net 409.25 ml   Last Weight  Most recent update: 09/09/2021  8:16 AM    Weight  61.6 kg (135 lb 12.9 oz)            Gen: Middle-aged African-American female in mild distress HEENT: Core track in place, dry mucous membranes CV: Irregular rate and rhythm PULM: On room air, rhonchorous throughout ABD: soft/nontender  EXT: Pedal edema present Neuro: Somnolent  PPS: 30%   This conversation/these recommendations were discussed with patient primary care team, Dr. Loleta Jane [name]  Time In: 1030 Time Out: 1200 Total Time: 90 minutes Greater than 50%  of this time was spent counseling and coordinating care related to the above assessment and plan.  Long Palliative Medicine Team Team Cell Phone: [phone] Please utilize secure chat with  additional questions, if there is no response within 30 minutes please call the above phone number  Palliative Medicine Team providers are available by phone from 7am to 7pm daily and can be reached through the team cell phone.  Should this patient require assistance outside of these hours, please call the patient's attending physician.   

## 2021-09-10 NOTE — Hospital Course (Addendum)
Jane [name], 55 y.o. female with past medical history of end-stage renal disease on peritoneal dialysis, schizoaffective disorder, history of osteomyelitis, epidural abscess presented to hospital with nausea, vomiting, diarrhea, abdominal pain and cough.  At baseline, patient is wheelchair-bound and has been dealing with disabling psych issues.  In the ED, patient had hypotension, acute encephalopathy and was admitted hospital for septic shock, aspiration pneumonia, acute metabolic encephalopathy initially to the ICU.  Patient was seen by nephrology and ID.  CT scan of the abdomen pelvis showed pneumonia and small ascites.  Patient was treated for spontaneous bacterial peritonitis secondary to MRSE from PD catheter and aspiration pneumonia.     During hospitalization, patient was put on enteral tube feeding and TPN due to poor oral intake and ileus  She did have a prolonged stay in the hospital due to this.  At this time, ileus has been resolving.  Tube feeding has been reinitiated.

## 2021-09-10 NOTE — Assessment & Plan Note (Signed)
No seizures -Continue Keppra

## 2021-09-11 DIAGNOSIS — Z7189 Other specified counseling: Secondary | ICD-10-CM

## 2021-09-11 LAB — CBC
HCT: 21.3 % — ABNORMAL LOW (ref 36.0–46.0)
Hemoglobin: 6.8 g/dL — CL (ref 12.0–15.0)
MCH: 28.2 pg (ref 26.0–34.0)
MCHC: 31.9 g/dL (ref 30.0–36.0)
MCV: 88.4 fL (ref 80.0–100.0)
Platelets: 377 10*3/uL (ref 150–400)
RBC: 2.41 MIL/uL — ABNORMAL LOW (ref 3.87–5.11)
RDW: 24.3 % — ABNORMAL HIGH (ref 11.5–15.5)
WBC: 6.9 10*3/uL (ref 4.0–10.5)
nRBC: 0 % (ref 0.0–0.2)

## 2021-09-11 LAB — PREPARE RBC (CROSSMATCH)

## 2021-09-11 LAB — BASIC METABOLIC PANEL
Anion gap: 10 (ref 5–15)
BUN: 55 mg/dL — ABNORMAL HIGH (ref 6–20)
CO2: 26 mmol/L (ref 22–32)
Calcium: 9.4 mg/dL (ref 8.9–10.3)
Chloride: 87 mmol/L — ABNORMAL LOW (ref 98–111)
Creatinine, Ser: 4.22 mg/dL — ABNORMAL HIGH (ref 0.44–1.00)
GFR, Estimated: 12 mL/min — ABNORMAL LOW (ref >60)
Glucose, Bld: 93 mg/dL (ref 70–99)
Potassium: 5 mmol/L (ref 3.5–5.1)
Sodium: 123 mmol/L — ABNORMAL LOW (ref 135–145)

## 2021-09-11 MED ORDER — DELFLEX-LC/2.5% DEXTROSE 394 MOSM/L IP SOLN: INTRAPERITONEAL | Status: DC

## 2021-09-11 MED ORDER — DELFLEX-LC/1.5% DEXTROSE 344 MOSM/L IP SOLN: INTRAPERITONEAL | Status: DC

## 2021-09-11 MED ORDER — NEPRO/CARBSTEADY PO LIQD: 237.0000 mL | Freq: Two times a day (BID) | ORAL | 0 refills | Status: DC

## 2021-09-11 MED ORDER — ONDANSETRON HCL 4 MG PO TABS: 4.0000 mg | ORAL_TABLET | Freq: Four times a day (QID) | ORAL | 3 refills | Status: DC | PRN

## 2021-09-11 MED ORDER — MIDODRINE HCL 10 MG PO TABS: 10.0000 mg | ORAL_TABLET | Freq: Three times a day (TID) | ORAL | 3 refills | Status: AC

## 2021-09-11 MED ORDER — SODIUM CHLORIDE 0.9% IV SOLUTION: Freq: Once | INTRAVENOUS | Status: DC

## 2021-09-11 NOTE — Progress Notes (Signed)
[facility] Progress Note   Subjective:  Seen in room.    Objective Vitals:   09/11/21 0908 09/11/21 0911 09/11/21 1200 09/11/21 1215  BP:  129/67 133/76   Pulse:   (!) 113 (!) 108  Resp:   18 16  Temp:  98.3 F (36.8 C) 97.8 F (36.6 C) 97.6 F (36.4 C)  TempSrc:  Oral Oral   SpO2:   96% 96%  Weight: 61.8 kg     Height:       Physical Exam General: Chronically ill appearing woman, NAD. NG tube in place. Heart: RRR; 2/6 murmur Lungs: CTA anteroirly Abdomen: soft. PD cath in LQ Extremities: No LE edema Dialysis Access: PD cath  Additional Objective Labs: Basic Metabolic Panel: Recent Labs  Lab 09/06/21 0315 09/07/21 0515 09/08/21 0421 09/09/21 0417 09/11/21 0309  NA 127* 128* 127* 126* 123*  K 4.6 4.2 4.3 4.1 5.0  CL 90* 92* 90* 90* 87*  CO2 21* 24 26 25 26   GLUCOSE 98 140* 111* 104* 93  BUN 46* 43* 48* 47* 55*  CREATININE 4.32* 4.37* 4.42* 4.26* 4.22*  CALCIUM 9.3 9.1 9.6 9.8 9.4  PHOS 4.1 3.1 2.5  --   --     CBC: Recent Labs  Lab 09/05/21 0406 09/06/21 0315 09/09/21 0417 09/10/21 0326 09/11/21 0309  WBC 6.6 6.7 7.0 6.7 6.9  HGB 9.1* 8.9* 7.5* 7.0* 6.8*  HCT 27.9* 27.2* 22.8* 21.6* 21.3*  MCV 86.9 86.1 86.7 86.4 88.4  PLT 120* 136* 278 326 377   Medications:  dialysis solution 1.5% low-MG/low-CA     dialysis solution 2.5% low-MG/low-CA     feeding supplement (VITAL 1.5 CAL) 1,000 mL (09/11/21 0429)    sodium chloride   Intravenous Once   benztropine  1 mg Per Tube QHS   Chlorhexidine Gluconate Cloth  6 each Topical Daily   [START ON 09/13/2021] darbepoetin (ARANESP) injection - DIALYSIS  150 mcg Subcutaneous Q Tue-1800   feeding supplement (NEPRO CARB STEADY)  237 mL Oral BID BM   feeding supplement (PROSource TF)  45 mL Per Tube BID   FLUoxetine  20 mg Per Tube Daily   free water  175 mL Per Tube Q4H   gentamicin cream  1 application Topical Daily   dianeal solution for CAPD/CCPD with heparin   Peritoneal Dialysis Q24H    heparin injection (subcutaneous)  5,000 Units Subcutaneous Q8H   levETIRAcetam  500 mg Per Tube BID   levothyroxine  50 mcg Per Tube Q breakfast   midodrine  10 mg Per Tube TID WC   multivitamin  1 tablet Oral QHS   pantoprazole sodium  40 mg Per Tube Daily   risperiDONE  3 mg Per Tube QHS   sodium chloride flush  10-40 mL Intracatheter Q12H   sodium chloride flush  10-40 mL Intracatheter Q12H   sodium chloride flush  3 mL Intravenous Q12H   valproic acid  500 mg Per Tube BID    Dialysis Orders: [facility]  [facility] [phone] (HT not open on weekends)  OP Nephrologist: Dimas Chyle  Ambulatory PD  7x/week  4 exchanges 2L   08/31/21 - Dr Johnney Ou spoke to RN at her dialysis clinic - has 2 family members (sister and BIL) who do her PD treatments for her and have been providing essentially total care to her recently.  Her BP chronically runs on the low side.  Her current clinical status doesn't seem far off her baseline actually, so it's  reasonable to continue her PD.  Spoke to [employer]  her sister who agrees she and husband can continue the current level of care for Karys and would prefer not to switch to HD.  Ongoing discussion as course progresses.      Assessment/Plan: Sepsis: Felt due to aspiration PNA and PD cath related peritonitis. Aspiration PNA-  blood Cx + felt to be contaminant, ID signed off. Finished course of IV Unasyn for pneumonia. Diarrhea - rectal tube in place, C-diff Ag+, toxin negative Malnutrition - dysphagia diet rec'd by nutrition. Alb < 1.5. TPN started 11/15, but ileus improved so was transitioned back to tube feeds 11/18. MRSE PD cath related acute peritonitis: No signs of tunnel infection. Completed 2-week course of Vancomycin ended 11/18, f/u cell count 11/18  down to 1, infection resolved.  Hypotension/volume - on midodrine 10mg  TID.  BPs are normal.  Fever 11/17: Repeat Blood Cx drawn. PD cell count 11/18 was low at 1. CXR clear. Per pmd.  ESRD -  continue CCPD nightly. Initially on all 4.25% and getting > 1.5L/nightly, then 2.5% w/ 1.0 L UF. BP's good and wt's down a bit but overall stable in 60- 64kg range. Getting TF"s, TPN dc'd. Cont mix 1.5% and 2.5%.  Anemia  - Hgb trending down. Started ESA w/ darbe 82mcg weekly then ^'d to 150 ug weekly, next dose 11/22. Metabolic bone disease -  Corr Ca/Phos ok.  Nutrition - Alb < 1.5, back on TF"s / Cortrak. Cannot get PEG tube if doing peritoneal dialysis.  Mental retardation Seizure d/o Schizoaffective/Bipolar disorder - per primary   Kelly Splinter, MD 09/11/2021, 1:35 PM

## 2021-09-11 NOTE — Progress Notes (Addendum)
HOSPITAL MEDICINE OVERNIGHT EVENT NOTE    Notified by nursing that patient is anemic is a Hgb of 6.8.  Patient exhibits no associated shortness of breath, chest pain or hypotension.  Patient is not exhibiting any signs of active bleeding.  Slow drop in hemoglobin multifactorial , largely in part due to chronic kidney disease.  Ordering type and screen followed by 1 unit PRBC transfusion now.  Since the anemia is primarily due to anemia of chronic disease and not an active bleed, no post transfusion CBC is necessary.   Vernelle Emerald  MD [facility]

## 2021-09-11 NOTE — Assessment & Plan Note (Signed)
Resolved

## 2021-09-11 NOTE — TOC Progression Note (Signed)
Transition of Care [facility]) - Progression Note    Patient Details  Name: Jane [name] MRN: [id number] Date of Birth: Aug 08, 1966  Transition of Care [facility]) CM/SW Contact  Bartholomew Crews, RN Phone Number: [phone] 09/11/2021, 12:45 PM  Clinical Narrative:     Spoke with patient's sister, Jane [name], on the phone to discuss transition planning. PTA patient lives with Jane [name] and Jane [name]'s husband. Jane [name] or her husband perform the peritoneal dialysis every night for patient. Patient has a wheelchair.   Patient does have a legal guardian, which at this time is her dad, Mechel Haggard.   Discussed referral for outpatient palliative care to [facility]. Jane [name] is agreeable. Referral made.   Discussed referral for home health services. Offered choice of agency. [facility] unable to accept referral. Referral accepted by [facility] for RN, PT, OT, SW, and Aide. Jane [name] notified of accepting agency.   Jane [name] to provide transportation home at discharge. Jane [name] stated that she thought patient to discharge today.   TOC following for transition needs.   Expected Discharge Plan: [place] Barriers to Discharge: Continued Medical Work up  Expected Discharge Plan and Services Expected Discharge Plan: [place] In-house Referral: Hospice / Palliative Care Discharge Planning Services: CM Consult Post Acute Care Choice: [place] arrangements for the past 2 months: Single Family Home                 DME Arranged: N/A DME Agency: NA       HH Arranged: RN, PT, OT, Nurse's Aide, Social Work [employer] Agency: [place] Date HH Agency Contacted: 09/11/21 Time [place]: [place] Representative spoke with at [place]: [place] ([place]) Interventions    Readmission Risk Interventions No flowsheet data found.

## 2021-09-11 NOTE — Assessment & Plan Note (Signed)
-   Aranesp - Trend CBC - Transfusion threshold 7 g/dL

## 2021-09-11 NOTE — Progress Notes (Signed)
Pt discharged approximately 8.32PM via private transportation with family.

## 2021-09-11 NOTE — Assessment & Plan Note (Signed)
-  Continue depakote, and cogentin

## 2021-09-11 NOTE — Assessment & Plan Note (Signed)
Completed antibiotic treatment.  One low grade fever the other day, none since.

## 2021-09-11 NOTE — Assessment & Plan Note (Signed)
Infection was treated.  Ileus is resolved.  But still really unable to demonstrate oral intake.  Takes NOTHING by mouth from nursing. Anemia complicates this.  I do not really think there is a lingering discitis, but this is worth considering as well.   PEG would require HD, patient has said previously she doesn't want.    - 48 hr calorie count - Consult Palliative care  - If able to demonstrate adequate oral intake of fluids and calories, can go home with sister.  If not, will need to weigh PEG/HD vs. Hospice care

## 2021-09-11 NOTE — Discharge Summary (Signed)
Physician Discharge Summary   Patient name: Jane [name]  Admit date:     08/26/2021  Discharge date: 09/11/2021  Discharge Physician: Edwin Dada   PCP: [name], MD   Recommendations at discharge:  Follow up with [facility] If oral intake and functional status and mental status decline, would recommend Hospice If patient acutely ill, family would prefer for her to be treated in the hospital and goals of care established there Follow up with PCP Dr. Vista Lawman if functional status improves Dr. Vista Lawman: Please check Hgb at follow up Dr. Vista Lawman: If functional status improves, and patient could tolerate endoscopy, please refer back to GI for endoscopy for her anemia       Hospital Course   Mrs. Santy is a 55 y.o. F with ESRD on PD, history discitis and epidural abscess earlier this year, s/p laminectomy and 8 weeks antibiotics, now bed bound, history seizures, and sCHF EF 35-40% who presented with nausea, vomiting, diarrhea, abdominal pain and cough.   In the ER, was hypotensive, required pressors and was admitted to ICU in septic shock.      Principal discharge diagnosis: * Septic shock due to Dialysis-associated peritonitis due to staphylococcus epidermidis Admitted with hypotension requiring pressors, with source peritonitis, growing MRSE.  ID consulted and completed antibiotic treatment.      Discharge Diagnoses: Failure to thrive in adult The patient has been bed bound since her admission for discitis and epidural abscess, despite getting appropriate source control, uncomplicated surgery and aggressive antibiotic treatment afterwards.  She again, has been treated aggresively with pressors, antibiotics for the current infection, but remains very weak, eating very little unless her sister feeds her, and not responding to anyone but family.    She has baseline renal failure on dialysis, and severe anemia.   Here, there have been no clinical signs of back pain or leg symptoms to suggests recurrent infection in her spine.  She was treated for pneumonia and has had no further fever, although she is at extremely high risk of aspiration all the time, despite all best efforts to prevent it.    Discussed goals of care with sister and primary care taker.  She wishes to take patient home today.  I agree, at this time, there is no more hospital level care needed.  We will hope that at home, she can mobilize more, eat more, and start to make functional and nutritional recovery.  We will arrange for palliative care, as I suspect she will not, and may need to transition to Hospice soon.       Anemia due to stage 5 chronic kidney disease ([place]) Treated here with transfusion x3 and Aranesp.  She has had absolutely no clinical bleeding.  The significance of that FOBT+ is unclear, but in the absence of hemodynamically significant bleeding (or even clinically visible bleeding), I do not think it would be ethical to offer endoscopy until she has improved from a functional and nutritional standpoint from her current state.    Continue iron, Aranesp.  Follow up with PCP if able.     Schizoaffective disorder (HCC)  Seizure disorder (HCC)  End-stage renal disease on peritoneal dialysis ([place])  Malnutrition of moderate degree  Aspiration pneumonia of both lower lobes due to gastric secretions (HCC) Treated and resolved.  Diarrhea with C. difficile antigen positive toxin negative, indeterminate results       Diarrhea resolved.  MRSE and GPR positive blood cultures  Blood culture results suspected by ID to be contaminants (susceptibilities discordant and multiple species in one bottle).  Repeat cultures negative.    History of discitis/epidural abscess  Status post-laminectomy on 01/25/2021 with MSSA and completed 8 weeks of antibiotics.  Patient has been wheelchair-bound since.      Condition at discharge:  poor  Exam General appearance: Adult female, appears older than her stated age.  Lying in bed.  Poorly responsive, makes eye contact and follows some commands but does not engage     Cardiac: Tachycardic, regular, no murmurs, no lower extremity edema Respiratory: Respiratory rate normal, she is some coarse upper airway rhonchi, no rales or wheezes Abdomen: Abdomen soft, no tenderness palpation or guarding MSK: Reduced muscle mass and fat Psych: Attention diminished, affect blunted, mostly does not engage in conversation, has crying jags, which sister feels are her baseline   Disposition: Home health  Discharge time: greater than 30 minutes.  Follow-up Information     [name], [name], MD. Schedule an appointment as soon as possible for a visit in 1 week(s).   Specialty: Internal Medicine Why: regular followup and blood work Contact information: [address] [place] [address] [phone]         [facility] Follow up.   Specialty: PALLIATIVE CARE Contact information: [address] [place] [address] [facility] Follow up.   Contact information: [address] [place] [address] [phone]                Discharge Instructions     Discharge instructions   Complete by: As directed    From Dr. Loleta Books: Your sister was admitted with spontaneous bacterial peritonitis. While she was here, we also felt that she may have had a pneumonia. She was treated with broad spectrum antibiotics for both, very aggressively.    Resume your home medicines. Start the new medicine midodrine This is to bring the blood pressure up Take this medicine from now on   For nutrition, I encourage you to work with speech therapy from the [facility] home health agency to learn safe approaches to swallowing, and to learn when it is safe to eat a completely regular consistency diet again For now, stick to  minced foods  Drink a Nepro shake (this is a protein supplement for people on Dialysis) once or twice daily between meals   Take your iron for the anemia If your sister does well, go see Dr. Vista Lawman in 1-2 weeks for a follow up appointment Discuss the anemia with him   Discharge wound care:   Complete by: As directed    Cover with foam dressing   Increase activity slowly   Complete by: As directed         Allergies as of 09/11/2021       Reactions   Icodextrin Rash        Medication List     STOP taking these medications    cephALEXin 500 MG capsule Commonly known as: KEFLEX       TAKE these medications    albuterol 108 (90 Base) MCG/ACT inhaler Commonly known as: VENTOLIN HFA Inhale 2 puffs into the lungs every 6 (six) hours as needed for wheezing or shortness of breath.   benztropine 1 MG tablet Commonly known as: COGENTIN Take 1 mg by mouth at bedtime.   Dialyvite 800 0.8 MG Tabs Take 0.8 mg by mouth daily.  divalproex 500 MG DR tablet Commonly known as: DEPAKOTE Take 500 mg by mouth 2 (two) times daily. What changed: Another medication with the same name was removed. Continue taking this medication, and follow the directions you see here.   DSS 100 MG Caps Take 100 mg by mouth daily as needed (constipation).   feeding supplement (NEPRO CARB STEADY) Liqd Take 237 mLs by mouth 2 (two) times daily between meals. Start taking on: September 12, 2021   FLUoxetine 20 MG capsule Commonly known as: PROZAC Take 20 mg by mouth daily.   HYDROcodone-acetaminophen 5-325 MG tablet Commonly known as: NORCO/VICODIN Take 1 tablet by mouth 4 (four) times daily as needed for moderate pain.   iron polysaccharides 150 MG capsule Commonly known as: NIFEREX Take 150 mg by mouth every other day.   levETIRAcetam 500 MG tablet Commonly known as: KEPPRA Take 1 tablet (500 mg total) by mouth 2 (two) times daily.   levothyroxine 50 MCG tablet Commonly known as:  SYNTHROID Take 50 mcg by mouth daily with breakfast.   medroxyPROGESTERone 150 MG/ML injection Commonly known as: DEPO-PROVERA Inject 150 mg into the muscle every 3 (three) months.   midodrine 10 MG tablet Commonly known as: PROAMATINE Place 1 tablet (10 mg total) into feeding tube 3 (three) times daily with meals.   potassium chloride SA 20 MEQ tablet Commonly known as: KLOR-CON Take 20 mEq by mouth daily with breakfast.   risperiDONE 3 MG tablet Commonly known as: RISPERDAL Take 3 mg by mouth at bedtime.   vitamin B-12 500 MCG tablet Commonly known as: CYANOCOBALAMIN Take 500 mcg by mouth daily.               Discharge Care Instructions  (From admission, onward)           Start     Ordered   09/11/21 0000  Discharge wound care:       Comments: Cover with foam dressing   09/11/21 1454            CT ABDOMEN PELVIS WO CONTRAST  Result Date: 08/27/2021 CLINICAL DATA:  Concern for peritonitis or perforation. EXAM: CT ABDOMEN AND PELVIS WITHOUT CONTRAST TECHNIQUE: Multidetector CT imaging of the abdomen and pelvis was performed following the standard protocol without IV contrast. COMPARISON:  CT abdomen pelvis dated 01/25/2021. FINDINGS: Evaluation of this exam is limited in the absence of intravenous contrast. Lower chest: Diffuse interstitial reticular and nodular densities of the lung bases consistent with pneumonia or aspiration. Clinical correlation is recommended. There is coronary vascular calcification. No intra-abdominal free air.  Small ascites. Hepatobiliary: The liver is unremarkable. No intrahepatic biliary dilatation. There is a stone in the neck of the gallbladder. Pancreas: Unremarkable. No pancreatic ductal dilatation or surrounding inflammatory changes. Spleen: Normal in size without focal abnormality. Adrenals/Urinary Tract: The adrenal glands unremarkable. Moderate bilateral renal parenchyma atrophy and scattered dystrophic calcification versus  nonobstructing stones consistent with chronic renal failure. Small bilateral renal cysts suboptimally evaluated. There is no hydronephrosis on either side. The visualized ureters appear unremarkable. The urinary bladder is minimally distended and grossly unremarkable. Stomach/Bowel: There is no bowel obstruction. Diffuse thickened appearance of the small bowel likely related to ascites. Enteritis is less likely. The appendix is normal as visualized. Vascular/Lymphatic: Mild aortoiliac atherosclerotic disease. The IVC is unremarkable. No portal venous gas. There is no adenopathy. Reproductive: The uterus and ovaries are grossly unremarkable. Other: Diffuse subcutaneous edema. Peritoneal dialysis catheter with tip in the right lower quadrant. Musculoskeletal: Osteopenia and  renal osteodystrophy. Bilateral femoral head avascular necrosis. No acute osseous pathology. IMPRESSION: 1. Bilateral lower lobe pneumonia or aspiration. 2. Small ascites. Peritoneal dialysis catheter with tip in the right lower quadrant. 3. Thickened appearance of the small bowel likely related to ascites. Enteritis is less likely. No bowel obstruction. Normal appendix. 4. Cholelithiasis. 5. Aortic Atherosclerosis (ICD10-I70.0). Electronically Signed   By: Anner Crete M.D.   On: 08/27/2021 01:22   DG Abd 1 View  Result Date: 09/08/2021 CLINICAL DATA:  55 year old female on peritoneal dialysis. Query ileus. EXAM: ABDOMEN - 1 VIEW COMPARISON:  09/07/2021 and earlier. FINDINGS: Portable AP supine view at 1024 hours. Stable enteric feeding tube, tip at the gastric antrum. Non obstructed bowel gas pattern, improved since 09/04/2021. Peritoneal dialysis catheter again projects over the lower abdomen. Grossly negative lung bases. No acute osseous abnormality identified. IMPRESSION: 1. Non obstructed bowel gas pattern, improved since 09/04/2021. Doubt ileus. 2. Stable enteric feeding tube, peritoneal dialysis catheter. Electronically Signed    By: Genevie Ann M.D.   On: 09/08/2021 10:41   DG Abd 1 View  Result Date: 09/07/2021 CLINICAL DATA:  Ileus EXAM: ABDOMEN - 1 VIEW COMPARISON:  KUB dated 09/04/2021 FINDINGS: The enteric catheter tip projects over the expected location of the pylorus. Diffuse gaseous distension of the small bowel is again seen, similar to slightly improved compared to the prior study. A peritoneal dialysis catheter is again seen.  The bones are stable. IMPRESSION: Gaseous distention of the small bowel is stable to slightly decreased compared to the study from 09/04/2021. Electronically Signed   By: Valetta Mole M.D.   On: 09/07/2021 08:04   DG Abd 1 View  Result Date: 09/04/2021 CLINICAL DATA:  Abdominal distension.  Evaluate ileus. EXAM: ABDOMEN - 1 VIEW COMPARISON:  Radiograph 09/01/2021.  CT 08/27/2021 FINDINGS: Stable positioning of weighted enteric tube with tip in the right upper quadrant. Gaseous distention of central small bowel measuring up to 3.4 cm, slightly decreased gaseous distension from prior exam. There is air within the transverse colon. No formed stool is seen. Peritoneal dialysis catheter in place. IMPRESSION: Slight decrease in gaseous distention of central small bowel, suggesting persistent possibly improving ileus. Air is seen in the transverse colon. Electronically Signed   By: Keith Rake M.D.   On: 09/04/2021 15:03   DG Abd 1 View  Result Date: 09/01/2021 CLINICAL DATA:  Ileus EXAM: ABDOMEN - 1 VIEW COMPARISON:  07/31/2021 FINDINGS: Enteric tube terminates in the distal gastric antrum/proximal duodenum. Multiple dilated loops of small bowel in the central abdomen, possibly reflecting small bowel ileus, partial small bowel obstruction not excluded. This is unchanged. IMPRESSION: Enteric tube terminates in the distal gastric antrum/proximal duodenum. Stable dilated loops of small bowel, possibly reflecting adynamic small bowel ileus, partial small bowel obstruction not excluded. Electronically  Signed   By: Julian Hy M.D.   On: 09/01/2021 03:36   DG CHEST PORT 1 VIEW  Result Date: 09/08/2021 CLINICAL DATA:  Fever. EXAM: PORTABLE CHEST 1 VIEW COMPARISON:  Chest x-ray 08/27/2021. FINDINGS: Enteric tube extends below the diaphragm. Right-sided central venous catheter tip projects over the brachiocephalic SVC junction. There is no lung consolidation, pleural effusion or pneumothorax. Cardiomediastinal silhouette is within normal limits. No acute fractures are seen. Chronic changes of the right shoulder are similar to the prior exam. IMPRESSION: 1. No active disease. 2. Right sided central venous catheter tip projects over the brachiocephalic SVC junction. 3. Enteric tube extends below the diaphragm. Electronically Signed   By: Warren Lacy  Dagoberto Reef M.D.   On: 09/08/2021 15:12   DG Chest Portable 1 View  Result Date: 08/27/2021 CLINICAL DATA:  Sepsis EXAM: PORTABLE CHEST 1 VIEW COMPARISON:  None. FINDINGS: The heart size and mediastinal contours are within normal limits. Both lungs are clear. The visualized skeletal structures are unremarkable. IMPRESSION: No active disease. Electronically Signed   By: Ulyses Jarred M.D.   On: 08/27/2021 00:21   DG Abd Portable 1V  Result Date: 08/31/2021 CLINICAL DATA:  Feeding tube placement EXAM: PORTABLE ABDOMEN - 1 VIEW COMPARISON:  None. FINDINGS: Weighted enteric tube tip is in stomach just to the right of midline. Multiple loops of mildly distended gas-filled loops of bowel visualized in the mid abdomen. Irregular opacities in the bilateral left visualized lower lungs. IMPRESSION: Enteric tube tip in the stomach.  Other findings as described. Electronically Signed   By: Ofilia Neas M.D.   On: 08/31/2021 12:39   [place] EKG SITE RITE  Result Date: 09/05/2021 If Site Rite image not attached, placement could not be confirmed due to current cardiac rhythm.  Results for orders placed or performed during the hospital encounter of 08/26/21  Resp Panel by  RT-PCR (Flu A&B, Covid) Nasopharyngeal Swab     Status: None   Collection Time: 08/26/21 10:54 PM   Specimen: Nasopharyngeal Swab; Nasopharyngeal(NP) swabs in vial transport medium  Result Value Ref Range Status   SARS Coronavirus 2 by RT PCR NEGATIVE NEGATIVE Final    Comment: (NOTE) SARS-CoV-2 target nucleic acids are NOT DETECTED.  The SARS-CoV-2 RNA is generally detectable in upper respiratory specimens during the acute phase of infection. The lowest concentration of SARS-CoV-2 viral copies this assay can detect is 138 copies/mL. A negative result does not preclude SARS-Cov-2 infection and should not be used as the sole basis for treatment or other patient management decisions. A negative result may occur with  improper specimen collection/handling, submission of specimen other than nasopharyngeal swab, presence of viral mutation(s) within the areas targeted by this assay, and inadequate number of viral copies(<138 copies/mL). A negative result must be combined with clinical observations, patient history, and epidemiological information. The expected result is Negative.  Fact Sheet for Patients:  EntrepreneurPulse.com.au  Fact Sheet for Healthcare Providers:  IncredibleEmployment.be  This test is no t yet approved or cleared by the [place] FDA and  has been authorized for detection and/or diagnosis of SARS-CoV-2 by FDA under an Emergency Use Authorization (EUA). This EUA will remain  in effect (meaning this test can be used) for the duration of the COVID-19 declaration under Section 564(b)(1) of the Act, 21 U.S.C.section 360bbb-3(b)(1), unless the authorization is terminated  or revoked sooner.       Influenza A by PCR NEGATIVE NEGATIVE Final   Influenza B by PCR NEGATIVE NEGATIVE Final    Comment: (NOTE) The Xpert Xpress SARS-CoV-2/FLU/RSV plus assay is intended as an aid in the diagnosis of influenza from Nasopharyngeal swab  specimens and should not be used as a sole basis for treatment. Nasal washings and aspirates are unacceptable for Xpert Xpress SARS-CoV-2/FLU/RSV testing.  Fact Sheet for Patients: EntrepreneurPulse.com.au  Fact Sheet for Healthcare Providers: IncredibleEmployment.be  This test is not yet approved or cleared by the [place] FDA and has been authorized for detection and/or diagnosis of SARS-CoV-2 by FDA under an Emergency Use Authorization (EUA). This EUA will remain in effect (meaning this test can be used) for the duration of the COVID-19 declaration under Section 564(b)(1) of the Act, 21 U.S.C. section 360bbb-3(b)(1),  unless the authorization is terminated or revoked.  Performed at [facility], [place] [address], [place], [place] [address]   Blood culture (routine x 2)     Status: Abnormal   Collection Time: 08/27/21  2:00 AM   Specimen: BLOOD  Result Value Ref Range Status   Specimen Description BLOOD RIGHT ARM  Final   Special Requests   Final    BOTTLES DRAWN AEROBIC AND ANAEROBIC Blood Culture results may not be optimal due to an excessive volume of blood received in culture bottles   Culture  Setup Time   Final    GRAM POSITIVE COCCI AEROBIC BOTTLE ONLY CRITICAL RESULT CALLED TO, READ BACK BY AND VERIFIED WITH: PHARMD GREG ABBOTT 08/28/21@00 :50 BY TW IN BOTH AEROBIC AND ANAEROBIC BOTTLES    Culture (A)  Final    STAPHYLOCOCCUS EPIDERMIDIS DIPHTHEROIDS(CORYNEBACTERIUM SPECIES) Standardized susceptibility testing for this organism is not available. Performed at [facility], [place] [address], [place], [place] [address]    Report Status 08/31/2021 FINAL  Final   Organism ID, Bacteria STAPHYLOCOCCUS EPIDERMIDIS  Final      Susceptibility   Staphylococcus epidermidis - MIC*    CIPROFLOXACIN 1 SENSITIVE Sensitive     ERYTHROMYCIN <=0.25 SENSITIVE Sensitive     GENTAMICIN 8 INTERMEDIATE Intermediate     OXACILLIN >=4  RESISTANT Resistant     TETRACYCLINE 2 SENSITIVE Sensitive     VANCOMYCIN 2 SENSITIVE Sensitive     TRIMETH/SULFA 160 RESISTANT Resistant     CLINDAMYCIN <=0.25 SENSITIVE Sensitive     RIFAMPIN <=0.5 SENSITIVE Sensitive     Inducible Clindamycin NEGATIVE Sensitive     * STAPHYLOCOCCUS EPIDERMIDIS  Blood Culture ID Panel (Reflexed)     Status: Abnormal   Collection Time: 08/27/21  2:00 AM  Result Value Ref Range Status   Enterococcus faecalis NOT DETECTED NOT DETECTED Final   Enterococcus Faecium NOT DETECTED NOT DETECTED Final   Listeria monocytogenes NOT DETECTED NOT DETECTED Final   Staphylococcus species DETECTED (A) NOT DETECTED Final    Comment: CRITICAL RESULT CALLED TO, READ BACK BY AND VERIFIED WITH: PHARMD GREG ABBOTT 08/28/21@00 :50 BY TW    Staphylococcus aureus (BCID) NOT DETECTED NOT DETECTED Final   Staphylococcus epidermidis DETECTED (A) NOT DETECTED Final    Comment: Methicillin (oxacillin) resistant coagulase negative staphylococcus. Possible blood culture contaminant (unless isolated from more than one blood culture draw or clinical case suggests pathogenicity). No antibiotic treatment is indicated for blood  culture contaminants. CRITICAL RESULT CALLED TO, READ BACK BY AND VERIFIED WITH: PHARMD GREG ABBOTT 08/28/21@00 :50 BY TW    Staphylococcus lugdunensis NOT DETECTED NOT DETECTED Final   Streptococcus species NOT DETECTED NOT DETECTED Final   Streptococcus agalactiae NOT DETECTED NOT DETECTED Final   Streptococcus pneumoniae NOT DETECTED NOT DETECTED Final   Streptococcus pyogenes NOT DETECTED NOT DETECTED Final   A.calcoaceticus-baumannii NOT DETECTED NOT DETECTED Final   Bacteroides fragilis NOT DETECTED NOT DETECTED Final   Enterobacterales NOT DETECTED NOT DETECTED Final   Enterobacter cloacae complex NOT DETECTED NOT DETECTED Final   Escherichia coli NOT DETECTED NOT DETECTED Final   Klebsiella aerogenes NOT DETECTED NOT DETECTED Final   Klebsiella oxytoca  NOT DETECTED NOT DETECTED Final   Klebsiella pneumoniae NOT DETECTED NOT DETECTED Final   Proteus species NOT DETECTED NOT DETECTED Final   Salmonella species NOT DETECTED NOT DETECTED Final   Serratia marcescens NOT DETECTED NOT DETECTED Final   Haemophilus influenzae NOT DETECTED NOT DETECTED Final   Neisseria  meningitidis NOT DETECTED NOT DETECTED Final   Pseudomonas aeruginosa NOT DETECTED NOT DETECTED Final   Stenotrophomonas maltophilia NOT DETECTED NOT DETECTED Final   Candida albicans NOT DETECTED NOT DETECTED Final   Candida auris NOT DETECTED NOT DETECTED Final   Candida glabrata NOT DETECTED NOT DETECTED Final   Candida krusei NOT DETECTED NOT DETECTED Final   Candida parapsilosis NOT DETECTED NOT DETECTED Final   Candida tropicalis NOT DETECTED NOT DETECTED Final   Cryptococcus neoformans/gattii NOT DETECTED NOT DETECTED Final   Methicillin resistance mecA/C DETECTED (A) NOT DETECTED Final    Comment: CRITICAL RESULT CALLED TO, READ BACK BY AND VERIFIED WITH: PHARMD GREG ABBOTT 08/28/21@00 :50 BY TW Performed at [facility] [address], [place], [place] [address]   MRSA Next Gen by PCR, Nasal     Status: None   Collection Time: 08/27/21  3:29 AM   Specimen: Nasal Mucosa; Nasal Swab  Result Value Ref Range Status   MRSA by PCR Next Gen NOT DETECTED NOT DETECTED Final    Comment: (NOTE) The GeneXpert MRSA Assay (FDA approved for NASAL specimens only), is one component of a comprehensive MRSA colonization surveillance program. It is not intended to diagnose MRSA infection nor to guide or monitor treatment for MRSA infections. Test performance is not FDA approved in patients less than 40 years old. Performed at [facility], [place] [address] [place], [place] [address]   Blood culture (routine x 2)     Status: None   Collection Time: 08/27/21  7:57 AM   Specimen: BLOOD RIGHT FOREARM  Result Value Ref Range Status   Specimen Description BLOOD RIGHT FOREARM   Final   Special Requests   Final    BOTTLES DRAWN AEROBIC AND ANAEROBIC Blood Culture adequate volume   Culture   Final    NO GROWTH 5 DAYS Performed at [facility], [place] [address] [place], [place] [address]    Report Status 09/01/2021 FINAL  Final  Body fluid culture w Gram Stain     Status: None   Collection Time: 08/27/21  2:16 PM   Specimen: Peritoneal Washings; Body Fluid  Result Value Ref Range Status   Specimen Description PERITONEAL FLUID  Final   Special Requests NONE  Final   Gram Stain   Final    WBC PRESENT,BOTH PMN AND MONONUCLEAR GRAM POSITIVE COCCI CYTOSPIN SMEAR Performed at [facility], [address], [place], [place] [address]    Culture ABUNDANT STAPHYLOCOCCUS EPIDERMIDIS  Final   Report Status 08/30/2021 FINAL  Final   Organism ID, Bacteria STAPHYLOCOCCUS EPIDERMIDIS  Final      Susceptibility   Staphylococcus epidermidis - MIC*    CIPROFLOXACIN 2 INTERMEDIATE Intermediate     ERYTHROMYCIN <=0.25 SENSITIVE Sensitive     GENTAMICIN 4 SENSITIVE Sensitive     OXACILLIN >=4 RESISTANT Resistant     TETRACYCLINE <=1 SENSITIVE Sensitive     VANCOMYCIN 1 SENSITIVE Sensitive     TRIMETH/SULFA 20 SENSITIVE Sensitive     CLINDAMYCIN <=0.25 SENSITIVE Sensitive     RIFAMPIN <=0.5 SENSITIVE Sensitive     Inducible Clindamycin NEGATIVE Sensitive     * ABUNDANT STAPHYLOCOCCUS EPIDERMIDIS  C Difficile Quick Screen w PCR reflex     Status: Abnormal   Collection Time: 08/28/21 10:25 AM   Specimen: STOOL  Result Value Ref Range Status   C Diff antigen POSITIVE (A) NEGATIVE Final    Comment: RESULTS REPORTED TO RN BEABRAUT 08/28/21 AT  1120 BY NM   C Diff toxin NEGATIVE NEGATIVE Final   C Diff interpretation Results are indeterminate. See PCR results.  Final    Comment: Performed at [facility], [place] [address], [place], [place] [address]  C. Diff by PCR, Reflexed     Status: Abnormal   Collection Time: 08/28/21 10:25 AM  Result Value Ref Range Status    Toxigenic C. Difficile by PCR POSITIVE (A) NEGATIVE Final    Comment: Positive for toxigenic C. difficile with little to no toxin production. Only treat if clinical presentation suggests symptomatic illness. Performed at [facility], [place] [address] [place], [place] [address]   Culture, blood (routine x 2)     Status: None   Collection Time: 08/29/21 12:24 PM   Specimen: BLOOD LEFT HAND  Result Value Ref Range Status   Specimen Description BLOOD LEFT HAND  Final   Special Requests   Final    BOTTLES DRAWN AEROBIC AND ANAEROBIC Blood Culture adequate volume   Culture   Final    NO GROWTH 5 DAYS Performed at [facility], [place] [address], [place], [place] [address]    Report Status 09/03/2021 FINAL  Final  Culture, blood (routine x 2)     Status: None   Collection Time: 08/29/21 12:33 PM   Specimen: BLOOD RIGHT HAND  Result Value Ref Range Status   Specimen Description BLOOD RIGHT HAND  Final   Special Requests   Final    BOTTLES DRAWN AEROBIC AND ANAEROBIC Blood Culture adequate volume   Culture   Final    NO GROWTH 5 DAYS Performed at [facility], [place] [address] [place], [place] [address]    Report Status 09/03/2021 FINAL  Final  Culture, blood (Routine X 2) w Reflex to ID Panel     Status: None (Preliminary result)   Collection Time: 09/08/21  3:25 PM   Specimen: BLOOD LEFT ARM  Result Value Ref Range Status   Specimen Description BLOOD LEFT ARM  Final   Special Requests   Final    BOTTLES DRAWN AEROBIC AND ANAEROBIC Blood Culture results may not be optimal due to an inadequate volume of blood received in culture bottles   Culture   Final    NO GROWTH 2 DAYS Performed at [facility], [place] [address] [place], [place] [address]    Report Status PENDING  Incomplete  Culture, blood (Routine X 2) w Reflex to ID Panel     Status: None (Preliminary result)   Collection Time: 09/08/21  3:32 PM   Specimen: BLOOD LEFT HAND  Result Value Ref Range Status    Specimen Description BLOOD LEFT HAND  Final   Special Requests   Final    BOTTLES DRAWN AEROBIC ONLY Blood Culture adequate volume   Culture   Final    NO GROWTH 2 DAYS Performed at [facility], [place] [address] [place], [place] [address]    Report Status PENDING  Incomplete    Signed:  Edwin Dada MD.  [facility] 09/11/2021, 5:20 PM

## 2021-09-11 NOTE — Plan of Care (Signed)

## 2021-09-11 NOTE — Progress Notes (Signed)
Awaiting MD order to remove PICC line.

## 2021-09-11 NOTE — Assessment & Plan Note (Signed)
No seizures -Continue Keppra

## 2021-09-11 NOTE — Assessment & Plan Note (Signed)
-   Consult Nephrology appreicate cares

## 2021-09-12 DIAGNOSIS — N2581 Secondary hyperparathyroidism of renal origin: Secondary | ICD-10-CM | POA: Diagnosis not present

## 2021-09-12 DIAGNOSIS — N186 End stage renal disease: Secondary | ICD-10-CM | POA: Diagnosis not present

## 2021-09-12 DIAGNOSIS — D631 Anemia in chronic kidney disease: Secondary | ICD-10-CM | POA: Diagnosis not present

## 2021-09-12 LAB — TYPE AND SCREEN
ABO/RH(D): O POS
Antibody Screen: NEGATIVE
Unit division: 0

## 2021-09-12 LAB — BPAM RBC
Blood Product Expiration Date: 202212132359
ISSUE DATE / TIME: 202211201053
Unit Type and Rh: 5100

## 2021-09-13 DIAGNOSIS — N186 End stage renal disease: Secondary | ICD-10-CM | POA: Diagnosis not present

## 2021-09-13 DIAGNOSIS — D631 Anemia in chronic kidney disease: Secondary | ICD-10-CM | POA: Diagnosis not present

## 2021-09-13 DIAGNOSIS — N2581 Secondary hyperparathyroidism of renal origin: Secondary | ICD-10-CM | POA: Diagnosis not present

## 2021-09-13 LAB — CULTURE, BLOOD (ROUTINE X 2)
Culture: NO GROWTH
Culture: NO GROWTH
Special Requests: ADEQUATE

## 2021-09-14 DIAGNOSIS — U071 COVID-19: Secondary | ICD-10-CM | POA: Diagnosis not present

## 2021-09-14 DIAGNOSIS — E039 Hypothyroidism, unspecified: Secondary | ICD-10-CM | POA: Diagnosis not present

## 2021-09-14 DIAGNOSIS — N2581 Secondary hyperparathyroidism of renal origin: Secondary | ICD-10-CM | POA: Diagnosis not present

## 2021-09-14 DIAGNOSIS — Z8701 Personal history of pneumonia (recurrent): Secondary | ICD-10-CM | POA: Diagnosis not present

## 2021-09-14 DIAGNOSIS — R627 Adult failure to thrive: Secondary | ICD-10-CM | POA: Diagnosis not present

## 2021-09-14 DIAGNOSIS — M5430 Sciatica, unspecified side: Secondary | ICD-10-CM | POA: Diagnosis not present

## 2021-09-14 DIAGNOSIS — K8010 Calculus of gallbladder with chronic cholecystitis without obstruction: Secondary | ICD-10-CM | POA: Diagnosis not present

## 2021-09-14 DIAGNOSIS — I5020 Unspecified systolic (congestive) heart failure: Secondary | ICD-10-CM | POA: Diagnosis not present

## 2021-09-14 DIAGNOSIS — G40909 Epilepsy, unspecified, not intractable, without status epilepticus: Secondary | ICD-10-CM | POA: Diagnosis not present

## 2021-09-14 DIAGNOSIS — D631 Anemia in chronic kidney disease: Secondary | ICD-10-CM | POA: Diagnosis not present

## 2021-09-14 DIAGNOSIS — L89613 Pressure ulcer of right heel, stage 3: Secondary | ICD-10-CM | POA: Diagnosis not present

## 2021-09-14 DIAGNOSIS — K652 Spontaneous bacterial peritonitis: Secondary | ICD-10-CM | POA: Diagnosis not present

## 2021-09-14 DIAGNOSIS — R188 Other ascites: Secondary | ICD-10-CM | POA: Diagnosis not present

## 2021-09-14 DIAGNOSIS — I700 Atherosclerosis of aorta: Secondary | ICD-10-CM | POA: Diagnosis not present

## 2021-09-14 DIAGNOSIS — F259 Schizoaffective disorder, unspecified: Secondary | ICD-10-CM | POA: Diagnosis not present

## 2021-09-14 DIAGNOSIS — Z3042 Encounter for surveillance of injectable contraceptive: Secondary | ICD-10-CM | POA: Diagnosis not present

## 2021-09-14 DIAGNOSIS — E782 Mixed hyperlipidemia: Secondary | ICD-10-CM | POA: Diagnosis not present

## 2021-09-14 DIAGNOSIS — I959 Hypotension, unspecified: Secondary | ICD-10-CM | POA: Diagnosis not present

## 2021-09-14 DIAGNOSIS — Z793 Long term (current) use of hormonal contraceptives: Secondary | ICD-10-CM | POA: Diagnosis not present

## 2021-09-14 DIAGNOSIS — Z792 Long term (current) use of antibiotics: Secondary | ICD-10-CM | POA: Diagnosis not present

## 2021-09-14 DIAGNOSIS — R1312 Dysphagia, oropharyngeal phase: Secondary | ICD-10-CM | POA: Diagnosis not present

## 2021-09-14 DIAGNOSIS — T8571XD Infection and inflammatory reaction due to peritoneal dialysis catheter, subsequent encounter: Secondary | ICD-10-CM | POA: Diagnosis not present

## 2021-09-14 DIAGNOSIS — D539 Nutritional anemia, unspecified: Secondary | ICD-10-CM | POA: Diagnosis not present

## 2021-09-14 DIAGNOSIS — E440 Moderate protein-calorie malnutrition: Secondary | ICD-10-CM | POA: Diagnosis not present

## 2021-09-14 DIAGNOSIS — N186 End stage renal disease: Secondary | ICD-10-CM | POA: Diagnosis not present

## 2021-09-14 DIAGNOSIS — Z992 Dependence on renal dialysis: Secondary | ICD-10-CM | POA: Diagnosis not present

## 2021-09-14 DIAGNOSIS — R569 Unspecified convulsions: Secondary | ICD-10-CM | POA: Diagnosis not present

## 2021-09-14 DIAGNOSIS — B9562 Methicillin resistant Staphylococcus aureus infection as the cause of diseases classified elsewhere: Secondary | ICD-10-CM | POA: Diagnosis not present

## 2021-09-15 DIAGNOSIS — N2581 Secondary hyperparathyroidism of renal origin: Secondary | ICD-10-CM | POA: Diagnosis not present

## 2021-09-15 DIAGNOSIS — D631 Anemia in chronic kidney disease: Secondary | ICD-10-CM | POA: Diagnosis not present

## 2021-09-15 DIAGNOSIS — N186 End stage renal disease: Secondary | ICD-10-CM | POA: Diagnosis not present

## 2021-09-16 DIAGNOSIS — N2581 Secondary hyperparathyroidism of renal origin: Secondary | ICD-10-CM | POA: Diagnosis not present

## 2021-09-16 DIAGNOSIS — D631 Anemia in chronic kidney disease: Secondary | ICD-10-CM | POA: Diagnosis not present

## 2021-09-16 DIAGNOSIS — N186 End stage renal disease: Secondary | ICD-10-CM | POA: Diagnosis not present

## 2021-09-17 DIAGNOSIS — N2581 Secondary hyperparathyroidism of renal origin: Secondary | ICD-10-CM | POA: Diagnosis not present

## 2021-09-17 DIAGNOSIS — N186 End stage renal disease: Secondary | ICD-10-CM | POA: Diagnosis not present

## 2021-09-17 DIAGNOSIS — D631 Anemia in chronic kidney disease: Secondary | ICD-10-CM | POA: Diagnosis not present

## 2021-09-18 DIAGNOSIS — N186 End stage renal disease: Secondary | ICD-10-CM | POA: Diagnosis not present

## 2021-09-18 DIAGNOSIS — N2581 Secondary hyperparathyroidism of renal origin: Secondary | ICD-10-CM | POA: Diagnosis not present

## 2021-09-18 DIAGNOSIS — D631 Anemia in chronic kidney disease: Secondary | ICD-10-CM | POA: Diagnosis not present

## 2021-09-19 ENCOUNTER — Other Ambulatory Visit: Payer: Self-pay

## 2021-09-19 ENCOUNTER — Encounter (HOSPITAL_BASED_OUTPATIENT_CLINIC_OR_DEPARTMENT_OTHER): Payer: Medicare Other | Attending: Internal Medicine | Admitting: Internal Medicine

## 2021-09-19 DIAGNOSIS — L89613 Pressure ulcer of right heel, stage 3: Secondary | ICD-10-CM

## 2021-09-19 DIAGNOSIS — N2581 Secondary hyperparathyroidism of renal origin: Secondary | ICD-10-CM | POA: Diagnosis not present

## 2021-09-19 DIAGNOSIS — I959 Hypotension, unspecified: Secondary | ICD-10-CM | POA: Diagnosis not present

## 2021-09-19 DIAGNOSIS — D631 Anemia in chronic kidney disease: Secondary | ICD-10-CM | POA: Diagnosis not present

## 2021-09-19 DIAGNOSIS — F259 Schizoaffective disorder, unspecified: Secondary | ICD-10-CM | POA: Diagnosis not present

## 2021-09-19 DIAGNOSIS — K652 Spontaneous bacterial peritonitis: Secondary | ICD-10-CM | POA: Diagnosis not present

## 2021-09-19 DIAGNOSIS — N186 End stage renal disease: Secondary | ICD-10-CM | POA: Insufficient documentation

## 2021-09-19 DIAGNOSIS — T8571XD Infection and inflammatory reaction due to peritoneal dialysis catheter, subsequent encounter: Secondary | ICD-10-CM | POA: Diagnosis not present

## 2021-09-19 DIAGNOSIS — R627 Adult failure to thrive: Secondary | ICD-10-CM | POA: Diagnosis not present

## 2021-09-19 DIAGNOSIS — Z992 Dependence on renal dialysis: Secondary | ICD-10-CM | POA: Insufficient documentation

## 2021-09-19 DIAGNOSIS — I509 Heart failure, unspecified: Secondary | ICD-10-CM | POA: Insufficient documentation

## 2021-09-19 DIAGNOSIS — G40909 Epilepsy, unspecified, not intractable, without status epilepticus: Secondary | ICD-10-CM | POA: Insufficient documentation

## 2021-09-19 DIAGNOSIS — B9562 Methicillin resistant Staphylococcus aureus infection as the cause of diseases classified elsewhere: Secondary | ICD-10-CM | POA: Diagnosis not present

## 2021-09-19 NOTE — Progress Notes (Signed)
Jane [name], Jane [name] ([id number]) Visit Report for 09/19/2021 Allergy List Details Patient Name: Date of Service: Jane [name], Jane [name]. 09/19/2021 7:30 A M Medical Record Number: [id number] Patient Account Number: [id number] Date of Birth/Sex: Treating RN: [birth year] (55 y.o. Female) Jane [name] Primary Care Jane [name]: Jane [name] Other Clinician: Referring Jane [name]: Treating Jane [name]/Extender: Jane [name] in Treatment: 0 Allergies Active Allergies No Known Allergies Allergy Notes Electronic Signature(s) Signed: 09/19/2021 6:07:33 PM By: Jane [name] Entered By: Jane [name] on 09/19/2021 07:57:25 -------------------------------------------------------------------------------- Arrival Information Details Patient Name: Date of Service: Jane Nurse M. 09/19/2021 7:30 A M Medical Record Number: [id number] Patient Account Number: [id number] Date of Birth/Sex: Treating RN: 09-03-1966 (55 y.o. Female) Jane [name] Primary Care Jane [name]: Jane [name] Other Clinician: Referring Jane [name]: Treating Jane [name]/Extender: Jane [name] in Treatment: 0 Visit Information Patient Arrived: Wheel Chair Arrival Time: 07:54 Accompanied By: Jane [name] In Jane [name] Transfer Assistance: Manual Patient Identification Verified: Yes Secondary Verification Process Completed: Yes Patient Requires Transmission-Based Precautions: No Patient Has Alerts: Yes Patient Alerts: R ABI=Non Comp Electronic Signature(s) Signed: 09/19/2021 6:07:33 PM By: Jane [name] Entered By: Jane [name] on 09/19/2021 09:18:10 -------------------------------------------------------------------------------- Clinic Level of Care Assessment Details Patient Name: Date of Service: Jane [name]. 09/19/2021 7:30 A M Medical Record Number: [id number] Patient Account Number: [id number] Date of Birth/Sex: Treating RN: May 05, 1966 (55 y.o. Female)  Jane [name] Primary Care Jane [name]: Jane [name] Other Clinician: Referring Jane [name]: Treating Jane [name]/Extender: Jane [name] in Treatment: 0 Clinic Level of Care Assessment Items TOOL 1 Quantity Score X- 1 0 Use when EandM and Procedure is performed on INITIAL visit ASSESSMENTS - Nursing Assessment / Reassessment X- 1 20 General Physical Exam (combine w/ comprehensive assessment (listed just below) when performed on new pt. evals) X- 1 25 Comprehensive Assessment (HX, ROS, Risk Assessments, Wounds Hx, etc.) ASSESSMENTS - Wound and Skin Assessment / Reassessment []  - 0 Dermatologic / Skin Assessment (not related to wound area) ASSESSMENTS - Ostomy and/or Continence Assessment and Care []  - 0 Incontinence Assessment and Management []  - 0 Ostomy Care Assessment and Management (repouching, etc.) PROCESS - Coordination of Care []  - 0 Simple Patient / Family Education for ongoing care X- 1 20 Complex (extensive) Patient / Family Education for ongoing care X- 1 10 Staff obtains Programmer, systems, Records, T Results / Process Orders est []  - 0 Staff telephones HHA, Nursing Homes / Clarify orders / etc []  - 0 Routine Transfer to another Facility (non-emergent condition) []  - 0 Routine Hospital Admission (non-emergent condition) []  - 0 New Admissions / Biomedical engineer / Ordering NPWT Apligraf, etc. , []  - 0 Emergency Hospital Admission (emergent condition) PROCESS - Special Needs []  - 0 Pediatric / Minor Patient Management []  - 0 Isolation Patient Management []  - 0 Hearing / Language / Visual special needs []  - 0 Assessment of Community assistance (transportation, D/C planning, etc.) []  - 0 Additional assistance / Altered mentation X- 1 15 Support Surface(s) Assessment (bed, cushion, seat, etc.) INTERVENTIONS - Miscellaneous []  - 0 External ear exam []  - 0 Patient Transfer (multiple staff / Civil Service fast streamer / Similar devices) []  -  0 Simple Staple / Suture removal (25 or less) []  - 0 Complex Staple / Suture removal (26 or more) []  - 0 Hypo/Hyperglycemic Management (do not check if billed separately) X- 1 15 Ankle / Brachial Index (ABI) - do not check if billed separately Has the patient been seen at the hospital within the last three years: Yes  Total Score: 105 Level Of Care: New/Established - Level 3 Electronic Signature(s) Signed: 09/19/2021 6:07:33 PM By: Jane [name] Entered By: Jane [name] on 09/19/2021 09:20:50 -------------------------------------------------------------------------------- Encounter Discharge Information Details Patient Name: Date of Service: Jane Nurse M. 09/19/2021 7:30 A M Medical Record Number: [id number] Patient Account Number: [id number] Date of Birth/Sex: Treating RN: 09/15/1966 (55 y.o. Female) Jane [name] Primary Care Jane [name]: Jane [name] Other Clinician: Referring Jane [name]: Treating Jane [name]/Extender: Jane [name] in Treatment: 0 Encounter Discharge Information Items Post Procedure Vitals Discharge Condition: Stable Temperature (F): 98.5 Ambulatory Status: Wheelchair Pulse (bpm): 112 Discharge Destination: Home Respiratory Rate (breaths/min): 18 Transportation: Private Auto Blood Pressure (mmHg): 137/90 Accompanied By: Brother in Law Schedule Follow-up Appointment: Yes Clinical Summary of Care: Provided on 09/19/2021 Form Type Recipient Paper Patient Patient/Caregiver Electronic Signature(s) Signed: 09/19/2021 6:07:33 PM By: Jane [name] Entered By: Jane [name] on 09/19/2021 09:22:29 -------------------------------------------------------------------------------- Lower Extremity Assessment Details Patient Name: Date of Service: Jane Nurse M. 09/19/2021 7:30 A M Medical Record Number: [id number] Patient Account Number: [id number] Date of Birth/Sex: Treating RN: 05/04/1966 (55 y.o. Female)  Jane [name] Primary Care Shahed Yeoman: Jane [name] Other Clinician: Referring Jane [name]: Treating Jane [name] in Treatment: 0 Edema Assessment Assessed: [name]: Yes] Patrice Paradise: Yes] Edema: [Left: Yes] [Right: Yes] Calf Left: Right: Point of Measurement: 30 cm From Medial Instep 37 cm 35 cm Ankle Left: Right: Point of Measurement: 7 cm From Medial Instep 24.7 cm 24 cm Knee To Floor Left: Right: From Medial Instep 36 cm 36 cm Vascular Assessment Pulses: Dorsalis Pedis Palpable: [Left:Yes] [Right:Yes] Notes Right ABI=Non Compressible Electronic Signature(s) Signed: 09/19/2021 6:07:33 PM By: Jane [name] Entered By: Jane [name] on 09/19/2021 09:17:49 -------------------------------------------------------------------------------- Multi Wound Chart Details Patient Name: Date of Service: Jane Nurse M. 09/19/2021 7:30 A M Medical Record Number: [id number] Patient Account Number: [id number] Date of Birth/Sex: Treating RN: [birth year] (55 y.o. Female) Primary Care Kayshaun Polanco: Jane [name] Other Clinician: Referring Antonya Leeder: Treating Marzetta Lanza/Extender: Jane [name] in Treatment: 0 Vital Signs Height(in): Pulse(bpm): 112 Weight(lbs): Blood Pressure(mmHg): 137/90 Body Mass Index(BMI): Temperature(F): 98.5 Respiratory Rate(breaths/min): 18 Photos: [N/A:N/A] Right, Medial Calcaneus N/A N/A Wound Location: Pressure Injury N/A N/A Wounding Event: Pressure Ulcer N/A N/A Primary Etiology: Cataracts, Anemia, Congestive Heart N/A N/A Comorbid History: Failure, Hypotension, End Stage Renal Disease, Seizure Disorder 06/22/2021 N/A N/A Date Acquired: 0 N/A N/A Weeks of Treatment: Open N/A N/A Wound Status: 1x1x0.3 N/A N/A Measurements L x W x D (cm) 0.785 N/A N/A A (cm) : rea 0.236 N/A N/A Volume (cm) : 0.00% N/A N/A % Reduction in A rea: 0.00% N/A N/A % Reduction in  Volume: Category/Stage II N/A N/A Classification: Medium N/A N/A Exudate A mount: Serosanguineous N/A N/A Exudate Type: red, brown N/A N/A Exudate Color: Distinct, outline attached N/A N/A Wound Margin: Small (1-33%) N/A N/A Granulation A mount: Large (67-100%) N/A N/A Necrotic A mount: Eschar, Adherent Slough N/A N/A Necrotic Tissue: Fat Layer (Subcutaneous Tissue): Yes N/A N/A Exposed Structures: Fascia: No Tendon: No Muscle: No Joint: No Bone: No None N/A N/A Epithelialization: Debridement - Excisional N/A N/A Debridement: Pre-procedure Verification/Time Out 08:54 N/A N/A Taken: Other N/A N/A Pain Control: Necrotic/Eschar, Subcutaneous, N/A N/A Tissue Debrided: Slough Skin/Subcutaneous Tissue N/A N/A Level: 1 N/A N/A Debridement A (sq cm): rea Curette N/A N/A Instrument: Minimum N/A N/A Bleeding: Pressure N/A N/A Hemostasis Achieved: Procedure was tolerated well N/A N/A Debridement Treatment Response: 1x1x0.3 N/A N/A Post Debridement Measurements L x W  x D (cm) 0.236 N/A N/A Post Debridement Volume: (cm) Category/Stage II N/A N/A Post Debridement Stage: Debridement N/A N/A Procedures Performed: Treatment Notes Electronic Signature(s) Signed: 09/19/2021 9:32:13 AM By: [name] DO Entered By: [name] on 09/19/2021 09:18:22 -------------------------------------------------------------------------------- Multi-Disciplinary Care Plan Details Patient Name: Date of Service: Jane Nurse M. 09/19/2021 7:30 A M Medical Record Number: [id number] Patient Account Number: [id number] Date of Birth/Sex: Treating RN: 12-Mar-1966 (55 y.o. Female) Jane [name] Primary Care Brynn Mulgrew: Jane [name] Other Clinician: Referring Jamice Carreno: Treating Lavonta Tillis/Extender: Jane [name] in Treatment: 0 Active Inactive Pressure Nursing Diagnoses: Knowledge deficit related to causes and risk factors for pressure  ulcer development Goals: Patient will remain free from development of additional pressure ulcers Date Initiated: 09/19/2021 Target Resolution Date: 10/17/2021 Goal Status: Active Interventions: Assess: immobility, friction, shearing, incontinence upon admission and as needed Assess offloading mechanisms upon admission and as needed Treatment Activities: Pressure reduction/relief device ordered : 09/19/2021 T ordered outside of clinic : 09/19/2021 est Notes: Wound/Skin Impairment Nursing Diagnoses: Impaired tissue integrity Goals: Patient/caregiver will verbalize understanding of skin care regimen Date Initiated: 09/19/2021 Target Resolution Date: 10/17/2021 Goal Status: Active Ulcer/skin breakdown will have a volume reduction of 30% by week 4 Date Initiated: 09/19/2021 Target Resolution Date: 10/17/2021 Goal Status: Active Interventions: Assess patient/caregiver ability to obtain necessary supplies Assess patient/caregiver ability to perform ulcer/skin care regimen upon admission and as needed Assess ulceration(s) every visit Provide education on ulcer and skin care Treatment Activities: Topical wound management initiated : 09/19/2021 Notes: Electronic Signature(s) Signed: 09/19/2021 6:07:33 PM By: Jane [name] Previous Signature: 09/19/2021 8:44:44 AM Version By: Jane [name] Entered By: Jane [name] on 09/19/2021 09:04:25 -------------------------------------------------------------------------------- Pain Assessment Details Patient Name: Date of Service: Jane Nurse M. 09/19/2021 7:30 A M Medical Record Number: [id number] Patient Account Number: [id number] Date of Birth/Sex: Treating RN: 20-Jan-1966 (55 y.o. Female) Jane [name] Primary Care Ahmir Bracken: Jane [name] Other Clinician: Referring Marena Witts: Treating Emmerich Cryer/Extender: Jane [name] in Treatment: 0 Active Problems Location of Pain Severity and Description  of Pain Patient Has Paino No Site Locations Pain Management and Medication Current Pain Management: Electronic Signature(s) Signed: 09/19/2021 6:07:33 PM By: Jane [name] Entered By: Jane [name] on 09/19/2021 08:26:51 -------------------------------------------------------------------------------- Patient/Caregiver Education Details Patient Name: Date of Service: Samule Dry. 11/28/2022andnbsp7:30 A M Medical Record Number: [id number] Patient Account Number: [id number] Date of Birth/Gender: Treating RN: 03-May-1966 (55 y.o. Female) Jane [name] Primary Care Physician: Jane [name] Other Clinician: Referring Physician: Treating Physician/Extender: Jane [name] in Treatment: 0 Education Assessment Education Provided To: Caregiver Education Topics Provided Offloading: Methods: Explain/Verbal, Printed Responses: State content correctly Wound/Skin Impairment: Methods: Explain/Verbal, Printed Responses: State content correctly Electronic Signature(s) Signed: 09/19/2021 6:07:33 PM By: Jane [name] Entered By: Jane [name] on 09/19/2021 08:45:22 -------------------------------------------------------------------------------- Wound Assessment Details Patient Name: Date of Service: Jane Nurse M. 09/19/2021 7:30 A M Medical Record Number: [id number] Patient Account Number: [id number] Date of Birth/Sex: Treating RN: [birth year] (55 y.o. Female) Jane [name] Primary Care Heath Tesler: Jane [name] Other Clinician: Referring Kwasi Joung: Treating Joliet Mallozzi/Extender: Jane [name] in Treatment: 0 Wound Status Wound Number: 1 Primary Pressure Ulcer Etiology: Wound Location: Right, Medial Calcaneus Wound Open Wounding Event: Pressure Injury Status: Date Acquired: 06/22/2021 Comorbid Cataracts, Anemia, Congestive Heart Failure, Hypotension, End Weeks Of Treatment: 0 History: Stage Renal  Disease, Seizure Disorder Clustered Wound: No Photos Wound Measurements Length: (cm) 1 Width: (cm) 1 Depth: (cm) 0.3 Area: (cm) 0.785 Volume: (cm) 0.236 %  Reduction in Area: 0% % Reduction in Volume: 0% Epithelialization: None Tunneling: No Undermining: No Wound Description Classification: Category/Stage II Wound Margin: Distinct, outline attached Exudate Amount: Medium Exudate Type: Serosanguineous Exudate Color: red, brown Wound Bed Granulation Amount: Small (1-33%) Necrotic Amount: Large (67-100%) Necrotic Quality: Eschar, Adherent Slough Foul Odor After Cleansing: No Slough/Fibrino Yes Exposed Structure Fascia Exposed: No Fat Layer (Subcutaneous Tissue) Exposed: Yes Tendon Exposed: No Muscle Exposed: No Joint Exposed: No Bone Exposed: No Treatment Notes Wound #1 (Calcaneus) Wound Laterality: Right, Medial Cleanser Soap and Water Discharge Instruction: May shower and wash wound with dial antibacterial soap and water prior to dressing change. Peri-Wound Care Skin Prep Discharge Instruction: Use skin prep as directed Topical Primary Dressing Hydrofera Blue Ready Foam, 2.5 x2.5 in Discharge Instruction: Apply to wound bed as instructed Secondary Dressing Bordered Gauze, 4x4 in Discharge Instruction: Apply over primary dressing as directed. Secured With Compression Wrap Compression Stockings Environmental education officer) Signed: 09/19/2021 6:07:33 PM By: Jane [name] Entered By: Jane [name] on 09/19/2021 09:06:57 -------------------------------------------------------------------------------- Vitals Details Patient Name: Date of Service: Jane Nurse M. 09/19/2021 7:30 A M Medical Record Number: [id number] Patient Account Number: [id number] Date of Birth/Sex: Treating RN: [birth year] (55 y.o. Female) Jane [name] Primary Care Makeila Yamaguchi: Jane [name] Other Clinician: Referring Jasdeep Kepner: Treating Tereka Thorley/Extender: Jane [name] in Treatment: 0 Vital Signs Time Taken: 08:08 Temperature (F): 98.5 Pulse (bpm): 112 Respiratory Rate (breaths/min): 18 Blood Pressure (mmHg): 137/90 Reference Range: 80 - 120 mg / dl Electronic Signature(s) Signed: 09/19/2021 6:07:33 PM By: Jane [name] Entered By: Jane [name] on 09/19/2021 08:09:13

## 2021-09-19 NOTE — Progress Notes (Signed)
Jane [name] [name] ([id number]) Visit Report for 09/19/2021 Chief Complaint Document Details Patient Name: Date of Service: Jane [name] [name], Jane [name] [name]. 09/19/2021 7:30 A M Medical Record Number: [id number] Patient Account Number: [id number] Date of Birth/Sex: Treating RN: July 12, 1966 (55 y.o. Female) Primary Care Provider: [name] Other Clinician: Referring Provider: Treating Provider/Extender: Carlus Pavlov in Treatment: 0 Information Obtained from: Patient Chief Complaint Right heel wound Electronic Signature(s) Signed: 09/19/2021 9:32:13 AM By: [name] DO Entered By: [name] on 09/19/2021 09:18:36 -------------------------------------------------------------------------------- Debridement Details Patient Name: Date of Service: Jane [name] Nurse M. 09/19/2021 7:30 A M Medical Record Number: [id number] Patient Account Number: [id number] Date of Birth/Sex: Treating RN: 01/05/1966 (55 y.o. Female) Lorrin Jackson Primary Care Provider: [name] Other Clinician: Referring Provider: Treating Provider/Extender: Carlus Pavlov in Treatment: 0 Debridement Performed for Assessment: Wound #1 Right,Medial Calcaneus Performed By: Physician [name], DO Debridement Type: Debridement Level of Consciousness (Pre-procedure): Awake and Alert Pre-procedure Verification/Time Out Yes - 08:54 Taken: Start Time: 08:55 Pain Control: Other : Benzocaine T Area Debrided (L x W): otal 1 (cm) x 1 (cm) = 1 (cm) Tissue and other material debrided: Non-Viable, Eschar, Slough, Subcutaneous, Slough Level: Skin/Subcutaneous Tissue Debridement Description: Excisional Instrument: Curette Bleeding: Minimum Hemostasis Achieved: Pressure End Time: 09:00 Response to Treatment: Procedure was tolerated well Level of Consciousness (Post- Awake and Alert procedure): Post Debridement Measurements of Total  Wound Length: (cm) 1 Stage: Category/Stage II Width: (cm) 1 Depth: (cm) 0.3 Volume: (cm) 0.236 Character of Wound/Ulcer Post Debridement: Stable Post Procedure Diagnosis Same as Pre-procedure Electronic Signature(s) Signed: 09/19/2021 9:32:13 AM By: [name] DO Signed: 09/19/2021 6:07:33 PM By: Lorrin Jackson Entered By: Lorrin Jackson on 09/19/2021 09:00:33 -------------------------------------------------------------------------------- HPI Details Patient Name: Date of Service: Jane [name] Nurse M. 09/19/2021 7:30 A M Medical Record Number: [id number] Patient Account Number: [id number] Date of Birth/Sex: Treating RN: Aug 26, 1966 (55 y.o. Female) Primary Care Provider: [name] Other Clinician: Referring Provider: Treating Provider/Extender: Carlus Pavlov in Treatment: 0 History of Present Illness HPI Description: Admission 09/19/2021 Jane [name] [name] is a 55 year old female with a past medical history of schizoaffective disorder, end-stage renal disease on peritoneal dialysis and seizure disorder that presents to the clinic for a right heel wound that has been present since her hospitalization in April 2022 because of an epidural abscess that required surgery. During her hospitalization she developed the heel wound and a breast wound. She would lean more aggressively to the left side for long periods of time. The breast wound has healed. The right heel Has developed a loose callus. She is not mobile and is either in her wheelchair or in the bed most of the day. She currently denies pain, increased warmth or erythema to the area or drainage. Son-in-law is present and helps with the history. Patient is unable to participate fully in her care due to her diagnosis of schizoaffective disorder. Electronic Signature(s) Signed: 09/19/2021 9:32:13 AM By: [name] DO Entered By: [name] on 09/19/2021  09:25:00 -------------------------------------------------------------------------------- Physical Exam Details Patient Name: Date of Service: Jane [name] [name], Jane [name] M. 09/19/2021 7:30 A M Medical Record Number: [id number] Patient Account Number: [id number] Date of Birth/Sex: Treating RN: 1965/11/25 (55 y.o. Female) Primary Care Provider: [name] Other Clinician: Referring Provider: Treating Provider/Extender: Carlus Pavlov in Treatment: 0 Constitutional respirations regular, non-labored and within target range for patient.. Cardiovascular 2+ dorsalis pedis/posterior tibialis pulses. Psychiatric pleasant and cooperative. Notes Right heel: Loose callus.  After removal there is an open wound with maceration and dark granulation tissue present. No obvious signs of infection. Electronic Signature(s) Signed: 09/19/2021 9:32:13 AM By: [name] DO Entered By: [name] on 09/19/2021 09:25:58 -------------------------------------------------------------------------------- Physician Orders Details Patient Name: Date of Service: Jane [name] Nurse M. 09/19/2021 7:30 A M Medical Record Number: [id number] Patient Account Number: [id number] Date of Birth/Sex: Treating RN: 1966/07/19 (55 y.o. Female) Lorrin Jackson Primary Care Provider: [name] Other Clinician: Referring Provider: Treating Provider/Extender: Carlus Pavlov in Treatment: 0 Verbal / Phone Orders: No Diagnosis Coding Follow-up Appointments ppointment in 1 week. - with Dr. Heber [place] Return A Bathing/ Shower/ Hygiene May shower and wash wound with soap and water. - when changing dressing Edema Control - Lymphedema / SCD / Other Elevate legs to the level of the heart or above for 30 minutes daily and/or when sitting, a frequency of: Off-Loading Other: - Prevalon Boot to right foot Additional Orders / Instructions Follow Nutritious  Diet Wound Treatment Wound #1 - Calcaneus Wound Laterality: Right, Medial Cleanser: Soap and Water 1 x Per Day/30 Days Discharge Instructions: May shower and wash wound with dial antibacterial soap and water prior to dressing change. Peri-Wound Care: Skin Prep 1 x Per Day/30 Days Discharge Instructions: Use skin prep as directed Prim Dressing: Hydrofera Blue Ready Foam, 2.5 x2.5 in 1 x Per Day/30 Days ary Discharge Instructions: Apply to wound bed as instructed Secondary Dressing: Bordered Gauze, 4x4 in 1 x Per Day/30 Days Discharge Instructions: Apply over primary dressing as directed. Radiology X-ray, foot: Right Foot/Heel - Non-healing Wound to right heel Services and Therapies Ankle Brachial Index (ABI) and [id number] Electronic Signature(s) Signed: 09/19/2021 1:24:03 PM By: [name] DO Signed: 09/19/2021 6:07:33 PM By: Lorrin Jackson Previous Signature: 09/19/2021 9:32:13 AM Version By: [name] DO Entered By: Lorrin Jackson on 09/19/2021 13:08:39 Prescription 09/19/2021 -------------------------------------------------------------------------------- [name]. [name] DO Patient Name: Provider: Dec 27, 1965 [id number] Date of Birth: NPI#: Female [id number] Sex: DEA#: [phone] Phone #: License #: [place] Patient Address: [facility] [address] [place], [place] [address] [place], [place] [address] [phone] Allergies No Known Allergies Provider's Orders X-ray, foot: Right Foot/Heel - Non-healing Wound to right heel Hand Signature: Date(s): Prescription 09/19/2021 [name]. [name] DO Patient Name: Provider: 10/25/1965 [id number] Date of Birth: NPI#: Female [id number] Sex: DEA #: [phone] Phone #: License #: [place] Patient Address: [address] [place], [place] [address] [place], [place] [address] [phone] Allergies No Known Allergies Provider's Orders Ankle Brachial Index (ABI) and RDE:YCXKGYJEH Hand Signature: Date(s): Electronic Signature(s) Signed: 09/19/2021 1:24:03 PM By: [name] DO Signed: 09/19/2021 6:07:33 PM By: Lorrin Jackson Previous Signature: 09/19/2021 9:32:13 AM Version By: [name] DO Entered By: Lorrin Jackson on 09/19/2021 13:08:39 -------------------------------------------------------------------------------- Problem List Details Patient Name: Date of Service: Jane [name] Nurse M. 09/19/2021 7:30 A M Medical Record Number: [id number] Patient Account Number: [id number] Date of Birth/Sex: Treating RN: 1966-03-11 (55 y.o. Female) Primary Care Provider: [name] Other Clinician: Referring Provider: Treating Provider/Extender: Carlus Pavlov in Treatment: 0 Active Problems ICD-10 Encounter Code Description Active Date MDM Diagnosis L89.613 Pressure ulcer of right heel, stage 3 09/19/2021 No Yes F25.9 Schizoaffective disorder, unspecified 09/19/2021 No Yes N18.6 End stage renal disease 09/19/2021 No Yes Inactive Problems Resolved Problems Electronic Signature(s) Signed: 09/19/2021  9:32:13 AM By: [name] DO Entered By: [name] on 09/19/2021 09:18:05 -------------------------------------------------------------------------------- Progress Note Details Patient Name: Date of Service: Jane [name] Nurse M. 09/19/2021 7:30 A M Medical Record Number: [id number] Patient Account Number: [id number] Date of Birth/Sex: Treating RN: 08/10/1966 (55 y.o. Female) Primary Care Provider: [name] Other Clinician: Referring Provider: Treating Provider/Extender: Carlus Pavlov in Treatment: 0 Subjective Chief Complaint Information obtained from Patient Right heel wound History of  Present Illness (HPI) Admission 09/19/2021 Ms. Talana Slatten is a 55 year old female with a past medical history of schizoaffective disorder, end-stage renal disease on peritoneal dialysis and seizure disorder that presents to the clinic for a right heel wound that has been present since her hospitalization in April 2022 because of an epidural abscess that required surgery. During her hospitalization she developed the heel wound and a breast wound. She would lean more aggressively to the left side for long periods of time. The breast wound has healed. The right heel Has developed a loose callus. She is not mobile and is either in her wheelchair or in the bed most of the day. She currently denies pain, increased warmth or erythema to the area or drainage. Son-in-law is present and helps with the history. Patient is unable to participate fully in her care due to her diagnosis of schizoaffective disorder. Patient History Information obtained from Patient, Caregiver. Allergies No Known Allergies Family History Cancer - Mother, Kidney Disease - Maternal Grandparents, No family history of Diabetes, Heart Disease, Hereditary Spherocytosis, Hypertension, Lung Disease, Seizures, Stroke, Thyroid Problems, Tuberculosis. Social History Never smoker, Marital Status - Single, Alcohol Use - Never, Drug Use - No History, Caffeine Use - Rarely. Medical History Eyes Patient has history of Cataracts Hematologic/Lymphatic Patient has history of Anemia Cardiovascular Patient has history of Congestive Heart Failure, Hypotension Denies history of Hypertension Genitourinary Patient has history of End Stage Renal Disease - Dialysis Neurologic Patient has history of Seizure Disorder - last seizure several years ago Medical A Surgical History Notes nd Endocrine Hypothyroidism Musculoskeletal Laminectomy 05/2021 Psychiatric Schizoaffective Disorder Review of Systems (ROS) Ear/Nose/Mouth/Throat Denies  complaints or symptoms of Chronic sinus problems or rhinitis. Respiratory Complains or has symptoms of Shortness of Breath. Gastrointestinal Denies complaints or symptoms of Frequent diarrhea, Nausea, Vomiting. Endocrine Denies complaints or symptoms of Heat/cold intolerance. Integumentary (Skin) Complains or has symptoms of Wounds. Musculoskeletal Denies complaints or symptoms of Muscle Pain, Muscle Weakness. Neurologic Denies complaints or symptoms of Numbness/parasthesias. Objective Constitutional respirations regular, non-labored and within target range for patient.. Vitals Time Taken: 8:08 AM, Temperature: 98.5 F, Pulse: 112 bpm, Respiratory Rate: 18 breaths/min, Blood Pressure: 137/90 mmHg. Cardiovascular 2+ dorsalis pedis/posterior tibialis pulses. Psychiatric pleasant and cooperative. General Notes: Right heel: Loose callus. After removal there is an open wound with maceration and dark granulation tissue present. No obvious signs of infection. Integumentary (Hair, Skin) Wound #1 status is Open. Original cause of wound was Pressure Injury. The date acquired was: 06/22/2021. The wound is located on the Right,Medial Calcaneus. The wound measures 1cm length x 1cm width x 0.3cm depth; 0.785cm^2 area and 0.236cm^3 volume. There is Fat Layer (Subcutaneous Tissue) exposed. There is no tunneling or undermining noted. There is a medium amount of serosanguineous drainage noted. The wound margin is distinct with the outline attached to the wound base. There is small (1-33%) granulation within the wound bed. There is a large (67-100%) amount of necrotic tissue within the wound bed including Eschar and Adherent Slough. Assessment Active Problems ICD-10 Pressure  ulcer of right heel, stage 3 Schizoaffective disorder, unspecified End stage renal disease Patient presents with a nonhealing wound to her right heel for the past 7 months. This appears to have been started by a pressure injury  During her hospitalization in April. I debrided nonviable tissue. There were no obvious signs of infection on exam. Due to the chronicity of the wound I would like to obtain an x-ray. ABIs were noncompressibleand we will follow these up with formal ABIs with TBI's. I recommended aggressive offloading to the areas with Prevalon boots. Follow-up in 1 week. 47 minutes was spent on the encounter including face-to-face, EMR review and coordination of care Procedures Wound #1 Pre-procedure diagnosis of Wound #1 is a Pressure Ulcer located on the Right,Medial Calcaneus . There was a Excisional Skin/Subcutaneous Tissue Debridement with a total area of 1 sq cm performed by [name], DO. With the following instrument(s): Curette to remove Non-Viable tissue/material. Material removed includes Eschar, Subcutaneous Tissue, and Slough after achieving pain control using Other (Benzocaine). No specimens were taken. A time out was conducted at 08:54, prior to the start of the procedure. A Minimum amount of bleeding was controlled with Pressure. The procedure was tolerated well. Post Debridement Measurements: 1cm length x 1cm width x 0.3cm depth; 0.236cm^3 volume. Post debridement Stage noted as Category/Stage II. Character of Wound/Ulcer Post Debridement is stable. Post procedure Diagnosis Wound #1: Same as Pre-Procedure Plan Follow-up Appointments: Return Appointment in 1 week. - with Dr. Heber [place] Bathing/ Shower/ Hygiene: May shower and wash wound with soap and water. - when changing dressing Edema Control - Lymphedema / SCD / Other: Elevate legs to the level of the heart or above for 30 minutes daily and/or when sitting, a frequency of: Off-Loading: Other: - Prevalon Boot to right foot Additional Orders / Instructions: Follow Nutritious Diet Radiology ordered were: X-ray, foot: Right Foot/Heel - Non-healing Wound to right heel WOUND #1: - Calcaneus Wound Laterality: Right, Medial Cleanser: Soap  and Water 1 x Per Day/30 Days Discharge Instructions: May shower and wash wound with dial antibacterial soap and water prior to dressing change. Peri-Wound Care: Skin Prep 1 x Per Day/30 Days Discharge Instructions: Use skin prep as directed Prim Dressing: Hydrofera Blue Ready Foam, 2.5 x2.5 in 1 x Per Day/30 Days ary Discharge Instructions: Apply to wound bed as instructed Secondary Dressing: Bordered Gauze, 4x4 in 1 x Per Day/30 Days Discharge Instructions: Apply over primary dressing as directed. 1. In office sharp debridement 2. Prevalon boots and aggressive offloading 3. Right foot x-ray 4. ABIs with TBI's Electronic Signature(s) Signed: 09/19/2021 9:32:13 AM By: [name] DO Entered By: [name] on 09/19/2021 09:31:18 -------------------------------------------------------------------------------- HxROS Details Patient Name: Date of Service: Jane [name] Nurse M. 09/19/2021 7:30 A M Medical Record Number: [id number] Patient Account Number: [id number] Date of Birth/Sex: Treating RN: 1966/06/08 (55 y.o. Female) Lorrin Jackson Primary Care Provider: [name] Other Clinician: Referring Provider: Treating Provider/Extender: Carlus Pavlov in Treatment: 0 Information Obtained From Patient Caregiver Ear/Nose/Mouth/Throat Complaints and Symptoms: Negative for: Chronic sinus problems or rhinitis Respiratory Complaints and Symptoms: Positive for: Shortness of Breath Gastrointestinal Complaints and Symptoms: Negative for: Frequent diarrhea; Nausea; Vomiting Endocrine Complaints and Symptoms: Negative for: Heat/cold intolerance Medical History: Past Medical History Notes: Hypothyroidism Integumentary (Skin) Complaints and Symptoms: Positive for: Wounds Musculoskeletal Complaints and Symptoms: Negative for: Muscle Pain; Muscle Weakness Medical History: Past Medical History Notes: Laminectomy  05/2021 Neurologic Complaints and Symptoms: Negative for: Numbness/parasthesias Medical History: Positive for: Seizure Disorder -  last seizure several years ago Eyes Medical History: Positive for: Cataracts Hematologic/Lymphatic Medical History: Positive for: Anemia Cardiovascular Medical History: Positive for: Congestive Heart Failure; Hypotension Negative for: Hypertension Genitourinary Medical History: Positive for: End Stage Renal Disease - Dialysis Immunological Oncologic Psychiatric Medical History: Past Medical History Notes: Schizoaffective Disorder HBO Extended History Items Eyes: Cataracts Immunizations Pneumococcal Vaccine: Received Pneumococcal Vaccination: No Implantable Devices Yes Family and Social History Cancer: Yes - Mother; Diabetes: No; Heart Disease: No; Hereditary Spherocytosis: No; Hypertension: No; Kidney Disease: Yes - Maternal Grandparents; Lung Disease: No; Seizures: No; Stroke: No; Thyroid Problems: No; Tuberculosis: No; Never smoker; Marital Status - Single; Alcohol Use: Never; Drug Use: No History; Caffeine Use: Rarely; Financial Concerns: No; Food, Clothing or Shelter Needs: No; Support System Lacking: No; Transportation Concerns: No Electronic Signature(s) Signed: 09/19/2021 9:32:13 AM By: [name] DO Signed: 09/19/2021 6:07:33 PM By: Lorrin Jackson Entered By: Lorrin Jackson on 09/19/2021 08:44:09 -------------------------------------------------------------------------------- SuperBill Details Patient Name: Date of Service: Jane [name] Nurse M. 09/19/2021 Medical Record Number: [id number] Patient Account Number: [id number] Date of Birth/Sex: Treating RN: May 10, 1966 (55 y.o. Female) Lorrin Jackson Primary Care Provider: [name] Other Clinician: Referring Provider: Treating Provider/Extender: Carlus Pavlov in Treatment: 0 Diagnosis Coding ICD-10 Codes Code Description [phone]  Pressure ulcer of right heel, stage 3 F25.9 Schizoaffective disorder, unspecified N18.6 End stage renal disease Facility Procedures CPT4 Code: [id number] Description: 85027 - WOUND CARE VISIT-LEV 3 EST PT Modifier: 25 Quantity: 1 CPT4 Code: [id number] Description: 76720 - DEB SUBQ TISSUE 20 SQ CM/< ICD-10 Diagnosis Description L89.613 Pressure ulcer of right heel, stage 3 Modifier: Quantity: 1 Physician Procedures : CPT4 Code Description Modifier [id number] 83662 - WC PHYS LEVEL 4 - NEW PT ICD-10 Diagnosis Description L89.613 Pressure ulcer of right heel, stage 3 F25.9 Schizoaffective disorder, unspecified N18.6 End stage renal disease Quantity: 1 : [id number] 11042 - WC PHYS SUBQ TISS 20 SQ CM ICD-10 Diagnosis Description L89.613 Pressure ulcer of right heel, stage 3 Quantity: 1 Electronic Signature(s) Signed: 09/19/2021 9:32:13 AM By: [name] DO Entered By: [name] on 09/19/2021 09:31:33

## 2021-09-19 NOTE — Progress Notes (Signed)
Jane [name], Jane [name] ([id number]) Visit Report for 09/19/2021 Abuse/Suicide Risk Screen Details Patient Name: Date of Service: Jane [name], Jane [name]. 09/19/2021 7:30 A M Medical Record Number: [id number] Patient Account Number: [id number] Date of Birth/Sex: Treating RN: 1965-12-18 (55 y.o. Female) Lorrin Jackson Primary Care Mynor Witkop: [name] Other Clinician: Referring Marquette Blodgett: Treating Arul Farabee/Extender: Carlus Pavlov in Treatment: 0 Abuse/Suicide Risk Screen Items Answer ABUSE RISK SCREEN: Has anyone close to you tried to hurt or harm you recentlyo No Do you feel uncomfortable with anyone in your familyo No Has anyone forced you do things that you didnt want to doo No Electronic Signature(s) Signed: 09/19/2021 6:07:33 PM By: Lorrin Jackson Entered By: Lorrin Jackson on 09/19/2021 08:06:29 -------------------------------------------------------------------------------- Activities of Daily Living Details Patient Name: Date of Service: Jane [name], Jane [name]. 09/19/2021 7:30 A M Medical Record Number: [id number] Patient Account Number: [id number] Date of Birth/Sex: Treating RN: 1966-02-04 (55 y.o. Female) Lorrin Jackson Primary Care Demyah Smyre: [name] Other Clinician: Referring Yarrow Linhart: Treating Keilin Gamboa/Extender: Carlus Pavlov in Treatment: 0 Activities of Daily Living Items Answer Activities of Daily Living (Please select one for each item) Drive Automobile Not Able T Medications ake Need Assistance Use T elephone Need Assistance Care for Appearance Need Assistance Use T oilet Completely Able Bath / Shower Need Assistance Dress Self Not Able Feed Self Completely Able Walk Need Assistance Get In / Out Bed Not Able Housework Not Able Prepare Meals Not Able Handle Money Need Assistance Shop for Self Not Able Electronic Signature(s) Signed: 09/19/2021 6:07:33 PM By: Lorrin Jackson Entered By:  Lorrin Jackson on 09/19/2021 08:07:30 -------------------------------------------------------------------------------- Education Screening Details Patient Name: Date of Service: Jane Nurse M. 09/19/2021 7:30 A M Medical Record Number: [id number] Patient Account Number: [id number] Date of Birth/Sex: Treating RN: 08-Oct-1966 (55 y.o. Female) Lorrin Jackson Primary Care Aliz Meritt: [name] Other Clinician: Referring Ellinore Merced: Treating Phiona Ramnauth/Extender: Carlus Pavlov in Treatment: 0 Primary Learner Assessed: Patient Learning Preferences/Education Level/Primary Language Learning Preference: Explanation, Demonstration, Printed Material Highest Education Level: Grade School Preferred Language: English Cognitive Barrier Language Barrier: No Translator Needed: No Memory Deficit: No Emotional Barrier: Yes Cultural/Religious Beliefs Affecting Medical Care: No Physical Barrier Impaired Vision: Yes Impaired Hearing: No Decreased Hand dexterity: No Knowledge/Comprehension Knowledge Level: Low Comprehension Level: Low Ability to understand written instructions: Low Ability to understand verbal instructions: Low Motivation Anxiety Level: Calm Cooperation: Cooperative Education Importance: Acknowledges Need Interest in Health Problems: Uninterested Willingness to Engage in [facility] Activities: Readiness to Engage in [place] Activities: Electronic Signature(s) Signed: 09/19/2021 6:07:33 PM By: Lorrin Jackson Entered By: Lorrin Jackson on 09/19/2021 08:08:44 -------------------------------------------------------------------------------- Fall Risk Assessment Details Patient Name: Date of Service: Jane Nurse M. 09/19/2021 7:30 A M Medical Record Number: [id number] Patient Account Number: [id number] Date of Birth/Sex: Treating RN: Mar 07, 1966 (55 y.o. Female) Lorrin Jackson Primary Care Jayle Solarz: [name] Other Clinician: Referring Akeen Ledyard: Treating Feven Alderfer/Extender: Carlus Pavlov in Treatment: 0 Fall Risk Assessment Items Have you had 2 or more falls in the last 12 monthso 0 No Have you had any fall that resulted in injury in the last 12 monthso 0 No FALLS RISK SCREEN History of falling - immediate or within 3 months 0 No Secondary diagnosis (Do you have 2 or more medical diagnoseso) 15 Yes Ambulatory aid None/bed rest/wheelchair/nurse 0 Yes Crutches/cane/walker 0 No Furniture 0 No Intravenous therapy Access/Saline/Heparin Lock 0 No Gait/Transferring Normal/ bed rest/ wheelchair 0 Yes Weak (short steps with or  without shuffle, stooped but able to lift head while walking, may seek 0 No support from furniture) Impaired (short steps with shuffle, may have difficulty arising from chair, head down, impaired 0 No balance) Mental Status Oriented to own ability 0 Yes Electronic Signature(s) Signed: 09/19/2021 6:07:33 PM By: Lorrin Jackson Entered By: Lorrin Jackson on 09/19/2021 08:09:52 -------------------------------------------------------------------------------- Foot Assessment Details Patient Name: Date of Service: Jane Nurse M. 09/19/2021 7:30 A M Medical Record Number: [id number] Patient Account Number: [id number] Date of Birth/Sex: Treating RN: 04-Dec-1965 (55 y.o. Female) Lorrin Jackson Primary Care Maylen Waltermire: [name] Other Clinician: Referring Zoraida Havrilla: Treating Resean Brander/Extender: Carlus Pavlov in Treatment: 0 Foot Assessment Items [x]  Unable to perform due to altered mental status Site Locations + = Sensation present, - = Sensation absent, C = Callus, U = Ulcer R = Redness, W = Warmth, M = Maceration, PU = Pre-ulcerative lesion F = Fissure, S = Swelling, D = Dryness Assessment Right: Left: Other Deformity: No No Prior Foot Ulcer: No No Prior Amputation: No No Charcot Joint: No  No Ambulatory Status: Non-ambulatory Assistance Device: Wheelchair Gait: Electronic Signature(s) Signed: 09/19/2021 6:07:33 PM By: Lorrin Jackson Entered By: Lorrin Jackson on 09/19/2021 08:22:46 -------------------------------------------------------------------------------- Nutrition Risk Screening Details Patient Name: Date of Service: Jane [name], Jane [name] 09/19/2021 7:30 A M Medical Record Number: [id number] Patient Account Number: [id number] Date of Birth/Sex: Treating RN: [birth year] (55 y.o. Female) Lorrin Jackson Primary Care Indie Boehne: [name] Other Clinician: Referring Andreka Stucki: Treating Shamonique Battiste/Extender: Carlus Pavlov in Treatment: 0 Height (in): Weight (lbs): Body Mass Index (BMI): Nutrition Risk Screening Items Score Screening NUTRITION RISK SCREEN: I have an illness or condition that made me change the kind and/or amount of food I eat 0 No I eat fewer than two meals per day 0 No I eat few fruits and vegetables, or milk products 0 No I have three or more drinks of beer, liquor or wine almost every day 0 No I have tooth or mouth problems that make it hard for me to eat 0 No I don't always have enough money to buy the food I need 0 No I eat alone most of the time 0 No I take three or more different prescribed or over-the-counter drugs a day 1 Yes Without wanting to, I have lost or gained 10 pounds in the last six months 2 Yes I am not always physically able to shop, cook and/or feed myself 0 No Nutrition Protocols Good Risk Protocol Moderate Risk Protocol 0 Provide education on nutrition High Risk Proctocol Risk Level: Moderate Risk Score: 3 Electronic Signature(s) Signed: 09/19/2021 6:07:33 PM By: Lorrin Jackson Entered By: Lorrin Jackson on 09/19/2021 08:11:07

## 2021-09-20 DIAGNOSIS — N186 End stage renal disease: Secondary | ICD-10-CM | POA: Diagnosis not present

## 2021-09-20 DIAGNOSIS — L89613 Pressure ulcer of right heel, stage 3: Secondary | ICD-10-CM | POA: Diagnosis not present

## 2021-09-20 DIAGNOSIS — B9562 Methicillin resistant Staphylococcus aureus infection as the cause of diseases classified elsewhere: Secondary | ICD-10-CM | POA: Diagnosis not present

## 2021-09-20 DIAGNOSIS — K652 Spontaneous bacterial peritonitis: Secondary | ICD-10-CM | POA: Diagnosis not present

## 2021-09-20 DIAGNOSIS — N2581 Secondary hyperparathyroidism of renal origin: Secondary | ICD-10-CM | POA: Diagnosis not present

## 2021-09-20 DIAGNOSIS — R627 Adult failure to thrive: Secondary | ICD-10-CM | POA: Diagnosis not present

## 2021-09-20 DIAGNOSIS — I959 Hypotension, unspecified: Secondary | ICD-10-CM | POA: Diagnosis not present

## 2021-09-20 DIAGNOSIS — D631 Anemia in chronic kidney disease: Secondary | ICD-10-CM | POA: Diagnosis not present

## 2021-09-20 DIAGNOSIS — T8571XD Infection and inflammatory reaction due to peritoneal dialysis catheter, subsequent encounter: Secondary | ICD-10-CM | POA: Diagnosis not present

## 2021-09-21 DIAGNOSIS — N186 End stage renal disease: Secondary | ICD-10-CM | POA: Diagnosis not present

## 2021-09-21 DIAGNOSIS — N2581 Secondary hyperparathyroidism of renal origin: Secondary | ICD-10-CM | POA: Diagnosis not present

## 2021-09-21 DIAGNOSIS — D631 Anemia in chronic kidney disease: Secondary | ICD-10-CM | POA: Diagnosis not present

## 2021-09-22 ENCOUNTER — Other Ambulatory Visit: Payer: Self-pay

## 2021-09-22 ENCOUNTER — Other Ambulatory Visit (HOSPITAL_COMMUNITY): Payer: Self-pay | Admitting: Internal Medicine

## 2021-09-22 ENCOUNTER — Ambulatory Visit (HOSPITAL_COMMUNITY)
Admission: RE | Admit: 2021-09-22 | Discharge: 2021-09-22 | Disposition: A | Discharge status: Home / Self Care | Payer: Medicare Other | Source: Ambulatory Visit | Attending: Internal Medicine | Admitting: Internal Medicine

## 2021-09-22 DIAGNOSIS — M7731 Calcaneal spur, right foot: Secondary | ICD-10-CM | POA: Diagnosis not present

## 2021-09-22 DIAGNOSIS — L98499 Non-pressure chronic ulcer of skin of other sites with unspecified severity: Secondary | ICD-10-CM

## 2021-09-22 DIAGNOSIS — D631 Anemia in chronic kidney disease: Secondary | ICD-10-CM | POA: Diagnosis not present

## 2021-09-22 DIAGNOSIS — S91301A Unspecified open wound, right foot, initial encounter: Secondary | ICD-10-CM | POA: Diagnosis not present

## 2021-09-22 DIAGNOSIS — Z23 Encounter for immunization: Secondary | ICD-10-CM | POA: Diagnosis not present

## 2021-09-22 DIAGNOSIS — N2581 Secondary hyperparathyroidism of renal origin: Secondary | ICD-10-CM | POA: Diagnosis not present

## 2021-09-22 DIAGNOSIS — N186 End stage renal disease: Secondary | ICD-10-CM | POA: Diagnosis not present

## 2021-09-23 DIAGNOSIS — N186 End stage renal disease: Secondary | ICD-10-CM | POA: Diagnosis not present

## 2021-09-23 DIAGNOSIS — L89613 Pressure ulcer of right heel, stage 3: Secondary | ICD-10-CM | POA: Diagnosis not present

## 2021-09-23 DIAGNOSIS — N2581 Secondary hyperparathyroidism of renal origin: Secondary | ICD-10-CM | POA: Diagnosis not present

## 2021-09-23 DIAGNOSIS — B9562 Methicillin resistant Staphylococcus aureus infection as the cause of diseases classified elsewhere: Secondary | ICD-10-CM | POA: Diagnosis not present

## 2021-09-23 DIAGNOSIS — K652 Spontaneous bacterial peritonitis: Secondary | ICD-10-CM | POA: Diagnosis not present

## 2021-09-23 DIAGNOSIS — T8571XD Infection and inflammatory reaction due to peritoneal dialysis catheter, subsequent encounter: Secondary | ICD-10-CM | POA: Diagnosis not present

## 2021-09-23 DIAGNOSIS — R627 Adult failure to thrive: Secondary | ICD-10-CM | POA: Diagnosis not present

## 2021-09-23 DIAGNOSIS — D631 Anemia in chronic kidney disease: Secondary | ICD-10-CM | POA: Diagnosis not present

## 2021-09-23 DIAGNOSIS — I959 Hypotension, unspecified: Secondary | ICD-10-CM | POA: Diagnosis not present

## 2021-09-23 DIAGNOSIS — Z23 Encounter for immunization: Secondary | ICD-10-CM | POA: Diagnosis not present

## 2021-09-24 DIAGNOSIS — Z23 Encounter for immunization: Secondary | ICD-10-CM | POA: Diagnosis not present

## 2021-09-24 DIAGNOSIS — N186 End stage renal disease: Secondary | ICD-10-CM | POA: Diagnosis not present

## 2021-09-24 DIAGNOSIS — N2581 Secondary hyperparathyroidism of renal origin: Secondary | ICD-10-CM | POA: Diagnosis not present

## 2021-09-24 DIAGNOSIS — D631 Anemia in chronic kidney disease: Secondary | ICD-10-CM | POA: Diagnosis not present

## 2021-09-25 DIAGNOSIS — N186 End stage renal disease: Secondary | ICD-10-CM | POA: Diagnosis not present

## 2021-09-25 DIAGNOSIS — D631 Anemia in chronic kidney disease: Secondary | ICD-10-CM | POA: Diagnosis not present

## 2021-09-25 DIAGNOSIS — N2581 Secondary hyperparathyroidism of renal origin: Secondary | ICD-10-CM | POA: Diagnosis not present

## 2021-09-25 DIAGNOSIS — Z23 Encounter for immunization: Secondary | ICD-10-CM | POA: Diagnosis not present

## 2021-09-26 ENCOUNTER — Other Ambulatory Visit: Payer: Self-pay

## 2021-09-26 ENCOUNTER — Other Ambulatory Visit (HOSPITAL_COMMUNITY): Payer: Self-pay | Admitting: Internal Medicine

## 2021-09-26 ENCOUNTER — Encounter (HOSPITAL_BASED_OUTPATIENT_CLINIC_OR_DEPARTMENT_OTHER): Payer: Medicare Other | Attending: Internal Medicine | Admitting: Internal Medicine

## 2021-09-26 DIAGNOSIS — D631 Anemia in chronic kidney disease: Secondary | ICD-10-CM | POA: Diagnosis not present

## 2021-09-26 DIAGNOSIS — L89613 Pressure ulcer of right heel, stage 3: Secondary | ICD-10-CM | POA: Insufficient documentation

## 2021-09-26 DIAGNOSIS — N186 End stage renal disease: Secondary | ICD-10-CM | POA: Insufficient documentation

## 2021-09-26 DIAGNOSIS — Z992 Dependence on renal dialysis: Secondary | ICD-10-CM | POA: Diagnosis not present

## 2021-09-26 DIAGNOSIS — Z23 Encounter for immunization: Secondary | ICD-10-CM | POA: Diagnosis not present

## 2021-09-26 DIAGNOSIS — N2581 Secondary hyperparathyroidism of renal origin: Secondary | ICD-10-CM | POA: Diagnosis not present

## 2021-09-27 DIAGNOSIS — N186 End stage renal disease: Secondary | ICD-10-CM | POA: Diagnosis not present

## 2021-09-27 DIAGNOSIS — Z23 Encounter for immunization: Secondary | ICD-10-CM | POA: Diagnosis not present

## 2021-09-27 DIAGNOSIS — N2581 Secondary hyperparathyroidism of renal origin: Secondary | ICD-10-CM | POA: Diagnosis not present

## 2021-09-27 DIAGNOSIS — D631 Anemia in chronic kidney disease: Secondary | ICD-10-CM | POA: Diagnosis not present

## 2021-09-27 NOTE — Progress Notes (Signed)
MONTEEN, TOOPS ([id number]) Visit Report for 09/26/2021 SuperBill Details Patient Name: Date of Service: Jane [name], Jane [name] 09/26/2021 Medical Record Number: [id number] Patient Account Number: [id number] Date of Birth/Sex: Treating RN: 1966-05-25 (55 y.o. [name] Primary Care Provider: [name] Other Clinician: Referring Provider: Treating Provider/Extender: Carlus Pavlov in Treatment: 1 Diagnosis Coding ICD-10 Codes Code Description 6184639038 Pressure ulcer of right heel, stage 3 F25.9 Schizoaffective disorder, unspecified N18.6 End stage renal disease Facility Procedures CPT4 Code Description Modifier Quantity [id number] 99212 - WOUND CARE VISIT-LEV 2 EST PT 1 Electronic Signature(s) Signed: 09/27/2021 3:40:48 PM By: Lorrin Jackson Signed: 09/27/2021 3:41:17 PM By: [name] DO Entered By: Lorrin Jackson on 09/26/2021 08:56:32

## 2021-09-27 NOTE — Progress Notes (Signed)
Jane [name], Jane [name] ([id number]) Visit Report for 09/26/2021 Arrival Information Details Patient Name: Date of Service: Jane [name], Jane [name]. 09/26/2021 8:45 A M Medical Record Number: [id number] Patient Account Number: [id number] Date of Birth/Sex: Treating RN: [birth year] (55 y.o. [name] Primary Care Annalee Meyerhoff: [name] Other Clinician: Referring Annaleah Arata: Treating Carmine Youngberg/Extender: Carlus Pavlov in Treatment: 1 Visit Information History Since Last Visit Added or deleted any medications: No Patient Arrived: Wheel Chair Any new allergies or adverse reactions: No Arrival Time: 08:46 Had a fall or experienced change in No Accompanied By: Brother in Law activities of daily living that may affect Transfer Assistance: None risk of falls: Patient Identification Verified: Yes Signs or symptoms of abuse/neglect since last visito No Secondary Verification Process Completed: Yes Hospitalized since last visit: No Patient Requires Transmission-Based Precautions: No Implantable device outside of the clinic excluding No Patient Has Alerts: Yes cellular tissue based products placed in the center Patient Alerts: R ABI=Non Comp since last visit: Has Dressing in Place as Prescribed: Yes Pain Present Now: No Electronic Signature(s) Signed: 09/27/2021 3:40:48 PM By: Lorrin Jackson Entered By: Lorrin Jackson on 09/26/2021 08:46:58 -------------------------------------------------------------------------------- Clinic Level of Care Assessment Details Patient Name: Date of Service: Jane [name], Jane [name]. 09/26/2021 8:45 A M Medical Record Number: [id number] Patient Account Number: [id number] Date of Birth/Sex: Treating RN: [birth year] (55 y.o. [name] Primary Care Aalyiah Camberos: [name] Other Clinician: Referring Stevana Dufner: Treating Ian Cavey/Extender: Carlus Pavlov in Treatment: 1 Clinic Level of Care  Assessment Items TOOL 4 Quantity Score X- 1 0 Use when only an EandM is performed on FOLLOW-UP visit ASSESSMENTS - Nursing Assessment / Reassessment X- 1 10 Reassessment of Co-morbidities (includes updates in patient status) X- 1 5 Reassessment of Adherence to Treatment Plan ASSESSMENTS - Wound and Skin A ssessment / Reassessment X - Simple Wound Assessment / Reassessment - one wound 1 5 []  - 0 Complex Wound Assessment / Reassessment - multiple wounds []  - 0 Dermatologic / Skin Assessment (not related to wound area) ASSESSMENTS - Focused Assessment []  - 0 Circumferential Edema Measurements - multi extremities []  - 0 Nutritional Assessment / Counseling / Intervention []  - 0 Lower Extremity Assessment (monofilament, tuning fork, pulses) []  - 0 Peripheral Arterial Disease Assessment (using hand held doppler) ASSESSMENTS - Ostomy and/or Continence Assessment and Care []  - 0 Incontinence Assessment and Management []  - 0 Ostomy Care Assessment and Management (repouching, etc.) PROCESS - Coordination of Care []  - 0 Simple Patient / Family Education for ongoing care X- 1 20 Complex (extensive) Patient / Family Education for ongoing care []  - 0 Staff obtains Programmer, systems, Records, T Results / Process Orders est []  - 0 Staff telephones HHA, Nursing Homes / Clarify orders / etc []  - 0 Routine Transfer to another Facility (non-emergent condition) []  - 0 Routine Hospital Admission (non-emergent condition) []  - 0 New Admissions / Biomedical engineer / Ordering NPWT Apligraf, etc. , []  - 0 Emergency Hospital Admission (emergent condition) []  - 0 Simple Discharge Coordination []  - 0 Complex (extensive) Discharge Coordination PROCESS - Special Needs []  - 0 Pediatric / Minor Patient Management []  - 0 Isolation Patient Management []  - 0 Hearing / Language / Visual special needs []  - 0 Assessment of Community assistance (transportation, D/C planning, etc.) []  - 0 Additional  assistance / Altered mentation []  - 0 Support Surface(s) Assessment (bed, cushion, seat, etc.) INTERVENTIONS - Wound Cleansing / Measurement X - Simple Wound Cleansing - one wound 1 5 []  -  0 Complex Wound Cleansing - multiple wounds []  - 0 Wound Imaging (photographs - any number of wounds) []  - 0 Wound Tracing (instead of photographs) X- 1 5 Simple Wound Measurement - one wound []  - 0 Complex Wound Measurement - multiple wounds INTERVENTIONS - Wound Dressings X - Small Wound Dressing one or multiple wounds 1 10 []  - 0 Medium Wound Dressing one or multiple wounds []  - 0 Large Wound Dressing one or multiple wounds []  - 0 Application of Medications - topical []  - 0 Application of Medications - injection INTERVENTIONS - Miscellaneous []  - 0 External ear exam []  - 0 Specimen Collection (cultures, biopsies, blood, body fluids, etc.) []  - 0 Specimen(s) / Culture(s) sent or taken to Lab for analysis []  - 0 Patient Transfer (multiple staff / Civil Service fast streamer / Similar devices) []  - 0 Simple Staple / Suture removal (25 or less) []  - 0 Complex Staple / Suture removal (26 or more) []  - 0 Hypo / Hyperglycemic Management (close monitor of Blood Glucose) []  - 0 Ankle / Brachial Index (ABI) - do not check if billed separately X- 1 5 Vital Signs Has the patient been seen at the hospital within the last three years: Yes Total Score: 65 Level Of Care: New/Established - Level 2 Electronic Signature(s) Signed: 09/27/2021 3:40:48 PM By: Lorrin Jackson Entered By: Lorrin Jackson on 09/26/2021 08:56:25 -------------------------------------------------------------------------------- Encounter Discharge Information Details Patient Name: Date of Service: Jane Nurse M. 09/26/2021 8:45 A M Medical Record Number: [id number] Patient Account Number: [id number] Date of Birth/Sex: Treating RN: 29-Jan-1966 (55 y.o. [name] Primary Care Ciana Simmon: [name] Other  Clinician: Referring Beckem Tomberlin: Treating Savayah Waltrip/Extender: Carlus Pavlov in Treatment: 1 Encounter Discharge Information Items Discharge Condition: Stable Ambulatory Status: Wheelchair Discharge Destination: Home Transportation: Private Auto Accompanied By: Johnathan Hausen in Law Schedule Follow-up Appointment: Yes Clinical Summary of Care: Provided on 09/26/2021 Form Type Recipient Paper Patient Patient Electronic Signature(s) Signed: 09/27/2021 3:40:48 PM By: Lorrin Jackson Entered By: Lorrin Jackson on 09/26/2021 08:55:53 -------------------------------------------------------------------------------- Patient/Caregiver Education Details Patient Name: Date of Service: Jane [name] 12/5/2022andnbsp8:45 A M Medical Record Number: [id number] Patient Account Number: [id number] Date of Birth/Gender: Treating RN: [birth year] (55 y.o. [name] Primary Care Physician: [name] Other Clinician: Referring Physician: Treating Physician/Extender: Carlus Pavlov in Treatment: 1 Education Assessment Education Provided To: Patient and Caregiver Education Topics Provided Offloading: Methods: Explain/Verbal, Printed Responses: State content correctly Wound/Skin Impairment: Methods: Explain/Verbal, Printed Responses: State content correctly Electronic Signature(s) Signed: 09/27/2021 3:40:48 PM By: Lorrin Jackson Entered By: Lorrin Jackson on 09/26/2021 08:54:25 -------------------------------------------------------------------------------- Wound Assessment Details Patient Name: Date of Service: Jane Nurse M. 09/26/2021 8:45 A M Medical Record Number: [id number] Patient Account Number: [id number] Date of Birth/Sex: Treating RN: Mar 11, 1966 (55 y.o. [name] Primary Care Charmane Protzman: [name] Other Clinician: Referring Zaivion Kundrat: Treating Prentis Langdon/Extender: Carlus Pavlov in Treatment: 1 Wound Status Wound Number: 1 Primary Pressure Ulcer Etiology: Wound Location: Right, Medial Calcaneus Wound Open Wounding Event: Pressure Injury Status: Date Acquired: 06/22/2021 Comorbid Cataracts, Anemia, Congestive Heart Failure, Hypotension, End Weeks Of Treatment: 1 History: Stage Renal Disease, Seizure Disorder Clustered Wound: No Wound Measurements Length: (cm) 1 Width: (cm) 1.1 Depth: (cm) 0.3 Area: (cm) 0.864 Volume: (cm) 0.259 % Reduction in Area: -10.1% % Reduction in Volume: -9.7% Epithelialization: None Tunneling: No Undermining: No Wound Description Classification: Category/Stage II Wound Margin: Distinct, outline attached Exudate Amount: Medium Exudate Type: Serosanguineous Exudate Color: red,  brown Foul Odor After Cleansing: No Slough/Fibrino Yes Wound Bed Granulation Amount: Large (67-100%) Exposed Structure Granulation Quality: Red, Pink Fascia Exposed: No Necrotic Amount: Small (1-33%) Fat Layer (Subcutaneous Tissue) Exposed: Yes Necrotic Quality: Adherent Slough Tendon Exposed: No Muscle Exposed: No Joint Exposed: No Bone Exposed: No Treatment Notes Wound #1 (Calcaneus) Wound Laterality: Right, Medial Cleanser Soap and Water Discharge Instruction: May shower and wash wound with dial antibacterial soap and water prior to dressing change. Peri-Wound Care Skin Prep Discharge Instruction: Use skin prep as directed Topical Primary Dressing Hydrofera Blue Ready Foam, 2.5 x2.5 in Discharge Instruction: Apply to wound bed as instructed Secondary Dressing Bordered Gauze, 4x4 in Discharge Instruction: Apply over primary dressing as directed. Secured With Compression Wrap Compression Stockings Environmental education officer) Signed: 09/27/2021 3:40:48 PM By: Lorrin Jackson Entered By: Lorrin Jackson on 09/26/2021 08:51:42 -------------------------------------------------------------------------------- Vitals  Details Patient Name: Date of Service: Jane Nurse M. 09/26/2021 8:45 A M Medical Record Number: [id number] Patient Account Number: [id number] Date of Birth/Sex: Treating RN: Jul 30, 1966 (55 y.o. [name] Primary Care Rosalynn Sergent: [name] Other Clinician: Referring Tyvion Edmondson: Treating Harlene Petralia/Extender: Carlus Pavlov in Treatment: 1 Vital Signs Time Taken: 08:47 Temperature (F): 97.8 Pulse (bpm): 103 Respiratory Rate (breaths/min): 18 Blood Pressure (mmHg): 121/83 Reference Range: 80 - 120 mg / dl Electronic Signature(s) Signed: 09/27/2021 3:40:48 PM By: Lorrin Jackson Entered By: Lorrin Jackson on 09/26/2021 08:48:22

## 2021-09-28 ENCOUNTER — Other Ambulatory Visit: Payer: Self-pay

## 2021-09-28 ENCOUNTER — Ambulatory Visit (HOSPITAL_COMMUNITY)
Admission: RE | Admit: 2021-09-28 | Discharge: 2021-09-28 | Disposition: A | Discharge status: Home / Self Care | Payer: Medicare Other | Source: Ambulatory Visit | Attending: Internal Medicine | Admitting: Internal Medicine

## 2021-09-28 DIAGNOSIS — N2581 Secondary hyperparathyroidism of renal origin: Secondary | ICD-10-CM | POA: Diagnosis not present

## 2021-09-28 DIAGNOSIS — I959 Hypotension, unspecified: Secondary | ICD-10-CM | POA: Diagnosis not present

## 2021-09-28 DIAGNOSIS — L89613 Pressure ulcer of right heel, stage 3: Secondary | ICD-10-CM | POA: Diagnosis not present

## 2021-09-28 DIAGNOSIS — B9562 Methicillin resistant Staphylococcus aureus infection as the cause of diseases classified elsewhere: Secondary | ICD-10-CM | POA: Diagnosis not present

## 2021-09-28 DIAGNOSIS — D631 Anemia in chronic kidney disease: Secondary | ICD-10-CM | POA: Diagnosis not present

## 2021-09-28 DIAGNOSIS — R627 Adult failure to thrive: Secondary | ICD-10-CM | POA: Diagnosis not present

## 2021-09-28 DIAGNOSIS — T8571XD Infection and inflammatory reaction due to peritoneal dialysis catheter, subsequent encounter: Secondary | ICD-10-CM | POA: Diagnosis not present

## 2021-09-28 DIAGNOSIS — Z23 Encounter for immunization: Secondary | ICD-10-CM | POA: Diagnosis not present

## 2021-09-28 DIAGNOSIS — N186 End stage renal disease: Secondary | ICD-10-CM | POA: Diagnosis not present

## 2021-09-28 DIAGNOSIS — K652 Spontaneous bacterial peritonitis: Secondary | ICD-10-CM | POA: Diagnosis not present

## 2021-09-28 NOTE — Progress Notes (Signed)
ABI has been completed.   Preliminary results in CV Proc.   Jane [name] 09/28/2021 9:44 AM

## 2021-09-29 DIAGNOSIS — D631 Anemia in chronic kidney disease: Secondary | ICD-10-CM | POA: Diagnosis not present

## 2021-09-29 DIAGNOSIS — N186 End stage renal disease: Secondary | ICD-10-CM | POA: Diagnosis not present

## 2021-09-29 DIAGNOSIS — N2581 Secondary hyperparathyroidism of renal origin: Secondary | ICD-10-CM | POA: Diagnosis not present

## 2021-09-29 DIAGNOSIS — Z23 Encounter for immunization: Secondary | ICD-10-CM | POA: Diagnosis not present

## 2021-09-30 DIAGNOSIS — N2581 Secondary hyperparathyroidism of renal origin: Secondary | ICD-10-CM | POA: Diagnosis not present

## 2021-09-30 DIAGNOSIS — L89613 Pressure ulcer of right heel, stage 3: Secondary | ICD-10-CM | POA: Diagnosis not present

## 2021-09-30 DIAGNOSIS — N186 End stage renal disease: Secondary | ICD-10-CM | POA: Diagnosis not present

## 2021-09-30 DIAGNOSIS — Z23 Encounter for immunization: Secondary | ICD-10-CM | POA: Diagnosis not present

## 2021-09-30 DIAGNOSIS — B9562 Methicillin resistant Staphylococcus aureus infection as the cause of diseases classified elsewhere: Secondary | ICD-10-CM | POA: Diagnosis not present

## 2021-09-30 DIAGNOSIS — T8571XD Infection and inflammatory reaction due to peritoneal dialysis catheter, subsequent encounter: Secondary | ICD-10-CM | POA: Diagnosis not present

## 2021-09-30 DIAGNOSIS — I959 Hypotension, unspecified: Secondary | ICD-10-CM | POA: Diagnosis not present

## 2021-09-30 DIAGNOSIS — K652 Spontaneous bacterial peritonitis: Secondary | ICD-10-CM | POA: Diagnosis not present

## 2021-09-30 DIAGNOSIS — R627 Adult failure to thrive: Secondary | ICD-10-CM | POA: Diagnosis not present

## 2021-09-30 DIAGNOSIS — D631 Anemia in chronic kidney disease: Secondary | ICD-10-CM | POA: Diagnosis not present

## 2021-10-01 DIAGNOSIS — D631 Anemia in chronic kidney disease: Secondary | ICD-10-CM | POA: Diagnosis not present

## 2021-10-01 DIAGNOSIS — Z23 Encounter for immunization: Secondary | ICD-10-CM | POA: Diagnosis not present

## 2021-10-01 DIAGNOSIS — N2581 Secondary hyperparathyroidism of renal origin: Secondary | ICD-10-CM | POA: Diagnosis not present

## 2021-10-01 DIAGNOSIS — N186 End stage renal disease: Secondary | ICD-10-CM | POA: Diagnosis not present

## 2021-10-02 DIAGNOSIS — Z23 Encounter for immunization: Secondary | ICD-10-CM | POA: Diagnosis not present

## 2021-10-02 DIAGNOSIS — N2581 Secondary hyperparathyroidism of renal origin: Secondary | ICD-10-CM | POA: Diagnosis not present

## 2021-10-02 DIAGNOSIS — D631 Anemia in chronic kidney disease: Secondary | ICD-10-CM | POA: Diagnosis not present

## 2021-10-02 DIAGNOSIS — N186 End stage renal disease: Secondary | ICD-10-CM | POA: Diagnosis not present

## 2021-10-03 ENCOUNTER — Other Ambulatory Visit (HOSPITAL_COMMUNITY): Payer: Self-pay | Admitting: Internal Medicine

## 2021-10-03 ENCOUNTER — Encounter (HOSPITAL_BASED_OUTPATIENT_CLINIC_OR_DEPARTMENT_OTHER): Payer: Medicare Other | Admitting: Internal Medicine

## 2021-10-03 DIAGNOSIS — L97512 Non-pressure chronic ulcer of other part of right foot with fat layer exposed: Secondary | ICD-10-CM | POA: Diagnosis not present

## 2021-10-03 DIAGNOSIS — L89613 Pressure ulcer of right heel, stage 3: Secondary | ICD-10-CM | POA: Diagnosis not present

## 2021-10-03 DIAGNOSIS — Z23 Encounter for immunization: Secondary | ICD-10-CM | POA: Diagnosis not present

## 2021-10-03 DIAGNOSIS — L97522 Non-pressure chronic ulcer of other part of left foot with fat layer exposed: Secondary | ICD-10-CM

## 2021-10-03 DIAGNOSIS — D631 Anemia in chronic kidney disease: Secondary | ICD-10-CM | POA: Diagnosis not present

## 2021-10-03 DIAGNOSIS — N2581 Secondary hyperparathyroidism of renal origin: Secondary | ICD-10-CM | POA: Diagnosis not present

## 2021-10-03 DIAGNOSIS — E11621 Type 2 diabetes mellitus with foot ulcer: Secondary | ICD-10-CM

## 2021-10-03 DIAGNOSIS — L932 Other local lupus erythematosus: Secondary | ICD-10-CM | POA: Diagnosis not present

## 2021-10-03 DIAGNOSIS — N186 End stage renal disease: Secondary | ICD-10-CM | POA: Diagnosis not present

## 2021-10-03 DIAGNOSIS — Z992 Dependence on renal dialysis: Secondary | ICD-10-CM | POA: Diagnosis not present

## 2021-10-03 NOTE — Progress Notes (Signed)
Jane [name], Jane [name] ([id number]) Visit Report for 10/03/2021 Arrival Information Details Patient Name: Date of Service: Jane [name], Jane [name]. 10/03/2021 9:15 A M Medical Record Number: [id number] Patient Account Number: [id number] Date of Birth/Sex: Treating RN: 1966-06-02 (55 y.o. Jane [name] Primary Care Jane [name]: Jane [name] Other Clinician: Referring Jane [name]: Treating Jane [name]/Extender: Jane [name] in Treatment: 2 Visit Information History Since Last Visit Added or deleted any medications: No Patient Arrived: Wheel Chair Any new allergies or adverse reactions: No Arrival Time: 09:08 Had a fall or experienced change in No Accompanied By: brother in law activities of daily living that may affect Transfer Assistance: None risk of falls: Patient Identification Verified: Yes Signs or symptoms of abuse/neglect since last visito No Secondary Verification Process Completed: Yes Hospitalized since last visit: No Patient Requires Transmission-Based Precautions: No Implantable device outside of the clinic excluding No Patient Has Alerts: Yes cellular tissue based products placed in the center Patient Alerts: R ABI=Non Comp since last visit: Has Dressing in Place as Prescribed: Yes Pain Present Now: Yes Electronic Signature(s) Signed: 10/03/2021 2:35:48 PM By: Jane [name] Entered By: Jane [name] on 10/03/2021 09:12:38 -------------------------------------------------------------------------------- Clinic Level of Care Assessment Details Patient Name: Date of Service: Jane [name], Jane [name]. 10/03/2021 9:15 A M Medical Record Number: [id number] Patient Account Number: [id number] Date of Birth/Sex: Treating RN: 02/03/1966 (55 y.o. Jane [name] Primary Care Jane [name]: Jane [name] Other Clinician: Referring Karli Wickizer: Treating Keiran Sias/Extender: Jane [name] in Treatment: 2 Clinic Level of Care  Assessment Items TOOL 4 Quantity Score []  - 0 Use when only an EandM is performed on FOLLOW-UP visit ASSESSMENTS - Nursing Assessment / Reassessment X- 1 10 Reassessment of Co-morbidities (includes updates in patient status) X- 1 5 Reassessment of Adherence to Treatment Plan ASSESSMENTS - Wound and Skin A ssessment / Reassessment []  - 0 Simple Wound Assessment / Reassessment - one wound X- 2 5 Complex Wound Assessment / Reassessment - multiple wounds []  - 0 Dermatologic / Skin Assessment (not related to wound area) ASSESSMENTS - Focused Assessment X- 1 5 Circumferential Edema Measurements - multi extremities []  - 0 Nutritional Assessment / Counseling / Intervention X- 1 5 Lower Extremity Assessment (monofilament, tuning fork, pulses) []  - 0 Peripheral Arterial Disease Assessment (using hand held doppler) ASSESSMENTS - Ostomy and/or Continence Assessment and Care []  - 0 Incontinence Assessment and Management []  - 0 Ostomy Care Assessment and Management (repouching, etc.) PROCESS - Coordination of Care X - Simple Patient / Family Education for ongoing care 1 15 []  - 0 Complex (extensive) Patient / Family Education for ongoing care X- 1 10 Staff obtains Programmer, systems, Records, T Results / Process Orders est []  - 0 Staff telephones HHA, Nursing Homes / Clarify orders / etc []  - 0 Routine Transfer to another Facility (non-emergent condition) []  - 0 Routine Hospital Admission (non-emergent condition) []  - 0 New Admissions / Biomedical engineer / Ordering NPWT Apligraf, etc. , []  - 0 Emergency Hospital Admission (emergent condition) X- 1 10 Simple Discharge Coordination []  - 0 Complex (extensive) Discharge Coordination PROCESS - Special Needs []  - 0 Pediatric / Minor Patient Management []  - 0 Isolation Patient Management []  - 0 Hearing / Language / Visual special needs []  - 0 Assessment of Community assistance (transportation, D/C planning, etc.) []  -  0 Additional assistance / Altered mentation []  - 0 Support Surface(s) Assessment (bed, cushion, seat, etc.) INTERVENTIONS - Wound Cleansing / Measurement []  - 0 Simple Wound Cleansing - one wound X-  2 5 Complex Wound Cleansing - multiple wounds X- 1 5 Wound Imaging (photographs - any number of wounds) []  - 0 Wound Tracing (instead of photographs) []  - 0 Simple Wound Measurement - one wound X- 2 5 Complex Wound Measurement - multiple wounds INTERVENTIONS - Wound Dressings X - Small Wound Dressing one or multiple wounds 1 10 []  - 0 Medium Wound Dressing one or multiple wounds []  - 0 Large Wound Dressing one or multiple wounds []  - 0 Application of Medications - topical []  - 0 Application of Medications - injection INTERVENTIONS - Miscellaneous []  - 0 External ear exam []  - 0 Specimen Collection (cultures, biopsies, blood, body fluids, etc.) []  - 0 Specimen(s) / Culture(s) sent or taken to Lab for analysis []  - 0 Patient Transfer (multiple staff / Civil Service fast streamer / Similar devices) []  - 0 Simple Staple / Suture removal (25 or less) []  - 0 Complex Staple / Suture removal (26 or more) []  - 0 Hypo / Hyperglycemic Management (close monitor of Blood Glucose) []  - 0 Ankle / Brachial Index (ABI) - do not check if billed separately X- 1 5 Vital Signs Has the patient been seen at the hospital within the last three years: Yes Total Score: 110 Level Of Care: New/Established - Level 3 Electronic Signature(s) Signed: 10/03/2021 4:53:33 PM By: Baruch Gouty RN, BSN Entered By: Baruch Gouty on 10/03/2021 09:47:14 -------------------------------------------------------------------------------- Encounter Discharge Information Details Patient Name: Date of Service: Jane Nurse M. 10/03/2021 9:15 A M Medical Record Number: [id number] Patient Account Number: [id number] Date of Birth/Sex: Treating RN: [birth year] (55 y.o. Jane [name] Primary Care Archit Leger: Jane [name] Other Clinician: Referring Carliyah Cotterman: Treating Bruce Churilla/Extender: Jane [name] in Treatment: 2 Encounter Discharge Information Items Discharge Condition: Stable Ambulatory Status: Wheelchair Discharge Destination: Home Transportation: Other Accompanied By: brother in law Schedule Follow-up Appointment: Yes Clinical Summary of Care: Patient Declined Notes transportation service Electronic Signature(s) Signed: 10/03/2021 4:53:33 PM By: Baruch Gouty RN, BSN Entered By: Baruch Gouty on 10/03/2021 10:03:19 -------------------------------------------------------------------------------- Lower Extremity Assessment Details Patient Name: Date of Service: DAMIYA, SANDEFUR. 10/03/2021 9:15 A M Medical Record Number: [id number] Patient Account Number: [id number] Date of Birth/Sex: Treating RN: [birth year] (55 y.o. Jane [name] Primary Care Lakeitha Basques: Jane [name] Other Clinician: Referring Zalan Shidler: Treating Callaway Hailes/Extender: Jane [name] in Treatment: 2 Edema Assessment Assessed: [Left: No] [Right: No] Edema: [Left: Ye] [Right: s] Calf Left: Right: Point of Measurement: 30 cm From Medial Instep 37 cm Ankle Left: Right: Point of Measurement: 7 cm From Medial Instep 25 cm Vascular Assessment Pulses: Dorsalis Pedis Palpable: [Right:Yes] Electronic Signature(s) Signed: 10/03/2021 4:53:33 PM By: Baruch Gouty RN, BSN Entered By: Baruch Gouty on 10/03/2021 09:18:27 -------------------------------------------------------------------------------- Multi Wound Chart Details Patient Name: Date of Service: Jane Nurse M. 10/03/2021 9:15 A M Medical Record Number: [id number] Patient Account Number: [id number] Date of Birth/Sex: Treating RN: 1966-05-08 (55 y.o. Jane [name] Primary Care Jailine Lieder: Jane [name] Other Clinician: Referring Zayla Agar: Treating Daud Cayer/Extender: Jane [name] in Treatment: 2 Vital Signs Height(in): Pulse(bpm): 120 Weight(lbs): Blood Pressure(mmHg): 119/84 Body Mass Index(BMI): Temperature(F): 98.1 Respiratory Rate(breaths/min): 18 Photos: [1:No Photos Right, Medial Calcaneus] [2:No Photos Right Calcaneus] [N/A:N/A N/A] Wound Location: [1:Pressure Injury] [2:Pressure Injury] [N/A:N/A] Wounding Event: [1:Pressure Ulcer] [2:Pressure Ulcer] [N/A:N/A] Primary Etiology: [1:Cataracts, Anemia, Congestive Heart Cataracts, Anemia, Congestive Heart N/A] Comorbid History: [1:Failure, Hypotension, End Stage Renal Failure, Hypotension, End Stage Renal Disease, Seizure Disorder 06/22/2021] [2:Disease, Seizure Disorder 10/03/2021] [N/A:N/A]  Date Acquired: [1:2] [2:0] [N/A:N/A] Weeks of Treatment: [1:Open] [2:Open] [N/A:N/A] Wound Status: [1:0.5x0.5x0.1] [2:2.8x2.4x0.1] [N/A:N/A] Measurements L x W x D (cm) [1:0.196] [2:5.278] [N/A:N/A] A (cm) : rea [1:0.02] [2:0.528] [N/A:N/A] Volume (cm) : [1:75.00%] [2:N/A] [N/A:N/A] % Reduction in A rea: [1:91.50%] [2:N/A] [N/A:N/A] % Reduction in Volume: [1:Category/Stage II] [2:Unstageable/Unclassified] [N/A:N/A] Classification: [1:Medium] [2:Medium] [N/A:N/A] Exudate A mount: [1:Serosanguineous] [2:Serosanguineous] [N/A:N/A] Exudate Type: [1:red, brown] [2:red, brown] [N/A:N/A] Exudate Color: [1:Distinct, outline attached] [2:Flat and Intact] [N/A:N/A] Wound Margin: [1:Large (67-100%)] [2:None Present (0%)] [N/A:N/A] Granulation A mount: [1:Red, Pink] [2:N/A] [N/A:N/A] Granulation Quality: [1:Small (1-33%)] [2:Large (67-100%)] [N/A:N/A] Necrotic A mount: [1:Adherent Slough] [2:Eschar] [N/A:N/A] Necrotic Tissue: [1:Fat Layer (Subcutaneous Tissue): Yes Fat Layer (Subcutaneous Tissue): Yes N/A] Exposed Structures: [1:Fascia: No Tendon: No Muscle: No Joint: No Bone: No Small (1-33%)] [2:Fascia: No Tendon: No Muscle: No Joint: No Bone: No Small (1-33%)] [N/A:N/A] Treatment  Notes Wound #1 (Calcaneus) Wound Laterality: Right, Medial Cleanser Soap and Water Discharge Instruction: May shower and wash wound with dial antibacterial soap and water prior to dressing change. Peri-Wound Care Skin Prep Discharge Instruction: Use skin prep as directed Topical Primary Dressing Hydrofera Blue Ready Foam, 2.5 x2.5 in Discharge Instruction: Apply to wound bed as instructed Secondary Dressing Woven Gauze Sponge, Non-Sterile 4x4 in Discharge Instruction: Apply over primary dressing as directed. ALLEVYN Heel 4 1/2in x 5 1/2in / 10.5cm x 13.5cm Discharge Instruction: Apply over primary dressing as directed. Secured With [employer], 4.5x3.1 (in/yd) Discharge Instruction: Secure with Kerlix as directed. Paper Tape, 2x10 (in/yd) Discharge Instruction: Secure dressing with tape as directed. Compression Wrap Compression Stockings Add-Ons Wound #2 (Calcaneus) Wound Laterality: Right Cleanser Soap and Water Discharge Instruction: May shower and wash wound with dial antibacterial soap and water prior to dressing change. Peri-Wound Care Skin Prep Discharge Instruction: Use skin prep as directed Topical Primary Dressing Hydrofera Blue Ready Foam, 2.5 x2.5 in Discharge Instruction: Apply to wound bed as instructed Secondary Dressing Woven Gauze Sponge, Non-Sterile 4x4 in Discharge Instruction: Apply over primary dressing as directed. ALLEVYN Heel 4 1/2in x 5 1/2in / 10.5cm x 13.5cm Discharge Instruction: Apply over primary dressing as directed. Secured With [employer], 4.5x3.1 (in/yd) Discharge Instruction: Secure with Kerlix as directed. Paper Tape, 2x10 (in/yd) Discharge Instruction: Secure dressing with tape as directed. Compression Wrap Compression Stockings Add-Ons Electronic Signature(s) Signed: 10/03/2021 12:33:24 PM By: [name] DO Signed: 10/03/2021 5:29:47 PM By: Fara Chute By: [name] on 10/03/2021  12:24:58 -------------------------------------------------------------------------------- [place] Details Patient Name: Date of Service: Jane Nurse M. 10/03/2021 9:15 A M Medical Record Number: [id number] Patient Account Number: [id number] Date of Birth/Sex: Treating RN: 1966-06-14 (55 y.o. Jane [name] Primary Care Kollins Fenter: Jane [name] Other Clinician: Referring Ayslin Kundert: Treating Ardis Fullwood/Extender: Jane [name] in Treatment: 2 Active Inactive Pressure Nursing Diagnoses: Knowledge deficit related to causes and risk factors for pressure ulcer development Goals: Patient will remain free from development of additional pressure ulcers Date Initiated: 09/19/2021 Target Resolution Date: 10/17/2021 Goal Status: Active Interventions: Assess: immobility, friction, shearing, incontinence upon admission and as needed Assess offloading mechanisms upon admission and as needed Treatment Activities: Pressure reduction/relief device ordered : 09/19/2021 T ordered outside of clinic : 09/19/2021 est Notes: Wound/Skin Impairment Nursing Diagnoses: Impaired tissue integrity Goals: Patient/caregiver will verbalize understanding of skin care regimen Date Initiated: 09/19/2021 Target Resolution Date: 10/17/2021 Goal Status: Active Ulcer/skin breakdown will have a volume reduction of 30% by week 4 Date Initiated: 09/19/2021 Target Resolution Date: 10/17/2021 Goal  Status: Active Interventions: Assess patient/caregiver ability to obtain necessary supplies Assess patient/caregiver ability to perform ulcer/skin care regimen upon admission and as needed Assess ulceration(s) every visit Provide education on ulcer and skin care Treatment Activities: Topical wound management initiated : 09/19/2021 Notes: Electronic Signature(s) Signed: 10/03/2021 4:53:33 PM By: Baruch Gouty RN, BSN Entered By: Baruch Gouty on  10/03/2021 09:25:48 -------------------------------------------------------------------------------- Pain Assessment Details Patient Name: Date of Service: Jane Nurse M. 10/03/2021 9:15 A M Medical Record Number: [id number] Patient Account Number: [id number] Date of Birth/Sex: Treating RN: 15-Jun-1966 (55 y.o. Jane [name] Primary Care Avalie Oconnor: Jane [name] Other Clinician: Referring Tannie Koskela: Treating Tallula Grindle/Extender: Jane [name] in Treatment: 2 Active Problems Location of Pain Severity and Description of Pain Patient Has Paino Yes Site Locations Rate the pain. Current Pain Level: 4 Pain Management and Medication Current Pain Management: Medication: Yes How does your wound impact your activities of daily livingo Sleep: No Bathing: No Appetite: No Relationship With Others: No Bladder Continence: No Emotions: No Bowel Continence: No Hobbies: No Toileting: No Dressing: No Notes pt hollers when attempt to touch or move her leg Electronic Signature(s) Signed: 10/03/2021 4:53:33 PM By: Baruch Gouty RN, BSN Signed: 10/03/2021 5:29:47 PM By: Lorrin Jackson Entered By: Baruch Gouty on 10/03/2021 09:24:56 -------------------------------------------------------------------------------- Patient/Caregiver Education Details Patient Name: Date of Service: Jane Dry. 12/12/2022andnbsp9:15 [place] Record Number: [id number] Patient Account Number: [id number] Date of Birth/Gender: Treating RN: 1966/07/13 (55 y.o. Jane [name] Primary Care Physician: Jane [name] Other Clinician: Referring Physician: Treating Physician/Extender: Jane [name] in Treatment: 2 Education Assessment Education Provided To: Patient Education Topics Provided Pressure: Methods: Explain/Verbal Responses: Reinforcements needed, State content correctly Wound/Skin Impairment: Methods:  Explain/Verbal Responses: Reinforcements needed, State content correctly Electronic Signature(s) Signed: 10/03/2021 4:53:33 PM By: Baruch Gouty RN, BSN Entered By: Baruch Gouty on 10/03/2021 09:26:12 -------------------------------------------------------------------------------- Wound Assessment Details Patient Name: Date of Service: Jane Nurse M. 10/03/2021 9:15 A M Medical Record Number: [id number] Patient Account Number: [id number] Date of Birth/Sex: Treating RN: 07-Oct-1966 (55 y.o. Jane [name] Primary Care Sharnette Kitamura: Jane [name] Other Clinician: Referring Phoenyx Paulsen: Treating Olympia Adelsberger/Extender: Jane [name] in Treatment: 2 Wound Status Wound Number: 1 Primary Pressure Ulcer Etiology: Wound Location: Right, Medial Calcaneus Wound Open Wounding Event: Pressure Injury Status: Date Acquired: 06/22/2021 Comorbid Cataracts, Anemia, Congestive Heart Failure, Hypotension, End Weeks Of Treatment: 2 History: Stage Renal Disease, Seizure Disorder Clustered Wound: No Wound Measurements Length: (cm) 0.5 Width: (cm) 0.5 Depth: (cm) 0.1 Area: (cm) 0.196 Volume: (cm) 0.02 % Reduction in Area: 75% % Reduction in Volume: 91.5% Epithelialization: Small (1-33%) Tunneling: No Undermining: No Wound Description Classification: Category/Stage II Wound Margin: Distinct, outline attached Exudate Amount: Medium Exudate Type: Serosanguineous Exudate Color: red, brown Foul Odor After Cleansing: No Slough/Fibrino Yes Wound Bed Granulation Amount: Large (67-100%) Exposed Structure Granulation Quality: Red, Pink Fascia Exposed: No Necrotic Amount: Small (1-33%) Fat Layer (Subcutaneous Tissue) Exposed: Yes Necrotic Quality: Adherent Slough Tendon Exposed: No Muscle Exposed: No Joint Exposed: No Bone Exposed: No Treatment Notes Wound #1 (Calcaneus) Wound Laterality: Right, Medial Cleanser Soap and Water Discharge Instruction:  May shower and wash wound with dial antibacterial soap and water prior to dressing change. Peri-Wound Care Skin Prep Discharge Instruction: Use skin prep as directed Topical Primary Dressing Hydrofera Blue Ready Foam, 2.5 x2.5 in Discharge Instruction: Apply to wound bed as instructed Secondary Dressing Woven Gauze Sponge, Non-Sterile 4x4 in Discharge Instruction: Apply over primary dressing as  directed. ALLEVYN Heel 4 1/2in x 5 1/2in / 10.5cm x 13.5cm Discharge Instruction: Apply over primary dressing as directed. Secured With [employer], 4.5x3.1 (in/yd) Discharge Instruction: Secure with Kerlix as directed. Paper Tape, 2x10 (in/yd) Discharge Instruction: Secure dressing with tape as directed. Compression Wrap Compression Stockings Add-Ons Electronic Signature(s) Signed: 10/03/2021 4:53:33 PM By: Baruch Gouty RN, BSN Signed: 10/03/2021 5:29:47 PM By: Lorrin Jackson Entered By: Baruch Gouty on 10/03/2021 09:23:03 -------------------------------------------------------------------------------- Wound Assessment Details Patient Name: Date of Service: Jane Nurse M. 10/03/2021 9:15 A M Medical Record Number: [id number] Patient Account Number: [id number] Date of Birth/Sex: Treating RN: 05/22/1966 (55 y.o. Jane [name] Primary Care Sylvi Rybolt: Jane [name] Other Clinician: Referring Thereasa Iannello: Treating Janaiah Vetrano/Extender: Jane [name] in Treatment: 2 Wound Status Wound Number: 2 Primary Pressure Ulcer Etiology: Wound Location: Right Calcaneus Wound Open Wounding Event: Pressure Injury Status: Date Acquired: 10/03/2021 Comorbid Cataracts, Anemia, Congestive Heart Failure, Hypotension, End Weeks Of Treatment: 0 History: Stage Renal Disease, Seizure Disorder Clustered Wound: No Photos Wound Measurements Length: (cm) 2.8 Width: (cm) 2.4 Depth: (cm) 0.1 Area: (cm) 5.278 Volume: (cm) 0.528 % Reduction in Area:  0% % Reduction in Volume: 0% Epithelialization: Small (1-33%) Tunneling: No Undermining: No Wound Description Classification: Unstageable/Unclassified Wound Margin: Flat and Intact Exudate Amount: Medium Exudate Type: Serosanguineous Exudate Color: red, brown Foul Odor After Cleansing: No Slough/Fibrino No Wound Bed Granulation Amount: None Present (0%) Exposed Structure Necrotic Amount: Large (67-100%) Fascia Exposed: No Necrotic Quality: Eschar Fat Layer (Subcutaneous Tissue) Exposed: Yes Tendon Exposed: No Muscle Exposed: No Joint Exposed: No Bone Exposed: No Treatment Notes Wound #2 (Calcaneus) Wound Laterality: Right Cleanser Soap and Water Discharge Instruction: May shower and wash wound with dial antibacterial soap and water prior to dressing change. Peri-Wound Care Skin Prep Discharge Instruction: Use skin prep as directed Topical Primary Dressing Hydrofera Blue Ready Foam, 2.5 x2.5 in Discharge Instruction: Apply to wound bed as instructed Secondary Dressing Woven Gauze Sponge, Non-Sterile 4x4 in Discharge Instruction: Apply over primary dressing as directed. ALLEVYN Heel 4 1/2in x 5 1/2in / 10.5cm x 13.5cm Discharge Instruction: Apply over primary dressing as directed. Secured With [employer], 4.5x3.1 (in/yd) Discharge Instruction: Secure with Kerlix as directed. Paper Tape, 2x10 (in/yd) Discharge Instruction: Secure dressing with tape as directed. Compression Wrap Compression Stockings Add-Ons Electronic Signature(s) Signed: 10/03/2021 3:51:47 PM By: Valeria Batman EMT Signed: 10/03/2021 4:53:33 PM By: Baruch Gouty RN, BSN Entered By: Valeria Batman on 10/03/2021 15:51:46 -------------------------------------------------------------------------------- Vitals Details Patient Name: Date of Service: Jane Nurse M. 10/03/2021 9:15 A M Medical Record Number: [id number] Patient Account Number: [id number] Date of Birth/Sex: Treating  RN: [birth year] (55 y.o. Jane [name] Primary Care Bryah Ocheltree: Jane [name] Other Clinician: Referring Elzora Cullins: Treating Bee Marchiano/Extender: Jane [name] in Treatment: 2 Vital Signs Time Taken: 09:08 Temperature (F): 98.1 Pulse (bpm): 120 Respiratory Rate (breaths/min): 18 Blood Pressure (mmHg): 119/84 Reference Range: 80 - 120 mg / dl Electronic Signature(s) Signed: 10/03/2021 2:35:48 PM By: Jane [name] Entered By: Jane [name] on 10/03/2021 09:09:00

## 2021-10-03 NOTE — Progress Notes (Signed)
NYJAE, HODGE ([id number]) Visit Report for 10/03/2021 Chief Complaint Document Details Patient Name: Date of Service: Jane [name], Jane [name]. 10/03/2021 9:15 A M Medical Record Number: [id number] Patient Account Number: [id number] Date of Birth/Sex: Treating RN: 12-25-1965 (55 y.o. [name] Primary Care Provider: [name] Other Clinician: Referring Provider: Treating Provider/Extender: Carlus Pavlov in Treatment: 2 Information Obtained from: Patient Chief Complaint Right heel wound Electronic Signature(s) Signed: 10/03/2021 12:33:24 PM By: [name] DO Entered By: [name] on 10/03/2021 12:25:06 -------------------------------------------------------------------------------- HPI Details Patient Name: Date of Service: [name] 10/03/2021 9:15 A M Medical Record Number: [id number] Patient Account Number: [id number] Date of Birth/Sex: Treating RN: 1966/03/26 (55 y.o. [name] Primary Care Provider: [name] Other Clinician: Referring Provider: Treating Provider/Extender: Carlus Pavlov in Treatment: 2 History of Present Illness HPI Description: Admission 09/19/2021 Ms. Jane [name] is a 55 year old female with a past medical history of schizoaffective disorder, end-stage renal disease on peritoneal dialysis and seizure disorder that presents to the clinic for a right heel wound that has been present since her hospitalization in April 2022 because of an epidural abscess that required surgery. During her hospitalization she developed the heel wound and a breast wound. She would lean more aggressively to the left side for long periods of time. The breast wound has healed. The right heel Has developed a loose callus. She is not mobile and is either in her wheelchair or in the bed most of the day. She currently denies pain, increased warmth or erythema to the area  or drainage. Son-in-law is present and helps with the history. Patient is unable to participate fully in her care due to her diagnosis of schizoaffective disorder. 10/03/2021; patient presents for follow-up. She has developed a new pressure injury to the calcaneus and has skin breakdown to the surrounding area. She has been using Hydrofera Blue to the previous wound site. She has no issues or complaints today. She denies signs of infection. Electronic Signature(s) Signed: 10/03/2021 12:33:24 PM By: [name] DO Entered By: [name] on 10/03/2021 12:27:02 -------------------------------------------------------------------------------- Physical Exam Details Patient Name: Date of Service: [name] 10/03/2021 9:15 A M Medical Record Number: [id number] Patient Account Number: [id number] Date of Birth/Sex: Treating RN: Apr 23, 1966 (55 y.o. [name] Primary Care Provider: [name] Other Clinician: Referring Provider: Treating Provider/Extender: Carlus Pavlov in Treatment: 2 Constitutional respirations regular, non-labored and within target range for patient.. Cardiovascular 2+ dorsalis pedis/posterior tibialis pulses. Psychiatric pleasant and cooperative. Notes Right heel: T the medial aspect there is an open wound with granulation tissue and scant nonviable tissue. Directly to the posterior aspect there is a pressure o injury with surrounding skin breakdown. No obvious signs of infection. Electronic Signature(s) Signed: 10/03/2021 12:33:24 PM By: [name] DO Entered By: [name] on 10/03/2021 12:28:26 -------------------------------------------------------------------------------- Physician Orders Details Patient Name: Date of Service: [name] 10/03/2021 9:15 A M Medical Record Number: [id number] Patient Account Number: [id number] Date of Birth/Sex: Treating RN: 26-Jun-1966 (55 y.o. [name] Primary Care Provider: [name] Other Clinician: Referring Provider: Treating Provider/Extender: Carlus Pavlov in Treatment: 2 Verbal / Phone Orders: No Diagnosis Coding ICD-10 Coding Code Description [phone] Pressure ulcer of right heel, stage 3 F25.9 Schizoaffective disorder, unspecified N18.6 End stage renal disease Follow-up Appointments ppointment in 1 week. - with Dr. Heber [place] Return A Bathing/ Shower/ Hygiene May shower and wash wound  with soap and water. - when changing dressing Edema Control - Lymphedema / SCD / Other Elevate legs to the level of the heart or above for 30 minutes daily and/or when sitting, a frequency of: Off-Loading Other: - Prevalon Boot to right foot Additional Orders / Instructions Follow Nutritious Diet Wound Treatment Wound #1 - Calcaneus Wound Laterality: Right, Medial Cleanser: Soap and Water 1 x Per Day/30 Days Discharge Instructions: May shower and wash wound with dial antibacterial soap and water prior to dressing change. Peri-Wound Care: Skin Prep 1 x Per Day/30 Days Discharge Instructions: Use skin prep as directed Prim Dressing: Hydrofera Blue Ready Foam, 2.5 x2.5 in ary 1 x Per Day/30 Days Discharge Instructions: Apply to wound bed as instructed Secondary Dressing: Woven Gauze Sponge, Non-Sterile 4x4 in 1 x Per Day/30 Days Discharge Instructions: Apply over primary dressing as directed. Secondary Dressing: ALLEVYN Heel 4 1/2in x 5 1/2in / 10.5cm x 13.5cm 1 x Per Day/30 Days Discharge Instructions: Apply over primary dressing as directed. Secured With: [employer], 4.5x3.1 (in/yd) 1 x Per Day/30 Days Discharge Instructions: Secure with Kerlix as directed. Secured With: Paper Tape, 2x10 (in/yd) 1 x Per Day/30 Days Discharge Instructions: Secure dressing with tape as directed. Wound #2 - Calcaneus Wound Laterality: Right Cleanser: Soap and Water 1 x Per Day/30 Days Discharge  Instructions: May shower and wash wound with dial antibacterial soap and water prior to dressing change. Peri-Wound Care: Skin Prep 1 x Per Day/30 Days Discharge Instructions: Use skin prep as directed Prim Dressing: Hydrofera Blue Ready Foam, 2.5 x2.5 in 1 x Per Day/30 Days ary Discharge Instructions: Apply to wound bed as instructed Secondary Dressing: Woven Gauze Sponge, Non-Sterile 4x4 in 1 x Per Day/30 Days Discharge Instructions: Apply over primary dressing as directed. Secondary Dressing: ALLEVYN Heel 4 1/2in x 5 1/2in / 10.5cm x 13.5cm 1 x Per Day/30 Days Discharge Instructions: Apply over primary dressing as directed. Secured With: [employer], 4.5x3.1 (in/yd) 1 x Per Day/30 Days Discharge Instructions: Secure with Kerlix as directed. Secured With: Paper Tape, 2x10 (in/yd) 1 x Per Day/30 Days Discharge Instructions: Secure dressing with tape as directed. Electronic Signature(s) Signed: 10/03/2021 12:33:24 PM By: [name] DO Entered By: [name] on 10/03/2021 12:30:11 -------------------------------------------------------------------------------- Problem List Details Patient Name: Date of Service: [name] 10/03/2021 9:15 A M Medical Record Number: [id number] Patient Account Number: [id number] Date of Birth/Sex: Treating RN: Sep 17, 1966 (55 y.o. [name] Primary Care Provider: [name] Other Clinician: Referring Provider: Treating Provider/Extender: Carlus Pavlov in Treatment: 2 Active Problems ICD-10 Encounter Code Description Active Date MDM Diagnosis L89.613 Pressure ulcer of right heel, stage 3 09/19/2021 No Yes F25.9 Schizoaffective disorder, unspecified 09/19/2021 No Yes N18.6 End stage renal disease 09/19/2021 No Yes L89.610 Pressure ulcer of right heel, unstageable 10/03/2021 No Yes Inactive Problems Resolved Problems Electronic Signature(s) Signed: 10/03/2021 12:33:24 PM By:  [name] DO Entered By: [name] on 10/03/2021 12:29:53 -------------------------------------------------------------------------------- Progress Note Details Patient Name: Date of Service: [name] 10/03/2021 9:15 A M Medical Record Number: [id number] Patient Account Number: [id number] Date of Birth/Sex: Treating RN: 1966/06/01 (55 y.o. [name] Primary Care Provider: [name] Other Clinician: Referring Provider: Treating Provider/Extender: Carlus Pavlov in Treatment: 2 Subjective Chief Complaint Information obtained from Patient Right heel wound History of Present Illness (HPI) Admission 09/19/2021 Ms. Denissa Cozart is a 55 year old female with a past medical history of schizoaffective disorder, end-stage renal  disease on peritoneal dialysis and seizure disorder that presents to the clinic for a right heel wound that has been present since her hospitalization in April 2022 because of an epidural abscess that required surgery. During her hospitalization she developed the heel wound and a breast wound. She would lean more aggressively to the left side for long periods of time. The breast wound has healed. The right heel Has developed a loose callus. She is not mobile and is either in her wheelchair or in the bed most of the day. She currently denies pain, increased warmth or erythema to the area or drainage. Son-in-law is present and helps with the history. Patient is unable to participate fully in her care due to her diagnosis of schizoaffective disorder. 10/03/2021; patient presents for follow-up. She has developed a new pressure injury to the calcaneus and has skin breakdown to the surrounding area. She has been using Hydrofera Blue to the previous wound site. She has no issues or complaints today. She denies signs of infection. Patient History Information obtained from Patient, Caregiver. Family  History Cancer - Mother, Kidney Disease - Maternal Grandparents, No family history of Diabetes, Heart Disease, Hereditary Spherocytosis, Hypertension, Lung Disease, Seizures, Stroke, Thyroid Problems, Tuberculosis. Social History Never smoker, Marital Status - Single, Alcohol Use - Never, Drug Use - No History, Caffeine Use - Rarely. Medical History Eyes Patient has history of Cataracts Hematologic/Lymphatic Patient has history of Anemia Cardiovascular Patient has history of Congestive Heart Failure, Hypotension Denies history of Hypertension Genitourinary Patient has history of End Stage Renal Disease - Dialysis Neurologic Patient has history of Seizure Disorder - last seizure several years ago Medical A Surgical History Notes nd Endocrine Hypothyroidism Musculoskeletal Laminectomy 05/2021 Psychiatric Schizoaffective Disorder Objective Constitutional respirations regular, non-labored and within target range for patient.. Vitals Time Taken: 9:08 AM, Temperature: 98.1 F, Pulse: 120 bpm, Respiratory Rate: 18 breaths/min, Blood Pressure: 119/84 mmHg. Cardiovascular 2+ dorsalis pedis/posterior tibialis pulses. Psychiatric pleasant and cooperative. General Notes: Right heel: T the medial aspect there is an open wound with granulation tissue and scant nonviable tissue. Directly to the posterior aspect o there is a pressure injury with surrounding skin breakdown. No obvious signs of infection. Integumentary (Hair, Skin) Wound #1 status is Open. Original cause of wound was Pressure Injury. The date acquired was: 06/22/2021. The wound has been in treatment 2 weeks. The wound is located on the Right,Medial Calcaneus. The wound measures 0.5cm length x 0.5cm width x 0.1cm depth; 0.196cm^2 area and 0.02cm^3 volume. There is Fat Layer (Subcutaneous Tissue) exposed. There is no tunneling or undermining noted. There is a medium amount of serosanguineous drainage noted. The wound margin is  distinct with the outline attached to the wound base. There is large (67-100%) red, pink granulation within the wound bed. There is a small (1- 33%) amount of necrotic tissue within the wound bed including Adherent Slough. Wound #2 status is Open. Original cause of wound was Pressure Injury. The date acquired was: 10/03/2021. The wound is located on the Right Calcaneus. The wound measures 2.8cm length x 2.4cm width x 0.1cm depth; 5.278cm^2 area and 0.528cm^3 volume. There is Fat Layer (Subcutaneous Tissue) exposed. There is no tunneling or undermining noted. There is a medium amount of serosanguineous drainage noted. The wound margin is flat and intact. There is no granulation within the wound bed. There is a large (67-100%) amount of necrotic tissue within the wound bed including Eschar. Assessment Active Problems ICD-10 Pressure ulcer of right heel, stage 3 Schizoaffective disorder,  unspecified End stage renal disease Pressure ulcer of right heel, unstageable Patient's wound has shown improvement in size and appearance since last clinic visit. No signs of infection. I recommended continuing Hydrofera Blue. Unfortunately she has developed a pressure injury to her heel and some area of skin breakdown. I recommend putting Hydrofera Blue to this area. I highly recommended aggressive offloading to the heel. We ordered Prevalon boots at last clinic visit. She obtained her x-ray and it did not show any acute osseous abnormality. She also obtained her ABIs and they were noncompressible however TBI's were not obtained despite being ordered. We will reach out to the vascular lab to see what the issue was. She will need to follow these up. This was communicated with the patient. Plan Follow-up Appointments: Return Appointment in 1 week. - with Dr. Heber  Bathing/ Shower/ Hygiene: May shower and wash wound with soap and water. - when changing dressing Edema Control - Lymphedema / SCD / Other: Elevate  legs to the level of the heart or above for 30 minutes daily and/or when sitting, a frequency of: Off-Loading: Other: - Prevalon Boot to right foot Additional Orders / Instructions: Follow Nutritious Diet WOUND #1: - Calcaneus Wound Laterality: Right, Medial Cleanser: Soap and Water 1 x Per Day/30 Days Discharge Instructions: May shower and wash wound with dial antibacterial soap and water prior to dressing change. Peri-Wound Care: Skin Prep 1 x Per Day/30 Days Discharge Instructions: Use skin prep as directed Prim Dressing: Hydrofera Blue Ready Foam, 2.5 x2.5 in 1 x Per Day/30 Days ary Discharge Instructions: Apply to wound bed as instructed Secondary Dressing: Woven Gauze Sponge, Non-Sterile 4x4 in 1 x Per Day/30 Days Discharge Instructions: Apply over primary dressing as directed. Secondary Dressing: ALLEVYN Heel 4 1/2in x 5 1/2in / 10.5cm x 13.5cm 1 x Per Day/30 Days Discharge Instructions: Apply over primary dressing as directed. Secured With: [employer], 4.5x3.1 (in/yd) 1 x Per Day/30 Days Discharge Instructions: Secure with Kerlix as directed. Secured With: Paper T ape, 2x10 (in/yd) 1 x Per Day/30 Days Discharge Instructions: Secure dressing with tape as directed. WOUND #2: - Calcaneus Wound Laterality: Right Cleanser: Soap and Water 1 x Per Day/30 Days Discharge Instructions: May shower and wash wound with dial antibacterial soap and water prior to dressing change. Peri-Wound Care: Skin Prep 1 x Per Day/30 Days Discharge Instructions: Use skin prep as directed Prim Dressing: Hydrofera Blue Ready Foam, 2.5 x2.5 in 1 x Per Day/30 Days ary Discharge Instructions: Apply to wound bed as instructed Secondary Dressing: Woven Gauze Sponge, Non-Sterile 4x4 in 1 x Per Day/30 Days Discharge Instructions: Apply over primary dressing as directed. Secondary Dressing: ALLEVYN Heel 4 1/2in x 5 1/2in / 10.5cm x 13.5cm 1 x Per Day/30 Days Discharge Instructions: Apply over primary  dressing as directed. Secured With: [employer], 4.5x3.1 (in/yd) 1 x Per Day/30 Days Discharge Instructions: Secure with Kerlix as directed. Secured With: Paper T ape, 2x10 (in/yd) 1 x Per Day/30 Days Discharge Instructions: Secure dressing with tape as directed. 1. Hydrofera Blue 2. Aggressive offloading 3. Follow-up in 1 week Electronic Signature(s) Signed: 10/03/2021 12:33:24 PM By: [name] DO Entered By: [name] on 10/03/2021 12:32:46 -------------------------------------------------------------------------------- HxROS Details Patient Name: Date of Service: [name] 10/03/2021 9:15 A M Medical Record Number: [id number] Patient Account Number: [id number] Date of Birth/Sex: Treating RN: 1966-07-30 (55 y.o. [name] Primary Care Provider: [name] Other Clinician: Referring Provider: Treating Provider/Extender: Carlus Pavlov  in Treatment: 2 Information Obtained From Patient Caregiver Eyes Medical History: Positive for: Cataracts Hematologic/Lymphatic Medical History: Positive for: Anemia Cardiovascular Medical History: Positive for: Congestive Heart Failure; Hypotension Negative for: Hypertension Endocrine Medical History: Past Medical History Notes: Hypothyroidism Genitourinary Medical History: Positive for: End Stage Renal Disease - Dialysis Musculoskeletal Medical History: Past Medical History Notes: Laminectomy 05/2021 Neurologic Medical History: Positive for: Seizure Disorder - last seizure several years ago Psychiatric Medical History: Past Medical History Notes: Schizoaffective Disorder HBO Extended History Items Eyes: Cataracts Immunizations Pneumococcal Vaccine: Received Pneumococcal Vaccination: No Implantable Devices Yes Family and Social History Cancer: Yes - Mother; Diabetes: No; Heart Disease: No; Hereditary Spherocytosis: No; Hypertension: No; Kidney  Disease: Yes - Maternal Grandparents; Lung Disease: No; Seizures: No; Stroke: No; Thyroid Problems: No; Tuberculosis: No; Never smoker; Marital Status - Single; Alcohol Use: Never; Drug Use: No History; Caffeine Use: Rarely; Financial Concerns: No; Food, Clothing or Shelter Needs: No; Support System Lacking: No; Transportation Concerns: No Electronic Signature(s) Signed: 10/03/2021 12:33:24 PM By: [name] DO Signed: 10/03/2021 5:29:47 PM By: Lorrin Jackson Entered By: [name] on 10/03/2021 12:27:07 -------------------------------------------------------------------------------- [place] Details Patient Name: Date of Service: [name] 10/03/2021 Medical Record Number: [id number] Patient Account Number: [id number] Date of Birth/Sex: Treating RN: 03-23-1966 (55 y.o. [name] Primary Care Provider: [name] Other Clinician: Referring Provider: Treating Provider/Extender: Carlus Pavlov in Treatment: 2 Diagnosis Coding ICD-10 Codes Code Description [phone] Pressure ulcer of right heel, stage 3 F25.9 Schizoaffective disorder, unspecified N18.6 End stage renal disease L89.610 Pressure ulcer of right heel, unstageable Facility Procedures CPT4 Code: [id number] Description: [id number] - WOUND CARE VISIT-LEV 3 EST PT Modifier: Quantity: 1 Physician Procedures : CPT4 Code Description Modifier [id number] - WC PHYS LEVEL 3 - EST PT ICD-10 Diagnosis Description L89.613 Pressure ulcer of right heel, stage 3 L89.610 Pressure ulcer of right heel, unstageable F25.9 Schizoaffective disorder, unspecified N18.6 End  stage renal disease Quantity: 1 Electronic Signature(s) Signed: 10/03/2021 12:33:24 PM By: [name] DO Entered By: [name] on 10/03/2021 12:33:04

## 2021-10-04 DIAGNOSIS — Z23 Encounter for immunization: Secondary | ICD-10-CM | POA: Diagnosis not present

## 2021-10-04 DIAGNOSIS — D631 Anemia in chronic kidney disease: Secondary | ICD-10-CM | POA: Diagnosis not present

## 2021-10-04 DIAGNOSIS — N2581 Secondary hyperparathyroidism of renal origin: Secondary | ICD-10-CM | POA: Diagnosis not present

## 2021-10-04 DIAGNOSIS — N186 End stage renal disease: Secondary | ICD-10-CM | POA: Diagnosis not present

## 2021-10-05 DIAGNOSIS — K652 Spontaneous bacterial peritonitis: Secondary | ICD-10-CM | POA: Diagnosis not present

## 2021-10-05 DIAGNOSIS — L89613 Pressure ulcer of right heel, stage 3: Secondary | ICD-10-CM | POA: Diagnosis not present

## 2021-10-05 DIAGNOSIS — D631 Anemia in chronic kidney disease: Secondary | ICD-10-CM | POA: Diagnosis not present

## 2021-10-05 DIAGNOSIS — N2581 Secondary hyperparathyroidism of renal origin: Secondary | ICD-10-CM | POA: Diagnosis not present

## 2021-10-05 DIAGNOSIS — R627 Adult failure to thrive: Secondary | ICD-10-CM | POA: Diagnosis not present

## 2021-10-05 DIAGNOSIS — B9562 Methicillin resistant Staphylococcus aureus infection as the cause of diseases classified elsewhere: Secondary | ICD-10-CM | POA: Diagnosis not present

## 2021-10-05 DIAGNOSIS — Z23 Encounter for immunization: Secondary | ICD-10-CM | POA: Diagnosis not present

## 2021-10-05 DIAGNOSIS — T8571XD Infection and inflammatory reaction due to peritoneal dialysis catheter, subsequent encounter: Secondary | ICD-10-CM | POA: Diagnosis not present

## 2021-10-05 DIAGNOSIS — N186 End stage renal disease: Secondary | ICD-10-CM | POA: Diagnosis not present

## 2021-10-05 DIAGNOSIS — I959 Hypotension, unspecified: Secondary | ICD-10-CM | POA: Diagnosis not present

## 2021-10-06 ENCOUNTER — Ambulatory Visit (HOSPITAL_COMMUNITY)
Admission: RE | Admit: 2021-10-06 | Discharge: 2021-10-06 | Disposition: A | Discharge status: Home / Self Care | Payer: Medicare Other | Source: Ambulatory Visit | Attending: Internal Medicine | Admitting: Internal Medicine

## 2021-10-06 ENCOUNTER — Other Ambulatory Visit: Payer: Self-pay

## 2021-10-06 DIAGNOSIS — L89613 Pressure ulcer of right heel, stage 3: Secondary | ICD-10-CM | POA: Diagnosis not present

## 2021-10-06 DIAGNOSIS — N2581 Secondary hyperparathyroidism of renal origin: Secondary | ICD-10-CM | POA: Diagnosis not present

## 2021-10-06 DIAGNOSIS — Z23 Encounter for immunization: Secondary | ICD-10-CM | POA: Diagnosis not present

## 2021-10-06 DIAGNOSIS — N186 End stage renal disease: Secondary | ICD-10-CM | POA: Diagnosis not present

## 2021-10-06 DIAGNOSIS — D631 Anemia in chronic kidney disease: Secondary | ICD-10-CM | POA: Diagnosis not present

## 2021-10-06 NOTE — Progress Notes (Signed)
VASCULAR LAB   TBIs have been performed.  See CV proc for preliminary results.   Draya Felker, RVT 10/06/2021, 9:45 AM

## 2021-10-07 DIAGNOSIS — D631 Anemia in chronic kidney disease: Secondary | ICD-10-CM | POA: Diagnosis not present

## 2021-10-07 DIAGNOSIS — N186 End stage renal disease: Secondary | ICD-10-CM | POA: Diagnosis not present

## 2021-10-07 DIAGNOSIS — L89613 Pressure ulcer of right heel, stage 3: Secondary | ICD-10-CM | POA: Diagnosis not present

## 2021-10-07 DIAGNOSIS — R627 Adult failure to thrive: Secondary | ICD-10-CM | POA: Diagnosis not present

## 2021-10-07 DIAGNOSIS — B9562 Methicillin resistant Staphylococcus aureus infection as the cause of diseases classified elsewhere: Secondary | ICD-10-CM | POA: Diagnosis not present

## 2021-10-07 DIAGNOSIS — Z23 Encounter for immunization: Secondary | ICD-10-CM | POA: Diagnosis not present

## 2021-10-07 DIAGNOSIS — N2581 Secondary hyperparathyroidism of renal origin: Secondary | ICD-10-CM | POA: Diagnosis not present

## 2021-10-07 DIAGNOSIS — T8571XD Infection and inflammatory reaction due to peritoneal dialysis catheter, subsequent encounter: Secondary | ICD-10-CM | POA: Diagnosis not present

## 2021-10-07 DIAGNOSIS — K652 Spontaneous bacterial peritonitis: Secondary | ICD-10-CM | POA: Diagnosis not present

## 2021-10-07 DIAGNOSIS — I959 Hypotension, unspecified: Secondary | ICD-10-CM | POA: Diagnosis not present

## 2021-10-08 DIAGNOSIS — N186 End stage renal disease: Secondary | ICD-10-CM | POA: Diagnosis not present

## 2021-10-08 DIAGNOSIS — Z23 Encounter for immunization: Secondary | ICD-10-CM | POA: Diagnosis not present

## 2021-10-08 DIAGNOSIS — D631 Anemia in chronic kidney disease: Secondary | ICD-10-CM | POA: Diagnosis not present

## 2021-10-08 DIAGNOSIS — N2581 Secondary hyperparathyroidism of renal origin: Secondary | ICD-10-CM | POA: Diagnosis not present

## 2021-10-09 DIAGNOSIS — D631 Anemia in chronic kidney disease: Secondary | ICD-10-CM | POA: Diagnosis not present

## 2021-10-09 DIAGNOSIS — N186 End stage renal disease: Secondary | ICD-10-CM | POA: Diagnosis not present

## 2021-10-09 DIAGNOSIS — Z23 Encounter for immunization: Secondary | ICD-10-CM | POA: Diagnosis not present

## 2021-10-09 DIAGNOSIS — N2581 Secondary hyperparathyroidism of renal origin: Secondary | ICD-10-CM | POA: Diagnosis not present

## 2021-10-10 ENCOUNTER — Encounter (HOSPITAL_BASED_OUTPATIENT_CLINIC_OR_DEPARTMENT_OTHER): Payer: Medicare Other | Admitting: Internal Medicine

## 2021-10-10 ENCOUNTER — Other Ambulatory Visit: Payer: Self-pay

## 2021-10-10 DIAGNOSIS — N186 End stage renal disease: Secondary | ICD-10-CM | POA: Diagnosis not present

## 2021-10-10 DIAGNOSIS — Z992 Dependence on renal dialysis: Secondary | ICD-10-CM | POA: Diagnosis not present

## 2021-10-10 DIAGNOSIS — D631 Anemia in chronic kidney disease: Secondary | ICD-10-CM | POA: Diagnosis not present

## 2021-10-10 DIAGNOSIS — L89610 Pressure ulcer of right heel, unstageable: Secondary | ICD-10-CM | POA: Diagnosis not present

## 2021-10-10 DIAGNOSIS — F259 Schizoaffective disorder, unspecified: Secondary | ICD-10-CM

## 2021-10-10 DIAGNOSIS — L89613 Pressure ulcer of right heel, stage 3: Secondary | ICD-10-CM | POA: Diagnosis not present

## 2021-10-10 DIAGNOSIS — Z23 Encounter for immunization: Secondary | ICD-10-CM | POA: Diagnosis not present

## 2021-10-10 DIAGNOSIS — N2581 Secondary hyperparathyroidism of renal origin: Secondary | ICD-10-CM | POA: Diagnosis not present

## 2021-10-10 NOTE — Progress Notes (Signed)
Jane [name], Jane [name] ([id number]) Visit Report for 10/10/2021 Arrival Information Details Patient Name: Date of Service: Jane [name], Jane [name]. 10/10/2021 9:15 A M Medical Record Number: [id number] Patient Account Number: [id number] Date of Birth/Sex: Treating RN: Aug 05, 1966 (55 y.o. [name] Primary Care Renan Danese: [name] Other Clinician: Referring Kabrea Seeney: Treating Taegan Standage/Extender: Carlus Pavlov in Treatment: 3 Visit Information History Since Last Visit Added or deleted any medications: No Patient Arrived: Wheel Chair Any new allergies or adverse reactions: No Arrival Time: 09:17 Had a fall or experienced change in No Accompanied By: Brother in Law activities of daily living that may affect Transfer Assistance: Manual risk of falls: Patient Identification Verified: Yes Signs or symptoms of abuse/neglect since last visito No Secondary Verification Process Completed: Yes Hospitalized since last visit: No Patient Requires Transmission-Based Precautions: No Implantable device outside of the clinic excluding No Patient Has Alerts: Yes cellular tissue based products placed in the center Patient Alerts: R ABI=Non Comp since last visit: Has Dressing in Place as Prescribed: Yes Pain Present Now: No Electronic Signature(s) Signed: 10/10/2021 3:53:37 PM By: Lorrin Jackson Entered By: Lorrin Jackson on 10/10/2021 09:23:10 -------------------------------------------------------------------------------- Encounter Discharge Information Details Patient Name: Date of Service: Jane Nurse M. 10/10/2021 9:15 A M Medical Record Number: [id number] Patient Account Number: [id number] Date of Birth/Sex: Treating RN: 02-Sep-1966 (55 y.o. [name] Primary Care Tiffany Talarico: [name] Other Clinician: Referring Kaelan Amble: Treating Candy Ziegler/Extender: Carlus Pavlov in Treatment: 3 Encounter Discharge  Information Items Post Procedure Vitals Discharge Condition: Stable Temperature (F): 97.5 Ambulatory Status: Wheelchair Pulse (bpm): 96 Discharge Destination: Home Respiratory Rate (breaths/min): 16 Transportation: Other Blood Pressure (mmHg): 131/84 Accompanied By: Brother in Law Schedule Follow-up Appointment: Yes Clinical Summary of Care: Provided on 10/10/2021 Form Type Recipient Paper Patient Patient Electronic Signature(s) Signed: 10/10/2021 10:07:13 AM By: Lorrin Jackson Entered By: Lorrin Jackson on 10/10/2021 10:07:13 -------------------------------------------------------------------------------- Lower Extremity Assessment Details Patient Name: Date of Service: Jane [name], OZIMEK. 10/10/2021 9:15 A M Medical Record Number: [id number] Patient Account Number: [id number] Date of Birth/Sex: Treating RN: 1966-10-14 (55 y.o. [name] Primary Care Derk Doubek: [name] Other Clinician: Referring Kemonie Cutillo: Treating Alainna Stawicki/Extender: Carlus Pavlov in Treatment: 3 Edema Assessment Assessed: [name]: No] Patrice Paradise: Yes] Edema: [Left: Ye] [Right: s] Calf Left: Right: Point of Measurement: 30 cm From Medial Instep 36 cm Ankle Left: Right: Point of Measurement: 7 cm From Medial Instep 25 cm Vascular Assessment Pulses: Dorsalis Pedis Palpable: [Right:Yes] Electronic Signature(s) Signed: 10/10/2021 3:53:37 PM By: Lorrin Jackson Entered By: Lorrin Jackson on 10/10/2021 09:34:29 -------------------------------------------------------------------------------- Multi Wound Chart Details Patient Name: Date of Service: Jane Nurse M. 10/10/2021 9:15 A M Medical Record Number: [id number] Patient Account Number: [id number] Date of Birth/Sex: Treating RN: [birth year] (55 y.o. [name] Primary Care Marin Milley: [name] Other Clinician: Referring Ashby Leflore: Treating Jadelyn Elks/Extender: Carlus Pavlov in Treatment: 3 Vital Signs Height(in): Pulse(bpm): 96 Weight(lbs): Blood Pressure(mmHg): 131/84 Body Mass Index(BMI): Temperature(F): 97.5 Respiratory Rate(breaths/min): 16 Photos: [1:No Photos Right, Medial Calcaneus] [2:No Photos Right Calcaneus] [N/A:N/A N/A] Wound Location: [1:Pressure Injury] [2:Pressure Injury] [N/A:N/A] Wounding Event: [1:Pressure Ulcer] [2:Pressure Ulcer] [N/A:N/A] Primary Etiology: [1:Cataracts, Anemia, Congestive Heart Cataracts, Anemia, Congestive Heart N/A] Comorbid History: [1:Failure, Hypotension, End Stage Renal Failure, Hypotension, End Stage Renal Disease, Seizure Disorder 06/22/2021] [2:Disease, Seizure Disorder 10/03/2021] [N/A:N/A] Date Acquired: [1:3] [2:1] [N/A:N/A] Weeks of Treatment: [1:Open] [2:Open] [N/A:N/A] Wound Status: [1:0.4x0.4x0.1] [2:1.5x2x0.1] [N/A:N/A] Measurements L x W x D (cm) [  1:0.126] [2:2.356] [N/A:N/A] A (cm) : rea [1:0.013] [2:0.236] [N/A:N/A] Volume (cm) : [1:83.90%] [2:55.40%] [N/A:N/A] % Reduction in A rea: [1:94.50%] [2:55.30%] [N/A:N/A] % Reduction in Volume: [1:Category/Stage II] [2:Unstageable/Unclassified] [N/A:N/A] Classification: [1:Medium] [2:Medium] [N/A:N/A] Exudate A mount: [1:Serosanguineous] [2:Serosanguineous] [N/A:N/A] Exudate Type: [1:red, brown] [2:red, brown] [N/A:N/A] Exudate Color: [1:Distinct, outline attached] [2:Distinct, outline attached] [N/A:N/A] Wound Margin: [1:Large (67-100%)] [2:Medium (34-66%)] [N/A:N/A] Granulation A mount: [1:Red, Pink] [2:Red] [N/A:N/A] Granulation Quality: [1:None Present (0%)] [2:Medium (34-66%)] [N/A:N/A] Necrotic A mount: [1:N/A] [2:Eschar] [N/A:N/A] Necrotic Tissue: [1:Fat Layer (Subcutaneous Tissue): Yes Fat Layer (Subcutaneous Tissue): Yes N/A] Exposed Structures: [1:Fascia: No Tendon: No Muscle: No Joint: No Bone: No Medium (34-66%)] [2:Fascia: No Tendon: No Muscle: No Joint: No Bone: No Medium (34-66%)] [N/A:N/A] Epithelialization: [1:N/A]  [2:Chemical/Enzymatic/Mechanical] [N/A:N/A] Debridement: Pre-procedure Verification/Time Out N/A [2:09:52] [N/A:N/A] Taken: [1:N/A] [2:Other] [N/A:N/A] Pain Control: [1:N/A] [2:N/A] [N/A:N/A] Instrument: [1:N/A] [2:None] [N/A:N/A] Bleeding: [1:N/A] [2:Procedure was tolerated well] [N/A:N/A] Debridement Treatment Response: [1:N/A] [2:1.5x2x0.1] [N/A:N/A] Post Debridement Measurements L x W x D (cm) [1:N/A] [2:0.236] [N/A:N/A] Post Debridement Volume: (cm) [1:N/A] [2:Unstageable/Unclassified] [N/A:N/A] Post Debridement Stage: [1:calloused periwound] [2:N/A] [N/A:N/A] Assessment Notes: [1:N/A] [2:Debridement] [N/A:N/A] Treatment Notes Wound #1 (Calcaneus) Wound Laterality: Right, Medial Cleanser Soap and Water Discharge Instruction: May shower and wash wound with dial antibacterial soap and water prior to dressing change. Peri-Wound Care Skin Prep Discharge Instruction: Use skin prep as directed Topical Primary Dressing Hydrofera Blue Ready Foam, 2.5 x2.5 in Discharge Instruction: Apply to wound bed as instructed Santyl Ointment Discharge Instruction: Apply nickel thick amount to wound bed as instructed Secondary Dressing Woven Gauze Sponge, Non-Sterile 4x4 in Discharge Instruction: Apply over primary dressing as directed. ALLEVYN Heel 4 1/2in x 5 1/2in / 10.5cm x 13.5cm Discharge Instruction: Apply over primary dressing as directed. Secured With [employer], 4.5x3.1 (in/yd) Discharge Instruction: Secure with Kerlix as directed. Paper Tape, 2x10 (in/yd) Discharge Instruction: Secure dressing with tape as directed. Compression Wrap Compression Stockings Add-Ons Wound #2 (Calcaneus) Wound Laterality: Right Cleanser Soap and Water Discharge Instruction: May shower and wash wound with dial antibacterial soap and water prior to dressing change. Peri-Wound Care Skin Prep Discharge Instruction: Use skin prep as directed Topical Primary Dressing Hydrofera Blue Ready  Foam, 2.5 x2.5 in Discharge Instruction: Apply to wound bed as instructed Santyl Ointment Discharge Instruction: Apply nickel thick amount to wound bed as instructed Secondary Dressing Woven Gauze Sponge, Non-Sterile 4x4 in Discharge Instruction: Apply over primary dressing as directed. ALLEVYN Heel 4 1/2in x 5 1/2in / 10.5cm x 13.5cm Discharge Instruction: Apply over primary dressing as directed. Secured With [employer], 4.5x3.1 (in/yd) Discharge Instruction: Secure with Kerlix as directed. Paper Tape, 2x10 (in/yd) Discharge Instruction: Secure dressing with tape as directed. Compression Wrap Compression Stockings Add-Ons Electronic Signature(s) Signed: 10/10/2021 11:36:12 AM By: [name] DO Signed: 10/10/2021 3:53:37 PM By: Lorrin Jackson Entered By: [name] on 10/10/2021 11:05:12 -------------------------------------------------------------------------------- Multi-Disciplinary Care Plan Details Patient Name: Date of Service: Jane Nurse M. 10/10/2021 9:15 A M Medical Record Number: [id number] Patient Account Number: [id number] Date of Birth/Sex: Treating RN: 1965/10/26 (55 y.o. [name] Primary Care Zavien Clubb: [name] Other Clinician: Referring Mayer Vondrak: Treating Makara Lanzo/Extender: Carlus Pavlov in Treatment: 3 Active Inactive Pressure Nursing Diagnoses: Knowledge deficit related to causes and risk factors for pressure ulcer development Goals: Patient will remain free from development of additional pressure ulcers Date Initiated: 09/19/2021 Target Resolution Date: 10/17/2021 Goal Status: Active Interventions: Assess: immobility, friction, shearing, incontinence upon admission and as needed Assess  offloading mechanisms upon admission and as needed Treatment Activities: Pressure reduction/relief device ordered : 09/19/2021 T ordered outside of clinic : 09/19/2021 est Notes: Wound/Skin  Impairment Nursing Diagnoses: Impaired tissue integrity Goals: Patient/caregiver will verbalize understanding of skin care regimen Date Initiated: 09/19/2021 Target Resolution Date: 10/17/2021 Goal Status: Active Ulcer/skin breakdown will have a volume reduction of 30% by week 4 Date Initiated: 09/19/2021 Target Resolution Date: 10/17/2021 Goal Status: Active Interventions: Assess patient/caregiver ability to obtain necessary supplies Assess patient/caregiver ability to perform ulcer/skin care regimen upon admission and as needed Assess ulceration(s) every visit Provide education on ulcer and skin care Treatment Activities: Topical wound management initiated : 09/19/2021 Notes: Electronic Signature(s) Signed: 10/10/2021 3:53:37 PM By: Lorrin Jackson Entered By: Lorrin Jackson on 10/10/2021 09:34:58 -------------------------------------------------------------------------------- Pain Assessment Details Patient Name: Date of Service: Jane Nurse M. 10/10/2021 9:15 A M Medical Record Number: [id number] Patient Account Number: [id number] Date of Birth/Sex: Treating RN: 1966/10/07 (55 y.o. [name] Primary Care Corey Caulfield: [name] Other Clinician: Referring Kongmeng Santoro: Treating Jazmeen Axtell/Extender: Carlus Pavlov in Treatment: 3 Active Problems Location of Pain Severity and Description of Pain Patient Has Paino No Site Locations Pain Management and Medication Current Pain Management: Notes unable to accurately assess due to mental status Electronic Signature(s) Signed: 10/10/2021 3:53:37 PM By: Lorrin Jackson Entered By: Lorrin Jackson on 10/10/2021 09:23:46 -------------------------------------------------------------------------------- Patient/Caregiver Education Details Patient Name: Date of Service: Jane Dry. 12/19/2022andnbsp9:15 A M Medical Record Number: [id number] Patient Account Number: [id number] Date  of Birth/Gender: Treating RN: [birth year] (55 y.o. [name] Primary Care Physician: [name] Other Clinician: Referring Physician: Treating Physician/Extender: Carlus Pavlov in Treatment: 3 Education Assessment Education Provided To: Caregiver Education Topics Provided Offloading: Methods: Explain/Verbal, Printed Responses: State content correctly Wound/Skin Impairment: Methods: Explain/Verbal, Printed Responses: State content correctly Electronic Signature(s) Signed: 10/10/2021 3:53:37 PM By: Lorrin Jackson Entered By: Lorrin Jackson on 10/10/2021 09:35:24 -------------------------------------------------------------------------------- Wound Assessment Details Patient Name: Date of Service: Jane Nurse M. 10/10/2021 9:15 A M Medical Record Number: [id number] Patient Account Number: [id number] Date of Birth/Sex: Treating RN: Aug 19, 1966 (55 y.o. [name] Primary Care Carmelia Tiner: [name] Other Clinician: Referring Alvin Rubano: Treating Shaketa Serafin/Extender: Carlus Pavlov in Treatment: 3 Wound Status Wound Number: 1 Primary Pressure Ulcer Etiology: Wound Location: Right, Medial Calcaneus Wound Open Wounding Event: Pressure Injury Status: Date Acquired: 06/22/2021 Comorbid Cataracts, Anemia, Congestive Heart Failure, Hypotension, End Weeks Of Treatment: 3 History: Stage Renal Disease, Seizure Disorder Clustered Wound: No Wound Measurements Length: (cm) 0.4 Width: (cm) 0.4 Depth: (cm) 0.1 Area: (cm) 0.126 Volume: (cm) 0.013 % Reduction in Area: 83.9% % Reduction in Volume: 94.5% Epithelialization: Medium (34-66%) Tunneling: No Undermining: No Wound Description Classification: Category/Stage II Wound Margin: Distinct, outline attached Exudate Amount: Medium Exudate Type: Serosanguineous Exudate Color: red, brown Foul Odor After Cleansing: No Slough/Fibrino Yes Wound  Bed Granulation Amount: Large (67-100%) Exposed Structure Granulation Quality: Red, Pink Fascia Exposed: No Necrotic Amount: None Present (0%) Fat Layer (Subcutaneous Tissue) Exposed: Yes Tendon Exposed: No Muscle Exposed: No Joint Exposed: No Bone Exposed: No Assessment Notes calloused periwound Treatment Notes Wound #1 (Calcaneus) Wound Laterality: Right, Medial Cleanser Soap and Water Discharge Instruction: May shower and wash wound with dial antibacterial soap and water prior to dressing change. Peri-Wound Care Skin Prep Discharge Instruction: Use skin prep as directed Topical Primary Dressing Hydrofera Blue Ready Foam, 2.5 x2.5 in Discharge Instruction: Apply to wound bed as instructed Santyl Ointment Discharge Instruction:  Apply nickel thick amount to wound bed as instructed Secondary Dressing Woven Gauze Sponge, Non-Sterile 4x4 in Discharge Instruction: Apply over primary dressing as directed. ALLEVYN Heel 4 1/2in x 5 1/2in / 10.5cm x 13.5cm Discharge Instruction: Apply over primary dressing as directed. Secured With [employer], 4.5x3.1 (in/yd) Discharge Instruction: Secure with Kerlix as directed. Paper Tape, 2x10 (in/yd) Discharge Instruction: Secure dressing with tape as directed. Compression Wrap Compression Stockings Add-Ons Electronic Signature(s) Signed: 10/10/2021 3:53:37 PM By: Lorrin Jackson Entered By: Lorrin Jackson on 10/10/2021 09:31:00 -------------------------------------------------------------------------------- Wound Assessment Details Patient Name: Date of Service: Jane [name], DECKMAN. 10/10/2021 9:15 A M Medical Record Number: [id number] Patient Account Number: [id number] Date of Birth/Sex: Treating RN: 10-Dec-1965 (55 y.o. [name] Primary Care Raymel Cull: [name] Other Clinician: Referring Angy Swearengin: Treating Jennice Renegar/Extender: Carlus Pavlov in Treatment: 3 Wound Status Wound  Number: 2 Primary Pressure Ulcer Etiology: Wound Location: Right Calcaneus Wound Open Wounding Event: Pressure Injury Status: Date Acquired: 10/03/2021 Comorbid Cataracts, Anemia, Congestive Heart Failure, Hypotension, End Weeks Of Treatment: 1 History: Stage Renal Disease, Seizure Disorder Clustered Wound: No Wound Measurements Length: (cm) 1.5 Width: (cm) 2 Depth: (cm) 0.1 Area: (cm) 2.356 Volume: (cm) 0.236 % Reduction in Area: 55.4% % Reduction in Volume: 55.3% Epithelialization: Medium (34-66%) Tunneling: No Undermining: No Wound Description Classification: Unstageable/Unclassified Wound Margin: Distinct, outline attached Exudate Amount: Medium Exudate Type: Serosanguineous Exudate Color: red, brown Foul Odor After Cleansing: No Slough/Fibrino No Wound Bed Granulation Amount: Medium (34-66%) Exposed Structure Granulation Quality: Red Fascia Exposed: No Necrotic Amount: Medium (34-66%) Fat Layer (Subcutaneous Tissue) Exposed: Yes Necrotic Quality: Eschar Tendon Exposed: No Muscle Exposed: No Joint Exposed: No Bone Exposed: No Treatment Notes Wound #2 (Calcaneus) Wound Laterality: Right Cleanser Soap and Water Discharge Instruction: May shower and wash wound with dial antibacterial soap and water prior to dressing change. Peri-Wound Care Skin Prep Discharge Instruction: Use skin prep as directed Topical Primary Dressing Hydrofera Blue Ready Foam, 2.5 x2.5 in Discharge Instruction: Apply to wound bed as instructed Santyl Ointment Discharge Instruction: Apply nickel thick amount to wound bed as instructed Secondary Dressing Woven Gauze Sponge, Non-Sterile 4x4 in Discharge Instruction: Apply over primary dressing as directed. ALLEVYN Heel 4 1/2in x 5 1/2in / 10.5cm x 13.5cm Discharge Instruction: Apply over primary dressing as directed. Secured With [employer], 4.5x3.1 (in/yd) Discharge Instruction: Secure with Kerlix as directed. Paper Tape,  2x10 (in/yd) Discharge Instruction: Secure dressing with tape as directed. Compression Wrap Compression Stockings Add-Ons Electronic Signature(s) Signed: 10/10/2021 3:53:37 PM By: Lorrin Jackson Entered By: Lorrin Jackson on 10/10/2021 09:30:15 -------------------------------------------------------------------------------- Vitals Details Patient Name: Date of Service: Jane Nurse M. 10/10/2021 9:15 A M Medical Record Number: [id number] Patient Account Number: [id number] Date of Birth/Sex: Treating RN: 01-12-1966 (55 y.o. [name] Primary Care Jimmylee Ratterree: [name] Other Clinician: Referring Auguste Tebbetts: Treating Shaquan Puerta/Extender: Carlus Pavlov in Treatment: 3 Vital Signs Time Taken: 09:24 Temperature (F): 97.5 Pulse (bpm): 96 Respiratory Rate (breaths/min): 16 Blood Pressure (mmHg): 131/84 Reference Range: 80 - 120 mg / dl Electronic Signature(s) Signed: 10/10/2021 3:53:37 PM By: Lorrin Jackson Entered By: Lorrin Jackson on 10/10/2021 09:25:52

## 2021-10-10 NOTE — Progress Notes (Signed)
MIALANI, REICKS ([id number]) Visit Report for 10/10/2021 Chief Complaint Document Details Patient Name: Date of Service: Jane [name], Jane [name]. 10/10/2021 9:15 A M Medical Record Number: [id number] Patient Account Number: [id number] Date of Birth/Sex: Treating RN: September 19, 1966 (55 y.o. [name] Primary Care Provider: [name] Other Clinician: Referring Provider: Treating Provider/Extender: Carlus Pavlov in Treatment: 3 Information Obtained from: Patient Chief Complaint Right heel wound Electronic Signature(s) Signed: 10/10/2021 11:36:12 AM By: [name] DO Entered By: [name] on 10/10/2021 11:05:32 -------------------------------------------------------------------------------- Debridement Details Patient Name: Date of Service: Jane Nurse M. 10/10/2021 9:15 A M Medical Record Number: [id number] Patient Account Number: [id number] Date of Birth/Sex: Treating RN: May 11, 1966 (55 y.o. [name] Primary Care Provider: [name] Other Clinician: Referring Provider: Treating Provider/Extender: Carlus Pavlov in Treatment: 3 Debridement Performed for Assessment: Wound #2 Right Calcaneus Performed By: Physician [name], DO Debridement Type: Chemical/Enzymatic/Mechanical Agent Used: Santyl Level of Consciousness (Pre-procedure): Awake and Alert Pre-procedure Verification/Time Out Yes - 09:52 Taken: Start Time: 09:53 Pain Control: Other : Benzocaine Bleeding: None Response to Treatment: Procedure was tolerated well Level of Consciousness (Post- Awake and Alert procedure): Post Debridement Measurements of Total Wound Length: (cm) 1.5 Stage: Unstageable/Unclassified Width: (cm) 2 Depth: (cm) 0.1 Volume: (cm) 0.236 Character of Wound/Ulcer Post Debridement: Stable Post Procedure Diagnosis Same as Pre-procedure Electronic Signature(s) Signed: 10/10/2021  11:36:12 AM By: [name] DO Signed: 10/10/2021 3:53:37 PM By: Lorrin Jackson Entered By: Lorrin Jackson on 10/10/2021 10:01:38 -------------------------------------------------------------------------------- HPI Details Patient Name: Date of Service: Jane Nurse M. 10/10/2021 9:15 A M Medical Record Number: [id number] Patient Account Number: [id number] Date of Birth/Sex: Treating RN: 1966/09/19 (55 y.o. [name] Primary Care Provider: [name] Other Clinician: Referring Provider: Treating Provider/Extender: Carlus Pavlov in Treatment: 3 History of Present Illness HPI Description: Admission 09/19/2021 Ms. Machel Violante is a 55 year old female with a past medical history of schizoaffective disorder, end-stage renal disease on peritoneal dialysis and seizure disorder that presents to the clinic for a right heel wound that has been present since her hospitalization in April 2022 because of an epidural abscess that required surgery. During her hospitalization she developed the heel wound and a breast wound. She would lean more aggressively to the left side for long periods of time. The breast wound has healed. The right heel Has developed a loose callus. She is not mobile and is either in her wheelchair or in the bed most of the day. She currently denies pain, increased warmth or erythema to the area or drainage. Son-in-law is present and helps with the history. Patient is unable to participate fully in her care due to her diagnosis of schizoaffective disorder. 10/03/2021; patient presents for follow-up. She has developed a new pressure injury to the calcaneus and has skin breakdown to the surrounding area. She has been using Hydrofera Blue to the previous wound site. She has no issues or complaints today. She denies signs of infection. 12/19; patient presents for follow-up. She has been using Hydrofera Blue to the wound sites. She  went back and obtained TBI's. She currently denies signs of infection. Electronic Signature(s) Signed: 10/10/2021 11:36:12 AM By: [name] DO Entered By: [name] on 10/10/2021 11:09:59 -------------------------------------------------------------------------------- Physical Exam Details Patient Name: Date of Service: Jane [name], Jane [name]. 10/10/2021 9:15 A M Medical Record Number: [id number] Patient Account Number: [id number] Date of Birth/Sex: Treating RN: [birth year] (55 y.o. [name] Primary Care  Provider: [name] Other Clinician: Referring Provider: Treating Provider/Extender: Carlus Pavlov in Treatment: 3 Constitutional respirations regular, non-labored and within target range for patient.. Cardiovascular 2+ dorsalis pedis/posterior tibialis pulses. Psychiatric pleasant and cooperative. Notes Right heel: T the medial aspect there is an open wound with granulation tissue and scant nonviable tissue. Directly to the posterior aspect there is a an open o wound with eschar and granulation tissue. No obvious signs of infection. Electronic Signature(s) Signed: 10/10/2021 11:36:12 AM By: [name] DO Entered By: [name] on 10/10/2021 11:11:07 -------------------------------------------------------------------------------- Physician Orders Details Patient Name: Date of Service: Jane Nurse M. 10/10/2021 9:15 A M Medical Record Number: [id number] Patient Account Number: [id number] Date of Birth/Sex: Treating RN: Jul 31, 1966 (55 y.o. [name] Primary Care Provider: [name] Other Clinician: Referring Provider: Treating Provider/Extender: Carlus Pavlov in Treatment: 3 Verbal / Phone Orders: No Diagnosis Coding ICD-10 Coding Code Description [phone] Pressure ulcer of right heel, stage 3 F25.9 Schizoaffective disorder, unspecified N18.6 End stage  renal disease L89.610 Pressure ulcer of right heel, unstageable Follow-up Appointments ppointment in 2 weeks. - with Dr. Heber [place] Return A Bathing/ Shower/ Hygiene May shower and wash wound with soap and water. - when changing dressing Edema Control - Lymphedema / SCD / Other Elevate legs to the level of the heart or above for 30 minutes daily and/or when sitting, a frequency of: Off-Loading Other: - Prevalon Boot to right foot Additional Orders / Instructions Follow Nutritious Diet Wound Treatment Wound #1 - Calcaneus Wound Laterality: Right, Medial Cleanser: Soap and Water 1 x Per Day/30 Days Discharge Instructions: May shower and wash wound with dial antibacterial soap and water prior to dressing change. Peri-Wound Care: Skin Prep 1 x Per Day/30 Days Discharge Instructions: Use skin prep as directed Prim Dressing: Hydrofera Blue Ready Foam, 2.5 x2.5 in 1 x Per Day/30 Days ary Discharge Instructions: Apply to wound bed as instructed Prim Dressing: Santyl Ointment 1 x Per Day/30 Days ary Discharge Instructions: Apply nickel thick amount to wound bed as instructed Secondary Dressing: Woven Gauze Sponge, Non-Sterile 4x4 in 1 x Per Day/30 Days Discharge Instructions: Apply over primary dressing as directed. Secondary Dressing: ALLEVYN Heel 4 1/2in x 5 1/2in / 10.5cm x 13.5cm 1 x Per Day/30 Days Discharge Instructions: Apply over primary dressing as directed. Secured With: [employer], 4.5x3.1 (in/yd) 1 x Per Day/30 Days Discharge Instructions: Secure with Kerlix as directed. Secured With: Paper Tape, 2x10 (in/yd) 1 x Per Day/30 Days Discharge Instructions: Secure dressing with tape as directed. Wound #2 - Calcaneus Wound Laterality: Right Cleanser: Soap and Water 1 x Per Day/30 Days Discharge Instructions: May shower and wash wound with dial antibacterial soap and water prior to dressing change. Peri-Wound Care: Skin Prep 1 x Per Day/30 Days Discharge Instructions: Use skin  prep as directed Prim Dressing: Hydrofera Blue Ready Foam, 2.5 x2.5 in 1 x Per Day/30 Days ary Discharge Instructions: Apply to wound bed as instructed Prim Dressing: Santyl Ointment 1 x Per Day/30 Days ary Discharge Instructions: Apply nickel thick amount to wound bed as instructed Secondary Dressing: Woven Gauze Sponge, Non-Sterile 4x4 in 1 x Per Day/30 Days Discharge Instructions: Apply over primary dressing as directed. Secondary Dressing: ALLEVYN Heel 4 1/2in x 5 1/2in / 10.5cm x 13.5cm 1 x Per Day/30 Days Discharge Instructions: Apply over primary dressing as directed. Secured With: [employer], 4.5x3.1 (in/yd) 1 x Per Day/30 Days Discharge Instructions: Secure with Kerlix as directed. Secured  With: Paper Tape, 2x10 (in/yd) 1 x Per Day/30 Days Discharge Instructions: Secure dressing with tape as directed. Patient Medications llergies: No Known Allergies A Notifications Medication Indication Start End 10/10/2021 Santyl DOSE 1 - topical 250 unit/gram ointment - 1 application daily Electronic Signature(s) Signed: 10/10/2021 11:36:12 AM By: [name] DO Previous Signature: 10/10/2021 11:13:11 AM Version By: [name] DO Entered By: [name] on 10/10/2021 11:14:24 -------------------------------------------------------------------------------- Problem List Details Patient Name: Date of Service: Jane Nurse M. 10/10/2021 9:15 A M Medical Record Number: [id number] Patient Account Number: [id number] Date of Birth/Sex: Treating RN: 1966/05/15 (55 y.o. [name] Primary Care Provider: [name] Other Clinician: Referring Provider: Treating Provider/Extender: Carlus Pavlov in Treatment: 3 Active Problems ICD-10 Encounter Code Description Active Date MDM Diagnosis L89.613 Pressure ulcer of right heel, stage 3 09/19/2021 No Yes F25.9 Schizoaffective disorder, unspecified 09/19/2021 No Yes N18.6  End stage renal disease 09/19/2021 No Yes L89.610 Pressure ulcer of right heel, unstageable 10/03/2021 No Yes Inactive Problems Resolved Problems Electronic Signature(s) Signed: 10/10/2021 11:36:12 AM By: [name] DO Entered By: [name] on 10/10/2021 11:04:53 -------------------------------------------------------------------------------- Progress Note Details Patient Name: Date of Service: Jane Nurse M. 10/10/2021 9:15 A M Medical Record Number: [id number] Patient Account Number: [id number] Date of Birth/Sex: Treating RN: 1966/07/01 (55 y.o. [name] Primary Care Provider: [name] Other Clinician: Referring Provider: Treating Provider/Extender: Carlus Pavlov in Treatment: 3 Subjective Chief Complaint Information obtained from Patient Right heel wound History of Present Illness (HPI) Admission 09/19/2021 Ms. Jane [name] is a 55 year old female with a past medical history of schizoaffective disorder, end-stage renal disease on peritoneal dialysis and seizure disorder that presents to the clinic for a right heel wound that has been present since her hospitalization in April 2022 because of an epidural abscess that required surgery. During her hospitalization she developed the heel wound and a breast wound. She would lean more aggressively to the left side for long periods of time. The breast wound has healed. The right heel Has developed a loose callus. She is not mobile and is either in her wheelchair or in the bed most of the day. She currently denies pain, increased warmth or erythema to the area or drainage. Son-in-law is present and helps with the history. Patient is unable to participate fully in her care due to her diagnosis of schizoaffective disorder. 10/03/2021; patient presents for follow-up. She has developed a new pressure injury to the calcaneus and has skin breakdown to the surrounding area. She  has been using Hydrofera Blue to the previous wound site. She has no issues or complaints today. She denies signs of infection. 12/19; patient presents for follow-up. She has been using Hydrofera Blue to the wound sites. She went back and obtained TBI's. She currently denies signs of infection. Patient History Information obtained from Patient, Caregiver. Family History Cancer - Mother, Kidney Disease - Maternal Grandparents, No family history of Diabetes, Heart Disease, Hereditary Spherocytosis, Hypertension, Lung Disease, Seizures, Stroke, Thyroid Problems, Tuberculosis. Social History Never smoker, Marital Status - Single, Alcohol Use - Never, Drug Use - No History, Caffeine Use - Rarely. Medical History Eyes Patient has history of Cataracts Hematologic/Lymphatic Patient has history of Anemia Cardiovascular Patient has history of Congestive Heart Failure, Hypotension Denies history of Hypertension Genitourinary Patient has history of End Stage Renal Disease - Dialysis Neurologic Patient has history of Seizure Disorder - last seizure several years ago Medical A Surgical History Notes nd Endocrine  Hypothyroidism Musculoskeletal Laminectomy 05/2021 Psychiatric Schizoaffective Disorder Objective Constitutional respirations regular, non-labored and within target range for patient.. Vitals Time Taken: 9:24 AM, Temperature: 97.5 F, Pulse: 96 bpm, Respiratory Rate: 16 breaths/min, Blood Pressure: 131/84 mmHg. Cardiovascular 2+ dorsalis pedis/posterior tibialis pulses. Psychiatric pleasant and cooperative. General Notes: Right heel: T the medial aspect there is an open wound with granulation tissue and scant nonviable tissue. Directly to the posterior aspect o there is a an open wound with eschar and granulation tissue. No obvious signs of infection. Integumentary (Hair, Skin) Wound #1 status is Open. Original cause of wound was Pressure Injury. The date acquired was: 06/22/2021.  The wound has been in treatment 3 weeks. The wound is located on the Right,Medial Calcaneus. The wound measures 0.4cm length x 0.4cm width x 0.1cm depth; 0.126cm^2 area and 0.013cm^3 volume. There is Fat Layer (Subcutaneous Tissue) exposed. There is no tunneling or undermining noted. There is a medium amount of serosanguineous drainage noted. The wound margin is distinct with the outline attached to the wound base. There is large (67-100%) red, pink granulation within the wound bed. There is no necrotic tissue within the wound bed. General Notes: calloused periwound Wound #2 status is Open. Original cause of wound was Pressure Injury. The date acquired was: 10/03/2021. The wound has been in treatment 1 weeks. The wound is located on the Right Calcaneus. The wound measures 1.5cm length x 2cm width x 0.1cm depth; 2.356cm^2 area and 0.236cm^3 volume. There is Fat Layer (Subcutaneous Tissue) exposed. There is no tunneling or undermining noted. There is a medium amount of serosanguineous drainage noted. The wound margin is distinct with the outline attached to the wound base. There is medium (34-66%) red granulation within the wound bed. There is a medium (34-66%) amount of necrotic tissue within the wound bed including Eschar. Assessment Active Problems ICD-10 Pressure ulcer of right heel, stage 3 Schizoaffective disorder, unspecified End stage renal disease Pressure ulcer of right heel, unstageable Patient's wounds are stable. Now that she has an eschar I recommended using Santyl to the wound bed along with Hydrofera Blue. No signs of infection on exam. She had TBI's completed that were within normal range. 1.04 on the right and 1.04 on the left. It did however suggest there was a component of arterial occlusive disease due to the arterial Doppler waveforms at the ankle. For now we will continue with conservative wound care. Procedures Wound #2 Pre-procedure diagnosis of Wound #2 is a Pressure  Ulcer located on the Right Calcaneus . There was a Chemical/Enzymatic/Mechanical debridement performed by [name], DO. after achieving pain control using Other (Benzocaine). Agent used was [employer]. A time out was conducted at 09:52, prior to the start of the procedure. There was no bleeding. The procedure was tolerated well. Post Debridement Measurements: 1.5cm length x 2cm width x 0.1cm depth; 0.236cm^3 volume. Post debridement Stage noted as Unstageable/Unclassified. Character of Wound/Ulcer Post Debridement is stable. Post procedure Diagnosis Wound #2: Same as Pre-Procedure Plan Follow-up Appointments: Return Appointment in 2 weeks. - with Dr. Heber Bix[place] Bathing/ Shower/ Hygiene: May shower and wash wound with soap and water. - when changing dressing Edema Control - Lymphedema / SCD / Other: Elevate legs to the level of the heart or above for 30 minutes daily and/or when sitting, a frequency of: Off-Loading: Other: - Prevalon Boot to right foot Additional Orders / Instructions: Follow Nutritious Diet The following medication(s) was prescribed: Santyl topical 250 unit/gram ointment 1 1 application daily starting 10/10/2021 WOUND #1: - Calcaneus  Wound Laterality: Right, Medial Cleanser: Soap and Water 1 x Per Day/30 Days Discharge Instructions: May shower and wash wound with dial antibacterial soap and water prior to dressing change. Peri-Wound Care: Skin Prep 1 x Per Day/30 Days Discharge Instructions: Use skin prep as directed Prim Dressing: Hydrofera Blue Ready Foam, 2.5 x2.5 in 1 x Per Day/30 Days ary Discharge Instructions: Apply to wound bed as instructed Prim Dressing: Santyl Ointment 1 x Per Day/30 Days ary Discharge Instructions: Apply nickel thick amount to wound bed as instructed Secondary Dressing: Woven Gauze Sponge, Non-Sterile 4x4 in 1 x Per Day/30 Days Discharge Instructions: Apply over primary dressing as directed. Secondary Dressing: ALLEVYN Heel 4 1/2in x 5  1/2in / 10.5cm x 13.5cm 1 x Per Day/30 Days Discharge Instructions: Apply over primary dressing as directed. Secured With: [employer], 4.5x3.1 (in/yd) 1 x Per Day/30 Days Discharge Instructions: Secure with Kerlix as directed. Secured With: Paper T ape, 2x10 (in/yd) 1 x Per Day/30 Days Discharge Instructions: Secure dressing with tape as directed. WOUND #2: - Calcaneus Wound Laterality: Right Cleanser: Soap and Water 1 x Per Day/30 Days Discharge Instructions: May shower and wash wound with dial antibacterial soap and water prior to dressing change. Peri-Wound Care: Skin Prep 1 x Per Day/30 Days Discharge Instructions: Use skin prep as directed Prim Dressing: Hydrofera Blue Ready Foam, 2.5 x2.5 in 1 x Per Day/30 Days ary Discharge Instructions: Apply to wound bed as instructed Prim Dressing: Santyl Ointment 1 x Per Day/30 Days ary Discharge Instructions: Apply nickel thick amount to wound bed as instructed Secondary Dressing: Woven Gauze Sponge, Non-Sterile 4x4 in 1 x Per Day/30 Days Discharge Instructions: Apply over primary dressing as directed. Secondary Dressing: ALLEVYN Heel 4 1/2in x 5 1/2in / 10.5cm x 13.5cm 1 x Per Day/30 Days Discharge Instructions: Apply over primary dressing as directed. Secured With: [employer], 4.5x3.1 (in/yd) 1 x Per Day/30 Days Discharge Instructions: Secure with Kerlix as directed. Secured With: Paper T ape, 2x10 (in/yd) 1 x Per Day/30 Days Discharge Instructions: Secure dressing with tape as directed. 1. Santyl 2. Hydrofera Blue 3. Follow-up in 2 weeks 4. Aggressive offloading Electronic Signature(s) Signed: 10/10/2021 11:36:12 AM By: [name] DO Entered By: [name] on 10/10/2021 11:16:06 -------------------------------------------------------------------------------- HxROS Details Patient Name: Date of Service: Jane Nurse M. 10/10/2021 9:15 A M Medical Record Number: [id number] Patient Account Number:  [id number] Date of Birth/Sex: Treating RN: 1966-01-23 (55 y.o. [name] Primary Care Provider: [name] Other Clinician: Referring Provider: Treating Provider/Extender: Carlus Pavlov in Treatment: 3 Information Obtained From Patient Caregiver Eyes Medical History: Positive for: Cataracts Hematologic/Lymphatic Medical History: Positive for: Anemia Cardiovascular Medical History: Positive for: Congestive Heart Failure; Hypotension Negative for: Hypertension Endocrine Medical History: Past Medical History Notes: Hypothyroidism Genitourinary Medical History: Positive for: End Stage Renal Disease - Dialysis Musculoskeletal Medical History: Past Medical History Notes: Laminectomy 05/2021 Neurologic Medical History: Positive for: Seizure Disorder - last seizure several years ago Psychiatric Medical History: Past Medical History Notes: Schizoaffective Disorder HBO Extended History Items Eyes: Cataracts Immunizations Pneumococcal Vaccine: Received Pneumococcal Vaccination: No Implantable Devices Yes Family and Social History Cancer: Yes - Mother; Diabetes: No; Heart Disease: No; Hereditary Spherocytosis: No; Hypertension: No; Kidney Disease: Yes - Maternal Grandparents; Lung Disease: No; Seizures: No; Stroke: No; Thyroid Problems: No; Tuberculosis: No; Never smoker; Marital Status - Single; Alcohol Use: Never; Drug Use: No History; Caffeine Use: Rarely; Financial Concerns: No; Food, Clothing or Shelter Needs: No; Support System  Lacking: No; Transportation Concerns: No Electronic Signature(s) Signed: 10/10/2021 11:36:12 AM By: [name] DO Signed: 10/10/2021 3:53:37 PM By: Lorrin Jackson Entered By: [name] on 10/10/2021 11:10:09 -------------------------------------------------------------------------------- SuperBill Details Patient Name: Date of Service: Jane Nurse M. 10/10/2021 Medical Record  Number: [id number] Patient Account Number: [id number] Date of Birth/Sex: Treating RN: 06-16-1966 (55 y.o. [name] Primary Care Provider: [name] Other Clinician: Referring Provider: Treating Provider/Extender: Carlus Pavlov in Treatment: 3 Diagnosis Coding ICD-10 Codes Code Description [phone] Pressure ulcer of right heel, stage 3 F25.9 Schizoaffective disorder, unspecified N18.6 End stage renal disease L89.610 Pressure ulcer of right heel, unstageable Facility Procedures CPT4 Code: [id number] Description: [phone] - [facility]BRIDE W/O ANES NON SELECT ICD-10 Diagnosis Description L89.613 Pressure ulcer of right heel, stage 3 Modifier: Quantity: 1 Physician Procedures : CPT4 Code Description Modifier [id number] - WC PHYS LEVEL 3 - EST PT ICD-10 Diagnosis Description L89.613 Pressure ulcer of right heel, stage 3 F25.9 Schizoaffective disorder, unspecified N18.6 End stage renal disease L89.610 Pressure ulcer of right  heel, unstageable Quantity: 1 Electronic Signature(s) Signed: 10/10/2021 11:36:12 AM By: [name] DO Entered By: [name] on 10/10/2021 11:35:36

## 2021-10-11 DIAGNOSIS — N2581 Secondary hyperparathyroidism of renal origin: Secondary | ICD-10-CM | POA: Diagnosis not present

## 2021-10-11 DIAGNOSIS — N186 End stage renal disease: Secondary | ICD-10-CM | POA: Diagnosis not present

## 2021-10-11 DIAGNOSIS — D631 Anemia in chronic kidney disease: Secondary | ICD-10-CM | POA: Diagnosis not present

## 2021-10-11 DIAGNOSIS — Z23 Encounter for immunization: Secondary | ICD-10-CM | POA: Diagnosis not present

## 2021-10-12 DIAGNOSIS — N186 End stage renal disease: Secondary | ICD-10-CM | POA: Diagnosis not present

## 2021-10-12 DIAGNOSIS — Z23 Encounter for immunization: Secondary | ICD-10-CM | POA: Diagnosis not present

## 2021-10-12 DIAGNOSIS — K652 Spontaneous bacterial peritonitis: Secondary | ICD-10-CM | POA: Diagnosis not present

## 2021-10-12 DIAGNOSIS — B9562 Methicillin resistant Staphylococcus aureus infection as the cause of diseases classified elsewhere: Secondary | ICD-10-CM | POA: Diagnosis not present

## 2021-10-12 DIAGNOSIS — I959 Hypotension, unspecified: Secondary | ICD-10-CM | POA: Diagnosis not present

## 2021-10-12 DIAGNOSIS — D631 Anemia in chronic kidney disease: Secondary | ICD-10-CM | POA: Diagnosis not present

## 2021-10-12 DIAGNOSIS — N2581 Secondary hyperparathyroidism of renal origin: Secondary | ICD-10-CM | POA: Diagnosis not present

## 2021-10-12 DIAGNOSIS — R627 Adult failure to thrive: Secondary | ICD-10-CM | POA: Diagnosis not present

## 2021-10-12 DIAGNOSIS — T8571XD Infection and inflammatory reaction due to peritoneal dialysis catheter, subsequent encounter: Secondary | ICD-10-CM | POA: Diagnosis not present

## 2021-10-12 DIAGNOSIS — L89613 Pressure ulcer of right heel, stage 3: Secondary | ICD-10-CM | POA: Diagnosis not present

## 2021-10-13 DIAGNOSIS — N2581 Secondary hyperparathyroidism of renal origin: Secondary | ICD-10-CM | POA: Diagnosis not present

## 2021-10-13 DIAGNOSIS — B9562 Methicillin resistant Staphylococcus aureus infection as the cause of diseases classified elsewhere: Secondary | ICD-10-CM | POA: Diagnosis not present

## 2021-10-13 DIAGNOSIS — K652 Spontaneous bacterial peritonitis: Secondary | ICD-10-CM | POA: Diagnosis not present

## 2021-10-13 DIAGNOSIS — N186 End stage renal disease: Secondary | ICD-10-CM | POA: Diagnosis not present

## 2021-10-13 DIAGNOSIS — L89613 Pressure ulcer of right heel, stage 3: Secondary | ICD-10-CM | POA: Diagnosis not present

## 2021-10-13 DIAGNOSIS — D631 Anemia in chronic kidney disease: Secondary | ICD-10-CM | POA: Diagnosis not present

## 2021-10-13 DIAGNOSIS — I959 Hypotension, unspecified: Secondary | ICD-10-CM | POA: Diagnosis not present

## 2021-10-13 DIAGNOSIS — T8571XD Infection and inflammatory reaction due to peritoneal dialysis catheter, subsequent encounter: Secondary | ICD-10-CM | POA: Diagnosis not present

## 2021-10-13 DIAGNOSIS — Z23 Encounter for immunization: Secondary | ICD-10-CM | POA: Diagnosis not present

## 2021-10-13 DIAGNOSIS — R627 Adult failure to thrive: Secondary | ICD-10-CM | POA: Diagnosis not present

## 2021-10-14 DIAGNOSIS — E440 Moderate protein-calorie malnutrition: Secondary | ICD-10-CM | POA: Diagnosis not present

## 2021-10-14 DIAGNOSIS — R188 Other ascites: Secondary | ICD-10-CM | POA: Diagnosis not present

## 2021-10-14 DIAGNOSIS — N186 End stage renal disease: Secondary | ICD-10-CM | POA: Diagnosis not present

## 2021-10-14 DIAGNOSIS — Z793 Long term (current) use of hormonal contraceptives: Secondary | ICD-10-CM | POA: Diagnosis not present

## 2021-10-14 DIAGNOSIS — Z992 Dependence on renal dialysis: Secondary | ICD-10-CM | POA: Diagnosis not present

## 2021-10-14 DIAGNOSIS — U071 COVID-19: Secondary | ICD-10-CM | POA: Diagnosis not present

## 2021-10-14 DIAGNOSIS — I700 Atherosclerosis of aorta: Secondary | ICD-10-CM | POA: Diagnosis not present

## 2021-10-14 DIAGNOSIS — N2581 Secondary hyperparathyroidism of renal origin: Secondary | ICD-10-CM | POA: Diagnosis not present

## 2021-10-14 DIAGNOSIS — T8571XD Infection and inflammatory reaction due to peritoneal dialysis catheter, subsequent encounter: Secondary | ICD-10-CM | POA: Diagnosis not present

## 2021-10-14 DIAGNOSIS — R627 Adult failure to thrive: Secondary | ICD-10-CM | POA: Diagnosis not present

## 2021-10-14 DIAGNOSIS — F259 Schizoaffective disorder, unspecified: Secondary | ICD-10-CM | POA: Diagnosis not present

## 2021-10-14 DIAGNOSIS — L89613 Pressure ulcer of right heel, stage 3: Secondary | ICD-10-CM | POA: Diagnosis not present

## 2021-10-14 DIAGNOSIS — I5020 Unspecified systolic (congestive) heart failure: Secondary | ICD-10-CM | POA: Diagnosis not present

## 2021-10-14 DIAGNOSIS — I959 Hypotension, unspecified: Secondary | ICD-10-CM | POA: Diagnosis not present

## 2021-10-14 DIAGNOSIS — Z23 Encounter for immunization: Secondary | ICD-10-CM | POA: Diagnosis not present

## 2021-10-14 DIAGNOSIS — B9562 Methicillin resistant Staphylococcus aureus infection as the cause of diseases classified elsewhere: Secondary | ICD-10-CM | POA: Diagnosis not present

## 2021-10-14 DIAGNOSIS — Z8701 Personal history of pneumonia (recurrent): Secondary | ICD-10-CM | POA: Diagnosis not present

## 2021-10-14 DIAGNOSIS — K8010 Calculus of gallbladder with chronic cholecystitis without obstruction: Secondary | ICD-10-CM | POA: Diagnosis not present

## 2021-10-14 DIAGNOSIS — K652 Spontaneous bacterial peritonitis: Secondary | ICD-10-CM | POA: Diagnosis not present

## 2021-10-14 DIAGNOSIS — G40909 Epilepsy, unspecified, not intractable, without status epilepticus: Secondary | ICD-10-CM | POA: Diagnosis not present

## 2021-10-14 DIAGNOSIS — D631 Anemia in chronic kidney disease: Secondary | ICD-10-CM | POA: Diagnosis not present

## 2021-10-14 DIAGNOSIS — Z792 Long term (current) use of antibiotics: Secondary | ICD-10-CM | POA: Diagnosis not present

## 2021-10-14 DIAGNOSIS — R1312 Dysphagia, oropharyngeal phase: Secondary | ICD-10-CM | POA: Diagnosis not present

## 2021-10-15 DIAGNOSIS — N186 End stage renal disease: Secondary | ICD-10-CM | POA: Diagnosis not present

## 2021-10-15 DIAGNOSIS — Z23 Encounter for immunization: Secondary | ICD-10-CM | POA: Diagnosis not present

## 2021-10-15 DIAGNOSIS — N2581 Secondary hyperparathyroidism of renal origin: Secondary | ICD-10-CM | POA: Diagnosis not present

## 2021-10-15 DIAGNOSIS — D631 Anemia in chronic kidney disease: Secondary | ICD-10-CM | POA: Diagnosis not present

## 2021-10-16 DIAGNOSIS — Z23 Encounter for immunization: Secondary | ICD-10-CM | POA: Diagnosis not present

## 2021-10-16 DIAGNOSIS — N2581 Secondary hyperparathyroidism of renal origin: Secondary | ICD-10-CM | POA: Diagnosis not present

## 2021-10-16 DIAGNOSIS — N186 End stage renal disease: Secondary | ICD-10-CM | POA: Diagnosis not present

## 2021-10-16 DIAGNOSIS — D631 Anemia in chronic kidney disease: Secondary | ICD-10-CM | POA: Diagnosis not present

## 2021-10-17 DIAGNOSIS — D631 Anemia in chronic kidney disease: Secondary | ICD-10-CM | POA: Diagnosis not present

## 2021-10-17 DIAGNOSIS — N186 End stage renal disease: Secondary | ICD-10-CM | POA: Diagnosis not present

## 2021-10-17 DIAGNOSIS — Z23 Encounter for immunization: Secondary | ICD-10-CM | POA: Diagnosis not present

## 2021-10-17 DIAGNOSIS — N2581 Secondary hyperparathyroidism of renal origin: Secondary | ICD-10-CM | POA: Diagnosis not present

## 2021-10-18 DIAGNOSIS — N186 End stage renal disease: Secondary | ICD-10-CM | POA: Diagnosis not present

## 2021-10-18 DIAGNOSIS — Z23 Encounter for immunization: Secondary | ICD-10-CM | POA: Diagnosis not present

## 2021-10-18 DIAGNOSIS — N2581 Secondary hyperparathyroidism of renal origin: Secondary | ICD-10-CM | POA: Diagnosis not present

## 2021-10-18 DIAGNOSIS — D631 Anemia in chronic kidney disease: Secondary | ICD-10-CM | POA: Diagnosis not present

## 2021-10-19 DIAGNOSIS — D631 Anemia in chronic kidney disease: Secondary | ICD-10-CM | POA: Diagnosis not present

## 2021-10-19 DIAGNOSIS — N186 End stage renal disease: Secondary | ICD-10-CM | POA: Diagnosis not present

## 2021-10-19 DIAGNOSIS — Z23 Encounter for immunization: Secondary | ICD-10-CM | POA: Diagnosis not present

## 2021-10-19 DIAGNOSIS — N2581 Secondary hyperparathyroidism of renal origin: Secondary | ICD-10-CM | POA: Diagnosis not present

## 2021-10-20 DIAGNOSIS — D631 Anemia in chronic kidney disease: Secondary | ICD-10-CM | POA: Diagnosis not present

## 2021-10-20 DIAGNOSIS — Z23 Encounter for immunization: Secondary | ICD-10-CM | POA: Diagnosis not present

## 2021-10-20 DIAGNOSIS — L89613 Pressure ulcer of right heel, stage 3: Secondary | ICD-10-CM | POA: Diagnosis not present

## 2021-10-20 DIAGNOSIS — B9562 Methicillin resistant Staphylococcus aureus infection as the cause of diseases classified elsewhere: Secondary | ICD-10-CM | POA: Diagnosis not present

## 2021-10-20 DIAGNOSIS — I959 Hypotension, unspecified: Secondary | ICD-10-CM | POA: Diagnosis not present

## 2021-10-20 DIAGNOSIS — K652 Spontaneous bacterial peritonitis: Secondary | ICD-10-CM | POA: Diagnosis not present

## 2021-10-20 DIAGNOSIS — T8571XD Infection and inflammatory reaction due to peritoneal dialysis catheter, subsequent encounter: Secondary | ICD-10-CM | POA: Diagnosis not present

## 2021-10-20 DIAGNOSIS — R627 Adult failure to thrive: Secondary | ICD-10-CM | POA: Diagnosis not present

## 2021-10-20 DIAGNOSIS — N186 End stage renal disease: Secondary | ICD-10-CM | POA: Diagnosis not present

## 2021-10-20 DIAGNOSIS — N2581 Secondary hyperparathyroidism of renal origin: Secondary | ICD-10-CM | POA: Diagnosis not present

## 2021-10-21 DIAGNOSIS — Z23 Encounter for immunization: Secondary | ICD-10-CM | POA: Diagnosis not present

## 2021-10-21 DIAGNOSIS — D631 Anemia in chronic kidney disease: Secondary | ICD-10-CM | POA: Diagnosis not present

## 2021-10-21 DIAGNOSIS — N2581 Secondary hyperparathyroidism of renal origin: Secondary | ICD-10-CM | POA: Diagnosis not present

## 2021-10-21 DIAGNOSIS — N186 End stage renal disease: Secondary | ICD-10-CM | POA: Diagnosis not present

## 2021-10-22 DIAGNOSIS — K652 Spontaneous bacterial peritonitis: Secondary | ICD-10-CM | POA: Diagnosis not present

## 2021-10-22 DIAGNOSIS — R627 Adult failure to thrive: Secondary | ICD-10-CM | POA: Diagnosis not present

## 2021-10-22 DIAGNOSIS — Z23 Encounter for immunization: Secondary | ICD-10-CM | POA: Diagnosis not present

## 2021-10-22 DIAGNOSIS — N2581 Secondary hyperparathyroidism of renal origin: Secondary | ICD-10-CM | POA: Diagnosis not present

## 2021-10-22 DIAGNOSIS — T8571XD Infection and inflammatory reaction due to peritoneal dialysis catheter, subsequent encounter: Secondary | ICD-10-CM | POA: Diagnosis not present

## 2021-10-22 DIAGNOSIS — L89613 Pressure ulcer of right heel, stage 3: Secondary | ICD-10-CM | POA: Diagnosis not present

## 2021-10-22 DIAGNOSIS — I959 Hypotension, unspecified: Secondary | ICD-10-CM | POA: Diagnosis not present

## 2021-10-22 DIAGNOSIS — Z992 Dependence on renal dialysis: Secondary | ICD-10-CM | POA: Diagnosis not present

## 2021-10-22 DIAGNOSIS — B9562 Methicillin resistant Staphylococcus aureus infection as the cause of diseases classified elsewhere: Secondary | ICD-10-CM | POA: Diagnosis not present

## 2021-10-22 DIAGNOSIS — N186 End stage renal disease: Secondary | ICD-10-CM | POA: Diagnosis not present

## 2021-10-22 DIAGNOSIS — D631 Anemia in chronic kidney disease: Secondary | ICD-10-CM | POA: Diagnosis not present

## 2021-10-23 DIAGNOSIS — N186 End stage renal disease: Secondary | ICD-10-CM | POA: Diagnosis not present

## 2021-10-23 DIAGNOSIS — N2581 Secondary hyperparathyroidism of renal origin: Secondary | ICD-10-CM | POA: Diagnosis not present

## 2021-10-23 DIAGNOSIS — D631 Anemia in chronic kidney disease: Secondary | ICD-10-CM | POA: Diagnosis not present

## 2021-10-24 DIAGNOSIS — N2581 Secondary hyperparathyroidism of renal origin: Secondary | ICD-10-CM | POA: Diagnosis not present

## 2021-10-24 DIAGNOSIS — N186 End stage renal disease: Secondary | ICD-10-CM | POA: Diagnosis not present

## 2021-10-24 DIAGNOSIS — D631 Anemia in chronic kidney disease: Secondary | ICD-10-CM | POA: Diagnosis not present

## 2021-10-25 ENCOUNTER — Encounter (HOSPITAL_BASED_OUTPATIENT_CLINIC_OR_DEPARTMENT_OTHER): Payer: Medicare Other | Attending: Internal Medicine | Admitting: Internal Medicine

## 2021-10-25 ENCOUNTER — Other Ambulatory Visit: Payer: Self-pay

## 2021-10-25 DIAGNOSIS — F259 Schizoaffective disorder, unspecified: Secondary | ICD-10-CM | POA: Diagnosis not present

## 2021-10-25 DIAGNOSIS — G40909 Epilepsy, unspecified, not intractable, without status epilepticus: Secondary | ICD-10-CM | POA: Diagnosis not present

## 2021-10-25 DIAGNOSIS — Z992 Dependence on renal dialysis: Secondary | ICD-10-CM | POA: Insufficient documentation

## 2021-10-25 DIAGNOSIS — L89610 Pressure ulcer of right heel, unstageable: Secondary | ICD-10-CM

## 2021-10-25 DIAGNOSIS — E039 Hypothyroidism, unspecified: Secondary | ICD-10-CM | POA: Diagnosis not present

## 2021-10-25 DIAGNOSIS — I509 Heart failure, unspecified: Secondary | ICD-10-CM | POA: Insufficient documentation

## 2021-10-25 DIAGNOSIS — L89613 Pressure ulcer of right heel, stage 3: Secondary | ICD-10-CM | POA: Diagnosis not present

## 2021-10-25 DIAGNOSIS — D631 Anemia in chronic kidney disease: Secondary | ICD-10-CM | POA: Diagnosis not present

## 2021-10-25 DIAGNOSIS — N2581 Secondary hyperparathyroidism of renal origin: Secondary | ICD-10-CM | POA: Diagnosis not present

## 2021-10-25 DIAGNOSIS — N186 End stage renal disease: Secondary | ICD-10-CM | POA: Diagnosis not present

## 2021-10-25 NOTE — Progress Notes (Signed)
Jane [name], Jane [name] ([id number]) Visit Report for 10/25/2021 Chief Complaint Document Details Patient Name: Date of Service: Jane [name], Jane [name]. 10/25/2021 9:15 A M Medical Record Number: [id number] Patient Account Number: [id number] Date of Birth/Sex: Treating RN: [birth year] (56 y.o. F) Primary Care Provider: [name] Other Clinician: Referring Provider: Treating Provider/Extender: Carlus Pavlov in Treatment: 5 Information Obtained from: Patient Chief Complaint Right heel wound Electronic Signature(s) Signed: 10/25/2021 10:40:01 AM By: [name] DO Entered By: [name] on 10/25/2021 10:31:31 -------------------------------------------------------------------------------- Debridement Details Patient Name: Date of Service: [name] 10/25/2021 9:15 A M Medical Record Number: [id number] Patient Account Number: [id number] Date of Birth/Sex: Treating RN: August 06, 1966 (56 y.o. [name] Primary Care Provider: [name] Other Clinician: Referring Provider: Treating Provider/Extender: Carlus Pavlov in Treatment: 5 Debridement Performed for Assessment: Wound #1 Right,Medial Calcaneus Performed By: Physician [name], DO Debridement Type: Debridement Level of Consciousness (Pre-procedure): Awake and Alert Pre-procedure Verification/Time Out Yes - 09:40 Taken: Start Time: 09:43 Pain Control: Other : benzocaine 20% spray T Area Debrided (L x W): otal 0.4 (cm) x 0.4 (cm) = 0.16 (cm) Tissue and other material debrided: Non-Viable, Eschar Level: Non-Viable Tissue Debridement Description: Selective/Open Wound Instrument: Curette Bleeding: None Procedural Pain: 0 Post Procedural Pain: 0 Response to Treatment: Procedure was tolerated well Level of Consciousness (Post- Awake and Alert procedure): Post Debridement Measurements of Total Wound Length: (cm) 0.4 Stage:  Category/Stage II Width: (cm) 0.4 Depth: (cm) 0.1 Volume: (cm) 0.013 Character of Wound/Ulcer Post Debridement: Improved Post Procedure Diagnosis Same as Pre-procedure Electronic Signature(s) Signed: 10/25/2021 10:40:01 AM By: [name] DO Signed: 10/25/2021 5:28:36 PM By: Baruch Gouty RN, BSN Entered By: Baruch Gouty on 10/25/2021 09:59:45 -------------------------------------------------------------------------------- Debridement Details Patient Name: Date of Service: [name] 10/25/2021 9:15 A M Medical Record Number: [id number] Patient Account Number: [id number] Date of Birth/Sex: Treating RN: 08-Nov-1965 (56 y.o. [name] Primary Care Provider: [name] Other Clinician: Referring Provider: Treating Provider/Extender: Carlus Pavlov in Treatment: 5 Debridement Performed for Assessment: Wound #2 Right Calcaneus Performed By: Physician [name], DO Debridement Type: Debridement Level of Consciousness (Pre-procedure): Awake and Alert Pre-procedure Verification/Time Out Yes - 09:40 Taken: Start Time: 09:43 Pain Control: Other : benzocaine 20% spray T Area Debrided (L x W): otal 2 (cm) x 1.9 (cm) = 3.8 (cm) Tissue and other material debrided: Viable, Non-Viable, Eschar, Subcutaneous Level: Skin/Subcutaneous Tissue Debridement Description: Excisional Instrument: Blade, Forceps Bleeding: None Procedural Pain: 0 Post Procedural Pain: 0 Response to Treatment: Procedure was tolerated well Level of Consciousness (Post- Awake and Alert procedure): Post Debridement Measurements of Total Wound Length: (cm) 2 Stage: Unstageable/Unclassified Width: (cm) 1.9 Depth: (cm) 0.1 Volume: (cm) 0.298 Character of Wound/Ulcer Post Debridement: Improved Post Procedure Diagnosis Same as Pre-procedure Electronic Signature(s) Signed: 10/25/2021 10:40:01 AM By: [name] DO Signed: 10/25/2021 5:28:36 PM By:  Baruch Gouty RN, BSN Entered By: Baruch Gouty on 10/25/2021 10:01:48 -------------------------------------------------------------------------------- HPI Details Patient Name: Date of Service: [name] 10/25/2021 9:15 A M Medical Record Number: [id number] Patient Account Number: [id number] Date of Birth/Sex: Treating RN: [birth year] (56 y.o. F) Primary Care Provider: [name] Other Clinician: Referring Provider: Treating Provider/Extender: Carlus Pavlov in Treatment: 5 History of Present Illness HPI Description: Admission 09/19/2021 Jane [name] is a 57 year old female with a past medical history of schizoaffective disorder, end-stage renal disease on peritoneal dialysis and seizure disorder that presents to the  clinic for a right heel wound that has been present since her hospitalization in April 2022 because of an epidural abscess that required surgery. During her hospitalization she developed the heel wound and a breast wound. She would lean more aggressively to the left side for long periods of time. The breast wound has healed. The right heel Has developed a loose callus. She is not mobile and is either in her wheelchair or in the bed most of the day. She currently denies pain, increased warmth or erythema to the area or drainage. Son-in-law is present and helps with the history. Patient is unable to participate fully in her care due to her diagnosis of schizoaffective disorder. 10/03/2021; patient presents for follow-up. She has developed a new pressure injury to the calcaneus and has skin breakdown to the surrounding area. She has been using Hydrofera Blue to the previous wound site. She has no issues or complaints today. She denies signs of infection. 12/19; patient presents for follow-up. She has been using Hydrofera Blue to the wound sites. She went back and obtained TBI's. She currently denies signs of infection. 1/3;  patient presents for follow-up. She has been using Hydrofera Blue to the medial wound and Santyl to the calcaneus wound. She has no issues or complaints today. She denies signs of infection. Electronic Signature(s) Signed: 10/25/2021 10:40:01 AM By: [name] DO Entered By: [name] on 10/25/2021 10:32:04 -------------------------------------------------------------------------------- Physical Exam Details Patient Name: Date of Service: [name] 10/25/2021 9:15 A M Medical Record Number: [id number] Patient Account Number: [id number] Date of Birth/Sex: Treating RN: 1965-11-03 (56 y.o. F) Primary Care Provider: [name] Other Clinician: Referring Provider: Treating Provider/Extender: Carlus Pavlov in Treatment: 5 Constitutional respirations regular, non-labored and within target range for patient.. Cardiovascular 2+ dorsalis pedis/posterior tibialis pulses. Psychiatric pleasant and cooperative. Notes Right heel: T the medial aspect there is an open wound with nonviable tissue and callus. Directly to the posterior aspect there is eschar to the wound site. No o signs of infection. Electronic Signature(s) Signed: 10/25/2021 10:40:01 AM By: [name] DO Entered By: [name] on 10/25/2021 10:36:14 -------------------------------------------------------------------------------- Physician Orders Details Patient Name: Date of Service: [name] 10/25/2021 9:15 A M Medical Record Number: [id number] Patient Account Number: [id number] Date of Birth/Sex: Treating RN: 27-Nov-1965 (56 y.o. [name] Primary Care Provider: [name] Other Clinician: Referring Provider: Treating Provider/Extender: Carlus Pavlov in Treatment: 5 Verbal / Phone Orders: No Diagnosis Coding Follow-up Appointments ppointment in 2 weeks. - with Dr. Heber [place] Return A Bathing/ Shower/  Hygiene May shower and wash wound with soap and water. - when changing dressing Edema Control - Lymphedema / SCD / Other Elevate legs to the level of the heart or above for 30 minutes daily and/or when sitting, a frequency of: Off-Loading Other: - Prevalon Boot to right foot, avoid direct pressure to back of heels Additional Orders / Instructions Follow Nutritious Diet Wound Treatment Wound #1 - Calcaneus Wound Laterality: Right, Medial Cleanser: Soap and Water 1 x Per Day/30 Days Discharge Instructions: May shower and wash wound with dial antibacterial soap and water prior to dressing change. Peri-Wound Care: Skin Prep 1 x Per Day/30 Days Discharge Instructions: Use skin prep as directed Prim Dressing: Hydrofera Blue Ready Foam, 2.5 x2.5 in 1 x Per Day/30 Days ary Discharge Instructions: Apply to wound bed as instructed Prim Dressing: Santyl Ointment 1 x Per Day/30 Days ary Discharge Instructions: Apply nickel thick amount  to wound bed as instructed Secondary Dressing: Woven Gauze Sponge, Non-Sterile 4x4 in 1 x Per Day/30 Days Discharge Instructions: Apply over primary dressing as directed. Secondary Dressing: ALLEVYN Heel 4 1/2in x 5 1/2in / 10.5cm x 13.5cm 1 x Per Day/30 Days Discharge Instructions: Apply over primary dressing as directed. Secured With: [employer], 4.5x3.1 (in/yd) 1 x Per Day/30 Days Discharge Instructions: Secure with Kerlix as directed. Secured With: Paper Tape, 2x10 (in/yd) 1 x Per Day/30 Days Discharge Instructions: Secure dressing with tape as directed. Wound #2 - Calcaneus Wound Laterality: Right Cleanser: Soap and Water 1 x Per Day/30 Days Discharge Instructions: May shower and wash wound with dial antibacterial soap and water prior to dressing change. Peri-Wound Care: Skin Prep 1 x Per Day/30 Days Discharge Instructions: Use skin prep as directed Prim Dressing: Santyl Ointment 1 x Per Day/30 Days ary Discharge Instructions: Apply nickel thick  amount to wound bed as instructed Secondary Dressing: Woven Gauze Sponge, Non-Sterile 4x4 in 1 x Per Day/30 Days Discharge Instructions: Apply over primary dressing as directed. Secondary Dressing: ALLEVYN Heel 4 1/2in x 5 1/2in / 10.5cm x 13.5cm 1 x Per Day/30 Days Discharge Instructions: Apply over primary dressing as directed. Secured With: [employer], 4.5x3.1 (in/yd) 1 x Per Day/30 Days Discharge Instructions: Secure with Kerlix as directed. Secured With: Paper Tape, 2x10 (in/yd) 1 x Per Day/30 Days Discharge Instructions: Secure dressing with tape as directed. Electronic Signature(s) Signed: 10/25/2021 10:40:01 AM By: [name] DO Entered By: [name] on 10/25/2021 10:36:36 -------------------------------------------------------------------------------- Problem List Details Patient Name: Date of Service: [name] 10/25/2021 9:15 A M Medical Record Number: [id number] Patient Account Number: [id number] Date of Birth/Sex: Treating RN: [birth year] (56 y.o. [name] Primary Care Provider: [name] Other Clinician: Referring Provider: Treating Provider/Extender: Carlus Pavlov in Treatment: 5 Active Problems ICD-10 Encounter Code Description Active Date MDM Diagnosis L89.613 Pressure ulcer of right heel, stage 3 09/19/2021 No Yes F25.9 Schizoaffective disorder, unspecified 09/19/2021 No Yes N18.6 End stage renal disease 09/19/2021 No Yes L89.610 Pressure ulcer of right heel, unstageable 10/03/2021 No Yes Inactive Problems Resolved Problems Electronic Signature(s) Signed: 10/25/2021 10:40:01 AM By: [name] DO Entered By: [name] on 10/25/2021 10:31:02 -------------------------------------------------------------------------------- Progress Note Details Patient Name: Date of Service: [name] 10/25/2021 9:15 A M Medical Record Number: [id number] Patient Account Number:  [id number] Date of Birth/Sex: Treating RN: 14-Sep-1966 (56 y.o. F) Primary Care Provider: [name] Other Clinician: Referring Provider: Treating Provider/Extender: Carlus Pavlov in Treatment: 5 Subjective Chief Complaint Information obtained from Patient Right heel wound History of Present Illness (HPI) Admission 09/19/2021 Ms. Jane [name] is a 56 year old female with a past medical history of schizoaffective disorder, end-stage renal disease on peritoneal dialysis and seizure disorder that presents to the clinic for a right heel wound that has been present since her hospitalization in April 2022 because of an epidural abscess that required surgery. During her hospitalization she developed the heel wound and a breast wound. She would lean more aggressively to the left side for long periods of time. The breast wound has healed. The right heel Has developed a loose callus. She is not mobile and is either in her wheelchair or in the bed most of the day. She currently denies pain, increased warmth or erythema to the area or drainage. Son-in-law is present and helps with the history. Patient is unable to participate fully in her care due to her  diagnosis of schizoaffective disorder. 10/03/2021; patient presents for follow-up. She has developed a new pressure injury to the calcaneus and has skin breakdown to the surrounding area. She has been using Hydrofera Blue to the previous wound site. She has no issues or complaints today. She denies signs of infection. 12/19; patient presents for follow-up. She has been using Hydrofera Blue to the wound sites. She went back and obtained TBI's. She currently denies signs of infection. 1/3; patient presents for follow-up. She has been using Hydrofera Blue to the medial wound and Santyl to the calcaneus wound. She has no issues or complaints today. She denies signs of infection. Patient History Information obtained  from Patient, Caregiver. Family History Cancer - Mother, Kidney Disease - Maternal Grandparents, No family history of Diabetes, Heart Disease, Hereditary Spherocytosis, Hypertension, Lung Disease, Seizures, Stroke, Thyroid Problems, Tuberculosis. Social History Never smoker, Marital Status - Single, Alcohol Use - Never, Drug Use - No History, Caffeine Use - Rarely. Medical History Eyes Patient has history of Cataracts Hematologic/Lymphatic Patient has history of Anemia Cardiovascular Patient has history of Congestive Heart Failure, Hypotension Denies history of Hypertension Genitourinary Patient has history of End Stage Renal Disease - Dialysis Neurologic Patient has history of Seizure Disorder - last seizure several years ago Medical A Surgical History Notes nd Endocrine Hypothyroidism Musculoskeletal Laminectomy 05/2021 Psychiatric Schizoaffective Disorder Objective Constitutional respirations regular, non-labored and within target range for patient.. Vitals Time Taken: 9:15 AM, Temperature: 97./6 F, Pulse: 99 bpm, Respiratory Rate: 16 breaths/min, Blood Pressure: 145/84 mmHg. Cardiovascular 2+ dorsalis pedis/posterior tibialis pulses. Psychiatric pleasant and cooperative. General Notes: Right heel: T the medial aspect there is an open wound with nonviable tissue and callus. Directly to the posterior aspect there is eschar to the o wound site. No signs of infection. Integumentary (Hair, Skin) Wound #1 status is Open. Original cause of wound was Pressure Injury. The date acquired was: 06/22/2021. The wound has been in treatment 5 weeks. The wound is located on the Right,Medial Calcaneus. The wound measures 0.4cm length x 0.4cm width x 0.1cm depth; 0.126cm^2 area and 0.013cm^3 volume. There is Fat Layer (Subcutaneous Tissue) exposed. There is no tunneling or undermining noted. There is a medium amount of serosanguineous drainage noted. The wound margin is distinct with the  outline attached to the wound base. There is large (67-100%) red granulation within the wound bed. There is no necrotic tissue within the wound bed. Wound #2 status is Open. Original cause of wound was Pressure Injury. The date acquired was: 10/03/2021. The wound has been in treatment 3 weeks. The wound is located on the Right Calcaneus. The wound measures 2cm length x 1.9cm width x 0.1cm depth; 2.985cm^2 area and 0.298cm^3 volume. There is Fat Layer (Subcutaneous Tissue) exposed. There is no tunneling or undermining noted. There is a medium amount of serosanguineous drainage noted. The wound margin is distinct with the outline attached to the wound base. There is no granulation within the wound bed. There is a large (67-100%) amount of necrotic tissue within the wound bed including Eschar. Assessment Active Problems ICD-10 Pressure ulcer of right heel, stage 3 Schizoaffective disorder, unspecified End stage renal disease Pressure ulcer of right heel, unstageable Patient's wounds have shown slight improvement in appearance since last clinic visit. I debrided nonviable tissue. I recommended using Santyl and Hydrofera Blue to the medial calcaneus wound and Santyl alone to the posterior calcaneus eschar. No signs of infection on exam. Continue aggressive offloading. Follow-up in 1 week Procedures Wound #1 Pre-procedure diagnosis  of Wound #1 is a Pressure Ulcer located on the Right,Medial Calcaneus . There was a Selective/Open Wound Non-Viable Tissue Debridement with a total area of 0.16 sq cm performed by [name], DO. With the following instrument(s): Curette to remove Non-Viable tissue/material. Material removed includes Eschar after achieving pain control using Other (benzocaine 20% spray). No specimens were taken. A time out was conducted at 09:40, prior to the start of the procedure. There was no bleeding. The procedure was tolerated well with a pain level of 0 throughout and a  pain level of 0 following the procedure. Post Debridement Measurements: 0.4cm length x 0.4cm width x 0.1cm depth; 0.013cm^3 volume. Post debridement Stage noted as Category/Stage II. Character of Wound/Ulcer Post Debridement is improved. Post procedure Diagnosis Wound #1: Same as Pre-Procedure Wound #2 Pre-procedure diagnosis of Wound #2 is a Pressure Ulcer located on the Right Calcaneus . There was a Excisional Skin/Subcutaneous Tissue Debridement with a total area of 3.8 sq cm performed by [name], DO. With the following instrument(s): Blade, and Forceps to remove Viable and Non-Viable tissue/material. Material removed includes Eschar and Subcutaneous Tissue and after achieving pain control using Other (benzocaine 20% spray). No specimens were taken. A time out was conducted at 09:40, prior to the start of the procedure. There was no bleeding. The procedure was tolerated well with a pain level of 0 throughout and a pain level of 0 following the procedure. Post Debridement Measurements: 2cm length x 1.9cm width x 0.1cm depth; 0.298cm^3 volume. Post debridement Stage noted as Unstageable/Unclassified. Character of Wound/Ulcer Post Debridement is improved. Post procedure Diagnosis Wound #2: Same as Pre-Procedure Plan Follow-up Appointments: Return Appointment in 2 weeks. - with Dr. Heber [place] Bathing/ Shower/ Hygiene: May shower and wash wound with soap and water. - when changing dressing Edema Control - Lymphedema / SCD / Other: Elevate legs to the level of the heart or above for 30 minutes daily and/or when sitting, a frequency of: Off-Loading: Other: - Prevalon Boot to right foot, avoid direct pressure to back of heels Additional Orders / Instructions: Follow Nutritious Diet WOUND #1: - Calcaneus Wound Laterality: Right, Medial Cleanser: Soap and Water 1 x Per Day/30 Days Discharge Instructions: May shower and wash wound with dial antibacterial soap and water prior to dressing  change. Peri-Wound Care: Skin Prep 1 x Per Day/30 Days Discharge Instructions: Use skin prep as directed Prim Dressing: Hydrofera Blue Ready Foam, 2.5 x2.5 in 1 x Per Day/30 Days ary Discharge Instructions: Apply to wound bed as instructed Prim Dressing: Santyl Ointment 1 x Per Day/30 Days ary Discharge Instructions: Apply nickel thick amount to wound bed as instructed Secondary Dressing: Woven Gauze Sponge, Non-Sterile 4x4 in 1 x Per Day/30 Days Discharge Instructions: Apply over primary dressing as directed. Secondary Dressing: ALLEVYN Heel 4 1/2in x 5 1/2in / 10.5cm x 13.5cm 1 x Per Day/30 Days Discharge Instructions: Apply over primary dressing as directed. Secured With: [employer], 4.5x3.1 (in/yd) 1 x Per Day/30 Days Discharge Instructions: Secure with Kerlix as directed. Secured With: Paper T ape, 2x10 (in/yd) 1 x Per Day/30 Days Discharge Instructions: Secure dressing with tape as directed. WOUND #2: - Calcaneus Wound Laterality: Right Cleanser: Soap and Water 1 x Per Day/30 Days Discharge Instructions: May shower and wash wound with dial antibacterial soap and water prior to dressing change. Peri-Wound Care: Skin Prep 1 x Per Day/30 Days Discharge Instructions: Use skin prep as directed Prim Dressing: Santyl Ointment 1 x Per Day/30 Days ary Discharge Instructions: Apply  nickel thick amount to wound bed as instructed Secondary Dressing: Woven Gauze Sponge, Non-Sterile 4x4 in 1 x Per Day/30 Days Discharge Instructions: Apply over primary dressing as directed. Secondary Dressing: ALLEVYN Heel 4 1/2in x 5 1/2in / 10.5cm x 13.5cm 1 x Per Day/30 Days Discharge Instructions: Apply over primary dressing as directed. Secured With: [employer], 4.5x3.1 (in/yd) 1 x Per Day/30 Days Discharge Instructions: Secure with Kerlix as directed. Secured With: Paper T ape, 2x10 (in/yd) 1 x Per Day/30 Days Discharge Instructions: Secure dressing with tape as directed. 1. In office  sharp debridement 2. Santyl 3. Hydrofera Blue 4. Aggressive offloading 5. Follow-up in 1 week Electronic Signature(s) Signed: 10/25/2021 10:40:01 AM By: [name] DO Entered By: [name] on 10/25/2021 10:38:30 -------------------------------------------------------------------------------- HxROS Details Patient Name: Date of Service: [name] 10/25/2021 9:15 A M Medical Record Number: [id number] Patient Account Number: [id number] Date of Birth/Sex: Treating RN: 01/27/1966 (56 y.o. F) Primary Care Provider: [name] Other Clinician: Referring Provider: Treating Provider/Extender: Carlus Pavlov in Treatment: 5 Information Obtained From Patient Caregiver Eyes Medical History: Positive for: Cataracts Hematologic/Lymphatic Medical History: Positive for: Anemia Cardiovascular Medical History: Positive for: Congestive Heart Failure; Hypotension Negative for: Hypertension Endocrine Medical History: Past Medical History Notes: Hypothyroidism Genitourinary Medical History: Positive for: End Stage Renal Disease - Dialysis Musculoskeletal Medical History: Past Medical History Notes: Laminectomy 05/2021 Neurologic Medical History: Positive for: Seizure Disorder - last seizure several years ago Psychiatric Medical History: Past Medical History Notes: Schizoaffective Disorder HBO Extended History Items Eyes: Cataracts Immunizations Pneumococcal Vaccine: Received Pneumococcal Vaccination: No Implantable Devices Yes Family and Social History Cancer: Yes - Mother; Diabetes: No; Heart Disease: No; Hereditary Spherocytosis: No; Hypertension: No; Kidney Disease: Yes - Maternal Grandparents; Lung Disease: No; Seizures: No; Stroke: No; Thyroid Problems: No; Tuberculosis: No; Never smoker; Marital Status - Single; Alcohol Use: Never; Drug Use: No History; Caffeine Use: Rarely; Financial Concerns: No; Food, Clothing or  Shelter Needs: No; Support System Lacking: No; Transportation Concerns: No Electronic Signature(s) Signed: 10/25/2021 10:40:01 AM By: [name] DO Entered By: [name] on 10/25/2021 10:32:13 -------------------------------------------------------------------------------- SuperBill Details Patient Name: Date of Service: [name] 10/25/2021 Medical Record Number: [id number] Patient Account Number: [id number] Date of Birth/Sex: Treating RN: 1966-06-23 (56 y.o. [name] Primary Care Provider: [name] Other Clinician: Referring Provider: Treating Provider/Extender: Carlus Pavlov in Treatment: 5 Diagnosis Coding ICD-10 Codes Code Description [phone] Pressure ulcer of right heel, stage 3 F25.9 Schizoaffective disorder, unspecified N18.6 End stage renal disease L89.610 Pressure ulcer of right heel, unstageable Facility Procedures CPT4 Code: [id number] Description: 09470 - DEB SUBQ TISSUE 20 SQ CM/< ICD-10 Diagnosis Description L89.610 Pressure ulcer of right heel, unstageable Modifier: Quantity: 1 CPT4 Code: [id number] Description: 94765 - DEBRIDE WOUND 1ST 20 SQ CM OR < ICD-10 Diagnosis Description L89.613 Pressure ulcer of right heel, stage 3 Modifier: Quantity: 1 Physician Procedures : CPT4 Code Description Modifier [id number] - WC PHYS SUBQ TISS 20 SQ CM ICD-10 Diagnosis Description L89.610 Pressure ulcer of right heel, unstageable Quantity: 1 Electronic Signature(s) Signed: 10/25/2021 10:40:01 AM By: [name] DO Entered By: [name] on 10/25/2021 10:39:07

## 2021-10-25 NOTE — Progress Notes (Signed)
Jane [name], Jane [name] ([id number]) Visit Report for 10/25/2021 Arrival Information Details Patient Name: Date of Service: [name], [name]. 10/25/2021 9:15 A M Medical Record Number: [id number] Patient Account Number: [id number] Date of Birth/Sex: Treating RN: 1966/09/13 (56 y.o. Jane [name] Primary Care Jane [name]: Jane [name] Other Clinician: Referring Jane [name]: Treating Jane [name]: Jane [name] in Treatment: 5 Visit Information History Since Last Visit Added or deleted any medications: No Patient Arrived: Jane [name] Any new allergies or adverse reactions: No Arrival Time: 09:11 Had a fall or experienced change in No Accompanied By: caregiver activities of daily living that may affect Transfer Assistance: Other risk of falls: Patient Identification Verified: Yes Signs or symptoms of abuse/neglect since last visito No Patient Requires Transmission-Based Precautions: No Hospitalized since last visit: No Patient Has Alerts: Yes Implantable device outside of the clinic excluding No Patient Alerts: R ABI=Non Comp cellular tissue based products placed in the center since last visit: Has Dressing in Place as Prescribed: Yes Pain Present Now: Yes Electronic Signature(s) Signed: 10/25/2021 5:19:42 PM By: Dellie Catholic RN Entered By: Dellie Catholic on 10/25/2021 09:17:42 -------------------------------------------------------------------------------- Encounter Discharge Information Details Patient Name: Date of Service: Jane Nurse M. 10/25/2021 9:15 A M Medical Record Number: [id number] Patient Account Number: [id number] Date of Birth/Sex: Treating RN: 1966-06-20 (56 y.o. Jane [name] Primary Care Jane [name]: Jane [name] Other Clinician: Referring Jane [name]: Treating Jane [name]/Extender: Jane [name] in Treatment: 5 Encounter Discharge Information Items Post Procedure Vitals Discharge Condition:  Stable Temperature (F): 97.6 Ambulatory Status: Wheelchair Pulse (bpm): 99 Discharge Destination: Home Respiratory Rate (breaths/min): 18 Transportation: Other Blood Pressure (mmHg): 145/84 Accompanied By: son Schedule Follow-up Appointment: Yes Clinical Summary of Care: Patient Declined Electronic Signature(s) Signed: 10/25/2021 5:28:36 PM By: Baruch Gouty RN, BSN Entered By: Baruch Gouty on 10/25/2021 10:18:19 -------------------------------------------------------------------------------- Lower Extremity Assessment Details Patient Name: Date of Service: Jane Nurse M. 10/25/2021 9:15 A M Medical Record Number: [id number] Patient Account Number: [id number] Date of Birth/Sex: Treating RN: 1966/06/21 (56 y.o. Jane [name] Primary Care Jacobi Nile: Jane [name] Other Clinician: Referring Jane [name]: Treating Jane [name]/Extender: Jane [name] in Treatment: 5 Edema Assessment Assessed: Jane [name]: No] [Right: No] Edema: [Left: Ye] [Right: s] Calf Left: Right: Point of Measurement: 30 cm From Medial Instep 36.1 cm Ankle Left: Right: Point of Measurement: 7 cm From Medial Instep 24.6 cm Electronic Signature(s) Signed: 10/25/2021 5:19:42 PM By: Dellie Catholic RN Entered By: Dellie Catholic on 10/25/2021 09:21:16 -------------------------------------------------------------------------------- Multi Wound Chart Details Patient Name: Date of Service: Jane Nurse M. 10/25/2021 9:15 A M Medical Record Number: [id number] Patient Account Number: [id number] Date of Birth/Sex: Treating RN: [birth year] (56 y.o. F) Primary Care Kodi Guerrera: Jane [name] Other Clinician: Referring Carolle [name]: Treating Jane [name]/Extender: Jane [name] in Treatment: 5 Vital Signs Height(in): Pulse(bpm): 99 Weight(lbs): Blood Pressure(mmHg): 145/84 Body Mass Index(BMI): Temperature(F): 97./6 Respiratory Rate(breaths/min):  16 Photos: [N/A:N/A] Right, Medial Calcaneus Right Calcaneus N/A Wound Location: Pressure Injury Pressure Injury N/A Wounding Event: Pressure Ulcer Pressure Ulcer N/A Primary Etiology: Cataracts, Anemia, Congestive Heart Cataracts, Anemia, Congestive Heart N/A Comorbid History: Failure, Hypotension, End Stage Renal Failure, Hypotension, End Stage Renal Disease, Seizure Disorder Disease, Seizure Disorder 06/22/2021 10/03/2021 N/A Date Acquired: 5 3 N/A Weeks of Treatment: Open Open N/A Wound Status: 0.4x0.4x0.1 2x1.9x0.1 N/A Measurements L x W x D (cm) 0.126 2.985 N/A A (cm) : rea 0.013 0.298 N/A Volume (cm) : 83.90% 43.40% N/A % Reduction in A rea: 94.50% 43.60% N/A % Reduction  in Volume: Category/Stage II Unstageable/Unclassified N/A Classification: Medium Medium N/A Exudate A mount: Serosanguineous Serosanguineous N/A Exudate Type: red, [name] red, [name] N/A Exudate Color: Distinct, outline attached Distinct, outline attached N/A Wound Margin: Large (67-100%) None Present (0%) N/A Granulation A mount: Red N/A N/A Granulation Quality: None Present (0%) Large (67-100%) N/A Necrotic A mount: N/A Eschar N/A Necrotic Tissue: Fat Layer (Subcutaneous Tissue): Yes Fat Layer (Subcutaneous Tissue): Yes N/A Exposed Structures: Fascia: No Fascia: No Tendon: No Tendon: No Muscle: No Muscle: No Joint: No Joint: No Bone: No Bone: No Small (1-33%) None N/A Epithelialization: Debridement - Selective/Open Wound Debridement - Excisional N/A Debridement: Pre-procedure Verification/Time Out 09:40 09:40 N/A Taken: Other Other N/A Pain Control: Necrotic/Eschar Necrotic/Eschar, Subcutaneous N/A Tissue Debrided: Non-Viable Tissue Skin/Subcutaneous Tissue N/A Level: 0.16 3.8 N/A Debridement A (sq cm): rea Curette Blade, Forceps N/A Instrument: None None N/A Bleeding: 0 0 N/A Procedural Pain: 0 0 N/A Post Procedural Pain: Procedure was tolerated well Procedure  was tolerated well N/A Debridement Treatment Response: 0.4x0.4x0.1 2x1.9x0.1 N/A Post Debridement Measurements L x W x D (cm) 0.013 0.298 N/A Post Debridement Volume: (cm) Category/Stage II Unstageable/Unclassified N/A Post Debridement Stage: Debridement Debridement N/A Procedures Performed: Treatment Notes Wound #1 (Calcaneus) Wound Laterality: Right, Medial Cleanser Soap and Water Discharge Instruction: May shower and wash wound with dial antibacterial soap and water prior to dressing change. Peri-Wound Care Skin Prep Discharge Instruction: Use skin prep as directed Topical Primary Dressing Hydrofera Blue Ready Foam, 2.5 x2.5 in Discharge Instruction: Apply to wound bed as instructed Santyl Ointment Discharge Instruction: Apply nickel thick amount to wound bed as instructed Secondary Dressing Woven Gauze Sponge, Non-Sterile 4x4 in Discharge Instruction: Apply over primary dressing as directed. ALLEVYN Heel 4 1/2in x 5 1/2in / 10.5cm x 13.5cm Discharge Instruction: Apply over primary dressing as directed. Secured With [employer], 4.5x3.1 (in/yd) Discharge Instruction: Secure with Kerlix as directed. Paper Tape, 2x10 (in/yd) Discharge Instruction: Secure dressing with tape as directed. Compression Wrap Compression Stockings Add-Ons Wound #2 (Calcaneus) Wound Laterality: Right Cleanser Soap and Water Discharge Instruction: May shower and wash wound with dial antibacterial soap and water prior to dressing change. Peri-Wound Care Skin Prep Discharge Instruction: Use skin prep as directed Topical Primary Dressing Santyl Ointment Discharge Instruction: Apply nickel thick amount to wound bed as instructed Secondary Dressing Woven Gauze Sponge, Non-Sterile 4x4 in Discharge Instruction: Apply over primary dressing as directed. ALLEVYN Heel 4 1/2in x 5 1/2in / 10.5cm x 13.5cm Discharge Instruction: Apply over primary dressing as directed. Secured With [employer], 4.5x3.1 (in/yd) Discharge Instruction: Secure with Kerlix as directed. Paper Tape, 2x10 (in/yd) Discharge Instruction: Secure dressing with tape as directed. Compression Wrap Compression Stockings Add-Ons Electronic Signature(s) Signed: 10/25/2021 10:40:01 AM By: [name] DO Entered By: [name] on 10/25/2021 10:31:13 -------------------------------------------------------------------------------- Multi-Disciplinary Care Plan Details Patient Name: Date of Service: Jane Nurse M. 10/25/2021 9:15 A M Medical Record Number: [id number] Patient Account Number: [id number] Date of Birth/Sex: Treating RN: [birth year] (55 y.o. Jane [name] Primary Care Onnie Alatorre: Jane [name] Other Clinician: Referring Braeden Dolinski: Treating Telly Broberg/Extender: Jane [name] in Treatment: 5 Active Inactive Pressure Nursing Diagnoses: Knowledge deficit related to causes and risk factors for pressure ulcer development Goals: Patient will remain free from development of additional pressure ulcers Date Initiated: 09/19/2021 Target Resolution Date: 11/15/2021 Goal Status: Active Interventions: Assess: immobility, friction, shearing, incontinence upon admission and as needed Assess offloading mechanisms upon admission and as needed Treatment Activities: Pressure reduction/relief device  ordered : 09/19/2021 T ordered outside of clinic : 09/19/2021 est Notes: Wound/Skin Impairment Nursing Diagnoses: Impaired tissue integrity Goals: Patient/caregiver will verbalize understanding of skin care regimen Date Initiated: 09/19/2021 Target Resolution Date: 11/15/2021 Goal Status: Active Ulcer/skin breakdown will have a volume reduction of 30% by week 4 Date Initiated: 09/19/2021 Date Inactivated: 10/25/2021 Target Resolution Date: 10/17/2021 Goal Status: Met Ulcer/skin breakdown will have a volume reduction of 50% by week 8 Date Initiated:  10/25/2021 Target Resolution Date: 11/15/2021 Goal Status: Active Interventions: Assess patient/caregiver ability to obtain necessary supplies Assess patient/caregiver ability to perform ulcer/skin care regimen upon admission and as needed Assess ulceration(s) every visit Provide education on ulcer and skin care Treatment Activities: Topical wound management initiated : 09/19/2021 Notes: Electronic Signature(s) Signed: 10/25/2021 5:28:36 PM By: Baruch Gouty RN, BSN Entered By: Baruch Gouty on 10/25/2021 09:48:20 -------------------------------------------------------------------------------- Pain Assessment Details Patient Name: Date of Service: Jane Nurse M. 10/25/2021 9:15 A M Medical Record Number: [id number] Patient Account Number: [id number] Date of Birth/Sex: Treating RN: [birth year] (56 y.o. Jane [name] Primary Care Trig Mcbryar: Jane [name] Other Clinician: Referring Shanley Furlough: Treating Jeffrie Lofstrom/Extender: Jane [name] in Treatment: 5 Active Problems Location of Pain Severity and Description of Pain Patient Has Paino Yes Site Locations Pain Location: Generalized Pain With Dressing Change: No Duration of the Pain. Constant / Intermittento Constant Rate the pain. Current Pain Level: 7 Worst Pain Level: 10 Least Pain Level: 7 Tolerable Pain Level: 7 Character of Pain Describe the Pain: Aching Pain Management and Medication Current Pain Management: Medication: No Cold Application: No Rest: No Massage: No Activity: No T.E.N.S.: No Heat Application: No Leg drop or elevation: No Is the Current Pain Management Adequate: Adequate How does your wound impact your activities of daily livingo Sleep: No Bathing: No Appetite: No Relationship With Others: No Bladder Continence: No Emotions: No Bowel Continence: No Work: No Toileting: No Drive: No Dressing: No Hobbies: No Electronic Signature(s) Signed: 10/25/2021  5:19:42 PM By: Dellie Catholic RN Entered By: Dellie Catholic on 10/25/2021 09:18:58 -------------------------------------------------------------------------------- Patient/Caregiver Education Details Patient Name: Date of Service: Samule Dry 1/3/2023andnbsp9:15 [place] Record Number: [id number] Patient Account Number: [id number] Date of Birth/Gender: Treating RN: Mar 28, 1966 (56 y.o. Jane [name] Primary Care Physician: Jane [name] Other Clinician: Referring Physician: Treating Physician/Extender: Jane [name] in Treatment: 5 Education Assessment Education Provided To: Patient Education Topics Provided Wound/Skin Impairment: Methods: Explain/Verbal Responses: Reinforcements needed, State content correctly Electronic Signature(s) Signed: 10/25/2021 5:28:36 PM By: Baruch Gouty RN, BSN Entered By: Baruch Gouty on 10/25/2021 09:51:55 -------------------------------------------------------------------------------- Wound Assessment Details Patient Name: Date of Service: Jane Nurse M. 10/25/2021 9:15 A M Medical Record Number: [id number] Patient Account Number: [id number] Date of Birth/Sex: Treating RN: 1966/06/27 (56 y.o. Jane [name] Primary Care Corinthia Helmers: Jane [name] Other Clinician: Referring Luvinia Lucy: Treating Laurian Edrington/Extender: Jane [name] in Treatment: 5 Wound Status Wound Number: 1 Primary Pressure Ulcer Etiology: Wound Location: Right, Medial Calcaneus Wound Open Wounding Event: Pressure Injury Status: Date Acquired: 06/22/2021 Comorbid Cataracts, Anemia, Congestive Heart Failure, Hypotension, End Weeks Of Treatment: 5 History: Stage Renal Disease, Seizure Disorder Clustered Wound: No Photos Wound Measurements Length: (cm) 0.4 Width: (cm) 0.4 Depth: (cm) 0.1 Area: (cm) 0.126 Volume: (cm) 0.013 % Reduction in Area: 83.9% % Reduction in Volume:  94.5% Epithelialization: Small (1-33%) Tunneling: No Undermining: No Wound Description Classification: Category/Stage II Wound Margin: Distinct, outline attached Exudate Amount: Medium Exudate Type: Serosanguineous Exudate Color: red, [name] Foul  Odor After Cleansing: No Slough/Fibrino Yes Wound Bed Granulation Amount: Large (67-100%) Exposed Structure Granulation Quality: Red Fascia Exposed: No Necrotic Amount: None Present (0%) Fat Layer (Subcutaneous Tissue) Exposed: Yes Tendon Exposed: No Muscle Exposed: No Joint Exposed: No Bone Exposed: No Treatment Notes Wound #1 (Calcaneus) Wound Laterality: Right, Medial Cleanser Soap and Water Discharge Instruction: May shower and wash wound with dial antibacterial soap and water prior to dressing change. Peri-Wound Care Skin Prep Discharge Instruction: Use skin prep as directed Topical Primary Dressing Hydrofera Blue Ready Foam, 2.5 x2.5 in Discharge Instruction: Apply to wound bed as instructed Santyl Ointment Discharge Instruction: Apply nickel thick amount to wound bed as instructed Secondary Dressing Woven Gauze Sponge, Non-Sterile 4x4 in Discharge Instruction: Apply over primary dressing as directed. ALLEVYN Heel 4 1/2in x 5 1/2in / 10.5cm x 13.5cm Discharge Instruction: Apply over primary dressing as directed. Secured With [employer], 4.5x3.1 (in/yd) Discharge Instruction: Secure with Kerlix as directed. Paper Tape, 2x10 (in/yd) Discharge Instruction: Secure dressing with tape as directed. Compression Wrap Compression Stockings Add-Ons Electronic Signature(s) Signed: 10/25/2021 5:19:42 PM By: Dellie Catholic RN Entered By: Dellie Catholic on 10/25/2021 09:35:19 -------------------------------------------------------------------------------- Wound Assessment Details Patient Name: Date of Service: Jane [name], VOKES. 10/25/2021 9:15 A M Medical Record Number: [id number] Patient Account Number:  [id number] Date of Birth/Sex: Treating RN: 09-06-1966 (56 y.o. Jane [name] Primary Care Ryker Sudbury: Jane [name] Other Clinician: Referring Rolando Hessling: Treating Shaymus Eveleth/Extender: Jane [name] in Treatment: 5 Wound Status Wound Number: 2 Primary Pressure Ulcer Etiology: Wound Location: Right Calcaneus Wound Open Wounding Event: Pressure Injury Status: Date Acquired: 10/03/2021 Comorbid Cataracts, Anemia, Congestive Heart Failure, Hypotension, End Weeks Of Treatment: 3 History: Stage Renal Disease, Seizure Disorder Clustered Wound: No Photos Wound Measurements Length: (cm) 2 Width: (cm) 1.9 Depth: (cm) 0.1 Area: (cm) 2.985 Volume: (cm) 0.298 % Reduction in Area: 43.4% % Reduction in Volume: 43.6% Epithelialization: None Tunneling: No Undermining: No Wound Description Classification: Unstageable/Unclassified Wound Margin: Distinct, outline attached Exudate Amount: Medium Exudate Type: Serosanguineous Exudate Color: red, [name] Foul Odor After Cleansing: No Slough/Fibrino No Wound Bed Granulation Amount: None Present (0%) Exposed Structure Necrotic Amount: Large (67-100%) Fascia Exposed: No Necrotic Quality: Eschar Fat Layer (Subcutaneous Tissue) Exposed: Yes Tendon Exposed: No Muscle Exposed: No Joint Exposed: No Bone Exposed: No Treatment Notes Wound #2 (Calcaneus) Wound Laterality: Right Cleanser Soap and Water Discharge Instruction: May shower and wash wound with dial antibacterial soap and water prior to dressing change. Peri-Wound Care Skin Prep Discharge Instruction: Use skin prep as directed Topical Primary Dressing Santyl Ointment Discharge Instruction: Apply nickel thick amount to wound bed as instructed Secondary Dressing Woven Gauze Sponge, Non-Sterile 4x4 in Discharge Instruction: Apply over primary dressing as directed. ALLEVYN Heel 4 1/2in x 5 1/2in / 10.5cm x 13.5cm Discharge Instruction: Apply over  primary dressing as directed. Secured With [employer], 4.5x3.1 (in/yd) Discharge Instruction: Secure with Kerlix as directed. Paper Tape, 2x10 (in/yd) Discharge Instruction: Secure dressing with tape as directed. Compression Wrap Compression Stockings Add-Ons Electronic Signature(s) Signed: 10/25/2021 5:19:42 PM By: Dellie Catholic RN Entered By: Dellie Catholic on 10/25/2021 09:37:11 -------------------------------------------------------------------------------- Vitals Details Patient Name: Date of Service: Jane Nurse M. 10/25/2021 9:15 A M Medical Record Number: [id number] Patient Account Number: [id number] Date of Birth/Sex: Treating RN: Jan 25, 1966 (56 y.o. Jane [name] Primary Care Nevaeha Finerty: Jane [name] Other Clinician: Referring Paislea Hatton: Treating Marisella Puccio/Extender: Jane [name] in Treatment: 5 Vital Signs Time Taken: 09:15 Temperature (F): 97./6 Pulse (  bpm): 99 Respiratory Rate (breaths/min): 16 Blood Pressure (mmHg): 145/84 Reference Range: 80 - 120 mg / dl Electronic Signature(s) Signed: 10/25/2021 5:19:42 PM By: Dellie Catholic RN Entered By: Dellie Catholic on 10/25/2021 09:18:10

## 2021-10-26 DIAGNOSIS — N186 End stage renal disease: Secondary | ICD-10-CM | POA: Diagnosis not present

## 2021-10-26 DIAGNOSIS — D631 Anemia in chronic kidney disease: Secondary | ICD-10-CM | POA: Diagnosis not present

## 2021-10-26 DIAGNOSIS — N2581 Secondary hyperparathyroidism of renal origin: Secondary | ICD-10-CM | POA: Diagnosis not present

## 2021-10-27 DIAGNOSIS — N186 End stage renal disease: Secondary | ICD-10-CM | POA: Diagnosis not present

## 2021-10-27 DIAGNOSIS — I959 Hypotension, unspecified: Secondary | ICD-10-CM | POA: Diagnosis not present

## 2021-10-27 DIAGNOSIS — D631 Anemia in chronic kidney disease: Secondary | ICD-10-CM | POA: Diagnosis not present

## 2021-10-27 DIAGNOSIS — N2581 Secondary hyperparathyroidism of renal origin: Secondary | ICD-10-CM | POA: Diagnosis not present

## 2021-10-27 DIAGNOSIS — K652 Spontaneous bacterial peritonitis: Secondary | ICD-10-CM | POA: Diagnosis not present

## 2021-10-27 DIAGNOSIS — R627 Adult failure to thrive: Secondary | ICD-10-CM | POA: Diagnosis not present

## 2021-10-27 DIAGNOSIS — T8571XD Infection and inflammatory reaction due to peritoneal dialysis catheter, subsequent encounter: Secondary | ICD-10-CM | POA: Diagnosis not present

## 2021-10-27 DIAGNOSIS — B9562 Methicillin resistant Staphylococcus aureus infection as the cause of diseases classified elsewhere: Secondary | ICD-10-CM | POA: Diagnosis not present

## 2021-10-27 DIAGNOSIS — L89613 Pressure ulcer of right heel, stage 3: Secondary | ICD-10-CM | POA: Diagnosis not present

## 2021-10-28 DIAGNOSIS — K652 Spontaneous bacterial peritonitis: Secondary | ICD-10-CM | POA: Diagnosis not present

## 2021-10-28 DIAGNOSIS — B9562 Methicillin resistant Staphylococcus aureus infection as the cause of diseases classified elsewhere: Secondary | ICD-10-CM | POA: Diagnosis not present

## 2021-10-28 DIAGNOSIS — T8571XD Infection and inflammatory reaction due to peritoneal dialysis catheter, subsequent encounter: Secondary | ICD-10-CM | POA: Diagnosis not present

## 2021-10-28 DIAGNOSIS — D631 Anemia in chronic kidney disease: Secondary | ICD-10-CM | POA: Diagnosis not present

## 2021-10-28 DIAGNOSIS — N186 End stage renal disease: Secondary | ICD-10-CM | POA: Diagnosis not present

## 2021-10-28 DIAGNOSIS — N2581 Secondary hyperparathyroidism of renal origin: Secondary | ICD-10-CM | POA: Diagnosis not present

## 2021-10-28 DIAGNOSIS — I959 Hypotension, unspecified: Secondary | ICD-10-CM | POA: Diagnosis not present

## 2021-10-28 DIAGNOSIS — R627 Adult failure to thrive: Secondary | ICD-10-CM | POA: Diagnosis not present

## 2021-10-28 DIAGNOSIS — L89613 Pressure ulcer of right heel, stage 3: Secondary | ICD-10-CM | POA: Diagnosis not present

## 2021-10-29 DIAGNOSIS — N2581 Secondary hyperparathyroidism of renal origin: Secondary | ICD-10-CM | POA: Diagnosis not present

## 2021-10-29 DIAGNOSIS — D631 Anemia in chronic kidney disease: Secondary | ICD-10-CM | POA: Diagnosis not present

## 2021-10-29 DIAGNOSIS — N186 End stage renal disease: Secondary | ICD-10-CM | POA: Diagnosis not present

## 2021-10-30 DIAGNOSIS — N2581 Secondary hyperparathyroidism of renal origin: Secondary | ICD-10-CM | POA: Diagnosis not present

## 2021-10-30 DIAGNOSIS — N186 End stage renal disease: Secondary | ICD-10-CM | POA: Diagnosis not present

## 2021-10-30 DIAGNOSIS — D631 Anemia in chronic kidney disease: Secondary | ICD-10-CM | POA: Diagnosis not present

## 2021-10-31 DIAGNOSIS — L89613 Pressure ulcer of right heel, stage 3: Secondary | ICD-10-CM | POA: Diagnosis not present

## 2021-10-31 DIAGNOSIS — T8571XD Infection and inflammatory reaction due to peritoneal dialysis catheter, subsequent encounter: Secondary | ICD-10-CM | POA: Diagnosis not present

## 2021-10-31 DIAGNOSIS — I959 Hypotension, unspecified: Secondary | ICD-10-CM | POA: Diagnosis not present

## 2021-10-31 DIAGNOSIS — B9562 Methicillin resistant Staphylococcus aureus infection as the cause of diseases classified elsewhere: Secondary | ICD-10-CM | POA: Diagnosis not present

## 2021-10-31 DIAGNOSIS — R627 Adult failure to thrive: Secondary | ICD-10-CM | POA: Diagnosis not present

## 2021-10-31 DIAGNOSIS — K652 Spontaneous bacterial peritonitis: Secondary | ICD-10-CM | POA: Diagnosis not present

## 2021-10-31 DIAGNOSIS — N2581 Secondary hyperparathyroidism of renal origin: Secondary | ICD-10-CM | POA: Diagnosis not present

## 2021-10-31 DIAGNOSIS — N186 End stage renal disease: Secondary | ICD-10-CM | POA: Diagnosis not present

## 2021-10-31 DIAGNOSIS — D631 Anemia in chronic kidney disease: Secondary | ICD-10-CM | POA: Diagnosis not present

## 2021-11-01 DIAGNOSIS — N2581 Secondary hyperparathyroidism of renal origin: Secondary | ICD-10-CM | POA: Diagnosis not present

## 2021-11-01 DIAGNOSIS — N186 End stage renal disease: Secondary | ICD-10-CM | POA: Diagnosis not present

## 2021-11-01 DIAGNOSIS — D631 Anemia in chronic kidney disease: Secondary | ICD-10-CM | POA: Diagnosis not present

## 2021-11-02 DIAGNOSIS — L89613 Pressure ulcer of right heel, stage 3: Secondary | ICD-10-CM | POA: Diagnosis not present

## 2021-11-02 DIAGNOSIS — N186 End stage renal disease: Secondary | ICD-10-CM | POA: Diagnosis not present

## 2021-11-02 DIAGNOSIS — B9562 Methicillin resistant Staphylococcus aureus infection as the cause of diseases classified elsewhere: Secondary | ICD-10-CM | POA: Diagnosis not present

## 2021-11-02 DIAGNOSIS — I959 Hypotension, unspecified: Secondary | ICD-10-CM | POA: Diagnosis not present

## 2021-11-02 DIAGNOSIS — T8571XD Infection and inflammatory reaction due to peritoneal dialysis catheter, subsequent encounter: Secondary | ICD-10-CM | POA: Diagnosis not present

## 2021-11-02 DIAGNOSIS — D631 Anemia in chronic kidney disease: Secondary | ICD-10-CM | POA: Diagnosis not present

## 2021-11-02 DIAGNOSIS — N2581 Secondary hyperparathyroidism of renal origin: Secondary | ICD-10-CM | POA: Diagnosis not present

## 2021-11-02 DIAGNOSIS — K652 Spontaneous bacterial peritonitis: Secondary | ICD-10-CM | POA: Diagnosis not present

## 2021-11-02 DIAGNOSIS — R627 Adult failure to thrive: Secondary | ICD-10-CM | POA: Diagnosis not present

## 2021-11-03 DIAGNOSIS — N186 End stage renal disease: Secondary | ICD-10-CM | POA: Diagnosis not present

## 2021-11-03 DIAGNOSIS — N2581 Secondary hyperparathyroidism of renal origin: Secondary | ICD-10-CM | POA: Diagnosis not present

## 2021-11-03 DIAGNOSIS — D631 Anemia in chronic kidney disease: Secondary | ICD-10-CM | POA: Diagnosis not present

## 2021-11-04 DIAGNOSIS — L89613 Pressure ulcer of right heel, stage 3: Secondary | ICD-10-CM | POA: Diagnosis not present

## 2021-11-04 DIAGNOSIS — N186 End stage renal disease: Secondary | ICD-10-CM | POA: Diagnosis not present

## 2021-11-04 DIAGNOSIS — K652 Spontaneous bacterial peritonitis: Secondary | ICD-10-CM | POA: Diagnosis not present

## 2021-11-04 DIAGNOSIS — D631 Anemia in chronic kidney disease: Secondary | ICD-10-CM | POA: Diagnosis not present

## 2021-11-04 DIAGNOSIS — N2581 Secondary hyperparathyroidism of renal origin: Secondary | ICD-10-CM | POA: Diagnosis not present

## 2021-11-04 DIAGNOSIS — B9562 Methicillin resistant Staphylococcus aureus infection as the cause of diseases classified elsewhere: Secondary | ICD-10-CM | POA: Diagnosis not present

## 2021-11-04 DIAGNOSIS — I959 Hypotension, unspecified: Secondary | ICD-10-CM | POA: Diagnosis not present

## 2021-11-04 DIAGNOSIS — T8571XD Infection and inflammatory reaction due to peritoneal dialysis catheter, subsequent encounter: Secondary | ICD-10-CM | POA: Diagnosis not present

## 2021-11-04 DIAGNOSIS — R627 Adult failure to thrive: Secondary | ICD-10-CM | POA: Diagnosis not present

## 2021-11-05 DIAGNOSIS — N186 End stage renal disease: Secondary | ICD-10-CM | POA: Diagnosis not present

## 2021-11-05 DIAGNOSIS — D631 Anemia in chronic kidney disease: Secondary | ICD-10-CM | POA: Diagnosis not present

## 2021-11-05 DIAGNOSIS — N2581 Secondary hyperparathyroidism of renal origin: Secondary | ICD-10-CM | POA: Diagnosis not present

## 2021-11-06 DIAGNOSIS — D631 Anemia in chronic kidney disease: Secondary | ICD-10-CM | POA: Diagnosis not present

## 2021-11-06 DIAGNOSIS — N2581 Secondary hyperparathyroidism of renal origin: Secondary | ICD-10-CM | POA: Diagnosis not present

## 2021-11-06 DIAGNOSIS — N186 End stage renal disease: Secondary | ICD-10-CM | POA: Diagnosis not present

## 2021-11-07 DIAGNOSIS — D631 Anemia in chronic kidney disease: Secondary | ICD-10-CM | POA: Diagnosis not present

## 2021-11-07 DIAGNOSIS — Z1159 Encounter for screening for other viral diseases: Secondary | ICD-10-CM | POA: Diagnosis not present

## 2021-11-07 DIAGNOSIS — N186 End stage renal disease: Secondary | ICD-10-CM | POA: Diagnosis not present

## 2021-11-07 DIAGNOSIS — N2581 Secondary hyperparathyroidism of renal origin: Secondary | ICD-10-CM | POA: Diagnosis not present

## 2021-11-08 ENCOUNTER — Other Ambulatory Visit: Payer: Self-pay

## 2021-11-08 ENCOUNTER — Encounter (HOSPITAL_BASED_OUTPATIENT_CLINIC_OR_DEPARTMENT_OTHER): Payer: Medicare Other | Admitting: Internal Medicine

## 2021-11-08 DIAGNOSIS — Z992 Dependence on renal dialysis: Secondary | ICD-10-CM | POA: Diagnosis not present

## 2021-11-08 DIAGNOSIS — N2581 Secondary hyperparathyroidism of renal origin: Secondary | ICD-10-CM | POA: Diagnosis not present

## 2021-11-08 DIAGNOSIS — D631 Anemia in chronic kidney disease: Secondary | ICD-10-CM | POA: Diagnosis not present

## 2021-11-08 DIAGNOSIS — E039 Hypothyroidism, unspecified: Secondary | ICD-10-CM | POA: Diagnosis not present

## 2021-11-08 DIAGNOSIS — L89613 Pressure ulcer of right heel, stage 3: Secondary | ICD-10-CM | POA: Diagnosis not present

## 2021-11-08 DIAGNOSIS — G40909 Epilepsy, unspecified, not intractable, without status epilepticus: Secondary | ICD-10-CM | POA: Diagnosis not present

## 2021-11-08 DIAGNOSIS — N186 End stage renal disease: Secondary | ICD-10-CM | POA: Diagnosis not present

## 2021-11-08 DIAGNOSIS — F259 Schizoaffective disorder, unspecified: Secondary | ICD-10-CM | POA: Diagnosis not present

## 2021-11-08 NOTE — Progress Notes (Signed)
Jane [name], Jane [name] ([id number]) Visit Report for 11/08/2021 Chief Complaint Document Details Patient Name: Date of Service: Jane [name], Jane [name]. 11/08/2021 9:00 A M Medical Record Number: [id number] Patient Account Number: [id number] Date of Birth/Sex: Treating RN: 01-03-1966 (56 y.o. F) Primary Care Provider: [name] Other Clinician: Referring Provider: Treating Provider/Extender: Carlus Pavlov in Treatment: 7 Information Obtained from: Patient Chief Complaint Right heel wound Electronic Signature(s) Signed: 11/08/2021 9:55:47 AM By: [name] DO Entered By: [name] on 11/08/2021 09:41:42 -------------------------------------------------------------------------------- Debridement Details Patient Name: Date of Service: Jane Nurse M. 11/08/2021 9:00 A M Medical Record Number: [id number] Patient Account Number: [id number] Date of Birth/Sex: Treating RN: May 06, 1966 (56 y.o. [name] Primary Care Provider: [name] Other Clinician: Referring Provider: Treating Provider/Extender: Carlus Pavlov in Treatment: 7 Debridement Performed for Assessment: Wound #2 Right Calcaneus Performed By: Physician [name], DO Debridement Type: Debridement Level of Consciousness (Pre-procedure): Awake and Alert Pre-procedure Verification/Time Out Yes - 09:09 Taken: Start Time: 09:10 Pain Control: Other : benzocaine 20% T Area Debrided (L x W): otal 2.5 (cm) x 1.5 (cm) = 3.75 (cm) Tissue and other material debrided: Non-Viable, Eschar, Subcutaneous Level: Skin/Subcutaneous Tissue Debridement Description: Excisional Instrument: Curette Bleeding: Minimum Hemostasis Achieved: Pressure End Time: 09:14 Response to Treatment: Procedure was tolerated well Level of Consciousness (Post- Awake and Alert procedure): Post Debridement Measurements of Total Wound Length: (cm) 2.5 Stage:  Unstageable/Unclassified Width: (cm) 1.5 Depth: (cm) 0.1 Volume: (cm) 0.295 Character of Wound/Ulcer Post Debridement: Stable Post Procedure Diagnosis Same as Pre-procedure Electronic Signature(s) Signed: 11/08/2021 9:55:47 AM By: [name] DO Signed: 11/08/2021 3:58:04 PM By: Lorrin Jackson Entered By: Lorrin Jackson on 11/08/2021 09:14:44 -------------------------------------------------------------------------------- HPI Details Patient Name: Date of Service: Jane [name], Jane M. 11/08/2021 9:00 A M Medical Record Number: [id number] Patient Account Number: [id number] Date of Birth/Sex: Treating RN: 12-May-1966 (56 y.o. F) Primary Care Provider: [name] Other Clinician: Referring Provider: Treating Provider/Extender: Carlus Pavlov in Treatment: 7 History of Present Illness HPI Description: Admission 09/19/2021 Jane [name] is a 56 year old female with a past medical history of schizoaffective disorder, end-stage renal disease on peritoneal dialysis and seizure disorder that presents to the clinic for a right heel wound that has been present since her hospitalization in April 2022 because of an epidural abscess that required surgery. During her hospitalization she developed the heel wound and a breast wound. She would lean more aggressively to the left side for long periods of time. The breast wound has healed. The right heel Has developed a loose callus. She is not mobile and is either in her wheelchair or in the bed most of the day. She currently denies pain, increased warmth or erythema to the area or drainage. Son-in-law is present and helps with the history. Patient is unable to participate fully in her care due to her diagnosis of schizoaffective disorder. 10/03/2021; patient presents for follow-up. She has developed a new pressure injury to the calcaneus and has skin breakdown to the surrounding area. She has been using  Hydrofera Blue to the previous wound site. She has no issues or complaints today. She denies signs of infection. 12/19; patient presents for follow-up. She has been using Hydrofera Blue to the wound sites. She went back and obtained TBI's. She currently denies signs of infection. 1/3; patient presents for follow-up. She has been using Hydrofera Blue to the medial wound and Santyl to the calcaneus wound. She has no issues  or complaints today. She denies signs of infection. 1/17; patient presents for follow-up. She has been using Hydrofera Blue to the medial wound along with Santyl. She been using Santyl to the calcaneus wound. She has no issues or complaints today. She denies signs of infection. Electronic Signature(s) Signed: 11/08/2021 9:55:47 AM By: [name] DO Entered By: [name] on 11/08/2021 09:42:09 -------------------------------------------------------------------------------- Physical Exam Details Patient Name: Date of Service: Jane Nurse M. 11/08/2021 9:00 A M Medical Record Number: [id number] Patient Account Number: [id number] Date of Birth/Sex: Treating RN: Apr 15, 1966 (56 y.o. F) Primary Care Provider: [name] Other Clinician: Referring Provider: Treating Provider/Extender: Carlus Pavlov in Treatment: 7 Constitutional respirations regular, non-labored and within target range for patient.. Cardiovascular 2+ dorsalis pedis/posterior tibialis pulses. Psychiatric pleasant and cooperative. Notes Right heel: T the medial aspect there is callus and scab over the wound site. Postdebridement there is epithelialization to the previous wound site. Directly to o the posterior aspect there is nonviable tissue and eschar throughout. No signs of surrounding infection. Electronic Signature(s) Signed: 11/08/2021 9:55:47 AM By: [name] DO Entered By: [name] on 11/08/2021  09:52:56 -------------------------------------------------------------------------------- Physician Orders Details Patient Name: Date of Service: Jane Nurse M. 11/08/2021 9:00 A M Medical Record Number: [id number] Patient Account Number: [id number] Date of Birth/Sex: Treating RN: 02/19/1966 (56 y.o. [name] Primary Care Provider: [name] Other Clinician: Referring Provider: Treating Provider/Extender: Carlus Pavlov in Treatment: 7 Verbal / Phone Orders: No Diagnosis Coding ICD-10 Coding Code Description [phone] Pressure ulcer of right heel, stage 3 F25.9 Schizoaffective disorder, unspecified N18.6 End stage renal disease L89.610 Pressure ulcer of right heel, unstageable Follow-up Appointments ppointment in 2 weeks. - with Dr. Heber [place] Return A Bathing/ Shower/ Hygiene May shower and wash wound with soap and water. - when changing dressing Edema Control - Lymphedema / SCD / Other Elevate legs to the level of the heart or above for 30 minutes daily and/or when sitting, a frequency of: Off-Loading Other: - Prevalon Boot to right foot, avoid direct pressure to back of heels Additional Orders / Instructions Follow Nutritious Diet Wound Treatment Wound #2 - Calcaneus Wound Laterality: Right Cleanser: Soap and Water 1 x Per Day/30 Days Discharge Instructions: May shower and wash wound with dial antibacterial soap and water prior to dressing change. Peri-Wound Care: Skin Prep 1 x Per Day/30 Days Discharge Instructions: Use skin prep as directed Prim Dressing: Santyl Ointment 1 x Per Day/30 Days ary Discharge Instructions: Apply nickel thick amount to wound bed as instructed Secondary Dressing: Woven Gauze Sponge, Non-Sterile 4x4 in 1 x Per Day/30 Days Discharge Instructions: Apply over primary dressing as directed. Secondary Dressing: ALLEVYN Heel 4 1/2in x 5 1/2in / 10.5cm x 13.5cm 1 x Per Day/30 Days Discharge Instructions:  Apply over primary dressing as directed. Secured With: [employer], 4.5x3.1 (in/yd) 1 x Per Day/30 Days Discharge Instructions: Secure with Kerlix as directed. Secured With: Paper Tape, 2x10 (in/yd) 1 x Per Day/30 Days Discharge Instructions: Secure dressing with tape as directed. Electronic Signature(s) Signed: 11/08/2021 9:55:47 AM By: [name] DO Entered By: [name] on 11/08/2021 09:53:35 -------------------------------------------------------------------------------- Problem List Details Patient Name: Date of Service: Jane Nurse M. 11/08/2021 9:00 A M Medical Record Number: [id number] Patient Account Number: [id number] Date of Birth/Sex: Treating RN: 1966-10-19 (56 y.o. [name] Primary Care Provider: [name] Other Clinician: Referring Provider: Treating Provider/Extender: Carlus Pavlov in Treatment: 7 Active Problems  ICD-10 Encounter Code Description Active Date MDM Diagnosis L89.613 Pressure ulcer of right heel, stage 3 09/19/2021 No Yes F25.9 Schizoaffective disorder, unspecified 09/19/2021 No Yes N18.6 End stage renal disease 09/19/2021 No Yes L89.610 Pressure ulcer of right heel, unstageable 10/03/2021 No Yes Inactive Problems Resolved Problems Electronic Signature(s) Signed: 11/08/2021 9:55:47 AM By: [name] DO Entered By: [name] on 11/08/2021 09:41:13 -------------------------------------------------------------------------------- Progress Note Details Patient Name: Date of Service: Jane Nurse M. 11/08/2021 9:00 A M Medical Record Number: [id number] Patient Account Number: [id number] Date of Birth/Sex: Treating RN: 1966/07/13 (56 y.o. F) Primary Care Provider: [name] Other Clinician: Referring Provider: Treating Provider/Extender: Carlus Pavlov in Treatment: 7 Subjective Chief Complaint Information obtained from  Patient Right heel wound History of Present Illness (HPI) Admission 09/19/2021 Ms. Jane [name] is a 56 year old female with a past medical history of schizoaffective disorder, end-stage renal disease on peritoneal dialysis and seizure disorder that presents to the clinic for a right heel wound that has been present since her hospitalization in April 2022 because of an epidural abscess that required surgery. During her hospitalization she developed the heel wound and a breast wound. She would lean more aggressively to the left side for long periods of time. The breast wound has healed. The right heel Has developed a loose callus. She is not mobile and is either in her wheelchair or in the bed most of the day. She currently denies pain, increased warmth or erythema to the area or drainage. Son-in-law is present and helps with the history. Patient is unable to participate fully in her care due to her diagnosis of schizoaffective disorder. 10/03/2021; patient presents for follow-up. She has developed a new pressure injury to the calcaneus and has skin breakdown to the surrounding area. She has been using Hydrofera Blue to the previous wound site. She has no issues or complaints today. She denies signs of infection. 12/19; patient presents for follow-up. She has been using Hydrofera Blue to the wound sites. She went back and obtained TBI's. She currently denies signs of infection. 1/3; patient presents for follow-up. She has been using Hydrofera Blue to the medial wound and Santyl to the calcaneus wound. She has no issues or complaints today. She denies signs of infection. 1/17; patient presents for follow-up. She has been using Hydrofera Blue to the medial wound along with Santyl. She been using Santyl to the calcaneus wound. She has no issues or complaints today. She denies signs of infection. Patient History Information obtained from Patient, Caregiver. Family History Cancer - Mother, Kidney  Disease - Maternal Grandparents, No family history of Diabetes, Heart Disease, Hereditary Spherocytosis, Hypertension, Lung Disease, Seizures, Stroke, Thyroid Problems, Tuberculosis. Social History Never smoker, Marital Status - Single, Alcohol Use - Never, Drug Use - No History, Caffeine Use - Rarely. Medical History Eyes Patient has history of Cataracts Hematologic/Lymphatic Patient has history of Anemia Cardiovascular Patient has history of Congestive Heart Failure, Hypotension Denies history of Hypertension Genitourinary Patient has history of End Stage Renal Disease - Dialysis Neurologic Patient has history of Seizure Disorder - last seizure several years ago Medical A Surgical History Notes nd Endocrine Hypothyroidism Musculoskeletal Laminectomy 05/2021 Psychiatric Schizoaffective Disorder Objective Constitutional respirations regular, non-labored and within target range for patient.. Vitals Time Taken: 8:02 AM, Temperature: 98.4 F, Pulse: 130 bpm, Respiratory Rate: 18 breaths/min, Blood Pressure: 131/91 mmHg. Cardiovascular 2+ dorsalis pedis/posterior tibialis pulses. Psychiatric pleasant and cooperative. General Notes: Right heel: T the medial aspect there is callus and  scab over the wound site. Postdebridement there is epithelialization to the previous wound o site. Directly to the posterior aspect there is nonviable tissue and eschar throughout. No signs of surrounding infection. Integumentary (Hair, Skin) Wound #1 status is Healed - Epithelialized. Original cause of wound was Pressure Injury. The date acquired was: 06/22/2021. The wound has been in treatment 7 weeks. The wound is located on the Right,Medial Calcaneus. The wound measures 0cm length x 0cm width x 0cm depth; 0cm^2 area and 0cm^3 volume. Wound #2 status is Open. Original cause of wound was Pressure Injury. The date acquired was: 10/03/2021. The wound has been in treatment 5 weeks. The wound is located on  the Right Calcaneus. The wound measures 2.5cm length x 1.5cm width x 0.1cm depth; 2.945cm^2 area and 0.295cm^3 volume. There is Fat Layer (Subcutaneous Tissue) exposed. There is no tunneling or undermining noted. There is a medium amount of serosanguineous drainage noted. The wound margin is distinct with the outline attached to the wound base. There is small (1-33%) red granulation within the wound bed. There is a large (67-100%) amount of necrotic tissue within the wound bed including Eschar. Assessment Active Problems ICD-10 Pressure ulcer of right heel, stage 3 Schizoaffective disorder, unspecified End stage renal disease Pressure ulcer of right heel, unstageable Patient's right medial heel wound has healed. I debrided nonviable tissue to the remaining wound. No signs of surrounding infection. I recommended aggressive offloading and Santyl daily. Follow-up in 2 weeks. Procedures Wound #2 Pre-procedure diagnosis of Wound #2 is a Pressure Ulcer located on the Right Calcaneus . There was a Excisional Skin/Subcutaneous Tissue Debridement with a total area of 3.75 sq cm performed by [name], DO. With the following instrument(s): Curette to remove Non-Viable tissue/material. Material removed includes Eschar and Subcutaneous Tissue and after achieving pain control using Other (benzocaine 20%). No specimens were taken. A time out was conducted at 09:09, prior to the start of the procedure. A Minimum amount of bleeding was controlled with Pressure. The procedure was tolerated well. Post Debridement Measurements: 2.5cm length x 1.5cm width x 0.1cm depth; 0.295cm^3 volume. Post debridement Stage noted as Unstageable/Unclassified. Character of Wound/Ulcer Post Debridement is stable. Post procedure Diagnosis Wound #2: Same as Pre-Procedure Plan Follow-up Appointments: Return Appointment in 2 weeks. - with Dr. Heber [place] Bathing/ Shower/ Hygiene: May shower and wash wound with soap and water. -  when changing dressing Edema Control - Lymphedema / SCD / Other: Elevate legs to the level of the heart or above for 30 minutes daily and/or when sitting, a frequency of: Off-Loading: Other: - Prevalon Boot to right foot, avoid direct pressure to back of heels Additional Orders / Instructions: Follow Nutritious Diet WOUND #2: - Calcaneus Wound Laterality: Right Cleanser: Soap and Water 1 x Per Day/30 Days Discharge Instructions: May shower and wash wound with dial antibacterial soap and water prior to dressing change. Peri-Wound Care: Skin Prep 1 x Per Day/30 Days Discharge Instructions: Use skin prep as directed Prim Dressing: Santyl Ointment 1 x Per Day/30 Days ary Discharge Instructions: Apply nickel thick amount to wound bed as instructed Secondary Dressing: Woven Gauze Sponge, Non-Sterile 4x4 in 1 x Per Day/30 Days Discharge Instructions: Apply over primary dressing as directed. Secondary Dressing: ALLEVYN Heel 4 1/2in x 5 1/2in / 10.5cm x 13.5cm 1 x Per Day/30 Days Discharge Instructions: Apply over primary dressing as directed. Secured With: [employer], 4.5x3.1 (in/yd) 1 x Per Day/30 Days Discharge Instructions: Secure with Kerlix as directed. Secured With: Paper T  ape, 2x10 (in/yd) 1 x Per Day/30 Days Discharge Instructions: Secure dressing with tape as directed. 1. Santyl daily 2. Aggressive offloading 3. Follow-up in 2 weeks Electronic Signature(s) Signed: 11/08/2021 9:55:47 AM By: [name] DO Entered By: [name] on 11/08/2021 09:54:58 -------------------------------------------------------------------------------- HxROS Details Patient Name: Date of Service: Jane [name], Jane M. 11/08/2021 9:00 A M Medical Record Number: [id number] Patient Account Number: [id number] Date of Birth/Sex: Treating RN: [birth year] (56 y.o. F) Primary Care Provider: [name] Other Clinician: Referring Provider: Treating Provider/Extender: Carlus Pavlov in Treatment: 7 Information Obtained From Patient Caregiver Eyes Medical History: Positive for: Cataracts Hematologic/Lymphatic Medical History: Positive for: Anemia Cardiovascular Medical History: Positive for: Congestive Heart Failure; Hypotension Negative for: Hypertension Endocrine Medical History: Past Medical History Notes: Hypothyroidism Genitourinary Medical History: Positive for: End Stage Renal Disease - Dialysis Musculoskeletal Medical History: Past Medical History Notes: Laminectomy 05/2021 Neurologic Medical History: Positive for: Seizure Disorder - last seizure several years ago Psychiatric Medical History: Past Medical History Notes: Schizoaffective Disorder HBO Extended History Items Eyes: Cataracts Immunizations Pneumococcal Vaccine: Received Pneumococcal Vaccination: No Implantable Devices Yes Family and Social History Cancer: Yes - Mother; Diabetes: No; Heart Disease: No; Hereditary Spherocytosis: No; Hypertension: No; Kidney Disease: Yes - Maternal Grandparents; Lung Disease: No; Seizures: No; Stroke: No; Thyroid Problems: No; Tuberculosis: No; Never smoker; Marital Status - Single; Alcohol Use: Never; Drug Use: No History; Caffeine Use: Rarely; Financial Concerns: No; Food, Clothing or Shelter Needs: No; Support System Lacking: No; Transportation Concerns: No Electronic Signature(s) Signed: 11/08/2021 9:55:47 AM By: [name] DO Entered By: [name] on 11/08/2021 09:42:17 -------------------------------------------------------------------------------- SuperBill Details Patient Name: Date of Service: Jane Nurse M. 11/08/2021 Medical Record Number: [id number] Patient Account Number: [id number] Date of Birth/Sex: Treating RN: Feb 25, 1966 (56 y.o. [name] Primary Care Provider: [name] Other Clinician: Referring Provider: Treating Provider/Extender: Carlus Pavlov in Treatment: 7 Diagnosis Coding ICD-10 Codes Code Description [phone] Pressure ulcer of right heel, stage 3 F25.9 Schizoaffective disorder, unspecified N18.6 End stage renal disease L89.610 Pressure ulcer of right heel, unstageable Facility Procedures CPT4 Code: [id number] Description: 86761 - DEB SUBQ TISSUE 20 SQ CM/< ICD-10 Diagnosis Description L89.613 Pressure ulcer of right heel, stage 3 Modifier: Quantity: 1 Physician Procedures : CPT4 Code Description Modifier [id number] - WC PHYS SUBQ TISS 20 SQ CM ICD-10 Diagnosis Description L89.613 Pressure ulcer of right heel, stage 3 Quantity: 1 Electronic Signature(s) Signed: 11/08/2021 9:55:47 AM By: [name] DO Entered By: [name] on 11/08/2021 09:55:23

## 2021-11-08 NOTE — Progress Notes (Signed)
Jane, [name] ([id number]) Visit Report for 11/08/2021 Arrival Information Details Patient Name: Date of Service: Jane [name], Jane [name]. 11/08/2021 9:00 A M Medical Record Number: [id number] Patient Account Number: [id number] Date of Birth/Sex: Treating RN: 11/29/1965 (56 y.o. [name] Primary Care Felipe Paluch: [name] Other Clinician: Referring Jhaniya Briski: Treating Amaria Mundorf/Extender: Carlus Pavlov in Treatment: 7 Visit Information History Since Last Visit Added or deleted any medications: No Patient Arrived: Wheel Chair Any new allergies or adverse reactions: No Arrival Time: 07:57 Had a fall or experienced change in No Accompanied By: brother in law activities of daily living that may affect Transfer Assistance: Manual risk of falls: Patient Identification Verified: Yes Signs or symptoms of abuse/neglect since last visito No Secondary Verification Process Completed: Yes Hospitalized since last visit: No Patient Requires Transmission-Based Precautions: No Implantable device outside of the clinic excluding No Patient Has Alerts: Yes cellular tissue based products placed in the center Patient Alerts: R ABI=Non Comp since last visit: Has Dressing in Place as Prescribed: Yes Pain Present Now: No Electronic Signature(s) Signed: 11/08/2021 3:58:04 PM By: Lorrin Jackson Entered By: Lorrin Jackson on 11/08/2021 08:02:04 -------------------------------------------------------------------------------- Encounter Discharge Information Details Patient Name: Date of Service: Jane Nurse M. 11/08/2021 9:00 A M Medical Record Number: [id number] Patient Account Number: [id number] Date of Birth/Sex: Treating RN: 1965/11/26 (56 y.o. [name] Primary Care Kenyon Eshleman: [name] Other Clinician: Referring Gurbani Figge: Treating Sussan Meter/Extender: Carlus Pavlov in Treatment: 7 Encounter Discharge  Information Items Post Procedure Vitals Discharge Condition: Stable Temperature (F): 98.4 Ambulatory Status: Wheelchair Pulse (bpm): 130 Discharge Destination: Home Respiratory Rate (breaths/min): 18 Transportation: Private Auto Blood Pressure (mmHg): 131/91 Accompanied By: Brother in Law Schedule Follow-up Appointment: Yes Clinical Summary of Care: Provided on 11/08/2021 Form Type Recipient Paper Patient Patient Electronic Signature(s) Signed: 11/08/2021 9:36:37 AM By: Lorrin Jackson Entered By: Lorrin Jackson on 11/08/2021 09:36:37 -------------------------------------------------------------------------------- Lower Extremity Assessment Details Patient Name: Date of Service: Jane Nurse M. 11/08/2021 9:00 A M Medical Record Number: [id number] Patient Account Number: [id number] Date of Birth/Sex: Treating RN: 1966/07/10 (56 y.o. [name] Primary Care Cedrick Partain: [name] Other Clinician: Referring Edmar Blankenburg: Treating Flem Enderle/Extender: Carlus Pavlov in Treatment: 7 Edema Assessment Assessed: [name]: No] [Right: No] Edema: [Left: Ye] [Right: s] Calf Left: Right: Point of Measurement: 30 cm From Medial Instep 35.5 cm Ankle Left: Right: Point of Measurement: 7 cm From Medial Instep 22.5 cm Vascular Assessment Pulses: Dorsalis Pedis Palpable: [Right:Yes] Electronic Signature(s) Signed: 11/08/2021 3:58:04 PM By: Lorrin Jackson Entered By: Lorrin Jackson on 11/08/2021 08:13:49 -------------------------------------------------------------------------------- Multi Wound Chart Details Patient Name: Date of Service: Jane Nurse M. 11/08/2021 9:00 A M Medical Record Number: [id number] Patient Account Number: [id number] Date of Birth/Sex: Treating RN: 09-03-1966 (56 y.o. F) Primary Care Zekiah Caruth: [name] Other Clinician: Referring Tyanne Derocher: Treating Lennon Richins/Extender: Carlus Pavlov in Treatment: 7 Vital Signs Height(in): Pulse(bpm): 130 Weight(lbs): Blood Pressure(mmHg): 131/91 Body Mass Index(BMI): Temperature(F): 98.4 Respiratory Rate(breaths/min): 18 Photos: [1:Right, Medial Calcaneus] [2:Right Calcaneus] [N/A:N/A N/A] Wound Location: [1:Pressure Injury] [2:Pressure Injury] [N/A:N/A] Wounding Event: [1:Pressure Ulcer] [2:Pressure Ulcer] [N/A:N/A] Primary Etiology: [1:Cataracts, Anemia, Congestive Heart Cataracts, Anemia, Congestive Heart N/A] Comorbid History: [1:Failure, Hypotension, End Stage Renal Failure, Hypotension, End Stage Renal Disease, Seizure Disorder 06/22/2021] [2:Disease, Seizure Disorder 10/03/2021] [N/A:N/A] Date Acquired: [1:7] [2:5] [N/A:N/A] Weeks of Treatment: [1:Healed - Epithelialized] [2:Open] [N/A:N/A] Wound Status: [1:0x0x0] [2:2.5x1.5x0.1] [N/A:N/A] Measurements L x W x D (cm) [1:0] [2:2.945] [N/A:N/A]  A (cm) : rea [1:0] [2:0.295] [N/A:N/A] Volume (cm) : [1:100.00%] [2:44.20%] [N/A:N/A] % Reduction in A [1:rea: 100.00%] [2:44.10%] [N/A:N/A] % Reduction in Volume: [1:Category/Stage II] [2:Unstageable/Unclassified] [N/A:N/A] Classification: [1:N/A] [2:Medium] [N/A:N/A] Exudate A mount: [1:N/A] [2:Serosanguineous] [N/A:N/A] Exudate Type: [1:N/A] [2:red, brown] [N/A:N/A] Exudate Color: [1:N/A] [2:Distinct, outline attached] [N/A:N/A] Wound Margin: [1:N/A] [2:Small (1-33%)] [N/A:N/A] Granulation A mount: [1:N/A] [2:Red] [N/A:N/A] Granulation Quality: [1:N/A] [2:Large (67-100%)] [N/A:N/A] Necrotic A mount: [1:N/A] [2:Eschar] [N/A:N/A] Necrotic Tissue: [1:N/A] [2:Small (1-33%)] [N/A:N/A] Epithelialization: [1:N/A] [2:Debridement - Excisional] [N/A:N/A] Debridement: Pre-procedure Verification/Time Out N/A [2:09:09] [N/A:N/A] Taken: [1:N/A] [2:Other] [N/A:N/A] Pain Control: [1:N/A] [2:Necrotic/Eschar, Subcutaneous] [N/A:N/A] Tissue Debrided: [1:N/A] [2:Skin/Subcutaneous Tissue] [N/A:N/A] Level: [1:N/A] [2:3.75]  [N/A:N/A] Debridement A (sq cm): [1:rea N/A] [2:Curette] [N/A:N/A] Instrument: [1:N/A] [2:Minimum] [N/A:N/A] Bleeding: [1:N/A] [2:Pressure] [N/A:N/A] Hemostasis A chieved: [1:N/A] [2:Procedure was tolerated well] [N/A:N/A] Debridement Treatment Response: [1:N/A] [2:2.5x1.5x0.1] [N/A:N/A] Post Debridement Measurements L x W x D (cm) [1:N/A] [2:0.295] [N/A:N/A] Post Debridement Volume: (cm) [1:N/A] [2:Unstageable/Unclassified] [N/A:N/A] Post Debridement Stage: [1:N/A] [2:Debridement] [N/A:N/A] Treatment Notes Wound #2 (Calcaneus) Wound Laterality: Right Cleanser Soap and Water Discharge Instruction: May shower and wash wound with dial antibacterial soap and water prior to dressing change. Peri-Wound Care Skin Prep Discharge Instruction: Use skin prep as directed Topical Primary Dressing Santyl Ointment Discharge Instruction: Apply nickel thick amount to wound bed as instructed Secondary Dressing Woven Gauze Sponge, Non-Sterile 4x4 in Discharge Instruction: Apply over primary dressing as directed. ALLEVYN Heel 4 1/2in x 5 1/2in / 10.5cm x 13.5cm Discharge Instruction: Apply over primary dressing as directed. Secured With [employer], 4.5x3.1 (in/yd) Discharge Instruction: Secure with Kerlix as directed. Paper Tape, 2x10 (in/yd) Discharge Instruction: Secure dressing with tape as directed. Compression Wrap Compression Stockings Add-Ons Electronic Signature(s) Signed: 11/08/2021 9:55:47 AM By: [name] DO Entered By: [name] on 11/08/2021 09:41:21 -------------------------------------------------------------------------------- Multi-Disciplinary Care Plan Details Patient Name: Date of Service: Jane Nurse M. 11/08/2021 9:00 A M Medical Record Number: [id number] Patient Account Number: [id number] Date of Birth/Sex: Treating RN: 10-22-1966 (56 y.o. [name] Primary Care Lynlee Stratton: [name] Other Clinician: Referring  Arcenia Scarbro: Treating Kaycee Haycraft/Extender: Carlus Pavlov in Treatment: 7 Active Inactive Pressure Nursing Diagnoses: Knowledge deficit related to causes and risk factors for pressure ulcer development Goals: Patient will remain free from development of additional pressure ulcers Date Initiated: 09/19/2021 Target Resolution Date: 11/15/2021 Goal Status: Active Interventions: Assess: immobility, friction, shearing, incontinence upon admission and as needed Assess offloading mechanisms upon admission and as needed Treatment Activities: Pressure reduction/relief device ordered : 09/19/2021 T ordered outside of clinic : 09/19/2021 est Notes: Wound/Skin Impairment Nursing Diagnoses: Impaired tissue integrity Goals: Patient/caregiver will verbalize understanding of skin care regimen Date Initiated: 09/19/2021 Target Resolution Date: 11/15/2021 Goal Status: Active Ulcer/skin breakdown will have a volume reduction of 30% by week 4 Date Initiated: 09/19/2021 Date Inactivated: 10/25/2021 Target Resolution Date: 10/17/2021 Goal Status: Met Ulcer/skin breakdown will have a volume reduction of 50% by week 8 Date Initiated: 10/25/2021 Target Resolution Date: 11/15/2021 Goal Status: Active Interventions: Assess patient/caregiver ability to obtain necessary supplies Assess patient/caregiver ability to perform ulcer/skin care regimen upon admission and as needed Assess ulceration(s) every visit Provide education on ulcer and skin care Treatment Activities: Topical wound management initiated : 09/19/2021 Notes: Electronic Signature(s) Signed: 11/08/2021 3:58:04 PM By: Lorrin Jackson Entered By: Lorrin Jackson on 11/08/2021 07:57:10 -------------------------------------------------------------------------------- Pain Assessment Details Patient Name: Date of Service: Jane Nurse M. 11/08/2021 9:00 [place] Record Number: [id number] Patient Account Number:  [id number]  Date of Birth/Sex: Treating RN: [birth year] (56 y.o. [name] Primary Care : [name] Other Clinician: Referring : Treating /Extender: Carlus Pavlov in Treatment: 7 Active Problems Location of Pain Severity and Description of Pain Patient Has Paino No Site Locations Pain Management and Medication Current Pain Management: Electronic Signature(s) Signed: 11/08/2021 3:58:04 PM By: Lorrin Jackson Entered By: Lorrin Jackson on 11/08/2021 08:03:41 -------------------------------------------------------------------------------- Patient/Caregiver Education Details Patient Name: Date of Service: Samule Dry. 1/17/2023andnbsp9:00 [place] Record Number: [id number] Patient Account Number: [id number] Date of Birth/Gender: Treating RN: 22-Mar-1966 (56 y.o. [name] Primary Care Physician: [name] Other Clinician: Referring Physician: Treating Physician/Extender: Carlus Pavlov in Treatment: 7 Education Assessment Education Provided To: Caregiver Education Topics Provided Offloading: Methods: Explain/Verbal, Printed Responses: State content correctly Wound/Skin Impairment: Methods: Explain/Verbal, Printed Responses: State content correctly Electronic Signature(s) Signed: 11/08/2021 3:58:04 PM By: Lorrin Jackson Entered By: Lorrin Jackson on 11/08/2021 07:57:27 -------------------------------------------------------------------------------- Wound Assessment Details Patient Name: Date of Service: Jane Nurse M. 11/08/2021 9:00 A M Medical Record Number: [id number] Patient Account Number: [id number] Date of Birth/Sex: Treating RN: June 10, 1966 (56 y.o. [name] Primary Care : [name] Other Clinician: Referring : Treating /Extender: Carlus Pavlov in Treatment: 7 Wound  Status Wound Number: 1 Primary Pressure Ulcer Etiology: Wound Location: Right, Medial Calcaneus Wound Healed - Epithelialized Wounding Event: Pressure Injury Status: Date Acquired: 06/22/2021 Comorbid Cataracts, Anemia, Congestive Heart Failure, Hypotension, End Weeks Of Treatment: 7 History: Stage Renal Disease, Seizure Disorder Clustered Wound: No Photos Wound Measurements Length: (cm) Width: (cm) Depth: (cm) Area: (cm) Volume: (cm) 0 % Reduction in Area: 100% 0 % Reduction in Volume: 100% 0 0 0 Wound Description Classification: Category/Stage II Electronic Signature(s) Signed: 11/08/2021 3:58:04 PM By: Lorrin Jackson Entered By: Lorrin Jackson on 11/08/2021 09:12:14 -------------------------------------------------------------------------------- Wound Assessment Details Patient Name: Date of Service: Jane Nurse M. 11/08/2021 9:00 A M Medical Record Number: [id number] Patient Account Number: [id number] Date of Birth/Sex: Treating RN: 1966/01/27 (56 y.o. [name] Primary Care : [name] Other Clinician: Referring : Treating /Extender: Carlus Pavlov in Treatment: 7 Wound Status Wound Number: 2 Primary Pressure Ulcer Etiology: Wound Location: Right Calcaneus Wound Open Wounding Event: Pressure Injury Status: Date Acquired: 10/03/2021 Comorbid Cataracts, Anemia, Congestive Heart Failure, Hypotension, End Weeks Of Treatment: 5 History: Stage Renal Disease, Seizure Disorder Clustered Wound: No Photos Wound Measurements Length: (cm) 2.5 Width: (cm) 1.5 Depth: (cm) 0.1 Area: (cm) 2.945 Volume: (cm) 0.295 % Reduction in Area: 44.2% % Reduction in Volume: 44.1% Epithelialization: Small (1-33%) Tunneling: No Undermining: No Wound Description Classification: Unstageable/Unclassified Wound Margin: Distinct, outline attached Exudate Amount: Medium Exudate Type:  Serosanguineous Exudate Color: red, brown Foul Odor After Cleansing: No Slough/Fibrino No Wound Bed Granulation Amount: Small (1-33%) Exposed Structure Granulation Quality: Red Fascia Exposed: No Necrotic Amount: Large (67-100%) Fat Layer (Subcutaneous Tissue) Exposed: Yes Necrotic Quality: Eschar Tendon Exposed: No Muscle Exposed: No Joint Exposed: No Bone Exposed: No Treatment Notes Wound #2 (Calcaneus) Wound Laterality: Right Cleanser Soap and Water Discharge Instruction: May shower and wash wound with dial antibacterial soap and water prior to dressing change. Peri-Wound Care Skin Prep Discharge Instruction: Use skin prep as directed Topical Primary Dressing Santyl Ointment Discharge Instruction: Apply nickel thick amount to wound bed as instructed Secondary Dressing Woven Gauze Sponge, Non-Sterile 4x4 in Discharge Instruction: Apply over primary dressing as directed. ALLEVYN Heel 4 1/2in x 5 1/2in /  10.5cm x 13.5cm Discharge Instruction: Apply over primary dressing as directed. Secured With [employer], 4.5x3.1 (in/yd) Discharge Instruction: Secure with Kerlix as directed. Paper Tape, 2x10 (in/yd) Discharge Instruction: Secure dressing with tape as directed. Compression Wrap Compression Stockings Add-Ons Electronic Signature(s) Signed: 11/08/2021 3:58:04 PM By: Lorrin Jackson Entered By: Lorrin Jackson on 11/08/2021 08:13:05 -------------------------------------------------------------------------------- Vitals Details Patient Name: Date of Service: Jane Nurse M. 11/08/2021 9:00 A M Medical Record Number: [id number] Patient Account Number: [id number] Date of Birth/Sex: Treating RN: 07/03/1966 (57 y.o. [name] Primary Care Lilyonna Steidle: [name] Other Clinician: Referring Arwen Haseley: Treating Cortnee Steinmiller/Extender: Carlus Pavlov in Treatment: 7 Vital Signs Time Taken: 08:02 Temperature (F): 98.4 Pulse  (bpm): 130 Respiratory Rate (breaths/min): 18 Blood Pressure (mmHg): 131/91 Reference Range: 80 - 120 mg / dl Electronic Signature(s) Signed: 11/08/2021 3:58:04 PM By: Lorrin Jackson Entered By: Lorrin Jackson on 11/08/2021 08:02:55

## 2021-11-09 DIAGNOSIS — K652 Spontaneous bacterial peritonitis: Secondary | ICD-10-CM | POA: Diagnosis not present

## 2021-11-09 DIAGNOSIS — R627 Adult failure to thrive: Secondary | ICD-10-CM | POA: Diagnosis not present

## 2021-11-09 DIAGNOSIS — N2581 Secondary hyperparathyroidism of renal origin: Secondary | ICD-10-CM | POA: Diagnosis not present

## 2021-11-09 DIAGNOSIS — L89613 Pressure ulcer of right heel, stage 3: Secondary | ICD-10-CM | POA: Diagnosis not present

## 2021-11-09 DIAGNOSIS — I959 Hypotension, unspecified: Secondary | ICD-10-CM | POA: Diagnosis not present

## 2021-11-09 DIAGNOSIS — D631 Anemia in chronic kidney disease: Secondary | ICD-10-CM | POA: Diagnosis not present

## 2021-11-09 DIAGNOSIS — T8571XD Infection and inflammatory reaction due to peritoneal dialysis catheter, subsequent encounter: Secondary | ICD-10-CM | POA: Diagnosis not present

## 2021-11-09 DIAGNOSIS — B9562 Methicillin resistant Staphylococcus aureus infection as the cause of diseases classified elsewhere: Secondary | ICD-10-CM | POA: Diagnosis not present

## 2021-11-09 DIAGNOSIS — N186 End stage renal disease: Secondary | ICD-10-CM | POA: Diagnosis not present

## 2021-11-10 DIAGNOSIS — F250 Schizoaffective disorder, bipolar type: Secondary | ICD-10-CM | POA: Diagnosis not present

## 2021-11-10 DIAGNOSIS — L89613 Pressure ulcer of right heel, stage 3: Secondary | ICD-10-CM | POA: Diagnosis not present

## 2021-11-10 DIAGNOSIS — K652 Spontaneous bacterial peritonitis: Secondary | ICD-10-CM | POA: Diagnosis not present

## 2021-11-10 DIAGNOSIS — F29 Unspecified psychosis not due to a substance or known physiological condition: Secondary | ICD-10-CM | POA: Diagnosis not present

## 2021-11-10 DIAGNOSIS — T8571XD Infection and inflammatory reaction due to peritoneal dialysis catheter, subsequent encounter: Secondary | ICD-10-CM | POA: Diagnosis not present

## 2021-11-10 DIAGNOSIS — N186 End stage renal disease: Secondary | ICD-10-CM | POA: Diagnosis not present

## 2021-11-10 DIAGNOSIS — B9562 Methicillin resistant Staphylococcus aureus infection as the cause of diseases classified elsewhere: Secondary | ICD-10-CM | POA: Diagnosis not present

## 2021-11-10 DIAGNOSIS — I959 Hypotension, unspecified: Secondary | ICD-10-CM | POA: Diagnosis not present

## 2021-11-10 DIAGNOSIS — D631 Anemia in chronic kidney disease: Secondary | ICD-10-CM | POA: Diagnosis not present

## 2021-11-10 DIAGNOSIS — F71 Moderate intellectual disabilities: Secondary | ICD-10-CM | POA: Diagnosis not present

## 2021-11-10 DIAGNOSIS — R627 Adult failure to thrive: Secondary | ICD-10-CM | POA: Diagnosis not present

## 2021-11-10 DIAGNOSIS — F319 Bipolar disorder, unspecified: Secondary | ICD-10-CM | POA: Diagnosis not present

## 2021-11-10 DIAGNOSIS — N2581 Secondary hyperparathyroidism of renal origin: Secondary | ICD-10-CM | POA: Diagnosis not present

## 2021-11-11 DIAGNOSIS — N2581 Secondary hyperparathyroidism of renal origin: Secondary | ICD-10-CM | POA: Diagnosis not present

## 2021-11-11 DIAGNOSIS — N186 End stage renal disease: Secondary | ICD-10-CM | POA: Diagnosis not present

## 2021-11-11 DIAGNOSIS — D631 Anemia in chronic kidney disease: Secondary | ICD-10-CM | POA: Diagnosis not present

## 2021-11-12 DIAGNOSIS — N186 End stage renal disease: Secondary | ICD-10-CM | POA: Diagnosis not present

## 2021-11-12 DIAGNOSIS — N2581 Secondary hyperparathyroidism of renal origin: Secondary | ICD-10-CM | POA: Diagnosis not present

## 2021-11-12 DIAGNOSIS — D631 Anemia in chronic kidney disease: Secondary | ICD-10-CM | POA: Diagnosis not present

## 2021-11-13 DIAGNOSIS — N186 End stage renal disease: Secondary | ICD-10-CM | POA: Diagnosis not present

## 2021-11-13 DIAGNOSIS — I959 Hypotension, unspecified: Secondary | ICD-10-CM | POA: Diagnosis not present

## 2021-11-13 DIAGNOSIS — I5020 Unspecified systolic (congestive) heart failure: Secondary | ICD-10-CM | POA: Diagnosis not present

## 2021-11-13 DIAGNOSIS — Z992 Dependence on renal dialysis: Secondary | ICD-10-CM | POA: Diagnosis not present

## 2021-11-13 DIAGNOSIS — Z8701 Personal history of pneumonia (recurrent): Secondary | ICD-10-CM | POA: Diagnosis not present

## 2021-11-13 DIAGNOSIS — L89613 Pressure ulcer of right heel, stage 3: Secondary | ICD-10-CM | POA: Diagnosis not present

## 2021-11-13 DIAGNOSIS — Z993 Dependence on wheelchair: Secondary | ICD-10-CM | POA: Diagnosis not present

## 2021-11-13 DIAGNOSIS — G40909 Epilepsy, unspecified, not intractable, without status epilepticus: Secondary | ICD-10-CM | POA: Diagnosis not present

## 2021-11-13 DIAGNOSIS — K8010 Calculus of gallbladder with chronic cholecystitis without obstruction: Secondary | ICD-10-CM | POA: Diagnosis not present

## 2021-11-13 DIAGNOSIS — Z9181 History of falling: Secondary | ICD-10-CM | POA: Diagnosis not present

## 2021-11-13 DIAGNOSIS — F259 Schizoaffective disorder, unspecified: Secondary | ICD-10-CM | POA: Diagnosis not present

## 2021-11-13 DIAGNOSIS — N2581 Secondary hyperparathyroidism of renal origin: Secondary | ICD-10-CM | POA: Diagnosis not present

## 2021-11-13 DIAGNOSIS — R188 Other ascites: Secondary | ICD-10-CM | POA: Diagnosis not present

## 2021-11-13 DIAGNOSIS — R627 Adult failure to thrive: Secondary | ICD-10-CM | POA: Diagnosis not present

## 2021-11-13 DIAGNOSIS — E440 Moderate protein-calorie malnutrition: Secondary | ICD-10-CM | POA: Diagnosis not present

## 2021-11-13 DIAGNOSIS — I700 Atherosclerosis of aorta: Secondary | ICD-10-CM | POA: Diagnosis not present

## 2021-11-13 DIAGNOSIS — D631 Anemia in chronic kidney disease: Secondary | ICD-10-CM | POA: Diagnosis not present

## 2021-11-14 DIAGNOSIS — R627 Adult failure to thrive: Secondary | ICD-10-CM | POA: Diagnosis not present

## 2021-11-14 DIAGNOSIS — I5020 Unspecified systolic (congestive) heart failure: Secondary | ICD-10-CM | POA: Diagnosis not present

## 2021-11-14 DIAGNOSIS — I959 Hypotension, unspecified: Secondary | ICD-10-CM | POA: Diagnosis not present

## 2021-11-14 DIAGNOSIS — N186 End stage renal disease: Secondary | ICD-10-CM | POA: Diagnosis not present

## 2021-11-14 DIAGNOSIS — D631 Anemia in chronic kidney disease: Secondary | ICD-10-CM | POA: Diagnosis not present

## 2021-11-14 DIAGNOSIS — N2581 Secondary hyperparathyroidism of renal origin: Secondary | ICD-10-CM | POA: Diagnosis not present

## 2021-11-14 DIAGNOSIS — L89613 Pressure ulcer of right heel, stage 3: Secondary | ICD-10-CM | POA: Diagnosis not present

## 2021-11-15 DIAGNOSIS — D631 Anemia in chronic kidney disease: Secondary | ICD-10-CM | POA: Diagnosis not present

## 2021-11-15 DIAGNOSIS — N186 End stage renal disease: Secondary | ICD-10-CM | POA: Diagnosis not present

## 2021-11-15 DIAGNOSIS — N2581 Secondary hyperparathyroidism of renal origin: Secondary | ICD-10-CM | POA: Diagnosis not present

## 2021-11-16 DIAGNOSIS — M4626 Osteomyelitis of vertebra, lumbar region: Secondary | ICD-10-CM | POA: Diagnosis not present

## 2021-11-16 DIAGNOSIS — N2581 Secondary hyperparathyroidism of renal origin: Secondary | ICD-10-CM | POA: Diagnosis not present

## 2021-11-16 DIAGNOSIS — D631 Anemia in chronic kidney disease: Secondary | ICD-10-CM | POA: Diagnosis not present

## 2021-11-16 DIAGNOSIS — N186 End stage renal disease: Secondary | ICD-10-CM | POA: Diagnosis not present

## 2021-11-17 DIAGNOSIS — N186 End stage renal disease: Secondary | ICD-10-CM | POA: Diagnosis not present

## 2021-11-17 DIAGNOSIS — D631 Anemia in chronic kidney disease: Secondary | ICD-10-CM | POA: Diagnosis not present

## 2021-11-17 DIAGNOSIS — N2581 Secondary hyperparathyroidism of renal origin: Secondary | ICD-10-CM | POA: Diagnosis not present

## 2021-11-18 DIAGNOSIS — D631 Anemia in chronic kidney disease: Secondary | ICD-10-CM | POA: Diagnosis not present

## 2021-11-18 DIAGNOSIS — N2581 Secondary hyperparathyroidism of renal origin: Secondary | ICD-10-CM | POA: Diagnosis not present

## 2021-11-18 DIAGNOSIS — N186 End stage renal disease: Secondary | ICD-10-CM | POA: Diagnosis not present

## 2021-11-19 DIAGNOSIS — N186 End stage renal disease: Secondary | ICD-10-CM | POA: Diagnosis not present

## 2021-11-19 DIAGNOSIS — D631 Anemia in chronic kidney disease: Secondary | ICD-10-CM | POA: Diagnosis not present

## 2021-11-19 DIAGNOSIS — N2581 Secondary hyperparathyroidism of renal origin: Secondary | ICD-10-CM | POA: Diagnosis not present

## 2021-11-20 DIAGNOSIS — N2581 Secondary hyperparathyroidism of renal origin: Secondary | ICD-10-CM | POA: Diagnosis not present

## 2021-11-20 DIAGNOSIS — D631 Anemia in chronic kidney disease: Secondary | ICD-10-CM | POA: Diagnosis not present

## 2021-11-20 DIAGNOSIS — N186 End stage renal disease: Secondary | ICD-10-CM | POA: Diagnosis not present

## 2021-11-21 DIAGNOSIS — R627 Adult failure to thrive: Secondary | ICD-10-CM | POA: Diagnosis not present

## 2021-11-21 DIAGNOSIS — I5020 Unspecified systolic (congestive) heart failure: Secondary | ICD-10-CM | POA: Diagnosis not present

## 2021-11-21 DIAGNOSIS — I959 Hypotension, unspecified: Secondary | ICD-10-CM | POA: Diagnosis not present

## 2021-11-21 DIAGNOSIS — N2581 Secondary hyperparathyroidism of renal origin: Secondary | ICD-10-CM | POA: Diagnosis not present

## 2021-11-21 DIAGNOSIS — N186 End stage renal disease: Secondary | ICD-10-CM | POA: Diagnosis not present

## 2021-11-21 DIAGNOSIS — D631 Anemia in chronic kidney disease: Secondary | ICD-10-CM | POA: Diagnosis not present

## 2021-11-21 DIAGNOSIS — L89613 Pressure ulcer of right heel, stage 3: Secondary | ICD-10-CM | POA: Diagnosis not present

## 2021-11-22 ENCOUNTER — Encounter (HOSPITAL_BASED_OUTPATIENT_CLINIC_OR_DEPARTMENT_OTHER): Payer: Medicare Other | Admitting: Internal Medicine

## 2021-11-22 ENCOUNTER — Other Ambulatory Visit: Payer: Self-pay

## 2021-11-22 DIAGNOSIS — L89613 Pressure ulcer of right heel, stage 3: Secondary | ICD-10-CM

## 2021-11-22 DIAGNOSIS — Z992 Dependence on renal dialysis: Secondary | ICD-10-CM | POA: Diagnosis not present

## 2021-11-22 DIAGNOSIS — D631 Anemia in chronic kidney disease: Secondary | ICD-10-CM | POA: Diagnosis not present

## 2021-11-22 DIAGNOSIS — E039 Hypothyroidism, unspecified: Secondary | ICD-10-CM | POA: Diagnosis not present

## 2021-11-22 DIAGNOSIS — F259 Schizoaffective disorder, unspecified: Secondary | ICD-10-CM | POA: Diagnosis not present

## 2021-11-22 DIAGNOSIS — G40909 Epilepsy, unspecified, not intractable, without status epilepticus: Secondary | ICD-10-CM | POA: Diagnosis not present

## 2021-11-22 DIAGNOSIS — N2581 Secondary hyperparathyroidism of renal origin: Secondary | ICD-10-CM | POA: Diagnosis not present

## 2021-11-22 DIAGNOSIS — N186 End stage renal disease: Secondary | ICD-10-CM | POA: Diagnosis not present

## 2021-11-22 NOTE — Progress Notes (Signed)
Jane [name], Jane [name] ([id number]) Visit Report for 11/22/2021 Chief Complaint Document Details Patient Name: Date of Service: Jane [name], Jane [name]. 11/22/2021 9:00 A M Medical Record Number: [id number] Patient Account Number: [id number] Date of Birth/Sex: Treating RN: Oct 30, 1965 (56 y.o. Jane [name] Primary Care Provider: [name] Other Clinician: Referring Provider: Treating Provider/Extender: Carlus Pavlov in Treatment: 9 Information Obtained from: Patient Chief Complaint Right heel wound Electronic Signature(s) Signed: 11/22/2021 9:56:58 AM By: [name] DO Entered By: [name] on 11/22/2021 09:51:59 -------------------------------------------------------------------------------- Debridement Details Patient Name: Date of Service: Jane Nurse M. 11/22/2021 9:00 A M Medical Record Number: [id number] Patient Account Number: [id number] Date of Birth/Sex: Treating RN: [birth year] (56 y.o. Jane [name] Primary Care Provider: [name] Other Clinician: Referring Provider: Treating Provider/Extender: Carlus Pavlov in Treatment: 9 Debridement Performed for Assessment: Wound #2 Right Calcaneus Performed By: Physician [name], DO Debridement Type: Debridement Level of Consciousness (Pre-procedure): Awake and Alert Pre-procedure Verification/Time Out Yes - 09:00 Taken: Start Time: 09:00 Pain Control: Lidocaine 5% topical ointment T Area Debrided (L x W): otal 2 (cm) x 1.8 (cm) = 3.6 (cm) Tissue and other material debrided: Non-Viable, Slough, Subcutaneous, Slough Level: Skin/Subcutaneous Tissue Debridement Description: Excisional Instrument: Curette Bleeding: Minimum Hemostasis Achieved: Pressure End Time: 09:03 Procedural Pain: 2 Post Procedural Pain: 2 Response to Treatment: Procedure was tolerated well Level of Consciousness (Post- Awake and Alert procedure): Post  Debridement Measurements of Total Wound Length: (cm) 2 Stage: Unstageable/Unclassified Width: (cm) 1.8 Depth: (cm) 0.5 Volume: (cm) 1.414 Character of Wound/Ulcer Post Debridement: Improved Post Procedure Diagnosis Same as Pre-procedure Electronic Signature(s) Signed: 11/22/2021 9:56:58 AM By: [name] DO Signed: 11/22/2021 5:33:56 PM By: Dellie Catholic RN Entered By: Dellie Catholic on 11/22/2021 09:07:30 -------------------------------------------------------------------------------- HPI Details Patient Name: Date of Service: Jane Nurse M. 11/22/2021 9:00 A M Medical Record Number: [id number] Patient Account Number: [id number] Date of Birth/Sex: Treating RN: Jul 27, 1966 (56 y.o. Jane [name] Primary Care Provider: [name] Other Clinician: Referring Provider: Treating Provider/Extender: Carlus Pavlov in Treatment: 9 History of Present Illness HPI Description: Admission 09/19/2021 Ms. [place] Jane [name] is a 56 year old female with a past medical history of schizoaffective disorder, end-stage renal disease on peritoneal dialysis and seizure disorder that presents to the clinic for a right heel wound that has been present since her hospitalization in April 2022 because of an epidural abscess that required surgery. During her hospitalization she developed the heel wound and a breast wound. She would lean more aggressively to the left side for long periods of time. The breast wound has healed. The right heel Has developed a loose callus. She is not mobile and is either in her wheelchair or in the bed most of the day. She currently denies pain, increased warmth or erythema to the area or drainage. Son-in-law is present and helps with the history. Patient is unable to participate fully in her care due to her diagnosis of schizoaffective disorder. 10/03/2021; patient presents for follow-up. She has developed a new pressure injury to the  calcaneus and has skin breakdown to the surrounding area. She has been using Hydrofera Blue to the previous wound site. She has no issues or complaints today. She denies signs of infection. 12/19; patient presents for follow-up. She has been using Hydrofera Blue to the wound sites. She went back and obtained TBI's. She currently denies signs of infection. 1/3; patient presents for follow-up. She has been using Hydrofera Blue to  the medial wound and Santyl to the calcaneus wound. She has no issues or complaints today. She denies signs of infection. 1/17; patient presents for follow-up. She has been using Hydrofera Blue to the medial wound along with Santyl. She been using Santyl to the calcaneus wound. She has no issues or complaints today. She denies signs of infection. 1/31; patient presents for follow-up. The medial wound remains closed. She has been using Santyl to the calcaneus wound. She has no issues or complaints today. She denies signs of infection. Electronic Signature(s) Signed: 11/22/2021 9:56:58 AM By: [name] DO Entered By: [name] on 11/22/2021 09:52:28 -------------------------------------------------------------------------------- Physical Exam Details Patient Name: Date of Service: Jane Nurse M. 11/22/2021 9:00 A M Medical Record Number: [id number] Patient Account Number: [id number] Date of Birth/Sex: Treating RN: [birth year] (56 y.o. Jane [name] Primary Care Provider: [name] Other Clinician: Referring Provider: Treating Provider/Extender: Carlus Pavlov in Treatment: 9 Constitutional respirations regular, non-labored and within target range for patient.. Cardiovascular 2+ dorsalis pedis/posterior tibialis pulses. Psychiatric pleasant and cooperative. Notes T the right heel there is an open wound with nonviable tissue throughout. No surrounding signs of infection. o Electronic Signature(s) Signed:  11/22/2021 9:56:58 AM By: [name] DO Entered By: [name] on 11/22/2021 09:53:28 -------------------------------------------------------------------------------- Physician Orders Details Patient Name: Date of Service: Jane Nurse M. 11/22/2021 9:00 A M Medical Record Number: [id number] Patient Account Number: [id number] Date of Birth/Sex: Treating RN: [birth year] (56 y.o. Jane [name] Primary Care Provider: [name] Other Clinician: Referring Provider: Treating Provider/Extender: Carlus Pavlov in Treatment: 9 Verbal / Phone Orders: No Diagnosis Coding ICD-10 Coding Code Description L89.613 Pressure ulcer of right heel, stage 3 F25.9 Schizoaffective disorder, unspecified N18.6 End stage renal disease L89.610 Pressure ulcer of right heel, unstageable Follow-up Appointments ppointment in 1 week. - Dr Heber Valmy Return A Bathing/ Shower/ Hygiene May shower and wash wound with soap and water. - when changing dressing Edema Control - Lymphedema / SCD / Other Elevate legs to the level of the heart or above for 30 minutes daily and/or when sitting, a frequency of: Off-Loading Other: - Prevalon Boot to right foot, avoid direct pressure to back of heels Additional Orders / Instructions Follow Nutritious Diet Wound Treatment Wound #2 - Calcaneus Wound Laterality: Right Cleanser: Soap and Water 1 x Per Day/30 Days Discharge Instructions: May shower and wash wound with dial antibacterial soap and water prior to dressing change. Peri-Wound Care: Skin Prep 1 x Per Day/30 Days Discharge Instructions: Use skin prep as directed Prim Dressing: Santyl Ointment 1 x Per Day/30 Days ary Discharge Instructions: Apply nickel thick amount to wound bed as instructed Secondary Dressing: Woven Gauze Sponge, Non-Sterile 4x4 in 1 x Per Day/30 Days Discharge Instructions: Apply over primary dressing as directed. Secondary Dressing: ALLEVYN Heel  4 1/2in x 5 1/2in / 10.5cm x 13.5cm 1 x Per Day/30 Days Discharge Instructions: Apply over primary dressing as directed. Secured With: [employer], 4.5x3.1 (in/yd) 1 x Per Day/30 Days Discharge Instructions: Secure with Kerlix as directed. Secured With: Paper Tape, 2x10 (in/yd) 1 x Per Day/30 Days Discharge Instructions: Secure dressing with tape as directed. Electronic Signature(s) Signed: 11/22/2021 9:56:58 AM By: [name] DO Entered By: [name] on 11/22/2021 09:54:14 -------------------------------------------------------------------------------- Problem List Details Patient Name: Date of Service: Jane Nurse M. 11/22/2021 9:00 A M Medical Record Number: [id number] Patient Account Number: [id number] Date of Birth/Sex: Treating RN: Apr 05, 1966 (56 y.o. F) [place],  Mechele Claude Primary Care Provider: [name] Other Clinician: Referring Provider: Treating Provider/Extender: Carlus Pavlov in Treatment: 9 Active Problems ICD-10 Encounter Code Description Active Date MDM Diagnosis L89.613 Pressure ulcer of right heel, stage 3 09/19/2021 No Yes F25.9 Schizoaffective disorder, unspecified 09/19/2021 No Yes N18.6 End stage renal disease 09/19/2021 No Yes L89.610 Pressure ulcer of right heel, unstageable 10/03/2021 No Yes Inactive Problems Resolved Problems Electronic Signature(s) Signed: 11/22/2021 9:56:58 AM By: [name] DO Entered By: [name] on 11/22/2021 09:51:41 -------------------------------------------------------------------------------- Progress Note Details Patient Name: Date of Service: Jane Nurse M. 11/22/2021 9:00 A M Medical Record Number: [id number] Patient Account Number: [id number] Date of Birth/Sex: Treating RN: March 24, 1966 (56 y.o. Jane [name] Primary Care Provider: [name] Other Clinician: Referring Provider: Treating Provider/Extender: Carlus Pavlov in Treatment: 9 Subjective Chief Complaint Information obtained from Patient Right heel wound History of Present Illness (HPI) Admission 09/19/2021 Ms. Amiyrah Lamere is a 56 year old female with a past medical history of schizoaffective disorder, end-stage renal disease on peritoneal dialysis and seizure disorder that presents to the clinic for a right heel wound that has been present since her hospitalization in April 2022 because of an epidural abscess that required surgery. During her hospitalization she developed the heel wound and a breast wound. She would lean more aggressively to the left side for long periods of time. The breast wound has healed. The right heel Has developed a loose callus. She is not mobile and is either in her wheelchair or in the bed most of the day. She currently denies pain, increased warmth or erythema to the area or drainage. Son-in-law is present and helps with the history. Patient is unable to participate fully in her care due to her diagnosis of schizoaffective disorder. 10/03/2021; patient presents for follow-up. She has developed a new pressure injury to the calcaneus and has skin breakdown to the surrounding area. She has been using Hydrofera Blue to the previous wound site. She has no issues or complaints today. She denies signs of infection. 12/19; patient presents for follow-up. She has been using Hydrofera Blue to the wound sites. She went back and obtained TBI's. She currently denies signs of infection. 1/3; patient presents for follow-up. She has been using Hydrofera Blue to the medial wound and Santyl to the calcaneus wound. She has no issues or complaints today. She denies signs of infection. 1/17; patient presents for follow-up. She has been using Hydrofera Blue to the medial wound along with Santyl. She been using Santyl to the calcaneus wound. She has no issues or complaints today. She denies signs of  infection. 1/31; patient presents for follow-up. The medial wound remains closed. She has been using Santyl to the calcaneus wound. She has no issues or complaints today. She denies signs of infection. Patient History Information obtained from Patient, Caregiver. Family History Cancer - Mother, Kidney Disease - Maternal Grandparents, No family history of Diabetes, Heart Disease, Hereditary Spherocytosis, Hypertension, Lung Disease, Seizures, Stroke, Thyroid Problems, Tuberculosis. Social History Never smoker, Marital Status - Single, Alcohol Use - Never, Drug Use - No History, Caffeine Use - Rarely. Medical History Eyes Patient has history of Cataracts Hematologic/Lymphatic Patient has history of Anemia Cardiovascular Patient has history of Congestive Heart Failure, Hypotension Denies history of Hypertension Genitourinary Patient has history of End Stage Renal Disease - Dialysis Neurologic Patient has history of Seizure Disorder - last seizure several years ago Medical A Surgical History Notes nd Endocrine Hypothyroidism Musculoskeletal Laminectomy 05/2021  Psychiatric Schizoaffective Disorder Objective Constitutional respirations regular, non-labored and within target range for patient.. Vitals Time Taken: 8:22 AM, Temperature: 98.6 F, Pulse: 105 bpm, Respiratory Rate: 18 breaths/min, Blood Pressure: 144/82 mmHg. Cardiovascular 2+ dorsalis pedis/posterior tibialis pulses. Psychiatric pleasant and cooperative. General Notes: T the right heel there is an open wound with nonviable tissue throughout. No surrounding signs of infection. o Integumentary (Hair, Skin) Wound #2 status is Open. Original cause of wound was Pressure Injury. The date acquired was: 10/03/2021. The wound has been in treatment 7 weeks. The wound is located on the Right Calcaneus. The wound measures 2cm length x 1.8cm width x 0.5cm depth; 2.827cm^2 area and 1.414cm^3 volume. There is Fat Layer (Subcutaneous  Tissue) exposed. There is no tunneling or undermining noted. There is a medium amount of serosanguineous drainage noted. The wound margin is distinct with the outline attached to the wound base. There is small (1-33%) red granulation within the wound bed. There is a large (67-100%) amount of necrotic tissue within the wound bed including Eschar and Adherent Slough. Assessment Active Problems ICD-10 Pressure ulcer of right heel, stage 3 Schizoaffective disorder, unspecified End stage renal disease Pressure ulcer of right heel, unstageable Patient's calcaneus wound is stable. I debrided nonviable tissue. I was able to obtain some granulation tissue although The wound bed is still mostly nonviable Tissue. I recommended aggressive offloading and continuing Santyl daily. No signs of surrounding infection. Procedures Wound #2 Pre-procedure diagnosis of Wound #2 is a Pressure Ulcer located on the Right Calcaneus . There was a Excisional Skin/Subcutaneous Tissue Debridement with a total area of 3.6 sq cm performed by [name], DO. With the following instrument(s): Curette to remove Non-Viable tissue/material. Material removed includes Subcutaneous Tissue and Slough and after achieving pain control using Lidocaine 5% topical ointment. No specimens were taken. A time out was conducted at 09:00, prior to the start of the procedure. A Minimum amount of bleeding was controlled with Pressure. The procedure was tolerated well with a pain level of 2 throughout and a pain level of 2 following the procedure. Post Debridement Measurements: 2cm length x 1.8cm width x 0.5cm depth; 1.414cm^3 volume. Post debridement Stage noted as Unstageable/Unclassified. Character of Wound/Ulcer Post Debridement is improved. Post procedure Diagnosis Wound #2: Same as Pre-Procedure Plan Follow-up Appointments: Return Appointment in 1 week. - Dr Heber Seymour Bathing/ Shower/ Hygiene: May shower and wash wound with soap and  water. - when changing dressing Edema Control - Lymphedema / SCD / Other: Elevate legs to the level of the heart or above for 30 minutes daily and/or when sitting, a frequency of: Off-Loading: Other: - Prevalon Boot to right foot, avoid direct pressure to back of heels Additional Orders / Instructions: Follow Nutritious Diet WOUND #2: - Calcaneus Wound Laterality: Right Cleanser: Soap and Water 1 x Per Day/30 Days Discharge Instructions: May shower and wash wound with dial antibacterial soap and water prior to dressing change. Peri-Wound Care: Skin Prep 1 x Per Day/30 Days Discharge Instructions: Use skin prep as directed Prim Dressing: Santyl Ointment 1 x Per Day/30 Days ary Discharge Instructions: Apply nickel thick amount to wound bed as instructed Secondary Dressing: Woven Gauze Sponge, Non-Sterile 4x4 in 1 x Per Day/30 Days Discharge Instructions: Apply over primary dressing as directed. Secondary Dressing: ALLEVYN Heel 4 1/2in x 5 1/2in / 10.5cm x 13.5cm 1 x Per Day/30 Days Discharge Instructions: Apply over primary dressing as directed. Secured With: [employer], 4.5x3.1 (in/yd) 1 x Per Day/30 Days Discharge Instructions: Secure  with Kerlix as directed. Secured With: Paper T ape, 2x10 (in/yd) 1 x Per Day/30 Days Discharge Instructions: Secure dressing with tape as directed. 1. In office sharp debridement 2. Continue Santyl daily 3. Aggressive offloadingooPrevalon boot 4. Follow-up in 1 week Electronic Signature(s) Signed: 11/22/2021 9:56:58 AM By: [name] DO Entered By: [name] on 11/22/2021 09:56:09 -------------------------------------------------------------------------------- HxROS Details Patient Name: Date of Service: Jane Nurse M. 11/22/2021 9:00 A M Medical Record Number: [id number] Patient Account Number: [id number] Date of Birth/Sex: Treating RN: 1966-03-05 (56 y.o. Jane [name] Primary Care Provider: [name] Other  Clinician: Referring Provider: Treating Provider/Extender: Carlus Pavlov in Treatment: 9 Information Obtained From Patient Caregiver Eyes Medical History: Positive for: Cataracts Hematologic/Lymphatic Medical History: Positive for: Anemia Cardiovascular Medical History: Positive for: Congestive Heart Failure; Hypotension Negative for: Hypertension Endocrine Medical History: Past Medical History Notes: Hypothyroidism Genitourinary Medical History: Positive for: End Stage Renal Disease - Dialysis Musculoskeletal Medical History: Past Medical History Notes: Laminectomy 05/2021 Neurologic Medical History: Positive for: Seizure Disorder - last seizure several years ago Psychiatric Medical History: Past Medical History Notes: Schizoaffective Disorder HBO Extended History Items Eyes: Cataracts Immunizations Pneumococcal Vaccine: Received Pneumococcal Vaccination: No Implantable Devices Yes Family and Social History Cancer: Yes - Mother; Diabetes: No; Heart Disease: No; Hereditary Spherocytosis: No; Hypertension: No; Kidney Disease: Yes - Maternal Grandparents; Lung Disease: No; Seizures: No; Stroke: No; Thyroid Problems: No; Tuberculosis: No; Never smoker; Marital Status - Single; Alcohol Use: Never; Drug Use: No History; Caffeine Use: Rarely; Financial Concerns: No; Food, Clothing or Shelter Needs: No; Support System Lacking: No; Transportation Concerns: No Electronic Signature(s) Signed: 11/22/2021 9:56:58 AM By: [name] DO Signed: 11/22/2021 5:08:46 PM By: Deon Pilling RN, BSN Entered By: [name] on 11/22/2021 09:52:35 -------------------------------------------------------------------------------- SuperBill Details Patient Name: Date of Service: Jane Nurse M. 11/22/2021 Medical Record Number: [id number] Patient Account Number: [id number] Date of Birth/Sex: Treating RN: [birth year] (56 y.o. [name],  [name] Primary Care Provider: [name] Other Clinician: Referring Provider: Treating Provider/Extender: Carlus Pavlov in Treatment: 9 Diagnosis Coding ICD-10 Codes Code Description [phone] Pressure ulcer of right heel, stage 3 F25.9 Schizoaffective disorder, unspecified N18.6 End stage renal disease L89.610 Pressure ulcer of right heel, unstageable Facility Procedures CPT4 Code: [id number] Description: 29518 - DEB SUBQ TISSUE 20 SQ CM/< ICD-10 Diagnosis Description L89.613 Pressure ulcer of right heel, stage 3 Modifier: Quantity: 1 Physician Procedures : CPT4 Code Description Modifier [id number] - WC PHYS SUBQ TISS 20 SQ CM ICD-10 Diagnosis Description L89.613 Pressure ulcer of right heel, stage 3 Quantity: 1 Electronic Signature(s) Signed: 11/22/2021 9:56:58 AM By: [name] DO Entered By: [name] on 11/22/2021 09:56:26

## 2021-11-22 NOTE — Progress Notes (Signed)
Jane [name], Jane [name] ([id number]) Visit Report for 11/22/2021 Arrival Information Details Patient Name: Date of Service: Jane [name], Jane [name]. 11/22/2021 9:00 A M Medical Record Number: [id number] Patient Account Number: [id number] Date of Birth/Sex: Treating RN: [birth year] (56 y.o. [name], [name] Primary Care Louana Fontenot: [name] Other Clinician: Referring Malan Werk: Treating Cellie Dardis/Extender: Carlus Pavlov in Treatment: 9 Visit Information History Since Last Visit Added or deleted any medications: No Patient Arrived: Wheel Chair Any new allergies or adverse reactions: No Arrival Time: 08:21 Had a fall or experienced change in No Accompanied By: Family member activities of daily living that may affect Transfer Assistance: Manual risk of falls: Patient Identification Verified: Yes Signs or symptoms of abuse/neglect since last visito No Secondary Verification Process Completed: Yes Hospitalized since last visit: No Patient Requires Transmission-Based Precautions: No Implantable device outside of the clinic excluding No Patient Has Alerts: Yes cellular tissue based products placed in the center Patient Alerts: R ABI=Non Comp since last visit: Has Dressing in Place as Prescribed: Yes Pain Present Now: No Electronic Signature(s) Signed: 11/22/2021 5:08:46 PM By: Deon Pilling RN, BSN Entered By: Deon Pilling on 11/22/2021 08:21:53 -------------------------------------------------------------------------------- Encounter Discharge Information Details Patient Name: Date of Service: Jane Nurse M. 11/22/2021 9:00 A M Medical Record Number: [id number] Patient Account Number: [id number] Date of Birth/Sex: Treating RN: [birth year] (56 y.o. [name] Primary Care Kaidon Kinker: [name] Other Clinician: Referring Terrye Dombrosky: Treating Bethanne Mule/Extender: Carlus Pavlov in Treatment: 9 Encounter Discharge  Information Items Post Procedure Vitals Discharge Condition: Stable Temperature (F): 98.6 Ambulatory Status: Wheelchair Pulse (bpm): 105 Discharge Destination: Home Respiratory Rate (breaths/min): 18 Transportation: Private Auto Blood Pressure (mmHg): 144/82 Accompanied By: caregiver Schedule Follow-up Appointment: Yes Clinical Summary of Care: Patient Declined Electronic Signature(s) Signed: 11/22/2021 5:33:56 PM By: Dellie Catholic RN Entered By: Dellie Catholic on 11/22/2021 09:20:02 -------------------------------------------------------------------------------- Lower Extremity Assessment Details Patient Name: Date of Service: Jane [name], Jane [name]. 11/22/2021 9:00 A M Medical Record Number: [id number] Patient Account Number: [id number] Date of Birth/Sex: Treating RN: 01-26-1966 (56 y.o. [name] Primary Care Krystin Keeven: [name] Other Clinician: Referring Maudy Yonan: Treating Newman Waren/Extender: Carlus Pavlov in Treatment: 9 Edema Assessment Assessed: [name]: No] [Right: No] Edema: [Left: Ye] [Right: s] Calf Left: Right: Point of Measurement: 30 cm From Medial Instep 35.5 cm Ankle Left: Right: Point of Measurement: 7 cm From Medial Instep 22.5 cm Electronic Signature(s) Signed: 11/22/2021 5:33:56 PM By: Dellie Catholic RN Entered By: Dellie Catholic on 11/22/2021 08:22:59 -------------------------------------------------------------------------------- Multi Wound Chart Details Patient Name: Date of Service: Jane Nurse M. 11/22/2021 9:00 A M Medical Record Number: [id number] Patient Account Number: [id number] Date of Birth/Sex: Treating RN: 1965/12/07 (56 y.o. [name], [name] Primary Care Knute Mazzuca: [name] Other Clinician: Referring Deeanna Beightol: Treating Teshawn Moan/Extender: Carlus Pavlov in Treatment: 9 Vital Signs Height(in): Pulse(bpm): 105 Weight(lbs): Blood Pressure(mmHg):  144/82 Body Mass Index(BMI): Temperature(F): 98.6 Respiratory Rate(breaths/min): 18 Photos: [N/A:N/A] Right Calcaneus N/A N/A Wound Location: Pressure Injury N/A N/A Wounding Event: Pressure Ulcer N/A N/A Primary Etiology: Cataracts, Anemia, Congestive Heart N/A N/A Comorbid History: Failure, Hypotension, End Stage Renal Disease, Seizure Disorder 10/03/2021 N/A N/A Date Acquired: 7 N/A N/A Weeks of Treatment: Open N/A N/A Wound Status: No N/A N/A Wound Recurrence: 2x1.8x0.5 N/A N/A Measurements L x W x D (cm) 2.827 N/A N/A A (cm) : rea 1.414 N/A N/A Volume (cm) : 46.40% N/A N/A % Reduction in A rea: -167.80% N/A N/A % Reduction  in Volume: Unstageable/Unclassified N/A N/A Classification: Medium N/A N/A Exudate A mount: Serosanguineous N/A N/A Exudate Type: red, brown N/A N/A Exudate Color: Distinct, outline attached N/A N/A Wound Margin: Small (1-33%) N/A N/A Granulation A mount: Red N/A N/A Granulation Quality: Large (67-100%) N/A N/A Necrotic A mount: Eschar, Adherent Slough N/A N/A Necrotic Tissue: Fat Layer (Subcutaneous Tissue): Yes N/A N/A Exposed Structures: Fascia: No Tendon: No Muscle: No Joint: No Bone: No Small (1-33%) N/A N/A Epithelialization: Debridement - Excisional N/A N/A Debridement: Pre-procedure Verification/Time Out 09:00 N/A N/A Taken: Lidocaine 5% topical ointment N/A N/A Pain Control: Subcutaneous, Slough N/A N/A Tissue Debrided: Skin/Subcutaneous Tissue N/A N/A Level: 3.6 N/A N/A Debridement A (sq cm): rea Curette N/A N/A Instrument: Minimum N/A N/A Bleeding: Pressure N/A N/A Hemostasis A chieved: 2 N/A N/A Procedural Pain: 2 N/A N/A Post Procedural Pain: Procedure was tolerated well N/A N/A Debridement Treatment Response: 2x1.8x0.5 N/A N/A Post Debridement Measurements L x W x D (cm) 1.414 N/A N/A Post Debridement Volume: (cm) Unstageable/Unclassified N/A N/A Post Debridement Stage: Debridement  N/A N/A Procedures Performed: Treatment Notes Wound #2 (Calcaneus) Wound Laterality: Right Cleanser Soap and Water Discharge Instruction: May shower and wash wound with dial antibacterial soap and water prior to dressing change. Peri-Wound Care Skin Prep Discharge Instruction: Use skin prep as directed Topical Primary Dressing Santyl Ointment Discharge Instruction: Apply nickel thick amount to wound bed as instructed Secondary Dressing Woven Gauze Sponge, Non-Sterile 4x4 in Discharge Instruction: Apply over primary dressing as directed. ALLEVYN Heel 4 1/2in x 5 1/2in / 10.5cm x 13.5cm Discharge Instruction: Apply over primary dressing as directed. Secured With [employer], 4.5x3.1 (in/yd) Discharge Instruction: Secure with Kerlix as directed. Paper Tape, 2x10 (in/yd) Discharge Instruction: Secure dressing with tape as directed. Compression Wrap Compression Stockings Add-Ons Electronic Signature(s) Signed: 11/22/2021 9:56:58 AM By: [name] DO Signed: 11/22/2021 5:08:46 PM By: Deon Pilling RN, BSN Entered By: [name] on 11/22/2021 09:51:47 -------------------------------------------------------------------------------- Multi-Disciplinary Care Plan Details Patient Name: Date of Service: Jane Nurse M. 11/22/2021 9:00 A M Medical Record Number: [id number] Patient Account Number: [id number] Date of Birth/Sex: Treating RN: 1966/04/07 (56 y.o. [name] Primary Care Chiana Wamser: [name] Other Clinician: Referring Alaena Strader: Treating Ferron Ishmael/Extender: Carlus Pavlov in Treatment: 9 Active Inactive Pressure Nursing Diagnoses: Knowledge deficit related to causes and risk factors for pressure ulcer development Goals: Patient will remain free from development of additional pressure ulcers Date Initiated: 09/19/2021 Target Resolution Date: 12/16/2021 Goal Status: Active Interventions: Assess:  immobility, friction, shearing, incontinence upon admission and as needed Assess offloading mechanisms upon admission and as needed Treatment Activities: Pressure reduction/relief device ordered : 09/19/2021 T ordered outside of clinic : 09/19/2021 est Notes: Wound/Skin Impairment Nursing Diagnoses: Impaired tissue integrity Goals: Patient/caregiver will verbalize understanding of skin care regimen Date Initiated: 09/19/2021 Target Resolution Date: 12/16/2021 Goal Status: Active Ulcer/skin breakdown will have a volume reduction of 30% by week 4 Date Initiated: 09/19/2021 Date Inactivated: 10/25/2021 Target Resolution Date: 10/17/2021 Goal Status: Met Ulcer/skin breakdown will have a volume reduction of 50% by week 8 Date Initiated: 10/25/2021 Target Resolution Date: 12/16/2021 Goal Status: Active Interventions: Assess patient/caregiver ability to obtain necessary supplies Assess patient/caregiver ability to perform ulcer/skin care regimen upon admission and as needed Assess ulceration(s) every visit Provide education on ulcer and skin care Treatment Activities: Topical wound management initiated : 09/19/2021 Notes: Electronic Signature(s) Signed: 11/22/2021 5:33:56 PM By: Dellie Catholic RN Signed: 11/22/2021 5:33:56 PM By: Dellie Catholic RN Entered By: Minus Liberty,  Mechele Claude on 11/22/2021 08:23:59 -------------------------------------------------------------------------------- Pain Assessment Details Patient Name: Date of Service: Jane [name], Jane [name]. 11/22/2021 9:00 A M Medical Record Number: [id number] Patient Account Number: [id number] Date of Birth/Sex: Treating RN: Dec 12, 1965 (56 y.o. [name] Primary Care Khairi Garman: [name] Other Clinician: Referring Istvan Behar: Treating Miliana Gangwer/Extender: Carlus Pavlov in Treatment: 9 Active Problems Location of Pain Severity and Description of Pain Patient Has Paino No Site Locations Rate the  pain. Current Pain Level: 0 Pain Management and Medication Current Pain Management: Medication: No Cold Application: No Rest: No Massage: No Activity: No T.E.N.S.: No Heat Application: No Leg drop or elevation: No Is the Current Pain Management Adequate: Adequate How does your wound impact your activities of daily livingo Sleep: No Bathing: No Appetite: No Relationship With Others: No Bladder Continence: No Emotions: No Bowel Continence: No Work: No Toileting: No Drive: No Dressing: No Hobbies: No Engineer, maintenance) Signed: 11/22/2021 5:08:46 PM By: Deon Pilling RN, BSN Entered By: Deon Pilling on 11/22/2021 08:22:06 -------------------------------------------------------------------------------- Patient/Caregiver Education Details Patient Name: Date of Service: Jane [name] 1/31/2023andnbsp9:00 A M Medical Record Number: [id number] Patient Account Number: [id number] Date of Birth/Gender: Treating RN: 05-14-1966 (56 y.o. [name] Primary Care Physician: [name] Other Clinician: Referring Physician: Treating Physician/Extender: Carlus Pavlov in Treatment: 9 Education Assessment Education Provided To: Caregiver Education Topics Provided Wound/Skin Impairment: Methods: Explain/Verbal Responses: Return demonstration correctly [employer]) Signed: 11/22/2021 5:33:56 PM By: Dellie Catholic RN Entered By: Dellie Catholic on 11/22/2021 00:86:76 -------------------------------------------------------------------------------- Wound Assessment Details Patient Name: Date of Service: Jane Nurse M. 11/22/2021 9:00 A M Medical Record Number: [id number] Patient Account Number: [id number] Date of Birth/Sex: Treating RN: [birth year] (56 y.o. [name] Primary Care Judah Carchi: [name] Other Clinician: Referring Thomas Mabry: Treating Mando Blatz/Extender: Carlus Pavlov in Treatment: 9 Wound Status Wound Number: 2 Primary Pressure Ulcer Etiology: Wound Location: Right Calcaneus Wound Open Wounding Event: Pressure Injury Status: Date Acquired: 10/03/2021 Comorbid Cataracts, Anemia, Congestive Heart Failure, Hypotension, End Weeks Of Treatment: 7 History: Stage Renal Disease, Seizure Disorder Clustered Wound: No Photos Wound Measurements Length: (cm) 2 Width: (cm) 1.8 Depth: (cm) 0.5 Area: (cm) 2.827 Volume: (cm) 1.414 % Reduction in Area: 46.4% % Reduction in Volume: -167.8% Epithelialization: Small (1-33%) Tunneling: No Undermining: No Wound Description Classification: Unstageable/Unclassified Wound Margin: Distinct, outline attached Exudate Amount: Medium Exudate Type: Serosanguineous Exudate Color: red, brown Wound Bed Granulation Amount: Small (1-33%) Granulation Quality: Red Necrotic Amount: Large (67-100%) Necrotic Quality: Eschar, Adherent Slough Foul Odor After Cleansing: No Slough/Fibrino Yes Exposed Structure Fascia Exposed: No Fat Layer (Subcutaneous Tissue) Exposed: Yes Tendon Exposed: No Muscle Exposed: No Joint Exposed: No Bone Exposed: No Treatment Notes Wound #2 (Calcaneus) Wound Laterality: Right Cleanser Soap and Water Discharge Instruction: May shower and wash wound with dial antibacterial soap and water prior to dressing change. Peri-Wound Care Skin Prep Discharge Instruction: Use skin prep as directed Topical Primary Dressing Santyl Ointment Discharge Instruction: Apply nickel thick amount to wound bed as instructed Secondary Dressing Woven Gauze Sponge, Non-Sterile 4x4 in Discharge Instruction: Apply over primary dressing as directed. ALLEVYN Heel 4 1/2in x 5 1/2in / 10.5cm x 13.5cm Discharge Instruction: Apply over primary dressing as directed. Secured With [employer], 4.5x3.1 (in/yd) Discharge Instruction: Secure with Kerlix as directed. Paper Tape, 2x10  (in/yd) Discharge Instruction: Secure dressing with tape as directed. Compression Wrap Compression Stockings Add-Ons Electronic Signature(s) Signed: 11/22/2021 5:08:46 PM By: Deon Pilling RN,  BSN Signed: 11/22/2021 5:33:56 PM By: Dellie Catholic RN Entered By: Deon Pilling on 11/22/2021 08:22:37 -------------------------------------------------------------------------------- Vitals Details Patient Name: Date of Service: Jane Nurse M. 11/22/2021 9:00 A M Medical Record Number: [id number] Patient Account Number: [id number] Date of Birth/Sex: Treating RN: June 30, 1966 (56 y.o. [name] Primary Care Daiana Vitiello: [name] Other Clinician: Referring Eleni Frank: Treating Advik Weatherspoon/Extender: Carlus Pavlov in Treatment: 9 Vital Signs Time Taken: 08:22 Temperature (F): 98.6 Pulse (bpm): 105 Respiratory Rate (breaths/min): 18 Blood Pressure (mmHg): 144/82 Reference Range: 80 - 120 mg / dl Electronic Signature(s) Signed: 11/22/2021 5:33:56 PM By: Dellie Catholic RN Entered By: Dellie Catholic on 11/22/2021 09:18:06

## 2021-11-23 DIAGNOSIS — N186 End stage renal disease: Secondary | ICD-10-CM | POA: Diagnosis not present

## 2021-11-23 DIAGNOSIS — D631 Anemia in chronic kidney disease: Secondary | ICD-10-CM | POA: Diagnosis not present

## 2021-11-23 DIAGNOSIS — N2581 Secondary hyperparathyroidism of renal origin: Secondary | ICD-10-CM | POA: Diagnosis not present

## 2021-11-24 DIAGNOSIS — D631 Anemia in chronic kidney disease: Secondary | ICD-10-CM | POA: Diagnosis not present

## 2021-11-24 DIAGNOSIS — N2581 Secondary hyperparathyroidism of renal origin: Secondary | ICD-10-CM | POA: Diagnosis not present

## 2021-11-24 DIAGNOSIS — N186 End stage renal disease: Secondary | ICD-10-CM | POA: Diagnosis not present

## 2021-11-25 DIAGNOSIS — D631 Anemia in chronic kidney disease: Secondary | ICD-10-CM | POA: Diagnosis not present

## 2021-11-25 DIAGNOSIS — N2581 Secondary hyperparathyroidism of renal origin: Secondary | ICD-10-CM | POA: Diagnosis not present

## 2021-11-25 DIAGNOSIS — N186 End stage renal disease: Secondary | ICD-10-CM | POA: Diagnosis not present

## 2021-11-26 DIAGNOSIS — N2581 Secondary hyperparathyroidism of renal origin: Secondary | ICD-10-CM | POA: Diagnosis not present

## 2021-11-26 DIAGNOSIS — N186 End stage renal disease: Secondary | ICD-10-CM | POA: Diagnosis not present

## 2021-11-26 DIAGNOSIS — D631 Anemia in chronic kidney disease: Secondary | ICD-10-CM | POA: Diagnosis not present

## 2021-11-27 DIAGNOSIS — N2581 Secondary hyperparathyroidism of renal origin: Secondary | ICD-10-CM | POA: Diagnosis not present

## 2021-11-27 DIAGNOSIS — D631 Anemia in chronic kidney disease: Secondary | ICD-10-CM | POA: Diagnosis not present

## 2021-11-27 DIAGNOSIS — N186 End stage renal disease: Secondary | ICD-10-CM | POA: Diagnosis not present

## 2021-11-28 DIAGNOSIS — N186 End stage renal disease: Secondary | ICD-10-CM | POA: Diagnosis not present

## 2021-11-28 DIAGNOSIS — N2581 Secondary hyperparathyroidism of renal origin: Secondary | ICD-10-CM | POA: Diagnosis not present

## 2021-11-28 DIAGNOSIS — I5020 Unspecified systolic (congestive) heart failure: Secondary | ICD-10-CM | POA: Diagnosis not present

## 2021-11-28 DIAGNOSIS — I959 Hypotension, unspecified: Secondary | ICD-10-CM | POA: Diagnosis not present

## 2021-11-28 DIAGNOSIS — D631 Anemia in chronic kidney disease: Secondary | ICD-10-CM | POA: Diagnosis not present

## 2021-11-28 DIAGNOSIS — R627 Adult failure to thrive: Secondary | ICD-10-CM | POA: Diagnosis not present

## 2021-11-28 DIAGNOSIS — L89613 Pressure ulcer of right heel, stage 3: Secondary | ICD-10-CM | POA: Diagnosis not present

## 2021-11-29 ENCOUNTER — Other Ambulatory Visit: Payer: Self-pay

## 2021-11-29 ENCOUNTER — Encounter (HOSPITAL_BASED_OUTPATIENT_CLINIC_OR_DEPARTMENT_OTHER): Payer: Medicare Other | Attending: Internal Medicine | Admitting: Internal Medicine

## 2021-11-29 DIAGNOSIS — D631 Anemia in chronic kidney disease: Secondary | ICD-10-CM | POA: Diagnosis not present

## 2021-11-29 DIAGNOSIS — Z992 Dependence on renal dialysis: Secondary | ICD-10-CM | POA: Diagnosis not present

## 2021-11-29 DIAGNOSIS — L89613 Pressure ulcer of right heel, stage 3: Secondary | ICD-10-CM | POA: Diagnosis not present

## 2021-11-29 DIAGNOSIS — N2581 Secondary hyperparathyroidism of renal origin: Secondary | ICD-10-CM | POA: Diagnosis not present

## 2021-11-29 DIAGNOSIS — N186 End stage renal disease: Secondary | ICD-10-CM | POA: Diagnosis not present

## 2021-11-29 DIAGNOSIS — L89610 Pressure ulcer of right heel, unstageable: Secondary | ICD-10-CM

## 2021-11-29 DIAGNOSIS — G40909 Epilepsy, unspecified, not intractable, without status epilepticus: Secondary | ICD-10-CM | POA: Insufficient documentation

## 2021-11-29 NOTE — Progress Notes (Signed)
Jane [name], Jane [name] ([id number]) Visit Report for 11/29/2021 Chief Complaint Document Details Patient Name: Date of Service: Jane [name]. 11/29/2021 9:30 A M Medical Record Number: [id number] Patient Account Number: [id number] Date of Birth/Sex: Treating RN: 1966-03-08 (57 y.o. [name] Primary Care Provider: [name] Other Clinician: Referring Provider: Treating Provider/Extender: Carlus Pavlov in Treatment: 10 Information Obtained from: Patient Chief Complaint Right heel wound Electronic Signature(s) Signed: 11/29/2021 11:49:46 AM By: [name] DO Entered By: [name] on 11/29/2021 11:40:17 -------------------------------------------------------------------------------- Debridement Details Patient Name: Date of Service: Jane Nurse M. 11/29/2021 9:30 A M Medical Record Number: [id number] Patient Account Number: [id number] Date of Birth/Sex: Treating RN: 11/03/1965 (56 y.o. [name], [name] Primary Care Provider: [name] Other Clinician: Referring Provider: Treating Provider/Extender: Carlus Pavlov in Treatment: 10 Debridement Performed for Assessment: Wound #2 Right Calcaneus Performed By: Physician [name], DO Debridement Type: Debridement Level of Consciousness (Pre-procedure): Awake and Alert Pre-procedure Verification/Time Out Yes - 10:00 Taken: Start Time: 10:01 Pain Control: Lidocaine 5% topical ointment T Area Debrided (L x W): otal 1.7 (cm) x 1 (cm) = 1.7 (cm) Tissue and other material debrided: Viable, Non-Viable, Slough, Slough Level: Non-Viable Tissue Debridement Description: Selective/Open Wound Instrument: Curette, Forceps, Scissors Bleeding: Minimum Hemostasis Achieved: Pressure End Time: 10:06 Procedural Pain: 0 Post Procedural Pain: 0 Response to Treatment: Procedure was tolerated well Level of Consciousness (Post- Awake and  Alert procedure): Post Debridement Measurements of Total Wound Length: (cm) 1.7 Stage: Unstageable/Unclassified Width: (cm) 1 Depth: (cm) 0.5 Volume: (cm) 0.668 Character of Wound/Ulcer Post Debridement: Requires Further Debridement Post Procedure Diagnosis Same as Pre-procedure Electronic Signature(s) Signed: 11/29/2021 11:49:46 AM By: [name] DO Signed: 11/29/2021 6:07:56 PM By: Deon Pilling RN, BSN Entered By: Deon Pilling on 11/29/2021 10:10:25 -------------------------------------------------------------------------------- HPI Details Patient Name: Date of Service: Jane Nurse M. 11/29/2021 9:30 A M Medical Record Number: [id number] Patient Account Number: [id number] Date of Birth/Sex: Treating RN: 1966-01-30 (56 y.o. [name] Primary Care Provider: [name] Other Clinician: Referring Provider: Treating Provider/Extender: Carlus Pavlov in Treatment: 10 History of Present Illness HPI Description: Admission 09/19/2021 Ms. Jane [name] is a 55 year old female with a past medical history of schizoaffective disorder, end-stage renal disease on peritoneal dialysis and seizure disorder that presents to the clinic for a right heel wound that has been present since her hospitalization in April 2022 because of an epidural abscess that required surgery. During her hospitalization she developed the heel wound and a breast wound. She would lean more aggressively to the left side for long periods of time. The breast wound has healed. The right heel Has developed a loose callus. She is not mobile and is either in her wheelchair or in the bed most of the day. She currently denies pain, increased warmth or erythema to the area or drainage. Son-in-law is present and helps with the history. Patient is unable to participate fully in her care due to her diagnosis of schizoaffective disorder. 10/03/2021; patient presents for follow-up.  She has developed a new pressure injury to the calcaneus and has skin breakdown to the surrounding area. She has been using Hydrofera Blue to the previous wound site. She has no issues or complaints today. She denies signs of infection. 12/19; patient presents for follow-up. She has been using Hydrofera Blue to the wound sites. She went back and obtained TBI's. She currently denies signs of infection. 1/3; patient presents for follow-up. She  has been using Hydrofera Blue to the medial wound and Santyl to the calcaneus wound. She has no issues or complaints today. She denies signs of infection. 1/17; patient presents for follow-up. She has been using Hydrofera Blue to the medial wound along with Santyl. She been using Santyl to the calcaneus wound. She has no issues or complaints today. She denies signs of infection. 1/31; patient presents for follow-up. The medial wound remains closed. She has been using Santyl to the calcaneus wound. She has no issues or complaints today. She denies signs of infection. 2/7; patient presents for follow-up. She has been using Santyl to the calcaneus wound. The medial wound remains closed but does have callus surrounding this. She is wearing her Prevalon boots however takes these off pretty regularly. She currently denies signs of infection. Electronic Signature(s) Signed: 11/29/2021 11:49:46 AM By: [name] DO Entered By: [name] on 11/29/2021 11:43:00 -------------------------------------------------------------------------------- Paring/cutting of benign hyperkeratotic lesion 2-4 Details Patient Name: Date of Service: Jane [name]. 11/29/2021 9:30 A M Medical Record Number: [id number] Patient Account Number: [id number] Date of Birth/Sex: Treating RN: [birth year] (56 y.o. [name] Primary Care Provider: [name] Other Clinician: Referring Provider: Treating Provider/Extender: Carlus Pavlov  in Treatment: 10 Procedure Performed for: Non-Wound Location Performed By: Physician [name], DO Post Procedure Diagnosis Same as Pre-procedure Notes Parring of a callous to right medical callous using a size 3 curette. Electronic Signature(s) Signed: 11/29/2021 11:49:46 AM By: [name] DO Signed: 11/29/2021 6:07:56 PM By: Deon Pilling RN, BSN Entered By: Deon Pilling on 11/29/2021 10:02:58 -------------------------------------------------------------------------------- Physical Exam Details Patient Name: Date of Service: Jane Nurse M. 11/29/2021 9:30 A M Medical Record Number: [id number] Patient Account Number: [id number] Date of Birth/Sex: Treating RN: 1966/07/08 (56 y.o. [name] Primary Care Provider: [name] Other Clinician: Referring Provider: Treating Provider/Extender: Carlus Pavlov in Treatment: 10 Constitutional respirations regular, non-labored and within target range for patient.. Cardiovascular 2+ dorsalis pedis/posterior tibialis pulses. Psychiatric pleasant and cooperative. Notes T the right heel there is an open wound with nonviable tissue throughout. No surrounding signs of infection. Previous wound site continues to have a scab and o callus. This was pared down with no open wound. Electronic Signature(s) Signed: 11/29/2021 11:49:46 AM By: [name] DO Entered By: [name] on 11/29/2021 11:43:13 -------------------------------------------------------------------------------- Physician Orders Details Patient Name: Date of Service: Jane Nurse M. 11/29/2021 9:30 A M Medical Record Number: [id number] Patient Account Number: [id number] Date of Birth/Sex: Treating RN: February 10, 1966 (56 y.o. [name] Primary Care Provider: [name] Other Clinician: Referring Provider: Treating Provider/Extender: Carlus Pavlov in Treatment: 10 Verbal /  Phone Orders: No Diagnosis Coding Follow-up Appointments ppointment in 1 week. - Dr. Heber Sedley and [name], RN Return A Other: - X-ray outpatient at any [facility] facility. Bathing/ Shower/ Hygiene May shower and wash wound with soap and water. - when changing dressing Edema Control - Lymphedema / SCD / Other Elevate legs to the level of the heart or above for 30 minutes daily and/or when sitting, a frequency of: - 3-4 times a day throughout the day. Off-Loading Other: - Prevalon Boot to right foot, avoid direct pressure to back of heels. Additional Orders / Instructions Follow Nutritious Diet Wound Treatment Wound #2 - Calcaneus Wound Laterality: Right Cleanser: Soap and Water 1 x Per Day/30 Days Discharge Instructions: May shower and wash wound with dial antibacterial soap and water prior to dressing  change. Peri-Wound Care: Zinc Oxide Ointment 30g tube 1 x Per Day/30 Days Discharge Instructions: Apply Zinc Oxide around the wound bed with each dressing change Prim Dressing: Santyl Ointment 1 x Per Day/30 Days ary Discharge Instructions: Apply nickel thick amount to wound bed as instructed Secondary Dressing: Woven Gauze Sponge, Non-Sterile 4x4 in 1 x Per Day/30 Days Discharge Instructions: Apply over primary dressing as directed. Secondary Dressing: ABD Pad, 8x10 1 x Per Day/30 Days Discharge Instructions: Apply over primary dressing as directed. Secured With: [employer], 4.5x3.1 (in/yd) 1 x Per Day/30 Days Discharge Instructions: Secure with Kerlix as directed. Secured With: Paper Tape, 2x10 (in/yd) 1 x Per Day/30 Days Discharge Instructions: Secure dressing with tape as directed. Radiology X-ray, foot right - x-ray of right foot related to non-healing pressure ulcer looking for infection. CPT code ICD10 code L89.610 Electronic Signature(s) Signed: 11/29/2021 11:49:46 AM By: [name] DO Entered By: [name] on 11/29/2021 11:44:02 Prescription  11/29/2021 -------------------------------------------------------------------------------- [name]. [name] DO Patient Name: Provider: 1966-06-19 [id number] Date of Birth: NPI#: [id number] Sex: DEA #: [phone] Phone #: License #: [place] Patient Address: [place] [address] [place], [place] [address] [place],  [address] [phone] Allergies No Known Allergies Provider's Orders X-ray, foot right - x-ray of right foot related to non-healing pressure ulcer looking for infection. CPT code ICD10 code L89.610 Hand Signature: Date(s): Electronic Signature(s) Signed: 11/29/2021 11:49:46 AM By: [name] DO Entered By: [name] on 11/29/2021 11:44:02 -------------------------------------------------------------------------------- Problem List Details Patient Name: Date of Service: Jane Nurse M. 11/29/2021 9:30 A M Medical Record Number: [id number] Patient Account Number: [id number] Date of Birth/Sex: Treating RN: Mar 18, 1966 (56 y.o. [name], [name] Primary Care Provider: [name] Other Clinician: Referring Provider: Treating Provider/Extender: Carlus Pavlov in Treatment: 10 Active Problems ICD-10 Encounter Code Description Active Date MDM Diagnosis L89.613 Pressure ulcer of right heel, stage 3 09/19/2021 No Yes F25.9 Schizoaffective disorder, unspecified 09/19/2021 No Yes N18.6 End stage renal disease 09/19/2021 No Yes L89.610 Pressure ulcer of right heel, unstageable 10/03/2021 No Yes Inactive Problems Resolved Problems Electronic Signature(s) Signed: 11/29/2021 11:49:46 AM By: [name] DO Entered By: [name] on 11/29/2021 11:40:03 -------------------------------------------------------------------------------- Progress Note Details Patient Name: Date of Service: Jane Nurse M.  11/29/2021 9:30 A M Medical Record Number: [id number] Patient Account Number: [id number] Date of Birth/Sex: Treating RN: 1966/03/16 (56 y.o. [name] Primary Care Provider: [name] Other Clinician: Referring Provider: Treating Provider/Extender: Carlus Pavlov in Treatment: 10 Subjective Chief Complaint Information obtained from Patient Right heel wound History of Present Illness (HPI) Admission 09/19/2021 Ms. Shekia Kuper is a 56 year old female with a past medical history of schizoaffective disorder, end-stage renal disease on peritoneal dialysis and seizure disorder that presents to the clinic for a right heel wound that has been present since her hospitalization in April 2022 because of an epidural abscess that required surgery. During her hospitalization she developed the heel wound and a breast wound. She would lean more aggressively to the left side for long periods of time. The breast wound has healed. The right heel Has developed a loose callus. She is not mobile and is either in her wheelchair or in the bed most of the day. She currently denies pain, increased warmth or erythema to the area or drainage. Son-in-law is present and helps with the history. Patient is unable to  participate fully in her care due to her diagnosis of schizoaffective disorder. 10/03/2021; patient presents for follow-up. She has developed a new pressure injury to the calcaneus and has skin breakdown to the surrounding area. She has been using Hydrofera Blue to the previous wound site. She has no issues or complaints today. She denies signs of infection. 12/19; patient presents for follow-up. She has been using Hydrofera Blue to the wound sites. She went back and obtained TBI's. She currently denies signs of infection. 1/3; patient presents for follow-up. She has been using Hydrofera Blue to the medial wound and Santyl to the calcaneus wound. She has no issues  or complaints today. She denies signs of infection. 1/17; patient presents for follow-up. She has been using Hydrofera Blue to the medial wound along with Santyl. She been using Santyl to the calcaneus wound. She has no issues or complaints today. She denies signs of infection. 1/31; patient presents for follow-up. The medial wound remains closed. She has been using Santyl to the calcaneus wound. She has no issues or complaints today. She denies signs of infection. 2/7; patient presents for follow-up. She has been using Santyl to the calcaneus wound. The medial wound remains closed but does have callus surrounding this. She is wearing her Prevalon boots however takes these off pretty regularly. She currently denies signs of infection. Patient History Information obtained from Patient, Caregiver. Family History Cancer - Mother, Kidney Disease - Maternal Grandparents, No family history of Diabetes, Heart Disease, Hereditary Spherocytosis, Hypertension, Lung Disease, Seizures, Stroke, Thyroid Problems, Tuberculosis. Social History Never smoker, Marital Status - Single, Alcohol Use - Never, Drug Use - No History, Caffeine Use - Rarely. Medical History Eyes Patient has history of Cataracts Hematologic/Lymphatic Patient has history of Anemia Cardiovascular Patient has history of Congestive Heart Failure, Hypotension Denies history of Hypertension Genitourinary Patient has history of End Stage Renal Disease - Dialysis Neurologic Patient has history of Seizure Disorder - last seizure several years ago Medical A Surgical History Notes nd Endocrine Hypothyroidism Musculoskeletal Laminectomy 05/2021 Psychiatric Schizoaffective Disorder Objective Constitutional respirations regular, non-labored and within target range for patient.. Vitals Time Taken: 9:40 AM, Temperature: 98.7 F, Pulse: 61 bpm, Respiratory Rate: 18 breaths/min, Blood Pressure: 111/80 mmHg. Cardiovascular 2+ dorsalis  pedis/posterior tibialis pulses. Psychiatric pleasant and cooperative. General Notes: T the right heel there is an open wound with nonviable tissue throughout. No surrounding signs of infection. Previous wound site continues to o have a scab and callus. This was pared down with no open wound. Integumentary (Hair, Skin) Wound #2 status is Open. Original cause of wound was Pressure Injury. The date acquired was: 10/03/2021. The wound has been in treatment 8 weeks. The wound is located on the Right Calcaneus. The wound measures 1.7cm length x 1cm width x 0.5cm depth; 1.335cm^2 area and 0.668cm^3 volume. There is Fat Layer (Subcutaneous Tissue) exposed. There is no tunneling noted, however, there is undermining starting at 12:00 and ending at 12:00 with a maximum distance of 0.5cm. There is a medium amount of serosanguineous drainage noted. The wound margin is distinct with the outline attached to the wound base. There is small (1-33%) red granulation within the wound bed. There is a large (67-100%) amount of necrotic tissue within the wound bed including Adherent Slough. Assessment Active Problems ICD-10 Pressure ulcer of right heel, stage 3 Schizoaffective disorder, unspecified End stage renal disease Pressure ulcer of right heel, unstageable Patient's wound is stable. I debrided nonviable tissue. No signs of surrounding infection. I recommended doing  a repeat x-ray of the right foot T assure there o is no osseous abnormality as I cannot tell how deep this wound goes. Follow-up in 1 week. Procedures Wound #2 Pre-procedure diagnosis of Wound #2 is a Pressure Ulcer located on the Right Calcaneus . There was a Selective/Open Wound Non-Viable Tissue Debridement with a total area of 1.7 sq cm performed by [name], DO. With the following instrument(s): Curette, Forceps, and Scissors to remove Viable and Non-Viable tissue/material. Material removed includes [place] after achieving pain  control using Lidocaine 5% topical ointment. A time out was conducted at 10:00, prior to the start of the procedure. A Minimum amount of bleeding was controlled with Pressure. The procedure was tolerated well with a pain level of 0 throughout and a pain level of 0 following the procedure. Post Debridement Measurements: 1.7cm length x 1cm width x 0.5cm depth; 0.668cm^3 volume. Post debridement Stage noted as Unstageable/Unclassified. Character of Wound/Ulcer Post Debridement requires further debridement. Post procedure Diagnosis Wound #2: Same as Pre-Procedure A Paring/cutting of benign hyperkeratotic lesion 2-4 procedure was performed. by [name], DO. Post procedure Diagnosis Wound #: Same as Pre-Procedure Notes: Parring of a callous to right medical callous using a size 3 curette. Plan Follow-up Appointments: Return Appointment in 1 week. - Dr. Heber Groom and [name], RN Other: - X-ray outpatient at any [facility] facility. Bathing/ Shower/ Hygiene: May shower and wash wound with soap and water. - when changing dressing Edema Control - Lymphedema / SCD / Other: Elevate legs to the level of the heart or above for 30 minutes daily and/or when sitting, a frequency of: - 3-4 times a day throughout the day. Off-Loading: Other: - Prevalon Boot to right foot, avoid direct pressure to back of heels. Additional Orders / Instructions: Follow Nutritious Diet Radiology ordered were: X-ray, foot right - x-ray of right foot related to non-healing pressure ulcer looking for infection. CPT code ICD10 code L89.610 WOUND #2: - Calcaneus Wound Laterality: Right Cleanser: Soap and Water 1 x Per Day/30 Days Discharge Instructions: May shower and wash wound with dial antibacterial soap and water prior to dressing change. Peri-Wound Care: Zinc Oxide Ointment 30g tube 1 x Per Day/30 Days Discharge Instructions: Apply Zinc Oxide around the wound bed with each dressing change Prim Dressing: Santyl Ointment 1  x Per Day/30 Days ary Discharge Instructions: Apply nickel thick amount to wound bed as instructed Secondary Dressing: Woven Gauze Sponge, Non-Sterile 4x4 in 1 x Per Day/30 Days Discharge Instructions: Apply over primary dressing as directed. Secondary Dressing: ABD Pad, 8x10 1 x Per Day/30 Days Discharge Instructions: Apply over primary dressing as directed. Secured With: [employer], 4.5x3.1 (in/yd) 1 x Per Day/30 Days Discharge Instructions: Secure with Kerlix as directed. Secured With: Paper T ape, 2x10 (in/yd) 1 x Per Day/30 Days Discharge Instructions: Secure dressing with tape as directed. 1. In office sharp debridement 2. Right foot x-ray 3. Follow-up in 1 week Electronic Signature(s) Signed: 11/29/2021 11:49:46 AM By: [name] DO Entered By: [name] on 11/29/2021 11:48:29 -------------------------------------------------------------------------------- HxROS Details Patient Name: Date of Service: Jane Nurse M. 11/29/2021 9:30 A M Medical Record Number: [id number] Patient Account Number: [id number] Date of Birth/Sex: Treating RN: 04-17-1966 (56 y.o. [name] Primary Care Provider: [name] Other Clinician: Referring Provider: Treating Provider/Extender: Carlus Pavlov in Treatment: 10 Information Obtained From Patient Caregiver Eyes Medical History: Positive for: Cataracts Hematologic/Lymphatic Medical History: Positive for: Anemia Cardiovascular Medical History: Positive for: Congestive Heart Failure;  Hypotension Negative for: Hypertension Endocrine Medical History: Past Medical History Notes: Hypothyroidism Genitourinary Medical History: Positive for: End Stage Renal Disease - Dialysis Musculoskeletal Medical History: Past Medical History Notes: Laminectomy 05/2021 Neurologic Medical History: Positive for: Seizure Disorder - last seizure several years ago Psychiatric Medical  History: Past Medical History Notes: Schizoaffective Disorder HBO Extended History Items Eyes: Cataracts Immunizations Pneumococcal Vaccine: Received Pneumococcal Vaccination: No Implantable Devices Yes Family and Social History Cancer: Yes - Mother; Diabetes: No; Heart Disease: No; Hereditary Spherocytosis: No; Hypertension: No; Kidney Disease: Yes - Maternal Grandparents; Lung Disease: No; Seizures: No; Stroke: No; Thyroid Problems: No; Tuberculosis: No; Never smoker; Marital Status - Single; Alcohol Use: Never; Drug Use: No History; Caffeine Use: Rarely; Financial Concerns: No; Food, Clothing or Shelter Needs: No; Support System Lacking: No; Transportation Concerns: No Electronic Signature(s) Signed: 11/29/2021 11:49:46 AM By: [name] DO Signed: 11/29/2021 6:07:56 PM By: Deon Pilling RN, BSN Entered By: [name] on 11/29/2021 11:43:07 -------------------------------------------------------------------------------- SuperBill Details Patient Name: Date of Service: Samule Dry. 11/29/2021 Medical Record Number: [id number] Patient Account Number: [id number] Date of Birth/Sex: Treating RN: [birth year] (56 y.o. [name], [name] Primary Care Provider: [name] Other Clinician: Referring Provider: Treating Provider/Extender: Carlus Pavlov in Treatment: 10 Diagnosis Coding ICD-10 Codes Code Description [phone] Pressure ulcer of right heel, stage 3 F25.9 Schizoaffective disorder, unspecified N18.6 End stage renal disease L89.610 Pressure ulcer of right heel, unstageable Facility Procedures CPT4 Code: [id number] Description: 39767 - DEBRIDE WOUND 1ST 20 SQ CM OR < ICD-10 Diagnosis Description L89.610 Pressure ulcer of right heel, unstageable Modifier: Quantity: 1 Physician Procedures : CPT4 Code Description Modifier [id number] - WC PHYS DEBR WO ANESTH 20 SQ CM ICD-10 Diagnosis Description L89.610 Pressure ulcer of right  heel, unstageable Quantity: 1 Electronic Signature(s) Signed: 11/29/2021 11:49:46 AM By: [name] DO Entered By: [name] on 11/29/2021 11:48:40

## 2021-11-30 DIAGNOSIS — D631 Anemia in chronic kidney disease: Secondary | ICD-10-CM | POA: Diagnosis not present

## 2021-11-30 DIAGNOSIS — N186 End stage renal disease: Secondary | ICD-10-CM | POA: Diagnosis not present

## 2021-11-30 DIAGNOSIS — N2581 Secondary hyperparathyroidism of renal origin: Secondary | ICD-10-CM | POA: Diagnosis not present

## 2021-11-30 NOTE — Progress Notes (Signed)
GREIDYS, DELAND ([id number]) Visit Report for 11/29/2021 Arrival Information Details Patient Name: Date of Service: MERCEDE, ROLLO. 11/29/2021 9:30 A M Medical Record Number: [id number] Patient Account Number: [id number] Date of Birth/Sex: Treating RN: Oct 27, 1965 (56 y.o. [name], [name] Primary Care Demetri Kerman: [name] Other Clinician: Referring Briauna Gilmartin: Treating Clare Casto/Extender: Carlus Pavlov in Treatment: 10 Visit Information History Since Last Visit Added or deleted any medications: No Patient Arrived: Wheel Chair Any new allergies or adverse reactions: No Arrival Time: 09:38 Had a fall or experienced change in No Accompanied By: brother activities of daily living that may affect Transfer Assistance: Manual risk of falls: Patient Requires Transmission-Based Precautions: No Signs or symptoms of abuse/neglect since last visito No Patient Has Alerts: Yes Hospitalized since last visit: No Patient Alerts: R ABI=Non Comp Implantable device outside of the clinic excluding No cellular tissue based products placed in the center since last visit: Has Dressing in Place as Prescribed: Yes Pain Present Now: No Electronic Signature(s) Signed: 11/30/2021 2:07:25 PM By: Sandre Kitty Entered By: Sandre Kitty on 11/29/2021 09:47:15 -------------------------------------------------------------------------------- Lower Extremity Assessment Details Patient Name: Date of Service: DORRAINE, ELLENDER. 11/29/2021 9:30 A M Medical Record Number: [id number] Patient Account Number: [id number] Date of Birth/Sex: Treating RN: [birth year] (56 y.o. [name] Primary Care Teofila Bowery: [name] Other Clinician: Referring Jule Schlabach: Treating Vasilios Ottaway/Extender: Carlus Pavlov in Treatment: 10 Edema Assessment Assessed: [name]: No] Patrice Paradise: Yes] Edema: [Left: Ye] [Right: s] Calf Left: Right: Point of Measurement:  30 cm From Medial Instep 33.5 cm Ankle Left: Right: Point of Measurement: 7 cm From Medial Instep 23.5 cm Vascular Assessment Pulses: Dorsalis Pedis Palpable: [Right:Yes] Electronic Signature(s) Signed: 11/29/2021 6:07:56 PM By: Deon Pilling RN, BSN Entered By: Deon Pilling on 11/29/2021 09:50:06 -------------------------------------------------------------------------------- Multi Wound Chart Details Patient Name: Date of Service: [name] 11/29/2021 9:30 A M Medical Record Number: [id number] Patient Account Number: [id number] Date of Birth/Sex: Treating RN: [birth year] (56 y.o. [name], [name] Primary Care Cindy Fullman: [name] Other Clinician: Referring Haywood Meinders: Treating Whitman Meinhardt/Extender: Carlus Pavlov in Treatment: 10 Vital Signs Height(in): Pulse(bpm): 61 Weight(lbs): Blood Pressure(mmHg): 111/80 Body Mass Index(BMI): Temperature(F): 98.7 Respiratory Rate(breaths/min): 18 Photos: [N/A:N/A] Right Calcaneus N/A N/A Wound Location: Pressure Injury N/A N/A Wounding Event: Pressure Ulcer N/A N/A Primary Etiology: Cataracts, Anemia, Congestive Heart N/A N/A Comorbid History: Failure, Hypotension, End Stage Renal Disease, Seizure Disorder 10/03/2021 N/A N/A Date Acquired: 8 N/A N/A Weeks of Treatment: Open N/A N/A Wound Status: No N/A N/A Wound Recurrence: 1.7x1x0.5 N/A N/A Measurements L x W x D (cm) 1.335 N/A N/A A (cm) : rea 0.668 N/A N/A Volume (cm) : 74.70% N/A N/A % Reduction in A rea: -26.50% N/A N/A % Reduction in Volume: 12 Starting Position 1 (o'clock): 12 Ending Position 1 (o'clock): 0.5 Maximum Distance 1 (cm): Yes N/A N/A Undermining: Unstageable/Unclassified N/A N/A Classification: Medium N/A N/A Exudate A mount: Serosanguineous N/A N/A Exudate Type: red, brown N/A N/A Exudate Color: Distinct, outline attached N/A N/A Wound Margin: Small (1-33%) N/A N/A Granulation A  mount: Red N/A N/A Granulation Quality: Large (67-100%) N/A N/A Necrotic A mount: Fat Layer (Subcutaneous Tissue): Yes N/A N/A Exposed Structures: Fascia: No Tendon: No Muscle: No Joint: No Bone: No Small (1-33%) N/A N/A Epithelialization: Debridement - Selective/Open Wound N/A N/A Debridement: Pre-procedure Verification/Time Out 10:00 N/A N/A Taken: Lidocaine 5% topical ointment N/A N/A Pain Control: Slough N/A N/A Tissue Debrided: Non-Viable Tissue N/A N/A Level:  1.7 N/A N/A Debridement A (sq cm): rea Curette, Forceps, Scissors N/A N/A Instrument: Minimum N/A N/A Bleeding: Pressure N/A N/A Hemostasis A chieved: 0 N/A N/A Procedural Pain: 0 N/A N/A Post Procedural Pain: Procedure was tolerated well N/A N/A Debridement Treatment Response: 1.7x1x0.5 N/A N/A Post Debridement Measurements L x W x D (cm) 0.668 N/A N/A Post Debridement Volume: (cm) Unstageable/Unclassified N/A N/A Post Debridement Stage: Debridement N/A N/A Procedures Performed: Treatment Notes Electronic Signature(s) Signed: 11/29/2021 11:49:46 AM By: [name] DO Signed: 11/29/2021 6:07:56 PM By: Deon Pilling RN, BSN Entered By: [name] on 11/29/2021 11:40:08 -------------------------------------------------------------------------------- Multi-Disciplinary Care Plan Details Patient Name: Date of Service: [name] 11/29/2021 9:30 A M Medical Record Number: [id number] Patient Account Number: [id number] Date of Birth/Sex: Treating RN: 06/01/1966 (56 y.o. [name], [name] Primary Care Yusef Lamp: [name] Other Clinician: Referring Graysen Woodyard: Treating Jadira Nierman/Extender: Carlus Pavlov in Treatment: 10 Active Inactive Pressure Nursing Diagnoses: Knowledge deficit related to causes and risk factors for pressure ulcer development Goals: Patient will remain free from development of additional pressure ulcers Date Initiated:  09/19/2021 Target Resolution Date: 12/16/2021 Goal Status: Active Interventions: Assess: immobility, friction, shearing, incontinence upon admission and as needed Assess offloading mechanisms upon admission and as needed Treatment Activities: Pressure reduction/relief device ordered : 09/19/2021 T ordered outside of clinic : 09/19/2021 est Notes: Wound/Skin Impairment Nursing Diagnoses: Impaired tissue integrity Goals: Patient/caregiver will verbalize understanding of skin care regimen Date Initiated: 09/19/2021 Target Resolution Date: 12/16/2021 Goal Status: Active Ulcer/skin breakdown will have a volume reduction of 30% by week 4 Date Initiated: 09/19/2021 Date Inactivated: 10/25/2021 Target Resolution Date: 10/17/2021 Goal Status: Met Ulcer/skin breakdown will have a volume reduction of 50% by week 8 Date Initiated: 10/25/2021 Target Resolution Date: 12/16/2021 Goal Status: Active Interventions: Assess patient/caregiver ability to obtain necessary supplies Assess patient/caregiver ability to perform ulcer/skin care regimen upon admission and as needed Assess ulceration(s) every visit Provide education on ulcer and skin care Treatment Activities: Topical wound management initiated : 09/19/2021 Notes: Electronic Signature(s) Signed: 11/29/2021 6:07:56 PM By: Deon Pilling RN, BSN Entered By: Deon Pilling on 11/29/2021 10:00:07 -------------------------------------------------------------------------------- Pain Assessment Details Patient Name: Date of Service: [name] 11/29/2021 9:30 A M Medical Record Number: [id number] Patient Account Number: [id number] Date of Birth/Sex: Treating RN: Feb 01, 1966 (56 y.o. [name], [name] Primary Care Adrienna Karis: [name] Other Clinician: Referring Gurtej Noyola: Treating Tita Terhaar/Extender: Carlus Pavlov in Treatment: 10 Active Problems Location of Pain Severity and Description of Pain Patient  Has Paino No Site Locations Pain Management and Medication Current Pain Management: Electronic Signature(s) Signed: 11/29/2021 6:07:56 PM By: Deon Pilling RN, BSN Signed: 11/30/2021 2:07:25 PM By: Sandre Kitty Entered By: Sandre Kitty on 11/29/2021 09:41:15 -------------------------------------------------------------------------------- Patient/Caregiver Education Details Patient Name: Date of Service: Samule Dry 2/7/2023andnbsp9:30 [place] Record Number: [id number] Patient Account Number: [id number] Date of Birth/Gender: Treating RN: 16-Sep-1966 (56 y.o. [name] Primary Care Physician: [name] Other Clinician: Referring Physician: Treating Physician/Extender: Carlus Pavlov in Treatment: 10 Education Assessment Education Provided To: Patient and Caregiver Education Topics Provided Wound/Skin Impairment: Handouts: Skin Care Do's and Dont's Methods: Explain/Verbal Responses: Reinforcements needed Electronic Signature(s) Signed: 11/29/2021 6:07:56 PM By: Deon Pilling RN, BSN Entered By: Deon Pilling on 11/29/2021 10:00:21 -------------------------------------------------------------------------------- Wound Assessment Details Patient Name: Date of Service: [name] 11/29/2021 9:30 A M Medical Record Number: [id number] Patient Account Number: [id number] Date of Birth/Sex: Treating RN: June 17, 1966 (  56 y.o. [name], [name] Primary Care Inette Doubrava: [name] Other Clinician: Referring Aissa Lisowski: Treating Taher Vannote/Extender: Carlus Pavlov in Treatment: 10 Wound Status Wound Number: 2 Primary Pressure Ulcer Etiology: Wound Location: Right Calcaneus Wound Open Wounding Event: Pressure Injury Status: Date Acquired: 10/03/2021 Comorbid Cataracts, Anemia, Congestive Heart Failure, Hypotension, End Weeks Of Treatment: 8 History: Stage Renal Disease, Seizure  Disorder Clustered Wound: No Photos Wound Measurements Length: (cm) 1.7 Width: (cm) 1 Depth: (cm) 0.5 Area: (cm) 1.335 Volume: (cm) 0.668 % Reduction in Area: 74.7% % Reduction in Volume: -26.5% Epithelialization: Small (1-33%) Tunneling: No Undermining: Yes Starting Position (o'clock): 12 Ending Position (o'clock): 12 Maximum Distance: (cm) 0.5 Wound Description Classification: Unstageable/Unclassified Wound Margin: Distinct, outline attached Exudate Amount: Medium Exudate Type: Serosanguineous Exudate Color: red, brown Foul Odor After Cleansing: No Slough/Fibrino Yes Wound Bed Granulation Amount: Small (1-33%) Exposed Structure Granulation Quality: Red Fascia Exposed: No Necrotic Amount: Large (67-100%) Fat Layer (Subcutaneous Tissue) Exposed: Yes Necrotic Quality: Adherent Slough Tendon Exposed: No Muscle Exposed: No Joint Exposed: No Bone Exposed: No Electronic Signature(s) Signed: 11/29/2021 6:07:56 PM By: Deon Pilling RN, BSN Signed: 11/30/2021 2:07:25 PM By: Sandre Kitty Entered By: Sandre Kitty on 11/29/2021 09:51:05 -------------------------------------------------------------------------------- Vitals Details Patient Name: Date of Service: [name] 11/29/2021 9:30 A M Medical Record Number: [id number] Patient Account Number: [id number] Date of Birth/Sex: Treating RN: 02-10-1966 (56 y.o. [name] Primary Care Seraphine Gudiel: [name] Other Clinician: Referring Juno Alers: Treating Shaunta Oncale/Extender: Carlus Pavlov in Treatment: 10 Vital Signs Time Taken: 09:40 Temperature (F): 98.7 Pulse (bpm): 61 Respiratory Rate (breaths/min): 18 Blood Pressure (mmHg): 111/80 Reference Range: 80 - 120 mg / dl Electronic Signature(s) Signed: 11/30/2021 2:07:25 PM By: Sandre Kitty Entered By: Sandre Kitty on 11/29/2021 09:41:08

## 2021-12-01 DIAGNOSIS — N186 End stage renal disease: Secondary | ICD-10-CM | POA: Diagnosis not present

## 2021-12-01 DIAGNOSIS — D631 Anemia in chronic kidney disease: Secondary | ICD-10-CM | POA: Diagnosis not present

## 2021-12-01 DIAGNOSIS — N2581 Secondary hyperparathyroidism of renal origin: Secondary | ICD-10-CM | POA: Diagnosis not present

## 2021-12-02 DIAGNOSIS — N186 End stage renal disease: Secondary | ICD-10-CM | POA: Diagnosis not present

## 2021-12-02 DIAGNOSIS — N2581 Secondary hyperparathyroidism of renal origin: Secondary | ICD-10-CM | POA: Diagnosis not present

## 2021-12-02 DIAGNOSIS — D631 Anemia in chronic kidney disease: Secondary | ICD-10-CM | POA: Diagnosis not present

## 2021-12-03 DIAGNOSIS — N186 End stage renal disease: Secondary | ICD-10-CM | POA: Diagnosis not present

## 2021-12-03 DIAGNOSIS — D631 Anemia in chronic kidney disease: Secondary | ICD-10-CM | POA: Diagnosis not present

## 2021-12-03 DIAGNOSIS — N2581 Secondary hyperparathyroidism of renal origin: Secondary | ICD-10-CM | POA: Diagnosis not present

## 2021-12-04 DIAGNOSIS — N186 End stage renal disease: Secondary | ICD-10-CM | POA: Diagnosis not present

## 2021-12-04 DIAGNOSIS — D631 Anemia in chronic kidney disease: Secondary | ICD-10-CM | POA: Diagnosis not present

## 2021-12-04 DIAGNOSIS — N2581 Secondary hyperparathyroidism of renal origin: Secondary | ICD-10-CM | POA: Diagnosis not present

## 2021-12-05 DIAGNOSIS — I5020 Unspecified systolic (congestive) heart failure: Secondary | ICD-10-CM | POA: Diagnosis not present

## 2021-12-05 DIAGNOSIS — N2581 Secondary hyperparathyroidism of renal origin: Secondary | ICD-10-CM | POA: Diagnosis not present

## 2021-12-05 DIAGNOSIS — R627 Adult failure to thrive: Secondary | ICD-10-CM | POA: Diagnosis not present

## 2021-12-05 DIAGNOSIS — D631 Anemia in chronic kidney disease: Secondary | ICD-10-CM | POA: Diagnosis not present

## 2021-12-05 DIAGNOSIS — I959 Hypotension, unspecified: Secondary | ICD-10-CM | POA: Diagnosis not present

## 2021-12-05 DIAGNOSIS — L89613 Pressure ulcer of right heel, stage 3: Secondary | ICD-10-CM | POA: Diagnosis not present

## 2021-12-05 DIAGNOSIS — N186 End stage renal disease: Secondary | ICD-10-CM | POA: Diagnosis not present

## 2021-12-06 ENCOUNTER — Encounter (HOSPITAL_BASED_OUTPATIENT_CLINIC_OR_DEPARTMENT_OTHER): Payer: Medicare Other | Admitting: Internal Medicine

## 2021-12-06 DIAGNOSIS — N186 End stage renal disease: Secondary | ICD-10-CM | POA: Diagnosis not present

## 2021-12-06 DIAGNOSIS — D631 Anemia in chronic kidney disease: Secondary | ICD-10-CM | POA: Diagnosis not present

## 2021-12-06 DIAGNOSIS — N2581 Secondary hyperparathyroidism of renal origin: Secondary | ICD-10-CM | POA: Diagnosis not present

## 2021-12-06 IMAGING — DX DG ABDOMEN 1V
2 series · 2 of 2 positions shown · non-contrast
Comparison: 07/31/2021

CLINICAL DATA: Ileus

EXAM:
ABDOMEN - 1 VIEW

[abdomen supine (1 of 2)]
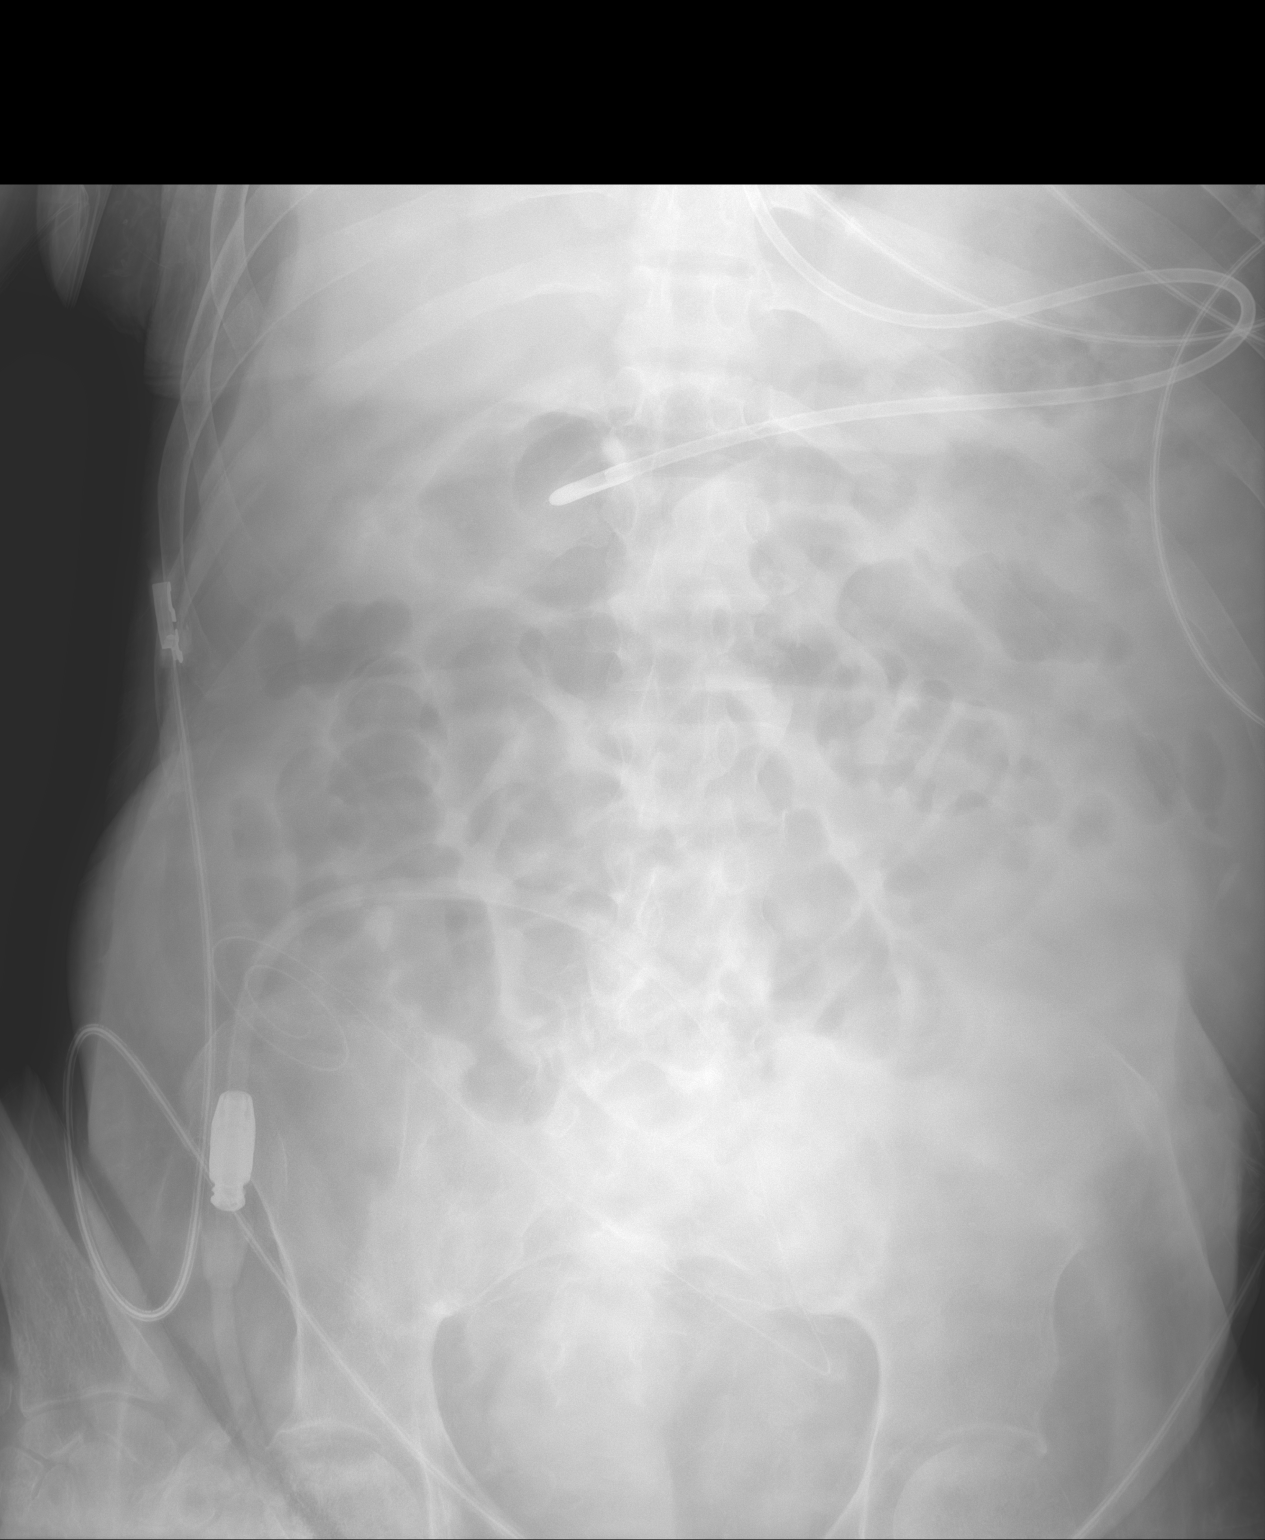

[abdomen supine (2 of 2)]
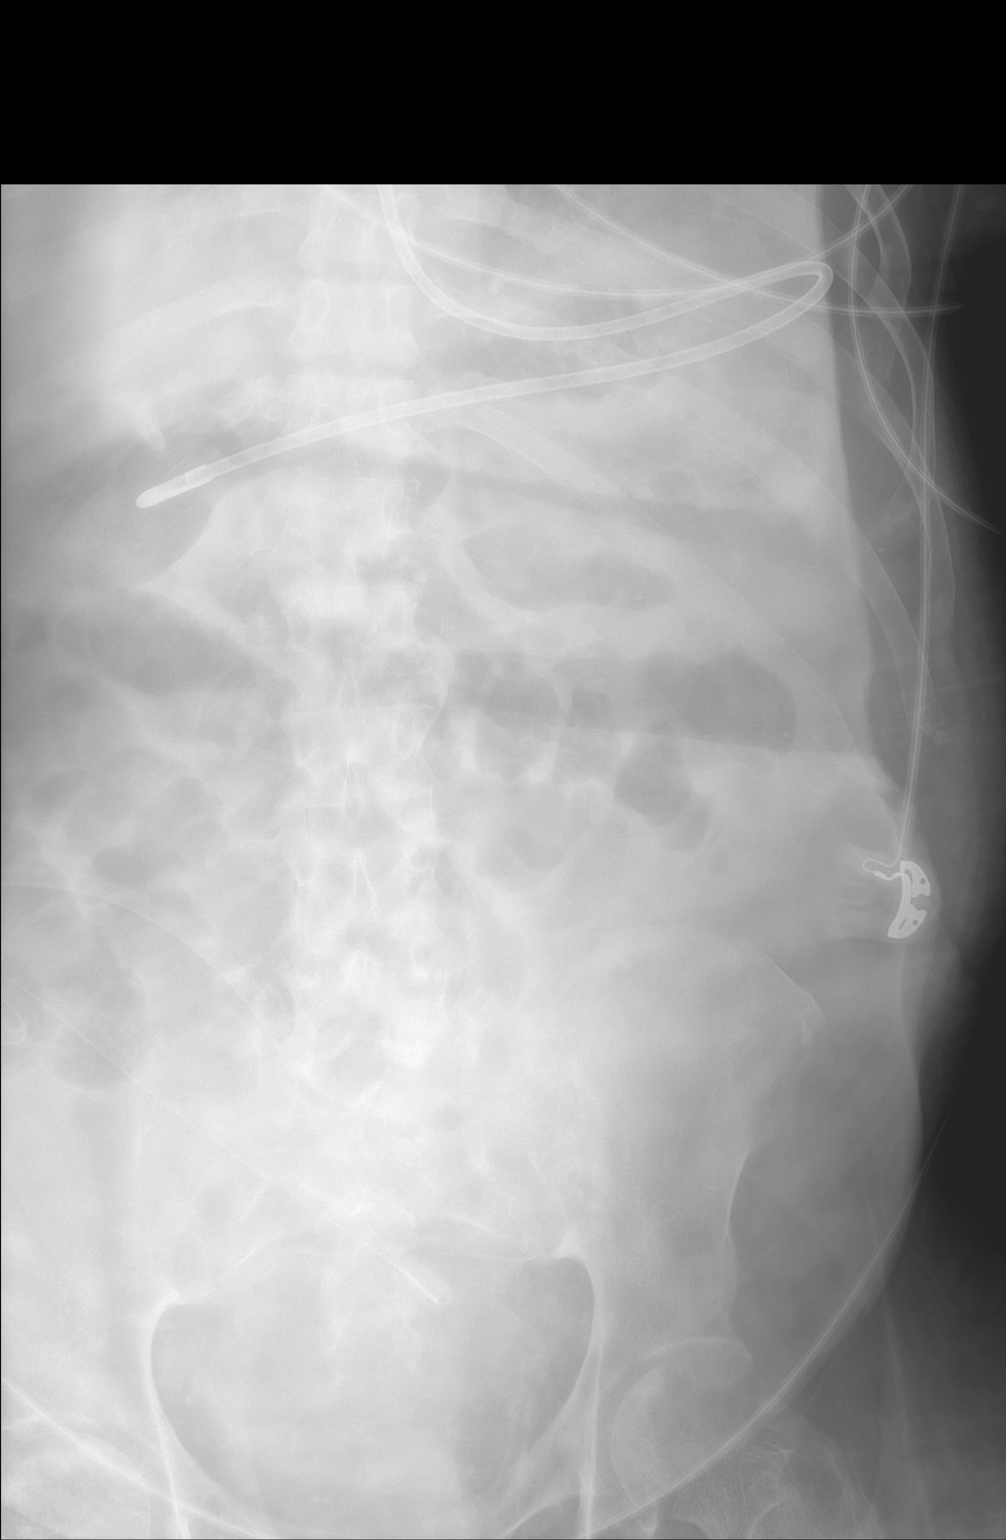

[2 of 2 positions shown; findings below may reference images not displayed]

FINDINGS: Enteric tube terminates in the distal gastric antrum/proximal
duodenum.

Multiple dilated loops of small bowel in the central abdomen,
possibly reflecting small bowel ileus, partial small bowel
obstruction not excluded. This is unchanged.
IMPRESSION: Enteric tube terminates in the distal gastric antrum/proximal
duodenum.

Stable dilated loops of small bowel, possibly reflecting adynamic
small bowel ileus, partial small bowel obstruction not excluded.

## 2021-12-07 ENCOUNTER — Other Ambulatory Visit: Payer: Self-pay

## 2021-12-07 ENCOUNTER — Emergency Department (HOSPITAL_COMMUNITY)
Admission: EM | Admit: 2021-12-07 | Discharge: 2021-12-08 | Disposition: A | Discharge status: Home / Self Care | Payer: Medicare Other | Attending: Emergency Medicine | Admitting: Emergency Medicine

## 2021-12-07 ENCOUNTER — Encounter (HOSPITAL_COMMUNITY): Payer: Self-pay | Admitting: *Deleted

## 2021-12-07 DIAGNOSIS — D75839 Thrombocytosis, unspecified: Secondary | ICD-10-CM | POA: Insufficient documentation

## 2021-12-07 DIAGNOSIS — I120 Hypertensive chronic kidney disease with stage 5 chronic kidney disease or end stage renal disease: Secondary | ICD-10-CM | POA: Diagnosis not present

## 2021-12-07 DIAGNOSIS — Z992 Dependence on renal dialysis: Secondary | ICD-10-CM | POA: Diagnosis not present

## 2021-12-07 DIAGNOSIS — N2581 Secondary hyperparathyroidism of renal origin: Secondary | ICD-10-CM | POA: Diagnosis not present

## 2021-12-07 DIAGNOSIS — A0472 Enterocolitis due to Clostridium difficile, not specified as recurrent: Secondary | ICD-10-CM | POA: Insufficient documentation

## 2021-12-07 DIAGNOSIS — E876 Hypokalemia: Secondary | ICD-10-CM | POA: Diagnosis not present

## 2021-12-07 DIAGNOSIS — N186 End stage renal disease: Secondary | ICD-10-CM | POA: Insufficient documentation

## 2021-12-07 DIAGNOSIS — D631 Anemia in chronic kidney disease: Secondary | ICD-10-CM | POA: Insufficient documentation

## 2021-12-07 DIAGNOSIS — R112 Nausea with vomiting, unspecified: Secondary | ICD-10-CM | POA: Diagnosis present

## 2021-12-07 DIAGNOSIS — R748 Abnormal levels of other serum enzymes: Secondary | ICD-10-CM | POA: Insufficient documentation

## 2021-12-07 DIAGNOSIS — E871 Hypo-osmolality and hyponatremia: Secondary | ICD-10-CM | POA: Diagnosis not present

## 2021-12-07 NOTE — ED Provider Triage Note (Signed)
Emergency Medicine Provider Triage Evaluation Note  Jane [name] , a 56 y.o. female  was evaluated in triage.  Pt complains of abodminal pain, diarrhea, NBNB emesis for the last four days. Presents with her brother in law and sister, who are her care takers. Peritoneal dialysis 4x daily at home.  Review of Systems  Positive: N/V/D, abdominal pain Negative: Melena, hematochezia  Physical Exam  BP 113/86    Pulse (!) 103    Temp 98.9 F (37.2 C) (Oral)    Resp 18    SpO2 99%  Gen:   Awake, no distress   Resp:  Normal effort  MSK:   Moves extremities without difficulty  Other:  Peritoneal dialysis catheter in the abdomen. Abdomen soft, nondistended, nontender.   Medical Decision Making  Medically screening exam initiated at 11:25 PM.  Appropriate orders placed.  Jevon KESIA DALTO was informed that the remainder of the evaluation will be completed by another provider, this initial triage assessment does not replace that evaluation, and the importance of remaining in the ED until their evaluation is complete.  This chart was dictated using voice recognition software, Dragon. Despite the best efforts of this provider to proofread and correct errors, errors may still occur which can change documentation meaning.    Emeline Darling, PA-C 12/07/21 2326

## 2021-12-07 NOTE — ED Triage Notes (Signed)
C/o vomiting and diarrhea for a week, no fevers. Denies pain

## 2021-12-08 DIAGNOSIS — E871 Hypo-osmolality and hyponatremia: Secondary | ICD-10-CM | POA: Diagnosis not present

## 2021-12-08 DIAGNOSIS — N186 End stage renal disease: Secondary | ICD-10-CM | POA: Diagnosis not present

## 2021-12-08 DIAGNOSIS — D631 Anemia in chronic kidney disease: Secondary | ICD-10-CM | POA: Diagnosis not present

## 2021-12-08 DIAGNOSIS — N2581 Secondary hyperparathyroidism of renal origin: Secondary | ICD-10-CM | POA: Diagnosis not present

## 2021-12-08 LAB — COMPREHENSIVE METABOLIC PANEL
ALT: 9 U/L (ref 0–44)
AST: 13 U/L — ABNORMAL LOW (ref 15–41)
Albumin: 2 g/dL — ABNORMAL LOW (ref 3.5–5.0)
Alkaline Phosphatase: 245 U/L — ABNORMAL HIGH (ref 38–126)
Anion gap: 12 (ref 5–15)
BUN: 20 mg/dL (ref 6–20)
CO2: 26 mmol/L (ref 22–32)
Calcium: 7.2 mg/dL — ABNORMAL LOW (ref 8.9–10.3)
Chloride: 90 mmol/L — ABNORMAL LOW (ref 98–111)
Creatinine, Ser: 4.86 mg/dL — ABNORMAL HIGH (ref 0.44–1.00)
GFR, Estimated: 10 mL/min — ABNORMAL LOW (ref >60)
Glucose, Bld: 85 mg/dL (ref 70–99)
Potassium: 2.6 mmol/L — CL (ref 3.5–5.1)
Sodium: 128 mmol/L — ABNORMAL LOW (ref 135–145)
Total Bilirubin: 0.4 mg/dL (ref 0.3–1.2)
Total Protein: 7 g/dL (ref 6.5–8.1)

## 2021-12-08 LAB — CBC
HCT: 37.1 % (ref 36.0–46.0)
Hemoglobin: 11.7 g/dL — ABNORMAL LOW (ref 12.0–15.0)
MCH: 30.5 pg (ref 26.0–34.0)
MCHC: 31.5 g/dL (ref 30.0–36.0)
MCV: 96.6 fL (ref 80.0–100.0)
Platelets: 450 10*3/uL — ABNORMAL HIGH (ref 150–400)
RBC: 3.84 MIL/uL — ABNORMAL LOW (ref 3.87–5.11)
RDW: 16.1 % — ABNORMAL HIGH (ref 11.5–15.5)
WBC: 7.4 10*3/uL (ref 4.0–10.5)
nRBC: 0 % (ref 0.0–0.2)

## 2021-12-08 LAB — C DIFFICILE QUICK SCREEN W PCR REFLEX
C Diff antigen: POSITIVE — AB
C Diff toxin: NEGATIVE

## 2021-12-08 LAB — LIPASE, BLOOD: Lipase: 21 U/L (ref 11–51)

## 2021-12-08 LAB — CLOSTRIDIUM DIFFICILE BY PCR, REFLEXED: Toxigenic C. Difficile by PCR: NEGATIVE

## 2021-12-08 LAB — MAGNESIUM: Magnesium: 1.5 mg/dL — ABNORMAL LOW (ref 1.7–2.4)

## 2021-12-08 MED ORDER — POTASSIUM CHLORIDE CRYS ER 20 MEQ PO TBCR: 20.0000 meq | EXTENDED_RELEASE_TABLET | Freq: Every day | ORAL | 0 refills | Status: AC

## 2021-12-08 MED ORDER — VANCOMYCIN 50 MG/ML ORAL SOLUTION: 125.0000 mg | Freq: Four times a day (QID) | ORAL | 0 refills | Status: DC

## 2021-12-08 MED ORDER — LOPERAMIDE HCL 2 MG PO CAPS
4.0000 mg | ORAL_CAPSULE | Freq: Once | ORAL | Status: AC
  Administered 2021-12-08: 4 mg via ORAL
  Filled 2021-12-08: qty 2

## 2021-12-08 MED ORDER — POTASSIUM CHLORIDE 10 MEQ/100ML IV SOLN
10.0000 meq | INTRAVENOUS | Status: AC
  Administered 2021-12-08 (×2): 10 meq via INTRAVENOUS
  Filled 2021-12-08 (×2): qty 100

## 2021-12-08 MED ORDER — POTASSIUM CHLORIDE CRYS ER 20 MEQ PO TBCR
40.0000 meq | EXTENDED_RELEASE_TABLET | Freq: Once | ORAL | Status: AC
  Administered 2021-12-08: 40 meq via ORAL
  Filled 2021-12-08: qty 2

## 2021-12-08 MED ORDER — MAGNESIUM SULFATE 2 GM/50ML IV SOLN
2.0000 g | Freq: Once | INTRAVENOUS | Status: AC
  Administered 2021-12-08: 2 g via INTRAVENOUS
  Filled 2021-12-08: qty 50

## 2021-12-08 MED ORDER — ONDANSETRON HCL 4 MG/2ML IJ SOLN
4.0000 mg | Freq: Once | INTRAMUSCULAR | Status: AC
  Administered 2021-12-08: 4 mg via INTRAVENOUS
  Filled 2021-12-08: qty 2

## 2021-12-08 MED ORDER — SLOW-MAG 71.5-119 MG PO TBEC: 1.0000 | DELAYED_RELEASE_TABLET | Freq: Every day | ORAL | 0 refills | Status: DC

## 2021-12-08 MED ORDER — ONDANSETRON HCL 4 MG PO TABS: 4.0000 mg | ORAL_TABLET | Freq: Four times a day (QID) | ORAL | 0 refills | Status: DC | PRN

## 2021-12-08 NOTE — Discharge Instructions (Addendum)
Return if you start running a fever or if you start having severe abdominal pain.

## 2021-12-08 NOTE — ED Notes (Signed)
Patient's legal guardian contacted about d/c, no answer, HIPAA compliant voicemail left.

## 2021-12-08 NOTE — ED Notes (Signed)
Patient cleaned of incontinence of bowels. 

## 2021-12-08 NOTE — ED Notes (Signed)
Jerrye Beavers contacted to come pick patient up, no answer x 1.

## 2021-12-08 NOTE — ED Notes (Signed)
Pt cleaned up and new brief placed prior to d/c.

## 2021-12-08 NOTE — ED Provider Notes (Addendum)
[facility] DEPT Provider Note   CSN: [id number] Arrival date & time: 12/07/21  2304     History  Chief Complaint  Patient presents with   Emesis    Jane [name] is a 56 y.o. female.  The history is provided by a relative. The history is limited by a developmental delay.  Emesis She has history of hypertension, hyperlipidemia, end-stage renal disease on peritoneal dialysis, schizophrenia, mental retardation, seizures and comes in because of vomiting and diarrhea for the last week.  Emesis has been once or twice a day, diarrhea as often as 6 times a day.  Last episode of emesis was yesterday.  There has been no blood in the stool or emesis.  She has not run a fever or had any chills or sweats.  She has still been hungry in spite of vomiting and diarrhea.  She is not complaining of abdominal pain or back pain.  There have been no known sick contacts.  Family has treated her condition with giving her clear fluids.  She is tolerating Pedialyte.  Home Medications Prior to Admission medications   Medication Sig Start Date End Date Taking? Authorizing Provider  albuterol (PROVENTIL HFA;VENTOLIN HFA) 108 (90 Base) MCG/ACT inhaler Inhale 2 puffs into the lungs every 6 (six) hours as needed for wheezing or shortness of breath.    [provider]  B Complex-C-Folic Acid (DIALYVITE 433) 0.8 MG TABS Take 0.8 mg by mouth daily.     [provider]  benztropine (COGENTIN) 1 MG tablet Take 1 mg by mouth at bedtime.    [provider]  divalproex (DEPAKOTE) 500 MG DR tablet Take 500 mg by mouth 2 (two) times daily. 08/02/21   [provider]  Docusate Sodium (DSS) 100 MG CAPS Take 100 mg by mouth daily as needed (constipation).    [provider]  FLUoxetine (PROZAC) 20 MG capsule Take 20 mg by mouth daily. 07/15/20   [provider]  HYDROcodone-acetaminophen (NORCO/VICODIN) 5-325 MG tablet Take 1 tablet by mouth 4  (four) times daily as needed for moderate pain. 12/02/20   [provider]  iron polysaccharides (NIFEREX) 150 MG capsule Take 150 mg by mouth every other day.    [provider]  levETIRAcetam (KEPPRA) 500 MG tablet Take 1 tablet (500 mg total) by mouth 2 (two) times daily. 09/17/15   Charlynne Cousins, MD  levothyroxine (SYNTHROID, LEVOTHROID) 50 MCG tablet Take 50 mcg by mouth daily with breakfast. 10/05/17   [provider]  medroxyPROGESTERone (DEPO-PROVERA) 150 MG/ML injection Inject 150 mg into the muscle every 3 (three) months.    [provider]  midodrine (PROAMATINE) 10 MG tablet Place 1 tablet (10 mg total) into feeding tube 3 (three) times daily with meals. 09/11/21   Danford, Suann Larry, MD  Nutritional Supplements (FEEDING SUPPLEMENT, NEPRO CARB STEADY,) LIQD Take 237 mLs by mouth 2 (two) times daily between meals. 09/12/21   Danford, Suann Larry, MD  ondansetron (ZOFRAN) 4 MG tablet Take 1 tablet (4 mg total) by mouth every 6 (six) hours as needed for nausea. 09/11/21   Danford, Suann Larry, MD  potassium chloride SA (K-DUR,KLOR-CON) 20 MEQ tablet Take 20 mEq by mouth daily with breakfast.     [provider]  risperiDONE (RISPERDAL) 3 MG tablet Take 3 mg by mouth at bedtime.    [provider]  vitamin B-12 (CYANOCOBALAMIN) 500 MCG tablet Take 500 mcg by mouth daily.    [provider]      Allergies    Icodextrin    Review of Systems   Review of Systems  Unable to perform ROS: Other  Gastrointestinal:  Positive for vomiting.   Physical Exam Updated Vital Signs BP 113/86    Pulse (!) 103    Temp 98.9 F (37.2 C) (Oral)    Resp 18    SpO2 99%  Physical Exam Vitals and nursing note reviewed.  56 year old female, resting comfortably and in no acute distress. Vital signs are significant for borderline elevated heart rate. Oxygen saturation is 99%, which is normal. Head is normocephalic and atraumatic.  PERRLA, EOMI. Oropharynx is clear. Neck is nontender and supple without adenopathy or JVD. Back is nontender and there is no CVA tenderness. Lungs are clear without rales, wheezes, or rhonchi. Chest is nontender. Heart has regular rate and rhythm without murmur. Abdomen is soft, flat, nontender without masses or hepatosplenomegaly and peristalsis is hypoactive.  Peritoneal dialysis catheter is in place. Extremities have no cyanosis or edema, full range of motion is present. Skin is warm and dry without rash. Neurologic: Awake and alert, cranial nerves are intact, moves all extremities equally.  ED Results / Procedures / Treatments   Labs (all labs ordered are listed, but only abnormal results are displayed) Labs Reviewed  COMPREHENSIVE METABOLIC PANEL - Abnormal; Notable for the following components:      Result Value   Sodium 128 (*)    Potassium 2.6 (*)    Chloride 90 (*)    Creatinine, Ser 4.86 (*)    Calcium 7.2 (*)    Albumin 2.0 (*)    AST 13 (*)    Alkaline Phosphatase 245 (*)    GFR, Estimated 10 (*)    All other components within normal limits  CBC - Abnormal; Notable for the following components:   RBC 3.84 (*)    Hemoglobin 11.7 (*)    RDW 16.1 (*)    Platelets 450 (*)    All other components within normal limits  LIPASE, BLOOD  URINALYSIS, ROUTINE W REFLEX MICROSCOPIC  HCG, SERUM, QUALITATIVE  MAGNESIUM   Procedures Procedures    Medications Ordered in ED Medications  potassium chloride 10 mEq in 100 mL IVPB (has no administration in time range)  ondansetron (ZOFRAN) injection 4 mg (has no administration in time range)  potassium chloride SA (KLOR-CON M) CR tablet 40 mEq (has no administration in time range)  loperamide (IMODIUM) capsule 4 mg (has no administration in time range)    ED Course/ Medical Decision Making/ A&P                           Medical Decision Making Amount and/or Complexity of Data Reviewed Labs: ordered.  Risk OTC  drugs. Prescription drug management.   Nausea, vomiting, diarrhea and pattern most consistent with viral gastroenteritis.  No red flags to suggest more serious pathology.  No evidence of bowel obstruction, diverticulitis.  Labs showed anemia which is improved compared with baseline, thrombocytosis which is probably an acute phase reactant.  Chemistry panel shows elevated creatinine consistent with known history of renal failure.  Also, mild to moderate hyponatremia which is improved compared with 09/11/2021, not felt to be clinically significant.  She is significantly hypokalemic with potassium 2.6.  We will need to check magnesium level.  Alkaline phosphatase is elevated slightly higher than baseline, probably representing renal osteodystrophy.  Because of her degree of hypokalemia, she  will need both oral and intravenous potassium, will also give IV ondansetron and oral loperamide.  Old records are reviewed, and she has no relevant past visits.  Magnesium is come back low at 1.5.  She is given a dose of intravenous magnesium.  While she was getting these medications, she did have diarrhea which was noted to have an odor suspicious for C. difficile.  Specimen was sent for C. difficile PCR and is positive.  She is discharged with prescriptions for ondansetron, K-Dur, Slow-Mag, vancomycin solution.  Follow-up with PCP, return precautions discussed.  CRITICAL CARE Performed by: Delora Fuel Total critical care time: 45 minutes Critical care time was exclusive of separately billable procedures and treating other patients. Critical care was necessary to treat or prevent imminent or life-threatening deterioration. Critical care was time spent personally by me on the following activities: development of treatment plan with patient and/or surrogate as well as nursing, discussions with consultants, evaluation of patient's response to treatment, examination of patient, obtaining history from patient or surrogate,  ordering and performing treatments and interventions, ordering and review of laboratory studies, ordering and review of radiographic studies, pulse oximetry and re-evaluation of patient's condition.  Final Clinical Impression(s) / ED Diagnoses Final diagnoses:  Nausea vomiting and diarrhea  Hypokalemia  Hypomagnesemia  Hyponatremia  Elevated alkaline phosphatase level  Anemia associated with chronic renal failure  Thrombocytosis  Clostridium difficile diarrhea  End-stage renal disease on peritoneal dialysis [facility])    Rx / DC Orders ED Discharge Orders          Ordered    vancomycin (VANCOCIN) 50 mg/mL SOLN oral solution  4 times daily        12/08/21 0646    magnesium chloride (SLOW-MAG) 64 MG TBEC SR tablet  Daily        12/08/21 0646    potassium chloride SA (KLOR-CON M) 20 MEQ tablet  Daily with breakfast        12/08/21 0646    ondansetron (ZOFRAN) 4 MG tablet  Every 6 hours PRN        12/08/21 6948              Delora Fuel, MD 54/62/70 3500    Delora Fuel, MD 93/81/82 [phone]

## 2021-12-08 NOTE — ED Notes (Signed)
Patient's legal guardian called back, Jane [name] will be coming to get patient.

## 2021-12-08 NOTE — ED Notes (Addendum)
[phone] Moll (family who is taking patient home.)

## 2021-12-09 DIAGNOSIS — D631 Anemia in chronic kidney disease: Secondary | ICD-10-CM | POA: Diagnosis not present

## 2021-12-09 DIAGNOSIS — N186 End stage renal disease: Secondary | ICD-10-CM | POA: Diagnosis not present

## 2021-12-09 DIAGNOSIS — N2581 Secondary hyperparathyroidism of renal origin: Secondary | ICD-10-CM | POA: Diagnosis not present

## 2021-12-09 DIAGNOSIS — I959 Hypotension, unspecified: Secondary | ICD-10-CM | POA: Diagnosis not present

## 2021-12-09 DIAGNOSIS — I5020 Unspecified systolic (congestive) heart failure: Secondary | ICD-10-CM | POA: Diagnosis not present

## 2021-12-09 DIAGNOSIS — R627 Adult failure to thrive: Secondary | ICD-10-CM | POA: Diagnosis not present

## 2021-12-09 DIAGNOSIS — L89613 Pressure ulcer of right heel, stage 3: Secondary | ICD-10-CM | POA: Diagnosis not present

## 2021-12-10 DIAGNOSIS — D631 Anemia in chronic kidney disease: Secondary | ICD-10-CM | POA: Diagnosis not present

## 2021-12-10 DIAGNOSIS — N186 End stage renal disease: Secondary | ICD-10-CM | POA: Diagnosis not present

## 2021-12-10 DIAGNOSIS — N2581 Secondary hyperparathyroidism of renal origin: Secondary | ICD-10-CM | POA: Diagnosis not present

## 2021-12-11 DIAGNOSIS — N186 End stage renal disease: Secondary | ICD-10-CM | POA: Diagnosis not present

## 2021-12-11 DIAGNOSIS — N2581 Secondary hyperparathyroidism of renal origin: Secondary | ICD-10-CM | POA: Diagnosis not present

## 2021-12-11 DIAGNOSIS — D631 Anemia in chronic kidney disease: Secondary | ICD-10-CM | POA: Diagnosis not present

## 2021-12-12 ENCOUNTER — Other Ambulatory Visit (HOSPITAL_COMMUNITY): Payer: Self-pay | Admitting: Internal Medicine

## 2021-12-12 ENCOUNTER — Ambulatory Visit (HOSPITAL_COMMUNITY)
Admission: RE | Admit: 2021-12-12 | Discharge: 2021-12-12 | Disposition: A | Discharge status: Home / Self Care | Payer: Medicare Other | Source: Ambulatory Visit | Attending: Internal Medicine | Admitting: Internal Medicine

## 2021-12-12 ENCOUNTER — Other Ambulatory Visit: Payer: Self-pay

## 2021-12-12 DIAGNOSIS — L89610 Pressure ulcer of right heel, unstageable: Secondary | ICD-10-CM | POA: Diagnosis not present

## 2021-12-12 DIAGNOSIS — I959 Hypotension, unspecified: Secondary | ICD-10-CM | POA: Diagnosis not present

## 2021-12-12 DIAGNOSIS — D631 Anemia in chronic kidney disease: Secondary | ICD-10-CM | POA: Diagnosis not present

## 2021-12-12 DIAGNOSIS — L97519 Non-pressure chronic ulcer of other part of right foot with unspecified severity: Secondary | ICD-10-CM | POA: Diagnosis not present

## 2021-12-12 DIAGNOSIS — N186 End stage renal disease: Secondary | ICD-10-CM | POA: Diagnosis not present

## 2021-12-12 DIAGNOSIS — N2581 Secondary hyperparathyroidism of renal origin: Secondary | ICD-10-CM | POA: Diagnosis not present

## 2021-12-12 DIAGNOSIS — L89613 Pressure ulcer of right heel, stage 3: Secondary | ICD-10-CM | POA: Diagnosis not present

## 2021-12-12 DIAGNOSIS — I5020 Unspecified systolic (congestive) heart failure: Secondary | ICD-10-CM | POA: Diagnosis not present

## 2021-12-12 DIAGNOSIS — S91301A Unspecified open wound, right foot, initial encounter: Secondary | ICD-10-CM | POA: Diagnosis not present

## 2021-12-12 DIAGNOSIS — R627 Adult failure to thrive: Secondary | ICD-10-CM | POA: Diagnosis not present

## 2021-12-12 IMAGING — DX DG ABDOMEN 1V
1 series · 2 of 2 positions shown · non-contrast
Comparison: KUB dated 09/04/2021

CLINICAL DATA: Ileus

EXAM:
ABDOMEN - 1 VIEW

[Series 1: abdomen · 0.14mm/px · 2 of 2 slices shown]
[im 1/2]
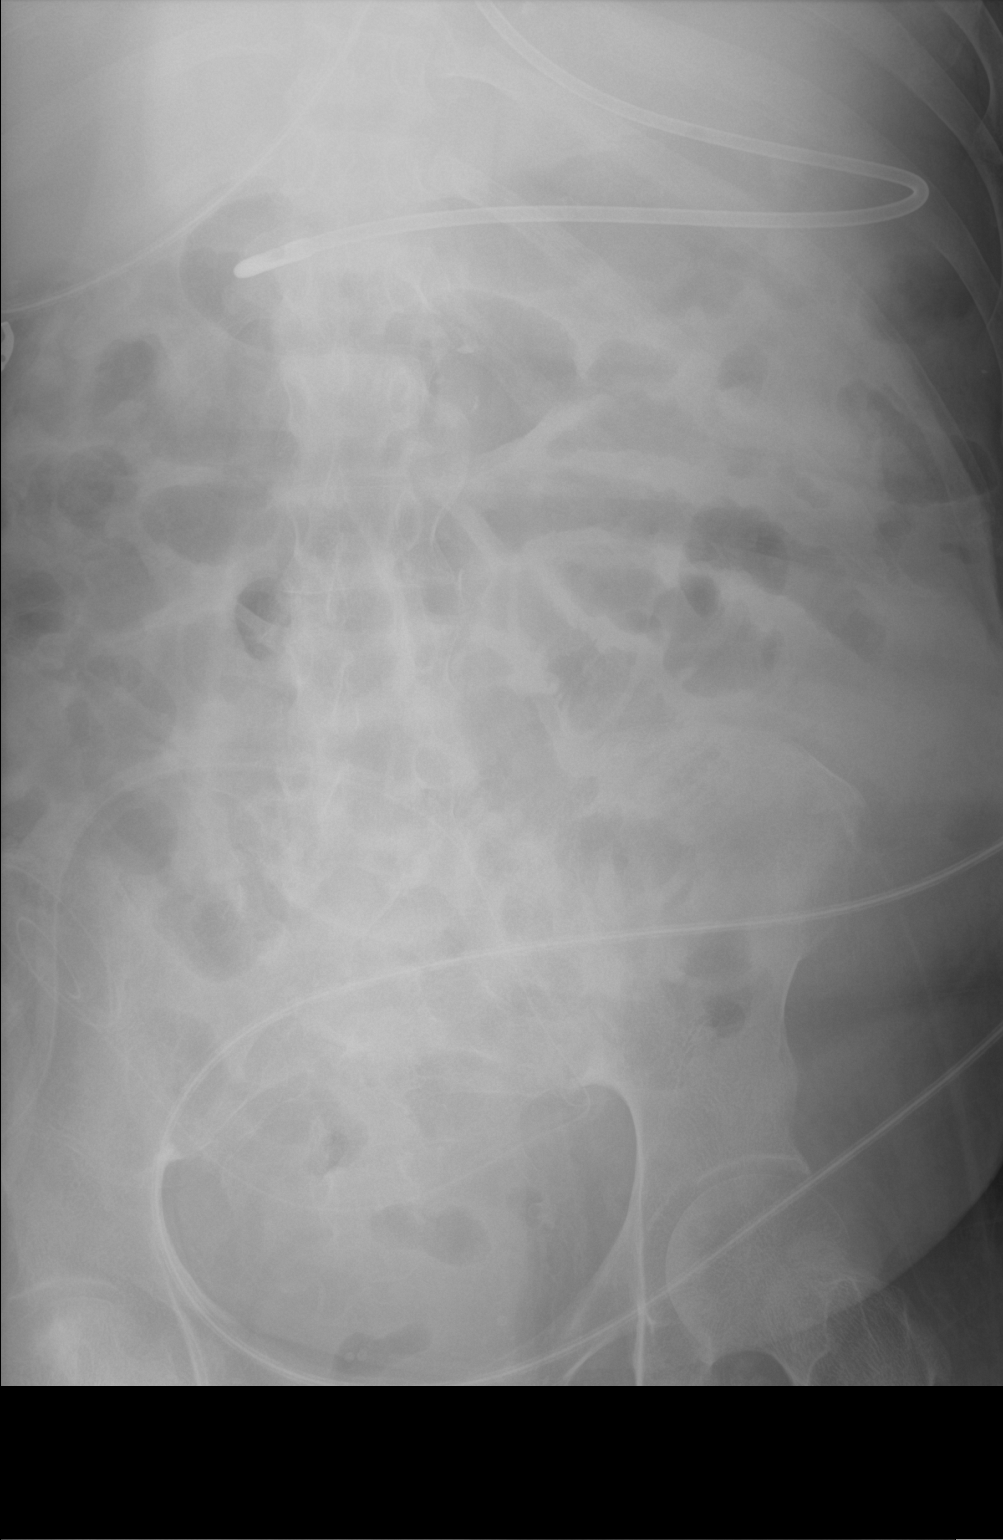
[im 2/2]
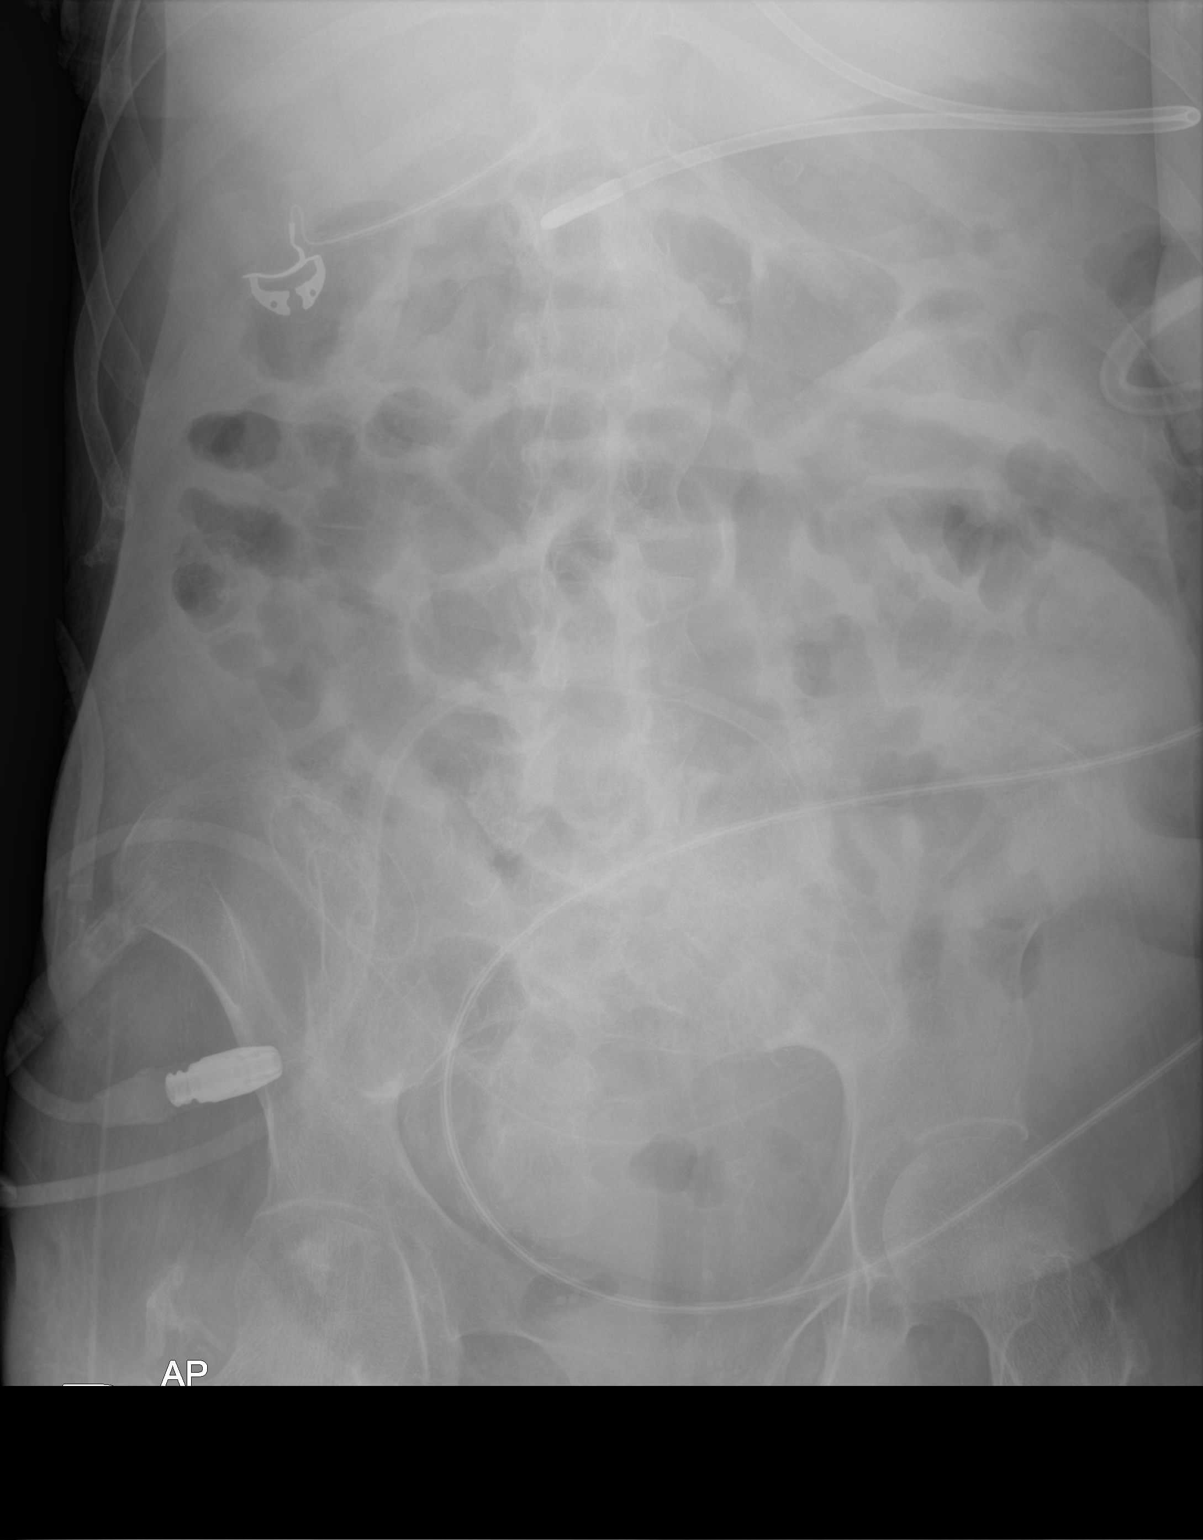

[2 of 2 positions shown; findings below may reference images not displayed]

FINDINGS: The enteric catheter tip projects over the expected location of the
pylorus. Diffuse gaseous distension of the small bowel is again
seen, similar to slightly improved compared to the prior study.

A peritoneal dialysis catheter is again seen.  The bones are stable.
IMPRESSION: Gaseous distention of the small bowel is stable to slightly
decreased compared to the study from 09/04/2021.

## 2021-12-13 ENCOUNTER — Encounter (HOSPITAL_BASED_OUTPATIENT_CLINIC_OR_DEPARTMENT_OTHER): Payer: Medicare Other | Admitting: Internal Medicine

## 2021-12-13 ENCOUNTER — Other Ambulatory Visit (HOSPITAL_COMMUNITY): Payer: Self-pay | Admitting: Internal Medicine

## 2021-12-13 ENCOUNTER — Other Ambulatory Visit: Payer: Self-pay | Admitting: Internal Medicine

## 2021-12-13 ENCOUNTER — Telehealth (HOSPITAL_COMMUNITY): Payer: Self-pay | Admitting: Emergency Medicine

## 2021-12-13 DIAGNOSIS — I959 Hypotension, unspecified: Secondary | ICD-10-CM | POA: Diagnosis not present

## 2021-12-13 DIAGNOSIS — L89610 Pressure ulcer of right heel, unstageable: Secondary | ICD-10-CM

## 2021-12-13 DIAGNOSIS — Z992 Dependence on renal dialysis: Secondary | ICD-10-CM | POA: Diagnosis not present

## 2021-12-13 DIAGNOSIS — F259 Schizoaffective disorder, unspecified: Secondary | ICD-10-CM

## 2021-12-13 DIAGNOSIS — D631 Anemia in chronic kidney disease: Secondary | ICD-10-CM | POA: Diagnosis not present

## 2021-12-13 DIAGNOSIS — G40909 Epilepsy, unspecified, not intractable, without status epilepticus: Secondary | ICD-10-CM | POA: Diagnosis not present

## 2021-12-13 DIAGNOSIS — R188 Other ascites: Secondary | ICD-10-CM | POA: Diagnosis not present

## 2021-12-13 DIAGNOSIS — N2581 Secondary hyperparathyroidism of renal origin: Secondary | ICD-10-CM | POA: Diagnosis not present

## 2021-12-13 DIAGNOSIS — L89613 Pressure ulcer of right heel, stage 3: Secondary | ICD-10-CM | POA: Diagnosis not present

## 2021-12-13 DIAGNOSIS — Z993 Dependence on wheelchair: Secondary | ICD-10-CM | POA: Diagnosis not present

## 2021-12-13 DIAGNOSIS — R627 Adult failure to thrive: Secondary | ICD-10-CM | POA: Diagnosis not present

## 2021-12-13 DIAGNOSIS — N186 End stage renal disease: Secondary | ICD-10-CM

## 2021-12-13 DIAGNOSIS — K8010 Calculus of gallbladder with chronic cholecystitis without obstruction: Secondary | ICD-10-CM | POA: Diagnosis not present

## 2021-12-13 DIAGNOSIS — Z8701 Personal history of pneumonia (recurrent): Secondary | ICD-10-CM | POA: Diagnosis not present

## 2021-12-13 DIAGNOSIS — Z9181 History of falling: Secondary | ICD-10-CM | POA: Diagnosis not present

## 2021-12-13 DIAGNOSIS — I5020 Unspecified systolic (congestive) heart failure: Secondary | ICD-10-CM | POA: Diagnosis not present

## 2021-12-13 DIAGNOSIS — I700 Atherosclerosis of aorta: Secondary | ICD-10-CM | POA: Diagnosis not present

## 2021-12-13 DIAGNOSIS — E440 Moderate protein-calorie malnutrition: Secondary | ICD-10-CM | POA: Diagnosis not present

## 2021-12-13 IMAGING — DX DG ABDOMEN 1V
1 series · 1 of 1 positions shown · non-contrast
Comparison: 09/07/2021 and earlier.

CLINICAL DATA: 55-year-old female on peritoneal dialysis. Query
ileus.

EXAM:
ABDOMEN - 1 VIEW

[abdomen kub]
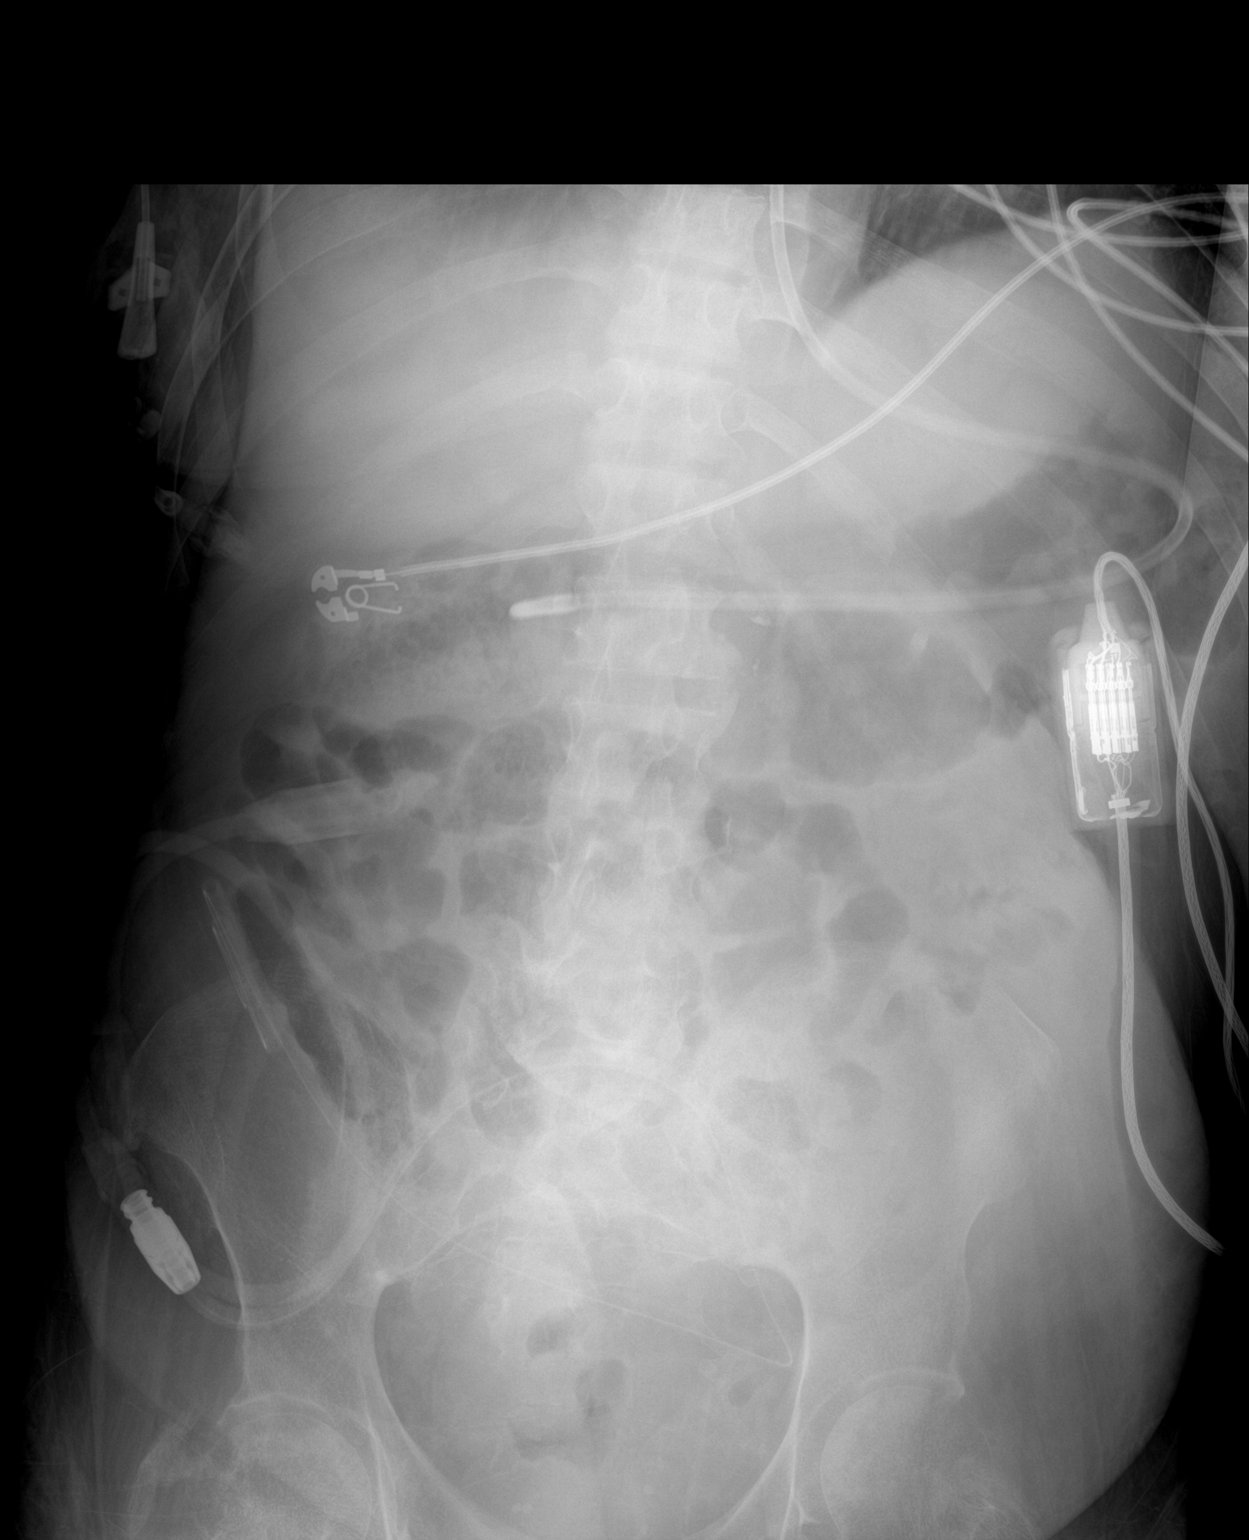

[1 of 1 positions shown; findings below may reference images not displayed]

FINDINGS: Portable AP supine view at [id number] hours. Stable enteric feeding tube,
tip at the gastric antrum. Non obstructed bowel gas pattern,
improved since 09/04/2021. Peritoneal dialysis catheter again
projects over the lower abdomen. Grossly negative lung bases. No
acute osseous abnormality identified.
IMPRESSION: 1. Non obstructed bowel gas pattern, improved since 09/04/2021.
Doubt ileus.
2. Stable enteric feeding tube, peritoneal dialysis catheter.

## 2021-12-13 IMAGING — DX DG CHEST 1V PORT
1 series · 1 of 1 positions shown · non-contrast
Comparison: Chest x-ray 08/27/2021.

CLINICAL DATA: Fever.

EXAM:
PORTABLE CHEST 1 VIEW

[chest]
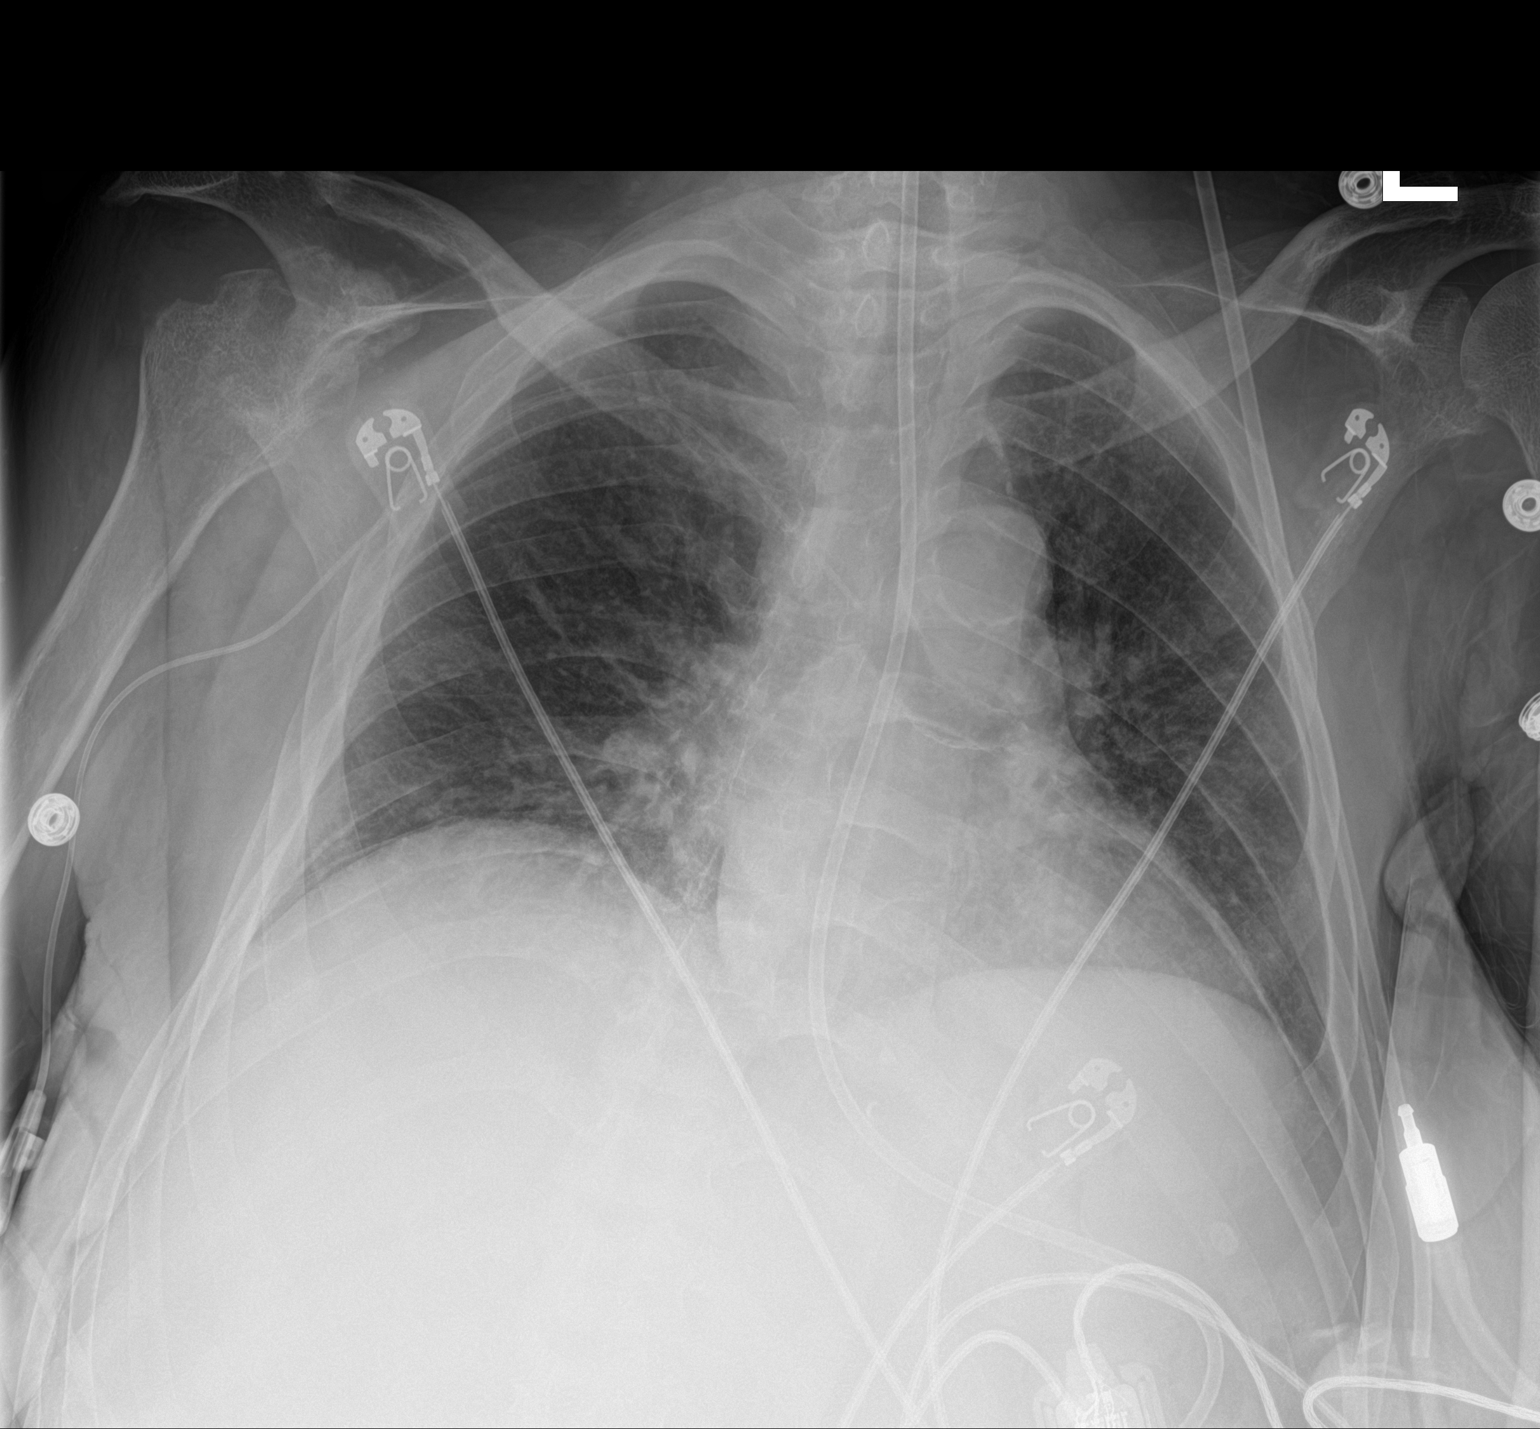

[1 of 1 positions shown; findings below may reference images not displayed]

FINDINGS: Enteric tube extends below the diaphragm. Right-sided central venous
catheter tip projects over the brachiocephalic SVC junction. There
is no lung consolidation, pleural effusion or pneumothorax.
Cardiomediastinal silhouette is within normal limits. No acute
fractures are seen. Chronic changes of the right shoulder are
similar to the prior exam.
IMPRESSION: 1. No active disease.
2. Right sided central venous catheter tip projects over the
brachiocephalic SVC junction.
3. Enteric tube extends below the diaphragm.

## 2021-12-13 NOTE — Progress Notes (Addendum)
Jane [name], Jane [name] ([id number]) Visit Report for 12/13/2021 Chief Complaint Document Details Patient Name: Date of Service: Jane [name], Jane [name]. 12/13/2021 9:00 A M Medical Record Number: [id number] Patient Account Number: [id number] Date of Birth/Sex: Treating RN: 05/27/1966 (56 y.o. F) Primary Care Provider: [name] Other Clinician: Referring Provider: Treating Provider/Extender: Carlus Pavlov in Treatment: 12 Information Obtained from: Patient Chief Complaint Right heel wound Electronic Signature(s) Signed: 12/13/2021 11:12:54 AM By: [name] DO Entered By: [name] on 12/13/2021 10:52:37 -------------------------------------------------------------------------------- Debridement Details Patient Name: Date of Service: Jane Nurse M. 12/13/2021 9:00 A M Medical Record Number: [id number] Patient Account Number: [id number] Date of Birth/Sex: Treating RN: [birth year] (56 y.o. [name], [name] Primary Care Provider: [name] Other Clinician: Referring Provider: Treating Provider/Extender: Carlus Pavlov in Treatment: 12 Debridement Performed for Assessment: Wound #2 Right Calcaneus Performed By: Clinician Deon Pilling, RN Debridement Type: Chemical/Enzymatic/Mechanical Agent Used: Santyl Level of Consciousness (Pre-procedure): Awake and Alert Pre-procedure Verification/Time Out No Taken: Bleeding: None Response to Treatment: Procedure was tolerated well Level of Consciousness (Post- Awake and Alert procedure): Post Debridement Measurements of Total Wound Length: (cm) 1.1 Stage: Unstageable/Unclassified Width: (cm) 1.9 Depth: (cm) 0.5 Volume: (cm) 0.821 Character of Wound/Ulcer Post Debridement: Requires Further Debridement Post Procedure Diagnosis Same as Pre-procedure Electronic Signature(s) Signed: 12/13/2021 11:12:54 AM By: [name] DO Signed: 12/13/2021 4:50:54 PM  By: Deon Pilling RN, BSN Entered By: Deon Pilling on 12/13/2021 09:44:42 -------------------------------------------------------------------------------- HPI Details Patient Name: Date of Service: Jane [name], Jane M. 12/13/2021 9:00 A M Medical Record Number: [id number] Patient Account Number: [id number] Date of Birth/Sex: Treating RN: 1965/11/04 (56 y.o. F) Primary Care Provider: [name] Other Clinician: Referring Provider: Treating Provider/Extender: Carlus Pavlov in Treatment: 12 History of Present Illness HPI Description: Admission 09/19/2021 Jane [name] is a 56 year old female with a past medical history of schizoaffective disorder, end-stage renal disease on peritoneal dialysis and seizure disorder that presents to the clinic for a right heel wound that has been present since her hospitalization in April 2022 because of an epidural abscess that required surgery. During her hospitalization she developed the heel wound and a breast wound. She would lean more aggressively to the left side for long periods of time. The breast wound has healed. The right heel Has developed a loose callus. She is not mobile and is either in her wheelchair or in the bed most of the day. She currently denies pain, increased warmth or erythema to the area or drainage. Son-in-law is present and helps with the history. Patient is unable to participate fully in her care due to her diagnosis of schizoaffective disorder. 10/03/2021; patient presents for follow-up. She has developed a new pressure injury to the calcaneus and has skin breakdown to the surrounding area. She has been using Hydrofera Blue to the previous wound site. She has no issues or complaints today. She denies signs of infection. 12/19; patient presents for follow-up. She has been using Hydrofera Blue to the wound sites. She went back and obtained TBI's. She currently denies signs of infection. 1/3;  patient presents for follow-up. She has been using Hydrofera Blue to the medial wound and Santyl to the calcaneus wound. She has no issues or complaints today. She denies signs of infection. 1/17; patient presents for follow-up. She has been using Hydrofera Blue to the medial wound along with Santyl. She been using Santyl to the calcaneus wound. She has no issues or complaints today.  She denies signs of infection. 1/31; patient presents for follow-up. The medial wound remains closed. She has been using Santyl to the calcaneus wound. She has no issues or complaints today. She denies signs of infection. 2/7; patient presents for follow-up. She has been using Santyl to the calcaneus wound. The medial wound remains closed but does have callus surrounding this. She is wearing her Prevalon boots however takes these off pretty regularly. She currently denies signs of infection. 2/21; patient presents for follow-up. She continues to use Santyl to the calcaneus wound. She is unable to offload the right heel. Son states that the patient is often taking off the Prevalon boots and putting pressure on the heel. She currently denies signs of infection. Electronic Signature(s) Signed: 12/13/2021 11:12:54 AM By: [name] DO Entered By: [name] on 12/13/2021 10:55:09 -------------------------------------------------------------------------------- Physical Exam Details Patient Name: Date of Service: Jane Nurse M. 12/13/2021 9:00 A M Medical Record Number: [id number] Patient Account Number: [id number] Date of Birth/Sex: Treating RN: 01/28/1966 (55 y.o. F) Primary Care Provider: [name] Other Clinician: Referring Provider: Treating Provider/Extender: Carlus Pavlov in Treatment: 12 Constitutional respirations regular, non-labored and within target range for patient.. Cardiovascular 2+ dorsalis pedis/posterior tibialis pulses. Psychiatric pleasant  and cooperative. Notes T the right heel there is an open wound with nonviable tissue throughout. No surrounding signs of infection. o Electronic Signature(s) Signed: 12/13/2021 11:12:54 AM By: [name] DO Entered By: [name] on 12/13/2021 10:55:36 -------------------------------------------------------------------------------- Physician Orders Details Patient Name: Date of Service: Jane Nurse M. 12/13/2021 9:00 A M Medical Record Number: [id number] Patient Account Number: [id number] Date of Birth/Sex: Treating RN: 10-02-1966 (56 y.o. [name], [name] Primary Care Provider: [name] Other Clinician: Referring Provider: Treating Provider/Extender: Carlus Pavlov in Treatment: 12 Verbal / Phone Orders: No Diagnosis Coding ICD-10 Coding Code Description [phone] Pressure ulcer of right heel, stage 3 F25.9 Schizoaffective disorder, unspecified N18.6 End stage renal disease L89.610 Pressure ulcer of right heel, unstageable Follow-up Appointments ppointment in 1 week. - Dr. Heber [place] and [name], RN Room 8 Return A Other: - ***Someone will call you to schedule the CT scan.*** Bathing/ Shower/ Hygiene May shower and wash wound with soap and water. - when changing dressing Edema Control - Lymphedema / SCD / Other Elevate legs to the level of the heart or above for 30 minutes daily and/or when sitting, a frequency of: - 3-4 times a day throughout the day. Off-Loading Other: - Prevalon Boot to right foot, avoid direct pressure to back of heels. Additional Orders / Instructions Follow Nutritious Diet Wound Treatment Wound #2 - Calcaneus Wound Laterality: Right Cleanser: Soap and Water 1 x Per Day/30 Days Discharge Instructions: May shower and wash wound with dial antibacterial soap and water prior to dressing change. Peri-Wound Care: Zinc Oxide Ointment 30g tube 1 x Per Day/30 Days Discharge Instructions: Apply Zinc Oxide around  the wound bed with each dressing change Prim Dressing: Santyl Ointment 1 x Per Day/30 Days ary Discharge Instructions: Apply nickel thick amount to wound bed as instructed Secondary Dressing: Woven Gauze Sponge, Non-Sterile 4x4 in 1 x Per Day/30 Days Discharge Instructions: Cover with gauze. Secondary Dressing: Woven Gauze Sponges 2x2 in 1 x Per Day/30 Days Discharge Instructions: Ensure to moisten gauze with saline and lightly pack into wound bed. Secondary Dressing: ABD Pad, 8x10 1 x Per Day/30 Days Discharge Instructions: Apply abd pad over the gauze. Secured With: [employer], 4.5x3.1 (in/yd) 1 x Per  Day/30 Days Discharge Instructions: Secure with Kerlix as directed. Secured With: Paper Tape, 2x10 (in/yd) 1 x Per Day/30 Days Discharge Instructions: Secure dressing with tape as directed. Radiology Computed Tomography (CT) Scan , right foot WITHOUT contrast - CT of right foot WITHOUT contrast related to nonhealing pressure ulcer looking for infection. CPT code - (ICD10 L89.610 - Pressure ulcer of right heel, unstageable) Patient Medications llergies: No Known Allergies A Notifications Medication Indication Start End 12/16/2021 Augmentin DOSE 1 - oral 875 mg-125 mg tablet - 1 tablet oral BID x 7 days Electronic Signature(s) Signed: 12/16/2021 10:07:33 AM By: [name] DO Previous Signature: 12/16/2021 10:06:01 AM Version By: [name] DO Previous Signature: 12/14/2021 6:06:00 PM Version By: Deon Pilling RN, BSN Previous Signature: 12/15/2021 9:03:37 AM Version By: [name] DO Previous Signature: 12/13/2021 11:12:54 AM Version By: [name] DO Entered By: [name] on 12/16/2021 10:06:16 Prescription 12/13/2021 -------------------------------------------------------------------------------- [name]. [name] DO Patient Name: Provider: 1966/05/30 [id number] Date of Birth: NPI#: [id number] Sex: DEA #: [phone]  [phone] Phone #: License #: [place] Patient Address: [address] [place], [place] [address] [place], [place] [address] [phone] Allergies No Known Allergies Provider's Orders Computed Tomography (CT) Scan , right foot WITHOUT contrast - ICD10: L89.610 - CT of right foot WITHOUT contrast related to nonhealing pressure ulcer looking for infection. CPT code Hand Signature: Date(s): Electronic Signature(s) Signed: 12/16/2021 10:07:33 AM By: [name] DO Previous Signature: 12/14/2021 6:06:00 PM Version By: Deon Pilling RN, BSN Previous Signature: 12/15/2021 9:03:37 AM Version By: [name] DO Previous Signature: 12/13/2021 11:12:54 AM Version By: [name] DO Entered By: [name] on 12/16/2021 10:06:16 -------------------------------------------------------------------------------- Problem List Details Patient Name: Date of Service: Jane Nurse M. 12/13/2021 9:00 A M Medical Record Number: [id number] Patient Account Number: [id number] Date of Birth/Sex: Treating RN: [birth year] (56 y.o. [name], [name] Primary Care Provider: [name] Other Clinician: Referring Provider: Treating Provider/Extender: Carlus Pavlov in Treatment: 12 Active Problems ICD-10 Encounter Code Description Active Date MDM Diagnosis L89.613 Pressure ulcer of right heel, stage 3 09/19/2021 No Yes F25.9 Schizoaffective disorder, unspecified 09/19/2021 No Yes N18.6 End stage renal disease 09/19/2021 No Yes L89.610 Pressure ulcer of right heel, unstageable 10/03/2021 No Yes Inactive Problems Resolved Problems Electronic Signature(s) Signed: 12/13/2021 11:12:54 AM By: [name] DO Entered By: [name] on 12/13/2021 10:52:17 -------------------------------------------------------------------------------- Progress Note Details Patient  Name: Date of Service: Jane Nurse M. 12/13/2021 9:00 A M Medical Record Number: [id number] Patient Account Number: [id number] Date of Birth/Sex: Treating RN: 1966/09/12 (56 y.o. F) Primary Care Provider: [name] Other Clinician: Referring Provider: Treating Provider/Extender: Carlus Pavlov in Treatment: 12 Subjective Chief Complaint Information obtained from Patient Right heel wound History of Present Illness (HPI) Admission 09/19/2021 Ms. Jane [name] is a 56 year old female with a past medical history of schizoaffective disorder, end-stage renal disease on peritoneal dialysis and seizure disorder that presents to the clinic for a right heel wound that has been present since her hospitalization in April 2022 because of an epidural abscess that required surgery. During her hospitalization she developed the heel wound and a breast wound. She would lean more aggressively to the left side for long periods of time. The breast wound has healed. The right heel Has developed a loose callus. She is not mobile and is either in her wheelchair or in the bed most of  the day. She currently denies pain, increased warmth or erythema to the area or drainage. Son-in-law is present and helps with the history. Patient is unable to participate fully in her care due to her diagnosis of schizoaffective disorder. 10/03/2021; patient presents for follow-up. She has developed a new pressure injury to the calcaneus and has skin breakdown to the surrounding area. She has been using Hydrofera Blue to the previous wound site. She has no issues or complaints today. She denies signs of infection. 12/19; patient presents for follow-up. She has been using Hydrofera Blue to the wound sites. She went back and obtained TBI's. She currently denies signs of infection. 1/3; patient presents for follow-up. She has been using Hydrofera Blue to the medial wound and Santyl to the  calcaneus wound. She has no issues or complaints today. She denies signs of infection. 1/17; patient presents for follow-up. She has been using Hydrofera Blue to the medial wound along with Santyl. She been using Santyl to the calcaneus wound. She has no issues or complaints today. She denies signs of infection. 1/31; patient presents for follow-up. The medial wound remains closed. She has been using Santyl to the calcaneus wound. She has no issues or complaints today. She denies signs of infection. 2/7; patient presents for follow-up. She has been using Santyl to the calcaneus wound. The medial wound remains closed but does have callus surrounding this. She is wearing her Prevalon boots however takes these off pretty regularly. She currently denies signs of infection. 2/21; patient presents for follow-up. She continues to use Santyl to the calcaneus wound. She is unable to offload the right heel. Son states that the patient is often taking off the Prevalon boots and putting pressure on the heel. She currently denies signs of infection. Patient History Information obtained from Patient, Caregiver. Family History Cancer - Mother, Kidney Disease - Maternal Grandparents, No family history of Diabetes, Heart Disease, Hereditary Spherocytosis, Hypertension, Lung Disease, Seizures, Stroke, Thyroid Problems, Tuberculosis. Social History Never smoker, Marital Status - Single, Alcohol Use - Never, Drug Use - No History, Caffeine Use - Rarely. Medical History Eyes Patient has history of Cataracts Hematologic/Lymphatic Patient has history of Anemia Cardiovascular Patient has history of Congestive Heart Failure, Hypotension Denies history of Hypertension Genitourinary Patient has history of End Stage Renal Disease - Dialysis Neurologic Patient has history of Seizure Disorder - last seizure several years ago Medical A Surgical History Notes nd Endocrine Hypothyroidism Musculoskeletal Laminectomy  05/2021 Psychiatric Schizoaffective Disorder Objective Constitutional respirations regular, non-labored and within target range for patient.. Vitals Time Taken: 9:08 AM, Height: 65 in, Source: Stated, Weight: 130 lbs, Source: Stated, BMI: 21.6, Temperature: 98.2 F, Pulse: 111 bpm, Respiratory Rate: 22 breaths/min, Blood Pressure: 108/60 mmHg. Cardiovascular 2+ dorsalis pedis/posterior tibialis pulses. Psychiatric pleasant and cooperative. General Notes: T the right heel there is an open wound with nonviable tissue throughout. No surrounding signs of infection. o Integumentary (Hair, Skin) Wound #2 status is Open. Original cause of wound was Pressure Injury. The date acquired was: 10/03/2021. The wound has been in treatment 10 weeks. The wound is located on the Right Calcaneus. The wound measures 1.1cm length x 1.9cm width x 0.5cm depth; 1.641cm^2 area and 0.821cm^3 volume. There is Fat Layer (Subcutaneous Tissue) exposed. There is no tunneling or undermining noted. There is a medium amount of serosanguineous drainage noted. The wound margin is distinct with the outline attached to the wound base. There is small (1-33%) red granulation within the wound bed. There is a large (  67-100%) amount of necrotic tissue within the wound bed including Adherent Slough. Assessment Active Problems ICD-10 Pressure ulcer of right heel, stage 3 Schizoaffective disorder, unspecified End stage renal disease Pressure ulcer of right heel, unstageable Patient's wound is slightly deeper. It probes very close to bone. She had an x-ray of the right foot yesterday that showed no osseous abnormality. Due to the nonhealing nature of the wound I recommended a higher level imaging. Ideally I would get an MRI but due to patient's dementia she would not tolerate this. I recommended a CT scan. I recommended continuing with Santyl daily And will give her an antibiotic since the wound has declined. Patient  cannot aggressively offload the area due to dementia. She is at high risk for amputation and this was discussed with patient and son. Procedures Wound #2 Pre-procedure diagnosis of Wound #2 is a Pressure Ulcer located on the Right Calcaneus . There was a Chemical/Enzymatic/Mechanical debridement performed by Deon Pilling, RN.Marland Kitchen Agent used was [employer]. There was no bleeding. The procedure was tolerated well. Post Debridement Measurements: 1.1cm length x 1.9cm width x 0.5cm depth; 0.821cm^3 volume. Post debridement Stage noted as Unstageable/Unclassified. Character of Wound/Ulcer Post Debridement requires further debridement. Post procedure Diagnosis Wound #2: Same as Pre-Procedure Plan Follow-up Appointments: Return Appointment in 1 week. - Dr. Heber [place] and [name], RN Room 8 Other: - ***Someone will call you to schedule the CT scan.*** Bathing/ Shower/ Hygiene: May shower and wash wound with soap and water. - when changing dressing Edema Control - Lymphedema / SCD / Other: Elevate legs to the level of the heart or above for 30 minutes daily and/or when sitting, a frequency of: - 3-4 times a day throughout the day. Off-Loading: Other: - Prevalon Boot to right foot, avoid direct pressure to back of heels. Additional Orders / Instructions: Follow Nutritious Diet Radiology ordered were: Computed T omography (CT) Scan , right foot WITHOUT contrast - CT of right foot WITHOUT contrast related to nonhealing pressure ulcer looking for infection. CPT code The following medication(s) was prescribed: Augmentin oral 875 mg-125 mg tablet 1 1 tablet oral BID x 7 days starting 12/16/2021 WOUND #2: - Calcaneus Wound Laterality: Right Cleanser: Soap and Water 1 x Per Day/30 Days Discharge Instructions: May shower and wash wound with dial antibacterial soap and water prior to dressing change. Peri-Wound Care: Zinc Oxide Ointment 30g tube 1 x Per Day/30 Days Discharge Instructions: Apply Zinc Oxide around the  wound bed with each dressing change Prim Dressing: Santyl Ointment 1 x Per Day/30 Days ary Discharge Instructions: Apply nickel thick amount to wound bed as instructed Secondary Dressing: Woven Gauze Sponge, Non-Sterile 4x4 in 1 x Per Day/30 Days Discharge Instructions: Cover with gauze. Secondary Dressing: Woven Gauze Sponges 2x2 in 1 x Per Day/30 Days Discharge Instructions: Ensure to moisten gauze with saline and lightly pack into wound bed. Secondary Dressing: ABD Pad, 8x10 1 x Per Day/30 Days Discharge Instructions: Apply abd pad over the gauze. Secured With: [employer], 4.5x3.1 (in/yd) 1 x Per Day/30 Days Discharge Instructions: Secure with Kerlix as directed. Secured With: Paper T ape, 2x10 (in/yd) 1 x Per Day/30 Days Discharge Instructions: Secure dressing with tape as directed. 1. Santyl 2. CT scan 3. Aggressive offloading 4. Follow-up in 1 week 5. Augmentin Electronic Signature(s) Signed: 12/16/2021 10:07:33 AM By: [name] DO Previous Signature: 12/13/2021 11:12:54 AM Version By: [name] DO Entered By: [name] on 12/16/2021 10:07:14 -------------------------------------------------------------------------------- HxROS Details Patient Name: Date of Service: Jane [name],  Jane M. 12/13/2021 9:00 A M Medical Record Number: [id number] Patient Account Number: [id number] Date of Birth/Sex: Treating RN: 02/09/1966 (56 y.o. F) Primary Care Provider: [name] Other Clinician: Referring Provider: Treating Provider/Extender: Carlus Pavlov in Treatment: 12 Information Obtained From Patient Caregiver Eyes Medical History: Positive for: Cataracts Hematologic/Lymphatic Medical History: Positive for: Anemia Cardiovascular Medical History: Positive for: Congestive Heart Failure; Hypotension Negative for: Hypertension Endocrine Medical History: Past Medical History  Notes: Hypothyroidism Genitourinary Medical History: Positive for: End Stage Renal Disease - Dialysis Musculoskeletal Medical History: Past Medical History Notes: Laminectomy 05/2021 Neurologic Medical History: Positive for: Seizure Disorder - last seizure several years ago Psychiatric Medical History: Past Medical History Notes: Schizoaffective Disorder HBO Extended History Items Eyes: Cataracts Immunizations Pneumococcal Vaccine: Received Pneumococcal Vaccination: No Implantable Devices Yes Family and Social History Cancer: Yes - Mother; Diabetes: No; Heart Disease: No; Hereditary Spherocytosis: No; Hypertension: No; Kidney Disease: Yes - Maternal Grandparents; Lung Disease: No; Seizures: No; Stroke: No; Thyroid Problems: No; Tuberculosis: No; Never smoker; Marital Status - Single; Alcohol Use: Never; Drug Use: No History; Caffeine Use: Rarely; Financial Concerns: No; Food, Clothing or Shelter Needs: No; Support System Lacking: No; Transportation Concerns: No Electronic Signature(s) Signed: 12/13/2021 11:12:54 AM By: [name] DO Entered By: [name] on 12/13/2021 10:55:16 -------------------------------------------------------------------------------- SuperBill Details Patient Name: Date of Service: Jane Nurse M. 12/13/2021 Medical Record Number: [id number] Patient Account Number: [id number] Date of Birth/Sex: Treating RN: 1966/03/07 (56 y.o. Jane [name] Primary Care Provider: [name] Other Clinician: Referring Provider: Treating Provider/Extender: Carlus Pavlov in Treatment: 12 Diagnosis Coding ICD-10 Codes Code Description [phone] Pressure ulcer of right heel, stage 3 F25.9 Schizoaffective disorder, unspecified N18.6 End stage renal disease L89.610 Pressure ulcer of right heel, unstageable Facility Procedures CPT4 Code: [id number] Description: [phone] - DEBRIDE W/O ANES NON  SELECT Modifier: Quantity: 1 Physician Procedures : CPT4 Code Description Modifier [id number] - WC PHYS LEVEL 4 - EST PT ICD-10 Diagnosis Description L89.610 Pressure ulcer of right heel, unstageable F25.9 Schizoaffective disorder, unspecified N18.6 End stage renal disease Quantity: 1 Electronic Signature(s) Signed: 12/16/2021 10:07:33 AM By: [name] DO Previous Signature: 12/13/2021 11:12:54 AM Version By: [name] DO Entered By: [name] on 12/16/2021 10:07:19

## 2021-12-13 NOTE — Telephone Encounter (Signed)
Asked to address cost of the prescription given on 2/15 for oral vancomycin.   Reviewed chart and initial screen was positive, however PCR was negative.  No active infection. Pt does not need to take the oral vancomycin.  The pharmacist will inform her.

## 2021-12-13 NOTE — Progress Notes (Signed)
OMA, MARZAN ([id number]) Visit Report for 12/13/2021 Arrival Information Details Patient Name: Date of Service: Jane, [name]. 12/13/2021 9:00 A M Medical Record Number: [id number] Patient Account Number: [id number] Date of Birth/Sex: Treating RN: 1966/01/03 (56 y.o. Jane [name] Primary Care Sharmane Dame: [name] Other Clinician: Referring Kersti Scavone: Treating Maleaha Hughett/Extender: Carlus Pavlov in Treatment: 12 Visit Information History Since Last Visit Added or deleted any medications: No Patient Arrived: Wheel Chair Any new allergies or adverse reactions: No Arrival Time: 09:05 Had a fall or experienced change in No Accompanied By: brother in law activities of daily living that may affect Transfer Assistance: Manual risk of falls: Patient Identification Verified: Yes Signs or symptoms of abuse/neglect since last visito No Secondary Verification Process Completed: Yes Hospitalized since last visit: No Patient Requires Transmission-Based Precautions: No Has Dressing in Place as Prescribed: Yes Patient Has Alerts: Yes Pain Present Now: Yes Patient Alerts: R ABI=Non Comp Electronic Signature(s) Signed: 12/13/2021 4:20:47 PM By: Sharyn Creamer RN, BSN Entered By: Sharyn Creamer on 12/13/2021 09:06:55 -------------------------------------------------------------------------------- Encounter Discharge Information Details Patient Name: Date of Service: Jane Nurse M. 12/13/2021 9:00 A M Medical Record Number: [id number] Patient Account Number: [id number] Date of Birth/Sex: Treating RN: 16-Feb-1966 (56 y.o. Jane [name] Primary Care Harshith Pursell: [name] Other Clinician: Referring Pharaoh Pio: Treating Mulan Adan/Extender: Carlus Pavlov in Treatment: 12 Encounter Discharge Information Items Post Procedure Vitals Discharge Condition: Stable Temperature (F): 98.2 Ambulatory Status:  Wheelchair Pulse (bpm): 111 Discharge Destination: Home Respiratory Rate (breaths/min): 20 Transportation: Private Auto Blood Pressure (mmHg): 108/60 Accompanied By: family member Schedule Follow-up Appointment: Yes Clinical Summary of Care: Electronic Signature(s) Signed: 12/13/2021 4:50:54 PM By: Deon Pilling RN, BSN Entered By: Deon Pilling on 12/13/2021 09:45:35 -------------------------------------------------------------------------------- Lower Extremity Assessment Details Patient Name: Date of Service: Jane Nurse M. 12/13/2021 9:00 A M Medical Record Number: [id number] Patient Account Number: [id number] Date of Birth/Sex: Treating RN: 1966/07/28 (56 y.o. Jane [name] Primary Care Numair Masden: [name] Other Clinician: Referring Charlynn Salih: Treating Anay Walter/Extender: Carlus Pavlov in Treatment: 12 Edema Assessment Assessed: [name]: No] Patrice Paradise: No] Edema: [Left: Ye] [Right: s] Calf Left: Right: Point of Measurement: 30 cm From Medial Instep 33.6 cm Ankle Left: Right: Point of Measurement: 7 cm From Medial Instep 20.9 cm Electronic Signature(s) Signed: 12/13/2021 4:20:47 PM By: Sharyn Creamer RN, BSN Entered By: Sharyn Creamer on 12/13/2021 09:15:03 -------------------------------------------------------------------------------- Multi Wound Chart Details Patient Name: Date of Service: Jane Nurse M. 12/13/2021 9:00 A M Medical Record Number: [id number] Patient Account Number: [id number] Date of Birth/Sex: Treating RN: Mar 21, 1966 (56 y.o. F) Primary Care Ladonya Jerkins: [name] Other Clinician: Referring Agatha Duplechain: Treating Nikolette Reindl/Extender: Carlus Pavlov in Treatment: 12 Vital Signs Height(in): 65 Pulse(bpm): 111 Weight(lbs): 130 Blood Pressure(mmHg): 108/60 Body Mass Index(BMI): 21.6 Temperature(F): 98.2 Respiratory Rate(breaths/min): 22 Photos: [N/A:N/A] Right  Calcaneus N/A N/A Wound Location: Pressure Injury N/A N/A Wounding Event: Pressure Ulcer N/A N/A Primary Etiology: Cataracts, Anemia, Congestive Heart N/A N/A Comorbid History: Failure, Hypotension, End Stage Renal Disease, Seizure Disorder 10/03/2021 N/A N/A Date Acquired: 10 N/A N/A Weeks of Treatment: Open N/A N/A Wound Status: No N/A N/A Wound Recurrence: 1.1x1.9x0.5 N/A N/A Measurements L x W x D (cm) 1.641 N/A N/A A (cm) : rea 0.821 N/A N/A Volume (cm) : 68.90% N/A N/A % Reduction in A rea: -55.50% N/A N/A % Reduction in Volume: Unstageable/Unclassified N/A N/A Classification: Medium N/A N/A Exudate A mount: Serosanguineous N/A N/A Exudate  Type: red, brown N/A N/A Exudate Color: Distinct, outline attached N/A N/A Wound Margin: Small (1-33%) N/A N/A Granulation A mount: Red N/A N/A Granulation Quality: Large (67-100%) N/A N/A Necrotic A mount: Fat Layer (Subcutaneous Tissue): Yes N/A N/A Exposed Structures: Fascia: No Tendon: No Muscle: No Joint: No Bone: No Small (1-33%) N/A N/A Epithelialization: Chemical/Enzymatic/Mechanical N/A N/A Debridement: N/A N/A N/A Instrument: None N/A N/A Bleeding: Debridement Treatment Response: Procedure was tolerated well N/A N/A Post Debridement Measurements L x 1.1x1.9x0.5 N/A N/A W x D (cm) 0.821 N/A N/A Post Debridement Volume: (cm) Unstageable/Unclassified N/A N/A Post Debridement Stage: Debridement N/A N/A Procedures Performed: Treatment Notes Wound #2 (Calcaneus) Wound Laterality: Right Cleanser Soap and Water Discharge Instruction: May shower and wash wound with dial antibacterial soap and water prior to dressing change. Peri-Wound Care Zinc Oxide Ointment 30g tube Discharge Instruction: Apply Zinc Oxide around the wound bed with each dressing change Topical Primary Dressing Santyl Ointment Discharge Instruction: Apply nickel thick amount to wound bed as instructed Secondary  Dressing Woven Gauze Sponge, Non-Sterile 4x4 in Discharge Instruction: Cover with gauze. Woven Gauze Sponges 2x2 in Discharge Instruction: Ensure to moisten gauze with saline and lightly pack into wound bed. ABD Pad, 8x10 Discharge Instruction: Apply abd pad over the gauze. Secured With [employer], 4.5x3.1 (in/yd) Discharge Instruction: Secure with Kerlix as directed. Paper Tape, 2x10 (in/yd) Discharge Instruction: Secure dressing with tape as directed. Compression Wrap Compression Stockings Add-Ons Electronic Signature(s) Signed: 12/13/2021 11:12:54 AM By: [name] DO Entered By: [name] on 12/13/2021 10:52:22 -------------------------------------------------------------------------------- Multi-Disciplinary Care Plan Details Patient Name: Date of Service: Jane Nurse M. 12/13/2021 9:00 A M Medical Record Number: [id number] Patient Account Number: [id number] Date of Birth/Sex: Treating RN: 1966-04-02 (56 y.o. Jane [name], Jane [name] Primary Care Lasonya Hubner: [name] Other Clinician: Referring Cleto Claggett: Treating Jerrel Tiberio/Extender: Carlus Pavlov in Treatment: 12 Active Inactive Pressure Nursing Diagnoses: Knowledge deficit related to causes and risk factors for pressure ulcer development Goals: Patient will remain free from development of additional pressure ulcers Date Initiated: 09/19/2021 Target Resolution Date: 01/20/2022 Goal Status: Active Interventions: Assess: immobility, friction, shearing, incontinence upon admission and as needed Assess offloading mechanisms upon admission and as needed Treatment Activities: Pressure reduction/relief device ordered : 09/19/2021 T ordered outside of clinic : 09/19/2021 est Notes: Wound/Skin Impairment Nursing Diagnoses: Impaired tissue integrity Goals: Patient/caregiver will verbalize understanding of skin care regimen Date Initiated: 09/19/2021 Target Resolution  Date: 01/20/2022 Goal Status: Active Ulcer/skin breakdown will have a volume reduction of 30% by week 4 Date Initiated: 09/19/2021 Date Inactivated: 10/25/2021 Target Resolution Date: 10/17/2021 Goal Status: Met Ulcer/skin breakdown will have a volume reduction of 50% by week 8 Date Initiated: 10/25/2021 Date Inactivated: 12/13/2021 Target Resolution Date: 12/16/2021 Unmet Reason: see wound Goal Status: Unmet measurements. Continue offloading and current treatment plan. Interventions: Assess patient/caregiver ability to obtain necessary supplies Assess patient/caregiver ability to perform ulcer/skin care regimen upon admission and as needed Assess ulceration(s) every visit Provide education on ulcer and skin care Treatment Activities: Topical wound management initiated : 09/19/2021 Notes: Electronic Signature(s) Signed: 12/13/2021 4:50:54 PM By: Deon Pilling RN, BSN Entered By: Deon Pilling on 12/13/2021 09:27:43 -------------------------------------------------------------------------------- Pain Assessment Details Patient Name: Date of Service: Jane Nurse M. 12/13/2021 9:00 [place] Record Number: [id number] Patient Account Number: [id number] Date of Birth/Sex: Treating RN: 04-24-1966 (56 y.o. Jane [name] Primary Care Gearline Spilman: [name] Other Clinician: Referring Shaquana Buel: Treating Faria Casella/Extender: Carlus Pavlov in Treatment: 12 Active  Problems Location of Pain Severity and Description of Pain Patient Has Paino Yes Site Locations Rate the pain. Current Pain Level: 6 Worst Pain Level: 6 Least Pain Level: 3 Tolerable Pain Level: 3 Character of Pain Describe the Pain: Aching Pain Management and Medication Current Pain Management: Electronic Signature(s) Signed: 12/13/2021 4:20:47 PM By: Sharyn Creamer RN, BSN Entered By: Sharyn Creamer on 12/13/2021  09:10:51 -------------------------------------------------------------------------------- Patient/Caregiver Education Details Patient Name: Date of Service: Jane [name] 2/21/2023andnbsp9:00 [place] Record Number: [id number] Patient Account Number: [id number] Date of Birth/Gender: Treating RN: 1966/07/14 (56 y.o. Jane [name] Primary Care Physician: [name] Other Clinician: Referring Physician: Treating Physician/Extender: Carlus Pavlov in Treatment: 12 Education Assessment Education Provided To: Patient Education Topics Provided Wound/Skin Impairment: Handouts: Skin Care Do's and Dont's Methods: Explain/Verbal Responses: Reinforcements needed Electronic Signature(s) Signed: 12/13/2021 4:50:54 PM By: Deon Pilling RN, BSN Entered By: Deon Pilling on 12/13/2021 09:28:12 -------------------------------------------------------------------------------- Wound Assessment Details Patient Name: Date of Service: Jane Nurse M. 12/13/2021 9:00 A M Medical Record Number: [id number] Patient Account Number: [id number] Date of Birth/Sex: Treating RN: Mar 20, 1966 (56 y.o. Jane [name] Primary Care Finlee Concepcion: [name] Other Clinician: Referring Ellyson Rarick: Treating Jenasia Dolinar/Extender: Carlus Pavlov in Treatment: 12 Wound Status Wound Number: 2 Primary Pressure Ulcer Etiology: Wound Location: Right Calcaneus Wound Open Wounding Event: Pressure Injury Status: Date Acquired: 10/03/2021 Comorbid Cataracts, Anemia, Congestive Heart Failure, Hypotension, End Weeks Of Treatment: 10 History: Stage Renal Disease, Seizure Disorder Clustered Wound: No Photos Wound Measurements Length: (cm) 1.1 Width: (cm) 1.9 Depth: (cm) 0.5 Area: (cm) 1.641 Volume: (cm) 0.821 % Reduction in Area: 68.9% % Reduction in Volume: -55.5% Epithelialization: Small (1-33%) Tunneling: No Undermining:  No Wound Description Classification: Unstageable/Unclassified Wound Margin: Distinct, outline attached Exudate Amount: Medium Exudate Type: Serosanguineous Exudate Color: red, brown Foul Odor After Cleansing: No Slough/Fibrino Yes Wound Bed Granulation Amount: Small (1-33%) Exposed Structure Granulation Quality: Red Fascia Exposed: No Necrotic Amount: Large (67-100%) Fat Layer (Subcutaneous Tissue) Exposed: Yes Necrotic Quality: Adherent Slough Tendon Exposed: No Muscle Exposed: No Joint Exposed: No Bone Exposed: No Treatment Notes Wound #2 (Calcaneus) Wound Laterality: Right Cleanser Soap and Water Discharge Instruction: May shower and wash wound with dial antibacterial soap and water prior to dressing change. Peri-Wound Care Zinc Oxide Ointment 30g tube Discharge Instruction: Apply Zinc Oxide around the wound bed with each dressing change Topical Primary Dressing Santyl Ointment Discharge Instruction: Apply nickel thick amount to wound bed as instructed Secondary Dressing Woven Gauze Sponge, Non-Sterile 4x4 in Discharge Instruction: Cover with gauze. Woven Gauze Sponges 2x2 in Discharge Instruction: Ensure to moisten gauze with saline and lightly pack into wound bed. ABD Pad, 8x10 Discharge Instruction: Apply abd pad over the gauze. Secured With [employer], 4.5x3.1 (in/yd) Discharge Instruction: Secure with Kerlix as directed. Paper Tape, 2x10 (in/yd) Discharge Instruction: Secure dressing with tape as directed. Compression Wrap Compression Stockings Add-Ons Electronic Signature(s) Signed: 12/13/2021 4:20:47 PM By: Sharyn Creamer RN, BSN Entered By: Sharyn Creamer on 12/13/2021 09:21:10 -------------------------------------------------------------------------------- Vitals Details Patient Name: Date of Service: Jane Nurse M. 12/13/2021 9:00 A M Medical Record Number: [id number] Patient Account Number: [id number] Date of Birth/Sex: Treating  RN: July 07, 1966 (56 y.o. Jane [name] Primary Care Marbin Olshefski: [name] Other Clinician: Referring Adea Geisel: Treating Kroy Sprung/Extender: Carlus Pavlov in Treatment: 12 Vital Signs Time Taken: 09:08 Temperature (F): 98.2 Height (in): 65 Pulse (bpm): 111 Source: Stated Respiratory Rate (breaths/min): 22 Weight (lbs): 130  Blood Pressure (mmHg): 108/60 Source: Stated Reference Range: 80 - 120 mg / dl Body Mass Index (BMI): 21.6 Electronic Signature(s) Signed: 12/13/2021 4:20:47 PM By: Sharyn Creamer RN, BSN Entered By: Sharyn Creamer on 12/13/2021 09:09:57

## 2021-12-14 DIAGNOSIS — N2581 Secondary hyperparathyroidism of renal origin: Secondary | ICD-10-CM | POA: Diagnosis not present

## 2021-12-14 DIAGNOSIS — D631 Anemia in chronic kidney disease: Secondary | ICD-10-CM | POA: Diagnosis not present

## 2021-12-14 DIAGNOSIS — N186 End stage renal disease: Secondary | ICD-10-CM | POA: Diagnosis not present

## 2021-12-15 DIAGNOSIS — N2581 Secondary hyperparathyroidism of renal origin: Secondary | ICD-10-CM | POA: Diagnosis not present

## 2021-12-15 DIAGNOSIS — D631 Anemia in chronic kidney disease: Secondary | ICD-10-CM | POA: Diagnosis not present

## 2021-12-15 DIAGNOSIS — N186 End stage renal disease: Secondary | ICD-10-CM | POA: Diagnosis not present

## 2021-12-16 DIAGNOSIS — N2581 Secondary hyperparathyroidism of renal origin: Secondary | ICD-10-CM | POA: Diagnosis not present

## 2021-12-16 DIAGNOSIS — D631 Anemia in chronic kidney disease: Secondary | ICD-10-CM | POA: Diagnosis not present

## 2021-12-16 DIAGNOSIS — N186 End stage renal disease: Secondary | ICD-10-CM | POA: Diagnosis not present

## 2021-12-17 DIAGNOSIS — N2581 Secondary hyperparathyroidism of renal origin: Secondary | ICD-10-CM | POA: Diagnosis not present

## 2021-12-17 DIAGNOSIS — D631 Anemia in chronic kidney disease: Secondary | ICD-10-CM | POA: Diagnosis not present

## 2021-12-17 DIAGNOSIS — N186 End stage renal disease: Secondary | ICD-10-CM | POA: Diagnosis not present

## 2021-12-18 DIAGNOSIS — N186 End stage renal disease: Secondary | ICD-10-CM | POA: Diagnosis not present

## 2021-12-18 DIAGNOSIS — D631 Anemia in chronic kidney disease: Secondary | ICD-10-CM | POA: Diagnosis not present

## 2021-12-18 DIAGNOSIS — N2581 Secondary hyperparathyroidism of renal origin: Secondary | ICD-10-CM | POA: Diagnosis not present

## 2021-12-19 DIAGNOSIS — L89613 Pressure ulcer of right heel, stage 3: Secondary | ICD-10-CM | POA: Diagnosis not present

## 2021-12-19 DIAGNOSIS — I959 Hypotension, unspecified: Secondary | ICD-10-CM | POA: Diagnosis not present

## 2021-12-19 DIAGNOSIS — N186 End stage renal disease: Secondary | ICD-10-CM | POA: Diagnosis not present

## 2021-12-19 DIAGNOSIS — D631 Anemia in chronic kidney disease: Secondary | ICD-10-CM | POA: Diagnosis not present

## 2021-12-19 DIAGNOSIS — N2581 Secondary hyperparathyroidism of renal origin: Secondary | ICD-10-CM | POA: Diagnosis not present

## 2021-12-19 DIAGNOSIS — R627 Adult failure to thrive: Secondary | ICD-10-CM | POA: Diagnosis not present

## 2021-12-19 DIAGNOSIS — I5020 Unspecified systolic (congestive) heart failure: Secondary | ICD-10-CM | POA: Diagnosis not present

## 2021-12-20 ENCOUNTER — Other Ambulatory Visit: Payer: Self-pay

## 2021-12-20 ENCOUNTER — Encounter (HOSPITAL_BASED_OUTPATIENT_CLINIC_OR_DEPARTMENT_OTHER): Payer: Medicare Other | Admitting: Internal Medicine

## 2021-12-20 DIAGNOSIS — L89610 Pressure ulcer of right heel, unstageable: Secondary | ICD-10-CM

## 2021-12-20 DIAGNOSIS — N186 End stage renal disease: Secondary | ICD-10-CM

## 2021-12-20 DIAGNOSIS — D631 Anemia in chronic kidney disease: Secondary | ICD-10-CM | POA: Diagnosis not present

## 2021-12-20 DIAGNOSIS — F259 Schizoaffective disorder, unspecified: Secondary | ICD-10-CM

## 2021-12-20 DIAGNOSIS — L89613 Pressure ulcer of right heel, stage 3: Secondary | ICD-10-CM | POA: Diagnosis not present

## 2021-12-20 DIAGNOSIS — Z992 Dependence on renal dialysis: Secondary | ICD-10-CM | POA: Diagnosis not present

## 2021-12-20 DIAGNOSIS — N2581 Secondary hyperparathyroidism of renal origin: Secondary | ICD-10-CM | POA: Diagnosis not present

## 2021-12-20 DIAGNOSIS — G40909 Epilepsy, unspecified, not intractable, without status epilepticus: Secondary | ICD-10-CM | POA: Diagnosis not present

## 2021-12-20 NOTE — Progress Notes (Signed)
Jane, [name] ([id number]) Visit Report for 12/20/2021 Chief Complaint Document Details Patient Name: Date of Service: Jane [name], Jane [name]. 12/20/2021 11:15 A M Medical Record Number: [id number] Patient Account Number: [id number] Date of Birth/Sex: Treating RN: 05/03/1966 (56 y.o. F) Primary Care Provider: [name] Other Clinician: Referring Provider: Treating Provider/Extender: Carlus Pavlov in Treatment: 13 Information Obtained from: Patient Chief Complaint Right heel wound Electronic Signature(s) Signed: 12/20/2021 3:28:48 PM By: [name] DO Entered By: [name] on 12/20/2021 15:20:05 -------------------------------------------------------------------------------- Debridement Details Patient Name: Date of Service: Jane Nurse M. 12/20/2021 11:15 A M Medical Record Number: [id number] Patient Account Number: [id number] Date of Birth/Sex: Treating RN: 04-16-1966 (56 y.o. [name], [name] Primary Care Provider: [name] Other Clinician: Referring Provider: Treating Provider/Extender: Carlus Pavlov in Treatment: 13 Debridement Performed for Assessment: Wound #2 Right Calcaneus Performed By: Clinician Deon Pilling, RN Debridement Type: Chemical/Enzymatic/Mechanical Agent Used: Santyl Level of Consciousness (Pre-procedure): Awake and Alert Pre-procedure Verification/Time Out No Taken: Bleeding: None Response to Treatment: Procedure was tolerated well Level of Consciousness (Post- Awake and Alert procedure): Post Debridement Measurements of Total Wound Length: (cm) 1.5 Stage: Unstageable/Unclassified Width: (cm) 0.8 Depth: (cm) 0.6 Volume: (cm) 0.565 Character of Wound/Ulcer Post Debridement: Requires Further Debridement Post Procedure Diagnosis Same as Pre-procedure Electronic Signature(s) Signed: 12/20/2021 3:24:27 PM By: Deon Pilling RN, BSN Signed: 12/20/2021 3:28:48  PM By: [name] DO Entered By: Deon Pilling on 12/20/2021 11:25:00 -------------------------------------------------------------------------------- HPI Details Patient Name: Date of Service: Jane Nurse M. 12/20/2021 11:15 A M Medical Record Number: [id number] Patient Account Number: [id number] Date of Birth/Sex: Treating RN: [birth year] (56 y.o. F) Primary Care Provider: [name] Other Clinician: Referring Provider: Treating Provider/Extender: Carlus Pavlov in Treatment: 13 History of Present Illness HPI Description: Admission 09/19/2021 Jane [name] is a 56 year old female with a past medical history of schizoaffective disorder, end-stage renal disease on peritoneal dialysis and seizure disorder that presents to the clinic for a right heel wound that has been present since her hospitalization in April 2022 because of an epidural abscess that required surgery. During her hospitalization she developed the heel wound and a breast wound. She would lean more aggressively to the left side for long periods of time. The breast wound has healed. The right heel Has developed a loose callus. She is not mobile and is either in her wheelchair or in the bed most of the day. She currently denies pain, increased warmth or erythema to the area or drainage. Son-in-law is present and helps with the history. Patient is unable to participate fully in her care due to her diagnosis of schizoaffective disorder. 10/03/2021; patient presents for follow-up. She has developed a new pressure injury to the calcaneus and has skin breakdown to the surrounding area. She has been using Hydrofera Blue to the previous wound site. She has no issues or complaints today. She denies signs of infection. 12/19; patient presents for follow-up. She has been using Hydrofera Blue to the wound sites. She went back and obtained TBI's. She currently denies signs of infection. 1/3;  patient presents for follow-up. She has been using Hydrofera Blue to the medial wound and Santyl to the calcaneus wound. She has no issues or complaints today. She denies signs of infection. 1/17; patient presents for follow-up. She has been using Hydrofera Blue to the medial wound along with Santyl. She been using Santyl to the calcaneus wound. She has no issues or complaints today.  She denies signs of infection. 1/31; patient presents for follow-up. The medial wound remains closed. She has been using Santyl to the calcaneus wound. She has no issues or complaints today. She denies signs of infection. 2/7; patient presents for follow-up. She has been using Santyl to the calcaneus wound. The medial wound remains closed but does have callus surrounding this. She is wearing her Prevalon boots however takes these off pretty regularly. She currently denies signs of infection. 2/21; patient presents for follow-up. She continues to use Santyl to the calcaneus wound. She is unable to offload the right heel. Son states that the patient is often taking off the Prevalon boots and putting pressure on the heel. She currently denies signs of infection. 2/28; patient presents for follow-up. She continues to use Santyl to the wound bed. She reports taking Augmentin with no issues. She denies systemic signs of infection. It is unclear if she can truly offload this wound. Her CT scan has not been scheduled yet. Electronic Signature(s) Signed: 12/20/2021 3:28:48 PM By: [name] DO Entered By: [name] on 12/20/2021 15:21:02 -------------------------------------------------------------------------------- Physical Exam Details Patient Name: Date of Service: Jane [name], Jane [name]. 12/20/2021 11:15 A M Medical Record Number: [id number] Patient Account Number: [id number] Date of Birth/Sex: Treating RN: Oct 02, 1966 (56 y.o. F) Primary Care Provider: [name] Other Clinician: Referring  Provider: Treating Provider/Extender: Carlus Pavlov in Treatment: 13 Constitutional respirations regular, non-labored and within target range for patient.. Cardiovascular 2+ dorsalis pedis/posterior tibialis pulses. Psychiatric pleasant and cooperative. Notes T the right heel there is an open wound with nonviable tissue throughout, close to bone. Circumferential maceration No surrounding signs of infection. o Electronic Signature(s) Signed: 12/20/2021 3:28:48 PM By: [name] DO Entered By: [name] on 12/20/2021 15:25:28 -------------------------------------------------------------------------------- Physician Orders Details Patient Name: Date of Service: Jane Nurse M. 12/20/2021 11:15 A M Medical Record Number: [id number] Patient Account Number: [id number] Date of Birth/Sex: Treating RN: 03-01-1966 (56 y.o. [name] Primary Care Provider: [name] Other Clinician: Referring Provider: Treating Provider/Extender: Carlus Pavlov in Treatment: 13 Verbal / Phone Orders: No Diagnosis Coding ICD-10 Coding Code Description L89.613 Pressure ulcer of right heel, stage 3 F25.9 Schizoaffective disorder, unspecified N18.6 End stage renal disease L89.610 Pressure ulcer of right heel, unstageable Follow-up Appointments ppointment in 1 week. - Dr. Heber [place] and [name], RN Room 8 Monday or Thursday next week. Return A Other: - ***Someone will call you to schedule the CT scan.*** Halo to wound ca wound care supply for calcium alginate Ag. Bathing/ Shower/ Hygiene May shower and wash wound with soap and water. - when changing dressing Edema Control - Lymphedema / SCD / Other Elevate legs to the level of the heart or above for 30 minutes daily and/or when sitting, a frequency of: - 3-4 times a day throughout the day. Off-Loading Other: - Prevalon Boot to right foot, avoid direct pressure to back of  heels. Additional Orders / Instructions Follow Nutritious Diet Wound Treatment Wound #2 - Calcaneus Wound Laterality: Right Cleanser: Soap and Water 1 x Per Day/30 Days Discharge Instructions: May shower and wash wound with dial antibacterial soap and water prior to dressing change. Peri-Wound Care: Zinc Oxide Ointment 30g tube 1 x Per Day/30 Days Discharge Instructions: Apply Zinc Oxide around the wound bed with each dressing change Prim Dressing: KerraCel Ag Gelling Fiber Dressing, 2x2 in (silver alginate) 1 x Per Day/30 Days ary Discharge Instructions: Apply silver alginate over the santyl into undermining  and wound bed. Prim Dressing: Santyl Ointment 1 x Per Day/30 Days ary Discharge Instructions: Apply nickel thick amount to wound bed as instructed Secondary Dressing: Woven Gauze Sponge, Non-Sterile 4x4 in 1 x Per Day/30 Days Discharge Instructions: Cover with gauze. Secondary Dressing: ABD Pad, 8x10 1 x Per Day/30 Days Discharge Instructions: Apply abd pad over the gauze. Secured With: [employer], 4.5x3.1 (in/yd) 1 x Per Day/30 Days Discharge Instructions: Secure with Kerlix as directed. Secured With: Paper Tape, 2x10 (in/yd) 1 x Per Day/30 Days Discharge Instructions: Secure dressing with tape as directed. Patient Medications llergies: No Known Allergies A Notifications Medication Indication Start End 12/20/2021 Augmentin DOSE 1 - oral 500 mg-125 mg tablet - 1 tablet oral BID x 7 days Electronic Signature(s) Signed: 12/20/2021 3:28:48 PM By: [name] DO Previous Signature: 12/20/2021 11:26:13 AM Version By: [name] DO Entered By: [name] on 12/20/2021 15:25:46 -------------------------------------------------------------------------------- Problem List Details Patient Name: Date of Service: Jane Nurse M. 12/20/2021 11:15 A M Medical Record Number: [id number] Patient Account Number: [id number] Date of Birth/Sex: Treating  RN: May 12, 1966 (55 y.o. [name], [name] Primary Care Provider: [name] Other Clinician: Referring Provider: Treating Provider/Extender: Carlus Pavlov in Treatment: 13 Active Problems ICD-10 Encounter Code Description Active Date MDM Diagnosis L89.613 Pressure ulcer of right heel, stage 3 09/19/2021 No Yes F25.9 Schizoaffective disorder, unspecified 09/19/2021 No Yes N18.6 End stage renal disease 09/19/2021 No Yes L89.610 Pressure ulcer of right heel, unstageable 10/03/2021 No Yes Inactive Problems Resolved Problems Electronic Signature(s) Signed: 12/20/2021 3:28:48 PM By: [name] DO Entered By: [name] on 12/20/2021 15:19:51 -------------------------------------------------------------------------------- Progress Note Details Patient Name: Date of Service: Jane Nurse M. 12/20/2021 11:15 A M Medical Record Number: [id number] Patient Account Number: [id number] Date of Birth/Sex: Treating RN: 1965/12/24 (56 y.o. F) Primary Care Provider: [name] Other Clinician: Referring Provider: Treating Provider/Extender: Carlus Pavlov in Treatment: 13 Subjective Chief Complaint Information obtained from Patient Right heel wound History of Present Illness (HPI) Admission 09/19/2021 Jane [name] is a 56 year old female with a past medical history of schizoaffective disorder, end-stage renal disease on peritoneal dialysis and seizure disorder that presents to the clinic for a right heel wound that has been present since her hospitalization in April 2022 because of an epidural abscess that required surgery. During her hospitalization she developed the heel wound and a breast wound. She would lean more aggressively to the left side for long periods of time. The breast wound has healed. The right heel Has developed a loose callus. She is not mobile and is either in her wheelchair or in the  bed most of the day. She currently denies pain, increased warmth or erythema to the area or drainage. Son-in-law is present and helps with the history. Patient is unable to participate fully in her care due to her diagnosis of schizoaffective disorder. 10/03/2021; patient presents for follow-up. She has developed a new pressure injury to the calcaneus and has skin breakdown to the surrounding area. She has been using Hydrofera Blue to the previous wound site. She has no issues or complaints today. She denies signs of infection. 12/19; patient presents for follow-up. She has been using Hydrofera Blue to the wound sites. She went back and obtained TBI's. She currently denies signs of infection. 1/3; patient presents for follow-up. She has been using Hydrofera Blue to the medial wound and Santyl to the calcaneus wound. She has no issues or complaints today. She denies  signs of infection. 1/17; patient presents for follow-up. She has been using Hydrofera Blue to the medial wound along with Santyl. She been using Santyl to the calcaneus wound. She has no issues or complaints today. She denies signs of infection. 1/31; patient presents for follow-up. The medial wound remains closed. She has been using Santyl to the calcaneus wound. She has no issues or complaints today. She denies signs of infection. 2/7; patient presents for follow-up. She has been using Santyl to the calcaneus wound. The medial wound remains closed but does have callus surrounding this. She is wearing her Prevalon boots however takes these off pretty regularly. She currently denies signs of infection. 2/21; patient presents for follow-up. She continues to use Santyl to the calcaneus wound. She is unable to offload the right heel. Son states that the patient is often taking off the Prevalon boots and putting pressure on the heel. She currently denies signs of infection. 2/28; patient presents for follow-up. She continues to use Santyl to  the wound bed. She reports taking Augmentin with no issues. She denies systemic signs of infection. It is unclear if she can truly offload this wound. Her CT scan has not been scheduled yet. Patient History Information obtained from Patient, Caregiver. Family History Cancer - Mother, Kidney Disease - Maternal Grandparents, No family history of Diabetes, Heart Disease, Hereditary Spherocytosis, Hypertension, Lung Disease, Seizures, Stroke, Thyroid Problems, Tuberculosis. Social History Never smoker, Marital Status - Single, Alcohol Use - Never, Drug Use - No History, Caffeine Use - Rarely. Medical History Eyes Patient has history of Cataracts Hematologic/Lymphatic Patient has history of Anemia Cardiovascular Patient has history of Congestive Heart Failure, Hypotension Denies history of Hypertension Genitourinary Patient has history of End Stage Renal Disease - Dialysis Neurologic Patient has history of Seizure Disorder - last seizure several years ago Medical A Surgical History Notes nd Endocrine Hypothyroidism Musculoskeletal Laminectomy 05/2021 Psychiatric Schizoaffective Disorder Objective Constitutional respirations regular, non-labored and within target range for patient.. Vitals Time Taken: 10:59 AM, Height: 65 in, Weight: 130 lbs, BMI: 21.6, Temperature: 98.2 F, Pulse: 128 bpm, Respiratory Rate: 22 breaths/min, Blood Pressure: 125/84 mmHg. Cardiovascular 2+ dorsalis pedis/posterior tibialis pulses. Psychiatric pleasant and cooperative. General Notes: T the right heel there is an open wound with nonviable tissue throughout, close to bone. Circumferential maceration No surrounding signs of o infection. Integumentary (Hair, Skin) Wound #2 status is Open. Original cause of wound was Pressure Injury. The date acquired was: 10/03/2021. The wound has been in treatment 11 weeks. The wound is located on the Right Calcaneus. The wound measures 1.5cm length x 0.8cm width x  0.6cm depth; 0.942cm^2 area and 0.565cm^3 volume. There is Fat Layer (Subcutaneous Tissue) exposed. There is no tunneling noted, however, there is undermining starting at 11:00 and ending at 1:00 with a maximum distance of 1cm. There is a medium amount of serosanguineous drainage noted. The wound margin is distinct with the outline attached to the wound base. There is small (1-33%) red granulation within the wound bed. There is a large (67-100%) amount of necrotic tissue within the wound bed including Adherent Slough. Assessment Active Problems ICD-10 Pressure ulcer of right heel, stage 3 Schizoaffective disorder, unspecified End stage renal disease Pressure ulcer of right heel, unstageable Patient's wound has shown slight improvement in appearance but continues to have nonviable tissue throughout. There is circumferential maceration noted. I recommended continuing Santyl and adding silver alginate to help address the drainage. No obvious signs of infection on exam however she is high risk  for infection and it is unclear if she is able to offload this heel due to dementia. I will continue Augmentin for another week. Her CT and scan has been ordered however has not been scheduled yet. Follow-up in 1 week Procedures Wound #2 Pre-procedure diagnosis of Wound #2 is a Pressure Ulcer located on the Right Calcaneus . There was a Chemical/Enzymatic/Mechanical debridement performed by Deon Pilling, RN.Marland Kitchen Agent used was [employer]. There was no bleeding. The procedure was tolerated well. Post Debridement Measurements: 1.5cm length x 0.8cm width x 0.6cm depth; 0.565cm^3 volume. Post debridement Stage noted as Unstageable/Unclassified. Character of Wound/Ulcer Post Debridement requires further debridement. Post procedure Diagnosis Wound #2: Same as Pre-Procedure Plan Follow-up Appointments: Return Appointment in 1 week. - Dr. Heber [place] and [name], RN Room 8 Monday or Thursday next week. Other: - ***Someone  will call you to schedule the CT scan.*** Halo to wound ca wound care supply for calcium alginate Ag. Bathing/ Shower/ Hygiene: May shower and wash wound with soap and water. - when changing dressing Edema Control - Lymphedema / SCD / Other: Elevate legs to the level of the heart or above for 30 minutes daily and/or when sitting, a frequency of: - 3-4 times a day throughout the day. Off-Loading: Other: - Prevalon Boot to right foot, avoid direct pressure to back of heels. Additional Orders / Instructions: Follow Nutritious Diet The following medication(s) was prescribed: Augmentin oral 500 mg-125 mg tablet 1 1 tablet oral BID x 7 days starting 12/20/2021 WOUND #2: - Calcaneus Wound Laterality: Right Cleanser: Soap and Water 1 x Per Day/30 Days Discharge Instructions: May shower and wash wound with dial antibacterial soap and water prior to dressing change. Peri-Wound Care: Zinc Oxide Ointment 30g tube 1 x Per Day/30 Days Discharge Instructions: Apply Zinc Oxide around the wound bed with each dressing change Prim Dressing: KerraCel Ag Gelling Fiber Dressing, 2x2 in (silver alginate) 1 x Per Day/30 Days ary Discharge Instructions: Apply silver alginate over the santyl into undermining and wound bed. Prim Dressing: Santyl Ointment 1 x Per Day/30 Days ary Discharge Instructions: Apply nickel thick amount to wound bed as instructed Secondary Dressing: Woven Gauze Sponge, Non-Sterile 4x4 in 1 x Per Day/30 Days Discharge Instructions: Cover with gauze. Secondary Dressing: ABD Pad, 8x10 1 x Per Day/30 Days Discharge Instructions: Apply abd pad over the gauze. Secured With: [employer], 4.5x3.1 (in/yd) 1 x Per Day/30 Days Discharge Instructions: Secure with Kerlix as directed. Secured With: Paper T ape, 2x10 (in/yd) 1 x Per Day/30 Days Discharge Instructions: Secure dressing with tape as directed. 1. Santyl 2. Aggressive offloading 3. Continue Augmentin 4. Follow-up in 1  week Electronic Signature(s) Signed: 12/20/2021 3:28:48 PM By: [name] DO Entered By: [name] on 12/20/2021 15:28:09 -------------------------------------------------------------------------------- HxROS Details Patient Name: Date of Service: Jane Nurse M. 12/20/2021 11:15 A M Medical Record Number: [id number] Patient Account Number: [id number] Date of Birth/Sex: Treating RN: 10/10/1966 (56 y.o. F) Primary Care Provider: [name] Other Clinician: Referring Provider: Treating Provider/Extender: Carlus Pavlov in Treatment: 13 Information Obtained From Patient Caregiver Eyes Medical History: Positive for: Cataracts Hematologic/Lymphatic Medical History: Positive for: Anemia Cardiovascular Medical History: Positive for: Congestive Heart Failure; Hypotension Negative for: Hypertension Endocrine Medical History: Past Medical History Notes: Hypothyroidism Genitourinary Medical History: Positive for: End Stage Renal Disease - Dialysis Musculoskeletal Medical History: Past Medical History Notes: Laminectomy 05/2021 Neurologic Medical History: Positive for: Seizure Disorder - last seizure several years ago Psychiatric Medical History: Past Medical History  Notes: Schizoaffective Disorder HBO Extended History Items Eyes: Cataracts Immunizations Pneumococcal Vaccine: Received Pneumococcal Vaccination: No Implantable Devices Yes Family and Social History Cancer: Yes - Mother; Diabetes: No; Heart Disease: No; Hereditary Spherocytosis: No; Hypertension: No; Kidney Disease: Yes - Maternal Grandparents; Lung Disease: No; Seizures: No; Stroke: No; Thyroid Problems: No; Tuberculosis: No; Never smoker; Marital Status - Single; Alcohol Use: Never; Drug Use: No History; Caffeine Use: Rarely; Financial Concerns: No; Food, Clothing or Shelter Needs: No; Support System Lacking: No; Transportation Concerns: No Electronic  Signature(s) Signed: 12/20/2021 3:28:48 PM By: [name] DO Entered By: [name] on 12/20/2021 15:21:09 -------------------------------------------------------------------------------- SuperBill Details Patient Name: Date of Service: Jane Nurse M. 12/20/2021 Medical Record Number: [id number] Patient Account Number: [id number] Date of Birth/Sex: Treating RN: 1966/09/29 (56 y.o. [name] Primary Care Provider: [name] Other Clinician: Referring Provider: Treating Provider/Extender: Carlus Pavlov in Treatment: 13 Diagnosis Coding ICD-10 Codes Code Description [phone] Pressure ulcer of right heel, stage 3 F25.9 Schizoaffective disorder, unspecified N18.6 End stage renal disease L89.610 Pressure ulcer of right heel, unstageable Facility Procedures CPT4 Code: [id number] Description: [id number] - DEBRIDE W/O ANES NON SELECT Modifier: Quantity: 1 Physician Procedures : CPT4 Code Description Modifier [id number] - WC PHYS LEVEL 4 - EST PT ICD-10 Diagnosis Description L89.613 Pressure ulcer of right heel, stage 3 F25.9 Schizoaffective disorder, unspecified N18.6 End stage renal disease L89.610 Pressure ulcer of right  heel, unstageable Quantity: 1 Electronic Signature(s) Signed: 12/20/2021 3:28:48 PM By: [name] DO Previous Signature: 12/20/2021 3:24:27 PM Version By: Deon Pilling RN, BSN Entered By: [name] on 12/20/2021 67:34:19

## 2021-12-20 NOTE — Progress Notes (Addendum)
[name], [name] ([id number]) Visit Report for 12/20/2021 Arrival Information Details Patient Name: Date of Service: Jane [name], Jane [name]. 12/20/2021 11:15 A M Medical Record Number: [id number] Patient Account Number: [id number] Date of Birth/Sex: Treating RN: [birth year] (56 y.o. F) Primary Care Rigdon Macomber: [name] Other Clinician: Referring Aurther Harlin: Treating Jabar Krysiak/Extender: Carlus Pavlov in Treatment: 13 Visit Information History Since Last Visit Added or deleted any medications: No Patient Arrived: Wheel Chair Any new allergies or adverse reactions: No Arrival Time: 10:58 Had a fall or experienced change in No Accompanied By: brother activities of daily living that may affect Transfer Assistance: EasyPivot Patient Lift risk of falls: Patient Identification Verified: Yes Signs or symptoms of abuse/neglect since last visito No Secondary Verification Process Completed: Yes Hospitalized since last visit: No Patient Requires Transmission-Based Precautions: No Implantable device outside of the clinic excluding No Patient Has Alerts: Yes cellular tissue based products placed in the center Patient Alerts: R ABI=Non Comp since last visit: Has Dressing in Place as Prescribed: Yes Pain Present Now: No Electronic Signature(s) Signed: 12/20/2021 11:05:24 AM By: Sandre Kitty Entered By: Sandre Kitty on 12/20/2021 10:59:10 -------------------------------------------------------------------------------- Encounter Discharge Information Details Patient Name: Date of Service: Jane Nurse M. 12/20/2021 11:15 A M Medical Record Number: [id number] Patient Account Number: [id number] Date of Birth/Sex: Treating RN: 1966-10-23 (56 y.o. [name], [name] Primary Care Myleka Moncure: [name] Other Clinician: Referring Vennela Jutte: Treating Zyra Parrillo/Extender: Carlus Pavlov in Treatment: 13 Encounter Discharge  Information Items Post Procedure Vitals Discharge Condition: Stable Temperature (F): 98.2 Ambulatory Status: Wheelchair Pulse (bpm): 128 Discharge Destination: Home Respiratory Rate (breaths/min): 20 Transportation: Private Auto Blood Pressure (mmHg): 125/84 Accompanied By: family member Schedule Follow-up Appointment: Yes Clinical Summary of Care: Electronic Signature(s) Signed: 12/20/2021 3:24:27 PM By: Deon Pilling RN, BSN Entered By: Deon Pilling on 12/20/2021 11:26:37 -------------------------------------------------------------------------------- Lower Extremity Assessment Details Patient Name: Date of Service: Jane Nurse M. 12/20/2021 11:15 A M Medical Record Number: [id number] Patient Account Number: [id number] Date of Birth/Sex: Treating RN: 1965/12/16 (56 y.o. Jane [name] Primary Care Patriciaann Rabanal: [name] Other Clinician: Referring Fenix Rorke: Treating Haili Donofrio/Extender: Carlus Pavlov in Treatment: 13 Edema Assessment Assessed: [name]: No] Patrice Paradise: Yes] Edema: [Left: Ye] [Right: s] Calf Left: Right: Point of Measurement: 30 cm From Medial Instep 34 cm Ankle Left: Right: Point of Measurement: 7 cm From Medial Instep 22 cm Vascular Assessment Pulses: Dorsalis Pedis Palpable: [Right:Yes] Electronic Signature(s) Signed: 12/20/2021 3:24:27 PM By: Deon Pilling RN, BSN Entered By: Deon Pilling on 12/20/2021 11:14:12 -------------------------------------------------------------------------------- Multi Wound Chart Details Patient Name: Date of Service: Jane Nurse M. 12/20/2021 11:15 A M Medical Record Number: [id number] Patient Account Number: [id number] Date of Birth/Sex: Treating RN: Jun 06, 1966 (56 y.o. F) Primary Care Janvi Ammar: [name] Other Clinician: Referring Kuper Rennels: Treating Felicita Nuncio/Extender: Carlus Pavlov in Treatment: 13 Vital Signs Height(in):  65 Pulse(bpm): 128 Weight(lbs): 130 Blood Pressure(mmHg): 125/84 Body Mass Index(BMI): 21.6 Temperature(F): 98.2 Respiratory Rate(breaths/min): 22 Photos: [2:Right Calcaneus] [N/A:N/A N/A] Wound Location: [2:Pressure Injury] [N/A:N/A] Wounding Event: [2:Pressure Ulcer] [N/A:N/A] Primary Etiology: [2:Cataracts, Anemia, Congestive Heart N/A] Comorbid History: [2:Failure, Hypotension, End Stage Renal Disease, Seizure Disorder 10/03/2021] [N/A:N/A] Date Acquired: [2:11] [N/A:N/A] Weeks of Treatment: [2:Open] [N/A:N/A] Wound Status: [2:No] [N/A:N/A] Wound Recurrence: [2:1.5x0.8x0.6] [N/A:N/A] Measurements L x W x D (cm) [2:0.942] [N/A:N/A] A (cm) : rea [2:0.565] [N/A:N/A] Volume (cm) : [2:82.20%] [N/A:N/A] % Reduction in A [2:rea: -7.00%] [N/A:N/A] % Reduction in Volume: [2:11] Starting Position 1 (o'clock): [  2:1] Ending Position 1 (o'clock): [2:1] Maximum Distance 1 (cm): [2:Yes] [N/A:N/A] Undermining: [2:Unstageable/Unclassified] [N/A:N/A] Classification: [2:Medium] [N/A:N/A] Exudate A mount: [2:Serosanguineous] [N/A:N/A] Exudate Type: [2:red, brown] [N/A:N/A] Exudate Color: [2:Distinct, outline attached] [N/A:N/A] Wound Margin: [2:Small (1-33%)] [N/A:N/A] Granulation A mount: [2:Red] [N/A:N/A] Granulation Quality: [2:Large (67-100%)] [N/A:N/A] Necrotic A mount: [2:Fat Layer (Subcutaneous Tissue): Yes N/A] Exposed Structures: [2:Fascia: No Tendon: No Muscle: No Joint: No Bone: No Small (1-33%)] [N/A:N/A] Epithelialization: [2:Chemical/Enzymatic/Mechanical] [N/A:N/A] Debridement: [2:N/A] [N/A:N/A] Instrument: [2:None] [N/A:N/A] Bleeding: Debridement Treatment Response: Procedure was tolerated well [N/A:N/A] Post Debridement Measurements L x 1.5x0.8x0.6 [N/A:N/A] W x D (cm) [2:0.565] [N/A:N/A] Post Debridement Volume: (cm) [2:Unstageable/Unclassified] [N/A:N/A] Post Debridement Stage: [2:Debridement] [N/A:N/A] Treatment Notes Wound #2 (Calcaneus) Wound Laterality:  Right Cleanser Soap and Water Discharge Instruction: May shower and wash wound with dial antibacterial soap and water prior to dressing change. Peri-Wound Care Zinc Oxide Ointment 30g tube Discharge Instruction: Apply Zinc Oxide around the wound bed with each dressing change Topical Primary Dressing KerraCel Ag Gelling Fiber Dressing, 2x2 in (silver alginate) Discharge Instruction: Apply silver alginate over the santyl into undermining and wound bed. Santyl Ointment Discharge Instruction: Apply nickel thick amount to wound bed as instructed Secondary Dressing Woven Gauze Sponge, Non-Sterile 4x4 in Discharge Instruction: Cover with gauze. ABD Pad, 8x10 Discharge Instruction: Apply abd pad over the gauze. Secured With [employer], 4.5x3.1 (in/yd) Discharge Instruction: Secure with Kerlix as directed. 29M Medipore H Soft Cloth Surgical T ape, 4 x 10 (in/yd) Discharge Instruction: Secure with tape as directed. Compression Wrap Compression Stockings Add-Ons Electronic Signature(s) Signed: 12/20/2021 3:28:48 PM By: [name] DO Entered By: [name] on 12/20/2021 15:19:57 -------------------------------------------------------------------------------- Multi-Disciplinary Care Plan Details Patient Name: Date of Service: Jane Nurse M. 12/20/2021 11:15 A M Medical Record Number: [id number] Patient Account Number: [id number] Date of Birth/Sex: Treating RN: 08/23/1966 (56 y.o. [name], [name] Primary Care Angeligue Bowne: [name] Other Clinician: Referring Ieesha Abbasi: Treating Krystel Fletchall/Extender: Carlus Pavlov in Treatment: 13 Active Inactive Pressure Nursing Diagnoses: Knowledge deficit related to causes and risk factors for pressure ulcer development Goals: Patient will remain free from development of additional pressure ulcers Date Initiated: 09/19/2021 Target Resolution Date: 01/20/2022 Goal Status:  Active Interventions: Assess: immobility, friction, shearing, incontinence upon admission and as needed Assess offloading mechanisms upon admission and as needed Treatment Activities: Pressure reduction/relief device ordered : 09/19/2021 T ordered outside of clinic : 09/19/2021 est Notes: Wound/Skin Impairment Nursing Diagnoses: Impaired tissue integrity Goals: Patient/caregiver will verbalize understanding of skin care regimen Date Initiated: 09/19/2021 Target Resolution Date: 01/20/2022 Goal Status: Active Ulcer/skin breakdown will have a volume reduction of 30% by week 4 Date Initiated: 09/19/2021 Date Inactivated: 10/25/2021 Target Resolution Date: 10/17/2021 Goal Status: Met Ulcer/skin breakdown will have a volume reduction of 50% by week 8 Date Initiated: 10/25/2021 Date Inactivated: 12/13/2021 Target Resolution Date: 12/16/2021 Unmet Reason: see wound Goal Status: Unmet measurements. Continue offloading and current treatment plan. Interventions: Assess patient/caregiver ability to obtain necessary supplies Assess patient/caregiver ability to perform ulcer/skin care regimen upon admission and as needed Assess ulceration(s) every visit Provide education on ulcer and skin care Treatment Activities: Topical wound management initiated : 09/19/2021 Notes: Electronic Signature(s) Signed: 12/20/2021 3:24:27 PM By: Deon Pilling RN, BSN Entered By: Deon Pilling on 12/20/2021 11:06:05 -------------------------------------------------------------------------------- Pain Assessment Details Patient Name: Date of Service: Jane Nurse M. 12/20/2021 11:15 A M Medical Record Number: [id number] Patient Account Number: [id number] Date of Birth/Sex: Treating RN: 04-Apr-1966 (56 y.o. F) Primary Care Bergen Melle: [name]  Other Clinician: Referring Diannie Willner: Treating Cyncere Ruhe/Extender: Carlus Pavlov in Treatment: 13 Active Problems Location of  Pain Severity and Description of Pain Patient Has Paino No Site Locations Pain Management and Medication Current Pain Management: Electronic Signature(s) Signed: 12/20/2021 11:05:24 AM By: Sandre Kitty Entered By: Sandre Kitty on 12/20/2021 10:59:30 -------------------------------------------------------------------------------- Patient/Caregiver Education Details Patient Name: Date of Service: Samule Dry 2/28/2023andnbsp11:15 [place] Record Number: [id number] Patient Account Number: [id number] Date of Birth/Gender: Treating RN: 1966/03/26 (56 y.o. Jane [name] Primary Care Physician: [name] Other Clinician: Referring Physician: Treating Physician/Extender: Carlus Pavlov in Treatment: 13 Education Assessment Education Provided To: Patient Education Topics Provided Wound/Skin Impairment: Handouts: Skin Care Do's and Dont's Methods: Explain/Verbal Responses: Reinforcements needed Electronic Signature(s) Signed: 12/20/2021 3:24:27 PM By: Deon Pilling RN, BSN Entered By: Deon Pilling on 12/20/2021 11:06:17 -------------------------------------------------------------------------------- Wound Assessment Details Patient Name: Date of Service: Jane Nurse M. 12/20/2021 11:15 A M Medical Record Number: [id number] Patient Account Number: [id number] Date of Birth/Sex: Treating RN: 1966-10-04 (56 y.o. F) Primary Care Elga Santy: [name] Other Clinician: Referring Rashauna Tep: Treating Imre Vecchione/Extender: Carlus Pavlov in Treatment: 13 Wound Status Wound Number: 2 Primary Pressure Ulcer Etiology: Wound Location: Right Calcaneus Wound Open Wounding Event: Pressure Injury Status: Date Acquired: 10/03/2021 Comorbid Cataracts, Anemia, Congestive Heart Failure, Hypotension, End Weeks Of Treatment: 11 History: Stage Renal Disease, Seizure Disorder Clustered Wound:  No Photos Wound Measurements Length: (cm) 1.5 Width: (cm) 0.8 Depth: (cm) 0.6 Area: (cm) 0.942 Volume: (cm) 0.565 % Reduction in Area: 82.2% % Reduction in Volume: -7% Epithelialization: Small (1-33%) Tunneling: No Undermining: Yes Starting Position (o'clock): 11 Ending Position (o'clock): 1 Maximum Distance: (cm) 1 Wound Description Classification: Unstageable/Unclassified Wound Margin: Distinct, outline attached Exudate Amount: Medium Exudate Type: Serosanguineous Exudate Color: red, brown Foul Odor After Cleansing: No Slough/Fibrino Yes Wound Bed Granulation Amount: Small (1-33%) Exposed Structure Granulation Quality: Red Fascia Exposed: No Necrotic Amount: Large (67-100%) Fat Layer (Subcutaneous Tissue) Exposed: Yes Necrotic Quality: Adherent Slough Tendon Exposed: No Muscle Exposed: No Joint Exposed: No Bone Exposed: No Treatment Notes Wound #2 (Calcaneus) Wound Laterality: Right Cleanser Soap and Water Discharge Instruction: May shower and wash wound with dial antibacterial soap and water prior to dressing change. Peri-Wound Care Zinc Oxide Ointment 30g tube Discharge Instruction: Apply Zinc Oxide around the wound bed with each dressing change Topical Primary Dressing KerraCel Ag Gelling Fiber Dressing, 2x2 in (silver alginate) Discharge Instruction: Apply silver alginate over the santyl into undermining and wound bed. Santyl Ointment Discharge Instruction: Apply nickel thick amount to wound bed as instructed Secondary Dressing Woven Gauze Sponge, Non-Sterile 4x4 in Discharge Instruction: Cover with gauze. ABD Pad, 8x10 Discharge Instruction: Apply abd pad over the gauze. Secured With [employer], 4.5x3.1 (in/yd) Discharge Instruction: Secure with Kerlix as directed. 93M Medipore H Soft Cloth Surgical T ape, 4 x 10 (in/yd) Discharge Instruction: Secure with tape as directed. Compression Wrap Compression Stockings Add-Ons Electronic  Signature(s) Signed: 12/20/2021 3:24:27 PM By: Deon Pilling RN, BSN Previous Signature: 12/20/2021 11:05:24 AM Version By: Sandre Kitty Entered By: Deon Pilling on 12/20/2021 11:14:33 -------------------------------------------------------------------------------- Vitals Details Patient Name: Date of Service: Jane Nurse M. 12/20/2021 11:15 A M Medical Record Number: [id number] Patient Account Number: [id number] Date of Birth/Sex: Treating RN: [birth year] (56 y.o. F) Primary Care Jeferson Boozer: [name] Other Clinician: Referring Dwayne Begay: Treating Sequoia Witz/Extender: Carlus Pavlov in Treatment: 13 Vital Signs Time Taken: 10:59 Temperature (F): 98.2 Height (  in): 65 Pulse (bpm): 128 Weight (lbs): 130 Respiratory Rate (breaths/min): 22 Body Mass Index (BMI): 21.6 Blood Pressure (mmHg): 125/84 Reference Range: 80 - 120 mg / dl Electronic Signature(s) Signed: 12/20/2021 11:05:24 AM By: Sandre Kitty Entered By: Sandre Kitty on 12/20/2021 10:59:25

## 2021-12-21 DIAGNOSIS — N186 End stage renal disease: Secondary | ICD-10-CM | POA: Diagnosis not present

## 2021-12-21 DIAGNOSIS — D631 Anemia in chronic kidney disease: Secondary | ICD-10-CM | POA: Diagnosis not present

## 2021-12-21 DIAGNOSIS — L89613 Pressure ulcer of right heel, stage 3: Secondary | ICD-10-CM | POA: Diagnosis not present

## 2021-12-21 DIAGNOSIS — I959 Hypotension, unspecified: Secondary | ICD-10-CM | POA: Diagnosis not present

## 2021-12-21 DIAGNOSIS — N2581 Secondary hyperparathyroidism of renal origin: Secondary | ICD-10-CM | POA: Diagnosis not present

## 2021-12-21 DIAGNOSIS — I5020 Unspecified systolic (congestive) heart failure: Secondary | ICD-10-CM | POA: Diagnosis not present

## 2021-12-21 DIAGNOSIS — R627 Adult failure to thrive: Secondary | ICD-10-CM | POA: Diagnosis not present

## 2021-12-22 DIAGNOSIS — N2581 Secondary hyperparathyroidism of renal origin: Secondary | ICD-10-CM | POA: Diagnosis not present

## 2021-12-22 DIAGNOSIS — D631 Anemia in chronic kidney disease: Secondary | ICD-10-CM | POA: Diagnosis not present

## 2021-12-22 DIAGNOSIS — N186 End stage renal disease: Secondary | ICD-10-CM | POA: Diagnosis not present

## 2021-12-23 DIAGNOSIS — N186 End stage renal disease: Secondary | ICD-10-CM | POA: Diagnosis not present

## 2021-12-23 DIAGNOSIS — N2581 Secondary hyperparathyroidism of renal origin: Secondary | ICD-10-CM | POA: Diagnosis not present

## 2021-12-23 DIAGNOSIS — D631 Anemia in chronic kidney disease: Secondary | ICD-10-CM | POA: Diagnosis not present

## 2021-12-24 DIAGNOSIS — N2581 Secondary hyperparathyroidism of renal origin: Secondary | ICD-10-CM | POA: Diagnosis not present

## 2021-12-24 DIAGNOSIS — N186 End stage renal disease: Secondary | ICD-10-CM | POA: Diagnosis not present

## 2021-12-24 DIAGNOSIS — D631 Anemia in chronic kidney disease: Secondary | ICD-10-CM | POA: Diagnosis not present

## 2021-12-25 DIAGNOSIS — N186 End stage renal disease: Secondary | ICD-10-CM | POA: Diagnosis not present

## 2021-12-25 DIAGNOSIS — D631 Anemia in chronic kidney disease: Secondary | ICD-10-CM | POA: Diagnosis not present

## 2021-12-25 DIAGNOSIS — N2581 Secondary hyperparathyroidism of renal origin: Secondary | ICD-10-CM | POA: Diagnosis not present

## 2021-12-26 DIAGNOSIS — I959 Hypotension, unspecified: Secondary | ICD-10-CM | POA: Diagnosis not present

## 2021-12-26 DIAGNOSIS — I5020 Unspecified systolic (congestive) heart failure: Secondary | ICD-10-CM | POA: Diagnosis not present

## 2021-12-26 DIAGNOSIS — N2581 Secondary hyperparathyroidism of renal origin: Secondary | ICD-10-CM | POA: Diagnosis not present

## 2021-12-26 DIAGNOSIS — R627 Adult failure to thrive: Secondary | ICD-10-CM | POA: Diagnosis not present

## 2021-12-26 DIAGNOSIS — D631 Anemia in chronic kidney disease: Secondary | ICD-10-CM | POA: Diagnosis not present

## 2021-12-26 DIAGNOSIS — N186 End stage renal disease: Secondary | ICD-10-CM | POA: Diagnosis not present

## 2021-12-26 DIAGNOSIS — L89613 Pressure ulcer of right heel, stage 3: Secondary | ICD-10-CM | POA: Diagnosis not present

## 2021-12-27 ENCOUNTER — Ambulatory Visit (HOSPITAL_COMMUNITY)
Admission: RE | Admit: 2021-12-27 | Discharge: 2021-12-27 | Disposition: A | Discharge status: Home / Self Care | Payer: Medicare Other | Source: Ambulatory Visit | Attending: Internal Medicine | Admitting: Internal Medicine

## 2021-12-27 ENCOUNTER — Other Ambulatory Visit: Payer: Self-pay

## 2021-12-27 DIAGNOSIS — L89610 Pressure ulcer of right heel, unstageable: Secondary | ICD-10-CM | POA: Diagnosis not present

## 2021-12-27 DIAGNOSIS — N2581 Secondary hyperparathyroidism of renal origin: Secondary | ICD-10-CM | POA: Diagnosis not present

## 2021-12-27 DIAGNOSIS — D631 Anemia in chronic kidney disease: Secondary | ICD-10-CM | POA: Diagnosis not present

## 2021-12-27 DIAGNOSIS — N186 End stage renal disease: Secondary | ICD-10-CM | POA: Diagnosis not present

## 2021-12-28 DIAGNOSIS — I959 Hypotension, unspecified: Secondary | ICD-10-CM | POA: Diagnosis not present

## 2021-12-28 DIAGNOSIS — L89613 Pressure ulcer of right heel, stage 3: Secondary | ICD-10-CM | POA: Diagnosis not present

## 2021-12-28 DIAGNOSIS — N2581 Secondary hyperparathyroidism of renal origin: Secondary | ICD-10-CM | POA: Diagnosis not present

## 2021-12-28 DIAGNOSIS — R627 Adult failure to thrive: Secondary | ICD-10-CM | POA: Diagnosis not present

## 2021-12-28 DIAGNOSIS — I5020 Unspecified systolic (congestive) heart failure: Secondary | ICD-10-CM | POA: Diagnosis not present

## 2021-12-28 DIAGNOSIS — D631 Anemia in chronic kidney disease: Secondary | ICD-10-CM | POA: Diagnosis not present

## 2021-12-28 DIAGNOSIS — N186 End stage renal disease: Secondary | ICD-10-CM | POA: Diagnosis not present

## 2021-12-29 ENCOUNTER — Other Ambulatory Visit: Payer: Self-pay

## 2021-12-29 ENCOUNTER — Encounter (HOSPITAL_BASED_OUTPATIENT_CLINIC_OR_DEPARTMENT_OTHER): Payer: Medicare Other | Attending: Internal Medicine | Admitting: General Surgery

## 2021-12-29 DIAGNOSIS — I509 Heart failure, unspecified: Secondary | ICD-10-CM | POA: Diagnosis not present

## 2021-12-29 DIAGNOSIS — Z992 Dependence on renal dialysis: Secondary | ICD-10-CM | POA: Diagnosis not present

## 2021-12-29 DIAGNOSIS — L89613 Pressure ulcer of right heel, stage 3: Secondary | ICD-10-CM | POA: Insufficient documentation

## 2021-12-29 DIAGNOSIS — N186 End stage renal disease: Secondary | ICD-10-CM | POA: Insufficient documentation

## 2021-12-29 DIAGNOSIS — F259 Schizoaffective disorder, unspecified: Secondary | ICD-10-CM | POA: Insufficient documentation

## 2021-12-29 DIAGNOSIS — D631 Anemia in chronic kidney disease: Secondary | ICD-10-CM | POA: Diagnosis not present

## 2021-12-29 DIAGNOSIS — E039 Hypothyroidism, unspecified: Secondary | ICD-10-CM | POA: Insufficient documentation

## 2021-12-29 DIAGNOSIS — L89610 Pressure ulcer of right heel, unstageable: Secondary | ICD-10-CM | POA: Insufficient documentation

## 2021-12-29 DIAGNOSIS — N2581 Secondary hyperparathyroidism of renal origin: Secondary | ICD-10-CM | POA: Diagnosis not present

## 2021-12-29 NOTE — Progress Notes (Signed)
PIYA, MESCH ([id number]) Visit Report for 12/29/2021 Arrival Information Details Patient Name: Date of Service: Jane [name], Jane [name]. 12/29/2021 10:30 A M Medical Record Number: [id number] Patient Account Number: [id number] Date of Birth/Sex: Treating RN: [birth year] (56 y.o. [name], [name] Primary Care : [name] Other Clinician: Referring : Treating /Extender: Orson Gear in Treatment: 14 Visit Information History Since Last Visit All ordered tests and consults were completed: Yes Patient Arrived: Wheel Chair Added or deleted any medications: No Arrival Time: 11:04 Any new allergies or adverse reactions: No Accompanied By: brother in law Had a fall or experienced change in No Transfer Assistance: Manual activities of daily living that may affect Patient Identification Verified: Yes risk of falls: Secondary Verification Process Completed: Yes Signs or symptoms of abuse/neglect since last visito No Patient Requires Transmission-Based Precautions: No Hospitalized since last visit: No Patient Has Alerts: Yes Implantable device outside of the clinic excluding No Patient Alerts: R ABI=Non Comp cellular tissue based products placed in the center since last visit: Has Dressing in Place as Prescribed: Yes Pain Present Now: No Electronic Signature(s) Signed: 12/29/2021 5:31:26 PM By: Deon Pilling RN, BSN Entered By: Deon Pilling on 12/29/2021 11:30:33 -------------------------------------------------------------------------------- Encounter Discharge Information Details Patient Name: Date of Service: Jane Nurse M. 12/29/2021 10:30 A M Medical Record Number: [id number] Patient Account Number: [id number] Date of Birth/Sex: Treating RN: 24-Nov-1965 (56 y.o. [name] Primary Care : [name] Other Clinician: Referring : Treating /Extender: Orson Gear in Treatment: 14 Encounter Discharge Information Items Post Procedure Vitals Discharge Condition: Stable Temperature (F): 97.5 Ambulatory Status: Wheelchair Pulse (bpm): 118 Discharge Destination: Home Respiratory Rate (breaths/min): 20 Transportation: Other Blood Pressure (mmHg): 130/86 Accompanied By: brother in law Schedule Follow-up Appointment: Yes Clinical Summary of Care: Electronic Signature(s) Signed: 12/29/2021 5:31:26 PM By: Deon Pilling RN, BSN Entered By: Deon Pilling on 12/29/2021 11:30:50 -------------------------------------------------------------------------------- Lower Extremity Assessment Details Patient Name: Date of Service: Jane Nurse M. 12/29/2021 10:30 A M Medical Record Number: [id number] Patient Account Number: [id number] Date of Birth/Sex: Treating RN: [birth year] (56 y.o. [name] Primary Care : [name] Other Clinician: Referring : Treating /Extender: Orson Gear in Treatment: 14 Edema Assessment Assessed: [name]: No] Patrice Paradise: Yes] Edema: [Left: Ye] [Right: s] Calf Left: Right: Point of Measurement: 30 cm From Medial Instep 34 cm Ankle Left: Right: Point of Measurement: 7 cm From Medial Instep 22 cm Vascular Assessment Pulses: Dorsalis Pedis Palpable: [Right:Yes] Electronic Signature(s) Signed: 12/29/2021 5:31:26 PM By: Deon Pilling RN, BSN Entered By: Deon Pilling on 12/29/2021 11:10:43 -------------------------------------------------------------------------------- Multi Wound Chart Details Patient Name: Date of Service: Jane Nurse M. 12/29/2021 10:30 A M Medical Record Number: [id number] Patient Account Number: [id number] Date of Birth/Sex: Treating RN: June 26, 1966 (56 y.o. F) Primary Care : [name] Other Clinician: Referring : Treating /Extender: Orson Gear in  Treatment: 14 Vital Signs Height(in): 65 Pulse(bpm): 118 Weight(lbs): 130 Blood Pressure(mmHg): 130/86 Body Mass Index(BMI): 21.6 Temperature(F): 97.5 Respiratory Rate(breaths/min): 20 Photos: [N/A:N/A] Right Calcaneus N/A N/A Wound Location: Pressure Injury N/A N/A Wounding Event: Pressure Ulcer N/A N/A Primary Etiology: Cataracts, Anemia, Congestive Heart N/A N/A Comorbid History: Failure, Hypotension, End Stage Renal Disease, Seizure Disorder 10/03/2021 N/A N/A Date Acquired: 12 N/A N/A Weeks of Treatment: Open N/A N/A Wound Status: No N/A N/A Wound Recurrence: 0.8x0.8x0.6 N/A N/A Measurements L x W x D (cm) 0.503 N/A N/A A (cm) : rea 0.302  N/A N/A Volume (cm) : 90.50% N/A N/A % Reduction in A rea: 42.80% N/A N/A % Reduction in Volume: 5 Starting Position 1 (o'clock): 7 Ending Position 1 (o'clock): 0.6 Maximum Distance 1 (cm): Yes N/A N/A Undermining: Unstageable/Unclassified N/A N/A Classification: Medium N/A N/A Exudate A mount: Serosanguineous N/A N/A Exudate Type: red, brown N/A N/A Exudate Color: Distinct, outline attached N/A N/A Wound Margin: Small (1-33%) N/A N/A Granulation A mount: Red N/A N/A Granulation Quality: Large (67-100%) N/A N/A Necrotic A mount: Fat Layer (Subcutaneous Tissue): Yes N/A N/A Exposed Structures: Fascia: No Tendon: No Muscle: No Joint: No Bone: No Small (1-33%) N/A N/A Epithelialization: Debridement - Excisional N/A N/A Debridement: Pre-procedure Verification/Time Out 11:20 N/A N/A Taken: Lidocaine 5% topical ointment N/A N/A Pain Control: Callus, Subcutaneous, Slough N/A N/A Tissue Debrided: Skin/Subcutaneous Tissue N/A N/A Level: 1 N/A N/A Debridement A (sq cm): rea Curette N/A N/A Instrument: Moderate N/A N/A Bleeding: Silver Nitrate N/A N/A Hemostasis A chieved: 0 N/A N/A Procedural Pain: 0 N/A N/A Post Procedural Pain: Procedure was tolerated well N/A N/A Debridement Treatment  Response: 0.8x0.8x0.6 N/A N/A Post Debridement Measurements L x W x D (cm) 0.302 N/A N/A Post Debridement Volume: (cm) Unstageable/Unclassified N/A N/A Post Debridement Stage: Debridement N/A N/A Procedures Performed: Treatment Notes Wound #2 (Calcaneus) Wound Laterality: Right Cleanser Soap and Water Discharge Instruction: May shower and wash wound with dial antibacterial soap and water prior to dressing change. Peri-Wound Care Zinc Oxide Ointment 30g tube Discharge Instruction: Apply Zinc Oxide around the wound bed with each dressing change Sween Lotion (Moisturizing lotion) Discharge Instruction: Apply moisturizing lotion as directed Topical Primary Dressing KerraCel Ag Gelling Fiber Dressing, 2x2 in (silver alginate) Discharge Instruction: Apply silver alginate over the santyl into undermining and wound bed. Santyl Ointment Discharge Instruction: Apply nickel thick amount to wound bed as instructed Secondary Dressing Woven Gauze Sponge, Non-Sterile 4x4 in Discharge Instruction: Cover with gauze. ABD Pad, 8x10 Discharge Instruction: Apply abd pad over the gauze. Secured With [employer], 4.5x3.1 (in/yd) Discharge Instruction: Secure with Kerlix as directed. Paper Tape, 2x10 (in/yd) Discharge Instruction: Secure dressing with tape as directed. Compression Wrap Compression Stockings Add-Ons Electronic Signature(s) Signed: 12/29/2021 12:29:10 PM By: Fredirick Maudlin MD FACS Entered By: Fredirick Maudlin on 12/29/2021 12:29:10 -------------------------------------------------------------------------------- Multi-Disciplinary Care Plan Details Patient Name: Date of Service: Jane Nurse M. 12/29/2021 10:30 A M Medical Record Number: [id number] Patient Account Number: [id number] Date of Birth/Sex: Treating RN: June 10, 1966 (56 y.o. [name], [name] Primary Care Mylan Lengyel: [name] Other Clinician: Referring Thijs Brunton: Treating Dash Cardarelli/Extender:  Orson Gear in Treatment: 14 Active Inactive Pressure Nursing Diagnoses: Knowledge deficit related to causes and risk factors for pressure ulcer development Goals: Patient will remain free from development of additional pressure ulcers Date Initiated: 09/19/2021 Target Resolution Date: 02/16/2022 Goal Status: Active Interventions: Assess: immobility, friction, shearing, incontinence upon admission and as needed Assess offloading mechanisms upon admission and as needed Treatment Activities: Pressure reduction/relief device ordered : 09/19/2021 T ordered outside of clinic : 09/19/2021 est Notes: Wound/Skin Impairment Nursing Diagnoses: Impaired tissue integrity Goals: Patient/caregiver will verbalize understanding of skin care regimen Date Initiated: 09/19/2021 Target Resolution Date: 02/17/2022 Goal Status: Active Ulcer/skin breakdown will have a volume reduction of 30% by week 4 Date Initiated: 09/19/2021 Date Inactivated: 10/25/2021 Target Resolution Date: 10/17/2021 Goal Status: Met Ulcer/skin breakdown will have a volume reduction of 50% by week 8 Date Initiated: 10/25/2021 Date Inactivated: 12/13/2021 Target Resolution Date: 12/16/2021 Unmet Reason: see wound Goal Status: Unmet  measurements. Continue offloading and current treatment plan. Interventions: Assess patient/caregiver ability to obtain necessary supplies Assess patient/caregiver ability to perform ulcer/skin care regimen upon admission and as needed Assess ulceration(s) every visit Provide education on ulcer and skin care Treatment Activities: Topical wound management initiated : 09/19/2021 Notes: Electronic Signature(s) Signed: 12/29/2021 5:31:26 PM By: Deon Pilling RN, BSN Entered By: Deon Pilling on 12/29/2021 11:23:07 -------------------------------------------------------------------------------- Pain Assessment Details Patient Name: Date of Service: Jane Nurse M.  12/29/2021 10:30 A M Medical Record Number: [id number] Patient Account Number: [id number] Date of Birth/Sex: Treating RN: 25-Aug-1966 (57 y.o. [name] Primary Care : [name] Other Clinician: Referring : Treating /Extender: Orson Gear in Treatment: 14 Active Problems Location of Pain Severity and Description of Pain Patient Has Paino No Site Locations Rate the pain. Current Pain Level: 0 Pain Management and Medication Current Pain Management: Medication: No Cold Application: No Rest: No Massage: No Activity: No T.E.N.S.: No Heat Application: No Leg drop or elevation: No Is the Current Pain Management Adequate: Adequate How does your wound impact your activities of daily livingo Sleep: No Bathing: No Appetite: No Relationship With Others: No Bladder Continence: No Emotions: No Bowel Continence: No Work: No Toileting: No Drive: No Dressing: No Hobbies: No Engineer, maintenance) Signed: 12/29/2021 5:31:26 PM By: Deon Pilling RN, BSN Entered By: Deon Pilling on 12/29/2021 11:05:36 -------------------------------------------------------------------------------- Patient/Caregiver Education Details Patient Name: Date of Service: Jane [name] 3/9/2023andnbsp10:30 [place] Record Number: [id number] Patient Account Number: [id number] Date of Birth/Gender: Treating RN: 1966-10-02 (56 y.o. [name] Primary Care Physician: [name] Other Clinician: Referring Physician: Treating Physician/Extender: Orson Gear in Treatment: 14 Education Assessment Education Provided To: Caregiver Education Topics Provided Wound/Skin Impairment: Handouts: Skin Care Do's and Dont's Methods: Explain/Verbal Responses: Reinforcements needed Electronic Signature(s) Signed: 12/29/2021 5:31:26 PM By: Deon Pilling RN, BSN Entered By: Deon Pilling on 12/29/2021  11:23:44 -------------------------------------------------------------------------------- Wound Assessment Details Patient Name: Date of Service: Jane Nurse M. 12/29/2021 10:30 A M Medical Record Number: [id number] Patient Account Number: [id number] Date of Birth/Sex: Treating RN: 1966-02-02 (56 y.o. [name], [name] Primary Care : [name] Other Clinician: Referring : Treating /Extender: Orson Gear in Treatment: 14 Wound Status Wound Number: 2 Primary Pressure Ulcer Etiology: Wound Location: Right Calcaneus Wound Open Wounding Event: Pressure Injury Status: Date Acquired: 10/03/2021 Comorbid Cataracts, Anemia, Congestive Heart Failure, Hypotension, End Weeks Of Treatment: 12 History: Stage Renal Disease, Seizure Disorder Clustered Wound: No Photos Wound Measurements Length: (cm) 0.8 Width: (cm) 0.8 Depth: (cm) 0.6 Area: (cm) 0.503 Volume: (cm) 0.302 % Reduction in Area: 90.5% % Reduction in Volume: 42.8% Epithelialization: Small (1-33%) Tunneling: No Undermining: Yes Starting Position (o'clock): 5 Ending Position (o'clock): 7 Maximum Distance: (cm) 0.6 Wound Description Classification: Unstageable/Unclassified Wound Margin: Distinct, outline attached Exudate Amount: Medium Exudate Type: Serosanguineous Exudate Color: red, brown Foul Odor After Cleansing: No Slough/Fibrino Yes Wound Bed Granulation Amount: Small (1-33%) Exposed Structure Granulation Quality: Red Fascia Exposed: No Necrotic Amount: Large (67-100%) Fat Layer (Subcutaneous Tissue) Exposed: Yes Necrotic Quality: Adherent Slough Tendon Exposed: No Muscle Exposed: No Joint Exposed: No Bone Exposed: No Treatment Notes Wound #2 (Calcaneus) Wound Laterality: Right Cleanser Soap and Water Discharge Instruction: May shower and wash wound with dial antibacterial soap and water prior to dressing change. Peri-Wound Care Zinc  Oxide Ointment 30g tube Discharge Instruction: Apply Zinc Oxide around the wound bed with each dressing change Sween Lotion (Moisturizing lotion)  Discharge Instruction: Apply moisturizing lotion as directed Topical Primary Dressing KerraCel Ag Gelling Fiber Dressing, 2x2 in (silver alginate) Discharge Instruction: Apply silver alginate over the santyl into undermining and wound bed. Santyl Ointment Discharge Instruction: Apply nickel thick amount to wound bed as instructed Secondary Dressing Woven Gauze Sponge, Non-Sterile 4x4 in Discharge Instruction: Cover with gauze. ABD Pad, 8x10 Discharge Instruction: Apply abd pad over the gauze. Secured With [employer], 4.5x3.1 (in/yd) Discharge Instruction: Secure with Kerlix as directed. Paper Tape, 2x10 (in/yd) Discharge Instruction: Secure dressing with tape as directed. Compression Wrap Compression Stockings Add-Ons Electronic Signature(s) Signed: 12/29/2021 5:18:04 PM By: Rhae Hammock RN Signed: 12/29/2021 5:31:26 PM By: Deon Pilling RN, BSN Entered By: Rhae Hammock on 12/29/2021 11:12:17 -------------------------------------------------------------------------------- Vitals Details Patient Name: Date of Service: Jane Nurse M. 12/29/2021 10:30 A M Medical Record Number: [id number] Patient Account Number: [id number] Date of Birth/Sex: Treating RN: 04/22/1966 (56 y.o. [name], [name] Primary Care Aniella Wandrey: [name] Other Clinician: Referring Ashleah Valtierra: Treating Pleas Carneal/Extender: Orson Gear in Treatment: 14 Vital Signs Time Taken: 11:00 Temperature (F): 97.5 Height (in): 65 Pulse (bpm): 118 Weight (lbs): 130 Respiratory Rate (breaths/min): 20 Body Mass Index (BMI): 21.6 Blood Pressure (mmHg): 130/86 Reference Range: 80 - 120 mg / dl Electronic Signature(s) Signed: 12/29/2021 5:31:26 PM By: Deon Pilling RN, BSN Entered By: Deon Pilling on 12/29/2021  11:05:20

## 2021-12-30 DIAGNOSIS — E782 Mixed hyperlipidemia: Secondary | ICD-10-CM | POA: Diagnosis not present

## 2021-12-30 DIAGNOSIS — E039 Hypothyroidism, unspecified: Secondary | ICD-10-CM | POA: Diagnosis not present

## 2021-12-30 DIAGNOSIS — M5430 Sciatica, unspecified side: Secondary | ICD-10-CM | POA: Diagnosis not present

## 2021-12-30 DIAGNOSIS — N2581 Secondary hyperparathyroidism of renal origin: Secondary | ICD-10-CM | POA: Diagnosis not present

## 2021-12-30 DIAGNOSIS — D631 Anemia in chronic kidney disease: Secondary | ICD-10-CM | POA: Diagnosis not present

## 2021-12-30 DIAGNOSIS — N186 End stage renal disease: Secondary | ICD-10-CM | POA: Diagnosis not present

## 2021-12-30 DIAGNOSIS — Z3042 Encounter for surveillance of injectable contraceptive: Secondary | ICD-10-CM | POA: Diagnosis not present

## 2021-12-30 DIAGNOSIS — D539 Nutritional anemia, unspecified: Secondary | ICD-10-CM | POA: Diagnosis not present

## 2021-12-30 DIAGNOSIS — R569 Unspecified convulsions: Secondary | ICD-10-CM | POA: Diagnosis not present

## 2021-12-30 NOTE — Progress Notes (Signed)
Jane, [name] ([id number]) Visit Report for 12/29/2021 Chief Complaint Document Details Patient Name: Date of Service: Jane [name], BU[place]. 12/29/2021 10:30 A M Medical Record Number: [id number] Patient Account Number: [id number] Date of Birth/Sex: Treating RN: Feb 20, 1966 (56 y.o. F) Primary Care Provider: [name] Other Clinician: Referring Provider: Treating Provider/Extender: Orson Gear in Treatment: 14 Information Obtained from: Patient Chief Complaint Right heel wound Electronic Signature(s) Signed: 12/29/2021 12:29:20 PM By: Fredirick Maudlin MD FACS Entered By: Fredirick Maudlin on 12/29/2021 12:29:19 -------------------------------------------------------------------------------- Debridement Details Patient Name: Date of Service: Jane [name] Nurse M. 12/29/2021 10:30 A M Medical Record Number: [id number] Patient Account Number: [id number] Date of Birth/Sex: Treating RN: May 23, 1966 (56 y.o. Jane [name], Jane [name] Primary Care Provider: [name] Other Clinician: Referring Provider: Treating Provider/Extender: Orson Gear in Treatment: 14 Debridement Performed for Assessment: Wound #2 Right Calcaneus Performed By: Physician Fredirick Maudlin, MD Debridement Type: Debridement Level of Consciousness (Pre-procedure): Awake and Alert Pre-procedure Verification/Time Out Yes - 11:20 Taken: Start Time: 11:21 Pain Control: Lidocaine 5% topical ointment T Area Debrided (L x W): otal 1 (cm) x 1 (cm) = 1 (cm) Tissue and other material debrided: Viable, Non-Viable, Callus, Slough, Subcutaneous, Skin: Dermis , Skin: Epidermis, Fibrin/Exudate, Slough Level: Skin/Subcutaneous Tissue Debridement Description: Excisional Instrument: Curette Bleeding: Moderate Hemostasis Achieved: Silver Nitrate End Time: 11:25 Procedural Pain: 0 Post Procedural Pain: 0 Response to Treatment: Procedure was tolerated well Level  of Consciousness (Post- Awake and Alert procedure): Post Debridement Measurements of Total Wound Length: (cm) 0.8 Stage: Unstageable/Unclassified Width: (cm) 0.8 Depth: (cm) 0.6 Volume: (cm) 0.302 Character of Wound/Ulcer Post Debridement: Improved Post Procedure Diagnosis Same as Pre-procedure Electronic Signature(s) Signed: 12/29/2021 5:31:26 PM By: Deon Pilling RN, BSN Signed: 12/30/2021 8:31:39 AM By: Fredirick Maudlin MD FACS Entered By: Deon Pilling on 12/29/2021 11:26:59 -------------------------------------------------------------------------------- HPI Details Patient Name: Date of Service: Jane [name] Nurse M. 12/29/2021 10:30 A M Medical Record Number: [id number] Patient Account Number: [id number] Date of Birth/Sex: Treating RN: 09/03/1966 (56 y.o. F) Primary Care Provider: [name] Other Clinician: Referring Provider: Treating Provider/Extender: Orson Gear in Treatment: 14 History of Present Illness HPI Description: Admission 09/19/2021 Ms. Jane [name] is a 56 year old female with a past medical history of schizoaffective disorder, end-stage renal disease on peritoneal dialysis and seizure disorder that presents to the clinic for a right heel wound that has been present since her hospitalization in April 2022 because of an epidural abscess that required surgery. During her hospitalization she developed the heel wound and a breast wound. She would lean more aggressively to the left side for long periods of time. The breast wound has healed. The right heel Has developed a loose callus. She is not mobile and is either in her wheelchair or in the bed most of the day. She currently denies pain, increased warmth or erythema to the area or drainage. Son-in-law is present and helps with the history. Patient is unable to participate fully in her care due to her diagnosis of schizoaffective disorder. 10/03/2021; patient presents for  follow-up. She has developed a new pressure injury to the calcaneus and has skin breakdown to the surrounding area. She has been using Hydrofera Blue to the previous wound site. She has no issues or complaints today. She denies signs of infection. 12/19; patient presents for follow-up. She has been using Hydrofera Blue to the wound sites. She went back and obtained TBI's. She currently denies signs of infection. 1/3; patient presents for  follow-up. She has been using Hydrofera Blue to the medial wound and Santyl to the calcaneus wound. She has no issues or complaints today. She denies signs of infection. 1/17; patient presents for follow-up. She has been using Hydrofera Blue to the medial wound along with Santyl. She been using Santyl to the calcaneus wound. She has no issues or complaints today. She denies signs of infection. 1/31; patient presents for follow-up. The medial wound remains closed. She has been using Santyl to the calcaneus wound. She has no issues or complaints today. She denies signs of infection. 2/7; patient presents for follow-up. She has been using Santyl to the calcaneus wound. The medial wound remains closed but does have callus surrounding this. She is wearing her Prevalon boots however takes these off pretty regularly. She currently denies signs of infection. 2/21; patient presents for follow-up. She continues to use Santyl to the calcaneus wound. She is unable to offload the right heel. Son states that the patient is often taking off the Prevalon boots and putting pressure on the heel. She currently denies signs of infection. 2/28; patient presents for follow-up. She continues to use Santyl to the wound bed. She reports taking Augmentin with no issues. She denies systemic signs of infection. It is unclear if she can truly offload this wound. Her CT scan has not been scheduled yet. 12/29/2021: CT scan was performed on March 7. The radiologist interpreted this as follows: "There  is thinning of the posterolateral heel soft tissues coming within 4 mm of the posterolateral calcaneus. There was no definitive cortical erosion to suggest acute osteomyelitis." Her course of Augmentin had been extended, but apparently there was some confusion at the pharmacy and they did not dispense this. The intake nurse contacted the pharmacy and they will provide the prescription, but this means she has not had any antibiotics since Saturday. She continues to be in [place] and silver alginate. Her son is with her today and reports that she does have Prevalon boots, but that she often slides her foot around inside the boot. Electronic Signature(s) Signed: 12/29/2021 12:31:54 PM By: Fredirick Maudlin MD FACS Entered By: Fredirick Maudlin on 12/29/2021 12:31:54 -------------------------------------------------------------------------------- Physical Exam Details Patient Name: Date of Service: Jane [name] Nurse M. 12/29/2021 10:30 A M Medical Record Number: [id number] Patient Account Number: [id number] Date of Birth/Sex: Treating RN: 02/01/1966 (56 y.o. F) Primary Care Provider: [name] Other Clinician: Referring Provider: Treating Provider/Extender: Orson Gear in Treatment: 14 Constitutional .Tachycardic, asymptomatic.. . . No acute distress. Respiratory Normal work of breathing on room air. Notes 12/29/2021: On the right heel, an open wound persists. There is callus overlying macerated tissue. The wound itself is very close to bone, but no bone is exposed. Electronic Signature(s) Signed: 12/29/2021 12:35:13 PM By: Fredirick Maudlin MD FACS Entered By: Fredirick Maudlin on 12/29/2021 12:35:12 -------------------------------------------------------------------------------- Physician Orders Details Patient Name: Date of Service: Jane [name] Nurse M. 12/29/2021 10:30 A M Medical Record Number: [id number] Patient Account Number: [id number] Date of  Birth/Sex: Treating RN: 10/16/1966 (56 y.o. Jane [name], Jane [name] Primary Care Provider: [name] Other Clinician: Referring Provider: Treating Provider/Extender: Orson Gear in Treatment: 14 Verbal / Phone Orders: No Diagnosis Coding ICD-10 Coding Code Description L89.613 Pressure ulcer of right heel, stage 3 F25.9 Schizoaffective disorder, unspecified N18.6 End stage renal disease L89.610 Pressure ulcer of right heel, unstageable Follow-up Appointments ppointment in 2 weeks. - Dr. Heber [place] and Jane Klippel, RN Room 8 Monday or Thursday next week. Return  A Other: - ***Go pick up the oral antibiotics Augmentin at your pharmacy that was prescribed on 12/20/2021. Bathing/ Shower/ Hygiene May shower and wash wound with soap and water. - when changing dressing Edema Control - Lymphedema / SCD / Other Elevate legs to the level of the heart or above for 30 minutes daily and/or when sitting, a frequency of: - 3-4 times a day throughout the day. Off-Loading Other: - Prevalon Boot to right foot, avoid direct pressure to back of heels. Additional Orders / Instructions Follow Nutritious Diet Wound Treatment Wound #2 - Calcaneus Wound Laterality: Right Cleanser: Soap and Water 1 x Per Day/30 Days Discharge Instructions: May shower and wash wound with dial antibacterial soap and water prior to dressing change. Peri-Wound Care: Zinc Oxide Ointment 30g tube 1 x Per Day/30 Days Discharge Instructions: Apply Zinc Oxide around the wound bed with each dressing change Peri-Wound Care: Sween Lotion (Moisturizing lotion) 1 x Per Day/30 Days Discharge Instructions: Apply moisturizing lotion as directed Prim Dressing: KerraCel Ag Gelling Fiber Dressing, 2x2 in (silver alginate) 1 x Per Day/30 Days ary Discharge Instructions: Apply silver alginate over the santyl into undermining and wound bed. Prim Dressing: Santyl Ointment 1 x Per Day/30 Days ary Discharge Instructions:  Apply nickel thick amount to wound bed as instructed Secondary Dressing: Woven Gauze Sponge, Non-Sterile 4x4 in 1 x Per Day/30 Days Discharge Instructions: Cover with gauze. Secondary Dressing: ABD Pad, 8x10 1 x Per Day/30 Days Discharge Instructions: Apply abd pad over the gauze. Secured With: [employer], 4.5x3.1 (in/yd) 1 x Per Day/30 Days Discharge Instructions: Secure with Kerlix as directed. Secured With: Paper Tape, 2x10 (in/yd) 1 x Per Day/30 Days Discharge Instructions: Secure dressing with tape as directed. Electronic Signature(s) Signed: 12/30/2021 8:31:39 AM By: Fredirick Maudlin MD FACS Entered By: Fredirick Maudlin on 12/29/2021 12:35:34 -------------------------------------------------------------------------------- Problem List Details Patient Name: Date of Service: Jane [name] Nurse M. 12/29/2021 10:30 A M Medical Record Number: [id number] Patient Account Number: [id number] Date of Birth/Sex: Treating RN: 1966-02-08 (56 y.o. Jane [name], Jane [name] Primary Care Provider: [name] Other Clinician: Referring Provider: Treating Provider/Extender: Orson Gear in Treatment: 14 Active Problems ICD-10 Encounter Code Description Active Date MDM Diagnosis L89.613 Pressure ulcer of right heel, stage 3 09/19/2021 No Yes F25.9 Schizoaffective disorder, unspecified 09/19/2021 No Yes N18.6 End stage renal disease 09/19/2021 No Yes L89.610 Pressure ulcer of right heel, unstageable 10/03/2021 No Yes Inactive Problems Resolved Problems Electronic Signature(s) Signed: 12/29/2021 12:28:54 PM By: Fredirick Maudlin MD FACS Entered By: Fredirick Maudlin on 12/29/2021 12:28:54 -------------------------------------------------------------------------------- Progress Note Details Patient Name: Date of Service: Jane [name] Nurse M. 12/29/2021 10:30 A M Medical Record Number: [id number] Patient Account Number: [id number] Date of Birth/Sex: Treating  RN: 03-27-1966 (56 y.o. F) Primary Care Provider: [name] Other Clinician: Referring Provider: Treating Provider/Extender: Orson Gear in Treatment: 14 Subjective Chief Complaint Information obtained from Patient Right heel wound History of Present Illness (HPI) Admission 09/19/2021 Ms. Jane [name] Kardell is a 56 year old female with a past medical history of schizoaffective disorder, end-stage renal disease on peritoneal dialysis and seizure disorder that presents to the clinic for a right heel wound that has been present since her hospitalization in April 2022 because of an epidural abscess that required surgery. During her hospitalization she developed the heel wound and a breast wound. She would lean more aggressively to the left side for long periods of time. The breast wound has healed. The right heel Has developed a loose  callus. She is not mobile and is either in her wheelchair or in the bed most of the day. She currently denies pain, increased warmth or erythema to the area or drainage. Son-in-law is present and helps with the history. Patient is unable to participate fully in her care due to her diagnosis of schizoaffective disorder. 10/03/2021; patient presents for follow-up. She has developed a new pressure injury to the calcaneus and has skin breakdown to the surrounding area. She has been using Hydrofera Blue to the previous wound site. She has no issues or complaints today. She denies signs of infection. 12/19; patient presents for follow-up. She has been using Hydrofera Blue to the wound sites. She went back and obtained TBI's. She currently denies signs of infection. 1/3; patient presents for follow-up. She has been using Hydrofera Blue to the medial wound and Santyl to the calcaneus wound. She has no issues or complaints today. She denies signs of infection. 1/17; patient presents for follow-up. She has been using Hydrofera Blue to the  medial wound along with Santyl. She been using Santyl to the calcaneus wound. She has no issues or complaints today. She denies signs of infection. 1/31; patient presents for follow-up. The medial wound remains closed. She has been using Santyl to the calcaneus wound. She has no issues or complaints today. She denies signs of infection. 2/7; patient presents for follow-up. She has been using Santyl to the calcaneus wound. The medial wound remains closed but does have callus surrounding this. She is wearing her Prevalon boots however takes these off pretty regularly. She currently denies signs of infection. 2/21; patient presents for follow-up. She continues to use Santyl to the calcaneus wound. She is unable to offload the right heel. Son states that the patient is often taking off the Prevalon boots and putting pressure on the heel. She currently denies signs of infection. 2/28; patient presents for follow-up. She continues to use Santyl to the wound bed. She reports taking Augmentin with no issues. She denies systemic signs of infection. It is unclear if she can truly offload this wound. Her CT scan has not been scheduled yet. 12/29/2021: CT scan was performed on March 7. The radiologist interpreted this as follows: "There is thinning of the posterolateral heel soft tissues coming within 4 mm of the posterolateral calcaneus. There was no definitive cortical erosion to suggest acute osteomyelitis." Her course of Augmentin had been extended, but apparently there was some confusion at the pharmacy and they did not dispense this. The intake nurse contacted the pharmacy and they will provide the prescription, but this means she has not had any antibiotics since Saturday. She continues to be in [place] and silver alginate. Her son is with her today and reports that she does have Prevalon boots, but that she often slides her foot around inside the boot. Patient History Information obtained from Patient,  Caregiver. Family History Cancer - Mother, Kidney Disease - Maternal Grandparents, No family history of Diabetes, Heart Disease, Hereditary Spherocytosis, Hypertension, Lung Disease, Seizures, Stroke, Thyroid Problems, Tuberculosis. Social History Never smoker, Marital Status - Single, Alcohol Use - Never, Drug Use - No History, Caffeine Use - Rarely. Medical History Eyes Patient has history of Cataracts Hematologic/Lymphatic Patient has history of Anemia Cardiovascular Patient has history of Congestive Heart Failure, Hypotension Denies history of Hypertension Genitourinary Patient has history of End Stage Renal Disease - Dialysis Neurologic Patient has history of Seizure Disorder - last seizure several years ago Medical A Surgical History Notes nd Endocrine  Hypothyroidism Musculoskeletal Laminectomy 05/2021 Psychiatric Schizoaffective Disorder Objective Constitutional Tachycardic, asymptomatic.Marland Kitchen No acute distress. Vitals Time Taken: 11:00 AM, Height: 65 in, Weight: 130 lbs, BMI: 21.6, Temperature: 97.5 F, Pulse: 118 bpm, Respiratory Rate: 20 breaths/min, Blood Pressure: 130/86 mmHg. Respiratory Normal work of breathing on room air. General Notes: 12/29/2021: On the right heel, an open wound persists. There is callus overlying macerated tissue. The wound itself is very close to bone, but no bone is exposed. Integumentary (Hair, Skin) Wound #2 status is Open. Original cause of wound was Pressure Injury. The date acquired was: 10/03/2021. The wound has been in treatment 12 weeks. The wound is located on the Right Calcaneus. The wound measures 0.8cm length x 0.8cm width x 0.6cm depth; 0.503cm^2 area and 0.302cm^3 volume. There is Fat Layer (Subcutaneous Tissue) exposed. There is no tunneling noted, however, there is undermining starting at 5:00 and ending at 7:00 with a maximum distance of 0.6cm. There is a medium amount of serosanguineous drainage noted. The wound margin is  distinct with the outline attached to the wound base. There is small (1-33%) red granulation within the wound bed. There is a large (67-100%) amount of necrotic tissue within the wound bed including Adherent Slough. Assessment Active Problems ICD-10 Pressure ulcer of right heel, stage 3 Schizoaffective disorder, unspecified End stage renal disease Pressure ulcer of right heel, unstageable Procedures Wound #2 Pre-procedure diagnosis of Wound #2 is a Pressure Ulcer located on the Right Calcaneus . There was a Excisional Skin/Subcutaneous Tissue Debridement with a total area of 1 sq cm performed by Fredirick Maudlin, MD. With the following instrument(s): Curette to remove Viable and Non-Viable tissue/material. Material removed includes Callus, Subcutaneous Tissue, Slough, Skin: Dermis, Skin: Epidermis, and Fibrin/Exudate after achieving pain control using Lidocaine 5% topical ointment. A time out was conducted at 11:20, prior to the start of the procedure. A Moderate amount of bleeding was controlled with Silver Nitrate. The procedure was tolerated well with a pain level of 0 throughout and a pain level of 0 following the procedure. Post Debridement Measurements: 0.8cm length x 0.8cm width x 0.6cm depth; 0.302cm^3 volume. Post debridement Stage noted as Unstageable/Unclassified. Character of Wound/Ulcer Post Debridement is improved. Post procedure Diagnosis Wound #2: Same as Pre-Procedure Plan Follow-up Appointments: Return Appointment in 2 weeks. - Dr. Heber [place] and Jane Klippel, RN Room 8 Monday or Thursday next week. Other: - ***Go pick up the oral antibiotics Augmentin at your pharmacy that was prescribed on 12/20/2021. Bathing/ Shower/ Hygiene: May shower and wash wound with soap and water. - when changing dressing Edema Control - Lymphedema / SCD / Other: Elevate legs to the level of the heart or above for 30 minutes daily and/or when sitting, a frequency of: - 3-4 times a day throughout the  day. Off-Loading: Other: - Prevalon Boot to right foot, avoid direct pressure to back of heels. Additional Orders / Instructions: Follow Nutritious Diet WOUND #2: - Calcaneus Wound Laterality: Right Cleanser: Soap and Water 1 x Per Day/30 Days Discharge Instructions: May shower and wash wound with dial antibacterial soap and water prior to dressing change. Peri-Wound Care: Zinc Oxide Ointment 30g tube 1 x Per Day/30 Days Discharge Instructions: Apply Zinc Oxide around the wound bed with each dressing change Peri-Wound Care: Sween Lotion (Moisturizing lotion) 1 x Per Day/30 Days Discharge Instructions: Apply moisturizing lotion as directed Prim Dressing: KerraCel Ag Gelling Fiber Dressing, 2x2 in (silver alginate) 1 x Per Day/30 Days ary Discharge Instructions: Apply silver alginate over the santyl into undermining and  wound bed. Prim Dressing: Santyl Ointment 1 x Per Day/30 Days ary Discharge Instructions: Apply nickel thick amount to wound bed as instructed Secondary Dressing: Woven Gauze Sponge, Non-Sterile 4x4 in 1 x Per Day/30 Days Discharge Instructions: Cover with gauze. Secondary Dressing: ABD Pad, 8x10 1 x Per Day/30 Days Discharge Instructions: Apply abd pad over the gauze. Secured With: [employer], 4.5x3.1 (in/yd) 1 x Per Day/30 Days Discharge Instructions: Secure with Kerlix as directed. Secured With: Paper T ape, 2x10 (in/yd) 1 x Per Day/30 Days Discharge Instructions: Secure dressing with tape as directed. 12/29/2021: Open wound on the right calcaneus. Some callus and nonviable tissue was debrided today. She did not get the extended course of Augmentin, but her son will pick that up today. CT scan was negative for evidence of osteomyelitis. Continue current dressing with Santyl and alginate. Reminded of the importance of wearing Prevalon boot and offloading the heels is much as possible. Electronic Signature(s) Signed: 12/29/2021 12:36:57 PM By: Fredirick Maudlin MD  FACS Entered By: Fredirick Maudlin on 12/29/2021 12:36:57 -------------------------------------------------------------------------------- HxROS Details Patient Name: Date of Service: Jane [name] Nurse M. 12/29/2021 10:30 A M Medical Record Number: [id number] Patient Account Number: [id number] Date of Birth/Sex: Treating RN: 18-Oct-1966 (55 y.o. F) Primary Care Provider: [name] Other Clinician: Referring Provider: Treating Provider/Extender: Orson Gear in Treatment: 14 Information Obtained From Patient Caregiver Eyes Medical History: Positive for: Cataracts Hematologic/Lymphatic Medical History: Positive for: Anemia Cardiovascular Medical History: Positive for: Congestive Heart Failure; Hypotension Negative for: Hypertension Endocrine Medical History: Past Medical History Notes: Hypothyroidism Genitourinary Medical History: Positive for: End Stage Renal Disease - Dialysis Musculoskeletal Medical History: Past Medical History Notes: Laminectomy 05/2021 Neurologic Medical History: Positive for: Seizure Disorder - last seizure several years ago Psychiatric Medical History: Past Medical History Notes: Schizoaffective Disorder HBO Extended History Items Eyes: Cataracts Immunizations Pneumococcal Vaccine: Received Pneumococcal Vaccination: No Implantable Devices Yes Family and Social History Cancer: Yes - Mother; Diabetes: No; Heart Disease: No; Hereditary Spherocytosis: No; Hypertension: No; Kidney Disease: Yes - Maternal Grandparents; Lung Disease: No; Seizures: No; Stroke: No; Thyroid Problems: No; Tuberculosis: No; Never smoker; Marital Status - Single; Alcohol Use: Never; Drug Use: No History; Caffeine Use: Rarely; Financial Concerns: No; Food, Clothing or Shelter Needs: No; Support System Lacking: No; Transportation Concerns: No Electronic Signature(s) Signed: 12/30/2021 8:31:39 AM By: Fredirick Maudlin MD FACS Entered  By: Fredirick Maudlin on 12/29/2021 12:32:02 -------------------------------------------------------------------------------- SuperBill Details Patient Name: Date of Service: Jane [name] Nurse M. 12/29/2021 Medical Record Number: [id number] Patient Account Number: [id number] Date of Birth/Sex: Treating RN: Aug 21, 1966 (56 y.o. [name] Primary Care Provider: [name] Other Clinician: Referring Provider: Treating Provider/Extender: Orson Gear in Treatment: 14 Diagnosis Coding ICD-10 Codes Code Description [phone] Pressure ulcer of right heel, stage 3 F25.9 Schizoaffective disorder, unspecified N18.6 End stage renal disease L89.610 Pressure ulcer of right heel, unstageable Facility Procedures CPT4 Code: [id number] Description: 03474 - DEB SUBQ TISSUE 20 SQ CM/< ICD-10 Diagnosis Description L89.610 Pressure ulcer of right heel, unstageable Modifier: Quantity: 1 Physician Procedures : CPT4 Code Description Modifier [id number] - WC PHYS SUBQ TISS 20 SQ CM ICD-10 Diagnosis Description L89.610 Pressure ulcer of right heel, unstageable Quantity: 1 Electronic Signature(s) Signed: 12/29/2021 12:37:05 PM By: Fredirick Maudlin MD FACS Entered By: Fredirick Maudlin on 12/29/2021 12:37:05

## 2021-12-31 DIAGNOSIS — D631 Anemia in chronic kidney disease: Secondary | ICD-10-CM | POA: Diagnosis not present

## 2021-12-31 DIAGNOSIS — N186 End stage renal disease: Secondary | ICD-10-CM | POA: Diagnosis not present

## 2021-12-31 DIAGNOSIS — N2581 Secondary hyperparathyroidism of renal origin: Secondary | ICD-10-CM | POA: Diagnosis not present

## 2022-01-01 DIAGNOSIS — N186 End stage renal disease: Secondary | ICD-10-CM | POA: Diagnosis not present

## 2022-01-01 DIAGNOSIS — D631 Anemia in chronic kidney disease: Secondary | ICD-10-CM | POA: Diagnosis not present

## 2022-01-01 DIAGNOSIS — N2581 Secondary hyperparathyroidism of renal origin: Secondary | ICD-10-CM | POA: Diagnosis not present

## 2022-01-02 DIAGNOSIS — L89613 Pressure ulcer of right heel, stage 3: Secondary | ICD-10-CM | POA: Diagnosis not present

## 2022-01-02 DIAGNOSIS — D631 Anemia in chronic kidney disease: Secondary | ICD-10-CM | POA: Diagnosis not present

## 2022-01-02 DIAGNOSIS — I5020 Unspecified systolic (congestive) heart failure: Secondary | ICD-10-CM | POA: Diagnosis not present

## 2022-01-02 DIAGNOSIS — I959 Hypotension, unspecified: Secondary | ICD-10-CM | POA: Diagnosis not present

## 2022-01-02 DIAGNOSIS — R627 Adult failure to thrive: Secondary | ICD-10-CM | POA: Diagnosis not present

## 2022-01-02 DIAGNOSIS — N186 End stage renal disease: Secondary | ICD-10-CM | POA: Diagnosis not present

## 2022-01-02 DIAGNOSIS — N2581 Secondary hyperparathyroidism of renal origin: Secondary | ICD-10-CM | POA: Diagnosis not present

## 2022-01-03 DIAGNOSIS — I959 Hypotension, unspecified: Secondary | ICD-10-CM | POA: Diagnosis not present

## 2022-01-03 DIAGNOSIS — D631 Anemia in chronic kidney disease: Secondary | ICD-10-CM | POA: Diagnosis not present

## 2022-01-03 DIAGNOSIS — N2581 Secondary hyperparathyroidism of renal origin: Secondary | ICD-10-CM | POA: Diagnosis not present

## 2022-01-03 DIAGNOSIS — N186 End stage renal disease: Secondary | ICD-10-CM | POA: Diagnosis not present

## 2022-01-03 DIAGNOSIS — I5020 Unspecified systolic (congestive) heart failure: Secondary | ICD-10-CM | POA: Diagnosis not present

## 2022-01-03 DIAGNOSIS — R627 Adult failure to thrive: Secondary | ICD-10-CM | POA: Diagnosis not present

## 2022-01-03 DIAGNOSIS — L89613 Pressure ulcer of right heel, stage 3: Secondary | ICD-10-CM | POA: Diagnosis not present

## 2022-01-04 DIAGNOSIS — N186 End stage renal disease: Secondary | ICD-10-CM | POA: Diagnosis not present

## 2022-01-04 DIAGNOSIS — D631 Anemia in chronic kidney disease: Secondary | ICD-10-CM | POA: Diagnosis not present

## 2022-01-04 DIAGNOSIS — N2581 Secondary hyperparathyroidism of renal origin: Secondary | ICD-10-CM | POA: Diagnosis not present

## 2022-01-05 DIAGNOSIS — N2581 Secondary hyperparathyroidism of renal origin: Secondary | ICD-10-CM | POA: Diagnosis not present

## 2022-01-05 DIAGNOSIS — N186 End stage renal disease: Secondary | ICD-10-CM | POA: Diagnosis not present

## 2022-01-05 DIAGNOSIS — D631 Anemia in chronic kidney disease: Secondary | ICD-10-CM | POA: Diagnosis not present

## 2022-01-06 DIAGNOSIS — N186 End stage renal disease: Secondary | ICD-10-CM | POA: Diagnosis not present

## 2022-01-06 DIAGNOSIS — D631 Anemia in chronic kidney disease: Secondary | ICD-10-CM | POA: Diagnosis not present

## 2022-01-06 DIAGNOSIS — N2581 Secondary hyperparathyroidism of renal origin: Secondary | ICD-10-CM | POA: Diagnosis not present

## 2022-01-07 DIAGNOSIS — N2581 Secondary hyperparathyroidism of renal origin: Secondary | ICD-10-CM | POA: Diagnosis not present

## 2022-01-07 DIAGNOSIS — N186 End stage renal disease: Secondary | ICD-10-CM | POA: Diagnosis not present

## 2022-01-07 DIAGNOSIS — D631 Anemia in chronic kidney disease: Secondary | ICD-10-CM | POA: Diagnosis not present

## 2022-01-08 DIAGNOSIS — N186 End stage renal disease: Secondary | ICD-10-CM | POA: Diagnosis not present

## 2022-01-08 DIAGNOSIS — D631 Anemia in chronic kidney disease: Secondary | ICD-10-CM | POA: Diagnosis not present

## 2022-01-08 DIAGNOSIS — N2581 Secondary hyperparathyroidism of renal origin: Secondary | ICD-10-CM | POA: Diagnosis not present

## 2022-01-09 DIAGNOSIS — N186 End stage renal disease: Secondary | ICD-10-CM | POA: Diagnosis not present

## 2022-01-09 DIAGNOSIS — N2581 Secondary hyperparathyroidism of renal origin: Secondary | ICD-10-CM | POA: Diagnosis not present

## 2022-01-09 DIAGNOSIS — D631 Anemia in chronic kidney disease: Secondary | ICD-10-CM | POA: Diagnosis not present

## 2022-01-10 DIAGNOSIS — N186 End stage renal disease: Secondary | ICD-10-CM | POA: Diagnosis not present

## 2022-01-10 DIAGNOSIS — D631 Anemia in chronic kidney disease: Secondary | ICD-10-CM | POA: Diagnosis not present

## 2022-01-10 DIAGNOSIS — N2581 Secondary hyperparathyroidism of renal origin: Secondary | ICD-10-CM | POA: Diagnosis not present

## 2022-01-10 DIAGNOSIS — L89613 Pressure ulcer of right heel, stage 3: Secondary | ICD-10-CM | POA: Diagnosis not present

## 2022-01-10 DIAGNOSIS — I5020 Unspecified systolic (congestive) heart failure: Secondary | ICD-10-CM | POA: Diagnosis not present

## 2022-01-10 DIAGNOSIS — R627 Adult failure to thrive: Secondary | ICD-10-CM | POA: Diagnosis not present

## 2022-01-10 DIAGNOSIS — I959 Hypotension, unspecified: Secondary | ICD-10-CM | POA: Diagnosis not present

## 2022-01-11 DIAGNOSIS — N2581 Secondary hyperparathyroidism of renal origin: Secondary | ICD-10-CM | POA: Diagnosis not present

## 2022-01-11 DIAGNOSIS — D631 Anemia in chronic kidney disease: Secondary | ICD-10-CM | POA: Diagnosis not present

## 2022-01-11 DIAGNOSIS — N186 End stage renal disease: Secondary | ICD-10-CM | POA: Diagnosis not present

## 2022-01-12 ENCOUNTER — Other Ambulatory Visit: Payer: Self-pay

## 2022-01-12 ENCOUNTER — Encounter (HOSPITAL_BASED_OUTPATIENT_CLINIC_OR_DEPARTMENT_OTHER): Payer: Medicare Other | Admitting: Internal Medicine

## 2022-01-12 DIAGNOSIS — F259 Schizoaffective disorder, unspecified: Secondary | ICD-10-CM

## 2022-01-12 DIAGNOSIS — N186 End stage renal disease: Secondary | ICD-10-CM

## 2022-01-12 DIAGNOSIS — I700 Atherosclerosis of aorta: Secondary | ICD-10-CM | POA: Diagnosis not present

## 2022-01-12 DIAGNOSIS — Z9181 History of falling: Secondary | ICD-10-CM | POA: Diagnosis not present

## 2022-01-12 DIAGNOSIS — N2581 Secondary hyperparathyroidism of renal origin: Secondary | ICD-10-CM | POA: Diagnosis not present

## 2022-01-12 DIAGNOSIS — Z993 Dependence on wheelchair: Secondary | ICD-10-CM | POA: Diagnosis not present

## 2022-01-12 DIAGNOSIS — L89610 Pressure ulcer of right heel, unstageable: Secondary | ICD-10-CM

## 2022-01-12 DIAGNOSIS — I5020 Unspecified systolic (congestive) heart failure: Secondary | ICD-10-CM | POA: Diagnosis not present

## 2022-01-12 DIAGNOSIS — L89613 Pressure ulcer of right heel, stage 3: Secondary | ICD-10-CM | POA: Diagnosis not present

## 2022-01-12 DIAGNOSIS — R627 Adult failure to thrive: Secondary | ICD-10-CM | POA: Diagnosis not present

## 2022-01-12 DIAGNOSIS — D631 Anemia in chronic kidney disease: Secondary | ICD-10-CM | POA: Diagnosis not present

## 2022-01-12 DIAGNOSIS — Z8701 Personal history of pneumonia (recurrent): Secondary | ICD-10-CM | POA: Diagnosis not present

## 2022-01-12 DIAGNOSIS — Z992 Dependence on renal dialysis: Secondary | ICD-10-CM | POA: Diagnosis not present

## 2022-01-12 DIAGNOSIS — E440 Moderate protein-calorie malnutrition: Secondary | ICD-10-CM | POA: Diagnosis not present

## 2022-01-12 DIAGNOSIS — K8010 Calculus of gallbladder with chronic cholecystitis without obstruction: Secondary | ICD-10-CM | POA: Diagnosis not present

## 2022-01-12 DIAGNOSIS — I959 Hypotension, unspecified: Secondary | ICD-10-CM | POA: Diagnosis not present

## 2022-01-12 DIAGNOSIS — R188 Other ascites: Secondary | ICD-10-CM | POA: Diagnosis not present

## 2022-01-12 DIAGNOSIS — G40909 Epilepsy, unspecified, not intractable, without status epilepticus: Secondary | ICD-10-CM | POA: Diagnosis not present

## 2022-01-12 DIAGNOSIS — I509 Heart failure, unspecified: Secondary | ICD-10-CM | POA: Diagnosis not present

## 2022-01-12 NOTE — Progress Notes (Signed)
KENIJAH, BENNINGFIELD ([id number]) ?Visit Report for 01/12/2022 ?Chief Complaint Document Details ?Patient Name: Date of Service: ?[name], Silvina M. 01/12/2022 10:30 A M ?Medical Record Number: [id number] ?Patient Account Number: [id number] ?Date of Birth/Sex: Treating RN: ?[birth year] (56 y.o. F) [name], [name] ?Primary Care Provider: [name] Other Clinician: ?Referring Provider: ?Treating Provider/Extender: [name], [name] ?[name]: 16 ?Information Obtained from: Patient ?Chief Complaint ?Right heel wound ?Electronic Signature(s) ?Signed: 01/12/2022 12:13:50 PM By: [name] DO ?Entered By: [name] on 01/12/2022 12:05:09 ?-------------------------------------------------------------------------------- ?HPI Details ?Patient Name: Date of Service: ?[name], Lorenia M. 01/12/2022 10:30 A M ?Medical Record Number: [id number] ?Patient Account Number: [id number] ?Date of Birth/Sex: Treating RN: ?17-Jul-1966 (56 y.o. F) [name], [name] ?Primary Care Provider: [name] Other Clinician: ?Referring Provider: ?Treating Provider/Extender: [name], [name] ?[name]: 16 ?History of Present Illness ?HPI Description: Admission 09/19/2021 ?Ms. Gisela Lea is a 56 year old female with a past medical history of schizoaffective disorder, end-stage renal disease on peritoneal dialysis and seizure ?disorder that presents to the clinic for a right heel wound that has been present since her hospitalization in April 2022 because of an epidural abscess that ?required surgery. During her hospitalization she developed the heel wound and a breast wound. She would lean more aggressively to the left side for long ?periods of time. The breast wound has healed. The right heel Has developed a loose callus. She is not mobile and is either in her wheelchair or in the bed most ?of the day. She currently denies pain, increased warmth or erythema to the area or  drainage. Son-in-law is present and helps with the history. Patient is unable ?to participate fully in her care due to her diagnosis of schizoaffective disorder. ?10/03/2021; patient presents for follow-up. She has developed a new pressure injury to the calcaneus and has skin breakdown to the surrounding area. She has ?been using Hydrofera Blue to the previous wound site. She has no issues or complaints today. She denies signs of infection. ?12/19; patient presents for follow-up. She has been using Hydrofera Blue to the wound sites. She went back and obtained TBI's. She currently denies signs of ?infection. ?1/3; patient presents for follow-up. She has been using Hydrofera Blue to the medial wound and Santyl to the calcaneus wound. She has no issues or ?complaints today. She denies signs of infection. ?1/17; patient presents for follow-up. She has been using Hydrofera Blue to the medial wound along with Santyl. She been using Santyl to the calcaneus wound. ?She has no issues or complaints today. She denies signs of infection. ?1/31; patient presents for follow-up. The medial wound remains closed. She has been using Santyl to the calcaneus wound. She has no issues or complaints ?today. She denies signs of infection. ?2/7; patient presents for follow-up. She has been using Santyl to the calcaneus wound. The medial wound remains closed but does have callus surrounding this. ?She is wearing her Prevalon boots however takes these off pretty regularly. She currently denies signs of infection. ?2/21; patient presents for follow-up. She continues to use Santyl to the calcaneus wound. She is unable to offload the right heel. Son states that the patient is ?often taking off the Prevalon boots and putting pressure on the heel. She currently denies signs of infection. ?2/28; patient presents for follow-up. She continues to use Santyl to the wound bed. She reports taking Augmentin with no issues. She denies systemic signs  of ?infection. It is unclear if she can truly offload  this wound. Her CT scan has not been scheduled yet. ?12/29/2021: CT scan was performed on March 7. The radiologist interpreted this as follows: "There is thinning of the posterolateral heel soft tissues coming within ?4 mm of the posterolateral calcaneus. There was no definitive cortical erosion to suggest acute osteomyelitis." Her course of Augmentin had been extended, ?but apparently there was some confusion at the pharmacy and they did not dispense this. The intake nurse contacted the pharmacy and they will provide the ?prescription, but this means she has not had any antibiotics since Saturday. She continues to be in [place] and silver alginate. Her son is with her today and ?reports that she does have Prevalon boots, but that she often slides her foot around inside the boot. ?3/23; patient presents for follow-up. She completed her course of Augmentin. She reports improvement in wound healing. She currently denies systemic signs ?of infection. ?Electronic Signature(s) ?Signed: 01/12/2022 12:13:50 PM By: [name] DO ?Entered By: [name] on 01/12/2022 12:06:00 ?-------------------------------------------------------------------------------- ?Physical Exam Details ?Patient Name: Date of Service: ?[name], Ahnika M. 01/12/2022 10:30 A M ?Medical Record Number: [id number] ?Patient Account Number: [id number] ?Date of Birth/Sex: Treating RN: ?1966/04/18 (56 y.o. F) [name], [name] ?Primary Care Provider: [name] Other Clinician: ?Referring Provider: ?Treating Provider/Extender: [name], [name] ?[name]: 16 ?Constitutional ?respirations regular, non-labored and within target range for patient.Marland Kitchen ?Cardiovascular ?2+ dorsalis pedis/posterior tibialis pulses. ?Psychiatric ?pleasant and cooperative. ?Notes ?Right foot: T the heel there is an open wound with circumferential callus and granulation tissue with nonviable  tissue that is deep but does not probe to bone. ?o ?No maceration noted on exam today. ?Electronic Signature(s) ?Signed: 01/12/2022 12:13:50 PM By: [name] DO ?Entered By: [name] on 01/12/2022 12:06:49 ?-------------------------------------------------------------------------------- ?Physician Orders Details ?Patient Name: Date of Service: ?[name], Dayanna M. 01/12/2022 10:30 A M ?Medical Record Number: [id number] ?Patient Account Number: [id number] ?Date of Birth/Sex: Treating RN: ?May 02, 1966 (56 y.o. F) [name], [name] ?Primary Care Provider: [name] Other Clinician: ?Referring Provider: ?Treating Provider/Extender: [name], [name] ?[name]: 16 ?Verbal / Phone Orders: No ?Diagnosis Coding ?ICD-10 Coding ?Code Description ?L89.613 Pressure ulcer of right heel, stage 3 ?F25.9 Schizoaffective disorder, unspecified ?N18.6 End stage renal disease ?L89.610 Pressure ulcer of right heel, unstageable ?Follow-up Appointments ?ppointment in 1 week. - Dr. Heber Anna and [place], RN Room 8 Tuesday 0815 01/17/2022 ?Return A ?Bathing/ Shower/ Hygiene ?May shower and wash wound with soap and water. - when changing dressing ?Edema Control - Lymphedema / SCD / Other ?Elevate legs to the level of the heart or above for 30 minutes daily and/or when sitting, a frequency of: - 3-4 times a day throughout the day. ?Off-Loading ?Other: - Prevalon Boot to right foot, avoid direct pressure to back of heels. ?Additional Orders / Instructions ?Follow Nutritious Diet ?Wound Treatment ?Wound #2 - Calcaneus Wound Laterality: Right ?Cleanser: Soap and Water 1 x Per Day/30 Days ?Discharge Instructions: May shower and wash wound with dial antibacterial soap and water prior to dressing change. ?Peri-Wound Care: Zinc Oxide Ointment 30g tube 1 x Per Day/30 Days ?Discharge Instructions: Apply Zinc Oxide around the wound bed with each dressing change ?Peri-Wound Care: Sween Lotion (Moisturizing lotion)  1 x Per Day/30 Days ?Discharge Instructions: Apply moisturizing lotion as directed ?Prim Dressing: KerraCel Ag Gelling Fiber Dressing, 2x2 in (silver alginate) 1 x Per Day/30 Days ?ary ?Discharge Instruction

## 2022-01-12 NOTE — Progress Notes (Signed)
Jane [name], Jane [name] ([id number]) ?Visit Report for 01/12/2022 ?Arrival Information Details ?Patient Name: Date of Service: ?Jane [name], Jane [name]. 01/12/2022 10:30 A [name] ?Medical Record Number: [id number] ?Patient Account Number: [id number] ?Date of Birth/Sex: Treating RN: ?06-10-1966 (56 y.o. F) [name], [name] ?Primary Care Janeen Watson: [name] Other Clinician: ?Referring Miyana Mordecai: ?Treating Francella Barnett/Extender: [name], [name] ?[name]: 16 ?Visit Information History Since Last Visit ?Added or deleted any medications: No ?Patient Arrived: Wheel Chair ?Any new allergies or adverse reactions: No ?Arrival Time: 10:36 ?Had a fall or experienced change in No ?Accompanied By: family member ?activities of daily living that may affect ?Transfer Assistance: Manual ?risk of falls: ?Patient Identification Verified: Yes ?Signs or symptoms of abuse/neglect since last visito No ?Secondary Verification Process Completed: Yes ?Hospitalized since last visit: No ?Patient Requires Transmission-Based Precautions: No ?Implantable device outside of the clinic excluding No ?Patient Has Alerts: Yes ?cellular tissue based products placed in the center ?Patient Alerts: R ABI=Non Comp since last visit: ?Has Dressing in Place as Prescribed: Yes ?Pain Present Now: No ?Electronic Signature(s) ?Signed: 01/12/2022 5:33:59 PM By: Deon Pilling RN, BSN ?Entered By: Deon Pilling on 01/12/2022 10:36:58 ?-------------------------------------------------------------------------------- ?Clinic Level of Care Assessment Details ?Patient Name: Date of Service: ?Jane [name], Jane [name]. 01/12/2022 10:30 A [name] ?Medical Record Number: [id number] ?Patient Account Number: [id number] ?Date of Birth/Sex: Treating RN: ?[birth year] (56 y.o. F) [name], [name] ?Primary Care Ysidro Ramsay: [name] Other Clinician: ?Referring Rawn Quiroa: ?Treating Brody Kump/Extender: [name], [name] ?[name]: 16 ?Clinic Level of Care  Assessment Items ?TOOL 4 Quantity Score ?X- 1 0 ?Use when only an EandM is performed on FOLLOW-UP visit ?ASSESSMENTS - Nursing Assessment / Reassessment ?X- 1 10 ?Reassessment of Co-morbidities (includes updates in patient status) ?X- 1 5 ?Reassessment of Adherence to Treatment Plan ?ASSESSMENTS - Wound and Skin A ssessment / Reassessment ?X - Simple Wound Assessment / Reassessment - one wound 1 5 ?'[]'$  - 0 ?Complex Wound Assessment / Reassessment - multiple wounds ?X- 1 10 ?Dermatologic / Skin Assessment (not related to wound area) ?ASSESSMENTS - Focused Assessment ?X- 1 5 ?Circumferential Edema Measurements - multi extremities ?'[]'$  - 0 ?Nutritional Assessment / Counseling / Intervention ?'[]'$  - 0 ?Lower Extremity Assessment (monofilament, tuning fork, pulses) ?'[]'$  - 0 ?Peripheral Arterial Disease Assessment (using hand held doppler) ?ASSESSMENTS - Ostomy and/or Continence Assessment and Care ?'[]'$  - 0 ?Incontinence Assessment and Management ?'[]'$  - 0 ?Ostomy Care Assessment and Management (repouching, etc.) ?PROCESS - Coordination of Care ?X - Simple Patient / Family Education for ongoing care 1 15 ?'[]'$  - 0 ?Complex (extensive) Patient / Family Education for ongoing care ?X- 1 10 ?Staff obtains Consents, Records, T Results / Process Orders ?est ?'[]'$  - 0 ?Staff telephones HHA, Nursing Homes / Clarify orders / etc ?'[]'$  - 0 ?Routine Transfer to another Facility (non-emergent condition) ?'[]'$  - 0 ?Routine Hospital Admission (non-emergent condition) ?'[]'$  - 0 ?New Admissions / Biomedical engineer / Ordering NPWT Apligraf, etc. ?, ?'[]'$  - 0 ?Emergency Hospital Admission (emergent condition) ?X- 1 10 ?Simple Discharge Coordination ?'[]'$  - 0 ?Complex (extensive) Discharge Coordination ?PROCESS - Special Needs ?'[]'$  - 0 ?Pediatric / Minor Patient Management ?'[]'$  - 0 ?Isolation Patient Management ?'[]'$  - 0 ?Hearing / Language / Visual special needs ?'[]'$  - 0 ?Assessment of Community assistance (transportation, D/C planning, etc.) ?'[]'$  -  0 ?Additional assistance / Altered mentation ?'[]'$  - 0 ?Support Surface(s) Assessment (bed, cushion, seat, etc.) ?INTERVENTIONS - Wound Cleansing / Measurement ?X - Simple Wound Cleansing - one wound  1 5 ?'[]'$  - 0 ?Complex Wound Cleansing - multiple wounds ?X- 1 5 ?Wound Imaging (photographs - any number of wounds) ?'[]'$  - 0 ?Wound Tracing (instead of photographs) ?X- 1 5 ?Simple Wound Measurement - one wound ?'[]'$  - 0 ?Complex Wound Measurement - multiple wounds ?INTERVENTIONS - Wound Dressings ?'[]'$  - 0 ?Small Wound Dressing one or multiple wounds ?X- 1 15 ?Medium Wound Dressing one or multiple wounds ?'[]'$  - 0 ?Large Wound Dressing one or multiple wounds ?'[]'$  - 0 ?Application of Medications - topical ?'[]'$  - 0 ?Application of Medications - injection ?INTERVENTIONS - Miscellaneous ?'[]'$  - 0 ?External ear exam ?'[]'$  - 0 ?Specimen Collection (cultures, biopsies, blood, body fluids, etc.) ?'[]'$  - 0 ?Specimen(s) / Culture(s) sent or taken to Lab for analysis ?'[]'$  - 0 ?Patient Transfer (multiple staff / Civil Service fast streamer / Similar devices) ?'[]'$  - 0 ?Simple Staple / Suture removal (25 or less) ?'[]'$  - 0 ?Complex Staple / Suture removal (26 or more) ?'[]'$  - 0 ?Hypo / Hyperglycemic Management (close monitor of Blood Glucose) ?'[]'$  - 0 ?Ankle / Brachial Index (ABI) - do not check if billed separately ?X- 1 5 ?Vital Signs ?Has the patient been seen at the hospital within the last three years: Yes ?Total Score: 105 ?Level Of Care: New/Established - Level 3 ?Electronic Signature(s) ?Signed: 01/12/2022 5:33:59 PM By: Deon Pilling RN, BSN ?Entered By: Deon Pilling on 01/12/2022 11:04:14 ?-------------------------------------------------------------------------------- ?Encounter Discharge Information Details ?Patient Name: Date of Service: ?Jane [name], Jane [name]. 01/12/2022 10:30 A [name] ?Medical Record Number: [id number] ?Patient Account Number: [id number] ?Date of Birth/Sex: Treating RN: ?1965-11-29 (56 y.o. F) [name], [name] ?Primary Care Jazsmine Macari: [name]  Other Clinician: ?Referring Chapman Matteucci: ?Treating Oland Arquette/Extender: [name], [name] ?[name]: 16 ?Encounter Discharge Information Items ?Discharge Condition: Stable ?Ambulatory Status: Wheelchair ?Discharge Destination: Home ?Transportation: Private Auto ?Accompanied By: family member ?Schedule Follow-up Appointment: Yes ?Clinical Summary of Care: ?Electronic Signature(s) ?Signed: 01/12/2022 5:33:59 PM By: Deon Pilling RN, BSN ?Entered By: Deon Pilling on 01/12/2022 12:30:58 ?-------------------------------------------------------------------------------- ?Lower Extremity Assessment Details ?Patient Name: Date of Service: ?Jane [name], Bailley [name]. 01/12/2022 10:30 A [name] ?Medical Record Number: [id number] ?Patient Account Number: [id number] ?Date of Birth/Sex: Treating RN: ?[birth year] (56 y.o. F) [name], [name] ?Primary Care Humberto Addo: [name] Other Clinician: ?Referring Markeese Boyajian: ?Treating Kaedon Fanelli/Extender: [name], [name] ?[name]: 16 ?Edema Assessment ?Assessed: [Left: No] [Right: Yes] ?Edema: [Left: Ye] [Right: s] ?Calf ?Left: Right: ?Point of Measurement: 30 cm From Medial Instep 34 cm ?Ankle ?Left: Right: ?Point of Measurement: 7 cm From Medial Instep 23 cm ?Vascular Assessment ?Pulses: ?Dorsalis Pedis ?Palpable: [Right:Yes] ?Electronic Signature(s) ?Signed: 01/12/2022 5:33:59 PM By: Deon Pilling RN, BSN ?Entered By: Deon Pilling on 01/12/2022 10:42:33 ?-------------------------------------------------------------------------------- ?Multi Wound Chart Details ?Patient Name: ?Date of Service: ?Jane [name], Keena [name]. 01/12/2022 10:30 A [name] ?Medical Record Number: [id number] ?Patient Account Number: [id number] ?Date of Birth/Sex: ?Treating RN: ?[birth year] (56 y.o. F) [name], [name] ?Primary Care Camry Robello: [name] ?Other Clinician: ?Referring Renwick Asman: ?Treating Knox Holdman/Extender: [name], [name] ?[name]: 16 ?Vital  Signs ?Height(in): 65 ?Pulse(bpm): 106 ?Weight(lbs): 130 ?Blood Pressure(mmHg): 146/81 ?Body Mass Index(BMI): 21.6 ?Temperature(??F): 98.1 ?Respiratory Rate(breaths/min): 20 ?Photos: [[name]:[name]] ?Right Calcaneus [name]

## 2022-01-13 DIAGNOSIS — N186 End stage renal disease: Secondary | ICD-10-CM | POA: Diagnosis not present

## 2022-01-13 DIAGNOSIS — D631 Anemia in chronic kidney disease: Secondary | ICD-10-CM | POA: Diagnosis not present

## 2022-01-13 DIAGNOSIS — N2581 Secondary hyperparathyroidism of renal origin: Secondary | ICD-10-CM | POA: Diagnosis not present

## 2022-01-14 DIAGNOSIS — D631 Anemia in chronic kidney disease: Secondary | ICD-10-CM | POA: Diagnosis not present

## 2022-01-14 DIAGNOSIS — N186 End stage renal disease: Secondary | ICD-10-CM | POA: Diagnosis not present

## 2022-01-14 DIAGNOSIS — N2581 Secondary hyperparathyroidism of renal origin: Secondary | ICD-10-CM | POA: Diagnosis not present

## 2022-01-15 DIAGNOSIS — D631 Anemia in chronic kidney disease: Secondary | ICD-10-CM | POA: Diagnosis not present

## 2022-01-15 DIAGNOSIS — N186 End stage renal disease: Secondary | ICD-10-CM | POA: Diagnosis not present

## 2022-01-15 DIAGNOSIS — N2581 Secondary hyperparathyroidism of renal origin: Secondary | ICD-10-CM | POA: Diagnosis not present

## 2022-01-16 DIAGNOSIS — N2581 Secondary hyperparathyroidism of renal origin: Secondary | ICD-10-CM | POA: Diagnosis not present

## 2022-01-16 DIAGNOSIS — I959 Hypotension, unspecified: Secondary | ICD-10-CM | POA: Diagnosis not present

## 2022-01-16 DIAGNOSIS — R627 Adult failure to thrive: Secondary | ICD-10-CM | POA: Diagnosis not present

## 2022-01-16 DIAGNOSIS — I5020 Unspecified systolic (congestive) heart failure: Secondary | ICD-10-CM | POA: Diagnosis not present

## 2022-01-16 DIAGNOSIS — L89613 Pressure ulcer of right heel, stage 3: Secondary | ICD-10-CM | POA: Diagnosis not present

## 2022-01-16 DIAGNOSIS — D631 Anemia in chronic kidney disease: Secondary | ICD-10-CM | POA: Diagnosis not present

## 2022-01-16 DIAGNOSIS — N186 End stage renal disease: Secondary | ICD-10-CM | POA: Diagnosis not present

## 2022-01-17 ENCOUNTER — Encounter (HOSPITAL_BASED_OUTPATIENT_CLINIC_OR_DEPARTMENT_OTHER): Payer: Medicare Other | Admitting: Internal Medicine

## 2022-01-17 ENCOUNTER — Other Ambulatory Visit: Payer: Self-pay

## 2022-01-17 DIAGNOSIS — N186 End stage renal disease: Secondary | ICD-10-CM | POA: Diagnosis not present

## 2022-01-17 DIAGNOSIS — N2581 Secondary hyperparathyroidism of renal origin: Secondary | ICD-10-CM | POA: Diagnosis not present

## 2022-01-17 DIAGNOSIS — L89613 Pressure ulcer of right heel, stage 3: Secondary | ICD-10-CM | POA: Diagnosis not present

## 2022-01-17 DIAGNOSIS — L89610 Pressure ulcer of right heel, unstageable: Secondary | ICD-10-CM | POA: Diagnosis not present

## 2022-01-17 DIAGNOSIS — I509 Heart failure, unspecified: Secondary | ICD-10-CM | POA: Diagnosis not present

## 2022-01-17 DIAGNOSIS — Z992 Dependence on renal dialysis: Secondary | ICD-10-CM | POA: Diagnosis not present

## 2022-01-17 DIAGNOSIS — F259 Schizoaffective disorder, unspecified: Secondary | ICD-10-CM

## 2022-01-17 DIAGNOSIS — D631 Anemia in chronic kidney disease: Secondary | ICD-10-CM | POA: Diagnosis not present

## 2022-01-17 NOTE — Progress Notes (Signed)
Jane [name] ([id number]) ?Visit Report for 01/17/2022 ?Chief Complaint Document Details ?Patient Name: Date of Service: ?Jane [name], Jane M. 01/17/2022 8:15 A M ?Medical Record Number: [id number] ?Patient Account Number: [id number] ?Date of Birth/Sex: Treating RN: ?March 30, 1966 (56 y.o. F) ?Primary Care Provider: [name] Other Clinician: ?Referring Provider: ?Treating Provider/Extender: [name], [name] ?[name]: 17 ?Information Obtained from: Patient ?Chief Complaint ?Right heel wound ?Electronic Signature(s) ?Signed: 01/17/2022 8:57:55 AM By: [name] DO ?Entered By: [name] on 01/17/2022 08:52:26 ?-------------------------------------------------------------------------------- ?Debridement Details ?Patient Name: Date of Service: ?Jane [name], Jane M. 01/17/2022 8:15 A M ?Medical Record Number: [id number] ?Patient Account Number: [id number] ?Date of Birth/Sex: Treating RN: ?1966/05/09 (56 y.o. F) [name], [name] ?Primary Care Provider: [name] Other Clinician: ?Referring Provider: ?Treating Provider/Extender: [name], [name] ?[name]: 17 ?Debridement Performed for Assessment: Wound #2 Right Calcaneus ?Performed By: Clinician , ?Debridement Type: Chemical/Enzymatic/Mechanical ?Agent Used: Santyl ?Level of Consciousness (Pre-procedure): Awake and Alert ?Pre-procedure Verification/Time Out No ?Taken: ?Bleeding: None ?Response to Treatment: Procedure was tolerated well ?Level of Consciousness (Post- Awake and Alert ?procedure): ?Post Debridement Measurements of Total Wound ?Length: (cm) 0.6 ?Stage: Category/Stage III ?Width: (cm) 0.9 ?Depth: (cm) 0.4 ?Volume: (cm?) 0.17 ?Character of Wound/Ulcer Post Debridement: Requires Further Debridement ?Post Procedure Diagnosis ?Same as Pre-procedure ?Electronic Signature(s) ?Signed: 01/17/2022 8:57:55 AM By: [name] DO ?Signed: 01/17/2022 4:42:50 PM By: Deon Pilling RN,  BSN ?Entered By: Deon Pilling on 01/17/2022 08:43:53 ?-------------------------------------------------------------------------------- ?HPI Details ?Patient Name: Date of Service: ?Jane [name], Jane M. 01/17/2022 8:15 A M ?Medical Record Number: [id number] ?Patient Account Number: [id number] ?Date of Birth/Sex: Treating RN: ?August 22, 1966 (56 y.o. F) ?Primary Care Provider: [name] Other Clinician: ?Referring Provider: ?Treating Provider/Extender: [name], [name] ?[name]: 17 ?History of Present Illness ?HPI Description: Admission 09/19/2021 ?Ms. Jane [name] is a 56 year old female with a past medical history of schizoaffective disorder, end-stage renal disease on peritoneal dialysis and seizure ?disorder that presents to the clinic for a right heel wound that has been present since her hospitalization in April 2022 because of an epidural abscess that ?required surgery. During her hospitalization she developed the heel wound and a breast wound. She would lean more aggressively to the left side for long ?periods of time. The breast wound has healed. The right heel Has developed a loose callus. She is not mobile and is either in her wheelchair or in the bed most ?of the day. She currently denies pain, increased warmth or erythema to the area or drainage. Son-in-law is present and helps with the history. Patient is unable ?to participate fully in her care due to her diagnosis of schizoaffective disorder. ?10/03/2021; patient presents for follow-up. She has developed a new pressure injury to the calcaneus and has skin breakdown to the surrounding area. She has ?been using Hydrofera Blue to the previous wound site. She has no issues or complaints today. She denies signs of infection. ?12/19; patient presents for follow-up. She has been using Hydrofera Blue to the wound sites. She went back and obtained TBI's. She currently denies signs of ?infection. ?1/3; patient presents for  follow-up. She has been using Hydrofera Blue to the medial wound and Santyl to the calcaneus wound. She has no issues or ?complaints today. She denies signs of infection. ?1/17; patient presents for follow-up. She has been using Hydrofera Blue to the medial wound along with Santyl. She been using Santyl to the calcaneus wound. ?She has no issues or complaints today. She  denies signs of infection. ?1/31; patient presents for follow-up. The medial wound remains closed. She has been using Santyl to the calcaneus wound. She has no issues or complaints ?today. She denies signs of infection. ?2/7; patient presents for follow-up. She has been using Santyl to the calcaneus wound. The medial wound remains closed but does have callus surrounding this. ?She is wearing her Prevalon boots however takes these off pretty regularly. She currently denies signs of infection. ?2/21; patient presents for follow-up. She continues to use Santyl to the calcaneus wound. She is unable to offload the right heel. Son states that the patient is ?often taking off the Prevalon boots and putting pressure on the heel. She currently denies signs of infection. ?2/28; patient presents for follow-up. She continues to use Santyl to the wound bed. She reports taking Augmentin with no issues. She denies systemic signs of ?infection. It is unclear if she can truly offload this wound. Her CT scan has not been scheduled yet. ?12/29/2021: CT scan was performed on March 7. The radiologist interpreted this as follows: "There is thinning of the posterolateral heel soft tissues coming within ?4 mm of the posterolateral calcaneus. There was no definitive cortical erosion to suggest acute osteomyelitis." Her course of Augmentin had been extended, ?but apparently there was some confusion at the pharmacy and they did not dispense this. The intake nurse contacted the pharmacy and they will provide the ?prescription, but this means she has not had any antibiotics since  Saturday. She continues to be in [place] and silver alginate. Her son is with her today and ?reports that she does have Prevalon boots, but that she often slides her foot around inside the boot. ?3/23; patient presents for follow-up. She completed her course of Augmentin. She reports improvement in wound healing. She currently denies systemic signs ?of infection. ?3/28; patient presents for follow-up. She has no issues or complaints. The wound culture has not resulted. She denies signs of systemic infection. She ?continues to use Santyl and silver alginate with dressing changes. ?Electronic Signature(s) ?Signed: 01/17/2022 8:57:55 AM By: [name] DO ?Entered By: [name] on 01/17/2022 08:53:12 ?-------------------------------------------------------------------------------- ?Physical Exam Details ?Patient Name: Date of Service: ?Jane [name], Jane M. 01/17/2022 8:15 A M ?Medical Record Number: [id number] ?Patient Account Number: [id number] ?Date of Birth/Sex: Treating RN: ?1966/07/28 (56 y.o. F) ?Primary Care Provider: [name] Other Clinician: ?Referring Provider: ?Treating Provider/Extender: [name], [name] ?[name]: 17 ?Constitutional ?respirations regular, non-labored and within target range for patient.Marland Kitchen ?Cardiovascular ?2+ dorsalis pedis/posterior tibialis pulses. ?Notes ?Right foot: T the heel there is an open wound with circumferential callus and granulation tissue with a darkened hue. The depth is not as deep as last clinic ?o ?visit. No surrounding signs of infection. ?Electronic Signature(s) ?Signed: 01/17/2022 8:57:55 AM By: [name] DO ?Entered By: [name] on 01/17/2022 08:54:20 ?-------------------------------------------------------------------------------- ?Physician Orders Details ?Patient Name: Date of Service: ?Jane [name], Jane M. 01/17/2022 8:15 A M ?Medical Record Number: [id number] ?Patient Account Number: [id number] ?Date of  Birth/Sex: Treating RN: ?23-Feb-1966 (56 y.o. F) [name], [name] ?Primary Care Provider: [name] Other Clinician: ?Referring Provider: ?Treating Provider/Extender: [name], [name]

## 2022-01-17 NOTE — Progress Notes (Signed)
ENYAH, MOMAN ([id number]) ?Visit Report for 01/17/2022 ?Arrival Information Details ?Patient Name: Date of Service: ?Jane [name], Jane M. 01/17/2022 8:15 A M ?Medical Record Number: [id number] ?Patient Account Number: [id number] ?Date of Birth/Sex: Treating RN: ?1966-04-21 (56 y.o. F) [name], [name] ?Primary Care Fiorella Hanahan: [name] Other Clinician: ?Referring Mikhala Kenan: ?Treating Lathyn Griggs/Extender: [name], [name] ?[name]: 17 ?Visit Information History Since Last Visit ?Added or deleted any medications: No ?Patient Arrived: Wheel Chair ?Any new allergies or adverse reactions: No ?Arrival Time: 07:53 ?Had a fall or experienced change in No ?Accompanied By: family member ?activities of daily living that may affect ?Transfer Assistance: Manual ?risk of falls: ?Patient Identification Verified: Yes ?Signs or symptoms of abuse/neglect since last visito No ?Secondary Verification Process Completed: Yes ?Hospitalized since last visit: No ?Patient Requires Transmission-Based Precautions: No ?Implantable device outside of the clinic excluding No ?Patient Has Alerts: Yes ?cellular tissue based products placed in the center ?Patient Alerts: R ABI=Non Comp since last visit: ?Has Dressing in Place as Prescribed: Yes ?Pain Present Now: No ?Electronic Signature(s) ?Signed: 01/17/2022 4:42:50 PM By: Deon Pilling RN, BSN ?Entered By: Deon Pilling on 01/17/2022 07:53:34 ?-------------------------------------------------------------------------------- ?Encounter Discharge Information Details ?Patient Name: Date of Service: ?Jane [name], Jane M. 01/17/2022 8:15 A M ?Medical Record Number: [id number] ?Patient Account Number: [id number] ?Date of Birth/Sex: Treating RN: ?June 18, 1966 (56 y.o. F) [name], [name] ?Primary Care Koven Belinsky: [name] Other Clinician: ?Referring Benett Swoyer: ?Treating Hadrian Yarbrough/Extender: [name], [name] ?[name]: 17 ?Encounter Discharge  Information Items Post Procedure Vitals ?Discharge Condition: Stable ?Temperature (F): 98.1 ?Ambulatory Status: [name] ?Pulse (bpm): 128 ?Discharge Destination: Home ?Respiratory Rate (breaths/min): 20 ?Transportation: Private Auto ?Blood Pressure (mmHg): 142/92 ?Accompanied By: family member ?Schedule Follow-up Appointment: Yes ?Clinical Summary of Care: ?Electronic Signature(s) ?Signed: 01/17/2022 4:42:50 PM By: Deon Pilling RN, BSN ?Entered By: Deon Pilling on 01/17/2022 08:44:34 ?-------------------------------------------------------------------------------- ?Lower Extremity Assessment Details ?Patient Name: ?Date of Service: ?Jane [name], Jane M. 01/17/2022 8:15 A M ?Medical Record Number: [id number] ?Patient Account Number: [id number] ?Date of Birth/Sex: ?Treating RN: ?1966/06/15 (56 y.o. F) [name], [name] ?Primary Care Russ Looper: [name] ?Other Clinician: ?Referring Matai Carpenito: ?Treating Maraki Macquarrie/Extender: [name], [name] ?[name]: 17 ?Edema Assessment ?Assessed: [Left: No] [Right: Yes] ?Edema: [Left: Ye] [Right: s] ?Calf ?Left: Right: ?Point of Measurement: 30 cm From Medial Instep 37 cm ?Ankle ?Left: Right: ?Point of Measurement: 7 cm From Medial Instep 22 cm ?Vascular Assessment ?Pulses: ?Dorsalis Pedis ?Palpable: [Right:Yes] ?Electronic Signature(s) ?Signed: 01/17/2022 4:42:50 PM By: Deon Pilling RN, BSN ?Entered By: Deon Pilling on 01/17/2022 07:55:32 ?-------------------------------------------------------------------------------- ?Multi Wound Chart Details ?Patient Name: ?Date of Service: ?Jane [name], Jane M. 01/17/2022 8:15 A M ?Medical Record Number: [id number] ?Patient Account Number: [id number] ?Date of Birth/Sex: ?Treating RN: ?[birth year] (56 y.o. F) ?Primary Care Steed Kanaan: [name] ?Other Clinician: ?Referring Zaiah Eckerson: ?Treating Keldan Eplin/Extender: [name], [name] ?[name]: 17 ?Vital Signs ?Height(in): 65 ?Pulse(bpm):  128 ?Weight(lbs): 130 ?Blood Pressure(mmHg): 142/92 ?Body Mass Index(BMI): 21.6 ?Temperature(??F): 98.1 ?Respiratory Rate(breaths/min): 20 ?Photos: [2:Right Calcaneus] [[name]:[name] [name]] ?Wound Location: [2:Pressure Injury] [[name]:[name]] ?Wounding Event: [2:Pressure Ulcer] [[name]:[name]] ?Primary Etiology: [2:Cataracts, Anemia, Congestive Heart [name]] ?Comorbid History: [2:Failure, Hypotension, End Stage Renal Disease, Seizure Disorder 10/03/2021] [[name]:[name]] ?Date Acquired: [2:15] [[name]:[name]] ?Weeks of Treatment: [2:Open] [[name]:[name]] ?Wound Status: [2:No] [[name]:[name]] ?Wound Recurrence: [2:0.6x0.9x0.4] [[name]:[name]] ?Measurements L x W x D (cm) [2:0.424] [[name]:[name]] ?A (cm?) : ?rea [2:0.17] [[name]:[name]] ?Volume (cm?) : [2:92.00%] [[name]:[name]] ?% Reduction in A [2:rea: 67.80%] [[name]:[name]] ?% Reduction in Volume: [2:10] ?Starting  Position 1 (o'clock): [2:2] ?Ending Position 1 (o'clock): [2:0.4] ?Maximum Distance 1 (cm): [2:Yes] [[name]:[name]] ?Undermining: [2:Category/Stage III] [[name]:[name]] ?Classification: [2:Medium] [[name]:[name]] ?Exudate A mount: [2:Serosanguineous] [[name]:[name]] ?Exudate Type: [2:red, brown] [[name]:[name]] ?Exudate Color: [2:Distinct, outline attached] [[name]:[name]] ?Wound Margin: [2:Small (1-33%)] [[name]:[name]] ?Granulation A mount: [2:Pink, Pale] [[name]:[name]] ?Granulation Quality: [2:Large (67-100%)] [[name]:[name]] ?Necrotic A mount: ?[2:Fat Layer (Subcutaneous Tissue): Yes [name]] ?Exposed Structures: ?[2:Fascia: No Tendon: No Muscle: No Joint: No Bone: No Small (1-33%)] [[name]:[name]] ?Epithelialization: [2:Chemical/Enzymatic/Mechanical] [[name]:[name]] ?Debridement: [2:[name]] [[name]:[name]] ?Instrument: [2:None] [[name]:[name]] ?Bleeding: ?Debridement Treatment Response: Procedure was tolerated well [[name]:[name]] ?Post Debridement Measurements L x 0.6x0.9x0.4 [[name]:[name]] ?W x D (cm) [2:0.17] [[name]:[name]] ?Post Debridement Volume: (cm?) [2:Category/Stage III] [[name]:[name]] ?Post Debridement Stage: [2:callous to periwound.] [[name]:[name]] ?Assessment Notes: [2:Debridement] [[name]:[name]] ?Treatment Notes ?Wound #2  (Calcaneus) Wound Laterality: Right ?Cleanser ?Soap and Water ?Discharge Instruction: May shower and wash wound with dial antibacterial soap and water prior to dressing change. ?Peri-Wound Care ?Zinc Oxide Ointment 30g tube ?Discharge Instruction: Apply Zinc Oxide around the wound bed with each dressing change ?Sween Lotion (Moisturizing lotion) ?Discharge Instruction: Apply moisturizing lotion as directed ?Topical ?Primary Dressing ?KerraCel Ag Gelling Fiber Dressing, 2x2 in (silver alginate) ?Discharge Instruction: Apply silver alginate over the santyl into undermining and wound bed. ?Santyl Ointment ?Discharge Instruction: Apply nickel thick amount to wound bed as instructed ?Secondary Dressing ?Woven Gauze Sponge, Non-Sterile 4x4 in ?Discharge Instruction: Cover with gauze. ?ABD Pad, 8x10 ?Discharge Instruction: Apply abd pad over the gauze. ?Secured With ?Kerlix Roll Sterile, 4.5x3.1 (in/yd) ?Discharge Instruction: Secure with Kerlix as directed. ?Paper Tape, 2x10 (in/yd) ?Discharge Instruction: Secure dressing with tape as directed. ?Compression Wrap ?Compression Stockings ?Add-Ons ?Electronic Signature(s) ?Signed: 01/17/2022 8:57:55 AM By: [name] DO ?Entered By: [name] on 01/17/2022 08:52:04 ?-------------------------------------------------------------------------------- ?Multi-Disciplinary Care Plan Details ?Patient Name: ?Date of Service: ?Jane [name], Jane M. 01/17/2022 8:15 A M ?Medical Record Number: [id number] ?Patient Account Number: [id number] ?Date of Birth/Sex: ?Treating RN: ?1966-07-30 (56 y.o. F) [name], [name] ?Primary Care Lucillie Kiesel: [name] ?Other Clinician: ?Referring Zarah Carbon: ?Treating Addyson Traub/Extender: [name], [name] ?[name]: 17 ?Active Inactive ?Pressure ?Nursing Diagnoses: ?Knowledge deficit related to causes and risk factors for pressure ulcer development ?Goals: ?Patient will remain free from development of additional pressure  ulcers ?Date Initiated: 09/19/2021 ?Target Resolution Date: 02/16/2022 ?Goal Status: Active ?Interventions: ?Assess: immobility, friction, shearing, incontinence upon admission and as needed ?Assess offlo

## 2022-01-18 DIAGNOSIS — N2581 Secondary hyperparathyroidism of renal origin: Secondary | ICD-10-CM | POA: Diagnosis not present

## 2022-01-18 DIAGNOSIS — D631 Anemia in chronic kidney disease: Secondary | ICD-10-CM | POA: Diagnosis not present

## 2022-01-18 DIAGNOSIS — N186 End stage renal disease: Secondary | ICD-10-CM | POA: Diagnosis not present

## 2022-01-19 DIAGNOSIS — I5020 Unspecified systolic (congestive) heart failure: Secondary | ICD-10-CM | POA: Diagnosis not present

## 2022-01-19 DIAGNOSIS — N2581 Secondary hyperparathyroidism of renal origin: Secondary | ICD-10-CM | POA: Diagnosis not present

## 2022-01-19 DIAGNOSIS — N186 End stage renal disease: Secondary | ICD-10-CM | POA: Diagnosis not present

## 2022-01-19 DIAGNOSIS — I959 Hypotension, unspecified: Secondary | ICD-10-CM | POA: Diagnosis not present

## 2022-01-19 DIAGNOSIS — R627 Adult failure to thrive: Secondary | ICD-10-CM | POA: Diagnosis not present

## 2022-01-19 DIAGNOSIS — D631 Anemia in chronic kidney disease: Secondary | ICD-10-CM | POA: Diagnosis not present

## 2022-01-19 DIAGNOSIS — L89613 Pressure ulcer of right heel, stage 3: Secondary | ICD-10-CM | POA: Diagnosis not present

## 2022-01-20 DIAGNOSIS — Z992 Dependence on renal dialysis: Secondary | ICD-10-CM | POA: Diagnosis not present

## 2022-01-20 DIAGNOSIS — N186 End stage renal disease: Secondary | ICD-10-CM | POA: Diagnosis not present

## 2022-01-20 DIAGNOSIS — N2581 Secondary hyperparathyroidism of renal origin: Secondary | ICD-10-CM | POA: Diagnosis not present

## 2022-01-20 DIAGNOSIS — D631 Anemia in chronic kidney disease: Secondary | ICD-10-CM | POA: Diagnosis not present

## 2022-01-21 DIAGNOSIS — N186 End stage renal disease: Secondary | ICD-10-CM | POA: Diagnosis not present

## 2022-01-21 DIAGNOSIS — N2581 Secondary hyperparathyroidism of renal origin: Secondary | ICD-10-CM | POA: Diagnosis not present

## 2022-01-21 DIAGNOSIS — D631 Anemia in chronic kidney disease: Secondary | ICD-10-CM | POA: Diagnosis not present

## 2022-01-22 DIAGNOSIS — N2581 Secondary hyperparathyroidism of renal origin: Secondary | ICD-10-CM | POA: Diagnosis not present

## 2022-01-22 DIAGNOSIS — D631 Anemia in chronic kidney disease: Secondary | ICD-10-CM | POA: Diagnosis not present

## 2022-01-22 DIAGNOSIS — N186 End stage renal disease: Secondary | ICD-10-CM | POA: Diagnosis not present

## 2022-01-23 DIAGNOSIS — I959 Hypotension, unspecified: Secondary | ICD-10-CM | POA: Diagnosis not present

## 2022-01-23 DIAGNOSIS — N2581 Secondary hyperparathyroidism of renal origin: Secondary | ICD-10-CM | POA: Diagnosis not present

## 2022-01-23 DIAGNOSIS — N186 End stage renal disease: Secondary | ICD-10-CM | POA: Diagnosis not present

## 2022-01-23 DIAGNOSIS — F71 Moderate intellectual disabilities: Secondary | ICD-10-CM | POA: Diagnosis not present

## 2022-01-23 DIAGNOSIS — L89613 Pressure ulcer of right heel, stage 3: Secondary | ICD-10-CM | POA: Diagnosis not present

## 2022-01-23 DIAGNOSIS — F29 Unspecified psychosis not due to a substance or known physiological condition: Secondary | ICD-10-CM | POA: Diagnosis not present

## 2022-01-23 DIAGNOSIS — F250 Schizoaffective disorder, bipolar type: Secondary | ICD-10-CM | POA: Diagnosis not present

## 2022-01-23 DIAGNOSIS — R627 Adult failure to thrive: Secondary | ICD-10-CM | POA: Diagnosis not present

## 2022-01-23 DIAGNOSIS — F319 Bipolar disorder, unspecified: Secondary | ICD-10-CM | POA: Diagnosis not present

## 2022-01-23 DIAGNOSIS — D631 Anemia in chronic kidney disease: Secondary | ICD-10-CM | POA: Diagnosis not present

## 2022-01-23 DIAGNOSIS — I5020 Unspecified systolic (congestive) heart failure: Secondary | ICD-10-CM | POA: Diagnosis not present

## 2022-01-24 ENCOUNTER — Encounter (HOSPITAL_BASED_OUTPATIENT_CLINIC_OR_DEPARTMENT_OTHER): Payer: Medicare Other | Attending: Internal Medicine | Admitting: Internal Medicine

## 2022-01-24 DIAGNOSIS — Z992 Dependence on renal dialysis: Secondary | ICD-10-CM | POA: Insufficient documentation

## 2022-01-24 DIAGNOSIS — L84 Corns and callosities: Secondary | ICD-10-CM | POA: Diagnosis not present

## 2022-01-24 DIAGNOSIS — I509 Heart failure, unspecified: Secondary | ICD-10-CM | POA: Insufficient documentation

## 2022-01-24 DIAGNOSIS — N2581 Secondary hyperparathyroidism of renal origin: Secondary | ICD-10-CM | POA: Diagnosis not present

## 2022-01-24 DIAGNOSIS — F259 Schizoaffective disorder, unspecified: Secondary | ICD-10-CM | POA: Insufficient documentation

## 2022-01-24 DIAGNOSIS — D631 Anemia in chronic kidney disease: Secondary | ICD-10-CM | POA: Diagnosis not present

## 2022-01-24 DIAGNOSIS — L89610 Pressure ulcer of right heel, unstageable: Secondary | ICD-10-CM | POA: Diagnosis not present

## 2022-01-24 DIAGNOSIS — L89613 Pressure ulcer of right heel, stage 3: Secondary | ICD-10-CM | POA: Insufficient documentation

## 2022-01-24 DIAGNOSIS — N186 End stage renal disease: Secondary | ICD-10-CM | POA: Diagnosis not present

## 2022-01-24 NOTE — Progress Notes (Signed)
LOGYN, DEDOMINICIS ([id number]) ?Visit Report for 01/24/2022 ?Chief Complaint Document Details ?Patient Name: Date of Service: ?Jane [name], Jane M. 01/24/2022 10:30 A M ?Medical Record Number: [id number] ?Patient Account Number: [id number] ?Date of Birth/Sex: Treating RN: ?04-30-1966 (56 y.o. F) [name], [name] ?Primary Care Provider: [name] Other Clinician: ?Referring Provider: ?Treating Provider/Extender: [name], [name] ?Information Obtained from: Patient ?Chief Complaint ?Right heel wound ?Electronic Signature(s) ?Signed: 01/24/2022 11:49:02 AM By: [name] DO ?Entered By: [name] on 01/24/2022 10:52:07 ?-------------------------------------------------------------------------------- ?Debridement Details ?Patient Name: Date of Service: ?Jane [name], Jane M. 01/24/2022 10:30 A M ?Medical Record Number: [id number] ?Patient Account Number: [id number] ?Date of Birth/Sex: Treating RN: ?Mar 10, 1966 (56 y.o. F) [name], [name] ?Primary Care Provider: [name] Other Clinician: ?Referring Provider: ?Treating Provider/Extender: [name], [name] ?Debridement Performed for Assessment: Wound #2 Right Calcaneus ?Performed By: Physician [name], DO ?Debridement Type: Debridement ?Level of Consciousness (Pre-procedure): Awake and Alert ?Pre-procedure Verification/Time Out Yes - 10:30 ?Taken: ?Start Time: 10:31 ?Pain Control: Lidocaine 5% topical ointment ?T Area Debrided (L x W): ?otal 2 (cm) x 1 (cm) = 2 (cm?) ?Tissue and other material debrided: Viable, Non-Viable, Callus, Slough, Subcutaneous, Slough ?Level: Skin/Subcutaneous Tissue ?Debridement Description: Excisional ?Instrument: Curette ?Bleeding: Minimum ?Hemostasis Achieved: Pressure ?End Time: 10:37 ?Procedural Pain: 0 ?Post Procedural Pain: 0 ?Response to Treatment: Procedure was tolerated well ?Level of Consciousness (Post- Awake and  Alert ?procedure): ?Post Debridement Measurements of Total Wound ?Length: (cm) 0.5 ?Stage: Category/Stage III ?Width: (cm) 1 ?Depth: (cm) 0.6 ?Volume: (cm?) 0.236 ?Character of Wound/Ulcer Post Debridement: Improved ?Post Procedure Diagnosis ?Same as Pre-procedure ?Electronic Signature(s) ?Signed: 01/24/2022 11:49:02 AM By: [name] DO ?Signed: 01/24/2022 4:56:30 PM By: Deon Pilling RN, BSN ?Entered By: Deon Pilling on 01/24/2022 10:43:19 ?-------------------------------------------------------------------------------- ?HPI Details ?Patient Name: Date of Service: ?Jane [name], Jane M. 01/24/2022 10:30 A M ?Medical Record Number: [id number] ?Patient Account Number: [id number] ?Date of Birth/Sex: Treating RN: ?[birth year] (56 y.o. F) [name], [name] ?Primary Care Provider: [name] Other Clinician: ?Referring Provider: ?Treating Provider/Extender: [name], [name] ?History of Present Illness ?HPI Description: Admission 09/19/2021 ?Jane [name] is a 56 year old female with a past medical history of schizoaffective disorder, end-stage renal disease on peritoneal dialysis and seizure ?disorder that presents to the clinic for a right heel wound that has been present since her hospitalization in April 2022 because of an epidural abscess that ?required surgery. During her hospitalization she developed the heel wound and a breast wound. She would lean more aggressively to the left side for long ?periods of time. The breast wound has healed. The right heel Has developed a loose callus. She is not mobile and is either in her wheelchair or in the bed most ?of the day. She currently denies pain, increased warmth or erythema to the area or drainage. Son-in-law is present and helps with the history. Patient is unable ?to participate fully in her care due to her diagnosis of schizoaffective disorder. ?10/03/2021; patient presents for follow-up. She has developed a new  pressure injury to the calcaneus and has skin breakdown to the surrounding area. She has ?been using Hydrofera Blue to the previous wound site. She has no issues or complaints today. She denies signs of infection. ?12/19; patient presents for follow-up. She has been using Hydrofera Blue to the wound sites. She went back and obtained TBI's. She currently denies signs of ?infection. ?1/3; patient presents for follow-up. She has been  using Hydrofera Blue to the medial wound and Santyl to the calcaneus wound. She has no issues or ?complaints today. She denies signs of infection. ?1/17; patient presents for follow-up. She has been using Hydrofera Blue to the medial wound along with Santyl. She been using Santyl to the calcaneus wound. ?She has no issues or complaints today. She denies signs of infection. ?1/31; patient presents for follow-up. The medial wound remains closed. She has been using Santyl to the calcaneus wound. She has no issues or complaints ?today. She denies signs of infection. ?2/7; patient presents for follow-up. She has been using Santyl to the calcaneus wound. The medial wound remains closed but does have callus surrounding this. ?She is wearing her Prevalon boots however takes these off pretty regularly. She currently denies signs of infection. ?2/21; patient presents for follow-up. She continues to use Santyl to the calcaneus wound. She is unable to offload the right heel. Son states that the patient is ?often taking off the Prevalon boots and putting pressure on the heel. She currently denies signs of infection. ?2/28; patient presents for follow-up. She continues to use Santyl to the wound bed. She reports taking Augmentin with no issues. She denies systemic signs of ?infection. It is unclear if she can truly offload this wound. Her CT scan has not been scheduled yet. ?12/29/2021: CT scan was performed on March 7. The radiologist interpreted this as follows: "There is thinning of the posterolateral  heel soft tissues coming within ?4 mm of the posterolateral calcaneus. There was no definitive cortical erosion to suggest acute osteomyelitis." Her course of Augmentin had been extended, ?but apparently there was some confusion at the pharmacy and they did not dispense this. The intake nurse contacted the pharmacy and they will provide the ?prescription, but this means she has not had any antibiotics since Saturday. She continues to be in [place] and silver alginate. Her son is with her today and ?reports that she does have Prevalon boots, but that she often slides her foot around inside the boot. ?3/23; patient presents for follow-up. She completed her course of Augmentin. She reports improvement in wound healing. She currently denies systemic signs ?of infection. ?3/28; patient presents for follow-up. She has no issues or complaints. The wound culture has not resulted. She denies signs of systemic infection. She ?continues to use Santyl and silver alginate with dressing changes. ?4/4; patient presents for follow-up. She has no issues or complaints today. She has been using Santyl and silver alginate to the wound. She had a PCR culture ?done on 3/23 that showed no growth. She currently denies signs of infection. ?Electronic Signature(s) ?Signed: 01/24/2022 11:49:02 AM By: [name] DO ?Entered By: [name] on 01/24/2022 10:55:19 ?-------------------------------------------------------------------------------- ?Physical Exam Details ?Patient Name: Date of Service: ?Jane [name], Jane M. 01/24/2022 10:30 A M ?Medical Record Number: [id number] ?Patient Account Number: [id number] ?Date of Birth/Sex: Treating RN: ?June 21, 1966 (56 y.o. F) [name], [name] ?Primary Care Provider: [name] Other Clinician: ?Referring Provider: ?Treating Provider/Extender: [name], [name] ?Constitutional ?respirations regular, non-labored and within target range for  patient.Marland Kitchen ?Cardiovascular ?2+ dorsalis pedis/posterior tibialis pulses. ?Psychiatric ?pleasant and cooperative. ?Notes ?Right foot: T the heel there is an open wound with granulation tissue and nonviable tissue with circumferential ca

## 2022-01-24 NOTE — Progress Notes (Signed)
ALEXZANDRA, BILTON ([id number]) ?Visit Report for 01/24/2022 ?Arrival Information Details ?Patient Name: Date of Service: ?[name], Raye M. 01/24/2022 10:30 A M ?Medical Record Number: [id number] ?Patient Account Number: [id number] ?Date of Birth/Sex: Treating RN: ?[birth year] (56 y.o. F) [name], [name] ?Primary Care Bali Lyn: [name] Other Clinician: ?Referring Oluwanifemi Susman: ?Treating Nicolet Griffy/Extender: [name], [name] ?Visit Information History Since Last Visit ?Added or deleted any medications: No ?Patient Arrived: Wheel Chair ?Any new allergies or adverse reactions: No ?Arrival Time: 10:10 ?Had a fall or experienced change in No ?Accompanied By: brother in law ?activities of daily living that may affect ?Transfer Assistance: Manual ?risk of falls: ?Patient Identification Verified: Yes ?Signs or symptoms of abuse/neglect since last visito No ?Secondary Verification Process Completed: Yes ?Hospitalized since last visit: No ?Patient Requires Transmission-Based Precautions: No ?Implantable device outside of the clinic excluding No ?Patient Has Alerts: Yes ?cellular tissue based products placed in the center ?Patient Alerts: R ABI=Non Comp since last visit: ?Has Dressing in Place as Prescribed: Yes ?Pain Present Now: No ?Electronic Signature(s) ?Signed: 01/24/2022 4:56:30 PM By: Deon Pilling RN, BSN ?Entered By: Deon Pilling on 01/24/2022 10:14:28 ?-------------------------------------------------------------------------------- ?Encounter Discharge Information Details ?Patient Name: Date of Service: ?[name], Rosabella M. 01/24/2022 10:30 A M ?Medical Record Number: [id number] ?Patient Account Number: [id number] ?Date of Birth/Sex: Treating RN: ?[birth year] (56 y.o. F) [name], [name] ?Primary Care Emauri Krygier: [name] Other Clinician: ?Referring Nazaria Riesen: ?Treating Ellyana Crigler/Extender: [name], [name] ?Encounter Discharge  Information Items Post Procedure Vitals ?Discharge Condition: Stable ?Pulse (bpm): 125 ?Ambulatory Status: Wheelchair ?Respiratory Rate (breaths/min): 20 ?Discharge Destination: Home ?Blood Pressure (mmHg): 157/88 ?Transportation: Private Auto ?Unable to obtain vitals Reason: unable to obtain temperature. ?Accompanied By: brother in law ?Schedule Follow-up Appointment: Yes ?Clinical Summary of Care: ?Electronic Signature(s) ?Signed: 01/24/2022 4:56:30 PM By: Deon Pilling RN, BSN ?Entered By: Deon Pilling on 01/24/2022 10:44:53 ?-------------------------------------------------------------------------------- ?Lower Extremity Assessment Details ?Patient Name: ?Date of Service: ?[name], Saige M. 01/24/2022 10:30 A M ?Medical Record Number: [id number] ?Patient Account Number: [id number] ?Date of Birth/Sex: ?Treating RN: ?September 25, 1966 (56 y.o. F) [name], [name] ?Primary Care Niamh Rada: [name] ?Other Clinician: ?Referring Armeda Plumb: ?Treating Calbert Hulsebus/Extender: [name], [name] ?Edema Assessment ?Assessed: [Left: No] [Right: No] ?Edema: [Left: Ye] [Right: s] ?Calf ?Left: Right: ?Point of Measurement: 30 cm From Medial Instep 34 cm ?Ankle ?Left: Right: ?Point of Measurement: 7 cm From Medial Instep 24 cm ?Vascular Assessment ?Pulses: ?Dorsalis Pedis ?Palpable: [Right:Yes] ?Electronic Signature(s) ?Signed: 01/24/2022 4:56:30 PM By: Deon Pilling RN, BSN ?Entered By: Deon Pilling on 01/24/2022 10:15:57 ?-------------------------------------------------------------------------------- ?Multi Wound Chart Details ?Patient Name: ?Date of Service: ?[name], Terriann M. 01/24/2022 10:30 A M ?Medical Record Number: [id number] ?Patient Account Number: [id number] ?Date of Birth/Sex: ?Treating RN: ?1966/04/04 (56 y.o. F) [name], [name] ?Primary Care Xane Amsden: [name] ?Other Clinician: ?Referring Alenah Sarria: ?Treating Benson Porcaro/Extender: [name], [name] ?Weeks in  Treatment: 18 ?Vital Signs ?Height(in): 65 ?Pulse(bpm): 125 ?Weight(lbs): 130 ?Blood Pressure(mmHg): 157/88 ?Body Mass Index(BMI): 21.6 ?Temperature(??F): ?Respiratory Rate(breaths/min): 20 ?Photos: [2:Right Calcaneus] [N/A:N/A N/A] ?Wound Location: [2:Pressure Injury] [N/A:N/A] ?Wounding Event: [2:Pressure Ulcer] [N/A:N/A] ?Primary Etiology: [2:Cataracts, Anemia, Congestive Heart N/A] ?Comorbid History: [2:Failure, Hypotension, End Stage Renal Disease, Seizure Disorder 10/03/2021] [N/A:N/A] ?Date Acquired: [2:16] [N/A:N/A] ?Weeks of Treatment: [2:Open] [N/A:N/A] ?Wound Status: [2:No] [N/A:N/A] ?Wound Recurrence: [2:0.5x1x0.6] [N/A:N/A] ?Measurements L x W x D (cm) [2:0.393] [N/A:N/A] ?A (cm?) : ?rea [2:0.236] [N/A:N/A] ?Volume (cm?) : [2:92.60%] [N/A:N/A] ?% Reduction in A [  2:rea: 55.30%] [N/A:N/A] ?% Reduction in Volume: [2:11] ?Starting Position 1 (o'clock): [2:1] ?Ending Position 1 (o'clock): [2:0.4] ?Maximum Distance 1 (cm): [2:Yes] [N/A:N/A] ?Undermining: [2:Category/Stage III] [N/A:N/A] ?Classification: [2:Medium] [N/A:N/A] ?Exudate A mount: [2:Serosanguineous] [N/A:N/A] ?Exudate Type: [2:red, brown] [N/A:N/A] ?Exudate Color: [2:Distinct, outline attached] [N/A:N/A] ?Wound Margin: [2:Medium (34-66%)] [N/A:N/A] ?Granulation A mount: [2:Pink, Pale] [N/A:N/A] ?Granulation Quality: [2:Medium (34-66%)] [N/A:N/A] ?Necrotic A mount: ?[2:Fat Layer (Subcutaneous Tissue): Yes N/A] ?Exposed Structures: ?[2:Fascia: No Tendon: No Muscle: No Joint: No Bone: No Small (1-33%)] [N/A:N/A] ?Epithelialization: [2:Debridement - Excisional] [N/A:N/A] ?Debridement: ?Pre-procedure Verification/Time Out 10:30 [N/A:N/A] ?Taken: [2:Lidocaine 5% topical ointment] [N/A:N/A] ?Pain Control: [2:Callus, Subcutaneous, Slough] [N/A:N/A] ?Tissue Debrided: [2:Skin/Subcutaneous Tissue] [N/A:N/A] ?Level: [2:2] [N/A:N/A] ?Debridement A (sq cm): [2:rea Curette] [N/A:N/A] ?Instrument: [2:Minimum] [N/A:N/A] ?Bleeding: [2:Pressure]  [N/A:N/A] ?Hemostasis A chieved: [2:0] [N/A:N/A] ?Procedural Pain: [2:0] [N/A:N/A] ?Post Procedural Pain: [2:Procedure was tolerated well] [N/A:N/A] ?Debridement Treatment Response: [2:0.5x1x0.6] [N/A:N/A] ?Post Debridement Measurements L x ?W x D (cm) [2:0.236] [N/A:N/A] ?Post Debridement Volume: (cm?) [2:Category/Stage III] [N/A:N/A] ?Post Debridement Stage: [2:callous to periwound.] [N/A:N/A] ?Assessment Notes: [2:Debridement] [N/A:N/A] ?Treatment Notes ?Wound #2 (Calcaneus) Wound Laterality: Right ?Cleanser ?Soap and Water ?Discharge Instruction: May shower and wash wound with dial antibacterial soap and water prior to dressing change. ?Peri-Wound Care ?Zinc Oxide Ointment 30g tube ?Discharge Instruction: Apply Zinc Oxide around the wound bed with each dressing change ?Sween Lotion (Moisturizing lotion) ?Discharge Instruction: Apply moisturizing lotion as directed ?Topical ?Primary Dressing ?KerraCel Ag Gelling Fiber Dressing, 2x2 in (silver alginate) ?Discharge Instruction: Apply silver alginate over the santyl into undermining and wound bed. ?Santyl Ointment ?Discharge Instruction: Apply nickel thick amount to wound bed as instructed ?Secondary Dressing ?Woven Gauze Sponge, Non-Sterile 4x4 in ?Discharge Instruction: Cover with gauze. ?ABD Pad, 8x10 ?Discharge Instruction: Apply abd pad over the gauze. ?Secured With ?Kerlix Roll Sterile, 4.5x3.1 (in/yd) ?Discharge Instruction: Secure with Kerlix as directed. ?Paper Tape, 2x10 (in/yd) ?Discharge Instruction: Secure dressing with tape as directed. ?Compression Wrap ?Compression Stockings ?Add-Ons ?Electronic Signature(s) ?Signed: 01/24/2022 11:49:02 AM By: [name] DO ?Signed: 01/24/2022 4:56:30 PM By: Deon Pilling RN, BSN ?Entered By: [name] on 01/24/2022 10:51:49 ?-------------------------------------------------------------------------------- ?Multi-Disciplinary Care Plan Details ?Patient Name: ?Date of Service: ?[name], Katera M. 01/24/2022  10:30 A M ?Medical Record Number: [id number] ?Patient Account Number: [id number] ?Date of Birth/Sex: ?Treating RN: ?09-21-1966 (56 y.o. F) [name], [name] ?Primary Care Manmeet Arzola: [name] ?Other Clinician: ?Referring Pro

## 2022-01-25 DIAGNOSIS — N2581 Secondary hyperparathyroidism of renal origin: Secondary | ICD-10-CM | POA: Diagnosis not present

## 2022-01-25 DIAGNOSIS — N186 End stage renal disease: Secondary | ICD-10-CM | POA: Diagnosis not present

## 2022-01-25 DIAGNOSIS — D631 Anemia in chronic kidney disease: Secondary | ICD-10-CM | POA: Diagnosis not present

## 2022-01-26 DIAGNOSIS — N186 End stage renal disease: Secondary | ICD-10-CM | POA: Diagnosis not present

## 2022-01-26 DIAGNOSIS — D631 Anemia in chronic kidney disease: Secondary | ICD-10-CM | POA: Diagnosis not present

## 2022-01-26 DIAGNOSIS — N2581 Secondary hyperparathyroidism of renal origin: Secondary | ICD-10-CM | POA: Diagnosis not present

## 2022-01-27 DIAGNOSIS — D631 Anemia in chronic kidney disease: Secondary | ICD-10-CM | POA: Diagnosis not present

## 2022-01-27 DIAGNOSIS — N2581 Secondary hyperparathyroidism of renal origin: Secondary | ICD-10-CM | POA: Diagnosis not present

## 2022-01-27 DIAGNOSIS — N186 End stage renal disease: Secondary | ICD-10-CM | POA: Diagnosis not present

## 2022-01-28 DIAGNOSIS — D631 Anemia in chronic kidney disease: Secondary | ICD-10-CM | POA: Diagnosis not present

## 2022-01-28 DIAGNOSIS — N186 End stage renal disease: Secondary | ICD-10-CM | POA: Diagnosis not present

## 2022-01-28 DIAGNOSIS — N2581 Secondary hyperparathyroidism of renal origin: Secondary | ICD-10-CM | POA: Diagnosis not present

## 2022-01-29 DIAGNOSIS — D631 Anemia in chronic kidney disease: Secondary | ICD-10-CM | POA: Diagnosis not present

## 2022-01-29 DIAGNOSIS — N186 End stage renal disease: Secondary | ICD-10-CM | POA: Diagnosis not present

## 2022-01-29 DIAGNOSIS — N2581 Secondary hyperparathyroidism of renal origin: Secondary | ICD-10-CM | POA: Diagnosis not present

## 2022-01-30 DIAGNOSIS — D631 Anemia in chronic kidney disease: Secondary | ICD-10-CM | POA: Diagnosis not present

## 2022-01-30 DIAGNOSIS — I5020 Unspecified systolic (congestive) heart failure: Secondary | ICD-10-CM | POA: Diagnosis not present

## 2022-01-30 DIAGNOSIS — I959 Hypotension, unspecified: Secondary | ICD-10-CM | POA: Diagnosis not present

## 2022-01-30 DIAGNOSIS — R627 Adult failure to thrive: Secondary | ICD-10-CM | POA: Diagnosis not present

## 2022-01-30 DIAGNOSIS — L89613 Pressure ulcer of right heel, stage 3: Secondary | ICD-10-CM | POA: Diagnosis not present

## 2022-01-30 DIAGNOSIS — N186 End stage renal disease: Secondary | ICD-10-CM | POA: Diagnosis not present

## 2022-01-30 DIAGNOSIS — N2581 Secondary hyperparathyroidism of renal origin: Secondary | ICD-10-CM | POA: Diagnosis not present

## 2022-01-31 DIAGNOSIS — N186 End stage renal disease: Secondary | ICD-10-CM | POA: Diagnosis not present

## 2022-01-31 DIAGNOSIS — N2581 Secondary hyperparathyroidism of renal origin: Secondary | ICD-10-CM | POA: Diagnosis not present

## 2022-01-31 DIAGNOSIS — D631 Anemia in chronic kidney disease: Secondary | ICD-10-CM | POA: Diagnosis not present

## 2022-02-01 DIAGNOSIS — N186 End stage renal disease: Secondary | ICD-10-CM | POA: Diagnosis not present

## 2022-02-01 DIAGNOSIS — N2581 Secondary hyperparathyroidism of renal origin: Secondary | ICD-10-CM | POA: Diagnosis not present

## 2022-02-01 DIAGNOSIS — D631 Anemia in chronic kidney disease: Secondary | ICD-10-CM | POA: Diagnosis not present

## 2022-02-02 DIAGNOSIS — D631 Anemia in chronic kidney disease: Secondary | ICD-10-CM | POA: Diagnosis not present

## 2022-02-02 DIAGNOSIS — N2581 Secondary hyperparathyroidism of renal origin: Secondary | ICD-10-CM | POA: Diagnosis not present

## 2022-02-02 DIAGNOSIS — N186 End stage renal disease: Secondary | ICD-10-CM | POA: Diagnosis not present

## 2022-02-03 DIAGNOSIS — D631 Anemia in chronic kidney disease: Secondary | ICD-10-CM | POA: Diagnosis not present

## 2022-02-03 DIAGNOSIS — N2581 Secondary hyperparathyroidism of renal origin: Secondary | ICD-10-CM | POA: Diagnosis not present

## 2022-02-03 DIAGNOSIS — N186 End stage renal disease: Secondary | ICD-10-CM | POA: Diagnosis not present

## 2022-02-04 DIAGNOSIS — N2581 Secondary hyperparathyroidism of renal origin: Secondary | ICD-10-CM | POA: Diagnosis not present

## 2022-02-04 DIAGNOSIS — N186 End stage renal disease: Secondary | ICD-10-CM | POA: Diagnosis not present

## 2022-02-04 DIAGNOSIS — D631 Anemia in chronic kidney disease: Secondary | ICD-10-CM | POA: Diagnosis not present

## 2022-02-05 DIAGNOSIS — N186 End stage renal disease: Secondary | ICD-10-CM | POA: Diagnosis not present

## 2022-02-05 DIAGNOSIS — N2581 Secondary hyperparathyroidism of renal origin: Secondary | ICD-10-CM | POA: Diagnosis not present

## 2022-02-05 DIAGNOSIS — D631 Anemia in chronic kidney disease: Secondary | ICD-10-CM | POA: Diagnosis not present

## 2022-02-06 DIAGNOSIS — N186 End stage renal disease: Secondary | ICD-10-CM | POA: Diagnosis not present

## 2022-02-06 DIAGNOSIS — D631 Anemia in chronic kidney disease: Secondary | ICD-10-CM | POA: Diagnosis not present

## 2022-02-06 DIAGNOSIS — N2581 Secondary hyperparathyroidism of renal origin: Secondary | ICD-10-CM | POA: Diagnosis not present

## 2022-02-07 ENCOUNTER — Encounter (HOSPITAL_BASED_OUTPATIENT_CLINIC_OR_DEPARTMENT_OTHER): Payer: Medicare Other | Admitting: Internal Medicine

## 2022-02-07 DIAGNOSIS — L89610 Pressure ulcer of right heel, unstageable: Secondary | ICD-10-CM

## 2022-02-07 DIAGNOSIS — N2581 Secondary hyperparathyroidism of renal origin: Secondary | ICD-10-CM | POA: Diagnosis not present

## 2022-02-07 DIAGNOSIS — L84 Corns and callosities: Secondary | ICD-10-CM

## 2022-02-07 DIAGNOSIS — L89613 Pressure ulcer of right heel, stage 3: Secondary | ICD-10-CM

## 2022-02-07 DIAGNOSIS — I959 Hypotension, unspecified: Secondary | ICD-10-CM | POA: Diagnosis not present

## 2022-02-07 DIAGNOSIS — I5020 Unspecified systolic (congestive) heart failure: Secondary | ICD-10-CM | POA: Diagnosis not present

## 2022-02-07 DIAGNOSIS — F259 Schizoaffective disorder, unspecified: Secondary | ICD-10-CM | POA: Diagnosis not present

## 2022-02-07 DIAGNOSIS — R627 Adult failure to thrive: Secondary | ICD-10-CM | POA: Diagnosis not present

## 2022-02-07 DIAGNOSIS — D631 Anemia in chronic kidney disease: Secondary | ICD-10-CM | POA: Diagnosis not present

## 2022-02-07 DIAGNOSIS — Z992 Dependence on renal dialysis: Secondary | ICD-10-CM | POA: Diagnosis not present

## 2022-02-07 DIAGNOSIS — I509 Heart failure, unspecified: Secondary | ICD-10-CM | POA: Diagnosis not present

## 2022-02-07 DIAGNOSIS — N186 End stage renal disease: Secondary | ICD-10-CM | POA: Diagnosis not present

## 2022-02-07 NOTE — Progress Notes (Signed)
Jane [name], DROLET ([id number]) ?Visit Report for 02/07/2022 ?Arrival Information Details ?Patient Name: Date of Service: ?Jane [name], Takeela M. 02/07/2022 8:15 A M ?Medical Record Number: [id number] ?Patient Account Number: [id number] ?Date of Birth/Sex: Treating RN: ?[birth year] (56 y.o. F) [name], [name] ?Primary Care Dewie Ahart: [name] Other Clinician: ?Referring Erabella Kuipers: ?Treating Earlin Sweeden/Extender: [name], [name] ?[name]: 20 ?Visit Information History Since Last Visit ?Added or deleted any medications: No ?Patient Arrived: Wheel Chair ?Any new allergies or adverse reactions: No ?Arrival Time: 07:53 ?Had a fall or experienced change in No ?Accompanied By: brother in law ?activities of daily living that may affect ?Transfer Assistance: Manual ?risk of falls: ?Patient Identification Verified: Yes ?Signs or symptoms of abuse/neglect since last visito No ?Secondary Verification Process Completed: Yes ?Hospitalized since last visit: No ?Patient Requires Transmission-Based Precautions: No ?Implantable device outside of the clinic excluding No ?Patient Has Alerts: Yes ?cellular tissue based products placed in the center ?Patient Alerts: R ABI=Non Comp since last visit: ?Has Dressing in Place as Prescribed: Yes ?Pain Present Now: No ?Electronic Signature(s) ?Signed: 02/07/2022 5:03:45 PM By: Deon Pilling RN, BSN ?Entered By: Deon Pilling on 02/07/2022 08:03:52 ?-------------------------------------------------------------------------------- ?Clinic Level of Care Assessment Details ?Patient Name: Date of Service: ?Jane [name], Jane M. 02/07/2022 8:15 A M ?Medical Record Number: [id number] ?Patient Account Number: [id number] ?Date of Birth/Sex: Treating RN: ?10-10-1966 (56 y.o. F) [name], [name] ?Primary Care Deina Lipsey: [name] Other Clinician: ?Referring Rozell Theiler: ?Treating Rahkim Rabalais/Extender: [name], [name] ?[name]: 20 ?Clinic Level of Care  Assessment Items ?TOOL 4 Quantity Score ?X- 1 0 ?Use when only an EandM is performed on FOLLOW-UP visit ?ASSESSMENTS - Nursing Assessment / Reassessment ?X- 1 10 ?Reassessment of Co-morbidities (includes updates in patient status) ?X- 1 5 ?Reassessment of Adherence to Treatment Plan ?ASSESSMENTS - Wound and Skin A ssessment / Reassessment ?X - Simple Wound Assessment / Reassessment - one wound 1 5 ?'[]'$  - 0 ?Complex Wound Assessment / Reassessment - multiple wounds ?X- 1 10 ?Dermatologic / Skin Assessment (not related to wound area) ?ASSESSMENTS - Focused Assessment ?X- 1 5 ?Circumferential Edema Measurements - multi extremities ?'[]'$  - 0 ?Nutritional Assessment / Counseling / Intervention ?'[]'$  - 0 ?Lower Extremity Assessment (monofilament, tuning fork, pulses) ?'[]'$  - 0 ?Peripheral Arterial Disease Assessment (using hand held doppler) ?ASSESSMENTS - Ostomy and/or Continence Assessment and Care ?'[]'$  - 0 ?Incontinence Assessment and Management ?'[]'$  - 0 ?Ostomy Care Assessment and Management (repouching, etc.) ?PROCESS - Coordination of Care ?X - Simple Patient / Family Education for ongoing care 1 15 ?'[]'$  - 0 ?Complex (extensive) Patient / Family Education for ongoing care ?X- 1 10 ?Staff obtains Consents, Records, T Results / Process Orders ?est ?'[]'$  - 0 ?Staff telephones HHA, Nursing Homes / Clarify orders / etc ?'[]'$  - 0 ?Routine Transfer to another Facility (non-emergent condition) ?'[]'$  - 0 ?Routine Hospital Admission (non-emergent condition) ?'[]'$  - 0 ?New Admissions / Biomedical engineer / Ordering NPWT Apligraf, etc. ?, ?'[]'$  - 0 ?Emergency Hospital Admission (emergent condition) ?X- 1 10 ?Simple Discharge Coordination ?'[]'$  - 0 ?Complex (extensive) Discharge Coordination ?PROCESS - Special Needs ?'[]'$  - 0 ?Pediatric / Minor Patient Management ?'[]'$  - 0 ?Isolation Patient Management ?'[]'$  - 0 ?Hearing / Language / Visual special needs ?'[]'$  - 0 ?Assessment of Community assistance (transportation, D/C planning, etc.) ?'[]'$  -  0 ?Additional assistance / Altered mentation ?'[]'$  - 0 ?Support Surface(s) Assessment (bed, cushion, seat, etc.) ?INTERVENTIONS - Wound Cleansing / Measurement ?X - Simple Wound Cleansing - one  wound 1 5 ?'[]'$  - 0 ?Complex Wound Cleansing - multiple wounds ?X- 1 5 ?Wound Imaging (photographs - any number of wounds) ?'[]'$  - 0 ?Wound Tracing (instead of photographs) ?X- 1 5 ?Simple Wound Measurement - one wound ?'[]'$  - 0 ?Complex Wound Measurement - multiple wounds ?INTERVENTIONS - Wound Dressings ?X - Small Wound Dressing one or multiple wounds 1 10 ?'[]'$  - 0 ?Medium Wound Dressing one or multiple wounds ?'[]'$  - 0 ?Large Wound Dressing one or multiple wounds ?'[]'$  - 0 ?Application of Medications - topical ?'[]'$  - 0 ?Application of Medications - injection ?INTERVENTIONS - Miscellaneous ?'[]'$  - 0 ?External ear exam ?'[]'$  - 0 ?Specimen Collection (cultures, biopsies, blood, body fluids, etc.) ?'[]'$  - 0 ?Specimen(s) / Culture(s) sent or taken to Lab for analysis ?'[]'$  - 0 ?Patient Transfer (multiple staff / Civil Service fast streamer / Similar devices) ?'[]'$  - 0 ?Simple Staple / Suture removal (25 or less) ?'[]'$  - 0 ?Complex Staple / Suture removal (26 or more) ?'[]'$  - 0 ?Hypo / Hyperglycemic Management (close monitor of Blood Glucose) ?'[]'$  - 0 ?Ankle / Brachial Index (ABI) - do not check if billed separately ?X- 1 5 ?Vital Signs ?Has the patient been seen at the hospital within the last three years: Yes ?Total Score: 100 ?Level Of Care: New/Established - Level 3 ?Electronic Signature(s) ?Signed: 02/07/2022 5:03:45 PM By: Deon Pilling RN, BSN ?Entered By: Deon Pilling on 02/07/2022 08:42:49 ?-------------------------------------------------------------------------------- ?Encounter Discharge Information Details ?Patient Name: Date of Service: ?Jane [name], Jane M. 02/07/2022 8:15 A M ?Medical Record Number: [id number] ?Patient Account Number: [id number] ?Date of Birth/Sex: Treating RN: ?1966/10/09 (56 y.o. F) [name], [name] ?Primary Care Mizani Dilday: [name]  Other Clinician: ?Referring Atari Novick: ?Treating Jaydalynn Olivero/Extender: [name], [name] ?[name]: 20 ?Encounter Discharge Information Items ?Discharge Condition: Stable ?Ambulatory Status: Wheelchair ?Discharge Destination: Home ?Transportation: Private Auto ?Accompanied By: brother in law ?Schedule Follow-up Appointment: No ?Clinical Summary of Care: ?Notes ?lotion, heel cup and kerlix for protection to heel per Damiano Stamper. ?Electronic Signature(s) ?Signed: 02/07/2022 5:03:45 PM By: Deon Pilling RN, BSN ?Entered By: Deon Pilling on 02/07/2022 08:44:50 ?-------------------------------------------------------------------------------- ?Lower Extremity Assessment Details ?Patient Name: Date of Service: ?Jane [name], Jane M. 02/07/2022 8:15 A M ?Medical Record Number: [id number] ?Patient Account Number: [id number] ?Date of Birth/Sex: Treating RN: ?[birth year] (56 y.o. F) [name], [name] ?Primary Care Jomayra Novitsky: [name] Other Clinician: ?Referring Mirinda Monte: ?Treating Nicodemus Denk/Extender: [name], [name] ?[name]: 20 ?Edema Assessment ?Assessed: [Left: No] [Right: Yes] ?Edema: [Left: Ye] [Right: s] ?Calf ?Left: Right: ?Point of Measurement: 30 cm From Medial Instep 35 cm ?Ankle ?Left: Right: ?Point of Measurement: 7 cm From Medial Instep 23 cm ?Vascular Assessment ?Pulses: ?Dorsalis Pedis ?Palpable: [Right:Yes] ?Electronic Signature(s) ?Signed: 02/07/2022 5:03:45 PM By: Deon Pilling RN, BSN ?Entered By: Deon Pilling on 02/07/2022 08:04:33 ?-------------------------------------------------------------------------------- ?Multi Wound Chart Details ?Patient Name: ?Date of Service: ?Jane [name], Jane M. 02/07/2022 8:15 A M ?Medical Record Number: [id number] ?Patient Account Number: [id number] ?Date of Birth/Sex: ?Treating RN: ?1966-03-20 (56 y.o. F) [name], [name] ?Primary Care Tejuan Gholson: [name] ?Other Clinician: ?Referring Francesco Provencal: ?Treating  Lunette Tapp/Extender: [name], [name] ?[name]: 20 ?Vital Signs ?Height(in): 65 ?Pulse(bpm): 58 ?Weight(lbs): 130 ?Blood Pressure(mmHg): 163/99 ?Body Mass Index(BMI): 21.6 ?Temperature(??F): 97.8 ?Res

## 2022-02-08 DIAGNOSIS — D631 Anemia in chronic kidney disease: Secondary | ICD-10-CM | POA: Diagnosis not present

## 2022-02-08 DIAGNOSIS — N186 End stage renal disease: Secondary | ICD-10-CM | POA: Diagnosis not present

## 2022-02-08 DIAGNOSIS — N2581 Secondary hyperparathyroidism of renal origin: Secondary | ICD-10-CM | POA: Diagnosis not present

## 2022-02-08 NOTE — Progress Notes (Signed)
LAREN, ORAMA ([id number]) ?Visit Report for 02/07/2022 ?Chief Complaint Document Details ?Patient Name: Date of Service: ?[name], Lanaysia M. 02/07/2022 8:15 A M ?Medical Record Number: [id number] ?Patient Account Number: [id number] ?Date of Birth/Sex: Treating RN: ?[birth year] (56 y.o. F) [name], [name] ?Primary Care Provider: [name] Other Clinician: ?Referring Provider: ?Treating Provider/Extender: [name], [name] ?[name]: 20 ?Information Obtained from: Patient ?Chief Complaint ?Right heel wound ?Electronic Signature(s) ?Signed: 02/07/2022 10:57:35 AM By: [name] DO ?Entered By: [name] on 02/07/2022 10:13:43 ?-------------------------------------------------------------------------------- ?HPI Details ?Patient Name: Date of Service: ?[name], Stevie M. 02/07/2022 8:15 A M ?Medical Record Number: [id number] ?Patient Account Number: [id number] ?Date of Birth/Sex: Treating RN: ?[birth year] (56 y.o. F) [name], [name] ?Primary Care Provider: [name] Other Clinician: ?Referring Provider: ?Treating Provider/Extender: [name], [name] ?[name]: 20 ?History of Present Illness ?HPI Description: Admission 09/19/2021 ?Ms. Jeana Kersting is a 56 year old female with a past medical history of schizoaffective disorder, end-stage renal disease on peritoneal dialysis and seizure ?disorder that presents to the clinic for a right heel wound that has been present since her hospitalization in April 2022 because of an epidural abscess that ?required surgery. During her hospitalization she developed the heel wound and a breast wound. She would lean more aggressively to the left side for long ?periods of time. The breast wound has healed. The right heel Has developed a loose callus. She is not mobile and is either in her wheelchair or in the bed most ?of the day. She currently denies pain, increased warmth or erythema to the area or  drainage. Son-in-law is present and helps with the history. Patient is unable ?to participate fully in her care due to her diagnosis of schizoaffective disorder. ?10/03/2021; patient presents for follow-up. She has developed a new pressure injury to the calcaneus and has skin breakdown to the surrounding area. She has ?been using Hydrofera Blue to the previous wound site. She has no issues or complaints today. She denies signs of infection. ?12/19; patient presents for follow-up. She has been using Hydrofera Blue to the wound sites. She went back and obtained TBI's. She currently denies signs of ?infection. ?1/3; patient presents for follow-up. She has been using Hydrofera Blue to the medial wound and Santyl to the calcaneus wound. She has no issues or ?complaints today. She denies signs of infection. ?1/17; patient presents for follow-up. She has been using Hydrofera Blue to the medial wound along with Santyl. She been using Santyl to the calcaneus wound. ?She has no issues or complaints today. She denies signs of infection. ?1/31; patient presents for follow-up. The medial wound remains closed. She has been using Santyl to the calcaneus wound. She has no issues or complaints ?today. She denies signs of infection. ?2/7; patient presents for follow-up. She has been using Santyl to the calcaneus wound. The medial wound remains closed but does have callus surrounding this. ?She is wearing her Prevalon boots however takes these off pretty regularly. She currently denies signs of infection. ?2/21; patient presents for follow-up. She continues to use Santyl to the calcaneus wound. She is unable to offload the right heel. Son states that the patient is ?often taking off the Prevalon boots and putting pressure on the heel. She currently denies signs of infection. ?2/28; patient presents for follow-up. She continues to use Santyl to the wound bed. She reports taking Augmentin with no issues. She denies systemic signs  of ?infection. It is unclear if she can truly offload  this wound. Her CT scan has not been scheduled yet. ?12/29/2021: CT scan was performed on March 7. The radiologist interpreted this as follows: "There is thinning of the posterolateral heel soft tissues coming within ?4 mm of the posterolateral calcaneus. There was no definitive cortical erosion to suggest acute osteomyelitis." Her course of Augmentin had been extended, ?but apparently there was some confusion at the pharmacy and they did not dispense this. The intake nurse contacted the pharmacy and they will provide the ?prescription, but this means she has not had any antibiotics since Saturday. She continues to be in [place] and silver alginate. Her son is with her today and ?reports that she does have Prevalon boots, but that she often slides her foot around inside the boot. ?3/23; patient presents for follow-up. She completed her course of Augmentin. She reports improvement in wound healing. She currently denies systemic signs ?of infection. ?3/28; patient presents for follow-up. She has no issues or complaints. The wound culture has not resulted. She denies signs of systemic infection. She ?continues to use Santyl and silver alginate with dressing changes. ?4/4; patient presents for follow-up. She has no issues or complaints today. She has been using Santyl and silver alginate to the wound. She had a PCR culture ?done on 3/23 that showed no growth. She currently denies signs of infection. ?4/18; patient presents for follow-up. She has no issues or complaints today. She reports that the wound has callused over and she has noted no drainage for ?the past week. ?Electronic Signature(s) ?Signed: 02/07/2022 10:57:35 AM By: [name] DO ?Entered By: [name] on 02/07/2022 10:14:48 ?-------------------------------------------------------------------------------- ?Paring/cutting of benign hyperkeratotic lesion 2-4 Details ?Patient Name: Date of  Service: ?[name], Siennah M. 02/07/2022 8:15 A M ?Medical Record Number: [id number] ?Patient Account Number: [id number] ?Date of Birth/Sex: Treating RN: ?September 03, 1966 (56 y.o. F) [name], [name] ?Primary Care Provider: [name] Other Clinician: ?Referring Provider: ?Treating Provider/Extender: [name], [name] ?[name]: 20 ?Procedure Performed for: Non-Wound Location ?Performed By: Physician [name], DO ?Post Procedure Diagnosis ?Same as Pre-procedure ?Notes ?used a 3 curette to pair callous. ?Electronic Signature(s) ?Signed: 02/07/2022 10:57:35 AM By: [name] DO ?Signed: 02/07/2022 5:03:45 PM By: Deon Pilling RN, BSN ?Entered By: Deon Pilling on 02/07/2022 08:35:58 ?-------------------------------------------------------------------------------- ?Physical Exam Details ?Patient Name: Date of Service: ?[name], Jazzmyn M. 02/07/2022 8:15 A M ?Medical Record Number: [id number] ?Patient Account Number: [id number] ?Date of Birth/Sex: Treating RN: ?1966/02/17 (56 y.o. F) [name], [name] ?Primary Care Provider: [name] Other Clinician: ?Referring Provider: ?Treating Provider/Extender: [name], [name] ?[name]: 20 ?Constitutional ?respirations regular, non-labored and within target range for patient.Marland Kitchen ?Cardiovascular ?2+ dorsalis pedis/posterior tibialis pulses. ?Psychiatric ?pleasant and cooperative. ?Notes ?Right foot: T the heel there is callus to the previous wound site. Postdebridement the area appears healed. ?o ?Electronic Signature(s) ?Signed: 02/07/2022 10:57:35 AM By: [name] DO ?Entered By: [name] on 02/07/2022 10:15:58 ?-------------------------------------------------------------------------------- ?Physician Orders Details ?Patient Name: Date of Service: ?[name], Kasaundra M. 02/07/2022 8:15 A M ?Medical Record Number: [id number] ?Patient Account Number: [id number] ?Date of Birth/Sex: Treating  RN: ?1966-02-18 (56 y.o. F) [name], [name] ?Primary Care Provider: [name] Other Clinician: ?Referring Provider: ?Treating Provider/Extender: [name], [name] ?[name]: 20 ?Verbal / Ph

## 2022-02-09 DIAGNOSIS — D631 Anemia in chronic kidney disease: Secondary | ICD-10-CM | POA: Diagnosis not present

## 2022-02-09 DIAGNOSIS — N2581 Secondary hyperparathyroidism of renal origin: Secondary | ICD-10-CM | POA: Diagnosis not present

## 2022-02-09 DIAGNOSIS — N186 End stage renal disease: Secondary | ICD-10-CM | POA: Diagnosis not present

## 2022-02-10 DIAGNOSIS — N2581 Secondary hyperparathyroidism of renal origin: Secondary | ICD-10-CM | POA: Diagnosis not present

## 2022-02-10 DIAGNOSIS — N186 End stage renal disease: Secondary | ICD-10-CM | POA: Diagnosis not present

## 2022-02-10 DIAGNOSIS — D631 Anemia in chronic kidney disease: Secondary | ICD-10-CM | POA: Diagnosis not present

## 2022-02-11 DIAGNOSIS — Z993 Dependence on wheelchair: Secondary | ICD-10-CM | POA: Diagnosis not present

## 2022-02-11 DIAGNOSIS — D631 Anemia in chronic kidney disease: Secondary | ICD-10-CM | POA: Diagnosis not present

## 2022-02-11 DIAGNOSIS — I5020 Unspecified systolic (congestive) heart failure: Secondary | ICD-10-CM | POA: Diagnosis not present

## 2022-02-11 DIAGNOSIS — E440 Moderate protein-calorie malnutrition: Secondary | ICD-10-CM | POA: Diagnosis not present

## 2022-02-11 DIAGNOSIS — Z8701 Personal history of pneumonia (recurrent): Secondary | ICD-10-CM | POA: Diagnosis not present

## 2022-02-11 DIAGNOSIS — Z992 Dependence on renal dialysis: Secondary | ICD-10-CM | POA: Diagnosis not present

## 2022-02-11 DIAGNOSIS — I959 Hypotension, unspecified: Secondary | ICD-10-CM | POA: Diagnosis not present

## 2022-02-11 DIAGNOSIS — I700 Atherosclerosis of aorta: Secondary | ICD-10-CM | POA: Diagnosis not present

## 2022-02-11 DIAGNOSIS — N186 End stage renal disease: Secondary | ICD-10-CM | POA: Diagnosis not present

## 2022-02-11 DIAGNOSIS — Z9181 History of falling: Secondary | ICD-10-CM | POA: Diagnosis not present

## 2022-02-11 DIAGNOSIS — N2581 Secondary hyperparathyroidism of renal origin: Secondary | ICD-10-CM | POA: Diagnosis not present

## 2022-02-11 DIAGNOSIS — R188 Other ascites: Secondary | ICD-10-CM | POA: Diagnosis not present

## 2022-02-11 DIAGNOSIS — L89613 Pressure ulcer of right heel, stage 3: Secondary | ICD-10-CM | POA: Diagnosis not present

## 2022-02-11 DIAGNOSIS — K8010 Calculus of gallbladder with chronic cholecystitis without obstruction: Secondary | ICD-10-CM | POA: Diagnosis not present

## 2022-02-11 DIAGNOSIS — G40909 Epilepsy, unspecified, not intractable, without status epilepticus: Secondary | ICD-10-CM | POA: Diagnosis not present

## 2022-02-11 DIAGNOSIS — F259 Schizoaffective disorder, unspecified: Secondary | ICD-10-CM | POA: Diagnosis not present

## 2022-02-11 DIAGNOSIS — R627 Adult failure to thrive: Secondary | ICD-10-CM | POA: Diagnosis not present

## 2022-02-12 DIAGNOSIS — N186 End stage renal disease: Secondary | ICD-10-CM | POA: Diagnosis not present

## 2022-02-12 DIAGNOSIS — D631 Anemia in chronic kidney disease: Secondary | ICD-10-CM | POA: Diagnosis not present

## 2022-02-12 DIAGNOSIS — N2581 Secondary hyperparathyroidism of renal origin: Secondary | ICD-10-CM | POA: Diagnosis not present

## 2022-02-13 DIAGNOSIS — N2581 Secondary hyperparathyroidism of renal origin: Secondary | ICD-10-CM | POA: Diagnosis not present

## 2022-02-13 DIAGNOSIS — D631 Anemia in chronic kidney disease: Secondary | ICD-10-CM | POA: Diagnosis not present

## 2022-02-13 DIAGNOSIS — N186 End stage renal disease: Secondary | ICD-10-CM | POA: Diagnosis not present

## 2022-02-14 DIAGNOSIS — D631 Anemia in chronic kidney disease: Secondary | ICD-10-CM | POA: Diagnosis not present

## 2022-02-14 DIAGNOSIS — N2581 Secondary hyperparathyroidism of renal origin: Secondary | ICD-10-CM | POA: Diagnosis not present

## 2022-02-14 DIAGNOSIS — N186 End stage renal disease: Secondary | ICD-10-CM | POA: Diagnosis not present

## 2022-02-15 DIAGNOSIS — N186 End stage renal disease: Secondary | ICD-10-CM | POA: Diagnosis not present

## 2022-02-15 DIAGNOSIS — N2581 Secondary hyperparathyroidism of renal origin: Secondary | ICD-10-CM | POA: Diagnosis not present

## 2022-02-15 DIAGNOSIS — D631 Anemia in chronic kidney disease: Secondary | ICD-10-CM | POA: Diagnosis not present

## 2022-02-16 DIAGNOSIS — N2581 Secondary hyperparathyroidism of renal origin: Secondary | ICD-10-CM | POA: Diagnosis not present

## 2022-02-16 DIAGNOSIS — I959 Hypotension, unspecified: Secondary | ICD-10-CM | POA: Diagnosis not present

## 2022-02-16 DIAGNOSIS — I5020 Unspecified systolic (congestive) heart failure: Secondary | ICD-10-CM | POA: Diagnosis not present

## 2022-02-16 DIAGNOSIS — N186 End stage renal disease: Secondary | ICD-10-CM | POA: Diagnosis not present

## 2022-02-16 DIAGNOSIS — D631 Anemia in chronic kidney disease: Secondary | ICD-10-CM | POA: Diagnosis not present

## 2022-02-16 DIAGNOSIS — R627 Adult failure to thrive: Secondary | ICD-10-CM | POA: Diagnosis not present

## 2022-02-16 DIAGNOSIS — L89613 Pressure ulcer of right heel, stage 3: Secondary | ICD-10-CM | POA: Diagnosis not present

## 2022-02-17 DIAGNOSIS — D631 Anemia in chronic kidney disease: Secondary | ICD-10-CM | POA: Diagnosis not present

## 2022-02-17 DIAGNOSIS — N186 End stage renal disease: Secondary | ICD-10-CM | POA: Diagnosis not present

## 2022-02-17 DIAGNOSIS — N2581 Secondary hyperparathyroidism of renal origin: Secondary | ICD-10-CM | POA: Diagnosis not present

## 2022-02-18 DIAGNOSIS — N2581 Secondary hyperparathyroidism of renal origin: Secondary | ICD-10-CM | POA: Diagnosis not present

## 2022-02-18 DIAGNOSIS — D631 Anemia in chronic kidney disease: Secondary | ICD-10-CM | POA: Diagnosis not present

## 2022-02-18 DIAGNOSIS — N186 End stage renal disease: Secondary | ICD-10-CM | POA: Diagnosis not present

## 2022-02-19 DIAGNOSIS — Z992 Dependence on renal dialysis: Secondary | ICD-10-CM | POA: Diagnosis not present

## 2022-02-19 DIAGNOSIS — N2581 Secondary hyperparathyroidism of renal origin: Secondary | ICD-10-CM | POA: Diagnosis not present

## 2022-02-19 DIAGNOSIS — D631 Anemia in chronic kidney disease: Secondary | ICD-10-CM | POA: Diagnosis not present

## 2022-02-19 DIAGNOSIS — N186 End stage renal disease: Secondary | ICD-10-CM | POA: Diagnosis not present

## 2022-02-20 DIAGNOSIS — N186 End stage renal disease: Secondary | ICD-10-CM | POA: Diagnosis not present

## 2022-02-20 DIAGNOSIS — N2581 Secondary hyperparathyroidism of renal origin: Secondary | ICD-10-CM | POA: Diagnosis not present

## 2022-02-20 DIAGNOSIS — D631 Anemia in chronic kidney disease: Secondary | ICD-10-CM | POA: Diagnosis not present

## 2022-02-21 DIAGNOSIS — L89613 Pressure ulcer of right heel, stage 3: Secondary | ICD-10-CM | POA: Diagnosis not present

## 2022-02-21 DIAGNOSIS — I959 Hypotension, unspecified: Secondary | ICD-10-CM | POA: Diagnosis not present

## 2022-02-21 DIAGNOSIS — D631 Anemia in chronic kidney disease: Secondary | ICD-10-CM | POA: Diagnosis not present

## 2022-02-21 DIAGNOSIS — N2581 Secondary hyperparathyroidism of renal origin: Secondary | ICD-10-CM | POA: Diagnosis not present

## 2022-02-21 DIAGNOSIS — I5020 Unspecified systolic (congestive) heart failure: Secondary | ICD-10-CM | POA: Diagnosis not present

## 2022-02-21 DIAGNOSIS — N186 End stage renal disease: Secondary | ICD-10-CM | POA: Diagnosis not present

## 2022-02-21 DIAGNOSIS — R627 Adult failure to thrive: Secondary | ICD-10-CM | POA: Diagnosis not present

## 2022-02-22 DIAGNOSIS — N2581 Secondary hyperparathyroidism of renal origin: Secondary | ICD-10-CM | POA: Diagnosis not present

## 2022-02-22 DIAGNOSIS — D631 Anemia in chronic kidney disease: Secondary | ICD-10-CM | POA: Diagnosis not present

## 2022-02-22 DIAGNOSIS — N186 End stage renal disease: Secondary | ICD-10-CM | POA: Diagnosis not present

## 2022-02-23 DIAGNOSIS — N2581 Secondary hyperparathyroidism of renal origin: Secondary | ICD-10-CM | POA: Diagnosis not present

## 2022-02-23 DIAGNOSIS — N186 End stage renal disease: Secondary | ICD-10-CM | POA: Diagnosis not present

## 2022-02-23 DIAGNOSIS — D631 Anemia in chronic kidney disease: Secondary | ICD-10-CM | POA: Diagnosis not present

## 2022-02-24 DIAGNOSIS — N2581 Secondary hyperparathyroidism of renal origin: Secondary | ICD-10-CM | POA: Diagnosis not present

## 2022-02-24 DIAGNOSIS — N186 End stage renal disease: Secondary | ICD-10-CM | POA: Diagnosis not present

## 2022-02-24 DIAGNOSIS — D631 Anemia in chronic kidney disease: Secondary | ICD-10-CM | POA: Diagnosis not present

## 2022-02-25 DIAGNOSIS — D631 Anemia in chronic kidney disease: Secondary | ICD-10-CM | POA: Diagnosis not present

## 2022-02-25 DIAGNOSIS — N186 End stage renal disease: Secondary | ICD-10-CM | POA: Diagnosis not present

## 2022-02-25 DIAGNOSIS — N2581 Secondary hyperparathyroidism of renal origin: Secondary | ICD-10-CM | POA: Diagnosis not present

## 2022-02-26 DIAGNOSIS — N2581 Secondary hyperparathyroidism of renal origin: Secondary | ICD-10-CM | POA: Diagnosis not present

## 2022-02-26 DIAGNOSIS — D631 Anemia in chronic kidney disease: Secondary | ICD-10-CM | POA: Diagnosis not present

## 2022-02-26 DIAGNOSIS — N186 End stage renal disease: Secondary | ICD-10-CM | POA: Diagnosis not present

## 2022-02-27 DIAGNOSIS — N2581 Secondary hyperparathyroidism of renal origin: Secondary | ICD-10-CM | POA: Diagnosis not present

## 2022-02-27 DIAGNOSIS — D631 Anemia in chronic kidney disease: Secondary | ICD-10-CM | POA: Diagnosis not present

## 2022-02-27 DIAGNOSIS — N186 End stage renal disease: Secondary | ICD-10-CM | POA: Diagnosis not present

## 2022-02-28 DIAGNOSIS — D631 Anemia in chronic kidney disease: Secondary | ICD-10-CM | POA: Diagnosis not present

## 2022-02-28 DIAGNOSIS — L89613 Pressure ulcer of right heel, stage 3: Secondary | ICD-10-CM | POA: Diagnosis not present

## 2022-02-28 DIAGNOSIS — N2581 Secondary hyperparathyroidism of renal origin: Secondary | ICD-10-CM | POA: Diagnosis not present

## 2022-02-28 DIAGNOSIS — I5020 Unspecified systolic (congestive) heart failure: Secondary | ICD-10-CM | POA: Diagnosis not present

## 2022-02-28 DIAGNOSIS — R627 Adult failure to thrive: Secondary | ICD-10-CM | POA: Diagnosis not present

## 2022-02-28 DIAGNOSIS — N186 End stage renal disease: Secondary | ICD-10-CM | POA: Diagnosis not present

## 2022-02-28 DIAGNOSIS — I959 Hypotension, unspecified: Secondary | ICD-10-CM | POA: Diagnosis not present

## 2022-03-01 DIAGNOSIS — N2581 Secondary hyperparathyroidism of renal origin: Secondary | ICD-10-CM | POA: Diagnosis not present

## 2022-03-01 DIAGNOSIS — N186 End stage renal disease: Secondary | ICD-10-CM | POA: Diagnosis not present

## 2022-03-01 DIAGNOSIS — D631 Anemia in chronic kidney disease: Secondary | ICD-10-CM | POA: Diagnosis not present

## 2022-03-02 DIAGNOSIS — D631 Anemia in chronic kidney disease: Secondary | ICD-10-CM | POA: Diagnosis not present

## 2022-03-02 DIAGNOSIS — N186 End stage renal disease: Secondary | ICD-10-CM | POA: Diagnosis not present

## 2022-03-02 DIAGNOSIS — N2581 Secondary hyperparathyroidism of renal origin: Secondary | ICD-10-CM | POA: Diagnosis not present

## 2022-03-03 DIAGNOSIS — N186 End stage renal disease: Secondary | ICD-10-CM | POA: Diagnosis not present

## 2022-03-03 DIAGNOSIS — D631 Anemia in chronic kidney disease: Secondary | ICD-10-CM | POA: Diagnosis not present

## 2022-03-03 DIAGNOSIS — N2581 Secondary hyperparathyroidism of renal origin: Secondary | ICD-10-CM | POA: Diagnosis not present

## 2022-03-04 DIAGNOSIS — N186 End stage renal disease: Secondary | ICD-10-CM | POA: Diagnosis not present

## 2022-03-04 DIAGNOSIS — D631 Anemia in chronic kidney disease: Secondary | ICD-10-CM | POA: Diagnosis not present

## 2022-03-04 DIAGNOSIS — N2581 Secondary hyperparathyroidism of renal origin: Secondary | ICD-10-CM | POA: Diagnosis not present

## 2022-03-05 DIAGNOSIS — N186 End stage renal disease: Secondary | ICD-10-CM | POA: Diagnosis not present

## 2022-03-05 DIAGNOSIS — N2581 Secondary hyperparathyroidism of renal origin: Secondary | ICD-10-CM | POA: Diagnosis not present

## 2022-03-05 DIAGNOSIS — D631 Anemia in chronic kidney disease: Secondary | ICD-10-CM | POA: Diagnosis not present

## 2022-03-06 DIAGNOSIS — N2581 Secondary hyperparathyroidism of renal origin: Secondary | ICD-10-CM | POA: Diagnosis not present

## 2022-03-06 DIAGNOSIS — N186 End stage renal disease: Secondary | ICD-10-CM | POA: Diagnosis not present

## 2022-03-06 DIAGNOSIS — D631 Anemia in chronic kidney disease: Secondary | ICD-10-CM | POA: Diagnosis not present

## 2022-03-07 DIAGNOSIS — D631 Anemia in chronic kidney disease: Secondary | ICD-10-CM | POA: Diagnosis not present

## 2022-03-07 DIAGNOSIS — N2581 Secondary hyperparathyroidism of renal origin: Secondary | ICD-10-CM | POA: Diagnosis not present

## 2022-03-07 DIAGNOSIS — N186 End stage renal disease: Secondary | ICD-10-CM | POA: Diagnosis not present

## 2022-03-08 DIAGNOSIS — D631 Anemia in chronic kidney disease: Secondary | ICD-10-CM | POA: Diagnosis not present

## 2022-03-08 DIAGNOSIS — N2581 Secondary hyperparathyroidism of renal origin: Secondary | ICD-10-CM | POA: Diagnosis not present

## 2022-03-08 DIAGNOSIS — N186 End stage renal disease: Secondary | ICD-10-CM | POA: Diagnosis not present

## 2022-03-09 DIAGNOSIS — N2581 Secondary hyperparathyroidism of renal origin: Secondary | ICD-10-CM | POA: Diagnosis not present

## 2022-03-09 DIAGNOSIS — N186 End stage renal disease: Secondary | ICD-10-CM | POA: Diagnosis not present

## 2022-03-09 DIAGNOSIS — D631 Anemia in chronic kidney disease: Secondary | ICD-10-CM | POA: Diagnosis not present

## 2022-03-10 DIAGNOSIS — D631 Anemia in chronic kidney disease: Secondary | ICD-10-CM | POA: Diagnosis not present

## 2022-03-10 DIAGNOSIS — N186 End stage renal disease: Secondary | ICD-10-CM | POA: Diagnosis not present

## 2022-03-10 DIAGNOSIS — N2581 Secondary hyperparathyroidism of renal origin: Secondary | ICD-10-CM | POA: Diagnosis not present

## 2022-03-11 DIAGNOSIS — D631 Anemia in chronic kidney disease: Secondary | ICD-10-CM | POA: Diagnosis not present

## 2022-03-11 DIAGNOSIS — N186 End stage renal disease: Secondary | ICD-10-CM | POA: Diagnosis not present

## 2022-03-11 DIAGNOSIS — N2581 Secondary hyperparathyroidism of renal origin: Secondary | ICD-10-CM | POA: Diagnosis not present

## 2022-03-12 DIAGNOSIS — N2581 Secondary hyperparathyroidism of renal origin: Secondary | ICD-10-CM | POA: Diagnosis not present

## 2022-03-12 DIAGNOSIS — N186 End stage renal disease: Secondary | ICD-10-CM | POA: Diagnosis not present

## 2022-03-12 DIAGNOSIS — D631 Anemia in chronic kidney disease: Secondary | ICD-10-CM | POA: Diagnosis not present

## 2022-03-13 DIAGNOSIS — D631 Anemia in chronic kidney disease: Secondary | ICD-10-CM | POA: Diagnosis not present

## 2022-03-13 DIAGNOSIS — N2581 Secondary hyperparathyroidism of renal origin: Secondary | ICD-10-CM | POA: Diagnosis not present

## 2022-03-13 DIAGNOSIS — N186 End stage renal disease: Secondary | ICD-10-CM | POA: Diagnosis not present

## 2022-03-14 DIAGNOSIS — D631 Anemia in chronic kidney disease: Secondary | ICD-10-CM | POA: Diagnosis not present

## 2022-03-14 DIAGNOSIS — N2581 Secondary hyperparathyroidism of renal origin: Secondary | ICD-10-CM | POA: Diagnosis not present

## 2022-03-14 DIAGNOSIS — N186 End stage renal disease: Secondary | ICD-10-CM | POA: Diagnosis not present

## 2022-03-15 DIAGNOSIS — D631 Anemia in chronic kidney disease: Secondary | ICD-10-CM | POA: Diagnosis not present

## 2022-03-15 DIAGNOSIS — N186 End stage renal disease: Secondary | ICD-10-CM | POA: Diagnosis not present

## 2022-03-15 DIAGNOSIS — N2581 Secondary hyperparathyroidism of renal origin: Secondary | ICD-10-CM | POA: Diagnosis not present

## 2022-03-16 DIAGNOSIS — N2581 Secondary hyperparathyroidism of renal origin: Secondary | ICD-10-CM | POA: Diagnosis not present

## 2022-03-16 DIAGNOSIS — N186 End stage renal disease: Secondary | ICD-10-CM | POA: Diagnosis not present

## 2022-03-16 DIAGNOSIS — D631 Anemia in chronic kidney disease: Secondary | ICD-10-CM | POA: Diagnosis not present

## 2022-03-17 DIAGNOSIS — N2581 Secondary hyperparathyroidism of renal origin: Secondary | ICD-10-CM | POA: Diagnosis not present

## 2022-03-17 DIAGNOSIS — N186 End stage renal disease: Secondary | ICD-10-CM | POA: Diagnosis not present

## 2022-03-17 DIAGNOSIS — D631 Anemia in chronic kidney disease: Secondary | ICD-10-CM | POA: Diagnosis not present

## 2022-03-18 ENCOUNTER — Other Ambulatory Visit: Payer: Self-pay

## 2022-03-18 ENCOUNTER — Encounter (HOSPITAL_COMMUNITY): Payer: Self-pay

## 2022-03-18 ENCOUNTER — Emergency Department (HOSPITAL_COMMUNITY): Payer: Medicare Other

## 2022-03-18 ENCOUNTER — Emergency Department (HOSPITAL_COMMUNITY)
Admission: EM | Admit: 2022-03-18 | Discharge: 2022-03-19 | Disposition: A | Discharge status: Home / Self Care | Payer: Medicare Other | Attending: Emergency Medicine | Admitting: Emergency Medicine

## 2022-03-18 DIAGNOSIS — R188 Other ascites: Secondary | ICD-10-CM | POA: Diagnosis not present

## 2022-03-18 DIAGNOSIS — R109 Unspecified abdominal pain: Secondary | ICD-10-CM | POA: Diagnosis not present

## 2022-03-18 DIAGNOSIS — R7401 Elevation of levels of liver transaminase levels: Secondary | ICD-10-CM | POA: Diagnosis not present

## 2022-03-18 DIAGNOSIS — D72829 Elevated white blood cell count, unspecified: Secondary | ICD-10-CM | POA: Insufficient documentation

## 2022-03-18 DIAGNOSIS — R112 Nausea with vomiting, unspecified: Secondary | ICD-10-CM | POA: Diagnosis not present

## 2022-03-18 DIAGNOSIS — K8020 Calculus of gallbladder without cholecystitis without obstruction: Secondary | ICD-10-CM | POA: Diagnosis not present

## 2022-03-18 DIAGNOSIS — E878 Other disorders of electrolyte and fluid balance, not elsewhere classified: Secondary | ICD-10-CM | POA: Diagnosis not present

## 2022-03-18 DIAGNOSIS — E876 Hypokalemia: Secondary | ICD-10-CM | POA: Diagnosis not present

## 2022-03-18 DIAGNOSIS — N281 Cyst of kidney, acquired: Secondary | ICD-10-CM | POA: Diagnosis not present

## 2022-03-18 DIAGNOSIS — N200 Calculus of kidney: Secondary | ICD-10-CM | POA: Diagnosis not present

## 2022-03-18 DIAGNOSIS — R1110 Vomiting, unspecified: Secondary | ICD-10-CM | POA: Insufficient documentation

## 2022-03-18 DIAGNOSIS — N2581 Secondary hyperparathyroidism of renal origin: Secondary | ICD-10-CM | POA: Diagnosis not present

## 2022-03-18 DIAGNOSIS — N186 End stage renal disease: Secondary | ICD-10-CM | POA: Diagnosis not present

## 2022-03-18 DIAGNOSIS — D631 Anemia in chronic kidney disease: Secondary | ICD-10-CM | POA: Diagnosis not present

## 2022-03-18 DIAGNOSIS — I700 Atherosclerosis of aorta: Secondary | ICD-10-CM | POA: Diagnosis not present

## 2022-03-18 LAB — COMPREHENSIVE METABOLIC PANEL
ALT: 10 U/L (ref 0–44)
AST: 18 U/L (ref 15–41)
Albumin: 2.3 g/dL — ABNORMAL LOW (ref 3.5–5.0)
Alkaline Phosphatase: 544 U/L — ABNORMAL HIGH (ref 38–126)
Anion gap: 17 — ABNORMAL HIGH (ref 5–15)
BUN: 27 mg/dL — ABNORMAL HIGH (ref 6–20)
CO2: 29 mmol/L (ref 22–32)
Calcium: 7.5 mg/dL — ABNORMAL LOW (ref 8.9–10.3)
Chloride: 89 mmol/L — ABNORMAL LOW (ref 98–111)
Creatinine, Ser: 6.32 mg/dL — ABNORMAL HIGH (ref 0.44–1.00)
GFR, Estimated: 7 mL/min — ABNORMAL LOW (ref >60)
Glucose, Bld: 104 mg/dL — ABNORMAL HIGH (ref 70–99)
Potassium: 2.5 mmol/L — CL (ref 3.5–5.1)
Sodium: 135 mmol/L (ref 135–145)
Total Bilirubin: 0.6 mg/dL (ref 0.3–1.2)
Total Protein: 6.8 g/dL (ref 6.5–8.1)

## 2022-03-18 LAB — LACTIC ACID, PLASMA
Lactic Acid, Venous: 2.9 mmol/L (ref 0.5–1.9)
Lactic Acid, Venous: 1.8 mmol/L (ref 0.5–1.9)

## 2022-03-18 LAB — CBC WITH DIFFERENTIAL/PLATELET
Abs Immature Granulocytes: 0.06 10*3/uL (ref 0.00–0.07)
Basophils Absolute: 0 10*3/uL (ref 0.0–0.1)
Basophils Relative: 0 %
Eosinophils Absolute: 0 10*3/uL (ref 0.0–0.5)
Eosinophils Relative: 0 %
HCT: 32.8 % — ABNORMAL LOW (ref 36.0–46.0)
Hemoglobin: 11 g/dL — ABNORMAL LOW (ref 12.0–15.0)
Immature Granulocytes: 1 %
Lymphocytes Relative: 9 %
Lymphs Abs: 0.9 10*3/uL (ref 0.7–4.0)
MCH: 30.6 pg (ref 26.0–34.0)
MCHC: 33.5 g/dL (ref 30.0–36.0)
MCV: 91.4 fL (ref 80.0–100.0)
Monocytes Absolute: 0.5 10*3/uL (ref 0.1–1.0)
Monocytes Relative: 5 %
Neutro Abs: 9.1 10*3/uL — ABNORMAL HIGH (ref 1.7–7.7)
Neutrophils Relative %: 85 %
Platelets: 379 10*3/uL (ref 150–400)
RBC: 3.59 MIL/uL — ABNORMAL LOW (ref 3.87–5.11)
RDW: 14.8 % (ref 11.5–15.5)
WBC: 10.6 10*3/uL — ABNORMAL HIGH (ref 4.0–10.5)
nRBC: 0 % (ref 0.0–0.2)

## 2022-03-18 LAB — HCG, QUANTITATIVE, PREGNANCY: hCG, Beta Chain, Quant, S: 9 m[IU]/mL — ABNORMAL HIGH (ref <5)

## 2022-03-18 LAB — LIPASE, BLOOD: Lipase: 23 U/L (ref 11–51)

## 2022-03-18 LAB — MAGNESIUM: Magnesium: 1.6 mg/dL — ABNORMAL LOW (ref 1.7–2.4)

## 2022-03-18 MED ORDER — SODIUM CHLORIDE 0.9 % IV BOLUS
250.0000 mL | Freq: Once | INTRAVENOUS | Status: AC
  Administered 2022-03-18: 250 mL via INTRAVENOUS

## 2022-03-18 MED ORDER — ONDANSETRON HCL 4 MG/2ML IJ SOLN
INTRAMUSCULAR | Status: AC
  Administered 2022-03-18: 4 mg via INTRAVENOUS
  Filled 2022-03-18: qty 2

## 2022-03-18 MED ORDER — POTASSIUM CHLORIDE CRYS ER 20 MEQ PO TBCR
20.0000 meq | EXTENDED_RELEASE_TABLET | Freq: Once | ORAL | Status: AC
Start: 2022-03-18 — End: 2022-03-18
  Administered 2022-03-18: 20 meq via ORAL
  Filled 2022-03-18: qty 1

## 2022-03-18 MED ORDER — ONDANSETRON HCL 8 MG PO TABS: 8.0000 mg | ORAL_TABLET | Freq: Three times a day (TID) | ORAL | 0 refills | Status: DC | PRN

## 2022-03-18 MED ORDER — IOHEXOL 300 MG/ML  SOLN
80.0000 mL | Freq: Once | INTRAMUSCULAR | Status: AC | PRN
  Administered 2022-03-18: 80 mL via INTRAVENOUS

## 2022-03-18 MED ORDER — ONDANSETRON HCL 4 MG/2ML IJ SOLN: 4.0000 mg | Freq: Once | INTRAMUSCULAR | Status: AC

## 2022-03-18 NOTE — ED Provider Notes (Signed)
[facility] DEPT Provider Note   CSN: [id number] Arrival date & time: 03/18/22  2002     History {Add pertinent medical, surgical, social history, OB history to HPI:1} Chief Complaint  Patient presents with  . Abdominal Pain  . Emesis    Jane [name] is a 56 y.o. female.  HPI Patient presents with her caregiver for evaluation of abdominal pain and vomiting.  Patient lives with her sister and sister's husband, where they manage her care chronically.  She dialyzes 4 times a week, with peritoneal dialysis, last time was this afternoon.  She has had a decreased appetite for about 4 days without  diarrhea.  There is been no known fever, cough, complaint of chest pain.  Patient cannot give any history.  Home Medications Prior to Admission medications   Medication Sig Start Date End Date Taking? Authorizing Provider  albuterol (PROVENTIL HFA;VENTOLIN HFA) 108 (90 Base) MCG/ACT inhaler Inhale 2 puffs into the lungs every 6 (six) hours as needed for wheezing or shortness of breath.    [provider]  B Complex-C-Folic Acid (DIALYVITE 222) 0.8 MG TABS Take 0.8 mg by mouth daily.     [provider]  benztropine (COGENTIN) 1 MG tablet Take 1 mg by mouth at bedtime.    [provider]  divalproex (DEPAKOTE) 500 MG DR tablet Take 500 mg by mouth 2 (two) times daily. 08/02/21   [provider]  Docusate Sodium (DSS) 100 MG CAPS Take 100 mg by mouth daily as needed (constipation).    [provider]  FLUoxetine (PROZAC) 20 MG capsule Take 20 mg by mouth daily. 07/15/20   [provider]  HYDROcodone-acetaminophen (NORCO/VICODIN) 5-325 MG tablet Take 1 tablet by mouth 4 (four) times daily as needed for moderate pain. 12/02/20   [provider]  iron polysaccharides (NIFEREX) 150 MG capsule Take 150 mg by mouth every other day.    [provider]  levETIRAcetam (KEPPRA) 500 MG tablet Take 1 tablet  (500 mg total) by mouth 2 (two) times daily. 09/17/15   Charlynne Cousins, MD  levothyroxine (SYNTHROID, LEVOTHROID) 50 MCG tablet Take 50 mcg by mouth daily with breakfast. 10/05/17   [provider]  magnesium chloride (SLOW-MAG) 64 MG TBEC SR tablet Take 1 tablet (64 mg total) by mouth daily. 9/79/89   Delora Fuel, MD  medroxyPROGESTERone (DEPO-PROVERA) 150 MG/ML injection Inject 150 mg into the muscle every 3 (three) months.    [provider]  midodrine (PROAMATINE) 10 MG tablet Place 1 tablet (10 mg total) into feeding tube 3 (three) times daily with meals. 09/11/21   Danford, Suann Larry, MD  Nutritional Supplements (FEEDING SUPPLEMENT, NEPRO CARB STEADY,) LIQD Take 237 mLs by mouth 2 (two) times daily between meals. 09/12/21   Danford, Suann Larry, MD  ondansetron (ZOFRAN) 4 MG tablet Take 1 tablet (4 mg total) by mouth every 6 (six) hours as needed for nausea. 12/03/92   Delora Fuel, MD  potassium chloride SA (KLOR-CON M) 20 MEQ tablet Take 1 tablet (20 mEq total) by mouth daily with breakfast. 1/74/08   Delora Fuel, MD  risperiDONE (RISPERDAL) 3 MG tablet Take 3 mg by mouth at bedtime.    [provider]  vancomycin (VANCOCIN) 50 mg/mL SOLN oral solution Take 2.5 mLs (125 mg total) by mouth in the morning, at noon, in the evening, and at bedtime. 1/44/81   Delora Fuel, MD  vitamin B-12 (CYANOCOBALAMIN) 500 MCG tablet Take 500 mcg by  mouth daily.    [provider]      Allergies    Icodextrin    Review of Systems   Review of Systems  Physical Exam Updated Vital Signs BP 109/78   Pulse 85   Temp 97.6 F (36.4 C) (Oral)   Resp (!) 24   SpO2 100%  Physical Exam  ED Results / Procedures / Treatments   Labs (all labs ordered are listed, but only abnormal results are displayed) Labs Reviewed  COMPREHENSIVE METABOLIC PANEL - Abnormal; Notable for the following components:      Result Value   Potassium 2.5 (*)    Chloride 89 (*)     Glucose, Bld 104 (*)    BUN 27 (*)    Creatinine, Ser 6.32 (*)    Calcium 7.5 (*)    Albumin 2.3 (*)    Alkaline Phosphatase 544 (*)    GFR, Estimated 7 (*)    Anion gap 17 (*)    All other components within normal limits  CBC WITH DIFFERENTIAL/PLATELET - Abnormal; Notable for the following components:   WBC 10.6 (*)    RBC 3.59 (*)    Hemoglobin 11.0 (*)    HCT 32.8 (*)    Neutro Abs 9.1 (*)    All other components within normal limits  HCG, QUANTITATIVE, PREGNANCY - Abnormal; Notable for the following components:   hCG, Beta Chain, Quant, S 9 (*)    All other components within normal limits  LACTIC ACID, PLASMA - Abnormal; Notable for the following components:   Lactic Acid, Venous 2.9 (*)    All other components within normal limits  CULTURE, BLOOD (ROUTINE X 2)  CULTURE, BLOOD (ROUTINE X 2)  LIPASE, BLOOD  URINALYSIS, ROUTINE W REFLEX MICROSCOPIC  LACTIC ACID, PLASMA  MAGNESIUM    EKG None  Radiology DG Chest 2 View  Result Date: 03/18/2022 CLINICAL DATA:  Abdominal pain and vomiting. EXAM: CHEST - 2 VIEW COMPARISON:  09/08/2021. FINDINGS: The heart size and mediastinal contours are within normal limits. There is atherosclerotic calcification of the aorta. No consolidation, effusion, or pneumothorax. No acute osseous abnormality. IMPRESSION: No active cardiopulmonary disease. Electronically Signed   By: Brett Fairy M.D.   On: 03/18/2022 21:01    Procedures Procedures  {Document cardiac monitor, telemetry assessment procedure when appropriate:1}  Medications Ordered in ED Medications  potassium chloride SA (KLOR-CON M) CR tablet 20 mEq (has no administration in time range)  sodium chloride 0.9 % bolus 250 mL (250 mLs Intravenous New Bag/Given 03/18/22 2104)    ED Course/ Medical Decision Making/ A&P Clinical Course as of 03/18/22 2325  Sat Mar 18, 2022  2251 Patient was tolerating fluids and has not vomited in ED.  As I was discharging her she began to vomit.   We will give a dose of Zofran and reassess. [EW]    Clinical Course User Index [EW] Daleen Bo, MD                           Medical Decision Making Patient with peritoneal dialysis, regularly, presenting with pain, and back pain.  Reported vomiting when she presents to triage.  Decreased appetite for several days.  Differential diagnosis includes peritonitis, intestinal disorders including colitis, metabolic disorders and bowel obstruction.  Amount and/or Complexity of Data Reviewed Independent Historian:     Details: Sister/caregiver at bedside and her husband give history.  They state that the patient was evaluated several  days ago by her nephrologist in the office but they did not want to admit her or do further testing at that time. Labs: ordered.    Details: CBC, metabolic panel, blood cultures, lactate, pregnancy test, urinalysis- Radiology: ordered and independent interpretation performed.    Details: CT abdomen pelvis, chest x-ray-chest x-ray with nonspecific appearing right lower lobe infiltrate.  CT scan-     {Document critical care time when appropriate:1} {Document review of labs and clinical decision tools ie heart score, Chads2Vasc2 etc:1}  {Document your independent review of radiology images, and any outside records:1} {Document your discussion with family members, caretakers, and with consultants:1} {Document social determinants of health affecting pt's care:1} {Document your decision making why or why not admission, treatments were needed:1} Final Clinical Impression(s) / ED Diagnoses Final diagnoses:  None    Rx / DC Orders ED Discharge Orders     None

## 2022-03-18 NOTE — Discharge Instructions (Addendum)
We are prescribing medication to help your nausea, which was sent to your pharmacy.  Start taking it tomorrow morning as needed start with clear liquids only for a day or 2.  After that gradually increase your diet over the next couple of days.  Follow-up with your PCP as soon as possible for further care and treatment and to arrange for ultrasounds of the kidneys to evaluate the abnormality seen on CT, further.  Return here, if needed for problems.

## 2022-03-18 NOTE — ED Triage Notes (Signed)
Caretaker states that pt has been complaining of abdominal pain and vomiting x 3 days. He also wants the wound on her bottom to be checked, he states that she had a previous infection there.

## 2022-03-18 NOTE — ED Notes (Signed)
With CT 

## 2022-03-18 NOTE — ED Notes (Signed)
Urine not collected, still needed.

## 2022-03-18 NOTE — ED Notes (Signed)
Patient has peritoneal dialysis access in the abdomen. Patient was incontinent of stool. Patient was cleaned and repositioned in bed.

## 2022-03-18 NOTE — ED Notes (Signed)
Patient given zofran due to nausea and vomiting.

## 2022-03-19 DIAGNOSIS — N2581 Secondary hyperparathyroidism of renal origin: Secondary | ICD-10-CM | POA: Diagnosis not present

## 2022-03-19 DIAGNOSIS — N186 End stage renal disease: Secondary | ICD-10-CM | POA: Diagnosis not present

## 2022-03-19 DIAGNOSIS — D631 Anemia in chronic kidney disease: Secondary | ICD-10-CM | POA: Diagnosis not present

## 2022-03-20 DIAGNOSIS — N2581 Secondary hyperparathyroidism of renal origin: Secondary | ICD-10-CM | POA: Diagnosis not present

## 2022-03-20 DIAGNOSIS — N186 End stage renal disease: Secondary | ICD-10-CM | POA: Diagnosis not present

## 2022-03-20 DIAGNOSIS — D631 Anemia in chronic kidney disease: Secondary | ICD-10-CM | POA: Diagnosis not present

## 2022-03-21 DIAGNOSIS — D631 Anemia in chronic kidney disease: Secondary | ICD-10-CM | POA: Diagnosis not present

## 2022-03-21 DIAGNOSIS — N2581 Secondary hyperparathyroidism of renal origin: Secondary | ICD-10-CM | POA: Diagnosis not present

## 2022-03-21 DIAGNOSIS — N186 End stage renal disease: Secondary | ICD-10-CM | POA: Diagnosis not present

## 2022-03-22 DIAGNOSIS — N2581 Secondary hyperparathyroidism of renal origin: Secondary | ICD-10-CM | POA: Diagnosis not present

## 2022-03-22 DIAGNOSIS — N186 End stage renal disease: Secondary | ICD-10-CM | POA: Diagnosis not present

## 2022-03-22 DIAGNOSIS — Z992 Dependence on renal dialysis: Secondary | ICD-10-CM | POA: Diagnosis not present

## 2022-03-22 DIAGNOSIS — D631 Anemia in chronic kidney disease: Secondary | ICD-10-CM | POA: Diagnosis not present

## 2022-03-23 DIAGNOSIS — N2581 Secondary hyperparathyroidism of renal origin: Secondary | ICD-10-CM | POA: Diagnosis not present

## 2022-03-23 DIAGNOSIS — D631 Anemia in chronic kidney disease: Secondary | ICD-10-CM | POA: Diagnosis not present

## 2022-03-23 DIAGNOSIS — N186 End stage renal disease: Secondary | ICD-10-CM | POA: Diagnosis not present

## 2022-03-24 DIAGNOSIS — N2581 Secondary hyperparathyroidism of renal origin: Secondary | ICD-10-CM | POA: Diagnosis not present

## 2022-03-24 DIAGNOSIS — N186 End stage renal disease: Secondary | ICD-10-CM | POA: Diagnosis not present

## 2022-03-24 DIAGNOSIS — D631 Anemia in chronic kidney disease: Secondary | ICD-10-CM | POA: Diagnosis not present

## 2022-03-24 LAB — CULTURE, BLOOD (ROUTINE X 2)
Culture: NO GROWTH
Culture: NO GROWTH
Special Requests: ADEQUATE

## 2022-03-25 DIAGNOSIS — N186 End stage renal disease: Secondary | ICD-10-CM | POA: Diagnosis not present

## 2022-03-25 DIAGNOSIS — D631 Anemia in chronic kidney disease: Secondary | ICD-10-CM | POA: Diagnosis not present

## 2022-03-25 DIAGNOSIS — N2581 Secondary hyperparathyroidism of renal origin: Secondary | ICD-10-CM | POA: Diagnosis not present

## 2022-03-26 DIAGNOSIS — N2581 Secondary hyperparathyroidism of renal origin: Secondary | ICD-10-CM | POA: Diagnosis not present

## 2022-03-26 DIAGNOSIS — D631 Anemia in chronic kidney disease: Secondary | ICD-10-CM | POA: Diagnosis not present

## 2022-03-26 DIAGNOSIS — N186 End stage renal disease: Secondary | ICD-10-CM | POA: Diagnosis not present

## 2022-03-27 DIAGNOSIS — N2581 Secondary hyperparathyroidism of renal origin: Secondary | ICD-10-CM | POA: Diagnosis not present

## 2022-03-27 DIAGNOSIS — D631 Anemia in chronic kidney disease: Secondary | ICD-10-CM | POA: Diagnosis not present

## 2022-03-27 DIAGNOSIS — N186 End stage renal disease: Secondary | ICD-10-CM | POA: Diagnosis not present

## 2022-03-28 DIAGNOSIS — N2581 Secondary hyperparathyroidism of renal origin: Secondary | ICD-10-CM | POA: Diagnosis not present

## 2022-03-28 DIAGNOSIS — D631 Anemia in chronic kidney disease: Secondary | ICD-10-CM | POA: Diagnosis not present

## 2022-03-28 DIAGNOSIS — N186 End stage renal disease: Secondary | ICD-10-CM | POA: Diagnosis not present

## 2022-03-29 DIAGNOSIS — N2581 Secondary hyperparathyroidism of renal origin: Secondary | ICD-10-CM | POA: Diagnosis not present

## 2022-03-29 DIAGNOSIS — N186 End stage renal disease: Secondary | ICD-10-CM | POA: Diagnosis not present

## 2022-03-29 DIAGNOSIS — D631 Anemia in chronic kidney disease: Secondary | ICD-10-CM | POA: Diagnosis not present

## 2022-03-30 DIAGNOSIS — D631 Anemia in chronic kidney disease: Secondary | ICD-10-CM | POA: Diagnosis not present

## 2022-03-30 DIAGNOSIS — N186 End stage renal disease: Secondary | ICD-10-CM | POA: Diagnosis not present

## 2022-03-30 DIAGNOSIS — N2581 Secondary hyperparathyroidism of renal origin: Secondary | ICD-10-CM | POA: Diagnosis not present

## 2022-03-31 DIAGNOSIS — N186 End stage renal disease: Secondary | ICD-10-CM | POA: Diagnosis not present

## 2022-03-31 DIAGNOSIS — N2581 Secondary hyperparathyroidism of renal origin: Secondary | ICD-10-CM | POA: Diagnosis not present

## 2022-03-31 DIAGNOSIS — D631 Anemia in chronic kidney disease: Secondary | ICD-10-CM | POA: Diagnosis not present

## 2022-04-01 DIAGNOSIS — D631 Anemia in chronic kidney disease: Secondary | ICD-10-CM | POA: Diagnosis not present

## 2022-04-01 DIAGNOSIS — N2581 Secondary hyperparathyroidism of renal origin: Secondary | ICD-10-CM | POA: Diagnosis not present

## 2022-04-01 DIAGNOSIS — N186 End stage renal disease: Secondary | ICD-10-CM | POA: Diagnosis not present

## 2022-04-02 DIAGNOSIS — N2581 Secondary hyperparathyroidism of renal origin: Secondary | ICD-10-CM | POA: Diagnosis not present

## 2022-04-02 DIAGNOSIS — N186 End stage renal disease: Secondary | ICD-10-CM | POA: Diagnosis not present

## 2022-04-02 DIAGNOSIS — D631 Anemia in chronic kidney disease: Secondary | ICD-10-CM | POA: Diagnosis not present

## 2022-04-03 DIAGNOSIS — D631 Anemia in chronic kidney disease: Secondary | ICD-10-CM | POA: Diagnosis not present

## 2022-04-03 DIAGNOSIS — N2581 Secondary hyperparathyroidism of renal origin: Secondary | ICD-10-CM | POA: Diagnosis not present

## 2022-04-03 DIAGNOSIS — N186 End stage renal disease: Secondary | ICD-10-CM | POA: Diagnosis not present

## 2022-04-04 DIAGNOSIS — D631 Anemia in chronic kidney disease: Secondary | ICD-10-CM | POA: Diagnosis not present

## 2022-04-04 DIAGNOSIS — N186 End stage renal disease: Secondary | ICD-10-CM | POA: Diagnosis not present

## 2022-04-04 DIAGNOSIS — N2581 Secondary hyperparathyroidism of renal origin: Secondary | ICD-10-CM | POA: Diagnosis not present

## 2022-04-05 DIAGNOSIS — N186 End stage renal disease: Secondary | ICD-10-CM | POA: Diagnosis not present

## 2022-04-05 DIAGNOSIS — D631 Anemia in chronic kidney disease: Secondary | ICD-10-CM | POA: Diagnosis not present

## 2022-04-05 DIAGNOSIS — N2581 Secondary hyperparathyroidism of renal origin: Secondary | ICD-10-CM | POA: Diagnosis not present

## 2022-04-06 DIAGNOSIS — N2581 Secondary hyperparathyroidism of renal origin: Secondary | ICD-10-CM | POA: Diagnosis not present

## 2022-04-06 DIAGNOSIS — E782 Mixed hyperlipidemia: Secondary | ICD-10-CM | POA: Diagnosis not present

## 2022-04-06 DIAGNOSIS — Z0001 Encounter for general adult medical examination with abnormal findings: Secondary | ICD-10-CM | POA: Diagnosis not present

## 2022-04-06 DIAGNOSIS — D631 Anemia in chronic kidney disease: Secondary | ICD-10-CM | POA: Diagnosis not present

## 2022-04-06 DIAGNOSIS — M5430 Sciatica, unspecified side: Secondary | ICD-10-CM | POA: Diagnosis not present

## 2022-04-06 DIAGNOSIS — R569 Unspecified convulsions: Secondary | ICD-10-CM | POA: Diagnosis not present

## 2022-04-06 DIAGNOSIS — N186 End stage renal disease: Secondary | ICD-10-CM | POA: Diagnosis not present

## 2022-04-06 DIAGNOSIS — D539 Nutritional anemia, unspecified: Secondary | ICD-10-CM | POA: Diagnosis not present

## 2022-04-06 DIAGNOSIS — E039 Hypothyroidism, unspecified: Secondary | ICD-10-CM | POA: Diagnosis not present

## 2022-04-07 DIAGNOSIS — N186 End stage renal disease: Secondary | ICD-10-CM | POA: Diagnosis not present

## 2022-04-07 DIAGNOSIS — N2581 Secondary hyperparathyroidism of renal origin: Secondary | ICD-10-CM | POA: Diagnosis not present

## 2022-04-07 DIAGNOSIS — D631 Anemia in chronic kidney disease: Secondary | ICD-10-CM | POA: Diagnosis not present

## 2022-04-08 DIAGNOSIS — D631 Anemia in chronic kidney disease: Secondary | ICD-10-CM | POA: Diagnosis not present

## 2022-04-08 DIAGNOSIS — N2581 Secondary hyperparathyroidism of renal origin: Secondary | ICD-10-CM | POA: Diagnosis not present

## 2022-04-08 DIAGNOSIS — N186 End stage renal disease: Secondary | ICD-10-CM | POA: Diagnosis not present

## 2022-04-09 DIAGNOSIS — N186 End stage renal disease: Secondary | ICD-10-CM | POA: Diagnosis not present

## 2022-04-09 DIAGNOSIS — D631 Anemia in chronic kidney disease: Secondary | ICD-10-CM | POA: Diagnosis not present

## 2022-04-09 DIAGNOSIS — N2581 Secondary hyperparathyroidism of renal origin: Secondary | ICD-10-CM | POA: Diagnosis not present

## 2022-04-10 DIAGNOSIS — N186 End stage renal disease: Secondary | ICD-10-CM | POA: Diagnosis not present

## 2022-04-10 DIAGNOSIS — N2581 Secondary hyperparathyroidism of renal origin: Secondary | ICD-10-CM | POA: Diagnosis not present

## 2022-04-10 DIAGNOSIS — D631 Anemia in chronic kidney disease: Secondary | ICD-10-CM | POA: Diagnosis not present

## 2022-04-11 DIAGNOSIS — N2581 Secondary hyperparathyroidism of renal origin: Secondary | ICD-10-CM | POA: Diagnosis not present

## 2022-04-11 DIAGNOSIS — N186 End stage renal disease: Secondary | ICD-10-CM | POA: Diagnosis not present

## 2022-04-11 DIAGNOSIS — D631 Anemia in chronic kidney disease: Secondary | ICD-10-CM | POA: Diagnosis not present

## 2022-04-12 DIAGNOSIS — N186 End stage renal disease: Secondary | ICD-10-CM | POA: Diagnosis not present

## 2022-04-12 DIAGNOSIS — D631 Anemia in chronic kidney disease: Secondary | ICD-10-CM | POA: Diagnosis not present

## 2022-04-12 DIAGNOSIS — N2581 Secondary hyperparathyroidism of renal origin: Secondary | ICD-10-CM | POA: Diagnosis not present

## 2022-04-13 DIAGNOSIS — N186 End stage renal disease: Secondary | ICD-10-CM | POA: Diagnosis not present

## 2022-04-13 DIAGNOSIS — N2581 Secondary hyperparathyroidism of renal origin: Secondary | ICD-10-CM | POA: Diagnosis not present

## 2022-04-13 DIAGNOSIS — D631 Anemia in chronic kidney disease: Secondary | ICD-10-CM | POA: Diagnosis not present

## 2022-04-14 DIAGNOSIS — D631 Anemia in chronic kidney disease: Secondary | ICD-10-CM | POA: Diagnosis not present

## 2022-04-14 DIAGNOSIS — N186 End stage renal disease: Secondary | ICD-10-CM | POA: Diagnosis not present

## 2022-04-14 DIAGNOSIS — N2581 Secondary hyperparathyroidism of renal origin: Secondary | ICD-10-CM | POA: Diagnosis not present

## 2022-04-15 DIAGNOSIS — N2581 Secondary hyperparathyroidism of renal origin: Secondary | ICD-10-CM | POA: Diagnosis not present

## 2022-04-15 DIAGNOSIS — N186 End stage renal disease: Secondary | ICD-10-CM | POA: Diagnosis not present

## 2022-04-15 DIAGNOSIS — D631 Anemia in chronic kidney disease: Secondary | ICD-10-CM | POA: Diagnosis not present

## 2022-04-16 DIAGNOSIS — N186 End stage renal disease: Secondary | ICD-10-CM | POA: Diagnosis not present

## 2022-04-16 DIAGNOSIS — N2581 Secondary hyperparathyroidism of renal origin: Secondary | ICD-10-CM | POA: Diagnosis not present

## 2022-04-16 DIAGNOSIS — D631 Anemia in chronic kidney disease: Secondary | ICD-10-CM | POA: Diagnosis not present

## 2022-04-17 DIAGNOSIS — N2581 Secondary hyperparathyroidism of renal origin: Secondary | ICD-10-CM | POA: Diagnosis not present

## 2022-04-17 DIAGNOSIS — D631 Anemia in chronic kidney disease: Secondary | ICD-10-CM | POA: Diagnosis not present

## 2022-04-17 DIAGNOSIS — N186 End stage renal disease: Secondary | ICD-10-CM | POA: Diagnosis not present

## 2022-04-18 DIAGNOSIS — N2581 Secondary hyperparathyroidism of renal origin: Secondary | ICD-10-CM | POA: Diagnosis not present

## 2022-04-18 DIAGNOSIS — D631 Anemia in chronic kidney disease: Secondary | ICD-10-CM | POA: Diagnosis not present

## 2022-04-18 DIAGNOSIS — N186 End stage renal disease: Secondary | ICD-10-CM | POA: Diagnosis not present

## 2022-04-19 DIAGNOSIS — N2581 Secondary hyperparathyroidism of renal origin: Secondary | ICD-10-CM | POA: Diagnosis not present

## 2022-04-19 DIAGNOSIS — N186 End stage renal disease: Secondary | ICD-10-CM | POA: Diagnosis not present

## 2022-04-19 DIAGNOSIS — D631 Anemia in chronic kidney disease: Secondary | ICD-10-CM | POA: Diagnosis not present

## 2022-04-20 DIAGNOSIS — N2581 Secondary hyperparathyroidism of renal origin: Secondary | ICD-10-CM | POA: Diagnosis not present

## 2022-04-20 DIAGNOSIS — N186 End stage renal disease: Secondary | ICD-10-CM | POA: Diagnosis not present

## 2022-04-20 DIAGNOSIS — D631 Anemia in chronic kidney disease: Secondary | ICD-10-CM | POA: Diagnosis not present

## 2022-04-21 DIAGNOSIS — D631 Anemia in chronic kidney disease: Secondary | ICD-10-CM | POA: Diagnosis not present

## 2022-04-21 DIAGNOSIS — N2581 Secondary hyperparathyroidism of renal origin: Secondary | ICD-10-CM | POA: Diagnosis not present

## 2022-04-21 DIAGNOSIS — N186 End stage renal disease: Secondary | ICD-10-CM | POA: Diagnosis not present

## 2022-04-21 DIAGNOSIS — Z992 Dependence on renal dialysis: Secondary | ICD-10-CM | POA: Diagnosis not present

## 2022-04-22 DIAGNOSIS — N2581 Secondary hyperparathyroidism of renal origin: Secondary | ICD-10-CM | POA: Diagnosis not present

## 2022-04-22 DIAGNOSIS — N186 End stage renal disease: Secondary | ICD-10-CM | POA: Diagnosis not present

## 2022-04-22 DIAGNOSIS — D631 Anemia in chronic kidney disease: Secondary | ICD-10-CM | POA: Diagnosis not present

## 2022-04-23 DIAGNOSIS — D631 Anemia in chronic kidney disease: Secondary | ICD-10-CM | POA: Diagnosis not present

## 2022-04-23 DIAGNOSIS — N186 End stage renal disease: Secondary | ICD-10-CM | POA: Diagnosis not present

## 2022-04-23 DIAGNOSIS — N2581 Secondary hyperparathyroidism of renal origin: Secondary | ICD-10-CM | POA: Diagnosis not present

## 2022-04-24 DIAGNOSIS — N2581 Secondary hyperparathyroidism of renal origin: Secondary | ICD-10-CM | POA: Diagnosis not present

## 2022-04-24 DIAGNOSIS — D631 Anemia in chronic kidney disease: Secondary | ICD-10-CM | POA: Diagnosis not present

## 2022-04-24 DIAGNOSIS — N186 End stage renal disease: Secondary | ICD-10-CM | POA: Diagnosis not present

## 2022-04-25 DIAGNOSIS — N2581 Secondary hyperparathyroidism of renal origin: Secondary | ICD-10-CM | POA: Diagnosis not present

## 2022-04-25 DIAGNOSIS — D631 Anemia in chronic kidney disease: Secondary | ICD-10-CM | POA: Diagnosis not present

## 2022-04-25 DIAGNOSIS — N186 End stage renal disease: Secondary | ICD-10-CM | POA: Diagnosis not present

## 2022-04-26 DIAGNOSIS — N2581 Secondary hyperparathyroidism of renal origin: Secondary | ICD-10-CM | POA: Diagnosis not present

## 2022-04-26 DIAGNOSIS — D631 Anemia in chronic kidney disease: Secondary | ICD-10-CM | POA: Diagnosis not present

## 2022-04-26 DIAGNOSIS — N186 End stage renal disease: Secondary | ICD-10-CM | POA: Diagnosis not present

## 2022-04-27 DIAGNOSIS — D631 Anemia in chronic kidney disease: Secondary | ICD-10-CM | POA: Diagnosis not present

## 2022-04-27 DIAGNOSIS — N186 End stage renal disease: Secondary | ICD-10-CM | POA: Diagnosis not present

## 2022-04-27 DIAGNOSIS — N2581 Secondary hyperparathyroidism of renal origin: Secondary | ICD-10-CM | POA: Diagnosis not present

## 2022-04-28 DIAGNOSIS — D631 Anemia in chronic kidney disease: Secondary | ICD-10-CM | POA: Diagnosis not present

## 2022-04-28 DIAGNOSIS — N2581 Secondary hyperparathyroidism of renal origin: Secondary | ICD-10-CM | POA: Diagnosis not present

## 2022-04-28 DIAGNOSIS — N186 End stage renal disease: Secondary | ICD-10-CM | POA: Diagnosis not present

## 2022-04-29 DIAGNOSIS — N186 End stage renal disease: Secondary | ICD-10-CM | POA: Diagnosis not present

## 2022-04-29 DIAGNOSIS — N2581 Secondary hyperparathyroidism of renal origin: Secondary | ICD-10-CM | POA: Diagnosis not present

## 2022-04-29 DIAGNOSIS — D631 Anemia in chronic kidney disease: Secondary | ICD-10-CM | POA: Diagnosis not present

## 2022-04-30 DIAGNOSIS — N186 End stage renal disease: Secondary | ICD-10-CM | POA: Diagnosis not present

## 2022-04-30 DIAGNOSIS — N2581 Secondary hyperparathyroidism of renal origin: Secondary | ICD-10-CM | POA: Diagnosis not present

## 2022-04-30 DIAGNOSIS — D631 Anemia in chronic kidney disease: Secondary | ICD-10-CM | POA: Diagnosis not present

## 2022-05-01 DIAGNOSIS — D631 Anemia in chronic kidney disease: Secondary | ICD-10-CM | POA: Diagnosis not present

## 2022-05-01 DIAGNOSIS — N186 End stage renal disease: Secondary | ICD-10-CM | POA: Diagnosis not present

## 2022-05-01 DIAGNOSIS — N2581 Secondary hyperparathyroidism of renal origin: Secondary | ICD-10-CM | POA: Diagnosis not present

## 2022-05-02 DIAGNOSIS — N186 End stage renal disease: Secondary | ICD-10-CM | POA: Diagnosis not present

## 2022-05-02 DIAGNOSIS — D631 Anemia in chronic kidney disease: Secondary | ICD-10-CM | POA: Diagnosis not present

## 2022-05-02 DIAGNOSIS — N2581 Secondary hyperparathyroidism of renal origin: Secondary | ICD-10-CM | POA: Diagnosis not present

## 2022-05-03 DIAGNOSIS — N2581 Secondary hyperparathyroidism of renal origin: Secondary | ICD-10-CM | POA: Diagnosis not present

## 2022-05-03 DIAGNOSIS — F29 Unspecified psychosis not due to a substance or known physiological condition: Secondary | ICD-10-CM | POA: Diagnosis not present

## 2022-05-03 DIAGNOSIS — D631 Anemia in chronic kidney disease: Secondary | ICD-10-CM | POA: Diagnosis not present

## 2022-05-03 DIAGNOSIS — F319 Bipolar disorder, unspecified: Secondary | ICD-10-CM | POA: Diagnosis not present

## 2022-05-03 DIAGNOSIS — N186 End stage renal disease: Secondary | ICD-10-CM | POA: Diagnosis not present

## 2022-05-03 DIAGNOSIS — F250 Schizoaffective disorder, bipolar type: Secondary | ICD-10-CM | POA: Diagnosis not present

## 2022-05-03 DIAGNOSIS — F71 Moderate intellectual disabilities: Secondary | ICD-10-CM | POA: Diagnosis not present

## 2022-05-04 DIAGNOSIS — N186 End stage renal disease: Secondary | ICD-10-CM | POA: Diagnosis not present

## 2022-05-04 DIAGNOSIS — N2581 Secondary hyperparathyroidism of renal origin: Secondary | ICD-10-CM | POA: Diagnosis not present

## 2022-05-04 DIAGNOSIS — D631 Anemia in chronic kidney disease: Secondary | ICD-10-CM | POA: Diagnosis not present

## 2022-05-05 DIAGNOSIS — N186 End stage renal disease: Secondary | ICD-10-CM | POA: Diagnosis not present

## 2022-05-05 DIAGNOSIS — D631 Anemia in chronic kidney disease: Secondary | ICD-10-CM | POA: Diagnosis not present

## 2022-05-05 DIAGNOSIS — N2581 Secondary hyperparathyroidism of renal origin: Secondary | ICD-10-CM | POA: Diagnosis not present

## 2022-05-06 DIAGNOSIS — D631 Anemia in chronic kidney disease: Secondary | ICD-10-CM | POA: Diagnosis not present

## 2022-05-06 DIAGNOSIS — N186 End stage renal disease: Secondary | ICD-10-CM | POA: Diagnosis not present

## 2022-05-06 DIAGNOSIS — N2581 Secondary hyperparathyroidism of renal origin: Secondary | ICD-10-CM | POA: Diagnosis not present

## 2022-05-07 DIAGNOSIS — D631 Anemia in chronic kidney disease: Secondary | ICD-10-CM | POA: Diagnosis not present

## 2022-05-07 DIAGNOSIS — N2581 Secondary hyperparathyroidism of renal origin: Secondary | ICD-10-CM | POA: Diagnosis not present

## 2022-05-07 DIAGNOSIS — N186 End stage renal disease: Secondary | ICD-10-CM | POA: Diagnosis not present

## 2022-05-08 DIAGNOSIS — D631 Anemia in chronic kidney disease: Secondary | ICD-10-CM | POA: Diagnosis not present

## 2022-05-08 DIAGNOSIS — N2581 Secondary hyperparathyroidism of renal origin: Secondary | ICD-10-CM | POA: Diagnosis not present

## 2022-05-08 DIAGNOSIS — N186 End stage renal disease: Secondary | ICD-10-CM | POA: Diagnosis not present

## 2022-05-09 DIAGNOSIS — N186 End stage renal disease: Secondary | ICD-10-CM | POA: Diagnosis not present

## 2022-05-09 DIAGNOSIS — N2581 Secondary hyperparathyroidism of renal origin: Secondary | ICD-10-CM | POA: Diagnosis not present

## 2022-05-09 DIAGNOSIS — D631 Anemia in chronic kidney disease: Secondary | ICD-10-CM | POA: Diagnosis not present

## 2022-05-10 DIAGNOSIS — N186 End stage renal disease: Secondary | ICD-10-CM | POA: Diagnosis not present

## 2022-05-10 DIAGNOSIS — N2581 Secondary hyperparathyroidism of renal origin: Secondary | ICD-10-CM | POA: Diagnosis not present

## 2022-05-10 DIAGNOSIS — D631 Anemia in chronic kidney disease: Secondary | ICD-10-CM | POA: Diagnosis not present

## 2022-05-11 DIAGNOSIS — N2581 Secondary hyperparathyroidism of renal origin: Secondary | ICD-10-CM | POA: Diagnosis not present

## 2022-05-11 DIAGNOSIS — D631 Anemia in chronic kidney disease: Secondary | ICD-10-CM | POA: Diagnosis not present

## 2022-05-11 DIAGNOSIS — N186 End stage renal disease: Secondary | ICD-10-CM | POA: Diagnosis not present

## 2022-05-12 DIAGNOSIS — N186 End stage renal disease: Secondary | ICD-10-CM | POA: Diagnosis not present

## 2022-05-12 DIAGNOSIS — N2581 Secondary hyperparathyroidism of renal origin: Secondary | ICD-10-CM | POA: Diagnosis not present

## 2022-05-12 DIAGNOSIS — D631 Anemia in chronic kidney disease: Secondary | ICD-10-CM | POA: Diagnosis not present

## 2022-05-13 DIAGNOSIS — D631 Anemia in chronic kidney disease: Secondary | ICD-10-CM | POA: Diagnosis not present

## 2022-05-13 DIAGNOSIS — N186 End stage renal disease: Secondary | ICD-10-CM | POA: Diagnosis not present

## 2022-05-13 DIAGNOSIS — N2581 Secondary hyperparathyroidism of renal origin: Secondary | ICD-10-CM | POA: Diagnosis not present

## 2022-05-14 DIAGNOSIS — N2581 Secondary hyperparathyroidism of renal origin: Secondary | ICD-10-CM | POA: Diagnosis not present

## 2022-05-14 DIAGNOSIS — N186 End stage renal disease: Secondary | ICD-10-CM | POA: Diagnosis not present

## 2022-05-14 DIAGNOSIS — D631 Anemia in chronic kidney disease: Secondary | ICD-10-CM | POA: Diagnosis not present

## 2022-05-15 DIAGNOSIS — D631 Anemia in chronic kidney disease: Secondary | ICD-10-CM | POA: Diagnosis not present

## 2022-05-15 DIAGNOSIS — N2581 Secondary hyperparathyroidism of renal origin: Secondary | ICD-10-CM | POA: Diagnosis not present

## 2022-05-15 DIAGNOSIS — N186 End stage renal disease: Secondary | ICD-10-CM | POA: Diagnosis not present

## 2022-05-16 DIAGNOSIS — N2581 Secondary hyperparathyroidism of renal origin: Secondary | ICD-10-CM | POA: Diagnosis not present

## 2022-05-16 DIAGNOSIS — N186 End stage renal disease: Secondary | ICD-10-CM | POA: Diagnosis not present

## 2022-05-16 DIAGNOSIS — D631 Anemia in chronic kidney disease: Secondary | ICD-10-CM | POA: Diagnosis not present

## 2022-05-17 DIAGNOSIS — N2581 Secondary hyperparathyroidism of renal origin: Secondary | ICD-10-CM | POA: Diagnosis not present

## 2022-05-17 DIAGNOSIS — N186 End stage renal disease: Secondary | ICD-10-CM | POA: Diagnosis not present

## 2022-05-17 DIAGNOSIS — D631 Anemia in chronic kidney disease: Secondary | ICD-10-CM | POA: Diagnosis not present

## 2022-05-18 DIAGNOSIS — Z0000 Encounter for general adult medical examination without abnormal findings: Secondary | ICD-10-CM | POA: Diagnosis not present

## 2022-05-18 DIAGNOSIS — D539 Nutritional anemia, unspecified: Secondary | ICD-10-CM | POA: Diagnosis not present

## 2022-05-18 DIAGNOSIS — R569 Unspecified convulsions: Secondary | ICD-10-CM | POA: Diagnosis not present

## 2022-05-18 DIAGNOSIS — M5430 Sciatica, unspecified side: Secondary | ICD-10-CM | POA: Diagnosis not present

## 2022-05-18 DIAGNOSIS — E039 Hypothyroidism, unspecified: Secondary | ICD-10-CM | POA: Diagnosis not present

## 2022-05-18 DIAGNOSIS — E782 Mixed hyperlipidemia: Secondary | ICD-10-CM | POA: Diagnosis not present

## 2022-05-18 DIAGNOSIS — N2581 Secondary hyperparathyroidism of renal origin: Secondary | ICD-10-CM | POA: Diagnosis not present

## 2022-05-18 DIAGNOSIS — N186 End stage renal disease: Secondary | ICD-10-CM | POA: Diagnosis not present

## 2022-05-18 DIAGNOSIS — D631 Anemia in chronic kidney disease: Secondary | ICD-10-CM | POA: Diagnosis not present

## 2022-05-19 DIAGNOSIS — N186 End stage renal disease: Secondary | ICD-10-CM | POA: Diagnosis not present

## 2022-05-19 DIAGNOSIS — N2581 Secondary hyperparathyroidism of renal origin: Secondary | ICD-10-CM | POA: Diagnosis not present

## 2022-05-19 DIAGNOSIS — D631 Anemia in chronic kidney disease: Secondary | ICD-10-CM | POA: Diagnosis not present

## 2022-05-20 DIAGNOSIS — N2581 Secondary hyperparathyroidism of renal origin: Secondary | ICD-10-CM | POA: Diagnosis not present

## 2022-05-20 DIAGNOSIS — N186 End stage renal disease: Secondary | ICD-10-CM | POA: Diagnosis not present

## 2022-05-20 DIAGNOSIS — D631 Anemia in chronic kidney disease: Secondary | ICD-10-CM | POA: Diagnosis not present

## 2022-05-21 DIAGNOSIS — N186 End stage renal disease: Secondary | ICD-10-CM | POA: Diagnosis not present

## 2022-05-21 DIAGNOSIS — N2581 Secondary hyperparathyroidism of renal origin: Secondary | ICD-10-CM | POA: Diagnosis not present

## 2022-05-21 DIAGNOSIS — D631 Anemia in chronic kidney disease: Secondary | ICD-10-CM | POA: Diagnosis not present

## 2022-05-22 DIAGNOSIS — D631 Anemia in chronic kidney disease: Secondary | ICD-10-CM | POA: Diagnosis not present

## 2022-05-22 DIAGNOSIS — N2581 Secondary hyperparathyroidism of renal origin: Secondary | ICD-10-CM | POA: Diagnosis not present

## 2022-05-22 DIAGNOSIS — N186 End stage renal disease: Secondary | ICD-10-CM | POA: Diagnosis not present

## 2022-05-22 DIAGNOSIS — Z992 Dependence on renal dialysis: Secondary | ICD-10-CM | POA: Diagnosis not present

## 2022-05-23 DIAGNOSIS — D631 Anemia in chronic kidney disease: Secondary | ICD-10-CM | POA: Diagnosis not present

## 2022-05-23 DIAGNOSIS — N2581 Secondary hyperparathyroidism of renal origin: Secondary | ICD-10-CM | POA: Diagnosis not present

## 2022-05-23 DIAGNOSIS — N186 End stage renal disease: Secondary | ICD-10-CM | POA: Diagnosis not present

## 2022-05-24 DIAGNOSIS — D631 Anemia in chronic kidney disease: Secondary | ICD-10-CM | POA: Diagnosis not present

## 2022-05-24 DIAGNOSIS — N186 End stage renal disease: Secondary | ICD-10-CM | POA: Diagnosis not present

## 2022-05-24 DIAGNOSIS — N2581 Secondary hyperparathyroidism of renal origin: Secondary | ICD-10-CM | POA: Diagnosis not present

## 2022-05-25 DIAGNOSIS — D631 Anemia in chronic kidney disease: Secondary | ICD-10-CM | POA: Diagnosis not present

## 2022-05-25 DIAGNOSIS — N186 End stage renal disease: Secondary | ICD-10-CM | POA: Diagnosis not present

## 2022-05-25 DIAGNOSIS — N2581 Secondary hyperparathyroidism of renal origin: Secondary | ICD-10-CM | POA: Diagnosis not present

## 2022-05-26 DIAGNOSIS — N2581 Secondary hyperparathyroidism of renal origin: Secondary | ICD-10-CM | POA: Diagnosis not present

## 2022-05-26 DIAGNOSIS — D631 Anemia in chronic kidney disease: Secondary | ICD-10-CM | POA: Diagnosis not present

## 2022-05-26 DIAGNOSIS — N186 End stage renal disease: Secondary | ICD-10-CM | POA: Diagnosis not present

## 2022-05-27 DIAGNOSIS — D631 Anemia in chronic kidney disease: Secondary | ICD-10-CM | POA: Diagnosis not present

## 2022-05-27 DIAGNOSIS — N186 End stage renal disease: Secondary | ICD-10-CM | POA: Diagnosis not present

## 2022-05-27 DIAGNOSIS — N2581 Secondary hyperparathyroidism of renal origin: Secondary | ICD-10-CM | POA: Diagnosis not present

## 2022-05-28 DIAGNOSIS — N2581 Secondary hyperparathyroidism of renal origin: Secondary | ICD-10-CM | POA: Diagnosis not present

## 2022-05-28 DIAGNOSIS — N186 End stage renal disease: Secondary | ICD-10-CM | POA: Diagnosis not present

## 2022-05-28 DIAGNOSIS — D631 Anemia in chronic kidney disease: Secondary | ICD-10-CM | POA: Diagnosis not present

## 2022-05-29 DIAGNOSIS — N2581 Secondary hyperparathyroidism of renal origin: Secondary | ICD-10-CM | POA: Diagnosis not present

## 2022-05-29 DIAGNOSIS — D631 Anemia in chronic kidney disease: Secondary | ICD-10-CM | POA: Diagnosis not present

## 2022-05-29 DIAGNOSIS — N186 End stage renal disease: Secondary | ICD-10-CM | POA: Diagnosis not present

## 2022-05-30 DIAGNOSIS — N2581 Secondary hyperparathyroidism of renal origin: Secondary | ICD-10-CM | POA: Diagnosis not present

## 2022-05-30 DIAGNOSIS — D631 Anemia in chronic kidney disease: Secondary | ICD-10-CM | POA: Diagnosis not present

## 2022-05-30 DIAGNOSIS — N186 End stage renal disease: Secondary | ICD-10-CM | POA: Diagnosis not present

## 2022-05-31 DIAGNOSIS — D631 Anemia in chronic kidney disease: Secondary | ICD-10-CM | POA: Diagnosis not present

## 2022-05-31 DIAGNOSIS — N2581 Secondary hyperparathyroidism of renal origin: Secondary | ICD-10-CM | POA: Diagnosis not present

## 2022-05-31 DIAGNOSIS — N186 End stage renal disease: Secondary | ICD-10-CM | POA: Diagnosis not present

## 2022-06-01 DIAGNOSIS — N2581 Secondary hyperparathyroidism of renal origin: Secondary | ICD-10-CM | POA: Diagnosis not present

## 2022-06-01 DIAGNOSIS — N186 End stage renal disease: Secondary | ICD-10-CM | POA: Diagnosis not present

## 2022-06-01 DIAGNOSIS — D631 Anemia in chronic kidney disease: Secondary | ICD-10-CM | POA: Diagnosis not present

## 2022-06-02 DIAGNOSIS — N186 End stage renal disease: Secondary | ICD-10-CM | POA: Diagnosis not present

## 2022-06-02 DIAGNOSIS — N2581 Secondary hyperparathyroidism of renal origin: Secondary | ICD-10-CM | POA: Diagnosis not present

## 2022-06-02 DIAGNOSIS — D631 Anemia in chronic kidney disease: Secondary | ICD-10-CM | POA: Diagnosis not present

## 2022-06-03 DIAGNOSIS — D631 Anemia in chronic kidney disease: Secondary | ICD-10-CM | POA: Diagnosis not present

## 2022-06-03 DIAGNOSIS — N2581 Secondary hyperparathyroidism of renal origin: Secondary | ICD-10-CM | POA: Diagnosis not present

## 2022-06-03 DIAGNOSIS — N186 End stage renal disease: Secondary | ICD-10-CM | POA: Diagnosis not present

## 2022-06-04 DIAGNOSIS — N186 End stage renal disease: Secondary | ICD-10-CM | POA: Diagnosis not present

## 2022-06-04 DIAGNOSIS — D631 Anemia in chronic kidney disease: Secondary | ICD-10-CM | POA: Diagnosis not present

## 2022-06-04 DIAGNOSIS — N2581 Secondary hyperparathyroidism of renal origin: Secondary | ICD-10-CM | POA: Diagnosis not present

## 2022-06-05 DIAGNOSIS — N2581 Secondary hyperparathyroidism of renal origin: Secondary | ICD-10-CM | POA: Diagnosis not present

## 2022-06-05 DIAGNOSIS — N186 End stage renal disease: Secondary | ICD-10-CM | POA: Diagnosis not present

## 2022-06-05 DIAGNOSIS — D631 Anemia in chronic kidney disease: Secondary | ICD-10-CM | POA: Diagnosis not present

## 2022-06-06 DIAGNOSIS — N186 End stage renal disease: Secondary | ICD-10-CM | POA: Diagnosis not present

## 2022-06-06 DIAGNOSIS — D631 Anemia in chronic kidney disease: Secondary | ICD-10-CM | POA: Diagnosis not present

## 2022-06-06 DIAGNOSIS — N2581 Secondary hyperparathyroidism of renal origin: Secondary | ICD-10-CM | POA: Diagnosis not present

## 2022-06-07 DIAGNOSIS — N2581 Secondary hyperparathyroidism of renal origin: Secondary | ICD-10-CM | POA: Diagnosis not present

## 2022-06-07 DIAGNOSIS — D631 Anemia in chronic kidney disease: Secondary | ICD-10-CM | POA: Diagnosis not present

## 2022-06-07 DIAGNOSIS — N186 End stage renal disease: Secondary | ICD-10-CM | POA: Diagnosis not present

## 2022-06-08 DIAGNOSIS — D631 Anemia in chronic kidney disease: Secondary | ICD-10-CM | POA: Diagnosis not present

## 2022-06-08 DIAGNOSIS — N2581 Secondary hyperparathyroidism of renal origin: Secondary | ICD-10-CM | POA: Diagnosis not present

## 2022-06-08 DIAGNOSIS — N186 End stage renal disease: Secondary | ICD-10-CM | POA: Diagnosis not present

## 2022-06-09 DIAGNOSIS — N2581 Secondary hyperparathyroidism of renal origin: Secondary | ICD-10-CM | POA: Diagnosis not present

## 2022-06-09 DIAGNOSIS — D631 Anemia in chronic kidney disease: Secondary | ICD-10-CM | POA: Diagnosis not present

## 2022-06-09 DIAGNOSIS — N186 End stage renal disease: Secondary | ICD-10-CM | POA: Diagnosis not present

## 2022-06-10 DIAGNOSIS — N2581 Secondary hyperparathyroidism of renal origin: Secondary | ICD-10-CM | POA: Diagnosis not present

## 2022-06-10 DIAGNOSIS — D631 Anemia in chronic kidney disease: Secondary | ICD-10-CM | POA: Diagnosis not present

## 2022-06-10 DIAGNOSIS — N186 End stage renal disease: Secondary | ICD-10-CM | POA: Diagnosis not present

## 2022-06-11 DIAGNOSIS — D631 Anemia in chronic kidney disease: Secondary | ICD-10-CM | POA: Diagnosis not present

## 2022-06-11 DIAGNOSIS — N186 End stage renal disease: Secondary | ICD-10-CM | POA: Diagnosis not present

## 2022-06-11 DIAGNOSIS — N2581 Secondary hyperparathyroidism of renal origin: Secondary | ICD-10-CM | POA: Diagnosis not present

## 2022-06-12 DIAGNOSIS — N186 End stage renal disease: Secondary | ICD-10-CM | POA: Diagnosis not present

## 2022-06-12 DIAGNOSIS — N2581 Secondary hyperparathyroidism of renal origin: Secondary | ICD-10-CM | POA: Diagnosis not present

## 2022-06-12 DIAGNOSIS — D631 Anemia in chronic kidney disease: Secondary | ICD-10-CM | POA: Diagnosis not present

## 2022-06-13 DIAGNOSIS — N186 End stage renal disease: Secondary | ICD-10-CM | POA: Diagnosis not present

## 2022-06-13 DIAGNOSIS — D631 Anemia in chronic kidney disease: Secondary | ICD-10-CM | POA: Diagnosis not present

## 2022-06-13 DIAGNOSIS — N2581 Secondary hyperparathyroidism of renal origin: Secondary | ICD-10-CM | POA: Diagnosis not present

## 2022-06-14 DIAGNOSIS — N2581 Secondary hyperparathyroidism of renal origin: Secondary | ICD-10-CM | POA: Diagnosis not present

## 2022-06-14 DIAGNOSIS — N186 End stage renal disease: Secondary | ICD-10-CM | POA: Diagnosis not present

## 2022-06-14 DIAGNOSIS — D631 Anemia in chronic kidney disease: Secondary | ICD-10-CM | POA: Diagnosis not present

## 2022-06-15 DIAGNOSIS — D631 Anemia in chronic kidney disease: Secondary | ICD-10-CM | POA: Diagnosis not present

## 2022-06-15 DIAGNOSIS — N2581 Secondary hyperparathyroidism of renal origin: Secondary | ICD-10-CM | POA: Diagnosis not present

## 2022-06-15 DIAGNOSIS — N186 End stage renal disease: Secondary | ICD-10-CM | POA: Diagnosis not present

## 2022-06-16 DIAGNOSIS — D631 Anemia in chronic kidney disease: Secondary | ICD-10-CM | POA: Diagnosis not present

## 2022-06-16 DIAGNOSIS — N2581 Secondary hyperparathyroidism of renal origin: Secondary | ICD-10-CM | POA: Diagnosis not present

## 2022-06-16 DIAGNOSIS — N186 End stage renal disease: Secondary | ICD-10-CM | POA: Diagnosis not present

## 2022-06-17 DIAGNOSIS — D631 Anemia in chronic kidney disease: Secondary | ICD-10-CM | POA: Diagnosis not present

## 2022-06-17 DIAGNOSIS — N186 End stage renal disease: Secondary | ICD-10-CM | POA: Diagnosis not present

## 2022-06-17 DIAGNOSIS — N2581 Secondary hyperparathyroidism of renal origin: Secondary | ICD-10-CM | POA: Diagnosis not present

## 2022-06-18 DIAGNOSIS — N186 End stage renal disease: Secondary | ICD-10-CM | POA: Diagnosis not present

## 2022-06-18 DIAGNOSIS — N2581 Secondary hyperparathyroidism of renal origin: Secondary | ICD-10-CM | POA: Diagnosis not present

## 2022-06-18 DIAGNOSIS — D631 Anemia in chronic kidney disease: Secondary | ICD-10-CM | POA: Diagnosis not present

## 2022-06-19 DIAGNOSIS — D631 Anemia in chronic kidney disease: Secondary | ICD-10-CM | POA: Diagnosis not present

## 2022-06-19 DIAGNOSIS — N186 End stage renal disease: Secondary | ICD-10-CM | POA: Diagnosis not present

## 2022-06-19 DIAGNOSIS — N2581 Secondary hyperparathyroidism of renal origin: Secondary | ICD-10-CM | POA: Diagnosis not present

## 2022-06-20 DIAGNOSIS — D631 Anemia in chronic kidney disease: Secondary | ICD-10-CM | POA: Diagnosis not present

## 2022-06-20 DIAGNOSIS — N2581 Secondary hyperparathyroidism of renal origin: Secondary | ICD-10-CM | POA: Diagnosis not present

## 2022-06-20 DIAGNOSIS — N186 End stage renal disease: Secondary | ICD-10-CM | POA: Diagnosis not present

## 2022-06-21 DIAGNOSIS — N186 End stage renal disease: Secondary | ICD-10-CM | POA: Diagnosis not present

## 2022-06-21 DIAGNOSIS — D631 Anemia in chronic kidney disease: Secondary | ICD-10-CM | POA: Diagnosis not present

## 2022-06-21 DIAGNOSIS — N2581 Secondary hyperparathyroidism of renal origin: Secondary | ICD-10-CM | POA: Diagnosis not present

## 2022-06-22 DIAGNOSIS — D631 Anemia in chronic kidney disease: Secondary | ICD-10-CM | POA: Diagnosis not present

## 2022-06-22 DIAGNOSIS — N2581 Secondary hyperparathyroidism of renal origin: Secondary | ICD-10-CM | POA: Diagnosis not present

## 2022-06-22 DIAGNOSIS — N186 End stage renal disease: Secondary | ICD-10-CM | POA: Diagnosis not present

## 2022-06-22 DIAGNOSIS — Z992 Dependence on renal dialysis: Secondary | ICD-10-CM | POA: Diagnosis not present

## 2022-06-23 DIAGNOSIS — N186 End stage renal disease: Secondary | ICD-10-CM | POA: Diagnosis not present

## 2022-06-23 DIAGNOSIS — N2581 Secondary hyperparathyroidism of renal origin: Secondary | ICD-10-CM | POA: Diagnosis not present

## 2022-06-23 DIAGNOSIS — D631 Anemia in chronic kidney disease: Secondary | ICD-10-CM | POA: Diagnosis not present

## 2022-06-24 DIAGNOSIS — N186 End stage renal disease: Secondary | ICD-10-CM | POA: Diagnosis not present

## 2022-06-24 DIAGNOSIS — N2581 Secondary hyperparathyroidism of renal origin: Secondary | ICD-10-CM | POA: Diagnosis not present

## 2022-06-24 DIAGNOSIS — D631 Anemia in chronic kidney disease: Secondary | ICD-10-CM | POA: Diagnosis not present

## 2022-06-25 DIAGNOSIS — D631 Anemia in chronic kidney disease: Secondary | ICD-10-CM | POA: Diagnosis not present

## 2022-06-25 DIAGNOSIS — N186 End stage renal disease: Secondary | ICD-10-CM | POA: Diagnosis not present

## 2022-06-25 DIAGNOSIS — N2581 Secondary hyperparathyroidism of renal origin: Secondary | ICD-10-CM | POA: Diagnosis not present

## 2022-06-26 DIAGNOSIS — D631 Anemia in chronic kidney disease: Secondary | ICD-10-CM | POA: Diagnosis not present

## 2022-06-26 DIAGNOSIS — N186 End stage renal disease: Secondary | ICD-10-CM | POA: Diagnosis not present

## 2022-06-26 DIAGNOSIS — N2581 Secondary hyperparathyroidism of renal origin: Secondary | ICD-10-CM | POA: Diagnosis not present

## 2022-06-27 ENCOUNTER — Other Ambulatory Visit: Payer: Self-pay

## 2022-06-27 ENCOUNTER — Emergency Department (HOSPITAL_COMMUNITY): Payer: Medicare Other

## 2022-06-27 ENCOUNTER — Emergency Department (HOSPITAL_COMMUNITY)
Admission: EM | Admit: 2022-06-27 | Discharge: 2022-06-28 | Disposition: A | Discharge status: Home / Self Care | Payer: Medicare Other | Attending: Emergency Medicine | Admitting: Emergency Medicine

## 2022-06-27 ENCOUNTER — Encounter (HOSPITAL_COMMUNITY): Payer: Self-pay | Admitting: Emergency Medicine

## 2022-06-27 DIAGNOSIS — N186 End stage renal disease: Secondary | ICD-10-CM | POA: Insufficient documentation

## 2022-06-27 DIAGNOSIS — K2100 Gastro-esophageal reflux disease with esophagitis, without bleeding: Secondary | ICD-10-CM | POA: Insufficient documentation

## 2022-06-27 DIAGNOSIS — Z992 Dependence on renal dialysis: Secondary | ICD-10-CM | POA: Diagnosis not present

## 2022-06-27 DIAGNOSIS — R000 Tachycardia, unspecified: Secondary | ICD-10-CM | POA: Diagnosis not present

## 2022-06-27 DIAGNOSIS — E876 Hypokalemia: Secondary | ICD-10-CM | POA: Diagnosis not present

## 2022-06-27 DIAGNOSIS — Z20822 Contact with and (suspected) exposure to covid-19: Secondary | ICD-10-CM | POA: Diagnosis not present

## 2022-06-27 DIAGNOSIS — R112 Nausea with vomiting, unspecified: Secondary | ICD-10-CM | POA: Diagnosis present

## 2022-06-27 DIAGNOSIS — K644 Residual hemorrhoidal skin tags: Secondary | ICD-10-CM

## 2022-06-27 DIAGNOSIS — K8020 Calculus of gallbladder without cholecystitis without obstruction: Secondary | ICD-10-CM | POA: Diagnosis not present

## 2022-06-27 DIAGNOSIS — D631 Anemia in chronic kidney disease: Secondary | ICD-10-CM | POA: Diagnosis not present

## 2022-06-27 DIAGNOSIS — Z79899 Other long term (current) drug therapy: Secondary | ICD-10-CM | POA: Insufficient documentation

## 2022-06-27 DIAGNOSIS — K648 Other hemorrhoids: Secondary | ICD-10-CM | POA: Diagnosis not present

## 2022-06-27 DIAGNOSIS — E039 Hypothyroidism, unspecified: Secondary | ICD-10-CM | POA: Insufficient documentation

## 2022-06-27 DIAGNOSIS — K649 Unspecified hemorrhoids: Secondary | ICD-10-CM | POA: Insufficient documentation

## 2022-06-27 DIAGNOSIS — I120 Hypertensive chronic kidney disease with stage 5 chronic kidney disease or end stage renal disease: Secondary | ICD-10-CM | POA: Diagnosis not present

## 2022-06-27 DIAGNOSIS — N2581 Secondary hyperparathyroidism of renal origin: Secondary | ICD-10-CM | POA: Diagnosis not present

## 2022-06-27 LAB — CBC WITH DIFFERENTIAL/PLATELET
Abs Immature Granulocytes: 0.06 10*3/uL (ref 0.00–0.07)
Basophils Absolute: 0 10*3/uL (ref 0.0–0.1)
Basophils Relative: 0 %
Eosinophils Absolute: 0 10*3/uL (ref 0.0–0.5)
Eosinophils Relative: 0 %
HCT: 32 % — ABNORMAL LOW (ref 36.0–46.0)
Hemoglobin: 10.7 g/dL — ABNORMAL LOW (ref 12.0–15.0)
Immature Granulocytes: 1 %
Lymphocytes Relative: 8 %
Lymphs Abs: 0.7 10*3/uL (ref 0.7–4.0)
MCH: 31.2 pg (ref 26.0–34.0)
MCHC: 33.4 g/dL (ref 30.0–36.0)
MCV: 93.3 fL (ref 80.0–100.0)
Monocytes Absolute: 0.5 10*3/uL (ref 0.1–1.0)
Monocytes Relative: 5 %
Neutro Abs: 8.4 10*3/uL — ABNORMAL HIGH (ref 1.7–7.7)
Neutrophils Relative %: 86 %
Platelets: 493 10*3/uL — ABNORMAL HIGH (ref 150–400)
RBC: 3.43 MIL/uL — ABNORMAL LOW (ref 3.87–5.11)
RDW: 14.5 % (ref 11.5–15.5)
WBC: 9.7 10*3/uL (ref 4.0–10.5)
nRBC: 0 % (ref 0.0–0.2)

## 2022-06-27 LAB — COMPREHENSIVE METABOLIC PANEL
ALT: 10 U/L (ref 0–44)
AST: 22 U/L (ref 15–41)
Albumin: 1.9 g/dL — ABNORMAL LOW (ref 3.5–5.0)
Alkaline Phosphatase: 489 U/L — ABNORMAL HIGH (ref 38–126)
Anion gap: 17 — ABNORMAL HIGH (ref 5–15)
BUN: 29 mg/dL — ABNORMAL HIGH (ref 6–20)
CO2: 29 mmol/L (ref 22–32)
Calcium: 8.1 mg/dL — ABNORMAL LOW (ref 8.9–10.3)
Chloride: 92 mmol/L — ABNORMAL LOW (ref 98–111)
Creatinine, Ser: 5.94 mg/dL — ABNORMAL HIGH (ref 0.44–1.00)
GFR, Estimated: 8 mL/min — ABNORMAL LOW (ref >60)
Glucose, Bld: 132 mg/dL — ABNORMAL HIGH (ref 70–99)
Potassium: 2.8 mmol/L — ABNORMAL LOW (ref 3.5–5.1)
Sodium: 138 mmol/L (ref 135–145)
Total Bilirubin: 1.2 mg/dL (ref 0.3–1.2)
Total Protein: 6.4 g/dL — ABNORMAL LOW (ref 6.5–8.1)

## 2022-06-27 LAB — LIPASE, BLOOD: Lipase: 25 U/L (ref 11–51)

## 2022-06-27 MED ORDER — LORAZEPAM 1 MG PO TABS
1.0000 mg | ORAL_TABLET | Freq: Once | ORAL | Status: AC
Start: 2022-06-27 — End: 2022-06-27
  Administered 2022-06-27: 1 mg via ORAL
  Filled 2022-06-27: qty 1

## 2022-06-27 MED ORDER — LORAZEPAM 1 MG PO TABS: 2.0000 mg | ORAL_TABLET | Freq: Once | ORAL | Status: DC

## 2022-06-27 MED ORDER — OXYCODONE-ACETAMINOPHEN 5-325 MG PO TABS
1.0000 | ORAL_TABLET | Freq: Once | ORAL | Status: AC
  Administered 2022-06-27: 1 via ORAL
  Filled 2022-06-27: qty 1

## 2022-06-27 NOTE — ED Triage Notes (Signed)
Patient reports pain across her abdomen with emesis and rectal pain onset today , she is on peritoneal dialysis .

## 2022-06-27 NOTE — ED Provider Triage Note (Signed)
Emergency Medicine Provider Triage Evaluation Note  Jane [name] , a 56 y.o. female  was evaluated in triage.  Pt complains of abdominal pain, nausea, vomiting.  Symptoms began several days ago.  History of end-stage renal disease on peritoneal dialysis.  Patient had a normal bowel movement today.  No melena or hematochezia.  No fever but does report chills.  No prior history of abdominal surgeries.  Patient also complains of rectal pain.  Review of Systems  Positive: Abdominal pain, nausea, vomiting Negative: Urinary symptoms, bloody stools, diarrhea, fevers  Physical Exam  BP (!) 115/95 (BP Location: Right Arm)   Pulse (!) 109   Temp (!) 97.5 F (36.4 C) (Oral)   Resp (!) 22   SpO2 100%  Gen:   Awake, no distress   Resp:  Normal effort  MSK:   Moves extremities without difficulty  Other:  Abdominal pain to palpation of the entire abdomen.  Bowel sounds are decreased.  Patient appears uncomfortable on exam.  Medical Decision Making  Medically screening exam initiated at 8:38 PM.  Appropriate orders placed.  Jane [name] was informed that the remainder of the evaluation will be completed by another provider, this initial triage assessment does not replace that evaluation, and the importance of remaining in the ED until their evaluation is complete.  Patient reports to the ER for evaluation of abdominal pain.  Diffuse abdominal pain to palpation.  Bowel sounds are decreased.  We will need to obtain lab work and imaging to rule out intra-abdominal pathology possible bowel obstruction.    Doristine Devoid, PA-C 06/27/22 2039

## 2022-06-27 NOTE — ED Provider Notes (Signed)
Patient was initially triaged by previous PA, coming in with abdominal pain, history of  mental delay as well as end-stage renal disease currently getting peritoneal dialysis, has not missed any sessions, she has been having nausea vomiting for the last day as well as stomach pain I was notified by nursing staff that patient is screaming in pain out in the lobby, on reassessment she is difficult to assess as she is screaming, caregivers at bedside, she states that they are unable to to get a CT scan because she was too much pain, will provide her with Ativan as well as oxycodone, and keep her up in triage to further monitor.   Marcello Fennel, PA-C 06/27/22 2339    Merrily Pew, MD 06/27/22 [phone]

## 2022-06-27 NOTE — ED Notes (Signed)
Patient screaming out in lobby. Support person stating she is in pain and needs a room. Triage RN notified and patient taken to triage for re-assessment.

## 2022-06-28 ENCOUNTER — Emergency Department (HOSPITAL_COMMUNITY): Payer: Medicare Other

## 2022-06-28 DIAGNOSIS — N186 End stage renal disease: Secondary | ICD-10-CM | POA: Diagnosis not present

## 2022-06-28 DIAGNOSIS — D631 Anemia in chronic kidney disease: Secondary | ICD-10-CM | POA: Diagnosis not present

## 2022-06-28 DIAGNOSIS — K2100 Gastro-esophageal reflux disease with esophagitis, without bleeding: Secondary | ICD-10-CM | POA: Diagnosis not present

## 2022-06-28 DIAGNOSIS — K8020 Calculus of gallbladder without cholecystitis without obstruction: Secondary | ICD-10-CM | POA: Diagnosis not present

## 2022-06-28 DIAGNOSIS — N2581 Secondary hyperparathyroidism of renal origin: Secondary | ICD-10-CM | POA: Diagnosis not present

## 2022-06-28 LAB — MAGNESIUM: Magnesium: 1.7 mg/dL (ref 1.7–2.4)

## 2022-06-28 LAB — RESP PANEL BY RT-PCR (FLU A&B, COVID) ARPGX2
Influenza A by PCR: NEGATIVE
Influenza B by PCR: NEGATIVE
SARS Coronavirus 2 by RT PCR: NEGATIVE

## 2022-06-28 MED ORDER — PANTOPRAZOLE SODIUM 20 MG PO TBEC
20.0000 mg | DELAYED_RELEASE_TABLET | Freq: Every day | ORAL | 0 refills | Status: AC
Start: 2022-06-28 — End: 2022-07-12

## 2022-06-28 MED ORDER — DICYCLOMINE HCL 10 MG/5ML PO SOLN
10.0000 mg | Freq: Once | ORAL | Status: AC
  Administered 2022-06-28: 10 mg via ORAL
  Filled 2022-06-28: qty 5

## 2022-06-28 MED ORDER — ONDANSETRON HCL 4 MG PO TABS
4.0000 mg | ORAL_TABLET | Freq: Once | ORAL | Status: AC
  Administered 2022-06-28: 4 mg via ORAL
  Filled 2022-06-28: qty 1

## 2022-06-28 MED ORDER — ONDANSETRON HCL 4 MG PO TABS: 4.0000 mg | ORAL_TABLET | ORAL | 0 refills | Status: AC | PRN

## 2022-06-28 MED ORDER — HYDROCORTISONE (PERIANAL) 2.5 % EX CREA: 1.0000 | TOPICAL_CREAM | Freq: Two times a day (BID) | CUTANEOUS | 0 refills | Status: AC

## 2022-06-28 MED ORDER — SUCRALFATE 1 G PO TABS: 1.0000 g | ORAL_TABLET | Freq: Three times a day (TID) | ORAL | 0 refills | Status: AC

## 2022-06-28 MED ORDER — LIDOCAINE VISCOUS HCL 2 % MT SOLN
15.0000 mL | Freq: Once | OROMUCOSAL | Status: AC
  Administered 2022-06-28: 15 mL via ORAL
  Filled 2022-06-28: qty 15

## 2022-06-28 MED ORDER — POTASSIUM CHLORIDE CRYS ER 20 MEQ PO TBCR
40.0000 meq | EXTENDED_RELEASE_TABLET | Freq: Once | ORAL | Status: AC
  Administered 2022-06-28: 40 meq via ORAL
  Filled 2022-06-28: qty 2

## 2022-06-28 MED ORDER — ALUM & MAG HYDROXIDE-SIMETH 200-200-20 MG/5ML PO SUSP
30.0000 mL | Freq: Once | ORAL | Status: AC
  Administered 2022-06-28: 30 mL via ORAL
  Filled 2022-06-28: qty 30

## 2022-06-28 NOTE — ED Notes (Signed)
Pt agreed to go to CT, previously refused. CT made aware

## 2022-06-28 NOTE — ED Notes (Signed)
Pt given ginger ale to drink.  Currently not feeling the need to vomit or nauseous per patient.

## 2022-06-28 NOTE — Discharge Instructions (Addendum)
Please follow up on mychart regarding your covid 19 test   It was a pleasure caring for you today in the emergency department.  Please return to the emergency department for any worsening or worrisome symptoms.

## 2022-06-28 NOTE — ED Provider Notes (Signed)
[facility] EMERGENCY DEPARTMENT Provider Note   CSN: [id number] Arrival date & time: 06/27/22  1907     History  Chief Complaint  Patient presents with   Abdominal Pain    Peritoneal Dialysis Jane [name]    Jane [name] is [name] 56 y.o. female.  Patient as above with significant medical history as below, including ESRD on PD, bipolar, hld, htn, GERD, schizophrenia who presents to the ED with complaint of abd pain/n/v. Pt accompanied by sister/caretaker. Reports epigastric pain over the last 2-3 days, nausea, poor pO intake. No medications for these symptoms since onset.  Similar episode in the past, sister reports they are given anti-vomiting medication and discharged home.  Patient reports her symptoms have improved after analgesics that were given in triage.  No further episodes of vomiting.  Nausea has greatly improved.  Fevers or chills last few days.  She is compliant with her home peritoneal dialysis, no color change to her diastole.  She does make some small amount of urine still, no dysuria or hematuria reported.  No diarrhea, no recent travel or sick contacts.    Past Medical History:  Diagnosis Date   Abnormal gait    Anemia    Bipolar disorder (HCC)    Depression    ESRD on peritoneal dialysis ([place])    ESRD on peritoneal dialysis ([place])    GERD (gastroesophageal reflux disease)    Hyperlipemia    Hypertension    Hypothyroidism    Moderately mentally retarded    Schizophrenia ([place])    Seizures ([place])    No recent seizures - no meds    Past Surgical History:  Procedure Laterality Date   BREAST REDUCTION SURGERY     BREAST SURGERY     breast reduction   EXAMINATION UNDER ANESTHESIA N/[name] 12/08/2013   Procedure: EXAM UNDER ANESTHESIA;  Surgeon: Lavonia Drafts, MD;  Location: [facility];  Service: Gynecology;  Laterality: N/[name];  Pelvic exam and pap smear, unable to tolerate during last office visit   FOOT SURGERY     right   IR FLUORO GUIDE CV LINE  RIGHT  02/01/2021   IR REMOVAL TUN CV CATH W/O FL  03/15/2021   IR US GUIDE VASC ACCESS RIGHT  02/01/2021   LUMBAR LAMINECTOMY/DECOMPRESSION MICRODISCECTOMY N/[name] 01/25/2021   Procedure: Lumbar Five -Sacral One Laminectomy for Epidural Abscess;  Surgeon: Earnie Larsson, MD;  Location[facility];  Service: Neurosurgery;  Laterality: N/[name];   REDUCTION MAMMAPLASTY Bilateral      The history is provided by the patient and [name] relative. No language interpreter was used.  Abdominal Pain Associated symptoms: nausea and vomiting   Associated symptoms: no chest pain, no cough, no dysuria, no fever and no shortness of breath        Home Medications Prior to Admission medications   Medication Sig Start Date End Date Taking? Authorizing Provider  hydrocortisone (ANUSOL-HC) 2.5 % rectal cream Place 1 Application rectally 2 (two) times daily. 06/28/22  Yes Jane Dove A, DO  ondansetron (ZOFRAN) 4 MG tablet Take 1 tablet (4 mg total) by mouth every 4 (four) hours as needed for nausea or vomiting. 06/28/22  Yes Jane Dove A, DO  pantoprazole (PROTONIX) 20 MG tablet Take 1 tablet (20 mg total) by mouth daily for 14 days. 06/28/22 07/12/22 Yes Jane Dove A, DO  sucralfate (CARAFATE) 1 g tablet Take 1 tablet (1 g total) by mouth with breakfast, with lunch, and with evening meal for 7 days. 06/28/22 07/05/22  Yes Jane Dove A, DO  albuterol (PROVENTIL HFA;VENTOLIN HFA) 108 (90 Base) MCG/ACT inhaler Inhale 2 puffs into the lungs every 6 (six) hours as needed for wheezing or shortness of breath.    [provider]  B Complex-C-Folic Acid (DIALYVITE 161) 0.8 MG TABS Take 0.8 mg by mouth daily.     [provider]  benztropine (COGENTIN) 1 MG tablet Take 1 mg by mouth at bedtime.    [provider]  divalproex (DEPAKOTE) 500 MG DR tablet Take 500 mg by mouth 2 (two) times daily. 08/02/21   [provider]  Docusate Sodium (DSS) 100 MG CAPS Take 100 mg by mouth daily as needed (constipation).     [provider]  FLUoxetine (PROZAC) 20 MG capsule Take 20 mg by mouth daily. 07/15/20   [provider]  iron polysaccharides (NIFEREX) 150 MG capsule Take 150 mg by mouth every other day.    [provider]  levETIRAcetam (KEPPRA) 500 MG tablet Take 1 tablet (500 mg total) by mouth 2 (two) times daily. 09/17/15   Jane Cousins, MD  levothyroxine (SYNTHROID, LEVOTHROID) 50 MCG tablet Take 50 mcg by mouth daily with breakfast. 10/05/17   [provider]  losartan (COZAAR) 50 MG tablet Take 50 mg by mouth daily as needed. >160 03/15/22   [provider]  medroxyPROGESTERone (DEPO-PROVERA) 150 MG/ML injection Inject 150 mg into the muscle every 3 (three) months.    [provider]  midodrine (PROAMATINE) 10 MG tablet Place 1 tablet (10 mg total) into feeding tube 3 (three) times daily with meals. 09/11/21   Danford, Suann Larry, MD  potassium chloride SA (KLOR-CON M) 20 MEQ tablet Take 1 tablet (20 mEq total) by mouth daily with breakfast. Patient taking differently: Take 10 mEq by mouth daily with breakfast. 0/96/04   Jane Fuel, MD  risperiDONE (RISPERDAL) 3 MG tablet Take 3 mg by mouth at bedtime.    [provider]      Allergies    Icodextrin    Review of Systems   Review of Systems  Constitutional:  Negative for activity change and fever.  HENT:  Negative for facial swelling and trouble swallowing.   Eyes:  Negative for discharge and redness.  Respiratory:  Negative for cough and shortness of breath.   Cardiovascular:  Negative for chest pain and palpitations.  Gastrointestinal:  Positive for abdominal pain, nausea and vomiting.  Genitourinary:  Negative for dysuria and flank pain.  Musculoskeletal:  Negative for back pain and gait problem.  Skin:  Negative for pallor and rash.  Neurological:  Negative for syncope and headaches.    Physical Exam Updated Vital Signs BP 99/64 (BP Location: Left Arm)   Pulse (!)  108   Temp 98.2 F (36.8 C) (Oral)   Resp 14   SpO2 99%  Physical Exam Vitals and nursing note reviewed. Exam conducted with [name] chaperone present.  Constitutional:      General: She is not in acute distress.    Appearance: Normal appearance.  HENT:     Head: Normocephalic and atraumatic.     Right Ear: External ear normal.     Left Ear: External ear normal.     Nose: Nose normal.     Mouth/Throat:     Mouth: Mucous membranes are moist.  Eyes:     General: No scleral icterus.       Right eye: No discharge.        Left eye: No  discharge.  Cardiovascular:     Rate and Rhythm: Normal rate and regular rhythm.     Pulses: Normal pulses.     Heart sounds: Normal heart sounds.  Pulmonary:     Effort: Pulmonary effort is normal. No tachypnea, accessory muscle usage or respiratory distress.     Breath sounds: Normal breath sounds.  Abdominal:     General: Abdomen is flat.     Palpations: Abdomen is soft.     Tenderness: There is abdominal tenderness in the epigastric area. There is no guarding or rebound.     Comments: PD site clean, dry dressing, no cellulitic changes.  No TTP around dialysis access.  Mild epigastric ttp, no rebound, not peritoneal  Genitourinary:      Comments: External hemorrhoid noted, not thrombosed, no frank bleeding from rectum, no bleeding from hemorrhoid  Musculoskeletal:        General: Normal range of motion.     Cervical back: Normal range of motion.     Right lower leg: No edema.     Left lower leg: No edema.  Skin:    General: Skin is warm and dry.     Capillary Refill: Capillary refill takes less than 2 seconds.  Neurological:     Mental Status: She is alert and oriented to person, place, and time. Mental status is at baseline.     GCS: GCS eye subscore is 4. GCS verbal subscore is 5. GCS motor subscore is 6.  Psychiatric:        Mood and Affect: Mood normal.        Behavior: Behavior normal.     ED Results / Procedures / Treatments    Labs (all labs ordered are listed, but only abnormal results are displayed) Labs Reviewed  CBC WITH DIFFERENTIAL/PLATELET - Abnormal; Notable for the following components:      Result Value   RBC 3.43 (*)    Hemoglobin 10.7 (*)    HCT 32.0 (*)    Platelets 493 (*)    Neutro Abs 8.4 (*)    All other components within normal limits  COMPREHENSIVE METABOLIC PANEL - Abnormal; Notable for the following components:   Potassium 2.8 (*)    Chloride 92 (*)    Glucose, Bld 132 (*)    BUN 29 (*)    Creatinine, Ser 5.94 (*)    Calcium 8.1 (*)    Total Protein 6.4 (*)    Albumin 1.9 (*)    Alkaline Phosphatase 489 (*)    GFR, Estimated 8 (*)    Anion gap 17 (*)    All other components within normal limits  RESP PANEL BY RT-PCR (FLU [name]&B, COVID) ARPGX2  LIPASE, BLOOD  MAGNESIUM  URINALYSIS, ROUTINE W REFLEX MICROSCOPIC    EKG EKG Interpretation  Date/Time:  Tuesday June 27 2022 20:28:34 EDT Ventricular Rate:  110 PR Interval:  136 QRS Duration: 86 QT Interval:  396 QTC Calculation: 535 R Axis:   -21 Text Interpretation: Sinus tachycardia Minimal voltage criteria for LVH, may be normal variant ( Cornell product ) Possible Anterolateral infarct , age undetermined Prolonged QT Abnormal ECG When compared with ECG of 27-Jun-2022 20:27, PREVIOUS ECG IS PRESENT no stemi Confirmed by Jane Dove ([id number]) on 06/28/2022 4:57:36 AM  Radiology CT ABDOMEN PELVIS WO CONTRAST  Result Date: 06/28/2022 CLINICAL DATA:  Acute, nonlocalized abdominal pain EXAM: CT ABDOMEN AND PELVIS WITHOUT CONTRAST TECHNIQUE: Multidetector CT imaging of the abdomen and pelvis was performed following the standard protocol  without IV contrast. RADIATION DOSE REDUCTION: This exam was performed according to the departmental dose-optimization program which includes automated exposure control, adjustment of the mA and/or kV according to patient size and/or use of iterative reconstruction technique. COMPARISON:  03/18/2022  FINDINGS: Lower chest: Circumferential lower esophageal thickening also seen on prior. Coronary atheromatous calcification. Hepatobiliary: No focal liver abnormality.Cholelithiasis. No bile duct dilatation. Pancreas: Unremarkable. Spleen: Unremarkable. Adrenals/Urinary Tract: Negative adrenals. History of end-stage renal disease with diffuse calcification and innumerable cysts by prior postcontrast imaging. No hydronephrosis. Unremarkable bladder for degree of distension. Stomach/Bowel:  No obstruction. No appendicitis. Vascular/Lymphatic: No acute vascular abnormality. No mass or adenopathy. Reproductive:No pathologic findings. Other: Moderate dialysate.  Tenckhoff catheter in the right gutter. Musculoskeletal: Body wall edema, mainly recumbent. There is diffuse osteopenia with rugger [place] spine. Destructive disease at L5-S1 with endplate fragmentation and collapse causing severe spinal and biforaminal stenosis. Facets are similarly involved. No visible progression from prior. AVN of the femoral heads. IMPRESSION: 1. No acute intra-abdominal finding. No visible inflammation or obstruction at the rectum. 2. Continued lower esophageal thickening suggesting esophagitis. 3. L5-S1 chronic destructive related to discitis/osteomyelitis in 2022. Chronic severe impingement at this level. 4. Anasarca. 5. Moderate dialysate with located Tenckhoff catheter. 6. Cholelithiasis. Electronically Signed   By: Jorje Guild M.D.   On: 06/28/2022 05:00    Procedures Procedures    Medications Ordered in ED Medications  oxyCODONE-acetaminophen (PERCOCET/ROXICET) 5-325 MG per tablet 1 tablet (1 tablet Oral Given 06/27/22 2339)  LORazepam (ATIVAN) tablet 1 mg (1 mg Oral Given 06/27/22 2339)  alum & mag hydroxide-simeth (MAALOX/MYLANTA) 200-200-20 MG/5ML suspension 30 mL (30 mLs Oral Given 06/28/22 0605)    And  lidocaine (XYLOCAINE) 2 % viscous mouth solution 15 mL (15 mLs Oral Given 06/28/22 0606)  dicyclomine (BENTYL) 10 MG/5ML  solution 10 mg (10 mg Oral Given 06/28/22 0608)  ondansetron (ZOFRAN) tablet 4 mg (4 mg Oral Given 06/28/22 0605)  potassium chloride SA (KLOR-CON M) CR tablet 40 mEq (40 mEq Oral Given 06/28/22 0605)    ED Course/ Medical Decision Making/ [name]&P                           Medical Decision Making Risk OTC drugs. Prescription drug management.   This patient presents to the ED with chief complaint(s) of abd pain/n/v with pertinent past medical history of ESRD on PD, schizophrenia, as above which further complicates the presenting complaint. The complaint involves an extensive differential diagnosis and also carries with it [name] high risk of complications and morbidity.   Differential diagnosis includes but is not exclusive to acute cholecystitis, intrathoracic causes for epigastric abdominal pain, gastritis, duodenitis, pancreatitis, small bowel or large bowel obstruction, abdominal aortic aneurysm, hernia, gastritis, etc.  Serious etiologies were considered.   The initial plan is to screening labs/imaging ordered in triage. Give gi cocktail, check covid   Additional history obtained: Additional history obtained from family Records reviewed Primary Care Documents and recent ed visits, prior labs/imaging  Independent labs interpretation:  The following labs were independently interpreted:  K is mildly depleted, similar to prior. Cr similar to her baseline. Alk phos elevated, similar to prior. T bili is WNL.  No WBC elevation, afebrile, VSS; not septic   Independent visualization of imaging: - I independently visualized the following imaging with scope of interpretation limited to determining acute life threatening conditions related to emergency care: CTAP, which revealed esophagitis, cholelithiasis w/o cholecystitis, anasarca, chronic changes to L5-S1  [name]/w osteomyelitis from 2022 per radiology.   Cardiac monitoring was reviewed and interpreted by myself which shows NSR  Treatment and  Reassessment: Analgesic Gi cocktail >> symptoms resolved, tolerating PO w/o difficulty, overall feeling much better  Consultation: - Consulted or discussed management/test interpretation w/ external professional: n/[name]  Consideration for admission or further workup: Admission was considered    Pt with abdominal pain, she has hypokalemia on her metabolic panel which was noted prior.  Given oral potassium replacement.  Her magnesium was within normal limits.  Her alk phos remained elevated.  She has cholelithiasis on imaging, no evidence of cholecystitis.  Esophagitis noted on CT, she has epigastric discomfort on palpation.  Concern the esophagitis potential the etiology of her discomfort, nausea and vomiting.  Family denies care with gastroenterology, unable to see any visits from GI in the EMR.  Patient's symptoms greatly improved after GI cocktail. She is tolerating PO w/o difficulty.  Recommend she follow-up with GI.  I have low suspicion for peritonitis at this time.  Diasylate is normal in color per caregiver, no drainage or tenderness to abdomen or PD site. She is not septic. No mental status changes. She is at her baseline per caregiver at bedside.  Non toxic. She is tolerant p.o. intake without difficulty.  Symptoms greatly improved. Back to her baseline per family at bedside.   Discussed hemorrhoid care with caregiver, sitz bath/anusol/stool softener.   Family reports ready to leave, they will f/u on mychart regarding covid test results  The patient improved significantly and was discharged in stable condition. Detailed discussions were had with the patient regarding current findings, and need for close f/u with PCP or on call doctor. The patient has been instructed to return immediately if the symptoms worsen in any way for re-evaluation. Patient verbalized understanding and is in agreement with current care plan. All questions answered prior to discharge.   Social Determinants of  health: Social History   Tobacco Use   Smoking status: Never   Smokeless tobacco: Never  Substance Use Topics   Alcohol use: No   Drug use: No            Final Clinical Impression(s) / ED Diagnoses Final diagnoses:  Gastroesophageal reflux disease with esophagitis, unspecified whether hemorrhage  External hemorrhoid    Rx / DC Orders ED Discharge Orders          Ordered    Ambulatory referral to Gastroenterology        06/28/22 0700    ondansetron (ZOFRAN) 4 MG tablet  Every 4 hours PRN        06/28/22 0701    sucralfate (CARAFATE) 1 g tablet  3 times daily with meals        06/28/22 0701    pantoprazole (PROTONIX) 20 MG tablet  Daily        06/28/22 0701    hydrocortisone (ANUSOL-HC) 2.5 % rectal cream  2 times daily        06/28/22 0701              Jeanell Sparrow, DO 06/28/22 [phone]

## 2022-06-28 NOTE — ED Notes (Signed)
Patient transported to CT 

## 2022-06-29 DIAGNOSIS — D631 Anemia in chronic kidney disease: Secondary | ICD-10-CM | POA: Diagnosis not present

## 2022-06-29 DIAGNOSIS — N2581 Secondary hyperparathyroidism of renal origin: Secondary | ICD-10-CM | POA: Diagnosis not present

## 2022-06-29 DIAGNOSIS — N186 End stage renal disease: Secondary | ICD-10-CM | POA: Diagnosis not present

## 2022-06-30 DIAGNOSIS — N186 End stage renal disease: Secondary | ICD-10-CM | POA: Diagnosis not present

## 2022-06-30 DIAGNOSIS — N2581 Secondary hyperparathyroidism of renal origin: Secondary | ICD-10-CM | POA: Diagnosis not present

## 2022-06-30 DIAGNOSIS — D631 Anemia in chronic kidney disease: Secondary | ICD-10-CM | POA: Diagnosis not present

## 2022-07-01 DIAGNOSIS — D631 Anemia in chronic kidney disease: Secondary | ICD-10-CM | POA: Diagnosis not present

## 2022-07-01 DIAGNOSIS — N186 End stage renal disease: Secondary | ICD-10-CM | POA: Diagnosis not present

## 2022-07-01 DIAGNOSIS — N2581 Secondary hyperparathyroidism of renal origin: Secondary | ICD-10-CM | POA: Diagnosis not present

## 2022-07-02 DIAGNOSIS — D631 Anemia in chronic kidney disease: Secondary | ICD-10-CM | POA: Diagnosis not present

## 2022-07-02 DIAGNOSIS — N186 End stage renal disease: Secondary | ICD-10-CM | POA: Diagnosis not present

## 2022-07-02 DIAGNOSIS — N2581 Secondary hyperparathyroidism of renal origin: Secondary | ICD-10-CM | POA: Diagnosis not present

## 2022-07-03 DIAGNOSIS — N186 End stage renal disease: Secondary | ICD-10-CM | POA: Diagnosis not present

## 2022-07-03 DIAGNOSIS — N2581 Secondary hyperparathyroidism of renal origin: Secondary | ICD-10-CM | POA: Diagnosis not present

## 2022-07-03 DIAGNOSIS — D631 Anemia in chronic kidney disease: Secondary | ICD-10-CM | POA: Diagnosis not present

## 2022-07-04 DIAGNOSIS — N2581 Secondary hyperparathyroidism of renal origin: Secondary | ICD-10-CM | POA: Diagnosis not present

## 2022-07-04 DIAGNOSIS — N186 End stage renal disease: Secondary | ICD-10-CM | POA: Diagnosis not present

## 2022-07-04 DIAGNOSIS — D631 Anemia in chronic kidney disease: Secondary | ICD-10-CM | POA: Diagnosis not present

## 2022-07-05 DIAGNOSIS — J302 Other seasonal allergic rhinitis: Secondary | ICD-10-CM | POA: Diagnosis not present

## 2022-07-05 DIAGNOSIS — D631 Anemia in chronic kidney disease: Secondary | ICD-10-CM | POA: Diagnosis not present

## 2022-07-05 DIAGNOSIS — N2581 Secondary hyperparathyroidism of renal origin: Secondary | ICD-10-CM | POA: Diagnosis not present

## 2022-07-05 DIAGNOSIS — N186 End stage renal disease: Secondary | ICD-10-CM | POA: Diagnosis not present

## 2022-07-06 DIAGNOSIS — D631 Anemia in chronic kidney disease: Secondary | ICD-10-CM | POA: Diagnosis not present

## 2022-07-06 DIAGNOSIS — N186 End stage renal disease: Secondary | ICD-10-CM | POA: Diagnosis not present

## 2022-07-06 DIAGNOSIS — N2581 Secondary hyperparathyroidism of renal origin: Secondary | ICD-10-CM | POA: Diagnosis not present

## 2022-07-07 DIAGNOSIS — N2581 Secondary hyperparathyroidism of renal origin: Secondary | ICD-10-CM | POA: Diagnosis not present

## 2022-07-07 DIAGNOSIS — D631 Anemia in chronic kidney disease: Secondary | ICD-10-CM | POA: Diagnosis not present

## 2022-07-07 DIAGNOSIS — N186 End stage renal disease: Secondary | ICD-10-CM | POA: Diagnosis not present

## 2022-07-08 DIAGNOSIS — N2581 Secondary hyperparathyroidism of renal origin: Secondary | ICD-10-CM | POA: Diagnosis not present

## 2022-07-08 DIAGNOSIS — D631 Anemia in chronic kidney disease: Secondary | ICD-10-CM | POA: Diagnosis not present

## 2022-07-08 DIAGNOSIS — N186 End stage renal disease: Secondary | ICD-10-CM | POA: Diagnosis not present

## 2022-07-09 DIAGNOSIS — N2581 Secondary hyperparathyroidism of renal origin: Secondary | ICD-10-CM | POA: Diagnosis not present

## 2022-07-09 DIAGNOSIS — D631 Anemia in chronic kidney disease: Secondary | ICD-10-CM | POA: Diagnosis not present

## 2022-07-09 DIAGNOSIS — N186 End stage renal disease: Secondary | ICD-10-CM | POA: Diagnosis not present

## 2022-07-10 DIAGNOSIS — D631 Anemia in chronic kidney disease: Secondary | ICD-10-CM | POA: Diagnosis not present

## 2022-07-10 DIAGNOSIS — N186 End stage renal disease: Secondary | ICD-10-CM | POA: Diagnosis not present

## 2022-07-10 DIAGNOSIS — N2581 Secondary hyperparathyroidism of renal origin: Secondary | ICD-10-CM | POA: Diagnosis not present

## 2022-07-11 DIAGNOSIS — N186 End stage renal disease: Secondary | ICD-10-CM | POA: Diagnosis not present

## 2022-07-11 DIAGNOSIS — D631 Anemia in chronic kidney disease: Secondary | ICD-10-CM | POA: Diagnosis not present

## 2022-07-11 DIAGNOSIS — N2581 Secondary hyperparathyroidism of renal origin: Secondary | ICD-10-CM | POA: Diagnosis not present

## 2022-07-12 DIAGNOSIS — N186 End stage renal disease: Secondary | ICD-10-CM | POA: Diagnosis not present

## 2022-07-12 DIAGNOSIS — D631 Anemia in chronic kidney disease: Secondary | ICD-10-CM | POA: Diagnosis not present

## 2022-07-12 DIAGNOSIS — N2581 Secondary hyperparathyroidism of renal origin: Secondary | ICD-10-CM | POA: Diagnosis not present

## 2022-07-13 DIAGNOSIS — N186 End stage renal disease: Secondary | ICD-10-CM | POA: Diagnosis not present

## 2022-07-13 DIAGNOSIS — D631 Anemia in chronic kidney disease: Secondary | ICD-10-CM | POA: Diagnosis not present

## 2022-07-13 DIAGNOSIS — N2581 Secondary hyperparathyroidism of renal origin: Secondary | ICD-10-CM | POA: Diagnosis not present

## 2022-07-14 DIAGNOSIS — N186 End stage renal disease: Secondary | ICD-10-CM | POA: Diagnosis not present

## 2022-07-14 DIAGNOSIS — D631 Anemia in chronic kidney disease: Secondary | ICD-10-CM | POA: Diagnosis not present

## 2022-07-14 DIAGNOSIS — N2581 Secondary hyperparathyroidism of renal origin: Secondary | ICD-10-CM | POA: Diagnosis not present

## 2022-07-15 DIAGNOSIS — N2581 Secondary hyperparathyroidism of renal origin: Secondary | ICD-10-CM | POA: Diagnosis not present

## 2022-07-15 DIAGNOSIS — N186 End stage renal disease: Secondary | ICD-10-CM | POA: Diagnosis not present

## 2022-07-15 DIAGNOSIS — D631 Anemia in chronic kidney disease: Secondary | ICD-10-CM | POA: Diagnosis not present

## 2022-07-16 DIAGNOSIS — N186 End stage renal disease: Secondary | ICD-10-CM | POA: Diagnosis not present

## 2022-07-16 DIAGNOSIS — N2581 Secondary hyperparathyroidism of renal origin: Secondary | ICD-10-CM | POA: Diagnosis not present

## 2022-07-16 DIAGNOSIS — D631 Anemia in chronic kidney disease: Secondary | ICD-10-CM | POA: Diagnosis not present

## 2022-07-17 DIAGNOSIS — N2581 Secondary hyperparathyroidism of renal origin: Secondary | ICD-10-CM | POA: Diagnosis not present

## 2022-07-17 DIAGNOSIS — N186 End stage renal disease: Secondary | ICD-10-CM | POA: Diagnosis not present

## 2022-07-17 DIAGNOSIS — D631 Anemia in chronic kidney disease: Secondary | ICD-10-CM | POA: Diagnosis not present

## 2022-07-18 DIAGNOSIS — N2581 Secondary hyperparathyroidism of renal origin: Secondary | ICD-10-CM | POA: Diagnosis not present

## 2022-07-18 DIAGNOSIS — D631 Anemia in chronic kidney disease: Secondary | ICD-10-CM | POA: Diagnosis not present

## 2022-07-18 DIAGNOSIS — N186 End stage renal disease: Secondary | ICD-10-CM | POA: Diagnosis not present

## 2022-07-19 DIAGNOSIS — D631 Anemia in chronic kidney disease: Secondary | ICD-10-CM | POA: Diagnosis not present

## 2022-07-19 DIAGNOSIS — N2581 Secondary hyperparathyroidism of renal origin: Secondary | ICD-10-CM | POA: Diagnosis not present

## 2022-07-19 DIAGNOSIS — N186 End stage renal disease: Secondary | ICD-10-CM | POA: Diagnosis not present

## 2022-07-20 DIAGNOSIS — N2581 Secondary hyperparathyroidism of renal origin: Secondary | ICD-10-CM | POA: Diagnosis not present

## 2022-07-20 DIAGNOSIS — D631 Anemia in chronic kidney disease: Secondary | ICD-10-CM | POA: Diagnosis not present

## 2022-07-20 DIAGNOSIS — N186 End stage renal disease: Secondary | ICD-10-CM | POA: Diagnosis not present

## 2022-07-21 DIAGNOSIS — N2581 Secondary hyperparathyroidism of renal origin: Secondary | ICD-10-CM | POA: Diagnosis not present

## 2022-07-21 DIAGNOSIS — N186 End stage renal disease: Secondary | ICD-10-CM | POA: Diagnosis not present

## 2022-07-21 DIAGNOSIS — D631 Anemia in chronic kidney disease: Secondary | ICD-10-CM | POA: Diagnosis not present

## 2022-07-22 DIAGNOSIS — D631 Anemia in chronic kidney disease: Secondary | ICD-10-CM | POA: Diagnosis not present

## 2022-07-22 DIAGNOSIS — N186 End stage renal disease: Secondary | ICD-10-CM | POA: Diagnosis not present

## 2022-07-22 DIAGNOSIS — Z992 Dependence on renal dialysis: Secondary | ICD-10-CM | POA: Diagnosis not present

## 2022-07-22 DIAGNOSIS — N2581 Secondary hyperparathyroidism of renal origin: Secondary | ICD-10-CM | POA: Diagnosis not present

## 2022-07-23 DIAGNOSIS — D631 Anemia in chronic kidney disease: Secondary | ICD-10-CM | POA: Diagnosis not present

## 2022-07-23 DIAGNOSIS — Z23 Encounter for immunization: Secondary | ICD-10-CM | POA: Diagnosis not present

## 2022-07-23 DIAGNOSIS — N2581 Secondary hyperparathyroidism of renal origin: Secondary | ICD-10-CM | POA: Diagnosis not present

## 2022-07-23 DIAGNOSIS — N186 End stage renal disease: Secondary | ICD-10-CM | POA: Diagnosis not present

## 2022-07-24 DIAGNOSIS — N186 End stage renal disease: Secondary | ICD-10-CM | POA: Diagnosis not present

## 2022-07-24 DIAGNOSIS — D631 Anemia in chronic kidney disease: Secondary | ICD-10-CM | POA: Diagnosis not present

## 2022-07-24 DIAGNOSIS — Z23 Encounter for immunization: Secondary | ICD-10-CM | POA: Diagnosis not present

## 2022-07-24 DIAGNOSIS — N2581 Secondary hyperparathyroidism of renal origin: Secondary | ICD-10-CM | POA: Diagnosis not present

## 2022-07-25 DIAGNOSIS — N2581 Secondary hyperparathyroidism of renal origin: Secondary | ICD-10-CM | POA: Diagnosis not present

## 2022-07-25 DIAGNOSIS — D631 Anemia in chronic kidney disease: Secondary | ICD-10-CM | POA: Diagnosis not present

## 2022-07-25 DIAGNOSIS — N186 End stage renal disease: Secondary | ICD-10-CM | POA: Diagnosis not present

## 2022-07-25 DIAGNOSIS — Z23 Encounter for immunization: Secondary | ICD-10-CM | POA: Diagnosis not present

## 2022-07-26 DIAGNOSIS — Z23 Encounter for immunization: Secondary | ICD-10-CM | POA: Diagnosis not present

## 2022-07-26 DIAGNOSIS — D631 Anemia in chronic kidney disease: Secondary | ICD-10-CM | POA: Diagnosis not present

## 2022-07-26 DIAGNOSIS — N186 End stage renal disease: Secondary | ICD-10-CM | POA: Diagnosis not present

## 2022-07-26 DIAGNOSIS — N2581 Secondary hyperparathyroidism of renal origin: Secondary | ICD-10-CM | POA: Diagnosis not present

## 2022-07-27 DIAGNOSIS — N186 End stage renal disease: Secondary | ICD-10-CM | POA: Diagnosis not present

## 2022-07-27 DIAGNOSIS — Z23 Encounter for immunization: Secondary | ICD-10-CM | POA: Diagnosis not present

## 2022-07-27 DIAGNOSIS — N2581 Secondary hyperparathyroidism of renal origin: Secondary | ICD-10-CM | POA: Diagnosis not present

## 2022-07-27 DIAGNOSIS — D631 Anemia in chronic kidney disease: Secondary | ICD-10-CM | POA: Diagnosis not present

## 2022-07-28 DIAGNOSIS — Z23 Encounter for immunization: Secondary | ICD-10-CM | POA: Diagnosis not present

## 2022-07-28 DIAGNOSIS — N186 End stage renal disease: Secondary | ICD-10-CM | POA: Diagnosis not present

## 2022-07-28 DIAGNOSIS — D631 Anemia in chronic kidney disease: Secondary | ICD-10-CM | POA: Diagnosis not present

## 2022-07-28 DIAGNOSIS — N2581 Secondary hyperparathyroidism of renal origin: Secondary | ICD-10-CM | POA: Diagnosis not present

## 2022-07-29 DIAGNOSIS — N186 End stage renal disease: Secondary | ICD-10-CM | POA: Diagnosis not present

## 2022-07-29 DIAGNOSIS — N2581 Secondary hyperparathyroidism of renal origin: Secondary | ICD-10-CM | POA: Diagnosis not present

## 2022-07-29 DIAGNOSIS — Z23 Encounter for immunization: Secondary | ICD-10-CM | POA: Diagnosis not present

## 2022-07-29 DIAGNOSIS — D631 Anemia in chronic kidney disease: Secondary | ICD-10-CM | POA: Diagnosis not present

## 2022-07-30 DIAGNOSIS — Z23 Encounter for immunization: Secondary | ICD-10-CM | POA: Diagnosis not present

## 2022-07-30 DIAGNOSIS — N186 End stage renal disease: Secondary | ICD-10-CM | POA: Diagnosis not present

## 2022-07-30 DIAGNOSIS — D631 Anemia in chronic kidney disease: Secondary | ICD-10-CM | POA: Diagnosis not present

## 2022-07-30 DIAGNOSIS — N2581 Secondary hyperparathyroidism of renal origin: Secondary | ICD-10-CM | POA: Diagnosis not present

## 2022-07-31 DIAGNOSIS — N2581 Secondary hyperparathyroidism of renal origin: Secondary | ICD-10-CM | POA: Diagnosis not present

## 2022-07-31 DIAGNOSIS — Z23 Encounter for immunization: Secondary | ICD-10-CM | POA: Diagnosis not present

## 2022-07-31 DIAGNOSIS — D631 Anemia in chronic kidney disease: Secondary | ICD-10-CM | POA: Diagnosis not present

## 2022-07-31 DIAGNOSIS — N186 End stage renal disease: Secondary | ICD-10-CM | POA: Diagnosis not present

## 2022-08-01 DIAGNOSIS — Z23 Encounter for immunization: Secondary | ICD-10-CM | POA: Diagnosis not present

## 2022-08-01 DIAGNOSIS — N2581 Secondary hyperparathyroidism of renal origin: Secondary | ICD-10-CM | POA: Diagnosis not present

## 2022-08-01 DIAGNOSIS — N186 End stage renal disease: Secondary | ICD-10-CM | POA: Diagnosis not present

## 2022-08-01 DIAGNOSIS — D631 Anemia in chronic kidney disease: Secondary | ICD-10-CM | POA: Diagnosis not present

## 2022-08-02 DIAGNOSIS — Z23 Encounter for immunization: Secondary | ICD-10-CM | POA: Diagnosis not present

## 2022-08-02 DIAGNOSIS — D631 Anemia in chronic kidney disease: Secondary | ICD-10-CM | POA: Diagnosis not present

## 2022-08-02 DIAGNOSIS — N2581 Secondary hyperparathyroidism of renal origin: Secondary | ICD-10-CM | POA: Diagnosis not present

## 2022-08-02 DIAGNOSIS — N186 End stage renal disease: Secondary | ICD-10-CM | POA: Diagnosis not present

## 2022-08-03 DIAGNOSIS — F319 Bipolar disorder, unspecified: Secondary | ICD-10-CM | POA: Diagnosis not present

## 2022-08-03 DIAGNOSIS — Z23 Encounter for immunization: Secondary | ICD-10-CM | POA: Diagnosis not present

## 2022-08-03 DIAGNOSIS — F29 Unspecified psychosis not due to a substance or known physiological condition: Secondary | ICD-10-CM | POA: Diagnosis not present

## 2022-08-03 DIAGNOSIS — N2581 Secondary hyperparathyroidism of renal origin: Secondary | ICD-10-CM | POA: Diagnosis not present

## 2022-08-03 DIAGNOSIS — F71 Moderate intellectual disabilities: Secondary | ICD-10-CM | POA: Diagnosis not present

## 2022-08-03 DIAGNOSIS — N186 End stage renal disease: Secondary | ICD-10-CM | POA: Diagnosis not present

## 2022-08-03 DIAGNOSIS — D631 Anemia in chronic kidney disease: Secondary | ICD-10-CM | POA: Diagnosis not present

## 2022-08-03 DIAGNOSIS — F250 Schizoaffective disorder, bipolar type: Secondary | ICD-10-CM | POA: Diagnosis not present

## 2022-08-04 DIAGNOSIS — N2581 Secondary hyperparathyroidism of renal origin: Secondary | ICD-10-CM | POA: Diagnosis not present

## 2022-08-04 DIAGNOSIS — D631 Anemia in chronic kidney disease: Secondary | ICD-10-CM | POA: Diagnosis not present

## 2022-08-04 DIAGNOSIS — N186 End stage renal disease: Secondary | ICD-10-CM | POA: Diagnosis not present

## 2022-08-04 DIAGNOSIS — Z23 Encounter for immunization: Secondary | ICD-10-CM | POA: Diagnosis not present

## 2022-08-05 DIAGNOSIS — D631 Anemia in chronic kidney disease: Secondary | ICD-10-CM | POA: Diagnosis not present

## 2022-08-05 DIAGNOSIS — N2581 Secondary hyperparathyroidism of renal origin: Secondary | ICD-10-CM | POA: Diagnosis not present

## 2022-08-05 DIAGNOSIS — N186 End stage renal disease: Secondary | ICD-10-CM | POA: Diagnosis not present

## 2022-08-05 DIAGNOSIS — Z23 Encounter for immunization: Secondary | ICD-10-CM | POA: Diagnosis not present

## 2022-08-06 DIAGNOSIS — N186 End stage renal disease: Secondary | ICD-10-CM | POA: Diagnosis not present

## 2022-08-06 DIAGNOSIS — D631 Anemia in chronic kidney disease: Secondary | ICD-10-CM | POA: Diagnosis not present

## 2022-08-06 DIAGNOSIS — N2581 Secondary hyperparathyroidism of renal origin: Secondary | ICD-10-CM | POA: Diagnosis not present

## 2022-08-06 DIAGNOSIS — Z23 Encounter for immunization: Secondary | ICD-10-CM | POA: Diagnosis not present

## 2022-08-07 DIAGNOSIS — N186 End stage renal disease: Secondary | ICD-10-CM | POA: Diagnosis not present

## 2022-08-07 DIAGNOSIS — D631 Anemia in chronic kidney disease: Secondary | ICD-10-CM | POA: Diagnosis not present

## 2022-08-07 DIAGNOSIS — N2581 Secondary hyperparathyroidism of renal origin: Secondary | ICD-10-CM | POA: Diagnosis not present

## 2022-08-07 DIAGNOSIS — Z23 Encounter for immunization: Secondary | ICD-10-CM | POA: Diagnosis not present

## 2022-08-08 DIAGNOSIS — Z23 Encounter for immunization: Secondary | ICD-10-CM | POA: Diagnosis not present

## 2022-08-08 DIAGNOSIS — D631 Anemia in chronic kidney disease: Secondary | ICD-10-CM | POA: Diagnosis not present

## 2022-08-08 DIAGNOSIS — N186 End stage renal disease: Secondary | ICD-10-CM | POA: Diagnosis not present

## 2022-08-08 DIAGNOSIS — N2581 Secondary hyperparathyroidism of renal origin: Secondary | ICD-10-CM | POA: Diagnosis not present

## 2022-08-09 DIAGNOSIS — N186 End stage renal disease: Secondary | ICD-10-CM | POA: Diagnosis not present

## 2022-08-09 DIAGNOSIS — Z23 Encounter for immunization: Secondary | ICD-10-CM | POA: Diagnosis not present

## 2022-08-09 DIAGNOSIS — N2581 Secondary hyperparathyroidism of renal origin: Secondary | ICD-10-CM | POA: Diagnosis not present

## 2022-08-09 DIAGNOSIS — D631 Anemia in chronic kidney disease: Secondary | ICD-10-CM | POA: Diagnosis not present

## 2022-08-10 DIAGNOSIS — N2581 Secondary hyperparathyroidism of renal origin: Secondary | ICD-10-CM | POA: Diagnosis not present

## 2022-08-10 DIAGNOSIS — N186 End stage renal disease: Secondary | ICD-10-CM | POA: Diagnosis not present

## 2022-08-10 DIAGNOSIS — D631 Anemia in chronic kidney disease: Secondary | ICD-10-CM | POA: Diagnosis not present

## 2022-08-10 DIAGNOSIS — Z23 Encounter for immunization: Secondary | ICD-10-CM | POA: Diagnosis not present

## 2022-08-11 DIAGNOSIS — N2581 Secondary hyperparathyroidism of renal origin: Secondary | ICD-10-CM | POA: Diagnosis not present

## 2022-08-11 DIAGNOSIS — Z23 Encounter for immunization: Secondary | ICD-10-CM | POA: Diagnosis not present

## 2022-08-11 DIAGNOSIS — D631 Anemia in chronic kidney disease: Secondary | ICD-10-CM | POA: Diagnosis not present

## 2022-08-11 DIAGNOSIS — N186 End stage renal disease: Secondary | ICD-10-CM | POA: Diagnosis not present

## 2022-08-12 DIAGNOSIS — N2581 Secondary hyperparathyroidism of renal origin: Secondary | ICD-10-CM | POA: Diagnosis not present

## 2022-08-12 DIAGNOSIS — Z23 Encounter for immunization: Secondary | ICD-10-CM | POA: Diagnosis not present

## 2022-08-12 DIAGNOSIS — N186 End stage renal disease: Secondary | ICD-10-CM | POA: Diagnosis not present

## 2022-08-12 DIAGNOSIS — D631 Anemia in chronic kidney disease: Secondary | ICD-10-CM | POA: Diagnosis not present

## 2022-08-13 DIAGNOSIS — Z23 Encounter for immunization: Secondary | ICD-10-CM | POA: Diagnosis not present

## 2022-08-13 DIAGNOSIS — D631 Anemia in chronic kidney disease: Secondary | ICD-10-CM | POA: Diagnosis not present

## 2022-08-13 DIAGNOSIS — N2581 Secondary hyperparathyroidism of renal origin: Secondary | ICD-10-CM | POA: Diagnosis not present

## 2022-08-13 DIAGNOSIS — N186 End stage renal disease: Secondary | ICD-10-CM | POA: Diagnosis not present

## 2022-08-14 DIAGNOSIS — N2581 Secondary hyperparathyroidism of renal origin: Secondary | ICD-10-CM | POA: Diagnosis not present

## 2022-08-14 DIAGNOSIS — Z23 Encounter for immunization: Secondary | ICD-10-CM | POA: Diagnosis not present

## 2022-08-14 DIAGNOSIS — D631 Anemia in chronic kidney disease: Secondary | ICD-10-CM | POA: Diagnosis not present

## 2022-08-14 DIAGNOSIS — N186 End stage renal disease: Secondary | ICD-10-CM | POA: Diagnosis not present

## 2022-08-15 DIAGNOSIS — N186 End stage renal disease: Secondary | ICD-10-CM | POA: Diagnosis not present

## 2022-08-15 DIAGNOSIS — N2581 Secondary hyperparathyroidism of renal origin: Secondary | ICD-10-CM | POA: Diagnosis not present

## 2022-08-15 DIAGNOSIS — D631 Anemia in chronic kidney disease: Secondary | ICD-10-CM | POA: Diagnosis not present

## 2022-08-15 DIAGNOSIS — Z23 Encounter for immunization: Secondary | ICD-10-CM | POA: Diagnosis not present

## 2022-08-16 DIAGNOSIS — N186 End stage renal disease: Secondary | ICD-10-CM | POA: Diagnosis not present

## 2022-08-16 DIAGNOSIS — Z23 Encounter for immunization: Secondary | ICD-10-CM | POA: Diagnosis not present

## 2022-08-16 DIAGNOSIS — D631 Anemia in chronic kidney disease: Secondary | ICD-10-CM | POA: Diagnosis not present

## 2022-08-16 DIAGNOSIS — N2581 Secondary hyperparathyroidism of renal origin: Secondary | ICD-10-CM | POA: Diagnosis not present

## 2022-08-17 DIAGNOSIS — Z23 Encounter for immunization: Secondary | ICD-10-CM | POA: Diagnosis not present

## 2022-08-17 DIAGNOSIS — D631 Anemia in chronic kidney disease: Secondary | ICD-10-CM | POA: Diagnosis not present

## 2022-08-17 DIAGNOSIS — N2581 Secondary hyperparathyroidism of renal origin: Secondary | ICD-10-CM | POA: Diagnosis not present

## 2022-08-17 DIAGNOSIS — N186 End stage renal disease: Secondary | ICD-10-CM | POA: Diagnosis not present

## 2022-08-18 DIAGNOSIS — Z23 Encounter for immunization: Secondary | ICD-10-CM | POA: Diagnosis not present

## 2022-08-18 DIAGNOSIS — D631 Anemia in chronic kidney disease: Secondary | ICD-10-CM | POA: Diagnosis not present

## 2022-08-18 DIAGNOSIS — N186 End stage renal disease: Secondary | ICD-10-CM | POA: Diagnosis not present

## 2022-08-18 DIAGNOSIS — N2581 Secondary hyperparathyroidism of renal origin: Secondary | ICD-10-CM | POA: Diagnosis not present

## 2022-08-19 DIAGNOSIS — N2581 Secondary hyperparathyroidism of renal origin: Secondary | ICD-10-CM | POA: Diagnosis not present

## 2022-08-19 DIAGNOSIS — D631 Anemia in chronic kidney disease: Secondary | ICD-10-CM | POA: Diagnosis not present

## 2022-08-19 DIAGNOSIS — N186 End stage renal disease: Secondary | ICD-10-CM | POA: Diagnosis not present

## 2022-08-19 DIAGNOSIS — Z23 Encounter for immunization: Secondary | ICD-10-CM | POA: Diagnosis not present

## 2022-08-20 DIAGNOSIS — N186 End stage renal disease: Secondary | ICD-10-CM | POA: Diagnosis not present

## 2022-08-20 DIAGNOSIS — N2581 Secondary hyperparathyroidism of renal origin: Secondary | ICD-10-CM | POA: Diagnosis not present

## 2022-08-20 DIAGNOSIS — D631 Anemia in chronic kidney disease: Secondary | ICD-10-CM | POA: Diagnosis not present

## 2022-08-20 DIAGNOSIS — Z23 Encounter for immunization: Secondary | ICD-10-CM | POA: Diagnosis not present

## 2022-08-21 DIAGNOSIS — Z23 Encounter for immunization: Secondary | ICD-10-CM | POA: Diagnosis not present

## 2022-08-21 DIAGNOSIS — N186 End stage renal disease: Secondary | ICD-10-CM | POA: Diagnosis not present

## 2022-08-21 DIAGNOSIS — D631 Anemia in chronic kidney disease: Secondary | ICD-10-CM | POA: Diagnosis not present

## 2022-08-21 DIAGNOSIS — N2581 Secondary hyperparathyroidism of renal origin: Secondary | ICD-10-CM | POA: Diagnosis not present

## 2022-08-22 DIAGNOSIS — Z992 Dependence on renal dialysis: Secondary | ICD-10-CM | POA: Diagnosis not present

## 2022-08-22 DIAGNOSIS — D631 Anemia in chronic kidney disease: Secondary | ICD-10-CM | POA: Diagnosis not present

## 2022-08-22 DIAGNOSIS — Z23 Encounter for immunization: Secondary | ICD-10-CM | POA: Diagnosis not present

## 2022-08-22 DIAGNOSIS — N186 End stage renal disease: Secondary | ICD-10-CM | POA: Diagnosis not present

## 2022-08-22 DIAGNOSIS — N2581 Secondary hyperparathyroidism of renal origin: Secondary | ICD-10-CM | POA: Diagnosis not present

## 2022-08-23 DIAGNOSIS — N186 End stage renal disease: Secondary | ICD-10-CM | POA: Diagnosis not present

## 2022-08-23 DIAGNOSIS — N2581 Secondary hyperparathyroidism of renal origin: Secondary | ICD-10-CM | POA: Diagnosis not present

## 2022-08-24 DIAGNOSIS — N186 End stage renal disease: Secondary | ICD-10-CM | POA: Diagnosis not present

## 2022-08-24 DIAGNOSIS — N2581 Secondary hyperparathyroidism of renal origin: Secondary | ICD-10-CM | POA: Diagnosis not present

## 2022-08-25 ENCOUNTER — Encounter: Payer: Self-pay | Admitting: Nurse Practitioner

## 2022-08-25 DIAGNOSIS — N186 End stage renal disease: Secondary | ICD-10-CM | POA: Diagnosis not present

## 2022-08-25 DIAGNOSIS — N2581 Secondary hyperparathyroidism of renal origin: Secondary | ICD-10-CM | POA: Diagnosis not present

## 2022-08-26 DIAGNOSIS — N2581 Secondary hyperparathyroidism of renal origin: Secondary | ICD-10-CM | POA: Diagnosis not present

## 2022-08-26 DIAGNOSIS — N186 End stage renal disease: Secondary | ICD-10-CM | POA: Diagnosis not present

## 2022-08-27 DIAGNOSIS — N2581 Secondary hyperparathyroidism of renal origin: Secondary | ICD-10-CM | POA: Diagnosis not present

## 2022-08-27 DIAGNOSIS — N186 End stage renal disease: Secondary | ICD-10-CM | POA: Diagnosis not present

## 2022-08-28 ENCOUNTER — Inpatient Hospital Stay (HOSPITAL_COMMUNITY): Payer: Medicare Other | Admitting: Certified Registered"

## 2022-08-28 ENCOUNTER — Emergency Department (HOSPITAL_COMMUNITY): Payer: Medicare Other

## 2022-08-28 ENCOUNTER — Other Ambulatory Visit: Payer: Self-pay

## 2022-08-28 ENCOUNTER — Inpatient Hospital Stay (HOSPITAL_COMMUNITY)
Admission: EM | Admit: 2022-08-28 | Discharge: 2022-09-22 | DRG: 853 | Disposition: E | Payer: Medicare Other | Attending: Pulmonary Disease | Admitting: Pulmonary Disease

## 2022-08-28 ENCOUNTER — Encounter (HOSPITAL_COMMUNITY): Admission: EM | Disposition: E | Payer: Self-pay | Source: Home / Self Care | Attending: Pulmonary Disease

## 2022-08-28 ENCOUNTER — Encounter (HOSPITAL_COMMUNITY): Payer: Self-pay | Admitting: Oncology

## 2022-08-28 DIAGNOSIS — E785 Hyperlipidemia, unspecified: Secondary | ICD-10-CM | POA: Diagnosis not present

## 2022-08-28 DIAGNOSIS — R188 Other ascites: Secondary | ICD-10-CM | POA: Diagnosis not present

## 2022-08-28 DIAGNOSIS — K219 Gastro-esophageal reflux disease without esophagitis: Secondary | ICD-10-CM | POA: Diagnosis not present

## 2022-08-28 DIAGNOSIS — F259 Schizoaffective disorder, unspecified: Secondary | ICD-10-CM | POA: Diagnosis present

## 2022-08-28 DIAGNOSIS — R918 Other nonspecific abnormal finding of lung field: Secondary | ICD-10-CM | POA: Diagnosis not present

## 2022-08-28 DIAGNOSIS — E039 Hypothyroidism, unspecified: Secondary | ICD-10-CM | POA: Diagnosis present

## 2022-08-28 DIAGNOSIS — Z992 Dependence on renal dialysis: Secondary | ICD-10-CM

## 2022-08-28 DIAGNOSIS — F79 Unspecified intellectual disabilities: Secondary | ICD-10-CM | POA: Diagnosis present

## 2022-08-28 DIAGNOSIS — I120 Hypertensive chronic kidney disease with stage 5 chronic kidney disease or end stage renal disease: Secondary | ICD-10-CM

## 2022-08-28 DIAGNOSIS — K6389 Other specified diseases of intestine: Secondary | ICD-10-CM | POA: Diagnosis not present

## 2022-08-28 DIAGNOSIS — Z888 Allergy status to other drugs, medicaments and biological substances status: Secondary | ICD-10-CM

## 2022-08-28 DIAGNOSIS — Z515 Encounter for palliative care: Secondary | ICD-10-CM

## 2022-08-28 DIAGNOSIS — I959 Hypotension, unspecified: Secondary | ICD-10-CM | POA: Diagnosis not present

## 2022-08-28 DIAGNOSIS — A419 Sepsis, unspecified organism: Principal | ICD-10-CM | POA: Diagnosis present

## 2022-08-28 DIAGNOSIS — R578 Other shock: Secondary | ICD-10-CM | POA: Diagnosis present

## 2022-08-28 DIAGNOSIS — N186 End stage renal disease: Secondary | ICD-10-CM | POA: Diagnosis present

## 2022-08-28 DIAGNOSIS — K55022 Diffuse acute infarction of small intestine: Secondary | ICD-10-CM | POA: Diagnosis present

## 2022-08-28 DIAGNOSIS — K55029 Acute infarction of small intestine, extent unspecified: Secondary | ICD-10-CM | POA: Diagnosis not present

## 2022-08-28 DIAGNOSIS — Z79899 Other long term (current) drug therapy: Secondary | ICD-10-CM

## 2022-08-28 DIAGNOSIS — Z66 Do not resuscitate: Secondary | ICD-10-CM | POA: Diagnosis present

## 2022-08-28 DIAGNOSIS — Z1152 Encounter for screening for COVID-19: Secondary | ICD-10-CM | POA: Diagnosis not present

## 2022-08-28 DIAGNOSIS — N2581 Secondary hyperparathyroidism of renal origin: Secondary | ICD-10-CM | POA: Diagnosis not present

## 2022-08-28 DIAGNOSIS — E162 Hypoglycemia, unspecified: Secondary | ICD-10-CM | POA: Diagnosis not present

## 2022-08-28 DIAGNOSIS — Z79890 Hormone replacement therapy (postmenopausal): Secondary | ICD-10-CM

## 2022-08-28 DIAGNOSIS — J9601 Acute respiratory failure with hypoxia: Secondary | ICD-10-CM | POA: Diagnosis present

## 2022-08-28 DIAGNOSIS — G40909 Epilepsy, unspecified, not intractable, without status epilepticus: Secondary | ICD-10-CM | POA: Diagnosis present

## 2022-08-28 DIAGNOSIS — F319 Bipolar disorder, unspecified: Secondary | ICD-10-CM | POA: Diagnosis present

## 2022-08-28 DIAGNOSIS — R6521 Severe sepsis with septic shock: Secondary | ICD-10-CM | POA: Diagnosis present

## 2022-08-28 DIAGNOSIS — R109 Unspecified abdominal pain: Secondary | ICD-10-CM | POA: Diagnosis not present

## 2022-08-28 DIAGNOSIS — R0902 Hypoxemia: Secondary | ICD-10-CM | POA: Diagnosis not present

## 2022-08-28 DIAGNOSIS — D72829 Elevated white blood cell count, unspecified: Secondary | ICD-10-CM | POA: Diagnosis not present

## 2022-08-28 DIAGNOSIS — D631 Anemia in chronic kidney disease: Secondary | ICD-10-CM | POA: Diagnosis not present

## 2022-08-28 DIAGNOSIS — Z4682 Encounter for fitting and adjustment of non-vascular catheter: Secondary | ICD-10-CM | POA: Diagnosis not present

## 2022-08-28 DIAGNOSIS — E876 Hypokalemia: Secondary | ICD-10-CM | POA: Diagnosis present

## 2022-08-28 DIAGNOSIS — R0689 Other abnormalities of breathing: Secondary | ICD-10-CM | POA: Diagnosis not present

## 2022-08-28 DIAGNOSIS — Z8249 Family history of ischemic heart disease and other diseases of the circulatory system: Secondary | ICD-10-CM

## 2022-08-28 DIAGNOSIS — K55059 Acute (reversible) ischemia of intestine, part and extent unspecified: Secondary | ICD-10-CM | POA: Diagnosis not present

## 2022-08-28 DIAGNOSIS — K55069 Acute infarction of intestine, part and extent unspecified: Secondary | ICD-10-CM | POA: Diagnosis not present

## 2022-08-28 DIAGNOSIS — R000 Tachycardia, unspecified: Secondary | ICD-10-CM | POA: Diagnosis not present

## 2022-08-28 DIAGNOSIS — R001 Bradycardia, unspecified: Secondary | ICD-10-CM | POA: Diagnosis not present

## 2022-08-28 DIAGNOSIS — R4182 Altered mental status, unspecified: Secondary | ICD-10-CM | POA: Diagnosis present

## 2022-08-28 HISTORY — PX: MINOR REMOVAL OF PERITONEAL DIALYSIS CATHETER: SHX6397

## 2022-08-28 HISTORY — PX: LAPAROTOMY: SHX154

## 2022-08-28 LAB — I-STAT CHEM 8, ED
BUN: 34 mg/dL — ABNORMAL HIGH (ref 6–20)
Calcium, Ion: 0.97 mmol/L — ABNORMAL LOW (ref 1.15–1.40)
Chloride: 98 mmol/L (ref 98–111)
Creatinine, Ser: 4.6 mg/dL — ABNORMAL HIGH (ref 0.44–1.00)
Glucose, Bld: 81 mg/dL (ref 70–99)
HCT: 31 % — ABNORMAL LOW (ref 36.0–46.0)
Hemoglobin: 10.5 g/dL — ABNORMAL LOW (ref 12.0–15.0)
Potassium: 3 mmol/L — ABNORMAL LOW (ref 3.5–5.1)
Sodium: 135 mmol/L (ref 135–145)
TCO2: 22 mmol/L (ref 22–32)

## 2022-08-28 LAB — TYPE AND SCREEN
ABO/RH(D): O POS
Antibody Screen: NEGATIVE

## 2022-08-28 LAB — URINALYSIS, ROUTINE W REFLEX MICROSCOPIC
Bilirubin Urine: NEGATIVE
Glucose, UA: NEGATIVE mg/dL
Ketones, ur: NEGATIVE mg/dL
Nitrite: NEGATIVE
Protein, ur: NEGATIVE mg/dL
Specific Gravity, Urine: 1.01 (ref 1.005–1.030)
pH: 5 (ref 5.0–8.0)

## 2022-08-28 LAB — CBC WITH DIFFERENTIAL/PLATELET
Abs Immature Granulocytes: 0.29 10*3/uL — ABNORMAL HIGH (ref 0.00–0.07)
Basophils Absolute: 0.1 10*3/uL (ref 0.0–0.1)
Basophils Relative: 1 %
Eosinophils Absolute: 0 10*3/uL (ref 0.0–0.5)
Eosinophils Relative: 0 %
HCT: 33.4 % — ABNORMAL LOW (ref 36.0–46.0)
Hemoglobin: 10.3 g/dL — ABNORMAL LOW (ref 12.0–15.0)
Immature Granulocytes: 2 %
Lymphocytes Relative: 6 %
Lymphs Abs: 1.1 10*3/uL (ref 0.7–4.0)
MCH: 31.6 pg (ref 26.0–34.0)
MCHC: 30.8 g/dL (ref 30.0–36.0)
MCV: 102.5 fL — ABNORMAL HIGH (ref 80.0–100.0)
Monocytes Absolute: 1.2 10*3/uL — ABNORMAL HIGH (ref 0.1–1.0)
Monocytes Relative: 6 %
Neutro Abs: 15.2 10*3/uL — ABNORMAL HIGH (ref 1.7–7.7)
Neutrophils Relative %: 85 %
Platelets: 384 10*3/uL (ref 150–400)
RBC: 3.26 MIL/uL — ABNORMAL LOW (ref 3.87–5.11)
RDW: 14.4 % (ref 11.5–15.5)
WBC Morphology: INCREASED
WBC: 17.9 10*3/uL — ABNORMAL HIGH (ref 4.0–10.5)
nRBC: 0.1 % (ref 0.0–0.2)

## 2022-08-28 LAB — APTT: aPTT: 37 seconds — ABNORMAL HIGH (ref 24–36)

## 2022-08-28 LAB — COMPREHENSIVE METABOLIC PANEL
ALT: 11 U/L (ref 0–44)
AST: 37 U/L (ref 15–41)
Albumin: <1.5 g/dL — ABNORMAL LOW (ref 3.5–5.0)
Alkaline Phosphatase: 310 U/L — ABNORMAL HIGH (ref 38–126)
Anion gap: 13 (ref 5–15)
BUN: 43 mg/dL — ABNORMAL HIGH (ref 6–20)
CO2: 22 mmol/L (ref 22–32)
Calcium: 7 mg/dL — ABNORMAL LOW (ref 8.9–10.3)
Chloride: 100 mmol/L (ref 98–111)
Creatinine, Ser: 4.73 mg/dL — ABNORMAL HIGH (ref 0.44–1.00)
GFR, Estimated: 10 mL/min — ABNORMAL LOW (ref >60)
Glucose, Bld: 96 mg/dL (ref 70–99)
Potassium: 3 mmol/L — ABNORMAL LOW (ref 3.5–5.1)
Sodium: 135 mmol/L (ref 135–145)
Total Bilirubin: 0.5 mg/dL (ref 0.3–1.2)
Total Protein: 5.1 g/dL — ABNORMAL LOW (ref 6.5–8.1)

## 2022-08-28 LAB — PROTIME-INR
INR: 1.7 — ABNORMAL HIGH (ref 0.8–1.2)
Prothrombin Time: 19.4 seconds — ABNORMAL HIGH (ref 11.4–15.2)

## 2022-08-28 LAB — RESP PANEL BY RT-PCR (FLU A&B, COVID) ARPGX2
Influenza A by PCR: NEGATIVE
Influenza B by PCR: NEGATIVE
SARS Coronavirus 2 by RT PCR: NEGATIVE

## 2022-08-28 LAB — MAGNESIUM: Magnesium: 1.3 mg/dL — ABNORMAL LOW (ref 1.7–2.4)

## 2022-08-28 LAB — LACTIC ACID, PLASMA
Lactic Acid, Venous: 6.2 mmol/L (ref 0.5–1.9)
Lactic Acid, Venous: 6.2 mmol/L (ref 0.5–1.9)

## 2022-08-28 LAB — LIPASE, BLOOD: Lipase: 19 U/L (ref 11–51)

## 2022-08-28 SURGERY — LAPAROTOMY, EXPLORATORY
Anesthesia: General | Site: Abdomen | Laterality: Right

## 2022-08-28 MED ORDER — LACTATED RINGERS IV BOLUS (SEPSIS)
1000.0000 mL | Freq: Once | INTRAVENOUS | Status: AC
  Administered 2022-08-28: 1000 mL via INTRAVENOUS

## 2022-08-28 MED ORDER — PHENYLEPHRINE HCL (PRESSORS) 10 MG/ML IV SOLN
INTRAVENOUS | Status: AC
  Filled 2022-08-28: qty 1

## 2022-08-28 MED ORDER — SODIUM CHLORIDE 0.9 % IV SOLN
2.0000 g | Freq: Once | INTRAVENOUS | Status: AC
  Administered 2022-08-28: 2 g via INTRAVENOUS
  Filled 2022-08-28: qty 12.5

## 2022-08-28 MED ORDER — FENTANYL CITRATE (PF) 250 MCG/5ML IJ SOLN
INTRAMUSCULAR | Status: AC
  Filled 2022-08-28: qty 5

## 2022-08-28 MED ORDER — PIPERACILLIN-TAZOBACTAM 3.375 G IVPB: 3.3750 g | Freq: Once | INTRAVENOUS | Status: DC

## 2022-08-28 MED ORDER — VASOPRESSIN 20 UNITS/100 ML INFUSION FOR SHOCK
0.0000 [IU]/min | Freq: Once | INTRAVENOUS | Status: AC
  Administered 2022-08-29: 0.03 [IU]/min via INTRAVENOUS
  Filled 2022-08-28: qty 100

## 2022-08-28 MED ORDER — FENTANYL CITRATE (PF) 250 MCG/5ML IJ SOLN
INTRAMUSCULAR | Status: DC | PRN
  Administered 2022-08-28 (×3): 50 ug via INTRAVENOUS

## 2022-08-28 MED ORDER — LIDOCAINE 2% (20 MG/ML) 5 ML SYRINGE
INTRAMUSCULAR | Status: DC | PRN
  Administered 2022-08-28: 60 mg via INTRAVENOUS

## 2022-08-28 MED ORDER — VASOPRESSIN 20 UNIT/ML IV SOLN
INTRAVENOUS | Status: AC
  Filled 2022-08-28: qty 1

## 2022-08-28 MED ORDER — POTASSIUM CHLORIDE 10 MEQ/100ML IV SOLN
10.0000 meq | INTRAVENOUS | Status: DC
  Administered 2022-08-28: 10 meq via INTRAVENOUS
  Filled 2022-08-28: qty 100

## 2022-08-28 MED ORDER — PROPOFOL 10 MG/ML IV BOLUS
INTRAVENOUS | Status: AC
  Filled 2022-08-28: qty 20

## 2022-08-28 MED ORDER — ALBUMIN HUMAN 5 % IV SOLN: INTRAVENOUS | Status: DC | PRN

## 2022-08-28 MED ORDER — NOREPINEPHRINE 4 MG/250ML-% IV SOLN
0.0000 ug/min | INTRAVENOUS | Status: DC
  Administered 2022-08-28: 2 ug/min via INTRAVENOUS
  Administered 2022-08-29: 40 ug/min via INTRAVENOUS
  Administered 2022-08-29: 50 ug/min via INTRAVENOUS
  Administered 2022-08-29: 40 ug/min via INTRAVENOUS
  Administered 2022-08-29 (×3): 50 ug/min via INTRAVENOUS
  Filled 2022-08-28 (×2): qty 250
  Filled 2022-08-28: qty 500
  Filled 2022-08-28 (×2): qty 250

## 2022-08-28 MED ORDER — SUCCINYLCHOLINE CHLORIDE 200 MG/10ML IV SOSY
PREFILLED_SYRINGE | INTRAVENOUS | Status: DC | PRN
  Administered 2022-08-28: 100 mg via INTRAVENOUS

## 2022-08-28 MED ORDER — VASOPRESSIN 20 UNIT/ML IV SOLN
INTRAVENOUS | Status: DC | PRN
  Administered 2022-08-28: 2 [IU] via INTRAVENOUS
  Administered 2022-08-28 (×2): 3 [IU] via INTRAVENOUS
  Administered 2022-08-28: 1 [IU] via INTRAVENOUS
  Administered 2022-08-28 (×2): 2 [IU] via INTRAVENOUS

## 2022-08-28 MED ORDER — 0.9 % SODIUM CHLORIDE (POUR BTL) OPTIME
TOPICAL | Status: DC | PRN
  Administered 2022-08-28: 1000 mL

## 2022-08-28 MED ORDER — MIDAZOLAM HCL 2 MG/2ML IJ SOLN
INTRAMUSCULAR | Status: DC | PRN
  Administered 2022-08-28: 1 mg via INTRAVENOUS

## 2022-08-28 MED ORDER — METRONIDAZOLE 500 MG/100ML IV SOLN
500.0000 mg | Freq: Once | INTRAVENOUS | Status: AC
  Administered 2022-08-28: 500 mg via INTRAVENOUS
  Filled 2022-08-28: qty 100

## 2022-08-28 MED ORDER — LACTATED RINGERS IV SOLN
INTRAVENOUS | Status: DC
  Administered 2022-08-29: 150 mL/h via INTRAVENOUS

## 2022-08-28 MED ORDER — MIDAZOLAM HCL 2 MG/2ML IJ SOLN
INTRAMUSCULAR | Status: AC
  Filled 2022-08-28: qty 2

## 2022-08-28 MED ORDER — ETOMIDATE 2 MG/ML IV SOLN
INTRAVENOUS | Status: DC | PRN
  Administered 2022-08-28: 16 mg via INTRAVENOUS

## 2022-08-28 MED ORDER — ROCURONIUM BROMIDE 10 MG/ML (PF) SYRINGE
PREFILLED_SYRINGE | INTRAVENOUS | Status: DC | PRN
  Administered 2022-08-28: 60 mg via INTRAVENOUS

## 2022-08-28 SURGICAL SUPPLY — 25 items
BAG COUNTER SPONGE SURGICOUNT (BAG) IMPLANT
CHLORAPREP W/TINT 26 (MISCELLANEOUS) ×2 IMPLANT
DRAPE LAPAROSCOPIC ABDOMINAL (DRAPES) ×2 IMPLANT
ELECT REM PT RETURN 15FT ADLT (MISCELLANEOUS) ×2 IMPLANT
GAUZE PAD ABD 8X10 STRL (GAUZE/BANDAGES/DRESSINGS) IMPLANT
GAUZE SPONGE 4X4 12PLY STRL (GAUZE/BANDAGES/DRESSINGS) IMPLANT
GLOVE BIO SURGEON STRL SZ7.5 (GLOVE) ×2 IMPLANT
GLOVE BIO SURGEON STRL SZ8 (GLOVE) IMPLANT
GLOVE BIOGEL PI IND STRL 7.0 (GLOVE) ×2 IMPLANT
GLOVE BIOGEL PI IND STRL 7.5 (GLOVE) IMPLANT
GLOVE BIOGEL PI IND STRL 8 (GLOVE) IMPLANT
GOWN STRL REUS W/ TWL XL LVL3 (GOWN DISPOSABLE) ×4 IMPLANT
GOWN STRL REUS W/TWL XL LVL3 (GOWN DISPOSABLE) ×8
KIT BASIN OR (CUSTOM PROCEDURE TRAY) ×2 IMPLANT
KIT TURNOVER KIT A (KITS) IMPLANT
LIGASURE IMPACT 36 18CM CVD LR (INSTRUMENTS) IMPLANT
NS IRRIG 1000ML POUR BTL (IV SOLUTION) ×2 IMPLANT
PACK GENERAL/GYN (CUSTOM PROCEDURE TRAY) ×2 IMPLANT
SHEARS HARMONIC ACE PLUS 36CM (ENDOMECHANICALS) IMPLANT
STAPLER PROXIMATE 75MM BLUE (STAPLE) IMPLANT
STAPLER VISISTAT 35W (STAPLE) ×2 IMPLANT
SUT PDS AB 1 CTX 36 (SUTURE) IMPLANT
TAPE CLOTH SURG 6X10 WHT LF (GAUZE/BANDAGES/DRESSINGS) IMPLANT
TOWEL OR 17X26 10 PK STRL BLUE (TOWEL DISPOSABLE) ×2 IMPLANT
TRAY FOLEY MTR SLVR 16FR STAT (SET/KITS/TRAYS/PACK) ×2 IMPLANT

## 2022-08-28 NOTE — Progress Notes (Signed)
Patient ID: FEMALE Jane [name], female   DOB: Feb 16, 1966, 56 y.o.   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  This is incompatible with life.  At the end of the procedure I discussed the findings with the patient's sister and brother-in-law.  We discussed DNR status and comfort care.  They would currently like the patient to remain intubated to keep her comfortable and are contacting family including the patient's father so they can hopefully see the patient before her demise.   Unfortunately, there are no ICU beds at [facility] so the patient will likely be transferred to [facility] for the intensive care unit there.

## 2022-08-28 NOTE — Anesthesia Procedure Notes (Signed)
Arterial Line Insertion Start/End11/18/2023 11:00 PM, 08/27/2022 11:04 PM Performed by: Josephine Igo, MD, anesthesiologist  Patient location: OR. Preanesthetic checklist: patient identified, IV checked, site marked, risks and benefits discussed, surgical consent, monitors and equipment checked, pre-op evaluation, timeout performed and anesthesia consent Left, radial was placed Catheter size: 20 G Hand hygiene performed  and maximum sterile barriers used   Attempts: 1 Procedure performed without using ultrasound guided technique. Following insertion, Biopatch. Post procedure assessment: unchanged  Patient tolerated the procedure well with no immediate complications.

## 2022-08-28 NOTE — ED Notes (Signed)
Pt spo2 reading was found to be in the low 80s. Pt placed on Central and titrated until reading was above 95% . Pt on 6L o2 [place]

## 2022-08-28 NOTE — Progress Notes (Signed)
Pt tx from ER . Was on K+ 1st bag finished, levophed was on 11 titrated upto 31mc. To keep BP. Temp was 35.2 to 35.5 via bladder sensor. Pt oriented x1 to self. Clear but delay responded. Peritoneal cath '@belly'$  locked.

## 2022-08-28 NOTE — Sepsis Progress Note (Signed)
Following for sepsis monitoring. All sepsis interventions completed appropriately. Plan to go to the OR emergently for exploration.

## 2022-08-28 NOTE — ED Notes (Signed)
Informed Dr. Melina Copa of Lactic acid of 6.2

## 2022-08-28 NOTE — Progress Notes (Signed)
After two attempts by two different RT's, we were unsuccessful at drawing ABG sample

## 2022-08-28 NOTE — Consult Note (Addendum)
Reason for Consult:ischemic bowel Referring Physician: Aletta Edouard, MD  Jane [name] is an 56 y.o. female.  HPI: This is a 56 year old female with end-stage renal disease on peritoneal dialysis who general surgery has been asked to see emergently for possible ischemic bowel.  She has a significant past medical history of end-stage renal disease, MR, seizure disorder, schizoaffective disorder, etc.  Because of her altered mental status she cannot answer questions.  I obtained history from her sister who was in the room.  Apparently she has been having several days of abdominal pain, nausea, and vomiting.  She has had similar episodes for the last several months.  Work-up had been unremarkable until today.  She presented with hypotension and tachycardia.  She is found to have an elevated white blood count and lactic acid level and underwent a CT scan showing pneumatosis of the stomach, duodenum, small bowel.  She was also found to have gas in the small bowel vasculature and diffuse portal venous gas throughout the liver.  This is worrisome for extensive bowel ischemia.  Again, she is on peritoneal dialysis but does make a small amount of urine.  Past Medical History:  Diagnosis Date   Abnormal gait    Anemia    Bipolar disorder (HCC)    Depression    ESRD on peritoneal dialysis ([place])    ESRD on peritoneal dialysis ([place])    GERD (gastroesophageal reflux disease)    Hyperlipemia    Hypertension    Hypothyroidism    Moderately mentally retarded    Schizophrenia ([place])    Seizures ([place])    No recent seizures - no meds    Past Surgical History:  Procedure Laterality Date   BREAST REDUCTION SURGERY     BREAST SURGERY     breast reduction   EXAMINATION UNDER ANESTHESIA N/A 12/08/2013   Procedure: EXAM UNDER ANESTHESIA;  Surgeon: Lavonia Drafts, MD;  Location: [facility];  Service: Gynecology;  Laterality: N/A;  Pelvic exam and pap smear, unable to tolerate during last office visit    FOOT SURGERY     right   IR FLUORO GUIDE CV LINE RIGHT  02/01/2021   IR REMOVAL TUN CV CATH W/O FL  03/15/2021   IR US GUIDE VASC ACCESS RIGHT  02/01/2021   LUMBAR LAMINECTOMY/DECOMPRESSION MICRODISCECTOMY N/A 01/25/2021   Procedure: Lumbar Five -Sacral One Laminectomy for Epidural Abscess;  Surgeon: Earnie Larsson, MD;  Location: [facility];  Service: Neurosurgery;  Laterality: N/A;   REDUCTION MAMMAPLASTY Bilateral     Family History  Problem Relation Age of Onset   Hypertension Other    Breast cancer Mother 30    Social History:  reports that she has never smoked. She has never used smokeless tobacco. She reports that she does not drink alcohol and does not use drugs.  Allergies:  Allergies  Allergen Reactions   Icodextrin Rash    Medications: I have reviewed the patient's current medications.  Results for orders placed or performed during the hospital encounter of 09/07/2022 (from the past 48 hour(s))  Comprehensive metabolic panel     Status: Abnormal   Collection Time: 09/06/2022  7:00 PM  Result Value Ref Range   Sodium 135 135 - 145 mmol/L   Potassium 3.0 (L) 3.5 - 5.1 mmol/L   Chloride 100 98 - 111 mmol/L   CO2 22 22 - 32 mmol/L   Glucose, Bld 96 70 - 99 mg/dL    Comment: Glucose reference range applies only to samples taken  after fasting for at least 8 hours.   BUN 43 (H) 6 - 20 mg/dL   Creatinine, Ser 4.73 (H) 0.44 - 1.00 mg/dL   Calcium 7.0 (L) 8.9 - 10.3 mg/dL   Total Protein 5.1 (L) 6.5 - 8.1 g/dL   Albumin <1.5 (L) 3.5 - 5.0 g/dL   AST 37 15 - 41 U/L   ALT 11 0 - 44 U/L   Alkaline Phosphatase 310 (H) 38 - 126 U/L   Total Bilirubin 0.5 0.3 - 1.2 mg/dL   GFR, Estimated 10 (L) >60 mL/min    Comment: (NOTE) Calculated using the CKD-EPI Creatinine Equation (2021)    Anion gap 13 5 - 15    Comment: Performed at [facility], [place] [address], [place], [place] [address]  CBC with Differential     Status: Abnormal   Collection Time: 09/07/2022  7:00 PM   Result Value Ref Range   WBC 17.9 (H) 4.0 - 10.5 K/uL   RBC 3.26 (L) 3.87 - 5.11 MIL/uL   Hemoglobin 10.3 (L) 12.0 - 15.0 g/dL   HCT 33.4 (L) 36.0 - 46.0 %   MCV 102.5 (H) 80.0 - 100.0 fL   MCH 31.6 26.0 - 34.0 pg   MCHC 30.8 30.0 - 36.0 g/dL   RDW 14.4 11.5 - 15.5 %   Platelets 384 150 - 400 K/uL   nRBC 0.1 0.0 - 0.2 %   Neutrophils Relative % 85 %   Neutro Abs 15.2 (H) 1.7 - 7.7 K/uL   Lymphocytes Relative 6 %   Lymphs Abs 1.1 0.7 - 4.0 K/uL   Monocytes Relative 6 %   Monocytes Absolute 1.2 (H) 0.1 - 1.0 K/uL   Eosinophils Relative 0 %   Eosinophils Absolute 0.0 0.0 - 0.5 K/uL   Basophils Relative 1 %   Basophils Absolute 0.1 0.0 - 0.1 K/uL   WBC Morphology INCREASED BANDS (>20% BANDS)     Comment: TOXIC GRANULATION VACUOLATED NEUTROPHILS    Immature Granulocytes 2 %   Abs Immature Granulocytes 0.29 (H) 0.00 - 0.07 K/uL   Burr Cells PRESENT     Comment: Performed at [facility], [place] [address] [place], [place] [address]  Protime-INR     Status: Abnormal   Collection Time: 09/18/2022  7:00 PM  Result Value Ref Range   Prothrombin Time 19.4 (H) 11.4 - 15.2 seconds   INR 1.7 (H) 0.8 - 1.2    Comment: (NOTE) INR goal varies based on device and disease states. Performed at [facility], [place] [address] [place], [place] [address]   APTT     Status: Abnormal   Collection Time: 09/19/2022  7:00 PM  Result Value Ref Range   aPTT 37 (H) 24 - 36 seconds    Comment:        IF BASELINE aPTT IS ELEVATED, SUGGEST PATIENT RISK ASSESSMENT BE USED TO DETERMINE APPROPRIATE ANTICOAGULANT THERAPY. Performed at [facility], [place] [address], [place], [place] [address]   Lipase, blood     Status: None   Collection Time: 08/25/2022  7:00 PM  Result Value Ref Range   Lipase 19 11 - 51 U/L    Comment: Performed at [facility], [place] [address], [place], [place] [address]  Magnesium     Status: Abnormal    Collection Time: 08/27/2022  7:00 PM  Result Value Ref Range   Magnesium 1.3 (L) 1.7 - 2.4 mg/dL    Comment:  Performed at [facility], [place] [address] [place], [place] [address]  Lactic acid, plasma     Status: Abnormal   Collection Time: 09/05/2022  7:10 PM  Result Value Ref Range   Lactic Acid, Venous 6.2 (HH) 0.5 - 1.9 mmol/L    Comment: CRITICAL RESULT CALLED TO, READ BACK BY AND VERIFIED WITH HURLEY A. RN '@2005'$  O N 09/09/2022 BY KERLANDIA C. Performed at [facility], [place] [address] [place], [place] [address]   Ginger Carne 8, ED     Status: Abnormal   Collection Time: 09/07/2022  8:14 PM  Result Value Ref Range   Sodium 135 135 - 145 mmol/L   Potassium 3.0 (L) 3.5 - 5.1 mmol/L   Chloride 98 98 - 111 mmol/L   BUN 34 (H) 6 - 20 mg/dL   Creatinine, Ser 4.60 (H) 0.44 - 1.00 mg/dL   Glucose, Bld 81 70 - 99 mg/dL    Comment: Glucose reference range applies only to samples taken after fasting for at least 8 hours.   Calcium, Ion 0.97 (L) 1.15 - 1.40 mmol/L   TCO2 22 22 - 32 mmol/L   Hemoglobin 10.5 (L) 12.0 - 15.0 g/dL   HCT 31.0 (L) 36.0 - 46.0 %  Resp Panel by RT-PCR (Flu A&B, Covid) Anterior Nasal Swab     Status: None   Collection Time: 09/16/2022  8:46 PM   Specimen: Anterior Nasal Swab  Result Value Ref Range   SARS Coronavirus 2 by RT PCR NEGATIVE NEGATIVE    Comment: (NOTE) SARS-CoV-2 target nucleic acids are NOT DETECTED.  The SARS-CoV-2 RNA is generally detectable in upper respiratory specimens during the acute phase of infection. The lowest concentration of SARS-CoV-2 viral copies this assay can detect is 138 copies/mL. A negative result does not preclude SARS-Cov-2 infection and should not be used as the sole basis for treatment or other patient management decisions. A negative result may occur with  improper specimen collection/handling, submission of specimen other than nasopharyngeal swab, presence of viral mutation(s) within  the areas targeted by this assay, and inadequate number of viral copies(<138 copies/mL). A negative result must be combined with clinical observations, patient history, and epidemiological information. The expected result is Negative.  Fact Sheet for Patients:  EntrepreneurPulse.com.au  Fact Sheet for Healthcare Providers:  IncredibleEmployment.be  This test is no t yet approved or cleared by the [place] FDA and  has been authorized for detection and/or diagnosis of SARS-CoV-2 by FDA under an Emergency Use Authorization (EUA). This EUA will remain  in effect (meaning this test can be used) for the duration of the COVID-19 declaration under Section 564(b)(1) of the Act, 21 U.S.C.section 360bbb-3(b)(1), unless the authorization is terminated  or revoked sooner.       Influenza A by PCR NEGATIVE NEGATIVE   Influenza B by PCR NEGATIVE NEGATIVE    Comment: (NOTE) The Xpert Xpress SARS-CoV-2/FLU/RSV plus assay is intended as an aid in the diagnosis of influenza from Nasopharyngeal swab specimens and should not be used as a sole basis for treatment. Nasal washings and aspirates are unacceptable for Xpert Xpress SARS-CoV-2/FLU/RSV testing.  Fact Sheet for Patients: EntrepreneurPulse.com.au  Fact Sheet for Healthcare Providers: IncredibleEmployment.be  This test is not yet approved or cleared by the [place] FDA and has been authorized for detection and/or diagnosis of SARS-CoV-2 by FDA under an Emergency Use Authorization (EUA). This EUA will remain in effect (meaning this test can be used) for the duration of  the COVID-19 declaration under Section 564(b)(1) of the Act, 21 U.S.C. section 360bbb-3(b)(1), unless the authorization is terminated or revoked.  Performed at [facility], [place] [address], [place], [place] [address]   Lactic acid, plasma     Status: Abnormal   Collection  Time: 08/30/2022  8:46 PM  Result Value Ref Range   Lactic Acid, Venous 6.2 (HH) 0.5 - 1.9 mmol/L    Comment: CRITICAL VALUE NOTED. VALUE IS CONSISTENT WITH PREVIOUSLY REPORTED/CALLED VALUE Performed at [place] [address] [place], [place] [address]     CT CHEST ABDOMEN PELVIS WO CONTRAST  Result Date: 09/02/2022 CLINICAL DATA:  Abdominal pain for 3 days. Hypotension. Seizure like activity. EXAM: CT CHEST, ABDOMEN AND PELVIS WITHOUT CONTRAST TECHNIQUE: Multidetector CT imaging of the chest, abdomen and pelvis was performed following the standard protocol without IV contrast. RADIATION DOSE REDUCTION: This exam was performed according to the departmental dose-optimization program which includes automated exposure control, adjustment of the mA and/or kV according to patient size and/or use of iterative reconstruction technique. COMPARISON:  06/28/2022 FINDINGS: CT CHEST FINDINGS Cardiovascular: There is mild cardiac enlargement. No pericardial effusion. Aortic atherosclerosis and coronary artery calcifications. Mediastinum/Nodes: 1.8 cm nodule within the right lobe thyroid gland nodule is identified, image 15/3. Trachea appears patent. The esophagus appears distended and filled with debris and or fluid, image 24/3. No enlarged axillary, supraclavicular, or mediastinal adenopathy. Lungs/Pleura: Ground-glass and airspace disease is identified throughout the right lung. This is most severe within the right lower lobe. Findings may reflect sequelae of aspiration and or pneumonia. The left lung appears relatively clear. No signs of pleural effusion or interstitial edema. Musculoskeletal: Bony stigmata of renal osteodystrophy noted. CT ABDOMEN PELVIS FINDINGS Hepatobiliary: There is diffuse portal venous gas identified throughout the liver. Stone within the gallbladder neck measures 1.3 cm. No significant biliary dilatation. Pancreas: Unremarkable. No pancreatic ductal dilatation or  surrounding inflammatory changes. Spleen: Normal appearance of the spleen. Adrenals/Urinary Tract: The adrenal glands are unremarkable. Bilateral in stage kidneys with multiple cysts. Multiple medullary calcifications noted. Multiple calcifications are identified bilaterally. The largest is in the inferior pole of the left kidney measuring 6 mm, image 72/3. bladder appears partially decompressed. Stomach/Bowel: There is gas noted within the dependent wall of the distal stomach, duodenum and proximal small bowel loops. The small bowel loops are mildly dilated measuring up to 3.6 cm. No significant small bowel wall thickening identified. Gradual decrease in caliber of the small bowel loops. Terminal ileum appears within normal limits. The colon appears diffusely decompressed without wall thickening or inflammation. A few scattered colonic diverticula noted without signs of diverticulitis. Vascular/Lymphatic: Aortic atherosclerosis. No aneurysm. No signs of abdominopelvic adenopathy. There is gas identified throughout the small bowel mesenteric vasculature. Signs of venous congestion within the small bowel mesentery is also noted. Reproductive: Uterus and bilateral adnexa are unremarkable. Other: There is a peritoneal dialysis catheter which enters from a right lower quadrant abdominal protrude. The catheter terminates within the right iliac fossa. A moderate volume of fluid is identified within the abdomen and pelvis which is nonspecific and may reflect dialysate. Small volume of free air is identified within abdomen, nonspecific in the setting of indwelling peritoneal dialysis catheter. No signs of focal fluid collection. Musculoskeletal: Signs of avascular necrosis identified within both femoral heads. Sequelae of prior discitis/osteomyelitis at the L5-S1 level is again noted with destructive endplate changes and fragmentation resulting in severe spinal and bilateral foraminal stenosis. The appearance is similar to  06/28/2022. IMPRESSION: 1. There is pneumatosis noted within the dependent wall of the distal stomach, duodenum and proximal small bowel loops. There is gas identified throughout the small bowel mesenteric vasculature. There is diffuse portal venous gas identified throughout the liver. Diffuse venous congestion is identified within the mesenteric fat. Imaging findings are concerning for bowel ischemia. 2. Ground-glass and airspace disease is identified throughout the right lung. This is most severe within the right lower lobe. Findings may reflect sequelae of aspiration and/or pneumonia. 3. Signs of prior discitis/osteomyelitis at the L5-S1 level with destructive endplate changes and fragmentation resulting in severe spinal and bilateral foraminal stenosis. The appearance is similar to 06/28/2022. 4. Signs of avascular necrosis within both femoral heads. 5. Gallstone. 6. 1.8 cm incidental right thyroid nodule. Recommend non-emergent thyroid ultrasound. Reference: J Am Coll Radiol. 2015 Feb;12(2): 143-50 7.  Aortic Atherosclerosis (ICD10-I70.0). Critical Value/emergent results were called by telephone at the time of interpretation on 09/02/2022 at 8:41 pm to provider [facility] , who verbally acknowledged these results. Electronically Signed   By: Kerby Moors M.D.   On: 09/06/2022 20:43   CT Head Wo Contrast  Result Date: 08/26/2022 CLINICAL DATA:  Mental status change, unknown cause EXAM: CT HEAD WITHOUT CONTRAST TECHNIQUE: Contiguous axial images were obtained from the base of the skull through the vertex without intravenous contrast. RADIATION DOSE REDUCTION: This exam was performed according to the departmental dose-optimization program which includes automated exposure control, adjustment of the mA and/or kV according to patient size and/or use of iterative reconstruction technique. COMPARISON:  None Available. FINDINGS: Brain: No evidence of acute infarction, hemorrhage, hydrocephalus, extra-axial  collection or mass lesion/mass effect. Partially empty sella. Patchy white matter hypodensities, nonspecific but compatible with chronic microvascular ischemic disease. Vascular: No acute fracture. Skull: No acute fracture. Sinuses/Orbits: No acute finding. Other: No mastoid effusions. IMPRESSION: No evidence of acute intracranial abnormality. Electronically Signed   By: Margaretha Sheffield M.D.   On: 09/05/2022 20:42   DG Chest Port 1 View  Result Date: 08/30/2022 CLINICAL DATA:  Questionable sepsis. Per technologist note, best possible images obtained as patient is combative. EXAM: PORTABLE CHEST 1 VIEW COMPARISON:  Chest x-ray Mar 18, 2022 FINDINGS: Markedly limited assessment due to patient positioning. Normal heart size. Limited assessment of the mediastinal contours due to patient positioning. The visualized portions of the lungs are clear, however there is limited assessment of the right lower lobe. No large pleural effusion or pneumothorax. No acute osseous abnormality. IMPRESSION: Markedly limited assessment due to patient positioning. However within this limitation, no acute cardiopulmonary process is identified. Electronically Signed   By: Beryle Flock M.D.   On: 09/11/2022 19:34    Review of Systems  Unable to perform ROS: Mental status change   Blood pressure 100/63, pulse (!) 111, temperature (!) 93.4 F (34.1 C), temperature source Rectal, resp. rate (!) 27, SpO2 98 %. Physical Exam Constitutional:      General: She is in acute distress.     Appearance: She is ill-appearing and toxic-appearing.  HENT:     Head: Normocephalic and atraumatic.     Right Ear: External ear normal.     Left Ear: External ear normal.     Mouth/Throat:     Mouth: Mucous membranes are dry.  Eyes:     General: No scleral icterus.    Pupils: Pupils are equal, round, and reactive to light.  Cardiovascular:     Rate and Rhythm: Regular rhythm. Tachycardia present.  Pulmonary:  Effort: Pulmonary  effort is normal. No respiratory distress.     Breath sounds: Normal breath sounds.  Abdominal:     Comments: Diffusely tender, firm abdomen with guarding throughout consistent with frank peritonitis  Musculoskeletal:     Cervical back: Normal range of motion and neck supple.  Skin:    General: Skin is warm.     Coloration: Skin is not jaundiced.  Neurological:     Mental Status: She is disoriented.     Cranial Nerves: No cranial nerve deficit.  Psychiatric:     Comments: Confused    ACS RISK CALCULATOR USE:  Risk Calculator was used for discussion of surgery: Yes     Assessment/Plan:   Coralie Keens 09/07/2022 9:49 PM  Assessment/Plan: Acute abdomen with pneumatosis, portal venous gas, and ischemic bowel  I discussed the diagnosis with the patient's sister.  She is in shock and emergent exploratory laparotomy is recommended.  I discussed the reason for this with her sister.  I discussed the risk of surgery which includes but is not limited to bleeding, infection, injury to surrounding structures, the need for resection of bowel, the potential for ostomy formation, the potential do have to leave the abdomen open.,  The potential for return trips to the operating room, the potential for finding so much ischemic bowel that this may not be compatible with life, the need for prolonged ventilation, significant cardiopulmonary issues, ongoing sepsis, DVT, and death.  The ACS calculator puts her risk at 03.2% of major complications.  CCM is also aware of the patient.  Surgery is planned emergently.  We discussed the likelihood that she would not survive without surgery.  We discussed comfort care without surgery should she choose this.  The patient's sister would still like [place] to proceed to the operating room.  Complex medical decision making  Coralie Keens MD 08/27/2022, 9:40 PM

## 2022-08-28 NOTE — ED Provider Notes (Signed)
[facility]-EMERGENCY DEPT Provider Note   CSN: [id number] Arrival date & time: September 17, 2022  1836     History  No chief complaint on file.   Jane [name] is a 56 y.o. female.  For 5 caveat secondary to altered mental status.  Patient has a history of MR, seizure disorder, schizoaffective, end-stage renal disease on peritoneal dialysis.  Per EMS patient had about 30 seconds of seizure-like activity.  Has not had a seizure in over a year.  They also state the patient had complained of abdominal pain for 3 days.  Was hypotensive and was given IV fluids.  Patient unable to give any history at this time.  The history is provided by the EMS personnel and the patient.  Altered Mental Status Presenting symptoms: combativeness and confusion   Most recent episode:  Today Associated symptoms: abdominal pain and seizures        Home Medications Prior to Admission medications   Medication Sig Start Date End Date Taking? Authorizing Provider  albuterol (PROVENTIL HFA;VENTOLIN HFA) 108 (90 Base) MCG/ACT inhaler Inhale 2 puffs into the lungs every 6 (six) hours as needed for wheezing or shortness of breath.    [provider]  B Complex-C-Folic Acid (DIALYVITE 800) 0.8 MG TABS Take 0.8 mg by mouth daily.     [provider]  benztropine (COGENTIN) 1 MG tablet Take 1 mg by mouth at bedtime.    [provider]  divalproex (DEPAKOTE) 500 MG DR tablet Take 500 mg by mouth 2 (two) times daily. 08/02/21   [provider]  Docusate Sodium (DSS) 100 MG CAPS Take 100 mg by mouth daily as needed (constipation).    [provider]  FLUoxetine (PROZAC) 20 MG capsule Take 20 mg by mouth daily. 07/15/20   [provider]  hydrocortisone (ANUSOL-HC) 2.5 % rectal cream Place 1 Application rectally 2 (two) times daily. 06/28/22   Sloan Leiter, DO  iron polysaccharides (NIFEREX) 150 MG capsule Take 150 mg by mouth every other day.     [provider]  levETIRAcetam (KEPPRA) 500 MG tablet Take 1 tablet (500 mg total) by mouth 2 (two) times daily. 09/17/15   Marinda Elk, MD  levothyroxine (SYNTHROID, LEVOTHROID) 50 MCG tablet Take 50 mcg by mouth daily with breakfast. 10/05/17   [provider]  losartan (COZAAR) 50 MG tablet Take 50 mg by mouth daily as needed. >160 03/15/22   [provider]  medroxyPROGESTERone (DEPO-PROVERA) 150 MG/ML injection Inject 150 mg into the muscle every 3 (three) months.    [provider]  midodrine (PROAMATINE) 10 MG tablet Place 1 tablet (10 mg total) into feeding tube 3 (three) times daily with meals. 09/11/21   Danford, Earl Lites, MD  ondansetron (ZOFRAN) 4 MG tablet Take 1 tablet (4 mg total) by mouth every 4 (four) hours as needed for nausea or vomiting. 06/28/22   Sloan Leiter, DO  pantoprazole (PROTONIX) 20 MG tablet Take 1 tablet (20 mg total) by mouth daily for 14 days. 06/28/22 07/12/22  Tanda Rockers A, DO  potassium chloride SA (KLOR-CON M) 20 MEQ tablet Take 1 tablet (20 mEq total) by mouth daily with breakfast. Patient taking differently: Take 10 mEq by mouth daily with breakfast. 12/08/21   Dione Booze, MD  risperiDONE (RISPERDAL) 3 MG tablet Take 3 mg by mouth at bedtime.    [provider]  sucralfate (CARAFATE) 1 g tablet Take 1 tablet (1 g total) by mouth with  breakfast, with lunch, and with evening meal for 7 days. 06/28/22 07/05/22  Sloan Leiter, DO      Allergies    Icodextrin    Review of Systems   Review of Systems  Unable to perform ROS: Mental status change  Gastrointestinal:  Positive for abdominal pain.  Neurological:  Positive for seizures.  Psychiatric/Behavioral:  Positive for confusion.     Physical Exam Updated Vital Signs BP (!) 81/58   Pulse (!) 111   Temp 97.7 F (36.5 C)   Resp 18   Ht 5\' 5"  (1.651 m)   Wt 60.6 kg   SpO2 97%   BMI 22.23 kg/m  Physical Exam Vitals and nursing note reviewed.   Constitutional:      Appearance: She is well-developed. She is ill-appearing.  HENT:     Head: Normocephalic and atraumatic.  Eyes:     Conjunctiva/sclera: Conjunctivae normal.  Cardiovascular:     Rate and Rhythm: Normal rate and regular rhythm.     Heart sounds: No murmur heard. Pulmonary:     Effort: Pulmonary effort is normal. No respiratory distress.     Breath sounds: Normal breath sounds.  Abdominal:     General: There is distension.     Palpations: Abdomen is soft.     Tenderness: There is abdominal tenderness.     Comments: She has a catheter in her right mid abdomen  Musculoskeletal:        General: No deformity. Normal range of motion.     Cervical back: Neck supple.  Skin:    General: Skin is warm and dry.     Capillary Refill: Capillary refill takes less than 2 seconds.  Neurological:     Comments: Patient is awake.  She is somewhat verbal with difficult to understand speech.  She seems to be moving all extremities but not following commands.     ED Results / Procedures / Treatments   Labs (all labs ordered are listed, but only abnormal results are displayed) Labs Reviewed  LACTIC ACID, PLASMA - Abnormal; Notable for the following components:      Result Value   Lactic Acid, Venous 6.2 (*)    All other components within normal limits  LACTIC ACID, PLASMA - Abnormal; Notable for the following components:   Lactic Acid, Venous 6.2 (*)    All other components within normal limits  COMPREHENSIVE METABOLIC PANEL - Abnormal; Notable for the following components:   Potassium 3.0 (*)    BUN 43 (*)    Creatinine, Ser 4.73 (*)    Calcium 7.0 (*)    Total Protein 5.1 (*)    Albumin <1.5 (*)    Alkaline Phosphatase 310 (*)    GFR, Estimated 10 (*)    All other components within normal limits  CBC WITH DIFFERENTIAL/PLATELET - Abnormal; Notable for the following components:   WBC 17.9 (*)    RBC 3.26 (*)    Hemoglobin 10.3 (*)    HCT 33.4 (*)    MCV 102.5 (*)     Neutro Abs 15.2 (*)    Monocytes Absolute 1.2 (*)    Abs Immature Granulocytes 0.29 (*)    All other components within normal limits  PROTIME-INR - Abnormal; Notable for the following components:   Prothrombin Time 19.4 (*)    INR 1.7 (*)    All other components within normal limits  APTT - Abnormal; Notable for the following components:   aPTT 37 (*)    All  other components within normal limits  URINALYSIS, ROUTINE W REFLEX MICROSCOPIC - Abnormal; Notable for the following components:   APPearance HAZY (*)    Hgb urine dipstick SMALL (*)    Leukocytes,Ua MODERATE (*)    Bacteria, UA RARE (*)    All other components within normal limits  MAGNESIUM - Abnormal; Notable for the following components:   Magnesium 1.3 (*)    All other components within normal limits  CBC - Abnormal; Notable for the following components:   RBC 3.22 (*)    Hemoglobin 10.1 (*)    HCT 31.5 (*)    All other components within normal limits  COMPREHENSIVE METABOLIC PANEL - Abnormal; Notable for the following components:   Sodium 130 (*)    CO2 16 (*)    Glucose, Bld 147 (*)    BUN 39 (*)    Creatinine, Ser 3.91 (*)    Calcium 7.6 (*)    Total Protein 4.3 (*)    Albumin <1.5 (*)    Alkaline Phosphatase 231 (*)    GFR, Estimated 13 (*)    All other components within normal limits  PHOSPHORUS - Abnormal; Notable for the following components:   Phosphorus 5.2 (*)    All other components within normal limits  GLUCOSE, CAPILLARY - Abnormal; Notable for the following components:   Glucose-Capillary 111 (*)    All other components within normal limits  GLUCOSE, CAPILLARY - Abnormal; Notable for the following components:   Glucose-Capillary 119 (*)    All other components within normal limits  I-STAT CHEM 8, ED - Abnormal; Notable for the following components:   Potassium 3.0 (*)    BUN 34 (*)    Creatinine, Ser 4.60 (*)    Calcium, Ion 0.97 (*)    Hemoglobin 10.5 (*)    HCT 31.0 (*)    All other  components within normal limits  POCT I-STAT 7, (LYTES, BLD GAS, ICA,H+H) - Abnormal; Notable for the following components:   pO2, Arterial 116 (*)    Bicarbonate 19.7 (*)    TCO2 21 (*)    Acid-base deficit 6.0 (*)    Sodium 134 (*)    Potassium 3.4 (*)    Calcium, Ion 1.07 (*)    HCT 32.0 (*)    Hemoglobin 10.9 (*)    All other components within normal limits  POCT I-STAT 7, (LYTES, BLD GAS, ICA,H+H) - Abnormal; Notable for the following components:   pCO2 arterial 30.6 (*)    pO2, Arterial 150 (*)    Bicarbonate 17.7 (*)    TCO2 19 (*)    Acid-base deficit 7.0 (*)    Sodium 132 (*)    Potassium 3.2 (*)    Calcium, Ion 1.05 (*)    HCT 27.0 (*)    Hemoglobin 9.2 (*)    All other components within normal limits  RESP PANEL BY RT-PCR (FLU A&B, COVID) ARPGX2  CULTURE, BLOOD (ROUTINE X 2)  CULTURE, BLOOD (ROUTINE X 2)  URINE CULTURE  LIPASE, BLOOD  HIV ANTIBODY (ROUTINE TESTING W REFLEX)  STREP PNEUMONIAE URINARY ANTIGEN  MAGNESIUM  LEVETIRACETAM LEVEL  LEGIONELLA PNEUMOPHILA SEROGP 1 UR AG  TYPE AND SCREEN    EKG EKG Interpretation  Date/Time:  Monday August 28 2022 18:51:21 EST Ventricular Rate:  96 PR Interval:  126 QRS Duration: 95 QT Interval:  422 QTC Calculation: 534 R Axis:   33 Text Interpretation: Sinus rhythm Low voltage, extremity leads Nonspecific T abnormalities, lateral leads Prolonged QT  interval No significant change since prior 9/23 Confirmed by Meridee Score [phone]) on September 24, 2022 6:58:26 PM  Radiology DG Abd Portable 1V  Result Date: 08/29/2022 CLINICAL DATA:  NG tube placement EXAM: PORTABLE ABDOMEN - 1 VIEW COMPARISON:  None Available. FINDINGS: Enteric tube tip and side-port in the stomach. Partially visualized central venous catheter in the right atrium. Right basilar infiltrates. Prominent gas-filled loops of bowel in the upper abdomen. Small bowel pneumatosis in the left hemiabdomen as seen on CT Sep 24, 2022. IMPRESSION: Enteric tube tip and  side port in the stomach. Right basilar infiltrates, suspect pneumonia. Small bowel pneumatosis. Electronically Signed   By: Minerva Fester M.D.   On: 08/29/2022 03:44   DG CHEST PORT 1 VIEW  Result Date: 08/29/2022 CLINICAL DATA:  Central line placement EXAM: PORTABLE CHEST 1 VIEW COMPARISON:  Radiographs 09/24/2022 FINDINGS: Endotracheal tube in the intrathoracic trachea with tip 3.5 cm from the carina. Subdiaphragmatic enteric tube. Left IJ CVC tip in the right atrium. Stable cardiomediastinal silhouette. Aortic atherosclerotic calcification. Right-greater-than-left basilar opacities. No pleural effusion or pneumothorax. IMPRESSION: 1. Right right-greater-than-left basilar infiltrates suspicious for pneumonia. 2. Lines and tubes as described. Electronically Signed   By: Minerva Fester M.D.   On: 08/29/2022 03:41   CT CHEST ABDOMEN PELVIS WO CONTRAST  Result Date: Sep 24, 2022 CLINICAL DATA:  Abdominal pain for 3 days. Hypotension. Seizure like activity. EXAM: CT CHEST, ABDOMEN AND PELVIS WITHOUT CONTRAST TECHNIQUE: Multidetector CT imaging of the chest, abdomen and pelvis was performed following the standard protocol without IV contrast. RADIATION DOSE REDUCTION: This exam was performed according to the departmental dose-optimization program which includes automated exposure control, adjustment of the mA and/or kV according to patient size and/or use of iterative reconstruction technique. COMPARISON:  06/28/2022 FINDINGS: CT CHEST FINDINGS Cardiovascular: There is mild cardiac enlargement. No pericardial effusion. Aortic atherosclerosis and coronary artery calcifications. Mediastinum/Nodes: 1.8 cm nodule within the right lobe thyroid gland nodule is identified, image 15/3. Trachea appears patent. The esophagus appears distended and filled with debris and or fluid, image 24/3. No enlarged axillary, supraclavicular, or mediastinal adenopathy. Lungs/Pleura: Ground-glass and airspace disease is identified  throughout the right lung. This is most severe within the right lower lobe. Findings may reflect sequelae of aspiration and or pneumonia. The left lung appears relatively clear. No signs of pleural effusion or interstitial edema. Musculoskeletal: Bony stigmata of renal osteodystrophy noted. CT ABDOMEN PELVIS FINDINGS Hepatobiliary: There is diffuse portal venous gas identified throughout the liver. Stone within the gallbladder neck measures 1.3 cm. No significant biliary dilatation. Pancreas: Unremarkable. No pancreatic ductal dilatation or surrounding inflammatory changes. Spleen: Normal appearance of the spleen. Adrenals/Urinary Tract: The adrenal glands are unremarkable. Bilateral in stage kidneys with multiple cysts. Multiple medullary calcifications noted. Multiple calcifications are identified bilaterally. The largest is in the inferior pole of the left kidney measuring 6 mm, image 72/3. bladder appears partially decompressed. Stomach/Bowel: There is gas noted within the dependent wall of the distal stomach, duodenum and proximal small bowel loops. The small bowel loops are mildly dilated measuring up to 3.6 cm. No significant small bowel wall thickening identified. Gradual decrease in caliber of the small bowel loops. Terminal ileum appears within normal limits. The colon appears diffusely decompressed without wall thickening or inflammation. A few scattered colonic diverticula noted without signs of diverticulitis. Vascular/Lymphatic: Aortic atherosclerosis. No aneurysm. No signs of abdominopelvic adenopathy. There is gas identified throughout the small bowel mesenteric vasculature. Signs of venous congestion within the small bowel mesentery is also noted. Reproductive: Uterus  and bilateral adnexa are unremarkable. Other: There is a peritoneal dialysis catheter which enters from a right lower quadrant abdominal protrude. The catheter terminates within the right iliac fossa. A moderate volume of fluid is  identified within the abdomen and pelvis which is nonspecific and may reflect dialysate. Small volume of free air is identified within abdomen, nonspecific in the setting of indwelling peritoneal dialysis catheter. No signs of focal fluid collection. Musculoskeletal: Signs of avascular necrosis identified within both femoral heads. Sequelae of prior discitis/osteomyelitis at the L5-S1 level is again noted with destructive endplate changes and fragmentation resulting in severe spinal and bilateral foraminal stenosis. The appearance is similar to 06/28/2022. IMPRESSION: 1. There is pneumatosis noted within the dependent wall of the distal stomach, duodenum and proximal small bowel loops. There is gas identified throughout the small bowel mesenteric vasculature. There is diffuse portal venous gas identified throughout the liver. Diffuse venous congestion is identified within the mesenteric fat. Imaging findings are concerning for bowel ischemia. 2. Ground-glass and airspace disease is identified throughout the right lung. This is most severe within the right lower lobe. Findings may reflect sequelae of aspiration and/or pneumonia. 3. Signs of prior discitis/osteomyelitis at the L5-S1 level with destructive endplate changes and fragmentation resulting in severe spinal and bilateral foraminal stenosis. The appearance is similar to 06/28/2022. 4. Signs of avascular necrosis within both femoral heads. 5. Gallstone. 6. 1.8 cm incidental right thyroid nodule. Recommend non-emergent thyroid ultrasound. Reference: J Am Coll Radiol. 2015 Feb;12(2): 143-50 7.  Aortic Atherosclerosis (ICD10-I70.0). Critical Value/emergent results were called by telephone at the time of interpretation on September 09, 2022 at 8:41 pm to provider [facility] , who verbally acknowledged these results. Electronically Signed   By: Signa Kell M.D.   On: 09/09/22 20:43   CT Head Wo Contrast  Result Date: 09/09/22 CLINICAL DATA:  Mental status  change, unknown cause EXAM: CT HEAD WITHOUT CONTRAST TECHNIQUE: Contiguous axial images were obtained from the base of the skull through the vertex without intravenous contrast. RADIATION DOSE REDUCTION: This exam was performed according to the departmental dose-optimization program which includes automated exposure control, adjustment of the mA and/or kV according to patient size and/or use of iterative reconstruction technique. COMPARISON:  None Available. FINDINGS: Brain: No evidence of acute infarction, hemorrhage, hydrocephalus, extra-axial collection or mass lesion/mass effect. Partially empty sella. Patchy white matter hypodensities, nonspecific but compatible with chronic microvascular ischemic disease. Vascular: No acute fracture. Skull: No acute fracture. Sinuses/Orbits: No acute finding. Other: No mastoid effusions. IMPRESSION: No evidence of acute intracranial abnormality. Electronically Signed   By: Feliberto Harts M.D.   On: Sep 09, 2022 20:42   DG Chest Port 1 View  Result Date: 2022/09/09 CLINICAL DATA:  Questionable sepsis. Per technologist note, best possible images obtained as patient is combative. EXAM: PORTABLE CHEST 1 VIEW COMPARISON:  Chest x-ray Mar 18, 2022 FINDINGS: Markedly limited assessment due to patient positioning. Normal heart size. Limited assessment of the mediastinal contours due to patient positioning. The visualized portions of the lungs are clear, however there is limited assessment of the right lower lobe. No large pleural effusion or pneumothorax. No acute osseous abnormality. IMPRESSION: Markedly limited assessment due to patient positioning. However within this limitation, no acute cardiopulmonary process is identified. Electronically Signed   By: Jacob Moores M.D.   On: 2022-09-09 19:34    Procedures .Critical Care  Performed by: Terrilee Files, MD Authorized by: Terrilee Files, MD   Critical care provider statement:    Critical care  time (minutes):   80   Critical care time was exclusive of:  Separately billable procedures and treating other patients   Critical care was necessary to treat or prevent imminent or life-threatening deterioration of the following conditions:  Shock, sepsis and metabolic crisis   Critical care was time spent personally by me on the following activities:  Development of treatment plan with patient or surrogate, discussions with consultants, evaluation of patient's response to treatment, examination of patient, obtaining history from patient or surrogate, ordering and performing treatments and interventions, ordering and review of laboratory studies, ordering and review of radiographic studies, pulse oximetry, re-evaluation of patient's condition and review of old charts   I assumed direction of critical care for this patient from another provider in my specialty: no       Medications Ordered in ED Medications  lactated ringers infusion ( Intravenous Infusion Verify 08/29/22 0600)  0.9 %  sodium chloride infusion (150 mL/hr Intravenous New Bag/Given 08/29/22 0827)  albumin human 5 % solution (has no administration in time range)  vasopressin (PITRESSIN) 20 UNIT/ML injection (has no administration in time range)  fentaNYL (SUBLIMAZE) injection 50 mcg (50 mcg Intravenous Not Given 08/29/22 0645)  fentaNYL in NS (57mcg/ml) infusion-PREMIX (50 mcg/hr Intravenous Infusion Verify 08/29/22 0600)  fentaNYL (SUBLIMAZE) bolus via infusion 50-100 mcg (50 mcg Intravenous Bolus from Bag 08/29/22 0254)  piperacillin-tazobactam (ZOSYN) IVPB 2.25 g (0 g Intravenous Stopped 08/29/22 0550)  propofol (DIPRIVAN) 1000 MG/100ML infusion (25 mcg/kg/min  60.6 kg Intravenous New Bag/Given 08/29/22 0639)  Chlorhexidine Gluconate Cloth 2 % PADS 6 each (has no administration in time range)  norepinephrine (LEVOPHED) 16 mg in (0.064 mg/mL) premix infusion (80 mcg/min Intravenous Rate/Dose Change 08/29/22 0912)  vasopressin  (PITRESSIN) 20 Units in sodium chloride 0.9 % 100 mL infusion-*FOR SHOCK* (has no administration in time range)  lactated ringers bolus 1,000 mL (0 mLs Intravenous Stopped 08/29/22 0621)    And  lactated ringers bolus 1,000 mL (0 mLs Intravenous Stopped 08/29/22 0649)  ceFEPIme (MAXIPIME) 2 g in sodium chloride 0.9 % 100 mL IVPB (0 g Intravenous Stopped 08/28/22 2053)  metroNIDAZOLE (FLAGYL) IVPB 500 mg (500 mg Intravenous New Bag/Given 08/28/22 2026)  vasopressin (PITRESSIN) 20 Units in sodium chloride 0.9 % 100 mL infusion-*FOR SHOCK* (0.03 Units/min Intravenous New Bag/Given 08/29/22 0048)  albumin human 5 % solution 12.5 g (12.5 g Intravenous New Bag/Given 08/29/22 0047)  potassium chloride 10 mEq in 100 mL IVPB (10 mEq Intravenous New Bag/Given 08/29/22 0625)  magnesium sulfate IVPB 2 g 50 mL (0 g Intravenous Stopped 08/29/22 0507)  vasopressin (PITRESSIN) 20 Units in sodium chloride 0.9 % 100 mL infusion-*FOR SHOCK* (0.03 Units/min Intravenous Infusion Verify 08/29/22 0600)  sodium bicarbonate injection 100 mEq (100 mEq Intravenous Given 08/29/22 6962)    ED Course/ Medical Decision Making/ A&P Clinical Course as of 08/29/22 0955  Mon Aug 28, 2022  1924 Chest x-ray is markedly rotated.  Awaiting radiology reading. [MB]  2000 Patient's rectal temperature is 34.2.  Code sepsis was activated and antibiotics ordered.  She is getting IV fluids.  Bear hugger in place. [MB]  2038 Received a call from radiology that the patient has small bowel pneumatosis, portal vein gas. does have some free fluid and small amount of air.  Unclear if this is just due to her peritoneal dialysis. [MB]  2044 Discussed with Dr. Jayme Cloud critical care.  She asked me to put a bed in the [facility] campus.  She is ordering an  ABG and agrees with other management that is ongoing. [MB]  2050 Discussed with Dr. Magnus Ivan general surgery.  He said she needs to go to [facility] immediately.  He will talk to the surgeon at [facility].  She either needs  to immediately go to the ICU or go to the ED where she can be evaluated for possible operative intervention. [MB]  2117 Discussed with Dr. Allena Katz nephrology who will be available for the patient at [facility] if needed. [MB]  2142 CT CHEST ABDOMEN PELVIS WO CONTRAST Dr. Magnus Ivan tells me he is going to take the patient to the operating room here.  He is talking to the patient's sister regarding consent.  I have updated the ICU team. [MB]    Clinical Course User Index [MB] Terrilee Files, MD                           Medical Decision Making Amount and/or Complexity of Data Reviewed Labs: ordered. Radiology: ordered. Decision-making details documented in ED Course.  Risk Prescription drug management. Decision regarding hospitalization.   This patient complains of abdominal pain altered mental status seizure; this involves an extensive number of treatment Options and is a complaint that carries with it a high risk of complications and morbidity. The differential includes shock, sepsis, perforation, pneumonia, seizure, stroke, bleed  I ordered, reviewed and interpreted labs, which included CBC with elevated white count stable low hemoglobin chemistries consistent with her CKD lactate critically elevated, COVID and flu negative, blood culture sent I ordered medication IV fluids IV antibiotics IV pressors and reviewed PMP when indicated. I ordered imaging studies which included chest x-ray, CT head, CT chest abdomen and pelvis and I independently    visualized and interpreted imaging which showed pneumatosis of bowel and portal vein air concerning for mesenteric ischemia Additional history obtained from EMS and patient's sister Previous records obtained and reviewed in epic no recent admissions I consulted Dr. Jayme Cloud critical care and Dr. Magnus Ivan general surgery and discussed lab and imaging findings and discussed disposition.  Cardiac monitoring reviewed, sinus tachycardia Social  determinants considered, patient unable to advocate for self Critical Interventions: Initiation of antibiotics fluids and pressors for septic shock  After the interventions stated above, I reevaluated the patient and found patient to be critically ill Admission and further testing considered, she is being taken to the operating room for exploration.  She will ultimately need ICU level care.  Updated patient's sister of the critical nature of her illness.  She is at bedside with patient.         Final Clinical Impression(s) / ED Diagnoses Final diagnoses:  Sepsis [facility])    Rx / DC Orders ED Discharge Orders     None         Terrilee Files, MD 08/29/22 1001

## 2022-08-28 NOTE — Progress Notes (Signed)
A consult was received from an ED physician for cefepime per pharmacy dosing.  The patient's profile has been reviewed for ht/wt/allergies/indication/available labs.    I agree with the one time order already placed for cefepime 2g IV by provider.    Further antibiotics/pharmacy consults should be ordered by admitting physician if indicated.                       Thank you, Suzzanne Cloud, PharmD, BCPS 09/11/2022  7:10 PM

## 2022-08-28 NOTE — Progress Notes (Addendum)
PHARMACY CONSULT NOTE   Pharmacy Consult:  Replace electrolytes per Elink protocol.    Allergies  Allergen Reactions   Icodextrin Rash     Labs: Potassium 3.0    Plan:  KCl 10 mEq IV q1h x 6   Suzzanne Cloud, PharmD, BCPS 09/01/2022,9:18 PM

## 2022-08-28 NOTE — Anesthesia Procedure Notes (Signed)
Procedure Name: Intubation Date/Time: 08/29/2022 10:59 PM  Performed by: Cynda Familia, CRNAPre-anesthesia Checklist: Patient identified, Emergency Drugs available, Suction available and Patient being monitored Patient Re-evaluated:Patient Re-evaluated prior to induction Oxygen Delivery Method: Circle System Utilized Preoxygenation: Pre-oxygenation with 100% oxygen Induction Type: IV induction, Cricoid Pressure applied and Rapid sequence Ventilation: Mask ventilation without difficulty Laryngoscope Size: Miller and 2 Grade View: Grade I Tube type: Oral Tube size: 7.0 mm Number of attempts: 1 Airway Equipment and Method: Stylet and Oral airway Placement Confirmation: ETT inserted through vocal cords under direct vision, positive ETCO2 and breath sounds checked- equal and bilateral Secured at: 21 cm Tube secured with: Tape Dental Injury: Teeth and Oropharynx as per pre-operative assessment  Comments: IV induction Foster-- intubation AM CRNA atraumatic-- teeth and mouth as preop- bilat BS [employer]

## 2022-08-28 NOTE — Anesthesia Preprocedure Evaluation (Addendum)
Anesthesia Evaluation  Patient identified by MRN, date of birth, ID band  Reviewed: Unable to perform ROS - Chart review onlyPreop documentation limited or incomplete due to emergent nature of procedure.  Airway Mallampati: II  TM Distance: >3 FB Neck ROM: Full    Dental no notable dental hx.    Pulmonary sleep apnea and Continuous Positive Airway Pressure Ventilation , pneumonia, resolved   Pulmonary exam normal breath sounds clear to auscultation       Cardiovascular hypertension, Pt. on medications Normal cardiovascular exam Rhythm:Regular Rate:Tachycardia     Neuro/Psych Seizures -, Well Controlled,  PSYCHIATRIC DISORDERS  Depression Bipolar Disorder Schizophrenia  Schizoaffective disorder Mental retardation   GI/Hepatic Neg liver ROS,GERD  ,,Acute abdomen- suspected dead bowel pneumatosis of stomach, duodenum and small bowel   Endo/Other  Hypothyroidism  Obesity  Renal/GU ESRF and DialysisRenal diseaseOn Peritoneal dialysis  negative genitourinary   Musculoskeletal  (+) Arthritis ,    Abdominal  (+) + obese Abdomen: rigid.   Peds  Hematology  (+) Blood dyscrasia, anemia   Anesthesia Other Findings   Reproductive/Obstetrics                             Anesthesia Physical Anesthesia Plan  ASA: 4 and emergent  Anesthesia Plan: General   Post-op Pain Management: Ofirmev IV (intra-op)*   Induction: Intravenous, Rapid sequence and Cricoid pressure planned  PONV Risk Score and Plan: 4 or greater and Treatment may vary due to age or medical condition and Ondansetron  Airway Management Planned: Oral ETT  Additional Equipment: Arterial line  Intra-op Plan:   Post-operative Plan: Post-operative intubation/ventilation  Informed Consent: I have reviewed the patients History and Physical, chart, labs and discussed the procedure including the risks, benefits and alternatives for the  proposed anesthesia with the patient or authorized representative who has indicated his/her understanding and acceptance.     Dental advisory given  Plan Discussed with: CRNA and Anesthesiologist  Anesthesia Plan Comments:         Anesthesia Quick Evaluation

## 2022-08-28 NOTE — ED Triage Notes (Signed)
Pt bib [facility] from home s/p what family reports was 30 seconds of seizure activity. Pt immediately returned to neurological baseline after seizure activity. Pt c/o abdominal pain x 3 days. Pt hypotensive in the field. 250 ml's NS given.

## 2022-08-28 NOTE — Transfer of Care (Signed)
Immediate Anesthesia Transfer of Care Note  Patient: Jane [name]  Procedure(s) Performed: EXPLORATORY LAPAROTOMY MINOR REMOVAL OF PERITONEAL DIALYSIS CATHETER (Right: Abdomen)  Patient Location: PACU  Anesthesia Type:General  Level of Consciousness: sedated  Airway & Oxygen Therapy: Patient remains intubated per anesthesia plan and Patient placed on Ventilator (see vital sign flow sheet for setting)  Post-op Assessment: Report given to RN and Post -op Vital signs reviewed and stable  Post vital signs: Reviewed and stable  Last Vitals:  Vitals Value Taken Time  BP 113/85 08/27/2022 2355  Temp    Pulse 148 09/02/2022 2357  Resp 16 09/19/2022 2358  SpO2 88 % 09/13/2022 2357  Vitals shown include unvalidated device data.  Last Pain:  Vitals:   09/04/2022 2054  TempSrc: Rectal         Complications: No notable events documented.

## 2022-08-28 NOTE — Op Note (Signed)
EXPLORATORY LAPAROTOMY  Procedure Note  Jane [name] 09/16/2022   Pre-op Diagnosis: Ischemic Bowel with Pneumatosis     Post-op Diagnosis: same  Procedure(s): EXPLORATORY LAPAROTOMY   Surgeon(s): Coralie Keens, MD  Anesthesia: General  Staff:  Circulator: Gilda Crease, RN Relief Circulator: Rickard Rhymes, RN Relief Scrub: Mammie Lorenzo Scrub Person: Verda Cumins  Estimated Blood Loss: Minimal               Indications: This is a 56 year old female with multiple medical problems including end-stage renal disease on peritoneal dialysis.  She presented to the emergency department in shock.  She was found to have on CT scan extensive pneumatosis with portal venous gas and findings worrisome for ischemic bowel.  After discussion with the patient's sister, the decision was made to proceed to the operating room for emergent exploratory laparotomy  Findings: The patient was found to have infarcted small bowel from the ligament of Treitz to the terminal ileum.  The stomach was also dusky.  Given the findings of total infarction of her small bowel, the decision was made to terminate the procedure.  Procedure: The patient was brought to the operating room and identifies the correct patient.  She is placed upon the operating room table and general anesthesia was induced.  Her abdomen was then prepped and draped in the usual sterile fashion.  I created a midline incision with a scalpel.  I then took this down through the subcutaneous tissue to the fascia which was then opened the entire length of the incision.  Upon entering the abdomen the patient was found to have ascites which was fairly clear and consistent with dialysis fluid from her peritoneal dialysis.  Her small bowel, however, was infarcted.  I eviscerated the small bowel and then examined it from the ligament of Treitz to the terminal ileum.  The entire small bowel was infarcted from the ligament of Treitz to the  terminal ileum.  I also evaluated the stomach which was fairly dusky as well.  There was no visible viable small bowel other than part of the duodenum.  Given these findings, the decision was made to terminate the procedure.  I went ahead and removed her peritoneal dialysis catheter.  I then closed the patient's fascia with a running PDS suture.  The skin was then closed with skin staples.  The patient was left intubated and taken to the recovery room.  All counts were correct at the end of the procedure.          Coralie Keens   Date: 09/01/2022  Time: 11:43 PM

## 2022-08-29 ENCOUNTER — Encounter (HOSPITAL_COMMUNITY): Payer: Self-pay | Admitting: Surgery

## 2022-08-29 ENCOUNTER — Inpatient Hospital Stay (HOSPITAL_COMMUNITY): Payer: Medicare Other

## 2022-08-29 DIAGNOSIS — N186 End stage renal disease: Secondary | ICD-10-CM | POA: Diagnosis not present

## 2022-08-29 DIAGNOSIS — K55069 Acute infarction of intestine, part and extent unspecified: Secondary | ICD-10-CM | POA: Diagnosis not present

## 2022-08-29 DIAGNOSIS — A419 Sepsis, unspecified organism: Principal | ICD-10-CM

## 2022-08-29 DIAGNOSIS — K55029 Acute infarction of small intestine, extent unspecified: Secondary | ICD-10-CM | POA: Diagnosis present

## 2022-08-29 DIAGNOSIS — J9601 Acute respiratory failure with hypoxia: Secondary | ICD-10-CM

## 2022-08-29 DIAGNOSIS — R6521 Severe sepsis with septic shock: Secondary | ICD-10-CM | POA: Diagnosis not present

## 2022-08-29 DIAGNOSIS — Z992 Dependence on renal dialysis: Secondary | ICD-10-CM

## 2022-08-29 DIAGNOSIS — N2581 Secondary hyperparathyroidism of renal origin: Secondary | ICD-10-CM | POA: Diagnosis not present

## 2022-08-29 LAB — COMPREHENSIVE METABOLIC PANEL
ALT: 12 U/L (ref 0–44)
AST: 41 U/L (ref 15–41)
Albumin: <1.5 g/dL — ABNORMAL LOW (ref 3.5–5.0)
Alkaline Phosphatase: 231 U/L — ABNORMAL HIGH (ref 38–126)
Anion gap: 13 (ref 5–15)
BUN: 39 mg/dL — ABNORMAL HIGH (ref 6–20)
CO2: 16 mmol/L — ABNORMAL LOW (ref 22–32)
Calcium: 7.6 mg/dL — ABNORMAL LOW (ref 8.9–10.3)
Chloride: 101 mmol/L (ref 98–111)
Creatinine, Ser: 3.91 mg/dL — ABNORMAL HIGH (ref 0.44–1.00)
GFR, Estimated: 13 mL/min — ABNORMAL LOW (ref >60)
Glucose, Bld: 147 mg/dL — ABNORMAL HIGH (ref 70–99)
Potassium: 3.6 mmol/L (ref 3.5–5.1)
Sodium: 130 mmol/L — ABNORMAL LOW (ref 135–145)
Total Bilirubin: 0.5 mg/dL (ref 0.3–1.2)
Total Protein: 4.3 g/dL — ABNORMAL LOW (ref 6.5–8.1)

## 2022-08-29 LAB — CBC
HCT: 31.5 % — ABNORMAL LOW (ref 36.0–46.0)
Hemoglobin: 10.1 g/dL — ABNORMAL LOW (ref 12.0–15.0)
MCH: 31.4 pg (ref 26.0–34.0)
MCHC: 32.1 g/dL (ref 30.0–36.0)
MCV: 97.8 fL (ref 80.0–100.0)
Platelets: 344 10*3/uL (ref 150–400)
RBC: 3.22 MIL/uL — ABNORMAL LOW (ref 3.87–5.11)
RDW: 14.3 % (ref 11.5–15.5)
WBC: 6.5 10*3/uL (ref 4.0–10.5)
nRBC: 0 % (ref 0.0–0.2)

## 2022-08-29 LAB — POCT I-STAT 7, (LYTES, BLD GAS, ICA,H+H)
Acid-base deficit: 6 mmol/L — ABNORMAL HIGH (ref 0.0–2.0)
Acid-base deficit: 7 mmol/L — ABNORMAL HIGH (ref 0.0–2.0)
Bicarbonate: 19.7 mmol/L — ABNORMAL LOW (ref 20.0–28.0)
Bicarbonate: 17.7 mmol/L — ABNORMAL LOW (ref 20.0–28.0)
Calcium, Ion: 1.07 mmol/L — ABNORMAL LOW (ref 1.15–1.40)
Calcium, Ion: 1.05 mmol/L — ABNORMAL LOW (ref 1.15–1.40)
HCT: 32 % — ABNORMAL LOW (ref 36.0–46.0)
HCT: 27 % — ABNORMAL LOW (ref 36.0–46.0)
Hemoglobin: 10.9 g/dL — ABNORMAL LOW (ref 12.0–15.0)
Hemoglobin: 9.2 g/dL — ABNORMAL LOW (ref 12.0–15.0)
O2 Saturation: 99 %
O2 Saturation: 99 %
Patient temperature: 35.2
Patient temperature: 35.5
Potassium: 3.4 mmol/L — ABNORMAL LOW (ref 3.5–5.1)
Potassium: 3.2 mmol/L — ABNORMAL LOW (ref 3.5–5.1)
Sodium: 134 mmol/L — ABNORMAL LOW (ref 135–145)
Sodium: 132 mmol/L — ABNORMAL LOW (ref 135–145)
TCO2: 21 mmol/L — ABNORMAL LOW (ref 22–32)
TCO2: 19 mmol/L — ABNORMAL LOW (ref 22–32)
pCO2 arterial: 34.5 mmHg (ref 32–48)
pCO2 arterial: 30.6 mmHg — ABNORMAL LOW (ref 32–48)
pH, Arterial: 7.356 (ref 7.35–7.45)
pH, Arterial: 7.365 (ref 7.35–7.45)
pO2, Arterial: 116 mmHg — ABNORMAL HIGH (ref 83–108)
pO2, Arterial: 150 mmHg — ABNORMAL HIGH (ref 83–108)

## 2022-08-29 LAB — GLUCOSE, CAPILLARY
Glucose-Capillary: 111 mg/dL — ABNORMAL HIGH (ref 70–99)
Glucose-Capillary: 119 mg/dL — ABNORMAL HIGH (ref 70–99)
Glucose-Capillary: 13 mg/dL — CL (ref 70–99)
Glucose-Capillary: 19 mg/dL — CL (ref 70–99)
Glucose-Capillary: 36 mg/dL — CL (ref 70–99)
Glucose-Capillary: 39 mg/dL — CL (ref 70–99)
Glucose-Capillary: 41 mg/dL — CL (ref 70–99)
Glucose-Capillary: 64 mg/dL — ABNORMAL LOW (ref 70–99)
Glucose-Capillary: 73 mg/dL (ref 70–99)
Glucose-Capillary: 73 mg/dL (ref 70–99)
Glucose-Capillary: 91 mg/dL (ref 70–99)

## 2022-08-29 LAB — HIV ANTIBODY (ROUTINE TESTING W REFLEX): HIV Screen 4th Generation wRfx: NONREACTIVE

## 2022-08-29 LAB — MAGNESIUM: Magnesium: 2.3 mg/dL (ref 1.7–2.4)

## 2022-08-29 LAB — MRSA NEXT GEN BY PCR, NASAL: MRSA by PCR Next Gen: NOT DETECTED

## 2022-08-29 LAB — STREP PNEUMONIAE URINARY ANTIGEN: Strep Pneumo Urinary Antigen: NEGATIVE

## 2022-08-29 LAB — PHOSPHORUS: Phosphorus: 5.2 mg/dL — ABNORMAL HIGH (ref 2.5–4.6)

## 2022-08-29 MED ORDER — VASOPRESSIN 20 UNITS/100 ML INFUSION FOR SHOCK
0.0000 [IU]/min | Freq: Once | INTRAVENOUS | Status: AC
  Administered 2022-08-29: 0.03 [IU]/min via INTRAVENOUS
  Filled 2022-08-29: qty 100

## 2022-08-29 MED ORDER — DEXTROSE 50 % IV SOLN
50.0000 mL | Freq: Once | INTRAVENOUS | Status: AC
  Administered 2022-08-29: 50 mL via INTRAVENOUS

## 2022-08-29 MED ORDER — DEXTROSE IN LACTATED RINGERS 5 % IV SOLN: INTRAVENOUS | Status: DC

## 2022-08-29 MED ORDER — POTASSIUM CHLORIDE 10 MEQ/100ML IV SOLN
10.0000 meq | INTRAVENOUS | Status: AC
  Administered 2022-08-29 (×3): 10 meq via INTRAVENOUS
  Filled 2022-08-29 (×3): qty 100

## 2022-08-29 MED ORDER — DEXTROSE 50 % IV SOLN: 1.0000 | INTRAVENOUS | Status: AC

## 2022-08-29 MED ORDER — CHLORHEXIDINE GLUCONATE CLOTH 2 % EX PADS
6.0000 | MEDICATED_PAD | Freq: Every day | CUTANEOUS | Status: DC
  Administered 2022-08-29 – 2022-08-31 (×3): 6 via TOPICAL

## 2022-08-29 MED ORDER — DEXTROSE 50 % IV SOLN
INTRAVENOUS | Status: AC
  Administered 2022-08-29: 50 mL
  Filled 2022-08-29: qty 50

## 2022-08-29 MED ORDER — PIPERACILLIN-TAZOBACTAM IN DEX 2-0.25 GM/50ML IV SOLN
2.2500 g | Freq: Three times a day (TID) | INTRAVENOUS | Status: DC
  Administered 2022-08-29 – 2022-08-31 (×7): 2.25 g via INTRAVENOUS
  Filled 2022-08-29 (×8): qty 50

## 2022-08-29 MED ORDER — SODIUM CHLORIDE 0.9 % IV SOLN
INTRAVENOUS | Status: DC
  Administered 2022-08-29: 150 mL/h via INTRAVENOUS

## 2022-08-29 MED ORDER — NOREPINEPHRINE 16 MG/250ML-% IV SOLN
0.0000 ug/min | INTRAVENOUS | Status: DC
  Administered 2022-08-29 (×2): 70 ug/min via INTRAVENOUS
  Administered 2022-08-29: 75 ug/min via INTRAVENOUS
  Administered 2022-08-29: 50 ug/min via INTRAVENOUS
  Administered 2022-08-29: 75 ug/min via INTRAVENOUS
  Administered 2022-08-30: 67 ug/min via INTRAVENOUS
  Administered 2022-08-30: 69 ug/min via INTRAVENOUS
  Administered 2022-08-30: 75 ug/min via INTRAVENOUS
  Administered 2022-08-30: 70 ug/min via INTRAVENOUS
  Administered 2022-08-30: 75 ug/min via INTRAVENOUS
  Administered 2022-08-30 – 2022-08-31 (×4): 67 ug/min via INTRAVENOUS
  Filled 2022-08-29 (×14): qty 250

## 2022-08-29 MED ORDER — MAGNESIUM SULFATE 2 GM/50ML IV SOLN
2.0000 g | Freq: Once | INTRAVENOUS | Status: AC
  Administered 2022-08-29: 2 g via INTRAVENOUS
  Filled 2022-08-29: qty 50

## 2022-08-29 MED ORDER — ORAL CARE MOUTH RINSE: 15.0000 mL | OROMUCOSAL | Status: DC | PRN

## 2022-08-29 MED ORDER — POLYETHYLENE GLYCOL 3350 17 G PO PACK: 17.0000 g | PACK | Freq: Every day | ORAL | Status: DC

## 2022-08-29 MED ORDER — PROPOFOL 1000 MG/100ML IV EMUL: 0.0000 ug/kg/min | INTRAVENOUS | Status: DC

## 2022-08-29 MED ORDER — PROPOFOL 500 MG/50ML IV EMUL
INTRAVENOUS | Status: DC | PRN
  Administered 2022-08-28: 25 ug/kg/min via INTRAVENOUS

## 2022-08-29 MED ORDER — SODIUM BICARBONATE 8.4 % IV SOLN
100.0000 meq | Freq: Once | INTRAVENOUS | Status: AC
  Administered 2022-08-29: 100 meq via INTRAVENOUS
  Filled 2022-08-29: qty 100

## 2022-08-29 MED ORDER — PROPOFOL 1000 MG/100ML IV EMUL
INTRAVENOUS | Status: AC
  Administered 2022-08-29: 25 ug/kg/min via INTRAVENOUS
  Filled 2022-08-29: qty 100

## 2022-08-29 MED ORDER — VASOPRESSIN 20 UNIT/ML IV SOLN
INTRAVENOUS | Status: AC
  Filled 2022-08-29: qty 1

## 2022-08-29 MED ORDER — DEXTROSE 50 % IV SOLN
INTRAVENOUS | Status: AC
  Filled 2022-08-29: qty 50

## 2022-08-29 MED ORDER — VASOPRESSIN 20 UNITS/100 ML INFUSION FOR SHOCK
0.0000 [IU]/min | INTRAVENOUS | Status: DC
  Administered 2022-08-29 – 2022-08-31 (×7): 0.04 [IU]/min via INTRAVENOUS
  Filled 2022-08-29 (×7): qty 100

## 2022-08-29 MED ORDER — ALBUMIN HUMAN 5 % IV SOLN
INTRAVENOUS | Status: AC
  Filled 2022-08-29: qty 250

## 2022-08-29 MED ORDER — DEXTROSE 50 % IV SOLN
INTRAVENOUS | Status: AC
  Administered 2022-08-29: 50 mL via INTRAVENOUS
  Filled 2022-08-29: qty 50

## 2022-08-29 MED ORDER — ALBUMIN HUMAN 5 % IV SOLN
12.5000 g | Freq: Once | INTRAVENOUS | Status: AC
  Administered 2022-08-29: 12.5 g via INTRAVENOUS

## 2022-08-29 MED ORDER — DOCUSATE SODIUM 50 MG/5ML PO LIQD: 100.0000 mg | Freq: Two times a day (BID) | ORAL | Status: DC

## 2022-08-29 MED ORDER — ORAL CARE MOUTH RINSE
15.0000 mL | OROMUCOSAL | Status: DC
  Administered 2022-08-29 – 2022-08-31 (×24): 15 mL via OROMUCOSAL

## 2022-08-29 MED ORDER — FENTANYL CITRATE PF 50 MCG/ML IJ SOSY: 50.0000 ug | PREFILLED_SYRINGE | Freq: Once | INTRAMUSCULAR | Status: DC

## 2022-08-29 MED ORDER — FENTANYL BOLUS VIA INFUSION
50.0000 ug | INTRAVENOUS | Status: DC | PRN
  Administered 2022-08-29: 50 ug via INTRAVENOUS

## 2022-08-29 MED ORDER — DEXTROSE 50 % IV SOLN
12.5000 g | INTRAVENOUS | Status: AC
  Administered 2022-08-29: 12.5 g via INTRAVENOUS
  Filled 2022-08-29: qty 50

## 2022-08-29 MED ORDER — ACETAMINOPHEN 325 MG PO TABS: 650.0000 mg | ORAL_TABLET | Freq: Four times a day (QID) | ORAL | Status: DC | PRN

## 2022-08-29 MED ORDER — PROPOFOL 1000 MG/100ML IV EMUL
0.0000 ug/kg/min | INTRAVENOUS | Status: DC
  Administered 2022-08-29: 25 ug/kg/min via INTRAVENOUS
  Administered 2022-08-30: 20 ug/kg/min via INTRAVENOUS
  Filled 2022-08-29 (×2): qty 100

## 2022-08-29 MED ORDER — FENTANYL 2500MCG IN NS 250ML (10MCG/ML) PREMIX INFUSION
0.0000 ug/h | INTRAVENOUS | Status: DC
  Administered 2022-08-29: 50 ug/h via INTRAVENOUS
  Administered 2022-08-30: 75 ug/h via INTRAVENOUS
  Filled 2022-08-29 (×2): qty 250

## 2022-08-29 MED ORDER — SODIUM CHLORIDE 0.9 % IV SOLN: INTRAVENOUS | Status: DC | PRN

## 2022-08-29 MED ORDER — ACETAMINOPHEN 650 MG RE SUPP
650.0000 mg | Freq: Four times a day (QID) | RECTAL | Status: DC | PRN
  Administered 2022-08-29: 650 mg via RECTAL
  Filled 2022-08-29: qty 1

## 2022-08-29 NOTE — Progress Notes (Signed)
Nutrition Brief Note  Chart reviewed. Discussed pt with RN and during ICU rounds. Plan for transition to comfort care once family has opportunity to visit. No nutrition interventions planned at this time.  Please consult as needed.    Jane Bryant, MS, RD, LDN Inpatient Clinical Dietitian Please see AMiON for contact information.

## 2022-08-29 NOTE — Procedures (Addendum)
Central Venous Catheter Insertion Procedure Note  Jane [name]  [id number]  18-Feb-1966  Date:08/29/22  Time:3:27 AM   Provider Performing:Darriel Utter W Heber Rodney   Procedure: Insertion of Non-tunneled Central Venous [phone]) with US guidance ([id number])   Indication(s) Medication administration  Consent Risks of the procedure as well as the alternatives and risks of each were explained to the patient and/or caregiver.  Consent for the procedure was obtained and is signed in the bedside chart  Anesthesia Topical only with 1% lidocaine   Timeout Verified patient identification, verified procedure, site/side was marked, verified correct patient position, special equipment/implants available, medications/allergies/relevant history reviewed, required imaging and test results available.  Sterile Technique Maximal sterile technique including full sterile barrier drape, hand hygiene, sterile gown, sterile gloves, mask, hair covering, sterile ultrasound probe cover (if used).  Procedure Description Area of catheter insertion was cleaned with chlorhexidine and draped in sterile fashion.  With real-time ultrasound guidance a central venous catheter was placed into the left internal jugular vein. Nonpulsatile blood flow and easy flushing noted in all ports.  The catheter was sutured in place and sterile dressing applied.  Complications/Tolerance None; patient tolerated the procedure well. Chest X-ray is ordered to verify placement for internal jugular or subclavian cannulation.   Chest x-ray is not ordered for femoral cannulation.  EBL Minimal  Specimen(s) None    Georgann Housekeeper, AGACNP-BC [facility]  See Amion for personal pager PCCM on call pager [phone] until 7pm. Please call Elink 7p-7a. [phone]  08/29/2022 3:28 AM

## 2022-08-29 NOTE — Progress Notes (Addendum)
Patient ID: Jane [name], female   DOB: 10-30-1965, 56 y.o.   MRN: [id number] I was informed of this patient's admission to the hospital yesterday evening by the ED physicians at [facility].  Interim report/events significant for exploratory laparotomy for ischemic bowel/pneumatosis that showed infarction of the entire small bowel from the ligament of Treitz to terminal ileum which was deemed to be incompatible with life.  The patient has now been transitioned to comfort care awaiting visit from family before withdrawal.  Nephrology service will remain available for questions or concerns.  Elmarie Shiley MD [facility]. Office # [phone] Pager # [phone] 8:10 AM

## 2022-08-29 NOTE — Progress Notes (Signed)
eLink Physician-Brief Progress Note Patient Name: KOLLINS FENTER DOB: [birth year] MRN: [id number]   Date of Service  08/29/2022  HPI/Events of Note  Patient transferred from [facility] s/p OR where she was found on laparotomy to have complete small bowel infarction from diffuse mesenteric ischemia, surgical operation was aborted, patient is now DNR and awaiting the arrival of family members from out of town.  eICU Interventions  New patient Evaluation. Supportive care pending the arrival of family.        Kerry Kass Raushanah Osmundson 08/29/2022, 2:28 AM

## 2022-08-29 NOTE — Progress Notes (Signed)
  Transition of Care [facility]) Screening Note   Patient Details  Name: Jane [name] Date of Birth: 01-09-1966   Transition of Care [facility]) CM/SW Contact:    Tom-Johnson, Renea Ee, RN Phone Number: 08/29/2022, 3:29 PM  Patient is admitted with Sepsis. Found to have Small Bowel Infarct. Plan to transition to full comfort care after all family members has had the opportunity to visit. Currently intubated.  TOC will continue to follow and support during this transition time.

## 2022-08-29 NOTE — Progress Notes (Signed)
Patient ID: Jane [name], female   DOB: 1966-07-22, 55 y.o.   MRN: [id number] General surgery will be available as needed

## 2022-08-29 NOTE — Progress Notes (Addendum)
NAME:  Jane [name], MRN:  [id number], DOB:  Feb 03, 1966, LOS: 1 ADMISSION DATE:  09/19/2022, CONSULTATION DATE:  08/29/2022 REFERRING MD:  Ninfa Linden - CCS CHIEF COMPLAINT:  Ischemic bowel, hypotension  History of Present Illness:  56 year old woman who presented to [facility] ED 11/6 with abdominal pain, nausea/vomiting for several days. PMHx significant for HTN, HLD, ESRD on PD, hypothyroidism, seizures (not on AEDs), GERD, depression, bipolar disorder, schizophrenia, intellectual disability.  Upon ED arrival, patient was noted to be hypotensive and tachycardic.  She was given 2L of crystalloid for fluid resuscitation and was ultimately started on norepinephrine infusion.  CT A/P 11/6 demonstrated pneumatosis of the stomach, duodenum and entire small bowel with gas in the small bowel vasculature and diffuse portal venous gas throughout the liver.  With concern for bowel ischemia, patient was taken emergently to the operating room by Dr. Ninfa Linden. Unfortunately, in the OR she was found to have mesenteric ischemia with infarction of the entire small bowel.  Family was updated to the unsurvivable nature of her condition in the postoperative setting and she was transferred to the [facility] ICU for further care.  Patient was made DNR while awaiting additional family visitation prior to transition to comfort care.  Pertinent Medical History:   Past Medical History:  Diagnosis Date   Abnormal gait    Anemia    Bipolar disorder ([place])    Depression    ESRD on peritoneal dialysis ([place])    GERD (gastroesophageal reflux disease)    Hyperlipemia    Hypertension    Hypothyroidism    Moderately mentally retarded    Schizophrenia ([place])    Seizures ([place])    No recent seizures - no meds   Significant Hospital Events: Including procedures, antibiotic start and stop dates in addition to other pertinent events   11/6 Presented to [facility] with abdominal pain, CT concerning for bowel ischemia, emergently to the OR.   Found to have entire SB ischemia/infarct; deemed not survivable due to the degree of bowel involvement. BCx 1/4 +GPRs. Continues on Levo/vaso. Made DNR. 11/7 Ongoing hypotension despite max doses of Levophed, vasopressin. Bicarb pushes to maintain MAP. DNR, family at bedside with plan for transition to comfort care once all family members have had visitation.  Interim History / Subjective:  Admitted overnight from [facility] for profound bowel ischemia POD#1 from ex-lap, entire SB ischemia noted, nonsurvivable condition Patient made DNR Continuing Levophed, Vasopressin to maintain MAP Plan for transition to comfort care once family has had an opportunity to visit Family is aware this may not be possible if patient is unable to maintain BP and at that point would transition to comfort if necessary  Objective:  Blood pressure (!) 81/58, pulse (!) 111, temperature 97.7 F (36.5 C), resp. rate 18, height '5\' 5"'$  (1.651 m), weight 60.6 kg, SpO2 97 %.    Vent Mode: PRVC FiO2 (%):  [40 %-100 %] 40 % Set Rate:  [16 bmp-20 bmp] 18 bmp Vt Set:  [430 mL-450 mL] 450 mL PEEP:  [5 cmH20] 5 cmH20 Plateau Pressure:  [15 cmH20-16 cmH20] 15 cmH20   Intake/Output Summary (Last 24 hours) at 08/29/2022 0815 Last data filed at 08/29/2022 0600 Gross per 24 hour  Intake 4360.38 ml  Output 1025 ml  Net 3335.38 ml   Filed Weights   08/29/22 0500  Weight: 60.6 kg   Physical Examination: General: Chronically ill-appearing middle-aged woman in NAD. HEENT: [place]/AT, anicteric sclera, PERRL, moist mucous membranes. ETT/OGT in place. Neuro: Sedated. Withdraws to  pain in all 4 extremities. Not following commands. Moves all 4 extremities spontaneously. +Corneal, +Cough, and +Gag  CV: Tachycardic to 110s, regular rhythm, no m/g/r. PULM: Breathing even and unlabored on vent (PEEP 5, FiO2 40%). Lung fields CTAB. GI: Soft, not apparently TTP, mildly distended. Hypoactive bowel sounds. Extremities: Bilateral symmetric trace LE  edema noted, + dependent edema of BUE. Skin: Warm/dry, no rashes.  Resolved Hospital Problem List:     Assessment & Plan:   Small bowel infarct Per surgical team, infarction of the entire small bowel from the ligament of Treitz to the terminal ileum.  Unsurvivable injury. - ICU for ongoing care/comfort measures - Patient made DNR 11/6 - Awaiting family arrival prior to transition to full comfort measures - Continue Propofol/Fentanyl gtt for comfort  Distributive shock Septic shock secondary to above. - Goal MAP > 65 - Fluid resuscitated - Levophed, Vasopressin titrated to goal MAP (attempting to preserve patient's BP until family able to visit) - F/u Cx data - Continue Zosyn  Acute respiratory failure with hypoxia - Continue full vent support (4-8cc/kg IBW) - Wean FiO2 for O2 sat > 90% - VAP bundle - Pulmonary hygiene - PAD protocol for sedation: Propofol and Fentanyl for goal RASS -1 to -2; focus on comfort care  ESRD on PD Hypokalemia Hypomag - Trend BMP - Replete electrolytes as indicated - Monitor I&Os - PD catheter removed intraoperatively as patient will be transitioning to comfort care  Hx seizure: no recent sz. No home meds - ICU monitoring  GOC - Patient made DNR 11/6 in the setting of nonsurvivable bowel ischemia/injury - Plan for transition to comfort care once family has had opportunity to visit - Family is aware that patient may not survive until additional family has arrived - PMT not consulted in the setting of rapid decompensation - Anticipate in-hospital death  Best Practice (right click and "Reselect all SmartList Selections" daily)   Diet/type: NPO DVT prophylaxis: other GI prophylaxis: PPI Lines: Central line Foley:  Yes, and it is still needed Code Status:  DNR Last date of multidisciplinary goals of care discussion [ 11/7 ]  Critical care time:    The patient is critically ill with multiple organ system failure and requires high  complexity decision making for assessment and support, frequent evaluation and titration of therapies, advanced monitoring, review of radiographic studies and interpretation of complex data.   Critical Care Time devoted to patient care services, exclusive of separately billable procedures, described in this note is 37 minutes.  Lestine Mount, PA-C [facility] 08/29/22 8:15 AM  Please see [email or web address] for pager details.  From 7A-7P if no response, please call [phone] After hours, please call ELink [phone]

## 2022-08-29 NOTE — IPAL (Signed)
  Interdisciplinary Goals of Care Family Meeting   Date carried out: 08/29/2022  Location of the meeting: Bedside  Member's involved: Physician Assistant, Bedside Registered Nurse and Family Member or next of kin  Durable Power of Attorney or acting medical decision maker: Patient's father, Larae Caison and sister, Fidela Salisbury (caregiver)    Discussion: Discussed [place] for Ms. Lucetta.  I met with patient's family at bedside this morning to discuss patient's current clinical status and ultimately goals of care.  Patient's sister, [name], niece and 2 other family members were present.  Patient's father was able to join the conversation later in the afternoon on his arrival.  Throughout both family/goals of care discussions today, Myrth's clinical status was described in detail.  We reviewed patient's etiology of abdominal pain, nausea and vomiting that she had been experiencing and the sequence of events that led to her ischemic bowel, likely a combination of hypotension and possible bowel obstruction.  We reviewed the OR findings which included complete infarct of the entire small bowel to the ligament of Treitz and that this was not an operable condition.  Unfortunately, this is also likely not a survivable event and this was conveyed by PCCM night coverage, CCS, and PCCM day coverage.  Patient was appropriately made DNR 11/6 PM.  I explained to patient's family that she is on the maximal amount of support we can offer her at this time.  She is on max dose Levophed and vasopressin, remains on antibiotic coverage, has been receiving bicarbonate pushes and IV fluids to maintain her blood pressure and glucoses, and remains on the ventilator on full support.  I explained that even with all of these interventions, patient may pass in the next hours.  Family appears to understand this; patient's father was able to make it this afternoon and is at bedside.  They do not wish to transition to comfort care  at this time, rather saying she will pass if "it is God's will".  I reiterated that this is very likely to occur at some point this evening that we would recommend transition to comfort measures if at any point patient begins to show signs of discomfort or distress.  Patient's family was in agreement with this.  Code status: Full DNR, okay to continue mechanical ventilation and vasopressors; transition to comfort care if patient begins to show signs of discomfort or distress  Disposition: Anticipate in-hospital death, inpatient comfort care  Time spent for the meeting: 32 minutes  Lestine Mount, PA-C [facility] 08/29/22 6:23 PM  Please see [email or web address] for pager details.  From 7A-7P if no response, please call [phone] After hours, please call ELink [phone]

## 2022-08-29 NOTE — H&P (Addendum)
NAME:  Jane [name], MRN:  [id number], DOB:  05-10-1966, LOS: 1 ADMISSION DATE:  09/02/2022, CONSULTATION DATE:  11/7 REFERRING MD:  Dr. Rush Farmer, CHIEF COMPLAINT:  Ischemic bowel.    History of Present Illness:  Patient is encephalopathic and/or intubated. Therefore history has been obtained from chart review.  56 year old female with past medical history as below, which is significant for ESRD on peritoneal dialysis, electro disability, bipolar disorder, is affective disorder, hypertension, hyperlipidemia, hypothyroidism.  She presented with a long emergency department on 11/6 with complaints of abdominal pain, nausea, and vomiting for several days.  Upon arrival to the emergency department she was noted to be hypotensive and tachycardic.  She was given 2 L of crystalloid resuscitation and was ultimately started on norepinephrine infusion.  CT scan demonstrated pneumatosis of the stomach duodenum and small bowel.  Also showed gas in the small bowel vasculature and diffuse portal venous gas throughout the liver.  With concern for bowel ischemia she was taken emergently to the operating room by Dr. Ninfa Linden. Unfortunately in the operating room she was found to have infarction of the entire small bowel.  Family was updated to the unsurvivable nature of her condition in the postoperative setting and she was transferred to the ICU for further care.   Pertinent  Medical History   has a past medical history of Abnormal gait, Anemia, Bipolar disorder ([place]), Depression, ESRD on peritoneal dialysis ([place]), ESRD on peritoneal dialysis ([place]), GERD (gastroesophageal reflux disease), Hyperlipemia, Hypertension, Hypothyroidism, Moderately mentally retarded, Schizophrenia ([place]), and Seizures ([place]).   Significant Hospital Events: Including procedures, antibiotic start and stop dates in addition to other pertinent events   11/6 presented with abdominal pain, CT concerning for bowel ischemia, emergently to the OR.   Found to have infarct in the entire small bowel.  Interim History / Subjective:    Objective   Blood pressure (!) 156/98, pulse (!) 118, temperature (!) 95.7 F (35.4 C), resp. rate 20, height '5\' 5"'$  (1.651 m), SpO2 100 %.    Vent Mode: PRVC FiO2 (%):  [80 %-100 %] 80 % Set Rate:  [16 bmp] 16 bmp Vt Set:  [430 mL] 430 mL PEEP:  [5 cmH20] 5 cmH20 Plateau Pressure:  [15 cmH20-16 cmH20] 15 cmH20   Intake/Output Summary (Last 24 hours) at 08/29/2022 0151 Last data filed at 08/29/2022 2359 Gross per 24 hour  Intake 1350 ml  Output 425 ml  Net 925 ml   There were no vitals filed for this visit.  Examination: General: Critically ill-appearing middle-aged female HENT: Normocephalic, atraumatic.  Right pupil pinpoint left pupil 4 mm. Lungs: Clear bilateral breath sounds Cardiovascular: Regular rate and rhythm, no MRG Abdomen: Surgical dressing in place, clean and dry Extremities: No acute deformity Neuro: Sedated GU: Foley  Resolved Hospital Problem list     Assessment & Plan:   Small bowel infarct: Described by the surgical team as infarction of the entire small  bowel from the ligament of Treitz to the terminal ileum.  Unsurvivable injury. - Patient mated to ICU for ongoing care and comfort measures - Awaiting family arrival - Fentanyl for comfort  Distributive shock: Septic secondary to above - ICU hemodynamic monitoring - Vasopressor infusion shock dose - Norepinephrine infusion titrated to MAP of 65. Cap levo at 82mg with no escalation beyond.   Acute respiratory failure with hypoxia - full vent support - VAP bundle - ABG  ESRD on PD - Follow BMP - PD catheter removed, no role for further dialysis  at this time.   Hypokalemia Hypomag - Gentle K replacement - Gentle Mag replacement  Hx seizure: no recent sz. No home meds - ICU monitoring  Best Practice (right click and "Reselect all SmartList Selections" daily)   Diet/type: NPO DVT prophylaxis:  other GI prophylaxis: PPI Lines: Central line Foley:  Yes, and it is still needed Code Status:  DNR Last date of multidisciplinary goals of care discussion [ 11/7 ]  Labs   CBC: Recent Labs  Lab 09/07/2022 1900 08/30/2022 2014 08/27/2022 2319  WBC 17.9*  --   --   NEUTROABS 15.2*  --   --   HGB 10.3* 10.5* 10.9*  HCT 33.4* 31.0* 32.0*  MCV 102.5*  --   --   PLT 384  --   --     Basic Metabolic Panel: Recent Labs  Lab 09/05/2022 1900 09/13/2022 2014 09/18/2022 2319  NA 135 135 134*  K 3.0* 3.0* 3.4*  CL 100 98  --   CO2 22  --   --   GLUCOSE 96 81  --   BUN 43* 34*  --   CREATININE 4.73* 4.60*  --   CALCIUM 7.0*  --   --   MG 1.3*  --   --    GFR: CrCl cannot be calculated (Unknown ideal weight.). Recent Labs  Lab 08/30/2022 1900 09/18/2022 1910 09/20/2022 2046  WBC 17.9*  --   --   LATICACIDVEN  --  6.2* 6.2*    Liver Function Tests: Recent Labs  Lab 09/04/2022 1900  AST 37  ALT 11  ALKPHOS 310*  BILITOT 0.5  PROT 5.1*  ALBUMIN <1.5*   Recent Labs  Lab 09/04/2022 1900  LIPASE 19   No results for input(s): "AMMONIA" in the last 168 hours.  ABG    Component Value Date/Time   PHART 7.356 09/17/2022 2319   PCO2ART 34.5 09/06/2022 2319   PO2ART 116 (H) 09/01/2022 2319   HCO3 19.7 (L) 09/15/2022 2319   TCO2 21 (L) 08/27/2022 2319   ACIDBASEDEF 6.0 (H) 09/02/2022 2319   O2SAT 99 09/19/2022 2319     Coagulation Profile: Recent Labs  Lab 09/03/2022 1900  INR 1.7*    Cardiac Enzymes: No results for input(s): "CKTOTAL", "CKMB", "CKMBINDEX", "TROPONINI" in the last 168 hours.  HbA1C: Hgb A1c MFr Bld  Date/Time Value Ref Range Status  01/25/2021 05:45 PM 5.6 4.8 - 5.6 % Final    Comment:    (NOTE) Pre diabetes:          5.7%-6.4%  Diabetes:              >6.4%  Glycemic control for   <7.0% adults with diabetes   10/08/2017 12:29 AM 4.7 (L) 4.8 - 5.6 % Final    Comment:    (NOTE)         Prediabetes: 5.7 - 6.4         Diabetes: >6.4         Glycemic  control for adults with diabetes: <7.0     CBG: No results for input(s): "GLUCAP" in the last 168 hours.  Review of Systems:   Patient is encephalopathic and/or intubated. Therefore history has been obtained from chart review.    Past Medical History:  She,  has a past medical history of Abnormal gait, Anemia, Bipolar disorder ([place]), Depression, ESRD on peritoneal dialysis ([place]), ESRD on peritoneal dialysis ([place]), GERD (gastroesophageal reflux disease), Hyperlipemia, Hypertension, Hypothyroidism, Moderately mentally retarded, Schizophrenia ([place]), and Seizures ([place]).  Surgical History:   Past Surgical History:  Procedure Laterality Date   BREAST REDUCTION SURGERY     BREAST SURGERY     breast reduction   EXAMINATION UNDER ANESTHESIA N/A 12/08/2013   Procedure: EXAM UNDER ANESTHESIA;  Surgeon: Lavonia Drafts, MD;  Location: [facility];  Service: Gynecology;  Laterality: N/A;  Pelvic exam and pap smear, unable to tolerate during last office visit   FOOT SURGERY     right   IR FLUORO GUIDE CV LINE RIGHT  02/01/2021   IR REMOVAL TUN CV CATH W/O FL  03/15/2021   IR US GUIDE VASC ACCESS RIGHT  02/01/2021   LUMBAR LAMINECTOMY/DECOMPRESSION MICRODISCECTOMY N/A 01/25/2021   Procedure: Lumbar Five -Sacral One Laminectomy for Epidural Abscess;  Surgeon: Earnie Larsson, MD;  Location: [facility];  Service: Neurosurgery;  Laterality: N/A;   REDUCTION MAMMAPLASTY Bilateral      Social History:   reports that she has never smoked. She has never used smokeless tobacco. She reports that she does not drink alcohol and does not use drugs.   Family History:  Her family history includes Breast cancer (age of onset: 72) in her mother; Hypertension in an other family member.   Allergies Allergies  Allergen Reactions   Icodextrin Rash     Home Medications  Prior to Admission medications   Medication Sig Start Date End Date Taking? Authorizing Provider  cetirizine (ZYRTEC) 10 MG tablet Take 10 mg by  mouth daily. 07/13/22  Yes [provider]  albuterol (PROVENTIL HFA;VENTOLIN HFA) 108 (90 Base) MCG/ACT inhaler Inhale 2 puffs into the lungs every 6 (six) hours as needed for wheezing or shortness of breath.    [provider]  B Complex-C-Folic Acid (DIALYVITE 253) 0.8 MG TABS Take 0.8 mg by mouth daily.     [provider]  benztropine (COGENTIN) 1 MG tablet Take 1 mg by mouth at bedtime.    [provider]  divalproex (DEPAKOTE) 500 MG DR tablet Take 500 mg by mouth 2 (two) times daily. 08/02/21   [provider]  Docusate Sodium (DSS) 100 MG CAPS Take 100 mg by mouth daily as needed (constipation).    [provider]  FLUoxetine (PROZAC) 20 MG capsule Take 20 mg by mouth daily. 07/15/20   [provider]  hydrocortisone (ANUSOL-HC) 2.5 % rectal cream Place 1 Application rectally 2 (two) times daily. 06/28/22   Jeanell Sparrow, DO  iron polysaccharides (NIFEREX) 150 MG capsule Take 150 mg by mouth every other day.    [provider]  levETIRAcetam (KEPPRA) 500 MG tablet Take 1 tablet (500 mg total) by mouth 2 (two) times daily. 09/17/15   Charlynne Cousins, MD  levothyroxine (SYNTHROID, LEVOTHROID) 50 MCG tablet Take 50 mcg by mouth daily with breakfast. 10/05/17   [provider]  losartan (COZAAR) 50 MG tablet Take 50 mg by mouth daily as needed. >160 03/15/22   [provider]  medroxyPROGESTERone (DEPO-PROVERA) 150 MG/ML injection Inject 150 mg into the muscle every 3 (three) months.    [provider]  midodrine (PROAMATINE) 10 MG tablet Place 1 tablet (10 mg total) into feeding tube 3 (three) times daily with meals. 09/11/21   Danford, Suann Larry, MD  ondansetron (ZOFRAN) 4 MG tablet Take 1 tablet (4 mg total) by mouth every 4 (four) hours as needed for nausea or vomiting. 06/28/22   Jeanell Sparrow, DO  pantoprazole (PROTONIX) 20 MG tablet Take 1 tablet (20 mg total) by mouth daily  for 14 days.  06/28/22 07/12/22  [name] A, DO  potassium chloride SA (KLOR-CON M) 20 MEQ tablet Take 1 tablet (20 mEq total) by mouth daily with breakfast. Patient taking differently: Take 10 mEq by mouth daily with breakfast. 9/47/07   Delora Fuel, MD  risperiDONE (RISPERDAL) 3 MG tablet Take 3 mg by mouth at bedtime.    [provider]  sucralfate (CARAFATE) 1 g tablet Take 1 tablet (1 g total) by mouth with breakfast, with lunch, and with evening meal for 7 days. 06/28/22 07/05/22  Jeanell Sparrow, DO     Critical care time: 42 minutes     Georgann Housekeeper, AGACNP-BC [facility]  See Amion for personal pager PCCM on call pager [phone] until 7pm. Please call Elink 7p-7a. [phone]  08/29/2022 2:33 AM

## 2022-08-29 NOTE — Progress Notes (Signed)
RT placed pt in CPAP/PS 10/5. Pt did not tolerate well due to apenic, no patient effort. RT placed pt back in full ventilatory support in previous settings.

## 2022-08-29 NOTE — Anesthesia Postprocedure Evaluation (Signed)
Anesthesia Post Note  Patient: Jane [name]  Procedure(s) Performed: EXPLORATORY LAPAROTOMY MINOR REMOVAL OF PERITONEAL DIALYSIS CATHETER (Right: Abdomen)     Patient location during evaluation: PACU Anesthesia Type: General Level of consciousness: patient remains intubated per anesthesia plan Pain management: pain level controlled Vital Signs Assessment: vitals unstable Respiratory status: patient on ventilator - see flowsheet for VS, respiratory function unstable and patient remains intubated per anesthesia plan Cardiovascular status: tachycardic and unstable Postop Assessment: no apparent nausea or vomiting Anesthetic complications: no Comments: Patient taken to PACU intubated on pressors to maintain BP. She is to be transported to [facility] ICU for palliative care.   No notable events documented.  Last Vitals:  Vitals:   08/24/2022 2348 08/24/2022 2355  BP:  113/85  Pulse: (!) 110 (!) 108  Resp:  16  Temp:    SpO2: 100% 100%    Last Pain:  Vitals:   09/12/2022 2054  TempSrc: Rectal                 Oswald Pott A.

## 2022-08-29 NOTE — Progress Notes (Signed)
Pt tx to cone 3M03 by carelink with vent. Report given to Jenny Reichmann RN via phone.

## 2022-08-29 NOTE — Progress Notes (Signed)
PHARMACY - PHYSICIAN COMMUNICATION CRITICAL VALUE ALERT - BLOOD CULTURE IDENTIFICATION (BCID)  11/6 Blood>>1/4 Gram positive rods   Jane [name] is an 56 y.o. female who presented to [facility] on 09/02/2022 with a chief complaint of infarcted bowel  Name of physician (or Provider) Contacted: Dr. Lucile Shutters  Current antibiotics: Zosyn  Changes to prescribed antibiotics recommended:  None   Narda Bonds 08/29/2022  6:42 AM

## 2022-08-30 DIAGNOSIS — A419 Sepsis, unspecified organism: Secondary | ICD-10-CM | POA: Diagnosis not present

## 2022-08-30 DIAGNOSIS — R6521 Severe sepsis with septic shock: Secondary | ICD-10-CM | POA: Diagnosis not present

## 2022-08-30 DIAGNOSIS — N186 End stage renal disease: Secondary | ICD-10-CM | POA: Diagnosis not present

## 2022-08-30 DIAGNOSIS — J9601 Acute respiratory failure with hypoxia: Secondary | ICD-10-CM | POA: Diagnosis not present

## 2022-08-30 LAB — COMPREHENSIVE METABOLIC PANEL
ALT: 16 U/L (ref 0–44)
AST: 43 U/L — ABNORMAL HIGH (ref 15–41)
Albumin: <1.5 g/dL — ABNORMAL LOW (ref 3.5–5.0)
Alkaline Phosphatase: 251 U/L — ABNORMAL HIGH (ref 38–126)
Anion gap: 13 (ref 5–15)
BUN: 45 mg/dL — ABNORMAL HIGH (ref 6–20)
CO2: 15 mmol/L — ABNORMAL LOW (ref 22–32)
Calcium: 7.3 mg/dL — ABNORMAL LOW (ref 8.9–10.3)
Chloride: 101 mmol/L (ref 98–111)
Creatinine, Ser: 3.61 mg/dL — ABNORMAL HIGH (ref 0.44–1.00)
GFR, Estimated: 14 mL/min — ABNORMAL LOW (ref >60)
Glucose, Bld: 65 mg/dL — ABNORMAL LOW (ref 70–99)
Potassium: 4.3 mmol/L (ref 3.5–5.1)
Sodium: 129 mmol/L — ABNORMAL LOW (ref 135–145)
Total Bilirubin: 0.4 mg/dL (ref 0.3–1.2)
Total Protein: 4 g/dL — ABNORMAL LOW (ref 6.5–8.1)

## 2022-08-30 LAB — GLUCOSE, CAPILLARY
Glucose-Capillary: 147 mg/dL — ABNORMAL HIGH (ref 70–99)
Glucose-Capillary: 217 mg/dL — ABNORMAL HIGH (ref 70–99)
Glucose-Capillary: 63 mg/dL — ABNORMAL LOW (ref 70–99)

## 2022-08-30 LAB — CBC WITH DIFFERENTIAL/PLATELET
Abs Immature Granulocytes: 0.54 10*3/uL — ABNORMAL HIGH (ref 0.00–0.07)
Basophils Absolute: 0 10*3/uL (ref 0.0–0.1)
Basophils Relative: 0 %
Eosinophils Absolute: 0 10*3/uL (ref 0.0–0.5)
Eosinophils Relative: 0 %
HCT: 33.4 % — ABNORMAL LOW (ref 36.0–46.0)
Hemoglobin: 11.2 g/dL — ABNORMAL LOW (ref 12.0–15.0)
Immature Granulocytes: 3 %
Lymphocytes Relative: 8 %
Lymphs Abs: 1.4 10*3/uL (ref 0.7–4.0)
MCH: 31.7 pg (ref 26.0–34.0)
MCHC: 33.5 g/dL (ref 30.0–36.0)
MCV: 94.6 fL (ref 80.0–100.0)
Monocytes Absolute: 0.8 10*3/uL (ref 0.1–1.0)
Monocytes Relative: 4 %
Neutro Abs: 15.7 10*3/uL — ABNORMAL HIGH (ref 1.7–7.7)
Neutrophils Relative %: 85 %
Platelets: 278 10*3/uL (ref 150–400)
RBC: 3.53 MIL/uL — ABNORMAL LOW (ref 3.87–5.11)
RDW: 14.8 % (ref 11.5–15.5)
Smear Review: NORMAL
WBC: 18.5 10*3/uL — ABNORMAL HIGH (ref 4.0–10.5)
nRBC: 0 % (ref 0.0–0.2)

## 2022-08-30 LAB — LEGIONELLA PNEUMOPHILA SEROGP 1 UR AG

## 2022-08-30 LAB — URINE CULTURE: Culture: NO GROWTH

## 2022-08-30 LAB — TRIGLYCERIDES: Triglycerides: 239 mg/dL — ABNORMAL HIGH (ref <150)

## 2022-08-30 MED ORDER — DEXTROSE 50 % IV SOLN
12.5000 g | INTRAVENOUS | Status: AC
  Administered 2022-08-30: 12.5 g via INTRAVENOUS

## 2022-08-30 MED ORDER — DEXTROSE 10 % IV SOLN: INTRAVENOUS | Status: AC

## 2022-08-30 MED ORDER — DEXTROSE 50 % IV SOLN
INTRAVENOUS | Status: AC
  Filled 2022-08-30: qty 50

## 2022-08-30 MED ORDER — MIDAZOLAM-SODIUM CHLORIDE 100-0.9 MG/100ML-% IV SOLN
0.5000 mg/h | INTRAVENOUS | Status: DC
  Administered 2022-08-30: 4 mg/h via INTRAVENOUS
  Administered 2022-08-31: 5 mg/h via INTRAVENOUS
  Filled 2022-08-30 (×2): qty 100

## 2022-08-30 MED ORDER — SODIUM CHLORIDE 0.9 % IV SOLN: INTRAVENOUS | Status: DC

## 2022-08-30 NOTE — Progress Notes (Signed)
NAME:  Jane [name], MRN:  [id number], DOB:  [birth year], LOS: 2 ADMISSION DATE:  08/24/2022, CONSULTATION DATE:  08/29/2022 REFERRING MD:  Ninfa Linden - [facility] CHIEF COMPLAINT:  Ischemic bowel, hypotension  History of Present Illness:  56 year old woman who presented to [facility] ED 11/6 with abdominal pain, nausea/vomiting for several days. PMHx significant for HTN, HLD, ESRD on PD, hypothyroidism, seizures (not on AEDs), GERD, depression, bipolar disorder, schizophrenia, intellectual disability.  Upon ED arrival, patient was noted to be hypotensive and tachycardic.  She was given 2L of crystalloid for fluid resuscitation and was ultimately started on norepinephrine infusion.  CT A/P 11/6 demonstrated pneumatosis of the stomach, duodenum and entire small bowel with gas in the small bowel vasculature and diffuse portal venous gas throughout the liver.  With concern for bowel ischemia, patient was taken emergently to the operating room by Dr. Ninfa Linden. Unfortunately, in the OR she was found to have mesenteric ischemia with infarction of the entire small bowel.  Family was updated to the unsurvivable nature of her condition in the postoperative setting and she was transferred to the [facility] ICU for further care.  Patient was made DNR while awaiting additional family visitation prior to transition to comfort care.  Pertinent Medical History:   Past Medical History:  Diagnosis Date   Abnormal gait    Anemia    Bipolar disorder ([place])    Depression    ESRD on peritoneal dialysis ([place])    GERD (gastroesophageal reflux disease)    Hyperlipemia    Hypertension    Hypothyroidism    Moderately mentally retarded    Schizophrenia ([place])    Seizures ([place])    No recent seizures - no meds   Significant Hospital Events: Including procedures, antibiotic start and stop dates in addition to other pertinent events   11/6 Presented to [facility] with abdominal pain, CT concerning for bowel ischemia, emergently to the OR.   Found to have entire SB ischemia/infarct; deemed not survivable due to the degree of bowel involvement. BCx 1/4 +GPRs. Continues on Levo/vaso. Made DNR. 11/7 Ongoing hypotension despite max doses of Levophed, vasopressin. Bicarb pushes to maintain MAP. DNR, family at bedside with plan for transition to comfort care once all family members have had visitation.  Interim History / Subjective:   Remains critically ill, on Levophed at 70 mics and vasopressin. Sedated on propofol and fentanyl, on mechanical ventilation No urine output  Objective:  Blood pressure (!) 112/90, pulse (!) 106, temperature 99.3 F (37.4 C), resp. rate 18, height '5\' 5"'$  (1.651 m), weight 60.6 kg, SpO2 100 %.    Vent Mode: PRVC FiO2 (%):  [40 %] 40 % Set Rate:  [18 bmp] 18 bmp Vt Set:  [450 mL] 450 mL PEEP:  [5 cmH20] 5 cmH20 Plateau Pressure:  [16 cmH20-18 cmH20] 16 cmH20   Intake/Output Summary (Last 24 hours) at 08/30/2022 0900 Last data filed at 08/30/2022 0800 Gross per 24 hour  Intake 5194.48 ml  Output 595 ml  Net 4599.48 ml    Filed Weights   08/29/22 0500  Weight: 60.6 kg   Physical Examination: General: Chronically ill-appearing middle-aged woman in NAD. HEENT: [place]/AT, anicteric sclera, PERRL, moist mucous membranes. ETT/OGT in place. Neuro: Sedated. Not following commands.  CV: Tachycardic to 110s, regular rhythm, no m/g/r. PULM: Clear to auscultation bilateral, no accessory muscle use GI: Soft, not apparently TTP, mildly distended. Hypoactive bowel sounds. Extremities: Bilateral symmetric trace LE edema noted, + dependent edema of BUE. Skin: Warm/dry, no rashes.  Chest x-ray shows tubes in position, bibasilar infiltrates  Resolved Hospital Problem List:     Assessment & Plan:   Small bowel infarct Per surgical team, infarction of the entire small bowel from the ligament of Treitz to the terminal ileum.  Unsurvivable injury. - Patient made DNR 11/6 -No further interventions - Continue  Propofol/Fentanyl gtt for comfort  Distributive shock Septic shock secondary to above. - Goal MAP > 65 - Fluid resuscitated - Levophed, Vasopressin-cap At current levels - Continue Zosyn  Acute respiratory failure with hypoxia - Continue full vent support (4-8cc/kg IBW) - Wean FiO2 for O2 sat > 90% - VAP bundle - Pulmonary hygiene - PAD protocol for sedation: Propofol and Fentanyl for goal RASS -1 to -2; focus on comfort care  ESRD on PD Hypokalemia Hypomag - PD catheter removed intraoperatively as patient will be transitioning to comfort care  Hx seizure: no recent sz. No home meds - ICU monitoring  [place] - Patient made DNR 11/6 in the setting of nonsurvivable bowel ischemia/injury - Plan for transition to comfort care once family has had opportunity to visit -Sister is however hesitant to withdraw life support , hoping that she would not have to make this decision.  I explained that tubes may be causing further discomfort. - Anticipate in-hospital death  Best Practice (right click and "Reselect all SmartList Selections" daily)   Diet/type: NPO DVT prophylaxis: other GI prophylaxis: PPI Lines: Central line Foley:  Yes, and it is still needed Code Status:  DNR Last date of multidisciplinary goals of care discussion [ 11/7 ]  Critical care time: 19 m   The patient is critically ill with multiple organ system failure and requires high complexity decision making for assessment and support, frequent evaluation and titration of therapies, advanced monitoring, review of radiographic studies and interpretation of complex data.   Critical Care Time devoted to patient care services, exclusive of separately billable procedures, described in this note is 37 minutes.  Leanna Sato Elsworth Soho, MD [facility] 08/30/22 9:00 AM  Please see [email or web address] for pager details.  From 7A-7P if no response, please call [phone] After hours, please call ELink [phone]

## 2022-08-30 NOTE — Progress Notes (Signed)
[facility] Progress Note Patient Name: Jane [name] DOB: Mar 27, 1966 MRN: [id number]   Date of Service  08/30/2022  HPI/Events of Note  Hypoglycemia requiring D 50 while on D 5 LR at 100 ml / hour, serum sodium 130.  eICU Interventions  D 5 LR discontinued. NS at 50 ml / hour + D 10 % at 50 ml / hour substituted.        Mareesa Gathright U Taliesin Hartlage 08/30/2022, 12:30 AM

## 2022-08-30 NOTE — Progress Notes (Signed)
eLink Physician-Brief Progress Note Patient Name: SHAKEITA VANDEVANDER DOB: [birth year] MRN: [id number]   Date of Service  08/30/2022  HPI/Events of Note  Blood sugar drifting back down into the 60's.  eICU Interventions  D 10 % water gtt increased to 75 ml / hour.        Kerry Kass Shamanda Len 08/30/2022, 4:17 AM

## 2022-08-31 DIAGNOSIS — J9601 Acute respiratory failure with hypoxia: Secondary | ICD-10-CM | POA: Diagnosis not present

## 2022-08-31 DIAGNOSIS — R6521 Severe sepsis with septic shock: Secondary | ICD-10-CM | POA: Diagnosis not present

## 2022-08-31 DIAGNOSIS — N186 End stage renal disease: Secondary | ICD-10-CM | POA: Diagnosis not present

## 2022-08-31 DIAGNOSIS — A419 Sepsis, unspecified organism: Secondary | ICD-10-CM | POA: Diagnosis not present

## 2022-08-31 LAB — CULTURE, BLOOD (ROUTINE X 2)

## 2022-08-31 LAB — LEVETIRACETAM LEVEL: Levetiracetam Lvl: 19.5 ug/mL (ref 10.0–40.0)

## 2022-08-31 LAB — GLUCOSE, CAPILLARY: Glucose-Capillary: 23 mg/dL — CL (ref 70–99)

## 2022-08-31 MED ORDER — ACETAMINOPHEN 650 MG RE SUPP: 650.0000 mg | Freq: Four times a day (QID) | RECTAL | Status: DC | PRN

## 2022-08-31 MED ORDER — POLYVINYL ALCOHOL 1.4 % OP SOLN: 1.0000 [drp] | Freq: Four times a day (QID) | OPHTHALMIC | Status: DC | PRN

## 2022-08-31 MED ORDER — MORPHINE SULFATE (PF) 2 MG/ML IV SOLN
2.0000 mg | INTRAVENOUS | Status: DC | PRN
  Administered 2022-08-31: 4 mg via INTRAVENOUS
  Administered 2022-08-31: 2 mg via INTRAVENOUS
  Filled 2022-08-31: qty 2
  Filled 2022-08-31: qty 1

## 2022-08-31 MED ORDER — GLYCOPYRROLATE 0.2 MG/ML IJ SOLN: 0.2000 mg | INTRAMUSCULAR | Status: DC | PRN

## 2022-08-31 MED ORDER — SODIUM CHLORIDE 0.9 % IV SOLN: INTRAVENOUS | Status: DC

## 2022-08-31 MED ORDER — ACETAMINOPHEN 325 MG PO TABS: 650.0000 mg | ORAL_TABLET | Freq: Four times a day (QID) | ORAL | Status: DC | PRN

## 2022-08-31 MED ORDER — GLYCOPYRROLATE 1 MG PO TABS: 1.0000 mg | ORAL_TABLET | ORAL | Status: DC | PRN

## 2022-08-31 MED ORDER — DEXTROSE 10 % IV SOLN: INTRAVENOUS | Status: DC

## 2022-09-02 LAB — CULTURE, BLOOD (ROUTINE X 2): Culture: NO GROWTH

## 2022-09-22 NOTE — Progress Notes (Signed)
Pt. Expired at 1338, pronounced by 2 RN. Family at bedside. MD made aware

## 2022-09-22 NOTE — Procedures (Signed)
Extubation Procedure Note  Patient Details:   Name: Jane [name] DOB: 1966-04-25 MRN: [id number]   Airway Documentation:    Vent end date: 09/16/22 Vent end time: 1215   Evaluation  O2 sats: currently acceptable Complications: No apparent complications Patient did tolerate procedure well. Bilateral Breath Sounds: Clear, Diminished   No  Pt extubated per physician order and family request to comfort care measures. Pt suctioned via ETT and orally prior. Family remained at bedside. RT will continue to be available as needed.   Sharla Kidney Sep 16, 2022, 12:16 PM

## 2022-09-22 NOTE — Death Summary Note (Signed)
   DEATH SUMMARY   Patient Details  Name: Jane [name] MRN: [id number] DOB: 10-May-1966 [id number], [name], MD  Admission/Discharge Information   Admit Date:  09/22/22  Date of Death: 25-Sep-2022  Time of Death:   Jan 27, 1337  Length of Stay: 3   Principle Cause of death: Septic shock today ischemic bowel Comorbidities -end-stage renal disease on peritoneal dialysis , schizophrenia  History of Present Illness:  56 year old woman who presented to [facility] ED 09/23/23 with abdominal pain, nausea/vomiting for several days. PMHx significant for HTN, HLD, ESRD on PD, hypothyroidism, seizures (not on AEDs), GERD, depression, bipolar disorder, schizophrenia, intellectual disability.   Upon ED arrival, patient was noted to be hypotensive and tachycardic.  She was given 2L of crystalloid for fluid resuscitation and was ultimately started on norepinephrine infusion.  CT A/P 09/23/2023 demonstrated pneumatosis of the stomach, duodenum and entire small bowel with gas in the small bowel vasculature and diffuse portal venous gas throughout the liver.  With concern for bowel ischemia, patient was taken emergently to the operating room by Dr. Ninfa Linden. Unfortunately, in the OR she was found to have mesenteric ischemia with infarction of the entire small bowel.  Family was updated to the unsurvivable nature of her condition in the postoperative setting and she was transferred to the [facility] ICU for further care.   Patient was made DNR while awaiting additional family visitation prior to transition to comfort care.  Hospital Diagnoses: Principal Problem:   Sepsis ([place]) Active Problems:   ESRD on peritoneal dialysis ([place])   Septic shock ([place])   Ischemic necrosis of small bowel ([place])   Bowel infarction ([place])   Acute respiratory failure with hypoxia [facility])   Hospital Course: Significant Hospital Events: Including procedures, antibiotic start and stop dates in addition to other pertinent events   09-23-2023 Presented to [facility]  with abdominal pain, CT concerning for bowel ischemia, emergently to the OR.  Found to have entire SB ischemia/infarct; deemed not survivable due to the degree of bowel involvement. BCx 1/4 +GPRs. Continues on Levo/vaso. Made DNR. 11/7 Ongoing hypotension despite max doses of Levophed, vasopressin. Bicarb pushes to maintain MAP. DNR, family at bedside with plan for transition to comfort care once all family members have had visitation. 2023/09/26 transition to comfort care, life support was withdrawn and she passed away soon after    Signed: Leanna Sato. Elsworth Soho, MD September 25, 2022

## 2022-09-22 NOTE — Progress Notes (Signed)
eLink Physician-Brief Progress Note Patient Name: Jane [name] DOB: 1966-04-13 MRN: [id number]   Date of Service  09-09-2022  HPI/Events of Note  Nursing request to renew order for D10W IV fluid. Patient is still having episodes of hypoglycemia into the 60's/  eICU Interventions  Plan: Will renew order for D10W to run at 100 mL/hour.      Intervention Category Major Interventions: Other:  Johnthomas Lader Cornelia Copa 09/09/22, 4:06 AM

## 2022-09-22 NOTE — Progress Notes (Signed)
   September 26, 2022 1154  Clinical Encounter Type  Visited With Family  Visit Type Initial (EOL)  Referral From Nurse  Consult/Referral To Chaplain   Chaplain responded to a call for support of family. The patient, Jane [name] was about to be extubated.  Chaplain gathered with family around Jane [name]'s bed for prayer. Praying for Jane [name]'s passing into eternal rest and no more hurt. Family was relaxed and united in their decision this was the time to let the patient go.   I advised the nurse that we were done and the family was ready for the extubation.   Danice Goltz [facility]  [phone]

## 2022-09-22 NOTE — Progress Notes (Signed)
NAME:  Jane [name], MRN:  [id number], DOB:  03-Dec-1965, LOS: 3 ADMISSION DATE:  09/01/2022, CONSULTATION DATE:  08/29/2022 REFERRING MD:  Ninfa Linden - [facility] CHIEF COMPLAINT:  Ischemic bowel, hypotension  History of Present Illness:  56 year old woman who presented to [facility] ED 11/6 with abdominal pain, nausea/vomiting for several days. PMHx significant for HTN, HLD, ESRD on PD, hypothyroidism, seizures (not on AEDs), GERD, depression, bipolar disorder, schizophrenia, intellectual disability.  Upon ED arrival, patient was noted to be hypotensive and tachycardic.  She was given 2L of crystalloid for fluid resuscitation and was ultimately started on norepinephrine infusion.  CT A/P 11/6 demonstrated pneumatosis of the stomach, duodenum and entire small bowel with gas in the small bowel vasculature and diffuse portal venous gas throughout the liver.  With concern for bowel ischemia, patient was taken emergently to the operating room by Dr. Ninfa Linden. Unfortunately, in the OR she was found to have mesenteric ischemia with infarction of the entire small bowel.  Family was updated to the unsurvivable nature of her condition in the postoperative setting and she was transferred to the [facility] ICU for further care.  Patient was made DNR while awaiting additional family visitation prior to transition to comfort care.  Pertinent Medical History:   Past Medical History:  Diagnosis Date   Abnormal gait    Anemia    Bipolar disorder ([place])    Depression    ESRD on peritoneal dialysis ([place])    GERD (gastroesophageal reflux disease)    Hyperlipemia    Hypertension    Hypothyroidism    Moderately mentally retarded    Schizophrenia ([place])    Seizures ([place])    No recent seizures - no meds   Significant Hospital Events: Including procedures, antibiotic start and stop dates in addition to other pertinent events   11/6 Presented to [facility] with abdominal pain, CT concerning for bowel ischemia, emergently to the OR.   Found to have entire SB ischemia/infarct; deemed not survivable due to the degree of bowel involvement. BCx 1/4 +GPRs. Continues on Levo/vaso. Made DNR. 11/7 Ongoing hypotension despite max doses of Levophed, vasopressin. Bicarb pushes to maintain MAP. DNR, family at bedside with plan for transition to comfort care once all family members have had visitation.  Interim History / Subjective:   Remains critically ill, on Levophed at 68 mics and vasopressin. Appears comfortable on Versed and fentanyl drips, No urine output   Objective:  Blood pressure (!) 83/62, pulse (!) 118, temperature 99.7 F (37.6 C), resp. rate 18, height '5\' 5"'$  (1.651 m), weight 70.9 kg, SpO2 97 %.    Vent Mode: PRVC FiO2 (%):  [30 %] 30 % Set Rate:  [18 bmp] 18 bmp Vt Set:  [450 mL] 450 mL PEEP:  [5 cmH20] 5 cmH20 Plateau Pressure:  [18 cmH20] 18 cmH20   Intake/Output Summary (Last 24 hours) at September 09, 2022 0941 Last data filed at 2022-09-09 0800 Gross per 24 hour  Intake 3905.73 ml  Output 290 ml  Net 3615.73 ml    Filed Weights   08/29/22 0500 09-09-22 0500  Weight: 60.6 kg 70.9 kg   Physical Examination: General: Chronically ill-appearing middle-aged woman in NAD. HEENT: [place]/AT, anicteric sclera, PERRL, moist mucous membranes. ETT/OGT in place. Neuro: Sedated. Not following commands.  CV: S1-S2 tach. PULM: No accessory muscle use, clear breath sounds bilateral GI: Soft, not apparently TTP, mildly distended. Hypoactive bowel sounds. Extremities: Bilateral symmetric trace LE edema noted, + dependent edema of BUE. Skin: Cold extremities, no cyanosis  Chest x-ray shows tubes in position, bibasilar infiltrates  Resolved Hospital Problem List:     Assessment & Plan:   Small bowel infarct Per surgical team, infarction of the entire small bowel from the ligament of Treitz to the terminal ileum.  Unsurvivable injury. - DNR 11/6 -No further interventions - Continue versed/Fentanyl gtt for  comfort  Distributive shock Septic shock secondary to above. - Goal MAP > 65 - Levophed, Vasopressin-cap At current levels -no titration - Continue Zosyn  Acute respiratory failure with hypoxia - Continue full vent support (4-8cc/kg IBW) - VAP bundle   ESRD on PD Hypokalemia Hypomag - PD catheter removed intraoperatively   Hx seizure: no recent sz. No home meds - ICU monitoring  [place] - Patient made DNR 11/6 in the setting of nonsurvivable bowel ischemia/injury - Plan for transition to comfort care  -Sister is however hesitant to withdraw life support , dad and brother seem to be ready, will continue dose discussion today.  Clearly we are prolonging suffering here - Anticipate in-hospital death  Best Practice (right click and "Reselect all SmartList Selections" daily)   Diet/type: NPO DVT prophylaxis: other GI prophylaxis: PPI Lines: Central line Foley:  Yes, and it is still needed Code Status:  DNR Last date of multidisciplinary goals of care discussion [ 11/7 ]  Critical care time: 63 m   The patient is critically ill with multiple organ system failure and requires high complexity decision making for assessment and support, frequent evaluation and titration of therapies, advanced monitoring, review of radiographic studies and interpretation of complex data.   Critical Care Time devoted to patient care services, exclusive of separately billable procedures, described in this note is 37 minutes.  Leanna Sato Elsworth [place], [facility] 09/23/2022 9:41 AM  Please see [email or web address] for pager details.  From 7A-7P if no response, please call [phone] After hours, please call ELink [phone]

## 2022-09-22 DEATH — deceased

## 2022-10-03 ENCOUNTER — Ambulatory Visit: Payer: Medicare Other | Admitting: Nurse Practitioner
